# Patient Record
Sex: Male | Born: 1980 | Race: Black or African American | Hispanic: No | Marital: Single | Smoking: Never smoker
Health system: Southern US, Community
[De-identification: ages and names within clinical notes are randomized; demographics above are authoritative.]

## PROBLEM LIST (undated history)

## (undated) DIAGNOSIS — N052 Unspecified nephritic syndrome with diffuse membranous glomerulonephritis: Secondary | ICD-10-CM

## (undated) DIAGNOSIS — I10 Essential (primary) hypertension: Secondary | ICD-10-CM

## (undated) DIAGNOSIS — E119 Type 2 diabetes mellitus without complications: Secondary | ICD-10-CM

## (undated) DIAGNOSIS — N186 End stage renal disease: Secondary | ICD-10-CM

## (undated) DIAGNOSIS — I351 Nonrheumatic aortic (valve) insufficiency: Secondary | ICD-10-CM

## (undated) DIAGNOSIS — F32A Depression, unspecified: Secondary | ICD-10-CM

## (undated) DIAGNOSIS — R112 Nausea with vomiting, unspecified: Secondary | ICD-10-CM

## (undated) DIAGNOSIS — Z9889 Other specified postprocedural states: Secondary | ICD-10-CM

## (undated) DIAGNOSIS — I509 Heart failure, unspecified: Secondary | ICD-10-CM

## (undated) DIAGNOSIS — I428 Other cardiomyopathies: Secondary | ICD-10-CM

## (undated) DIAGNOSIS — N492 Inflammatory disorders of scrotum: Secondary | ICD-10-CM

## (undated) DIAGNOSIS — R079 Chest pain, unspecified: Secondary | ICD-10-CM

## (undated) DIAGNOSIS — D631 Anemia in chronic kidney disease: Secondary | ICD-10-CM

## (undated) DIAGNOSIS — D62 Acute posthemorrhagic anemia: Secondary | ICD-10-CM

## (undated) DIAGNOSIS — K219 Gastro-esophageal reflux disease without esophagitis: Secondary | ICD-10-CM

## (undated) DIAGNOSIS — L02214 Cutaneous abscess of groin: Secondary | ICD-10-CM

## (undated) DIAGNOSIS — E785 Hyperlipidemia, unspecified: Secondary | ICD-10-CM

## (undated) DIAGNOSIS — J45909 Unspecified asthma, uncomplicated: Secondary | ICD-10-CM

## (undated) DIAGNOSIS — Z8489 Family history of other specified conditions: Secondary | ICD-10-CM

## (undated) DIAGNOSIS — N189 Chronic kidney disease, unspecified: Secondary | ICD-10-CM

## (undated) HISTORY — DX: Chest pain, unspecified: R07.9

## (undated) HISTORY — DX: Acute posthemorrhagic anemia: D62

## (undated) HISTORY — DX: Essential (primary) hypertension: I10

## (undated) HISTORY — DX: Morbid (severe) obesity due to excess calories: E66.01

## (undated) HISTORY — DX: Anemia in chronic kidney disease: D63.1

## (undated) HISTORY — DX: Other cardiomyopathies: I42.8

## (undated) HISTORY — DX: Hyperlipidemia, unspecified: E78.5

## (undated) HISTORY — DX: Chronic kidney disease, unspecified: N18.9

## (undated) HISTORY — DX: Cutaneous abscess of groin: L02.214

## (undated) HISTORY — DX: Unspecified nephritic syndrome with diffuse membranous glomerulonephritis: N05.2

## (undated) HISTORY — DX: Type 2 diabetes mellitus without complications: E11.9

## (undated) HISTORY — DX: Inflammatory disorders of scrotum: N49.2

---

## 2004-02-02 ENCOUNTER — Emergency Department (HOSPITAL_COMMUNITY): Admission: EM | Admit: 2004-02-02 | Discharge: 2004-02-02 | Payer: Self-pay | Admitting: Family Medicine

## 2004-02-15 ENCOUNTER — Inpatient Hospital Stay (HOSPITAL_COMMUNITY): Admission: EM | Admit: 2004-02-15 | Discharge: 2004-02-18 | Payer: Self-pay | Admitting: Emergency Medicine

## 2004-02-26 ENCOUNTER — Encounter: Admission: RE | Admit: 2004-02-26 | Discharge: 2004-02-26 | Payer: Self-pay | Admitting: Internal Medicine

## 2004-03-16 ENCOUNTER — Emergency Department (HOSPITAL_COMMUNITY): Admission: EM | Admit: 2004-03-16 | Discharge: 2004-03-16 | Payer: Self-pay | Admitting: Internal Medicine

## 2004-03-29 ENCOUNTER — Encounter: Admission: RE | Admit: 2004-03-29 | Discharge: 2004-03-29 | Payer: Self-pay | Admitting: Internal Medicine

## 2004-06-20 ENCOUNTER — Ambulatory Visit: Payer: Self-pay | Admitting: Internal Medicine

## 2004-07-12 ENCOUNTER — Ambulatory Visit: Payer: Self-pay | Admitting: Internal Medicine

## 2005-06-30 ENCOUNTER — Inpatient Hospital Stay (HOSPITAL_COMMUNITY): Admission: EM | Admit: 2005-06-30 | Discharge: 2005-07-03 | Payer: Self-pay | Admitting: Emergency Medicine

## 2005-06-30 ENCOUNTER — Ambulatory Visit: Payer: Self-pay | Admitting: Infectious Diseases

## 2005-07-01 ENCOUNTER — Encounter (INDEPENDENT_AMBULATORY_CARE_PROVIDER_SITE_OTHER): Payer: Self-pay | Admitting: Cardiology

## 2006-06-07 ENCOUNTER — Emergency Department (HOSPITAL_COMMUNITY): Admission: EM | Admit: 2006-06-07 | Discharge: 2006-06-07 | Payer: Self-pay | Admitting: Emergency Medicine

## 2006-12-20 ENCOUNTER — Emergency Department (HOSPITAL_COMMUNITY): Admission: EM | Admit: 2006-12-20 | Discharge: 2006-12-20 | Payer: Self-pay | Admitting: Emergency Medicine

## 2008-08-19 ENCOUNTER — Emergency Department (HOSPITAL_COMMUNITY): Admission: EM | Admit: 2008-08-19 | Discharge: 2008-08-19 | Payer: Self-pay | Admitting: Emergency Medicine

## 2009-03-23 ENCOUNTER — Emergency Department (HOSPITAL_COMMUNITY): Admission: EM | Admit: 2009-03-23 | Discharge: 2009-03-24 | Payer: Self-pay | Admitting: Emergency Medicine

## 2010-12-15 LAB — DIFFERENTIAL
Basophils Absolute: 0 10*3/uL (ref 0.0–0.1)
Basophils Relative: 0 % (ref 0–1)
Eosinophils Absolute: 0.1 10*3/uL (ref 0.0–0.7)
Eosinophils Relative: 1 % (ref 0–5)
Lymphocytes Relative: 19 % (ref 12–46)
Lymphs Abs: 2.3 10*3/uL (ref 0.7–4.0)
Monocytes Absolute: 0.9 10*3/uL (ref 0.1–1.0)
Monocytes Relative: 7 % (ref 3–12)
Neutro Abs: 8.9 10*3/uL — ABNORMAL HIGH (ref 1.7–7.7)
Neutrophils Relative %: 73 % (ref 43–77)

## 2010-12-15 LAB — BASIC METABOLIC PANEL
BUN: 9 mg/dL (ref 6–23)
CO2: 25 mEq/L (ref 19–32)
Calcium: 9.1 mg/dL (ref 8.4–10.5)
Chloride: 101 mEq/L (ref 96–112)
Creatinine, Ser: 0.9 mg/dL (ref 0.4–1.5)
GFR calc Af Amer: >60 mL/min (ref >60)
GFR calc non Af Amer: >60 mL/min (ref >60)
Glucose, Bld: 386 mg/dL — ABNORMAL HIGH (ref 70–99)
Potassium: 3.8 mEq/L (ref 3.5–5.1)
Sodium: 135 mEq/L (ref 135–145)

## 2010-12-15 LAB — CBC
HCT: 44.7 % (ref 39.0–52.0)
Hemoglobin: 14.6 g/dL (ref 13.0–17.0)
MCHC: 32.8 g/dL (ref 30.0–36.0)
MCV: 81 fL (ref 78.0–100.0)
Platelets: 280 10*3/uL (ref 150–400)
RBC: 5.51 MIL/uL (ref 4.22–5.81)
RDW: 13.4 % (ref 11.5–15.5)
WBC: 12.2 10*3/uL — ABNORMAL HIGH (ref 4.0–10.5)

## 2010-12-21 ENCOUNTER — Inpatient Hospital Stay (INDEPENDENT_AMBULATORY_CARE_PROVIDER_SITE_OTHER)
Admission: RE | Admit: 2010-12-21 | Discharge: 2010-12-21 | Disposition: A | Discharge status: Home / Self Care | Payer: Self-pay | Source: Ambulatory Visit | Attending: Family Medicine | Admitting: Family Medicine

## 2010-12-21 DIAGNOSIS — L0231 Cutaneous abscess of buttock: Secondary | ICD-10-CM

## 2010-12-21 LAB — POCT URINALYSIS DIP (DEVICE)
Bilirubin Urine: NEGATIVE
Glucose, UA: 500 mg/dL — AB
Hgb urine dipstick: NEGATIVE
Protein, ur: NEGATIVE mg/dL
Specific Gravity, Urine: 1.02 (ref 1.005–1.030)
Urobilinogen, UA: 0.2 mg/dL (ref 0.0–1.0)

## 2010-12-21 LAB — POCT I-STAT, CHEM 8
BUN: 13 mg/dL (ref 6–23)
Calcium, Ion: 1.08 mmol/L — ABNORMAL LOW (ref 1.12–1.32)
Chloride: 98 mEq/L (ref 96–112)
Creatinine, Ser: 1.2 mg/dL (ref 0.4–1.5)
Glucose, Bld: 328 mg/dL — ABNORMAL HIGH (ref 70–99)
Hemoglobin: 16 g/dL (ref 13.0–17.0)
Potassium: 4.1 mEq/L (ref 3.5–5.1)
Sodium: 137 mEq/L (ref 135–145)
TCO2: 28 mmol/L (ref 0–100)

## 2010-12-21 LAB — GLUCOSE, CAPILLARY: Glucose-Capillary: 316 mg/dL — ABNORMAL HIGH (ref 70–99)

## 2010-12-24 ENCOUNTER — Inpatient Hospital Stay (HOSPITAL_COMMUNITY)
Admission: RE | Admit: 2010-12-24 | Discharge: 2010-12-24 | Disposition: A | Discharge status: Home / Self Care | Payer: Self-pay | Source: Ambulatory Visit | Attending: Family Medicine | Admitting: Family Medicine

## 2010-12-24 LAB — CULTURE, ROUTINE-ABSCESS

## 2011-01-18 ENCOUNTER — Inpatient Hospital Stay (INDEPENDENT_AMBULATORY_CARE_PROVIDER_SITE_OTHER)
Admission: RE | Admit: 2011-01-18 | Discharge: 2011-01-18 | Disposition: A | Discharge status: Home / Self Care | Payer: Self-pay | Source: Ambulatory Visit | Attending: Emergency Medicine | Admitting: Emergency Medicine

## 2011-01-18 ENCOUNTER — Emergency Department (HOSPITAL_COMMUNITY)
Admission: EM | Admit: 2011-01-18 | Discharge: 2011-01-19 | Disposition: A | Discharge status: Home / Self Care | Payer: Self-pay | Attending: Emergency Medicine | Admitting: Emergency Medicine

## 2011-01-18 DIAGNOSIS — E119 Type 2 diabetes mellitus without complications: Secondary | ICD-10-CM | POA: Insufficient documentation

## 2011-01-18 DIAGNOSIS — R1909 Other intra-abdominal and pelvic swelling, mass and lump: Secondary | ICD-10-CM | POA: Insufficient documentation

## 2011-01-18 DIAGNOSIS — I10 Essential (primary) hypertension: Secondary | ICD-10-CM | POA: Insufficient documentation

## 2011-01-18 DIAGNOSIS — R109 Unspecified abdominal pain: Secondary | ICD-10-CM | POA: Insufficient documentation

## 2011-01-18 DIAGNOSIS — L02219 Cutaneous abscess of trunk, unspecified: Secondary | ICD-10-CM | POA: Insufficient documentation

## 2011-01-19 LAB — DIFFERENTIAL
Basophils Absolute: 0 10*3/uL (ref 0.0–0.1)
Basophils Relative: 0 % (ref 0–1)
Eosinophils Absolute: 0 10*3/uL (ref 0.0–0.7)
Eosinophils Relative: 0 % (ref 0–5)
Lymphocytes Relative: 10 % — ABNORMAL LOW (ref 12–46)
Lymphs Abs: 1.8 10*3/uL (ref 0.7–4.0)
Neutro Abs: 15.1 10*3/uL — ABNORMAL HIGH (ref 1.7–7.7)
Neutrophils Relative %: 81 % — ABNORMAL HIGH (ref 43–77)

## 2011-01-19 LAB — CBC
Hemoglobin: 13 g/dL (ref 13.0–17.0)
MCH: 26 pg (ref 26.0–34.0)
MCV: 78.4 fL (ref 78.0–100.0)
Platelets: 222 10*3/uL (ref 150–400)
RBC: 5 MIL/uL (ref 4.22–5.81)
RDW: 14.1 % (ref 11.5–15.5)
WBC: 18.6 10*3/uL — ABNORMAL HIGH (ref 4.0–10.5)

## 2011-01-19 LAB — BASIC METABOLIC PANEL
BUN: 6 mg/dL (ref 6–23)
CO2: 24 mEq/L (ref 19–32)
Calcium: 9.2 mg/dL (ref 8.4–10.5)
Chloride: 98 mEq/L (ref 96–112)
GFR calc Af Amer: >60 mL/min (ref >60)
GFR calc non Af Amer: >60 mL/min (ref >60)
Glucose, Bld: 255 mg/dL — ABNORMAL HIGH (ref 70–99)
Potassium: 3.4 mEq/L — ABNORMAL LOW (ref 3.5–5.1)
Sodium: 133 mEq/L — ABNORMAL LOW (ref 135–145)

## 2011-01-21 NOTE — Op Note (Signed)
  NAMEPHYLLIS, Jeremy [name]             ACCOUNT NO.:  [id number]  MEDICAL RECORD NO.:  [id number]           PATIENT TYPE:  LOCATION:                                 FACILITY:  PHYSICIAN:  Joyice Faster. Samnang Shugars, M.D.DATE OF BIRTH:  Jan 08, 1981  DATE OF PROCEDURE:  01/19/2011 DATE OF DISCHARGE:                              OPERATIVE REPORT   PREOPERATIVE DIAGNOSIS:  Abscess, right groin.  POSTOPERATIVE DIAGNOSIS:  Abscess, right groin.  PROCEDURE:  Incision and drainage of abscess to right groin.  SURGEON:  Marcello Moores A. Kalaysia Demonbreun, MD  ANESTHESIA:  A 4 mg of Versed IV, 2 mg of morphine with conscious sedation with 10 mL of 2% lidocaine local.  ESTIMATED BLOOD LOSS:  Minimal.  SPECIMEN:  None.  INDICATIONS FOR PROCEDURE:  The patient is a 30 year old male who has had some swelling at the inner aspect of his right thigh at scrotal junction.  Upon examination, it was fluctuant and tender and this was red.  He had no evidence of inguinal hernias.  I recommended incision and drainage.  Ultrasound was done with EDP which showed multiloculated abscess in this area.  Under sterile conditions after getting informed consent from the patient, the skin was prepped with Betadine.  He received conscious sedation as outlined above.  A 2% lidocaine was used to inject into the skin.  A crease incision was made and copious amounts of pus and foul-smelling fluid were removed.  I then packed the cavity with quarter-inch Iodoform packing without difficulty.  Hemostasis was achieved.  He tolerated the procedure well.  There were no immediate complications.  He will follow up next week to have his pack removed in the office.     [name] A. Crixus Mcaulay, M.D.     [name]  D:  01/19/2011  T:  01/19/2011  Job:  [id number]  cc:   [facility]  Electronically Signed by Erroll Luna M.D. on 01/21/2011 01:46:25 PM

## 2011-01-21 NOTE — Consult Note (Signed)
  Jeremy [name], Jeremy [name]             ACCOUNT NO.:  [id number]  MEDICAL RECORD NO.:  [id number]           PATIENT TYPE:  E  LOCATION:  [facility]                         FACILITY:  [facility]  PHYSICIAN:  Tacoma Merida A. Sherol Sabas, M.D.DATE OF BIRTH:  1980/12/15  DATE OF CONSULTATION:  01/19/2011 DATE OF DISCHARGE:  01/19/2011                                CONSULTATION   REQUESTING PHYSICIAN:  Tiffany Kocher L. Eulis Foster, MD  CHIEF COMPLAINT:  Right groin swelling.  HISTORY OF PRESENT ILLNESS:  The patient is a 30 year old male with diabetes and hypertension.  He has had a 3-day history of progressive pain and swelling in his right groin where the scrotal skin attaches to the inner thigh.  It has been red, tender, swollen, uncomfortable and getting more so over last 24 hours.  He has had low-grade fever at home of 100.6.  No nausea or vomiting.  No arthralgias.  PAST MEDICAL HISTORY:  Diabetes and hypertension.  PAST SURGICAL HISTORY:  None.  FAMILY HISTORY:  Noncontributory.  SOCIAL HISTORY:  Positive for alcohol use.  Denies tobacco use.  REVIEW OF SYSTEMS:  Negative x15 points.  MEDICATIONS:  Lisinopril and glipizide.  ALLERGIES:  None.  PHYSICAL EXAMINATION:  VITAL SIGNS:  Temperature 100.6, blood pressure 152/96, heart rate 110. GENERAL APPEARANCE:  A pleasant male in no apparent distress. HEENT:  No jaundice.  Oropharynx moist. NECK:  Supple and nontender.  Full range of motion. GENITOURINARY:  Skin at the base of his right scrotum on the inner aspect is a 3-cm red, fluctuant, tender area.  I talk about this with the patient, recommend incision and drainage at bedside, and the patient agreed.  Risk of bleeding, infection, and need for further surgery and damage to neighboring structures.  IMPRESSION:  Right groin abscess.  PLAN:  We will plan I and D in the emergency room with conscious sedation.  Risks, benefits, and alternative therapies were discussed with the patient.  He understood  the above and wished to proceed.     Laasia Arcos A. Sadia Belfiore, M.D.     [name]  D:  01/19/2011  T:  01/19/2011  Job:  [id number]  cc:   [facility]  Electronically Signed by Erroll Luna M.D. on 01/21/2011 01:46:22 PM

## 2011-01-24 NOTE — Consult Note (Signed)
Jeremy [name], Jeremy [name]                         ACCOUNT NO.:  [id number]   MEDICAL RECORD NO.:  [id number]                   PATIENT TYPE:  EMS   LOCATION:  [facility]                                 FACILITY:  [facility]   PHYSICIAN:  Karol T. Erik Obey, M.D.              DATE OF BIRTH:  [birth year]   DATE OF CONSULTATION:  03/16/2004  DATE OF DISCHARGE:  03/16/2004                                   CONSULTATION   CHIEF COMPLAINT:  Severe sore throat.   HISTORY OF PRESENT ILLNESS:  A 30 year old diabetic black male developed a  sore throat roughly one week ago. For the past two to three days, it has  localized to the left side and increased in intensity significantly.  He has  pain with opening his mouth, pain with swallowing, and in fact prefers to  spit out his saliva.  He has some pain in his left ear but no change in  hearing, low-grade fever.  He claims he has not had anything to eat or drink  in the past two days.  He has no past history of strep throat, tonsillitis,  or peritonsillar abscess.  His diabetes was diagnosed one month ago but  reportedly is under fair to good control.  No other known immune compromise.  No bleeding problems.   PAST MEDICAL HISTORY:   ALLERGIES:  No known medical allergies.   MEDICATIONS:  He is taking insulin for diabetes.   SOCIAL HISTORY:  He works as a Development worker, international aid.   FAMILY HISTORY:  Noncontributory.   PHYSICAL EXAMINATION:  This is a tall, stocky, and somewhat overweight,  young adult black male.  He appears distressed.  He has hot potato voice,  significant fetor oris, and overall appears fatigued and uncomfortable.  Mental status is basically appropriate.  He hears well in conversational  speech.  He is breathing comfortably through his mouth.  Head is atraumatic  and neck supple. Cranial nerves intact.  Ear canals are clear with normal  aerated drums.  Anterior nose clear.  Oral cavity reveals some ropey saliva  with teeth in good repair.  He has a  bulky tongue.  He has slight trismus.  He has erythema without very much bulging of the left soft palate, but the  tonsil is displaced medially, and the uvula is also displaced with  significant translucent edema.  The right tonsil is 2+ and appears normal.  Did not examine nasopharynx or hypopharynx. Neck without adenopathy.  Appears slightly tender in the left jugular digastric region.   IMPRESSION:  Left peritonsillar abscess, probably deep given not much  superficial bulging but significant uvular displacement and edema.   PLAN:  With informed consent, gave the patient 2.4 million units of IV  __________ following which he received 2 mg of IV morphine and 2 mg of IV  Versed.  Cetacaine spray was used to achieve topical anesthesia of the  pharynx.  In  stages, 20 ml of 1% Xylocaine with 1:100,000 epinephrine was  infiltrated into the left peritonsillar area.  Several minutes were allowed  to elapse in order for this to take adequate effect.  The patient received  an additional 2 mg of IV morphine and 2 mg of IV Versed.  Following this, he  was still conscious and breathing spontaneously.  A 2 cm crescent incision  was made over the superior pole of the tonsil and carried down adjacent to  the tonsillar capsule.  No abscess cavity was encountered.  Through the  wound, a blunt tonsil hemostat was inserted, and an abscess pocket was  identified behind the mid pole of the tonsil and evacuated approximately 5  ml of foul-smelling pus.  The patient tolerated this remarkably well.  Hemostasis was spontaneous.   The patient was given prescription for oral amoxicillin liquid for 3 days  and then pills for 7 more days.  Also, a prescription for Tylenol with  codeine liquid and Roxicet liquid if needed.  He is okay to go home when his  IV conscious sedation protocol is completed.  He can advance diet as  comfortable, return to work as stamina allows.  I will see him back in my  office in two  days, sooner as needed.  He understands and agrees, as does  his girlfriend who accompanies him.                                               Marikay Alar. Erik Obey, M.D.    KTW/MEDQ  D:  03/16/2004  T:  03/17/2004  Job:  [id number]

## 2011-01-24 NOTE — Discharge Summary (Signed)
NAMETIGE, COSTILOW NO.:  [id number]   MEDICAL RECORD NO.:  [id number]          PATIENT TYPE:  INP   LOCATION:  [id number]                         FACILITY:  [facility]   PHYSICIAN:  Alison Murray, M.D.  DATE OF BIRTH:  Aug 21, 1981   DATE OF ADMISSION:  06/30/2005  DATE OF DISCHARGE:  07/03/2005                                 DISCHARGE SUMMARY   DISCHARGE DIAGNOSES:  1.  Diabetes mellitus, most likely type 2.  2.  Obesity.  3.  Status post ventricular tachycardia.  4.  Gastroesophageal reflux disease.   DISCHARGE MEDICATIONS:  1.  Prevacid 30 mg p.o. daily.  2.  Sotalol 80 mg p.o. b.i.d. to take until July 16, 2005, inclusively.  3.  K-Dur 40 mEq p.o. daily.  4.  Insulin 70/30, 25 units subcu b.i.d.  5.  Maalox 30 mL q.4h. p.r.n.   DISPOSITION AT DISCHARGE:  The patient was discharged on October 26th and  redirected to [facility] in [place] where he was therefore  hospitalized. I [facility] the [facility] before his discharge and  informed her of his situation. I advised the nurse to check the patient's  CBGs at least b.i.d. and to adjust the insulin dosage accordingly. I also  asked her to check a potassium level on July 05, 2005, as that is the day  of the week that a doctor goes to the jail and to adjust the K-Dur dosage as  needed, and then recheck a potassium level a  week later. He was also  discharged with sotalol to take for two weeks for runs of ventricular  tachycardia that he had in the hospital and this was to be stopped on  July 16, 2005.  He will be followed by the local doctor at [facility].   ADMITTING HISTORY AND PHYSICAL:  Mr. Chess is a 30 year old African  American man with past history of possible diabetes mellitus, type 1, who  was diagnosed two years prior secondary to an episode of DKA. He states he  had took insulin for eight months and then had stopped, which was about 15  months prior to  hospitalization because he had it under control. He is now  incarcerated in [facility]  since June 28, 2005. He was  complaining of nausea and vomiting, some shortness of breath as well as  right-sided pleuritic chest pain without cough or hemoptysis, not associated  with exercise, and that was almost constant. The patient was not radiating  and not associated with diaphoresis. The patient was also complaining of  right lower quadrant pain since October 21st, about 4/10, without diarrhea,  red blood per rectum, or melena, but states he has had no bowel movements  for four days. No fever or chills. No dysuria, hematuria, or urgency.  He  does state however that he has polyuria and polydipsia since October 21st  and feels sluggish. On physical exam, he had a temperature of 98.3, blood  pressure 147/86, pulse 96 to 124, respiratory rate 123, saturation of oxygen  99% on room air. General,  he was alert and oriented times three, but  slightly somnolent, obese man seemed to be in discomfort. Pupils were equal  and reactive to light. He had a dry oral mucosa with encrusted lips and poor  oral hygiene. His neck was supple without goiter or adenopathies. Lungs were  clear to auscultation bilaterally. He had a regular heart rate and rhythm,  but was tachycardic. His abdomen was soft. He had mild epigastric tenderness  and positive bowel sounds. He had no lower extremity edema. Pulses were  normal bilaterally. She did have not CVA tenderness and his skin exam was  normal.   LABORATORY DATA:  He had a white count of 17.9, ANC 16.7, hemoglobin 15.7,  platelet count 315,000. Sodium 132, potassium 4.8, chloride 108, CO2 5, BUN  28, creatinine 1.6, glucose over 700. This was a calculated anion gap of 19.  With a repeat BMET the anion gap was actually 25.  His liver enzymes were  all normal. On ABG he had a pH of 7.07, PCO2 11, PO2 142, and bicarbonate of  3.2. UA was positive for ketones and  glucose over 1000.  He had a serum  osmolality of 361, phosphorus 5.5, magnesium 2.5. CK total 15.5, troponin  0.02, and CK-MB of 1.4.  He had a small amount of blood acetone. The patient  was felt to be in diabetic ketoacidosis, so we rehydrated him aggressively  and started him on an insulin drip as well as repleted his potassium. By the  next evening he had closed his anion gap and his glucose was down to the  200s. So the insulin drip was stopped, and he was started on insulin 70/30  as we wanted to find the right dosage and this is all that was available at  the jail. His acute renal failure quickly improved as did the leukocytosis.  On October 24th he was complaining of that funny feeling in his chest and  his heart beating irregularly. On the monitor it was found that he had a few  runs of unsustained ventricular tachycardia. His potassium was repleted,  magnesium level was normal. A 2-D echo was checked which showed normal left  ventricular function with an ejection fraction of 55% to 65%. The study was  inadequate for the evaluation of left ventricular regional wall motion. He  was started on sotalol 80 mg p.o. b.i.d. and we kept him in the hospital a  few more days to see if he would respond to the medication well, which he  did. He had no other runs of ventricular tachycardia until discharge. He was  discharged on sotalol 80 mg b.i.d. and was to continue this for two weeks  and to be followed by the doctor at the jail.      Mohammed Kindle, M.D.    ______________________________  Alison Murray, M.D.    VD/MEDQ  D:  08/21/2005  T:  08/22/2005  Job:  [id number]

## 2011-01-24 NOTE — Discharge Summary (Signed)
Jeremy [name], Jeremy [name]                         ACCOUNT NO.:  [id number]   MEDICAL RECORD NO.:  [id number]                   PATIENT TYPE:  INP   LOCATION:  2902                                 FACILITY:  [facility]   PHYSICIAN:  C. Milta Deiters, M.D.             DATE OF BIRTH:  1981/08/06   DATE OF ADMISSION:  02/15/2004  DATE OF DISCHARGE:  02/18/2004                                 DISCHARGE SUMMARY   DISCHARGE DIAGNOSES:  1. Diabetic ketoacidosis in a new-onset type 2 diabetic.  2. Abnormal EKG.  3. Constipation.   DISCHARGE MEDICATIONS:  1. Glipizide 10 mg q.a.m.  2. 70/30 insulin 40 units before supper.  3. Peri-Colace 2 tablets twice daily until a bowel movement.  4. Lactulose 30 mL twice daily until bowel movements.  5. Aspirin 81 mg p.o. daily.   FOLLOWUP APPOINTMENT:  The patient will follow up in the outpatient clinic  in 1 weeks.  At that time, a BMET should be checked, looking primarily at  any evidence of anion gap and the glucose.  The patient should also be  assessed for any evidence of chest pain, given the fact that he had an  abnormal EKG during the hospital stay; this may need to be followed up  further in the outpatient setting.   PROCEDURES:  None.   CONSULTS:  None.   HISTORY OF PRESENT ILLNESS:  [age]-year-old African American male  with no significant past medical history, who presents to the emergency  department with chief complaint of feeling dehydrated with increased thirst,  polyuria for about a week.  The patient denied any abdominal pain.  He does  report some intermittent blurry vision but no double vision and some  generalized weakness.  He has not noticed any weight loss but has had a  decreased appetite for about 3 days.   ALLERGIES:  No known drug allergies.   SOCIAL HISTORY:  The patient has never smoked.  He does not drink alcohol or  use drugs or IV drugs.  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  He does live  with his girlfriend.  He has other forms of insurance.   FAMILY HISTORY:  Family history significant for mom and siblings with  diabetes mellitus, type 2.  His father has hypertension and coronary artery  disease.  He has one 52-year-old child.   REVIEW OF SYSTEMS:  Review of systems is significant for weight loss,  fatigue, chest pain, urinary frequency, abdominal pain in the midepigastric  area, polyuria, polydipsia, dizziness, diplopia.  No blurred vision or  vision changes.   PHYSICAL EXAM:  VITAL SIGNS:  Pulse 133 upon admission, blood pressure  146/95, temperature 98.1, respirations 20, O2 saturation 98% on room air.  Orthostatic showed a blood pressure of 132/68 with a heart rate of 124 while  lying and a blood pressure of  114/77 with a heart rate of 151 while  standing.  GENERAL:  Obese young black male with acetone breath.  EYES:  Sclerae were not icteric.  ENT:  Dry mucous membranes.  NECK:  Neck supple with no lymphadenopathy.  LUNGS:  Respirations clear to auscultation bilaterally.  CARDIOVASCULAR:  Tachycardic, regular rhythm.  GI:  Belly was soft, nontender and nondistended.  Positive bowel sounds.  EXTREMITIES:  No clubbing, cyanosis or edema.  LYMPH:  No apparent lymphadenopathy.  MUSCULOSKELETAL:  Muscle strength:  Moves all extremities x4.  NEUROLOGIC:  Nonfocal.   ADMISSION LABORATORIES:  Sodium was 123, potassium was 5.8, chloride was 83,  bicarb was 14, BUN was 26, creatinine was 1.9, glucose was 973 with a  calcium of 10.2.  Anion gap was calculated at 27.  White count was 14.3,  hemoglobin was 15.4, platelets was 413,000.  AST was 12.4.  The pH was 7.34,  PCO2 was 26.  Urine glucose was greater than 1000, trace hemoglobin, 40  ketones.   EKG showed T wave inversions in V3 to V6 and leads II, III and aVF.   HOSPITAL COURSE:  PROBLEM #1 - DIABETIC KETOACIDOSIS:  The patient came in  with an  anion gap of 27 with a bicarb of 14; this was somewhat of a mild  acidosis, considering that he had glucose greater than 900.  It was also  noted that the patient was very obese; he weighs 340 pounds and is 6 feet 5  inches.  There were no signs of a trigger; however, this was believed to be  secondary to DKA.  The patient was treated with IV fluids, Glucomander  insulin and had a normal BMET by the next morning.  He has a strong family  history of type 2 diabetes and it was believed that he may still be a type 2  diabetic and despite being in DKA upon admission.  He definitely had  characteristics to go more along with type 2 diabetes as opposed to type 1.  His insulin requirements in the hospital were quite high, indicating that he  was resistant to insulin.  We ultimately placed him on 70/30 insulin at  night and in the morning, however, it was decided that because he was likely  type 2 and that ultimately we would like him to be on oral medicines,  especially with some concern with compliance that we would give him night-  time insulin of 70/30 at suppertime and a.m. sulfonylurea dose which will  help with prandial sugar spikes.  Ultimately, if at all possible, it would  be nice to transition the patient to all oral medicines so that he will not  need to take insulin.  Upon leaving the hospital, he was on 10 mg  of  glipizide q.a.m., 40 units of 70/30 at suppertime.  He will be instructed to  check his sugars, at least fasting in the morning and hopefully around 5  p.m. in the evening, so that we can get an estimate of how well his sugar is  being controlled.  He will need to follow up in the outpatient clinic very  carefully and may need several visits to help get his sugars under control.  If the trial of oral hypoglycemic agents does not work, then he may need to  go on just insulin.  We will encourage the patient to diet and exercise and to eat the proper food concerning his  diabetes.  The patient was given a  starter kit in the hospital and diabetic teaching.   PROBLEM #2 - EKG CHANGES:  The patient was noted to have T wave inversions  in leads V3 to V6 and leads II, III and aVF.  We did __________  cardiology  about the EKG and they recommended cycling CK and CK-MBs without troponins,  which we did.  The patient's CK and CK-MBs were negative x3 and it was  decided that we would send him home.  The patient did not have any episodes  of chest pain, although he did have some episodes of tachycardia with  stress.  The patient may need further workup of his heart in the outpatient  setting.  He may even benefit from an exercise stress test at some point in  the future.   PROBLEM #3 - CONSTIPATION:  This became an issue for the patient midway into  the hospital stay.  He was sent home on Peri-Colace and lactulose to use as  needed.   DISCHARGE LABORATORIES:  Sodium 136, potassium 3.8, chloride 105, bicarb 24,  BUN 7, creatinine 1, glucose 325.  CK 152, 157 and 164.  CK-MB 1.1, 1.4 and  1.5.  Hemoglobin A1c 12.4.  TSH 1.022.  Magnesium 2.5, phosphorus 4.0.  UDS  negative.  Alcohol level less than 5.   DISCHARGE VITALS:  Temperature 99, blood pressure 100-130/45-80, heart rate  90-105, O2 saturations 99% on room air and breathing 20 times per minute.      Luane School, M.D.                      Renato Shin, M.D.    KC/MEDQ  D:  02/18/2004  T:  02/19/2004  Job:  [id number]   cc:   [facility]

## 2011-01-31 ENCOUNTER — Inpatient Hospital Stay (HOSPITAL_COMMUNITY)
Admission: AD | Admit: 2011-01-31 | Discharge: 2011-02-03 | DRG: 603 | Disposition: A | Discharge status: Home / Self Care | Payer: Self-pay | Source: Ambulatory Visit | Attending: General Surgery | Admitting: General Surgery

## 2011-01-31 DIAGNOSIS — I10 Essential (primary) hypertension: Secondary | ICD-10-CM | POA: Diagnosis present

## 2011-01-31 DIAGNOSIS — L02219 Cutaneous abscess of trunk, unspecified: Principal | ICD-10-CM | POA: Diagnosis present

## 2011-01-31 DIAGNOSIS — L03319 Cellulitis of trunk, unspecified: Principal | ICD-10-CM | POA: Diagnosis present

## 2011-01-31 DIAGNOSIS — E119 Type 2 diabetes mellitus without complications: Secondary | ICD-10-CM | POA: Diagnosis present

## 2011-01-31 LAB — CBC
Hemoglobin: 14.1 g/dL (ref 13.0–17.0)
MCH: 26.2 pg (ref 26.0–34.0)
MCHC: 33.7 g/dL (ref 30.0–36.0)
MCV: 77.7 fL — ABNORMAL LOW (ref 78.0–100.0)
Platelets: 374 10*3/uL (ref 150–400)
RBC: 5.38 MIL/uL (ref 4.22–5.81)
RDW: 14.2 % (ref 11.5–15.5)
WBC: 17.6 10*3/uL — ABNORMAL HIGH (ref 4.0–10.5)

## 2011-01-31 LAB — GLUCOSE, CAPILLARY
Glucose-Capillary: 301 mg/dL — ABNORMAL HIGH (ref 70–99)
Glucose-Capillary: 280 mg/dL — ABNORMAL HIGH (ref 70–99)
Glucose-Capillary: 292 mg/dL — ABNORMAL HIGH (ref 70–99)
Glucose-Capillary: 301 mg/dL — ABNORMAL HIGH (ref 70–99)
Glucose-Capillary: 262 mg/dL — ABNORMAL HIGH (ref 70–99)

## 2011-01-31 LAB — BASIC METABOLIC PANEL
BUN: 10 mg/dL (ref 6–23)
CO2: 28 mEq/L (ref 19–32)
Calcium: 9.3 mg/dL (ref 8.4–10.5)
Chloride: 97 mEq/L (ref 96–112)
GFR calc Af Amer: >60 mL/min (ref >60)
GFR calc non Af Amer: >60 mL/min (ref >60)
Glucose, Bld: 300 mg/dL — ABNORMAL HIGH (ref 70–99)
Potassium: 4 mEq/L (ref 3.5–5.1)
Sodium: 135 mEq/L (ref 135–145)

## 2011-01-31 LAB — MRSA PCR SCREENING: MRSA by PCR: NEGATIVE

## 2011-02-01 ENCOUNTER — Encounter: Payer: Self-pay | Admitting: Internal Medicine

## 2011-02-01 DIAGNOSIS — E119 Type 2 diabetes mellitus without complications: Secondary | ICD-10-CM

## 2011-02-01 LAB — GLUCOSE, CAPILLARY
Glucose-Capillary: 251 mg/dL — ABNORMAL HIGH (ref 70–99)
Glucose-Capillary: 237 mg/dL — ABNORMAL HIGH (ref 70–99)

## 2011-02-01 LAB — BASIC METABOLIC PANEL
Calcium: 9 mg/dL (ref 8.4–10.5)
Chloride: 101 mEq/L (ref 96–112)
Creatinine, Ser: 0.75 mg/dL (ref 0.4–1.5)
GFR calc Af Amer: >60 mL/min (ref >60)

## 2011-02-01 LAB — CBC
MCH: 25.5 pg — ABNORMAL LOW (ref 26.0–34.0)
Platelets: 345 10*3/uL (ref 150–400)
RBC: 5.09 MIL/uL (ref 4.22–5.81)
WBC: 13.5 10*3/uL — ABNORMAL HIGH (ref 4.0–10.5)

## 2011-02-01 NOTE — Progress Notes (Signed)
IM CONSULT NOTE   HPI: Patient is a 30 y/o man with h/o HTN, DM2, obesity and GERD who was admitted to the General Surgery service on 01/31/11 for evaluation of a left groin abscess now s/p I+d with wound cultures growing out both gram positive cocci in pairs and gram negative rods. IM service was consulted for management of poorly controlled diabetes and for outpatient PCP arrangement. Of note, he had presented about 2.5 weeks ago for a right groin abscess that had been I+d'ed s/p Doxycycline treatment, now stable but subsequently developed the left groin abscess. He is currently stable and afebrile and is on day 2 of IV Vanc and Zosyn. Patient does not currently have a PCP.  Outpatient Medications: Metformin 500mg  BID  Allergies: NKDA  PMH: HTN, DM2, GERD, obesity  Family History: Non contributory. Mother with diabetes.  Social History: Works as a Retail buyer. Positive for alcohol use, denies tobacco or illicit drug use.  ROS: As per HPI above  Physical Exam:  Vitals: t 98, p 84, rr 20, bp 112/77, o2 sat 100% RA General: Vital signs reviewed and noted. Well-developed, well-nourished, in no acute distress; alert, appropriate and cooperative throughout examination.  Head: Normocephalic, atraumatic.  Ears: TM nonerythematous, not bulging, good light reflex bilaterally.  Nose: Mucous membranes moist, not inflammed, nonerythematous.  Throat: Oropharynx nonerythematous, no exudate appreciated.   Neck: No deformities, masses, or tenderness noted.  Lungs:  Normal respiratory effort. Clear to auscultation BL without crackles or wheezes.  Heart: RRR. S1 and S2 normal without gallop, murmur, or rubs.  Abdomen:  BS normoactive. Soft, Nondistended, non-tender.  No masses or organomegaly.  Extremities: No pretibial edema.  Left groin wound clean, open, draining   Labs: WBC                                      13.5       h      4.0-10.5         K/uL  RBC                                      5.09               4.22-5.81        MIL/uL  Hemoglobin (HGB)                         13.0              13.0-17.0        g/dL  Hematocrit (HCT)                         39.7              39.0-52.0        %  MCV                                      78.0              78.0-100.0       fL  MCH -  25.5       l      26.0-34.0        pg  MCHC                                     32.7              30.0-36.0        g/dL  RDW                                      14.3              11.5-15.5        %  Platelet Count (PLT)                     345               150-400          K/uL  Sodium (NA)                              136               135-145          mEq/L  Potassium (K)                            4.1               3.5-5.1          mEq/L  Chloride                                 101               96-112           mEq/L  CO2                                      27                19-32            mEq/L  Glucose                                  253        h      70-99            mg/dL  BUN                                      12                6-23             mg/dL  Creatinine  0.75              0.4-1.5          mg/dL  GFR, Est Non African American            >60               >60              mL/min  GFR, Est African American                >60               >60              mL/min    Oversized comment, see footnote  1  Calcium                                  9.0               8.4-10.5         Mg/dL  Hemoglobin A1C                           13.2       h      4.6-6.1          %    Oversized comment, see footnote  1  Mean Plasma Glucose                      332        h                       Mg/dL  Assessment/Plan:  1. Type 2 Diabetes, poorly controlled - HbA1C of 13.2 - was on Metformin 500BID on outpatient basis.  - Patient refusing insulin right now and states his blood sugars were well controlled at home on just the oral antiglycemic.  Explained to the patient that oral meds alone will not be sufficient to lower his Hb A1C but patient is refusing any home insulin at this time. - For now, we will increase his Metformin to 1000mg  BID and add on Glipizide 10mg  qd in addition to sliding scale. Continue to follow CBGs. - arrange outpatient followup at the [facility] on Monday morning. He will also need a referral to the diabetes educator on follow-up at the clinic.  2. Left groin abscess s/p I+d - Stable, on Vanc + Zosyn - Percocet and Morphine for pain control per primary team - dispo per gen surgery  3. HTN - blood pressures currently stable on low dose ACEi.  4. DVT ppx - Lovenox

## 2011-02-02 LAB — VANCOMYCIN, TROUGH: Vancomycin Tr: 14.4 ug/mL (ref 10.0–20.0)

## 2011-02-02 LAB — DIFFERENTIAL
Basophils Absolute: 0 10*3/uL (ref 0.0–0.1)
Basophils Relative: 1 % (ref 0–1)
Neutro Abs: 4.1 10*3/uL (ref 1.7–7.7)
Neutrophils Relative %: 56 % (ref 43–77)

## 2011-02-02 LAB — BASIC METABOLIC PANEL
BUN: 11 mg/dL (ref 6–23)
GFR calc non Af Amer: >60 mL/min (ref >60)
Potassium: 3.8 mEq/L (ref 3.5–5.1)
Sodium: 136 mEq/L (ref 135–145)

## 2011-02-02 LAB — GLUCOSE, CAPILLARY
Glucose-Capillary: 267 mg/dL — ABNORMAL HIGH (ref 70–99)
Glucose-Capillary: 167 mg/dL — ABNORMAL HIGH (ref 70–99)

## 2011-02-02 LAB — CBC
Hemoglobin: 12.4 g/dL — ABNORMAL LOW (ref 13.0–17.0)
RBC: 4.79 MIL/uL (ref 4.22–5.81)

## 2011-02-03 LAB — GLUCOSE, CAPILLARY

## 2011-02-04 LAB — CULTURE, ROUTINE-ABSCESS

## 2011-02-04 NOTE — Op Note (Signed)
  NAMEJAVONTE, Jeremy [name]             ACCOUNT NO.:  [id number]  MEDICAL RECORD NO.:  [id number]           PATIENT TYPE:  I  LOCATION:  5118                         FACILITY:  [facility]  PHYSICIAN:  Merri Ray. Grandville Silos, M.D.DATE OF BIRTH:  1980/12/15  DATE OF PROCEDURE:  01/31/2011 DATE OF DISCHARGE:                              OPERATIVE REPORT   PREOPERATIVE DIAGNOSIS:  Left groin abscess.  POSTOPERATIVE DIAGNOSIS:  Left groin abscess.  PROCEDURE:  Incision and drainage, left groin abscess.  SURGEON:  Merri Ray. Grandville Silos, MD  ANESTHESIA:  General endotracheal.  HISTORY OF PRESENT ILLNESS:  Jeremy [name] is a 30 year old African American gentleman with a history of diabetes mellitus who underwent incision and drainage of a right-sided groin abscess by Dr. Erroll Luna from our practice in the emergency department a couple of weeks ago.  That area has been doing fine.  He was seen in the office today, however, because he developed a larger abscess on the other side over in his left groin.  Dr. Brantley Stage evaluated him in the office and determined this needed to be incised and drained in the operating room, so we admitted him to the hospital.  He has received intravenous antibiotics and we are proceeding with emergent drainage.  PROCEDURE IN DETAIL:  Informed consent was obtained.  The patient's site was marked after identifying him in the preop holding area.  We again used IV antibiotics.  He was brought to the operating room.  General endotracheal anesthesia was administered by the Anesthesia staff.  He was placed in the left-sided frog-leg position.  The area in his left groin and genitalia were all prepped and draped in a sterile fashion. There was spontaneous drainage from a hole in the lateral most part of the induration just lateral to his scrotum.  This was pink pus that was squirting out under pressure.  This was sent for aerobic and anaerobic cultures.  Next, the area of  induration was palpated and area of overlying skin was excised.  This exposed a large cavity containing this pink pus, some further unhealthy-appearing skin was also excised extending this opening.  It was in the shape of a very wide L.  All of the pockets were exposed.  The overlying skin was discarded.  The area was copiously irrigated with liberal amounts of warm saline.  Hemostasis was obtained with Bovie cautery and we packed it with saline-soaked Kling roll gauze followed by bulky sterile gauze, ABD pad, and mesh panties.  There was good hemostasis at the end of the case.  Sponge, needle, and instrument counts were all correct.  The patient tolerated the procedure well without apparent complication.  He was taken to the recovery room in stable condition.  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     Merri Ray Grandville Silos, M.D.     BET/MEDQ  D:  01/31/2011  T:  02/01/2011  Job:  [id number]  Electronically Signed by Georganna Skeans M.D. on 02/04/2011 01:16:51 PM

## 2011-02-04 NOTE — Discharge Summary (Signed)
[name], EMENS             ACCOUNT NO.:  [id number]  MEDICAL RECORD NO.:  [id number]           PATIENT TYPE:  I  LOCATION:  5118                         FACILITY:  [facility]  PHYSICIAN:  Fenton Malling. Lucia Gaskins, M.D.  DATE OF BIRTH:  [birth year]  DATE OF ADMISSION:  01/31/2011 DATE OF DISCHARGE:  02/03/2011                              DISCHARGE SUMMARY  DISCHARGE DIAGNOSES: 1. Left groin abscess. 2. Resolving right groin abscess. 3. Poorly controlled diabetes mellitus. 4. Hypertension.  OPERATIONS PERFORMED:  The patient had an incision and drainage of left groin abscess by Dr. Georganna Skeans on Jan 31, 2011.  HISTORY OF ILLNESS:  [name] is a 30 year old black gentleman who about 2-1/2 weeks prior to admission underwent an incision and drainage of a right groin abscess.  He then presented to our urgent clinic and Dr. Christie Beckers saw him on Jan 31, 2011, with a new left groin abscess. Dr. Brantley Stage thought that the abscess would best be managed in the operating room.  He was sent to [facility] where Dr. Georganna Skeans did an incision and drainage of this left groin abscess.  Of note, the patient was in prison until 3 months ago.  He takes poor care of his diabetes.  He was a former patient of the Medicine Teaching Service, but he can not remember the name of the physician who took care of him.  He was placed on vancomycin and Zosyn as antibiotics in the hospital. We have done twice a day dressing changes since the first postop day, and the incision has rapidly improved with good granulation tissue at the base.  While in the hospital, we checked a hemoglobin A1c on him which was 13.2.  At the same time, he had a glucose of 332, so he has poorly controlled diabetes mellitus.  I spoke to the medicine teaching service resident, Dr. Sheral Apley, who saw him in consultation to help manage both his in-hospital diabetes and arrange his diabetes care on discharge.  Interestingly,  though he has a history of hypertension, his blood pressure in the hospital off any medicines has good.  He is now ready for discharge.  His discharge instructions include being on a carb-modified diet for his diabetes.  He is to change his dressing twice a day and he has a girlfriend in the room who understands well both the necessity and the ability to manage his wound.  He is to see Dr. Georganna Skeans back in 7-10 days for wound check.   He is to be followed up in the [facility], the number is [phone], for followup of his diabetes.  By the Medicine Service, he is given glipizide 10 mg daily and metformin 1000 mg twice a day.  [Note, he is at this time refusing to be placed on Insulin for his DM.] I will give him Vicodin for pain, and I will give him 4 more days of Septra DS for a total of 7 days of antibiotics.  DISCHARGE CONDITION:  Good.   Fenton Malling. Lucia Gaskins, M.D., FACS   DHN/MEDQ  D:  02/03/2011  T:  02/03/2011  Job:  [id number]  cc:   Sheral Apley, MD  Electronically Signed by Alphonsa Overall M.D. on 02/04/2011 08:49:45 AM

## 2011-02-05 LAB — ANAEROBIC CULTURE

## 2011-02-05 NOTE — H&P (Signed)
  Jeremy [name], Jeremy [name]             ACCOUNT NO.:  [id number]  MEDICAL RECORD NO.:  [id number]           PATIENT TYPE:  I  LOCATION:  5118                         FACILITY:  [facility]  PHYSICIAN:  Joyice Faster. Kaimen Peine, M.D.DATE OF BIRTH:  [birth year]  DATE OF ADMISSION:  01/31/2011 DATE OF DISCHARGE:                             HISTORY & PHYSICAL   CHIEF COMPLAINT:  Swelling, left groin.  HISTORY OF PRESENT ILLNESS:  The patient is a 30 year old male who 2-1/2 weeks ago was in the emergency room for a right groin abscess located at the base of his scrotum and inner thigh.  This was I and D'd and he has done well from that.  He has been maintaining on doxycycline.  He returns to the office today for followup.  Over the last week, he developed more swelling and fullness in the left groin.  The right groin actually has improved and he has no further pain there.  There has been some drainage from the right groin, this is minimal.  Denies any fever or chills.  There is redness, discomfort which is 5/10 in the left groin and swelling.  PAST MEDICAL HISTORY: 1. Diabetes. 2. Hypertension.  PAST SURGICAL HISTORY:  See above.  MEDICATIONS:  Doxycycline.  ALLERGIES:  None.  PHYSICAL EXAMINATION:  GENERAL APPEARANCE:  Male, in no apparent distress. HEENT:  No jaundice. NECK:  Supple, nontender.  Trachea midline. PULMONARY:  Lung sounds are clear.  Chest wall motion normal. CARDIOVASCULAR:  Regular rate and rhythm without rub, murmur, or gallop. EXTREMITIES:  No clubbing, cyanosis nor edema. SKIN:  In the left groin, he has redness, fluctuance and discomfort measuring 5-6 cm involving the inner aspect of his left thigh and base of the left hemiscrotum.  It is fluctuant and very tender.  IMPRESSION: 1. Resolved right groin abscess. 2. New large left groin abscess. 3. Diabetes mellitus type 2. 4. Hypertension.  PLAN:  He will be sent to [facility] for the treatment which will include  an incision and drainage.  I have discussed this with the patient.     Sy Saintjean A. Leontine Radman, M.D.     [name]  D:  01/31/2011  T:  02/01/2011  Job:  [id number]  Electronically Signed by Erroll Luna M.D. on 02/05/2011 07:35:11 AM

## 2011-02-07 ENCOUNTER — Telehealth: Payer: Self-pay | Admitting: Internal Medicine

## 2011-02-07 ENCOUNTER — Encounter: Payer: Self-pay | Admitting: Internal Medicine

## 2011-02-07 NOTE — Telephone Encounter (Signed)
HFU for diabetes management. Needs referral to [name] for education. Needs to be started on insulin.

## 2011-02-11 ENCOUNTER — Encounter: Payer: Self-pay | Admitting: Internal Medicine

## 2011-02-11 NOTE — Progress Notes (Signed)
Discharge Update  Pt is a 30 y/o man that was admitted by the [facility] service for management of a left groin abscess for which he is s/p I+D on Jan 31, 2011. The IMTS was consulted for diabetes management and with help to establish a PCP. The patient was found to have poorly controlled DM2 with a HbAIC of 13.2. He was on Metformin 500mg  BID on an outpatient basis but we increased this to 1000mg  BID and added on Glipizide 10mg  qd. However, patient stated that he did not want to use insulin on an outpatient basis and we clearly explained to the patient that oral antiglycemics alone will not sufficiently lower his A1C to goal. This will need to be readdressed with the patient at his follow-up visit at the clinic as he clearly requires insulin for better DM control. He will also need a referral to [name] for diabetes education.

## 2011-02-20 ENCOUNTER — Ambulatory Visit (INDEPENDENT_AMBULATORY_CARE_PROVIDER_SITE_OTHER): Payer: Self-pay | Admitting: Internal Medicine

## 2011-02-20 ENCOUNTER — Encounter: Payer: Self-pay | Admitting: Internal Medicine

## 2011-02-20 ENCOUNTER — Telehealth: Payer: Self-pay | Admitting: *Deleted

## 2011-02-20 VITALS — BP 124/85 | HR 87 | Temp 97.1°F | Ht 76.0 in | Wt 291.9 lb

## 2011-02-20 DIAGNOSIS — L02214 Cutaneous abscess of groin: Secondary | ICD-10-CM

## 2011-02-20 DIAGNOSIS — L02219 Cutaneous abscess of trunk, unspecified: Secondary | ICD-10-CM

## 2011-02-20 DIAGNOSIS — E119 Type 2 diabetes mellitus without complications: Secondary | ICD-10-CM

## 2011-02-20 DIAGNOSIS — E1121 Type 2 diabetes mellitus with diabetic nephropathy: Secondary | ICD-10-CM | POA: Insufficient documentation

## 2011-02-20 MED ORDER — LISINOPRIL 5 MG PO TABS: 5.0000 mg | ORAL_TABLET | Freq: Every day | ORAL | Status: DC

## 2011-02-20 NOTE — Assessment & Plan Note (Signed)
Resolving. No additional antibiotics given. Asked to continued wet to dry dressing

## 2011-02-20 NOTE — Telephone Encounter (Signed)
On June 1, phone call was rec'd by Garlan Fillers.  A woman on the other end was upset because her boyfriend (the patient) had not rec'd a phone call from the clinic in regards to a new patient appointment.  Ivin Booty tried to explain to her that we did not receive a message about giving him an appointment as of yet.  She became very upset and began screaming at Ivin Booty that we have incompetent staff working in this clinic, and that if she did not want to be here she needs to find a new job. Ivin Booty then gave the phone to me and I asked the lady to put the patient on the phone so that we could help him.  She gave the patient the phone and continued to scream in the background that he will be seen today.  After speaking with the patient I found that he was a HFU patient and told him that we would call him back after contacting the discharging resident and set up an appointment.  I also conveyed that we had not gotten a message to schedule him and that we were sorry for any miscommunication and that  I would call him back with an appointment.  After receiving the okay from Dr. Donnel Saxon called the patient back on June 4, and left a message about the appointment on 02/20/2011 at 2:15pm with Dr. Manuella Ghazi.  Also made Susette Racer aware with Illene Regulus of this matter.

## 2011-02-20 NOTE — Progress Notes (Signed)
  Subjective:    Patient ID: Jeremy [name], male    DOB: 11-26-1980, 30 y.o.   MRN: [id number]  HPI Patient is a 30 year old male who is new to the clinic and recently treated in the hospital for a left leg abscess. He is known to be diabetic with A1c greater than 13 but only using oral medications. He refused to take insulin when he was advised in the hospital. Patient claimed that his diabetes is well controlled at home.  He does not have meter so he has not checked his sugars at home. His metformin dose was doubled at the discharge, and he was started on glipizide. His abscess site has healed well. His partner is applying wet to dry dressing, NO fevers, nausea or vomiting reported. He finished 7 days of oral doxy in addition to 3 days of iv antibiotics he received in the hospital.   Review of Systems  All other systems reviewed and are negative.       Objective:   Physical Exam  Constitutional: He is oriented to person, place, and time. He appears well-developed and well-nourished.  HENT:  Head: Normocephalic and atraumatic.  Right Ear: External ear normal.  Left Ear: External ear normal.  Eyes: Conjunctivae and EOM are normal. Pupils are equal, round, and reactive to light. Right eye exhibits no discharge. Left eye exhibits no discharge.  Neck: Normal range of motion. Neck supple. No thyromegaly present.  Cardiovascular: Normal rate and regular rhythm.   No murmur heard. Pulmonary/Chest: Effort normal and breath sounds normal. No respiratory distress. He has no wheezes. He has no rales.  Abdominal: Soft. Bowel sounds are normal. He exhibits no distension and no mass. There is no tenderness. There is no rebound and no guarding.  Musculoskeletal: Normal range of motion.  Neurological: He is alert and oriented to person, place, and time. He has normal reflexes. No cranial nerve deficit. Coordination normal.  Skin: No rash noted. He is not diaphoretic. No erythema.       3 cm ulcer at  the abscess site in the left groin area, no discharge, healthy looking base, minimal tenderness, no lymphadenopathy  Psychiatric: He has a normal mood and affect. His behavior is normal. Judgment and thought content normal.          Assessment & Plan:

## 2011-02-20 NOTE — Patient Instructions (Signed)
Follow up in 2 weeks. Follow up with [name] for diabetic education and glucometer Follow up with [facility] for orange card.

## 2011-02-20 NOTE — Assessment & Plan Note (Signed)
Patient appears to be doing well with oral hypoglycemic but it seems unlikely that he will achieve his A1C goal solely with diet, exercise and oral agents. At the same time, without supplies, he has not been able to check his sugars and his CBG in the clinic is only 126. His regular insulin need in the hospital when started with oral agents was about 10-15 units per day. At this time, I will simply advise him to acquire glucometer and test fasting and post prandial sugars every day for two weeks and return to see me with those numbers. We will consider starting 70/30 insulin at that point. Patient is amenable to it. I spend >30 minutes educating the patient about diabetes, its potential effects on end organs etc. He was on enalapril 5 mg when he worked at [employer] facility, likely to protect his kidenys. I will restart him on the same.

## 2011-03-10 ENCOUNTER — Encounter: Payer: Self-pay | Admitting: Internal Medicine

## 2011-03-21 ENCOUNTER — Encounter: Payer: Self-pay | Admitting: Internal Medicine

## 2011-03-21 ENCOUNTER — Ambulatory Visit (INDEPENDENT_AMBULATORY_CARE_PROVIDER_SITE_OTHER): Payer: Self-pay | Admitting: Internal Medicine

## 2011-03-21 VITALS — BP 125/86 | HR 91 | Temp 97.7°F | Ht 78.5 in | Wt 294.6 lb

## 2011-03-21 DIAGNOSIS — L02214 Cutaneous abscess of groin: Secondary | ICD-10-CM

## 2011-03-21 DIAGNOSIS — L03319 Cellulitis of trunk, unspecified: Secondary | ICD-10-CM

## 2011-03-21 DIAGNOSIS — K219 Gastro-esophageal reflux disease without esophagitis: Secondary | ICD-10-CM

## 2011-03-21 DIAGNOSIS — I10 Essential (primary) hypertension: Secondary | ICD-10-CM

## 2011-03-21 DIAGNOSIS — E119 Type 2 diabetes mellitus without complications: Secondary | ICD-10-CM

## 2011-03-21 DIAGNOSIS — E669 Obesity, unspecified: Secondary | ICD-10-CM

## 2011-03-21 DIAGNOSIS — L02219 Cutaneous abscess of trunk, unspecified: Secondary | ICD-10-CM

## 2011-03-21 DIAGNOSIS — E66811 Obesity, class 1: Secondary | ICD-10-CM | POA: Insufficient documentation

## 2011-03-21 LAB — POCT GLYCOSYLATED HEMOGLOBIN (HGB A1C): Hemoglobin A1C: 8.7

## 2011-03-21 LAB — LIPID PANEL: HDL: 36 mg/dL — ABNORMAL LOW (ref >39)

## 2011-03-21 LAB — GLUCOSE, CAPILLARY: Glucose-Capillary: 164 mg/dL — ABNORMAL HIGH (ref 70–99)

## 2011-03-21 NOTE — Assessment & Plan Note (Addendum)
No c/o. His HbA1c is 8.7 today and BGM 164 (fasting) - instructed to continue the current regimen. -will arrange the appointment with [name] in next 1-2 weeks Butch Penny has been following up with this pt). - Instructed pt on lifestyle changes -check Urine Microalbumin/Cr ratio today. -check lipid panel today since he has not eaten anything.

## 2011-03-21 NOTE — Assessment & Plan Note (Signed)
Resolved. No c/o.

## 2011-03-21 NOTE — Assessment & Plan Note (Signed)
No c/o of any heart burn or pain.

## 2011-03-21 NOTE — Assessment & Plan Note (Signed)
Pt BMI 33.61 today. I spent at least 30 minutes talking to pt about this problem. - education on the importance of weight loss. -education about the effects on DM and HTN management. -set up a long term goal to encourage pt to loss weight.      We have discussed about the diet control and exercise plan, and set up a goal of losing 5lbs/month.  Pt is very excited to participate the weight loss to further management of  his DM and blood pressure.

## 2011-03-21 NOTE — Patient Instructions (Signed)
1. Please schedule an appointment with [name] in next 1-2 weeks. 2. Please consider lifestyle changes on your diet and exercise. 3. Please set up a goal for weight loss, ideally loss 5 lbs a month. 4. Your Hb A1C is 8.7 today. Please check your blood sugar twice daily and bring the meter to the office during next visit. 5. Please see me in 3-4 weeks.

## 2011-03-21 NOTE — Assessment & Plan Note (Signed)
Resolved, No c/o.

## 2011-03-21 NOTE — Assessment & Plan Note (Signed)
Pt is on low dose lisinopril with fairly good control on his BP.  -education on low salt diet. -Last BMP WNL in May of this year. -will follow up with his renal function next visit.

## 2011-03-21 NOTE — Progress Notes (Signed)
  Subjective:    Patient ID: Jeremy [name], male    DOB: 06-21-1981, 30 y.o.   MRN: [id number]  HPI  This is a 30 yo pleasant gentleman with past medical history of DM type II, HTN, obesity and bilateral groin abscesses (resolved) presents to the clinic for the followup visit.   Pt had a h/o poorly controlled DM, Type II with Bilateral Groin abscesses in May, subsequently he was managed by surgical service at the time.  His HbA1c was 13.2 on 02/01/11.     His HbA1C rechecked to be 8.7 today since the medication adjustments. Pt no c/o today and stated that he has been complaint with his medications.[name] BGM 164. Jeremy [name] has seen pt for the DM management.   I spent over 30 minutes on education of DM and HTN management, and strongly encourage pt to loss weight.  Bilateral Groin abcesses resolved.  Past Medical History  Diagnosis Date  . Abscess of left groin     s/p incision and drainage  . Diabetes mellitus     Uncontrolled A1C 5/12 13   Current Outpatient Prescriptions on File Prior to Visit  Medication Sig Dispense Refill  . glipiZIDE (GLUCOTROL) 10 MG tablet Take 10 mg by mouth 2 (two) times daily before a meal.        . lisinopril (PRINIVIL,ZESTRIL) 5 MG tablet Take 1 tablet (5 mg total) by mouth daily.  30 tablet  11  . metFORMIN (GLUCOPHAGE) 1000 MG tablet Take 1,000 mg by mouth 2 (two) times daily with a meal.         No Known Allergies   Review of Systems  No headache, fever, or sore throat. No shortness of breath or dyspnea on exertion. No chest pain, chest pressure or palpitation.  No nausea, vomiting, or abdominal pain. No melena, diarrhea or incontinence. No muscle weakness.                   Denies depression. No appetite or weight changes.      Objective:   Physical Exam General: alert, well-developed, and cooperative to examination.  Eye: vision grossly intact. Mouth: pharynx pink and moist. Lungs: normal respiratory effort, no accessory muscle use, normal breath  sounds, no crackles, and no wheezes. Heart: normal rate, regular rhythm, no murmur, no gallop, and no rub.  Abdomen: soft, non-tender, normal bowel sounds, no distention, no guarding, no rebound tenderness, no hepatomegaly, and no splenomegaly.  Msk: no joint swelling, no joint warmth, and no redness over joints.  Pulses: 2+ DP/PT pulses bilaterally Extremities: No cyanosis, clubbing, edema Neurologic: alert & oriented X3, cranial nerves II-XII intact, strength normal in all extremities, sensation intact to light touch, and gait normal.  Skin: turgor normal and no rashes.  Psych: Oriented X3, memory intact for recent and remote, normally interactive, good eye contact, not anxious appearing, and not depressed appearing.         Assessment & Plan:

## 2011-03-22 LAB — MICROALBUMIN / CREATININE URINE RATIO
Creatinine, Urine: 486.1 mg/dL
Microalb Creat Ratio: 25.4 mg/g (ref 0.0–30.0)
Microalb, Ur: 12.35 mg/dL — ABNORMAL HIGH (ref 0.00–1.89)

## 2011-04-14 ENCOUNTER — Ambulatory Visit: Payer: Self-pay | Admitting: Dietician

## 2011-04-14 ENCOUNTER — Encounter: Payer: Self-pay | Admitting: Internal Medicine

## 2011-04-21 ENCOUNTER — Encounter: Payer: Self-pay | Admitting: Internal Medicine

## 2011-04-21 ENCOUNTER — Ambulatory Visit: Payer: Self-pay | Admitting: Dietician

## 2011-10-02 ENCOUNTER — Encounter: Payer: Self-pay | Admitting: Internal Medicine

## 2012-01-12 ENCOUNTER — Encounter: Payer: Self-pay | Admitting: Internal Medicine

## 2012-01-14 ENCOUNTER — Encounter: Payer: Self-pay | Admitting: Internal Medicine

## 2012-01-14 ENCOUNTER — Ambulatory Visit (INDEPENDENT_AMBULATORY_CARE_PROVIDER_SITE_OTHER): Payer: Self-pay | Admitting: Internal Medicine

## 2012-01-14 VITALS — BP 139/90 | HR 83 | Temp 97.1°F | Ht 77.0 in | Wt 322.6 lb

## 2012-01-14 DIAGNOSIS — I10 Essential (primary) hypertension: Secondary | ICD-10-CM

## 2012-01-14 DIAGNOSIS — E119 Type 2 diabetes mellitus without complications: Secondary | ICD-10-CM

## 2012-01-14 DIAGNOSIS — Z79899 Other long term (current) drug therapy: Secondary | ICD-10-CM

## 2012-01-14 LAB — LIPID PANEL
LDL Cholesterol: 141 mg/dL — ABNORMAL HIGH (ref 0–99)
Triglycerides: 148 mg/dL (ref <150)
VLDL: 30 mg/dL (ref 0–40)

## 2012-01-14 LAB — POCT GLYCOSYLATED HEMOGLOBIN (HGB A1C): Hemoglobin A1C: 8

## 2012-01-14 MED ORDER — GLIPIZIDE 10 MG PO TABS: 10.0000 mg | ORAL_TABLET | Freq: Two times a day (BID) | ORAL | Status: DC

## 2012-01-14 MED ORDER — METFORMIN HCL 1000 MG PO TABS: 1000.0000 mg | ORAL_TABLET | Freq: Two times a day (BID) | ORAL | Status: DC

## 2012-01-14 MED ORDER — LISINOPRIL 5 MG PO TABS: 5.0000 mg | ORAL_TABLET | Freq: Every day | ORAL | Status: DC

## 2012-01-14 NOTE — Assessment & Plan Note (Addendum)
Patient's A1c above goal at 8.7 in July of 2012. Patient does not have meter with him today for review. Will repeat hemoglobin A1c today.  Suspect patient may not be fully compliant with his current regimen. We'll check a lipid profile today.  He is advised to return in 3 months for followup A1c. He may need to begin insulin therapy if his A1c is medical that time.

## 2012-01-14 NOTE — Progress Notes (Signed)
Patient ID: Jeremy [name], male   DOB: 1981-08-22, 31 y.o.   MRN: [id number] Subjective:     HPI: Patient is a 31 y/o M presents today for follow up and medication refill.  He has been out of meds for ~2 weeks.  He reports close to 100% compliance with all meds. He denies any adverse effects from any medication.   He has no other complaints or concerns today.    Review of Systems Constitutional: Negative for fever, chills, diaphoresis, activity change, appetite change, fatigue and unexpected weight change.  HENT: Negative for hearing loss, congestion and neck stiffness.   Eyes: Negative for photophobia, pain and visual disturbance.  Respiratory: Negative for cough, chest tightness, shortness of breath and wheezing.   Cardiovascular: Negative for chest pain and palpitations.  Gastrointestinal: Negative for abdominal pain, blood in stool and anal bleeding.  Genitourinary: Negative for dysuria, hematuria and difficulty urinating.  Musculoskeletal: Negative for joint swelling.  Neurological: Negative for dizziness, syncope, speech difficulty, weakness, numbness and headaches. ]     Objective:   Physical Exam VItal signs reviewed and stable. GEN: No apparent distress.  Alert and oriented x 3.  Pleasant, conversant, and cooperative to exam. HEENT: head is autraumatic and normocephalic.  Neck is supple without palpable masses or lymphadenopathy.  No JVD or carotid bruits.  Vision intact.  EOMI.  PERRLA.  Sclerae anicteric.  Conjunctivae without pallor or injection. Mucous membranes are moist.  Oropharynx is without erythema, exudates, or other abnormal lesions.   RESP:  Lungs are clear to ascultation bilaterally with good air movement.  No wheezes, ronchi, or rubs. CARDIOVASCULAR: regular rate, normal rhythm.  Clear S1, S2, no murmurs, gallops, or rubs. ABDOMEN: soft, non-tender, non-distended.  Bowels sounds present in all quadrants and normoactive.  No palpable masses. EXT: warm and dry.   Peripheral pulses equal, intact, and +2 globally.  No clubbing or cyanosis.  No edema in bilateral lower extremities. SKIN: warm and dry with normal turgor.  No rashes or abnormal lesions observed. NEURO: CN II-XII grossly intact.  Muscle strength +5/5 in bilateral upper and lower extremities.  Sensation is grossly intact.  No focal deficit.     Assessment/Plan:

## 2012-01-14 NOTE — Assessment & Plan Note (Addendum)
Blood pressure elevated above goal today, likely reflecting lack of medication for the past 2 weeks.  Will defer increasing to 10 mg lisinopril daily. Patient advised to record home blood pressure measurements and bring log with him to his next office visit. His fiance as a CNA and states she will be able to perform regular blood pressure checks.  We will review blood pressure readings and check basic metabolic call at next office visit.

## 2012-01-14 NOTE — Patient Instructions (Addendum)
Schedule a followup appointment with her primary care provider in 2-3 months. Continue to take all of your medicines every day as directed.  This is important! Take your blood pressure at home and record the numbers. Bring the log of her blood pressure readings with you to your next appointment. Also bring your blood sugar meter with you to your next appointment. Call the clinic at [phone] with any concerns or questions. I will contact you if your lab work is abnormal.

## 2012-02-27 ENCOUNTER — Encounter (HOSPITAL_COMMUNITY): Payer: Self-pay | Admitting: Emergency Medicine

## 2012-02-27 ENCOUNTER — Emergency Department (HOSPITAL_COMMUNITY)
Admission: EM | Admit: 2012-02-27 | Discharge: 2012-02-27 | Disposition: A | Discharge status: Home / Self Care | Payer: Self-pay | Attending: Emergency Medicine | Admitting: Emergency Medicine

## 2012-02-27 DIAGNOSIS — E119 Type 2 diabetes mellitus without complications: Secondary | ICD-10-CM | POA: Insufficient documentation

## 2012-02-27 DIAGNOSIS — F17200 Nicotine dependence, unspecified, uncomplicated: Secondary | ICD-10-CM | POA: Insufficient documentation

## 2012-02-27 DIAGNOSIS — L02219 Cutaneous abscess of trunk, unspecified: Secondary | ICD-10-CM | POA: Insufficient documentation

## 2012-02-27 DIAGNOSIS — L0291 Cutaneous abscess, unspecified: Secondary | ICD-10-CM

## 2012-02-27 MED ORDER — OXYCODONE-ACETAMINOPHEN 5-325 MG PO TABS
1.0000 | ORAL_TABLET | Freq: Once | ORAL | Status: AC
  Administered 2012-02-27: 1 via ORAL
  Filled 2012-02-27: qty 1

## 2012-02-27 MED ORDER — CIPROFLOXACIN HCL 500 MG PO TABS: 500.0000 mg | ORAL_TABLET | Freq: Two times a day (BID) | ORAL | Status: AC

## 2012-02-27 MED ORDER — OXYCODONE-ACETAMINOPHEN 5-325 MG PO TABS: 2.0000 | ORAL_TABLET | ORAL | Status: AC | PRN

## 2012-02-27 NOTE — Discharge Instructions (Signed)
Keep your wound clean and dry.  He may wash it with soap and water.  Change the packing daily until the wound closes.  Use Cipro to prevent infection and Percocet for pain.  Followup with your Dr. as needed.  Return for fevers, or spreading pain, and redness.

## 2012-02-27 NOTE — ED Provider Notes (Signed)
History   This chart was scribed for Barbara Cower, MD by Kathreen Cornfield. The patient was seen in room TR11C/TR11C and the patient's care was started at 11:27 AM     CSN: [id number]  Arrival date & time 02/27/12  1013   First MD Initiated Contact with Patient 02/27/12 1124      Chief Complaint  Patient presents with  . Abscess    (Consider location/radiation/quality/duration/timing/severity/associated sxs/prior treatment) Patient is a 31 y.o. male presenting with abscess. The history is provided by the patient. No language interpreter was used.  Abscess  This is a new problem. The current episode started less than one week ago. The problem occurs rarely. The problem has been unchanged. The abscess is present on the groin. The problem is moderate. Pertinent negatives include no fever, no diarrhea, no vomiting and no cough.    Pt denies any allergies to medications.   PCP is the outpatient clinic.     Past Medical History  Diagnosis Date  . Abscess of left groin     s/p incision and drainage  . Diabetes mellitus     Uncontrolled A1C 5/12 13     History  Substance Use Topics  . Smoking status: Former Smoker    Types: Cigarettes    Quit date: 02/19/2005  . Smokeless tobacco: Not on file  . Alcohol Use: Not on file      Review of Systems  Constitutional: Negative for fever.  Respiratory: Negative for cough.   Gastrointestinal: Negative for vomiting and diarrhea.    Allergies  Review of patient's allergies indicates no known allergies.  Home Medications   Current Outpatient Rx  Name Route Sig Dispense Refill  . IBUPROFEN 200 MG PO TABS Oral Take 600 mg by mouth every 6 (six) hours as needed. As needed for pain.    [name] LISINOPRIL 5 MG PO TABS Oral Take 5 mg by mouth daily.    [name] METFORMIN HCL 1000 MG PO TABS Oral Take 1 tablet (1,000 mg total) by mouth 2 (two) times daily with a meal. 60 tablet 3    BP 135/93  Pulse 99  Temp 98.4 F (36.9 C) (Oral)  Resp 14   SpO2 99%  Physical Exam  Nursing note and vitals reviewed. Constitutional: He appears well-developed and well-nourished.  Genitourinary: Penis normal. No penile tenderness.       3 cm indurated abscess on left perineal area. Testicles and penis are normal.   Skin: Skin is warm and dry. No rash noted. No erythema.  Psychiatric: He has a normal mood and affect. His behavior is normal.    ED Course  Procedures (including critical care time)  DIAGNOSTIC STUDIES: Oxygen Saturation is 99% on room air, normal by my interpretation.    COORDINATION OF CARE:  PROCEDURE I&D abscess Verbal consent obtained Skin cleaned with betadine Anesthesia : lido with epi. 3 ml 1% Incision over fluctuant skin Large amount of pus drained Loculations broken up Flushed cavity Iodoform guaze packing No complications.   Labs Reviewed - No data to display No results found.   No diagnosis found.  Perineal abscess in diabetic. No toxicity. No signs forneir's gangrene  MDM  Perineal abscess      I personally performed the services described in this documentation, which was scribed in my presence. The recorded information has been reviewed and considered.    Barbara Cower, MD 02/27/12 1204

## 2012-02-27 NOTE — ED Notes (Signed)
Pt c/o left groin abscess; pt sts swollen and painful; pt denies drainage

## 2012-03-31 ENCOUNTER — Ambulatory Visit (INDEPENDENT_AMBULATORY_CARE_PROVIDER_SITE_OTHER): Payer: Self-pay | Admitting: Internal Medicine

## 2012-03-31 ENCOUNTER — Encounter: Payer: Self-pay | Admitting: Internal Medicine

## 2012-03-31 VITALS — BP 129/78 | HR 80 | Temp 97.3°F | Ht 77.0 in | Wt 321.9 lb

## 2012-03-31 DIAGNOSIS — I10 Essential (primary) hypertension: Secondary | ICD-10-CM

## 2012-03-31 DIAGNOSIS — E119 Type 2 diabetes mellitus without complications: Secondary | ICD-10-CM

## 2012-03-31 DIAGNOSIS — E785 Hyperlipidemia, unspecified: Secondary | ICD-10-CM | POA: Insufficient documentation

## 2012-03-31 MED ORDER — METFORMIN HCL 1000 MG PO TABS: 1000.0000 mg | ORAL_TABLET | Freq: Two times a day (BID) | ORAL | Status: DC

## 2012-03-31 MED ORDER — GLIPIZIDE 5 MG PO TABS: 5.0000 mg | ORAL_TABLET | ORAL | Status: DC

## 2012-03-31 MED ORDER — PRAVASTATIN SODIUM 20 MG PO TABS: 20.0000 mg | ORAL_TABLET | Freq: Every evening | ORAL | Status: DC

## 2012-03-31 MED ORDER — LISINOPRIL 5 MG PO TABS: 5.0000 mg | ORAL_TABLET | Freq: Every day | ORAL | Status: DC

## 2012-03-31 NOTE — Patient Instructions (Addendum)
-   I have added new medications for you - Prastatin and Glipizide. - Exercise for 1 hours at least 3 times a weeks  - Call the Health Department about Twinrix Vaccine for Hepatitis B vaccination. It might be freely available for you - Weight loss and diet compliance - Follow in 3 months

## 2012-03-31 NOTE — Assessment & Plan Note (Signed)
He reports compliance with the metformin. However he is HbA1c from May 2013 it is still above goal at 8.0%. He is on a maximum dose of metformin at 1000 mg twice a day. Therefore, we will add to glipizide at 5 mg once daily. Overall he seems to be responding to oral hypoglycemics. The patient has no insurance and it will therefore be difficult for him to have an ophthalmology evaluation.  But given his HbA1c which is not severely elevated and in a normal urine albumin/creatinine ratio in the past, this eye exam is not a very critical issue at this time. He has no visual problems and he fully understands why he needs this exam. The patient has been advised to call the health department to see if he can get hepatitis B vaccination (Twinrix). We will check his HbA1c on his next office visit as well as review the results of urine albumin-creatinine ratio.

## 2012-03-31 NOTE — Assessment & Plan Note (Addendum)
I have reviewed the results of his lipid profile done in May 2013 which shows a high cholesterol and LDL. Given his high BMI and DM, I believe he will benefit from a statin treatment  He will start pravastatin 20 mg daily. His medication is on the [facility] 4 dollar list. We will follow up his lipid profile in 1 year.

## 2012-03-31 NOTE — Assessment & Plan Note (Signed)
His blood pressure is well controlled with lisinopril. We'll continue to monitor his blood pressure from home and at home as he continues to take his lisinopril. There is no need to change his medication which is working nicely for his blood pressure.

## 2012-03-31 NOTE — Progress Notes (Deleted)
Patient ID: Jeremy [name], male   DOB: Aug 09, 1981, 31 y.o.   MRN: [id number]

## 2012-03-31 NOTE — Progress Notes (Deleted)
Patient ID: Jeremy [name], male   DOB: 30-Dec-1980, 31 y.o.   MRN: [id number]

## 2012-03-31 NOTE — Progress Notes (Signed)
Subjective:   Patient ID: Jeremy [name] male   DOB: [birth year] 31 y.o.   MRN: [id number]  HPI: [name] is a 31 y.o.with history of DM type 2, obesity, hyperlipidemia and hypertension. He comes to the clinic for a routine follow up. He has no complaints today. He was last seen in the clinic 3 months ago and he did not have any complaints then. He ran out of his meds since last evening. He comes to the clinic for refill of his DM and hypertension medicines metformin and lisinopril.  He comes in with his son and wife.  He reports to be compliant with his treatment. He is following a low-fat diet. However, he admits to a low level of physical activity.  He does not bring his blood blood sugar meter reports that his blood sugars are his ranging between 122 to 150.  His blood pressure has been nicely controlled with lisinopril.  Past Medical History  Diagnosis Date  . Abscess of left groin     s/p incision and drainage  . Diabetes mellitus     Uncontrolled A1C 5/12 13   Current Outpatient Prescriptions  Medication Sig Dispense Refill  . lisinopril (PRINIVIL,ZESTRIL) 5 MG tablet Take 1 tablet (5 mg total) by mouth daily.  30 tablet  2  . metFORMIN (GLUCOPHAGE) 1000 MG tablet Take 1 tablet (1,000 mg total) by mouth 2 (two) times daily with a meal.  60 tablet  2  . DISCONTD: lisinopril (PRINIVIL,ZESTRIL) 5 MG tablet Take 5 mg by mouth daily.      [name] DISCONTD: metFORMIN (GLUCOPHAGE) 1000 MG tablet Take 1 tablet (1,000 mg total) by mouth 2 (two) times daily with a meal.  60 tablet  3  . glipiZIDE (GLUCOTROL) 5 MG tablet Take 1 tablet (5 mg total) by mouth 1 day or 1 dose.  60 tablet  2  . ibuprofen (ADVIL,MOTRIN) 200 MG tablet Take 600 mg by mouth every 6 (six) hours as needed. As needed for pain.      . pravastatin (PRAVACHOL) 20 MG tablet Take 1 tablet (20 mg total) by mouth every evening.  30 tablet  2   No family history on file. History   Social History  . Marital Status:  Single    Spouse Name: N/A    Number of Children: N/A  . Years of Education: N/A   Social History Main Topics  . Smoking status: Former Smoker    Types: Cigarettes    Quit date: 02/19/2005  . Smokeless tobacco: None  . Alcohol Use: No  . Drug Use: No  . Sexually Active: Yes -- Male partner(s)   Other Topics Concern  . None   Social History Narrative  . None   Review of Systems: Negative except on and off eye irritation and excessive tearing in his eyes. No redness of eyes reported. His vision is intact. He has never had eyes examination by an ophthalmologist.   Objective:  Physical Exam: Filed Vitals:   03/31/12 1500  BP: 129/78  Pulse: 80  Temp: 97.3 F (36.3 C)  TempSrc: Oral  Height: 6\' 5"  (1.956 m)  Weight: 321 lb 14.4 oz (146.013 kg)  SpO2: 98%   General examination: Obese, not in distress. Fully alert X3 and oriented  HEENT: `normal exam. Normal EOM, and normal conjunctiva.  Chest: Clear to auscultation bilaterally.  Heart: Heart sounds cannot be fully appreciated due to body habitus. Abdomen: Normal fullness, normal bowel sounds. Extremities: Normal exam  Diabetic foot  Exam: Normal sensation, normal appearance and  Pulses are present. No signs of infection.   Assessment & Plan:

## 2012-04-01 LAB — MICROALBUMIN / CREATININE URINE RATIO
Creatinine, Urine: 225.3 mg/dL
Microalb Creat Ratio: 5.3 mg/g (ref 0.0–30.0)

## 2012-04-01 NOTE — Progress Notes (Signed)
agree.  good note.

## 2012-05-10 ENCOUNTER — Encounter (HOSPITAL_COMMUNITY): Payer: Self-pay | Admitting: *Deleted

## 2012-05-10 ENCOUNTER — Emergency Department (HOSPITAL_COMMUNITY)
Admission: EM | Admit: 2012-05-10 | Discharge: 2012-05-10 | Disposition: A | Discharge status: Home / Self Care | Payer: Self-pay | Attending: Emergency Medicine | Admitting: Emergency Medicine

## 2012-05-10 ENCOUNTER — Emergency Department (HOSPITAL_COMMUNITY): Payer: Self-pay

## 2012-05-10 DIAGNOSIS — E119 Type 2 diabetes mellitus without complications: Secondary | ICD-10-CM | POA: Insufficient documentation

## 2012-05-10 DIAGNOSIS — I10 Essential (primary) hypertension: Secondary | ICD-10-CM | POA: Insufficient documentation

## 2012-05-10 DIAGNOSIS — IMO0002 Reserved for concepts with insufficient information to code with codable children: Secondary | ICD-10-CM | POA: Insufficient documentation

## 2012-05-10 DIAGNOSIS — Z87891 Personal history of nicotine dependence: Secondary | ICD-10-CM | POA: Insufficient documentation

## 2012-05-10 DIAGNOSIS — W19XXXA Unspecified fall, initial encounter: Secondary | ICD-10-CM

## 2012-05-10 DIAGNOSIS — S01501A Unspecified open wound of lip, initial encounter: Secondary | ICD-10-CM | POA: Insufficient documentation

## 2012-05-10 MED ORDER — HYDROCODONE-ACETAMINOPHEN 5-325 MG PO TABS
2.0000 | ORAL_TABLET | Freq: Once | ORAL | Status: AC
  Administered 2012-05-10: 2 via ORAL
  Filled 2012-05-10: qty 2

## 2012-05-10 MED ORDER — PENICILLIN V POTASSIUM 500 MG PO TABS: 500.0000 mg | ORAL_TABLET | Freq: Three times a day (TID) | ORAL | Status: AC

## 2012-05-10 MED ORDER — PENICILLIN V POTASSIUM 250 MG PO TABS
500.0000 mg | ORAL_TABLET | Freq: Once | ORAL | Status: AC
  Administered 2012-05-10: 500 mg via ORAL
  Filled 2012-05-10: qty 2

## 2012-05-10 MED ORDER — HYDROCODONE-ACETAMINOPHEN 5-325 MG PO TABS: 2.0000 | ORAL_TABLET | ORAL | Status: AC | PRN

## 2012-05-10 MED ORDER — TETANUS-DIPHTH-ACELL PERTUSSIS 5-2.5-18.5 LF-MCG/0.5 IM SUSP
0.5000 mL | Freq: Once | INTRAMUSCULAR | Status: AC
  Administered 2012-05-10: 0.5 mL via INTRAMUSCULAR
  Filled 2012-05-10 (×3): qty 0.5
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
## 2012-06-09 ENCOUNTER — Encounter: Payer: Self-pay | Admitting: Internal Medicine

## 2012-07-09 ENCOUNTER — Ambulatory Visit: Payer: Self-pay | Admitting: Internal Medicine

## 2012-08-30 ENCOUNTER — Other Ambulatory Visit: Payer: Self-pay | Admitting: Internal Medicine

## 2012-08-30 DIAGNOSIS — E119 Type 2 diabetes mellitus without complications: Secondary | ICD-10-CM

## 2012-11-03 ENCOUNTER — Encounter: Payer: Self-pay | Admitting: Internal Medicine

## 2012-11-16 ENCOUNTER — Other Ambulatory Visit: Payer: Self-pay | Admitting: Internal Medicine

## 2012-12-13 ENCOUNTER — Ambulatory Visit: Payer: Self-pay | Admitting: Internal Medicine

## 2012-12-20 ENCOUNTER — Ambulatory Visit: Payer: Self-pay | Admitting: Internal Medicine

## 2012-12-20 ENCOUNTER — Encounter: Payer: Self-pay | Admitting: Internal Medicine

## 2013-01-13 ENCOUNTER — Encounter: Payer: Self-pay | Admitting: Internal Medicine

## 2013-01-13 ENCOUNTER — Ambulatory Visit: Payer: Self-pay | Admitting: Internal Medicine

## 2013-02-15 ENCOUNTER — Encounter: Payer: Self-pay | Admitting: Dietician

## 2013-03-17 ENCOUNTER — Other Ambulatory Visit: Payer: Self-pay

## 2013-05-18 ENCOUNTER — Telehealth: Payer: Self-pay | Admitting: *Deleted

## 2013-05-18 NOTE — Telephone Encounter (Signed)
Lets try to see if we can encourage him to come.  Thanks Clair Gulling.

## 2013-05-18 NOTE — Telephone Encounter (Signed)
In the 26 months documented this patient has cancelled >50% of his appointments and no showed another 40%. He needed to be seen in the ED twice. He has no financial information documented including  GCCN review for write off. DH, please contact this patient and see that he makes an appointment with you. Although he does not meet criteria for dismissal based on 3 consecutive no shows, I intend to reach out to him and discuss keeping appointments if OK with Dr. Alice Rieger? I would assume he would be due for f/u in the next couple months so DH, please let me know how your interaction goes.

## 2013-05-20 NOTE — Telephone Encounter (Signed)
Talked with Jeremy [name] and she will f/u on this appointment

## 2013-05-25 ENCOUNTER — Encounter: Payer: Self-pay | Admitting: Internal Medicine

## 2013-11-08 ENCOUNTER — Encounter (HOSPITAL_COMMUNITY): Admission: EM | Disposition: A | Discharge status: Home / Self Care | Payer: Self-pay | Source: Home / Self Care

## 2013-11-08 ENCOUNTER — Emergency Department (HOSPITAL_COMMUNITY): Payer: Self-pay

## 2013-11-08 ENCOUNTER — Encounter (HOSPITAL_COMMUNITY): Payer: MEDICAID | Admitting: Certified Registered Nurse Anesthetist

## 2013-11-08 ENCOUNTER — Encounter (HOSPITAL_COMMUNITY): Payer: Self-pay | Admitting: Emergency Medicine

## 2013-11-08 ENCOUNTER — Inpatient Hospital Stay (HOSPITAL_COMMUNITY)
Admission: EM | Admit: 2013-11-08 | Discharge: 2013-11-12 | DRG: 166 | Disposition: A | Discharge status: Home / Self Care | Payer: Self-pay | Attending: General Surgery | Admitting: General Surgery

## 2013-11-08 ENCOUNTER — Emergency Department (HOSPITAL_COMMUNITY): Payer: Self-pay | Admitting: Certified Registered Nurse Anesthetist

## 2013-11-08 ENCOUNTER — Other Ambulatory Visit: Payer: Self-pay

## 2013-11-08 ENCOUNTER — Inpatient Hospital Stay (HOSPITAL_COMMUNITY): Payer: MEDICAID

## 2013-11-08 ENCOUNTER — Inpatient Hospital Stay (HOSPITAL_COMMUNITY): Payer: Self-pay

## 2013-11-08 DIAGNOSIS — J942 Hemothorax: Secondary | ICD-10-CM

## 2013-11-08 DIAGNOSIS — E119 Type 2 diabetes mellitus without complications: Secondary | ICD-10-CM | POA: Diagnosis present

## 2013-11-08 DIAGNOSIS — R578 Other shock: Secondary | ICD-10-CM

## 2013-11-08 DIAGNOSIS — S25809A Unspecified injury of other blood vessels of thorax, unspecified side, initial encounter: Secondary | ICD-10-CM

## 2013-11-08 DIAGNOSIS — J45909 Unspecified asthma, uncomplicated: Secondary | ICD-10-CM | POA: Diagnosis present

## 2013-11-08 DIAGNOSIS — D62 Acute posthemorrhagic anemia: Secondary | ICD-10-CM | POA: Diagnosis not present

## 2013-11-08 DIAGNOSIS — R34 Anuria and oliguria: Secondary | ICD-10-CM | POA: Diagnosis present

## 2013-11-08 DIAGNOSIS — T794XXA Traumatic shock, initial encounter: Secondary | ICD-10-CM | POA: Diagnosis present

## 2013-11-08 DIAGNOSIS — I959 Hypotension, unspecified: Secondary | ICD-10-CM | POA: Diagnosis present

## 2013-11-08 DIAGNOSIS — I498 Other specified cardiac arrhythmias: Secondary | ICD-10-CM | POA: Diagnosis present

## 2013-11-08 DIAGNOSIS — S272XXA Traumatic hemopneumothorax, initial encounter: Secondary | ICD-10-CM | POA: Diagnosis present

## 2013-11-08 DIAGNOSIS — E876 Hypokalemia: Secondary | ICD-10-CM | POA: Diagnosis not present

## 2013-11-08 DIAGNOSIS — R571 Hypovolemic shock: Secondary | ICD-10-CM | POA: Diagnosis present

## 2013-11-08 DIAGNOSIS — F17200 Nicotine dependence, unspecified, uncomplicated: Secondary | ICD-10-CM | POA: Diagnosis present

## 2013-11-08 DIAGNOSIS — S2190XA Unspecified open wound of unspecified part of thorax, initial encounter: Secondary | ICD-10-CM | POA: Diagnosis present

## 2013-11-08 DIAGNOSIS — S271XXA Traumatic hemothorax, initial encounter: Principal | ICD-10-CM | POA: Diagnosis present

## 2013-11-08 DIAGNOSIS — S21109A Unspecified open wound of unspecified front wall of thorax without penetration into thoracic cavity, initial encounter: Secondary | ICD-10-CM

## 2013-11-08 DIAGNOSIS — S27339A Laceration of lung, unspecified, initial encounter: Secondary | ICD-10-CM

## 2013-11-08 DIAGNOSIS — I10 Essential (primary) hypertension: Secondary | ICD-10-CM | POA: Diagnosis present

## 2013-11-08 DIAGNOSIS — S1190XA Unspecified open wound of unspecified part of neck, initial encounter: Secondary | ICD-10-CM | POA: Diagnosis present

## 2013-11-08 DIAGNOSIS — S21119A Laceration without foreign body of unspecified front wall of thorax without penetration into thoracic cavity, initial encounter: Secondary | ICD-10-CM | POA: Diagnosis present

## 2013-11-08 HISTORY — PX: THORACOTOMY: SHX5074

## 2013-11-08 HISTORY — DX: Unspecified asthma, uncomplicated: J45.909

## 2013-11-08 LAB — COMPREHENSIVE METABOLIC PANEL
ALT: 19 U/L (ref 0–53)
AST: 18 U/L (ref 0–37)
Albumin: 2.9 g/dL — ABNORMAL LOW (ref 3.5–5.2)
Alkaline Phosphatase: 52 U/L (ref 39–117)
BUN: 12 mg/dL (ref 6–23)
CO2: 17 meq/L — AB (ref 19–32)
CREATININE: 1.23 mg/dL (ref 0.50–1.35)
Calcium: 8.1 mg/dL — ABNORMAL LOW (ref 8.4–10.5)
Chloride: 97 mEq/L (ref 96–112)
GFR calc Af Amer: 88 mL/min — ABNORMAL LOW (ref >90)
GFR, EST NON AFRICAN AMERICAN: 76 mL/min — AB (ref >90)
Glucose, Bld: 517 mg/dL — ABNORMAL HIGH (ref 70–99)
Potassium: 3.2 mEq/L — ABNORMAL LOW (ref 3.7–5.3)
Sodium: 140 mEq/L (ref 137–147)
Total Bilirubin: <0.2 mg/dL — ABNORMAL LOW (ref 0.3–1.2)
Total Protein: 6.4 g/dL (ref 6.0–8.3)

## 2013-11-08 LAB — CBC
HCT: 32.1 % — ABNORMAL LOW (ref 39.0–52.0)
HCT: 32.6 % — ABNORMAL LOW (ref 39.0–52.0)
HCT: 30.7 % — ABNORMAL LOW (ref 39.0–52.0)
HEMOGLOBIN: 10.6 g/dL — AB (ref 13.0–17.0)
Hemoglobin: 11.4 g/dL — ABNORMAL LOW (ref 13.0–17.0)
Hemoglobin: 10.6 g/dL — ABNORMAL LOW (ref 13.0–17.0)
MCH: 27.2 pg (ref 26.0–34.0)
MCH: 28.3 pg (ref 26.0–34.0)
MCH: 27.5 pg (ref 26.0–34.0)
MCHC: 33 g/dL (ref 30.0–36.0)
MCHC: 35 g/dL (ref 30.0–36.0)
MCHC: 34.5 g/dL (ref 30.0–36.0)
MCV: 82.3 fL (ref 78.0–100.0)
MCV: 80.9 fL (ref 78.0–100.0)
MCV: 79.7 fL (ref 78.0–100.0)
Platelets: 271 10*3/uL (ref 150–400)
Platelets: 167 10*3/uL (ref 150–400)
Platelets: 166 10*3/uL (ref 150–400)
RBC: 3.9 MIL/uL — AB (ref 4.22–5.81)
RBC: 4.03 MIL/uL — ABNORMAL LOW (ref 4.22–5.81)
RBC: 3.85 MIL/uL — ABNORMAL LOW (ref 4.22–5.81)
RDW: 13.5 % (ref 11.5–15.5)
RDW: 13.5 % (ref 11.5–15.5)
RDW: 13.6 % (ref 11.5–15.5)
WBC: 6.8 10*3/uL (ref 4.0–10.5)
WBC: 16.5 10*3/uL — ABNORMAL HIGH (ref 4.0–10.5)
WBC: 13.9 10*3/uL — ABNORMAL HIGH (ref 4.0–10.5)

## 2013-11-08 LAB — POCT I-STAT 3, ART BLOOD GAS (G3+)
Acid-Base Excess: 1 mmol/L (ref 0.0–2.0)
Acid-base deficit: 3 mmol/L — ABNORMAL HIGH (ref 0.0–2.0)
Bicarbonate: 23.1 mEq/L (ref 20.0–24.0)
Bicarbonate: 26.4 mEq/L — ABNORMAL HIGH (ref 20.0–24.0)
O2 Saturation: 96 %
O2 Saturation: 99 %
Patient temperature: 97.4
Patient temperature: 97.9
TCO2: 24 mmol/L (ref 0–100)
TCO2: 28 mmol/L (ref 0–100)
pCO2 arterial: 40.8 mmHg (ref 35.0–45.0)
pCO2 arterial: 42.2 mmHg (ref 35.0–45.0)
pH, Arterial: 7.357 (ref 7.350–7.450)
pH, Arterial: 7.402 (ref 7.350–7.450)
pO2, Arterial: 80 mmHg (ref 80.0–100.0)
pO2, Arterial: 158 mmHg — ABNORMAL HIGH (ref 80.0–100.0)

## 2013-11-08 LAB — BASIC METABOLIC PANEL
BUN: 12 mg/dL (ref 6–23)
BUN: 9 mg/dL (ref 6–23)
CO2: 23 mEq/L (ref 19–32)
CO2: 25 mEq/L (ref 19–32)
Calcium: 7.3 mg/dL — ABNORMAL LOW (ref 8.4–10.5)
Calcium: 7.7 mg/dL — ABNORMAL LOW (ref 8.4–10.5)
Chloride: 105 mEq/L (ref 96–112)
Chloride: 107 mEq/L (ref 96–112)
Creatinine, Ser: 0.85 mg/dL (ref 0.50–1.35)
Creatinine, Ser: 0.73 mg/dL (ref 0.50–1.35)
GFR calc Af Amer: >90 mL/min (ref >90)
GFR calc Af Amer: >90 mL/min (ref >90)
GFR calc non Af Amer: >90 mL/min (ref >90)
GFR calc non Af Amer: >90 mL/min (ref >90)
Glucose, Bld: 406 mg/dL — ABNORMAL HIGH (ref 70–99)
Glucose, Bld: 273 mg/dL — ABNORMAL HIGH (ref 70–99)
Potassium: 5 mEq/L (ref 3.7–5.3)
Potassium: 3.7 mEq/L (ref 3.7–5.3)
Sodium: 142 mEq/L (ref 137–147)
Sodium: 144 mEq/L (ref 137–147)

## 2013-11-08 LAB — GLUCOSE, CAPILLARY
Glucose-Capillary: 288 mg/dL — ABNORMAL HIGH (ref 70–99)
Glucose-Capillary: 220 mg/dL — ABNORMAL HIGH (ref 70–99)
Glucose-Capillary: 191 mg/dL — ABNORMAL HIGH (ref 70–99)
Glucose-Capillary: 168 mg/dL — ABNORMAL HIGH (ref 70–99)
Glucose-Capillary: 185 mg/dL — ABNORMAL HIGH (ref 70–99)

## 2013-11-08 LAB — PREPARE FRESH FROZEN PLASMA
UNIT DIVISION: 0
Unit division: 0

## 2013-11-08 LAB — PROTIME-INR
INR: 1.05 (ref 0.00–1.49)
INR: 1.01 (ref 0.00–1.49)
PROTHROMBIN TIME: 13.5 s (ref 11.6–15.2)
PROTHROMBIN TIME: 13.1 s (ref 11.6–15.2)

## 2013-11-08 LAB — CDS SEROLOGY

## 2013-11-08 LAB — BLOOD PRODUCT ORDER (VERBAL) VERIFICATION

## 2013-11-08 LAB — PREPARE RBC (CROSSMATCH)

## 2013-11-08 LAB — I-STAT CHEM 8, ED
BUN: 11 mg/dL (ref 6–23)
CALCIUM ION: 1.07 mmol/L — AB (ref 1.12–1.23)
Chloride: 99 mEq/L (ref 96–112)
Creatinine, Ser: 1.4 mg/dL — ABNORMAL HIGH (ref 0.50–1.35)
Glucose, Bld: 491 mg/dL — ABNORMAL HIGH (ref 70–99)
HEMATOCRIT: 37 % — AB (ref 39.0–52.0)
HEMOGLOBIN: 12.6 g/dL — AB (ref 13.0–17.0)
Potassium: 2.8 mEq/L — CL (ref 3.7–5.3)
Sodium: 139 mEq/L (ref 137–147)
TCO2: 18 mmol/L (ref 0–100)

## 2013-11-08 LAB — TRIGLYCERIDES: Triglycerides: 159 mg/dL — ABNORMAL HIGH (ref <150)

## 2013-11-08 LAB — ABO/RH: ABO/RH(D): A POS

## 2013-11-08 LAB — MRSA PCR SCREENING: MRSA by PCR: NEGATIVE

## 2013-11-08 SURGERY — THORACOTOMY, MAJOR
Anesthesia: General | Site: Chest | Laterality: Left

## 2013-11-08 MED ORDER — POTASSIUM CHLORIDE IN NACL 40-0.9 MEQ/L-% IV SOLN
INTRAVENOUS | Status: DC
  Filled 2013-11-08 (×2): qty 1000

## 2013-11-08 MED ORDER — PHENYLEPHRINE HCL 10 MG/ML IJ SOLN
10.0000 mg | INTRAVENOUS | Status: DC | PRN
  Administered 2013-11-08: 10 ug/min via INTRAVENOUS

## 2013-11-08 MED ORDER — ONDANSETRON HCL 4 MG/2ML IJ SOLN
4.0000 mg | Freq: Four times a day (QID) | INTRAMUSCULAR | Status: DC | PRN
Start: 2013-11-08 — End: 2013-11-12
  Administered 2013-11-10 – 2013-11-11 (×2): 4 mg via INTRAVENOUS
  Filled 2013-11-08: qty 2

## 2013-11-08 MED ORDER — PROPOFOL INFUSION 10 MG/ML OPTIME
INTRAVENOUS | Status: DC | PRN
  Administered 2013-11-08: 50 ug/kg/min via INTRAVENOUS

## 2013-11-08 MED ORDER — PROPOFOL 10 MG/ML IV BOLUS
INTRAVENOUS | Status: AC
  Filled 2013-11-08: qty 20

## 2013-11-08 MED ORDER — LACTATED RINGERS IV SOLN
INTRAVENOUS | Status: DC | PRN
  Administered 2013-11-08 (×3): via INTRAVENOUS

## 2013-11-08 MED ORDER — ARTIFICIAL TEARS OP OINT
TOPICAL_OINTMENT | OPHTHALMIC | Status: DC | PRN
  Administered 2013-11-08: 1 via OPHTHALMIC

## 2013-11-08 MED ORDER — SENNOSIDES-DOCUSATE SODIUM 8.6-50 MG PO TABS
1.0000 | ORAL_TABLET | Freq: Every evening | ORAL | Status: DC | PRN
  Filled 2013-11-08: qty 1

## 2013-11-08 MED ORDER — POTASSIUM CHLORIDE 10 MEQ/50ML IV SOLN
10.0000 meq | Freq: Every day | INTRAVENOUS | Status: DC | PRN
  Administered 2013-11-11 (×3): 10 meq via INTRAVENOUS
  Filled 2013-11-08 (×2): qty 50

## 2013-11-08 MED ORDER — SODIUM CHLORIDE 0.9 % IV SOLN
1000.0000 mL | Freq: Once | INTRAVENOUS | Status: AC
  Administered 2013-11-08: 1000 mL via INTRAVENOUS

## 2013-11-08 MED ORDER — ACETAMINOPHEN 160 MG/5ML PO SOLN
1000.0000 mg | Freq: Four times a day (QID) | ORAL | Status: AC
  Administered 2013-11-08: 1000 mg via ORAL
  Filled 2013-11-08: qty 40.6

## 2013-11-08 MED ORDER — LEVALBUTEROL HCL 0.63 MG/3ML IN NEBU
0.6300 mg | INHALATION_SOLUTION | Freq: Four times a day (QID) | RESPIRATORY_TRACT | Status: DC
Start: 2013-11-08 — End: 2013-11-09
  Administered 2013-11-08 – 2013-11-09 (×4): 0.63 mg via RESPIRATORY_TRACT
  Filled 2013-11-08 (×8): qty 3

## 2013-11-08 MED ORDER — DEXTROSE 50 % IV SOLN: 25.0000 mL | INTRAVENOUS | Status: DC | PRN

## 2013-11-08 MED ORDER — ALBUMIN HUMAN 5 % IV SOLN
12.5000 g | Freq: Once | INTRAVENOUS | Status: AC
  Administered 2013-11-08: 12.5 g via INTRAVENOUS
  Filled 2013-11-08: qty 250

## 2013-11-08 MED ORDER — HEMOSTATIC AGENTS (NO CHARGE) OPTIME
TOPICAL | Status: DC | PRN
  Administered 2013-11-08: 1 via TOPICAL

## 2013-11-08 MED ORDER — CEFUROXIME SODIUM 1.5 G IJ SOLR
1.5000 g | Freq: Two times a day (BID) | INTRAMUSCULAR | Status: DC
  Administered 2013-11-08 – 2013-11-09 (×3): 1.5 g via INTRAVENOUS
  Filled 2013-11-08 (×5): qty 1.5

## 2013-11-08 MED ORDER — DIPHENHYDRAMINE HCL 12.5 MG/5ML PO ELIX
12.5000 mg | ORAL_SOLUTION | Freq: Four times a day (QID) | ORAL | Status: DC | PRN
  Filled 2013-11-08: qty 5

## 2013-11-08 MED ORDER — OXYCODONE-ACETAMINOPHEN 5-325 MG PO TABS: 1.0000 | ORAL_TABLET | ORAL | Status: DC | PRN

## 2013-11-08 MED ORDER — NEOSTIGMINE METHYLSULFATE 1 MG/ML IJ SOLN
INTRAMUSCULAR | Status: AC
  Filled 2013-11-08: qty 10

## 2013-11-08 MED ORDER — INSULIN REGULAR BOLUS VIA INFUSION
0.0000 [IU] | Freq: Three times a day (TID) | INTRAVENOUS | Status: DC
  Filled 2013-11-08: qty 10

## 2013-11-08 MED ORDER — LIDOCAINE HCL (CARDIAC) 20 MG/ML IV SOLN
INTRAVENOUS | Status: AC
  Filled 2013-11-08: qty 5

## 2013-11-08 MED ORDER — ARTIFICIAL TEARS OP OINT
TOPICAL_OINTMENT | OPHTHALMIC | Status: AC
  Filled 2013-11-08: qty 3.5

## 2013-11-08 MED ORDER — MIDAZOLAM HCL 2 MG/2ML IJ SOLN
INTRAMUSCULAR | Status: AC
  Filled 2013-11-08: qty 2

## 2013-11-08 MED ORDER — GLYCOPYRROLATE 0.2 MG/ML IJ SOLN
INTRAMUSCULAR | Status: AC
  Filled 2013-11-08: qty 3

## 2013-11-08 MED ORDER — SODIUM CHLORIDE 0.9 % IV SOLN
INTRAVENOUS | Status: DC | PRN
Start: 2013-11-08 — End: 2013-11-08
  Administered 2013-11-08 (×2): via INTRAVENOUS

## 2013-11-08 MED ORDER — INSULIN ASPART 100 UNIT/ML ~~LOC~~ SOLN: 0.0000 [IU] | Freq: Three times a day (TID) | SUBCUTANEOUS | Status: DC

## 2013-11-08 MED ORDER — FENTANYL CITRATE 0.05 MG/ML IJ SOLN
INTRAMUSCULAR | Status: DC | PRN
  Administered 2013-11-08 (×4): 50 ug via INTRAVENOUS
  Administered 2013-11-08: 100 ug via INTRAVENOUS
  Administered 2013-11-08: 50 ug via INTRAVENOUS
  Administered 2013-11-08 (×2): 150 ug via INTRAVENOUS
  Administered 2013-11-08 (×2): 50 ug via INTRAVENOUS

## 2013-11-08 MED ORDER — MIDAZOLAM HCL 5 MG/5ML IJ SOLN
INTRAMUSCULAR | Status: DC | PRN
  Administered 2013-11-08: 2 mg via INTRAVENOUS

## 2013-11-08 MED ORDER — PANTOPRAZOLE SODIUM 40 MG PO TBEC: 40.0000 mg | DELAYED_RELEASE_TABLET | Freq: Every day | ORAL | Status: DC

## 2013-11-08 MED ORDER — VECURONIUM BROMIDE 10 MG IV SOLR
INTRAVENOUS | Status: DC | PRN
  Administered 2013-11-08: 4 mg via INTRAVENOUS
  Administered 2013-11-08 (×2): 3 mg via INTRAVENOUS
  Administered 2013-11-08: 5 mg via INTRAVENOUS

## 2013-11-08 MED ORDER — SODIUM CHLORIDE 0.9 % IV SOLN: 1000.0000 mL | INTRAVENOUS | Status: DC

## 2013-11-08 MED ORDER — IOHEXOL 350 MG/ML SOLN
100.0000 mL | Freq: Once | INTRAVENOUS | Status: AC | PRN
  Administered 2013-11-08: 100 mL via INTRAVENOUS

## 2013-11-08 MED ORDER — ONDANSETRON HCL 4 MG PO TABS: 4.0000 mg | ORAL_TABLET | Freq: Four times a day (QID) | ORAL | Status: DC | PRN

## 2013-11-08 MED ORDER — SODIUM CHLORIDE 0.9 % IV SOLN: 1000.0000 mL | Freq: Once | INTRAVENOUS | Status: DC

## 2013-11-08 MED ORDER — FENTANYL CITRATE 0.05 MG/ML IJ SOLN
INTRAMUSCULAR | Status: AC
  Filled 2013-11-08: qty 5

## 2013-11-08 MED ORDER — DIPHENHYDRAMINE HCL 50 MG/ML IJ SOLN
12.5000 mg | Freq: Four times a day (QID) | INTRAMUSCULAR | Status: DC | PRN
Start: 2013-11-08 — End: 2013-11-08

## 2013-11-08 MED ORDER — PANTOPRAZOLE SODIUM 40 MG IV SOLR
40.0000 mg | Freq: Every day | INTRAVENOUS | Status: DC
  Administered 2013-11-08 – 2013-11-10 (×3): 40 mg via INTRAVENOUS
  Filled 2013-11-08 (×3): qty 40

## 2013-11-08 MED ORDER — INSULIN ASPART 100 UNIT/ML ~~LOC~~ SOLN
10.0000 [IU] | SUBCUTANEOUS | Status: DC
  Filled 2013-11-08: qty 0.1

## 2013-11-08 MED ORDER — 0.9 % SODIUM CHLORIDE (POUR BTL) OPTIME
TOPICAL | Status: DC | PRN
  Administered 2013-11-08: 1000 mL

## 2013-11-08 MED ORDER — PROPOFOL 10 MG/ML IV BOLUS
INTRAVENOUS | Status: DC | PRN
  Administered 2013-11-08: 200 mg via INTRAVENOUS

## 2013-11-08 MED ORDER — ACETAMINOPHEN 500 MG PO TABS: 1000.0000 mg | ORAL_TABLET | Freq: Four times a day (QID) | ORAL | Status: AC

## 2013-11-08 MED ORDER — SODIUM CHLORIDE 0.9 % IJ SOLN: 9.0000 mL | INTRAMUSCULAR | Status: DC | PRN

## 2013-11-08 MED ORDER — FENTANYL CITRATE 0.05 MG/ML IJ SOLN
INTRAMUSCULAR | Status: DC | PRN
  Administered 2013-11-08: 50 ug via INTRAVENOUS

## 2013-11-08 MED ORDER — SODIUM CHLORIDE 0.9 % IV SOLN
INTRAVENOUS | Status: DC
  Administered 2013-11-08: 16:00:00 via INTRAVENOUS

## 2013-11-08 MED ORDER — HYDRALAZINE HCL 20 MG/ML IJ SOLN
10.0000 mg | INTRAMUSCULAR | Status: DC | PRN
  Administered 2013-11-11: 10 mg via INTRAVENOUS
  Filled 2013-11-08: qty 1

## 2013-11-08 MED ORDER — OXYCODONE HCL 5 MG PO TABS: 5.0000 mg | ORAL_TABLET | ORAL | Status: DC | PRN

## 2013-11-08 MED ORDER — BISACODYL 5 MG PO TBEC
10.0000 mg | DELAYED_RELEASE_TABLET | Freq: Every day | ORAL | Status: DC
  Administered 2013-11-09 – 2013-11-12 (×4): 10 mg via ORAL
  Filled 2013-11-08 (×4): qty 2

## 2013-11-08 MED ORDER — DEXTROSE-NACL 5-0.45 % IV SOLN
INTRAVENOUS | Status: DC
  Administered 2013-11-09: 50 mL/h via INTRAVENOUS

## 2013-11-08 MED ORDER — MIDAZOLAM HCL 2 MG/2ML IJ SOLN
2.0000 mg | INTRAMUSCULAR | Status: DC | PRN
  Administered 2013-11-08 – 2013-11-09 (×6): 2 mg via INTRAVENOUS
  Filled 2013-11-08 (×6): qty 2

## 2013-11-08 MED ORDER — ONDANSETRON HCL 4 MG/2ML IJ SOLN
4.0000 mg | Freq: Four times a day (QID) | INTRAMUSCULAR | Status: DC | PRN
  Administered 2013-11-09 (×2): 4 mg via INTRAVENOUS
  Filled 2013-11-08 (×2): qty 2

## 2013-11-08 MED ORDER — INSULIN ASPART 100 UNIT/ML ~~LOC~~ SOLN
0.0000 [IU] | SUBCUTANEOUS | Status: DC
  Administered 2013-11-08: 11 [IU] via SUBCUTANEOUS

## 2013-11-08 MED ORDER — CEFAZOLIN SODIUM-DEXTROSE 2-3 GM-% IV SOLR
2.0000 g | Freq: Once | INTRAVENOUS | Status: AC
  Administered 2013-11-08: 3 g via INTRAVENOUS

## 2013-11-08 MED ORDER — SUCCINYLCHOLINE CHLORIDE 20 MG/ML IJ SOLN
INTRAMUSCULAR | Status: DC | PRN
  Administered 2013-11-08: 140 mg via INTRAVENOUS

## 2013-11-08 MED ORDER — INSULIN REGULAR HUMAN 100 UNIT/ML IJ SOLN
INTRAMUSCULAR | Status: DC
  Administered 2013-11-08: 1.6 [IU]/h via INTRAVENOUS
  Filled 2013-11-08 (×2): qty 1

## 2013-11-08 MED ORDER — DOCUSATE SODIUM 100 MG PO CAPS
100.0000 mg | ORAL_CAPSULE | Freq: Two times a day (BID) | ORAL | Status: DC
Start: 2013-11-08 — End: 2013-11-08

## 2013-11-08 MED ORDER — SODIUM BICARBONATE 8.4 % IV SOLN
INTRAVENOUS | Status: DC | PRN
  Administered 2013-11-08: 50 meq via INTRAVENOUS

## 2013-11-08 MED ORDER — FENTANYL CITRATE 0.05 MG/ML IJ SOLN
100.0000 ug | INTRAMUSCULAR | Status: DC | PRN
  Administered 2013-11-08 (×2): 100 ug via INTRAVENOUS
  Filled 2013-11-08 (×2): qty 2

## 2013-11-08 MED ORDER — NALOXONE HCL 0.4 MG/ML IJ SOLN: 0.4000 mg | INTRAMUSCULAR | Status: DC | PRN

## 2013-11-08 MED ORDER — SODIUM CHLORIDE 0.9 % IV SOLN
INTRAVENOUS | Status: DC
  Administered 2013-11-09 (×2): via INTRAVENOUS
  Administered 2013-11-10: 50 mL/h via INTRAVENOUS

## 2013-11-08 MED ORDER — ROCURONIUM BROMIDE 100 MG/10ML IV SOLN
INTRAVENOUS | Status: DC | PRN
  Administered 2013-11-08: 50 mg via INTRAVENOUS

## 2013-11-08 MED ORDER — INSULIN ASPART 100 UNIT/ML ~~LOC~~ SOLN
SUBCUTANEOUS | Status: DC | PRN
  Administered 2013-11-08: 10 [IU] via SUBCUTANEOUS

## 2013-11-08 MED ORDER — LIDOCAINE HCL (CARDIAC) 20 MG/ML IV SOLN
INTRAVENOUS | Status: DC | PRN
  Administered 2013-11-08: 100 mg via INTRAVENOUS

## 2013-11-08 MED ORDER — FENTANYL CITRATE 0.05 MG/ML IJ SOLN
50.0000 ug | Freq: Once | INTRAMUSCULAR | Status: AC
  Administered 2013-11-08: 50 ug via INTRAVENOUS

## 2013-11-08 MED ORDER — FENTANYL CITRATE 0.05 MG/ML IJ SOLN
100.0000 ug | INTRAMUSCULAR | Status: DC | PRN
  Administered 2013-11-08 – 2013-11-10 (×10): 100 ug via INTRAVENOUS
  Filled 2013-11-08 (×10): qty 2

## 2013-11-08 MED ORDER — ALBUMIN HUMAN 5 % IV SOLN
INTRAVENOUS | Status: DC | PRN
  Administered 2013-11-08: 11:00:00 via INTRAVENOUS

## 2013-11-08 MED ORDER — VECURONIUM BROMIDE 10 MG IV SOLR
INTRAVENOUS | Status: AC
  Filled 2013-11-08: qty 20

## 2013-11-08 MED ORDER — ONDANSETRON HCL 4 MG/2ML IJ SOLN
INTRAMUSCULAR | Status: AC
  Filled 2013-11-08: qty 2

## 2013-11-08 MED ORDER — PROPOFOL 10 MG/ML IV EMUL
0.0000 ug/kg/min | INTRAVENOUS | Status: DC
  Administered 2013-11-08 – 2013-11-09 (×7): 50 ug/kg/min via INTRAVENOUS
  Filled 2013-11-08 (×8): qty 100

## 2013-11-08 MED ORDER — MIDAZOLAM HCL 2 MG/2ML IJ SOLN
2.0000 mg | INTRAMUSCULAR | Status: DC | PRN
  Administered 2013-11-08 (×2): 2 mg via INTRAVENOUS
  Filled 2013-11-08 (×2): qty 2

## 2013-11-08 MED ORDER — TETANUS-DIPHTH-ACELL PERTUSSIS 5-2.5-18.5 LF-MCG/0.5 IM SUSP: 0.5000 mL | Freq: Once | INTRAMUSCULAR | Status: DC

## 2013-11-08 MED ORDER — MORPHINE SULFATE (PF) 1 MG/ML IV SOLN: INTRAVENOUS | Status: DC

## 2013-11-08 MED ORDER — ROCURONIUM BROMIDE 50 MG/5ML IV SOLN
INTRAVENOUS | Status: AC
  Filled 2013-11-08: qty 1

## 2013-11-08 MED ORDER — CEFAZOLIN SODIUM-DEXTROSE 2-3 GM-% IV SOLR
INTRAVENOUS | Status: AC
  Filled 2013-11-08: qty 100

## 2013-11-08 SURGICAL SUPPLY — 77 items
ADH SKN CLS APL DERMABOND .7 (GAUZE/BANDAGES/DRESSINGS)
BAG DECANTER FOR FLEXI CONT (MISCELLANEOUS) IMPLANT
BLADE SURG 11 STRL SS (BLADE) ×3 IMPLANT
CANISTER SUCTION 2500CC (MISCELLANEOUS) ×6 IMPLANT
CATH KIT ON Q 5IN SLV (PAIN MANAGEMENT) IMPLANT
CATH ROBINSON RED A/P 22FR (CATHETERS) IMPLANT
CATH THORACIC 28FR (CATHETERS) IMPLANT
CATH THORACIC 36FR (CATHETERS) IMPLANT
CATH THORACIC 36FR RT ANG (CATHETERS) IMPLANT
CHERRY SPONGEY 1/2 (GAUZE/BANDAGES/DRESSINGS) ×1 IMPLANT
CLIP TI WIDE RED SMALL 24 (CLIP) ×2 IMPLANT
CLIP TI WIDE RED SMALL 6 (CLIP) ×4 IMPLANT
CONN Y 3/8X3/8X3/8  BEN (MISCELLANEOUS) ×2
CONN Y 3/8X3/8X3/8 BEN (MISCELLANEOUS) ×1 IMPLANT
CONT SPEC 4OZ CLIKSEAL STRL BL (MISCELLANEOUS) ×4 IMPLANT
COVER SURGICAL LIGHT HANDLE (MISCELLANEOUS) ×6 IMPLANT
DERMABOND ADVANCED (GAUZE/BANDAGES/DRESSINGS)
DERMABOND ADVANCED .7 DNX12 (GAUZE/BANDAGES/DRESSINGS) IMPLANT
DRAPE CHEST BREAST 15X10 FENES (DRAPES) ×2 IMPLANT
DRAPE LAPAROSCOPIC ABDOMINAL (DRAPES) ×3 IMPLANT
DRAPE WARM FLUID 44X44 (DRAPE) ×4 IMPLANT
DRSG AQUACEL AG ADV 3.5X14 (GAUZE/BANDAGES/DRESSINGS) ×1 IMPLANT
DRSG COVADERM 4X6 (GAUZE/BANDAGES/DRESSINGS) ×4 IMPLANT
ELECT REM PT RETURN 9FT ADLT (ELECTROSURGICAL) ×3
ELECTRODE REM PT RTRN 9FT ADLT (ELECTROSURGICAL) ×1 IMPLANT
GEL ULTRASOUND 20GR AQUASONIC (MISCELLANEOUS) IMPLANT
GLOVE ORTHO TXT STRL SZ7.5 (GLOVE) ×6 IMPLANT
GOWN STRL REUS W/ TWL LRG LVL3 (GOWN DISPOSABLE) ×2 IMPLANT
GOWN STRL REUS W/TWL LRG LVL3 (GOWN DISPOSABLE)
KIT BASIN OR (CUSTOM PROCEDURE TRAY) ×3 IMPLANT
KIT ROOM TURNOVER OR (KITS) ×3 IMPLANT
KIT SUCTION CATH 14FR (SUCTIONS) ×3 IMPLANT
NS IRRIG 1000ML POUR BTL (IV SOLUTION) ×6 IMPLANT
PACK CHEST (CUSTOM PROCEDURE TRAY) ×3 IMPLANT
PAD ARMBOARD 7.5X6 YLW CONV (MISCELLANEOUS) ×6 IMPLANT
SEALANT SURG COSEAL 4ML (VASCULAR PRODUCTS) ×2 IMPLANT
SOLUTION ANTI FOG 6CC (MISCELLANEOUS) ×1 IMPLANT
SPECIMEN JAR MEDIUM (MISCELLANEOUS) ×1 IMPLANT
SPONGE GAUZE 4X4 12PLY (GAUZE/BANDAGES/DRESSINGS) ×3 IMPLANT
SPONGE TONSIL 1.25 RF SGL STRG (GAUZE/BANDAGES/DRESSINGS) ×3 IMPLANT
STAPLER VISISTAT 35W (STAPLE) IMPLANT
SUCTION POOLE TIP (SUCTIONS) IMPLANT
SUT CHROMIC 3 0 SH 27 (SUTURE) ×10 IMPLANT
SUT ETHIBOND NAB MH 2-0 36IN (SUTURE) ×4 IMPLANT
SUT ETHILON 3 0 PS 1 (SUTURE) IMPLANT
SUT PROLENE 2 0 MH 48 (SUTURE) ×2 IMPLANT
SUT PROLENE 3 0 SH DA (SUTURE) IMPLANT
SUT PROLENE 4 0 RB 1 (SUTURE)
SUT PROLENE 4 0 SH DA (SUTURE) ×8 IMPLANT
SUT PROLENE 4-0 RB1 .5 CRCL 36 (SUTURE) IMPLANT
SUT SILK  1 MH (SUTURE) ×6
SUT SILK 1 MH (SUTURE) ×2 IMPLANT
SUT SILK 2 0 SH CR/8 (SUTURE) ×4 IMPLANT
SUT SILK 2 0SH CR/8 30 (SUTURE) IMPLANT
SUT SILK 3 0SH CR/8 30 (SUTURE) IMPLANT
SUT VIC AB 1 CTX 18 (SUTURE) ×4 IMPLANT
SUT VIC AB 2 TP1 27 (SUTURE) IMPLANT
SUT VIC AB 2-0 CT1 27 (SUTURE)
SUT VIC AB 2-0 CT1 TAPERPNT 27 (SUTURE) IMPLANT
SUT VIC AB 2-0 CTX 36 (SUTURE) IMPLANT
SUT VIC AB 3-0 MH 27 (SUTURE) IMPLANT
SUT VIC AB 3-0 SH 18 (SUTURE) ×2 IMPLANT
SUT VIC AB 3-0 SH 27 (SUTURE)
SUT VIC AB 3-0 SH 27X BRD (SUTURE) IMPLANT
SUT VIC AB 3-0 X1 27 (SUTURE) IMPLANT
SUT VICRYL 0 UR6 27IN ABS (SUTURE) ×1 IMPLANT
SUT VICRYL 2 TP 1 (SUTURE) IMPLANT
SUT VICRYL 4-0 PS2 18IN ABS (SUTURE) IMPLANT
SWAB COLLECTION DEVICE MRSA (MISCELLANEOUS) IMPLANT
SYSTEM SAHARA CHEST DRAIN ATS (WOUND CARE) ×3 IMPLANT
TAPE CLOTH SURG 4X10 WHT LF (GAUZE/BANDAGES/DRESSINGS) ×2 IMPLANT
TOWEL OR 17X24 6PK STRL BLUE (TOWEL DISPOSABLE) ×3 IMPLANT
TOWEL OR 17X26 10 PK STRL BLUE (TOWEL DISPOSABLE) ×5 IMPLANT
TRAP SPECIMEN MUCOUS 40CC (MISCELLANEOUS) IMPLANT
TRAY FOLEY CATH 16FRSI W/METER (SET/KITS/TRAYS/PACK) ×3 IMPLANT
TUBE ANAEROBIC SPECIMEN COL (MISCELLANEOUS) IMPLANT
WATER STERILE IRR 1000ML POUR (IV SOLUTION) ×6 IMPLANT

## 2013-11-08 NOTE — Progress Notes (Signed)
UR completed.  Tonio Seider, RN BSN [facility] CCM Trauma/Neuro ICU Case Manager [phone]  

## 2013-11-08 NOTE — Progress Notes (Addendum)
TCTS PM rounds   Stable on vent but agitated when he wakesup Min chest tube output Pm labs ok Plan vent wean tomorrow- may need PCA after extubation Start Iv insulin glucose stabilizer for CBG > 400

## 2013-11-08 NOTE — Anesthesia Postprocedure Evaluation (Signed)
  Anesthesia Post-op Note  Patient: Jeremy [name]  Procedure(s) Performed: Procedure(s): LEFT THORACOTOMY (Left)  Patient Location: SICU  Anesthesia Type:General  Level of Consciousness: awake  Airway and Oxygen Therapy: Patient Spontanous Breathing  Post-op Pain: mild  Post-op Assessment: Post-op Vital signs reviewed  Post-op Vital Signs: Reviewed  Complications: No apparent anesthesia complications
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
## 2013-11-08 NOTE — Progress Notes (Signed)
Dr. Grandville Silos updated on pt status and lab results. Made aware pt's K is 5.0-will remove KCL from IV fluids. Updated on PT/INR and hgb/hct. Made aware glucose on BMET was 406-will increase CBG to q4h on resistant scale. Also made aware arterial line has large whip-pressures reading 170s-180s/80s, cuff pressures 120-130s/70s. Okay to go by cuff pressure per MD. Also clarified with MD that pt had Tdap vaccine last year and does not need to be administered. Pt otherwise stable, sedated on ventilator. Will continue to monitor closely. Fulton Reek, RN

## 2013-11-08 NOTE — Preoperative (Signed)
Beta Blockers   Reason not to administer Beta Blockers:Not Applicable 

## 2013-11-08 NOTE — ED Notes (Signed)
Pt taken to CT with this RN.

## 2013-11-08 NOTE — Progress Notes (Signed)
ED called unit back to report that the [facility] has pt's belongings and in order to inquire about them, the family needs to call the non-emergent line. Pt's girlfriend made aware.  Fulton Reek, RN

## 2013-11-08 NOTE — Progress Notes (Signed)
Patient ID: Sreekar Manner, male   DOB: 17-Oct-1980, 33 y.o.   MRN: [id number] Looks good post op. Base deficit 3 so will give albumin bolus. Check PT INR as may need FFP. I also D/W Dr. Lawson Fiscal at the bedside. Plan wean in AM. Georganna Skeans, MD, MPH, FACS Trauma: [phone] General Surgery: [phone]
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
## 2013-11-08 NOTE — H&P (Signed)
Critical care 40 minutes as described above. Resuscitation of hemorrhagic shock. Placement L CT. Patient taken from CT to OR for emergent thoracotomy with Dr. Lawson Fiscal. CTA reviewed with radiology. Patient examined and I agree with the assessment and plan  Georganna Skeans, MD, MPH, FACS Trauma: [phone] General Surgery: [phone]  11/08/2013 10:03 AM

## 2013-11-08 NOTE — Progress Notes (Signed)
Pt's girlfriend requesting pt's belongings. No belongings were sent to [facility] with pt. ED called to ask about pt's belongings-supposed to call unit back.  Fulton Reek, RN
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
## 2013-11-08 NOTE — ED Notes (Signed)
3rd unit PRBC started on pt and pt taken to OR from CT.

## 2013-11-08 NOTE — Transfer of Care (Signed)
Immediate Anesthesia Transfer of Care Note  Patient: Jeremy [name]  Procedure(s) Performed: Procedure(s): LEFT THORACOTOMY (Left)  Patient Location: ICU  Anesthesia Type:General  Level of Consciousness: sedated and Patient remains intubated per anesthesia plan  Airway & Oxygen Therapy: Patient remains intubated per anesthesia plan and Patient placed on Ventilator (see vital sign flow sheet for setting)  Post-op Assessment: Report given to PACU RN and Post -op Vital signs reviewed and stable  Post vital signs: Reviewed and stable  Complications: No apparent anesthesia complications
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
## 2013-11-08 NOTE — Anesthesia Preprocedure Evaluation (Signed)
Anesthesia Evaluation  Patient identified by MRN, date of birth, ID band Patient awake  General Assessment Comment:Pt emergently brought to OR.  Reviewed: Allergy & Precautions, NPO status , Patient's Chart, lab work & pertinent test results  History of Anesthesia Complications Negative for: history of anesthetic complications  Airway Mallampati: III TM Distance: >3 FB Neck ROM: Full    Dental  (+) Teeth Intact, Dental Advisory Given, Poor Dentition   Pulmonary asthma , Current Smoker,  breath sounds clear to auscultation- rhonchi  Pulmonary exam normal + decreased breath sounds      Cardiovascular hypertension, Rhythm:Regular Rate:Tachycardia     Neuro/Psych    GI/Hepatic   Endo/Other  diabetes  Renal/GU      Musculoskeletal   Abdominal   Peds  Hematology   Anesthesia Other Findings   Reproductive/Obstetrics                           Anesthesia Physical Anesthesia Plan  ASA: III and emergent  Anesthesia Plan: General   Post-op Pain Management:    Induction: Intravenous, Rapid sequence and Cricoid pressure planned  Airway Management Planned: Oral ETT and Double Lumen EBT  Additional Equipment: Arterial line, Ultrasound Guidance Line Placement and CVP  Intra-op Plan:   Post-operative Plan: Post-operative intubation/ventilation  Informed Consent:   Only emergency history available and Dental advisory given  Plan Discussed with:   Anesthesia Plan Comments:         Anesthesia Quick Evaluation

## 2013-11-08 NOTE — Anesthesia Procedure Notes (Addendum)
Procedure Name: Intubation Date/Time: 11/08/2013 10:10 AM Performed by: Ned Grace Pre-anesthesia Checklist: Patient identified, Patient being monitored, Emergency Drugs available, Timeout performed and Suction available Patient Re-evaluated:Patient Re-evaluated prior to inductionOxygen Delivery Method: Circle system utilized Preoxygenation: Pre-oxygenation with 100% oxygen Intubation Type: IV induction, Rapid sequence and Cricoid Pressure applied Laryngoscope Size: Mac and 4 Grade View: Grade I Endobronchial tube: Left, EBT position confirmed by auscultation, Double lumen EBT and EBT position confirmed by fiberoptic bronchoscope and 39 Fr Number of attempts: 1 Airway Equipment and Method: Stylet and Fiberoptic brochoscope Placement Confirmation: ETT inserted through vocal cords under direct vision,  breath sounds checked- equal and bilateral and positive ETCO2 Tube secured with: Tape Dental Injury: Teeth and Oropharynx as per pre-operative assessment  Comments: SIVI by Dr Oletta Lamas. DLx1 with Mac 4 Gr I view, DLT easily passed through [facility] by Bethann Humble CRNA. DLT advanced endobronchially and placement confirmed by Dr Oletta Lamas.     Procedure Name: Intubation Date/Time: 11/08/2013 1:01 PM Performed by: Ned Grace Pre-anesthesia Checklist: Patient identified, Patient being monitored, Emergency Drugs available, Timeout performed and Suction available Patient Re-evaluated:Patient Re-evaluated prior to inductionOxygen Delivery Method: Circle system utilized Preoxygenation: Pre-oxygenation with 100% oxygen Intubation Type: IV induction Tube type: Subglottic suction tube Tube size: 8.5 mm Number of attempts: 1 Airway Equipment and Method: Stylet,  Video-laryngoscopy and Bite block Placement Confirmation: ETT inserted through vocal cords under direct vision,  breath sounds checked- equal and bilateral and positive ETCO2 Secured at: 23 cm Tube secured with: Tape Dental  Injury: Teeth and Oropharynx as per pre-operative assessment  Comments: Using GS #4 for VL, Cook catheter advanced through DLT. DLT withdrawn, and 8.5 ETT advanced over [facility] catheter. BS=B, +etCO2, Cook catheter removed. OPA inserted for transport to ICU per Dr Oletta Lamas.   RIJ CVP Dual Limen: [id number]: The patient was identified and consent obtained.  TO was performed, and full barrier precautions were used.  The skin was anesthetized with lidocaine.  Once the vein was located with the 22 ga. needle using ultrasound guidance , the wire was inserted into the vein.  The wire location was confirmed with ultrasound.  The tissue was dilated and the catheter was carefully inserted, then sutured in place. A dressing was applied. The patient tolerated the procedure well.  CE
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
## 2013-11-09 ENCOUNTER — Inpatient Hospital Stay (HOSPITAL_COMMUNITY): Payer: Self-pay

## 2013-11-09 ENCOUNTER — Encounter (HOSPITAL_COMMUNITY): Payer: Self-pay | Admitting: Cardiothoracic Surgery

## 2013-11-09 DIAGNOSIS — R7309 Other abnormal glucose: Secondary | ICD-10-CM

## 2013-11-09 DIAGNOSIS — J95821 Acute postprocedural respiratory failure: Secondary | ICD-10-CM

## 2013-11-09 LAB — POCT I-STAT 4, (NA,K, GLUC, HGB,HCT)
Glucose, Bld: 470 mg/dL — ABNORMAL HIGH (ref 70–99)
HCT: 34 % — ABNORMAL LOW (ref 39.0–52.0)
Hemoglobin: 11.6 g/dL — ABNORMAL LOW (ref 13.0–17.0)
Potassium: 6.2 mEq/L — ABNORMAL HIGH (ref 3.7–5.3)
Sodium: 137 mEq/L (ref 137–147)

## 2013-11-09 LAB — URINALYSIS, ROUTINE W REFLEX MICROSCOPIC
Bilirubin Urine: NEGATIVE
GLUCOSE, UA: 100 mg/dL — AB
KETONES UR: NEGATIVE mg/dL
Leukocytes, UA: NEGATIVE
NITRITE: NEGATIVE
PH: 5.5 (ref 5.0–8.0)
Protein, ur: NEGATIVE mg/dL
Specific Gravity, Urine: 1.027 (ref 1.005–1.030)
Urobilinogen, UA: 0.2 mg/dL (ref 0.0–1.0)

## 2013-11-09 LAB — GLUCOSE, CAPILLARY
Glucose-Capillary: 110 mg/dL — ABNORMAL HIGH (ref 70–99)
Glucose-Capillary: 116 mg/dL — ABNORMAL HIGH (ref 70–99)
Glucose-Capillary: 118 mg/dL — ABNORMAL HIGH (ref 70–99)
Glucose-Capillary: 124 mg/dL — ABNORMAL HIGH (ref 70–99)
Glucose-Capillary: 151 mg/dL — ABNORMAL HIGH (ref 70–99)
Glucose-Capillary: 154 mg/dL — ABNORMAL HIGH (ref 70–99)
Glucose-Capillary: 156 mg/dL — ABNORMAL HIGH (ref 70–99)
Glucose-Capillary: 203 mg/dL — ABNORMAL HIGH (ref 70–99)
Glucose-Capillary: 227 mg/dL — ABNORMAL HIGH (ref 70–99)
Glucose-Capillary: 241 mg/dL — ABNORMAL HIGH (ref 70–99)
Glucose-Capillary: 255 mg/dL — ABNORMAL HIGH (ref 70–99)

## 2013-11-09 LAB — POCT I-STAT 7, (LYTES, BLD GAS, ICA,H+H)
Acid-Base Excess: 1 mmol/L (ref 0.0–2.0)
Acid-base deficit: 5 mmol/L — ABNORMAL HIGH (ref 0.0–2.0)
Bicarbonate: 22.8 mEq/L (ref 20.0–24.0)
Bicarbonate: 27.2 mEq/L — ABNORMAL HIGH (ref 20.0–24.0)
Calcium, Ion: 1.01 mmol/L — ABNORMAL LOW (ref 1.12–1.23)
Calcium, Ion: 1.07 mmol/L — ABNORMAL LOW (ref 1.12–1.23)
HCT: 27 % — ABNORMAL LOW (ref 39.0–52.0)
HCT: 35 % — ABNORMAL LOW (ref 39.0–52.0)
Hemoglobin: 11.9 g/dL — ABNORMAL LOW (ref 13.0–17.0)
Hemoglobin: 9.2 g/dL — ABNORMAL LOW (ref 13.0–17.0)
O2 Saturation: 100 %
O2 Saturation: 99 %
Patient temperature: 35.1
Patient temperature: 35.1
Potassium: 5.6 mEq/L — ABNORMAL HIGH (ref 3.7–5.3)
Potassium: 6 mEq/L — ABNORMAL HIGH (ref 3.7–5.3)
Sodium: 139 mEq/L (ref 137–147)
Sodium: 140 mEq/L (ref 137–147)
TCO2: 24 mmol/L (ref 0–100)
TCO2: 29 mmol/L (ref 0–100)
pCO2 arterial: 44.7 mmHg (ref 35.0–45.0)
pCO2 arterial: 48.8 mmHg — ABNORMAL HIGH (ref 35.0–45.0)
pH, Arterial: 7.268 — ABNORMAL LOW (ref 7.350–7.450)
pH, Arterial: 7.384 (ref 7.350–7.450)
pO2, Arterial: 160 mmHg — ABNORMAL HIGH (ref 80.0–100.0)
pO2, Arterial: 388 mmHg — ABNORMAL HIGH (ref 80.0–100.0)

## 2013-11-09 LAB — PREPARE FRESH FROZEN PLASMA
UNIT DIVISION: 0
Unit division: 0

## 2013-11-09 LAB — BLOOD GAS, ARTERIAL
Acid-Base Excess: 3.4 mmol/L — ABNORMAL HIGH (ref 0.0–2.0)
BICARBONATE: 27.7 meq/L — AB (ref 20.0–24.0)
DRAWN BY: 36496
FIO2: 0.5 %
MECHVT: 700 mL
O2 SAT: 99.7 %
PATIENT TEMPERATURE: 98.1
PEEP: 5 cmH2O
PO2 ART: 180 mmHg — AB (ref 80.0–100.0)
RATE: 12 resp/min
TCO2: 29 mmol/L (ref 0–100)
pCO2 arterial: 43.2 mmHg (ref 35.0–45.0)
pH, Arterial: 7.421 (ref 7.350–7.450)

## 2013-11-09 LAB — URINE MICROSCOPIC-ADD ON

## 2013-11-09 LAB — CBC
HCT: 28.3 % — ABNORMAL LOW (ref 39.0–52.0)
HEMOGLOBIN: 9.8 g/dL — AB (ref 13.0–17.0)
MCH: 27.6 pg (ref 26.0–34.0)
MCHC: 34.6 g/dL (ref 30.0–36.0)
MCV: 79.7 fL (ref 78.0–100.0)
PLATELETS: 167 10*3/uL (ref 150–400)
RBC: 3.55 MIL/uL — ABNORMAL LOW (ref 4.22–5.81)
RDW: 13.6 % (ref 11.5–15.5)
WBC: 12 10*3/uL — AB (ref 4.0–10.5)

## 2013-11-09 LAB — HEMOGLOBIN AND HEMATOCRIT, BLOOD
HCT: 28.4 % — ABNORMAL LOW (ref 39.0–52.0)
Hemoglobin: 9.6 g/dL — ABNORMAL LOW (ref 13.0–17.0)

## 2013-11-09 LAB — BASIC METABOLIC PANEL
BUN: 7 mg/dL (ref 6–23)
CALCIUM: 7.6 mg/dL — AB (ref 8.4–10.5)
CO2: 27 mEq/L (ref 19–32)
Chloride: 107 mEq/L (ref 96–112)
Creatinine, Ser: 0.72 mg/dL (ref 0.50–1.35)
GFR calc Af Amer: >90 mL/min (ref >90)
GFR calc non Af Amer: >90 mL/min (ref >90)
Glucose, Bld: 164 mg/dL — ABNORMAL HIGH (ref 70–99)
Potassium: 3.1 mEq/L — ABNORMAL LOW (ref 3.7–5.3)
SODIUM: 145 meq/L (ref 137–147)

## 2013-11-09 LAB — POCT I-STAT GLUCOSE
Glucose, Bld: 437 mg/dL — ABNORMAL HIGH (ref 70–99)
Operator id: 318441

## 2013-11-09 MED ORDER — INSULIN ASPART 100 UNIT/ML ~~LOC~~ SOLN
6.0000 [IU] | Freq: Three times a day (TID) | SUBCUTANEOUS | Status: DC
  Administered 2013-11-09 – 2013-11-12 (×9): 6 [IU] via SUBCUTANEOUS

## 2013-11-09 MED ORDER — LEVALBUTEROL HCL 0.63 MG/3ML IN NEBU
0.6300 mg | INHALATION_SOLUTION | Freq: Three times a day (TID) | RESPIRATORY_TRACT | Status: DC
  Administered 2013-11-09 – 2013-11-11 (×5): 0.63 mg via RESPIRATORY_TRACT
  Filled 2013-11-09 (×10): qty 3

## 2013-11-09 MED ORDER — INSULIN ASPART 100 UNIT/ML ~~LOC~~ SOLN
0.0000 [IU] | Freq: Three times a day (TID) | SUBCUTANEOUS | Status: DC
  Administered 2013-11-09 (×3): 7 [IU] via SUBCUTANEOUS
  Administered 2013-11-10: 4 [IU] via SUBCUTANEOUS
  Administered 2013-11-10 – 2013-11-11 (×3): 7 [IU] via SUBCUTANEOUS
  Administered 2013-11-11: 11 [IU] via SUBCUTANEOUS
  Administered 2013-11-12 (×2): 7 [IU] via SUBCUTANEOUS

## 2013-11-09 MED ORDER — POTASSIUM CHLORIDE 10 MEQ/50ML IV SOLN
10.0000 meq | INTRAVENOUS | Status: AC | PRN
  Administered 2013-11-09 (×3): 10 meq via INTRAVENOUS
  Filled 2013-11-09 (×3): qty 50

## 2013-11-09 MED ORDER — SODIUM CHLORIDE 0.9 % IJ SOLN: 9.0000 mL | INTRAMUSCULAR | Status: DC | PRN

## 2013-11-09 MED ORDER — NALOXONE HCL 0.4 MG/ML IJ SOLN: 0.4000 mg | INTRAMUSCULAR | Status: DC | PRN

## 2013-11-09 MED ORDER — CHLORHEXIDINE GLUCONATE 0.12 % MT SOLN
OROMUCOSAL | Status: AC
  Administered 2013-11-09: 15 mL
  Filled 2013-11-09: qty 15

## 2013-11-09 MED ORDER — PNEUMOCOCCAL VAC POLYVALENT 25 MCG/0.5ML IJ INJ
0.5000 mL | INJECTION | INTRAMUSCULAR | Status: DC | PRN
Start: 2013-11-09 — End: 2013-11-12

## 2013-11-09 MED ORDER — LEVALBUTEROL HCL 0.63 MG/3ML IN NEBU: 0.6300 mg | INHALATION_SOLUTION | Freq: Four times a day (QID) | RESPIRATORY_TRACT | Status: DC | PRN

## 2013-11-09 MED ORDER — METOPROLOL TARTRATE 25 MG PO TABS
25.0000 mg | ORAL_TABLET | Freq: Two times a day (BID) | ORAL | Status: DC
  Administered 2013-11-10 – 2013-11-12 (×6): 25 mg via ORAL
  Filled 2013-11-09 (×7): qty 1

## 2013-11-09 MED ORDER — POTASSIUM CHLORIDE 10 MEQ/50ML IV SOLN
10.0000 meq | INTRAVENOUS | Status: AC | PRN
  Administered 2013-11-09 (×3): 10 meq via INTRAVENOUS

## 2013-11-09 MED ORDER — DIPHENHYDRAMINE HCL 12.5 MG/5ML PO ELIX
12.5000 mg | ORAL_SOLUTION | Freq: Four times a day (QID) | ORAL | Status: DC | PRN
  Filled 2013-11-09: qty 5

## 2013-11-09 MED ORDER — INSULIN DETEMIR 100 UNIT/ML ~~LOC~~ SOLN
20.0000 [IU] | Freq: Once | SUBCUTANEOUS | Status: AC
  Administered 2013-11-09: 20 [IU] via SUBCUTANEOUS
  Filled 2013-11-09: qty 0.2

## 2013-11-09 MED ORDER — INFLUENZA VAC SPLIT QUAD 0.5 ML IM SUSP: 0.5000 mL | INTRAMUSCULAR | Status: DC | PRN

## 2013-11-09 MED ORDER — HYDROMORPHONE 0.3 MG/ML IV SOLN
INTRAVENOUS | Status: DC
  Administered 2013-11-09: 10:00:00 via INTRAVENOUS
  Administered 2013-11-09: 3.1 mg via INTRAVENOUS
  Administered 2013-11-09: 3.25 mg via INTRAVENOUS
  Administered 2013-11-09: 16:00:00 via INTRAVENOUS
  Administered 2013-11-09: 3.25 mg via INTRAVENOUS
  Administered 2013-11-10: 4.2 mg via INTRAVENOUS
  Administered 2013-11-10: 1.5 mg via INTRAVENOUS
  Administered 2013-11-10: 0.6 mg via INTRAVENOUS
  Administered 2013-11-10: 3.6 mg via INTRAVENOUS
  Administered 2013-11-10: 0.9 mg via INTRAVENOUS
  Administered 2013-11-11: 0.6 mg via INTRAVENOUS
  Administered 2013-11-11: 07:00:00 via INTRAVENOUS
  Administered 2013-11-11: 0.6 mg via INTRAVENOUS
  Administered 2013-11-11: 0.3 mg via INTRAVENOUS
  Administered 2013-11-11: 1.02 mg via INTRAVENOUS
  Administered 2013-11-11: 1.8 mg via INTRAVENOUS
  Administered 2013-11-11: 0.9 mg via INTRAVENOUS
  Administered 2013-11-12: 0.3 mg via INTRAVENOUS
  Filled 2013-11-09 (×4): qty 25

## 2013-11-09 MED ORDER — DIPHENHYDRAMINE HCL 50 MG/ML IJ SOLN
12.5000 mg | Freq: Four times a day (QID) | INTRAMUSCULAR | Status: DC | PRN
  Administered 2013-11-11: 12.5 mg via INTRAVENOUS
  Filled 2013-11-09 (×2): qty 1

## 2013-11-09 MED FILL — Sodium Chloride IV Soln 0.9%: INTRAVENOUS | Qty: 2000 | Status: AC

## 2013-11-09 NOTE — Progress Notes (Signed)
Pt's HR currently sustaining ST 120s.  Pt's pain level currently 3/10.  SBP 150s-160s.  Dr. Hulen Skains notified.  Orders received, will continue to monitor.  Vista Lawman, RN

## 2013-11-09 NOTE — Progress Notes (Signed)
Inpatient Diabetes Program Recommendations  AACE/ADA: New Consensus Statement on Inpatient Glycemic Control (2013)  Target Ranges:  Prepandial:   less than 140 mg/dL      Peak postprandial:   less than 180 mg/dL (1-2 hours)      Critically ill patients:  140 - 180 mg/dL   Results for Jeremy [name], Jeremy [name] (MRN [id number]) as of 11/09/2013 11:58  Ref. Range 11/09/2013 00:52 11/09/2013 01:59 11/09/2013 03:05 11/09/2013 04:03 11/09/2013 06:03  Glucose-Capillary Latest Range: 70-99 mg/dL 156 (H) 110 (H) 151 (H) 154 (H) 116 (H)   Diabetes history: DM2 Outpatient Diabetes medications: Metformin 1000 mg BID (per patient) Current orders for Inpatient glycemic control: Novolog 0-20 units AC, Novolog 6 units TID with meals  Inpatient Diabetes Program Recommendations Insulin - Basal: Please consider ordering Levemir 20 units Q24H (starting now). HgbA1C: Please order an A1C to evaluate glycemic control over the past 2-3 months.  Note: Patient has transitioned off the insulin drip to SQ insulin. Talked with Sharrie Rothman, RN and she reports that last glucose was 207 mg/dl about 2 hours after insulin drip was stopped and patient was given Novolog 7 units for correction. Over the last 12 hours on the insulin drip, patient required 52.5 units. Therefore, anticipate patient will need basal insulin. Please consider ordering Levemir 20 units Q24H and an A1C. Will continue to follow.  Thanks, Barnie Alderman, RN, MSN, CCRN Diabetes Coordinator Inpatient Diabetes Program [phone] (Team Pager) [phone] ([facility] office) [phone] [facility] office)

## 2013-11-09 NOTE — Progress Notes (Signed)
TCTS BRIEF SICU PROGRESS NOTE  1 Day Post-Op  S/P Procedure(s) (LRB): LEFT THORACOTOMY (Left)   Stable day NSR w/ stable BP O2 sats 91-95% on 2 L/min UOP adequate  Plan: Continue current plan  Honestii Marton H 11/09/2013 5:51 PM
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
## 2013-11-09 NOTE — Procedures (Signed)
Extubation Procedure Note  Patient Details:   Name: Jeremy [name] DOB: August 03, 1981 MRN: [id number]   Airway Documentation:     Evaluation  O2 sats: stable throughout Complications: No apparent complications Patient did tolerate procedure well. Bilateral Breath Sounds: Rhonchi Suctioning: Oral Yes pt able to vocalize.  Pt extubated at this time per MD order. No complications. Pt able to breathe around deflated cuff. VS stable. No stridor noted. Pt has strong, adequate cough. RT will continue to monitor.   Irineo Axon [facility] 11/09/2013, 8:26 AM

## 2013-11-09 NOTE — Progress Notes (Signed)
Inpatient Diabetes Program Recommendations  AACE/ADA: New Consensus Statement on Inpatient Glycemic Control (2013)  Target Ranges:  Prepandial:   less than 140 mg/dL      Peak postprandial:   less than 180 mg/dL (1-2 hours)      Critically ill patients:  140 - 180 mg/dL  Results for ABDURREHMAN, Jeremy [name] (MRN [id number]) as of 11/09/2013 07:52  Ref. Range 11/08/2013 08:47  Glucose Latest Range: 70-99 mg/dL 517 (H)   Results for Jeremy [name], Jeremy [name] (MRN [id number]) as of 11/09/2013 07:52  Ref. Range 11/08/2013 23:01 11/09/2013 00:06 11/09/2013 00:52 11/09/2013 01:59 11/09/2013 03:05 11/09/2013 04:03 11/09/2013 06:03  Glucose-Capillary Latest Range: 70-99 mg/dL 185 (H) 124 (H) 156 (H) 110 (H) 151 (H) 154 (H) 116 (H)   Diabetes history: DM2 Outpatient Diabetes medications: None listed on home medication list  Current orders for Inpatient glycemic control: Novolin R insulin drip via Glucostabilizer  Inpatient Diabetes Program Recommendations HgbA1C: Please order an A1C to evaluate glycemic control over the past 2-3 months.  Thanks, Barnie Alderman, RN, MSN, CCRN Diabetes Coordinator Inpatient Diabetes Program [phone] (Team Pager) [phone] (AP office) [phone] [facility] office)

## 2013-11-09 NOTE — Progress Notes (Signed)
Bladder scan shows 783 mL urine in patient's bladder.  MD Roxy Manns made aware. Order to insert foley catheter.  Went into room to insert foley; patient voided 400 mL.  Will not insert foley at present time. Will continue to monitor. MD Roxy Manns also made aware of high blood sugars; one time dose of levemir ordered.  Will continue to monitor.

## 2013-11-10 ENCOUNTER — Inpatient Hospital Stay (HOSPITAL_COMMUNITY): Payer: Self-pay

## 2013-11-10 DIAGNOSIS — I10 Essential (primary) hypertension: Secondary | ICD-10-CM

## 2013-11-10 DIAGNOSIS — E119 Type 2 diabetes mellitus without complications: Secondary | ICD-10-CM

## 2013-11-10 DIAGNOSIS — E876 Hypokalemia: Secondary | ICD-10-CM

## 2013-11-10 LAB — COMPREHENSIVE METABOLIC PANEL
ALT: 17 U/L (ref 0–53)
AST: 35 U/L (ref 0–37)
Albumin: 2.8 g/dL — ABNORMAL LOW (ref 3.5–5.2)
Alkaline Phosphatase: 38 U/L — ABNORMAL LOW (ref 39–117)
BUN: 5 mg/dL — ABNORMAL LOW (ref 6–23)
CHLORIDE: 103 meq/L (ref 96–112)
CO2: 27 meq/L (ref 19–32)
CREATININE: 0.67 mg/dL (ref 0.50–1.35)
Calcium: 7.8 mg/dL — ABNORMAL LOW (ref 8.4–10.5)
GLUCOSE: 141 mg/dL — AB (ref 70–99)
Potassium: 3.4 mEq/L — ABNORMAL LOW (ref 3.7–5.3)
Sodium: 140 mEq/L (ref 137–147)
Total Bilirubin: 0.4 mg/dL (ref 0.3–1.2)
Total Protein: 5.9 g/dL — ABNORMAL LOW (ref 6.0–8.3)

## 2013-11-10 LAB — CBC
HCT: 26.4 % — ABNORMAL LOW (ref 39.0–52.0)
HEMOGLOBIN: 9.1 g/dL — AB (ref 13.0–17.0)
MCH: 28.2 pg (ref 26.0–34.0)
MCHC: 34.5 g/dL (ref 30.0–36.0)
MCV: 81.7 fL (ref 78.0–100.0)
Platelets: 175 10*3/uL (ref 150–400)
RBC: 3.23 MIL/uL — AB (ref 4.22–5.81)
RDW: 13.9 % (ref 11.5–15.5)
WBC: 15.1 10*3/uL — AB (ref 4.0–10.5)

## 2013-11-10 LAB — GLUCOSE, CAPILLARY
Glucose-Capillary: 207 mg/dL — ABNORMAL HIGH (ref 70–99)
Glucose-Capillary: 168 mg/dL — ABNORMAL HIGH (ref 70–99)
Glucose-Capillary: 228 mg/dL — ABNORMAL HIGH (ref 70–99)
Glucose-Capillary: 229 mg/dL — ABNORMAL HIGH (ref 70–99)

## 2013-11-10 MED ORDER — PANTOPRAZOLE SODIUM 40 MG PO TBEC
40.0000 mg | DELAYED_RELEASE_TABLET | Freq: Every day | ORAL | Status: DC
  Administered 2013-11-11 – 2013-11-12 (×2): 40 mg via ORAL
  Filled 2013-11-10 (×2): qty 1

## 2013-11-10 MED ORDER — DEXTROSE 5 % IV SOLN
1.5000 g | Freq: Two times a day (BID) | INTRAVENOUS | Status: AC
  Administered 2013-11-10 – 2013-11-11 (×4): 1.5 g via INTRAVENOUS
  Filled 2013-11-10 (×5): qty 1.5

## 2013-11-10 MED ORDER — METFORMIN HCL 500 MG PO TABS
500.0000 mg | ORAL_TABLET | Freq: Two times a day (BID) | ORAL | Status: DC
  Administered 2013-11-11 – 2013-11-12 (×3): 500 mg via ORAL
  Filled 2013-11-10 (×6): qty 1

## 2013-11-10 MED ORDER — OXYCODONE HCL 5 MG PO TABS
5.0000 mg | ORAL_TABLET | ORAL | Status: DC | PRN
  Administered 2013-11-10 – 2013-11-12 (×5): 10 mg via ORAL
  Filled 2013-11-10 (×5): qty 2

## 2013-11-10 MED ORDER — ACETAMINOPHEN 500 MG PO TABS
1000.0000 mg | ORAL_TABLET | Freq: Four times a day (QID) | ORAL | Status: DC | PRN
  Administered 2013-11-10: 975 mg via ORAL

## 2013-11-10 MED ORDER — POTASSIUM CHLORIDE CRYS ER 20 MEQ PO TBCR
20.0000 meq | EXTENDED_RELEASE_TABLET | Freq: Two times a day (BID) | ORAL | Status: AC
  Administered 2013-11-10 (×2): 20 meq via ORAL
  Filled 2013-11-10 (×2): qty 1

## 2013-11-10 MED ORDER — LISINOPRIL 5 MG PO TABS
5.0000 mg | ORAL_TABLET | Freq: Every day | ORAL | Status: DC
  Administered 2013-11-10 – 2013-11-12 (×3): 5 mg via ORAL
  Filled 2013-11-10 (×3): qty 1
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
## 2013-11-10 NOTE — Progress Notes (Signed)
PT Cancellation Note  Patient Details Name: Landynn Engert MRN: [id number] DOB: 14-Dec-1980   Cancelled Treatment:    Reason Eval/Treat Not Completed: Patient declined due to headache, not feeling well. 11/10/2013  Donnella Sham, [place] [phone]  (pager)   Makinzi Prieur, Tessie Fass 11/10/2013, 3:26 PM

## 2013-11-10 NOTE — Progress Notes (Signed)
Inpatient Diabetes Program Recommendations  AACE/ADA: New Consensus Statement on Inpatient Glycemic Control (2013)  Target Ranges:  Prepandial:   less than 140 mg/dL      Peak postprandial:   less than 180 mg/dL (1-2 hours)      Critically ill patients:  140 - 180 mg/dL   Reason for Visit:  Results for DYMERE, GLUNT (MRN [id number]) as of 11/10/2013 11:13  Ref. Range 11/10/2013 08:02  Glucose-Capillary Latest Range: 70-99 mg/dL 168 (H)   CBG improved this morning.  May consider continuation of Levemir 20 units daily.    Thanks, Adah Perl, RN, BC-ADM Inpatient Diabetes Coordinator Pager [phone]

## 2013-11-10 NOTE — Progress Notes (Signed)
Pt arrived from 2S, VSS, c/o nausea, admin zofran, will continue to monitor.
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
## 2013-11-10 NOTE — Plan of Care (Signed)
Problem: Phase I Progression Outcomes Goal: Initial discharge plan identified Outcome: Completed/Met Date Met:  11/10/13 Home with wife/girlfriend  Problem: Phase II Progression Outcomes Goal: Discharge plan established Outcome: Completed/Met Date Met:  11/10/13 Home with significant other.
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
## 2013-11-11 ENCOUNTER — Inpatient Hospital Stay (HOSPITAL_COMMUNITY): Payer: Self-pay

## 2013-11-11 DIAGNOSIS — S2190XA Unspecified open wound of unspecified part of thorax, initial encounter: Secondary | ICD-10-CM | POA: Diagnosis present

## 2013-11-11 DIAGNOSIS — R571 Hypovolemic shock: Secondary | ICD-10-CM | POA: Diagnosis present

## 2013-11-11 DIAGNOSIS — S27339A Laceration of lung, unspecified, initial encounter: Secondary | ICD-10-CM

## 2013-11-11 DIAGNOSIS — J45909 Unspecified asthma, uncomplicated: Secondary | ICD-10-CM | POA: Insufficient documentation

## 2013-11-11 DIAGNOSIS — I10 Essential (primary) hypertension: Secondary | ICD-10-CM | POA: Insufficient documentation

## 2013-11-11 DIAGNOSIS — S272XXA Traumatic hemopneumothorax, initial encounter: Secondary | ICD-10-CM | POA: Diagnosis present

## 2013-11-11 DIAGNOSIS — D62 Acute posthemorrhagic anemia: Secondary | ICD-10-CM

## 2013-11-11 DIAGNOSIS — E119 Type 2 diabetes mellitus without complications: Secondary | ICD-10-CM | POA: Insufficient documentation

## 2013-11-11 HISTORY — DX: Acute posthemorrhagic anemia: D62

## 2013-11-11 LAB — CBC
HEMATOCRIT: 24.9 % — AB (ref 39.0–52.0)
Hemoglobin: 8.6 g/dL — ABNORMAL LOW (ref 13.0–17.0)
MCH: 28.3 pg (ref 26.0–34.0)
MCHC: 34.5 g/dL (ref 30.0–36.0)
MCV: 81.9 fL (ref 78.0–100.0)
Platelets: 176 10*3/uL (ref 150–400)
RBC: 3.04 MIL/uL — AB (ref 4.22–5.81)
RDW: 13.9 % (ref 11.5–15.5)
WBC: 10.8 10*3/uL — ABNORMAL HIGH (ref 4.0–10.5)

## 2013-11-11 LAB — BASIC METABOLIC PANEL
BUN: 6 mg/dL (ref 6–23)
CHLORIDE: 101 meq/L (ref 96–112)
CO2: 25 meq/L (ref 19–32)
Calcium: 7.9 mg/dL — ABNORMAL LOW (ref 8.4–10.5)
Creatinine, Ser: 0.71 mg/dL (ref 0.50–1.35)
GFR calc Af Amer: >90 mL/min (ref >90)
GFR calc non Af Amer: >90 mL/min (ref >90)
GLUCOSE: 196 mg/dL — AB (ref 70–99)
POTASSIUM: 3.4 meq/L — AB (ref 3.7–5.3)
SODIUM: 138 meq/L (ref 137–147)

## 2013-11-11 LAB — GLUCOSE, CAPILLARY
Glucose-Capillary: 252 mg/dL — ABNORMAL HIGH (ref 70–99)
Glucose-Capillary: 236 mg/dL — ABNORMAL HIGH (ref 70–99)
Glucose-Capillary: 203 mg/dL — ABNORMAL HIGH (ref 70–99)
Glucose-Capillary: 247 mg/dL — ABNORMAL HIGH (ref 70–99)

## 2013-11-11 MED ORDER — SORBITOL 70 % SOLN
60.0000 mL | Freq: Every day | Status: DC | PRN
  Filled 2013-11-11: qty 60

## 2013-11-11 MED ORDER — FE FUMARATE-B12-VIT C-FA-IFC PO CAPS
1.0000 | ORAL_CAPSULE | Freq: Three times a day (TID) | ORAL | Status: DC
  Administered 2013-11-11 – 2013-11-12 (×4): 1 via ORAL
  Filled 2013-11-11 (×7): qty 1

## 2013-11-11 MED ORDER — INSULIN DETEMIR 100 UNIT/ML ~~LOC~~ SOLN
20.0000 [IU] | Freq: Every day | SUBCUTANEOUS | Status: DC
  Administered 2013-11-11: 20 [IU] via SUBCUTANEOUS
  Filled 2013-11-11 (×2): qty 0.2

## 2013-11-11 MED ORDER — KETOROLAC TROMETHAMINE 30 MG/ML IJ SOLN
30.0000 mg | Freq: Once | INTRAMUSCULAR | Status: AC
  Administered 2013-11-11: 30 mg via INTRAVENOUS
  Filled 2013-11-11: qty 1

## 2013-11-11 MED ORDER — KETOROLAC TROMETHAMINE 15 MG/ML IJ SOLN
15.0000 mg | Freq: Four times a day (QID) | INTRAMUSCULAR | Status: DC
  Administered 2013-11-11 – 2013-11-12 (×5): 15 mg via INTRAVENOUS
  Filled 2013-11-11 (×9): qty 1

## 2013-11-11 NOTE — Progress Notes (Signed)
Inpatient Diabetes Program Recommendations  AACE/ADA: New Consensus Statement on Inpatient Glycemic Control (2013)  Target Ranges:  Prepandial:   less than 140 mg/dL      Peak postprandial:   less than 180 mg/dL (1-2 hours)      Critically ill patients:  140 - 180 mg/dL   Reason for Visit: Results for DEVAM, CERAVOLO (MRN [id number]) as of 11/11/2013 14:27  Ref. Range 11/10/2013 08:02 11/10/2013 11:37 11/10/2013 21:35 11/11/2013 07:42 11/11/2013 13:06  Glucose-Capillary Latest Range: 70-99 mg/dL 168 (H) 228 (H) 229 (H) 252 (H) 236 (H)   Called and discussed with Dr. Hulen Skains.  Orders received for Levemir 20 units q HS.  Also received order for A1C.  WIll follow.  Adah Perl, RN, BC-ADM Inpatient Diabetes Coordinator Pager [phone]
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
## 2013-11-11 NOTE — Progress Notes (Signed)
UR completed. No HH Recommendations from acute PT evals.  Sandi Mariscal, RN BSN [place] CCM Trauma/Neuro ICU Case Manager [phone]
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
## 2013-11-11 NOTE — Progress Notes (Signed)
PT Cancellation Note  Patient Details Name: Jeremy [name] MRN: [id number] DOB: 1981/03/13   Cancelled Treatment:    Reason Eval/Treat Not Completed: Patient declined, no reason specified. Pt refusing to ambulate this morning. Max encouragement given with benefits of mobility verbalized to pt. Pt stated "i will walk later today just  Not right now." will re-attempt to see pt later today to progress mobility.    Elie Confer [place], [place] 11/11/2013, 8:06 AM
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
## 2013-11-12 ENCOUNTER — Inpatient Hospital Stay (HOSPITAL_COMMUNITY): Payer: Self-pay

## 2013-11-12 LAB — TYPE AND SCREEN
ABO/RH(D): A POS
ANTIBODY SCREEN: NEGATIVE
UNIT DIVISION: 0
UNIT DIVISION: 0
Unit division: 0
Unit division: 0
Unit division: 0
Unit division: 0

## 2013-11-12 LAB — GLUCOSE, CAPILLARY
Glucose-Capillary: 233 mg/dL — ABNORMAL HIGH (ref 70–99)
Glucose-Capillary: 225 mg/dL — ABNORMAL HIGH (ref 70–99)
Glucose-Capillary: 273 mg/dL — ABNORMAL HIGH (ref 70–99)

## 2013-11-12 LAB — BASIC METABOLIC PANEL
BUN: 14 mg/dL (ref 6–23)
CHLORIDE: 106 meq/L (ref 96–112)
CO2: 26 meq/L (ref 19–32)
CREATININE: 0.82 mg/dL (ref 0.50–1.35)
Calcium: 7.8 mg/dL — ABNORMAL LOW (ref 8.4–10.5)
GFR calc Af Amer: >90 mL/min (ref >90)
GFR calc non Af Amer: >90 mL/min (ref >90)
Glucose, Bld: 243 mg/dL — ABNORMAL HIGH (ref 70–99)
POTASSIUM: 3.7 meq/L (ref 3.7–5.3)
Sodium: 141 mEq/L (ref 137–147)

## 2013-11-12 LAB — HEMOGLOBIN A1C
Hgb A1c MFr Bld: 9.7 % — ABNORMAL HIGH (ref <5.7)
Mean Plasma Glucose: 232 mg/dL — ABNORMAL HIGH (ref <117)

## 2013-11-12 MED ORDER — ACETAMINOPHEN 500 MG PO TABS: 1000.0000 mg | ORAL_TABLET | Freq: Three times a day (TID) | ORAL | Status: DC | PRN

## 2013-11-12 MED ORDER — OXYCODONE HCL 5 MG PO TABS: 5.0000 mg | ORAL_TABLET | ORAL | Status: DC | PRN

## 2013-11-12 MED ORDER — FE FUMARATE-B12-VIT C-FA-IFC PO CAPS: 1.0000 | ORAL_CAPSULE | Freq: Three times a day (TID) | ORAL | Status: DC

## 2013-11-12 NOTE — Progress Notes (Signed)
Trauma Service Note  Subjective: Patient oriented x 3.  No acute distress  Objective: Vital signs in last 24 hours: Temp:  [97.8 F (36.6 C)-98.2 F (36.8 C)] 98.2 F (36.8 C) (03/07 0744) Pulse Rate:  [94-116] 94 (03/07 0336) Resp:  [12-28] 19 (03/07 0747) BP: (132-171)/(70-111) 139/89 mmHg (03/07 0747) SpO2:  [96 %-100 %] 100 % (03/07 0747) FiO2 (%):  [0 %] 0 % (03/07 0428)    Intake/Output from previous day: 03/06 0701 - 03/07 0700 In: 746.4 [P.O.:480; I.V.:16.4; IV Piggyback:250] Out: 1750 [Urine:1750] Intake/Output this shift: Total I/O In: 360 [P.O.:360] Out: -   General: No distress  Lungs: Clear.  CXR shows a small left effusion  Abd: Benign  Extremities: No changes.  Neuro: Intact  Lab Results: CBC   Recent Labs  11/10/13 0410 11/11/13 0446  WBC 15.1* 10.8*  HGB 9.1* 8.6*  HCT 26.4* 24.9*  PLT 175 176   BMET  Recent Labs  11/11/13 0446 11/12/13 0430  NA 138 141  K 3.4* 3.7  CL 101 106  CO2 25 26  GLUCOSE 196* 243*  BUN 6 14  CREATININE 0.71 0.82  CALCIUM 7.9* 7.8*   PT/INR No results found for this basename: LABPROT, INR,  in the last 72 hours ABG No results found for this basename: PHART, PCO2, PO2, HCO3,  in the last 72 hours  Studies/Results: Dg Chest 2 View  11/12/2013   CLINICAL DATA:  Left VATS  EXAM: CHEST  2 VIEW  COMPARISON:  11/11/2013  FINDINGS: There is a right IJ catheter with tip in the projection of the SVC. Postoperative changes in volume loss are noted within the left lung base. There has been removal of the left chest tube. A tiny left apical pneumothorax appears decreased from previous exam. There is a small amount of a pneumomediastinum which also appears unchanged from previous exam.  IMPRESSION: 1. Stable postoperative changes involving the left hemi thorax. 2. Removal of left chest tube without significant increase in size of left-sided pneumothorax.   Electronically Signed   By: Kerby Moors M.D.   On: 11/12/2013  08:21   Dg Chest 2 View  11/11/2013   CLINICAL DATA:  Status post VATS  EXAM: CHEST  2 VIEW  COMPARISON:  11/10/2013  FINDINGS: One of the 2 left chest tubes seen on the previous study has been removed in the interval. There is a tiny left pneumothorax visible on the current study. Right IJ central line tip overlies the proximal SVC level. Lung volumes are low with diffuse interstitial coarsening and basilar atelectasis bilaterally. Telemetry leads overlie the chest.  IMPRESSION: Interval removal of 1 of the 2 left-sided chest tubes with tiny left-sided pneumothorax.   Electronically Signed   By: Misty Stanley M.D.   On: 11/11/2013 08:20    Anti-infectives: Anti-infectives   Start     Dose/Rate Route Frequency Ordered Stop   11/10/13 1000  cefUROXime (ZINACEF) 1.5 g in dextrose 5 % 50 mL IVPB     1.5 g 100 mL/hr over 30 Minutes Intravenous Every 12 hours 11/10/13 0742 11/11/13 2237   11/08/13 2200  cefUROXime (ZINACEF) 1.5 g in dextrose 5 % 50 mL IVPB  Status:  Discontinued     1.5 g 100 mL/hr over 30 Minutes Intravenous Every 12 hours 11/08/13 1326 11/10/13 0724   11/08/13 0915  ceFAZolin (ANCEF) IVPB 2 g/50 mL premix     2 g 100 mL/hr over 30 Minutes Intravenous  Once 11/08/13 0904 11/08/13  1030      Assessment/Plan: s/p Procedure(s): LEFT THORACOTOMY Discharge  LOS: 4 days   Kathryne Eriksson. Dahlia Bailiff, MD, FACS [phone] Trauma Surgeon 11/12/2013

## 2013-11-12 NOTE — Progress Notes (Signed)
Pt became agitated and pulled off heart monitor and disconnected IV, and stated that he was not reconnected the IV. The pt was educated that the IV was giving him his pain medication. He was ok with only taking oral pain medication. MD paged. Pt was informed that he had to wear the heart monitor and agreed. Pt placed his monitor back on. Will continue to monitor.

## 2013-11-12 NOTE — Progress Notes (Signed)
Pt d/c home per MD order, pt VSS, pt and family verbalized understanding of d/c instructions, all questions answered

## 2013-11-12 NOTE — Progress Notes (Signed)
       [address]       [place] [address]             [phone]          4 Days Post-Op Procedure(s) (LRB): LEFT THORACOTOMY (Left)  Subjective: No complaints.   Objective: Vital signs in last 24 hours: Patient Vitals for the past 24 hrs:  BP Temp Temp src Pulse Resp SpO2  11/12/13 0747 139/89 mmHg - - - 19 100 %  11/12/13 0744 - 98.2 F (36.8 C) Oral - - -  11/12/13 0428 - - - - 12 100 %  11/12/13 0336 132/70 mmHg 98.1 F (36.7 C) Oral 94 14 98 %  11/12/13 0000 - - - - 15 98 %  11/11/13 2321 145/92 mmHg 98.2 F (36.8 C) Oral 96 13 100 %  11/11/13 2207 140/77 mmHg - - 100 - -  11/11/13 2000 - - - - 23 100 %  11/11/13 1942 145/85 mmHg 97.8 F (36.6 C) Oral 110 21 96 %  11/11/13 1250 150/102 mmHg - - - 28 -  11/11/13 1053 171/111 mmHg - - 116 - -  11/11/13 1035 - - - - - 97 %   Current Weight  11/09/13 284 lb 9.8 oz (129.1 kg)     Intake/Output from previous day: 03/06 0701 - 03/07 0700 In: 746.4 [P.O.:480; I.V.:16.4; IV Piggyback:250] Out: 1750 [Urine:1750]    PHYSICAL EXAM:  Heart: RRR Lungs: Clear Wound: Clean and dry    Lab Results: CBC: Recent Labs  11/10/13 0410 11/11/13 0446  WBC 15.1* 10.8*  HGB 9.1* 8.6*  HCT 26.4* 24.9*  PLT 175 176   BMET:  Recent Labs  11/11/13 0446 11/12/13 0430  NA 138 141  K 3.4* 3.7  CL 101 106  CO2 25 26  GLUCOSE 196* 243*  BUN 6 14  CREATININE 0.71 0.82  CALCIUM 7.9* 7.8*    PT/INR: No results found for this basename: LABPROT, INR,  in the last 72 hours  CXR: FINDINGS:  There is a right IJ catheter with tip in the projection of the SVC.  Postoperative changes in volume loss are noted within the left lung  base. There has been removal of the left chest tube. A tiny left  apical pneumothorax appears decreased from previous exam. There is a  small amount of a pneumomediastinum which also appears unchanged  from previous exam.  IMPRESSION:  1. Stable postoperative changes  involving the left hemi thorax.  2. Removal of left chest tube without significant increase in size  of left-sided pneumothorax.    Assessment/Plan: S/P Procedure(s) (LRB): LEFT THORACOTOMY (Left) Stable from thoracic standpoint. For discharge today per trauma.  Will arrange outpatient followup.   LOS: 4 days    Jeremy [name] 11/12/2013

## 2013-11-12 NOTE — Discharge Instructions (Signed)
Thoracotomy, Care After ° These instructions give you information on caring for yourself after your procedure. Your doctor may also give you more specific instructions. Call your doctor if you have any problems or questions after your procedure. °HOME CARE °· Only take medicine as told by your doctor. Take pain medicine before your pain gets severe. °· Take deep breaths to protect yourself against a lung infection (pneumonia). °· Cough often to clear thick spit (mucus) and to open your lungs. Hold a pillow against your chest if it hurts to cough. °· Keep using your breathing machine (incentive spirometer) as told. °· Change bandages (dressings) as needed, or as told by your doctor. °· Take off the bandage over your chest tube area as told by your doctor. °· Continue your normal diet as told by your doctor. °· Eat high-fiber foods. This includes whole grain cereals, brown rice, beans, and fresh fruits and vegetables. °· Drink enough fluids to keep your pee (urine) clear or pale yellow. Avoid caffeine. °· Talk to your doctor about taking a medicine to soften your poop (stool softener, laxative). °· Continue activities as told by your doctor. Balance your activity with rest. °· Avoid lifting or driving until your doctor approves. °· Take showers until you see your doctor, or as told. Gently wash the area of your surgical cut (incision) with soap and water as told. Do not take baths or sit in a hot tub until your doctor approves. °· Do not smoke or use tobacco. Avoid being around others who smoke. °· Schedule a doctor visit to get your stitches (sutures) or staples removed at told. °· Schedule and go to all doctor visits as told. °· Go to therapy that can help improve your lungs (pulmonary rehabilitation) as told by your doctor. °· Do not travel by airplane for 2 weeks after the chest tube is taken out. °GET HELP IF: °· You are bleeding from your wounds. °· You have redness, puffiness (swelling), or more pain in the  wounds. °· You feel your heart beating fast or skipping beats (irregular heartbeat). °· You have yellowish-white fluid (pus) coming from your wounds. °· You have a bad smell coming from the wound or bandage. °· You have a fever or chills. °· You feel sick to your stomach or you vomit. °· You have muscle aches. °GET HELP RIGHT AWAY IF: °· You have a rash. °· You have trouble breathing. °· Your medicines are causing you problems. °· You keep feeling sick to your stomach (nauseous). °· You feel lightheaded. °· You have shortness of breath or chest pain. °· You have pain that will not go away. °· You are bleeding from your wounds. °· You have redness, puffiness (swelling), or more pain in the wounds. °· You feel your heart beating fast or skipping beats (irregular heartbeat). °· You have yellowish-white fluid (pus) coming from your wounds. °· You have a bad smell coming from the wound or bandage. °· You have a fever or chills. °· You feel sick to your stomach or you vomit. °· You have muscle aches. °MAKE SURE YOU: °· Understand these instructions. °· Will watch your condition. °· Will get help right away if you are not doing well or get worse. °Document Released: 02/24/2012 Document Revised: 06/15/2013 Document Reviewed: 04/13/2013 °[employer]® Patient Information ©2014 [employer]. ° °
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
## 2013-11-15 ENCOUNTER — Encounter (HOSPITAL_COMMUNITY): Payer: Self-pay | Admitting: *Deleted

## 2013-11-15 ENCOUNTER — Telehealth (INDEPENDENT_AMBULATORY_CARE_PROVIDER_SITE_OTHER): Payer: Self-pay

## 2013-11-15 NOTE — Telephone Encounter (Signed)
Returned call. I notified ok to change rx from [facility] to [facility] per [employer]. If any other refills the pt will need to get from his PCP.

## 2013-11-15 NOTE — Telephone Encounter (Signed)
[facility] pharmacy calling b/c a rx that was wrote on 3/7 for Trinsicon is not available anymore but [facility] could replace the rx if we would like to change this rx. I will check with Meagan Dort P.A. And get back in touch with [facility].

## 2013-11-16 ENCOUNTER — Other Ambulatory Visit: Payer: Self-pay | Admitting: *Deleted

## 2013-11-16 DIAGNOSIS — S21112A Laceration without foreign body of left front wall of thorax without penetration into thoracic cavity, initial encounter: Secondary | ICD-10-CM

## 2013-11-18 ENCOUNTER — Ambulatory Visit (INDEPENDENT_AMBULATORY_CARE_PROVIDER_SITE_OTHER): Payer: Self-pay

## 2013-11-18 DIAGNOSIS — G8918 Other acute postprocedural pain: Secondary | ICD-10-CM

## 2013-11-18 DIAGNOSIS — Z4802 Encounter for removal of sutures: Secondary | ICD-10-CM

## 2013-11-18 DIAGNOSIS — S272XXA Traumatic hemopneumothorax, initial encounter: Secondary | ICD-10-CM

## 2013-11-18 MED ORDER — OXYCODONE HCL 5 MG PO TABS: 5.0000 mg | ORAL_TABLET | Freq: Four times a day (QID) | ORAL | Status: DC | PRN

## 2013-11-21 NOTE — Progress Notes (Signed)
Removed 28 staples from Lt Thoracotomy site, the wound edges are non approximated with no redness or drainage. Removed 7 sutures from neck incision, the wound edges are non-approximated with no redness for drainage. Removed 4 sutures from the lateral left axillary incision site the wound is well approximated with no redness or drainage. Removed 3 sutures from chest tube incision sites, 1 incision site had fatty necrosis tissue with a small amount of serosanguinous drainage, no redness or odor. Dr Servando Snare checked the site and recommended cleaning with hydrogen peroxide twice a day and cover with 2 x 2 gauze. The other 2 chest tube incisions appearance showed signs of infection. Patient tolerated well and was given a RX refill on his pain medication.

## 2013-11-23 ENCOUNTER — Ambulatory Visit (INDEPENDENT_AMBULATORY_CARE_PROVIDER_SITE_OTHER): Payer: Self-pay | Admitting: Cardiothoracic Surgery

## 2013-11-23 ENCOUNTER — Encounter: Payer: Self-pay | Admitting: Cardiothoracic Surgery

## 2013-11-23 ENCOUNTER — Ambulatory Visit
Admission: RE | Admit: 2013-11-23 | Discharge: 2013-11-23 | Disposition: A | Discharge status: Home / Self Care | Payer: Self-pay | Source: Ambulatory Visit | Attending: Cardiothoracic Surgery | Admitting: Cardiothoracic Surgery

## 2013-11-23 VITALS — BP 135/93 | HR 98 | Resp 20 | Ht 77.0 in | Wt 284.0 lb

## 2013-11-23 DIAGNOSIS — Z9889 Other specified postprocedural states: Secondary | ICD-10-CM

## 2013-11-23 DIAGNOSIS — S21112A Laceration without foreign body of left front wall of thorax without penetration into thoracic cavity, initial encounter: Secondary | ICD-10-CM

## 2013-11-23 DIAGNOSIS — Z09 Encounter for follow-up examination after completed treatment for conditions other than malignant neoplasm: Secondary | ICD-10-CM

## 2013-11-23 DIAGNOSIS — S21109A Unspecified open wound of unspecified front wall of thorax without penetration into thoracic cavity, initial encounter: Secondary | ICD-10-CM

## 2013-11-23 DIAGNOSIS — S1191XA Laceration without foreign body of unspecified part of neck, initial encounter: Secondary | ICD-10-CM

## 2013-11-23 DIAGNOSIS — S1190XA Unspecified open wound of unspecified part of neck, initial encounter: Secondary | ICD-10-CM
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
## 2013-12-12 ENCOUNTER — Other Ambulatory Visit: Payer: Self-pay | Admitting: *Deleted

## 2013-12-12 DIAGNOSIS — S272XXA Traumatic hemopneumothorax, initial encounter: Secondary | ICD-10-CM

## 2013-12-14 ENCOUNTER — Ambulatory Visit: Payer: Self-pay | Admitting: Cardiothoracic Surgery

## 2014-12-15 ENCOUNTER — Ambulatory Visit: Payer: Self-pay

## 2014-12-31 ENCOUNTER — Emergency Department (HOSPITAL_COMMUNITY)
Admission: EM | Admit: 2014-12-31 | Discharge: 2014-12-31 | Disposition: A | Discharge status: Home / Self Care | Payer: Self-pay | Attending: Emergency Medicine | Admitting: Emergency Medicine

## 2014-12-31 ENCOUNTER — Encounter (HOSPITAL_COMMUNITY): Payer: Self-pay | Admitting: *Deleted

## 2014-12-31 DIAGNOSIS — Z8619 Personal history of other infectious and parasitic diseases: Secondary | ICD-10-CM | POA: Insufficient documentation

## 2014-12-31 DIAGNOSIS — R600 Localized edema: Secondary | ICD-10-CM | POA: Insufficient documentation

## 2014-12-31 DIAGNOSIS — I10 Essential (primary) hypertension: Secondary | ICD-10-CM | POA: Insufficient documentation

## 2014-12-31 DIAGNOSIS — Z720 Tobacco use: Secondary | ICD-10-CM | POA: Insufficient documentation

## 2014-12-31 DIAGNOSIS — R609 Edema, unspecified: Secondary | ICD-10-CM

## 2014-12-31 DIAGNOSIS — Z79899 Other long term (current) drug therapy: Secondary | ICD-10-CM | POA: Insufficient documentation

## 2014-12-31 DIAGNOSIS — J45909 Unspecified asthma, uncomplicated: Secondary | ICD-10-CM | POA: Insufficient documentation

## 2014-12-31 DIAGNOSIS — E119 Type 2 diabetes mellitus without complications: Secondary | ICD-10-CM | POA: Insufficient documentation

## 2014-12-31 LAB — COMPREHENSIVE METABOLIC PANEL
ALBUMIN: 1.4 g/dL — AB (ref 3.5–5.2)
ALK PHOS: 39 U/L (ref 39–117)
ALT: 12 U/L (ref 0–53)
AST: 20 U/L (ref 0–37)
Anion gap: 8 (ref 5–15)
BILIRUBIN TOTAL: 0.2 mg/dL — AB (ref 0.3–1.2)
BUN: 14 mg/dL (ref 6–23)
CHLORIDE: 103 mmol/L (ref 96–112)
CO2: 30 mmol/L (ref 19–32)
CREATININE: 1.08 mg/dL (ref 0.50–1.35)
Calcium: 7.8 mg/dL — ABNORMAL LOW (ref 8.4–10.5)
GFR, EST NON AFRICAN AMERICAN: 88 mL/min — AB (ref >90)
Glucose, Bld: 174 mg/dL — ABNORMAL HIGH (ref 70–99)
Potassium: 3.7 mmol/L (ref 3.5–5.1)
Sodium: 141 mmol/L (ref 135–145)
Total Protein: 4.7 g/dL — ABNORMAL LOW (ref 6.0–8.3)

## 2014-12-31 LAB — CBC WITH DIFFERENTIAL/PLATELET
BASOS ABS: 0 10*3/uL (ref 0.0–0.1)
Basophils Relative: 0 % (ref 0–1)
EOS PCT: 1 % (ref 0–5)
Eosinophils Absolute: 0.1 10*3/uL (ref 0.0–0.7)
HEMATOCRIT: 38 % — AB (ref 39.0–52.0)
Hemoglobin: 12.3 g/dL — ABNORMAL LOW (ref 13.0–17.0)
LYMPHS PCT: 37 % (ref 12–46)
Lymphs Abs: 2.3 10*3/uL (ref 0.7–4.0)
MCH: 26.5 pg (ref 26.0–34.0)
MCHC: 32.4 g/dL (ref 30.0–36.0)
MCV: 81.7 fL (ref 78.0–100.0)
MONO ABS: 0.3 10*3/uL (ref 0.1–1.0)
MONOS PCT: 5 % (ref 3–12)
NEUTROS ABS: 3.5 10*3/uL (ref 1.7–7.7)
NEUTROS PCT: 57 % (ref 43–77)
Platelets: 283 10*3/uL (ref 150–400)
RBC: 4.65 MIL/uL (ref 4.22–5.81)
RDW: 14.5 % (ref 11.5–15.5)
WBC: 6.1 10*3/uL (ref 4.0–10.5)

## 2014-12-31 LAB — URINALYSIS, ROUTINE W REFLEX MICROSCOPIC
Bilirubin Urine: NEGATIVE
Glucose, UA: 250 mg/dL — AB
KETONES UR: NEGATIVE mg/dL
Leukocytes, UA: NEGATIVE
NITRITE: NEGATIVE
Protein, ur: >300 mg/dL — AB
Specific Gravity, Urine: 1.03 (ref 1.005–1.030)
Urobilinogen, UA: 0.2 mg/dL (ref 0.0–1.0)
pH: 5.5 (ref 5.0–8.0)

## 2014-12-31 LAB — URINE MICROSCOPIC-ADD ON

## 2014-12-31 MED ORDER — HYDROCHLOROTHIAZIDE 25 MG PO TABS: 25.0000 mg | ORAL_TABLET | Freq: Every day | ORAL | Status: DC

## 2014-12-31 MED ORDER — HYDROCHLOROTHIAZIDE 25 MG PO TABS
25.0000 mg | ORAL_TABLET | Freq: Every day | ORAL | Status: DC
  Administered 2014-12-31: 25 mg via ORAL
  Filled 2014-12-31: qty 1

## 2014-12-31 MED ORDER — LISINOPRIL 5 MG PO TABS: 5.0000 mg | ORAL_TABLET | Freq: Every day | ORAL | Status: DC

## 2014-12-31 MED ORDER — GLIPIZIDE 5 MG PO TABS: 5.0000 mg | ORAL_TABLET | Freq: Every day | ORAL | Status: DC

## 2014-12-31 MED ORDER — PRAVASTATIN SODIUM 20 MG PO TABS: 20.0000 mg | ORAL_TABLET | Freq: Every day | ORAL | Status: DC

## 2014-12-31 MED ORDER — METFORMIN HCL 1000 MG PO TABS: 1000.0000 mg | ORAL_TABLET | Freq: Two times a day (BID) | ORAL | Status: DC

## 2014-12-31 NOTE — ED Provider Notes (Signed)
CSN: [id number]     Arrival date & time 12/31/14  1345 History   First MD Initiated Contact with Patient 12/31/14 1622     Chief Complaint  Patient presents with  . Leg Swelling   (Consider location/radiation/quality/duration/timing/severity/associated sxs/prior Treatment) HPI  Jeremy [name] is a 34 yo male with PMH of HTN, and diabetes presenting with 3 weeks of bilat feet, ankle and lower leg swelling.  He was a pt of the internal medicine clinic but has not been seen in their clinic.  He normally takes lisinopril but has been out for the last 5 days.  He denies chest pain, shortness of breath, cough, fever, or leg pain, history of VTE or PE, recent trauma or surgery.    Past Medical History  Diagnosis Date  . Abscess of left groin     s/p incision and drainage  . Diabetes mellitus     Uncontrolled A1C 5/12 13  . Diabetes mellitus without complication   . Hypertension   . Asthma    Past Surgical History  Procedure Laterality Date  . Thoracotomy Left 11/08/2013    Procedure: LEFT THORACOTOMY;  Surgeon: Ivin Poot, MD;  Location: [facility];  Service: Thoracic;  Laterality: Left;   History reviewed. No pertinent family history. History  Substance Use Topics  . Smoking status: Current Every Day Smoker    Types: Cigarettes  . Smokeless tobacco: Not on file  . Alcohol Use: Yes     Comment: occasional     Review of Systems  Constitutional: Negative for fever and chills.  HENT: Negative for sore throat.   Eyes: Negative for visual disturbance.  Respiratory: Negative for cough and shortness of breath.   Cardiovascular: Positive for leg swelling. Negative for chest pain.  Gastrointestinal: Negative for nausea, vomiting and diarrhea.  Genitourinary: Negative for dysuria.  Musculoskeletal: Negative for myalgias.  Skin: Negative for rash.  Neurological: Negative for weakness, numbness and headaches.    Allergies  Review of patient's allergies indicates no known  allergies.  Home Medications   Prior to Admission medications   Medication Sig Start Date End Date Taking? Authorizing Provider  acetaminophen (TYLENOL) 500 MG tablet Take 2 tablets (1,000 mg total) by mouth every 8 (eight) hours as needed for headache. 11/12/13   Megan N Dort, PA-C  ferrous Q000111Q C-folic acid (TRINSICON / FOLTRIN) capsule Take 1 capsule by mouth 3 (three) times daily after meals. 11/12/13   Megan N Dort, PA-C  glipiZIDE (GLUCOTROL) 5 MG tablet Take 5 mg by mouth daily.    Historical Provider, MD  lisinopril (PRINIVIL,ZESTRIL) 5 MG tablet Take 5 mg by mouth daily.    Historical Provider, MD  LISINOPRIL PO Take 1 tablet by mouth daily.    Historical Provider, MD  metFORMIN (GLUCOPHAGE) 1000 MG tablet TAKE ONE TABLET BY MOUTH TWICE DAILY WITH MEALS 11/16/12   Jessee Avers, MD  oxyCODONE (OXY IR/ROXICODONE) 5 MG immediate release tablet Take 1-3 tablets (5-15 mg total) by mouth every 6 (six) hours as needed for severe pain (pain). 11/18/13   Grace Isaac, MD  pravastatin (PRAVACHOL) 20 MG tablet Take 20 mg by mouth daily.    Historical Provider, MD   BP 155/96 mmHg  Pulse 76  Temp(Src) 97.8 F (36.6 C) (Oral)  Resp 24  Ht 6\' 5"  (1.956 m)  Wt 325 lb 3.2 oz (147.51 kg)  BMI 38.56 kg/m2  SpO2 100% Physical Exam  Constitutional: He appears well-developed and well-nourished. No distress.  HENT:  Head: Normocephalic and atraumatic.  Mouth/Throat: Oropharynx is clear and moist. No oropharyngeal exudate.  Eyes: Conjunctivae are normal.  Neck: Neck supple. No thyromegaly present.  Cardiovascular: Normal rate, regular rhythm and intact distal pulses.  Exam reveals no gallop and no friction rub.   No murmur heard. 3+ peripheral edema from feet to proximal lower leg, 2+ pedal pulses noted, no redness or wounds   Pulmonary/Chest: Effort normal and breath sounds normal. No respiratory distress. He has no wheezes. He has no rales. He exhibits no tenderness.   Abdominal: Soft. There is no tenderness.  Musculoskeletal: He exhibits no tenderness.  Lymphadenopathy:    He has no cervical adenopathy.  Neurological: He is alert.  Skin: Skin is warm and dry. No rash noted. He is not diaphoretic.  Psychiatric: He has a normal mood and affect.  Nursing note and vitals reviewed.   ED Course  Procedures (including critical care time) Labs Review Labs Reviewed  URINALYSIS, ROUTINE W REFLEX MICROSCOPIC - Abnormal; Notable for the following:    APPearance CLOUDY (*)    Glucose, UA 250 (*)    Hgb urine dipstick LARGE (*)    Protein, ur >300 (*)    All other components within normal limits  CBC WITH DIFFERENTIAL/PLATELET - Abnormal; Notable for the following:    Hemoglobin 12.3 (*)    HCT 38.0 (*)    All other components within normal limits  COMPREHENSIVE METABOLIC PANEL - Abnormal; Notable for the following:    Glucose, Bld 174 (*)    Calcium 7.8 (*)    Total Protein 4.7 (*)    Albumin 1.4 (*)    Total Bilirubin 0.2 (*)    GFR calc non Af Amer 88 (*)    All other components within normal limits  URINE MICROSCOPIC-ADD ON - Abnormal; Notable for the following:    Casts HYALINE CASTS (*)    All other components within normal limits   Imaging Review No results found.   EKG Interpretation  Sinus rhythm Nonspecific T abnormalities, lateral leads Vent. rate 83 BPM PR interval 140 ms QRS duration 86 ms QT/QTc 365/429 ms P-R-T axes 13 0 175  MDM   Final diagnoses:  Peripheral edema   34 yo with progressively worsening bilat leg swelling and non-adherence to his diabetes and bp regimen.  CBC negative for acute abnormality, CMP, shows elevated glucose to 174 but normal BUN and creatinine. UA concerning for initial kidney involvement. Hgb, protein and glucose noted. Discussed case with Dr. Jeneen Rinks. Pt treated with 1st dose of HCTZ in ED.  Refill of prescriptions provided with 10 day course of HCTZ. Pt is well-appearing, in no acute distress and  vital signs reviewed and not concerning. He appears safe to be discharged.  Discharge include resources to establish care with a urologist and  PCP for follow-up .  Return precautions provided. Pt aware of plan and in agreement.    Filed Vitals:   12/31/14 1900 12/31/14 1915 12/31/14 1930 12/31/14 2001  BP: 151/101 139/72 154/93 170/107  Pulse: 79 84 88 78  Temp:    98.9 F (37.2 C)  TempSrc:    Oral  Resp: 8 18  18   Height:      Weight:      SpO2: 99% 100% 98% 100%   Meds given in ED:  Medications - No data to display  Discharge Medication List as of 12/31/2014  7:57 PM    START taking these medications   Details  hydrochlorothiazide (HYDRODIURIL)  25 MG tablet Take 1 tablet (25 mg total) by mouth daily., Starting 12/31/2014, Until Discontinued, Print           Britt Bottom, NP 01/02/15 0022  Tanna Furry, MD 01/13/15 [phone]

## 2014-12-31 NOTE — ED Notes (Signed)
Beth, NP at the bedside.

## 2014-12-31 NOTE — ED Notes (Signed)
Pt reports swelling to bilateral legs for weeks, denies pain or sob. Recently ran out of his bp meds. No acute distress noted at triage.

## 2014-12-31 NOTE — ED Notes (Signed)
NP Beth at the bedside.

## 2014-12-31 NOTE — Discharge Instructions (Signed)
Please follow directions provided. Be sure to use the referral or the resource guide to establish care with a primary care doctor.  Be sure to call and make an appointment with a kidney doctor.  Take your prescribed medicines as directed. Take the HCTZ to help with extra fluid. Don't hesitate to return for any new, worsening, or concerning symptoms.  SEEK IMMEDIATE MEDICAL CARE IF:  You have increased swelling, pain, redness, or heat in your legs.  You develop shortness of breath, especially when lying down.  You develop chest or abdominal pain, weakness, or fainting.  You have a fever.  SEEK IMMEDIATE MEDICAL CARE IF:  You have unusual dizziness or weakness.  You have excessive sleepiness.  You have a seizure or convulsion.  You have severe, painful muscle spasms.  You have shortness of breath or trouble breathing.  You pass out or have a fainting episode.  You have chest pains.    Emergency Department Resource Guide 1) Find a Doctor and Pay Out of Pocket Although you won't have to find out who is covered by your insurance plan, it is a good idea to ask around and get recommendations. You will then need to call the office and see if the doctor you have chosen will accept you as a new patient and what types of options they offer for patients who are self-pay. Some doctors offer discounts or will set up payment plans for their patients who do not have insurance, but you will need to ask so you aren't surprised when you get to your appointment.  2) Contact Your Local Health Department Not all health departments have doctors that can see patients for sick visits, but many do, so it is worth a call to see if yours does. If you don't know where your local health department is, you can check in your phone book. The CDC also has a tool to help you locate your state's health department, and many state websites also have listings of all of their local health departments.  3) Find a [facility] If your illness is not likely to be very severe or complicated, you may want to try a walk in clinic. These are popping up all over the country in pharmacies, drugstores, and shopping centers. They're usually staffed by nurse practitioners or physician assistants that have been trained to treat common illnesses and complaints. They're usually fairly quick and inexpensive. However, if you have serious medical issues or chronic medical problems, these are probably not your best option.  No Primary Care Doctor: - Call Health Connect at  [phone] - they can help you locate a primary care doctor that  accepts your insurance, provides certain services, etc. - Physician Referral Service- [phone]  Chronic Pain Problems: Organization         Address  Phone   Notes  [facility]  [phone] Patients need to be referred by their primary care doctor.   Medication Assistance: Organization         Address  Phone   Notes  [facility] Medication [facility] [address] [place], [place] [address] [phone] --Must be a resident of [facility] -- Must have NO insurance coverage whatsoever (no Medicaid/ Medicare, etc.) -- The pt. MUST have a primary care doctor that directs their care regularly and follows them in the community   MedAssist  [phone]   [employer]  [phone]    Agencies that provide  inexpensive medical care: Organization         Address  Phone   Notes  [place]  [phone]   [facility]    [phone]   [facility] [place], [place] [address] [phone]   [place] [address], [place] [phone]   Planned Parenthood    [phone]   [facility]    [phone]   [place] and [place] [address], [place] Phone:  [phone], Fax:  [phone] Hours  of Operation:  9 am - 6 pm, M-F.  Also accepts Medicaid/Medicare and self-pay.  [facility] for [place], [address], [place] Phone: [phone], Fax: [phone]. Hours of Operation:  8:30 am - 5:30 pm, M-F.  Also accepts Medicaid and self-pay.  [facility] [place] [address], [place] Phone: [phone]   [place], [place], [place] [phone], Ext. [phone] Mondays & Thursdays: 7-9 AM.  First 15 patients are seen on a first come, first serve basis.    [place] Providers:  Organization         Address  Phone   Notes  [facility] [address], [place] [phone] Also accepts self-pay patients.  [facility] [id number] [place], [place]  [phone]   [address], [place] [phone]   [facility] [address] [place] [phone]   Lucianne Lei [address], [place]   [phone] Only accepts [place] Access [place] patients after they have their name applied to their card.   Self-Pay (no insurance) in [facility]:  Organization         Address  Phone   Notes  Sickle Cell Patients, [facility] Internal Medicine [place] [phone]   [facility] [place] [phone]   [facility] [place]  [place], [place], [place] [phone]   [facility]/Dr. Osei-Bonsu  [address], [place] or [address], [place] [phone] Phone number for both [place] and [place] locations is the same.  [facility] and [facility] [address] [place] [phone]   [facility] [address] [place] or [address] [phone]   [facility] [address] [place] [phone], phone; [phone], fax Sees patients 1st and 3rd Saturday of every month.  Must not qualify for public or private insurance (i.e. Medicaid, Medicare, [place] Health Choice, Veterans' Benefits)  Household income should be no more than 200% of the poverty level The clinic cannot treat you if you are pregnant or think you are pregnant  Sexually transmitted diseases are not treated at the clinic.    Dental Care: Organization         Address  Phone  Notes  [facility] Department of [facility] [place] [phone] Accepts children up to age 60 who are enrolled in [place] or [place]; pregnant women with a Medicaid card; and children who have applied for Medicaid or [place] Health Choice,  but were declined, whose parents can pay a reduced fee at time of service.  [facility] Department of [facility]  [address], [place] [phone] Accepts children up to age 77 who are enrolled in [place] or [place]; pregnant women with a Medicaid card; and children who have applied for Medicaid or [place] Health Choice, but were declined, whose parents can pay a reduced fee at time of service.  [place] Adult [facility] Access PROGRAM  [place] [phone] Patients are seen by appointment only. Walk-ins are not accepted. [place] will see patients 62 years of age and older. Monday - Tuesday (8am-5pm) Most Wednesdays (8:30-5pm) $30 per visit, cash only  [facility] Adult Dental Access PROGRAM  [address], [facility] [phone] Patients are seen by appointment only. Walk-ins are not accepted. [place] will see patients 27 years of age and older. One Wednesday Evening (Monthly: Volunteer Based).  $30 per visit, cash only  [place]  [phone] for adults; Children under age 22, call Graduate  Pediatric Dentistry at [phone]. Children aged 4-14, please call [phone] to request a pediatric application.  Dental services are provided in all areas of dental care including fillings, crowns and bridges, complete and partial dentures, implants, gum treatment, root canals, and extractions. Preventive care is also provided. Treatment is provided to both adults and children. Patients are selected via a lottery and there is often a waiting list.   [facility] [address] [place]  [phone] [email or web address]   [facility] [address] [place], [place] [phone], Ext. [phone] Second and Fourth Thursday of each month, opens at 6:30 AM; Clinic ends at 9 AM.  Patients are seen on a first-come first-served basis, and a limited number are seen during each clinic.   [facility]  [address] Hillard Danker [place], [place] [phone]   Eligibility Requirements You must have lived in [place], [place], or [place] counties for at least the last three months.   You cannot be eligible for state or federal sponsored [employer], including [employer], [place], or [employer].   You generally cannot be eligible for healthcare insurance through your employer.    How to apply: Eligibility screenings are held every Tuesday and Wednesday afternoon from 1:00 pm until 4:00 pm. You do not need an appointment for the interview!  [facility] [address], [place], [place]   [place]  [place] Department  [place]  [phone]    Behavioral Health Resources in the Community: Intensive Outpatient Programs Organization         Address  Phone  Notes  [place] [facility] [address] [place], [place] [phone]   [facility] Outpatient [address] [place], [place]   ADS: Alcohol & Drug Svcs [address], [place], [place]   [place] [address],  [place], [place] or [phone]   Substance Abuse Resources Organization         Address  Phone  Notes  Alcohol and Drug Services  [phone]   [facility]  [phone]   [facility] [place]  [phone]   [place]inita Pester  [phone]   Residential & Outpatient Substance Abuse Program  [phone]   Psychological  Special educational needs teacher         Address  Phone  Notes  [facility]  [place]  [phone]   [place] [name] [place] or [phone]    Mobile Crisis Teams Organization         Address  Phone  Notes  [facility], Mobile Crisis Care Unit  [phone]   [facility]  [address] [place], [place]   [place] Levels [address], [place] [phone]    Self-Help/Support Groups Organization         Address  Phone             Notes  [place]. of [place] - variety of support groups  [place] Call for more information  Narcotics Anonymous (NA), [facility] [address], [employer] [place]  2 meetings at this location   Special educational needs teacher         Address  Phone  Notes  [facility]AP Residential Treatment [place],    [place]  [phone]   [facility]  [address], [place] [id number], [place], [place]   [place], [place] [phone] Admissions: 8am-3pm M-F  Incentives Substance [place] [address],    [place], [place] [phone]   [facility] [address] [place], [place], [place]   [facility] [address]  [place], [place]   [facility] Programs - Intensive Outpatient [address] Kristeen [address], [place], [place]   [facility] ([place].,  [place], [place] [phone] or [phone]   [facility] (RTS) [address], [place], [place] Accepts Medicaid  [place] [address],  [place] [phone] Substance Abuse/Addiction Treatment   [facility] Organization         Address  Phone  Notes  [facility]  [phone]   Domenic Schwab, PhD [address] [place]lis [place], [place]   [phone] or [phone]   [place], [place] [phone]   [facility] [address] [place], [place] [phone] Insurance/Medicaid/sponsorship through [facility] and Families [address], [address] [place]                                    [place], [place] [phone] [place] [address], [place] [phone]    Dr. Adele Schilder  [phone]   [facility] Dept. 1) [address], [place] 2) [place] 3)  [place] [address], [place] [phone]  [phone]   [place] [phone] or [phone] (After Hours)

## 2014-12-31 NOTE — ED Notes (Signed)
Phlebotomy at the bedside  

## 2015-01-05 ENCOUNTER — Ambulatory Visit: Payer: Self-pay

## 2015-01-09 ENCOUNTER — Encounter: Payer: Self-pay | Admitting: Pulmonary Disease

## 2015-01-09 ENCOUNTER — Ambulatory Visit (INDEPENDENT_AMBULATORY_CARE_PROVIDER_SITE_OTHER): Payer: Self-pay | Admitting: Pulmonary Disease

## 2015-01-09 VITALS — BP 135/95 | HR 94 | Temp 98.5°F | Ht 77.0 in | Wt 300.4 lb

## 2015-01-09 DIAGNOSIS — R002 Palpitations: Secondary | ICD-10-CM

## 2015-01-09 DIAGNOSIS — R600 Localized edema: Secondary | ICD-10-CM

## 2015-01-09 DIAGNOSIS — D62 Acute posthemorrhagic anemia: Secondary | ICD-10-CM

## 2015-01-09 DIAGNOSIS — E119 Type 2 diabetes mellitus without complications: Secondary | ICD-10-CM

## 2015-01-09 DIAGNOSIS — L84 Corns and callosities: Secondary | ICD-10-CM

## 2015-01-09 DIAGNOSIS — R609 Edema, unspecified: Secondary | ICD-10-CM

## 2015-01-09 DIAGNOSIS — I10 Essential (primary) hypertension: Secondary | ICD-10-CM

## 2015-01-09 DIAGNOSIS — R61 Generalized hyperhidrosis: Secondary | ICD-10-CM

## 2015-01-09 DIAGNOSIS — E785 Hyperlipidemia, unspecified: Secondary | ICD-10-CM

## 2015-01-09 DIAGNOSIS — R079 Chest pain, unspecified: Secondary | ICD-10-CM | POA: Insufficient documentation

## 2015-01-09 LAB — IRON AND TIBC
%SAT: 54 % (ref 20–55)
Iron: 103 ug/dL (ref 42–165)
TIBC: 192 ug/dL — ABNORMAL LOW (ref 215–435)
UIBC: 89 ug/dL — AB (ref 125–400)

## 2015-01-09 LAB — LIPID PANEL
Cholesterol: 422 mg/dL — ABNORMAL HIGH (ref 0–200)
HDL: 45 mg/dL (ref >=40)
LDL Cholesterol: 321 mg/dL — ABNORMAL HIGH (ref 0–99)
Total CHOL/HDL Ratio: 9.4 Ratio
Triglycerides: 281 mg/dL — ABNORMAL HIGH (ref <150)
VLDL: 56 mg/dL — ABNORMAL HIGH (ref 0–40)

## 2015-01-09 LAB — POCT GLYCOSYLATED HEMOGLOBIN (HGB A1C): Hemoglobin A1C: 8.9

## 2015-01-09 LAB — VITAMIN B12: VITAMIN B 12: 420 pg/mL (ref 211–911)

## 2015-01-09 LAB — FERRITIN: Ferritin: 80 ng/mL (ref 22–322)

## 2015-01-09 LAB — TSH: TSH: 2.556 u[IU]/mL (ref 0.350–4.500)

## 2015-01-09 LAB — LACTATE DEHYDROGENASE: LDH: 175 U/L (ref 94–250)

## 2015-01-09 LAB — GLUCOSE, CAPILLARY: GLUCOSE-CAPILLARY: 92 mg/dL (ref 70–99)

## 2015-01-09 MED ORDER — FUROSEMIDE 20 MG PO TABS: 20.0000 mg | ORAL_TABLET | Freq: Every day | ORAL | Status: DC

## 2015-01-09 NOTE — Progress Notes (Signed)
Subjective:   Patient ID: Jeremy [name], male    DOB: 05-Feb-1981, 34 y.o.   MRN: [id number]  HPI [name] is a 34 year old man with history of HTN, asthma, DM2, HLD presenting with peripheral edema x 1 month.  He was seen in the ED 12/31/2014 for the bilateral LE edema. EKG with T wave inversions in lateral leads. Albumin 1.4. UA with >300 protein. He was started on HCTZ 25mg  daily. He also had his other medications refilled - he had not been on his medications for a while and was only taking metformin.  Today he reports minimal improvement in his BLE edema. He has never had this swelling before. 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 He has also noted palpitations intermittently. He has chest pain with the palpitations. The pain is located midsternum without radiation. No exacerbating or relieving factors. Not associated with exertion. Occasional associated diaphoresis. Denies LH/dizziness, SOB, nausea. Also notes fatigue.   Review of Systems Constitutional: no fevers/chills Eyes: no vision changes Ears, nose, mouth, throat, and face: no cough Respiratory: no shortness of breath Cardiovascular: +chest pain Gastrointestinal: no nausea/vomiting, no abdominal pain, no constipation, no diarrhea Genitourinary: no dysuria, no hematuria Integument: no rash Hematologic/lymphatic: no bleeding/bruising, +edema Musculoskeletal: no arthralgias, no myalgias Neurological: no paresthesias, no weakness  Past Medical History  Diagnosis Date  . Abscess of left groin     s/p incision and drainage  . Diabetes mellitus     Uncontrolled A1C 5/12 13  . Diabetes mellitus without complication   . Hypertension   . Asthma    Current Outpatient Prescriptions on File Prior to Visit  Medication Sig Dispense Refill  . acetaminophen (TYLENOL) 500 MG tablet Take 2 tablets (1,000 mg total) by mouth every 8 (eight) hours as needed for headache. 30 tablet 0  . ferrous [id number] C-folic  acid (TRINSICON / FOLTRIN) capsule Take 1 capsule by mouth 3 (three) times daily after meals. (Patient not taking: Reported on 12/31/2014)    . glipiZIDE (GLUCOTROL) 5 MG tablet Take 1 tablet (5 mg total) by mouth daily. 30 tablet 0  . lisinopril (PRINIVIL,ZESTRIL) 5 MG tablet Take 1 tablet (5 mg total) by mouth daily. 30 tablet 0  . metFORMIN (GLUCOPHAGE) 1000 MG tablet Take 1 tablet (1,000 mg total) by mouth 2 (two) times daily with a meal. 30 tablet 0  . oxyCODONE (OXY IR/ROXICODONE) 5 MG immediate release tablet Take 1-3 tablets (5-15 mg total) by mouth every 6 (six) hours as needed for severe pain (pain). (Patient not taking: Reported on 12/31/2014) 40 tablet 0  . pravastatin (PRAVACHOL) 20 MG tablet Take 1 tablet (20 mg total) by mouth daily. 30 tablet 0   No current facility-administered medications on file prior to visit.    Today's Vitals   01/09/15 1527  BP: 135/95  Pulse: 94  Temp: 98.5 F (36.9 C)  TempSrc: Oral  Height: 6\' 5"  (1.956 m)  Weight: 300 lb 6.4 oz (136.261 kg)  SpO2: 100%   Objective:  Physical Exam  Constitutional: He is oriented to person, place, and time. He appears well-developed and well-nourished. No distress.  HENT:  Head: Normocephalic and atraumatic.  Mouth/Throat: Oropharynx is clear and moist.  Eyes: Conjunctivae and EOM are normal.  Neck: Neck supple.  Cardiovascular: Normal rate and regular rhythm.  Exam reveals no gallop and no friction rub.   No murmur heard. Pulmonary/Chest: Effort normal and breath sounds normal. He has no wheezes. He has no rales.  Abdominal: Soft. He exhibits no distension.  Musculoskeletal: He exhibits edema (2+ BLE pitting). He exhibits no tenderness.  Neurological: He is alert and oriented to person, place, and time.  Skin: Skin is warm and dry. He is not diaphoretic.  Psychiatric: He has a normal mood and affect.   Assessment & Plan:  Please refer to problem based charting.

## 2015-01-09 NOTE — Patient Instructions (Addendum)
Please take Lasix 20mg  daily. Please stop taking hydrochlorothiazide.  General Instructions:   Please bring your medicines with you each time you come to clinic.  Medicines may include prescription medications, over-the-counter medications, herbal remedies, eye drops, vitamins, or other pills.   Progress Toward Treatment Goals:  Treatment Goal 01/09/2015  Blood pressure improved

## 2015-01-10 ENCOUNTER — Other Ambulatory Visit (HOSPITAL_COMMUNITY): Payer: Self-pay | Admitting: Urology

## 2015-01-10 DIAGNOSIS — R3129 Other microscopic hematuria: Secondary | ICD-10-CM

## 2015-01-10 LAB — RETICULOCYTES
ABS Retic: 36.7 10*3/uL (ref 19.0–186.0)
RBC.: 5.24 MIL/uL (ref 4.22–5.81)
RETIC CT PCT: 0.7 % (ref 0.4–2.3)

## 2015-01-10 LAB — FOLATE RBC
Folate, Hemolysate: 377.8 ng/mL
Folate, RBC: 904 ng/mL (ref >498)
Hematocrit: 41.8 % (ref 37.5–51.0)

## 2015-01-10 LAB — PATHOLOGIST SMEAR REVIEW

## 2015-01-10 MED ORDER — GLIPIZIDE 5 MG PO TABS: 5.0000 mg | ORAL_TABLET | Freq: Every day | ORAL | Status: DC

## 2015-01-10 MED ORDER — ATORVASTATIN CALCIUM 80 MG PO TABS: 80.0000 mg | ORAL_TABLET | Freq: Every day | ORAL | Status: DC

## 2015-01-10 MED ORDER — METFORMIN HCL 1000 MG PO TABS: 1000.0000 mg | ORAL_TABLET | Freq: Two times a day (BID) | ORAL | Status: DC

## 2015-01-10 MED ORDER — LISINOPRIL 5 MG PO TABS: 5.0000 mg | ORAL_TABLET | Freq: Every day | ORAL | Status: DC

## 2015-01-10 NOTE — Assessment & Plan Note (Addendum)
Assessment: Recently restarted on pravastatin 20mg  daily. Total cholesterol 422, triglycerides 281, LDL 321, HDL 45. 10 year cardiovascular risk 15.42%. With diabetes needs to be on at least moderate intensity statin therapy. With LDL greater than 190 and 10 year risk greater than 7.5%, ideally needs to be on high intensity therapy.  Plan:  -Change pravastatin 20mg  daily to atorvastatin 80mg  daily. -Recheck lipid panel in 4-12 weeks  Addendum: No hypothyroidism. Concern for genetic component. Will need to check for xanthomas (LDL receptor deficiency will have xanthomas on extensor tendons like patellar or achilles or type III will have palmar) at follow up and ask about family history. Referral placed for endocrine.

## 2015-01-10 NOTE — Assessment & Plan Note (Addendum)
Assessment: CBC with hgb 12.3 and normal MCV 12/31/2014. Previous Hgb 8.6 11/11/2013 2/2 acute blood loss. Baseline appears to be around 13. Iron panel reveals iron and ferritin normal at 103 and 80 respectively. TIBC low at 192. Vitamin B12 and folate normal. LDH and bilirubin normal.   Plan: -Continue to monitor

## 2015-01-10 NOTE — Assessment & Plan Note (Signed)
BP Readings from Last 3 Encounters:  01/09/15 135/95  12/31/14 170/107  11/23/13 135/93    Lab Results  Component Value Date   NA 141 12/31/2014   K 3.7 12/31/2014   CREATININE 1.08 12/31/2014    Assessment: Blood pressure control: mildly elevated with diastolic 95 above goal of 90. Systolic within goal. Progress toward BP goal:  improved  Plan: Medications:  continue current medications including lisinopril 5mg  daily. Discontinue HCTZ 25mg  daily. Start Lasix 20mg  daily. Other plans:  -Follow up in 2-3 weeks for recheck.

## 2015-01-10 NOTE — Assessment & Plan Note (Signed)
Assessment: Differential includes ischemic heart disease, CHF, renal disease, nephrotic syndrome, liver disease, hypothyroidism. Evaluation for heart disease as noted for chest pain. LFTs wnl. Albumin low. High protein on urinalysis. TSH normal.  Plan: -Lasix 20mg  daily -Referral to cardiology -Patient was referred to urology by ED. Will follow up workup.

## 2015-01-10 NOTE — Assessment & Plan Note (Addendum)
Assessment: Associated with palpitations and occasional diaphoresis. Differential includes ischemic heart disease, arrhythmia, MSK. Pain is not reproducible on palpation making MSK etiology less likely. Wells score 0 and Geneva score 3 (heart rate 75-94) making him low risk for PE. EKG without acute ischemic changes - nonspecific T wave changes present. 10 year cardiovascular risk 15.42%. TSH within normal limits at 2.556.  Plan: -Referral to cardiology placed for further evaluation.

## 2015-01-10 NOTE — Assessment & Plan Note (Addendum)
Lab Results  Component Value Date   HGBA1C 8.9 01/09/2015   HGBA1C 9.7* 11/12/2013   HGBA1C 8.0 01/14/2012     Assessment: Diabetes control: fair control Progress toward A1C goal:  improved. Has been only on metformin for a while and recently restarted on glipizide.  Plan: Medications:  continue current medications including metformin 1000 BID and glipizide 5mg  daily -Reevaluate in 3 months -Will need eye exam at next appointment -Foot exam performed today. L foot callus and R toe redness noted. Referral to podiatry placed.

## 2015-01-11 ENCOUNTER — Ambulatory Visit (HOSPITAL_COMMUNITY)
Admission: RE | Admit: 2015-01-11 | Discharge: 2015-01-11 | Disposition: A | Discharge status: Home / Self Care | Payer: No Typology Code available for payment source | Source: Ambulatory Visit | Attending: Urology | Admitting: Urology

## 2015-01-11 DIAGNOSIS — I10 Essential (primary) hypertension: Secondary | ICD-10-CM | POA: Insufficient documentation

## 2015-01-11 DIAGNOSIS — R3129 Other microscopic hematuria: Secondary | ICD-10-CM

## 2015-01-11 DIAGNOSIS — E119 Type 2 diabetes mellitus without complications: Secondary | ICD-10-CM | POA: Insufficient documentation

## 2015-01-11 DIAGNOSIS — N281 Cyst of kidney, acquired: Secondary | ICD-10-CM | POA: Insufficient documentation

## 2015-01-11 DIAGNOSIS — R312 Other microscopic hematuria: Secondary | ICD-10-CM | POA: Insufficient documentation

## 2015-01-12 NOTE — Progress Notes (Signed)
INTERNAL MEDICINE TEACHING ATTENDING ADDENDUM - Jona Erkkila, MD: I reviewed and discussed at the time of visit with the resident Dr. Krall, the patient's medical history, physical examination, diagnosis and results of pertinent tests and treatment and I agree with the patient's care as documented.  

## 2015-01-14 NOTE — Progress Notes (Signed)
Cardiology Office Note   Date:  01/14/2015   ID:  Jeremy [name], DOB Mar 09, 1981, MRN [id number]  PCP:  No primary care provider on file.  Cardiologist:   Jenkins Rouge, MD   No chief complaint on file.     History of Present Illness: Jeremy [name] is a 34 y.o. male who presents for evaluation of HTN, Edema, chest pain and palpitations .  He was seen in the ED 12/31/2014 for the bilateral LE edema. EKG with T wave inversions in lateral leads. Albumin 1.4. UA with >300 protein. He was started on HCTZ 25mg  daily. He also had his other medications refilled - he had not been on his medications for a while and was only taking metformin.  May 2016 changed to lasix by IM . 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 He has also noted palpitations intermittently. He has chest pain with the palpitations. The pain is located midsternum without radiation. No exacerbating or relieving factors. Not associated with exertion. Occasional associated diaphoresis. Denies LH/dizziness, SOB, nausea. Also notes fatigue. Sister has DM and CHF with CAD  She is only 12.    Now compliant with meds  Denies excess ETOH/Drugs      Past Medical History  Diagnosis Date  . Abscess of left groin     s/p incision and drainage  . Diabetes mellitus     Uncontrolled A1C 5/12 13  . Diabetes mellitus without complication   . Hypertension   . Asthma     Past Surgical History  Procedure Laterality Date  . Thoracotomy Left 11/08/2013    Procedure: LEFT THORACOTOMY;  Surgeon: Ivin Poot, MD;  Location: [facility] OR;  Service: Thoracic;  Laterality: Left;     Current Outpatient Prescriptions  Medication Sig Dispense Refill  . acetaminophen (TYLENOL) 500 MG tablet Take 2 tablets (1,000 mg total) by mouth every 8 (eight) hours as needed for headache. 30 tablet 0  . atorvastatin (LIPITOR) 80 MG tablet Take 1 tablet (80 mg total) by mouth daily. 90 tablet 2  . furosemide (LASIX) 20 MG tablet Take 1 tablet (20 mg total)  by mouth daily. 30 tablet 1  . glipiZIDE (GLUCOTROL) 5 MG tablet Take 1 tablet (5 mg total) by mouth daily. 30 tablet 3  . lisinopril (PRINIVIL,ZESTRIL) 5 MG tablet Take 1 tablet (5 mg total) by mouth daily. 30 tablet 3  . metFORMIN (GLUCOPHAGE) 1000 MG tablet Take 1 tablet (1,000 mg total) by mouth 2 (two) times daily with a meal. 30 tablet 2   No current facility-administered medications for this visit.    Allergies:   Review of patient's allergies indicates no known allergies.    Social History:  The patient  reports that he has been smoking Cigarettes.  He does not have any smokeless tobacco history on file. He reports that he drinks alcohol. He reports that he uses illicit drugs (Marijuana).   Family History:  The patient's family history is not on file.    ROS:  Please see the history of present illness.   Otherwise, review of systems are positive for none.   All other systems are reviewed and negative.    PHYSICAL EXAM: VS:  There were no vitals taken for this visit. , BMI There is no weight on file to calculate BMI. Affect appropriate Overweight black male  HEENT: normal Neck supple with no adenopathy JVP normal no bruits no thyromegaly Lungs clear with no wheezing and good diaphragmatic motion Heart:  S1/S2 no murmur, no rub, gallop or click PMI normal Abdomen: benighn, BS positve, no tenderness, no AAA no bruit.  No HSM or HJR Distal pulses intact with no bruits Plus 2 LE  edema Neuro non-focal Skin warm and dry multiple tatoos No muscular weakness    EKG:   12/31/14  SR nonspecific ST/T wave changes  01/15/15  SR rate 82  Anterolateral T wave changes    Recent Labs: 12/31/2014: ALT 12; BUN 14; Creatinine 1.08; Hemoglobin 12.3*; Platelets 283; Potassium 3.7; Sodium 141 01/09/2015: TSH 2.556    Lipid Panel    Component Value Date/Time   CHOL 422* 01/09/2015 1634   TRIG 281* 01/09/2015 1634   HDL 45 01/09/2015 1634   CHOLHDL 9.4 01/09/2015 1634   VLDL 56*  01/09/2015 1634   LDLCALC 321* 01/09/2015 1634      Wt Readings from Last 3 Encounters:  01/09/15 300 lb 6.4 oz (136.261 kg)  12/31/14 325 lb 3.2 oz (147.51 kg)  11/23/13 284 lb (128.822 kg)      Other studies Reviewed: Additional studies/ records that were reviewed today include:  Epic records.    ASSESSMENT AND PLAN:  1.  Chest Pain: with abnormal ECG  F/U stress myovue 2. Dyspnea:  Etiology not clear  F/u echo 3. Palpitaitons;  Persistent r/o structural heart disease see above  Event monitor since he gets them daily 4. Edema:  ? Nephrotic syndrome  Has MRI kidney scheduled f/u primary consider increasing lasix 5. HTN:  Increase lisinopril to 10 mg     Current medicines are reviewed at length with the patient today.  The patient does not have concerns regarding medicines.  The following changes have been made:  Lisinopril increase 5-> 10 mg  Labs/ tests ordered today include:  Stress myovue, Echo event monitor   No orders of the defined types were placed in this encounter.     Disposition:   FU with me post test next available      Signed, Jenkins Rouge, MD  01/14/2015 9:08 PM    [facility] [place], [place], [place]  [address] Phone: [phone]; Fax: [phone]

## 2015-01-15 ENCOUNTER — Ambulatory Visit (INDEPENDENT_AMBULATORY_CARE_PROVIDER_SITE_OTHER): Payer: No Typology Code available for payment source | Admitting: Cardiovascular Disease

## 2015-01-15 ENCOUNTER — Encounter: Payer: Self-pay | Admitting: Cardiovascular Disease

## 2015-01-15 VITALS — BP 142/110 | HR 82 | Ht 77.0 in | Wt 304.8 lb

## 2015-01-15 DIAGNOSIS — R0600 Dyspnea, unspecified: Secondary | ICD-10-CM

## 2015-01-15 DIAGNOSIS — R079 Chest pain, unspecified: Secondary | ICD-10-CM

## 2015-01-15 DIAGNOSIS — I10 Essential (primary) hypertension: Secondary | ICD-10-CM

## 2015-01-15 DIAGNOSIS — R9431 Abnormal electrocardiogram [ECG] [EKG]: Secondary | ICD-10-CM

## 2015-01-15 MED ORDER — LISINOPRIL 10 MG PO TABS: 10.0000 mg | ORAL_TABLET | Freq: Every day | ORAL | Status: DC

## 2015-01-15 NOTE — Patient Instructions (Signed)
Medication Instructions:  INCREASE  PRINIVIL TO 10 MG  EVERY DAY   Labwork: NONE  Testing/Procedures: Your physician has recommended that you wear an event monitor. Event monitors are medical devices that record the heart's electrical activity. Doctors most often [place] these monitors to diagnose arrhythmias. Arrhythmias are problems with the speed or rhythm of the heartbeat. The monitor is a small, portable device. You can wear one while you do your normal daily activities. This is usually used to diagnose what is causing palpitations/syncope (passing out).    Your physician has requested that you have en exercise stress myoview. For further information please visit [email or web address]. Please follow instruction sheet, as given.   Your physician has requested that you have an echocardiogram. Echocardiography is a painless test that uses sound waves to create images of your heart. It provides your doctor with information about the size and shape of your heart and how well your heart's chambers and valves are working. This procedure takes approximately one hour. There are no restrictions for this procedure.   Follow-Up: AFTER  TESTS  DONE WITH DR Johnsie Cancel  Any Other Special Instructions Will Be Listed Below (If Applicable).

## 2015-01-17 NOTE — Addendum Note (Signed)
Addended by: Jacques Earthly T on: 01/17/2015 09:55 AM   Modules accepted: Orders

## 2015-01-24 ENCOUNTER — Other Ambulatory Visit: Payer: Self-pay | Admitting: Cardiovascular Disease

## 2015-01-24 ENCOUNTER — Telehealth (HOSPITAL_COMMUNITY): Payer: Self-pay | Admitting: *Deleted

## 2015-01-24 DIAGNOSIS — R002 Palpitations: Secondary | ICD-10-CM

## 2015-01-24 NOTE — Telephone Encounter (Signed)
Patient given detailed instructions per Myocardial Perfusion Study Information Sheet for up coming appointment. Patient verbalized understanding. Hubbard Robinson, RN

## 2015-01-25 ENCOUNTER — Other Ambulatory Visit: Payer: Self-pay | Admitting: Cardiovascular Disease

## 2015-01-25 ENCOUNTER — Ambulatory Visit (HOSPITAL_BASED_OUTPATIENT_CLINIC_OR_DEPARTMENT_OTHER): Payer: No Typology Code available for payment source

## 2015-01-25 ENCOUNTER — Ambulatory Visit (HOSPITAL_COMMUNITY): Payer: No Typology Code available for payment source | Attending: Cardiology

## 2015-01-25 ENCOUNTER — Encounter: Payer: Self-pay | Admitting: *Deleted

## 2015-01-25 ENCOUNTER — Other Ambulatory Visit: Payer: Self-pay

## 2015-01-25 DIAGNOSIS — E119 Type 2 diabetes mellitus without complications: Secondary | ICD-10-CM | POA: Insufficient documentation

## 2015-01-25 DIAGNOSIS — R9431 Abnormal electrocardiogram [ECG] [EKG]: Secondary | ICD-10-CM | POA: Insufficient documentation

## 2015-01-25 DIAGNOSIS — R0609 Other forms of dyspnea: Secondary | ICD-10-CM | POA: Insufficient documentation

## 2015-01-25 DIAGNOSIS — R079 Chest pain, unspecified: Secondary | ICD-10-CM

## 2015-01-25 DIAGNOSIS — I10 Essential (primary) hypertension: Secondary | ICD-10-CM | POA: Insufficient documentation

## 2015-01-25 DIAGNOSIS — R0600 Dyspnea, unspecified: Secondary | ICD-10-CM

## 2015-01-25 DIAGNOSIS — R9439 Abnormal result of other cardiovascular function study: Secondary | ICD-10-CM | POA: Insufficient documentation

## 2015-01-25 LAB — MYOCARDIAL PERFUSION IMAGING
CHL CUP NUCLEAR SDS: 6
CHL CUP STRESS STAGE 1 SPEED: 0 mph
CHL CUP STRESS STAGE 2 GRADE: 0 %
CHL CUP STRESS STAGE 2 HR: 76 {beats}/min
CHL CUP STRESS STAGE 2 SPEED: 0 mph
CHL CUP STRESS STAGE 4 DBP: 95 mmHg
CHL CUP STRESS STAGE 4 GRADE: 0 %
CHL CUP STRESS STAGE 5 DBP: 107 mmHg
CHL CUP STRESS STAGE 5 GRADE: 0 %
CHL CUP STRESS STAGE 6 SPEED: 0 mph
CSEPPBP: 173 mmHg
CSEPPHR: 99 {beats}/min
Estimated workload: 1 METS
LV dias vol: 274 mL
LV sys vol: 197 mL
Nuc Stress EF: 28 %
Percent of predicted max HR: 53 %
RATE: 0.24
Rest HR: 76 {beats}/min
SRS: 3
SSS: 9
Stage 1 DBP: 111 mmHg
Stage 1 Grade: 0 %
Stage 1 HR: 76 {beats}/min
Stage 1 SBP: 175 mmHg
Stage 3 Grade: 0 %
Stage 3 HR: 96 {beats}/min
Stage 3 Speed: 0 mph
Stage 4 HR: 99 {beats}/min
Stage 4 SBP: 173 mmHg
Stage 4 Speed: 0 mph
Stage 5 HR: 93 {beats}/min
Stage 5 SBP: 178 mmHg
Stage 5 Speed: 0 mph
Stage 6 Grade: 0 %
Stage 6 HR: 88 {beats}/min
TID: 1.08

## 2015-01-25 MED ORDER — TECHNETIUM TC 99M SESTAMIBI GENERIC - CARDIOLITE
33.0000 | Freq: Once | INTRAVENOUS | Status: AC | PRN
  Administered 2015-01-25: 33 via INTRAVENOUS

## 2015-01-25 MED ORDER — TECHNETIUM TC 99M SESTAMIBI GENERIC - CARDIOLITE
11.0000 | Freq: Once | INTRAVENOUS | Status: AC | PRN
  Administered 2015-01-25: 11 via INTRAVENOUS

## 2015-01-25 MED ORDER — REGADENOSON 0.4 MG/5ML IV SOLN
0.4000 mg | Freq: Once | INTRAVENOUS | Status: AC
  Administered 2015-01-25: 0.4 mg via INTRAVENOUS

## 2015-01-26 ENCOUNTER — Encounter: Payer: Self-pay | Admitting: Nurse Practitioner

## 2015-01-26 ENCOUNTER — Ambulatory Visit (INDEPENDENT_AMBULATORY_CARE_PROVIDER_SITE_OTHER): Payer: No Typology Code available for payment source | Admitting: Nurse Practitioner

## 2015-01-26 VITALS — BP 160/100 | HR 67 | Ht 76.0 in | Wt 322.8 lb

## 2015-01-26 DIAGNOSIS — I429 Cardiomyopathy, unspecified: Secondary | ICD-10-CM

## 2015-01-26 DIAGNOSIS — E119 Type 2 diabetes mellitus without complications: Secondary | ICD-10-CM

## 2015-01-26 DIAGNOSIS — I10 Essential (primary) hypertension: Secondary | ICD-10-CM

## 2015-01-26 DIAGNOSIS — R072 Precordial pain: Secondary | ICD-10-CM

## 2015-01-26 LAB — BASIC METABOLIC PANEL
BUN: 11 mg/dL (ref 6–23)
CO2: 30 meq/L (ref 19–32)
Calcium: 8.2 mg/dL — ABNORMAL LOW (ref 8.4–10.5)
Chloride: 107 mEq/L (ref 96–112)
Creatinine, Ser: 1.03 mg/dL (ref 0.40–1.50)
GFR: 105.8 mL/min (ref >60.00)
Glucose, Bld: 146 mg/dL — ABNORMAL HIGH (ref 70–99)
Potassium: 3.8 mEq/L (ref 3.5–5.1)
SODIUM: 142 meq/L (ref 135–145)

## 2015-01-26 LAB — CBC
HEMATOCRIT: 39.3 % (ref 39.0–52.0)
HEMOGLOBIN: 12.9 g/dL — AB (ref 13.0–17.0)
MCHC: 33 g/dL (ref 30.0–36.0)
MCV: 79.6 fl (ref 78.0–100.0)
PLATELETS: 261 10*3/uL (ref 150.0–400.0)
RBC: 4.94 Mil/uL (ref 4.22–5.81)
RDW: 14.6 % (ref 11.5–15.5)
WBC: 8.6 10*3/uL (ref 4.0–10.5)

## 2015-01-26 LAB — APTT: aPTT: 28.7 s (ref 23.4–32.7)

## 2015-01-26 MED ORDER — ASPIRIN EC 81 MG PO TBEC: 81.0000 mg | DELAYED_RELEASE_TABLET | Freq: Every day | ORAL | Status: DC

## 2015-01-26 MED ORDER — CARVEDILOL 3.125 MG PO TABS: 3.1250 mg | ORAL_TABLET | Freq: Two times a day (BID) | ORAL | Status: DC

## 2015-01-26 MED ORDER — NITROGLYCERIN 0.4 MG SL SUBL: 0.4000 mg | SUBLINGUAL_TABLET | SUBLINGUAL | Status: DC | PRN

## 2015-01-26 MED ORDER — FUROSEMIDE 40 MG PO TABS: 40.0000 mg | ORAL_TABLET | Freq: Every day | ORAL | Status: DC

## 2015-01-26 NOTE — Progress Notes (Signed)
Patient Name: Jeremy [name] Date of Encounter: 01/26/2015  Primary Care Provider:  Gareth Morgan, MD Primary Cardiologist:  P. Johnsie Cancel, MD   Chief Complaint  34 year old male with recent history of lower extremity edema who presents today for follow-up related to abnormal stress test and echocardiogram.  Past Medical History   Past Medical History  Diagnosis Date  . Abscess of left groin     a. s/p incision and drainage  . Type II diabetes mellitus     a. 01/2015 HbA1c = 8.9.  . Essential hypertension   . Asthma   . Morbid obesity   . Chest pain     a. 01/2015 Lexiscan MV: EF 28%, inferior, inferolateral, apical ischemia.  . Cardiomyopathy     a. 01/2015 Echo: EF 20-25%, diff HK, Gr 2 DD, Triv AI, mildly dil LA and Ao root.   Past Surgical History  Procedure Laterality Date  . Thoracotomy Left 11/08/2013    Procedure: LEFT THORACOTOMY;  Surgeon: Ivin Poot, MD;  Location: [facility];  Service: Thoracic;  Laterality: Left;    Allergies  No Known Allergies  HPI  34 year old male with the above problem list. He has no prior history of coronary artery disease but reports a long history of uncontrolled and untreated hypertension as well as a 10 year history of untreated diabetes mellitus. He is also morbidly obese. Approximately one month ago, he began to experience progressive lower extremity edema. He also noted intermittent dyspnea on exertion and intermittent rest and exertional chest tightness. He was seen in the emergency department in April and placed on diuretic therapy with recommendation for primary care follow-up. He was referred to Dr. Johnsie [name] for cardiac evaluation and yesterday underwent 2-D echocardiogram which showed severe LV dysfunction with an EF of 20-25% and diffuse hypokinesis. Stress testing was also carried out and revealed inferior, inferolateral, and apical ischemia with an EF of 28%. As a result of these abnormal findings, he was added to my schedule today and  arrangements have been made for diagnostic catheterization. Jeremy [name] reports compliance with his medications. He continues to note significant bilateral lower extremity edema to his knees. He also continues to have intermittent rest and exertional chest tightness. He denies PND, orthopnea, dizziness, syncope, or early satiety.  Home Medications  Prior to Admission medications   Medication Sig Start Date End Date Taking? Authorizing Provider  atorvastatin (LIPITOR) 80 MG tablet Take 1 tablet (80 mg total) by mouth daily. 01/10/15  Yes Milagros Loll, MD  furosemide (LASIX) 20 MG tablet Take 1 tablet (20 mg total) by mouth daily. 01/09/15 01/09/16 Yes Milagros Loll, MD  glipiZIDE (GLUCOTROL) 5 MG tablet Take 1 tablet (5 mg total) by mouth daily. 01/10/15  Yes Milagros Loll, MD  lisinopril (PRINIVIL,ZESTRIL) 10 MG tablet Take 1 tablet (10 mg total) by mouth daily. 01/15/15  Yes Josue Hector, MD  metFORMIN (GLUCOPHAGE) 1000 MG tablet Take 1 tablet (1,000 mg total) by mouth 2 (two) times daily with a meal. 01/10/15  Yes Milagros Loll, MD    Family History  Family History  Problem Relation Age of Onset  . CAD Father     died @ 64.  [name] Heart attack Father   . Congestive Heart Failure Father   . Diabetes Mellitus II Mother     died @ 39.  . Gastric cancer Mother   . Diabetes Mellitus II Sister   . CAD Sister     s/p PCI -  age 63.    Social History  History   Social History  . Marital Status: Single    Spouse Name: N/A  . Number of Children: N/A  . Years of Education: N/A   Occupational History  . Not on file.   Social History Main Topics  . Smoking status: Never Smoker   . Smokeless tobacco: Not on file  . Alcohol Use: No  . Drug Use: Yes    Special: Marijuana     Comment: smoked marijuana 2-3 x/wk.  [name] Sexual Activity:    Partners: Female   Other Topics Concern  . Not on file   Social History Narrative   Lives in [place] by himself.  Currently in [facility].           Review of Systems  General:  No chills, fever, night sweats or weight changes.  Cardiovascular:  Positive intermittent chest pain and dyspnea on exertion. Positive bilateral lower extremity edema, no orthopnea, palpitations, paroxysmal nocturnal dyspnea. Dermatological: No rash, lesions/masses Respiratory: No cough, positive dyspnea Urologic: No hematuria, dysuria Abdominal:   No nausea, vomiting, diarrhea, bright red blood per rectum, melena, or hematemesis Neurologic:  No visual changes, wkns, changes in mental status. All other systems reviewed and are otherwise negative except as noted above.  Physical Exam  VS:  BP 160/100 mmHg  Pulse 67  Ht 6\' 4"  (1.93 m)  Wt 322 lb 12.8 oz (146.421 kg)  BMI 39.31 kg/m2 , BMI Body mass index is 39.31 kg/(m^2). GEN: Well nourished, well developed, in no acute distress. HEENT: normal. Neck: Supple, no JVD, carotid bruits, or masses. Cardiac: RRR, no murmurs, rubs, or gallops. No clubbing, cyanosis. 2+ bilateral lower extremity edema to his knees.  Radials/DP/PT 2+ and equal bilaterally.  Respiratory:  Respirations regular and unlabored, clear to auscultation bilaterally. GI: Soft, nontender, nondistended, BS + x 4. MS: no deformity or atrophy. Skin: warm and dry, no rash. Neuro:  Strength and sensation are intact. Psych: Normal affect.  Accessory Clinical Findings  CBC, bmet, PT, PTT pending  Assessment & Plan  1.  Cardiomyopathy: Patient has a 4-6 week history of lower extremity edema with intermittent chest pain and dyspnea. He has now been found to have severe LV dysfunction with an EF 20-25% by echocardiogram and also evidence of ischemia on stress testing. He remains volume overloaded on exam with significant bilateral lower extremity edema. His weight is up 1 pound since yesterday and about 17 pounds since May 9. He has been scheduled for right and left heart cardiac catheterization for May 26 with Dr. Johnsie [name].  I will increase his  Lasix to 40 mg daily. He remains on lisinopril 10 mg daily. In the setting of LV dysfunction and ongoing hypertension, I will add carvedilol 3.125 mg twice a day.  We discussed the importance of daily weights, sodium restriction, medication compliance, and symptom reporting and he verbalizes understanding.   2.  Chest pain: As above, patient has been having intermittent rest and exertional chest tightness. He had stress testing yesterday which was abnormal with evidence of inferior, inferolateral, and apical ischemia. He is scheduled for catheterization next week. He remains on high potency statin therapy. I'm adding aspirin 81 mg daily, carvedilol 3.125 mg twice a day, and when necessary sublingual nitroglycerin. I will check a CBC, basic metabolic profile, PT, and PTT today.  The patient understands that risks include but are not limited to stroke (1 in 1000), death (1 in 76), kidney failure [usually temporary] (  1 in 500), bleeding (1 in 200), allergic reaction [possibly serious] (1 in 200), and agrees to proceed.    3. Essential hypertension: This remains poorly controlled. He does have some volume excess and I will increase his Lasix and also add carvedilol as above.  4. Hyperlipidemia:  He has an LDL of 321 as of May 3. He remains on Lipitor 80. LFTs were normal on April 24th.  5. Morbid obesity: Patient will benefit from eventual cardiac rehabilitation.  6. Type 2 diabetes mellitus: Poorly controlled. A1c was recently 8.9. This is followed by primary care.  7. Drug abuse: Patient smoked marijuana 2-3 times a week. Cessation advised.  8. Disposition: Labs today, catheterization next week, and we will arrange for follow-up in approximately 2 weeks.  Murray Hodgkins, NP 01/26/2015, 10:15 AM

## 2015-01-26 NOTE — Patient Instructions (Signed)
Medication Instructions:   START  TAKING LASIX 40 MG ONCE A DAY   START TAKING CARVEDILOL 3.125 MG TWICE A DAY   START TAKING ASPIRIN 81 MG ONCE A DAY   START TAKING NITROGLYCERIN FOR CHEST PAIN   Labwork:  CBC BMET PT/PTT  Testing/Procedures:Your physician has requested that you have a cardiac catheterization.  ON 02/01/15 Cardiac catheterization is used to diagnose and/or treat various heart conditions. Doctors may recommend this procedure for a number of different reasons. The most common reason is to evaluate chest pain. Chest pain can be a symptom of coronary artery disease (CAD), and cardiac catheterization can show whether plaque is narrowing or blocking your heart's arteries. This procedure is also used to evaluate the valves, as well as measure the blood flow and oxygen levels in different parts of your heart. For further information please visit HugeFiesta.[email or web address]. Please follow instruction sheet, as given.    Follow-Up:2 TO 3 WEEK POST CATH FOLLOW UP AFTER  02/01/15 WITH  AN AVAILABLE  APP ON THE SAME DAY NISHAN IS IN THE OFFICE   Any Other Special Instructions Will Be Listed Below (If Applicable).Nitroglycerin sublingual tablets What is this medicine? NITROGLYCERIN (nye troe GLI ser in) is a type of vasodilator. It relaxes blood vessels, increasing the blood and oxygen supply to your heart. This medicine is used to relieve chest pain caused by angina. It is also used to prevent chest pain before activities like climbing stairs, going outdoors in cold weather, or sexual activity. This medicine may be used for other purposes; ask your health care provider or pharmacist if you have questions. COMMON BRAND NAME(S): Nitroquick, Nitrostat, Nitrotab What should I tell my health care provider before I take this medicine? They need to know if you have any of these conditions: -anemia -head injury, recent stroke, or bleeding in the brain -liver disease -previous heart attack -an  unusual or allergic reaction to nitroglycerin, other medicines, foods, dyes, or preservatives -pregnant or trying to get pregnant -breast-feeding How should I use this medicine? Take this medicine by mouth as needed. At the first sign of an angina attack (chest pain or tightness) place one tablet under your tongue. You can also take this medicine 5 to 10 minutes before an event likely to produce chest pain. Follow the directions on the prescription label. Let the tablet dissolve under the tongue. Do not swallow whole. Replace the dose if you accidentally swallow it. It will help if your mouth is not dry. Saliva around the tablet will help it to dissolve more quickly. Do not eat or drink, smoke or chew tobacco while a tablet is dissolving. If you are not better within 5 minutes after taking ONE dose of nitroglycerin, call 9-1-1 immediately to seek emergency medical care. Do not take more than 3 nitroglycerin tablets over 15 minutes. If you take this medicine often to relieve symptoms of angina, your doctor or health care professional may provide you with different instructions to manage your symptoms. If symptoms do not go away after following these instructions, it is important to call 9-1-1 immediately. Do not take more than 3 nitroglycerin tablets over 15 minutes. Talk to your pediatrician regarding the use of this medicine in children. Special care may be needed. Overdosage: If you think you have taken too much of this medicine contact a poison control center or emergency room at once. NOTE: This medicine is only for you. Do not share this medicine with others. What if I miss a dose? This  does not apply. This medicine is only used as needed. What may interact with this medicine? Do not take this medicine with any of the following medications: -certain migraine medicines like ergotamine and dihydroergotamine (DHE) -medicines used to treat erectile dysfunction like sildenafil, tadalafil, and  vardenafil -riociguat This medicine may also interact with the following medications: -alteplase -aspirin -heparin -medicines for high blood pressure -medicines for mental depression -other medicines used to treat angina -phenothiazines like chlorpromazine, mesoridazine, prochlorperazine, thioridazine This list may not describe all possible interactions. Give your health care provider a list of all the medicines, herbs, non-prescription drugs, or dietary supplements you use. Also tell them if you smoke, drink alcohol, or use illegal drugs. Some items may interact with your medicine. What should I watch for while using this medicine? Tell your doctor or health care professional if you feel your medicine is no longer working. Keep this medicine with you at all times. Sit or lie down when you take your medicine to prevent falling if you feel dizzy or faint after using it. Try to remain calm. This will help you to feel better faster. If you feel dizzy, take several deep breaths and lie down with your feet propped up, or bend forward with your head resting between your knees. You may get drowsy or dizzy. Do not drive, use machinery, or do anything that needs mental alertness until you know how this drug affects you. Do not stand or sit up quickly, especially if you are an older patient. This reduces the risk of dizzy or fainting spells. Alcohol can make you more drowsy and dizzy. Avoid alcoholic drinks. Do not treat yourself for coughs, colds, or pain while you are taking this medicine without asking your doctor or health care professional for advice. Some ingredients may increase your blood pressure. What side effects may I notice from receiving this medicine? Side effects that you should report to your doctor or health care professional as soon as possible: -blurred vision -dry mouth -skin rash -sweating -the feeling of extreme pressure in the head -unusually weak or tired Side effects that  usually do not require medical attention (report to your doctor or health care professional if they continue or are bothersome): -flushing of the face or neck -headache -irregular heartbeat, palpitations -nausea, vomiting This list may not describe all possible side effects. Call your doctor for medical advice about side effects. You may report side effects to FDA at 1-800-FDA-1088. Where should I keep my medicine? Keep out of the reach of children. Store at room temperature between 20 and 25 degrees C (68 and 77 degrees F). Store in [employer]. Protect from light and moisture. Keep tightly closed. Throw away any unused medicine after the expiration date. NOTE: This sheet is a summary. It may not cover all possible information. If you have questions about this medicine, talk to your doctor, pharmacist, or health care provider.  2015, [employer]vier/Gold Standard. (2013-06-23 17:57:36)

## 2015-01-31 ENCOUNTER — Emergency Department (INDEPENDENT_AMBULATORY_CARE_PROVIDER_SITE_OTHER)
Admission: EM | Admit: 2015-01-31 | Discharge: 2015-01-31 | Disposition: A | Discharge status: Nursing Home (Includes ICF) | Payer: No Typology Code available for payment source | Source: Home / Self Care | Attending: Family Medicine | Admitting: Family Medicine

## 2015-01-31 ENCOUNTER — Emergency Department (HOSPITAL_COMMUNITY): Payer: No Typology Code available for payment source

## 2015-01-31 ENCOUNTER — Emergency Department (HOSPITAL_COMMUNITY)
Admission: EM | Admit: 2015-01-31 | Discharge: 2015-02-01 | Disposition: A | Discharge status: Home / Self Care | Payer: No Typology Code available for payment source | Attending: Emergency Medicine | Admitting: Emergency Medicine

## 2015-01-31 ENCOUNTER — Encounter (HOSPITAL_COMMUNITY): Payer: Self-pay | Admitting: Emergency Medicine

## 2015-01-31 DIAGNOSIS — I10 Essential (primary) hypertension: Secondary | ICD-10-CM | POA: Insufficient documentation

## 2015-01-31 DIAGNOSIS — Z7982 Long term (current) use of aspirin: Secondary | ICD-10-CM | POA: Insufficient documentation

## 2015-01-31 DIAGNOSIS — K611 Rectal abscess: Secondary | ICD-10-CM

## 2015-01-31 DIAGNOSIS — Z79899 Other long term (current) drug therapy: Secondary | ICD-10-CM | POA: Insufficient documentation

## 2015-01-31 DIAGNOSIS — E119 Type 2 diabetes mellitus without complications: Secondary | ICD-10-CM | POA: Insufficient documentation

## 2015-01-31 DIAGNOSIS — J45909 Unspecified asthma, uncomplicated: Secondary | ICD-10-CM | POA: Insufficient documentation

## 2015-01-31 DIAGNOSIS — K610 Anal abscess: Secondary | ICD-10-CM | POA: Insufficient documentation

## 2015-01-31 LAB — BASIC METABOLIC PANEL
Anion gap: 7 (ref 5–15)
BUN: 14 mg/dL (ref 6–20)
CHLORIDE: 104 mmol/L (ref 101–111)
CO2: 29 mmol/L (ref 22–32)
Calcium: 8.1 mg/dL — ABNORMAL LOW (ref 8.9–10.3)
Creatinine, Ser: 1.23 mg/dL (ref 0.61–1.24)
GFR calc non Af Amer: >60 mL/min (ref >60)
Glucose, Bld: 156 mg/dL — ABNORMAL HIGH (ref 65–99)
POTASSIUM: 3.7 mmol/L (ref 3.5–5.1)
Sodium: 140 mmol/L (ref 135–145)

## 2015-01-31 LAB — CBC
HCT: 40.9 % (ref 39.0–52.0)
HEMOGLOBIN: 13.2 g/dL (ref 13.0–17.0)
MCH: 26 pg (ref 26.0–34.0)
MCHC: 32.3 g/dL (ref 30.0–36.0)
MCV: 80.5 fL (ref 78.0–100.0)
Platelets: 299 10*3/uL (ref 150–400)
RBC: 5.08 MIL/uL (ref 4.22–5.81)
RDW: 14.3 % (ref 11.5–15.5)
WBC: 11.8 10*3/uL — ABNORMAL HIGH (ref 4.0–10.5)

## 2015-01-31 MED ORDER — SODIUM BICARBONATE 4 % IV SOLN
5.0000 mL | Freq: Once | INTRAVENOUS | Status: AC
  Administered 2015-01-31: 5 mL via SUBCUTANEOUS
  Filled 2015-01-31: qty 5

## 2015-01-31 MED ORDER — CEPHALEXIN 500 MG PO CAPS: 500.0000 mg | ORAL_CAPSULE | Freq: Four times a day (QID) | ORAL | Status: DC

## 2015-01-31 MED ORDER — OXYCODONE-ACETAMINOPHEN 5-325 MG PO TABS
2.0000 | ORAL_TABLET | Freq: Once | ORAL | Status: AC
  Administered 2015-01-31: 2 via ORAL

## 2015-01-31 MED ORDER — IOHEXOL 300 MG/ML  SOLN
100.0000 mL | Freq: Once | INTRAMUSCULAR | Status: AC | PRN
  Administered 2015-01-31: 100 mL via INTRAVENOUS

## 2015-01-31 MED ORDER — LIDOCAINE HCL (PF) 1 % IJ SOLN
10.0000 mL | Freq: Once | INTRAMUSCULAR | Status: AC
  Administered 2015-01-31: 10 mL
  Filled 2015-01-31: qty 10

## 2015-01-31 MED ORDER — OXYCODONE-ACETAMINOPHEN 5-325 MG PO TABS
ORAL_TABLET | ORAL | Status: AC
  Filled 2015-01-31: qty 2

## 2015-01-31 MED ORDER — OXYCODONE-ACETAMINOPHEN 5-325 MG PO TABS
1.0000 | ORAL_TABLET | ORAL | Status: DC | PRN
Start: 2015-01-31 — End: 2015-02-15

## 2015-01-31 NOTE — Discharge Instructions (Signed)
Perianal Abscess °An abscess is an infected area that contains a collection of pus and debris. A perianal abscess is one that occurs in the perineal area, which is the area between the anus and the scrotum in males and between the anus and the vagina in females. Perianal abscesses can vary in size. Without treatment, a perianal abscess can become larger and cause other problems. °CAUSES  °Glands in the perineal area can become plugged up with debris. When this happens, an abscess may form.  °SIGNS AND SYMPTOMS  °The most common symptoms of a perianal abscess are: °· Swelling and redness in the area of the abscess. The redness may go beyond the abscess and appear as a red streak on the skin. °· Pain in the area of the abscess. °Other possible symptoms include:  °· A visible lump or a lump that can be felt when touching the area and is usually painful. °· Bleeding or pus-like discharge from the area. °· Fever. °· General weakness. °DIAGNOSIS  °Your health care provider will take a medical history and examine the area. This may involve examining the rectal area with a gloved hand (digital rectal exam). For women, it may require a careful vaginal exam. Sometimes, the health care provider needs to look into the rectum using a probe or scope. °TREATMENT  °Treatment often requires making a cut (incision) in the abscess to drain the pus. This can sometimes be done in your health care provider's office or an emergency department after giving you medicine to numb the area (local anesthetic). For larger or deeper abscesses, surgery may be required to drain the abscess. Antibiotic medicines are sometimes given if there is infection of the surrounding tissue (cellulitis). In some cases, gauze is packed into the abscess to continue draining the area. Frequent sitz baths may be recommended to help the wound heal and to reduce the chance of the abscess coming back. °HOME CARE INSTRUCTIONS  °· Only take over-the-counter or  prescription medicines for pain, fever, or discomfort as directed by your health care provider. °· Take antibiotic medicine as directed. Make sure you finish it even if you start to feel better. °· If gauze is used in the abscess, follow your health care provider's instructions for removing or changing the gauze. It can usually be removed in 2-3 days. °· If one or more drains have been placed in the abscess cavity, be careful not to pull at them. Your health care provider will tell you how long they need to remain in place. °· Take warm sitz baths 3-4 times a day and after bowel movements. This will help reduce pain and swelling. °· Keep the skin around the abscess clean and dry. Avoid cleaning the area too much. °· Avoid scratching the abscess area. °· Avoid using colored or perfumed toilet papers. °SEEK MEDICAL CARE IF:  °· You have trouble having a bowel movement or passing urine. °· Your pain or swelling in the affected area does not seem to be improving. °· The gauze packing or the drains come out before the planned time. °SEEK IMMEDIATE MEDICAL CARE IF:  °· You have problems moving or using your legs. °· You have severe or increasing pain. °· Your swelling in the affected area suddenly gets worse. °· You have a large increase in bleeding or passing of pus. °· You have chills or a fever. °MAKE SURE YOU:  °· Understand these instructions. °· Will watch your condition. °· Will get help right away if you are   not doing well or get worse. Document Released: 10/01/2006 Document Revised: 06/15/2013 Document Reviewed: 04/06/2013 [facility] Patient Information 2015 [place], [place]. This information is not intended to replace advice given to you by your health care provider. Make sure you discuss any questions you have with your health care provider.  Incision and Drainage Incision and drainage is a procedure in which a sac-like structure (cystic structure) is opened and drained. The area to be drained usually  contains material such as pus, fluid, or blood.  LET YOUR CAREGIVER KNOW ABOUT:   Allergies to medicine.  Medicines taken, including vitamins, herbs, eyedrops, over-the-counter medicines, and creams.  Use of steroids (by mouth or creams).  Previous problems with anesthetics or numbing medicines.  History of bleeding problems or blood clots.  Previous surgery.  Other health problems, including diabetes and kidney problems.  Possibility of pregnancy, if this applies. RISKS AND COMPLICATIONS  Pain.  Bleeding.  Scarring.  Infection. BEFORE THE PROCEDURE  You may need to have an ultrasound or other imaging tests to see how large or deep your cystic structure is. Blood tests may also be used to determine if you have an infection or how severe the infection is. You may need to have a tetanus shot. PROCEDURE  The affected area is cleaned with a cleaning fluid. The cyst area will then be numbed with a medicine (local anesthetic). A small incision will be made in the cystic structure. A syringe or catheter may be used to drain the contents of the cystic structure, or the contents may be squeezed out. The area will then be flushed with a cleansing solution. After cleansing the area, it is often gently packed with a gauze or another wound dressing. Once it is packed, it will be covered with gauze and tape or some other type of wound dressing. AFTER THE PROCEDURE   Often, you will be allowed to go home right after the procedure.  You may be given antibiotic medicine to prevent or heal an infection.  If the area was packed with gauze or some other wound dressing, you will likely need to come back in 1 to 2 days to get it removed.  The area should heal in about 14 days. Document Released: 02/18/2001 Document Revised: 02/24/2012 Document Reviewed: 10/20/2011 [facility] Patient Information 2015 [place], [place]. This information is not intended to replace advice given to you by your health care  provider. Make sure you discuss any questions you have with your health care provider.

## 2015-01-31 NOTE — ED Notes (Signed)
Pt has a abscess near his rectum that has grown bigger over the last 4 days. Pt states that it is very painful

## 2015-01-31 NOTE — ED Provider Notes (Signed)
CSN: [id number]     Arrival date & time 01/31/15  1820 History  This chart was scribed for non-physician practitioner, Margarita Mail, PA-C working with Ernestina Patches, MD,  by Hansel Feinstein, ED scribe. This patient was seen in room TR01C/TR01C and the patient's care was started at 10:56 PM    Chief Complaint  Patient presents with  . Abscess   The history is provided by the patient. No language interpreter was used.    HPI Comments: Jeremy [name] is a 34 y.o. male who was sent to the ED by Dr. Juventino Slovak from Urgent Care for surgical care of his perirectal abscess.  The patient reports a history of DM, and rectal abscesses.  He presents to the Emergency Department complaining of constant, moderate pain and swelling around his rectum onset 4 days PTA. He believes that this pain is to be caused by an abscess because of the similarity of current symptoms with prior episodes of abscess. He states that his last known CBG was 165. He denies hx of hemorrhoids. He also denies fevers and chills.    Past Medical History  Diagnosis Date  . Abscess of left groin     a. s/p incision and drainage  . Type II diabetes mellitus     a. 01/2015 HbA1c = 8.9.  . Essential hypertension   . Asthma   . Morbid obesity   . Chest pain     a. 01/2015 Lexiscan MV: EF 28%, inferior, inferolateral, apical ischemia.  . Cardiomyopathy     a. 01/2015 Echo: EF 20-25%, diff HK, Gr 2 DD, Triv AI, mildly dil LA and Ao root.   Past Surgical History  Procedure Laterality Date  . Thoracotomy Left 11/08/2013    Procedure: LEFT THORACOTOMY;  Surgeon: Ivin Poot, MD;  Location: [facility] OR;  Service: Thoracic;  Laterality: Left;   Family History  Problem Relation Age of Onset  . CAD Father     died @ 73.  [name] Heart attack Father   . Congestive Heart Failure Father   . Diabetes Mellitus II Mother     died @ 19.  . Gastric cancer Mother   . Diabetes Mellitus II Sister   . CAD Sister     s/p PCI - age 58.   History  Substance  Use Topics  . Smoking status: Never Smoker   . Smokeless tobacco: Not on file  . Alcohol Use: No    Review of Systems A complete 10 system review of systems was obtained and all systems are negative except as noted in the HPI and PMH.    Allergies  Review of patient's allergies indicates no known allergies.  Home Medications   Prior to Admission medications   Medication Sig Start Date End Date Taking? Authorizing Provider  aspirin EC 81 MG tablet Take 1 tablet (81 mg total) by mouth daily. 01/26/15   Rogelia Mire, NP  atorvastatin (LIPITOR) 80 MG tablet Take 1 tablet (80 mg total) by mouth daily. 01/10/15   Milagros Loll, MD  carvedilol (COREG) 3.125 MG tablet Take 1 tablet (3.125 mg total) by mouth 2 (two) times daily. 01/26/15   Rogelia Mire, NP  furosemide (LASIX) 40 MG tablet Take 1 tablet (40 mg total) by mouth daily. 01/26/15 01/26/16  Rogelia Mire, NP  glipiZIDE (GLUCOTROL) 5 MG tablet Take 1 tablet (5 mg total) by mouth daily. 01/10/15   Milagros Loll, MD  lisinopril (PRINIVIL,ZESTRIL) 10 MG tablet Take 1 tablet (  10 mg total) by mouth daily. 01/15/15   Josue Hector, MD  metFORMIN (GLUCOPHAGE) 1000 MG tablet Take 1 tablet (1,000 mg total) by mouth 2 (two) times daily with a meal. 01/10/15   Milagros Loll, MD  nitroGLYCERIN (NITROSTAT) 0.4 MG SL tablet Place 1 tablet (0.4 mg total) under the tongue every 5 (five) minutes as needed for chest pain. 01/26/15   Rogelia Mire, NP   BP 160/99 mmHg  Pulse 106  Temp(Src) 98.3 F (36.8 C) (Oral)  Resp 16  SpO2 100% Physical Exam  Constitutional: He is oriented to person, place, and time. He appears well-developed and well-nourished. No distress.  HENT:  Head: Normocephalic and atraumatic.  Eyes: Conjunctivae and EOM are normal.  Neck: Neck supple.  Cardiovascular: Normal rate.   Pulmonary/Chest: Breath sounds normal. No respiratory distress.  Neurological: He is alert and oriented to person, place, and  time.  Skin: Skin is warm and dry.  4 cm spot with induration on the right perianal region. Centrally soft. No areas of drainage.   Psychiatric: He has a normal mood and affect. His behavior is normal.  Nursing note and vitals reviewed.   ED Course  INCISION AND DRAINAGE Date/Time: 02/06/2015 8:01 PM Performed by: Margarita Mail Authorized by: Margarita Mail Consent: Verbal consent obtained. Risks and benefits: risks, benefits and alternatives were discussed Consent given by: patient Patient understanding: patient states understanding of the procedure being performed Patient identity confirmed: provided demographic data and verbally with patient Type: abscess Body area: anogenital Location details: perianal Anesthesia: local infiltration Local anesthetic: lidocaine 1% without epinephrine and NaHCO3 (sodium bicarbonate) Anesthetic total: 4 ml Patient sedated: no Scalpel size: 11 Incision type: single straight Complexity: simple Drainage: purulent Drainage amount: copious Wound treatment: wound left open Packing material: none Patient tolerance: Patient tolerated the procedure well with no immediate complications   (including critical care time) DIAGNOSTIC STUDIES: Oxygen Saturation is 100% on RA, normal by my interpretation.    COORDINATION OF CARE: 11:05 PM Discussed treatment plan with pt at bedside which includes drainage of the abscess and pt agreed to plan.   Labs Review Labs Reviewed  CBC - Abnormal; Notable for the following:    WBC 11.8 (*)    All other components within normal limits  BASIC METABOLIC PANEL - Abnormal; Notable for the following:    Glucose, Bld 156 (*)    Calcium 8.1 (*)    All other components within normal limits    Imaging Review No results found.   EKG Interpretation None     MDM   Final diagnoses:  Perianal abscess   Patient with skin abscess amenable to incision and drainage.  Abscess was not large enough to warrant packing  or drain placement,  wound recheck in 2 days. Encouraged home warm soaks and flushing.  Mild signs of cellulitis is surrounding skin.  Will d/c to home.  No antibiotic therapy is indicated.   I personally performed the services described in this documentation, which was scribed in my presence. The recorded information has been reviewed and is accurate.    Margarita Mail, PA-C 02/06/15 2002  Ernestina Patches, MD 02/08/15 1054

## 2015-01-31 NOTE — ED Notes (Signed)
Pt. reports abscess at right buttocks onset 4 days ago with no drainage , denies fever or chills.

## 2015-01-31 NOTE — ED Provider Notes (Signed)
CSN: [id number]     Arrival date & time 01/31/15  1557 History   First MD Initiated Contact with Patient 01/31/15 1747     No chief complaint on file.  (Consider location/radiation/quality/duration/timing/severity/associated sxs/prior Treatment) Patient is a 34 y.o. male presenting with abscess. The history is provided by the patient and the spouse.  Abscess Location:  Ano-genital Ano-genital abscess location:  Anus Abscess quality: fluctuance and induration   Red streaking: no   Duration:  4 days Progression:  Worsening Chronicity:  Recurrent Relieved by:  None tried Worsened by:  Nothing tried Ineffective treatments:  None tried Associated symptoms: no fever   Risk factors: prior abscess     Past Medical History  Diagnosis Date  . Abscess of left groin     a. s/p incision and drainage  . Type II diabetes mellitus     a. 01/2015 HbA1c = 8.9.  . Essential hypertension   . Asthma   . Morbid obesity   . Chest pain     a. 01/2015 Lexiscan MV: EF 28%, inferior, inferolateral, apical ischemia.  . Cardiomyopathy     a. 01/2015 Echo: EF 20-25%, diff HK, Gr 2 DD, Triv AI, mildly dil LA and Ao root.   Past Surgical History  Procedure Laterality Date  . Thoracotomy Left 11/08/2013    Procedure: LEFT THORACOTOMY;  Surgeon: Ivin Poot, MD;  Location: [facility] OR;  Service: Thoracic;  Laterality: Left;   Family History  Problem Relation Age of Onset  . CAD Father     died @ 15.  [name] Heart attack Father   . Congestive Heart Failure Father   . Diabetes Mellitus II Mother     died @ 31.  . Gastric cancer Mother   . Diabetes Mellitus II Sister   . CAD Sister     s/p PCI - age 12.   History  Substance Use Topics  . Smoking status: Never Smoker   . Smokeless tobacco: Not on file  . Alcohol Use: No    Review of Systems  Constitutional: Negative.  Negative for fever.  Gastrointestinal: Positive for rectal pain.  Skin: Positive for rash.    Allergies  Review of patient's  allergies indicates no known allergies.  Home Medications   Prior to Admission medications   Medication Sig Start Date End Date Taking? Authorizing Provider  aspirin EC 81 MG tablet Take 1 tablet (81 mg total) by mouth daily. 01/26/15   Rogelia Mire, NP  atorvastatin (LIPITOR) 80 MG tablet Take 1 tablet (80 mg total) by mouth daily. 01/10/15   Milagros Loll, MD  carvedilol (COREG) 3.125 MG tablet Take 1 tablet (3.125 mg total) by mouth 2 (two) times daily. 01/26/15   Rogelia Mire, NP  furosemide (LASIX) 40 MG tablet Take 1 tablet (40 mg total) by mouth daily. 01/26/15 01/26/16  Rogelia Mire, NP  glipiZIDE (GLUCOTROL) 5 MG tablet Take 1 tablet (5 mg total) by mouth daily. 01/10/15   Milagros Loll, MD  lisinopril (PRINIVIL,ZESTRIL) 10 MG tablet Take 1 tablet (10 mg total) by mouth daily. 01/15/15   Josue Hector, MD  metFORMIN (GLUCOPHAGE) 1000 MG tablet Take 1 tablet (1,000 mg total) by mouth 2 (two) times daily with a meal. 01/10/15   Milagros Loll, MD  nitroGLYCERIN (NITROSTAT) 0.4 MG SL tablet Place 1 tablet (0.4 mg total) under the tongue every 5 (five) minutes as needed for chest pain. 01/26/15   Rogelia Mire, NP  BP 196/130 mmHg  Pulse 107  Temp(Src) 98 F (36.7 C) (Oral)  Resp 18  SpO2 100% Physical Exam  Constitutional: He is oriented to person, place, and time. He appears well-developed and well-nourished.  Neurological: He is alert and oriented to person, place, and time.  Skin: Skin is warm and dry. There is erythema.  3x3 cm tender fluctuant perirectal asbscess, no drainage.  Nursing note and vitals reviewed.   ED Course  Procedures (including critical care time) Labs Review Labs Reviewed - No data to display  Imaging Review No results found.   MDM   1. Perirectal abscess    Sent for surgical care of perirectal abscess.   Billy Fischer, MD 01/31/15 [phone]

## 2015-01-31 NOTE — ED Notes (Signed)
Pt will be transferred to ED via shuttle for further evaluation. Report Given to First nurse [facility] RN

## 2015-02-01 ENCOUNTER — Ambulatory Visit (HOSPITAL_COMMUNITY)
Admission: RE | Admit: 2015-02-01 | Discharge: 2015-02-01 | Disposition: A | Discharge status: Home / Self Care | Payer: No Typology Code available for payment source | Source: Ambulatory Visit | Attending: Cardiovascular Disease | Admitting: Cardiovascular Disease

## 2015-02-01 ENCOUNTER — Encounter (HOSPITAL_COMMUNITY): Payer: Self-pay | Admitting: Cardiovascular Disease

## 2015-02-01 ENCOUNTER — Encounter (HOSPITAL_COMMUNITY)
Admission: RE | Disposition: A | Discharge status: Home / Self Care | Payer: No Typology Code available for payment source | Source: Ambulatory Visit | Attending: Cardiovascular Disease

## 2015-02-01 DIAGNOSIS — I420 Dilated cardiomyopathy: Secondary | ICD-10-CM | POA: Insufficient documentation

## 2015-02-01 DIAGNOSIS — I10 Essential (primary) hypertension: Secondary | ICD-10-CM | POA: Insufficient documentation

## 2015-02-01 DIAGNOSIS — F17210 Nicotine dependence, cigarettes, uncomplicated: Secondary | ICD-10-CM | POA: Insufficient documentation

## 2015-02-01 DIAGNOSIS — E119 Type 2 diabetes mellitus without complications: Secondary | ICD-10-CM | POA: Insufficient documentation

## 2015-02-01 DIAGNOSIS — J45909 Unspecified asthma, uncomplicated: Secondary | ICD-10-CM | POA: Insufficient documentation

## 2015-02-01 DIAGNOSIS — Z79899 Other long term (current) drug therapy: Secondary | ICD-10-CM | POA: Insufficient documentation

## 2015-02-01 DIAGNOSIS — I5020 Unspecified systolic (congestive) heart failure: Secondary | ICD-10-CM

## 2015-02-01 HISTORY — PX: CARDIAC CATHETERIZATION: SHX172

## 2015-02-01 LAB — GLUCOSE, CAPILLARY
Glucose-Capillary: 164 mg/dL — ABNORMAL HIGH (ref 65–99)
Glucose-Capillary: 152 mg/dL — ABNORMAL HIGH (ref 65–99)

## 2015-02-01 LAB — POCT I-STAT 3, ART BLOOD GAS (G3+)
ACID-BASE EXCESS: 1 mmol/L (ref 0.0–2.0)
BICARBONATE: 23.9 meq/L (ref 20.0–24.0)
O2 Saturation: 96 %
PO2 ART: 74 mmHg — AB (ref 80.0–100.0)
TCO2: 25 mmol/L (ref 0–100)
pCO2 arterial: 32.9 mmHg — ABNORMAL LOW (ref 35.0–45.0)
pH, Arterial: 7.47 — ABNORMAL HIGH (ref 7.350–7.450)

## 2015-02-01 LAB — POCT I-STAT 3, VENOUS BLOOD GAS (G3P V)
Acid-Base Excess: 1 mmol/L (ref 0.0–2.0)
BICARBONATE: 25.4 meq/L — AB (ref 20.0–24.0)
O2 Saturation: 75 %
PCO2 VEN: 36.8 mmHg — AB (ref 45.0–50.0)
PH VEN: 7.447 — AB (ref 7.250–7.300)
PO2 VEN: 38 mmHg (ref 30.0–45.0)
TCO2: 27 mmol/L (ref 0–100)

## 2015-02-01 SURGERY — RIGHT/LEFT HEART CATH AND CORONARY ANGIOGRAPHY

## 2015-02-01 MED ORDER — SODIUM CHLORIDE 0.9 % IJ SOLN
3.0000 mL | Freq: Two times a day (BID) | INTRAMUSCULAR | Status: DC
Start: 2015-02-01 — End: 2015-02-01

## 2015-02-01 MED ORDER — LABETALOL HCL 5 MG/ML IV SOLN
INTRAVENOUS | Status: AC
  Filled 2015-02-01: qty 4

## 2015-02-01 MED ORDER — FENTANYL CITRATE (PF) 100 MCG/2ML IJ SOLN
INTRAMUSCULAR | Status: AC
  Filled 2015-02-01: qty 2

## 2015-02-01 MED ORDER — FENTANYL CITRATE (PF) 100 MCG/2ML IJ SOLN
INTRAMUSCULAR | Status: DC | PRN
  Administered 2015-02-01: 25 ug via INTRAVENOUS

## 2015-02-01 MED ORDER — HEPARIN SODIUM (PORCINE) 1000 UNIT/ML IJ SOLN
INTRAMUSCULAR | Status: DC | PRN
  Administered 2015-02-01: 5000 [IU] via INTRAVENOUS

## 2015-02-01 MED ORDER — HEPARIN (PORCINE) IN NACL 2-0.9 UNIT/ML-% IJ SOLN
INTRAMUSCULAR | Status: AC
  Filled 2015-02-01: qty 500

## 2015-02-01 MED ORDER — NITROGLYCERIN 0.2 MG/ML ON CALL CATH LAB
INTRAVENOUS | Status: DC | PRN
  Administered 2015-02-01: 200 ug via INTRACORONARY

## 2015-02-01 MED ORDER — SODIUM CHLORIDE 0.9 % IJ SOLN: 3.0000 mL | INTRAMUSCULAR | Status: DC | PRN

## 2015-02-01 MED ORDER — NITROGLYCERIN 1 MG/10 ML FOR IR/CATH LAB
INTRA_ARTERIAL | Status: AC
  Filled 2015-02-01: qty 10

## 2015-02-01 MED ORDER — IOHEXOL 300 MG/ML  SOLN
INTRAMUSCULAR | Status: DC | PRN
  Administered 2015-02-01: 65 mL via INTRA_ARTICULAR

## 2015-02-01 MED ORDER — SODIUM CHLORIDE 0.9 % WEIGHT BASED INFUSION: 1.0000 mL/kg/h | INTRAVENOUS | Status: AC

## 2015-02-01 MED ORDER — SODIUM CHLORIDE 0.9 % IV SOLN: 250.0000 mL | INTRAVENOUS | Status: DC | PRN

## 2015-02-01 MED ORDER — MIDAZOLAM HCL 2 MG/2ML IJ SOLN
INTRAMUSCULAR | Status: AC
  Filled 2015-02-01: qty 2

## 2015-02-01 MED ORDER — HYDRALAZINE HCL 20 MG/ML IJ SOLN
INTRAMUSCULAR | Status: AC
  Administered 2015-02-01: 10 mg via INTRAVENOUS
  Filled 2015-02-01: qty 1

## 2015-02-01 MED ORDER — ASPIRIN 81 MG PO CHEW
81.0000 mg | CHEWABLE_TABLET | ORAL | Status: DC
Start: 2015-02-02 — End: 2015-02-01

## 2015-02-01 MED ORDER — SODIUM CHLORIDE 0.9 % IV SOLN: INTRAVENOUS | Status: DC

## 2015-02-01 MED ORDER — VERAPAMIL HCL 2.5 MG/ML IV SOLN
INTRAVENOUS | Status: DC | PRN
  Administered 2015-02-01: 11:00:00 via INTRA_ARTERIAL

## 2015-02-01 MED ORDER — VERAPAMIL HCL 2.5 MG/ML IV SOLN
INTRAVENOUS | Status: AC
  Filled 2015-02-01: qty 2

## 2015-02-01 MED ORDER — HYDRALAZINE HCL 20 MG/ML IJ SOLN
10.0000 mg | Freq: Once | INTRAMUSCULAR | Status: AC
Start: 2015-02-01 — End: 2015-02-01
  Administered 2015-02-01: 10 mg via INTRAVENOUS

## 2015-02-01 MED ORDER — HEPARIN SODIUM (PORCINE) 1000 UNIT/ML IJ SOLN
INTRAMUSCULAR | Status: AC
  Filled 2015-02-01: qty 1

## 2015-02-01 MED ORDER — OXYCODONE-ACETAMINOPHEN 5-325 MG PO TABS
1.0000 | ORAL_TABLET | ORAL | Status: DC | PRN
  Administered 2015-02-01: 2 via ORAL

## 2015-02-01 MED ORDER — SODIUM CHLORIDE 0.9 % IJ SOLN: 3.0000 mL | Freq: Two times a day (BID) | INTRAMUSCULAR | Status: DC

## 2015-02-01 MED ORDER — OXYCODONE-ACETAMINOPHEN 5-325 MG PO TABS
ORAL_TABLET | ORAL | Status: AC
  Filled 2015-02-01: qty 2

## 2015-02-01 MED ORDER — MIDAZOLAM HCL 2 MG/2ML IJ SOLN
INTRAMUSCULAR | Status: DC | PRN
  Administered 2015-02-01 (×2): 2 mg via INTRAVENOUS

## 2015-02-01 MED ORDER — VERAPAMIL HCL 2.5 MG/ML IV SOLN
INTRAVENOUS | Status: DC | PRN
Start: 2015-02-01 — End: 2015-02-01
  Administered 2015-02-01: 300 ug via INTRACORONARY

## 2015-02-01 MED ORDER — DIAZEPAM 2 MG PO TABS
2.0000 mg | ORAL_TABLET | Freq: Four times a day (QID) | ORAL | Status: DC | PRN
  Filled 2015-02-01: qty 1

## 2015-02-01 MED ORDER — LABETALOL HCL 5 MG/ML IV SOLN
INTRAVENOUS | Status: DC | PRN
  Administered 2015-02-01: 20 mg via INTRAVENOUS

## 2015-02-01 SURGICAL SUPPLY — 23 items
CATH BALLN WEDGE 5F 110CM (CATHETERS) ×1 IMPLANT
CATH INFINITI 5 FR JL3.5 (CATHETERS) ×2 IMPLANT
CATH INFINITI 5FR ANG PIGTAIL (CATHETERS) ×2 IMPLANT
CATH INFINITI 5FR MULTPACK ANG (CATHETERS) IMPLANT
CATH INFINITI JR4 5F (CATHETERS) ×2 IMPLANT
CATH LAUNCHER 5F RADL (CATHETERS) IMPLANT
CATH LAUNCHER 5F RADR (CATHETERS) IMPLANT
CATH SWAN GANZ 7F STRAIGHT (CATHETERS) IMPLANT
CATHETER LAUNCHER 5F RADL (CATHETERS) ×2
CATHETER LAUNCHER 5F RADR (CATHETERS) ×2
DEVICE RAD COMP TR BAND LRG (VASCULAR PRODUCTS) ×2 IMPLANT
GLIDESHEATH SLEND SS 6F .021 (SHEATH) ×2 IMPLANT
KIT HEART LEFT (KITS) ×2 IMPLANT
KIT HEART RIGHT NAMIC (KITS) ×1 IMPLANT
PACK CARDIAC CATHETERIZATION (CUSTOM PROCEDURE TRAY) ×2 IMPLANT
SHEATH FAST CATH BRACH 5F 5CM (SHEATH) ×1 IMPLANT
SHEATH PINNACLE 5F 10CM (SHEATH) IMPLANT
SHEATH PINNACLE 7F 10CM (SHEATH) IMPLANT
SYR MEDRAD MARK V 150ML (SYRINGE) ×2 IMPLANT
TRANSDUCER W/STOPCOCK (MISCELLANEOUS) ×3 IMPLANT
TUBING CIL FLEX 10 FLL-RA (TUBING) ×2 IMPLANT
WIRE EMERALD 3MM-J .035X150CM (WIRE) IMPLANT
WIRE SAFE-T 1.5MM-J .035X260CM (WIRE) ×2 IMPLANT

## 2015-02-01 NOTE — Interval H&P Note (Signed)
History and Physical Interval Note:  02/01/2015 9:40 AM  Jeremy [name]  has presented today for surgery, with the diagnosis of abnormal mioview/low ef  The various methods of treatment have been discussed with the patient and family. After consideration of risks, benefits and other options for treatment, the patient has consented to  Procedure(s): Right/Left Heart Cath and Coronary Angiography (N/A) as a surgical intervention .  The patient's history has been reviewed, patient examined, no change in status, stable for surgery.  I have reviewed the patient's chart and labs.  Questions were answered to the patient's satisfaction.     Jenkins Rouge

## 2015-02-01 NOTE — Progress Notes (Signed)
Site area: rt ac venous sheath Site Prior to Removal:  Level 0 Pressure Applied For:  15 minutes Manual:   yes Patient Status During Pull:  stable Post Pull Site:  Level  0   Post Pull Instructions Given:  yes Post Pull Pulses Present: yes Dressing Applied:  Small tegaderm Bedrest begins @  [id number] Comments:  none

## 2015-02-01 NOTE — H&P (View-Only) (Signed)
Cardiology Office Note   Date:  01/14/2015   ID:  Jeremy [name], DOB [birth year], MRN [id number]  PCP:  No primary care provider on file.  Cardiologist:   Jenkins Rouge, MD   No chief complaint on file.     History of Present Illness: Jeremy [name] is a 34 y.o. male who presents for evaluation of HTN, Edema, chest pain and palpitations .  He was seen in the ED 12/31/2014 for the bilateral LE edema. EKG with T wave inversions in lateral leads. Albumin 1.4. UA with >300 protein. He was started on HCTZ 25mg  daily. He also had his other medications refilled - he had not been on his medications for a while and was only taking metformin.  May 2016 changed to lasix by IM . 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 He has also noted palpitations intermittently. He has chest pain with the palpitations. The pain is located midsternum without radiation. No exacerbating or relieving factors. Not associated with exertion. Occasional associated diaphoresis. Denies LH/dizziness, SOB, nausea. Also notes fatigue. Sister has DM and CHF with CAD  She is only 66.    Now compliant with meds  Denies excess ETOH/Drugs      Past Medical History  Diagnosis Date  . Abscess of left groin     s/p incision and drainage  . Diabetes mellitus     Uncontrolled A1C 5/12 13  . Diabetes mellitus without complication   . Hypertension   . Asthma     Past Surgical History  Procedure Laterality Date  . Thoracotomy Left 11/08/2013    Procedure: LEFT THORACOTOMY;  Surgeon: Ivin Poot, MD;  Location: [facility] OR;  Service: Thoracic;  Laterality: Left;     Current Outpatient Prescriptions  Medication Sig Dispense Refill  . acetaminophen (TYLENOL) 500 MG tablet Take 2 tablets (1,000 mg total) by mouth every 8 (eight) hours as needed for headache. 30 tablet 0  . atorvastatin (LIPITOR) 80 MG tablet Take 1 tablet (80 mg total) by mouth daily. 90 tablet 2  . furosemide (LASIX) 20 MG tablet Take 1 tablet (20 mg total)  by mouth daily. 30 tablet 1  . glipiZIDE (GLUCOTROL) 5 MG tablet Take 1 tablet (5 mg total) by mouth daily. 30 tablet 3  . lisinopril (PRINIVIL,ZESTRIL) 5 MG tablet Take 1 tablet (5 mg total) by mouth daily. 30 tablet 3  . metFORMIN (GLUCOPHAGE) 1000 MG tablet Take 1 tablet (1,000 mg total) by mouth 2 (two) times daily with a meal. 30 tablet 2   No current facility-administered medications for this visit.    Allergies:   Review of patient's allergies indicates no known allergies.    Social History:  The patient  reports that he has been smoking Cigarettes.  He does not have any smokeless tobacco history on file. He reports that he drinks alcohol. He reports that he uses illicit drugs (Marijuana).   Family History:  The patient's family history is not on file.    ROS:  Please see the history of present illness.   Otherwise, review of systems are positive for none.   All other systems are reviewed and negative.    PHYSICAL EXAM: VS:  There were no vitals taken for this visit. , BMI There is no weight on file to calculate BMI. Affect appropriate Overweight black male  HEENT: normal Neck supple with no adenopathy JVP normal no bruits no thyromegaly Lungs clear with no wheezing and good diaphragmatic motion Heart:  S1/S2 no murmur, no rub, gallop or click PMI normal Abdomen: benighn, BS positve, no tenderness, no AAA no bruit.  No HSM or HJR Distal pulses intact with no bruits Plus 2 LE  edema Neuro non-focal Skin warm and dry multiple tatoos No muscular weakness    EKG:   12/31/14  SR nonspecific ST/T wave changes  01/15/15  SR rate 82  Anterolateral T wave changes    Recent Labs: 12/31/2014: ALT 12; BUN 14; Creatinine 1.08; Hemoglobin 12.3*; Platelets 283; Potassium 3.7; Sodium 141 01/09/2015: TSH 2.556    Lipid Panel    Component Value Date/Time   CHOL 422* 01/09/2015 1634   TRIG 281* 01/09/2015 1634   HDL 45 01/09/2015 1634   CHOLHDL 9.4 01/09/2015 1634   VLDL 56*  01/09/2015 1634   LDLCALC 321* 01/09/2015 1634      Wt Readings from Last 3 Encounters:  01/09/15 300 lb 6.4 oz (136.261 kg)  12/31/14 325 lb 3.2 oz (147.51 kg)  11/23/13 284 lb (128.822 kg)      Other studies Reviewed: Additional studies/ records that were reviewed today include:  Epic records.    ASSESSMENT AND PLAN:  1.  Chest Pain: with abnormal ECG  F/U stress myovue 2. Dyspnea:  Etiology not clear  F/u echo 3. Palpitaitons;  Persistent r/o structural heart disease see above  Event monitor since he gets them daily 4. Edema:  ? Nephrotic syndrome  Has MRI kidney scheduled f/u primary consider increasing lasix 5. HTN:  Increase lisinopril to 10 mg     Current medicines are reviewed at length with the patient today.  The patient does not have concerns regarding medicines.  The following changes have been made:  Lisinopril increase 5-> 10 mg  Labs/ tests ordered today include:  Stress myovue, Echo event monitor   No orders of the defined types were placed in this encounter.     Disposition:   FU with me post test next available      Signed, Jenkins Rouge, MD  01/14/2015 9:08 PM    [facility] [place], [place], [place]  [address] Phone: [phone]; Fax: [phone]

## 2015-02-01 NOTE — Discharge Instructions (Signed)
Radial Site Care Refer to this sheet in the next few weeks. These instructions provide you with information on caring for yourself after your procedure. Your caregiver may also give you more specific instructions. Your treatment has been planned according to current medical practices, but problems sometimes occur. Call your caregiver if you have any problems or questions after your procedure. HOME CARE INSTRUCTIONS  You may shower the day after the procedure.Remove the bandage (dressing) and gently wash the site with plain soap and water.Gently pat the site dry.  Do not apply powder or lotion to the site.  Do not submerge the affected site in water for 3 to 5 days.  Inspect the site at least twice daily.  Do not flex or bend the affected arm for 24 hours.  No lifting over 5 pounds (2.3 kg) for 5 days after your procedure.  Do not drive home if you are discharged the same day of the procedure. Have someone else drive you.  You may drive 24 hours after the procedure unless otherwise instructed by your caregiver.  Do not operate machinery or power tools for 24 hours.  A responsible adult should be with you for the first 24 hours after you arrive home. What to expect:  Any bruising will usually fade within 1 to 2 weeks.  Blood that collects in the tissue (hematoma) may be painful to the touch. It should usually decrease in size and tenderness within 1 to 2 weeks. SEEK IMMEDIATE MEDICAL CARE IF:  You have unusual pain at the radial site.  You have redness, warmth, swelling, or pain at the radial site.  You have drainage (other than a small amount of blood on the dressing).  You have chills.  You have a fever or persistent symptoms for more than 72 hours.  You have a fever and your symptoms suddenly get worse.  Your arm becomes pale, cool, tingly, or numb.  You have heavy bleeding from the site. Hold pressure on the site. Document Released: 09/27/2010 Document Revised:  11/17/2011 Document Reviewed: 09/27/2010 [facility] Patient Information 2015 [place], [place]. This information is not intended to replace advice given to you by your health care provider. Make sure you discuss any questions you have with your health care provider.    NO METFORMIN OR GLUCOPHAGE FOR 2 DAYS

## 2015-02-02 MED FILL — Heparin Sodium (Porcine) 2 Unit/ML in Sodium Chloride 0.9%: INTRAMUSCULAR | Qty: 1500 | Status: AC

## 2015-02-15 ENCOUNTER — Ambulatory Visit (INDEPENDENT_AMBULATORY_CARE_PROVIDER_SITE_OTHER): Payer: No Typology Code available for payment source | Admitting: Pulmonary Disease

## 2015-02-15 ENCOUNTER — Encounter: Payer: Self-pay | Admitting: Pulmonary Disease

## 2015-02-15 VITALS — BP 128/83 | HR 96 | Temp 98.2°F | Ht 77.0 in | Wt 331.1 lb

## 2015-02-15 DIAGNOSIS — I10 Essential (primary) hypertension: Secondary | ICD-10-CM

## 2015-02-15 DIAGNOSIS — R609 Edema, unspecified: Secondary | ICD-10-CM

## 2015-02-15 DIAGNOSIS — R600 Localized edema: Secondary | ICD-10-CM

## 2015-02-15 DIAGNOSIS — E785 Hyperlipidemia, unspecified: Secondary | ICD-10-CM

## 2015-02-15 DIAGNOSIS — E119 Type 2 diabetes mellitus without complications: Secondary | ICD-10-CM

## 2015-02-15 LAB — GLUCOSE, CAPILLARY: GLUCOSE-CAPILLARY: 166 mg/dL — AB (ref 65–99)

## 2015-02-15 MED ORDER — CARVEDILOL 3.125 MG PO TABS: 3.1250 mg | ORAL_TABLET | Freq: Two times a day (BID) | ORAL | Status: DC

## 2015-02-15 MED ORDER — GLIPIZIDE 5 MG PO TABS: 5.0000 mg | ORAL_TABLET | Freq: Every day | ORAL | Status: DC

## 2015-02-15 MED ORDER — LISINOPRIL 10 MG PO TABS: 10.0000 mg | ORAL_TABLET | Freq: Every day | ORAL | Status: DC

## 2015-02-15 MED ORDER — ATORVASTATIN CALCIUM 80 MG PO TABS: 80.0000 mg | ORAL_TABLET | Freq: Every day | ORAL | Status: DC

## 2015-02-15 MED ORDER — METFORMIN HCL 1000 MG PO TABS: 1000.0000 mg | ORAL_TABLET | Freq: Two times a day (BID) | ORAL | Status: DC

## 2015-02-15 MED ORDER — FUROSEMIDE 40 MG PO TABS: 60.0000 mg | ORAL_TABLET | Freq: Every day | ORAL | Status: DC

## 2015-02-15 NOTE — Addendum Note (Signed)
Addended by: Yvonna Alanis E on: 02/15/2015 11:43 AM   Modules accepted: Orders

## 2015-02-15 NOTE — Progress Notes (Signed)
Subjective:   Patient ID: Jeremy [name], male    DOB: [birth year], 34 y.o.   MRN: [id number]  HPI Mr. Adonai Colbeck is a 34 year old man with history of T2DM, HTN, Asthma, systolic CHF, cardiomyopathy, HLD presenting for follow up.  He reports his BLE swelling has not improved.  Denies episodes of symptomatic hypoglycemia.  Review of Systems Constitutional: no fevers/chills Eyes: no vision changes Ears, nose, mouth, throat, and face: no cough Respiratory: no shortness of breath Cardiovascular: no chest pain Gastrointestinal: no nausea/vomiting, no abdominal pain, no constipation, no diarrhea Genitourinary: no dysuria, no hematuria Integument: no rash Hematologic/lymphatic: no bleeding/bruising, + edema Musculoskeletal: no arthralgias, no myalgias Neurological: no paresthesias, no weakness  Past Medical History  Diagnosis Date  . Abscess of left groin     a. s/p incision and drainage  . Type II diabetes mellitus     a. 01/2015 HbA1c = 8.9.  . Essential hypertension   . Asthma   . Morbid obesity   . Chest pain     a. 01/2015 Lexiscan MV: EF 28%, inferior, inferolateral, apical ischemia.  . Cardiomyopathy     a. 01/2015 Echo: EF 20-25%, diff HK, Gr 2 DD, Triv AI, mildly dil LA and Ao root.    Current Outpatient Prescriptions on File Prior to Visit  Medication Sig Dispense Refill  . aspirin EC 81 MG tablet Take 1 tablet (81 mg total) by mouth daily. 30 tablet 0  . atorvastatin (LIPITOR) 80 MG tablet Take 1 tablet (80 mg total) by mouth daily. 90 tablet 2  . carvedilol (COREG) 3.125 MG tablet Take 1 tablet (3.125 mg total) by mouth 2 (two) times daily. 60 tablet 6  . cephALEXin (KEFLEX) 500 MG capsule Take 1 capsule (500 mg total) by mouth 4 (four) times daily. 20 capsule 0  . furosemide (LASIX) 40 MG tablet Take 1 tablet (40 mg total) by mouth daily. 30 tablet 6  . glipiZIDE (GLUCOTROL) 5 MG tablet Take 1 tablet (5 mg total) by mouth daily. 30 tablet 3  . lisinopril  (PRINIVIL,ZESTRIL) 10 MG tablet Take 1 tablet (10 mg total) by mouth daily. 30 tablet 11  . metFORMIN (GLUCOPHAGE) 1000 MG tablet Take 1 tablet (1,000 mg total) by mouth 2 (two) times daily with a meal. 30 tablet 2  . nitroGLYCERIN (NITROSTAT) 0.4 MG SL tablet Place 1 tablet (0.4 mg total) under the tongue every 5 (five) minutes as needed for chest pain. 25 tablet 3  . oxyCODONE-acetaminophen (PERCOCET) 5-325 MG per tablet Take 1-2 tablets by mouth every 4 (four) hours as needed. 20 tablet 0   No current facility-administered medications on file prior to visit.    Today's Vitals   02/15/15 1546  BP: 128/83  Pulse: 96  Temp: 98.2 F (36.8 C)  TempSrc: Oral  Height: 6\' 5"  (1.956 m)  Weight: 331 lb 1.6 oz (150.186 kg)  SpO2: 100%  PainSc: 0-No pain   Objective:  Physical Exam  Constitutional: He is oriented to person, place, and time. He appears well-developed and well-nourished.  HENT:  Head: Normocephalic and atraumatic.  Mouth/Throat: Oropharynx is clear and moist.  Eyes: Conjunctivae and EOM are normal.  Neck: Neck supple.  Cardiovascular: Normal rate and regular rhythm.   No murmur heard. Pulmonary/Chest: Effort normal and breath sounds normal. He has no wheezes. He has no rales.  Abdominal: Soft. He exhibits no distension.  Musculoskeletal: He exhibits edema (2+ BLE pitting). He exhibits no tenderness.  Neurological: He is alert and oriented  to person, place, and time.  Skin: Skin is warm and dry.  Psychiatric: He has a normal mood and affect.   Assessment & Plan:  Please refer to problem based charting.

## 2015-02-15 NOTE — Patient Instructions (Signed)
General Instructions:   Please bring your medicines with you each time you come to clinic.  Medicines may include prescription medications, over-the-counter medications, herbal remedies, eye drops, vitamins, or other pills.   Progress Toward Treatment Goals:  Treatment Goal 02/15/2015  Hemoglobin A1C improved  Blood pressure at goal

## 2015-02-16 ENCOUNTER — Encounter: Payer: Self-pay | Admitting: Pulmonary Disease

## 2015-02-16 LAB — LIPID PANEL
Cholesterol: 404 mg/dL — ABNORMAL HIGH (ref 0–200)
HDL: 46 mg/dL (ref >=40)
LDL CALC: 307 mg/dL — AB (ref 0–99)
Total CHOL/HDL Ratio: 8.8 Ratio
Triglycerides: 254 mg/dL — ABNORMAL HIGH (ref <150)
VLDL: 51 mg/dL — ABNORMAL HIGH (ref 0–40)

## 2015-02-16 NOTE — Assessment & Plan Note (Addendum)
Reports compliance of atorvastatin 80mg  daily. Cholesterol and LDL remain elevated as shown below. Discussed with cardiology who will have him referred to lipid clinic.  Lipid Panel     Component Value Date/Time   CHOL 404* 02/15/2015 1635   TRIG 254* 02/15/2015 1635   HDL 46 02/15/2015 1635   CHOLHDL 8.8 02/15/2015 1635   VLDL 51* 02/15/2015 1635   LDLCALC 307* 02/15/2015 1635   Addendum: Patient will be followed in cardiology lipid clinic. Started on Zetia 10 mg daily.

## 2015-02-16 NOTE — Assessment & Plan Note (Addendum)
Lab Results  Component Value Date   HGBA1C 8.9 01/09/2015   HGBA1C 9.7* 11/12/2013   HGBA1C 8.0 01/14/2012     Assessment: Diabetes control: fair control Progress toward A1C goal:  improved Comments: He reports his blood glucose is usually in the 160s. No symptomatic hypoglycemia.  Plan: Medications:  Continue metformin 1000mg  BID, and glipizide 5mg  daily Other plans:  -Recheck Hgb A1c in 2 months

## 2015-02-16 NOTE — Assessment & Plan Note (Signed)
BP Readings from Last 3 Encounters:  02/15/15 128/83  02/01/15 152/84  02/01/15 165/86    Lab Results  Component Value Date   NA 140 01/31/2015   K 3.7 01/31/2015   CREATININE 1.23 01/31/2015    Assessment: Blood pressure control: controlled Progress toward BP goal:  at goal  Plan: Medications:  Continue current medications including carvedilol 3.125 mg BID, lisinopril 10mg  daily, increase lasix to 60mg  daily

## 2015-02-16 NOTE — Assessment & Plan Note (Addendum)
Continues to have peripheral edema. Weight is up to 331 today from 322 on 01/26/2015.  Plan: -Increase Lasix to 60mg  daily -Recommended compression stockings and leg elevation

## 2015-02-17 NOTE — Progress Notes (Signed)
Medicine attending: Medical history, presenting problems, physical findings, and medications, reviewed with Dr Jennifer Krall and I concur with her evaluation and management plan. 

## 2015-02-20 ENCOUNTER — Encounter: Payer: Self-pay | Admitting: *Deleted

## 2015-02-21 ENCOUNTER — Telehealth: Payer: Self-pay | Admitting: *Deleted

## 2015-02-21 ENCOUNTER — Telehealth: Payer: Self-pay | Admitting: Cardiovascular Disease

## 2015-02-21 MED ORDER — EZETIMIBE 10 MG PO TABS: 10.0000 mg | ORAL_TABLET | Freq: Every day | ORAL | Status: DC

## 2015-02-21 NOTE — Telephone Encounter (Signed)
-----   Message from Josue Hector, MD sent at 02/19/2015  1:39 PM EDT ----- Regarding: FW: Hyperlipidemia Therapy Start Zetia 10 mg daily and get in with Gay Filler to lipid clinic ASAP  ----- Message -----    From: Aris Georgia, [facility]    Sent: 02/19/2015  12:39 PM      To: Josue Hector, MD, Milagros Loll, MD Subject: RE: Hyperlipidemia Therapy                     This is a great patient for the Lipid Clinic.  Right now he is borderline for qualifying for PCSK-9 inhibitor from what I have found in the chart, but if he has any xanthomas or corneal arcus then it will be an easy medication to get covered.  In the interim, I would suggest to go ahead and start him on Zetia 10mg  daily along with his atorvastatin 80mg  for additional LDL lowering until he is able to come see me.  Thanks for the referral.  If you have any other young patients with a family history like his I would be glad to review them for FH as well.  Gay Filler   ----- Message -----    From: Josue Hector, MD    Sent: 02/16/2015   3:37 PM      To: Aris Georgia, RPH, Milagros Loll, MD Subject: RE: Hyperlipidemia Therapy                     Can refer him to our lipid clinic  I forwarded to Elberta Leatherwood  ----- Message -----    From: Milagros Loll, MD    Sent: 02/16/2015   9:59 AM      To: Josue Hector, MD Subject: Hyperlipidemia Therapy                         Dr. Baxter Kail the PCP for Mr. Zayvion Koltun. I have him on atorvastatin 80mg  daily for his primary prevention of CVD and hyperlipidemia. I rechecked his lipid panel to check the response he has had on the statin and his total cholesterol is down from 422 to 404 and LDL from 321 to 307. I know that some guidelines don't recommend adding a nonstatin lipid-lowering medication for primary prevention, but do you think in this case he would benefit from the addition of something like a PCSK9 inhibitor? If so, which clinic would I refer him to for consideration of  this?  Thank you, Jacques Earthly

## 2015-02-21 NOTE — Telephone Encounter (Signed)
PT AWARE  MED  SENT  VIA EPIC  AND  MESSAGE  SENT  TO  SCHEDULERS   TO MAKE LIPID  CLINIC  APPT .Adonis Housekeeper

## 2015-02-21 NOTE — Telephone Encounter (Signed)
Pt called back she is aware of appt .

## 2015-02-22 ENCOUNTER — Ambulatory Visit (INDEPENDENT_AMBULATORY_CARE_PROVIDER_SITE_OTHER): Payer: No Typology Code available for payment source | Admitting: Pharmacist

## 2015-02-22 ENCOUNTER — Ambulatory Visit (INDEPENDENT_AMBULATORY_CARE_PROVIDER_SITE_OTHER): Payer: No Typology Code available for payment source | Admitting: Nurse Practitioner

## 2015-02-22 ENCOUNTER — Encounter: Payer: Self-pay | Admitting: Nurse Practitioner

## 2015-02-22 VITALS — BP 140/98 | HR 82 | Ht 77.0 in | Wt 332.4 lb

## 2015-02-22 DIAGNOSIS — E785 Hyperlipidemia, unspecified: Secondary | ICD-10-CM

## 2015-02-22 DIAGNOSIS — I10 Essential (primary) hypertension: Secondary | ICD-10-CM

## 2015-02-22 DIAGNOSIS — E669 Obesity, unspecified: Secondary | ICD-10-CM

## 2015-02-22 DIAGNOSIS — E119 Type 2 diabetes mellitus without complications: Secondary | ICD-10-CM

## 2015-02-22 DIAGNOSIS — I428 Other cardiomyopathies: Secondary | ICD-10-CM | POA: Insufficient documentation

## 2015-02-22 DIAGNOSIS — I429 Cardiomyopathy, unspecified: Secondary | ICD-10-CM

## 2015-02-22 DIAGNOSIS — I5022 Chronic systolic (congestive) heart failure: Secondary | ICD-10-CM

## 2015-02-22 LAB — BASIC METABOLIC PANEL
BUN: 14 mg/dL (ref 6–23)
CHLORIDE: 108 meq/L (ref 96–112)
CO2: 29 mEq/L (ref 19–32)
CREATININE: 1.04 mg/dL (ref 0.40–1.50)
Calcium: 8.1 mg/dL — ABNORMAL LOW (ref 8.4–10.5)
GFR: 104.58 mL/min (ref >60.00)
Glucose, Bld: 145 mg/dL — ABNORMAL HIGH (ref 70–99)
POTASSIUM: 3.8 meq/L (ref 3.5–5.1)
Sodium: 140 mEq/L (ref 135–145)

## 2015-02-22 MED ORDER — SPIRONOLACTONE 25 MG PO TABS: 12.5000 mg | ORAL_TABLET | Freq: Every day | ORAL | Status: DC

## 2015-02-22 MED ORDER — FUROSEMIDE 40 MG PO TABS: 60.0000 mg | ORAL_TABLET | Freq: Two times a day (BID) | ORAL | Status: DC

## 2015-02-22 NOTE — Progress Notes (Addendum)
Patient Name: Clearance Christe Date of Encounter: 02/22/2015  Primary Care Provider:  Charlott Rakes, MD Primary Cardiologist:  P. Johnsie Cancel, MD   Chief Complaint   34 year old [place] American male with a history of nonischemic myopathy who presents for follow-up.  Past Medical History   Past Medical History  Diagnosis Date  . Type II diabetes mellitus     a. 01/2015 HbA1c = 8.9.  . Essential hypertension   . Asthma   . Morbid obesity   . Chest pain     a. 01/2015 Lexiscan MV: EF 28%, inferior, inferolateral, apical ischemia;  b. 01/2015 Cath: nl cors, PCWP 18 mmHg, CO 9.38 L/min, CI 3.53 L/min/m^2.  . Nonischemic cardiomyopathy     a. 01/2015 Echo: EF 20-25%, diff HK, Gr 2 DD, Triv AI, mildly dil LA and Ao root.  [name] Abscess of left groin   . Hyperlipidemia    Past Surgical History  Procedure Laterality Date  . Thoracotomy Left 11/08/2013    Procedure: LEFT THORACOTOMY;  Surgeon: Ivin Poot, MD;  Location: [place];  Service: Thoracic;  Laterality: Left;  . Cardiac catheterization N/A 02/01/2015    Procedure: Right/Left Heart Cath and Coronary Angiography;  Surgeon: Josue Hector, MD;  Location: [facility];  Service: Cardiovascular;  Laterality: N/A;    Allergies  No Known Allergies  HPI   34 year old  African-American male with recent diagnosis of nonischemic cardiomyopathy. He recently underwent diagnostic catheterization revealing normal coronary arteries. Cardiac output and index were normal. He has been compliant with his medications including beta blocker, ACE inhibitor, Lasix, aspirin, and statin therapy. Unfortunately, he does eat out at restaurants and fast foods multiple times per week. In that setting, he has continued to have significant lower extremity edema and his weight is up 9 pounds since the end of May. He does have dyspnea on exertion but does not chest pain, palpitations, PND, orthopnea, dizziness, syncope, or early satiety. His Lasix dose was recently  increased to 60 mg daily and he has not noticed much change in his volume since then.  Home Medications  Prior to Admission medications   Medication Sig Start Date End Date Taking? Authorizing Provider  aspirin EC 81 MG tablet Take 1 tablet (81 mg total) by mouth daily. 01/26/15  Yes Rogelia Mire, NP  atorvastatin (LIPITOR) 80 MG tablet Take 1 tablet (80 mg total) by mouth daily. 02/15/15  Yes Milagros Loll, MD  carvedilol (COREG) 3.125 MG tablet Take 1 tablet (3.125 mg total) by mouth 2 (two) times daily. 02/15/15  Yes Milagros Loll, MD  ezetimibe (ZETIA) 10 MG tablet Take 1 tablet (10 mg total) by mouth daily. 02/21/15  Yes Josue Hector, MD  furosemide (LASIX) 40 MG tablet Take 1.5 tablets (60 mg total) by mouth daily. 02/15/15 02/15/16 Yes Milagros Loll, MD  glipiZIDE (GLUCOTROL) 5 MG tablet Take 1 tablet (5 mg total) by mouth daily. 02/15/15  Yes Milagros Loll, MD  lisinopril (PRINIVIL,ZESTRIL) 10 MG tablet Take 1 tablet (10 mg total) by mouth daily. 02/15/15  Yes Milagros Loll, MD  metFORMIN (GLUCOPHAGE) 1000 MG tablet Take 1 tablet (1,000 mg total) by mouth 2 (two) times daily with a meal. 02/15/15  Yes Milagros Loll, MD  nitroGLYCERIN (NITROSTAT) 0.4 MG SL tablet Place 1 tablet (0.4 mg total) under the tongue every 5 (five) minutes as needed for chest pain. 01/26/15  Yes Rogelia Mire, NP    Review of Systems  he does express dyspnea exertion as well as significant lower extremity edema. He denies PND, orthopnea, dizziness, syncope, chest pain, or early satiety.  All other systems reviewed and are otherwise negative except as noted above.  Physical Exam  VS:  BP 140/98 mmHg  Pulse 82  Ht 6\' 5"  (1.956 m)  Wt 332 lb 6.4 oz (150.776 kg)  BMI 39.41 kg/m2 , BMI Body mass index is 39.41 kg/(m^2). GEN: Well nourished, well developed, in no acute distress. HEENT: normal. Neck: Supple, no carotid bruits, or masses.  JVP difficult to gauge 2/2 girth. Cardiac: RRR, no  murmurs, rubs, or gallops. No clubbing, cyanosis. He has 3+ bilateral lower extremity edema to the knees.  Radials/DP/PT 2+ and equal bilaterally.  Respiratory:  Respirations regular and unlabored, clear to auscultation bilaterally. GI: Soft, nontender, nondistended, BS + x 4. MS: no deformity or atrophy. Skin: warm and dry, no rash. Neuro:  Strength and sensation are intact. Psych: Normal affect.  Accessory Clinical Findings  ECG  Regular sinus rhythm , 82 , inferolateral T-wave inversion -no acute ST-T changes.  Assessment & Plan  1. Nonischemic cardiomyopathy:    EF of 20-25%  With diffuse hypokinesis by echocardiogram in May.  Status post recent catheterization revealing normal coronary arteries and normal cardiac output and index. Unfortunately, his weight is up 9 pounds since I last saw him in clinic. His Lasix dose was increased by his primary care provider last week though he remains only on 60 mg daily. He has significant lower extremity edema on exam and continues to have dyspnea on exertion. I will increase his Lasix to 60 mg twice a day and also add spironolactone 12.5 mg daily. He remains on carvedilol and lisinopril as well.   I will not titrate beta blocker today in light of ongoing volume excess and need for further diuresis.   Although he says he doesn't add salt anything, he goes out to restaurants or eats fast food at least every other day. He discussed the importance of avoiding sodium  And its relationship to fluid retention , volume excess , and potentially pulmonary edema.  I will check a basic metabolic panel today and will try and get him into heart failure clinic for next week. If they do not have availability, he will need to be seen back here on a flex clinic schedule. Ultimately, he will require repeat echocardiography in 90 days to reevaluate LV function and determine candidacy for ICD placement.  2. Chest pain:  No recurrence. Catheterization showed normal coronary  arteries.  3. Essential hypertension:  Blood pressure is elevated though better compared to last time that I saw him. I suspect with further diuresis this may improve. Continue beta blocker, ACE inhibitor, and add spironolactone as well as titrate Lasix.  4. Hyperlipidemia:  Total cholesterol 44 with triglyceride 254, HDL 46, LDL 307 on June 9.He remains on Lipitor 80  And also Zetia. LFTs were normal on April 24th. He is to be evaluated in our lipid clinic for consideration of  PCSK9 inhibitor therapy.  5. Morbid obesity:  Patient is fairly sedentary. I did encourage him to try and get out and walk daily. We also discussed his diet as it is heavily laden with fast food.  6. Type 2 diabetes mellitus: Poorly controlled. A1c was recently 8.9. This is followed by primary care.  7. Drug abuse: Patient smoked marijuana 2-3 times a week. Cessation advised.  8. Disposition: f/u bmet today.  F/u in clinic in  1 wk with repeat bmet @ that time.  Murray Hodgkins, NP 02/22/2015, 3:49 PM

## 2015-02-22 NOTE — Patient Instructions (Addendum)
Medication Instructions:   START TAKING LASIX ONE TABLET AND HALF TWICE A DAY   START TAKING SPRINOLACTONE 12.5 MG (HALF OF 25 MG ) ONCE A DAY   Labwork:  BMET   Testing/Procedures:   Follow-Up:  WITH HEART FAILURE CLINIC IN ONE WEEK IF NOT AVAILABLE  ADD TO FLEX  SCHEDULE IN ONE WEEK   Any Other Special Instructions Will Be Listed Below (If Applicable).

## 2015-03-01 DIAGNOSIS — I5042 Chronic combined systolic (congestive) and diastolic (congestive) heart failure: Secondary | ICD-10-CM | POA: Insufficient documentation

## 2015-03-01 NOTE — Progress Notes (Addendum)
Cardiology Office Note Date:  03/02/2015  Patient ID:  Jeremy [name], DOB [birth year], MRN [id number] PCP:  Charlott Rakes, MD  Cardiologist: P. Nishan   Chief Complaint: follow-up of CHF  History of Present Illness: Jeremy [name] is a 34 y.o. male with history of NICM, essential HTN, hyperlipidemia, morbid obesity, DM, marijuana abuse, likely high salt diet who presents for follow-up. He recently underwent diagnostic catheterization 02/01/15 revealing normal coronary arteries. Cardiac output and index were normal. 2D Echo 01/25/15 showed severe LVH, EF 20-25%, grade 2 DD, mildly dilated aortic root, mild LAE, trace AI, MR, TR. He was seen last week by Ignacia Bayley NP at which time he did report eating out at fast food and restaurants multiple times per week. He reported compliance with his medications including beta blocker, ACE inhibitor, Lasix, aspirin, and statin therapy. His weight was up 9lbs and he was reporting DOE. Lasix was increased to 60mg  BID and spironolactone 12.5mg  daily was added. BMET showed BUN/Cr 14/1.04. LDL recently 307 (!!) by PCP on 02/15/15 who has him on Lipitor 80mg  daily. He was started on Zetia by Elberta Leatherwood in the lipid clinic but he has not yet started this because it is too costly and he is waiting for paperwork to go through on income. Of note UA in 12/2014 did show >300 protein.  He comes in today for follow-up still noting that blood pressures are running 140s-160s/110. Says "I feel alright." He denies any CP, SOB but does endorse leg soreness and general fatigue. He had hot flashes earlier this week but this resolved. Breathing is unlabored. Weight hasn't changed much since last week. His child's mother says that she has been trying to cook for him more and they are decreasing salt intake. He drinks 4 bottles of water a day, but stays within the 2L restriction (64oz/day). Steady UOP reported but not excessive. He still endorses compliance with meds - took them all about  an hour ago.   Past Medical History  Diagnosis Date  . Type II diabetes mellitus     a. 01/2015 HbA1c = 8.9.  . Essential hypertension   . Asthma   . Morbid obesity   . Chest pain     a. 01/2015 Lexiscan MV: EF 28%, inferior, inferolateral, apical ischemia;  b. 01/2015 Cath: nl cors, PCWP 18 mmHg, CO 9.38 L/min, CI 3.53 L/min/m^2.  . Nonischemic cardiomyopathy     a. 01/2015 Echo: EF 20-25%, diff HK, Gr 2 DD, Triv AI, mildly dil LA and Ao root.  [name] Abscess of left groin   . Hyperlipidemia     Past Surgical History  Procedure Laterality Date  . Thoracotomy Left 11/08/2013    Procedure: LEFT THORACOTOMY;  Surgeon: Ivin Poot, MD;  Location: [facility];  Service: Thoracic;  Laterality: Left;  . Cardiac catheterization N/A 02/01/2015    Procedure: Right/Left Heart Cath and Coronary Angiography;  Surgeon: Josue Hector, MD;  Location: [facility];  Service: Cardiovascular;  Laterality: N/A;    Current Outpatient Prescriptions  Medication Sig Dispense Refill  . aspirin EC 81 MG tablet Take 1 tablet (81 mg total) by mouth daily. 30 tablet 0  . atorvastatin (LIPITOR) 80 MG tablet Take 1 tablet (80 mg total) by mouth daily. 90 tablet 2  . carvedilol (COREG) 3.125 MG tablet Take 1 tablet (3.125 mg total) by mouth 2 (two) times daily. 60 tablet 6  . ezetimibe (ZETIA) 10 MG tablet Take 1 tablet (10 mg total) by  mouth daily. 30 tablet 11  . furosemide (LASIX) 40 MG tablet Take 1.5 tablets (60 mg total) by mouth 2 (two) times daily. 90 tablet 3  . glipiZIDE (GLUCOTROL) 5 MG tablet Take 1 tablet (5 mg total) by mouth daily. 30 tablet 3  . lisinopril (PRINIVIL,ZESTRIL) 10 MG tablet Take 1 tablet (10 mg total) by mouth daily. 30 tablet 11  . metFORMIN (GLUCOPHAGE) 1000 MG tablet Take 1 tablet (1,000 mg total) by mouth 2 (two) times daily with a meal. 30 tablet 2  . nitroGLYCERIN (NITROSTAT) 0.4 MG SL tablet Place 1 tablet (0.4 mg total) under the tongue every 5 (five) minutes as needed for chest  pain. 25 tablet 3  . spironolactone (ALDACTONE) 25 MG tablet Take 0.5 tablets (12.5 mg total) by mouth daily. 30 tablet 3   No current facility-administered medications for this visit.    Allergies:   Review of patient's allergies indicates no known allergies.   Social History:  The patient  reports that he has never smoked. He does not have any smokeless tobacco history on file. He reports that he uses illicit drugs (Marijuana). He reports that he does not drink alcohol.   Family History:  The patient's family history includes CAD in his father and sister; Congestive Heart Failure in his father; Diabetes Mellitus II in his mother and sister; Gastric cancer in his mother; Heart attack in his father.  ROS:  Please see the history of present illness. All other systems are reviewed and otherwise negative.   PHYSICAL EXAM:  VS:  BP 148/102 mmHg  Pulse 83  Ht 6\' 5"  (1.956 m)  Wt 331 lb 12.8 oz (150.503 kg)  BMI 39.34 kg/m2 BMI: Body mass index is 39.34 kg/(m^2). Personally rechecked BP at the above value. Originally 161/102. Well nourished, well developed obese AAM in no acute distress HEENT: normocephalic, atraumatic Neck: JVD difficult to assess given baseline neck habitus, carotid bruits or masses Cardiac:  normal S1, S2; RRR; no murmurs, rubs, or gallops Lungs:  clear to auscultation bilaterally, no wheezing, rhonchi or rales Abd: soft, nontender, no hepatomegaly, + BS MS: no deformity or atrophy Ext: 1+ BLE edema superimposed on large baseline leg habitus Skin: warm and dry, no rash Neuro:  moves all extremities spontaneously, no focal abnormalities noted, follows commands Psych: euthymic mood, full affect   Recent Labs: 12/31/2014: ALT 12 01/09/2015: TSH 2.556 01/31/2015: Hemoglobin 13.2; Platelets 299 02/22/2015: BUN 14; Creatinine, Ser 1.04; Potassium 3.8; Sodium 140  02/15/2015: Cholesterol 404*; HDL 46; LDL Cholesterol 307*; Total CHOL/HDL Ratio 8.8; Triglycerides 254*; VLDL 51*     Estimated Creatinine Clearance: 161 mL/min (by C-G formula based on Cr of 1.04).   Wt Readings from Last 3 Encounters:  03/02/15 331 lb 12.8 oz (150.503 kg)  02/22/15 332 lb 6.4 oz (150.776 kg)  02/15/15 331 lb 1.6 oz (150.186 kg)     Other studies reviewed: Additional studies/records reviewed today include: summarized above  ASSESSMENT AND PLAN:  1. 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 He has been gradually changing diet. He doesn't weigh daily because he doesn't have a scale. We discussed importance of this. Will titrate lisinopril for afterload reduction to 20mg  daily, also given proteinuria. Will also increase Lasix to 80mg  BID. If fluid does not start mobilizing I have asked [place] to contact our office before next appointment so we can make changes if needed. Recheck BMET today. Will eventually need to determine timing of f/u  echo to evaluate for ICD candidacy once meds optimized. 2. Essential HTN - remains elevated. Will order 24-hour urine to evaluate for nephrotic syndrome.  I do wonder about possibility of this given HTN, CHF, edema, proteinuria and marked hyperlipidemia. Although titrating his ACEI may affect this result I do think the benefit is worthwhile for better afterload reduction and BP control. It may be worthwhile at some point to pursue sleep study or renal artery duplex if this is unrevealing. 3. Changes as above. Have asked him to call our office in the meantime before next appointment if BP still running >130/80 at which time I would probably titrate his BB. 4. Hyperlipidemia - have asked nursing to touch base with Gay Filler to find out status of paperwork on Zetia. Will give samples if we have them. 5. Obesity Body mass index is 39.34 kg/(m^2). - we discussed longterm importance of weight loss.  Disposition: F/u in flex with either me or Ignacia Bayley, or CHF clinic in 7-10 days.  Current medicines are reviewed at  length with the patient today.  The patient did not have any concerns regarding medicines.  Raechel Ache PA-C 03/02/2015 11:16 AM     [facility] [place] [address] [phone] (office)  [phone] (fax)

## 2015-03-02 ENCOUNTER — Encounter: Payer: Self-pay | Admitting: Physician Assistant

## 2015-03-02 ENCOUNTER — Ambulatory Visit (INDEPENDENT_AMBULATORY_CARE_PROVIDER_SITE_OTHER): Payer: No Typology Code available for payment source | Admitting: Physician Assistant

## 2015-03-02 VITALS — BP 148/102 | HR 83 | Ht 77.0 in | Wt 331.8 lb

## 2015-03-02 DIAGNOSIS — E785 Hyperlipidemia, unspecified: Secondary | ICD-10-CM

## 2015-03-02 DIAGNOSIS — R809 Proteinuria, unspecified: Secondary | ICD-10-CM

## 2015-03-02 DIAGNOSIS — I5043 Acute on chronic combined systolic (congestive) and diastolic (congestive) heart failure: Secondary | ICD-10-CM

## 2015-03-02 DIAGNOSIS — I429 Cardiomyopathy, unspecified: Secondary | ICD-10-CM

## 2015-03-02 DIAGNOSIS — I10 Essential (primary) hypertension: Secondary | ICD-10-CM

## 2015-03-02 DIAGNOSIS — I428 Other cardiomyopathies: Secondary | ICD-10-CM

## 2015-03-02 DIAGNOSIS — E669 Obesity, unspecified: Secondary | ICD-10-CM

## 2015-03-02 LAB — BASIC METABOLIC PANEL
BUN: 11 mg/dL (ref 6–23)
CO2: 31 meq/L (ref 19–32)
Calcium: 8.3 mg/dL — ABNORMAL LOW (ref 8.4–10.5)
Chloride: 107 mEq/L (ref 96–112)
Creatinine, Ser: 1.05 mg/dL (ref 0.40–1.50)
GFR: 103.42 mL/min (ref >60.00)
Glucose, Bld: 112 mg/dL — ABNORMAL HIGH (ref 70–99)
Potassium: 3.6 mEq/L (ref 3.5–5.1)
Sodium: 141 mEq/L (ref 135–145)

## 2015-03-02 MED ORDER — FUROSEMIDE 40 MG PO TABS: 80.0000 mg | ORAL_TABLET | Freq: Two times a day (BID) | ORAL | Status: DC

## 2015-03-02 MED ORDER — LISINOPRIL 20 MG PO TABS: 20.0000 mg | ORAL_TABLET | Freq: Every day | ORAL | Status: DC

## 2015-03-02 NOTE — Patient Instructions (Addendum)
Medication Instructions:   START TAKING LISINOPRIL 20 MG ONCE A DAY   START TAKING LASIX 80 MG TWICE A DAY    Labwork:  BMET    Testing/Procedures:   Follow-Up:     Any Other Special Instructions Will Be Listed Below (If Applicable).  PLEASE CONTACT OFFICE IF BLOOD PRESSURE IS STILL RUNNING 130/ 80'S  WITH MEDICATION CHANGES

## 2015-03-09 ENCOUNTER — Ambulatory Visit (INDEPENDENT_AMBULATORY_CARE_PROVIDER_SITE_OTHER): Payer: No Typology Code available for payment source | Admitting: Nurse Practitioner

## 2015-03-09 ENCOUNTER — Encounter: Payer: Self-pay | Admitting: Nurse Practitioner

## 2015-03-09 VITALS — BP 136/88 | HR 78 | Ht 77.0 in | Wt 318.0 lb

## 2015-03-09 DIAGNOSIS — I429 Cardiomyopathy, unspecified: Secondary | ICD-10-CM

## 2015-03-09 DIAGNOSIS — I10 Essential (primary) hypertension: Secondary | ICD-10-CM

## 2015-03-09 DIAGNOSIS — E785 Hyperlipidemia, unspecified: Secondary | ICD-10-CM

## 2015-03-09 DIAGNOSIS — I428 Other cardiomyopathies: Secondary | ICD-10-CM

## 2015-03-09 LAB — BASIC METABOLIC PANEL
BUN: 15 mg/dL (ref 6–23)
CALCIUM: 8.5 mg/dL (ref 8.4–10.5)
CO2: 31 meq/L (ref 19–32)
CREATININE: 1 mg/dL (ref 0.40–1.50)
Chloride: 103 mEq/L (ref 96–112)
GFR: 109.39 mL/min (ref >60.00)
Glucose, Bld: 122 mg/dL — ABNORMAL HIGH (ref 70–99)
POTASSIUM: 3.5 meq/L (ref 3.5–5.1)
SODIUM: 139 meq/L (ref 135–145)

## 2015-03-09 MED ORDER — SPIRONOLACTONE 25 MG PO TABS: 25.0000 mg | ORAL_TABLET | Freq: Every day | ORAL | Status: DC

## 2015-03-09 MED ORDER — CARVEDILOL 6.25 MG PO TABS: 3.1250 mg | ORAL_TABLET | Freq: Two times a day (BID) | ORAL | Status: DC

## 2015-03-09 NOTE — Progress Notes (Signed)
Chief Complaint  Patient presents with  . Hyperlipidemia      History of Present Illness: Jeremy [name] is a 34 y.o. male patient of Dr. 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  His PMH is significant for HTN, DM, and nonischemic cardiomyopathy.  He had a cath in May that showed normal coronaries.  He was not taking any cholesterol medications prior to his hospital stay in March but his OV notes between March and May list he was taking pravstatin 20mg  then switched to atorvastatin 80mg .  No details on who or when the changes occurred.  At today's visit he is only taking atorvastatin 80mg  daily for his cholesterol.  He states it was changed from Crestor due to cost.  He is currently uninsured and uses the [facility] for his medications.   Pt has a very strong family history of hyperlipidemia.  His father had an MI at 54 and his sister has a history of CAD at the age of 55.    Pt does not have any evidence of tendon xanthomas or corneal arcus on physical exam.    Pt states he tries to watch his diet some due to his diabetes.  He likes to eat fruit and drink water but admits to fast food or junk food at least every other day.  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  His only exercise is walking the dog occasionally.  He is married and has a 42 year old son.   RF: DM, Family history (Dutch Score- 6)  LDL goal <70, non-HDL <100 Current Therapy: Lipitor 80mg  daily Intolerances: None  Labs:  02/2015: TC 404, TG 254, HDL 46, LDL 307 (lipitor 80mg  daily)     Past Medical History  Diagnosis Date  . Type II diabetes mellitus     a. 01/2015 HbA1c = 8.9.  . Essential hypertension   . Asthma   . Morbid obesity   . Chest pain     a. 01/2015 Lexiscan MV: EF 28%, inferior, inferolateral, apical ischemia;  b. 01/2015 Cath: nl cors, PCWP 18 mmHg, CO 9.38 L/min, CI 3.53 L/min/m^2.  . Nonischemic cardiomyopathy     a. 01/2015 Echo: EF 20-25%, diff HK, Gr  2 DD, Triv AI, mildly dil LA and Ao root.  [name] Abscess of left groin   . Hyperlipidemia      Current Outpatient Prescriptions  Medication Sig Dispense Refill  . aspirin EC 81 MG tablet Take 1 tablet (81 mg total) by mouth daily. 30 tablet 0  . atorvastatin (LIPITOR) 80 MG tablet Take 1 tablet (80 mg total) by mouth daily. 90 tablet 2  . carvedilol (COREG) 3.125 MG tablet Take 1 tablet (3.125 mg total) by mouth 2 (two) times daily. 60 tablet 6  . ezetimibe (ZETIA) 10 MG tablet Take 1 tablet (10 mg total) by mouth daily. 30 tablet 11  . furosemide (LASIX) 40 MG tablet Take 2 tablets (80 mg total) by mouth 2 (two) times daily. 90 tablet 3  . glipiZIDE (GLUCOTROL) 5 MG tablet Take 1 tablet (5 mg total) by mouth daily. 30 tablet 3  . lisinopril (PRINIVIL,ZESTRIL) 20 MG tablet Take 1 tablet (20 mg total) by mouth daily. 30 tablet 11  . metFORMIN (GLUCOPHAGE) 1000 MG tablet Take 1 tablet (1,000 mg total) by mouth 2 (two) times daily with a meal. 30 tablet 2  . nitroGLYCERIN (NITROSTAT) 0.4 MG SL tablet Place 1 tablet (0.4 mg total) under the  tongue every 5 (five) minutes as needed for chest pain. 25 tablet 3  . spironolactone (ALDACTONE) 25 MG tablet Take 0.5 tablets (12.5 mg total) by mouth daily. 30 tablet 3   No current facility-administered medications for this visit.    Allergies:   Review of patient's allergies indicates no known allergies.    Social History:  The patient  reports that he has never smoked. He does not have any smokeless tobacco history on file. He reports that he uses illicit drugs (Marijuana). He reports that he does not drink alcohol.   Family History:  The patient's family history includes CAD in his father and sister; Congestive Heart Failure in his father; Diabetes Mellitus II in his mother and sister; Gastric cancer in his mother; Heart attack in his father.     ASSESSMENT AND PLAN:  1.  Hyperlipidemia- Pt's LDL still extremely elevated after taking Lipitor 80mg   for several weeks.  Unfortunately he cannot get Crestor from his pharmacy at this time.  Will add zetia 10mg  daily.  Will have him bring in his financial information so we can get him enrolled in the patient assistance program with [employer].  Once he has been on the combination for ~3 months, will recheck labs.  May consider PCSK-9 in the future if we do not achieve a 50% reduction with atorvastatin/Zetia combo.   Signed, Aris Georgia, [address], [place], [place]  [address] Phone: [phone]; Fax: [phone]

## 2015-03-09 NOTE — Progress Notes (Signed)
Patient Name: Jeremy [name] Date of Encounter: 03/09/2015  Primary Care Provider:  Charlott Rakes, MD Primary Cardiologist:  P. Johnsie Cancel, MD - pending f/u in CHF clinic  Chief Complaint  34 year old [place] American male with a history of nonischemic cardiomyopathy who presents for follow-up.  Past Medical History   Past Medical History  Diagnosis Date  . Type II diabetes mellitus     a. 01/2015 HbA1c = 8.9.  . Essential hypertension   . Asthma   . Morbid obesity   . Chest pain     a. 01/2015 Lexiscan MV: EF 28%, inferior, inferolateral, apical ischemia;  b. 01/2015 Cath: nl cors, PCWP 18 mmHg, CO 9.38 L/min, CI 3.53 L/min/m^2.  . Nonischemic cardiomyopathy     a. 01/2015 Echo: EF 20-25%, diff HK, Gr 2 DD, Triv AI, mildly dil LA and Ao root.  [name] Abscess of left groin   . Hyperlipidemia    Past Surgical History  Procedure Laterality Date  . Thoracotomy Left 11/08/2013    Procedure: LEFT THORACOTOMY;  Surgeon: Ivin Poot, MD;  Location: [facility];  Service: Thoracic;  Laterality: Left;  . Cardiac catheterization N/A 02/01/2015    Procedure: Right/Left Heart Cath and Coronary Angiography;  Surgeon: Josue Hector, MD;  Location: [facility];  Service: Cardiovascular;  Laterality: N/A;    Allergies  No Known Allergies  HPI  34 year old male with a recent diagnosis of nonischemic cardiomyopathy status post catheterization in May revealing normal coronary arteries. His EF is 20-25%. He has been followed closely in clinic with ongoing titration of his beta blocker, ACE inhibitor, and spironolactone. Up to this point, he has had significant issues with lower extremity swelling and weight gain in the setting of dietary noncompliance and frequent intake of fast food. He was last seen about a week ago at which point, Lasix therapy was increased to 80 mg twice a day. Since then, he has lost 14 pounds. His girlfriend has been cooking their meals and he has been trying to avoid fast food  though it doesn't appear he is given it up completely. In the setting of weight loss, he has been feeling better and has less lower extremity edema. He denies PND, orthopnea, dizziness, syncope, chest pain, or early satiety. He reports compliance with all of his medications.  Home Medications  Prior to Admission medications   Medication Sig Start Date End Date Taking? Authorizing Provider  aspirin EC 81 MG tablet Take 1 tablet (81 mg total) by mouth daily. 01/26/15  Yes Rogelia Mire, NP  atorvastatin (LIPITOR) 80 MG tablet Take 1 tablet (80 mg total) by mouth daily. 02/15/15  Yes Milagros Loll, MD  carvedilol (COREG) 6.25 MG tablet Take 0.5 tablets (3.125 mg total) by mouth 2 (two) times daily with a meal. 03/09/15  Yes Rogelia Mire, NP  ezetimibe (ZETIA) 10 MG tablet Take 1 tablet (10 mg total) by mouth daily. 02/21/15  Yes Josue Hector, MD  furosemide (LASIX) 40 MG tablet Take 2 tablets (80 mg total) by mouth 2 (two) times daily. 03/02/15 03/01/16 Yes Dayna N Dunn, PA-C  glipiZIDE (GLUCOTROL) 5 MG tablet Take 1 tablet (5 mg total) by mouth daily. 02/15/15  Yes Milagros Loll, MD  lisinopril (PRINIVIL,ZESTRIL) 20 MG tablet Take 1 tablet (20 mg total) by mouth daily. 03/02/15  Yes Dayna N Dunn, PA-C  metFORMIN (GLUCOPHAGE) 1000 MG tablet Take 1 tablet (1,000 mg total) by mouth 2 (two) times daily with a  meal. 02/15/15  Yes Milagros Loll, MD  nitroGLYCERIN (NITROSTAT) 0.4 MG SL tablet Place 1 tablet (0.4 mg total) under the tongue every 5 (five) minutes as needed for chest pain. 01/26/15  Yes Rogelia Mire, NP  spironolactone (ALDACTONE) 25 MG tablet Take 1 tablet (25 mg total) by mouth daily. 03/09/15  Yes Rogelia Mire, NP    Review of Systems  As above, doing much better. He denies chest pain, palpitations, PND, orthopnea, dizziness, syncope, or early satiety. He does continue to have mild lower extremity edema and also dyspnea with higher levels of exertion. All other  systems reviewed and are otherwise negative except as noted above.  Physical Exam  VS:  BP 136/88 mmHg  Pulse 78  Ht 6\' 5"  (1.956 m)  Wt 318 lb (144.244 kg)  BMI 37.70 kg/m2 , BMI Body mass index is 37.7 kg/(m^2). GEN: Well nourished, well developed, in no acute distress. HEENT: normal. Neck: Supple, no JVD, carotid bruits, or masses. Cardiac: RRR, no murmurs, rubs, or gallops. No clubbing, cyanosis, 1+ bilateral lower extremity edema.  Radials/DP/PT 2+ and equal bilaterally.  Respiratory:  Respirations regular and unlabored, clear to auscultation bilaterally. GI: Soft, nontender, nondistended, BS + x 4. MS: no deformity or atrophy. Skin: warm and dry, no rash. Neuro:  Strength and sensation are intact. Psych: Normal affect.  Accessory Clinical Findings  ECG - regular sinus rhythm, 78, left axis, lateral T-wave inversion. No acute ST or T changes.  Assessment & Plan  1.  Nonischemic cardiopathy/chronic systolic congestive heart failure: Patient's dietary compliance has improved and that along with a higher dose of Lasix has resulted in improved edema, reduced dyspnea, and a 14 pound weight loss over the past week. I will follow-up a basic metabolic panel today. I'll continue Lasix at the current dose and have asked him to start taking 25 mg of spironolactone. I will also titrate carvedilol to 6.25 mg twice a day. He is also supposed to have a 24-hour urine for proteinuria though it does not appear that this has been carried out. I will order. He is scheduled to be seen in heart failure clinic on July 13, although we could see him here sooner if that becomes necessary.  2. Essential hypertension: Blood pressure is much better overall. I will titrate his beta blocker further and continue ACE inhibitor and spironolactone.  3. Hyperlipidemia: He has an LDL of 307 and remains on high potency Lipitor and also Zetia. He is being followed in our lipid clinic.  4. Morbid obesity: Patient is  trying to get out and walk more and realizes that he needs to lose weight. He and his girlfriend have cut fast food in their diet.  5. Type 2 diabetes mellitus: A1c was 8.9 earlier this year. This is followed by primary care and he remains on metformin therapy.  6.: Patient has a history of smoking marijuana 2-3 times per week. Complete cessation advised.  7. Disposition: Follow-up basic metabolic panel and [id number] urine for proteinuria. He has follow-up heart failure clinic on July 13.  Murray Hodgkins, NP 03/09/2015, 4:02 PM

## 2015-03-09 NOTE — Patient Instructions (Addendum)
Medication Instructions:  Your physician has recommended you make the following change in your medication:  1- Increase Carvedilol 6.25 mg by mouth twice daily. 2-Increase Spirolactone 25 mg by mouth daily.  Labwork: Your physician recommends that you have lab work today. BMET  Testing/Procedures: NONE  Follow-Up: Your physician recommends that you follow up with your already scheduled appointment with CHF clinic.   Any Other Special Instructions Will Be Listed Below (If Applicable).

## 2015-03-15 ENCOUNTER — Telehealth: Payer: Self-pay | Admitting: Cardiovascular Disease

## 2015-03-15 NOTE — Telephone Encounter (Signed)
Follow Up ° ° ° ° ° ° °Pt returning Paula's phone call.  °

## 2015-03-16 MED ORDER — POTASSIUM CHLORIDE CRYS ER 20 MEQ PO TBCR: 20.0000 meq | EXTENDED_RELEASE_TABLET | Freq: Every day | ORAL | Status: DC

## 2015-03-16 NOTE — Telephone Encounter (Signed)
Patient notified of lab results and notation from Ignacia Bayley, NP - "K is low @ 3.5 despite being on spironolactone. Please add kdur 20 meq daily. He'll see CHF clinic on 7/13 and they will likely repeat labs @ that point. OTW renal fxn looks good.". Patient verbalized understanding and agreement to continue with current treatment plan and addition of potassium supplement. Prefers Rx to be sent to [facility] on [facility]. Completed. Discussed potassium supplement potential side effects and directions on how to take it. Patient verbalized understanding. Has no further questions or concerns.

## 2015-03-21 ENCOUNTER — Ambulatory Visit (HOSPITAL_COMMUNITY)
Admission: RE | Admit: 2015-03-21 | Discharge: 2015-03-21 | Disposition: A | Discharge status: Home / Self Care | Payer: No Typology Code available for payment source | Source: Ambulatory Visit | Attending: Cardiology | Admitting: Cardiology

## 2015-03-21 ENCOUNTER — Encounter (HOSPITAL_COMMUNITY): Payer: Self-pay

## 2015-03-21 VITALS — BP 144/84 | HR 82 | Ht 77.0 in | Wt 323.8 lb

## 2015-03-21 DIAGNOSIS — Z79899 Other long term (current) drug therapy: Secondary | ICD-10-CM | POA: Insufficient documentation

## 2015-03-21 DIAGNOSIS — R0683 Snoring: Secondary | ICD-10-CM

## 2015-03-21 DIAGNOSIS — E119 Type 2 diabetes mellitus without complications: Secondary | ICD-10-CM | POA: Insufficient documentation

## 2015-03-21 DIAGNOSIS — Z7982 Long term (current) use of aspirin: Secondary | ICD-10-CM | POA: Insufficient documentation

## 2015-03-21 DIAGNOSIS — I10 Essential (primary) hypertension: Secondary | ICD-10-CM

## 2015-03-21 DIAGNOSIS — I5022 Chronic systolic (congestive) heart failure: Secondary | ICD-10-CM | POA: Insufficient documentation

## 2015-03-21 DIAGNOSIS — J45909 Unspecified asthma, uncomplicated: Secondary | ICD-10-CM | POA: Insufficient documentation

## 2015-03-21 DIAGNOSIS — I739 Peripheral vascular disease, unspecified: Secondary | ICD-10-CM | POA: Insufficient documentation

## 2015-03-21 DIAGNOSIS — I5042 Chronic combined systolic (congestive) and diastolic (congestive) heart failure: Secondary | ICD-10-CM

## 2015-03-21 DIAGNOSIS — E785 Hyperlipidemia, unspecified: Secondary | ICD-10-CM | POA: Insufficient documentation

## 2015-03-21 DIAGNOSIS — I429 Cardiomyopathy, unspecified: Secondary | ICD-10-CM | POA: Insufficient documentation

## 2015-03-21 MED ORDER — TORSEMIDE 20 MG PO TABS: 80.0000 mg | ORAL_TABLET | Freq: Every day | ORAL | Status: DC

## 2015-03-21 MED ORDER — ISOSORB DINITRATE-HYDRALAZINE 20-37.5 MG PO TABS: 1.0000 | ORAL_TABLET | Freq: Three times a day (TID) | ORAL | Status: DC

## 2015-03-21 NOTE — Patient Instructions (Addendum)
Stop Furosemide  Start Torsemide to 80 mg (4 tabs) daily  Start Bidil 1 tab Three times a day   Your physician has recommended that you have a sleep study. This test records several body functions during sleep, including: brain activity, eye movement, oxygen and carbon dioxide blood levels, heart rate and rhythm, breathing rate and rhythm, the flow of air through your mouth and nose, snoring, body muscle movements, and chest and belly movement.  Your physician has requested that you have an ankle brachial index (ABI). During this test an ultrasound and blood pressure cuff are used to evaluate the arteries that supply the arms and legs with blood. Allow thirty minutes for this exam. There are no restrictions or special instructions.  Your physician recommends that you schedule a follow-up appointment in: 2 weeks

## 2015-03-22 ENCOUNTER — Encounter (HOSPITAL_COMMUNITY): Payer: No Typology Code available for payment source

## 2015-03-22 NOTE — Progress Notes (Signed)
Patient ID: Jeremy [name], male   DOB: Jul 22, 1981, 34 y.o.   MRN: [id number] PCP: Dr. Randell Patient Primary cardiologist: Dr. Johnsie Cancel  34 yo with history of nonischemic cardiomyopathy, HTN, and DM presents for cardiology followup.  In 4/16, patient was seen in the ER with edema and chest pain.  The only medication he was taking was metformin.  BP was elevated.  Echo showed EF 20-25%, and Cardiolite suggested ischemia.  He had RHC/LHC in 5/16.  This showed no coronary disease and mildly elevated filling pressures.    Since then, he has been stable.  He is short of breath after walking 100 yards or walking up steps.  No orthopnea or PND . Rare atypical chest pain.  He does report pain in his calves with walking.  The distance he can go without this pain varies.  Today, BP is mildly elevated.  He has taken all his meds. He is sleepy/fatigued during the day.   ECG: NSR, lateral and anterolateral T wave inversions.  Labs (6/16): LDL 308, HDL 46, TGs 284 Labs (7/16): K 3.5, creatinine 1.0  PMH: 1. Type II diabetes since 2006.  2. HTN since around 2010.   3. Asthma 4. Hyperlipidemia 5. Morbid obesity 6. Cardiomyopathy: Nonischemic.  Echo (5/16) with EF 20-25%, diffuse hypokinesis, grade II diastolic dysfunction.  Lexiscan Cardiolite (5/16) with EF 28%, inferior/inferolateral/apical ischemia.  RHC/LHC (5/16) with normal coronaries, mean RA 11, PA 33/21 mean 28, mean PCWP 18, CI 2.53.   SH: Lives with girlfriend, nonsmoker, no cocaine, occasional marijuana, no ETOH.  Works as Art gallery manager.  FH: Father with CHF, died at 68.  Sister with CHF, PAD.   ROS: All systems reviewed and negative except as per HPI.    Current Outpatient Prescriptions  Medication Sig Dispense Refill  . aspirin EC 81 MG tablet Take 1 tablet (81 mg total) by mouth daily. 30 tablet 0  . atorvastatin (LIPITOR) 80 MG tablet Take 1 tablet (80 mg total) by mouth daily. 90 tablet 2  . carvedilol (COREG) 6.25 MG tablet Take 6.25 mg by mouth 2  (two) times daily with a meal.    . ezetimibe (ZETIA) 10 MG tablet Take 1 tablet (10 mg total) by mouth daily. 30 tablet 11  . glipiZIDE (GLUCOTROL) 5 MG tablet Take 1 tablet (5 mg total) by mouth daily. 30 tablet 3  . lisinopril (PRINIVIL,ZESTRIL) 20 MG tablet Take 1 tablet (20 mg total) by mouth daily. 30 tablet 11  . metFORMIN (GLUCOPHAGE) 1000 MG tablet Take 1 tablet (1,000 mg total) by mouth 2 (two) times daily with a meal. 30 tablet 2  . nitroGLYCERIN (NITROSTAT) 0.4 MG SL tablet Place 1 tablet (0.4 mg total) under the tongue every 5 (five) minutes as needed for chest pain. 25 tablet 3  . potassium chloride SA (K-DUR,KLOR-CON) 20 MEQ tablet Take 1 tablet (20 mEq total) by mouth daily. 90 tablet 3  . spironolactone (ALDACTONE) 25 MG tablet Take 1 tablet (25 mg total) by mouth daily. 90 tablet 3  . isosorbide-hydrALAZINE (BIDIL) 20-37.5 MG per tablet Take 1 tablet by mouth 3 (three) times daily. 90 tablet 3  . torsemide (DEMADEX) 20 MG tablet Take 4 tablets (80 mg total) by mouth daily. 120 tablet 3   No current facility-administered medications for this encounter.   BP 144/84 mmHg  Pulse 82  Ht 6\' 5"  (1.956 m)  Wt 323 lb 12.8 oz (146.875 kg)  BMI 38.39 kg/m2  SpO2 98% General: NAD Neck: Thick, JVP 8-9 cm,  no thyromegaly or thyroid nodule.  Lungs: Clear to auscultation bilaterally with normal respiratory effort. CV: Nondisplaced PMI.  Heart regular S1/S2, no S3/S4, no murmur.  1+ edema to knees bilaterally.  No carotid bruit.  Unable to palpate PT pulses.  Abdomen: Soft, nontender, no hepatosplenomegaly, no distention.  Skin: Intact without lesions or rashes.  Neurologic: Alert and oriented x 3.  Psych: Normal affect. Extremities: No clubbing or cyanosis.  HEENT: Normal.   Assessment/Plan:  1. Chronic systolic CHF: Nonischemic cardiomyopathy. ?from long-standing HTN.  On exam today, he is volume overloaded with NYHA class II-III symptoms.   - I will stop Lasix 80 mg bid and start  him on torsemide 80 mg daily.   - Continue current Coreg, lisinopril, and spironolactone.  - Add Bidil 1 tab tid.  - Followup in 2 wks with BMET.  - He will need repeat echo for ICD in 11/16.  2. Suspected OSA: Sleepy/fatigued during day.  I will arrange for sleep study . 3. Claudication: I am unable to feel PT pulses, possible calf claudication.  I will arrange for ABIs.  4. Hyperlipidemia: Possibly familial, LDL was very high in 6/16 (he says he had only been on atorvastatin for a short period when labs were drawn).  He is now on atorvastatin 80 daily but was unable to afford Zetia so has not tried it.  Repeat lipids in 1 month and will refer to lipid clinic (?can we get financial aid for him to get Zetia).  He may end up needing Praluent.  5. HTN: BP high.  As above, adding Bidil.  Loralie Champagne 03/22/2015

## 2015-03-23 ENCOUNTER — Telehealth (HOSPITAL_COMMUNITY): Payer: Self-pay | Admitting: Cardiology

## 2015-03-23 NOTE — Telephone Encounter (Signed)
Per Dr. Aundra Dubin pt needs to have ABI's cpt code [phone], sleep study cpt code [phone] With pts current insurance- GCCn [id number] No pre cert required

## 2015-03-26 DIAGNOSIS — Z0271 Encounter for disability determination: Secondary | ICD-10-CM

## 2015-03-27 ENCOUNTER — Encounter (HOSPITAL_COMMUNITY): Payer: No Typology Code available for payment source

## 2015-04-04 ENCOUNTER — Inpatient Hospital Stay (HOSPITAL_COMMUNITY): Admission: RE | Admit: 2015-04-04 | Payer: No Typology Code available for payment source | Source: Ambulatory Visit

## 2015-04-04 ENCOUNTER — Ambulatory Visit (HOSPITAL_COMMUNITY): Payer: No Typology Code available for payment source | Attending: Cardiology

## 2015-04-13 ENCOUNTER — Other Ambulatory Visit: Payer: Self-pay | Admitting: Cardiology

## 2015-04-13 DIAGNOSIS — I739 Peripheral vascular disease, unspecified: Secondary | ICD-10-CM

## 2015-04-18 ENCOUNTER — Ambulatory Visit (HOSPITAL_COMMUNITY)
Admission: RE | Admit: 2015-04-18 | Discharge: 2015-04-18 | Disposition: A | Discharge status: Home / Self Care | Payer: No Typology Code available for payment source | Source: Ambulatory Visit | Attending: Cardiology | Admitting: Cardiology

## 2015-04-18 DIAGNOSIS — I739 Peripheral vascular disease, unspecified: Secondary | ICD-10-CM | POA: Insufficient documentation

## 2015-04-20 ENCOUNTER — Other Ambulatory Visit (HOSPITAL_COMMUNITY): Payer: Self-pay | Admitting: Cardiology

## 2015-04-20 DIAGNOSIS — I5020 Unspecified systolic (congestive) heart failure: Secondary | ICD-10-CM

## 2015-04-20 MED ORDER — CARVEDILOL 6.25 MG PO TABS: 6.2500 mg | ORAL_TABLET | Freq: Two times a day (BID) | ORAL | Status: DC

## 2015-04-20 NOTE — Telephone Encounter (Signed)
Pt called to request refills on coreg as he was just out of town and he left his bottle at the hotel  New Rx sent into pharmacy

## 2015-04-24 ENCOUNTER — Encounter (HOSPITAL_COMMUNITY): Payer: Self-pay

## 2015-04-24 NOTE — Progress Notes (Signed)
[facility] sent medical record request for 09/08/2013 - present. Patient's initial and only encounter by our office/providers 03/22/2015 (notation made of this for their records on return fax). All available records faxed to provided # [phone]. Copy of record request scanned into electronic medical records.  Renee Pain

## 2015-05-01 ENCOUNTER — Ambulatory Visit (HOSPITAL_COMMUNITY)
Admission: RE | Admit: 2015-05-01 | Discharge: 2015-05-01 | Disposition: A | Discharge status: Home / Self Care | Payer: No Typology Code available for payment source | Source: Ambulatory Visit | Attending: Cardiology | Admitting: Cardiology

## 2015-05-01 VITALS — BP 140/82 | HR 81 | Wt 355.5 lb

## 2015-05-01 DIAGNOSIS — Z7982 Long term (current) use of aspirin: Secondary | ICD-10-CM | POA: Insufficient documentation

## 2015-05-01 DIAGNOSIS — E119 Type 2 diabetes mellitus without complications: Secondary | ICD-10-CM | POA: Insufficient documentation

## 2015-05-01 DIAGNOSIS — I5042 Chronic combined systolic (congestive) and diastolic (congestive) heart failure: Secondary | ICD-10-CM

## 2015-05-01 DIAGNOSIS — I5022 Chronic systolic (congestive) heart failure: Secondary | ICD-10-CM | POA: Insufficient documentation

## 2015-05-01 DIAGNOSIS — E785 Hyperlipidemia, unspecified: Secondary | ICD-10-CM | POA: Insufficient documentation

## 2015-05-01 DIAGNOSIS — Z79899 Other long term (current) drug therapy: Secondary | ICD-10-CM | POA: Insufficient documentation

## 2015-05-01 DIAGNOSIS — Z8249 Family history of ischemic heart disease and other diseases of the circulatory system: Secondary | ICD-10-CM | POA: Insufficient documentation

## 2015-05-01 DIAGNOSIS — I10 Essential (primary) hypertension: Secondary | ICD-10-CM | POA: Insufficient documentation

## 2015-05-01 DIAGNOSIS — I428 Other cardiomyopathies: Secondary | ICD-10-CM | POA: Insufficient documentation

## 2015-05-01 LAB — BASIC METABOLIC PANEL
Anion gap: 5 (ref 5–15)
BUN: 11 mg/dL (ref 6–20)
CALCIUM: 7.8 mg/dL — AB (ref 8.9–10.3)
CHLORIDE: 107 mmol/L (ref 101–111)
CO2: 27 mmol/L (ref 22–32)
CREATININE: 1.24 mg/dL (ref 0.61–1.24)
GFR calc non Af Amer: >60 mL/min (ref >60)
Glucose, Bld: 179 mg/dL — ABNORMAL HIGH (ref 65–99)
Potassium: 3.9 mmol/L (ref 3.5–5.1)
Sodium: 139 mmol/L (ref 135–145)

## 2015-05-01 LAB — LIPID PANEL
Cholesterol: 458 mg/dL — ABNORMAL HIGH (ref 0–200)
HDL: 41 mg/dL (ref >40)
LDL Cholesterol: 366 mg/dL — ABNORMAL HIGH (ref 0–99)
TRIGLYCERIDES: 254 mg/dL — AB (ref <150)
Total CHOL/HDL Ratio: 11.2 RATIO
VLDL: 51 mg/dL — AB (ref 0–40)

## 2015-05-01 MED ORDER — TORSEMIDE 20 MG PO TABS: 80.0000 mg | ORAL_TABLET | Freq: Every day | ORAL | Status: DC

## 2015-05-01 NOTE — Progress Notes (Signed)
Patient ID: Jeremy [name], male   DOB: Jan 09, 1981, 34 y.o.   MRN: [id number]  PCP: Dr. Randell Patient Primary cardiologist: Dr. Johnsie Cancel  34 yo with history of nonischemic cardiomyopathy, HTN, and DM presents for cardiology followup.  In 4/16, patient was seen in the ER with edema and chest pain.  The only medication he was taking was metformin.  BP was elevated.  Echo showed EF 20-25%, and Cardiolite suggested ischemia.  He had RHC/LHC in 5/16.  This showed no coronary disease and mildly elevated filling pressures.    He presents today for HF follow up.  At last visit we switched him from lasix to torsemide. We also added Bidil. He thought the Bidil was replacing his lasix, so has not been taking any diuretic. His weight shows up 32 lbs from his last visit. Feels full of fluid.  States his breathing isn't very.  Can't quite walk 100 yards without SOB.  Mild orthopnea. No PND. He also has mild swelling of his scrotum. Continues to have rare atypical chest pain.  He continues to have pain in his calves with walking, he states he had ABIs approx 2 weeks and said everything was normal.  Today, BP is mildly elevated.  He has taken all his meds, apart from diuretics. He continues to be sleepy/fatigued during the day.   Labs (6/16): LDL 308, HDL 46, TGs 284 Labs (7/16): K 3.5, creatinine 1.0  PMH: 1. Type II diabetes since 2006.  2. HTN since around 2010.   3. Asthma 4. Hyperlipidemia 5. Morbid obesity 6. Cardiomyopathy: Nonischemic.  Echo (5/16) with EF 20-25%, diffuse hypokinesis, grade II diastolic dysfunction.  Lexiscan Cardiolite (5/16) with EF 28%, inferior/inferolateral/apical ischemia.  RHC/LHC (5/16) with normal coronaries, mean RA 11, PA 33/21 mean 28, mean PCWP 18, CI 2.53.  7. ABIs normal 8/16.   SH: Lives with girlfriend, nonsmoker, no cocaine, occasional marijuana, no ETOH.  Works as Art gallery manager.  FH: Father with CHF, died at 35.  Sister with CHF, PAD.   ROS: All systems reviewed and negative except  as per HPI.    Current Outpatient Prescriptions  Medication Sig Dispense Refill  . aspirin EC 81 MG tablet Take 1 tablet (81 mg total) by mouth daily. 30 tablet 0  . atorvastatin (LIPITOR) 80 MG tablet Take 1 tablet (80 mg total) by mouth daily. 90 tablet 2  . carvedilol (COREG) 6.25 MG tablet Take 1 tablet (6.25 mg total) by mouth 2 (two) times daily with a meal. 60 tablet 3  . glipiZIDE (GLUCOTROL) 5 MG tablet Take 1 tablet (5 mg total) by mouth daily. 30 tablet 3  . isosorbide-hydrALAZINE (BIDIL) 20-37.5 MG per tablet Take 1 tablet by mouth 3 (three) times daily. 90 tablet 3  . lisinopril (PRINIVIL,ZESTRIL) 20 MG tablet Take 1 tablet (20 mg total) by mouth daily. 30 tablet 11  . metFORMIN (GLUCOPHAGE) 1000 MG tablet Take 1 tablet (1,000 mg total) by mouth 2 (two) times daily with a meal. 30 tablet 2  . nitroGLYCERIN (NITROSTAT) 0.4 MG SL tablet Place 1 tablet (0.4 mg total) under the tongue every 5 (five) minutes as needed for chest pain. 25 tablet 3  . potassium chloride SA (K-DUR,KLOR-CON) 20 MEQ tablet Take 1 tablet (20 mEq total) by mouth daily. 90 tablet 3  . spironolactone (ALDACTONE) 25 MG tablet Take 1 tablet (25 mg total) by mouth daily. 90 tablet 3   No current facility-administered medications for this encounter.   BP 140/82 mmHg  Pulse 81  Wt 355 lb 8 oz (161.254 kg)  SpO2 100%  General: NAD Neck: Thick, JVP to jaw cm, no thyromegaly or thyroid nodule noted Lungs: Clear to auscultation bilaterally with normal respiratory effort. CV: Nondisplaced PMI.  Heart regular S1/S2, no S3/S4, no murmur. No carotid bruit.   Abdomen: Soft, nontender, no hepatosplenomegaly, mild distention.  Skin: Intact without lesions or rashes.  Neurologic: Alert and oriented x 3.  Psych: Normal affect. Extremities: No clubbing or cyanosis.  2+ woody edema to knees. Unable to palpate PT pulses.  HEENT: Normal.   Assessment/Plan:  1. Chronic systolic CHF: Nonischemic cardiomyopathy. ?from  long-standing HTN.  On exam today, he is significantly volume overloaded with NYHA class II-III symptoms.   - Start him on torsemide 80 mg daily.  If he is unable to get this with his [employer], will have him take Lasix 120 mg bid.  - Continue current Coreg, lisinopril, and spironolactone.  - Continue Bidil 1 tab tid.  - He will need repeat echo for ICD in 11/16.  2. Suspected OSA: Sleepy/fatigued during day.  Sleep study scheduled end of September. 3. Hyperlipidemia: Possibly familial, LDL was very high in 6/16 (he says he had only been on atorvastatin for a short period when labs were drawn).  He is now on atorvastatin 80 daily but was unable to afford Zetia so has not tried it.  Repeat lipids today   He may end up needing Praluent.  4. HTN: BP better today.  BMET and lipids today. Follow up 2 weeks with BMET.  Shirley Friar PA-C 05/01/2015   Patient seen with PA, agree with the above note.  Unfortunately there was some confusion about meds.  He is taking Bidil but stopped Lasix and never started torsemide.  We went over his meds extensively with him today.  He is volume overloaded on exam but symptomatically does not feel much different.  He will start on torsemide 80 mg daily.  If he has trouble getting this with his [employer], he will take Lasix 120 mg bid.   Loralie Champagne 05/02/2015

## 2015-05-01 NOTE — Patient Instructions (Signed)
Start Torsemide 80 mg (4 tabs) daily,  Labs today  Your physician recommends that you schedule a follow-up appointment in: 2 weeks

## 2015-05-09 ENCOUNTER — Other Ambulatory Visit: Payer: Self-pay | Admitting: Internal Medicine

## 2015-05-09 DIAGNOSIS — E119 Type 2 diabetes mellitus without complications: Secondary | ICD-10-CM

## 2015-05-09 MED ORDER — METFORMIN HCL 1000 MG PO TABS: 1000.0000 mg | ORAL_TABLET | Freq: Two times a day (BID) | ORAL | Status: DC

## 2015-05-09 NOTE — Telephone Encounter (Signed)
Elizabeth from Outpatient pharmacy called to request the nurse to call back regarding pt Rx.

## 2015-05-09 NOTE — Telephone Encounter (Signed)
Please change to # [phone] as pt is taking bid

## 2015-05-09 NOTE — Telephone Encounter (Signed)
As this patient was seen by Dr. Randell Patient on 02/15/15 and deemed necessary for their treatment as documented in the EHR, I will refill this prescription for metformin. Given his last A1c, he should follow-up with [place] in the next month.

## 2015-05-10 ENCOUNTER — Telehealth (HOSPITAL_COMMUNITY): Payer: Self-pay | Admitting: *Deleted

## 2015-05-10 NOTE — Telephone Encounter (Signed)
DM wants pt to be seen in the Lipid Clinic. Message Gay Filler in the [facility] and she needs financial information from patient before she can process things.  Spoke with pt today 9/1 about this. He is going to get this information to her next week.  The patient will stay on Atorvastatin until he sees Gay Filler, bc Crestor is not covered in the OP clinic.   Pt understands.

## 2015-05-15 ENCOUNTER — Inpatient Hospital Stay (HOSPITAL_COMMUNITY): Admission: RE | Admit: 2015-05-15 | Payer: No Typology Code available for payment source | Source: Ambulatory Visit

## 2015-05-24 ENCOUNTER — Encounter (HOSPITAL_COMMUNITY): Payer: Self-pay

## 2015-05-24 ENCOUNTER — Ambulatory Visit (HOSPITAL_COMMUNITY)
Admission: RE | Admit: 2015-05-24 | Discharge: 2015-05-24 | Disposition: A | Discharge status: Home / Self Care | Payer: No Typology Code available for payment source | Source: Ambulatory Visit | Attending: Internal Medicine | Admitting: Internal Medicine

## 2015-05-24 ENCOUNTER — Encounter: Payer: Self-pay | Admitting: Licensed Clinical Social Worker

## 2015-05-24 VITALS — BP 160/110 | HR 83 | Wt 326.0 lb

## 2015-05-24 DIAGNOSIS — Z79899 Other long term (current) drug therapy: Secondary | ICD-10-CM | POA: Insufficient documentation

## 2015-05-24 DIAGNOSIS — I10 Essential (primary) hypertension: Secondary | ICD-10-CM | POA: Insufficient documentation

## 2015-05-24 DIAGNOSIS — J45909 Unspecified asthma, uncomplicated: Secondary | ICD-10-CM | POA: Insufficient documentation

## 2015-05-24 DIAGNOSIS — I428 Other cardiomyopathies: Secondary | ICD-10-CM | POA: Insufficient documentation

## 2015-05-24 DIAGNOSIS — I5042 Chronic combined systolic (congestive) and diastolic (congestive) heart failure: Secondary | ICD-10-CM

## 2015-05-24 DIAGNOSIS — Z7982 Long term (current) use of aspirin: Secondary | ICD-10-CM | POA: Insufficient documentation

## 2015-05-24 DIAGNOSIS — G4730 Sleep apnea, unspecified: Secondary | ICD-10-CM

## 2015-05-24 DIAGNOSIS — R29818 Other symptoms and signs involving the nervous system: Secondary | ICD-10-CM | POA: Insufficient documentation

## 2015-05-24 DIAGNOSIS — E785 Hyperlipidemia, unspecified: Secondary | ICD-10-CM | POA: Insufficient documentation

## 2015-05-24 DIAGNOSIS — E119 Type 2 diabetes mellitus without complications: Secondary | ICD-10-CM | POA: Insufficient documentation

## 2015-05-24 DIAGNOSIS — I5022 Chronic systolic (congestive) heart failure: Secondary | ICD-10-CM | POA: Insufficient documentation

## 2015-05-24 LAB — BASIC METABOLIC PANEL
Anion gap: 6 (ref 5–15)
BUN: 13 mg/dL (ref 6–20)
CHLORIDE: 107 mmol/L (ref 101–111)
CO2: 29 mmol/L (ref 22–32)
Calcium: 8.4 mg/dL — ABNORMAL LOW (ref 8.9–10.3)
Creatinine, Ser: 1.43 mg/dL — ABNORMAL HIGH (ref 0.61–1.24)
GFR calc non Af Amer: >60 mL/min (ref >60)
Glucose, Bld: 149 mg/dL — ABNORMAL HIGH (ref 65–99)
POTASSIUM: 3.9 mmol/L (ref 3.5–5.1)
SODIUM: 142 mmol/L (ref 135–145)

## 2015-05-24 MED ORDER — ISOSORBIDE MONONITRATE ER 30 MG PO TB24
30.0000 mg | ORAL_TABLET | Freq: Every day | ORAL | Status: DC
Start: 2015-05-24 — End: 2015-12-11

## 2015-05-24 MED ORDER — GLIPIZIDE 5 MG PO TABS: 5.0000 mg | ORAL_TABLET | Freq: Every day | ORAL | Status: DC

## 2015-05-24 MED ORDER — HYDRALAZINE HCL 50 MG PO TABS: 75.0000 mg | ORAL_TABLET | Freq: Three times a day (TID) | ORAL | Status: DC

## 2015-05-24 MED ORDER — TORSEMIDE 20 MG PO TABS: 80.0000 mg | ORAL_TABLET | Freq: Every day | ORAL | Status: DC

## 2015-05-24 MED ORDER — LISINOPRIL 20 MG PO TABS: 20.0000 mg | ORAL_TABLET | Freq: Every day | ORAL | Status: DC

## 2015-05-24 NOTE — Patient Instructions (Addendum)
STOP Bidil   START Hydralazine 75 mg (1.5 tablets) three times a day.  Start Imdur 30 mg daily (1 tablet)  Please take financial information to the [facility] on [facility] to [place]  All refills have been called in and will be ready at the Outpatient Clinic  Draw labs today  Will see you back in 2 weeks with Amy.   Check Blood Pressure daily and bring readings with you to next appt.

## 2015-05-24 NOTE — Progress Notes (Signed)
CSW referred to assist with PCP. Patient reports that he has a provider at [facility] although has not been recently. CSW encouraged patient to make an appointment for general care. Patient reports he was recently denied for disability and has not yet appealed. CSW discussed appeal process and encouraged patient to follow up with an appeal. Patient verbalizes understanding and will contact CSW if further needs arise. Raquel Sarna, [place]

## 2015-05-24 NOTE — Progress Notes (Signed)
Patient ID: Jeremy [name], male   DOB: 02/26/1981, 34 y.o.   MRN: [id number]  PCP: Dr. Randell Patient Primary cardiologist: Dr. Johnsie Cancel  34 yo with history of nonischemic cardiomyopathy, HTN, and DM presents for cardiology followup.  In 4/16, patient was seen in the ER with edema and chest pain.  The only medication he was taking was metformin.  BP was elevated.  Echo showed EF 20-25%, and Cardiolite suggested ischemia.  He had RHC/LHC in 5/16.  This showed no coronary disease and mildly elevated filling pressures.    He presents today for HF follow up.  Last visit he switched from lasix to torsemide. Overall feeling ok. Mild dyspnea with steps. Denies Orthopnea/PND. Not weighing  at home because he doesn't have scale . Getting most medications at [facility] outpatient pharmacy. Taking all medications. Just took medications right before the visit. Having trouble paying for Bidil. SBP at home 130-150s.  Medicaid pending.   Labs (6/16): LDL 308, HDL 46, TGs 284 Labs (7/16): K 3.5, creatinine 1.0  PMH: 1. Type II diabetes since 2006.  2. HTN since around 2010.   3. Asthma 4. Hyperlipidemia 5. Morbid obesity 6. Cardiomyopathy: Nonischemic.  Echo (5/16) with EF 20-25%, diffuse hypokinesis, grade II diastolic dysfunction.  Lexiscan Cardiolite (5/16) with EF 28%, inferior/inferolateral/apical ischemia.  RHC/LHC (5/16) with normal coronaries, mean RA 11, PA 33/21 mean 28, mean PCWP 18, CI 2.53.  7. ABIs normal 8/16.   SH: Lives with girlfriend, nonsmoker, no cocaine, occasional marijuana, no ETOH.  Works as Art gallery manager.  FH: Father with CHF, died at 24.  Sister with CHF, PAD.   ROS: All systems reviewed and negative except as per HPI.    Current Outpatient Prescriptions  Medication Sig Dispense Refill  . aspirin EC 81 MG tablet Take 1 tablet (81 mg total) by mouth daily. 30 tablet 0  . atorvastatin (LIPITOR) 80 MG tablet Take 1 tablet (80 mg total) by mouth daily. 90 tablet 2  . carvedilol (COREG) 6.25 MG tablet  Take 1 tablet (6.25 mg total) by mouth 2 (two) times daily with a meal. 60 tablet 3  . glipiZIDE (GLUCOTROL) 5 MG tablet Take 1 tablet (5 mg total) by mouth daily. 30 tablet 3  . isosorbide-hydrALAZINE (BIDIL) 20-37.5 MG per tablet Take 1 tablet by mouth 3 (three) times daily. 90 tablet 3  . lisinopril (PRINIVIL,ZESTRIL) 20 MG tablet Take 1 tablet (20 mg total) by mouth daily. 30 tablet 11  . metFORMIN (GLUCOPHAGE) 1000 MG tablet Take 1 tablet (1,000 mg total) by mouth 2 (two) times daily with a meal. 60 tablet 2  . nitroGLYCERIN (NITROSTAT) 0.4 MG SL tablet Place 1 tablet (0.4 mg total) under the tongue every 5 (five) minutes as needed for chest pain. 25 tablet 3  . potassium chloride SA (K-DUR,KLOR-CON) 20 MEQ tablet Take 1 tablet (20 mEq total) by mouth daily. 90 tablet 3  . spironolactone (ALDACTONE) 25 MG tablet Take 1 tablet (25 mg total) by mouth daily. 90 tablet 3  . torsemide (DEMADEX) 20 MG tablet Take 4 tablets (80 mg total) by mouth daily. 180 tablet 3   No current facility-administered medications for this encounter.   BP 170/112 mmHg  Pulse 83  Wt 326 lb (147.873 kg)  SpO2 99%  General: NAD Neck: Thick, JVP 8-9 cm, no thyromegaly or thyroid nodule noted. Wife and son present Lungs: Clear to auscultation bilaterally with normal respiratory effort. CV: Nondisplaced PMI.  Heart regular S1/S2, no S3/S4, no murmur. No carotid bruit.  Abdomen: Soft, nontender, no hepatosplenomegaly, mild distention.  Skin: Intact without lesions or rashes.  Neurologic: Alert and oriented x 3.  Psych: Normal affect. Extremities: No clubbing or cyanosis. R and LLE 1+ edema.  Unable to palpate PT pulses.  HEENT: Normal.   Assessment/Plan:  1. Chronic systolic CHF: Nonischemic cardiomyopathy. ?from long-standing HTN. ECHO 01/2015 EF 20-25%  Volume status ok. Continue torsemide 80 mg daily.  - Continue current Coreg, lisinopril, and spironolactone.  - Due to cost will split Bidil up. Change  hydralazine to 75 mg tid and Imdur 30 mg daily.   - He will need repeat echo for ICD in 07/2015 Today he was provided a pill box, scale, and weight chart.   2. Suspected OSA: Sleepy/fatigued during day.  Sleep study scheduled end of September. 3. Hyperlipidemia: Possibly familial, LDL still running high. Plans to go to back lipid clinic. He needs to provide lipid clinic with financial information to qualify for zetia. Taking atorvastatin 80 mg daily.  4. HTN: Uncontrolled today but just had medications. Increasing hydralazine as above.    Check BMET today. Follow up 2 weeks .   BMET today stable. Repeat next visit.    CLEGG,AMY NP-C  05/24/2015

## 2015-05-24 NOTE — Progress Notes (Signed)
Advanced Heart Failure Medication Review by a Pharmacist  Does the patient  feel that his/her medications are working for him/her?  yes  Has the patient been experiencing any side effects to the medications prescribed?  no  Does the patient measure his/her own blood pressure or blood glucose at home?  no   Does the patient have any problems obtaining medications due to transportation or finances?   no  Understanding of regimen: good Understanding of indications: good Potential of compliance: good    Pharmacist comments:  Mr. Tripp is a pleasant 34 yo M presenting with his wife and son but without a medication list. He seems to have a good understanding of his regimen and reports excellent compliance with all of his medications. He is receiving his HF medications through the HF fund but was asking about coverage for Bidil. We may need to consider utilizing hydralazine and isosorbide separately since these would be covered through our fund. He is in need of multiple refills and these will be called in for him today. He did not have any other medication-related questions or concerns at this time.  Ruta Hinds. Velva Harman, PharmD, BCPS, CPP Clinical Pharmacist Pager: [phone] Phone: [phone] 05/24/2015 10:41 AM

## 2015-05-25 DIAGNOSIS — Z0271 Encounter for disability determination: Secondary | ICD-10-CM

## 2015-06-06 ENCOUNTER — Ambulatory Visit (HOSPITAL_BASED_OUTPATIENT_CLINIC_OR_DEPARTMENT_OTHER): Payer: No Typology Code available for payment source

## 2015-06-07 ENCOUNTER — Inpatient Hospital Stay (HOSPITAL_COMMUNITY): Admission: RE | Admit: 2015-06-07 | Payer: No Typology Code available for payment source | Source: Ambulatory Visit

## 2015-06-12 ENCOUNTER — Ambulatory Visit (HOSPITAL_BASED_OUTPATIENT_CLINIC_OR_DEPARTMENT_OTHER): Payer: No Typology Code available for payment source | Attending: Cardiology

## 2015-07-10 ENCOUNTER — Inpatient Hospital Stay (HOSPITAL_COMMUNITY): Admission: RE | Admit: 2015-07-10 | Payer: No Typology Code available for payment source | Source: Ambulatory Visit

## 2015-07-20 ENCOUNTER — Other Ambulatory Visit: Payer: Self-pay | Admitting: Internal Medicine

## 2015-07-20 ENCOUNTER — Encounter: Payer: No Typology Code available for payment source | Admitting: Internal Medicine

## 2015-07-20 DIAGNOSIS — E119 Type 2 diabetes mellitus without complications: Secondary | ICD-10-CM

## 2015-08-14 ENCOUNTER — Ambulatory Visit (HOSPITAL_COMMUNITY)
Admission: RE | Admit: 2015-08-14 | Discharge: 2015-08-14 | Disposition: A | Discharge status: Home / Self Care | Payer: No Typology Code available for payment source | Source: Ambulatory Visit | Attending: Cardiology | Admitting: Cardiology

## 2015-08-14 ENCOUNTER — Other Ambulatory Visit: Payer: Self-pay | Admitting: Internal Medicine

## 2015-08-14 VITALS — BP 112/62 | HR 91 | Wt 314.4 lb

## 2015-08-14 DIAGNOSIS — I5042 Chronic combined systolic (congestive) and diastolic (congestive) heart failure: Secondary | ICD-10-CM

## 2015-08-14 DIAGNOSIS — R29818 Other symptoms and signs involving the nervous system: Secondary | ICD-10-CM

## 2015-08-14 DIAGNOSIS — I110 Hypertensive heart disease with heart failure: Secondary | ICD-10-CM | POA: Insufficient documentation

## 2015-08-14 DIAGNOSIS — I10 Essential (primary) hypertension: Secondary | ICD-10-CM

## 2015-08-14 DIAGNOSIS — I428 Other cardiomyopathies: Secondary | ICD-10-CM

## 2015-08-14 DIAGNOSIS — E785 Hyperlipidemia, unspecified: Secondary | ICD-10-CM | POA: Insufficient documentation

## 2015-08-14 DIAGNOSIS — G4730 Sleep apnea, unspecified: Secondary | ICD-10-CM

## 2015-08-14 DIAGNOSIS — I429 Cardiomyopathy, unspecified: Secondary | ICD-10-CM

## 2015-08-14 DIAGNOSIS — I5022 Chronic systolic (congestive) heart failure: Secondary | ICD-10-CM | POA: Insufficient documentation

## 2015-08-14 MED ORDER — CARVEDILOL 6.25 MG PO TABS: 9.3750 mg | ORAL_TABLET | Freq: Two times a day (BID) | ORAL | Status: DC

## 2015-08-14 MED ORDER — TORSEMIDE 20 MG PO TABS: 80.0000 mg | ORAL_TABLET | Freq: Every day | ORAL | Status: DC

## 2015-08-14 MED ORDER — NITROGLYCERIN 0.4 MG SL SUBL: 0.4000 mg | SUBLINGUAL_TABLET | SUBLINGUAL | Status: DC | PRN

## 2015-08-14 MED ORDER — ASPIRIN EC 81 MG PO TBEC: 81.0000 mg | DELAYED_RELEASE_TABLET | Freq: Every day | ORAL | Status: DC

## 2015-08-14 MED ORDER — HYDRALAZINE HCL 50 MG PO TABS: 75.0000 mg | ORAL_TABLET | Freq: Three times a day (TID) | ORAL | Status: DC

## 2015-08-14 MED ORDER — ISOSORBIDE MONONITRATE ER 30 MG PO TB24: 30.0000 mg | ORAL_TABLET | Freq: Every day | ORAL | Status: DC

## 2015-08-14 MED ORDER — ATORVASTATIN CALCIUM 80 MG PO TABS: 80.0000 mg | ORAL_TABLET | Freq: Every day | ORAL | Status: DC

## 2015-08-14 MED ORDER — SPIRONOLACTONE 25 MG PO TABS: 25.0000 mg | ORAL_TABLET | Freq: Every day | ORAL | Status: DC

## 2015-08-14 MED ORDER — POTASSIUM CHLORIDE CRYS ER 20 MEQ PO TBCR: 20.0000 meq | EXTENDED_RELEASE_TABLET | Freq: Every day | ORAL | Status: DC

## 2015-08-14 MED ORDER — LISINOPRIL 20 MG PO TABS: 20.0000 mg | ORAL_TABLET | Freq: Every day | ORAL | Status: DC

## 2015-08-14 NOTE — Patient Instructions (Signed)
INCREASE Coreg to 9.375mg , one and one half tab twice a day  Your physician has requested that you have an echocardiogram. Echocardiography is a painless test that uses sound waves to create images of your heart. It provides your doctor with information about the size and shape of your heart and how well your heart's chambers and valves are working. This procedure takes approximately one hour. There are no restrictions for this procedure.  Your physician recommends that you schedule a follow-up appointment in: 6 weeks in the NP clinic  Do the following things EVERYDAY: 1) Weigh yourself in the morning before breakfast. Write it down and keep it in a log. 2) Take your medicines as prescribed 3) Eat low salt foods-Limit salt (sodium) to 2000 mg per day.  4) Stay as active as you can everyday 5) Limit all fluids for the day to less than 2 liters 6)

## 2015-08-14 NOTE — Progress Notes (Signed)
Patient ID: Jeremy [name], male   DOB: 15-Aug-1981, 34 y.o.   MRN: [id number]  PCP: Dr. Randell Patient Primary cardiologist: Dr. Johnsie Cancel  34 yo with history of nonischemic cardiomyopathy, HTN, and DM presents for cardiology followup.  In 4/16, patient was seen in the ER with edema and chest pain.  The only medication he was taking was metformin.  BP was elevated.  Echo showed EF 20-25%, and Cardiolite suggested ischemia.  He had RHC/LHC in 5/16.  This showed no coronary disease and mildly elevated filling pressures.    He presents today for HF follow up.  Last visit bidil stopped and he was switched to hydralzine/imdur. Overall much improved and feels better. Denies SOB/PND/Orthopnea. Weight at home 313-315 pounds. This is down about 10 pounds from previous visits. SBP at home down below 140. He has been able to stand on his feet 10 hours a day 4 days week at [employer]. Taking all medications. Occasionally drinks alcohol.   Labs (6/16): LDL 308, HDL 46, TGs 284 Labs (7/16): K 3.5, creatinine 1.0 Labs 05/24/2015: K 3.9 Creatinine 1.43   PMH: 1. Type II diabetes since 2006.  2. HTN since around 2010.   3. Asthma 4. Hyperlipidemia 5. Morbid obesity 6. Cardiomyopathy: Nonischemic.  Echo (5/16) with EF 20-25%, diffuse hypokinesis, grade II diastolic dysfunction.  Lexiscan Cardiolite (5/16) with EF 28%, inferior/inferolateral/apical ischemia.  RHC/LHC (5/16) with normal coronaries, mean RA 11, PA 33/21 mean 28, mean PCWP 18, CI 2.53.  7. ABIs normal 8/16.   SH: Lives with girlfriend, nonsmoker, no cocaine, occasional marijuana, no ETOH.  Works as Art gallery manager.  FH: Father with CHF, died at 70.  Sister with CHF, PAD.   ROS: All systems reviewed and negative except as per HPI.    Current Outpatient Prescriptions  Medication Sig Dispense Refill  . aspirin EC 81 MG tablet Take 1 tablet (81 mg total) by mouth daily. 30 tablet 0  . atorvastatin (LIPITOR) 80 MG tablet Take 1 tablet (80 mg total) by mouth daily.  90 tablet 2  . carvedilol (COREG) 6.25 MG tablet TAKE 1 TABLET BY MOUTH TWICE DAILY WITH MEALS 60 tablet 2  . glipiZIDE (GLUCOTROL) 5 MG tablet Take 1 tablet (5 mg total) by mouth daily. 30 tablet 3  . hydrALAZINE (APRESOLINE) 50 MG tablet Take 1.5 tablets (75 mg total) by mouth 3 (three) times daily. 90 tablet 3  . isosorbide mononitrate (IMDUR) 30 MG 24 hr tablet Take 1 tablet (30 mg total) by mouth daily. 30 tablet 3  . lisinopril (PRINIVIL,ZESTRIL) 20 MG tablet Take 1 tablet (20 mg total) by mouth daily. 30 tablet 3  . metFORMIN (GLUCOPHAGE) 1000 MG tablet Take 1 tablet (1,000 mg total) by mouth 2 (two) times daily with a meal. 60 tablet 2  . potassium chloride SA (K-DUR,KLOR-CON) 20 MEQ tablet Take 1 tablet (20 mEq total) by mouth daily. 90 tablet 3  . spironolactone (ALDACTONE) 25 MG tablet Take 1 tablet (25 mg total) by mouth daily. 90 tablet 3  . torsemide (DEMADEX) 20 MG tablet Take 4 tablets (80 mg total) by mouth daily. 180 tablet 3  . nitroGLYCERIN (NITROSTAT) 0.4 MG SL tablet Place 1 tablet (0.4 mg total) under the tongue every 5 (five) minutes as needed for chest pain. (Patient not taking: Reported on 08/14/2015) 25 tablet 3   No current facility-administered medications for this encounter.   BP 112/62 mmHg  Pulse 91  Wt 314 lb 6.4 oz (142.611 kg)  SpO2 97%  General:  NAD Neck: Thick, JVP 5-6 m, no thyromegaly or thyroid nodule noted. Wife and son present Lungs: Clear to auscultation bilaterally with normal respiratory effort. CV: Nondisplaced PMI.  Heart regular S1/S2, no S3/S4, no murmur. No carotid bruit.   Abdomen: Soft, nontender, no hepatosplenomegaly, mild distention.  Skin: Intact without lesions or rashes.  Neurologic: Alert and oriented x 3.  Psych: Normal affect. Extremities: No clubbing or cyanosis. No lower extremity edema.   HEENT: Normal.   Assessment/Plan:  1. Chronic systolic CHF: Nonischemic cardiomyopathy. ?from long-standing HTN. ECHO 01/2015 EF 20-25%  .Repeat ECHO next week. If EF remains down will need to consider ICD. He is working on Insurance underwriter.  Volume status ok. Continue torsemide 80 mg daily.  - Increase carvedilol to 9.375 mg twice day .  Continue lisinopril 20 mg daily. If  EF emains low can switch to entresto at next visit.  Continue hydralazine to 75 mg tid and Imdur 30 mg daily.   2. Suspected OSA: Sleepy/fatigued during day.  Need reschedule sleep study.  3. . Hyperlipidemia: Possibly familial, LDL still running high. Plans to go to back lipid clinic. He needs to provide lipid clinic with financial information to qualify for zetia. Taking atorvastatin 80 mg daily. I have asked him to follow up in [facility].  4. HTN: Much improved. Continue current regimen.    ECHO next week. Follow up in 6 weeks. If EF remains down  <35% will consider EP consult. Also consider entresto.    Amy Clegg NP-C  08/14/2015

## 2015-08-14 NOTE — Progress Notes (Signed)
Advanced Heart Failure Medication Review by a Pharmacist  Does the patient  feel that his/her medications are working for him/her?  yes  Has the patient been experiencing any side effects to the medications prescribed?  no  Does the patient measure his/her own blood pressure or blood glucose at home?  no   Does the patient have any problems obtaining medications due to transportation or finances?   no  Understanding of regimen: good Understanding of indications: good Potential of compliance: good Patient understands to avoid NSAIDs. Patient understands to avoid decongestants.  Issues to address at subsequent visits: None   Pharmacist comments:  Mr. Hoying is a pleasant 34 yo M presenting with his wife and son and without a medication list. He reports excellent compliance with his medications and did not have any specific medication-related questions or concerns for me at this time.   Ruta Hinds. Velva Harman, PharmD, BCPS, CPP Clinical Pharmacist Pager: [phone] Phone: [phone] 08/14/2015 3:14 PM      Time with patient: 8 minutes Preparation and documentation time: 2 minutes Total time: 10 minutes

## 2015-08-21 ENCOUNTER — Ambulatory Visit (HOSPITAL_COMMUNITY): Payer: No Typology Code available for payment source

## 2015-08-28 ENCOUNTER — Ambulatory Visit (HOSPITAL_COMMUNITY): Admission: RE | Admit: 2015-08-28 | Payer: Self-pay | Source: Ambulatory Visit

## 2015-09-14 ENCOUNTER — Other Ambulatory Visit (HOSPITAL_COMMUNITY): Payer: Self-pay | Admitting: Adult Health

## 2015-09-14 MED FILL — CARVEDILOL 6.25 MG TABLET: 6.25 | 30 days supply | Qty: 90 | Fill #1

## 2015-09-14 MED FILL — ATORVASTATIN 80 MG TABLET: 80 | 30 days supply | Qty: 30 | Fill #1

## 2015-09-14 MED FILL — ISOSORBIDE MN ER 30 MG TAB: 30 | 30 days supply | Qty: 30 | Fill #1

## 2015-09-14 MED FILL — NITROGLYCERIN 0.4 MG TAB SL: 0.4 | 15 days supply | Qty: 25 | Fill #1

## 2015-09-14 MED FILL — POTASSIUM CL ER 20 MEQ TAB: 20 | 30 days supply | Qty: 30 | Fill #1

## 2015-09-14 MED FILL — ASPIR-LOW EC 81 MG TABLET: 81 | 90 days supply | Qty: 90 | Fill #0

## 2015-09-14 MED FILL — TORSEMIDE 20 MG TABLET: 20 | 30 days supply | Qty: 120 | Fill #1

## 2015-09-14 MED FILL — SPIRONOLACTONE 25 MG TABLET: 25 | 30 days supply | Qty: 30 | Fill #1

## 2015-09-14 MED FILL — metFORMIN HCL 1000 MG TABS: 1000 | 30 days supply | Qty: 60 | Fill #2

## 2015-09-14 MED FILL — LISINOPRIL 20 MG TABLET: 20 | 30 days supply | Qty: 30 | Fill #1

## 2015-09-14 MED FILL — hydrALAZINE HCL 50 MG TABS: 50 | 30 days supply | Qty: 135 | Fill #1

## 2015-09-25 ENCOUNTER — Encounter (HOSPITAL_COMMUNITY): Payer: No Typology Code available for payment source

## 2015-10-10 ENCOUNTER — Telehealth (HOSPITAL_COMMUNITY): Payer: Self-pay

## 2015-10-10 NOTE — Telephone Encounter (Signed)
Updated records from 03/23/15 to present faxed to [facility] [phone]) Case # [id number]

## 2015-10-11 ENCOUNTER — Other Ambulatory Visit (HOSPITAL_COMMUNITY): Payer: Self-pay | Admitting: Adult Health

## 2015-10-11 MED FILL — LISINOPRIL 20 MG TABLET: 20 | 30 days supply | Qty: 30 | Fill #2

## 2015-10-11 MED FILL — ATORVASTATIN 80 MG TABLET: 80 | 30 days supply | Qty: 30 | Fill #2

## 2015-10-11 MED FILL — glipiZIDE 5 MG TABS: 5 | 30 days supply | Qty: 30 | Fill #0

## 2015-10-11 MED FILL — POTASSIUM CL ER 20 MEQ TAB: 20 | 30 days supply | Qty: 30 | Fill #2

## 2015-10-11 MED FILL — TORSEMIDE 20 MG TABLET: 20 | 30 days supply | Qty: 120 | Fill #2

## 2015-10-11 MED FILL — hydrALAZINE HCL 50 MG TABS: 50 | 30 days supply | Qty: 135 | Fill #2

## 2015-10-11 MED FILL — SPIRONOLACTONE 25 MG TABLET: 25 | 30 days supply | Qty: 30 | Fill #2

## 2015-10-11 MED FILL — ISOSORBIDE MN ER 30 MG TAB: 30 | 30 days supply | Qty: 30 | Fill #2

## 2015-10-11 MED FILL — CARVEDILOL 6.25 MG TABLET: 6.25 | 30 days supply | Qty: 90 | Fill #2

## 2015-10-15 ENCOUNTER — Other Ambulatory Visit (HOSPITAL_COMMUNITY): Payer: Self-pay | Admitting: Adult Health

## 2015-10-15 ENCOUNTER — Telehealth (HOSPITAL_COMMUNITY): Payer: Self-pay | Admitting: Vascular Surgery

## 2015-10-15 ENCOUNTER — Telehealth (HOSPITAL_COMMUNITY): Payer: Self-pay | Admitting: *Deleted

## 2015-10-15 DIAGNOSIS — I5042 Chronic combined systolic (congestive) and diastolic (congestive) heart failure: Secondary | ICD-10-CM

## 2015-10-15 MED ORDER — HYDRALAZINE HCL 50 MG PO TABS: 75.0000 mg | ORAL_TABLET | Freq: Three times a day (TID) | ORAL | Status: DC

## 2015-10-15 MED FILL — POTASSIUM CL ER 20 MEQ TAB: 20 | 30 days supply | Qty: 30 | Fill #3

## 2015-10-15 MED FILL — glipiZIDE 5 MG TABS: 5 | 30 days supply | Qty: 30 | Fill #1

## 2015-10-15 MED FILL — LISINOPRIL 20 MG TABLET: 20 | 30 days supply | Qty: 30 | Fill #3

## 2015-10-15 MED FILL — TORSEMIDE 20 MG TABLET: 20 | 30 days supply | Qty: 120 | Fill #3

## 2015-10-15 MED FILL — CARVEDILOL 6.25 MG TABLET: 6.25 | 30 days supply | Qty: 90 | Fill #0

## 2015-10-15 MED FILL — ISOSORBIDE MN ER 30 MG TAB: 30 | 30 days supply | Qty: 30 | Fill #3

## 2015-10-15 MED FILL — ATORVASTATIN 80 MG TABLET: 80 | 30 days supply | Qty: 30 | Fill #3

## 2015-10-15 MED FILL — hydrALAZINE HCL 50 MG TABS: 50 | 30 days supply | Qty: 135 | Fill #0

## 2015-10-15 MED FILL — SPIRONOLACTONE 25 MG TABLET: 25 | 30 days supply | Qty: 30 | Fill #3

## 2015-10-15 NOTE — Telephone Encounter (Signed)
Pt's girlfriend called, she states pt got all his meds filled on Fri, afterwards he went to a car place and had car cleaned, meds were accidentally thrown away by car wash staff.  When she realized this on Fri nigh and went back they could not find the meds.  Spoke w/pharmacy, they will fill all meds again today, advised girlfriend and she will p/u later today

## 2015-10-15 NOTE — Telephone Encounter (Signed)
Returned pt call to make appt 

## 2015-10-24 ENCOUNTER — Other Ambulatory Visit: Payer: Self-pay

## 2015-10-24 DIAGNOSIS — E119 Type 2 diabetes mellitus without complications: Secondary | ICD-10-CM

## 2015-10-24 MED ORDER — METFORMIN HCL 1000 MG PO TABS: 1000.0000 mg | ORAL_TABLET | Freq: Two times a day (BID) | ORAL | Status: DC

## 2015-10-24 NOTE — Telephone Encounter (Signed)
As this patient was seen by Dr. Randell Patient on 02/15/15 and deemed necessary for their treatment as documented in the EHR, I will refill this prescription for metformin. I will write for a 30-day supply as he has not followed up with [place] since June 2016.

## 2015-10-25 ENCOUNTER — Telehealth: Payer: Self-pay | Admitting: Dietician

## 2015-10-25 MED FILL — metFORMIN HCL 1000 MG TABS: 1000 | 30 days supply | Qty: 60 | Fill #0

## 2015-10-25 NOTE — Telephone Encounter (Signed)
Calling patient as part of project to assist those with A1C between 8-9% to lower their A1Cs and per Dr. Posey Pronto because he is overdue for an appointment. I do not see where any have been scheduled in past 6 months. Left message asking him to call office and schedule an appointment as soon as possible.

## 2015-10-26 DIAGNOSIS — Z736 Limitation of activities due to disability: Secondary | ICD-10-CM

## 2015-10-29 NOTE — Telephone Encounter (Signed)
Patient agreed to schedule an appointment and call was transferred to front office.

## 2015-11-01 DIAGNOSIS — Z736 Limitation of activities due to disability: Secondary | ICD-10-CM

## 2015-11-07 ENCOUNTER — Ambulatory Visit: Payer: No Typology Code available for payment source | Admitting: Internal Medicine

## 2015-11-07 ENCOUNTER — Telehealth: Payer: Self-pay | Admitting: Dietician

## 2015-11-07 NOTE — Telephone Encounter (Signed)
Called patient's pharmacies  as part of project trying to help patient's with a1C between 8-9% lower their blood sugars to target for diabetes medication refill history.  [facility]:both Glipizide and metformin filled in February 2017  [facility]:glipizide last fill was 04/2015, metformin filled June, July and August 2016  [facility] will remove this pharmacy from his list.

## 2015-11-09 MED FILL — hydrALAZINE HCL 50 MG TABS: 50 | 30 days supply | Qty: 135 | Fill #1

## 2015-11-09 MED FILL — SPIRONOLACTONE 25 MG TABLET: 25 | 30 days supply | Qty: 30 | Fill #4

## 2015-11-09 MED FILL — POTASSIUM CL ER 20 MEQ TAB: 20 | 30 days supply | Qty: 30 | Fill #4

## 2015-11-09 MED FILL — CARVEDILOL 6.25 MG TABLET: 6.25 | 30 days supply | Qty: 90 | Fill #1

## 2015-11-09 MED FILL — ATORVASTATIN 80 MG TABLET: 80 | 30 days supply | Qty: 30 | Fill #4

## 2015-11-09 MED FILL — ISOSORBIDE MN ER 30 MG TAB: 30 | 30 days supply | Qty: 30 | Fill #3

## 2015-11-09 MED FILL — glipiZIDE 5 MG TABS: 5 | 30 days supply | Qty: 30 | Fill #2

## 2015-11-09 MED FILL — TORSEMIDE 20 MG TABLET: 20 | 30 days supply | Qty: 120 | Fill #4

## 2015-11-09 MED FILL — LISINOPRIL 20 MG TABLET: 20 | 30 days supply | Qty: 30 | Fill #3

## 2015-11-13 ENCOUNTER — Ambulatory Visit: Payer: No Typology Code available for payment source | Admitting: Internal Medicine

## 2015-11-15 ENCOUNTER — Ambulatory Visit (HOSPITAL_COMMUNITY)
Admission: RE | Admit: 2015-11-15 | Discharge: 2015-11-15 | Disposition: A | Discharge status: Home / Self Care | Payer: No Typology Code available for payment source | Source: Ambulatory Visit | Attending: Internal Medicine | Admitting: Internal Medicine

## 2015-11-15 VITALS — BP 130/80 | HR 74 | Wt 327.4 lb

## 2015-11-15 DIAGNOSIS — E119 Type 2 diabetes mellitus without complications: Secondary | ICD-10-CM | POA: Insufficient documentation

## 2015-11-15 DIAGNOSIS — I5042 Chronic combined systolic (congestive) and diastolic (congestive) heart failure: Secondary | ICD-10-CM

## 2015-11-15 DIAGNOSIS — I428 Other cardiomyopathies: Secondary | ICD-10-CM | POA: Insufficient documentation

## 2015-11-15 DIAGNOSIS — G4730 Sleep apnea, unspecified: Secondary | ICD-10-CM

## 2015-11-15 DIAGNOSIS — J45909 Unspecified asthma, uncomplicated: Secondary | ICD-10-CM | POA: Insufficient documentation

## 2015-11-15 DIAGNOSIS — I5022 Chronic systolic (congestive) heart failure: Secondary | ICD-10-CM | POA: Insufficient documentation

## 2015-11-15 DIAGNOSIS — I10 Essential (primary) hypertension: Secondary | ICD-10-CM

## 2015-11-15 DIAGNOSIS — E669 Obesity, unspecified: Secondary | ICD-10-CM

## 2015-11-15 DIAGNOSIS — Z79899 Other long term (current) drug therapy: Secondary | ICD-10-CM | POA: Insufficient documentation

## 2015-11-15 DIAGNOSIS — Z8249 Family history of ischemic heart disease and other diseases of the circulatory system: Secondary | ICD-10-CM | POA: Insufficient documentation

## 2015-11-15 DIAGNOSIS — Z7984 Long term (current) use of oral hypoglycemic drugs: Secondary | ICD-10-CM | POA: Insufficient documentation

## 2015-11-15 DIAGNOSIS — R29818 Other symptoms and signs involving the nervous system: Secondary | ICD-10-CM

## 2015-11-15 DIAGNOSIS — Z7982 Long term (current) use of aspirin: Secondary | ICD-10-CM | POA: Insufficient documentation

## 2015-11-15 DIAGNOSIS — E785 Hyperlipidemia, unspecified: Secondary | ICD-10-CM

## 2015-11-15 DIAGNOSIS — R609 Edema, unspecified: Secondary | ICD-10-CM

## 2015-11-15 LAB — BASIC METABOLIC PANEL
ANION GAP: 8 (ref 5–15)
BUN: 35 mg/dL — ABNORMAL HIGH (ref 6–20)
CALCIUM: 8.8 mg/dL — AB (ref 8.9–10.3)
CO2: 26 mmol/L (ref 22–32)
Chloride: 108 mmol/L (ref 101–111)
Creatinine, Ser: 1.71 mg/dL — ABNORMAL HIGH (ref 0.61–1.24)
GFR calc Af Amer: 58 mL/min — ABNORMAL LOW (ref >60)
GFR, EST NON AFRICAN AMERICAN: 50 mL/min — AB (ref >60)
Glucose, Bld: 88 mg/dL (ref 65–99)
Potassium: 4.6 mmol/L (ref 3.5–5.1)
Sodium: 142 mmol/L (ref 135–145)

## 2015-11-15 LAB — BRAIN NATRIURETIC PEPTIDE: B NATRIURETIC PEPTIDE 5: 24.7 pg/mL (ref 0.0–100.0)

## 2015-11-15 MED ORDER — HYDRALAZINE HCL 100 MG PO TABS: 100.0000 mg | ORAL_TABLET | Freq: Three times a day (TID) | ORAL | Status: DC

## 2015-11-15 MED ORDER — TORSEMIDE 20 MG PO TABS: 80.0000 mg | ORAL_TABLET | Freq: Every day | ORAL | Status: DC

## 2015-11-15 MED FILL — TORSEMIDE 20 MG TABLET: 20 | 33 days supply | Qty: 200 | Fill #0

## 2015-11-15 NOTE — Patient Instructions (Addendum)
INCREASE hydralazine to 100 mg, one tab three times per day You may take additional 20-40 mg as needed for 3 lb weight gain overnight or 5 lbs weight gain in a week  Your physician has requested that you have an echocardiogram. Echocardiography is a painless test that uses sound waves to create images of your heart. It provides your doctor with information about the size and shape of your heart and how well your heart's chambers and valves are working. This procedure takes approximately one hour. There are no restrictions for this procedure.  You have been referred to [facility]  Your physician recommends that you schedule a follow-up appointment in: 6 weeks with Dr.McLean  Do the following things EVERYDAY: 1) Weigh yourself in the morning before breakfast. Write it down and keep it in a log. 2) Take your medicines as prescribed 3) Eat low salt foods-Limit salt (sodium) to 2000 mg per day.  4) Stay as active as you can everyday 5) Limit all fluids for the day to less than 2 liters 6)

## 2015-11-15 NOTE — Progress Notes (Signed)
Advanced Heart Failure Medication Review by a Pharmacist  Does the patient  feel that his/her medications are working for him/her?  yes  Has the patient been experiencing any side effects to the medications prescribed?  no  Does the patient measure his/her own blood pressure or blood glucose at home?  yes .  Pt reports last BP 145/84 at home  Does the patient have any problems obtaining medications due to transportation or finances?   no  Understanding of regimen: good Understanding of indications: good Potential of compliance: good Patient understands to avoid NSAIDs. Patient understands to avoid decongestants.  Issues to address at subsequent visits: None   Pharmacist comments:  35 YO pleasant but quiet male presents with his family member that takes care of his medicine.  Pt and family report no SE or adherence issues with current regimen. Pt requests pill box and pill cutter from clinic.  Both were supplied to patient.      Time with patient: 10 min  Preparation and documentation time: 5 min  Total time: 15 min

## 2015-11-15 NOTE — Progress Notes (Addendum)
Patient ID: Jeremy [name], male   DOB: [birth year], 35 y.o.   MRN: [id number]    Advanced Heart Failure Clinic Note   PCP: Dr. Randell Patient Primary cardiologist: Dr. Johnsie Cancel  35 yo with history of nonischemic cardiomyopathy, HTN, and DM presents for cardiology followup.  In 4/16, patient was seen in the ER with edema and chest pain.  The only medication he was taking was metformin.  BP was elevated.  Echo showed EF 20-25%, and Cardiolite suggested ischemia.  He had RHC/LHC in 5/16.  This showed no coronary disease and mildly elevated filling pressures.    He presents today for HF follow up. Last visit we increased his coreg. Overall feels OK.  Has good and bad breathing days.  Gets SOB walking up stairs or hills.  Can walk about a block before getting SOB on flat ground. Occasionally has to stop in a grocery store. BPs at home SBPs 140s Weight at home up again to 320-325. Up 13 lbs since last visit. Still in barber school and enjoying it, though he is on his feet for ~ 10 hrs a day x 4 days a week. Taking all medications. Pees pretty well on torsemide. Has been eating more and per wife doing less activity, to explain weight gain.  He has NOT felt more SOB, DOE stable from previous.   Labs (6/16): LDL 308, HDL 46, TGs 284 Labs (7/16): K 3.5, creatinine 1.0 Labs 05/24/2015: K 3.9 Creatinine 1.43   PMH: 1. Type II diabetes since 2006.  2. HTN since around 2010.   3. Asthma 4. Hyperlipidemia 5. Morbid obesity 6. Cardiomyopathy: Nonischemic.  Echo (5/16) with EF 20-25%, diffuse hypokinesis, grade II diastolic dysfunction.  Lexiscan Cardiolite (5/16) with EF 28%, inferior/inferolateral/apical ischemia.  RHC/LHC (5/16) with normal coronaries, mean RA 11, PA 33/21 mean 28, mean PCWP 18, CI 2.53.  7. ABIs normal 8/16.   SH: Lives with girlfriend, nonsmoker, no cocaine, occasional marijuana, no ETOH.  Works as Art gallery manager.  FH: Father with CHF, died at 3.  Sister with CHF, PAD.   ROS: All systems reviewed and  negative except as per HPI.    Current Outpatient Prescriptions  Medication Sig Dispense Refill  . ASPIR-LOW 81 MG EC tablet TAKE 1 TABLET BY MOUTH DAILY. 30 tablet 11  . atorvastatin (LIPITOR) 80 MG tablet Take 1 tablet (80 mg total) by mouth daily. 90 tablet 2  . carvedilol (COREG) 6.25 MG tablet TAKE 1 & 1/2 TABLETS BY MOUTH 2 TIMES DAILY WITH A MEAL. 90 tablet 2  . glipiZIDE (GLUCOTROL) 5 MG tablet TAKE 1 TABLET BY MOUTH DAILY. 30 tablet PRN  . hydrALAZINE (APRESOLINE) 50 MG tablet Take 1.5 tablets (75 mg total) by mouth 3 (three) times daily. 135 tablet 2  . isosorbide mononitrate (IMDUR) 30 MG 24 hr tablet Take 1 tablet (30 mg total) by mouth daily. 30 tablet 3  . lisinopril (PRINIVIL,ZESTRIL) 20 MG tablet Take 1 tablet (20 mg total) by mouth daily. 30 tablet 3  . metFORMIN (GLUCOPHAGE) 1000 MG tablet Take 1 tablet (1,000 mg total) by mouth 2 (two) times daily with a meal. 60 tablet 0  . nitroGLYCERIN (NITROSTAT) 0.4 MG SL tablet Place 1 tablet (0.4 mg total) under the tongue every 5 (five) minutes as needed for chest pain. 25 tablet 3  . potassium chloride SA (K-DUR,KLOR-CON) 20 MEQ tablet Take 1 tablet (20 mEq total) by mouth daily. 90 tablet 3  . spironolactone (ALDACTONE) 25 MG tablet Take 1 tablet (25 mg  total) by mouth daily. 90 tablet 3  . torsemide (DEMADEX) 20 MG tablet Take 4 tablets (80 mg total) by mouth daily. 180 tablet 3   No current facility-administered medications for this encounter.   BP 130/80 mmHg  Pulse 74  Wt 327 lb 6.4 oz (148.508 kg)  SpO2 98%   Wt Readings from Last 3 Encounters:  11/15/15 327 lb 6.4 oz (148.508 kg)  08/14/15 314 lb 6.4 oz (142.611 kg)  05/24/15 326 lb (147.873 kg)     General: NAD Neck: Thick, JVP 6-7 m, no thyromegaly or thyroid nodule noted. Wife and son present Lungs: CTAB, normal effort. CV: Nondisplaced PMI.  Heart regular S1/S2, no S3/S4, no murmur. No carotid bruit.   Abdomen: Soft, NT, ND, no HSM. No bruits or masses. +BS    Skin: Intact without lesions or rashes.  Neurologic: Alert and oriented x 3.  Psych: Normal affect. Extremities: No clubbing or cyanosis. Trace ankle edema.   HEENT: Normal.   Assessment/Plan:  1. Chronic systolic CHF: Nonischemic cardiomyopathy. ?from long-standing HTN. ECHO 01/2015 EF 20-25% .Repeat ECHO next week. If EF remains down will need to consider ICD. He is working on Insurance underwriter.  Volume status despite weight gain. Has been eating a lot more.  - Continue torsemide 80 mg daily. Can take extra in 20-40 mg in evenings as needed for weight gains of 3 lbs overnight or 5 lbs within week.  - Continue carvedilol 9.375 mg BID - Continue lisinopril 20 mg daily. If EF emains low can switch to entresto at next visit.  - Increase hydralazine to 100 mg tid and Imdur 30 mg daily.   2. Suspected OSA: Sleepy/fatigued during day.   - Needs to reschedule sleep study. He has no showed last several.  3. . Hyperlipidemia: Possibly familial, LDL still running high.  - Instructed to call Lipid Clinic and return to them. Needs to provide lipid clinic with financial information to qualify for zetia.  - Continue atorvastatin 80 mg daily. 4. HTN:  - Slightly elevated. Med changes as above.    Again, will plan for ECHO in next 1-2 weeks.  Follow up in 6 weeks. If EF remains down  <35% will send EP consult. BMET/BNP today.    Satira Mccallum Belton Peplinski PA-C 11/15/2015

## 2015-11-19 ENCOUNTER — Ambulatory Visit: Payer: No Typology Code available for payment source | Admitting: Internal Medicine

## 2015-11-20 ENCOUNTER — Ambulatory Visit: Payer: No Typology Code available for payment source | Admitting: Pharmacist

## 2015-11-21 ENCOUNTER — Encounter: Payer: Self-pay | Admitting: Internal Medicine

## 2015-11-21 ENCOUNTER — Ambulatory Visit (INDEPENDENT_AMBULATORY_CARE_PROVIDER_SITE_OTHER): Payer: Self-pay | Admitting: Internal Medicine

## 2015-11-21 VITALS — BP 126/72 | HR 89 | Temp 98.2°F | Resp 18 | Ht 77.0 in | Wt 322.0 lb

## 2015-11-21 DIAGNOSIS — E119 Type 2 diabetes mellitus without complications: Secondary | ICD-10-CM

## 2015-11-21 DIAGNOSIS — B9689 Other specified bacterial agents as the cause of diseases classified elsewhere: Secondary | ICD-10-CM

## 2015-11-21 DIAGNOSIS — I10 Essential (primary) hypertension: Secondary | ICD-10-CM

## 2015-11-21 DIAGNOSIS — E1165 Type 2 diabetes mellitus with hyperglycemia: Secondary | ICD-10-CM

## 2015-11-21 DIAGNOSIS — N492 Inflammatory disorders of scrotum: Secondary | ICD-10-CM | POA: Insufficient documentation

## 2015-11-21 DIAGNOSIS — Z79899 Other long term (current) drug therapy: Secondary | ICD-10-CM

## 2015-11-21 DIAGNOSIS — Z7984 Long term (current) use of oral hypoglycemic drugs: Secondary | ICD-10-CM

## 2015-11-21 HISTORY — DX: Inflammatory disorders of scrotum: N49.2

## 2015-11-21 LAB — POCT GLYCOSYLATED HEMOGLOBIN (HGB A1C): Hemoglobin A1C: 7.8

## 2015-11-21 LAB — GLUCOSE, CAPILLARY: GLUCOSE-CAPILLARY: 84 mg/dL (ref 65–99)

## 2015-11-21 MED ORDER — DOXYCYCLINE HYCLATE 100 MG PO TBEC: 100.0000 mg | DELAYED_RELEASE_TABLET | Freq: Two times a day (BID) | ORAL | Status: DC

## 2015-11-21 MED FILL — DOXYCYCLINE HYCLATE 100 MG: 100 | 5 days supply | Qty: 10 | Fill #0

## 2015-11-21 NOTE — Progress Notes (Signed)
   Subjective:    Patient ID: Jeremy [name], male    DOB: 1981/03/23, 35 y.o.   MRN: [id number]  HPI  35 yo male with NICM EF 20-25% (no CAD based on RHC and LHC on 5/16),  HTN, DM II, here for DM II f/up.  Was seen by CHF clinic recently. They increased his hydral to 100mg  tid and imdur to 30mg  daily on 11/15/2015. Planning to repeat ECHO and may consider ICD if EF remains low.   DM II - on metformin 1000mg  bid + glipizide 5mg  daily. Last hgba1c was 8.9 on 01/2015. Today hgba1c is 7.8.   Has a boil on his left scrotal region. No drainage. Not sexually active. No hx of STD. No fever or chills. Wants abx for it.   Review of Systems  Constitutional: Negative for fever, chills and fatigue.  HENT: Negative for congestion and sore throat.   Respiratory: Negative for chest tightness and shortness of breath.   Cardiovascular: Negative for chest pain, palpitations and leg swelling.  Gastrointestinal: Negative for nausea, vomiting, abdominal pain, diarrhea and abdominal distention.  Endocrine: Negative.   Genitourinary: Negative for dysuria and hematuria.  Musculoskeletal: Negative for back pain and arthralgias.  Skin:       Has boil on left scrotum  Allergic/Immunologic: Negative.   Neurological: Negative for dizziness, numbness and headaches.  Hematological: Negative for adenopathy.  Psychiatric/Behavioral: Negative.        Objective:   Physical Exam  Constitutional: He is oriented to person, place, and time. He appears well-developed and well-nourished. No distress.  HENT:  Head: Normocephalic and atraumatic.  Mouth/Throat: No oropharyngeal exudate.  Eyes: EOM are normal. Pupils are equal, round, and reactive to light.  Neck: Normal range of motion. No JVD present.  Cardiovascular: Normal rate and regular rhythm.  Exam reveals no gallop and no friction rub.   No murmur heard. Pulmonary/Chest: Effort normal and breath sounds normal. No respiratory distress. He has no wheezes. He has  no rales.  Abdominal: Soft. Bowel sounds are normal. He exhibits no distension. There is no tenderness.  Genitourinary:  Has a 1.5 cm sized area of induration without erythema or discharge on left scrotum. No penile discharge. No other skin lesions.   Musculoskeletal: Normal range of motion. He exhibits no edema or tenderness.  Neurological: He is alert and oriented to person, place, and time. No cranial nerve deficit.  Skin: He is not diaphoretic.     Filed Vitals:   11/21/15 1606  BP: 126/72  Pulse: 89  Temp: 98.2 F (36.8 C)  Resp: 18        Assessment & Plan:  See problem based a&p.

## 2015-11-21 NOTE — Assessment & Plan Note (Signed)
Has a boil on left scrotum. No significant fluctuance, no drainage. This could be 2/2 to folliculitis. Not a safe area to do I&D on.   Will do doxy for 5 days. Continue warm compresses and keep area clean.

## 2015-11-21 NOTE — Patient Instructions (Signed)
Please keep doing warm compresses to the scrotal area. Keep the area clean. Take doxycycline twice a day for 5 days.  Will cehck your lab and call you based on there results. Keep taking your metformin and glipizide for now. F/up in 3 months.

## 2015-11-21 NOTE — Assessment & Plan Note (Addendum)
Last hgba1c was 8.9 on 01/2015. Has not seen [place] for close f/up since then. On metformin 1000mg  bid and glipizide 5mg  daily. I want to increase his glipizide to 10 mg daily, however recently his crt was going up (likely from torsemide).  I will check a BMET today to f/up his kidney function. If it's normalizing then I will increase his glipizide. If worsening, may even have to d/c his metformin.

## 2015-11-21 NOTE — Assessment & Plan Note (Signed)
Filed Vitals:   11/21/15 1606  BP: 126/72  Pulse: 89  Temp: 98.2 F (36.8 C)  Resp: 18   BP well controlled.   lisinopril 20mg  daily, imdur 30 mg daily, hydral 100 mg tid, coreg 9.375mg  bid, spironolactone 25mg  daily, torsemide 80mg  daily.  meds are being managed by CHF clinic. has appt for lab f/up in 11/26/15. will defer further changes to them.

## 2015-11-22 LAB — BASIC METABOLIC PANEL
BUN/Creatinine Ratio: 21 — ABNORMAL HIGH (ref 8–19)
BUN: 32 mg/dL — AB (ref 6–20)
CALCIUM: 8.3 mg/dL — AB (ref 8.7–10.2)
CHLORIDE: 105 mmol/L (ref 96–106)
CO2: 24 mmol/L (ref 18–29)
Creatinine, Ser: 1.55 mg/dL — ABNORMAL HIGH (ref 0.76–1.27)
GFR, EST AFRICAN AMERICAN: 66 mL/min/{1.73_m2} (ref >59)
GFR, EST NON AFRICAN AMERICAN: 57 mL/min/{1.73_m2} — AB (ref >59)
Glucose: 86 mg/dL (ref 65–99)
Potassium: 4.6 mmol/L (ref 3.5–5.2)
Sodium: 144 mmol/L (ref 134–144)

## 2015-11-23 NOTE — Progress Notes (Signed)
Internal Medicine Clinic Attending  Case discussed with Dr. Ahmed at the time of the visit.  We reviewed the resident's history and exam and pertinent patient test results.  I agree with the assessment, diagnosis, and plan of care documented in the resident's note. 

## 2015-11-26 ENCOUNTER — Other Ambulatory Visit (HOSPITAL_COMMUNITY): Payer: No Typology Code available for payment source

## 2015-11-29 ENCOUNTER — Ambulatory Visit (INDEPENDENT_AMBULATORY_CARE_PROVIDER_SITE_OTHER): Payer: Self-pay | Admitting: Pharmacist

## 2015-11-29 DIAGNOSIS — E785 Hyperlipidemia, unspecified: Secondary | ICD-10-CM

## 2015-11-29 LAB — LIPID PANEL
Cholesterol: 180 mg/dL (ref 125–200)
HDL: 31 mg/dL — AB (ref >=40)
LDL CALC: 108 mg/dL (ref <130)
Total CHOL/HDL Ratio: 5.8 Ratio — ABNORMAL HIGH (ref <=5.0)
Triglycerides: 205 mg/dL — ABNORMAL HIGH (ref <150)
VLDL: 41 mg/dL — AB (ref <30)

## 2015-11-29 NOTE — Patient Instructions (Signed)
We will send the paperwork in for you to start Praluent injections.  We will call you once they are approved.   We will recheck your lipid panel today.

## 2015-11-29 NOTE — Progress Notes (Signed)
Chief Complaint  Patient presents with  . Hyperlipidemia      History of Present Illness: Jeremy [name] is a 34 y.o. male patient of Dr. 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  His PMH is significant for HTN, DM, and nonischemic cardiomyopathy.  He had a cath in May 2016 that showed normal coronaries.  He was last seen in [facility] last June.  We attempted to add Zetia but pt is uninsured and uses the [employer] for assistance.  They will cover Lipitor 80mg  but not Crestor 40mg .  He did not bring in his income information to help him apply for patient assistance with [employer] or [employer].    He states he has been compliant with his Lipitor and had no problems.   Since that visit, pt had another LDL check that was higher than before at 366 mg/dL.  This will change his [place] Criteria Score to a total of 9 (8 pts for LDL and 1 pt for family history of premature CAD).    Pt has a very strong family history of hyperlipidemia.  His father had an MI at 21 and his sister has a history of CAD at the age of 7.    Pt does not have any evidence of tendon xanthomas or corneal arcus on physical exam.    Pt states he tries to watch his diet some due to his diabetes.  He likes to eat fruit and drink water but admits to fast food or junk food at least every other day.  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  He is married and has a 59 year old son.   RF: DM, Family history (Dutch Score- 9)  LDL goal <70, non-HDL <100 Current Therapy: Lipitor 80mg  daily Intolerances: None  Labs:  04/2015 TC 458, TG 254, HDL 41, LDL 366 (Lipitor 80mg  daily) 02/2015: TC 404, TG 254, HDL 46, LDL 307 (lipitor 80mg  daily)     Past Medical History  Diagnosis Date  . Type II diabetes mellitus ([place])     a. 01/2015 HbA1c = 8.9.  . Essential hypertension   . Asthma   . Morbid obesity ([place])   . Chest pain     a. 01/2015 Lexiscan MV: EF 28%, inferior, inferolateral, apical ischemia;  b. 01/2015 Cath: nl  cors, PCWP 18 mmHg, CO 9.38 L/min, CI 3.53 L/min/m^2.  . Nonischemic cardiomyopathy ([place])     a. 01/2015 Echo: EF 20-25%, diff HK, Gr 2 DD, Triv AI, mildly dil LA and Ao root.  [name] Abscess of left groin   . Hyperlipidemia      Current Outpatient Prescriptions  Medication Sig Dispense Refill  . ASPIR-LOW 81 MG EC tablet TAKE 1 TABLET BY MOUTH DAILY. 30 tablet 11  . atorvastatin (LIPITOR) 80 MG tablet Take 1 tablet (80 mg total) by mouth daily. 90 tablet 2  . carvedilol (COREG) 6.25 MG tablet TAKE 1 & 1/2 TABLETS BY MOUTH 2 TIMES DAILY WITH A MEAL. 90 tablet 2  . doxycycline (DORYX) 100 MG EC tablet Take 1 tablet (100 mg total) by mouth 2 (two) times daily. 10 tablet 0  . glipiZIDE (GLUCOTROL) 5 MG tablet TAKE 1 TABLET BY MOUTH DAILY. 30 tablet PRN  . hydrALAZINE (APRESOLINE) 100 MG tablet Take 1 tablet (100 mg total) by mouth 3 (three) times daily. 90 tablet 3  . isosorbide mononitrate (IMDUR) 30 MG 24 hr tablet Take 1 tablet (30 mg total) by mouth daily. 30 tablet  3  . lisinopril (PRINIVIL,ZESTRIL) 20 MG tablet Take 1 tablet (20 mg total) by mouth daily. 30 tablet 3  . metFORMIN (GLUCOPHAGE) 1000 MG tablet Take 1 tablet (1,000 mg total) by mouth 2 (two) times daily with a meal. 60 tablet 0  . nitroGLYCERIN (NITROSTAT) 0.4 MG SL tablet Place 1 tablet (0.4 mg total) under the tongue every 5 (five) minutes as needed for chest pain. 25 tablet 3  . potassium chloride SA (K-DUR,KLOR-CON) 20 MEQ tablet Take 1 tablet (20 mEq total) by mouth daily. 90 tablet 3  . spironolactone (ALDACTONE) 25 MG tablet Take 1 tablet (25 mg total) by mouth daily. 90 tablet 3  . torsemide (DEMADEX) 20 MG tablet Take 4 tablets (80 mg total) by mouth daily. May take additional 20 -40 mg of torsemide prn weight gain 200 tablet 3   No current facility-administered medications for this visit.    Allergies:   Review of patient's allergies indicates no known allergies.    Social History:  The patient  reports that he has  never smoked. He does not have any smokeless tobacco history on file. He reports that he uses illicit drugs (Marijuana). He reports that he does not drink alcohol.   Family History:  The patient's family history includes CAD in his father and sister; Congestive Heart Failure in his father; Diabetes Mellitus II in his mother and sister; Gastric cancer in his mother; Heart attack in his father.     ASSESSMENT AND PLAN:  1.  Hyperlipidemia- Pt's LDL still extremely elevated after taking Lipitor 80mg  for several months.  Unfortunately he cannot get Crestor from his pharmacy at this time.  His [place] Criteria score is 9, which means he is a definite FH patient.  His only option for achieving at least 50% reduction in LDL is to consider PCSK-9 inhibitor.  Discussed with patient and wife.  They are willing to try this therapy.  Pt does not have insurance so will complete paperwork for assistance.    Signed, Aris Georgia, [address] [place], [place],   [address] Phone: [phone]; Fax: [phone]

## 2015-12-10 NOTE — Progress Notes (Signed)
Addendum:  Reviewed pt's labs from 3/23.  LDL improved to 108.  Given he is still primary prevention, will continue pt just on Lipitor 80mg  for now.  If LDL increases, will consider PCSK-9 inhibitor.  Pt aware of lab results.

## 2015-12-11 ENCOUNTER — Other Ambulatory Visit: Payer: Self-pay | Admitting: Internal Medicine

## 2015-12-11 ENCOUNTER — Other Ambulatory Visit (HOSPITAL_COMMUNITY): Payer: Self-pay | Admitting: Adult Health

## 2015-12-11 NOTE — Telephone Encounter (Signed)
As this patient was seen by Dr. Genene Churn on 11/21/15 and deemed necessary for their treatment as documented in the EHR, I will refill this prescription for metformin 1000mg  twice daily.  Most recent GFR was >60 but will make sure he has a follow-up appointment with me in late May or early June to reassess his A1c.

## 2015-12-24 MED FILL — metFORMIN HCL 1000 MG TABS: 1000 | 30 days supply | Qty: 60 | Fill #0

## 2015-12-24 MED FILL — KLOR-CON M20 TABLET: 20 | 30 days supply | Qty: 30 | Fill #5

## 2015-12-24 MED FILL — LISINOPRIL 20 MG TABLET: 20 | 30 days supply | Qty: 30 | Fill #0

## 2015-12-24 MED FILL — CARVEDILOL 6.25 MG TABLET: 6.25 | 30 days supply | Qty: 90 | Fill #2

## 2015-12-24 MED FILL — hydrALAZINE HCL 100 MG TABS: 100 | 30 days supply | Qty: 90 | Fill #0

## 2015-12-24 MED FILL — ISOSORBIDE MN ER 30 MG TAB: 30 | 30 days supply | Qty: 30 | Fill #0

## 2015-12-24 MED FILL — ATORVASTATIN 80 MG TABLET: 80 | 30 days supply | Qty: 30 | Fill #5

## 2015-12-24 MED FILL — SPIRONOLACTONE 25 MG TABLET: 25 | 30 days supply | Qty: 30 | Fill #5

## 2015-12-24 MED FILL — TORSEMIDE 20 MG TABLET: 20 | 33 days supply | Qty: 200 | Fill #1

## 2016-01-03 ENCOUNTER — Ambulatory Visit (HOSPITAL_BASED_OUTPATIENT_CLINIC_OR_DEPARTMENT_OTHER)
Admission: RE | Admit: 2016-01-03 | Discharge: 2016-01-03 | Disposition: A | Discharge status: Home / Self Care | Payer: Self-pay | Source: Ambulatory Visit | Attending: Cardiology | Admitting: Cardiology

## 2016-01-03 ENCOUNTER — Encounter (HOSPITAL_COMMUNITY): Payer: Self-pay

## 2016-01-03 ENCOUNTER — Ambulatory Visit (HOSPITAL_COMMUNITY)
Admission: RE | Admit: 2016-01-03 | Discharge: 2016-01-03 | Disposition: A | Discharge status: Home / Self Care | Payer: Self-pay | Source: Ambulatory Visit | Attending: Cardiology | Admitting: Cardiology

## 2016-01-03 VITALS — BP 115/73 | HR 86 | Ht 77.0 in | Wt 328.8 lb

## 2016-01-03 DIAGNOSIS — N189 Chronic kidney disease, unspecified: Secondary | ICD-10-CM | POA: Insufficient documentation

## 2016-01-03 DIAGNOSIS — I5042 Chronic combined systolic (congestive) and diastolic (congestive) heart failure: Secondary | ICD-10-CM

## 2016-01-03 DIAGNOSIS — I130 Hypertensive heart and chronic kidney disease with heart failure and stage 1 through stage 4 chronic kidney disease, or unspecified chronic kidney disease: Secondary | ICD-10-CM | POA: Insufficient documentation

## 2016-01-03 DIAGNOSIS — E785 Hyperlipidemia, unspecified: Secondary | ICD-10-CM | POA: Insufficient documentation

## 2016-01-03 DIAGNOSIS — E1122 Type 2 diabetes mellitus with diabetic chronic kidney disease: Secondary | ICD-10-CM | POA: Insufficient documentation

## 2016-01-03 DIAGNOSIS — Z8249 Family history of ischemic heart disease and other diseases of the circulatory system: Secondary | ICD-10-CM | POA: Insufficient documentation

## 2016-01-03 DIAGNOSIS — Z7982 Long term (current) use of aspirin: Secondary | ICD-10-CM | POA: Insufficient documentation

## 2016-01-03 DIAGNOSIS — I5022 Chronic systolic (congestive) heart failure: Secondary | ICD-10-CM | POA: Insufficient documentation

## 2016-01-03 DIAGNOSIS — I429 Cardiomyopathy, unspecified: Secondary | ICD-10-CM

## 2016-01-03 DIAGNOSIS — Z7984 Long term (current) use of oral hypoglycemic drugs: Secondary | ICD-10-CM | POA: Insufficient documentation

## 2016-01-03 DIAGNOSIS — Z79899 Other long term (current) drug therapy: Secondary | ICD-10-CM | POA: Insufficient documentation

## 2016-01-03 DIAGNOSIS — I428 Other cardiomyopathies: Secondary | ICD-10-CM | POA: Insufficient documentation

## 2016-01-03 DIAGNOSIS — R0683 Snoring: Secondary | ICD-10-CM

## 2016-01-03 LAB — BASIC METABOLIC PANEL
ANION GAP: 10 (ref 5–15)
BUN: 38 mg/dL — AB (ref 6–20)
CHLORIDE: 108 mmol/L (ref 101–111)
CO2: 26 mmol/L (ref 22–32)
Calcium: 9 mg/dL (ref 8.9–10.3)
Creatinine, Ser: 1.79 mg/dL — ABNORMAL HIGH (ref 0.61–1.24)
GFR calc Af Amer: 55 mL/min — ABNORMAL LOW (ref >60)
GFR, EST NON AFRICAN AMERICAN: 47 mL/min — AB (ref >60)
GLUCOSE: 108 mg/dL — AB (ref 65–99)
POTASSIUM: 4.4 mmol/L (ref 3.5–5.1)
Sodium: 144 mmol/L (ref 135–145)

## 2016-01-03 LAB — BRAIN NATRIURETIC PEPTIDE: B NATRIURETIC PEPTIDE 5: 20.2 pg/mL (ref 0.0–100.0)

## 2016-01-03 MED ORDER — TORSEMIDE 20 MG PO TABS: 60.0000 mg | ORAL_TABLET | Freq: Every day | ORAL | Status: DC

## 2016-01-03 NOTE — Progress Notes (Signed)
  Echocardiogram 2D Echocardiogram has been performed.  Jennette Dubin 01/03/2016, 2:54 PM

## 2016-01-03 NOTE — Progress Notes (Signed)
Patient ID: Jeremy [name], male   DOB: 05-02-1981, 35 y.o.   MRN: [id number]    Advanced Heart Failure Clinic Note   PCP: Dr. Randell Patient Primary cardiologist: Dr. Aundra Dubin  35 yo with history of nonischemic cardiomyopathy, HTN, and DM presents for cardiology followup.  In 4/16, patient was seen in the ER with edema and chest pain.  The only medication he was taking was metformin.  BP was elevated.  Echo showed EF 20-25%, and Cardiolite suggested ischemia.  He had RHC/LHC in 5/16.  This showed no coronary disease and mildly elevated filling pressures.    He presents today for HF follow up. Up 1 lb.  At last visit increased we increased hydralazine.  Has felt OK.  Thinks he may feel a little bit more fatigued. Tries yard work and chasing his 51 year old son around. Gets SOB up steps.  Can walk 50-75 yards prior to needing to stop.  Took a LOA from [employer] due to pedal edema (stands for ~10 hrs a day). Taking all medications as directed. Pees well on torsemide, doesn't take any extra. Missed sleep study last year and never rescheduled.  Echo done today was reviewed, EF up to 50% with moderate LVH.   Labs (6/16): LDL 308, HDL 46, TGs 284 Labs (7/16): K 3.5, creatinine 1.0 Labs 05/24/2015: K 3.9 Creatinine 1.43  Labs (3/17): creatinine 1.55, LDL 108, HDL 31  PMH: 1. Type II diabetes since 2006.  2. HTN since around 2010.   3. Asthma 4. Hyperlipidemia 5. Morbid obesity 6. Cardiomyopathy: Nonischemic.  Echo (5/16) with EF 20-25%, diffuse hypokinesis, grade II diastolic dysfunction.  Lexiscan Cardiolite (5/16) with EF 28%, inferior/inferolateral/apical ischemia.  RHC/LHC (5/16) with normal coronaries, mean RA 11, PA 33/21 mean 28, mean PCWP 18, CI 2.53.  - Echo (4/17): EF 50%, moderate LVH, normal RV size and systolic function.  7. ABIs normal 8/16.  8. CKD  SH: Lives with girlfriend, nonsmoker, no cocaine, occasional marijuana, no ETOH.  Works as Art gallery manager.  FH: Father with CHF, died at 36.   Sister with CHF, PAD.   ROS: All systems reviewed and negative except as per HPI.    Current Outpatient Prescriptions  Medication Sig Dispense Refill  . ASPIR-LOW 81 MG EC tablet TAKE 1 TABLET BY MOUTH DAILY. 30 tablet 11  . atorvastatin (LIPITOR) 80 MG tablet Take 1 tablet (80 mg total) by mouth daily. 90 tablet 2  . carvedilol (COREG) 6.25 MG tablet TAKE 1 & 1/2 TABLETS BY MOUTH 2 TIMES DAILY WITH A MEAL. 90 tablet 2  . glipiZIDE (GLUCOTROL) 5 MG tablet TAKE 1 TABLET BY MOUTH DAILY. 30 tablet PRN  . hydrALAZINE (APRESOLINE) 100 MG tablet Take 1 tablet (100 mg total) by mouth 3 (three) times daily. 90 tablet 3  . isosorbide mononitrate (IMDUR) 30 MG 24 hr tablet TAKE 1 TABLET BY MOUTH DAILY. 30 tablet 3  . lisinopril (PRINIVIL,ZESTRIL) 20 MG tablet TAKE 1 TABLET (20 MG TOTAL) BY MOUTH DAILY. 30 tablet 3  . metFORMIN (GLUCOPHAGE) 1000 MG tablet TAKE 1 TABLET (1,000 MG TOTAL) BY MOUTH 2 (TWO) TIMES DAILY WITH A MEAL. 60 tablet 5  . nitroGLYCERIN (NITROSTAT) 0.4 MG SL tablet Place 1 tablet (0.4 mg total) under the tongue every 5 (five) minutes as needed for chest pain. 25 tablet 3  . potassium chloride SA (K-DUR,KLOR-CON) 20 MEQ tablet Take 1 tablet (20 mEq total) by mouth daily. 90 tablet 3  . spironolactone (ALDACTONE) 25 MG tablet Take 1  tablet (25 mg total) by mouth daily. 90 tablet 3  . torsemide (DEMADEX) 20 MG tablet Take 4 tablets (80 mg total) by mouth daily. May take additional 20 -40 mg of torsemide prn weight gain 200 tablet 3   No current facility-administered medications for this encounter.   BP 115/73 mmHg  Pulse 86  Ht 6\' 5"  (1.956 m)  Wt 328 lb 12.8 oz (149.143 kg)  BMI 38.98 kg/m2  SpO2 100%   Wt Readings from Last 3 Encounters:  01/03/16 328 lb 12.8 oz (149.143 kg)  11/21/15 322 lb (146.058 kg)  11/15/15 327 lb 6.4 oz (148.508 kg)     General: NAD Neck: Thick, JVP 6-7 m, no thyromegaly or thyroid nodule noted. Wife present Lungs: Clear CV: Nondisplaced PMI.   Heart regular S1/S2, no S3/S4, no murmur. No carotid bruit.   Abdomen: Soft, NT, ND, no HSM. No bruits or masses. +BS  Skin: Intact without lesions or rashes.  Neurologic: Alert and oriented x 3.  Psych: Normal affect. Extremities: No clubbing or cyanosis. Trace ankle edema.   HEENT: Normal.   Assessment/Plan:  1. Chronic systolic CHF: Nonischemic cardiomyopathy. ?from long-standing HTN. ECHO 01/2015 EF 20-25%.  - Repeat Echo today with LVEF ~ 50% and LVH. Medicaid pending, has been a few months, but has not gotten a denial letter.  - Volume status stable.  Think most of his weight is adipose, and symptoms are from deconditioning 2/2 inactivity.  - Decrease torsemide to 60 mg daily. Can take extra in 20 mg in evenings as needed for weight gains of 3 lbs overnight or 5 lbs within week.  - Continue carvedilol 9.375 mg BID - Continue lisinopril 20 mg daily. - Continue hydralazine 100 mg tid and Imdur 30 mg daily.   2. Suspected OSA: Sleepy/fatigued during day.   - Reschedule sleep study.  Reinforced the importance.  3. . 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  LDL much better in 3/17, suspect related to compliance with atorvastatin.  - Continue atorvastatin 80 mg daily. 4. HTN: Much improved with increased hydralazine.  5. CKD: Cutting back on torsemide.   Check BMET/BNP today. Follow up 4 months.   Shirley Friar PA-C 01/03/2016   Patient seen with PA, agree with the above note.  I reviewed today's echo, EF 50% with moderate LVH.  This is improved.  I will have him continue current meds except decrease torsemide to 60 mg daily.  BMET/BNP today.    Followup in 4 months.   Loralie Champagne 01/04/2016

## 2016-01-03 NOTE — Patient Instructions (Signed)
Decrease Torsemide to 60 mg daily   Labs today  Your physician has recommended that you have a sleep study. This test records several body functions during sleep, including: brain activity, eye movement, oxygen and carbon dioxide blood levels, heart rate and rhythm, breathing rate and rhythm, the flow of air through your mouth and nose, snoring, body muscle movements, and chest and belly movement.  We will contact you in 4 months to schedule your next appointment.

## 2016-01-10 MED FILL — glipiZIDE 5 MG TABS: 5 | 30 days supply | Qty: 30 | Fill #3

## 2016-01-23 ENCOUNTER — Other Ambulatory Visit (HOSPITAL_COMMUNITY): Payer: Self-pay | Admitting: Internal Medicine

## 2016-01-23 MED FILL — ISOSORBIDE MN ER 30 MG TAB: 30 | 30 days supply | Qty: 30 | Fill #1

## 2016-01-23 MED FILL — TORSEMIDE 20 MG TABLET: 20 | 33 days supply | Qty: 200 | Fill #2

## 2016-01-23 MED FILL — SPIRONOLACTONE 25 MG TABLET: 25 | 30 days supply | Qty: 30 | Fill #6

## 2016-01-23 MED FILL — metFORMIN HCL 1000 MG TABS: 1000 | 30 days supply | Qty: 60 | Fill #1

## 2016-01-23 MED FILL — LISINOPRIL 20 MG TABLET: 20 | 30 days supply | Qty: 30 | Fill #1

## 2016-01-23 MED FILL — hydrALAZINE HCL 100 MG TABS: 100 | 30 days supply | Qty: 90 | Fill #1

## 2016-01-23 MED FILL — ATORVASTATIN 80 MG TABLET: 80 | 30 days supply | Qty: 30 | Fill #6

## 2016-01-23 MED FILL — KLOR-CON M20 TABLET: 20 | 30 days supply | Qty: 30 | Fill #6

## 2016-01-25 ENCOUNTER — Encounter: Payer: Self-pay | Admitting: Internal Medicine

## 2016-01-30 ENCOUNTER — Telehealth: Payer: Self-pay | Admitting: Internal Medicine

## 2016-01-30 NOTE — Telephone Encounter (Signed)
APT. REMINDER CALL, NO ANSWER, VOICEMAIL FULL °

## 2016-01-31 ENCOUNTER — Encounter: Payer: Self-pay | Admitting: Internal Medicine

## 2016-02-01 ENCOUNTER — Encounter: Payer: Self-pay | Admitting: Internal Medicine

## 2016-02-07 MED FILL — CARVEDILOL 6.25 MG TABLET: 6.25 | 30 days supply | Qty: 90 | Fill #0

## 2016-02-07 MED FILL — glipiZIDE 5 MG TABS: 5 | 30 days supply | Qty: 30 | Fill #4

## 2016-02-20 ENCOUNTER — Telehealth (HOSPITAL_COMMUNITY): Payer: Self-pay | Admitting: *Deleted

## 2016-02-20 MED ORDER — METOLAZONE 2.5 MG PO TABS: 2.5000 mg | ORAL_TABLET | ORAL | Status: DC

## 2016-02-20 MED FILL — TORSEMIDE 20 MG TABLET: 20 | 33 days supply | Qty: 200 | Fill #3

## 2016-02-20 MED FILL — ISOSORBIDE MN ER 30 MG TAB: 30 | 30 days supply | Qty: 30 | Fill #2

## 2016-02-20 MED FILL — ATORVASTATIN 80 MG TABLET: 80 | 30 days supply | Qty: 30 | Fill #7

## 2016-02-20 MED FILL — KLOR-CON M20 TABLET: 20 | 30 days supply | Qty: 30 | Fill #7

## 2016-02-20 MED FILL — metFORMIN HCL 1000 MG TABS: 1000 | 30 days supply | Qty: 60 | Fill #2

## 2016-02-20 MED FILL — metOLazone 2.5 MG TABS: 2.5 | 5 days supply | Qty: 5 | Fill #0

## 2016-02-20 MED FILL — LISINOPRIL 20 MG TABLET: 20 | 30 days supply | Qty: 30 | Fill #2

## 2016-02-20 MED FILL — SPIRONOLACTONE 25 MG TABLET: 25 | 30 days supply | Qty: 30 | Fill #7

## 2016-02-20 MED FILL — hydrALAZINE HCL 100 MG TABS: 100 | 30 days supply | Qty: 90 | Fill #2

## 2016-02-20 NOTE — Telephone Encounter (Signed)
Pt's friend to call report he has been having swelling to legs since Saturday, she reports he increased his Torsemide to 80 mg daily on Sunday with not much help.  She states swelling is half way up calf, denies SOB or abd distention.  They do not have a scale so pt has not been able to weigh.  Discussed w/Amy Ninfa Meeker, NP she would like pt to take metolazone 2.5 mg for 1 dose and reduce Tor back to 60 mg daily.   Attempted to call back w/instructionas and Left message to call back

## 2016-02-20 NOTE — Telephone Encounter (Signed)
Pt's friend is aware and agreeable, rx sent in

## 2016-02-25 ENCOUNTER — Ambulatory Visit (HOSPITAL_BASED_OUTPATIENT_CLINIC_OR_DEPARTMENT_OTHER): Payer: Self-pay | Attending: Cardiology

## 2016-02-26 ENCOUNTER — Telehealth (HOSPITAL_COMMUNITY): Payer: Self-pay | Admitting: *Deleted

## 2016-02-26 NOTE — Telephone Encounter (Signed)
Pt took the metolazone as directed last week but it did not help the swelling at all, sch to see NP tomorrow for eval and labs

## 2016-02-27 ENCOUNTER — Ambulatory Visit (HOSPITAL_COMMUNITY)
Admission: RE | Admit: 2016-02-27 | Discharge: 2016-02-27 | Disposition: A | Discharge status: Home / Self Care | Payer: Self-pay | Source: Ambulatory Visit | Attending: Cardiology | Admitting: Cardiology

## 2016-02-27 VITALS — BP 152/98 | HR 78 | Wt 335.0 lb

## 2016-02-27 DIAGNOSIS — E785 Hyperlipidemia, unspecified: Secondary | ICD-10-CM | POA: Insufficient documentation

## 2016-02-27 DIAGNOSIS — Z79899 Other long term (current) drug therapy: Secondary | ICD-10-CM | POA: Insufficient documentation

## 2016-02-27 DIAGNOSIS — Z8249 Family history of ischemic heart disease and other diseases of the circulatory system: Secondary | ICD-10-CM | POA: Insufficient documentation

## 2016-02-27 DIAGNOSIS — J45909 Unspecified asthma, uncomplicated: Secondary | ICD-10-CM | POA: Insufficient documentation

## 2016-02-27 DIAGNOSIS — E669 Obesity, unspecified: Secondary | ICD-10-CM

## 2016-02-27 DIAGNOSIS — N189 Chronic kidney disease, unspecified: Secondary | ICD-10-CM | POA: Insufficient documentation

## 2016-02-27 DIAGNOSIS — Z7982 Long term (current) use of aspirin: Secondary | ICD-10-CM | POA: Insufficient documentation

## 2016-02-27 DIAGNOSIS — Z7984 Long term (current) use of oral hypoglycemic drugs: Secondary | ICD-10-CM | POA: Insufficient documentation

## 2016-02-27 DIAGNOSIS — E1122 Type 2 diabetes mellitus with diabetic chronic kidney disease: Secondary | ICD-10-CM | POA: Insufficient documentation

## 2016-02-27 DIAGNOSIS — I5022 Chronic systolic (congestive) heart failure: Secondary | ICD-10-CM | POA: Insufficient documentation

## 2016-02-27 DIAGNOSIS — I429 Cardiomyopathy, unspecified: Secondary | ICD-10-CM

## 2016-02-27 DIAGNOSIS — I10 Essential (primary) hypertension: Secondary | ICD-10-CM

## 2016-02-27 DIAGNOSIS — I428 Other cardiomyopathies: Secondary | ICD-10-CM | POA: Insufficient documentation

## 2016-02-27 DIAGNOSIS — I130 Hypertensive heart and chronic kidney disease with heart failure and stage 1 through stage 4 chronic kidney disease, or unspecified chronic kidney disease: Secondary | ICD-10-CM | POA: Insufficient documentation

## 2016-02-27 DIAGNOSIS — I5042 Chronic combined systolic (congestive) and diastolic (congestive) heart failure: Secondary | ICD-10-CM

## 2016-02-27 DIAGNOSIS — Z6837 Body mass index (BMI) 37.0-37.9, adult: Secondary | ICD-10-CM | POA: Insufficient documentation

## 2016-02-27 LAB — BASIC METABOLIC PANEL
ANION GAP: 6 (ref 5–15)
BUN: 34 mg/dL — ABNORMAL HIGH (ref 6–20)
CO2: 27 mmol/L (ref 22–32)
Calcium: 8.8 mg/dL — ABNORMAL LOW (ref 8.9–10.3)
Chloride: 109 mmol/L (ref 101–111)
Creatinine, Ser: 1.67 mg/dL — ABNORMAL HIGH (ref 0.61–1.24)
GFR calc Af Amer: 60 mL/min — ABNORMAL LOW (ref >60)
GFR, EST NON AFRICAN AMERICAN: 52 mL/min — AB (ref >60)
Glucose, Bld: 156 mg/dL — ABNORMAL HIGH (ref 65–99)
POTASSIUM: 3.8 mmol/L (ref 3.5–5.1)
SODIUM: 142 mmol/L (ref 135–145)

## 2016-02-27 LAB — BRAIN NATRIURETIC PEPTIDE: B NATRIURETIC PEPTIDE 5: 17.3 pg/mL (ref 0.0–100.0)

## 2016-02-27 MED ORDER — TORSEMIDE 20 MG PO TABS: 80.0000 mg | ORAL_TABLET | Freq: Every day | ORAL | Status: DC

## 2016-02-27 NOTE — Patient Instructions (Signed)
INCREASE Torsemide to 80 mg, daily, you may take an additional 20 mg in the PM as needed for weight gain ( 3 lb overnight or 5 lbs in a week) Continue Metolazone as needed for weight gain/SOB  Labs today  Your physician recommends that you schedule a follow-up appointment in: 3 months In the [place] the following things EVERYDAY: 1) Weigh yourself in the morning before breakfast. Write it down and keep it in a log. 2) Take your medicines as prescribed 3) Eat low salt foods-Limit salt (sodium) to 2000 mg per day.  4) Stay as active as you can everyday 5) Limit all fluids for the day to less than 2 liters 6)

## 2016-02-27 NOTE — Progress Notes (Signed)
Patient ID: Jeremy [name], male   DOB: [birth year], 35 y.o.   MRN: [id number]    Advanced Heart Failure Clinic Note   PCP: Dr. Randell Patient Primary cardiologist: Dr. Aundra Dubin  35 yo with history of nonischemic cardiomyopathy, HTN, and DM presents for cardiology followup.  In 4/16, patient was seen in the ER with edema and chest pain.  The only medication he was taking was metformin.  BP was elevated.  Echo showed EF 20-25%, and Cardiolite suggested ischemia.  He had RHC/LHC in 5/16.  This showed no coronary disease and mildly elevated filling pressures.    He presents today for HF follow up. Over the last few days he has had increased leg edema. Yesterday SOB with steps. Able to walk around the grocery store.  Weight at home 328-335 pounds but says he doesn't think scales are working right. Drinking > 2 liters.  Taking all medications.  Wears ted hose.  Plans to restart [facility] in the fall.   Labs (6/16): LDL 308, HDL 46, TGs 284 Labs (7/16): K 3.5, creatinine 1.0 Labs 05/24/2015: K 3.9 Creatinine 1.43  Labs (3/17): creatinine 1.55, LDL 108, HDL 31  PMH: 1. Type II diabetes since 2006.  2. HTN since around 2010.   3. Asthma 4. Hyperlipidemia 5. Morbid obesity 6. Cardiomyopathy: Nonischemic.  Echo (5/16) with EF 20-25%, diffuse hypokinesis, grade II diastolic dysfunction.  Lexiscan Cardiolite (5/16) with EF 28%, inferior/inferolateral/apical ischemia.  RHC/LHC (5/16) with normal coronaries, mean RA 11, PA 33/21 mean 28, mean PCWP 18, CI 2.53.  - Echo (4/17): EF 50%, moderate LVH, normal RV size and systolic function. Grade IDD  7. ABIs normal 8/16.  8. CKD  SH: Lives with girlfriend, nonsmoker, no cocaine, occasional marijuana, no ETOH.  Works as Art gallery manager.  FH: Father with CHF, died at 14.  Sister with CHF, PAD.   ROS: All systems reviewed and negative except as per HPI.    Current Outpatient Prescriptions  Medication Sig Dispense Refill  . ASPIR-LOW 81 MG EC tablet TAKE 1 TABLET BY MOUTH  DAILY. 30 tablet 11  . atorvastatin (LIPITOR) 80 MG tablet Take 1 tablet (80 mg total) by mouth daily. 90 tablet 2  . carvedilol (COREG) 6.25 MG tablet TAKE 1 & 1/2 TABLETS BY MOUTH TWICE DAILY WITH MEALS 90 tablet 2  . glipiZIDE (GLUCOTROL) 5 MG tablet TAKE 1 TABLET BY MOUTH DAILY. 30 tablet PRN  . isosorbide mononitrate (IMDUR) 30 MG 24 hr tablet TAKE 1 TABLET BY MOUTH DAILY. 30 tablet 3  . lisinopril (PRINIVIL,ZESTRIL) 20 MG tablet TAKE 1 TABLET (20 MG TOTAL) BY MOUTH DAILY. 30 tablet 3  . metFORMIN (GLUCOPHAGE) 1000 MG tablet TAKE 1 TABLET (1,000 MG TOTAL) BY MOUTH 2 (TWO) TIMES DAILY WITH A MEAL. 60 tablet 5  . metolazone (ZAROXOLYN) 2.5 MG tablet Take 1 tablet (2.5 mg total) by mouth as directed. 5 tablet 0  . nitroGLYCERIN (NITROSTAT) 0.4 MG SL tablet Place 1 tablet (0.4 mg total) under the tongue every 5 (five) minutes as needed for chest pain. 25 tablet 3  . potassium chloride SA (K-DUR,KLOR-CON) 20 MEQ tablet Take 1 tablet (20 mEq total) by mouth daily. 90 tablet 3  . spironolactone (ALDACTONE) 25 MG tablet Take 1 tablet (25 mg total) by mouth daily. 90 tablet 3  . torsemide (DEMADEX) 20 MG tablet Take 3 tablets (60 mg total) by mouth daily. May take additional 20 -40 mg of torsemide prn weight gain 200 tablet 3  . hydrALAZINE (APRESOLINE) 100  MG tablet Take 1 tablet (100 mg total) by mouth 3 (three) times daily. 90 tablet 3   No current facility-administered medications for this encounter.   BP 152/98 mmHg  Pulse 78  Wt 335 lb (151.955 kg)  SpO2 99%   Wt Readings from Last 3 Encounters:  02/27/16 335 lb (151.955 kg)  01/03/16 328 lb 12.8 oz (149.143 kg)  11/21/15 322 lb (146.058 kg)     General: NAD Neck: Thick, JVP 9-10 m, no thyromegaly or thyroid nodule noted. Wife present Lungs: Clear CV: Nondisplaced PMI.  Heart regular S1/S2, no S3/S4, no murmur. No carotid bruit.   Abdomen: Soft, NT, ND, no HSM. No bruits or masses. +BS  Skin: Intact without lesions or rashes.    Neurologic: Alert and oriented x 3.  Psych: Normal affect. Extremities: No clubbing or cyanosis. Trace-1+ edema.   HEENT: Normal.   Assessment/Plan:  1. Chronic systolic CHF: Nonischemic cardiomyopathy. ?from long-standing HTN. ECHO 01/2015 EF 20-25%.  - Repeat Echo with LVEF ~ 50% and LVH.  NYHA II-III - Volume status mildly elevated likely due to high salt diet and excess fluid intake . Increase torsemide to 80 mg daily. Continue 20 meq potassium daily. Can take an extra 20 mg of torsemide if needed.   - Continue carvedilol 9.375 mg BID - Continue lisinopril 20 mg daily. - Continue hydralazine 100 mg tid and Imdur 30 mg daily.   Today I reinforced low salt diet and limiting fluid intake to < 2 liters per day. Also discussed daily weights.  2. Suspected OSA: - Reschedule sleep study.   3. . 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  LDL much better in 3/17, suspect related to compliance with atorvastatin.  - Continue atorvastatin 80 mg daily. 4. HTN: Elevated as above increasing diuretics.    5. CKD: BMET today.   Check BMET/BNP today. Follow up 3 months.   Amy Clegg NP-C  02/27/2016

## 2016-03-13 MED FILL — glipiZIDE 5 MG TABS: 5 | 30 days supply | Qty: 30 | Fill #5

## 2016-03-13 MED FILL — CARVEDILOL 6.25 MG TABLET: 6.25 | 30 days supply | Qty: 90 | Fill #1

## 2016-03-13 MED FILL — ATORVASTATIN 80 MG TABLET: 80 | 30 days supply | Qty: 30 | Fill #8

## 2016-03-13 MED FILL — TORSEMIDE 20 MG TABLET: 20 | 40 days supply | Qty: 200 | Fill #0

## 2016-03-13 MED FILL — ISOSORBIDE MN ER 30 MG TAB: 30 | 30 days supply | Qty: 30 | Fill #3

## 2016-03-13 MED FILL — metFORMIN HCL 1000 MG TABS: 1000 | 30 days supply | Qty: 60 | Fill #3

## 2016-03-13 MED FILL — LISINOPRIL 20 MG TABLET: 20 | 30 days supply | Qty: 30 | Fill #3

## 2016-03-13 MED FILL — SPIRONOLACTONE 25 MG TABLET: 25 | 30 days supply | Qty: 30 | Fill #8

## 2016-03-13 MED FILL — KLOR-CON M20 TABLET: 20 | 30 days supply | Qty: 30 | Fill #8

## 2016-03-13 MED FILL — hydrALAZINE HCL 100 MG TABS: 100 | 30 days supply | Qty: 90 | Fill #3

## 2016-03-21 ENCOUNTER — Encounter: Payer: Self-pay | Admitting: Internal Medicine

## 2016-04-06 ENCOUNTER — Telehealth: Payer: Self-pay | Admitting: Pulmonary Disease

## 2016-04-06 DIAGNOSIS — E785 Hyperlipidemia, unspecified: Secondary | ICD-10-CM

## 2016-04-06 DIAGNOSIS — I5042 Chronic combined systolic (congestive) and diastolic (congestive) heart failure: Secondary | ICD-10-CM
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
## 2016-04-07 NOTE — Telephone Encounter (Addendum)
Thank you for the heads up!

## 2016-04-08 NOTE — Addendum Note (Signed)
Addended by: [name] on: 04/08/2016 09:21 PM   Modules accepted: Orders

## 2016-04-11 ENCOUNTER — Ambulatory Visit (INDEPENDENT_AMBULATORY_CARE_PROVIDER_SITE_OTHER): Payer: Self-pay | Admitting: Internal Medicine

## 2016-04-11 ENCOUNTER — Encounter: Payer: Self-pay | Admitting: Internal Medicine

## 2016-04-11 VITALS — BP 129/76 | HR 90 | Temp 97.8°F | Ht 77.0 in | Wt 341.5 lb

## 2016-04-11 DIAGNOSIS — Z9189 Other specified personal risk factors, not elsewhere classified: Secondary | ICD-10-CM

## 2016-04-11 DIAGNOSIS — E119 Type 2 diabetes mellitus without complications: Secondary | ICD-10-CM

## 2016-04-11 DIAGNOSIS — Z7984 Long term (current) use of oral hypoglycemic drugs: Secondary | ICD-10-CM

## 2016-04-11 LAB — POCT GLYCOSYLATED HEMOGLOBIN (HGB A1C): Hemoglobin A1C: 7.2

## 2016-04-11 LAB — GLUCOSE, CAPILLARY: GLUCOSE-CAPILLARY: 110 mg/dL — AB (ref 65–99)

## 2016-04-11 NOTE — Patient Instructions (Signed)
Keep up the good work with your diabetes. Let's see each other back in November.  Once you get your orange card, please come back in to get your pneumonia shot.

## 2016-04-11 NOTE — Progress Notes (Signed)
   CC: Diabetes  HPI:  [name] is a 35 y.o. male who presents today for diabetes. Please see assessment & plan for status of chronic medical problems.    Past Medical History:  Diagnosis Date  . Abscess of left groin   . Asthma   . Chest pain    a. 01/2015 Lexiscan MV: EF 28%, inferior, inferolateral, apical ischemia;  b. 01/2015 Cath: nl cors, PCWP 18 mmHg, CO 9.38 L/min, CI 3.53 L/min/m^2.  . Essential hypertension   . Hyperlipidemia   . Morbid obesity ([place])   . Nonischemic cardiomyopathy ([place])    a. 01/2015 Echo: EF 20-25%, diff HK, Gr 2 DD, Triv AI, mildly dil LA and Ao root.  . Type II diabetes mellitus ([place])    a. 01/2015 HbA1c = 8.9.    Review of Systems:  Please see each problem below for a pertinent review of systems.   Physical Exam:  Vitals:   04/11/16 1515  BP: 129/76  Pulse: 90  Temp: 97.8 F (36.6 C)  TempSrc: Oral  SpO2: 100%  Weight: (!) 341 lb 8 oz (154.9 kg)  Height: 6\' 5"  (1.956 m)   Constitutional: Obese, African-American male. No distress.  HEENT: Normocephalic and atraumatic. Conjunctivae are normal.  Cardiovascular: Normal rate, regular rhythm and normal heart sounds.  No gallop, friction rub, murmur heard. Pulmonary/Chest: Effort normal. No respiratory distress. No wheezes, rales.  Abdominal: Soft. Bowel sounds are normal. No distension. No tenderness.  Neurological: Alert and oriented to person, place, and time. Coordination normal.  Skin: Warm and dry. Not diaphoretic.             Assessment & Plan:   See Encounters Tab for problem based charting.  Patient discussed with Dr. Eppie Gibson

## 2016-04-12 ENCOUNTER — Encounter: Payer: Self-pay | Admitting: Internal Medicine

## 2016-04-12 DIAGNOSIS — Z9189 Other specified personal risk factors, not elsewhere classified: Secondary | ICD-10-CM | POA: Insufficient documentation

## 2016-04-12 NOTE — Assessment & Plan Note (Addendum)
Assessment His A1c at last visit was 7.8. He currently takes metformin 1000 mg twice daily and glipizide 5mg . He was told he would need to have his diabetic medications changed if his renal function were to deteriorate, but per the last BMET in June, it appears his GFR is at least 30. It is difficult for him to ascertain if he has symptoms of hyperglycemia, like polydipsia, polyuria, as he takes diuretics for his congestive heart failure. He does check his sugars and acknowledges that they have remained mostly below 200.  He has undergone retinopathy screening the last 6-12 months from an eye doctor on [address] whose name he cannot recall at the moment.  Plan -Check A1c today -Perform on exam today -Request that he ask his eye doctor to fax over the records to our office -Continue glipizide 5mg  and metformin 1000mg  twice daily -Counseled him on the significance is the A1c as a surrogate marker for glycemic control over the last 3 months and the importance of maintaining an A1c close to 7 given his age, good functional status to avoid end-organ damage: neuropathy, retinopathy, nephropathy  ADDENDUM 04/12/2016  4:04 PM:  A1c 7.2, improved from 7.8 back in March 2017.

## 2016-04-12 NOTE — Assessment & Plan Note (Signed)
Assessment Given his comorbid diabetes, a 23 valent pneumococcal vaccine is indicated. He is currently without coverage and is due to renew his [facility] card.  Plan -Order 23 valent pneumococcal vaccine with plan to return for vaccination only visit

## 2016-04-14 MED FILL — glipiZIDE 5 MG TABS: 5 | 30 days supply | Qty: 30 | Fill #6

## 2016-04-14 MED FILL — SPIRONOLACTONE 25 MG TABLET: 25 | 30 days supply | Qty: 30 | Fill #9

## 2016-04-14 MED FILL — CARVEDILOL 6.25 MG TABLET: 6.25 | 30 days supply | Qty: 90 | Fill #2

## 2016-04-17 ENCOUNTER — Other Ambulatory Visit (HOSPITAL_COMMUNITY): Payer: Self-pay | Admitting: *Deleted

## 2016-04-17 ENCOUNTER — Ambulatory Visit: Payer: Self-pay

## 2016-04-17 DIAGNOSIS — I5042 Chronic combined systolic (congestive) and diastolic (congestive) heart failure: Secondary | ICD-10-CM

## 2016-04-17 DIAGNOSIS — E785 Hyperlipidemia, unspecified: Secondary | ICD-10-CM

## 2016-04-17 MED ORDER — ATORVASTATIN CALCIUM 80 MG PO TABS: 80.0000 mg | ORAL_TABLET | Freq: Every day | ORAL | 2 refills | Status: DC

## 2016-04-17 MED ORDER — LISINOPRIL 20 MG PO TABS: ORAL_TABLET | ORAL | 3 refills | Status: DC

## 2016-04-17 MED ORDER — HYDRALAZINE HCL 100 MG PO TABS: 100.0000 mg | ORAL_TABLET | Freq: Three times a day (TID) | ORAL | 3 refills | Status: DC

## 2016-04-17 MED ORDER — ISOSORBIDE MONONITRATE ER 30 MG PO TB24: 30.0000 mg | ORAL_TABLET | Freq: Every day | ORAL | 3 refills | Status: DC

## 2016-04-21 ENCOUNTER — Telehealth: Payer: Self-pay | Admitting: Internal Medicine

## 2016-04-21 NOTE — Progress Notes (Signed)
Patient ID: Jeremy [name], male   DOB: [birth year], 35 y.o.   MRN: [id number]  Case discussed with Dr. Posey Pronto at the time of the visit.  We reviewed the resident's history and exam and pertinent patient test results.  I agree with the assessment, diagnosis and plan of care documented in the resident's note.

## 2016-04-22 ENCOUNTER — Ambulatory Visit: Payer: Self-pay

## 2016-04-30 MED FILL — ISOSORBIDE MN ER 30 MG TAB: 30 | 30 days supply | Qty: 30 | Fill #0

## 2016-04-30 MED FILL — metFORMIN HCL 1000 MG TABS: 1000 | 30 days supply | Qty: 60 | Fill #4

## 2016-04-30 MED FILL — LISINOPRIL 20 MG TABLET: 20 | 30 days supply | Qty: 30 | Fill #0

## 2016-04-30 MED FILL — ATORVASTATIN 80 MG TABLET: 80 | 30 days supply | Qty: 30 | Fill #0

## 2016-04-30 MED FILL — hydrALAZINE HCL 100 MG TABS: 100 | 30 days supply | Qty: 90 | Fill #0

## 2016-05-06 MED FILL — KLOR-CON M20 TABLET: 20 | 30 days supply | Qty: 30 | Fill #9

## 2016-05-13 ENCOUNTER — Other Ambulatory Visit (HOSPITAL_COMMUNITY): Payer: Self-pay | Admitting: *Deleted

## 2016-05-13 MED ORDER — CARVEDILOL 6.25 MG PO TABS: ORAL_TABLET | ORAL | 2 refills | Status: DC

## 2016-05-13 MED FILL — CARVEDILOL 6.25 MG TABLET: 6.25 | 30 days supply | Qty: 90 | Fill #0

## 2016-05-13 MED FILL — SPIRONOLACTONE 25 MG TABLET: 25 | 30 days supply | Qty: 30 | Fill #10

## 2016-05-15 ENCOUNTER — Telehealth (HOSPITAL_COMMUNITY): Payer: Self-pay | Admitting: *Deleted

## 2016-05-15 MED ORDER — METOLAZONE 2.5 MG PO TABS: 2.5000 mg | ORAL_TABLET | ORAL | 2 refills | Status: DC

## 2016-05-15 NOTE — Telephone Encounter (Signed)
Pt's girlfriend called to request a refill on Metolazone.  She states pt has some swelling in his LE and maybe a little in his abd.  She states metolazone has helped in the past but he is out of it now and needs a new rx.  Refill sent to the pharmacy, advised if it is not helping to call us back, she is agreeable

## 2016-05-16 MED FILL — metOLazone 2.5 MG TABS: 2.5 | 5 days supply | Qty: 5 | Fill #0

## 2016-05-19 MED FILL — glipiZIDE 5 MG TABS: 5 | 30 days supply | Qty: 30 | Fill #7

## 2016-05-23 MED FILL — LISINOPRIL 20 MG TABLET: 20 | 30 days supply | Qty: 30 | Fill #1

## 2016-05-29 ENCOUNTER — Telehealth (HOSPITAL_COMMUNITY): Payer: Self-pay

## 2016-05-29 ENCOUNTER — Inpatient Hospital Stay (HOSPITAL_COMMUNITY): Payer: Self-pay

## 2016-05-29 ENCOUNTER — Ambulatory Visit (HOSPITAL_COMMUNITY)
Admission: RE | Admit: 2016-05-29 | Discharge: 2016-05-29 | Disposition: A | Discharge status: Home / Self Care | Payer: Self-pay | Source: Ambulatory Visit | Attending: Cardiology | Admitting: Cardiology

## 2016-05-29 ENCOUNTER — Encounter (HOSPITAL_COMMUNITY): Payer: Self-pay | Admitting: *Deleted

## 2016-05-29 ENCOUNTER — Encounter (HOSPITAL_COMMUNITY): Payer: Self-pay

## 2016-05-29 ENCOUNTER — Inpatient Hospital Stay (HOSPITAL_COMMUNITY)
Admission: AD | Admit: 2016-05-29 | Discharge: 2016-06-03 | DRG: 291 | Disposition: A | Discharge status: Home / Self Care | Payer: Self-pay | Source: Ambulatory Visit | Attending: Cardiology | Admitting: Cardiology

## 2016-05-29 VITALS — BP 190/104 | HR 100 | Resp 20 | Wt 358.0 lb

## 2016-05-29 DIAGNOSIS — E876 Hypokalemia: Secondary | ICD-10-CM | POA: Diagnosis not present

## 2016-05-29 DIAGNOSIS — I428 Other cardiomyopathies: Secondary | ICD-10-CM | POA: Diagnosis present

## 2016-05-29 DIAGNOSIS — Z7982 Long term (current) use of aspirin: Secondary | ICD-10-CM

## 2016-05-29 DIAGNOSIS — I160 Hypertensive urgency: Secondary | ICD-10-CM | POA: Diagnosis present

## 2016-05-29 DIAGNOSIS — N179 Acute kidney failure, unspecified: Secondary | ICD-10-CM

## 2016-05-29 DIAGNOSIS — N170 Acute kidney failure with tubular necrosis: Secondary | ICD-10-CM | POA: Diagnosis present

## 2016-05-29 DIAGNOSIS — I5042 Chronic combined systolic (congestive) and diastolic (congestive) heart failure: Secondary | ICD-10-CM

## 2016-05-29 DIAGNOSIS — E8809 Other disorders of plasma-protein metabolism, not elsewhere classified: Secondary | ICD-10-CM | POA: Diagnosis present

## 2016-05-29 DIAGNOSIS — I130 Hypertensive heart and chronic kidney disease with heart failure and stage 1 through stage 4 chronic kidney disease, or unspecified chronic kidney disease: Secondary | ICD-10-CM | POA: Insufficient documentation

## 2016-05-29 DIAGNOSIS — I5023 Acute on chronic systolic (congestive) heart failure: Secondary | ICD-10-CM

## 2016-05-29 DIAGNOSIS — R3129 Other microscopic hematuria: Secondary | ICD-10-CM | POA: Diagnosis present

## 2016-05-29 DIAGNOSIS — Z7984 Long term (current) use of oral hypoglycemic drugs: Secondary | ICD-10-CM

## 2016-05-29 DIAGNOSIS — N19 Unspecified kidney failure: Secondary | ICD-10-CM

## 2016-05-29 DIAGNOSIS — N049 Nephrotic syndrome with unspecified morphologic changes: Secondary | ICD-10-CM | POA: Diagnosis present

## 2016-05-29 DIAGNOSIS — D631 Anemia in chronic kidney disease: Secondary | ICD-10-CM | POA: Diagnosis present

## 2016-05-29 DIAGNOSIS — I5043 Acute on chronic combined systolic (congestive) and diastolic (congestive) heart failure: Secondary | ICD-10-CM | POA: Diagnosis present

## 2016-05-29 DIAGNOSIS — I5022 Chronic systolic (congestive) heart failure: Secondary | ICD-10-CM | POA: Insufficient documentation

## 2016-05-29 DIAGNOSIS — I169 Hypertensive crisis, unspecified: Secondary | ICD-10-CM | POA: Diagnosis present

## 2016-05-29 DIAGNOSIS — Z6841 Body Mass Index (BMI) 40.0 and over, adult: Secondary | ICD-10-CM

## 2016-05-29 DIAGNOSIS — R0600 Dyspnea, unspecified: Secondary | ICD-10-CM

## 2016-05-29 DIAGNOSIS — N183 Chronic kidney disease, stage 3 (moderate): Secondary | ICD-10-CM | POA: Diagnosis present

## 2016-05-29 DIAGNOSIS — I5033 Acute on chronic diastolic (congestive) heart failure: Secondary | ICD-10-CM

## 2016-05-29 DIAGNOSIS — E785 Hyperlipidemia, unspecified: Secondary | ICD-10-CM | POA: Insufficient documentation

## 2016-05-29 DIAGNOSIS — E1122 Type 2 diabetes mellitus with diabetic chronic kidney disease: Secondary | ICD-10-CM | POA: Diagnosis present

## 2016-05-29 DIAGNOSIS — Z79899 Other long term (current) drug therapy: Secondary | ICD-10-CM

## 2016-05-29 DIAGNOSIS — N184 Chronic kidney disease, stage 4 (severe): Secondary | ICD-10-CM

## 2016-05-29 DIAGNOSIS — D509 Iron deficiency anemia, unspecified: Secondary | ICD-10-CM | POA: Diagnosis present

## 2016-05-29 DIAGNOSIS — N185 Chronic kidney disease, stage 5: Secondary | ICD-10-CM

## 2016-05-29 DIAGNOSIS — D649 Anemia, unspecified: Secondary | ICD-10-CM | POA: Insufficient documentation

## 2016-05-29 DIAGNOSIS — G4733 Obstructive sleep apnea (adult) (pediatric): Secondary | ICD-10-CM | POA: Diagnosis present

## 2016-05-29 LAB — IRON AND TIBC
Iron: 30 ug/dL — ABNORMAL LOW (ref 45–182)
SATURATION RATIOS: 26 % (ref 17.9–39.5)
TIBC: 116 ug/dL — AB (ref 250–450)
UIBC: 86 ug/dL

## 2016-05-29 LAB — URINALYSIS, ROUTINE W REFLEX MICROSCOPIC
Bilirubin Urine: NEGATIVE
Glucose, UA: 100 mg/dL — AB
KETONES UR: NEGATIVE mg/dL
LEUKOCYTES UA: NEGATIVE
Nitrite: NEGATIVE
Specific Gravity, Urine: 1.018 (ref 1.005–1.030)
pH: 7 (ref 5.0–8.0)

## 2016-05-29 LAB — URINE MICROSCOPIC-ADD ON

## 2016-05-29 LAB — BASIC METABOLIC PANEL
Anion gap: 9 (ref 5–15)
BUN: 40 mg/dL — AB (ref 6–20)
CHLORIDE: 112 mmol/L — AB (ref 101–111)
CO2: 22 mmol/L (ref 22–32)
Calcium: 8 mg/dL — ABNORMAL LOW (ref 8.9–10.3)
Creatinine, Ser: 3.69 mg/dL — ABNORMAL HIGH (ref 0.61–1.24)
GFR calc Af Amer: 23 mL/min — ABNORMAL LOW (ref >60)
GFR calc non Af Amer: 20 mL/min — ABNORMAL LOW (ref >60)
GLUCOSE: 106 mg/dL — AB (ref 65–99)
POTASSIUM: 4 mmol/L (ref 3.5–5.1)
Sodium: 143 mmol/L (ref 135–145)

## 2016-05-29 LAB — CARBOXYHEMOGLOBIN
CARBOXYHEMOGLOBIN: 1.3 % (ref 0.5–1.5)
METHEMOGLOBIN: 1.3 % (ref 0.0–1.5)
O2 SAT: 74.2 %
Total hemoglobin: 7.8 g/dL — ABNORMAL LOW (ref 12.0–16.0)

## 2016-05-29 LAB — CBC
HEMATOCRIT: 26.9 % — AB (ref 39.0–52.0)
HEMOGLOBIN: 8.3 g/dL — AB (ref 13.0–17.0)
MCH: 25.1 pg — AB (ref 26.0–34.0)
MCHC: 30.9 g/dL (ref 30.0–36.0)
MCV: 81.3 fL (ref 78.0–100.0)
Platelets: 374 10*3/uL (ref 150–400)
RBC: 3.31 MIL/uL — ABNORMAL LOW (ref 4.22–5.81)
RDW: 15.2 % (ref 11.5–15.5)
WBC: 9.4 10*3/uL (ref 4.0–10.5)

## 2016-05-29 LAB — VITAMIN B12: Vitamin B-12: 331 pg/mL (ref 180–914)

## 2016-05-29 LAB — GLUCOSE, CAPILLARY: GLUCOSE-CAPILLARY: 117 mg/dL — AB (ref 65–99)

## 2016-05-29 LAB — SEDIMENTATION RATE

## 2016-05-29 LAB — MRSA PCR SCREENING: MRSA by PCR: NEGATIVE

## 2016-05-29 LAB — BRAIN NATRIURETIC PEPTIDE: B NATRIURETIC PEPTIDE 5: 56.4 pg/mL (ref 0.0–100.0)

## 2016-05-29 MED ORDER — HYDRALAZINE HCL 50 MG PO TABS
100.0000 mg | ORAL_TABLET | Freq: Three times a day (TID) | ORAL | Status: DC
  Administered 2016-05-29 – 2016-06-02 (×14): 100 mg via ORAL
  Filled 2016-05-29 (×14): qty 2

## 2016-05-29 MED ORDER — ONDANSETRON HCL 4 MG/2ML IJ SOLN
4.0000 mg | Freq: Four times a day (QID) | INTRAMUSCULAR | Status: DC | PRN
  Administered 2016-05-31: 4 mg via INTRAVENOUS
  Filled 2016-05-29: qty 2

## 2016-05-29 MED ORDER — FUROSEMIDE 10 MG/ML IJ SOLN
80.0000 mg | Freq: Two times a day (BID) | INTRAMUSCULAR | Status: DC
  Administered 2016-05-29 – 2016-05-30 (×2): 80 mg via INTRAVENOUS
  Filled 2016-05-29: qty 8

## 2016-05-29 MED ORDER — NITROGLYCERIN IN D5W 200-5 MCG/ML-% IV SOLN
0.0000 ug/min | INTRAVENOUS | Status: DC
  Administered 2016-05-29 – 2016-05-31 (×2): 15 ug/min via INTRAVENOUS
  Filled 2016-05-29 (×3): qty 250

## 2016-05-29 MED ORDER — ISOSORBIDE MONONITRATE ER 60 MG PO TB24: 60.0000 mg | ORAL_TABLET | Freq: Every day | ORAL | Status: DC

## 2016-05-29 MED ORDER — ATORVASTATIN CALCIUM 80 MG PO TABS
80.0000 mg | ORAL_TABLET | Freq: Every day | ORAL | Status: DC
  Administered 2016-05-29 – 2016-06-03 (×6): 80 mg via ORAL
  Filled 2016-05-29 (×6): qty 1

## 2016-05-29 MED ORDER — HYDRALAZINE HCL 20 MG/ML IJ SOLN
INTRAMUSCULAR | Status: AC
  Filled 2016-05-29: qty 1

## 2016-05-29 MED ORDER — POTASSIUM CHLORIDE CRYS ER 20 MEQ PO TBCR
40.0000 meq | EXTENDED_RELEASE_TABLET | Freq: Once | ORAL | Status: DC
  Filled 2016-05-29: qty 2

## 2016-05-29 MED ORDER — HYDRALAZINE HCL 20 MG/ML IJ SOLN
10.0000 mg | INTRAMUSCULAR | Status: DC | PRN
  Administered 2016-05-30 – 2016-06-01 (×6): 10 mg via INTRAVENOUS
  Filled 2016-05-29 (×6): qty 1

## 2016-05-29 MED ORDER — ISOSORBIDE MONONITRATE ER 60 MG PO TB24: 60.0000 mg | ORAL_TABLET | Freq: Every day | ORAL | 6 refills | Status: DC

## 2016-05-29 MED ORDER — SODIUM CHLORIDE 0.9 % IV SOLN
250.0000 mL | INTRAVENOUS | Status: DC | PRN
  Administered 2016-05-30: 250 mL via INTRAVENOUS

## 2016-05-29 MED ORDER — CARVEDILOL 3.125 MG PO TABS
9.3750 mg | ORAL_TABLET | Freq: Two times a day (BID) | ORAL | Status: DC
  Administered 2016-05-29: 9.375 mg via ORAL
  Filled 2016-05-29: qty 1

## 2016-05-29 MED ORDER — NITROGLYCERIN 0.4 MG SL SUBL: 0.4000 mg | SUBLINGUAL_TABLET | SUBLINGUAL | Status: DC | PRN

## 2016-05-29 MED ORDER — FUROSEMIDE 10 MG/ML IJ SOLN
80.0000 mg | Freq: Once | INTRAMUSCULAR | Status: AC
  Administered 2016-05-29: 80 mg via INTRAVENOUS
  Filled 2016-05-29: qty 8

## 2016-05-29 MED ORDER — DARBEPOETIN ALFA 100 MCG/0.5ML IJ SOSY
100.0000 ug | PREFILLED_SYRINGE | INTRAMUSCULAR | Status: DC
  Administered 2016-05-30: 100 ug via SUBCUTANEOUS
  Filled 2016-05-29: qty 0.5

## 2016-05-29 MED ORDER — INSULIN ASPART 100 UNIT/ML ~~LOC~~ SOLN
0.0000 [IU] | Freq: Three times a day (TID) | SUBCUTANEOUS | Status: DC
  Administered 2016-05-30: 2 [IU] via SUBCUTANEOUS
  Administered 2016-05-30: 1 [IU] via SUBCUTANEOUS
  Administered 2016-05-31 (×2): 2 [IU] via SUBCUTANEOUS
  Administered 2016-05-31 – 2016-06-01 (×2): 1 [IU] via SUBCUTANEOUS
  Administered 2016-06-01: 3 [IU] via SUBCUTANEOUS
  Administered 2016-06-02: 5 [IU] via SUBCUTANEOUS
  Administered 2016-06-03: 2 [IU] via SUBCUTANEOUS
  Administered 2016-06-03: 3 [IU] via SUBCUTANEOUS

## 2016-05-29 MED ORDER — INSULIN ASPART 100 UNIT/ML ~~LOC~~ SOLN: 0.0000 [IU] | Freq: Every day | SUBCUTANEOUS | Status: DC

## 2016-05-29 MED ORDER — SODIUM CHLORIDE 0.9% FLUSH: 3.0000 mL | INTRAVENOUS | Status: DC | PRN

## 2016-05-29 MED ORDER — ACETAMINOPHEN 325 MG PO TABS
650.0000 mg | ORAL_TABLET | ORAL | Status: DC | PRN
  Administered 2016-05-29 – 2016-06-02 (×4): 650 mg via ORAL
  Filled 2016-05-29 (×4): qty 2

## 2016-05-29 MED ORDER — SODIUM CHLORIDE 0.9% FLUSH
3.0000 mL | Freq: Two times a day (BID) | INTRAVENOUS | Status: DC
  Administered 2016-05-29 – 2016-05-30 (×2): 10 mL via INTRAVENOUS
  Administered 2016-05-31 – 2016-06-02 (×3): 3 mL via INTRAVENOUS

## 2016-05-29 MED ORDER — ASPIRIN EC 81 MG PO TBEC
81.0000 mg | DELAYED_RELEASE_TABLET | Freq: Every day | ORAL | Status: DC
  Administered 2016-05-29 – 2016-05-31 (×3): 81 mg via ORAL
  Filled 2016-05-29 (×3): qty 1

## 2016-05-29 MED ORDER — HYDRALAZINE HCL 50 MG PO TABS: 100.0000 mg | ORAL_TABLET | Freq: Three times a day (TID) | ORAL | Status: DC

## 2016-05-29 MED ORDER — HYDRALAZINE HCL 20 MG/ML IJ SOLN
10.0000 mg | INTRAMUSCULAR | Status: DC | PRN
  Administered 2016-05-29: 10 mg via INTRAVENOUS
  Filled 2016-05-29 (×2): qty 1

## 2016-05-29 MED ORDER — ENOXAPARIN SODIUM 30 MG/0.3ML ~~LOC~~ SOLN
30.0000 mg | SUBCUTANEOUS | Status: DC
  Administered 2016-05-29: 30 mg via SUBCUTANEOUS
  Filled 2016-05-29: qty 0.3

## 2016-05-29 NOTE — Progress Notes (Signed)
piv x 1 attempt started in patient's RAC per Oda Kilts PA-C request for IV lasix admin. 80 mg IV lasix x 1 dose admin through PIV over 2 minutes, flushed, salilne locked. Patient tolerated well, in CHF clinic pod #3 with wife, call bell, and urinal.  Will continue to monitor clsoely.  Total UOP:  PIV removed before patient DC'd from [place] today.  Renee Pain, RN

## 2016-05-29 NOTE — Progress Notes (Signed)
Came to check on on his arrival.  BP 201/131. Denies HA or vision changes.  Will give hydralazine IV 10 mg now, and give his evening dose of hydralazine now.   Repeat BP q 10 minutes for now.   Legrand Como [address] [place], PA-C 05/29/2016 4:30 PM

## 2016-05-29 NOTE — Progress Notes (Signed)
Patient ID: Jeremy [name], male   DOB: 04-07-1981, 35 y.o.   MRN: [id number]    Advanced Heart Failure Clinic Note   PCP: Dr. Randell Patient Primary cardiologist: Dr. Aundra Dubin  35 yo with history of nonischemic cardiomyopathy, HTN, and DM presents for cardiology followup.  In 4/16, patient was seen in the ER with edema and chest pain.  The only medication he was taking was metformin.  BP was elevated.  Echo showed EF 20-25%, and Cardiolite suggested ischemia.  He had RHC/LHC in 5/16.  This showed no coronary disease and mildly elevated filling pressures.    Last echo in 4/17 showed EF improved to 50%.  He presents today for add on for edema.  Recently ran out of metolazone. Took two doses last week back to back with minimal relief. Is up 23 lbs since we last saw in June (up 30 lbs since April).  Wakes up with eyes and testicles swollen.  Gets swollen in his hands and arms as well. Gets SOB showering or changing clothes occasionally. Hasn't restarted school yet. Takes all medications as directed.  He just took his medications on the way here. Does have chest heaviness when he gets SOB. Denies lightheadedness or dizziness. +Orthopnea and bendopnea.  Eats out, tries to watch salt. Drinking > 2 L.   Labs (6/16): LDL 308, HDL 46, TGs 284 Labs (7/16): K 3.5, creatinine 1.0 Labs 05/24/2015: K 3.9 Creatinine 1.43  Labs (3/17): creatinine 1.55, LDL 108, HDL 31 Labs (9/17): hemoglobin 8.3, K 4, BUN 40, creatinine 3.69, BNP 56  PMH: 1. Type II diabetes since 2006.  2. HTN since around 2010.   3. Asthma 4. Hyperlipidemia 5. Morbid obesity 6. Cardiomyopathy: Nonischemic.  Echo (5/16) with EF 20-25%, diffuse hypokinesis, grade II diastolic dysfunction.  Lexiscan Cardiolite (5/16) with EF 28%, inferior/inferolateral/apical ischemia.  RHC/LHC (5/16) with normal coronaries, mean RA 11, PA 33/21 mean 28, mean PCWP 18, CI 2.53.  - Echo (4/17): EF 50%, moderate LVH, normal RV size and systolic function. Grade IDD  7.  ABIs normal 8/16.  8. CKD  SH: Lives with girlfriend, nonsmoker, no cocaine, occasional marijuana, no ETOH.  Works as Art gallery manager.  FH: Father with CHF, died at 81.  Sister with CHF, PAD.   ROS: All systems reviewed and negative except as per HPI.    Current Outpatient Prescriptions  Medication Sig Dispense Refill  . ASPIR-LOW 81 MG EC tablet TAKE 1 TABLET BY MOUTH DAILY. 30 tablet 11  . atorvastatin (LIPITOR) 80 MG tablet Take 1 tablet (80 mg total) by mouth daily. 90 tablet 2  . carvedilol (COREG) 6.25 MG tablet TAKE 1 & 1/2 TABLETS BY MOUTH TWICE DAILY WITH MEALS 90 tablet 2  . glipiZIDE (GLUCOTROL) 5 MG tablet TAKE 1 TABLET BY MOUTH DAILY. 30 tablet PRN  . hydrALAZINE (APRESOLINE) 100 MG tablet Take 1 tablet (100 mg total) by mouth 3 (three) times daily. 90 tablet 3  . isosorbide mononitrate (IMDUR) 30 MG 24 hr tablet Take 1 tablet (30 mg total) by mouth daily. 30 tablet 3  . lisinopril (PRINIVIL,ZESTRIL) 20 MG tablet TAKE 1 TABLET (20 MG TOTAL) BY MOUTH DAILY. 30 tablet 3  . metFORMIN (GLUCOPHAGE) 1000 MG tablet TAKE 1 TABLET (1,000 MG TOTAL) BY MOUTH 2 (TWO) TIMES DAILY WITH A MEAL. 60 tablet 5  . metolazone (ZAROXOLYN) 2.5 MG tablet Take 1 tablet (2.5 mg total) by mouth as directed. 5 tablet 2  . nitroGLYCERIN (NITROSTAT) 0.4 MG SL tablet Place 1 tablet (0.4  mg total) under the tongue every 5 (five) minutes as needed for chest pain. 25 tablet 3  . potassium chloride SA (K-DUR,KLOR-CON) 20 MEQ tablet Take 1 tablet (20 mEq total) by mouth daily. 90 tablet 3  . spironolactone (ALDACTONE) 25 MG tablet Take 1 tablet (25 mg total) by mouth daily. 90 tablet 3  . torsemide (DEMADEX) 20 MG tablet Take 4 tablets (80 mg total) by mouth daily. May take additional 20 mg of torsemide prn weight gain 200 tablet 3   No current facility-administered medications for this encounter.    BP (!) 190/104 (BP Location: Right Arm, Patient Position: Sitting, Cuff Size: Large)   Pulse 100   Resp 20   Wt (!) 358  lb (162.4 kg)   SpO2 100%   BMI 42.45 kg/m    Wt Readings from Last 3 Encounters:  05/29/16 (!) 358 lb (162.4 kg)  04/11/16 (!) 341 lb 8 oz (154.9 kg)  02/27/16 (!) 335 lb (152 kg)     General: NAD Neck: Thick, JVP to jaw, no thyromegaly or thyroid nodule noted. Wife present Lungs: Diminished basilar sounds CV: Nondisplaced PMI.  Heart regular S1/S2, no S3/S4, no murmur. No carotid bruit.   Abdomen: Obese, Soft, NT, mildly distended, no HSM. No bruits or masses. +BS  Skin: Intact without lesions or rashes.  Neurologic: Alert and oriented x 3.  Psych: Normal affect. Extremities: No clubbing or cyanosis. 2+ edema up to knees. 1+ thigh edema HEENT: Normal.   Assessment/Plan:  1. Chronic systolic CHF: Nonischemic cardiomyopathy. ?from long-standing HTN. EF was 20-25% in 206 but ECHO 12/2015 had showed improvement to EF 50%, Grade 1 DD.  Significant weight gain since last appointment with volume overload on exam.  Says he has been taking all his meds.  This is complicated by elevated creatinine and new anemia.  BP elevated but just took his meds.  - Would like to place central access to monitor CVP/co-ox, especially with significant creatinine rise.  - Continue carvedilol 9.375 mg BID. Will not increase today with decompensation.  - Hold lisinopril and spironolactone for now. - Continue hydralazine 100 mg tid and Imdur 60 mg daily.   - May need to add amlodipine to control BP. - Needs repeat echo.  - Lasix 80 mg IV bid for now but need to follow creatinine closely.  - Today reinforced low salt diet and limiting fluid intake to < 2 liters per day. Also discussed daily weights.  2. Suspected OSA: Reschedule sleep study.   3.  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  LDL much better in 3/17, suspect related to compliance with atorvastatin.  - Continue atorvastatin 80 mg daily. 4. HTN: Elevated with volume overload and having just taken medications prior to arrival.   5. AKI on CKD III: ?Cause.   Not hypertensive.  Has CHF exacerbation with volume overload.   - Will consult renal - Will order Renal US and UA with microscopy, also will get ESR.  6. Anemia: New.  Needs hemoccult, Fe studies, folate, B12.    Casimiro Needle [address]" [place], PA-C 05/29/2016 11:21 AM   Patient seen with PA, agree with the above note.  I have made adjustments to the assessment/plan.   See the above plan. He needs admission and evaluation for volume overload, new anemia, and AKI.  I am going to arrange for central access.  Will hold spironolactone/lisinopril.  Send anemia studies and hemoccult.  Renal ultrasound, UA.  Lasix 80 mg IV bid for now.  Needs repeat echo.  Loralie Champagne 05/29/2016 12:39 PM

## 2016-05-29 NOTE — Progress Notes (Signed)
eLink Physician-Brief Progress Note Patient Name: [name] DOB: [birth year] MRN: [id number]   Date of Service  05/29/2016  HPI/Events of Note  Called to review CXR for L IJ CVL placement - L IJ CVL tip in distal SVC. No pneumothorax.   eICU Interventions  OK to use L IJ CVL.     Intervention Category Intermediate Interventions: Diagnostic test evaluation  Sommer,Steven Eugene 05/29/2016, 7:04 PM

## 2016-05-29 NOTE — Progress Notes (Signed)
Patient's Nitro drip at 49mcg for BP control. BP at 168/105 with an ordered goal of <150 SBP. Pt having 10/10 headache with no relief from Tylenol. Hydralazine prn given 1hr prior. MD paged: can give hydralazine q4 prn, goal now <160 SBP, titrate down Nitro drip. Will continue to monitor.

## 2016-05-29 NOTE — Procedures (Signed)
Central Venous Catheter Insertion Procedure Note Jeremy [name] [id number] [birth year]  Procedure: Insertion of Central Venous Catheter Indications: Assessment of intravascular volume, Drug and/or fluid administration and Frequent blood sampling  Procedure Details Consent: Risks of procedure as well as the alternatives and risks of each were explained to the (patient/caregiver).  Consent for procedure obtained. Time Out: Verified patient identification, verified procedure, site/side was marked, verified correct patient position, special equipment/implants available, medications/allergies/relevent history reviewed, required imaging and test results available.  Performed  Maximum sterile technique was used including antiseptics, cap, gloves, gown, hand hygiene, mask and sheet. Skin prep: Chlorhexidine; local anesthetic administered A antimicrobial bonded/coated triple lumen catheter was placed in the left internal jugular vein using the Seldinger technique. Ultrasound guidance used.Yes.   Catheter placed to 22  cm. Blood aspirated via all 3 ports and then flushed x 3. Line sutured x 2 and dressing applied.  Evaluation Blood flow good Complications: No apparent complications Patient did tolerate procedure well. Chest X-ray ordered to verify placement.  CXR: normal.  Georgann Housekeeper, AGACNP-BC [facility] Pager [phone] or [phone]  05/29/2016 8:31 PM  Rush Farmer, M.D. [facility] Pulmonary/Critical Care Medicine. Pager: [phone]. After hours pager: [phone].

## 2016-05-29 NOTE — Consult Note (Signed)
[facility] Renal Consultation Note  Requesting MD: Aundra Dubin Indication for Consultation: A on CRF in the setting of cardiomyopathy and volume overload  HPI:  Jeremy [name] is a 35 y.o. male with PMHx significant for DM, HTN, morbid obesity, nonischemic cardiomyopathy EF 20-25 in April 2016- subsequently had cardiac cath negative for CAD in May of 2016- repeat echo in April 2017 had improved to EF of 50%.  Since has been followed at [facility].  It appears had had issues with volume overload- seen in June- overloaded- torsemide increased- called in July with same complaint- metolazone added (only took twice)  but presents today up 23 pounds from June (total of up 30 pounds since April) with SOB, DOE, swollen testicles- BP very high. He claims compliance with meds. He denies any difficulty passing his urine- denies NSAIDS or any drugs/tobacco. Regarding kidney function- creatinine 1.0 in July of 2016 but had increased since.  Earlier this year in the 1.6-1.8 range but today returns at 3.69 and that is the reason for consult.  Also of note, hgb is 8.3.  He is being admitted to be treated with IV diuretics.  Renal ultrasound and U/A have been ordered.  He was on ACE, aldactone, metformin and potassium as OP- thankfully his potassium is OK  Creatinine, Ser  Date/Time Value Ref Range Status  05/29/2016 09:48 AM 3.69 (H) 0.61 - 1.24 mg/dL Final  02/27/2016 11:15 AM 1.67 (H) 0.61 - 1.24 mg/dL Final  01/03/2016 03:49 PM 1.79 (H) 0.61 - 1.24 mg/dL Final  11/21/2015 04:46 PM 1.55 (H) 0.76 - 1.27 mg/dL Final  11/15/2015 03:50 PM 1.71 (H) 0.61 - 1.24 mg/dL Final  05/24/2015 11:03 AM 1.43 (H) 0.61 - 1.24 mg/dL Final  05/01/2015 03:47 PM 1.24 0.61 - 1.24 mg/dL Final  03/09/2015 11:45 AM 1.00 0.40 - 1.50 mg/dL Final  03/02/2015 11:33 AM 1.05 0.40 - 1.50 mg/dL Final  02/22/2015 04:08 PM 1.04 0.40 - 1.50 mg/dL Final  01/31/2015 07:53 PM 1.23 0.61 - 1.24 mg/dL Final   01/26/2015 10:56 AM 1.03 0.40 - 1.50 mg/dL Final  12/31/2014 05:12 PM 1.08 0.50 - 1.35 mg/dL Final  11/12/2013 04:30 AM 0.82 0.50 - 1.35 mg/dL Final  11/11/2013 04:46 AM 0.71 0.50 - 1.35 mg/dL Final  11/10/2013 04:10 AM 0.67 0.50 - 1.35 mg/dL Final  11/09/2013 04:00 AM 0.72 0.50 - 1.35 mg/dL Final  11/08/2013 06:25 PM 0.73 0.50 - 1.35 mg/dL Final  11/08/2013 01:42 PM 0.85 0.50 - 1.35 mg/dL Final    Comment:    DELTA CHECK NOTED  11/08/2013 08:52 AM 1.40 (H) 0.50 - 1.35 mg/dL Final  11/08/2013 08:47 AM 1.23 0.50 - 1.35 mg/dL Final  02/02/2011 07:08 AM 0.75 0.4 - 1.5 mg/dL Final  02/01/2011 05:00 AM 0.75 0.4 - 1.5 mg/dL Final  01/31/2011 12:55 PM 0.78 0.4 - 1.5 mg/dL Final  01/18/2011 11:25 PM 0.68 0.4 - 1.5 mg/dL Final  12/21/2010 07:31 PM 1.2 0.4 - 1.5 mg/dL Final  03/23/2009 11:49 PM 0.90 0.4 - 1.5 mg/dL Final     PMHx:   Past Medical History:  Diagnosis Date  . Abscess of left groin   . Asthma   . Chest pain    a. 01/2015 Lexiscan MV: EF 28%, inferior, inferolateral, apical ischemia;  b. 01/2015 Cath: nl cors, PCWP 18 mmHg, CO 9.38 L/min, CI 3.53 L/min/m^2.  . Essential hypertension   . Hyperlipidemia   . Morbid obesity ([place])   . Nonischemic cardiomyopathy ([place])  a. 01/2015 Echo: EF 20-25%, diff HK, Gr 2 DD, Triv AI, mildly dil LA and Ao root.  . Type II diabetes mellitus ([place])    a. 01/2015 HbA1c = 8.9.    Past Surgical History:  Procedure Laterality Date  . CARDIAC CATHETERIZATION N/A 02/01/2015   Procedure: Right/Left Heart Cath and Coronary Angiography;  Surgeon: Josue Hector, MD;  Location: [facility];  Service: Cardiovascular;  Laterality: N/A;  . THORACOTOMY Left 11/08/2013   Procedure: LEFT THORACOTOMY;  Surgeon: Ivin Poot, MD;  Location: [facility] OR;  Service: Thoracic;  Laterality: Left;    Family Hx:  Family History  Problem Relation Age of Onset  . CAD Father     died @ 1.  [name] Heart attack Father   . Congestive Heart Failure Father   .  Diabetes Mellitus II Mother     died @ 38.  . Gastric cancer Mother   . Diabetes Mellitus II Sister   . CAD Sister     s/p PCI - age 42.    Social History:  reports that he has never smoked. He does not have any smokeless tobacco history on file. He reports that he uses drugs, including Marijuana. He reports that he does not drink alcohol.  Allergies: No Known Allergies  Medications: Prior to Admission medications   Medication Sig Start Date End Date Taking? Authorizing Provider  ASPIR-LOW 81 MG EC tablet TAKE 1 TABLET BY MOUTH DAILY. 09/14/15   Amy D Clegg, NP  atorvastatin (LIPITOR) 80 MG tablet Take 1 tablet (80 mg total) by mouth daily. 04/17/16   Shirley Friar, PA-C  carvedilol (COREG) 6.25 MG tablet TAKE 1 & 1/2 TABLETS BY MOUTH TWICE DAILY WITH MEALS 05/13/16   Jolaine Artist, MD  glipiZIDE (GLUCOTROL) 5 MG tablet TAKE 1 TABLET BY MOUTH DAILY. 10/11/15   Amy D Clegg, NP  hydrALAZINE (APRESOLINE) 100 MG tablet Take 1 tablet (100 mg total) by mouth 3 (three) times daily. 04/17/16   Shirley Friar, PA-C  isosorbide mononitrate (IMDUR) 60 MG 24 hr tablet Take 1 tablet (60 mg total) by mouth daily. 05/29/16   Shirley Friar, PA-C  lisinopril (PRINIVIL,ZESTRIL) 20 MG tablet TAKE 1 TABLET (20 MG TOTAL) BY MOUTH DAILY. 04/17/16   Shirley Friar, PA-C  metFORMIN (GLUCOPHAGE) 1000 MG tablet TAKE 1 TABLET (1,000 MG TOTAL) BY MOUTH 2 (TWO) TIMES DAILY WITH A MEAL. 12/11/15   [name], MD  metolazone (ZAROXOLYN) 2.5 MG tablet Take 1 tablet (2.5 mg total) by mouth as directed. 05/15/16   [name], MD  nitroGLYCERIN (NITROSTAT) 0.4 MG SL tablet Place 1 tablet (0.4 mg total) under the tongue every 5 (five) minutes as needed for chest pain. 08/14/15   Amy D Ninfa Meeker, NP  potassium chloride SA (K-DUR,KLOR-CON) 20 MEQ tablet Take 1 tablet (20 mEq total) by mouth daily. 08/14/15   Amy D Ninfa Meeker, NP  spironolactone (ALDACTONE) 25 MG tablet Take 1 tablet (25 mg total) by mouth  daily. 08/14/15   Amy D Ninfa Meeker, NP  torsemide (DEMADEX) 20 MG tablet Take 4 tablets (80 mg total) by mouth daily. May take additional 20 mg of torsemide prn weight gain 02/27/16   Amy D Ninfa Meeker, NP    I have reviewed the patient's current medications.  Labs:  Results for orders placed or performed during the hospital encounter of 05/29/16 (from the past 48 hour(s))  Basic metabolic panel     Status: Abnormal  Collection Time: 05/29/16  9:48 AM  Result Value Ref Range   Sodium 143 135 - 145 mmol/L   Potassium 4.0 3.5 - 5.1 mmol/L   Chloride 112 (H) 101 - 111 mmol/L   CO2 22 22 - 32 mmol/L   Glucose, Bld 106 (H) 65 - 99 mg/dL   BUN 40 (H) 6 - 20 mg/dL   Creatinine, Ser 3.69 (H) 0.61 - 1.24 mg/dL   Calcium 8.0 (L) 8.9 - 10.3 mg/dL   GFR calc non Af Amer 20 (L) >60 mL/min   GFR calc Af Amer 23 (L) >60 mL/min    Comment: (NOTE) The eGFR has been calculated using the CKD EPI equation. This calculation has not been validated in all clinical situations. eGFR's persistently <60 mL/min signify possible Chronic Kidney Disease.    Anion gap 9 5 - 15  B Nat Peptide     Status: None   Collection Time: 05/29/16  9:48 AM  Result Value Ref Range   B Natriuretic Peptide 56.4 0.0 - 100.0 pg/mL  CBC     Status: Abnormal   Collection Time: 05/29/16  9:48 AM  Result Value Ref Range   WBC 9.4 4.0 - 10.5 K/uL   RBC 3.31 (L) 4.22 - 5.81 MIL/uL   Hemoglobin 8.3 (L) 13.0 - 17.0 g/dL   HCT 26.9 (L) 39.0 - 52.0 %   MCV 81.3 78.0 - 100.0 fL   MCH 25.1 (L) 26.0 - 34.0 pg   MCHC 30.9 30.0 - 36.0 g/dL   RDW 15.2 11.5 - 15.5 %   Platelets 374 150 - 400 K/uL     ROS:   Pertinent items noted in HPI and remainder of comprehensive ROS otherwise negative.  Physical Exam: Vitals:   05/29/16 1636  BP: (!) 201/131     General: pleasant , obese BM. NAD HEENT: PERRLA, EOMI- mm moist Neck: positive for JVD Heart: tachy Lungs: dec BS at the bases Abdomen: obese, soft non tender Extremities: pitting edema  to abdominal wall  Skin: warm and dry  Neuro: alert, non focal   Assessment/Plan: 35 year old black male with DM, HTN and cardiomyopathy who presents with A on CRF in the setting of volume overload and malignant HTN  1.Renal- A on CRF (crt 1.67 in June 2017- now 3.69) with volume overload refractory to diuretics as OP and malignant HTN- kidney function change likely hemodynamically mediated due to malignant HTN but will check U/A and renal ultrasound to rule out other issues.  Holding ACE for now.  Pt refuses foley- if ultrasound neg for obstruction is OK but if hydro- needs foley 2. Hypertension/volume  - massively volume overloaded-  giving IV diuretics- ordered 80 IV BID- will look later to see how responds but suspect he will need a larger dose.  Also on coreg and hydralazine per the heart failure team and using a nitro drip.  Need to be careful about dropping BP too low and giving ATN  3. Anemia  - - last hgb a year ago normal.  Will need to rule out occult blood loss- iron stores pending and start ESA.  Transfuse PRN per cards  4. [place]- potassium is OK- in light of AKI holding aldactone and potassium supplement- with diuresis K is bound to drop- labs in AM 5. DM- hold metformin given AKI- per primary team   Thank you for this consult, we will continue to follow    Virda Betters A 05/29/2016, 4:32 PM

## 2016-05-29 NOTE — H&P (Signed)
Advanced Heart Failure Team History and Physical Note   Primary Physician:  Charlott Rakes, MD Primary Cardiologist:  Dr. Aundra Dubin   Reason for Admission: Volume overload and ARF on CKD Stage III   HPI:    35 yo with history of nonischemic cardiomyopathy, HTN, and DM presents for cardiology followup.  In 4/16, patient was seen in the ER with edema and chest pain.  The only medication he was taking was metformin.  BP was elevated.  Echo showed EF 20-25%, and Cardiolite suggested ischemia.  He had RHC/LHC in 5/16.  This showed no coronary disease and mildly elevated filling pressures.    Last echo in 4/17 showed EF improved to 50%.  He presented today for add on for edema.  Recently ran out of metolazone. Took two doses last week back to back with minimal relief. Is up 23 lbs since we last saw in June (up 30 lbs since April).  Wakes up with eyes and testicles swollen.  Gets swollen in his hands and arms as well. Gets SOB showering or changing clothes occasionally. Hasn't restarted school yet. Takes all medications as directed., though he just took his medications on the way here. Does have chest heaviness when he gets SOB. States he feels like urine output has gradually decreased over the past few weeks. Denies lightheadedness or dizziness. +Orthopnea and bendopnea.  Eats out, tries to watch salt. Drinking > 2 L.    Review of Systems: [y] = yes, '[ ]'$  = no   General: Weight gain [y]; Weight loss '[ ]'$ ; Anorexia '[ ]'$ ; Fatigue [y]; Fever '[ ]'$ ; Chills '[ ]'$ ; Weakness '[ ]'$   Cardiac: Chest pain/pressure [y]; Resting SOB '[ ]'$ ; Exertional SOB [y]; Orthopnea [y]; Pedal Edema [y]; Palpitations '[ ]'$ ; Syncope '[ ]'$ ; Presyncope '[ ]'$ ; Paroxysmal nocturnal dyspnea'[ ]'$   Pulmonary: Cough '[ ]'$ ; Wheezing'[ ]'$ ; Hemoptysis'[ ]'$ ; Sputum '[ ]'$ ; Snoring '[ ]'$   GI: Vomiting'[ ]'$ ; Dysphagia'[ ]'$ ; Melena'[ ]'$ ; Hematochezia '[ ]'$ ; Heartburn'[ ]'$ ; Abdominal pain '[ ]'$ ; Constipation '[ ]'$ ; Diarrhea '[ ]'$ ; BRBPR '[ ]'$   GU: Hematuria'[ ]'$ ; Dysuria '[ ]'$ ; Nocturia'[ ]'$     Vascular: Pain in legs with walking '[ ]'$ ; Pain in feet with lying flat '[ ]'$ ; Non-healing sores '[ ]'$ ; Stroke '[ ]'$ ; TIA '[ ]'$ ; Slurred speech '[ ]'$ ;  Neuro: Headaches'[ ]'$ ; Vertigo'[ ]'$ ; Seizures'[ ]'$ ; Paresthesias'[ ]'$ ;Blurred vision '[ ]'$ ; Diplopia '[ ]'$ ; Vision changes '[ ]'$   Ortho/Skin: Arthritis '[ ]'$ ; Joint pain [y]; Muscle pain '[ ]'$ ; Joint swelling '[ ]'$ ; Back Pain '[ ]'$ ; Rash '[ ]'$   Psych: Depression'[ ]'$ ; Anxiety'[ ]'$   Heme: Bleeding problems '[ ]'$ ; Clotting disorders '[ ]'$ ; Anemia '[ ]'$   Endocrine: Diabetes [y]; Thyroid dysfunction'[ ]'$    Home Medications Prior to Admission medications   Medication Sig Start Date End Date Taking? Authorizing Provider  ASPIR-LOW 81 MG EC tablet TAKE 1 TABLET BY MOUTH DAILY. 09/14/15   Amy D Clegg, NP  atorvastatin (LIPITOR) 80 MG tablet Take 1 tablet (80 mg total) by mouth daily. 04/17/16   Shirley Friar, PA-C  carvedilol (COREG) 6.25 MG tablet TAKE 1 & 1/2 TABLETS BY MOUTH TWICE DAILY WITH MEALS 05/13/16   Jolaine Artist, MD  glipiZIDE (GLUCOTROL) 5 MG tablet TAKE 1 TABLET BY MOUTH DAILY. 10/11/15   Amy D Clegg, NP  hydrALAZINE (APRESOLINE) 100 MG tablet Take 1 tablet (100 mg total) by mouth 3 (three) times daily. 04/17/16   Shirley Friar, PA-C  isosorbide mononitrate (IMDUR) 60 MG 24 hr  tablet Take 1 tablet (60 mg total) by mouth daily. 05/29/16   Shirley Friar, PA-C  lisinopril (PRINIVIL,ZESTRIL) 20 MG tablet TAKE 1 TABLET (20 MG TOTAL) BY MOUTH DAILY. 04/17/16   Shirley Friar, PA-C  metFORMIN (GLUCOPHAGE) 1000 MG tablet TAKE 1 TABLET (1,000 MG TOTAL) BY MOUTH 2 (TWO) TIMES DAILY WITH A MEAL. 12/11/15   [name], MD  metolazone (ZAROXOLYN) 2.5 MG tablet Take 1 tablet (2.5 mg total) by mouth as directed. 05/15/16   [name], MD  nitroGLYCERIN (NITROSTAT) 0.4 MG SL tablet Place 1 tablet (0.4 mg total) under the tongue every 5 (five) minutes as needed for chest pain. 08/14/15   Amy D Ninfa Meeker, NP  potassium chloride SA (K-DUR,KLOR-CON) 20 MEQ tablet Take 1  tablet (20 mEq total) by mouth daily. 08/14/15   Amy D Ninfa Meeker, NP  spironolactone (ALDACTONE) 25 MG tablet Take 1 tablet (25 mg total) by mouth daily. 08/14/15   Amy D Ninfa Meeker, NP  torsemide (DEMADEX) 20 MG tablet Take 4 tablets (80 mg total) by mouth daily. May take additional 20 mg of torsemide prn weight gain 02/27/16   Conrad , NP    Past Medical History: Past Medical History:  Diagnosis Date  . Abscess of left groin   . Asthma   . Chest pain    a. 01/2015 Lexiscan MV: EF 28%, inferior, inferolateral, apical ischemia;  b. 01/2015 Cath: nl cors, PCWP 18 mmHg, CO 9.38 L/min, CI 3.53 L/min/m^2.  . Essential hypertension   . Hyperlipidemia   . Morbid obesity ([place])   . Nonischemic cardiomyopathy ([place])    a. 01/2015 Echo: EF 20-25%, diff HK, Gr 2 DD, Triv AI, mildly dil LA and Ao root.  . Type II diabetes mellitus ([place])    a. 01/2015 HbA1c = 8.9.    Past Surgical History: Past Surgical History:  Procedure Laterality Date  . CARDIAC CATHETERIZATION N/A 02/01/2015   Procedure: Right/Left Heart Cath and Coronary Angiography;  Surgeon: Josue Hector, MD;  Location: [facility];  Service: Cardiovascular;  Laterality: N/A;  . THORACOTOMY Left 11/08/2013   Procedure: LEFT THORACOTOMY;  Surgeon: Ivin Poot, MD;  [facility];  Service: Thoracic;  Laterality: Left;    Family History: Family History  Problem Relation Age of Onset  . CAD Father     died @ 75.  [name] Heart attack Father   . Congestive Heart Failure Father   . Diabetes Mellitus II Mother     died @ 50.  . Gastric cancer Mother   . Diabetes Mellitus II Sister   . CAD Sister     s/p PCI - age 10.    Social History: Social History   Social History  . Marital status: Single    Spouse name: N/A  . Number of children: N/A  . Years of education: N/A   Social History Main Topics  . Smoking status: Never Smoker  . Smokeless tobacco: Not on file  . Alcohol use No  . Drug use:     Types: Marijuana     Comment:  smoked marijuana 2-3 x/wk.  [name] Sexual activity: Yes    Partners: Female   Other Topics Concern  . Not on file   Social History Narrative   Lives in [place] by himself.  Currently in [facility].        Allergies:  No Known Allergies  Objective:    Vital Signs:   BP (!) 190/104 ->  170/90 Pulse 100 Resp 20  SpO2 100%   BMI 42.45 kg/m       Wt Readings from Last 3 Encounters:  05/29/16 (!) 358 lb (162.4 kg)  04/11/16 (!) 341 lb 8 oz (154.9 kg)  02/27/16 (!) 335 lb (152 kg)    Physical Exam: Neck: Thick, JVP to jaw, no thyromegaly or thyroid nodule noted. Wife present Lungs: Diminished basilar sounds CV: Nondisplaced PMI.  Heart regular S1/S2, no S3/S4, no murmur. No carotid bruit.   Abdomen: Obese, Soft, NT, mildly distended, no HSM. No bruits or masses. +BS  Skin: Intact without lesions or rashes.  Neurologic: Alert and oriented x 3.  Psych: Normal affect. Extremities: No clubbing or cyanosis. 2+ edema up to knees. 1+ thigh edema HEENT: Normal.   Labs: Basic Metabolic Panel:  Recent Labs Lab 05/29/16 0948  NA 143  K 4.0  CL 112*  CO2 22  GLUCOSE 106*  BUN 40*  CREATININE 3.69*  CALCIUM 8.0*    Liver Function Tests: No results for input(s): AST, ALT, ALKPHOS, BILITOT, PROT, ALBUMIN in the last 168 hours. No results for input(s): LIPASE, AMYLASE in the last 168 hours. No results for input(s): AMMONIA in the last 168 hours.  CBC:  Recent Labs Lab 05/29/16 0948  WBC 9.4  HGB 8.3*  HCT 26.9*  MCV 81.3  PLT 374    Cardiac Enzymes: No results for input(s): CKTOTAL, CKMB, CKMBINDEX, TROPONINI in the last 168 hours.  BNP: BNP (last 3 results)  Recent Labs  01/03/16 1550 02/27/16 1115 05/29/16 0948  BNP 20.2 17.3 56.4    ProBNP (last 3 results) No results for input(s): PROBNP in the last 8760 hours.   CBG: No results for input(s): GLUCAP in the last 168 hours.  Coagulation Studies: No results for input(s): LABPROT, INR in the last 72  hours.  Other results:   Imaging:  No results found.   Assessment/Plan   35 yo with history of nonischemic cardiomyopathy EF 50% with Grade 1 DD, HTN, suspected OSA, HLD, HTN, CKD stage III who presented to clinic with volume overload. Found to be in ARF on CKD stage III with anemia as well. Being admitted for further evaluation.   1. Chronic systolic CHF: Nonischemic cardiomyopathy. ?from long-standing HTN. EF was 20-25% in 2016 but ECHO 12/2015 had showed improvement to EF 50%, Grade 1 DD.  Significant weight gain since last appointment with volume overload on exam.  Says he has been taking all his meds.  This is complicated by elevated creatinine and new anemia.  BP elevated but just took his meds.  - Would like to place central access to monitor CVP/co-ox, especially with significant creatinine rise.  - Continue carvedilol 9.375 mg BID. Will not increase today with decompensation.  - Hold lisinopril and spironolactone for now. - Continue hydralazine 100 mg tid and Imdur 60 mg daily.   - May need to add amlodipine to control BP. - Needs repeat echo.  - Lasix 80 mg IV bid for now but need to follow creatinine closely.  - Today reinforced low salt diet and limiting fluid intake to < 2 liters per day. Also discussed daily weights.  2. Suspected OSA: Reschedule sleep study.   3.  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  LDL much better in 3/17, suspect related to compliance with atorvastatin.  - Continue atorvastatin 80 mg daily. 4. HTN: Elevated with volume overload and having just taken medications prior to arrival.   5. AKI on CKD III: ?Cause.  Not hypertensive.  Has CHF exacerbation with volume overload.   - Will consult renal - Will order Renal US and UA with microscopy, also will get ESR.  6. Anemia: New.  Needs hemoccult, Fe studies, folate, B12.    Legrand Como [address]" Crane, PA-C 05/29/2016 11:21 AM   Advanced Heart Failure Team Pager [phone] (M-F; 7a - 4p)  Please contact [facility] for night-coverage after hours (4p -7a ) and weekends on [email or web address]   Patient seen with PA, agree with the above note.  I have made adjustments to the assessment/plan.   See the above plan. He needs admission and evaluation for volume overload, new anemia, and AKI.  I am going to arrange for central access.  Will hold spironolactone/lisinopril.  Send anemia studies and hemoccult.  Renal ultrasound, UA.  Lasix 80 mg IV bid for now.  Needs repeat echo.   Loralie Champagne, MD 05/29/2016 12:39 PM

## 2016-05-29 NOTE — Telephone Encounter (Signed)
Patient called to make aware of bed placement in 2H16 per Shawn at patient placement. Patient made aware and reports will check in at main entrance of [facility] Shortly.  Renee Pain, RN

## 2016-05-29 NOTE — Patient Instructions (Signed)
INCREASE Imdur to 60mg  once daily. Can "double up" on current tabs, nex Rx will be 60 mg tabs.  Admissions will contact you tonight/tomorrow morning for your room # and insutrctions to be directly admitted from home.  Do the following things EVERYDAY: 1) Weigh yourself in the morning before breakfast. Write it down and keep it in a log. 2) Take your medicines as prescribed 3) Eat low salt foods-Limit salt (sodium) to 2000 mg per day.  4) Stay as active as you can everyday 5) Limit all fluids for the day to less than 2 liters

## 2016-05-30 ENCOUNTER — Inpatient Hospital Stay (HOSPITAL_COMMUNITY): Payer: Self-pay

## 2016-05-30 DIAGNOSIS — D509 Iron deficiency anemia, unspecified: Secondary | ICD-10-CM

## 2016-05-30 DIAGNOSIS — I509 Heart failure, unspecified: Secondary | ICD-10-CM

## 2016-05-30 DIAGNOSIS — N170 Acute kidney failure with tubular necrosis: Secondary | ICD-10-CM

## 2016-05-30 DIAGNOSIS — N049 Nephrotic syndrome with unspecified morphologic changes: Secondary | ICD-10-CM

## 2016-05-30 LAB — RENAL FUNCTION PANEL
ALBUMIN: 1.1 g/dL — AB (ref 3.5–5.0)
Anion gap: 7 (ref 5–15)
BUN: 36 mg/dL — AB (ref 6–20)
CALCIUM: 7.6 mg/dL — AB (ref 8.9–10.3)
CO2: 26 mmol/L (ref 22–32)
CREATININE: 3.68 mg/dL — AB (ref 0.61–1.24)
Chloride: 109 mmol/L (ref 101–111)
GFR calc Af Amer: 23 mL/min — ABNORMAL LOW (ref >60)
GFR, EST NON AFRICAN AMERICAN: 20 mL/min — AB (ref >60)
GLUCOSE: 117 mg/dL — AB (ref 65–99)
PHOSPHORUS: 4.9 mg/dL — AB (ref 2.5–4.6)
Potassium: 3.6 mmol/L (ref 3.5–5.1)
SODIUM: 142 mmol/L (ref 135–145)

## 2016-05-30 LAB — CBC WITH DIFFERENTIAL/PLATELET
BASOS ABS: 0 10*3/uL (ref 0.0–0.1)
Basophils Relative: 0 %
EOS ABS: 0.1 10*3/uL (ref 0.0–0.7)
EOS PCT: 2 %
HCT: 24.6 % — ABNORMAL LOW (ref 39.0–52.0)
Hemoglobin: 7.5 g/dL — ABNORMAL LOW (ref 13.0–17.0)
Lymphocytes Relative: 23 %
Lymphs Abs: 1.8 10*3/uL (ref 0.7–4.0)
MCH: 24.7 pg — ABNORMAL LOW (ref 26.0–34.0)
MCHC: 30.5 g/dL (ref 30.0–36.0)
MCV: 80.9 fL (ref 78.0–100.0)
MONO ABS: 0.8 10*3/uL (ref 0.1–1.0)
Monocytes Relative: 10 %
Neutro Abs: 5.2 10*3/uL (ref 1.7–7.7)
Neutrophils Relative %: 65 %
PLATELETS: 327 10*3/uL (ref 150–400)
RBC: 3.04 MIL/uL — AB (ref 4.22–5.81)
RDW: 15.3 % (ref 11.5–15.5)
WBC: 7.9 10*3/uL (ref 4.0–10.5)

## 2016-05-30 LAB — PROTEIN / CREATININE RATIO, URINE
CREATININE, URINE: 48.84 mg/dL
Protein Creatinine Ratio: 12.96 mg/mg{Cre} — ABNORMAL HIGH (ref 0.00–0.15)
TOTAL PROTEIN, URINE: 633 mg/dL

## 2016-05-30 LAB — ECHOCARDIOGRAM COMPLETE
HEIGHTINCHES: 77 in
Weight: 5553.6 oz

## 2016-05-30 LAB — CREATININE, URINE, 24 HOUR: Creatinine, Urine: 47.91 mg/dL

## 2016-05-30 LAB — GLUCOSE, CAPILLARY
GLUCOSE-CAPILLARY: 148 mg/dL — AB (ref 65–99)
GLUCOSE-CAPILLARY: 170 mg/dL — AB (ref 65–99)
Glucose-Capillary: 132 mg/dL — ABNORMAL HIGH (ref 65–99)
Glucose-Capillary: 166 mg/dL — ABNORMAL HIGH (ref 65–99)

## 2016-05-30 LAB — CARBOXYHEMOGLOBIN
CARBOXYHEMOGLOBIN: 1.3 % (ref 0.5–1.5)
METHEMOGLOBIN: 1.2 % (ref 0.0–1.5)
O2 Saturation: 70 %
TOTAL HEMOGLOBIN: 7.5 g/dL — AB (ref 12.0–16.0)

## 2016-05-30 MED ORDER — CARVEDILOL 12.5 MG PO TABS
12.5000 mg | ORAL_TABLET | Freq: Two times a day (BID) | ORAL | Status: DC
  Administered 2016-05-30 – 2016-05-31 (×3): 12.5 mg via ORAL
  Filled 2016-05-30 (×3): qty 1

## 2016-05-30 MED ORDER — AMLODIPINE BESYLATE 5 MG PO TABS
5.0000 mg | ORAL_TABLET | Freq: Every day | ORAL | Status: DC
  Administered 2016-05-30 – 2016-05-31 (×2): 5 mg via ORAL
  Filled 2016-05-30 (×2): qty 1

## 2016-05-30 MED ORDER — FERUMOXYTOL INJECTION 510 MG/17 ML
510.0000 mg | INTRAVENOUS | Status: DC
Start: 2016-05-30 — End: 2016-06-03
  Administered 2016-05-30: 510 mg via INTRAVENOUS
  Filled 2016-05-30 (×2): qty 17

## 2016-05-30 MED ORDER — ENOXAPARIN SODIUM 40 MG/0.4ML ~~LOC~~ SOLN
40.0000 mg | SUBCUTANEOUS | Status: AC
  Administered 2016-05-30 – 2016-05-31 (×2): 40 mg via SUBCUTANEOUS
  Filled 2016-05-30 (×2): qty 0.4

## 2016-05-30 MED ORDER — FUROSEMIDE 10 MG/ML IJ SOLN
INTRAMUSCULAR | Status: AC
  Filled 2016-05-30: qty 8

## 2016-05-30 MED ORDER — POTASSIUM CHLORIDE CRYS ER 20 MEQ PO TBCR
40.0000 meq | EXTENDED_RELEASE_TABLET | Freq: Once | ORAL | Status: AC
  Administered 2016-05-30: 40 meq via ORAL
  Filled 2016-05-30: qty 2

## 2016-05-30 MED ORDER — OXYCODONE-ACETAMINOPHEN 5-325 MG PO TABS
1.0000 | ORAL_TABLET | Freq: Four times a day (QID) | ORAL | Status: DC | PRN
  Administered 2016-05-30 – 2016-06-02 (×10): 1 via ORAL
  Filled 2016-05-30 (×10): qty 1

## 2016-05-30 MED ORDER — POTASSIUM CHLORIDE CRYS ER 20 MEQ PO TBCR: 20.0000 meq | EXTENDED_RELEASE_TABLET | Freq: Two times a day (BID) | ORAL | Status: DC

## 2016-05-30 NOTE — Progress Notes (Signed)
*  PRELIMINARY RESULTS* Echocardiogram 2D Echocardiogram has been performed.  Leavy Cella 05/30/2016, 10:41 AM

## 2016-05-30 NOTE — Progress Notes (Signed)
Advanced Heart Failure Rounding Note   Subjective:    Admitted with marked volume overload, AKI, HTN crisis, and anemia. Started on IV lasix and nitro drip. Negative 1.7 liters.   Denies SOB.   Objective:   Weight Range:  Vital Signs:   Temp:  [98 F (36.7 C)-99.1 F (37.3 C)] 98.3 F (36.8 C) (09/22 0400) Pulse Rate:  [87-106] 100 (09/22 0703) Resp:  [0-29] 26 (09/22 0703) BP: (135-203)/(68-133) 174/116 (09/22 0703) SpO2:  [94 %-100 %] 100 % (09/22 0703) FiO2 (%):  [21 %] 21 % (09/21 1605) Weight:  [347 lb 1.6 oz (157.4 kg)-350 lb 11.2 oz (159.1 kg)] 347 lb 1.6 oz (157.4 kg) (09/22 0500)    Weight change: Filed Weights   05/29/16 1604 05/30/16 0500  Weight: (!) 350 lb 11.2 oz (159.1 kg) (!) 347 lb 1.6 oz (157.4 kg)    Intake/Output:   Intake/Output Summary (Last 24 hours) at 05/30/16 0729 Last data filed at 05/30/16 0600  Gross per 24 hour  Intake          1061.51 ml  Output             2800 ml  Net         -1738.49 ml     Physical Exam: General:  Well appearing. No resp difficulty. In bed HEENT: normal Neck: supple. JVP does not appear elevated.  Carotids 2+ bilat; no bruits. No lymphadenopathy or thryomegaly appreciated. LIJ Cor: PMI nondisplaced. Regular rate & rhythm. No rubs, gallops or murmurs. Lungs: clear Abdomen: soft, nontender, nondistended. No hepatosplenomegaly. No bruits or masses. Good bowel sounds. Extremities: no cyanosis, clubbing, rash, R and LLE trace edema Neuro: alert & orientedx3, cranial nerves grossly intact. moves all 4 extremities w/o difficulty. Affect pleasant  Telemetry: NSR 90s  Labs: Basic Metabolic Panel:  Recent Labs Lab 05/29/16 0948 05/30/16 0546  NA 143 142  K 4.0 3.6  CL 112* 109  CO2 22 26  GLUCOSE 106* 117*  BUN 40* 36*  CREATININE 3.69* 3.68*  CALCIUM 8.0* 7.6*  PHOS  --  4.9*    Liver Function Tests:  Recent Labs Lab 05/30/16 0546  ALBUMIN 1.1*   No results for input(s): LIPASE, AMYLASE in  the last 168 hours. No results for input(s): AMMONIA in the last 168 hours.  CBC:  Recent Labs Lab 05/29/16 0948 05/30/16 0545  WBC 9.4 7.9  NEUTROABS  --  5.2  HGB 8.3* 7.5*  HCT 26.9* 24.6*  MCV 81.3 80.9  PLT 374 327    Cardiac Enzymes: No results for input(s): CKTOTAL, CKMB, CKMBINDEX, TROPONINI in the last 168 hours.  BNP: BNP (last 3 results)  Recent Labs  01/03/16 1550 02/27/16 1115 05/29/16 0948  BNP 20.2 17.3 56.4    ProBNP (last 3 results) No results for input(s): PROBNP in the last 8760 hours.    Other results:  Imaging: US Renal  Result Date: 05/29/2016 CLINICAL DATA:  Acute renal insufficiency. EXAM: RENAL / URINARY TRACT ULTRASOUND COMPLETE COMPARISON:  01/11/2015 FINDINGS: Right Kidney: Length: 15.9 cm. Mildly increased and heterogeneous parenchymal echotexture. No mass or hydronephrosis visualized. Left Kidney: Length: 16.8 cm. Mildly increased and heterogeneous parenchymal echotexture. 2.9 cm simple appearing cyst seen in midpole. No mass or hydronephrosis visualized. Bladder: Appears normal for degree of bladder distention. IMPRESSION: Bilateral renal enlargement with mildly increased and heterogeneous parenchymal echotexture, consistent with acute medical renal disease. No evidence of hydronephrosis. Electronically Signed   By: Sharrie Rothman.D.  On: 05/29/2016 19:35   Dg Chest Portable 1 View  Result Date: 05/29/2016 CLINICAL DATA:  Central line placement. EXAM: PORTABLE CHEST 1 VIEW COMPARISON:  11/23/2013 FINDINGS: Left IJ center venous catheter tip is in the mid distal SVC just below the carina. Low lung volumes with vascular crowding and atelectasis. Accentuation of heart size due to low lung volumes and the AP projection and portable technique. No edema or effusions. IMPRESSION: Left IJ center venous catheter in good position without complicating features. Low lung volumes with vascular crowding and streaky atelectasis. Electronically Signed   By:  Marijo Sanes M.D.   On: 05/29/2016 17:45      Medications:     Scheduled Medications: . aspirin EC  81 mg Oral Daily  . atorvastatin  80 mg Oral Daily  . carvedilol  9.375 mg Oral BID WC  . darbepoetin (ARANESP) injection - NON-DIALYSIS  100 mcg Subcutaneous Q Fri-1800  . enoxaparin (LOVENOX) injection  30 mg Subcutaneous Q24H  . furosemide  80 mg Intravenous BID  . hydrALAZINE  100 mg Oral TID  . insulin aspart  0-5 Units Subcutaneous QHS  . insulin aspart  0-9 Units Subcutaneous TID WC  . sodium chloride flush  3 mL Intravenous Q12H     Infusions: . nitroGLYCERIN 30 mcg/min (05/30/16 0648)     PRN Medications:  sodium chloride, acetaminophen, hydrALAZINE, nitroGLYCERIN, ondansetron (ZOFRAN) IV, sodium chloride flush   Assessment/Plan/Discussion   35 yo with history of nonischemic cardiomyopathy EF 50% with Grade 1 DD, HTN, suspected OSA, HLD, HTN, CKD stage III who presented to clinic with volume overload. Found to be in ARF on CKD stage III with anemia as well. Being admitted for further evaluation.   1. Acute/Chronic systolic CHF: Nonischemic cardiomyopathy. ?from long-standing HTN. EF was 20-25% in 2016 but ECHO 12/2015 had showed improvement toEF 50%, Grade 1 DD. Significant weight gain since last appointment with volume overload on exam. This is complicated by elevated creatinine and new anemia.  Todays CO-OX is 70% . Increase carvedilol 12.5 twice a day.    - Off ace/spiro. Continue hydralazine 100 mg tid - Add 5 mg amlodipine daily. - CVP 4. Give another dose IV lasix then stop.   - Add ted hose.  - Today reinforced low salt diet and limiting fluid intake to <2 liters per day. Also discussed daily weights.  2. Suspected OSA: Reschedule sleep study.  3. 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   Continue atorvastatin 80 mg daily. 4. HTN Crisis:  On Nitro drip 30 mcg but remains hypertensive. Increase nitro drip. Increase carvedilol and add amlodipine as above.  . .  5. AKI on CKD III: ?Cause.  - Renal recommendations appreciated. Renal US- negative for hydronephrosis. Creatinine 3.6>3.6     6. Anemia: New. Needs hemoccult, Fe studies, folate, B12. Iron stores low. Give Fereheme.  Todays hgb continues to trend down. 8.3>7.5. Will need GI work up.   Length of Stay: 1   Amy Clegg NP-C  05/30/2016, 7:29 AM  Advanced Heart Failure Team Pager [phone] (M-F; 7a - 4p)  Please contact [facility] for night-coverage after hours (4p -7a ) and weekends on [email or web address]  Patient seen with NP, agree with the above note.  He was admitted with extensive peripheral edema, AKI, and anemia as well as hypertensive urgency.    Today, co-ox good at 70%.  CVP is only 4.  He is noted to have proteinuria, low albumin, and ESR > 140.   1. AKI on CKD  stage III: With very low albumin, peripheral edema with low to normal CVP, and proteinuria, I suspect that he has a form of nephrotic syndrome.  Development of nephrotic syndrome with ongoing use of diuretics and ACEI may have triggered AKI. He has a very high ESR.  - Collect 24 hour urine protein and creatinine.  - Will send ANA, SPEP, complement levels, and hepatitis serologies.  - He is being followed by nephrology.  - Hold Lasix with low CVP.  - ACEI and spironolactone stopped.  2. Hypertensive urgency: Headache with NTG gtt, BP still high.  - Increase Coreg to 12.5 mg bid. - Continue hydralazine 100 mg tid - Add amlodipine 5 mg daily.  - Titrate down NTG gtt as able.  3. Chronic systolic CHF: EF as low as 25% in past, most recently was up to 50% (nonischemic CMP, thought to be related to HTN in the past).  Good co-ox and CVP 4. - Hold Lasix today.  - Continue Coreg, hydralazine/nitrates. - Needs repeat echo today.  4. Anemia: New.  Fe-deficiency anemia.  Needs IV Fe.  Hemoccult stool.  Will need GI workup.   Loralie Champagne 05/30/2016 8:20 AM

## 2016-05-30 NOTE — Consult Note (Signed)
Referring Provider:  Dr. Aundra Dubin Primary Care Physician:  Charlott Rakes, MD Primary Gastroenterologist:  None   Reason for Consultation:  Anemia   HPI: Jeremy [name] is a 35 y.o. male currently undergoing treatment for his nonischemic cardiomyopathy and edema. GI is constipated for evaluation of anemia. Patient with history of cardiomyopathy , hypertension and diabetes.  Patient seen and examined. Denied any overt bleeding. Denied black stool or blood in the stool. Denied abdominal pain. Complaining of occasional nausea and occasional acid reflux. Denied dysphagia or odynophagia. Denied diarrhea or constipation. Denied any active chest pain.  No prior endoscopy No family history of colon cancer or colon polyps  Past Medical History:  Diagnosis Date  . Abscess of left groin   . Asthma   . Chest pain    a. 01/2015 Lexiscan MV: EF 28%, inferior, inferolateral, apical ischemia;  b. 01/2015 Cath: nl cors, PCWP 18 mmHg, CO 9.38 L/min, CI 3.53 L/min/m^2.  . Essential hypertension   . Hyperlipidemia   . Morbid obesity ([name])   . Nonischemic cardiomyopathy ([name])    a. 01/2015 Echo: EF 20-25%, diff HK, Gr 2 DD, Triv AI, mildly dil LA and Ao root.  . Type II diabetes mellitus ([name])    a. 01/2015 HbA1c = 8.9.    Past Surgical History:  Procedure Laterality Date  . CARDIAC CATHETERIZATION N/A 02/01/2015   Procedure: Right/Left Heart Cath and Coronary Angiography;  Surgeon: Josue Hector, MD;  Location: [facility];  Service: Cardiovascular;  Laterality: N/A;  . THORACOTOMY Left 11/08/2013   Procedure: LEFT THORACOTOMY;  Surgeon: Ivin Poot, MD;  Location: [facility];  Service: Thoracic;  Laterality: Left;    Prior to Admission medications   Medication Sig Start Date End Date Taking? Authorizing Provider  ASPIR-LOW 81 MG EC tablet TAKE 1 TABLET BY MOUTH DAILY. 09/14/15   Amy D Clegg, NP  atorvastatin (LIPITOR) 80 MG tablet Take 1 tablet (80 mg total) by mouth daily. 04/17/16   Shirley Friar, PA-C  carvedilol (COREG) 6.25 MG tablet TAKE 1 & 1/2 TABLETS BY MOUTH TWICE DAILY WITH MEALS 05/13/16   Jolaine Artist, MD  glipiZIDE (GLUCOTROL) 5 MG tablet TAKE 1 TABLET BY MOUTH DAILY. 10/11/15   Amy D Clegg, NP  hydrALAZINE (APRESOLINE) 100 MG tablet Take 1 tablet (100 mg total) by mouth 3 (three) times daily. 04/17/16   Shirley Friar, PA-C  isosorbide mononitrate (IMDUR) 60 MG 24 hr tablet Take 1 tablet (60 mg total) by mouth daily. 05/29/16   Shirley Friar, PA-C  lisinopril (PRINIVIL,ZESTRIL) 20 MG tablet TAKE 1 TABLET (20 MG TOTAL) BY MOUTH DAILY. 04/17/16   Shirley Friar, PA-C  metFORMIN (GLUCOPHAGE) 1000 MG tablet TAKE 1 TABLET (1,000 MG TOTAL) BY MOUTH 2 (TWO) TIMES DAILY WITH A MEAL. 12/11/15   [name], MD  metolazone (ZAROXOLYN) 2.5 MG tablet Take 1 tablet (2.5 mg total) by mouth as directed. 05/15/16   [name], MD  nitroGLYCERIN (NITROSTAT) 0.4 MG SL tablet Place 1 tablet (0.4 mg total) under the tongue every 5 (five) minutes as needed for chest pain. 08/14/15   Amy D Ninfa Meeker, NP  potassium chloride SA (K-DUR,KLOR-CON) 20 MEQ tablet Take 1 tablet (20 mEq total) by mouth daily. 08/14/15   Amy D Ninfa Meeker, NP  spironolactone (ALDACTONE) 25 MG tablet Take 1 tablet (25 mg total) by mouth daily. 08/14/15   Amy D Ninfa Meeker, NP  torsemide (DEMADEX) 20 MG tablet Take 4 tablets (  80 mg total) by mouth daily. May take additional 20 mg of torsemide prn weight gain 02/27/16   Amy D Ninfa Meeker, NP    Scheduled Meds: . furosemide      . amLODipine  5 mg Oral Daily  . aspirin EC  81 mg Oral Daily  . atorvastatin  80 mg Oral Daily  . carvedilol  12.5 mg Oral BID WC  . darbepoetin (ARANESP) injection - NON-DIALYSIS  100 mcg Subcutaneous Q Fri-1800  . enoxaparin (LOVENOX) injection  40 mg Subcutaneous Q24H  . ferumoxytol  510 mg Intravenous Weekly  . hydrALAZINE  100 mg Oral TID  . insulin aspart  0-5 Units Subcutaneous QHS  . insulin aspart  0-9 Units Subcutaneous TID WC   . sodium chloride flush  3 mL Intravenous Q12H   Continuous Infusions: . nitroGLYCERIN 9 mcg/min (05/30/16 0800)   PRN Meds:.sodium chloride, acetaminophen, hydrALAZINE, nitroGLYCERIN, ondansetron (ZOFRAN) IV, oxyCODONE-acetaminophen, sodium chloride flush  Allergies as of 05/29/2016  . (No Known Allergies)    Family History  Problem Relation Age of Onset  . Diabetes Mellitus II Mother     died @ 65.  . Gastric cancer Mother   . CAD Father     died @ 36.  [name] Heart attack Father   . Congestive Heart Failure Father   . Diabetes Mellitus II Sister   . CAD Sister     s/p PCI - age 58.    Social History   Social History  . Marital status: Single    Spouse name: N/A  . Number of children: N/A  . Years of education: N/A   Occupational History  . Not on file.   Social History Main Topics  . Smoking status: Never Smoker  . Smokeless tobacco: Not on file  . Alcohol use No  . Drug use:     Types: Marijuana     Comment: smoked marijuana 2-3 x/wk.  [name] Sexual activity: Yes    Partners: Female   Other Topics Concern  . Not on file   Social History Narrative   Lives in [place] by himself.  Currently in [employer]        Review of Systems: All negative except as stated above in HPI.  Physical Exam: Vital signs: Vitals:   05/30/16 0800 05/30/16 0847  BP: (!) 173/102 (!) 175/97  Pulse: 98   Resp: 20   Temp:       General:   Alert,  Obese pleasant and cooperative in NAD HEENT : Normocephalic atraumatic,  Extraocular movement intact Lungs:  Clear throughout to auscultation.   No wheezes, crackles, or rhonchi. No acute distress. Heart:  Regular rate and rhythm; no murmurs, clicks, rubs,  or gallops. Abdomen: Soft, non-tender, nondistended, bowel sounds present LE : 1 + edema  Rectal:  Deferred  GI:  Lab Results:  Recent Labs  05/29/16 0948 05/30/16 0545  WBC 9.4 7.9  HGB 8.3* 7.5*  HCT 26.9* 24.6*  PLT 374 327   BMET  Recent Labs  05/29/16 0948  05/30/16 0546  NA 143 142  K 4.0 3.6  CL 112* 109  CO2 22 26  GLUCOSE 106* 117*  BUN 40* 36*  CREATININE 3.69* 3.68*  CALCIUM 8.0* 7.6*   LFT  Recent Labs  05/30/16 0546  ALBUMIN 1.1*   PT/INR No results for input(s): LABPROT, INR in the last 72 hours.   Studies/Results: Dg Chest 2 View  Result Date: 05/30/2016 CLINICAL DATA:  Dyspnea EXAM: CHEST  2 VIEW COMPARISON:  05/29/2016 FINDINGS: Cardiomediastinal contours remain unchanged in appearance. The cardiac silhouette is slightly possibly from low lung volumes. There is slight crowding of interstitial lung markings as a result. There are minimal airspace opacities in the lingula. There is an acute displaced left posterior fifth rib fracture without significant change from prior. There is no pneumothorax. No pleural effusion. Minimal pleural thickening versus extrapleural fat near the apices bilaterally. Left IJ central venous catheter tip is in the mid SVC. IMPRESSION: Faint pulmonary opacities in the lingula. Correlate for evolving pneumonia. Left posterior fifth rib fracture without significant callus formation. Electronically Signed   By: Ashley Royalty M.D.   On: 05/30/2016 08:22   US Renal  Result Date: 05/29/2016 CLINICAL DATA:  Acute renal insufficiency. EXAM: RENAL / URINARY TRACT ULTRASOUND COMPLETE COMPARISON:  01/11/2015 FINDINGS: Right Kidney: Length: 15.9 cm. Mildly increased and heterogeneous parenchymal echotexture. No mass or hydronephrosis visualized. Left Kidney: Length: 16.8 cm. Mildly increased and heterogeneous parenchymal echotexture. 2.9 cm simple appearing cyst seen in midpole. No mass or hydronephrosis visualized. Bladder: Appears normal for degree of bladder distention. IMPRESSION: Bilateral renal enlargement with mildly increased and heterogeneous parenchymal echotexture, consistent with acute medical renal disease. No evidence of hydronephrosis. Electronically Signed   By: Earle Gell M.D.   On: 05/29/2016 19:35    Dg Chest Portable 1 View  Result Date: 05/29/2016 CLINICAL DATA:  Central line placement. EXAM: PORTABLE CHEST 1 VIEW COMPARISON:  11/23/2013 FINDINGS: Left IJ center venous catheter tip is in the mid distal SVC just below the carina. Low lung volumes with vascular crowding and atelectasis. Accentuation of heart size due to low lung volumes and the AP projection and portable technique. No edema or effusions. IMPRESSION: Left IJ center venous catheter in good position without complicating features. Low lung volumes with vascular crowding and streaky atelectasis. Electronically Signed   By: Marijo Sanes M.D.   On: 05/29/2016 17:45    Impression/Plan: Normocytic anemia. Patient was also anemic back in March 2015 with hemoglobin of around 8.6 Nonischemic cardia myopathy Obesity Diabetes  Recommendations -------------------------- - Iron studies reviewed. Shows low iron and low TIBC consistent with anemia of chronic disease. Iron studies in May 2016 showed low TIBC and ferritin of 80.  - Follow stool for occult blood - We will get ferritin. He has normal vitamin B-12 level - GI will follow.    LOS: 1 day   Otis Brace  MD, FACP 05/30/2016, 10:42 AM  Pager [phone] If no answer or after 5 PM call [phone]

## 2016-05-30 NOTE — Progress Notes (Signed)
[facility] Rounding Note  Subjective/Interval History:  No new complaints Diuresing with 80 IV lasix BID - Dr. Aundra Dubin is holding additional doses d/t CVP of 4 (all I can find is CVP of 9) Creatinine unchanged from admission  Objective Vital signs in last 24 hours: Vitals:   05/30/16 0700 05/30/16 0703 05/30/16 0750 05/30/16 0847  BP: (!) 191/118 (!) 174/116  (!) 175/97  Pulse:  100    Resp: (!) 27 (!) 26    Temp:   97.5 F (36.4 C)   TempSrc:   Oral   SpO2:  100%    Weight:      Height:       Weight change:   Intake/Output Summary (Last 24 hours) at 05/30/16 0954 Last data filed at 05/30/16 0800  Gross per 24 hour  Intake          1061.51 ml  Output             3150 ml  Net         -2088.49 ml   Physical Exam:  Blood pressure (!) 175/97, pulse 100, temperature 97.5 F (36.4 C), temperature source Oral, resp. rate (!) 26, height 6\' 5"  (1.956 m), weight (!) 157.4 kg (347 lb 1.6 oz), SpO2 100 %.  Very mice MO AAM NAD Cannot see his neck veins at all Lungs clear S1S2 No S3 Obese abdomen with some pitting edema of the abd wall 2+ LE pitting edema  Labs:   Recent Labs Lab 05/29/16 0948 05/30/16 0546  NA 143 142  K 4.0 3.6  CL 112* 109  CO2 22 26  GLUCOSE 106* 117*  BUN 40* 36*  CREATININE 3.69* 3.68*  CALCIUM 8.0* 7.6*  PHOS  --  4.9*     Recent Labs Lab 05/30/16 0546  ALBUMIN 1.1*     Recent Labs Lab 05/29/16 0948 05/30/16 0545  WBC 9.4 7.9  NEUTROABS  --  5.2  HGB 8.3* 7.5*  HCT 26.9* 24.6*  MCV 81.3 80.9  PLT 374 327     Recent Labs Lab 05/29/16 2249 05/30/16 0748  GLUCAP 117* 132*    Iron/TIBC/Ferritin/ %Sat    Component Value Date/Time   IRON 30 (L) 05/29/2016 1630   TIBC 116 (L) 05/29/2016 1630   FERRITIN 80 01/09/2015 1634   IRONPCTSAT 26 05/29/2016 1630    Results for ELISON, WORREL (MRN [id number]) as of 05/30/2016 09:26  11/09/2013 09:01 12/31/2014 17:22 05/29/2016 19:06  Appearance CLOUDY (A) CLOUDY (A) CLEAR  Bacteria, UA  RARE RARE RARE (A)  Bilirubin Urine NEGATIVE NEGATIVE NEGATIVE  Casts  HYALINE CASTS (A) HYALINE CASTS (A)  Color, Urine YELLOW YELLOW YELLOW  Glucose 100 (A) 250 (A) 100 (A)  Hgb urine dipstick SMALL (A) LARGE (A) SMALL (A)  Ketones, ur NEGATIVE NEGATIVE NEGATIVE  Leukocytes, UA NEGATIVE NEGATIVE NEGATIVE  Nitrite NEGATIVE NEGATIVE NEGATIVE  pH 5.5 5.5 7.0  Protein NEGATIVE >300 (A) >300 (A)  RBC / HPF 3-6 7-10 6-30  Specific Gravity, Urine 1.027 1.030 1.018  Squamous Epithelial / LPF RARE RARE 0-5 (A)  Urine-Other   MUCOUS PRESENT  Urobilinogen, UA 0.2 0.2   WBC, UA  0-2 0-5   Studies/Results: Dg Chest 2 View  Result Date: 05/30/2016 CLINICAL DATA:  Dyspnea EXAM: CHEST  2 VIEW COMPARISON:  05/29/2016 FINDINGS: Cardiomediastinal contours remain unchanged in appearance. The cardiac silhouette is slightly possibly from low lung volumes. There is slight crowding of interstitial lung markings as a result. There are minimal airspace  opacities in the lingula. There is an acute displaced left posterior fifth rib fracture without significant change from prior. There is no pneumothorax. No pleural effusion. Minimal pleural thickening versus extrapleural fat near the apices bilaterally. Left IJ central venous catheter tip is in the mid SVC. IMPRESSION: Faint pulmonary opacities in the lingula. Correlate for evolving pneumonia. Left posterior fifth rib fracture without significant callus formation. Electronically Signed   By: Ashley Royalty M.D.   On: 05/30/2016 08:22   US Renal  Result Date: 05/29/2016 CLINICAL DATA:  Acute renal insufficiency. EXAM: RENAL / URINARY TRACT ULTRASOUND COMPLETE COMPARISON:  01/11/2015 FINDINGS: Right Kidney: Length: 15.9 cm. Mildly increased and heterogeneous parenchymal echotexture. No mass or hydronephrosis visualized. Left Kidney: Length: 16.8 cm. Mildly increased and heterogeneous parenchymal echotexture. 2.9 cm simple appearing cyst seen in midpole. No mass or  hydronephrosis visualized. Bladder: Appears normal for degree of bladder distention. IMPRESSION: Bilateral renal enlargement with mildly increased and heterogeneous parenchymal echotexture, consistent with acute medical renal disease. No evidence of hydronephrosis. Electronically Signed   By: Earle Gell M.D.   On: 05/29/2016 19:35   Dg Chest Portable 1 View  Result Date: 05/29/2016 CLINICAL DATA:  Central line placement. EXAM: PORTABLE CHEST 1 VIEW COMPARISON:  11/23/2013 FINDINGS: Left IJ center venous catheter tip is in the mid distal SVC just below the carina. Low lung volumes with vascular crowding and atelectasis. Accentuation of heart size due to low lung volumes and the AP projection and portable technique. No edema or effusions. IMPRESSION: Left IJ center venous catheter in good position without complicating features. Low lung volumes with vascular crowding and streaky atelectasis. Electronically Signed   By: Marijo Sanes M.D.   On: 05/29/2016 17:45   Medications: . nitroGLYCERIN 30 mcg/min (05/30/16 0648)   . furosemide      . amLODipine  5 mg Oral Daily  . aspirin EC  81 mg Oral Daily  . atorvastatin  80 mg Oral Daily  . carvedilol  12.5 mg Oral BID WC  . darbepoetin (ARANESP) injection - NON-DIALYSIS  100 mcg Subcutaneous Q Fri-1800  . enoxaparin (LOVENOX) injection  40 mg Subcutaneous Q24H  . ferumoxytol  510 mg Intravenous Weekly  . hydrALAZINE  100 mg Oral TID  . insulin aspart  0-5 Units Subcutaneous QHS  . insulin aspart  0-9 Units Subcutaneous TID WC  . sodium chloride flush  3 mL Intravenous Q12H    Background:   35 year old black male with DM, HTN and cardiomyopathy who presents with AKI on CKD in the setting of volume overload and malignant HTN. Baseline creatinine 1.5-1.6 during 2017, up to 3.89 on admission with marked hypoalbuminemia, microhematuria since 2016.  We have been asked to see because of his AKI.  Assessment/Recommendations:  1. AKI on CKD3 -  progression of disease past 2 years, more rapid past 3 months. May be hemodynamically mediated by [facility] HTN/heart failure, but he has very low alb (1.1, and was 1.4 in 2016), large kidneys on [place], has had proteinuria on UA since 12/2014 (no proteinuria 2015) and his microhematuria seems more significant on most recent UA's.  Have to wonder about underlying GN as cause of nephrotic syndrome and renal failure.  Possiblities that come to mind - FSGS (idiopathic or obesity related), membranous, amyloid, DM seems less likely, vs other.  Agree that with low CVP's have to be careful with diuresis as his (presumed) nephrotic syndrome will result in a relative smaller intravascular volume with anasarca. 1. I  am OK with holding diuretic dosing after AM dose today as Dr. Aundra Dubin has done to allow re-equilibration with ECFV/intravascular volume - but we wouldn't necessarily have to stop them in the future unless creatinine rises further... 2. Serologies ordered including SPEP, UPEP, ANA, ANCA, serum FLC's plus UPC to quantitate proteinuria.  Hep B and C already ordered. 3. He may require diagnostic renal biopsy first of the week depending on all these results 2.  Volume overload/anasarca - CVP's lowish. No SOB. See #1  3.   Anemia - normal Hb last year. No blood loss reported. Total iron low (tsat normal because TIBC so low) . Feraheme X 2 doses and Aranesp 100 mcg/week for now  4.   DM - metformin on hold. SSI for now 5.  HTN - carvedilol/hydralazine/amlodipine. Primary team JUST increased carvedilol this AM. Follow BP's/.   Jamal Maes, MD [facility] [phone] pager 05/30/2016, 9:54 AM

## 2016-05-31 DIAGNOSIS — I169 Hypertensive crisis, unspecified: Secondary | ICD-10-CM

## 2016-05-31 DIAGNOSIS — N179 Acute kidney failure, unspecified: Secondary | ICD-10-CM

## 2016-05-31 LAB — RENAL FUNCTION PANEL
ALBUMIN: 1.2 g/dL — AB (ref 3.5–5.0)
Albumin: 1.2 g/dL — ABNORMAL LOW (ref 3.5–5.0)
Anion gap: 10 (ref 5–15)
Anion gap: 9 (ref 5–15)
BUN: 32 mg/dL — AB (ref 6–20)
BUN: 32 mg/dL — AB (ref 6–20)
CALCIUM: 8.2 mg/dL — AB (ref 8.9–10.3)
CHLORIDE: 109 mmol/L (ref 101–111)
CO2: 25 mmol/L (ref 22–32)
CO2: 24 mmol/L (ref 22–32)
CREATININE: 3.56 mg/dL — AB (ref 0.61–1.24)
Calcium: 8.1 mg/dL — ABNORMAL LOW (ref 8.9–10.3)
Chloride: 110 mmol/L (ref 101–111)
Creatinine, Ser: 3.59 mg/dL — ABNORMAL HIGH (ref 0.61–1.24)
GFR calc Af Amer: 24 mL/min — ABNORMAL LOW (ref >60)
GFR calc Af Amer: 24 mL/min — ABNORMAL LOW (ref >60)
GFR, EST NON AFRICAN AMERICAN: 20 mL/min — AB (ref >60)
GFR, EST NON AFRICAN AMERICAN: 21 mL/min — AB (ref >60)
GLUCOSE: 130 mg/dL — AB (ref 65–99)
Glucose, Bld: 131 mg/dL — ABNORMAL HIGH (ref 65–99)
PHOSPHORUS: 4.5 mg/dL (ref 2.5–4.6)
POTASSIUM: 3.6 mmol/L (ref 3.5–5.1)
Phosphorus: 4.4 mg/dL (ref 2.5–4.6)
Potassium: 3.6 mmol/L (ref 3.5–5.1)
SODIUM: 144 mmol/L (ref 135–145)
Sodium: 143 mmol/L (ref 135–145)

## 2016-05-31 LAB — CBC WITH DIFFERENTIAL/PLATELET
BASOS ABS: 0 10*3/uL (ref 0.0–0.1)
BASOS PCT: 0 %
EOS ABS: 0.1 10*3/uL (ref 0.0–0.7)
EOS PCT: 1 %
HCT: 26 % — ABNORMAL LOW (ref 39.0–52.0)
Hemoglobin: 7.9 g/dL — ABNORMAL LOW (ref 13.0–17.0)
LYMPHS PCT: 17 %
Lymphs Abs: 1.7 10*3/uL (ref 0.7–4.0)
MCH: 24.5 pg — ABNORMAL LOW (ref 26.0–34.0)
MCHC: 30.4 g/dL (ref 30.0–36.0)
MCV: 80.7 fL (ref 78.0–100.0)
MONO ABS: 0.8 10*3/uL (ref 0.1–1.0)
Monocytes Relative: 8 %
Neutro Abs: 7 10*3/uL (ref 1.7–7.7)
Neutrophils Relative %: 74 %
PLATELETS: 392 10*3/uL (ref 150–400)
RBC: 3.22 MIL/uL — AB (ref 4.22–5.81)
RDW: 14.9 % (ref 11.5–15.5)
WBC: 9.6 10*3/uL (ref 4.0–10.5)

## 2016-05-31 LAB — FERRITIN: FERRITIN: 213 ng/mL (ref 24–336)

## 2016-05-31 LAB — GLUCOSE, CAPILLARY
GLUCOSE-CAPILLARY: 151 mg/dL — AB (ref 65–99)
GLUCOSE-CAPILLARY: 149 mg/dL — AB (ref 65–99)
Glucose-Capillary: 151 mg/dL — ABNORMAL HIGH (ref 65–99)
Glucose-Capillary: 131 mg/dL — ABNORMAL HIGH (ref 65–99)

## 2016-05-31 LAB — ANTI-DNA ANTIBODY, DOUBLE-STRANDED: ds DNA Ab: <1 IU/mL (ref 0–9)

## 2016-05-31 LAB — C4 COMPLEMENT: Complement C4, Body Fluid: 31 mg/dL (ref 14–44)

## 2016-05-31 LAB — HEPATITIS C ANTIBODY

## 2016-05-31 LAB — CARBOXYHEMOGLOBIN
CARBOXYHEMOGLOBIN: 1.3 % (ref 0.5–1.5)
Methemoglobin: 1.5 % (ref 0.0–1.5)
O2 Saturation: 66.6 %
TOTAL HEMOGLOBIN: 8.1 g/dL — AB (ref 12.0–16.0)

## 2016-05-31 LAB — CREATININE, URINE, 24 HOUR
COLLECTION INTERVAL-UCRE24: 24 h
Creatinine, 24H Ur: 2730 mg/d — ABNORMAL HIGH (ref 800–2000)
Creatinine, Urine: 62.04 mg/dL
Urine Total Volume-UCRE24: 4400 mL

## 2016-05-31 LAB — C3 COMPLEMENT: C3 Complement: 139 mg/dL (ref 82–167)

## 2016-05-31 LAB — HEPATITIS B SURFACE ANTIGEN: HEP B S AG: NEGATIVE

## 2016-05-31 LAB — COMPLEMENT, TOTAL: COMPL TOTAL (CH50): 58 U/mL (ref 42–60)

## 2016-05-31 LAB — MPO/PR-3 (ANCA) ANTIBODIES: Myeloperoxidase Abs: <9 U/mL (ref 0.0–9.0)

## 2016-05-31 MED ORDER — SORBITOL 70 % SOLN
30.0000 mL | Status: DC | PRN
  Administered 2016-05-31: 30 mL via ORAL
  Filled 2016-05-31: qty 30

## 2016-05-31 MED ORDER — SODIUM CHLORIDE 0.9% FLUSH
10.0000 mL | Freq: Two times a day (BID) | INTRAVENOUS | Status: DC
  Administered 2016-05-31 – 2016-06-01 (×4): 10 mL
  Administered 2016-06-02 (×2): 20 mL
  Administered 2016-06-03: 10 mL

## 2016-05-31 MED ORDER — FUROSEMIDE 10 MG/ML IJ SOLN
80.0000 mg | Freq: Two times a day (BID) | INTRAMUSCULAR | Status: DC
  Administered 2016-05-31 – 2016-06-01 (×4): 80 mg via INTRAVENOUS
  Filled 2016-05-31 (×4): qty 8

## 2016-05-31 MED ORDER — CLONIDINE HCL 0.1 MG PO TABS
0.1000 mg | ORAL_TABLET | Freq: Two times a day (BID) | ORAL | Status: DC
  Administered 2016-05-31 – 2016-06-01 (×3): 0.1 mg via ORAL
  Filled 2016-05-31 (×3): qty 1

## 2016-05-31 MED ORDER — CARVEDILOL 25 MG PO TABS
25.0000 mg | ORAL_TABLET | Freq: Two times a day (BID) | ORAL | Status: DC
  Administered 2016-05-31 – 2016-06-03 (×6): 25 mg via ORAL
  Filled 2016-05-31 (×6): qty 1

## 2016-05-31 MED ORDER — AMLODIPINE BESYLATE 10 MG PO TABS
10.0000 mg | ORAL_TABLET | Freq: Every day | ORAL | Status: DC
  Administered 2016-06-01: 10 mg via ORAL
  Filled 2016-05-31: qty 1

## 2016-05-31 MED ORDER — SODIUM CHLORIDE 0.9% FLUSH: 10.0000 mL | INTRAVENOUS | Status: DC | PRN

## 2016-05-31 NOTE — Progress Notes (Signed)
[facility] Rounding Note  Subjective/Interval History:   No new complaints 1.8 liter UOP/24 hours Last dose of 80 mg lasix was yesterday AM Creatinine unchanged from admission  Objective Vital signs in last 24 hours: Vitals:   05/31/16 0700 05/31/16 0800 05/31/16 0900 05/31/16 0941  BP: (!) 163/96 (!) 191/117 (!) 168/114 (!) 175/100  Pulse: 89 84 87   Resp: 14 17 (!) 21   Temp:  98.1 F (36.7 C)    TempSrc:  Oral    SpO2: 99% 98% 97%   Weight:      Height:       Weight change: -3.084 kg (-6 lb 12.8 oz)  Intake/Output Summary (Last 24 hours) at 05/31/16 0956 Last data filed at 05/31/16 0900  Gross per 24 hour  Intake           903.93 ml  Output             1725 ml  Net          -821.07 ml   Physical Exam:  Blood pressure (!) 175/100, pulse 87, temperature 98.1 F (36.7 C), temperature source Oral, resp. rate (!) 21, height 6\' 5"  (1.956 m), weight (!) 156 kg (343 lb 14.4 oz), SpO2 97 %.  CVP 8 Very mice MO AAM NAD Cannot see his neck veins at all Lungs clear S1S2 No S3 Obese abdomen with some pitting edema of the abd wall 2+ LE pitting edema  Labs:   Recent Labs Lab 05/29/16 0948 05/30/16 0546 05/31/16 0500  NA 143 142 144  143  K 4.0 3.6 3.6  3.6  CL 112* 109 110  109  CO2 22 26 25  24   GLUCOSE 106* 117* 130*  131*  BUN 40* 36* 32*  32*  CREATININE 3.69* 3.68* 3.56*  3.59*  CALCIUM 8.0* 7.6* 8.2*  8.1*  PHOS  --  4.9* 4.5  4.4     Recent Labs Lab 05/30/16 0546 05/31/16 0500  ALBUMIN 1.1* 1.2*  1.2*     Recent Labs Lab 05/29/16 0948 05/30/16 0545 05/31/16 0500  WBC 9.4 7.9 9.6  NEUTROABS  --  5.2 7.0  HGB 8.3* 7.5* 7.9*  HCT 26.9* 24.6* 26.0*  MCV 81.3 80.9 80.7  PLT 374 327 392     Recent Labs Lab 05/30/16 0748 05/30/16 1140 05/30/16 1624 05/30/16 2116 05/31/16 0729  GLUCAP 132* 166* 148* 170* 151*    Iron/TIBC/Ferritin/ %Sat    Component Value Date/Time   IRON 30 (L) 05/29/2016 1630   TIBC 116 (L) 05/29/2016 1630   FERRITIN 80 01/09/2015 1634   IRONPCTSAT 26 05/29/2016 1630    Results for NAZIAH, WECKERLY (MRN [id number]) as of 05/30/2016 09:26  11/09/2013 09:01 12/31/2014 17:22 05/29/2016 19:06  Appearance CLOUDY (A) CLOUDY (A) CLEAR  Bacteria, UA RARE RARE RARE (A)  Bilirubin Urine NEGATIVE NEGATIVE NEGATIVE  Casts  HYALINE CASTS (A) HYALINE CASTS (A)  Color, Urine YELLOW YELLOW YELLOW  Glucose 100 (A) 250 (A) 100 (A)  Hgb urine dipstick SMALL (A) LARGE (A) SMALL (A)  Ketones, ur NEGATIVE NEGATIVE NEGATIVE  Leukocytes, UA NEGATIVE NEGATIVE NEGATIVE  Nitrite NEGATIVE NEGATIVE NEGATIVE  pH 5.5 5.5 7.0  Protein NEGATIVE >300 (A) >300 (A)  RBC / HPF 3-6 7-10 6-30  Specific Gravity, Urine 1.027 1.030 1.018  Squamous Epithelial / LPF RARE RARE 0-5 (A)  Urine-Other   MUCOUS PRESENT  Urobilinogen, UA 0.2 0.2   WBC, UA  0-2 0-5    Results for Lounsbury, Xaine (  MRN [id number]) as of 05/31/2016 09:54  Ref. Range 05/30/2016 11:57  C3 Complement Latest Ref Range: 82 - 167 mg/dL 139  Compl, Total (CH50) Latest Ref Range: 42 - 60 U/mL 58  Complement C4, Body Fluid Latest Ref Range: 14 - 44 mg/dL 31   Results for CLAUDIS, GIOVANELLI (MRN [id number]) as of 05/31/2016 09:54  Ref. Range 05/30/2016 11:57 05/30/2016 12:46  Hepatitis B Surface Ag Latest Ref Range: Negative   Negative  HCV Ab Latest Ref Range: 0.0 - 0.9 s/co ratio <0.1    Results for JAKOBI, THETFORD (MRN [id number]) as of 05/31/2016 09:54  05/30/2016 12:29  Protein Creatinine Ratio 12.96 (H)   ANA, antiDNA, ANCA, anti GBM, serum free light chains, urine immunofixation, SPEP all pending  Studies/Results: Dg Chest 2 View  Result Date: 05/30/2016 CLINICAL DATA:  Dyspnea EXAM: CHEST  2 VIEW COMPARISON:  05/29/2016 FINDINGS: Cardiomediastinal contours remain unchanged in appearance. The cardiac silhouette is slightly possibly from low lung volumes. There is slight crowding of interstitial lung markings as a result. There are minimal airspace opacities in the  lingula. There is an acute displaced left posterior fifth rib fracture without significant change from prior. There is no pneumothorax. No pleural effusion. Minimal pleural thickening versus extrapleural fat near the apices bilaterally. Left IJ central venous catheter tip is in the mid SVC. IMPRESSION: Faint pulmonary opacities in the lingula. Correlate for evolving pneumonia. Left posterior fifth rib fracture without significant callus formation. Electronically Signed   By: Ashley Royalty M.D.   On: 05/30/2016 08:22   US Renal  Result Date: 05/29/2016 CLINICAL DATA:  Acute renal insufficiency. EXAM: RENAL / URINARY TRACT ULTRASOUND COMPLETE COMPARISON:  01/11/2015 FINDINGS: Right Kidney: Length: 15.9 cm. Mildly increased and heterogeneous parenchymal echotexture. No mass or hydronephrosis visualized. Left Kidney: Length: 16.8 cm. Mildly increased and heterogeneous parenchymal echotexture. 2.9 cm simple appearing cyst seen in midpole. No mass or hydronephrosis visualized. Bladder: Appears normal for degree of bladder distention. IMPRESSION: Bilateral renal enlargement with mildly increased and heterogeneous parenchymal echotexture, consistent with acute medical renal disease. No evidence of hydronephrosis. Electronically Signed   By: Earle Gell M.D.   On: 05/29/2016 19:35   Dg Chest Portable 1 View  Result Date: 05/29/2016 CLINICAL DATA:  Central line placement. EXAM: PORTABLE CHEST 1 VIEW COMPARISON:  11/23/2013 FINDINGS: Left IJ center venous catheter tip is in the mid distal SVC just below the carina. Low lung volumes with vascular crowding and atelectasis. Accentuation of heart size due to low lung volumes and the AP projection and portable technique. No edema or effusions. IMPRESSION: Left IJ center venous catheter in good position without complicating features. Low lung volumes with vascular crowding and streaky atelectasis. Electronically Signed   By: Marijo Sanes M.D.   On: 05/29/2016 17:45    ECHO  - done and results pending  Medications: . nitroGLYCERIN 45 mcg/min (05/31/16 0630)   . amLODipine  5 mg Oral Daily  . aspirin EC  81 mg Oral Daily  . atorvastatin  80 mg Oral Daily  . carvedilol  12.5 mg Oral BID WC  . darbepoetin (ARANESP) injection - NON-DIALYSIS  100 mcg Subcutaneous Q Fri-1800  . enoxaparin (LOVENOX) injection  40 mg Subcutaneous Q24H  . ferumoxytol  510 mg Intravenous Weekly  . hydrALAZINE  100 mg Oral TID  . insulin aspart  0-5 Units Subcutaneous QHS  . insulin aspart  0-9 Units Subcutaneous TID WC  . sodium chloride flush  10-40 mL Intracatheter Q12H  .  sodium chloride flush  3 mL Intravenous Q12H    Background:   35 year old black male with DM, HTN and cardiomyopathy who presents with AKI on CKD in the setting of volume overload and malignant HTN. Baseline creatinine 1.5-1.6 during 2017, up to 3.89 on admission with marked hypoalbuminemia, microhematuria since 2016.  We have been asked to see because of his AKI.    Assessment/Recommendations:  1. AKI on CKD3 - progression of disease past 2 years, more rapid past 3 months. May be hemodynamically mediated by [facility] HTN/heart failure, but he has very low alb (1.1, and was 1.4 in 2016), large kidneys on [place], has had proteinuria on UA only since 12/2014 (no proteinuria 2015) and his microhematuria seems more significant on most recent UA's.  UPC confirms nephrotic range proteinuria at just over 12 grams. 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  Possiblities include FSGS (idiopathic or obesity related), membranous, amyloid, DM seems less likely (given no proteinuria until 2016), vs other.  Agree that with low CVP's have to be careful with diuresis as his nephrotic syndrome will result in a relative smaller intravascular volume with anasarca. 1. Pending tests include SPEP, UPEP, ANA, ANCA, serum FLC's.  Hep B and C and complements are all normal.    2. He will need diagnostic renal  biopsy - will try to get this set up for Monday (if can get BP down). 3. Hold ASA 4. Hold Lovenox after today's dose  2.  Volume overload/anasarca  1. Resume furosemide 80 Q12H and will keep an eye on CVP .  3.   Anemia - normal Hb last year. No blood loss reported. Total iron low (tsat normal because TIBC so low) .  1. Feraheme X 2 doses 2. Aranesp 100 mcg/week for now  3. Trend Hb  4.   DM  1. metformin on hold.  1. SSI for now  5.  HTN - carvedilol/hydralazine/amlodipine.  1. BP not controlled 2. Increase amlodipine to 10, carvedilol to [place], MD [facility] [phone] pager 05/31/2016, 9:56 AM

## 2016-05-31 NOTE — Consult Note (Signed)
Chief Complaint: Patient was seen in consultation today for image guided random renal biopsy  Referring Physician(s): Dunham,C  Supervising Physician: Jacqulynn Cadet  Patient Status: Inpatient  History of Present Illness: Jeremy [name] is a 35 y.o. male with history of diabetes, obesity, anemia, hypertension and cardiomyopathy who recently presented to the hospital with acute on chronic renal failure in the setting of volume overload and malignant hypertension. Now with nephrotic range proteinuria. Request received from nephrology for image guided random renal biopsy for further evaluation.  Past Medical History:  Diagnosis Date  . Abscess of left groin   . Asthma   . Chest pain    a. 01/2015 Lexiscan MV: EF 28%, inferior, inferolateral, apical ischemia;  b. 01/2015 Cath: nl cors, PCWP 18 mmHg, CO 9.38 L/min, CI 3.53 L/min/m^2.  . Essential hypertension   . Hyperlipidemia   . Morbid obesity ([place])   . Nonischemic cardiomyopathy ([place])    a. 01/2015 Echo: EF 20-25%, diff HK, Gr 2 DD, Triv AI, mildly dil LA and Ao root.  . Type II diabetes mellitus ([place])    a. 01/2015 HbA1c = 8.9.    Past Surgical History:  Procedure Laterality Date  . CARDIAC CATHETERIZATION N/A 02/01/2015   Procedure: Right/Left Heart Cath and Coronary Angiography;  Surgeon: Josue Hector, MD;  Location: [place] [facility];  Service: Cardiovascular;  Laterality: N/A;  . THORACOTOMY Left 11/08/2013   Procedure: LEFT THORACOTOMY;  Surgeon: Ivin Poot, MD;  Location: [place];  Service: Thoracic;  Laterality: Left;    Allergies: Review of patient's allergies indicates no known allergies.  Medications: Prior to Admission medications   Medication Sig Start Date End Date Taking? Authorizing Provider  ASPIR-LOW 81 MG EC tablet TAKE 1 TABLET BY MOUTH DAILY. Patient taking differently: Take 81 mg by mouth daily 09/14/15  Yes Amy D Clegg, NP  atorvastatin (LIPITOR) 80 MG tablet Take 1 tablet (80 mg total) by  mouth daily. 04/17/16  Yes Shirley Friar, PA-C  carvedilol (COREG) 6.25 MG tablet TAKE 1 & 1/2 TABLETS BY MOUTH TWICE DAILY WITH MEALS Patient taking differently: Take 9.375 mg by mouth 2 (two) times daily with a meal.  05/13/16  Yes Shaune Pascal Bensimhon, MD  glipiZIDE (GLUCOTROL) 5 MG tablet TAKE 1 TABLET BY MOUTH DAILY. Patient taking differently: Take 5 mg by mouth daily 10/11/15  Yes Amy D Clegg, NP  hydrALAZINE (APRESOLINE) 100 MG tablet Take 1 tablet (100 mg total) by mouth 3 (three) times daily. 04/17/16  Yes Shirley Friar, PA-C  isosorbide mononitrate (IMDUR) 60 MG 24 hr tablet Take 1 tablet (60 mg total) by mouth daily. 05/29/16  Yes Shirley Friar, PA-C  lisinopril (PRINIVIL,ZESTRIL) 20 MG tablet TAKE 1 TABLET (20 MG TOTAL) BY MOUTH DAILY. 04/17/16  Yes Shirley Friar, PA-C  metFORMIN (GLUCOPHAGE) 1000 MG tablet TAKE 1 TABLET (1,000 MG TOTAL) BY MOUTH 2 (TWO) TIMES DAILY WITH A MEAL. 12/11/15  Yes [name], MD  metolazone (ZAROXOLYN) 2.5 MG tablet Take 1 tablet (2.5 mg total) by mouth as directed. 05/15/16  Yes [name], MD  nitroGLYCERIN (NITROSTAT) 0.4 MG SL tablet Place 1 tablet (0.4 mg total) under the tongue every 5 (five) minutes as needed for chest pain. 08/14/15  Yes Amy D Clegg, NP  potassium chloride SA (K-DUR,KLOR-CON) 20 MEQ tablet Take 1 tablet (20 mEq total) by mouth daily. 08/14/15  Yes Amy D Clegg, NP  spironolactone (ALDACTONE) 25 MG tablet Take 1 tablet (  25 mg total) by mouth daily. 08/14/15  Yes Amy D Clegg, NP  torsemide (DEMADEX) 20 MG tablet Take 4 tablets (80 mg total) by mouth daily. May take additional 20 mg of torsemide prn weight gain 02/27/16  Yes Amy D Clegg, NP     Family History  Problem Relation Age of Onset  . Diabetes Mellitus II Mother     died @ 45.  . Gastric cancer Mother   . CAD Father     died @ 15.  [name] Heart attack Father   . Congestive Heart Failure Father   . Diabetes Mellitus II Sister   . CAD Sister     s/p  PCI - age 30.    Social History   Social History  . Marital status: Single    Spouse name: N/A  . Number of children: N/A  . Years of education: N/A   Social History Main Topics  . Smoking status: Never Smoker  . Smokeless tobacco: None  . Alcohol use No  . Drug use:     Types: Marijuana     Comment: smoked marijuana 2-3 x/wk.  [name] Sexual activity: Yes    Partners: Female   Other Topics Concern  . None   Social History Narrative   Lives in [place] by himself.  Currently in [facility].          Review of Systems currently denies fever, chest pain, worsening dyspnea, cough, abdominal/back pain, vomiting or abnormal bleeding. Has have occasional headaches as well as intermittent nausea.  Vital Signs:  BP (!) 174/118   Pulse 91   Temp 98.2 F (36.8 C) (Oral)   Resp (!) 23   Ht 6\' 5"  (1.956 m)   Wt (!) 343 lb 14.4 oz (156 kg)   SpO2 98%   BMI 40.78 kg/m   Physical Exam awake, alert. Chest clear to auscultation bilaterally; heart with regular rate and rhythm; abdomen obese, soft, positive bowel sounds, nontender. Trace lower extremity edema bilaterally  Mallampati Score:     Imaging: Dg Chest 2 View  Result Date: 05/30/2016 CLINICAL DATA:  Dyspnea EXAM: CHEST  2 VIEW COMPARISON:  05/29/2016 FINDINGS: Cardiomediastinal contours remain unchanged in appearance. The cardiac silhouette is slightly possibly from low lung volumes. There is slight crowding of interstitial lung markings as a result. There are minimal airspace opacities in the lingula. There is an acute displaced left posterior fifth rib fracture without significant change from prior. There is no pneumothorax. No pleural effusion. Minimal pleural thickening versus extrapleural fat near the apices bilaterally. Left IJ central venous catheter tip is in the mid SVC. IMPRESSION: Faint pulmonary opacities in the lingula. Correlate for evolving pneumonia. Left posterior fifth rib fracture without significant callus  formation. Electronically Signed   By: Ashley Royalty M.D.   On: 05/30/2016 08:22   US Renal  Result Date: 05/29/2016 CLINICAL DATA:  Acute renal insufficiency. EXAM: RENAL / URINARY TRACT ULTRASOUND COMPLETE COMPARISON:  01/11/2015 FINDINGS: Right Kidney: Length: 15.9 cm. Mildly increased and heterogeneous parenchymal echotexture. No mass or hydronephrosis visualized. Left Kidney: Length: 16.8 cm. Mildly increased and heterogeneous parenchymal echotexture. 2.9 cm simple appearing cyst seen in midpole. No mass or hydronephrosis visualized. Bladder: Appears normal for degree of bladder distention. IMPRESSION: Bilateral renal enlargement with mildly increased and heterogeneous parenchymal echotexture, consistent with acute medical renal disease. No evidence of hydronephrosis. Electronically Signed   By: Earle Gell M.D.   On: 05/29/2016 19:35   Dg Chest Portable 1 View  Result Date: 05/29/2016 CLINICAL DATA:  Central line placement. EXAM: PORTABLE CHEST 1 VIEW COMPARISON:  11/23/2013 FINDINGS: Left IJ center venous catheter tip is in the mid distal SVC just below the carina. Low lung volumes with vascular crowding and atelectasis. Accentuation of heart size due to low lung volumes and the AP projection and portable technique. No edema or effusions. IMPRESSION: Left IJ center venous catheter in good position without complicating features. Low lung volumes with vascular crowding and streaky atelectasis. Electronically Signed   By: Marijo Sanes M.D.   On: 05/29/2016 17:45    Labs:  CBC:  Recent Labs  05/29/16 0948 05/30/16 0545 05/31/16 0500  WBC 9.4 7.9 9.6  HGB 8.3* 7.5* 7.9*  HCT 26.9* 24.6* 26.0*  PLT 374 327 392    COAGS: No results for input(s): INR, APTT in the last 8760 hours.  BMP:  Recent Labs  02/27/16 1115 05/29/16 0948 05/30/16 0546 05/31/16 0500  NA 142 143 142 144  143  K 3.8 4.0 3.6 3.6  3.6  CL 109 112* 109 110  109  CO2 27 22 26 25  24   GLUCOSE 156* 106* 117*  130*  131*  BUN 34* 40* 36* 32*  32*  CALCIUM 8.8* 8.0* 7.6* 8.2*  8.1*  CREATININE 1.67* 3.69* 3.68* 3.56*  3.59*  GFRNONAA 52* 20* 20* 21*  20*  GFRAA 60* 23* 23* 24*  24*    LIVER FUNCTION TESTS:  Recent Labs  05/30/16 0546 05/31/16 0500  ALBUMIN 1.1* 1.2*  1.2*    TUMOR MARKERS: No results for input(s): AFPTM, CEA, CA199, CHROMGRNA in the last 8760 hours.  Assessment and Plan: 34 y.o. male with history of diabetes, obesity, anemia, hypertension and cardiomyopathy who recently presented to the hospital with acute on chronic renal failure in the setting of volume overload and malignant hypertension. Now with nephrotic range proteinuria. Renal ultrasound 05/29/16 revealed bilateral renal enlargement with mildly increased and heterogeneous parenchymal echotexture consistent with acute medical renal disease. There was no evidence of hydronephrosis. Request received from nephrology for image guided random renal biopsy for further evaluation.Risks and benefits discussed with the patient/family including, but not limited to bleeding, infection, damage to adjacent structures or low yield requiring additional tests.All of the patient's questions were answered, patient is agreeable to proceed.Consent signed and in chart. Procedure tentatively planned for 9/25 if BP suitable.     Thank you for this interesting consult.  I greatly enjoyed meeting Alistar Mcenery and look forward to participating in their care.  A copy of this report was sent to the requesting provider on this date.  Electronically Signed: D. Rowe Robert 05/31/2016, 1:31 PM   I spent a total of  30 minutes   in face to face in clinical consultation, greater than 50% of which was counseling/coordinating care for image guided random renal biopsy

## 2016-05-31 NOTE — Progress Notes (Signed)
[facility] Gastroenterology Progress Note  Jeremy [name] 35 y.o. 1981-02-26   Subjective: Had an episode of N/V this morning. Family in room.  Objective: Vital signs in last 24 hours: Vitals:   05/31/16 1800 05/31/16 1815  BP: (!) 181/118 (!) 186/111  Pulse: 93 92  Resp: 11 13  Temp:    T 97.7  Physical Exam: Gen: alert, no acute distress, morbidly obese HEENT: anicteric sclera CV: RRR Chest: Coarse breath sounds Abd: soft, nontender, nondistended, +BS  Lab Results:  Recent Labs  05/30/16 0546 05/31/16 0500  NA 142 144  143  K 3.6 3.6  3.6  CL 109 110  109  CO2 26 25  24   GLUCOSE 117* 130*  131*  BUN 36* 32*  32*  CREATININE 3.68* 3.56*  3.59*  CALCIUM 7.6* 8.2*  8.1*  PHOS 4.9* 4.5  4.4    Recent Labs  05/30/16 0546 05/31/16 0500  ALBUMIN 1.1* 1.2*  1.2*    Recent Labs  05/30/16 0545 05/31/16 0500  WBC 7.9 9.6  NEUTROABS 5.2 7.0  HGB 7.5* 7.9*  HCT 24.6* 26.0*  MCV 80.9 80.7  PLT 327 392   No results for input(s): LABPROT, INR in the last 72 hours.    Assessment/Plan: Anemia likely multifactorial. Ferritin within normal range and MCV normal. No signs of GI bleeding. No urgent need for GI endoscopic evaluation at this time. When patient is stable clinically can consider GI evaluation if anemia persists and no hematologic source found. Will sign off. Call us if questions.   [place] C. 05/31/2016, 6:33 PM  Pager [phone]  If no answer or after 5 PM call [phone] ID: Jeremy [name], male   DOB: February 12, 1981, 35 y.o.   MRN: [id number]

## 2016-05-31 NOTE — Progress Notes (Signed)
Advanced Heart Failure Rounding Note   Subjective:    Admitted with marked volume overload, AKI, HTN crisis, and anemia. Started on IV lasix and nitro drip. Negative 1.7 liters.   Denies SOB. Weight down. Severe HA on IV NTG at 40.  Creatinine stable at 3.6. 24 urine with nephrotic range protein (12g). Renal planning biopsy Monday.  Echo 9/22 reviewed personally. EF 55%   Objective:   Weight Range:  Vital Signs:   Temp:  [98.1 F (36.7 C)-99 F (37.2 C)] 98.1 F (36.7 C) (09/23 0800) Pulse Rate:  [84-111] 91 (09/23 1100) Resp:  [0-27] 23 (09/23 1100) BP: (138-207)/(79-138) 174/118 (09/23 1100) SpO2:  [93 %-100 %] 98 % (09/23 1100) Weight:  [156 kg (343 lb 14.4 oz)] 156 kg (343 lb 14.4 oz) (09/23 0431)    Weight change: Filed Weights   05/29/16 1604 05/30/16 0500 05/31/16 0431  Weight: (!) 159.1 kg (350 lb 11.2 oz) (!) 157.4 kg (347 lb 1.6 oz) (!) 156 kg (343 lb 14.4 oz)    Intake/Output:   Intake/Output Summary (Last 24 hours) at 05/31/16 1219 Last data filed at 05/31/16 0900  Gross per 24 hour  Intake           802.75 ml  Output             1725 ml  Net          -922.25 ml     Physical Exam: General:  Obese male  No resp difficulty. In bed HEENT: normal Neck: supple. JVP does not appear elevated.  Carotids 2+ bilat; no bruits. No lymphadenopathy or thryomegaly appreciated. LIJ Cor: PMI nondisplaced. Regular rate & rhythm. No rubs, gallops or murmurs. Lungs: clear Abdomen: obese soft, nontender, nondistended. Unable to appreciate hepatosplenomegaly. No bruits or masses. Good bowel sounds. Extremities: no cyanosis, clubbing, rash, R and LLE trace edema Neuro: alert & orientedx3, cranial nerves grossly intact. moves all 4 extremities w/o difficulty. Affect pleasant  Telemetry: NSR 90s  Labs: Basic Metabolic Panel:  Recent Labs Lab 05/29/16 0948 05/30/16 0546 05/31/16 0500  NA 143 142 144  143  K 4.0 3.6 3.6  3.6  CL 112* 109 110  109  CO2 '22 26  25  24  '$ GLUCOSE 106* 117* 130*  131*  BUN 40* 36* 32*  32*  CREATININE 3.69* 3.68* 3.56*  3.59*  CALCIUM 8.0* 7.6* 8.2*  8.1*  PHOS  --  4.9* 4.5  4.4    Liver Function Tests:  Recent Labs Lab 05/30/16 0546 05/31/16 0500  ALBUMIN 1.1* 1.2*  1.2*   No results for input(s): LIPASE, AMYLASE in the last 168 hours. No results for input(s): AMMONIA in the last 168 hours.  CBC:  Recent Labs Lab 05/29/16 0948 05/30/16 0545 05/31/16 0500  WBC 9.4 7.9 9.6  NEUTROABS  --  5.2 7.0  HGB 8.3* 7.5* 7.9*  HCT 26.9* 24.6* 26.0*  MCV 81.3 80.9 80.7  PLT 374 327 392    Cardiac Enzymes: No results for input(s): CKTOTAL, CKMB, CKMBINDEX, TROPONINI in the last 168 hours.  BNP: BNP (last 3 results)  Recent Labs  01/03/16 1550 02/27/16 1115 05/29/16 0948  BNP 20.2 17.3 56.4    ProBNP (last 3 results) No results for input(s): PROBNP in the last 8760 hours.    Other results:  Imaging: Dg Chest 2 View  Result Date: 05/30/2016 CLINICAL DATA:  Dyspnea EXAM: CHEST  2 VIEW COMPARISON:  05/29/2016 FINDINGS: Cardiomediastinal contours remain unchanged in appearance.  The cardiac silhouette is slightly possibly from low lung volumes. There is slight crowding of interstitial lung markings as a result. There are minimal airspace opacities in the lingula. There is an acute displaced left posterior fifth rib fracture without significant change from prior. There is no pneumothorax. No pleural effusion. Minimal pleural thickening versus extrapleural fat near the apices bilaterally. Left IJ central venous catheter tip is in the mid SVC. IMPRESSION: Faint pulmonary opacities in the lingula. Correlate for evolving pneumonia. Left posterior fifth rib fracture without significant callus formation. Electronically Signed   By: Ashley Royalty M.D.   On: 05/30/2016 08:22   US Renal  Result Date: 05/29/2016 CLINICAL DATA:  Acute renal insufficiency. EXAM: RENAL / URINARY TRACT ULTRASOUND COMPLETE  COMPARISON:  01/11/2015 FINDINGS: Right Kidney: Length: 15.9 cm. Mildly increased and heterogeneous parenchymal echotexture. No mass or hydronephrosis visualized. Left Kidney: Length: 16.8 cm. Mildly increased and heterogeneous parenchymal echotexture. 2.9 cm simple appearing cyst seen in midpole. No mass or hydronephrosis visualized. Bladder: Appears normal for degree of bladder distention. IMPRESSION: Bilateral renal enlargement with mildly increased and heterogeneous parenchymal echotexture, consistent with acute medical renal disease. No evidence of hydronephrosis. Electronically Signed   By: Earle Gell M.D.   On: 05/29/2016 19:35   Dg Chest Portable 1 View  Result Date: 05/29/2016 CLINICAL DATA:  Central line placement. EXAM: PORTABLE CHEST 1 VIEW COMPARISON:  11/23/2013 FINDINGS: Left IJ center venous catheter tip is in the mid distal SVC just below the carina. Low lung volumes with vascular crowding and atelectasis. Accentuation of heart size due to low lung volumes and the AP projection and portable technique. No edema or effusions. IMPRESSION: Left IJ center venous catheter in good position without complicating features. Low lung volumes with vascular crowding and streaky atelectasis. Electronically Signed   By: Marijo Sanes M.D.   On: 05/29/2016 17:45     Medications:     Scheduled Medications: . [START ON 06/01/2016] amLODipine  10 mg Oral Daily  . atorvastatin  80 mg Oral Daily  . carvedilol  25 mg Oral BID WC  . darbepoetin (ARANESP) injection - NON-DIALYSIS  100 mcg Subcutaneous Q Fri-1800  . enoxaparin (LOVENOX) injection  40 mg Subcutaneous Q24H  . ferumoxytol  510 mg Intravenous Weekly  . furosemide  80 mg Intravenous Q12H  . hydrALAZINE  100 mg Oral TID  . insulin aspart  0-5 Units Subcutaneous QHS  . insulin aspart  0-9 Units Subcutaneous TID WC  . sodium chloride flush  10-40 mL Intracatheter Q12H  . sodium chloride flush  3 mL Intravenous Q12H    Infusions: .  nitroGLYCERIN 45 mcg/min (05/31/16 0630)    PRN Medications: sodium chloride, acetaminophen, hydrALAZINE, nitroGLYCERIN, ondansetron (ZOFRAN) IV, oxyCODONE-acetaminophen, sodium chloride flush, sodium chloride flush   Assessment/Plan/Discussion   35 yo with history of nonischemic cardiomyopathy EF 50% with Grade 1 DD, HTN, suspected OSA, HLD, HTN, CKD stage III who presented to clinic with volume overload. Found to be in ARF on CKD stage III with anemia as well. Being admitted for further evaluation.   1. AKI on CKD stage III: With very low albumin, peripheral edema with low to normal CVP, and proteinuria. ESR > 140 - 24 hour urine confirms nephrotic syndrome. Discussed with Dr. Lorrene Reid (Renal) planning biopsy on Monday  - Will send ANA, SPEP, complement levels, and hepatitis serologies - still pending - Lasix, ACEI and spironolactone on hold 2. Hypertensive urgency: Headache with NTG gtt, BP still high.  - Renal  helping with HTN management - Coreg increased to 25 mg bid. - Continue hydralazine 100 mg tid - Amlodipine up to 10 mg daily.  - With normal EF will add clonidine 0.1 bid - Titrate down NTG gtt as able.  3. Chronic systolic CHF: EF as low as 25% in past, most recently was up to 50% (nonischemic CMP, thought to be related to HTN in the past).  Good co-ox67% and  CVP 4-5. - Continue to hold lasix .  - Continue Coreg, hydralazine/nitrates. - EF 55% on echo 9/22  4. Anemia: New.  Fe-deficiency anemia.  Getting IV iron   Will need GI workup.   Glori Bickers MD  Length of Stay: 2 Advanced Heart Failure Team Pager [phone] (M-F; 7a - 4p)  Please contact [facility] for night-coverage after hours (4p -7a ) and weekends on [email or web address]

## 2016-06-01 LAB — CBC WITH DIFFERENTIAL/PLATELET
BASOS PCT: 0 %
Basophils Absolute: 0 10*3/uL (ref 0.0–0.1)
EOS ABS: 0.2 10*3/uL (ref 0.0–0.7)
Eosinophils Relative: 2 %
HCT: 23.7 % — ABNORMAL LOW (ref 39.0–52.0)
HEMOGLOBIN: 7.1 g/dL — AB (ref 13.0–17.0)
LYMPHS ABS: 2.3 10*3/uL (ref 0.7–4.0)
Lymphocytes Relative: 25 %
MCH: 24.3 pg — AB (ref 26.0–34.0)
MCHC: 30 g/dL (ref 30.0–36.0)
MCV: 81.2 fL (ref 78.0–100.0)
MONO ABS: 0.8 10*3/uL (ref 0.1–1.0)
MONOS PCT: 9 %
NEUTROS PCT: 64 %
Neutro Abs: 5.8 10*3/uL (ref 1.7–7.7)
Platelets: 353 10*3/uL (ref 150–400)
RBC: 2.92 MIL/uL — ABNORMAL LOW (ref 4.22–5.81)
RDW: 15.1 % (ref 11.5–15.5)
WBC: 9.1 10*3/uL (ref 4.0–10.5)

## 2016-06-01 LAB — RENAL FUNCTION PANEL
ANION GAP: 6 (ref 5–15)
BUN: 27 mg/dL — ABNORMAL HIGH (ref 6–20)
CALCIUM: 7.7 mg/dL — AB (ref 8.9–10.3)
CO2: 26 mmol/L (ref 22–32)
Chloride: 109 mmol/L (ref 101–111)
Creatinine, Ser: 3.68 mg/dL — ABNORMAL HIGH (ref 0.61–1.24)
GFR calc non Af Amer: 20 mL/min — ABNORMAL LOW (ref >60)
GFR, EST AFRICAN AMERICAN: 23 mL/min — AB (ref >60)
Glucose, Bld: 120 mg/dL — ABNORMAL HIGH (ref 65–99)
PHOSPHORUS: 4.8 mg/dL — AB (ref 2.5–4.6)
POTASSIUM: 3.4 mmol/L — AB (ref 3.5–5.1)
SODIUM: 141 mmol/L (ref 135–145)

## 2016-06-01 LAB — CARBOXYHEMOGLOBIN
Carboxyhemoglobin: 1.9 % — ABNORMAL HIGH (ref 0.5–1.5)
Methemoglobin: 1.2 % (ref 0.0–1.5)
O2 SAT: 73.5 %
TOTAL HEMOGLOBIN: 7.1 g/dL — AB (ref 12.0–16.0)

## 2016-06-01 LAB — ABO/RH: ABO/RH(D): A POS

## 2016-06-01 LAB — GLUCOSE, CAPILLARY
GLUCOSE-CAPILLARY: 120 mg/dL — AB (ref 65–99)
GLUCOSE-CAPILLARY: 135 mg/dL — AB (ref 65–99)
Glucose-Capillary: 209 mg/dL — ABNORMAL HIGH (ref 65–99)
Glucose-Capillary: 169 mg/dL — ABNORMAL HIGH (ref 65–99)

## 2016-06-01 LAB — HEMOGLOBIN AND HEMATOCRIT, BLOOD
HCT: 24 % — ABNORMAL LOW (ref 39.0–52.0)
Hemoglobin: 7.6 g/dL — ABNORMAL LOW (ref 13.0–17.0)

## 2016-06-01 LAB — PREPARE RBC (CROSSMATCH)

## 2016-06-01 MED ORDER — SODIUM CHLORIDE 0.9 % IV SOLN
Freq: Once | INTRAVENOUS | Status: AC
  Administered 2016-06-01: 18:00:00 via INTRAVENOUS

## 2016-06-01 MED ORDER — POTASSIUM CHLORIDE CRYS ER 20 MEQ PO TBCR
40.0000 meq | EXTENDED_RELEASE_TABLET | Freq: Once | ORAL | Status: AC
  Administered 2016-06-01: 40 meq via ORAL
  Filled 2016-06-01: qty 2

## 2016-06-01 MED ORDER — CLONIDINE HCL 0.1 MG PO TABS
0.2000 mg | ORAL_TABLET | Freq: Two times a day (BID) | ORAL | Status: DC
  Administered 2016-06-01: 0.2 mg via ORAL
  Filled 2016-06-01: qty 2

## 2016-06-01 NOTE — Progress Notes (Addendum)
Advanced Heart Failure Rounding Note   Subjective:    Admitted with marked volume overload, AKI, HTN crisis, and anemia. Started on IV lasix and nitro drip. Negative 1.7 liters.   Denies SOB. Back on IV lasix. CVP 9 Creatinine stable at 3.6-3.8. 24 urine with nephrotic range protein (12g). Renal planning biopsy tomorrow.   Off IV NTG Systolics 161-096 range  Echo 9/22 reviewed personally. EF 55%   Objective:   Weight Range:  Vital Signs:   Temp:  [97.7 F (36.5 C)-98.9 F (37.2 C)] 98.4 F (36.9 C) (09/24 0818) Pulse Rate:  [74-102] 86 (09/23 2300) Resp:  [0-28] 20 (09/24 0800) BP: (122-191)/(72-130) 174/104 (09/24 0818) SpO2:  [93 %-100 %] 98 % (09/24 0300) Weight:  [151.8 kg (334 lb 9.6 oz)] 151.8 kg (334 lb 9.6 oz) (09/24 0300)    Weight change: Filed Weights   05/30/16 0500 05/31/16 0431 06/01/16 0300  Weight: (!) 157.4 kg (347 lb 1.6 oz) (!) 156 kg (343 lb 14.4 oz) (!) 151.8 kg (334 lb 9.6 oz)    Intake/Output:   Intake/Output Summary (Last 24 hours) at 06/01/16 1108 Last data filed at 06/01/16 0900  Gross per 24 hour  Intake          1708.71 ml  Output             3900 ml  Net         -2191.29 ml     Physical Exam: General:  Obese male  No resp difficulty. In bed HEENT: normal Neck: supple. JVP does not appear elevated.  Carotids 2+ bilat; no bruits. No lymphadenopathy or thryomegaly appreciated. LIJ Cor: PMI nondisplaced. Regular rate & rhythm. No rubs, gallops or murmurs. Lungs: clear Abdomen: obese soft, nontender, nondistended. Unable to appreciate hepatosplenomegaly. No bruits or masses. Good bowel sounds. Extremities: no cyanosis, clubbing, rash, R and LLE trace edema Neuro: alert & orientedx3, cranial nerves grossly intact. moves all 4 extremities w/o difficulty. Affect pleasant  Telemetry: NSR 90s  Labs: Basic Metabolic Panel:  Recent Labs Lab 05/29/16 0948 05/30/16 0546 05/31/16 0500 06/01/16 0425  NA 143 142 144  143 141  K 4.0  3.6 3.6  3.6 3.4*  CL 112* 109 110  109 109  CO2 '22 26 25  24 26  '$ GLUCOSE 106* 117* 130*  131* 120*  BUN 40* 36* 32*  32* 27*  CREATININE 3.69* 3.68* 3.56*  3.59* 3.68*  CALCIUM 8.0* 7.6* 8.2*  8.1* 7.7*  PHOS  --  4.9* 4.5  4.4 4.8*    Liver Function Tests:  Recent Labs Lab 05/30/16 0546 05/31/16 0500 06/01/16 0425  ALBUMIN 1.1* 1.2*  1.2* <1.0*   No results for input(s): LIPASE, AMYLASE in the last 168 hours. No results for input(s): AMMONIA in the last 168 hours.  CBC:  Recent Labs Lab 05/29/16 0948 05/30/16 0545 05/31/16 0500 06/01/16 0425  WBC 9.4 7.9 9.6 9.1  NEUTROABS  --  5.2 7.0 5.8  HGB 8.3* 7.5* 7.9* 7.1*  HCT 26.9* 24.6* 26.0* 23.7*  MCV 81.3 80.9 80.7 81.2  PLT 374 327 392 353    Cardiac Enzymes: No results for input(s): CKTOTAL, CKMB, CKMBINDEX, TROPONINI in the last 168 hours.  BNP: BNP (last 3 results)  Recent Labs  01/03/16 1550 02/27/16 1115 05/29/16 0948  BNP 20.2 17.3 56.4    ProBNP (last 3 results) No results for input(s): PROBNP in the last 8760 hours.    Other results:  Imaging: No results found.  Medications:     Scheduled Medications: . amLODipine  10 mg Oral Daily  . atorvastatin  80 mg Oral Daily  . carvedilol  25 mg Oral BID WC  . cloNIDine  0.1 mg Oral BID  . darbepoetin (ARANESP) injection - NON-DIALYSIS  100 mcg Subcutaneous Q Fri-1800  . ferumoxytol  510 mg Intravenous Weekly  . furosemide  80 mg Intravenous Q12H  . hydrALAZINE  100 mg Oral TID  . insulin aspart  0-5 Units Subcutaneous QHS  . insulin aspart  0-9 Units Subcutaneous TID WC  . potassium chloride  40 mEq Oral Once  . sodium chloride flush  10-40 mL Intracatheter Q12H  . sodium chloride flush  3 mL Intravenous Q12H    Infusions: . nitroGLYCERIN Stopped (06/01/16 0415)    PRN Medications: sodium chloride, acetaminophen, hydrALAZINE, nitroGLYCERIN, ondansetron (ZOFRAN) IV, oxyCODONE-acetaminophen, sodium chloride flush, sodium  chloride flush, sorbitol   Assessment/Plan/Discussion   35 yo with history of nonischemic cardiomyopathy EF 50% with Grade 1 DD, HTN, suspected OSA, HLD, HTN, CKD stage III who presented to clinic with volume overload. Found to be in ARF on CKD stage III with anemia as well. Being admitted for further evaluation.   1. AKI on CKD stage III: With very low albumin, peripheral edema with low to normal CVP, and proteinuria. ESR > 140 - 24 hour urine confirms nephrotic syndrome. Discussed with Dr. Lorrene Reid (Renal) planning biopsy on Monday  - Will send ANA, SPEP, complement levels, and hepatitis serologies - still pending - Renal restarted lasix 2. Hypertensive urgency:  --BP still high on carvedilol 25, hydralazine 100 mg tid, amlodipine 10 mg daily.  - With normal EF clonidine 0.1 bid added yesterday. Will increase to 0.2 tid - Titrate down NTG gtt as able.  3. Chronic systolic CHF: EF as low as 25% in past, most recently was up to 50% (nonischemic CMP, thought to be related to HTN in the past).  Good co-ox67% and  CVP 9 - Renal restarted lasix - Continue Coreg, hydralazine/nitrates. - EF 55% on echo 9/22  4. Anemia: New.  Fe-deficiency anemia.  Has received IV iron   GI following. No urgent need for endoscopy. Will follow as outpatient.  5. Hypokalemia -supp K+  Tiphanie Vo MD  Length of Stay: 3 Advanced Heart Failure Team Pager [phone] (M-F; 7a - 4p)  Please contact [facility] for night-coverage after hours (4p -7a ) and weekends on [email or web address]

## 2016-06-01 NOTE — Progress Notes (Signed)
Titrated Ntg gtt off pts sbp below 160.  Will continue to monitor Jeremy [name] T

## 2016-06-01 NOTE — Progress Notes (Addendum)
[facility] Rounding Note  Subjective/Interval History:   No new complaints Excellent diuretic response to IV lasix Renal function stable  Objective Vital signs in last 24 hours: Vitals:   06/01/16 0400 06/01/16 0800 06/01/16 0818 06/01/16 1100  BP: (!) 153/108 (!) 167/109 (!) 174/104 (!) 152/94  Pulse:      Resp: (!) 27 20  (!) 22  Temp:   98.4 F (36.9 C)   TempSrc:   Oral   SpO2:      Weight:      Height:       Weight trending 06/01/16 0300  151.8 kg (334 lb 9.6 oz)   05/31/16 0431  156 kg (343 lb 14.4 oz)   05/30/16 0500  157.4 kg (347 lb 1.6 oz)   05/29/16 1604  159.1 kg (350 lb 11.2 oz)     Intake/Output Summary (Last 24 hours) at 06/01/16 1158 Last data filed at 06/01/16 0900  Gross per 24 hour  Intake          1708.71 ml  Output             3900 ml  Net         -2191.29 ml   Physical Exam:  Blood pressure (!) 152/94, pulse 86, temperature 98.4 F (36.9 C), temperature source Oral, resp. rate (!) 22, height 6\' 5"  (1.956 m), weight (!) 151.8 kg (334 lb 9.6 oz), SpO2 98 %.  CVP 9 Very mice MO AAM NAD Cannot see his neck veins at all Lungs clear S1S2 No S3 Obese abdomen with some pitting edema of the abd wall 1-2+ LE pitting edema   Recent Labs Lab 05/29/16 0948 05/30/16 0546 05/31/16 0500 06/01/16 0425  NA 143 142 144  143 141  K 4.0 3.6 3.6  3.6 3.4*  CL 112* 109 110  109 109  CO2 22 26 25  24 26   GLUCOSE 106* 117* 130*  131* 120*  BUN 40* 36* 32*  32* 27*  CREATININE 3.69* 3.68* 3.56*  3.59* 3.68*  CALCIUM 8.0* 7.6* 8.2*  8.1* 7.7*  PHOS  --  4.9* 4.5  4.4 4.8*     Recent Labs Lab 05/30/16 0546 05/31/16 0500 06/01/16 0425  ALBUMIN 1.1* 1.2*  1.2* <1.0*     Recent Labs Lab 05/29/16 0948 05/30/16 0545 05/31/16 0500 06/01/16 0425  WBC 9.4 7.9 9.6 9.1  NEUTROABS  --  5.2 7.0 5.8  HGB 8.3* 7.5* 7.9* 7.1*  HCT 26.9* 24.6* 26.0* 23.7*  MCV 81.3 80.9 80.7 81.2  PLT 374 327 392 353     Recent Labs Lab 05/31/16 0729  05/31/16 1212 05/31/16 1646 05/31/16 2120 06/01/16 0755  GLUCAP 151* 151* 149* 131* 120*    Iron/TIBC/Ferritin/ %Sat    Component Value Date/Time   IRON 30 (L) 05/29/2016 1630   TIBC 116 (L) 05/29/2016 1630   FERRITIN 80 01/09/2015 1634   IRONPCTSAT 26 05/29/2016 1630    Results for LADARRYL, WRAGE (MRN [id number]) as of 05/30/2016 09:26  11/09/2013 09:01 12/31/2014 17:22 05/29/2016 19:06  Appearance CLOUDY (A) CLOUDY (A) CLEAR  Bacteria, UA RARE RARE RARE (A)  Bilirubin Urine NEGATIVE NEGATIVE NEGATIVE  Casts  HYALINE CASTS (A) HYALINE CASTS (A)  Color, Urine YELLOW YELLOW YELLOW  Glucose 100 (A) 250 (A) 100 (A)  Hgb urine dipstick SMALL (A) LARGE (A) SMALL (A)  Ketones, ur NEGATIVE NEGATIVE NEGATIVE  Leukocytes, UA NEGATIVE NEGATIVE NEGATIVE  Nitrite NEGATIVE NEGATIVE NEGATIVE  pH 5.5 5.5 7.0  Protein NEGATIVE >300 (  A) >300 (A)  RBC / HPF 3-6 7-10 6-30  Specific Gravity, Urine 1.027 1.030 1.018  Squamous Epithelial / LPF RARE RARE 0-5 (A)  Urine-Other   MUCOUS PRESENT  Urobilinogen, UA 0.2 0.2   WBC, UA  0-2 0-5    Results for EZIO, WIECK (MRN [id number]) as of 05/31/2016 09:54  Ref. Range 05/30/2016 11:57  C3 Complement Latest Ref Range: 82 - 167 mg/dL 139  Compl, Total (CH50) Latest Ref Range: 42 - 60 U/mL 58  Complement C4, Body Fluid Latest Ref Range: 14 - 44 mg/dL 31   Results for AENGUS, SAUCEDA (MRN [id number]) as of 05/31/2016 09:54  Ref. Range 05/30/2016 11:57 05/30/2016 12:46  Hepatitis B Surface Ag Latest Ref Range: Negative   Negative  HCV Ab Latest Ref Range: 0.0 - 0.9 s/co ratio <0.1    Results for DONTAYE, HUR (MRN [id number]) as of 05/31/2016 09:54  05/30/2016 12:29  Protein Creatinine Ratio 12.96 (H)   Results for HUSTON, STONEHOCKER (MRN [id number]) as of 06/01/2016 11:49  Ref. Range 05/30/2016 11:00  ANCA Proteinase 3 Latest Ref Range: 0.0 - 3.5 U/mL <3.5  ds DNA Ab Latest Ref Range: 0 - 9 IU/mL <1  Myeloperoxidase Abs Latest Ref Range: 0.0 - 9.0  U/mL <9.0    anti GBM, serum free light chains, urine immunofixation, SPEP all pending  Studies/Results: Dg Chest 2 View  Result Date: 05/30/2016 CLINICAL DATA:  Dyspnea EXAM: CHEST  2 VIEW COMPARISON:  05/29/2016 FINDINGS: Cardiomediastinal contours remain unchanged in appearance. The cardiac silhouette is slightly possibly from low lung volumes. There is slight crowding of interstitial lung markings as a result. There are minimal airspace opacities in the lingula. There is an acute displaced left posterior fifth rib fracture without significant change from prior. There is no pneumothorax. No pleural effusion. Minimal pleural thickening versus extrapleural fat near the apices bilaterally. Left IJ central venous catheter tip is in the mid SVC. IMPRESSION: Faint pulmonary opacities in the lingula. Correlate for evolving pneumonia. Left posterior fifth rib fracture without significant callus formation. Electronically Signed   By: Ashley Royalty M.D.   On: 05/30/2016 08:22   US Renal  Result Date: 05/29/2016 CLINICAL DATA:  Acute renal insufficiency. EXAM: RENAL / URINARY TRACT ULTRASOUND COMPLETE COMPARISON:  01/11/2015 FINDINGS: Right Kidney: Length: 15.9 cm. Mildly increased and heterogeneous parenchymal echotexture. No mass or hydronephrosis visualized. Left Kidney: Length: 16.8 cm. Mildly increased and heterogeneous parenchymal echotexture. 2.9 cm simple appearing cyst seen in midpole. No mass or hydronephrosis visualized. Bladder: Appears normal for degree of bladder distention. IMPRESSION: Bilateral renal enlargement with mildly increased and heterogeneous parenchymal echotexture, consistent with acute medical renal disease. No evidence of hydronephrosis. Electronically Signed   By: Earle Gell M.D.   On: 05/29/2016 19:35   Dg Chest Portable 1 View  Result Date: 05/29/2016 CLINICAL DATA:  Central line placement. EXAM: PORTABLE CHEST 1 VIEW COMPARISON:  11/23/2013 FINDINGS: Left IJ center venous  catheter tip is in the mid distal SVC just below the carina. Low lung volumes with vascular crowding and atelectasis. Accentuation of heart size due to low lung volumes and the AP projection and portable technique. No edema or effusions. IMPRESSION: Left IJ center venous catheter in good position without complicating features. Low lung volumes with vascular crowding and streaky atelectasis. Electronically Signed   By: Marijo Sanes M.D.   On: 05/29/2016 17:45    ECHO - EF 55%, LAE, LVdil, Gr 1 DD  Medications: . nitroGLYCERIN Stopped (06/01/16 0415)   .  amLODipine  10 mg Oral Daily  . atorvastatin  80 mg Oral Daily  . carvedilol  25 mg Oral BID WC  . cloNIDine  0.1 mg Oral BID  . darbepoetin (ARANESP) injection - NON-DIALYSIS  100 mcg Subcutaneous Q Fri-1800  . ferumoxytol  510 mg Intravenous Weekly  . furosemide  80 mg Intravenous Q12H  . hydrALAZINE  100 mg Oral TID  . insulin aspart  0-5 Units Subcutaneous QHS  . insulin aspart  0-9 Units Subcutaneous TID WC  . potassium chloride  40 mEq Oral Once  . sodium chloride flush  10-40 mL Intracatheter Q12H  . sodium chloride flush  3 mL Intravenous Q12H    Background:   35 year old black male with DM, HTN and cardiomyopathy who presents with AKI on CKD in the setting of volume overload and malignant HTN. Baseline creatinine 1.5-1.6 during 2017, up to 3.89 on admission with marked hypoalbuminemia, microhematuria since 2016.  We have been asked to see because renal insufficiency  Assessment/Recommendations:  1. AKI on CKD3 - clear progression of disease past 2 years, much more rapid past 3 months. He has very low alb (1.1, and was 1.4 in 2016), large kidneys on [place], has had proteinuria on UA only since 12/2014 (no proteinuria 2015) and his microhematuria seems more significant on most recent UA's.  UPC confirms nephrotic range proteinuria at just over 12 grams. DsDNA, ANCA, Hep serologies all neg. 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  Possiblities include FSGS (idiopathic or obesity related), membranous, amyloid, DM seems less likely (given no proteinuria until 2016), vs other.    1. Pending tests include SPEP, UPEP, ANA, serum FLC's.   2. Hep B and C, dsDNA, ANCA and complements are all normal.    3. Plan diagnostic renal biopsy for Monday 4. Holding ASA and lovenox 5. Path form in paper chart  2.  Volume overload/anasarca  1. Excellent diuresis with lasix 80 IV Q12H with stable renal function  3.   Anemia - normal Hb last year. No blood loss reported. Total iron low (tsat normal because TIBC so low) .  1. Feraheme X 2 doses 2. Aranesp 100 mcg/week for now  3. wh Hb of 7.1, need better cushion for renal bx - transfuse 1 unit PRBC's today  4.   DM  1. metformin on hold.  1. SSI for now  5.  HTN - carvedilol/hydralazine/amlodipine/clonidine.  1. BP not controlle   Jamal Maes, MD [facility] [phone] pager 06/01/2016, 11:51 AM

## 2016-06-02 ENCOUNTER — Inpatient Hospital Stay (HOSPITAL_COMMUNITY): Payer: Self-pay

## 2016-06-02 LAB — FOLATE RBC
Folate, Hemolysate: 307.1 ng/mL
Folate, RBC: 1296 ng/mL (ref >498)
Hematocrit: 23.7 % — ABNORMAL LOW (ref 37.5–51.0)

## 2016-06-02 LAB — RENAL FUNCTION PANEL
ALBUMIN: 1.1 g/dL — AB (ref 3.5–5.0)
ANION GAP: 8 (ref 5–15)
BUN: 34 mg/dL — ABNORMAL HIGH (ref 6–20)
CALCIUM: 7.4 mg/dL — AB (ref 8.9–10.3)
CO2: 23 mmol/L (ref 22–32)
Chloride: 108 mmol/L (ref 101–111)
Creatinine, Ser: 3.48 mg/dL — ABNORMAL HIGH (ref 0.61–1.24)
GFR calc non Af Amer: 21 mL/min — ABNORMAL LOW (ref >60)
GFR, EST AFRICAN AMERICAN: 24 mL/min — AB (ref >60)
GLUCOSE: 125 mg/dL — AB (ref 65–99)
PHOSPHORUS: 4.2 mg/dL (ref 2.5–4.6)
Potassium: 4.5 mmol/L (ref 3.5–5.1)
SODIUM: 139 mmol/L (ref 135–145)

## 2016-06-02 LAB — TYPE AND SCREEN
ABO/RH(D): A POS
ANTIBODY SCREEN: NEGATIVE
UNIT DIVISION: 0
Unit division: 0

## 2016-06-02 LAB — CBC WITH DIFFERENTIAL/PLATELET
BASOS ABS: 0 10*3/uL (ref 0.0–0.1)
BASOS PCT: 0 %
EOS ABS: 0.2 10*3/uL (ref 0.0–0.7)
EOS PCT: 2 %
HEMATOCRIT: 25.3 % — AB (ref 39.0–52.0)
HEMOGLOBIN: 7.7 g/dL — AB (ref 13.0–17.0)
Lymphocytes Relative: 25 %
Lymphs Abs: 2.4 10*3/uL (ref 0.7–4.0)
MCH: 25 pg — AB (ref 26.0–34.0)
MCHC: 30.4 g/dL (ref 30.0–36.0)
MCV: 82.1 fL (ref 78.0–100.0)
MONO ABS: 0.9 10*3/uL (ref 0.1–1.0)
Monocytes Relative: 9 %
Neutro Abs: 6.1 10*3/uL (ref 1.7–7.7)
Neutrophils Relative %: 64 %
Platelets: 337 10*3/uL (ref 150–400)
RBC: 3.08 MIL/uL — ABNORMAL LOW (ref 4.22–5.81)
RDW: 15.3 % (ref 11.5–15.5)
WBC: 9.6 10*3/uL (ref 4.0–10.5)

## 2016-06-02 LAB — GLUCOSE, CAPILLARY
GLUCOSE-CAPILLARY: 139 mg/dL — AB (ref 65–99)
GLUCOSE-CAPILLARY: 153 mg/dL — AB (ref 65–99)
Glucose-Capillary: 266 mg/dL — ABNORMAL HIGH (ref 65–99)
Glucose-Capillary: 171 mg/dL — ABNORMAL HIGH (ref 65–99)

## 2016-06-02 LAB — PROTEIN ELECTROPHORESIS, SERUM
A/G RATIO SPE: 0.4 — AB (ref 0.7–1.7)
ALBUMIN ELP: 1.3 g/dL — AB (ref 2.9–4.4)
ALPHA-1-GLOBULIN: 0.2 g/dL (ref 0.0–0.4)
ALPHA-2-GLOBULIN: 1.5 g/dL — AB (ref 0.4–1.0)
BETA GLOBULIN: 0.9 g/dL (ref 0.7–1.3)
GLOBULIN, TOTAL: 3.2 g/dL (ref 2.2–3.9)
Gamma Globulin: 0.7 g/dL (ref 0.4–1.8)
Total Protein ELP: 4.5 g/dL — ABNORMAL LOW (ref 6.0–8.5)

## 2016-06-02 LAB — CARBOXYHEMOGLOBIN
Carboxyhemoglobin: 1.4 % (ref 0.5–1.5)
Methemoglobin: 2.6 % — ABNORMAL HIGH (ref 0.0–1.5)
O2 SAT: 84.4 %
Total hemoglobin: 7.2 g/dL — ABNORMAL LOW (ref 12.0–16.0)

## 2016-06-02 LAB — GLOMERULAR BASEMENT MEMBRANE ANTIBODIES: GBM Ab: 4 units (ref 0–20)

## 2016-06-02 LAB — PROTIME-INR
INR: 1.01
Prothrombin Time: 13.4 seconds (ref 11.4–15.2)

## 2016-06-02 LAB — ANTINUCLEAR ANTIBODIES, IFA: ANA Ab, IFA: NEGATIVE

## 2016-06-02 LAB — KAPPA/LAMBDA LIGHT CHAINS
KAPPA FREE LGHT CHN: 141.2 mg/L — AB (ref 3.3–19.4)
KAPPA, LAMDA LIGHT CHAIN RATIO: 2.37 — AB (ref 0.26–1.65)
LAMDA FREE LIGHT CHAINS: 59.6 mg/L — AB (ref 5.7–26.3)

## 2016-06-02 MED ORDER — MIDAZOLAM HCL 2 MG/2ML IJ SOLN
INTRAMUSCULAR | Status: AC | PRN
  Administered 2016-06-02: 0.5 mg via INTRAVENOUS
  Administered 2016-06-02: 1 mg via INTRAVENOUS

## 2016-06-02 MED ORDER — FENTANYL CITRATE (PF) 100 MCG/2ML IJ SOLN
INTRAMUSCULAR | Status: AC
  Filled 2016-06-02: qty 2

## 2016-06-02 MED ORDER — FUROSEMIDE 80 MG PO TABS
160.0000 mg | ORAL_TABLET | Freq: Two times a day (BID) | ORAL | Status: DC
  Administered 2016-06-02 – 2016-06-03 (×3): 160 mg via ORAL
  Filled 2016-06-02 (×3): qty 2

## 2016-06-02 MED ORDER — FENTANYL CITRATE (PF) 100 MCG/2ML IJ SOLN
INTRAMUSCULAR | Status: AC | PRN
  Administered 2016-06-02 (×2): 25 ug via INTRAVENOUS

## 2016-06-02 MED ORDER — CLONIDINE HCL 0.1 MG PO TABS
0.3000 mg | ORAL_TABLET | Freq: Two times a day (BID) | ORAL | Status: DC
  Administered 2016-06-02 (×2): 0.3 mg via ORAL
  Filled 2016-06-02 (×2): qty 3

## 2016-06-02 MED ORDER — MIDAZOLAM HCL 2 MG/2ML IJ SOLN
INTRAMUSCULAR | Status: AC
  Filled 2016-06-02: qty 2

## 2016-06-02 MED ORDER — LIDOCAINE HCL 1 % IJ SOLN
INTRAMUSCULAR | Status: AC
  Filled 2016-06-02: qty 20

## 2016-06-02 MED ORDER — AMLODIPINE BESYLATE 10 MG PO TABS
10.0000 mg | ORAL_TABLET | Freq: Every day | ORAL | Status: DC
  Administered 2016-06-02: 10 mg via ORAL
  Filled 2016-06-02: qty 1

## 2016-06-02 NOTE — Progress Notes (Signed)
Subjective: Interval History: has no complaint , would like to go home.  Objective: Vital signs in last 24 hours: Temp:  [97.6 F (36.4 C)-98.7 F (37.1 C)] (P) 98.4 F (36.9 C) (09/25 0743) Pulse Rate:  [83-86] 86 (09/25 0330) Resp:  [12-27] 20 (09/25 0330) BP: (124-174)/(79-112) 155/89 (09/25 0330) SpO2:  [97 %-98 %] 98 % (09/25 0330) Weight:  [153.4 kg (338 lb 1.6 oz)] 153.4 kg (338 lb 1.6 oz) (09/25 0330) Weight change: 1.588 kg (3 lb 8 oz)  Intake/Output from previous day: 09/24 0701 - 09/25 0700 In: 1045 [P.O.:710; Blood:335] Out: 3150 [Urine:3150] Intake/Output this shift: No intake/output data recorded.  General appearance: alert, cooperative, no distress and moderately obese Resp: clear to auscultation bilaterally Cardio: S1, S2 normal and systolic murmur: holosystolic 2/6, blowing at apex GI: obese, pos bs, soft, liver down 5 cm, striae Extremities: edema 1-2+  Lab Results:  Recent Labs  06/01/16 0425 06/01/16 2254 06/02/16 0332  WBC 9.1  --  9.6  HGB 7.1* 7.6* 7.7*  HCT 23.7* 24.0* 25.3*  PLT 353  --  337   BMET:  Recent Labs  06/01/16 0425 06/02/16 0500  NA 141 139  K 3.4* 4.5  CL 109 108  CO2 26 23  GLUCOSE 120* 125*  BUN 27* 34*  CREATININE 3.68* 3.48*  CALCIUM 7.7* 7.4*   No results for input(s): PTH in the last 72 hours. Iron Studies:  Recent Labs  05/31/16 0500  FERRITIN 213    Studies/Results: No results found.  I have reviewed the patient's current medications.  Assessment/Plan: 1 Proteinuria, renal insuffic  ? ATN with proteinuria vs effect of bad high bp.  Can use po diuretics now. For Bx.   2 Renal insuffic 3 Anemia  Out or proportion to illness vs longer standing 4 DM  Controlled 5 HTN will come down with lower vol 6 Obesity 7 HPTH no value P Bx, po lasix, cont other meds.      LOS: 4 days   Jeremy [name] L 06/02/2016,7:46 AM

## 2016-06-02 NOTE — Sedation Documentation (Signed)
Patient is resting comfortably. No complaints at this time. MD at bedside

## 2016-06-02 NOTE — Progress Notes (Signed)
Pt hr 140 briefly then back to 90's. Pt asymptomatic . BP 142/74 sats 96 % on bipap.   Will continue to monitor Saunders Revel T

## 2016-06-02 NOTE — Progress Notes (Signed)
Advanced Heart Failure Rounding Note   Subjective:    Admitted with marked volume overload, AKI, HTN crisis, and anemia. Started on IV lasix and nitro drip.  24 urine with nephrotic range protein (12g).  CVP 8-9. Creatinine remains elevated but stable to mild downtrend at 3.48   Feeling "much better."  Breathing has improved.  Wants to know how long he will be in hospital.   Renal planning biopsy today.  SBP remains 150-170 range  Echo 9/22 with LVEF 55%. Negative 6.6 L and down 12 lbs so far.   Objective:   Weight Range:  Vital Signs:   Temp:  [97.6 F (36.4 C)-98.7 F (37.1 C)] 98.3 F (36.8 C) (09/25 0330) Pulse Rate:  [83-86] 86 (09/25 0330) Resp:  [12-27] 20 (09/25 0330) BP: (124-174)/(79-112) 155/89 (09/25 0330) SpO2:  [97 %-98 %] 98 % (09/25 0330) Weight:  [338 lb 1.6 oz (153.4 kg)] 338 lb 1.6 oz (153.4 kg) (09/25 0330)    Weight change: Filed Weights   05/31/16 0431 06/01/16 0300 06/02/16 0330  Weight: (!) 343 lb 14.4 oz (156 kg) (!) 334 lb 9.6 oz (151.8 kg) (!) 338 lb 1.6 oz (153.4 kg)    Intake/Output:   Intake/Output Summary (Last 24 hours) at 06/02/16 0724 Last data filed at 06/02/16 0330  Gross per 24 hour  Intake             1045 ml  Output             3150 ml  Net            -2105 ml     Physical Exam: CVP 8-9 General:  Obese male  No resp difficulty. In bed HEENT: Normal Neck: supple. JVP difficult to assess due to body habitus.  Carotids 2+ bilat; no bruits. No thyromegaly or nodule noted.  Cor: PMI nondisplaced. RRR. No M/G/R appreciated Lungs: CTAB, normal effrt Abdomen: obese soft, NT, ND, no HSM. No bruits or masses. +BS  Extremities: no cyanosis, clubbing, rash. 1+ ankle edema.  Neuro: alert & orientedx3, cranial nerves grossly intact. moves all 4 extremities w/o difficulty. Affect pleasant  Telemetry: NSR 90s  Labs: Basic Metabolic Panel:  Recent Labs Lab 05/29/16 0948 05/30/16 0546 05/31/16 0500 06/01/16 0425  06/02/16 0500  NA 143 142 144  143 141 139  K 4.0 3.6 3.6  3.6 3.4* 4.5  CL 112* 109 110  109 109 108  CO2 _0 GLUCOSE 106* 117* 130*  131* 120* 125*  BUN 40* 36* 32*  32* 27* 34*  CREATININE 3.69* 3.68* 3.56*  3.59* 3.68* 3.48*  CALCIUM 8.0* 7.6* 8.2*  8.1* 7.7* 7.4*  PHOS  --  4.9* 4.5  4.4 4.8* 4.2    Liver Function Tests:  Recent Labs Lab 05/30/16 0546 05/31/16 0500 06/01/16 0425 06/02/16 0500  ALBUMIN 1.1* 1.2*  1.2* <1.0* 1.1*   No results for input(s): LIPASE, AMYLASE in the last 168 hours. No results for input(s): AMMONIA in the last 168 hours.  CBC:  Recent Labs Lab 05/29/16 0948 05/30/16 0545 05/31/16 0500 06/01/16 0425 06/01/16 2254 06/02/16 0332  WBC 9.4 7.9 9.6 9.1  --  9.6  NEUTROABS  --  5.2 7.0 5.8  --  6.1  HGB 8.3* 7.5* 7.9* 7.1* 7.6* 7.7*  HCT 26.9* 24.6* 26.0* 23.7* 24.0* 25.3*  MCV 81.3 80.9 80.7 81.2  --  82.1  PLT 374 327 392 353  --  337  Cardiac Enzymes: No results for input(s): CKTOTAL, CKMB, CKMBINDEX, TROPONINI in the last 168 hours.  BNP: BNP (last 3 results)  Recent Labs  01/03/16 1550 02/27/16 1115 05/29/16 0948  BNP 20.2 17.3 56.4    ProBNP (last 3 results) No results for input(s): PROBNP in the last 8760 hours.    Other results:  Imaging: No results found.   Medications:     Scheduled Medications: . amLODipine  10 mg Oral QHS  . atorvastatin  80 mg Oral Daily  . carvedilol  25 mg Oral BID WC  . cloNIDine  0.2 mg Oral BID  . darbepoetin (ARANESP) injection - NON-DIALYSIS  100 mcg Subcutaneous Q Fri-1800  . ferumoxytol  510 mg Intravenous Weekly  . furosemide  160 mg Oral BID  . hydrALAZINE  100 mg Oral TID  . insulin aspart  0-5 Units Subcutaneous QHS  . insulin aspart  0-9 Units Subcutaneous TID WC  . sodium chloride flush  10-40 mL Intracatheter Q12H  . sodium chloride flush  3 mL Intravenous Q12H    Infusions: . nitroGLYCERIN Stopped (06/01/16 0415)    PRN  Medications: sodium chloride, acetaminophen, hydrALAZINE, nitroGLYCERIN, ondansetron (ZOFRAN) IV, oxyCODONE-acetaminophen, sodium chloride flush, sodium chloride flush, sorbitol   Assessment/Plan/Discussion   35 yo with history of nonischemic cardiomyopathy EF 50% with Grade 1 DD, HTN, suspected OSA, HLD, HTN, CKD stage III who presented to clinic with volume overload. Found to be in ARF on CKD stage III with anemia as well. .   1. AKI on CKD stage III: With very low albumin, peripheral edema with low to normal CVP, and proteinuria. ESR > 140 - 24 hour urine confirms nephrotic syndrome (12 g/day). Discussed with Dr. Lorrene Reid (Renal) planning biopsy on Monday - Hepatitis serologies, complement, dsDNA, ANCA negative.  Pending SPEP, ANA.  - Now on 160 mg po lasix BID per renal.  2. Hypertensive urgency:  - BP still high on carvedilol 25, hydralazine 100 mg tid, amlodipine 10 mg daily.  - Increase clonidine 0.3 bid with normal EF.  - Now off Nitro gtt. Had severe headaches with nitro gtt.  3. Chronic diastolic CHF: EF as low as 25% in past, 55% this admission (nonischemic CMP, thought to be related to HTN in the past).  Good co-ox 67% and  CVP 9 - on po lasix as above.  4. Anemia: New.  Fe-deficiency anemia.  Has received IV iron   GI following. No urgent need for endoscopy. Will follow as outpatient.  5. Hypokalemia -Stable today at K+ of 4.5.  Shirley Friar, PA-C 06/02/16   Length of Stay: 4 Advanced Heart Failure Team Pager [phone] (M-F; 7a - 4p)  Please contact [facility] for night-coverage after hours (4p -7a ) and weekends on [email or web address]  Patient seen with PA, agree with the above note.  Admitted with nephrotic syndrome and anasarca with elevated creatinine.  Creatinine and volume status stabilized.  Pending renal biopsy today.  Continuing to adjust BP-active meds.   Loralie Champagne 06/02/2016 7:46 AM

## 2016-06-02 NOTE — Sedation Documentation (Signed)
Vital signs stable. 

## 2016-06-02 NOTE — Procedures (Signed)
Interventional Radiology Procedure Note  Procedure: US guided core biopsy RIGHT lower pole renal cortex.  19R x2  Complications: None  Estimated Blood Loss: 0  Recommendations: - Bedrest x 4 hrs - Path pending - BP control  Signed,  Criselda Peaches, MD

## 2016-06-02 NOTE — Sedation Documentation (Signed)
Patient is resting comfortably. 

## 2016-06-03 LAB — RENAL FUNCTION PANEL
ANION GAP: 4 — AB (ref 5–15)
Albumin: 1 g/dL — ABNORMAL LOW (ref 3.5–5.0)
BUN: 33 mg/dL — AB (ref 6–20)
CHLORIDE: 110 mmol/L (ref 101–111)
CO2: 25 mmol/L (ref 22–32)
Calcium: 7.6 mg/dL — ABNORMAL LOW (ref 8.9–10.3)
Creatinine, Ser: 3.42 mg/dL — ABNORMAL HIGH (ref 0.61–1.24)
GFR calc Af Amer: 25 mL/min — ABNORMAL LOW (ref >60)
GFR, EST NON AFRICAN AMERICAN: 22 mL/min — AB (ref >60)
GLUCOSE: 131 mg/dL — AB (ref 65–99)
POTASSIUM: 4.6 mmol/L (ref 3.5–5.1)
Phosphorus: 4.7 mg/dL — ABNORMAL HIGH (ref 2.5–4.6)
Sodium: 139 mmol/L (ref 135–145)

## 2016-06-03 LAB — IMMUNOFIXATION, URINE

## 2016-06-03 LAB — CARBOXYHEMOGLOBIN
CARBOXYHEMOGLOBIN: 0.6 % (ref 0.5–1.5)
Methemoglobin: 0.9 % (ref 0.0–1.5)
O2 SAT: 56.2 %
Total hemoglobin: 15 g/dL (ref 12.0–16.0)

## 2016-06-03 LAB — CBC WITH DIFFERENTIAL/PLATELET
BASOS ABS: 0 10*3/uL (ref 0.0–0.1)
BASOS PCT: 0 %
EOS PCT: 2 %
Eosinophils Absolute: 0.1 10*3/uL (ref 0.0–0.7)
HCT: 25.7 % — ABNORMAL LOW (ref 39.0–52.0)
Hemoglobin: 8 g/dL — ABNORMAL LOW (ref 13.0–17.0)
Lymphocytes Relative: 26 %
Lymphs Abs: 2.2 10*3/uL (ref 0.7–4.0)
MCH: 25.5 pg — ABNORMAL LOW (ref 26.0–34.0)
MCHC: 31.1 g/dL (ref 30.0–36.0)
MCV: 81.8 fL (ref 78.0–100.0)
MONO ABS: 0.7 10*3/uL (ref 0.1–1.0)
Monocytes Relative: 9 %
Neutro Abs: 5.2 10*3/uL (ref 1.7–7.7)
Neutrophils Relative %: 63 %
PLATELETS: 253 10*3/uL (ref 150–400)
RBC: 3.14 MIL/uL — ABNORMAL LOW (ref 4.22–5.81)
RDW: 15.6 % — AB (ref 11.5–15.5)
WBC: 8.3 10*3/uL (ref 4.0–10.5)

## 2016-06-03 LAB — GLUCOSE, CAPILLARY
GLUCOSE-CAPILLARY: 207 mg/dL — AB (ref 65–99)
Glucose-Capillary: 153 mg/dL — ABNORMAL HIGH (ref 65–99)

## 2016-06-03 MED ORDER — FUROSEMIDE 80 MG PO TABS: 160.0000 mg | ORAL_TABLET | Freq: Two times a day (BID) | ORAL | 6 refills | Status: DC

## 2016-06-03 MED ORDER — HYDRALAZINE HCL 100 MG PO TABS: 100.0000 mg | ORAL_TABLET | Freq: Two times a day (BID) | ORAL | 6 refills | Status: DC

## 2016-06-03 MED ORDER — CLONIDINE HCL 0.1 MG PO TABS
0.2000 mg | ORAL_TABLET | Freq: Two times a day (BID) | ORAL | Status: DC
  Administered 2016-06-03: 0.2 mg via ORAL
  Filled 2016-06-03: qty 2

## 2016-06-03 MED ORDER — CLONIDINE HCL 0.2 MG PO TABS: 0.2000 mg | ORAL_TABLET | Freq: Two times a day (BID) | ORAL | 6 refills | Status: DC

## 2016-06-03 MED ORDER — CARVEDILOL 25 MG PO TABS: 25.0000 mg | ORAL_TABLET | Freq: Two times a day (BID) | ORAL | 6 refills | Status: DC

## 2016-06-03 MED ORDER — AMLODIPINE BESYLATE 10 MG PO TABS: 10.0000 mg | ORAL_TABLET | Freq: Every day | ORAL | 6 refills | Status: DC

## 2016-06-03 MED ORDER — HYDRALAZINE HCL 50 MG PO TABS
100.0000 mg | ORAL_TABLET | Freq: Two times a day (BID) | ORAL | Status: DC
  Administered 2016-06-03: 100 mg via ORAL
  Filled 2016-06-03: qty 2

## 2016-06-03 MED FILL — AMLODIPINE BESYLATE 10 MG T: 10 | 30 days supply | Qty: 30 | Fill #0

## 2016-06-03 MED FILL — CARVEDILOL 25 MG TABLET: 25 | 30 days supply | Qty: 60 | Fill #0

## 2016-06-03 MED FILL — hydrALAZINE HCL 100 MG TABS: 100 | 30 days supply | Qty: 60 | Fill #0

## 2016-06-03 MED FILL — FUROSEMIDE 80 MG TABLET: 80 | 30 days supply | Qty: 120 | Fill #0

## 2016-06-03 MED FILL — cloNIDine HCL 0.2 MG TABS: 0.2 | 30 days supply | Qty: 60 | Fill #0

## 2016-06-03 NOTE — Progress Notes (Addendum)
DC instructions given to patient and family member at bedside. Educated on f/u appts, heart failure education. Questions answered. VSS. L IJ DC, hemostasis achieved. eICU and CCMD notified of DC. Pt DC home, via private vehicle driven by family.   Paged Dr. Jimmy Footman re: continue of glipizide and metformin? Reply orders: HOLD metformin, continue glipizide.

## 2016-06-03 NOTE — Discharge Summary (Signed)
Advanced Heart Failure Discharge Note   Discharge Summary   Patient ID: Jeremy [name] MRN: [id number], DOB/AGE: 1981/08/16 35 y.o. Admit date: 05/29/2016 D/C date:     06/03/2016   Primary Discharge Diagnoses:  1. AKI on CKD Stage III  2. Nephrotic Syndrome 3. Hypertensive Urgency 4. A/C diastolic CHF 5. New onset Anemia 6. Hypokalemia.     Hospital Course:   Jeremy [name] is a 36 y.o. male with PMH of Systolic CHF, HLD, HTN, CKD III, and snoring.  Admitted from clinic 05/29/16 after found to have significant volume overload. Pt was given IV lasix in clinic, and while waiting,  Labs resulted and were consistent with acute renal failure. Creatinine up to 3.6 from previous baseline of 1.6 - 1.8. Pt was admitted for further work up.   Renal consulted.   Lab work consistent for new anemia with Hgb of 8.3. Pt found to be Iron deficient and received IV Feraheme x 2. Pt also received dose of Aranesp, and to continue as outpatient per renal. Pt had no overt bleeding.   Pt also noted to have hypertensive emergency on admission with HTN with SBP upwards of 200. Started on Nitro drip and titrated off as able. Central Line placed for measurement of CVP and Coox.   Given 80 mg IV lasix initially, but held with CVP of 4 on exam.     Presumed to be nephrotic syndrome.   Pt developed severe HA on nitro gtt and was titrated off as tolerated as po meds titrated up.  SBPs eventually settled into 120 - 150s (leaving room for renal perfusion) with continued med adjustment.   Echo 05/30/16 with EF 55%.   Extensive renal work up consistent with Nephrotic syndrome. UPC with nephrotic rannge proteinuria at just over 12 grams.  Hepatitis panel negative.   Protein electrophoresis also significant for nephrotic syndrome. UPEP pending at time of discharge. Please see Chart for additional labs.   IV lasix restarted and eventually transitioned to po lasix (change from torsemide PTA) per renal.  Developed  hypokalemia with IV diuresis and supplemented as needed.   Pt underwent renal biopsy 06/02/16. Results pending as of discharge.   Overall patient diuresed 9.0 L and was down 20 lbs from admission weight (258 lbs in clinic)  Per Dr. Jimmy Footman, pt will need labwork (Renal function panel and CBC) at [facility] 06/06/16. This has been set up, and they will call pt with a time. They will also arrange Aranesp as outpatient.   He will be discharged to home in stable condition with follow up as below. Discussed with [facility], they are to call him to set up follow up. Pt instructed to call within one week if he has not heard from them.   Discharge Weight Range: 338 lbs Discharge Vitals: Blood pressure 131/79, pulse 75, temperature 98.4 F (36.9 C), temperature source Oral, resp. rate 16, height 6\' 5"  (1.956 m), weight (!) 338 lb (153.3 kg), SpO2 98 %.  Labs: Lab Results  Component Value Date   WBC 8.3 06/03/2016   HGB 8.0 (L) 06/03/2016   HCT 25.7 (L) 06/03/2016   MCV 81.8 06/03/2016   PLT 253 06/03/2016     Recent Labs Lab 06/03/16 0345  NA 139  K 4.6  CL 110  CO2 25  BUN 33*  CREATININE 3.42*  CALCIUM 7.6*  GLUCOSE 131*   Lab Results  Component Value Date   CHOL 180 11/29/2015   HDL 31 (L) 11/29/2015  LDLCALC 108 11/29/2015   TRIG 205 (H) 11/29/2015   BNP (last 3 results)  Recent Labs  01/03/16 1550 02/27/16 1115 05/29/16 0948  BNP 20.2 17.3 56.4    ProBNP (last 3 results) No results for input(s): PROBNP in the last 8760 hours.   Diagnostic Studies/Procedures   US Biopsy  Result Date: 06/02/2016 INDICATION: 35 year old male with acute on chronic renal failure an nephrotic range proteinuria. EXAM: ULTRASOUND BIOPSY CORE RENAL MEDICATIONS: None. ANESTHESIA/SEDATION: Moderate (conscious) sedation was employed during this procedure. A total of Versed 1.5 mg and Fentanyl 75 mcg was administered intravenously. Moderate Sedation Time: 11 minutes. The patient's  level of consciousness and vital signs were monitored continuously by radiology nursing throughout the procedure under my direct supervision. FLUOROSCOPY TIME:  Fluoroscopy Time: 0 minutes 0 seconds (0 mGy). COMPLICATIONS: None immediate. PROCEDURE: Informed written consent was obtained from the patient after a thorough discussion of the procedural risks, benefits and alternatives. All questions were addressed. Maximal Sterile Barrier Technique was utilized including caps, mask, sterile gowns, sterile gloves, sterile drape, hand hygiene and skin antiseptic. A timeout was performed prior to the initiation of the procedure. Both flanks were interrogated with ultrasound. Both kidneys are visible, however the right kidney provides a more accessible target. A suitable skin entry site was selected and marked. The overlying skin was prepped and draped in standard sterile fashion using chlorhexidine skin prep. Local anesthesia was attained by infiltration with 1% lidocaine. A small dermatotomy was made. Under real-time sonographic guidance, 216 gauge core biopsies were obtained of the lower pole renal cortex. Needle position within the renal parenchymal was confirmed sonographically on each needle pass. Specimens were placed in saline and delivered to pathology for further analysis. Post biopsy ultrasound imaging demonstrates no evidence of active hemorrhage or perinephric hematoma. IMPRESSION: Technically successful ultrasound-guided core biopsy of the lower pole of the right kidney. Electronically Signed   By: Jacqulynn Cadet M.D.   On: 06/02/2016 16:34    Discharge Medications     Medication List    STOP taking these medications   isosorbide mononitrate 60 MG 24 hr tablet Commonly known as:  IMDUR   lisinopril 20 MG tablet Commonly known as:  PRINIVIL,ZESTRIL   metFORMIN 1000 MG tablet Commonly known as:  GLUCOPHAGE   metolazone 2.5 MG tablet Commonly known as:  ZAROXOLYN   potassium chloride SA 20  MEQ tablet Commonly known as:  K-DUR,KLOR-CON   spironolactone 25 MG tablet Commonly known as:  ALDACTONE   torsemide 20 MG tablet Commonly known as:  DEMADEX     TAKE these medications   amLODipine 10 MG tablet Commonly known as:  NORVASC Take 1 tablet (10 mg total) by mouth at bedtime.   ASPIR-LOW 81 MG EC tablet Generic drug:  aspirin TAKE 1 TABLET BY MOUTH DAILY. What changed:  See the new instructions.   atorvastatin 80 MG tablet Commonly known as:  LIPITOR Take 1 tablet (80 mg total) by mouth daily.   carvedilol 25 MG tablet Commonly known as:  COREG Take 1 tablet (25 mg total) by mouth 2 (two) times daily with a meal. What changed:  medication strength  how much to take  how to take this  when to take this  additional instructions   cloNIDine 0.2 MG tablet Commonly known as:  CATAPRES Take 1 tablet (0.2 mg total) by mouth 2 (two) times daily.   furosemide 80 MG tablet Commonly known as:  LASIX Take 2 tablets (160 mg total) by mouth  2 (two) times daily.   glipiZIDE 5 MG tablet Commonly known as:  GLUCOTROL TAKE 1 TABLET BY MOUTH DAILY. What changed:  See the new instructions.   hydrALAZINE 100 MG tablet Commonly known as:  APRESOLINE Take 1 tablet (100 mg total) by mouth 2 (two) times daily. What changed:  when to take this   nitroGLYCERIN 0.4 MG SL tablet Commonly known as:  NITROSTAT Place 1 tablet (0.4 mg total) under the tongue every 5 (five) minutes as needed for chest pain.       Disposition   The patient will be discharged in stable condition to home. Discharge Instructions    (HEART FAILURE PATIENTS) Call MD:  Anytime you have any of the following symptoms: 1) 3 pound weight gain in 24 hours or 5 pounds in 1 week 2) shortness of breath, with or without a dry hacking cough 3) swelling in the hands, feet or stomach 4) if you have to sleep on extra pillows at night in order to breathe.    Complete by:  As directed    Diet - low sodium  heart healthy    Complete by:  As directed    Heart Failure patients record your daily weight using the same scale at the same time of day    Complete by:  As directed    Increase activity slowly    Complete by:  As directed      Follow-up Information    DUNHAM,CYNTHIA B, MD. Call in 1 week(s).   Specialty:  Nephrology Why:  Her office will call you for appointment.  Please call on Monday if you have not heard from them yet.  Contact information: [place] [address] [place] Follow up on 06/18/2016.   Specialty:  Cardiology Why:  at 0920 for post hospital follow up. Please bring ALL of your medications with you to your visit. The code for parking is 4000. Contact information: [address] [id number] [place] [phone]       [facility] Follow up on 06/06/2016.   Why:  For labwork.  They will call you with time Contact information: [address] [place] [address] [phone]             Duration of Discharge Encounter: Greater than 35 minutes   Signed, Shirley Friar PA-C 06/03/2016, 3:09 PM

## 2016-06-03 NOTE — Progress Notes (Signed)
Spoke w pt. He is being disch home today. Gave pt clinic list but pt[facility] pcp [facility]. He will follow up w cardiol and nephrology.

## 2016-06-03 NOTE — Progress Notes (Signed)
Advanced Heart Failure Rounding Note   Subjective:    Admitted with marked volume overload, AKI, HTN crisis, and anemia. Started on IV lasix and nitro drip.  24 urine with nephrotic range protein (12g).  S/p Renal biopsy 06/02/16  CVP 6-7. Creatinine remains elevated at 3.42    Feels "much better" today.  Anxious to go home.   SBP now down as low as 110s-120s at times.   Echo 9/22 with LVEF 55%. Negative 6.6 L and down 12 lbs so far.   Objective:   Weight Range:  Vital Signs:   Temp:  [97.2 F (36.2 C)-99.2 F (37.3 C)] 98.1 F (36.7 C) (09/26 0841) Pulse Rate:  [70-90] 73 (09/26 0841) Resp:  [13-25] 16 (09/26 0841) BP: (116-149)/(67-92) 125/76 (09/26 0347) SpO2:  [95 %-100 %] 98 % (09/26 0841) Weight:  [338 lb (153.3 kg)] 338 lb (153.3 kg) (09/26 0347) Last BM Date: 06/02/16  Weight change: Filed Weights   06/01/16 0300 06/02/16 0330 06/03/16 0347  Weight: (!) 334 lb 9.6 oz (151.8 kg) (!) 338 lb 1.6 oz (153.4 kg) (!) 338 lb (153.3 kg)    Intake/Output:   Intake/Output Summary (Last 24 hours) at 06/03/16 0917 Last data filed at 06/03/16 0700  Gross per 24 hour  Intake             1220 ml  Output             2925 ml  Net            -1705 ml     Physical Exam: CVP 6-7 General:  Obese male  No resp difficulty. In bed HEENT: Normal Neck: supple. JVP difficult to assess due to body habitus.  Carotids 2+ bilat; no bruits. No thyromegaly or nodule noted.  Cor: PMI nondisplaced. RRR. No M/G/R appreciated Lungs: Clear, normal effort Abdomen: obese soft, NT, ND, no HSM. No bruits or masses. +BS  Extremities: no cyanosis, clubbing, rash. Trace to 1+ ankle edema.  Neuro: alert & orientedx3, cranial nerves grossly intact. moves all 4 extremities w/o difficulty. Affect pleasant  Telemetry: Reviewed, NSR 70-80s  Labs: Basic Metabolic Panel:  Recent Labs Lab 05/30/16 0546 05/31/16 0500 06/01/16 0425 06/02/16 0500 06/03/16 0345  NA 142 144  143 141 139 139    K 3.6 3.6  3.6 3.4* 4.5 4.6  CL 109 110  109 109 108 110  CO2 '26 25  24 26 23 25  '$ GLUCOSE 117* 130*  131* 120* 125* 131*  BUN 36* 32*  32* 27* 34* 33*  CREATININE 3.68* 3.56*  3.59* 3.68* 3.48* 3.42*  CALCIUM 7.6* 8.2*  8.1* 7.7* 7.4* 7.6*  PHOS 4.9* 4.5  4.4 4.8* 4.2 4.7*    Liver Function Tests:  Recent Labs Lab 05/30/16 0546 05/31/16 0500 06/01/16 0425 06/02/16 0500 06/03/16 0345  ALBUMIN 1.1* 1.2*  1.2* <1.0* 1.1* 1.0*   No results for input(s): LIPASE, AMYLASE in the last 168 hours. No results for input(s): AMMONIA in the last 168 hours.  CBC:  Recent Labs Lab 05/30/16 0545  05/31/16 0500 06/01/16 0425 06/01/16 2254 06/02/16 0332 06/03/16 0345  WBC 7.9  --  9.6 9.1  --  9.6 8.3  NEUTROABS 5.2  --  7.0 5.8  --  6.1 5.2  HGB 7.5*  --  7.9* 7.1* 7.6* 7.7* 8.0*  HCT 24.6*  < > 26.0* 23.7* 24.0* 25.3* 25.7*  MCV 80.9  --  80.7 81.2  --  82.1 81.8  PLT  327  --  392 353  --  337 253  < > = values in this interval not displayed.  Cardiac Enzymes: No results for input(s): CKTOTAL, CKMB, CKMBINDEX, TROPONINI in the last 168 hours.  BNP: BNP (last 3 results)  Recent Labs  01/03/16 1550 02/27/16 1115 05/29/16 0948  BNP 20.2 17.3 56.4    ProBNP (last 3 results) No results for input(s): PROBNP in the last 8760 hours.    Other results:  Imaging: US Biopsy  Result Date: 06/02/2016 INDICATION: 35 year old male with acute on chronic renal failure an nephrotic range proteinuria. EXAM: ULTRASOUND BIOPSY CORE RENAL MEDICATIONS: None. ANESTHESIA/SEDATION: Moderate (conscious) sedation was employed during this procedure. A total of Versed 1.5 mg and Fentanyl 75 mcg was administered intravenously. Moderate Sedation Time: 11 minutes. The patient's level of consciousness and vital signs were monitored continuously by radiology nursing throughout the procedure under my direct supervision. FLUOROSCOPY TIME:  Fluoroscopy Time: 0 minutes 0 seconds (0 mGy).  COMPLICATIONS: None immediate. PROCEDURE: Informed written consent was obtained from the patient after a thorough discussion of the procedural risks, benefits and alternatives. All questions were addressed. Maximal Sterile Barrier Technique was utilized including caps, mask, sterile gowns, sterile gloves, sterile drape, hand hygiene and skin antiseptic. A timeout was performed prior to the initiation of the procedure. Both flanks were interrogated with ultrasound. Both kidneys are visible, however the right kidney provides a more accessible target. A suitable skin entry site was selected and marked. The overlying skin was prepped and draped in standard sterile fashion using chlorhexidine skin prep. Local anesthesia was attained by infiltration with 1% lidocaine. A small dermatotomy was made. Under real-time sonographic guidance, 216 gauge core biopsies were obtained of the lower pole renal cortex. Needle position within the renal parenchymal was confirmed sonographically on each needle pass. Specimens were placed in saline and delivered to pathology for further analysis. Post biopsy ultrasound imaging demonstrates no evidence of active hemorrhage or perinephric hematoma. IMPRESSION: Technically successful ultrasound-guided core biopsy of the lower pole of the right kidney. Electronically Signed   By: Jacqulynn Cadet M.D.   On: 06/02/2016 16:34     Medications:     Scheduled Medications: . amLODipine  10 mg Oral QHS  . atorvastatin  80 mg Oral Daily  . carvedilol  25 mg Oral BID WC  . cloNIDine  0.2 mg Oral BID  . darbepoetin (ARANESP) injection - NON-DIALYSIS  100 mcg Subcutaneous Q Fri-1800  . ferumoxytol  510 mg Intravenous Weekly  . furosemide  160 mg Oral BID  . hydrALAZINE  100 mg Oral BID  . insulin aspart  0-5 Units Subcutaneous QHS  . insulin aspart  0-9 Units Subcutaneous TID WC  . sodium chloride flush  10-40 mL Intracatheter Q12H  . sodium chloride flush  3 mL Intravenous Q12H     Infusions: . nitroGLYCERIN Stopped (06/01/16 0415)    PRN Medications: sodium chloride, acetaminophen, hydrALAZINE, nitroGLYCERIN, ondansetron (ZOFRAN) IV, oxyCODONE-acetaminophen, sodium chloride flush, sodium chloride flush, sorbitol   Assessment/Plan/Discussion   35 yo with history of nonischemic cardiomyopathy EF 50% with Grade 1 DD, HTN, suspected OSA, HLD, HTN, CKD stage III who presented to clinic with volume overload. Found to be in ARF on CKD stage III with anemia as well. .   1. AKI on CKD stage III: With very low albumin, peripheral edema with low to normal CVP, and proteinuria. ESR > 140 - 24 hour urine confirms nephrotic syndrome (12 g/day). Discussed with Dr. Lorrene Reid (Renal)  -  s/p Renal biopsy 06/02/16. Results pending.  - Hepatitis serologies, complement, dsDNA, ANCA, ANA, anti-GBM Ab, SPEP negative.   -  Continue 160 mg po lasix BID per renal.  2. Hypertensive urgency:  - Improved.  - Continue carvedilol 25 mg BID - Continue amlodipine 10 mg daily - Continue clonidine 0.2 bid and hydralazine 100 mg BID with normal EF - Now off Nitro gtt. Had severe headaches with nitro gtt 3. Chronic diastolic CHF: EF as low as 25% in past, 55% this admission (nonischemic CMP, thought to be related to HTN in the past).  - CVP 6-7 - On po lasix as above.  4. Anemia: New.  Fe-deficiency anemia.  Has received IV iron   GI following. No urgent need for endoscopy. Will follow as outpatient.  5. Hypokalemia -Stable today at K+ of 4.6.  Likely home today with close renal and HF follow up.   Shirley Friar, PA-C 06/03/16   Length of Stay: 5 Advanced Heart Failure Team Pager [phone] (M-F; 7a - 4p)  Please contact [facility] for night-coverage after hours (4p -7a ) and weekends on [email or web address]  Patient seen with PA, agree with the above note.  Volume stable today.  No dyspnea.  Creatinine elevated but stable.  Still pending results of renal biopsy.   I think he can go  home today.  Will need close followup with Dr Lorrene Reid regarding nephrotic syndrome.  We will see him in CHF clinic in 2 wks.  Meds for home: Lasix 160 mg bid, clonidine 0.2 bid, amlodipine 10 daily, atorvastatin 80 daily, Coreg 25 bid, hydralazine 100 tid.   Loralie Champagne 06/03/2016 10:47 AM

## 2016-06-03 NOTE — Progress Notes (Signed)
Subjective: Interval History: has complaints wants to go home.  Objective: Vital signs in last 24 hours: Temp:  [97.2 F (36.2 C)-99.2 F (37.3 C)] 97.2 F (36.2 C) (09/26 0347) Pulse Rate:  [70-90] 74 (09/26 0347) Resp:  [13-25] 24 (09/25 1643) BP: (116-149)/(67-92) 125/76 (09/26 0347) SpO2:  [95 %-100 %] 99 % (09/26 0347) Weight:  [153.3 kg (338 lb)] 153.3 kg (338 lb) (09/26 0347) Weight change: -0.045 kg (-1.6 oz)  Intake/Output from previous day: 09/25 0701 - 09/26 0700 In: 1220 [P.O.:1200; I.V.:20] Out: 0347 [Urine:3425] Intake/Output this shift: No intake/output data recorded.  General appearance: alert, cooperative, no distress and moderately obese Resp: clear to auscultation bilaterally Cardio: S1, S2 normal and systolic murmur: holosystolic 2/6, blowing at apex GI: obese, pos bs, soft Extremities: edema 2+  Lab Results:  Recent Labs  06/02/16 0332 06/03/16 0345  WBC 9.6 8.3  HGB 7.7* 8.0*  HCT 25.3* 25.7*  PLT 337 253   BMET:  Recent Labs  06/02/16 0500 06/03/16 0345  NA 139 139  K 4.5 4.6  CL 108 110  CO2 23 25  GLUCOSE 125* 131*  BUN 34* 33*  CREATININE 3.48* 3.42*  CALCIUM 7.4* 7.6*   No results for input(s): PTH in the last 72 hours. Iron Studies: No results for input(s): IRON, TIBC, TRANSFERRIN, FERRITIN in the last 72 hours.  Studies/Results: US Biopsy  Result Date: 06/02/2016 INDICATION: 35 year old male with acute on chronic renal failure an nephrotic range proteinuria. EXAM: ULTRASOUND BIOPSY CORE RENAL MEDICATIONS: None. ANESTHESIA/SEDATION: Moderate (conscious) sedation was employed during this procedure. A total of Versed 1.5 mg and Fentanyl 75 mcg was administered intravenously. Moderate Sedation Time: 11 minutes. The patient's level of consciousness and vital signs were monitored continuously by radiology nursing throughout the procedure under my direct supervision. FLUOROSCOPY TIME:  Fluoroscopy Time: 0 minutes 0 seconds (0 mGy).  COMPLICATIONS: None immediate. PROCEDURE: Informed written consent was obtained from the patient after a thorough discussion of the procedural risks, benefits and alternatives. All questions were addressed. Maximal Sterile Barrier Technique was utilized including caps, mask, sterile gowns, sterile gloves, sterile drape, hand hygiene and skin antiseptic. A timeout was performed prior to the initiation of the procedure. Both flanks were interrogated with ultrasound. Both kidneys are visible, however the right kidney provides a more accessible target. A suitable skin entry site was selected and marked. The overlying skin was prepped and draped in standard sterile fashion using chlorhexidine skin prep. Local anesthesia was attained by infiltration with 1% lidocaine. A small dermatotomy was made. Under real-time sonographic guidance, 216 gauge core biopsies were obtained of the lower pole renal cortex. Needle position within the renal parenchymal was confirmed sonographically on each needle pass. Specimens were placed in saline and delivered to pathology for further analysis. Post biopsy ultrasound imaging demonstrates no evidence of active hemorrhage or perinephric hematoma. IMPRESSION: Technically successful ultrasound-guided core biopsy of the lower pole of the right kidney. Electronically Signed   By: Jacqulynn Cadet M.D.   On: 06/02/2016 16:34    I have reviewed the patient's current medications.  Assessment/Plan: 1  AKI, NS.  AKI may have been accel HTN vs ATN with NS. Not clear.   May have FSG, vs Membranous vs other in backround.  BP under control. BX pending.  Can D/C to out patient f/u 2 HTN better, have lowered meds 3 DM controlled 4 Obesity 5 Anemia stable P lower hydral, lower clonidine cont diuretics, d/c per cards, f/u with Dr.Dunham  LOS: 5 days   Jeremy [name] 06/03/2016,8:09 AM

## 2016-06-05 ENCOUNTER — Telehealth (HOSPITAL_COMMUNITY): Payer: Self-pay | Admitting: Cardiology

## 2016-06-05 NOTE — Telephone Encounter (Signed)
Patients s/o called with concerns regarding increased BLE edema since discharge. Reports he has not missed any doses of meds. Patient denies increased SOB, cough,or CP.  Patient is unable to check weight as he does not have a scale.  HFU 10/11

## 2016-06-05 NOTE — Telephone Encounter (Signed)
Pt on max dose lasix per renal.     He has labs at their office tomorrow.   1. Needs to buy a scale.  2. Ask Renal about increasing meds vs metolazone tomorrow.  3. Please have him asked to have labs faxed to [place] as well.     Legrand Como [address] [place], [place] 06/05/2016 3:43 PM

## 2016-06-06 NOTE — Telephone Encounter (Signed)
Patients s/o aware and voiced understanding

## 2016-06-11 ENCOUNTER — Telehealth: Payer: Self-pay | Admitting: Internal Medicine

## 2016-06-11 NOTE — Telephone Encounter (Signed)
Called pt - no answer; left message to give us a call back. 

## 2016-06-11 NOTE — Telephone Encounter (Signed)
Pt calling in reference to his Diabetes medications.  Please call back to [phone].

## 2016-06-12 ENCOUNTER — Ambulatory Visit (INDEPENDENT_AMBULATORY_CARE_PROVIDER_SITE_OTHER): Payer: Self-pay | Admitting: Internal Medicine

## 2016-06-12 VITALS — BP 135/82 | HR 84 | Temp 97.9°F | Ht 77.0 in | Wt 327.6 lb

## 2016-06-12 DIAGNOSIS — I130 Hypertensive heart and chronic kidney disease with heart failure and stage 1 through stage 4 chronic kidney disease, or unspecified chronic kidney disease: Secondary | ICD-10-CM

## 2016-06-12 DIAGNOSIS — E1121 Type 2 diabetes mellitus with diabetic nephropathy: Secondary | ICD-10-CM

## 2016-06-12 DIAGNOSIS — Z7984 Long term (current) use of oral hypoglycemic drugs: Secondary | ICD-10-CM

## 2016-06-12 DIAGNOSIS — I5042 Chronic combined systolic (congestive) and diastolic (congestive) heart failure: Secondary | ICD-10-CM

## 2016-06-12 DIAGNOSIS — B9689 Other specified bacterial agents as the cause of diseases classified elsewhere: Secondary | ICD-10-CM

## 2016-06-12 DIAGNOSIS — I10 Essential (primary) hypertension: Secondary | ICD-10-CM

## 2016-06-12 DIAGNOSIS — L02215 Cutaneous abscess of perineum: Secondary | ICD-10-CM

## 2016-06-12 DIAGNOSIS — Z79899 Other long term (current) drug therapy: Secondary | ICD-10-CM

## 2016-06-12 DIAGNOSIS — Z7982 Long term (current) use of aspirin: Secondary | ICD-10-CM

## 2016-06-12 DIAGNOSIS — N183 Chronic kidney disease, stage 3 (moderate): Secondary | ICD-10-CM

## 2016-06-12 DIAGNOSIS — E1122 Type 2 diabetes mellitus with diabetic chronic kidney disease: Secondary | ICD-10-CM

## 2016-06-12 LAB — GLUCOSE, CAPILLARY: GLUCOSE-CAPILLARY: 142 mg/dL — AB (ref 65–99)

## 2016-06-12 MED ORDER — SULFAMETHOXAZOLE-TRIMETHOPRIM 400-80 MG PO TABS: 2.0000 | ORAL_TABLET | Freq: Two times a day (BID) | ORAL | 0 refills | Status: AC

## 2016-06-12 MED FILL — SULFAMETHOXAZOLE/TMP SS TAB: 400-80 | 10 days supply | Qty: 40 | Fill #0

## 2016-06-12 NOTE — Telephone Encounter (Signed)
Called pt again- no answer; message left to call [facility] back.

## 2016-06-12 NOTE — Patient Instructions (Addendum)
Please take the bactrim, 2 pills twice a day for 10 days.  If your wound is not draining, feels like it is swelling up again, or you develop fever, please call the clinic and we will see you.  We will see you in about 2 weeks (please schedule after your kidney doctor appointment, so we can see how you're doing.

## 2016-06-12 NOTE — Progress Notes (Signed)
   CC: boil in groin  HPI:  [name] is a 35 y.o. with a PMH of CHF, HLD, HTN, CKD stage 3 recently hospitalized for CHF exacerbation, hypertensive urgency and AKI with nephrotic syndrome.   Please see problem based Assessment and Plan for status of patients chronic conditions.  Past Medical History:  Diagnosis Date  . Abscess of left groin   . Asthma   . Chest pain    a. 01/2015 Lexiscan MV: EF 28%, inferior, inferolateral, apical ischemia;  b. 01/2015 Cath: nl cors, PCWP 18 mmHg, CO 9.38 L/min, CI 3.53 L/min/m^2.  . Essential hypertension   . Hyperlipidemia   . Morbid obesity ([place])   . Nonischemic cardiomyopathy ([place])    a. 01/2015 Echo: EF 20-25%, diff HK, Gr 2 DD, Triv AI, mildly dil LA and Ao root.  . Type II diabetes mellitus ([place])    a. 01/2015 HbA1c = 8.9.    Review of Systems:   Review of Systems  Constitutional: Negative for chills and fever.  Eyes: Negative for blurred vision and double vision.  Respiratory: Negative for cough.   Cardiovascular: Positive for leg swelling. Negative for chest pain and orthopnea.  Gastrointestinal: Negative for abdominal pain and nausea.  Genitourinary: Positive for frequency (due to furosemide). Negative for dysuria and hematuria.       "boil" on groin, nondraining  Skin: Negative for rash.  Neurological: Negative for headaches.  Endo/Heme/Allergies: Negative for polydipsia.    Physical Exam:  Vitals:   06/12/16 1042  BP: 135/82  Pulse: 84  Temp: 97.9 F (36.6 C)  TempSrc: Oral  SpO2: 94%  Weight: (!) 327 lb 9.6 oz (148.6 kg)  Height: 6\' 5"  (1.956 m)   Physical Exam  Constitutional: NAD, pleasant CV: RRR, no murmurs, rubs or gallops appreciated Resp: CTAB, no wheezing or crackles appreciated, no increased work of breathing Abd: soft, +BS, non distended, nontender Ext: warm, 2+ pulses, 1+ pitting edema to mid tibia GU: R scrotal absess about 2x1cm, mildly tender, mild erythema in central region of abscess; no  drainage noted  Assessment & Plan:   See Encounters Tab for problem based charting.  Procedure: Right sided Perineal Abscess  I&D  Patient was consented with witness present for abscess I&D. Area was prepped with iodine swabs; a small incision in the center of the abscess was made with a scalpel blade and abscess began draining freely; fluid was brown with white streaks and malodorous. A sample was obtained for stain and culture. About 25cc's of fluid was drained. Packing of wound was attempted but unsuccessful. Wound was again swabbed with iodine and covered with 4x4 gauze.   Patient seen with Dr. Verdie Drown, MD Internal Medicine PGY1

## 2016-06-14 ENCOUNTER — Encounter: Payer: Self-pay | Admitting: Internal Medicine

## 2016-06-14 NOTE — Assessment & Plan Note (Signed)
Patient with 3 day history of scrotal abscess without systemic symptoms. Patient has a history of abscesses in the perineal and genital region that have been successfully I&D'd and treated with antibiotics. On exam, abscess is ~2x1cm, non draining, with central blanching and surrounding erythema that does not extend beyond abscess borders. Patient is afebrile and with otherwise stable vital signs.  Plan: --I&D of abscess --obtained culture of aspirate > prelim staining shows Gram - rods; will f/u culture and susceptibility results --started on double strength bactrim x10day --discussed return precautions of nonresolution, worsening, or development of systemic symptoms

## 2016-06-14 NOTE — Assessment & Plan Note (Signed)
BP well controlled; 135/82 in clinic. Patient on amlodipine 10mg  daily, carvedilol 25mg  BID, Clonidine 0.2mg  BID, hydralazine 100mg  BID which is a change from pre-hospitalization; asssured adherence to new regimen. Patient following up with heart failure clinic 10/11.  Plan: --f/u with cardiology 10/11 --stable

## 2016-06-14 NOTE — Assessment & Plan Note (Addendum)
Patient with T2DM previously controlled with metformin 1000mg  BID and glipizide 5mg  daily. Last A1c 7.2 in august. During hospitalization, patient had AKI and found to have nephrotic syndrome; metformin was held. 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 Patient states that since discharge, AM CBG's have been around 130 consistently.  Plan: --obtain records from [place] [facility] for renal function --hold metformin for now, continue glipizide --has appointment with PCP 11/3; will need to find alternative for metformin if renal function has not improved

## 2016-06-14 NOTE — Assessment & Plan Note (Signed)
Patient with recent admission for CHF exacerbation and nephrotic syndrome. Echo on 9/22 showed EF 55% with no regional wall motion abnormalities. Medications include asa 81mg , amlodipine 10mg  daily, atorvastatin 80mg  daily, carvedilol 25mg  BID, clonidine 0.2mg  BID, furosemide 160mg  BID, hydralazine 100mg  BID, and nitro 0.4mg  SL PRN. Patient states his weight has been stable since discharge, he has had no chest pain or shortness of breath and his LE edema has been stable.   Plan: --Stable --continue current regimen; f/u with heart failure clinic 10/11 --f/u blood work obtained from nephrology clinic

## 2016-06-15 LAB — WOUND CULTURE: Organism ID, Bacteria: NONE SEEN

## 2016-06-16 ENCOUNTER — Encounter (HOSPITAL_COMMUNITY): Payer: Self-pay

## 2016-06-16 NOTE — Progress Notes (Signed)
Medicine attending: I personally interviewed and briefly examined this patient on the day of the patient visit and reviewed pertinent clinical ,laboratory, and radiographic data  with resident physician Dr. Alphonzo Grieve and we discussed a management plan. Hypertensive, insulin dependent diabetic with progressive renal failure, recently found to have nephrotic range proteinuria. He is now on high dose diuretics. He has had problems with recurrent cutaneous abscesses. He has a new abscess just below his scrotum, midline. No fever.  I personally supervised and assisted with an incision and drainage procedure. We will prescribe a course of oral antibiotocs.

## 2016-06-18 ENCOUNTER — Encounter (HOSPITAL_COMMUNITY): Payer: Self-pay

## 2016-06-18 ENCOUNTER — Ambulatory Visit (HOSPITAL_COMMUNITY)
Admission: RE | Admit: 2016-06-18 | Discharge: 2016-06-18 | Disposition: A | Discharge status: Home / Self Care | Payer: Medicaid Other | Source: Ambulatory Visit | Attending: Cardiology | Admitting: Cardiology

## 2016-06-18 VITALS — BP 160/94 | HR 85 | Ht 77.0 in | Wt 318.2 lb

## 2016-06-18 DIAGNOSIS — I130 Hypertensive heart and chronic kidney disease with heart failure and stage 1 through stage 4 chronic kidney disease, or unspecified chronic kidney disease: Secondary | ICD-10-CM | POA: Insufficient documentation

## 2016-06-18 DIAGNOSIS — E1122 Type 2 diabetes mellitus with diabetic chronic kidney disease: Secondary | ICD-10-CM | POA: Diagnosis not present

## 2016-06-18 DIAGNOSIS — Z79899 Other long term (current) drug therapy: Secondary | ICD-10-CM | POA: Insufficient documentation

## 2016-06-18 DIAGNOSIS — I5022 Chronic systolic (congestive) heart failure: Secondary | ICD-10-CM | POA: Diagnosis present

## 2016-06-18 DIAGNOSIS — E785 Hyperlipidemia, unspecified: Secondary | ICD-10-CM | POA: Insufficient documentation

## 2016-06-18 DIAGNOSIS — N183 Chronic kidney disease, stage 3 unspecified: Secondary | ICD-10-CM

## 2016-06-18 DIAGNOSIS — I129 Hypertensive chronic kidney disease with stage 1 through stage 4 chronic kidney disease, or unspecified chronic kidney disease: Secondary | ICD-10-CM

## 2016-06-18 DIAGNOSIS — I428 Other cardiomyopathies: Secondary | ICD-10-CM | POA: Diagnosis not present

## 2016-06-18 DIAGNOSIS — Z7982 Long term (current) use of aspirin: Secondary | ICD-10-CM | POA: Insufficient documentation

## 2016-06-18 DIAGNOSIS — G4730 Sleep apnea, unspecified: Secondary | ICD-10-CM

## 2016-06-18 DIAGNOSIS — E784 Other hyperlipidemia: Secondary | ICD-10-CM

## 2016-06-18 DIAGNOSIS — D649 Anemia, unspecified: Secondary | ICD-10-CM | POA: Diagnosis not present

## 2016-06-18 DIAGNOSIS — E7849 Other hyperlipidemia: Secondary | ICD-10-CM

## 2016-06-18 DIAGNOSIS — R29818 Other symptoms and signs involving the nervous system: Secondary | ICD-10-CM

## 2016-06-18 DIAGNOSIS — I5042 Chronic combined systolic (congestive) and diastolic (congestive) heart failure: Secondary | ICD-10-CM

## 2016-06-18 LAB — BASIC METABOLIC PANEL
ANION GAP: 5 (ref 5–15)
BUN: 21 mg/dL — AB (ref 6–20)
CALCIUM: 8.2 mg/dL — AB (ref 8.9–10.3)
CO2: 24 mmol/L (ref 22–32)
CREATININE: 3.25 mg/dL — AB (ref 0.61–1.24)
Chloride: 111 mmol/L (ref 101–111)
GFR calc Af Amer: 27 mL/min — ABNORMAL LOW (ref >60)
GFR, EST NON AFRICAN AMERICAN: 23 mL/min — AB (ref >60)
GLUCOSE: 153 mg/dL — AB (ref 65–99)
Potassium: 3.3 mmol/L — ABNORMAL LOW (ref 3.5–5.1)
Sodium: 140 mmol/L (ref 135–145)

## 2016-06-18 MED ORDER — FUROSEMIDE 80 MG PO TABS: 80.0000 mg | ORAL_TABLET | Freq: Two times a day (BID) | ORAL | 6 refills | Status: DC

## 2016-06-18 NOTE — Patient Instructions (Signed)
Routine lab work today. Will notify you of abnormal results, otherwise no news is good news!  HOLD Lasix today and tomorrow. On Friday, DECREASE Lasix to 80 mg twice daily.  Follow up 4 weeks with Amy Clegg NP-C.  Do the following things EVERYDAY: 1) Weigh yourself in the morning before breakfast. Write it down and keep it in a log. 2) Take your medicines as prescribed 3) Eat low salt foods-Limit salt (sodium) to 2000 mg per day.  4) Stay as active as you can everyday 5) Limit all fluids for the day to less than 2 liters

## 2016-06-18 NOTE — Progress Notes (Signed)
Patient ID: Jeremy [name], male   DOB: 1980-10-26, 35 y.o.   MRN: [id number]    Advanced Heart Failure Clinic Note   PCP: Dr. Randell Patient Primary cardiologist: Dr. Aundra Dubin  35 yo with history of nonischemic cardiomyopathy, HTN, and DM presents for cardiology followup.  In 4/16, patient was seen in the ER with edema and chest pain.  The only medication he was taking was metformin.  BP was elevated.  Echo showed EF 20-25%, and Cardiolite suggested ischemia.  He had RHC/LHC in 5/16.  This showed no coronary disease and mildly elevated filling pressures.    Admitted 9/21 through 06/03/16 with marked volume overload and hypertensive crisis. Diuresed with IV lasix and transitioned to lasix 160 mg twice daily. Due to worsening renal function, nephrology consulted. Work up consistent with nephrotic syndrome. He has follow up nephrology. D had renal biopsy. ischarge creatinine 3.42. Discharge weight was 338 pounds.   He presents today for post hospital follow up. Says his weight has been trending down since discharge. Weight at home 315-320 pounds. Says he has had dizziness when standing. Denies SOB/PND/Orthopnea. Taking all medications.   Labs (6/16): LDL 308, HDL 46, TGs 284 Labs (7/16): K 3.5, creatinine 1.0 Labs 05/24/2015: K 3.9 Creatinine 1.43  Labs (3/17): creatinine 1.55, LDL 108, HDL 31 Labs (9/17): hemoglobin 8.3, K 4, BUN 40, creatinine 3.69, BNP 56 Labs (06/03/2016) : K 4.6 Creatinine 3.42   PMH: 1. Type II diabetes since 2006.  2. HTN since around 2010.   3. Asthma 4. Hyperlipidemia 5. Morbid obesity 6. Cardiomyopathy: Nonischemic.  Echo (5/16) with EF 20-25%, diffuse hypokinesis, grade II diastolic dysfunction.  Lexiscan Cardiolite (5/16) with EF 28%, inferior/inferolateral/apical ischemia.  RHC/LHC (5/16) with normal coronaries, mean RA 11, PA 33/21 mean 28, mean PCWP 18, CI 2.53.  - Echo (4/17): EF 50%, moderate LVH, normal RV size and systolic function. Grade IDD  7. ABIs normal 8/16.  8.  CKD 9. ECHO 05/2016 EF 55%. Grade I DD  SH: Lives with girlfriend, nonsmoker, no cocaine, occasional marijuana, no ETOH.  Works as Art gallery manager.  FH: Father with CHF, died at 24.  Sister with CHF, PAD.   ROS: All systems reviewed and negative except as per HPI.    Current Outpatient Prescriptions  Medication Sig Dispense Refill  . amLODipine (NORVASC) 10 MG tablet Take 1 tablet (10 mg total) by mouth at bedtime. 30 tablet 6  . ASPIR-LOW 81 MG EC tablet TAKE 1 TABLET BY MOUTH DAILY. (Patient taking differently: Take 81 mg by mouth daily) 30 tablet 11  . atorvastatin (LIPITOR) 80 MG tablet Take 1 tablet (80 mg total) by mouth daily. 90 tablet 2  . carvedilol (COREG) 25 MG tablet Take 1 tablet (25 mg total) by mouth 2 (two) times daily with a meal. 60 tablet 6  . cloNIDine (CATAPRES) 0.2 MG tablet Take 1 tablet (0.2 mg total) by mouth 2 (two) times daily. 60 tablet 6  . furosemide (LASIX) 80 MG tablet Take 2 tablets (160 mg total) by mouth 2 (two) times daily. 120 tablet 6  . glipiZIDE (GLUCOTROL) 5 MG tablet TAKE 1 TABLET BY MOUTH DAILY. (Patient taking differently: Take 5 mg by mouth daily) 30 tablet PRN  . hydrALAZINE (APRESOLINE) 100 MG tablet Take 1 tablet (100 mg total) by mouth 2 (two) times daily. 60 tablet 6  . nitroGLYCERIN (NITROSTAT) 0.4 MG SL tablet Place 1 tablet (0.4 mg total) under the tongue every 5 (five) minutes as needed for chest pain. 25  tablet 3  . sulfamethoxazole-trimethoprim (BACTRIM) 400-80 MG tablet Take 2 tablets by mouth 2 (two) times daily. 40 tablet 0   No current facility-administered medications for this encounter.    BP (!) 160/94 (BP Location: Left Arm, Patient Position: Sitting, Cuff Size: Large)   Pulse 85   Ht 6\' 5"  (1.956 m)   Wt (!) 318 lb 3.2 oz (144.3 kg)   SpO2 99%   BMI 37.73 kg/m    Wt Readings from Last 3 Encounters:  06/18/16 (!) 318 lb 3.2 oz (144.3 kg)  06/12/16 (!) 327 lb 9.6 oz (148.6 kg)  06/03/16 (!) 338 lb (153.3 kg)     General:  NAD Neck: Thick, JVP flat, no thyromegaly or thyroid nodule noted. Wife present Lungs: Clear CV: Nondisplaced PMI.  Heart regular S1/S2, no S3/S4, no murmur. No carotid bruit.   Abdomen: Obese, Soft, NT, mildly distended, no HSM. No bruits or masses. +BS  Skin: Intact without lesions or rashes.  Neurologic: Alert and oriented x 3.  Psych: Normal affect. Extremities: No clubbing or cyanosis. No edema.  HEENT: Normal.   Assessment/Plan:  1. Chronic systolic CHF: Nonischemic cardiomyopathy. ?from long-standing HTN. EF was 20-25% in 206 but ECHO 12/2015 had showed improvement to EF 50%, Grade 1 DD.   ECHO 05/2016 EF 55%.  Today he does not appear volume overloaded. Has had 20 pound weight loss since discharge. He is not orthostatic today. . Hold lasix for 2 days then start lasix 80 mg twice daily. (During recent admit torsemide stopped by renal . Check BMET today. Continue carvedilol 25 mg twice daily.  - Continue hydralazine 100 mg twice daily. No Imdur with headache.  - no spiro or ace with ckd.  2. Suspected OSA: Reschedule sleep study.   3.  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  LDL much better in 3/17, suspect related to compliance with atorvastatin.  - Continue atorvastatin 80 mg daily. 4. HTN: SBP at home ok. !40s. Continue current regimen. Hydralazine 100 mg twice a day, cloinidine 0.2 mg twice daily, amlodipine 10 mg dialy. 5. CKD III: ?Cause.  Nephrotic Syndrome noted last admit. [name] Has follow up with Nephrology October 17th. onsult renal 6. Anemia: Received fereheme with recent admit.   Follow up in 4 weeks with Dr Aundra Dubin.   BMET today.   Nishka Heide NP-C   06/18/2016 9:52 AM

## 2016-06-19 ENCOUNTER — Telehealth (HOSPITAL_COMMUNITY): Payer: Self-pay

## 2016-06-19 MED ORDER — POTASSIUM CHLORIDE CRYS ER 20 MEQ PO TBCR: 20.0000 meq | EXTENDED_RELEASE_TABLET | Freq: Every day | ORAL | 3 refills | Status: DC

## 2016-06-19 MED FILL — NITROGLYCERIN 0.4 MG TAB SL: 0.4 | 15 days supply | Qty: 25 | Fill #2

## 2016-06-19 NOTE — Telephone Encounter (Signed)
Basic metabolic panel  Order: [id number]  Status:  Final result Visible to patient:  No (Not Released) Dx:  Chronic combined systolic and diastol...  Notes Recorded by Effie Berkshire, RN on 06/19/2016 at 2:55 PM EDT Patient aware of results, Rx sent for 90 day supply to [facility] per patient request. Patient reports doing well, denies any questions/concerns/needs at this time. ------  Notes Recorded by Conrad Deming, NP on 06/18/2016 at 1:16 PM EDT Please call start 20 meq potassium dialy

## 2016-06-24 MED FILL — glipiZIDE 5 MG TABS: 5 | 90 days supply | Qty: 90 | Fill #8

## 2016-06-24 MED FILL — TACROLIMUS 1 MG CAPSULE: 1 | 90 days supply | Qty: 180 | Fill #0

## 2016-06-25 ENCOUNTER — Telehealth (HOSPITAL_COMMUNITY): Payer: Self-pay | Admitting: *Deleted

## 2016-06-25 MED FILL — KLOR-CON M20 TABLET: 20 | 30 days supply | Qty: 30 | Fill #10

## 2016-06-25 NOTE — Telephone Encounter (Signed)
Jeremy [name] left a VM on triage line to clarify pt's new order for KCL, she states the nephrologist told him not to take it so she wanted our opinion.   Attempted to call back and Left message to call back

## 2016-06-26 ENCOUNTER — Ambulatory Visit: Payer: Self-pay

## 2016-06-27 ENCOUNTER — Ambulatory Visit (INDEPENDENT_AMBULATORY_CARE_PROVIDER_SITE_OTHER): Payer: Self-pay | Admitting: Internal Medicine

## 2016-06-27 VITALS — BP 128/73 | HR 76 | Temp 98.0°F | Ht 77.0 in | Wt 321.4 lb

## 2016-06-27 DIAGNOSIS — Z7984 Long term (current) use of oral hypoglycemic drugs: Secondary | ICD-10-CM

## 2016-06-27 DIAGNOSIS — E1165 Type 2 diabetes mellitus with hyperglycemia: Secondary | ICD-10-CM

## 2016-06-27 DIAGNOSIS — E1121 Type 2 diabetes mellitus with diabetic nephropathy: Secondary | ICD-10-CM

## 2016-06-27 MED ORDER — INSULIN GLARGINE 100 UNIT/ML ~~LOC~~ SOLN: SUBCUTANEOUS | 3 refills | Status: DC

## 2016-06-27 MED ORDER — BLOOD GLUCOSE MONITOR KIT: PACK | 0 refills | Status: DC

## 2016-06-27 MED FILL — TRUE METRIX GLUCOSE TEST ST: 25 days supply | Qty: 100 | Fill #0

## 2016-06-27 MED FILL — LANTUS 100 UNITS/ML VIAL: 100 | 28 days supply | Qty: 10 | Fill #0

## 2016-06-27 MED FILL — TRUEplus LANCETS 30G MISC: 25 days supply | Qty: 100 | Fill #0

## 2016-06-27 NOTE — Progress Notes (Signed)
   CC: Patient is here to discuss his diabetes.  HPI:  [name] is a 35 y.o. male with a past medical history of conditions listed below presenting to the clinic to discuss his diabetes. Please see problem based charting for the status of the patient's current and chronic medical conditions.   Past Medical History:  Diagnosis Date  . Abscess of left groin   . Acute blood loss anemia 11/11/2013  . Asthma   . Boil of scrotum 11/21/2015  . Chest pain    a. 01/2015 Lexiscan MV: EF 28%, inferior, inferolateral, apical ischemia;  b. 01/2015 Cath: nl cors, PCWP 18 mmHg, CO 9.38 L/min, CI 3.53 L/min/m^2.  . Essential hypertension   . Hyperlipidemia   . Morbid obesity ([place])   . Nonischemic cardiomyopathy ([place])    a. 01/2015 Echo: EF 20-25%, diff HK, Gr 2 DD, Triv AI, mildly dil LA and Ao root.  . Type II diabetes mellitus ([place])    a. 01/2015 HbA1c = 8.9.    Review of Systems:  Pertinent positives mentioned in HPI. Remainder of all ROS negative.   Physical Exam:  Vitals:   06/27/16 0920  BP: 128/73  Pulse: 76  Temp: 98 F (36.7 C)  TempSrc: Oral  SpO2: 100%  Weight: (!) 321 lb 6.4 oz (145.8 kg)  Height: 6\' 5"  (1.956 m)   Physical Exam  Constitutional: He is oriented to person, place, and time. He appears well-developed and well-nourished. No distress.  HENT:  Head: Normocephalic and atraumatic.  Mouth/Throat: Oropharynx is clear and moist.  Eyes: EOM are normal. Right eye exhibits no discharge. Left eye exhibits no discharge.  Neck: Neck supple. No tracheal deviation present.  Cardiovascular: Normal rate, regular rhythm and intact distal pulses.   Pulmonary/Chest: Breath sounds normal. No respiratory distress.  Abdominal: Soft. Bowel sounds are normal. He exhibits no distension. There is no tenderness.  Musculoskeletal: Normal range of motion. He exhibits no edema.  Neurological: He is alert and oriented to person, place, and time.  Skin: Skin is warm and dry.     Assessment & Plan:   See Encounters Tab for problem based charting.  Patient discussed with Dr. Angelia Mould

## 2016-06-27 NOTE — Patient Instructions (Addendum)
Jeremy [name] it was nice meeting you today.  -Inject Lantus 5 units at bedtime as instructed  -Check your blood sugar every morning before eating. Give insulin 3 days to work before making any changes.  After that, if your blood sugar is >140 you can inject 1-2 extra units of Lantus that night. Then again wait 3 days before making changes. DO NOT take more than 10 units of Lantus.   -Continue taking Glipizide 5 mg daily  -You have an appointmwent with Dr. Posey Pronto on 07/11/2016 at 1:15 pm.

## 2016-06-27 NOTE — Assessment & Plan Note (Signed)
History of present illness Patient was hospitalized in September 2017 for acute kidney injury and found to have nephrotic syndrome; Metformin was held. 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 Patient is currently taking only glipizide 5 mg daily. States his nephrologist Dr. Lorrene Reid has started him on Prograf and is concerned his blood sugars might go up. States he checks his blood glucose once a day and values have been in the 130s to 150s; highest in 180s and lowest in the 130s. He did not bring his meter to this visit.  Assessment Uncontrolled type 2 diabetes. A1c 7.2 in August 2017.  Plan -Continue glipizide 5 mg daily -Start Lantus 5 units at bedtime. Advised patient to titrate the dose up by 1-2 units every 3 days if fasting morning CBG greater than 140. -Patient requested a new meter. Prescription for meter and supplies has been sent to [facility] outpatient pharmacy -Educated patient about healthy eating and exercise. Emphasized the importance of weight loss.  -Return to clinic in 2 weeks with meter

## 2016-06-30 NOTE — Progress Notes (Signed)
Internal Medicine Clinic Attending  Case discussed with Dr. Rathoreat the time of the visit. We reviewed the resident's history and exam and pertinent patient test results. I agree with the assessment, diagnosis, and plan of care documented in the resident's note.  

## 2016-07-01 ENCOUNTER — Ambulatory Visit: Payer: Self-pay

## 2016-07-01 MED FILL — CARVEDILOL 25 MG TABLET: 25 | 30 days supply | Qty: 60 | Fill #1

## 2016-07-01 MED FILL — AMLODIPINE BESYLATE 10 MG T: 10 | 30 days supply | Qty: 30 | Fill #1

## 2016-07-01 MED FILL — cloNIDine HCL 0.2 MG TABS: 0.2 | 30 days supply | Qty: 60 | Fill #1

## 2016-07-01 MED FILL — hydrALAZINE HCL 100 MG TABS: 100 | 30 days supply | Qty: 60 | Fill #1

## 2016-07-01 MED FILL — FUROSEMIDE 80 MG TABLET: 80 | 30 days supply | Qty: 120 | Fill #1

## 2016-07-01 MED FILL — ATORVASTATIN 80 MG TABLET: 80 | 30 days supply | Qty: 30 | Fill #1

## 2016-07-03 ENCOUNTER — Other Ambulatory Visit (HOSPITAL_COMMUNITY): Payer: Self-pay | Admitting: *Deleted

## 2016-07-04 ENCOUNTER — Ambulatory Visit (HOSPITAL_COMMUNITY)
Admission: RE | Admit: 2016-07-04 | Discharge: 2016-07-04 | Disposition: A | Discharge status: Home / Self Care | Payer: Medicaid Other | Source: Ambulatory Visit | Attending: Nephrology | Admitting: Nephrology

## 2016-07-04 DIAGNOSIS — Z79899 Other long term (current) drug therapy: Secondary | ICD-10-CM | POA: Diagnosis not present

## 2016-07-04 DIAGNOSIS — D509 Iron deficiency anemia, unspecified: Secondary | ICD-10-CM | POA: Diagnosis not present

## 2016-07-04 DIAGNOSIS — Z5181 Encounter for therapeutic drug level monitoring: Secondary | ICD-10-CM | POA: Diagnosis not present

## 2016-07-04 DIAGNOSIS — D631 Anemia in chronic kidney disease: Secondary | ICD-10-CM | POA: Diagnosis not present

## 2016-07-04 DIAGNOSIS — N184 Chronic kidney disease, stage 4 (severe): Secondary | ICD-10-CM | POA: Diagnosis present

## 2016-07-04 LAB — POCT HEMOGLOBIN-HEMACUE: HEMOGLOBIN: 8.5 g/dL — AB (ref 13.0–17.0)

## 2016-07-04 MED ORDER — DARBEPOETIN ALFA 100 MCG/0.5ML IJ SOSY
PREFILLED_SYRINGE | INTRAMUSCULAR | Status: AC
  Administered 2016-07-04: 100 ug
  Filled 2016-07-04: qty 0.5

## 2016-07-04 MED ORDER — SODIUM CHLORIDE 0.9 % IV SOLN
510.0000 mg | Freq: Once | INTRAVENOUS | Status: AC
  Administered 2016-07-04: 510 mg via INTRAVENOUS
  Filled 2016-07-04: qty 17

## 2016-07-04 MED ORDER — DARBEPOETIN ALFA 100 MCG/0.5ML IJ SOSY: 100.0000 ug | PREFILLED_SYRINGE | Freq: Once | INTRAMUSCULAR | Status: DC

## 2016-07-04 NOTE — Discharge Instructions (Signed)
Ferumoxytol injection What is this medicine? FERUMOXYTOL is an iron complex. Iron is used to make healthy red blood cells, which carry oxygen and nutrients throughout the body. This medicine is used to treat iron deficiency anemia in people with chronic kidney disease. This medicine may be used for other purposes; ask your health care provider or pharmacist if you have questions. What should I tell my health care provider before I take this medicine? They need to know if you have any of these conditions: -anemia not caused by low iron levels -high levels of iron in the blood -magnetic resonance imaging (MRI) test scheduled -an unusual or allergic reaction to iron, other medicines, foods, dyes, or preservatives -pregnant or trying to get pregnant -breast-feeding How should I use this medicine? This medicine is for injection into a vein. It is given by a health care professional in a hospital or clinic setting. Talk to your pediatrician regarding the use of this medicine in children. Special care may be needed. Overdosage: If you think you have taken too much of this medicine contact a poison control center or emergency room at once. NOTE: This medicine is only for you. Do not share this medicine with others. What if I miss a dose? It is important not to miss your dose. Call your doctor or health care professional if you are unable to keep an appointment. What may interact with this medicine? This medicine may interact with the following medications: -other iron products This list may not describe all possible interactions. Give your health care provider a list of all the medicines, herbs, non-prescription drugs, or dietary supplements you use. Also tell them if you smoke, drink alcohol, or use illegal drugs. Some items may interact with your medicine. What should I watch for while using this medicine? Visit your doctor or healthcare professional regularly. Tell your doctor or healthcare  professional if your symptoms do not start to get better or if they get worse. You may need blood work done while you are taking this medicine. You may need to follow a special diet. Talk to your doctor. Foods that contain iron include: whole grains/cereals, dried fruits, beans, or peas, leafy green vegetables, and organ meats (liver, kidney). What side effects may I notice from receiving this medicine? Side effects that you should report to your doctor or health care professional as soon as possible: -allergic reactions like skin rash, itching or hives, swelling of the face, lips, or tongue -breathing problems -changes in blood pressure -feeling faint or lightheaded, falls -fever or chills -flushing, sweating, or hot feelings -swelling of the ankles or feet Side effects that usually do not require medical attention (Report these to your doctor or health care professional if they continue or are bothersome.): -diarrhea -headache -nausea, vomiting -stomach pain This list may not describe all possible side effects. Call your doctor for medical advice about side effects. You may report side effects to FDA at 1-800-FDA-1088. Where should I keep my medicine? This drug is given in a hospital or clinic and will not be stored at home. NOTE: This sheet is a summary. It may not cover all possible information. If you have questions about this medicine, talk to your doctor, pharmacist, or health care provider.    2016, Elsevier/Gold Standard. (2012-04-09 15:23:36) Darbepoetin Alfa injection What is this medicine? DARBEPOETIN ALFA (dar be POE e tin AL fa) helps your body make more red blood cells. It is used to treat anemia caused by chronic kidney failure and chemotherapy.  This medicine may be used for other purposes; ask your health care provider or pharmacist if you have questions. What should I tell my health care provider before I take this medicine? They need to know if you have any of these  conditions: -blood clotting disorders or history of blood clots -cancer patient not on chemotherapy -cystic fibrosis -heart disease, such as angina, heart failure, or a history of a heart attack -hemoglobin level of 12 g/dL or greater -high blood pressure -low levels of folate, iron, or vitamin B12 -seizures -an unusual or allergic reaction to darbepoetin, erythropoietin, albumin, hamster proteins, latex, other medicines, foods, dyes, or preservatives -pregnant or trying to get pregnant -breast-feeding How should I use this medicine? This medicine is for injection into a vein or under the skin. It is usually given by a health care professional in a hospital or clinic setting. If you get this medicine at home, you will be taught how to prepare and give this medicine. Do not shake the solution before you withdraw a dose. Use exactly as directed. Take your medicine at regular intervals. Do not take your medicine more often than directed. It is important that you put your used needles and syringes in a special sharps container. Do not put them in a trash can. If you do not have a sharps container, call your pharmacist or healthcare provider to get one. Talk to your pediatrician regarding the use of this medicine in children. While this medicine may be used in children as young as 1 year for selected conditions, precautions do apply. Overdosage: If you think you have taken too much of this medicine contact a poison control center or emergency room at once. NOTE: This medicine is only for you. Do not share this medicine with others. What if I miss a dose? If you miss a dose, take it as soon as you can. If it is almost time for your next dose, take only that dose. Do not take double or extra doses. What may interact with this medicine? Do not take this medicine with any of the following medications: -epoetin alfa This list may not describe all possible interactions. Give your health care provider a  list of all the medicines, herbs, non-prescription drugs, or dietary supplements you use. Also tell them if you smoke, drink alcohol, or use illegal drugs. Some items may interact with your medicine. What should I watch for while using this medicine? Visit your prescriber or health care professional for regular checks on your progress and for the needed blood tests and blood pressure measurements. It is especially important for the doctor to make sure your hemoglobin level is in the desired range, to limit the risk of potential side effects and to give you the best benefit. Keep all appointments for any recommended tests. Check your blood pressure as directed. Ask your doctor what your blood pressure should be and when you should contact him or her. As your body makes more red blood cells, you may need to take iron, folic acid, or vitamin B supplements. Ask your doctor or health care provider which products are right for you. If you have kidney disease continue dietary restrictions, even though this medication can make you feel better. Talk with your doctor or health care professional about the foods you eat and the vitamins that you take. What side effects may I notice from receiving this medicine? Side effects that you should report to your doctor or health care professional as soon as possible: -  allergic reactions like skin rash, itching or hives, swelling of the face, lips, or tongue -breathing problems -changes in vision -chest pain -confusion, trouble speaking or understanding -feeling faint or lightheaded, falls -high blood pressure -muscle aches or pains -pain, swelling, warmth in the leg -rapid weight gain -severe headaches -sudden numbness or weakness of the face, arm or leg -trouble walking, dizziness, loss of balance or coordination -seizures (convulsions) -swelling of the ankles, feet, hands -unusually weak or tired Side effects that usually do not require medical attention (report  to your doctor or health care professional if they continue or are bothersome): -diarrhea -fever, chills (flu-like symptoms) -headaches -nausea, vomiting -redness, stinging, or swelling at site where injected This list may not describe all possible side effects. Call your doctor for medical advice about side effects. You may report side effects to FDA at 1-800-FDA-1088. Where should I keep my medicine? Keep out of the reach of children. Store in a refrigerator between 2 and 8 degrees C (36 and 46 degrees F). Do not freeze. Do not shake. Throw away any unused portion if using a single-dose vial. Throw away any unused medicine after the expiration date. NOTE: This sheet is a summary. It may not cover all possible information. If you have questions about this medicine, talk to your doctor, pharmacist, or health care provider.    2016, Elsevier/Gold Standard. (2008-08-08 10:23:57)

## 2016-07-10 ENCOUNTER — Encounter: Payer: Self-pay | Admitting: Internal Medicine

## 2016-07-11 ENCOUNTER — Encounter: Payer: Self-pay | Admitting: Internal Medicine

## 2016-07-11 NOTE — Progress Notes (Deleted)
   CC: ***  HPI:  [name] is a 35 y.o. *** who presents today for ***. Please see assessment & plan for status of chronic medical problems.   Past Medical History:  Diagnosis Date  . Abscess of left groin   . Acute blood loss anemia 11/11/2013  . Asthma   . Boil of scrotum 11/21/2015  . Chest pain    a. 01/2015 Lexiscan MV: EF 28%, inferior, inferolateral, apical ischemia;  b. 01/2015 Cath: nl cors, PCWP 18 mmHg, CO 9.38 L/min, CI 3.53 L/min/m^2.  . Essential hypertension   . Hyperlipidemia   . Morbid obesity ([place])   . Nonischemic cardiomyopathy ([place])    a. 01/2015 Echo: EF 20-25%, diff HK, Gr 2 DD, Triv AI, mildly dil LA and Ao root.  . Type II diabetes mellitus ([place])    a. 01/2015 HbA1c = 8.9.    Review of Systems:  Please see each problem below for a pertinent review of systems.   Physical Exam:  There were no vitals filed for this visit. ***  Assessment & Plan:   See Encounters Tab for problem based charting.  Patient {GC/GE:[id number]::"discussed with","seen with"} Dr. {NAMES:[id number]::"Butcher","Granfortuna","E. Hoffman","Klima","Mullen","Narendra","Vincent"}

## 2016-07-15 ENCOUNTER — Ambulatory Visit (HOSPITAL_COMMUNITY)
Admission: RE | Admit: 2016-07-15 | Discharge: 2016-07-15 | Disposition: A | Discharge status: Home / Self Care | Payer: Medicaid Other | Source: Ambulatory Visit | Attending: Cardiology | Admitting: Cardiology

## 2016-07-15 ENCOUNTER — Encounter (HOSPITAL_COMMUNITY): Payer: Self-pay

## 2016-07-15 VITALS — BP 120/74 | HR 66 | Wt 315.0 lb

## 2016-07-15 DIAGNOSIS — R29818 Other symptoms and signs involving the nervous system: Secondary | ICD-10-CM

## 2016-07-15 DIAGNOSIS — G4730 Sleep apnea, unspecified: Secondary | ICD-10-CM

## 2016-07-15 DIAGNOSIS — Z794 Long term (current) use of insulin: Secondary | ICD-10-CM | POA: Diagnosis not present

## 2016-07-15 DIAGNOSIS — I5032 Chronic diastolic (congestive) heart failure: Secondary | ICD-10-CM | POA: Diagnosis present

## 2016-07-15 DIAGNOSIS — Z79899 Other long term (current) drug therapy: Secondary | ICD-10-CM | POA: Diagnosis not present

## 2016-07-15 DIAGNOSIS — I130 Hypertensive heart and chronic kidney disease with heart failure and stage 1 through stage 4 chronic kidney disease, or unspecified chronic kidney disease: Secondary | ICD-10-CM | POA: Diagnosis not present

## 2016-07-15 DIAGNOSIS — D649 Anemia, unspecified: Secondary | ICD-10-CM | POA: Diagnosis not present

## 2016-07-15 DIAGNOSIS — I5042 Chronic combined systolic (congestive) and diastolic (congestive) heart failure: Secondary | ICD-10-CM

## 2016-07-15 DIAGNOSIS — E785 Hyperlipidemia, unspecified: Secondary | ICD-10-CM | POA: Insufficient documentation

## 2016-07-15 DIAGNOSIS — J45909 Unspecified asthma, uncomplicated: Secondary | ICD-10-CM | POA: Insufficient documentation

## 2016-07-15 DIAGNOSIS — I428 Other cardiomyopathies: Secondary | ICD-10-CM | POA: Diagnosis not present

## 2016-07-15 DIAGNOSIS — Z8249 Family history of ischemic heart disease and other diseases of the circulatory system: Secondary | ICD-10-CM | POA: Diagnosis not present

## 2016-07-15 DIAGNOSIS — E1121 Type 2 diabetes mellitus with diabetic nephropathy: Secondary | ICD-10-CM | POA: Diagnosis not present

## 2016-07-15 DIAGNOSIS — Z7982 Long term (current) use of aspirin: Secondary | ICD-10-CM | POA: Diagnosis not present

## 2016-07-15 DIAGNOSIS — N049 Nephrotic syndrome with unspecified morphologic changes: Secondary | ICD-10-CM

## 2016-07-15 MED ORDER — HYDRALAZINE HCL 100 MG PO TABS: 100.0000 mg | ORAL_TABLET | Freq: Three times a day (TID) | ORAL | 6 refills | Status: DC

## 2016-07-15 MED FILL — hydrALAZINE HCL 100 MG TABS: 100 | 30 days supply | Qty: 90 | Fill #0

## 2016-07-15 NOTE — Patient Instructions (Signed)
Increase Hydralazine to 100 mg Three times a day   Your physician has recommended that you have a sleep study. This test records several body functions during sleep, including: brain activity, eye movement, oxygen and carbon dioxide blood levels, heart rate and rhythm, breathing rate and rhythm, the flow of air through your mouth and nose, snoring, body muscle movements, and chest and belly movement.  We will contact you in 3 months to schedule your next appointment.

## 2016-07-16 ENCOUNTER — Telehealth: Payer: Self-pay | Admitting: Internal Medicine

## 2016-07-16 ENCOUNTER — Encounter (HOSPITAL_COMMUNITY): Payer: Self-pay

## 2016-07-16 NOTE — Telephone Encounter (Signed)
APT. REMINDER CALL, NO ANSWER, MAILBOX FULL °

## 2016-07-16 NOTE — Progress Notes (Signed)
Patient ID: Jeremy [name], male   DOB: [birth year], 35 y.o.   MRN: [id number]    Advanced Heart Failure Clinic Note   PCP: Dr. Randell Patient Primary cardiologist: Dr. Aundra Dubin  35 yo with history of nonischemic cardiomyopathy, HTN, and DM presents for cardiology followup.  In 4/16, patient was seen in the ER with edema and chest pain.  The only medication he was taking was metformin.  BP was elevated.  Echo showed EF 20-25%, and Cardiolite suggested ischemia.  He had RHC/LHC in 5/16.  This showed no coronary disease and mildly elevated filling pressures.    Admitted 9/21 through 06/03/16 with marked volume overload and hypertensive crisis. Diuresed with IV lasix and transitioned to lasix 160 mg twice daily. Due to worsening renal function, nephrology consulted. Work up consistent with nephrotic syndrome. 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  Discharge creatinine 3.42.  He has been following with renal. He has been getting iron infusions and feels like he has more energy.    No exertional dyspnea, lightheadedness, or orthopnea/PND. SBP still running > 140 frequently at home.    Labs (6/16): LDL 308, HDL 46, TGs 284 Labs (7/16): K 3.5, creatinine 1.0 Labs 05/24/2015: K 3.9 Creatinine 1.43  Labs (3/17): creatinine 1.55, LDL 108, HDL 31 Labs (9/17): hemoglobin 8.3, K 4, BUN 40, creatinine 3.69, BNP 56 Labs (06/03/2016) : K 4.6 Creatinine 3.42  Labs (10/17): K 4.4, creatinine 3.44, hgb 9.4  PMH: 1. Type II diabetes since 2006.  2. HTN since around 2010.   3. Asthma 4. Hyperlipidemia 5. Morbid obesity 6. Cardiomyopathy: Nonischemic.  Echo (5/16) with EF 20-25%, diffuse hypokinesis, grade II diastolic dysfunction.  Lexiscan Cardiolite (5/16) with EF 28%, inferior/inferolateral/apical ischemia.  RHC/LHC (5/16) with normal coronaries, mean RA 11, PA 33/21 mean 28, mean PCWP 18, CI 2.53.  - Echo (4/17): EF 50%, moderate LVH, normal RV size and systolic  function. Grade IDD  - Echo (9/17): EF 55%.  7. ABIs normal 8/16.  8. CKD: AKI in 9/17 with diagnosis of nephrotic syndrome. 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   SH: Lives with girlfriend, nonsmoker, no cocaine, occasional marijuana, no ETOH.  Works as Art gallery manager.  FH: Father with CHF, died at 36.  Sister with CHF, PAD.   ROS: All systems reviewed and negative except as per HPI.    Current Outpatient Prescriptions  Medication Sig Dispense Refill  . amLODipine (NORVASC) 10 MG tablet Take 1 tablet (10 mg total) by mouth at bedtime. 30 tablet 6  . ASPIR-LOW 81 MG EC tablet TAKE 1 TABLET BY MOUTH DAILY. 30 tablet 11  . atorvastatin (LIPITOR) 80 MG tablet Take 1 tablet (80 mg total) by mouth daily. 90 tablet 2  . blood glucose meter kit and supplies KIT Dispense based on patient and insurance preference. Use up to four times daily as directed. (FOR ICD-9 250.00, 250.01). [facility] IM Program. 1 each 0  . carvedilol (COREG) 25 MG tablet Take 1 tablet (25 mg total) by mouth 2 (two) times daily with a meal. 60 tablet 6  . cloNIDine (CATAPRES) 0.2 MG tablet Take 1 tablet (0.2 mg total) by mouth 2 (two) times daily. 60 tablet 6  . furosemide (LASIX) 80 MG tablet Take 1 tablet (80 mg total) by mouth 2 (two) times daily. 60 tablet 6  . glipiZIDE (GLUCOTROL) 5 MG tablet TAKE 1 TABLET BY MOUTH DAILY. 30 tablet PRN  . hydrALAZINE (APRESOLINE) 100 MG tablet Take 1 tablet (  100 mg total) by mouth 3 (three) times daily. 90 tablet 6  . insulin glargine (LANTUS) 100 UNIT/ML injection Inject 5 units into the skin at bedtime. (IM Program) 10 mL 3  . potassium chloride SA (KLOR-CON M20) 20 MEQ tablet Take 1 tablet (20 mEq total) by mouth daily. 90 tablet 3  . nitroGLYCERIN (NITROSTAT) 0.4 MG SL tablet Place 1 tablet (0.4 mg total) under the tongue every 5 (five) minutes as needed for chest pain. (Patient not taking: Reported on 07/15/2016) 25 tablet 3   No current  facility-administered medications for this encounter.    BP 120/74   Pulse 66   Wt (!) 315 lb (142.9 kg)   SpO2 100%   BMI 37.35 kg/m    Wt Readings from Last 3 Encounters:  07/15/16 (!) 315 lb (142.9 kg)  07/04/16 (!) 310 lb (140.6 kg)  06/27/16 (!) 321 lb 6.4 oz (145.8 kg)     General: NAD Neck: Thick, JVP flat, no thyromegaly or thyroid nodule noted. Wife present Lungs: Clear CV: Nondisplaced PMI.  Heart regular S1/S2, no S3/S4, no murmur. No carotid bruit.   Abdomen: Obese, Soft, NT, mildly distended, no HSM. No bruits or masses. +BS  Skin: Intact without lesions or rashes.  Neurologic: Alert and oriented x 3.  Psych: Normal affect. Extremities: No clubbing or cyanosis. No edema.  HEENT: Normal.   Assessment/Plan:  1. Chronic diastolic CHF: Nonischemic cardiomyopathy. ?from long-standing HTN. EF was 20-25% in 206 but ECHO 12/2015 had showed improvement to EF 50%, Grade 1 DD.   ECHO 05/2016 EF 55%. Today he does not appear volume overloaded.  - Continue Lasix 80 mg bid.  2. Suspected OSA: Reschedule sleep study.   3.  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  LDL much better in 3/17, suspect related to compliance with atorvastatin.  - Continue atorvastatin 80 mg daily. 4. HTN: BP still > 140 when he checks at home. - Continue current amlodipine, Coreg, clonidine.  - Increase hydralazine to 100 mg tid.  - Check BP daily and call us with readings.   5. 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 Follows with renal.  Creatinine stable around 3.4. 6. Anemia: Getting iron infusions per renal.   Followup in 3 months.   Loralie Champagne 07/16/2016

## 2016-07-17 ENCOUNTER — Ambulatory Visit: Payer: Self-pay

## 2016-07-17 ENCOUNTER — Encounter: Payer: Self-pay | Admitting: Internal Medicine

## 2016-07-18 ENCOUNTER — Encounter: Payer: Self-pay | Admitting: Nephrology

## 2016-07-18 DIAGNOSIS — N189 Chronic kidney disease, unspecified: Secondary | ICD-10-CM | POA: Insufficient documentation

## 2016-07-18 DIAGNOSIS — D631 Anemia in chronic kidney disease: Secondary | ICD-10-CM | POA: Insufficient documentation

## 2016-07-18 DIAGNOSIS — D509 Iron deficiency anemia, unspecified: Secondary | ICD-10-CM | POA: Insufficient documentation

## 2016-07-23 MED FILL — KLOR-CON M20 TABLET: 20 | 30 days supply | Qty: 30 | Fill #11

## 2016-07-23 MED FILL — ATORVASTATIN 80 MG TABLET: 80 | 30 days supply | Qty: 30 | Fill #2

## 2016-07-23 MED FILL — cloNIDine HCL 0.2 MG TABS: 0.2 | 30 days supply | Qty: 60 | Fill #2

## 2016-07-23 MED FILL — NITROGLYCERIN 0.4 MG TAB SL: 0.4 | 15 days supply | Qty: 25 | Fill #3

## 2016-07-28 ENCOUNTER — Other Ambulatory Visit (HOSPITAL_COMMUNITY): Payer: Self-pay | Admitting: *Deleted

## 2016-07-29 ENCOUNTER — Ambulatory Visit (HOSPITAL_COMMUNITY)
Admission: RE | Admit: 2016-07-29 | Discharge: 2016-07-29 | Disposition: A | Discharge status: Home / Self Care | Payer: Medicaid Other | Source: Ambulatory Visit | Attending: Nephrology | Admitting: Nephrology

## 2016-07-29 DIAGNOSIS — N184 Chronic kidney disease, stage 4 (severe): Secondary | ICD-10-CM | POA: Diagnosis not present

## 2016-07-29 DIAGNOSIS — Z5181 Encounter for therapeutic drug level monitoring: Secondary | ICD-10-CM | POA: Insufficient documentation

## 2016-07-29 DIAGNOSIS — Z79899 Other long term (current) drug therapy: Secondary | ICD-10-CM | POA: Diagnosis not present

## 2016-07-29 DIAGNOSIS — D631 Anemia in chronic kidney disease: Secondary | ICD-10-CM | POA: Diagnosis not present

## 2016-07-29 LAB — POCT HEMOGLOBIN-HEMACUE: Hemoglobin: 9.4 g/dL — ABNORMAL LOW (ref 13.0–17.0)

## 2016-07-29 MED ORDER — DARBEPOETIN ALFA 100 MCG/0.5ML IJ SOSY
100.0000 ug | PREFILLED_SYRINGE | INTRAMUSCULAR | Status: DC
  Administered 2016-07-29: 100 ug via SUBCUTANEOUS

## 2016-07-29 MED ORDER — DARBEPOETIN ALFA 100 MCG/0.5ML IJ SOSY
PREFILLED_SYRINGE | INTRAMUSCULAR | Status: AC
  Filled 2016-07-29: qty 0.5

## 2016-07-29 MED FILL — TACROLIMUS 1 MG CAPSULE: 1 | 15 days supply | Qty: 150 | Fill #0

## 2016-07-29 MED FILL — CARVEDILOL 25 MG TABLET: 25 | 30 days supply | Qty: 60 | Fill #2

## 2016-08-05 MED FILL — AMLODIPINE BESYLATE 10 MG T: 10 | 30 days supply | Qty: 30 | Fill #2

## 2016-08-07 MED FILL — AMOX-CLAV 500-125 MG TABLET: 500-125 | 7 days supply | Qty: 14 | Fill #0

## 2016-08-12 ENCOUNTER — Telehealth (HOSPITAL_COMMUNITY): Payer: Self-pay | Admitting: *Deleted

## 2016-08-12 MED ORDER — CLONIDINE HCL 0.3 MG PO TABS
0.3000 mg | ORAL_TABLET | Freq: Two times a day (BID) | ORAL | 3 refills | Status: DC
Start: 2016-08-12 — End: 2016-08-13

## 2016-08-12 MED FILL — LISINOPRIL 20 MG TABLET: 20 | 30 days supply | Qty: 30 | Fill #2

## 2016-08-12 MED FILL — hydrALAZINE HCL 100 MG TABS: 100 | 30 days supply | Qty: 90 | Fill #1

## 2016-08-12 NOTE — Telephone Encounter (Signed)
Pt's girlfriend called to report that pt needs refill of his Clonidine, she states he was told by Dr Lorrene Reid to take an extra tab if BP is elevated.  She states he has been doing this about 4 times a week so he is out of med and pharmacy told him it is too early to get refilled.  Discussed w/Amy Ninfa Meeker, NP she states to increase Clonidine to 0.3 mg BID and continue to monitor BP and c/b if still elevated, pt's girlfriend aware, new rx sent in

## 2016-08-13 ENCOUNTER — Other Ambulatory Visit (HOSPITAL_COMMUNITY): Payer: Self-pay | Admitting: Cardiology

## 2016-08-13 ENCOUNTER — Other Ambulatory Visit (HOSPITAL_COMMUNITY): Payer: Self-pay

## 2016-08-13 MED ORDER — CLONIDINE HCL 0.3 MG PO TABS: 0.3000 mg | ORAL_TABLET | Freq: Two times a day (BID) | ORAL | 3 refills | Status: DC

## 2016-08-13 MED FILL — KLOR-CON M20 TABLET: 20 | 30 days supply | Qty: 30 | Fill #0

## 2016-08-13 MED FILL — ATORVASTATIN 80 MG TABLET: 80 | 30 days supply | Qty: 30 | Fill #3

## 2016-08-13 MED FILL — cloNIDine HCL 0.3 MG TABS: 0.3 | 30 days supply | Qty: 60 | Fill #0

## 2016-08-20 MED FILL — LANTUS 100 UNITS/ML VIAL: 100 | 28 days supply | Qty: 10 | Fill #1

## 2016-08-26 ENCOUNTER — Ambulatory Visit (HOSPITAL_COMMUNITY)
Admission: RE | Admit: 2016-08-26 | Discharge: 2016-08-26 | Disposition: A | Discharge status: Home / Self Care | Payer: Medicaid Other | Source: Ambulatory Visit | Attending: Nephrology | Admitting: Nephrology

## 2016-08-26 DIAGNOSIS — D631 Anemia in chronic kidney disease: Secondary | ICD-10-CM | POA: Diagnosis not present

## 2016-08-26 DIAGNOSIS — Z5181 Encounter for therapeutic drug level monitoring: Secondary | ICD-10-CM | POA: Insufficient documentation

## 2016-08-26 DIAGNOSIS — N184 Chronic kidney disease, stage 4 (severe): Secondary | ICD-10-CM | POA: Diagnosis not present

## 2016-08-26 DIAGNOSIS — Z79899 Other long term (current) drug therapy: Secondary | ICD-10-CM | POA: Diagnosis not present

## 2016-08-26 LAB — IRON AND TIBC
IRON: 52 ug/dL (ref 45–182)
SATURATION RATIOS: 32 % (ref 17.9–39.5)
TIBC: 162 ug/dL — AB (ref 250–450)
UIBC: 110 ug/dL

## 2016-08-26 LAB — FERRITIN: FERRITIN: 456 ng/mL — AB (ref 24–336)

## 2016-08-26 LAB — POCT HEMOGLOBIN-HEMACUE: Hemoglobin: 11.3 g/dL — ABNORMAL LOW (ref 13.0–17.0)

## 2016-08-26 MED ORDER — DARBEPOETIN ALFA 100 MCG/0.5ML IJ SOSY
100.0000 ug | PREFILLED_SYRINGE | INTRAMUSCULAR | Status: DC
  Administered 2016-08-26: 100 ug via SUBCUTANEOUS

## 2016-08-26 MED ORDER — DARBEPOETIN ALFA 100 MCG/0.5ML IJ SOSY
PREFILLED_SYRINGE | INTRAMUSCULAR | Status: AC
  Administered 2016-08-26: 100 ug via SUBCUTANEOUS
  Filled 2016-08-26: qty 0.5

## 2016-08-26 MED FILL — TACROLIMUS 1 MG CAPSULE: 1 | 15 days supply | Qty: 150 | Fill #1

## 2016-08-29 MED FILL — CARVEDILOL 25 MG TABLET: 25 | 30 days supply | Qty: 60 | Fill #3

## 2016-09-05 ENCOUNTER — Ambulatory Visit (HOSPITAL_BASED_OUTPATIENT_CLINIC_OR_DEPARTMENT_OTHER): Payer: Medicaid Other | Attending: Cardiology

## 2016-09-05 MED FILL — AMLODIPINE BESYLATE 10 MG T: 10 | 30 days supply | Qty: 30 | Fill #3

## 2016-09-05 MED FILL — glipiZIDE 5 MG TABS: 5 | 90 days supply | Qty: 90 | Fill #9

## 2016-09-09 MED FILL — cloNIDine HCL 0.3 MG TABS: 0.3 | 30 days supply | Qty: 60 | Fill #1

## 2016-09-12 MED FILL — LANTUS 100 UNITS/ML VIAL: 100 | 28 days supply | Qty: 10 | Fill #2

## 2016-09-12 MED FILL — ATORVASTATIN 80 MG TABLET: 80 | 30 days supply | Qty: 30 | Fill #4

## 2016-09-12 MED FILL — KLOR-CON M20 TABLET: 20 | 30 days supply | Qty: 30 | Fill #1

## 2016-09-12 MED FILL — FUROSEMIDE 80 MG TABLET: 80 | 30 days supply | Qty: 120 | Fill #2

## 2016-09-18 ENCOUNTER — Encounter: Payer: Self-pay | Admitting: Internal Medicine

## 2016-09-18 ENCOUNTER — Ambulatory Visit (INDEPENDENT_AMBULATORY_CARE_PROVIDER_SITE_OTHER): Payer: Self-pay | Admitting: Internal Medicine

## 2016-09-18 VITALS — BP 104/66 | HR 68 | Temp 97.7°F | Ht 77.0 in | Wt 307.4 lb

## 2016-09-18 DIAGNOSIS — E1165 Type 2 diabetes mellitus with hyperglycemia: Secondary | ICD-10-CM

## 2016-09-18 DIAGNOSIS — Z79899 Other long term (current) drug therapy: Secondary | ICD-10-CM

## 2016-09-18 DIAGNOSIS — E1121 Type 2 diabetes mellitus with diabetic nephropathy: Secondary | ICD-10-CM

## 2016-09-18 DIAGNOSIS — I10 Essential (primary) hypertension: Secondary | ICD-10-CM

## 2016-09-18 DIAGNOSIS — Z794 Long term (current) use of insulin: Secondary | ICD-10-CM

## 2016-09-18 LAB — BASIC METABOLIC PANEL
ANION GAP: 4 — AB (ref 5–15)
BUN: 27 mg/dL — ABNORMAL HIGH (ref 6–20)
CHLORIDE: 108 mmol/L (ref 101–111)
CO2: 25 mmol/L (ref 22–32)
Calcium: 8.6 mg/dL — ABNORMAL LOW (ref 8.9–10.3)
Creatinine, Ser: 1.85 mg/dL — ABNORMAL HIGH (ref 0.61–1.24)
GFR, EST AFRICAN AMERICAN: 52 mL/min — AB (ref >60)
GFR, EST NON AFRICAN AMERICAN: 45 mL/min — AB (ref >60)
Glucose, Bld: 210 mg/dL — ABNORMAL HIGH (ref 65–99)
POTASSIUM: 4.9 mmol/L (ref 3.5–5.1)
SODIUM: 137 mmol/L (ref 135–145)

## 2016-09-18 LAB — POCT GLYCOSYLATED HEMOGLOBIN (HGB A1C): Hemoglobin A1C: >14

## 2016-09-18 LAB — GLUCOSE, CAPILLARY: Glucose-Capillary: 242 mg/dL — ABNORMAL HIGH (ref 65–99)

## 2016-09-18 MED ORDER — NEEDLES & SYRINGES MISC: 2 refills | Status: DC

## 2016-09-18 MED ORDER — INSULIN GLARGINE 100 UNIT/ML ~~LOC~~ SOLN: SUBCUTANEOUS | 3 refills | Status: DC

## 2016-09-18 MED ORDER — SODIUM CHLORIDE 0.9 % IV BOLUS (SEPSIS)
1000.0000 mL | Freq: Once | INTRAVENOUS | Status: AC
  Administered 2016-09-18: 1000 mL via INTRAVENOUS

## 2016-09-18 MED ORDER — INSULIN ASPART 100 UNIT/ML ~~LOC~~ SOLN: 10.0000 [IU] | Freq: Three times a day (TID) | SUBCUTANEOUS | 2 refills | Status: DC

## 2016-09-18 MED FILL — NovoLOG 100 UNIT/ML SOLN: 100 | 30 days supply | Qty: 10 | Fill #0

## 2016-09-18 NOTE — Progress Notes (Signed)
1635 PM - NS bolus completed; BP 104/66.  IV d/c with catheter intact; site unremarkable.

## 2016-09-18 NOTE — Progress Notes (Signed)
CC: DM follow up  HPI:  [name] is a 36 y.o. male with a past medical history listed below here today for follow up of his DM.  [name] was seen in clinic on 10/20 for his diabetes at a hospital follow up visit. He had been found to have an acute kidney injury with nephrotic syndrome. 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 With his reduced renal function he was taken off metformin. His glipizide 5 mg daily was continued at that time and he was started on Lantus 5 untis qhs. He was instructed to follow up in 2 weeks with his meter for recheck. He has not followed up in clinic until today.   Today, he reports he was seen by his nephrologist on 09/12/16 and was called after the visit that his blood sugars were >1000. Requested by nephrology that he follow up with his PCP. Since that time he reports he has been taking Lantus 2-3 times a day when he feels that his sugar are running high. Says that he has been giving himself 10-20 units at time. Did not bring his meter with him today. Says his sugars have been running in the 400s at home. CBG 242 in clinic today. No episodes of hypoglycemia. Did not feel different when his sugars were >1000 but after getting it down into the 400s he realized he was feeling tired and sluggish. Polyuria and polydipsia present. Still having generalized weakness/malasie but denies any other symptoms. No nausea, vomiting, diarrhea, blurry vision, headache. Reports good PO intake but wife reports he has not been eating or drinking much due to fear of his blood sugars jumping up again.   His blood pressure today is 97/59. He does report feeling lightheaded/dizzy with standing over the past couple of days. Does check his blood pressure at home and reports usually running 120/80s at home.   Last A1c was 7.2 in 04/2016; A1c > 14 today.   Past Medical History:  Diagnosis Date  . Abscess of left groin   . Acute blood loss  anemia 11/11/2013  . Asthma   . Boil of scrotum 11/21/2015  . Chest pain    a. 01/2015 Lexiscan MV: EF 28%, inferior, inferolateral, apical ischemia;  b. 01/2015 Cath: nl cors, PCWP 18 mmHg, CO 9.38 L/min, CI 3.53 L/min/m^2.  . Essential hypertension   . Hyperlipidemia   . Morbid obesity ([place])   . Nonischemic cardiomyopathy ([place])    a. 01/2015 Echo: EF 20-25%, diff HK, Gr 2 DD, Triv AI, mildly dil LA and Ao root.  . Type II diabetes mellitus ([place])    a. 01/2015 HbA1c = 8.9.    Review of Systems:   Negative except as noted in HPI  Physical Exam:  Vitals:   09/18/16 1402 09/18/16 1629  BP: (!) 97/59 104/66  Pulse: 68 68  Temp: 97.7 F (36.5 C)   TempSrc: Oral   SpO2: 100%   Weight: (!) 307 lb 6.4 oz (139.4 kg)   Height: 6\' 5"  (1.956 m)    Physical Exam  Constitutional: He is oriented to person, place, and time and well-developed, well-nourished, and in no distress. No distress.  HENT:  Head: Normocephalic and atraumatic.  Eyes: Conjunctivae are normal. Pupils are equal, round, and reactive to light.  Cardiovascular: Normal rate, regular rhythm and normal heart sounds.   Pulmonary/Chest: Effort normal and breath sounds normal.  Abdominal: Soft. Bowel sounds are normal. He exhibits no distension. There is no tenderness.  Musculoskeletal:  He exhibits no edema.  Neurological: He is alert and oriented to person, place, and time.  Skin: Skin is warm and dry. No rash noted. He is not diaphoretic. No erythema.  Psychiatric: Mood and affect normal.    Assessment & Plan:   See Encounters Tab for problem based charting.  Patient discussed with Dr. Evette Doffing

## 2016-09-18 NOTE — Patient Instructions (Signed)
Mr. Kollman,   I would like you to start taking 40 units of Lantus (long acting insulin) every night before bed. This will give you your baseline insulin dose for the next 24 hours. I would also like you to start taking Novolog 10 units three times a day with meals. This is a short acting insulin and will help [place] deal with the increased insulin when you eat a meal.   Please check your blood sugars 4 times a day, before breakfast, lunch and dinner and again before bed.   Please check your blood pressure at home. I would like you to hold the hydralazine until your blood pressures start going back into the 130-140s and then you can resume it.   I would like to see you back in 2 weeks for follow up.

## 2016-09-18 NOTE — Assessment & Plan Note (Deleted)
Was told by his Renal doctor that his blood sugar was >1000 this past Friday. Started on Lantus in October and stopped Metformin because of his renal function. Says he been taking Lantus 2-3 times a day when he feels that his sugar are running high. Says that he has been giving himself 10-20 units at time. Does not have his meter with him today. Says his sugars have been running in the 400s at home. CBG 242 in clinic today. No episodes of hypoglycemia. Did not feel different when his sugars were >1000 but after getting it down into the 400s he realized he was feeling tired and sluggish. Reports good PO intake. Polyuria and polydipsia present. Generalized weakness/malasie. Still on the glipizide 5 mg daily. A1C > 14 today.   Last couple of days does report orthostatic symptoms. BP has been in the 120/80s at home.

## 2016-09-19 NOTE — Progress Notes (Signed)
Internal Medicine Clinic Attending  Case discussed with Dr. Boswell at the time of the visit.  We reviewed the resident's history and exam and pertinent patient test results.  I agree with the assessment, diagnosis, and plan of care documented in the resident's note.  

## 2016-09-19 NOTE — Assessment & Plan Note (Addendum)
  Jeremy [name] was seen in clinic on 10/20 for his diabetes at a hospital follow up visit. He had been found to have an acute kidney injury with nephrotic syndrome. 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 With his reduced renal function he was taken off metformin. His glipizide 5 mg daily was continued at that time and he was started on Lantus 5 untis qhs. He was instructed to follow up in 2 weeks with his meter for recheck. He has not followed up in clinic until today.   Today, he reports he was seen by his nephrologist on 09/12/16 and was called after the visit that his blood sugars were >1000. Requested by nephrology that he follow up with his PCP. Since that time he reports he has been taking Lantus 2-3 times a day when he feels that his sugar are running high. Says that he has been giving himself 10-20 units at time. Did not bring his meter with him today. Says his sugars have been running in the 400s at home. CBG 242 in clinic today. No episodes of hypoglycemia. Did not feel different when his sugars were >1000 but after getting it down into the 400s he realized he was feeling tired and sluggish. Polyuria and polydipsia present. Still having generalized weakness/malasie but denies any other symptoms. No nausea, vomiting, diarrhea, blurry vision, headache. Reports good PO intake but wife reports he has not been eating or drinking much due to fear of his blood sugars jumping up again.   His blood pressure today is 97/59. He does report feeling lightheaded/dizzy with standing over the past couple of days. Does check his blood pressure at home and reports usually running 120/80s at home.   Last A1c was 7.2 in 04/2016; A1c > 14 today.   A/P Orthostatics negative in clinic, however, his BP was 75 systolic and felt symptomatic. Concern for possible dehydration with poor PO intake and polyuria.  Checked state BMET, no AG or signs of volume depletion. He responded well to  1L NS bolus with BP improvement to 104/66. Upon further questioning it appears he may have inadvertently taken an extra dose of his clonidine last night.   Will increase Lantus to 40 units qhs and start Novolog 10 units tid qac. Discussed the purpose of both insulins and instructed him to not take extra doses of Lantus to avoid insulin stacking. Asked him to check his CBGs qid and bring his meter with him to his next visit. Will have him follow up in 2 weeks for follow up.

## 2016-09-19 NOTE — Assessment & Plan Note (Addendum)
Patient on amlodipine 10mg  daily, carvedilol 25mg  BID, Clonidine 032mg  BID, hydralazine 100mg  BID  His blood pressure today is 97/59. He does report feeling lightheaded/dizzy with standing over the past couple of days. Does check his blood pressure at home and reports usually running 120/80s at home.   Orthostatics negative in clinic, however, his BP was 75 systolic and felt symptomatic. Concern for possible dehydration with poor PO intake and polyuria.   Checked state BMET, no AG or signs of volume depletion. He responded well to 1L NS bolus with BP improvement to 104/66. Upon further questioning it appears he may have inadvertently taken an extra dose of his clonidine last night.   Plan Will have patient hold his home dose hydralazine until BP has improved to the 130-140s and no longer symptomatic. Can resume home dose medications after that. Suspect his low BP today may be secondary to inadvertent extra dose of medications.   RTC in 2 weeks

## 2016-09-23 ENCOUNTER — Inpatient Hospital Stay (HOSPITAL_COMMUNITY): Admission: RE | Admit: 2016-09-23 | Payer: Self-pay | Source: Ambulatory Visit

## 2016-09-23 ENCOUNTER — Telehealth: Payer: Self-pay

## 2016-09-23 NOTE — Telephone Encounter (Signed)
Needs to speak with a nurse regarding abscess on the buttock.

## 2016-09-23 NOTE — Telephone Encounter (Signed)
Pt wanted abx called in, offered an appt for fri, he refused, wants abx called in, offered urg care, he hung ph up

## 2016-09-23 NOTE — Telephone Encounter (Signed)
Agree with Dr. Posey Pronto.  This is not an issue that can be addressed with empiric antibiotics.  It requires an evaluation as surgical intervention such as an I&D may be required depending upon size, location, etc.  If he decides to call back and is interested in an appointment I agree with scheduling the next available.  If he is ill he should report to the nearest ED.  Thanks.

## 2016-09-23 NOTE — Telephone Encounter (Signed)
Yes, he will need to be seen as we cannot evaluate this over the phone. Thank you.

## 2016-09-26 MED FILL — TACROLIMUS 1 MG CAPSULE: 1 | 15 days supply | Qty: 150 | Fill #2

## 2016-09-26 NOTE — Telephone Encounter (Signed)
I have tried to call pt, got vmail, lm for rtc

## 2016-09-30 MED FILL — CARVEDILOL 25 MG TABLET: 25 | 30 days supply | Qty: 60 | Fill #4

## 2016-09-30 MED FILL — hydrALAZINE HCL 100 MG TABS: 100 | 30 days supply | Qty: 90 | Fill #2

## 2016-10-02 NOTE — Telephone Encounter (Signed)
I have called multiple times, lm's for rtc, today pt answered ph and he states that the "place is still there and it busted" ask when "i dont know, last week" ask if it has closed "nah, it's still draining", I ask him if I could make him an appt to have it evaluated and he stated "nah", I cautioned him that with his medical problems it could become serious and it would be in his best interest to come in asap. He finely with resolve agreed to mon at 0815. I ask him to please keep appt, he said he would

## 2016-10-03 ENCOUNTER — Encounter (HOSPITAL_COMMUNITY)
Admission: RE | Admit: 2016-10-03 | Discharge: 2016-10-03 | Disposition: A | Discharge status: Home / Self Care | Payer: Medicaid Other | Source: Ambulatory Visit | Attending: Nephrology | Admitting: Nephrology

## 2016-10-03 DIAGNOSIS — Z79899 Other long term (current) drug therapy: Secondary | ICD-10-CM | POA: Insufficient documentation

## 2016-10-03 DIAGNOSIS — N184 Chronic kidney disease, stage 4 (severe): Secondary | ICD-10-CM | POA: Insufficient documentation

## 2016-10-03 DIAGNOSIS — Z5181 Encounter for therapeutic drug level monitoring: Secondary | ICD-10-CM | POA: Diagnosis not present

## 2016-10-03 DIAGNOSIS — D631 Anemia in chronic kidney disease: Secondary | ICD-10-CM | POA: Diagnosis not present

## 2016-10-03 LAB — IRON AND TIBC
IRON: 72 ug/dL (ref 45–182)
Saturation Ratios: 50 % — ABNORMAL HIGH (ref 17.9–39.5)
TIBC: 144 ug/dL — AB (ref 250–450)
UIBC: 72 ug/dL

## 2016-10-03 LAB — POCT HEMOGLOBIN-HEMACUE: HEMOGLOBIN: 10.8 g/dL — AB (ref 13.0–17.0)

## 2016-10-03 LAB — FERRITIN: Ferritin: 307 ng/mL (ref 24–336)

## 2016-10-03 MED ORDER — DARBEPOETIN ALFA 100 MCG/0.5ML IJ SOSY
100.0000 ug | PREFILLED_SYRINGE | INTRAMUSCULAR | Status: DC
  Administered 2016-10-03: 100 ug via SUBCUTANEOUS

## 2016-10-03 MED ORDER — CLONIDINE HCL 0.1 MG PO TABS: 0.1000 mg | ORAL_TABLET | Freq: Once | ORAL | Status: DC | PRN

## 2016-10-03 MED ORDER — DARBEPOETIN ALFA 100 MCG/0.5ML IJ SOSY
PREFILLED_SYRINGE | INTRAMUSCULAR | Status: AC
  Filled 2016-10-03: qty 0.5

## 2016-10-03 NOTE — Telephone Encounter (Signed)
Thank you for keeping me in the loop.

## 2016-10-06 ENCOUNTER — Ambulatory Visit: Payer: Medicaid Other

## 2016-10-07 MED FILL — cloNIDine HCL 0.3 MG TABS: 0.3 | 30 days supply | Qty: 60 | Fill #2

## 2016-10-09 ENCOUNTER — Ambulatory Visit (INDEPENDENT_AMBULATORY_CARE_PROVIDER_SITE_OTHER): Payer: Self-pay | Admitting: Internal Medicine

## 2016-10-09 ENCOUNTER — Encounter: Payer: Self-pay | Admitting: Internal Medicine

## 2016-10-09 DIAGNOSIS — L02215 Cutaneous abscess of perineum: Secondary | ICD-10-CM

## 2016-10-09 DIAGNOSIS — E1165 Type 2 diabetes mellitus with hyperglycemia: Secondary | ICD-10-CM

## 2016-10-09 DIAGNOSIS — Z79899 Other long term (current) drug therapy: Secondary | ICD-10-CM

## 2016-10-09 DIAGNOSIS — E1121 Type 2 diabetes mellitus with diabetic nephropathy: Secondary | ICD-10-CM

## 2016-10-09 DIAGNOSIS — Z794 Long term (current) use of insulin: Secondary | ICD-10-CM

## 2016-10-09 DIAGNOSIS — I10 Essential (primary) hypertension: Secondary | ICD-10-CM

## 2016-10-09 MED FILL — ATORVASTATIN 80 MG TABLET: 80 | 30 days supply | Qty: 30 | Fill #5

## 2016-10-09 MED FILL — KLOR-CON M20 TABLET: 20 | 30 days supply | Qty: 30 | Fill #2

## 2016-10-09 MED FILL — AMLODIPINE BESYLATE 10 MG T: 10 | 30 days supply | Qty: 30 | Fill #4

## 2016-10-09 NOTE — Patient Instructions (Signed)
Continue current medications as prescribed. Follow up in 2 months with your PCP for diabetes check. Please remember to bring your meter at that time.   If you develop fever, chills nausea or vomiting or feel your lesion is getting worse, please call the clinic.

## 2016-10-09 NOTE — Assessment & Plan Note (Signed)
Patient was recently seen on 1/11 and hypotensive with BP in the 59C systolic. This was attributed to poor po intake and taking an extra dose of clonidine. He was lightheaded at the time. BP improved and symptoms resolved after IVF bolus. Patient has been compliant with amlodipine 10 mg QD, Coreg 25 mg BID, Hydralazine 100 mg BID and Clonidine 0.3 mg BID. BP is normal today, he denies lightheadedness.   Plan: -Continue current regimen

## 2016-10-09 NOTE — Assessment & Plan Note (Signed)
Patient admits to an open draining gluteal abscess for the past 2 weeks. He states it's improving, getting smaller with less drainage. He did not see a physician for the abscess. He denies associated fever, chills, nausea or vomiting. On exam, lesion is 1 x 2 cm on the right gluteus near cleft, no fluctuance, no tenderness, no drainage, no surrounding erythema. Appears to be healing.   Plan: -Continue to monitor off of antibiotics

## 2016-10-09 NOTE — Progress Notes (Signed)
Medicine attending: Medical history, presenting problems, physical findings, and medications, reviewed with resident physician Dr Alexa Burns on the day of the patient visit and I concur with her evaluation and management plan. 

## 2016-10-09 NOTE — Assessment & Plan Note (Signed)
Patient reports compliance with Lantus 40 units QHS, Novolog 10 units before breakfast and dinner. He doesn't always eat lunch and therefore does not take his Novolog at lunchtime. He reports morning CBGs in the 120-140s. Before dinner, CBG is in the 220-230s. He did not bring his meter with him.   Plan: -Continue current regimen -Encouraged him to check more frequently and at different times of the day to have a better understanding of how he is controlled throughout the day -Follow up in 2 months with PCP

## 2016-10-09 NOTE — Progress Notes (Signed)
    CC: Follow up for HTN and T2DM  HPI: [name] is a 36 y.o. male with PMHx of HTN, T2DM, Combined CHF, NICM, Asthma, CKD IV, Obesity, and HLD who presents to the clinic for follow up for HTN and T2DM.  Patient was recently seen on 1/11 and hypotensive with BP in the 47M systolic. This was attributed to poor po intake and taking an extra dose of clonidine. He was lightheaded at the time. BP improved and symptoms resolved after IVF bolus. Patient has been compliant with amlodipine 10 mg QD, Coreg 25 mg BID, Hydralazine 100 mg BID and Clonidine 0.3 mg BID. BP is normal today, he denies lightheadedness.   Patient admits to an open draining gluteal abscess for the past 2 weeks. He states it's improving, getting smaller with less drainage. He did not see a physician for the abscess. He denies associated fever, chills, nausea or vomiting.   Past Medical History:  Diagnosis Date  . Abscess of left groin   . Acute blood loss anemia 11/11/2013  . Asthma   . Boil of scrotum 11/21/2015  . Chest pain    a. 01/2015 Lexiscan MV: EF 28%, inferior, inferolateral, apical ischemia;  b. 01/2015 Cath: nl cors, PCWP 18 mmHg, CO 9.38 L/min, CI 3.53 L/min/m^2.  . Essential hypertension   . Hyperlipidemia   . Morbid obesity ([place])   . Nonischemic cardiomyopathy ([place])    a. 01/2015 Echo: EF 20-25%, diff HK, Gr 2 DD, Triv AI, mildly dil LA and Ao root.  . Type II diabetes mellitus ([place])    a. 01/2015 HbA1c = 8.9.    Review of Systems: Please see pertinent ROS reviewed in HPI and problem based charting.   Physical Exam: Vitals:   10/09/16 1439  BP: 116/70  Pulse: 65  Temp: 98 F (36.7 C)  TempSrc: Oral  SpO2: 100%  Weight: (!) 326 lb 4.8 oz (148 kg)  Height: 6\' 5"  (1.956 m)   General: Vital signs reviewed.  Patient is obese, in no acute distress and cooperative with exam.  Head: Normocephalic and atraumatic. Cardiovascular: RRR, S1 normal, S2 normal, no murmurs, gallops, or  rubs. Pulmonary/Chest: Clear to auscultation bilaterally, no wheezes, rales, or rhonchi. Abdominal: Soft, non-tender, non-distended, BS +  GU: 1 x 2 cm wound on right gluteus near cleft. No drainage, tenderness or surrounding erythema.  Extremities: No lower extremity edema bilaterally Psychiatric: Normal mood and affect. speech and behavior is normal. Cognition and memory are normal.   Assessment & Plan:  See encounters tab for problem based medical decision making. Patient discussed with Dr. Beryle Beams.

## 2016-10-14 MED FILL — LANTUS 100 UNITS/ML VIAL: 100 | 28 days supply | Qty: 10 | Fill #3

## 2016-10-14 MED FILL — NovoLOG 100 UNIT/ML SOLN: 100 | 30 days supply | Qty: 10 | Fill #1

## 2016-10-16 ENCOUNTER — Encounter (HOSPITAL_COMMUNITY): Payer: Self-pay

## 2016-10-16 ENCOUNTER — Ambulatory Visit (HOSPITAL_COMMUNITY)
Admission: RE | Admit: 2016-10-16 | Discharge: 2016-10-16 | Disposition: A | Discharge status: Home / Self Care | Payer: Medicaid Other | Source: Ambulatory Visit | Attending: Cardiology | Admitting: Cardiology

## 2016-10-16 VITALS — BP 130/75 | HR 66 | Wt 324.5 lb

## 2016-10-16 DIAGNOSIS — Z7982 Long term (current) use of aspirin: Secondary | ICD-10-CM | POA: Insufficient documentation

## 2016-10-16 DIAGNOSIS — I429 Cardiomyopathy, unspecified: Secondary | ICD-10-CM | POA: Diagnosis not present

## 2016-10-16 DIAGNOSIS — Z794 Long term (current) use of insulin: Secondary | ICD-10-CM | POA: Insufficient documentation

## 2016-10-16 DIAGNOSIS — I10 Essential (primary) hypertension: Secondary | ICD-10-CM

## 2016-10-16 DIAGNOSIS — I130 Hypertensive heart and chronic kidney disease with heart failure and stage 1 through stage 4 chronic kidney disease, or unspecified chronic kidney disease: Secondary | ICD-10-CM | POA: Insufficient documentation

## 2016-10-16 DIAGNOSIS — E785 Hyperlipidemia, unspecified: Secondary | ICD-10-CM | POA: Diagnosis not present

## 2016-10-16 DIAGNOSIS — Z6838 Body mass index (BMI) 38.0-38.9, adult: Secondary | ICD-10-CM | POA: Insufficient documentation

## 2016-10-16 DIAGNOSIS — Z8249 Family history of ischemic heart disease and other diseases of the circulatory system: Secondary | ICD-10-CM | POA: Diagnosis not present

## 2016-10-16 DIAGNOSIS — J45909 Unspecified asthma, uncomplicated: Secondary | ICD-10-CM | POA: Diagnosis not present

## 2016-10-16 DIAGNOSIS — N189 Chronic kidney disease, unspecified: Secondary | ICD-10-CM | POA: Insufficient documentation

## 2016-10-16 DIAGNOSIS — R29818 Other symptoms and signs involving the nervous system: Secondary | ICD-10-CM

## 2016-10-16 DIAGNOSIS — D649 Anemia, unspecified: Secondary | ICD-10-CM | POA: Insufficient documentation

## 2016-10-16 DIAGNOSIS — E1122 Type 2 diabetes mellitus with diabetic chronic kidney disease: Secondary | ICD-10-CM | POA: Insufficient documentation

## 2016-10-16 DIAGNOSIS — I5042 Chronic combined systolic (congestive) and diastolic (congestive) heart failure: Secondary | ICD-10-CM | POA: Diagnosis present

## 2016-10-16 DIAGNOSIS — Z79899 Other long term (current) drug therapy: Secondary | ICD-10-CM | POA: Insufficient documentation

## 2016-10-16 DIAGNOSIS — G4730 Sleep apnea, unspecified: Secondary | ICD-10-CM

## 2016-10-16 LAB — BASIC METABOLIC PANEL
Anion gap: 9 (ref 5–15)
BUN: 33 mg/dL — AB (ref 6–20)
CHLORIDE: 104 mmol/L (ref 101–111)
CO2: 26 mmol/L (ref 22–32)
CREATININE: 1.99 mg/dL — AB (ref 0.61–1.24)
Calcium: 8.4 mg/dL — ABNORMAL LOW (ref 8.9–10.3)
GFR calc Af Amer: 48 mL/min — ABNORMAL LOW (ref >60)
GFR calc non Af Amer: 41 mL/min — ABNORMAL LOW (ref >60)
Glucose, Bld: 233 mg/dL — ABNORMAL HIGH (ref 65–99)
Potassium: 3.7 mmol/L (ref 3.5–5.1)
Sodium: 139 mmol/L (ref 135–145)

## 2016-10-16 NOTE — Progress Notes (Signed)
Patient ID: Jeremy [name], male   DOB: July 14, 1981, 36 y.o.   MRN: [id number]    Advanced Heart Failure Clinic Note   PCP: Dr. Isabella Bowens Primary cardiologist: Dr. Shirlee Latch  36 yo with history of nonischemic cardiomyopathy, HTN, and DM presents for cardiology followup.  In 4/16, patient was seen in the ER with edema and chest pain.  The only medication he was taking was metformin.  BP was elevated.  Echo showed EF 20-25%, and Cardiolite suggested ischemia.  He had RHC/LHC in 5/16.  This showed no coronary disease and mildly elevated filling pressures.    Admitted 9/21 through 06/03/16 with marked volume overload and hypertensive crisis. Diuresed with IV lasix and transitioned to lasix 160 mg twice daily. Due to worsening renal function, nephrology consulted. Work up consistent with nephrotic syndrome. 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  Discharge creatinine 3.42.  He has been following with renal. He has been getting iron infusions and feels like he has more energy.  EF improved to 55% on 9/17 echo.   BP is now controlled.  Creatinine has come down.  He is on tacrolimus.  Rare atypical chest pain.  No exertional dyspnea, lightheadedness, or orthopnea/PND.   Labs (6/16): LDL 308, HDL 46, TGs 284 Labs (7/16): K 3.5, creatinine 1.0 Labs 05/24/2015: K 3.9 Creatinine 1.43  Labs (3/17): creatinine 1.55, LDL 108, HDL 31 Labs (9/17): hemoglobin 8.3, K 4, BUN 40, creatinine 3.69, BNP 56 Labs (06/03/2016) : K 4.6 Creatinine 3.42  Labs (10/17): K 4.4, creatinine 3.44, hgb 9.4 Labs (1/18): K 4.9, creatinine 1.85  PMH: 1. Type II diabetes since 2006.  2. HTN since around 2010.   3. Asthma 4. Hyperlipidemia 5. Morbid obesity 6. Cardiomyopathy: Nonischemic.  Echo (5/16) with EF 20-25%, diffuse hypokinesis, grade II diastolic dysfunction.  Lexiscan Cardiolite (5/16) with EF 28%, inferior/inferolateral/apical ischemia.  RHC/LHC (5/16) with normal coronaries,  mean RA 11, PA 33/21 mean 28, mean PCWP 18, CI 2.53.  - Echo (4/17): EF 50%, moderate LVH, normal RV size and systolic function. Grade IDD  - Echo (9/17): EF 55%.  7. ABIs normal 8/16.  8. CKD: AKI in 9/17 with diagnosis of nephrotic syndrome. 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   SH: Lives with girlfriend, nonsmoker, no cocaine, occasional marijuana, no ETOH.  Works as Paediatric nurse.  FH: Father with CHF, died at 41.  Sister with CHF, PAD.   ROS: All systems reviewed and negative except as per HPI.    Current Outpatient Prescriptions  Medication Sig Dispense Refill  . amLODipine (NORVASC) 10 MG tablet Take 1 tablet (10 mg total) by mouth at bedtime. 30 tablet 6  . ASPIR-LOW 81 MG EC tablet TAKE 1 TABLET BY MOUTH DAILY. 30 tablet 11  . atorvastatin (LIPITOR) 80 MG tablet Take 1 tablet (80 mg total) by mouth daily. 90 tablet 2  . blood glucose meter kit and supplies KIT Dispense based on patient and insurance preference. Use up to four times daily as directed. (FOR ICD-9 250.00, 250.01). [facility]. 1 each 0  . carvedilol (COREG) 25 MG tablet Take 1 tablet (25 mg total) by mouth 2 (two) times daily with a meal. 60 tablet 6  . cloNIDine (CATAPRES) 0.3 MG tablet Take 1 tablet (0.3 mg total) by mouth 2 (two) times daily. 60 tablet 3  . furosemide (LASIX) 80 MG tablet Take 1 tablet (80 mg total) by mouth 2 (two) times daily. 60 tablet 6  .  glipiZIDE (GLUCOTROL) 5 MG tablet TAKE 1 TABLET BY MOUTH DAILY. 30 tablet PRN  . hydrALAZINE (APRESOLINE) 100 MG tablet Take 1 tablet (100 mg total) by mouth 3 (three) times daily. 90 tablet 6  . insulin aspart (NOVOLOG) 100 UNIT/ML injection Inject 10 Units into the skin 3 (three) times daily with meals. 10 mL 2  . insulin glargine (LANTUS) 100 UNIT/ML injection Inject 40 units into the skin at bedtime. (IM Program) 10 mL 3  . Needles & Syringes MISC Use 4 times daily with insulin injections 200 each 2  . potassium  chloride SA (KLOR-CON M20) 20 MEQ tablet Take 1 tablet (20 mEq total) by mouth daily. 90 tablet 3  . nitroGLYCERIN (NITROSTAT) 0.4 MG SL tablet Place 1 tablet (0.4 mg total) under the tongue every 5 (five) minutes as needed for chest pain. (Patient not taking: Reported on 07/15/2016) 25 tablet 3   No current facility-administered medications for this encounter.    BP 130/75   Pulse 66   Wt (!) 324 lb 8 oz (147.2 kg)   SpO2 100%   BMI 38.48 kg/m    Wt Readings from Last 3 Encounters:  10/16/16 (!) 324 lb 8 oz (147.2 kg)  10/09/16 (!) 326 lb 4.8 oz (148 kg)  09/18/16 (!) 307 lb 6.4 oz (139.4 kg)     General: NAD Neck: Thick, JVP flat, no thyromegaly or thyroid nodule noted. Wife present Lungs: Clear CV: Nondisplaced PMI.  Heart regular S1/S2, no S3/S4, no murmur. No carotid bruit.   Abdomen: Obese, Soft, NT, mildly distended, no HSM. No bruits or masses. +BS  Skin: Intact without lesions or rashes.  Neurologic: Alert and oriented x 3.  Psych: Normal affect. Extremities: No clubbing or cyanosis. No edema.  HEENT: Normal.   Assessment/Plan:  1. Chronic diastolic CHF: Nonischemic cardiomyopathy. ?from long-standing HTN. EF was 20-25% in 206 but ECHO 12/2015 had showed improvement to EF 50%, Grade 1 DD.  ECHO 05/2016 EF 55%. Today he does not appear volume overloaded.  - Continue Lasix 80 mg bid. BMET today.  2. Suspected OSA: Reschedule sleep study.   3.  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  LDL much better in 3/17, suspect related to compliance with atorvastatin.  - Continue atorvastatin 80 mg daily. 4. HTN: BP now controlled. - Continue current amlodipine, Coreg, clonidine, hydralazine.  5. 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 Follows with renal.  Creatinine has improved.  Getting tacrolimus.  6. Anemia: Getting iron infusions per renal.   Followup in 4 months.   Loralie Champagne 10/16/2016

## 2016-10-16 NOTE — Patient Instructions (Signed)
Labs today  Your physician has recommended that you have a sleep study. This test records several body functions during sleep, including: brain activity, eye movement, oxygen and carbon dioxide blood levels, heart rate and rhythm, breathing rate and rhythm, the flow of air through your mouth and nose, snoring, body muscle movements, and chest and belly movement.  Your physician recommends that you schedule a follow-up appointment in: 4 months

## 2016-10-17 ENCOUNTER — Telehealth: Payer: Self-pay

## 2016-10-17 MED ORDER — DOXYCYCLINE HYCLATE 100 MG PO TABS: 100.0000 mg | ORAL_TABLET | Freq: Two times a day (BID) | ORAL | 0 refills | Status: DC

## 2016-10-17 NOTE — Telephone Encounter (Signed)
Sent in doxycycline and I notified patient.

## 2016-10-17 NOTE — Telephone Encounter (Signed)
Pt calls and states area on buttocks is no better, denies fevers, states is painful, doesn't feel it is getting better, states he was told just to call and abx would be called in for him, please advise

## 2016-10-17 NOTE — Telephone Encounter (Signed)
Requesting antibiotic to be filled. Please call back.

## 2016-10-22 MED FILL — TACROLIMUS 1 MG CAPSULE: 1 | 15 days supply | Qty: 150 | Fill #3

## 2016-10-23 MED FILL — NovoLOG 100 UNIT/ML SOLN: 100 | 30 days supply | Qty: 10 | Fill #2

## 2016-10-29 MED FILL — CARVEDILOL 25 MG TABLET: 25 | 30 days supply | Qty: 60 | Fill #5

## 2016-10-29 MED FILL — hydrALAZINE HCL 100 MG TABS: 100 | 30 days supply | Qty: 90 | Fill #3

## 2016-10-29 MED FILL — FUROSEMIDE 80 MG TABLET: 80 | 30 days supply | Qty: 120 | Fill #3

## 2016-10-30 ENCOUNTER — Other Ambulatory Visit (HOSPITAL_COMMUNITY): Payer: Self-pay | Admitting: *Deleted

## 2016-10-31 ENCOUNTER — Encounter (HOSPITAL_COMMUNITY)
Admission: RE | Admit: 2016-10-31 | Discharge: 2016-10-31 | Disposition: A | Discharge status: Home / Self Care | Payer: Medicaid Other | Source: Ambulatory Visit | Attending: Nephrology | Admitting: Nephrology

## 2016-10-31 DIAGNOSIS — D631 Anemia in chronic kidney disease: Secondary | ICD-10-CM | POA: Insufficient documentation

## 2016-10-31 DIAGNOSIS — Z5181 Encounter for therapeutic drug level monitoring: Secondary | ICD-10-CM | POA: Diagnosis not present

## 2016-10-31 DIAGNOSIS — N184 Chronic kidney disease, stage 4 (severe): Secondary | ICD-10-CM | POA: Diagnosis present

## 2016-10-31 DIAGNOSIS — Z79899 Other long term (current) drug therapy: Secondary | ICD-10-CM | POA: Insufficient documentation

## 2016-10-31 LAB — IRON AND TIBC
Iron: 56 ug/dL (ref 45–182)
SATURATION RATIOS: 37 % (ref 17.9–39.5)
TIBC: 153 ug/dL — ABNORMAL LOW (ref 250–450)
UIBC: 97 ug/dL

## 2016-10-31 LAB — POCT HEMOGLOBIN-HEMACUE: Hemoglobin: 11.2 g/dL — ABNORMAL LOW (ref 13.0–17.0)

## 2016-10-31 LAB — FERRITIN: Ferritin: 208 ng/mL (ref 24–336)

## 2016-10-31 MED ORDER — DARBEPOETIN ALFA 100 MCG/0.5ML IJ SOSY
PREFILLED_SYRINGE | INTRAMUSCULAR | Status: DC
Start: 2016-10-31 — End: 2016-11-01
  Filled 2016-10-31: qty 0.5

## 2016-10-31 MED ORDER — DARBEPOETIN ALFA 100 MCG/0.5ML IJ SOSY
100.0000 ug | PREFILLED_SYRINGE | INTRAMUSCULAR | Status: DC
  Administered 2016-10-31: 100 ug via SUBCUTANEOUS

## 2016-11-04 MED FILL — ATORVASTATIN 80 MG TABLET: 80 | 30 days supply | Qty: 30 | Fill #6

## 2016-11-04 MED FILL — cloNIDine HCL 0.3 MG TABS: 0.3 | 30 days supply | Qty: 60 | Fill #3

## 2016-11-13 MED FILL — AMLODIPINE BESYLATE 10 MG T: 10 | 30 days supply | Qty: 30 | Fill #5

## 2016-11-13 MED FILL — TACROLIMUS 1 MG CAPSULE: 1 | 15 days supply | Qty: 150 | Fill #4

## 2016-11-19 ENCOUNTER — Other Ambulatory Visit (HOSPITAL_COMMUNITY): Payer: Self-pay | Admitting: Adult Health

## 2016-11-20 ENCOUNTER — Other Ambulatory Visit: Payer: Self-pay

## 2016-11-20 NOTE — Telephone Encounter (Signed)
Given his kidney disease, this medication has higher risk of hypoglycemia. I will therefore not refill. Per Dr. Shanna Cisco note 1/11, he was to f/u in 2 weeks, and he has not done so for his diabetes. I'd recommend he be seen if he has questions about his diabetes medications.

## 2016-11-20 NOTE — Telephone Encounter (Signed)
glipiZIDE (GLUCOTROL) 5 MG tablet, refill request @ outpatient pharmacy.

## 2016-11-21 ENCOUNTER — Ambulatory Visit (HOSPITAL_BASED_OUTPATIENT_CLINIC_OR_DEPARTMENT_OTHER): Payer: Self-pay | Attending: Cardiology

## 2016-11-21 ENCOUNTER — Telehealth: Payer: Self-pay

## 2016-11-21 ENCOUNTER — Other Ambulatory Visit: Payer: Self-pay | Admitting: *Deleted

## 2016-11-21 MED ORDER — GLIPIZIDE 5 MG PO TABS: 5.0000 mg | ORAL_TABLET | Freq: Every day | ORAL | 3 refills | Status: DC

## 2016-11-21 NOTE — Telephone Encounter (Signed)
Needs to speak with a nurse about meds.

## 2016-11-21 NOTE — Telephone Encounter (Signed)
Pt is calling back to speak with the nurse. Please call back.

## 2016-11-21 NOTE — Telephone Encounter (Signed)
Sent request for med to dr patel and attending since pt is out

## 2016-11-24 ENCOUNTER — Ambulatory Visit (INDEPENDENT_AMBULATORY_CARE_PROVIDER_SITE_OTHER): Payer: Self-pay | Admitting: Internal Medicine

## 2016-11-24 ENCOUNTER — Encounter: Payer: Self-pay | Admitting: Internal Medicine

## 2016-11-24 VITALS — BP 107/69 | HR 73 | Temp 98.2°F | Ht 77.0 in | Wt 311.8 lb

## 2016-11-24 DIAGNOSIS — L02215 Cutaneous abscess of perineum: Secondary | ICD-10-CM

## 2016-11-24 DIAGNOSIS — B9689 Other specified bacterial agents as the cause of diseases classified elsewhere: Secondary | ICD-10-CM

## 2016-11-24 DIAGNOSIS — L0231 Cutaneous abscess of buttock: Secondary | ICD-10-CM

## 2016-11-24 DIAGNOSIS — E118 Type 2 diabetes mellitus with unspecified complications: Secondary | ICD-10-CM

## 2016-11-24 DIAGNOSIS — E1121 Type 2 diabetes mellitus with diabetic nephropathy: Secondary | ICD-10-CM

## 2016-11-24 DIAGNOSIS — Z872 Personal history of diseases of the skin and subcutaneous tissue: Secondary | ICD-10-CM

## 2016-11-24 DIAGNOSIS — Z794 Long term (current) use of insulin: Secondary | ICD-10-CM

## 2016-11-24 MED ORDER — INSULIN GLARGINE 100 UNIT/ML ~~LOC~~ SOLN: SUBCUTANEOUS | 3 refills | Status: DC

## 2016-11-24 MED ORDER — DOXYCYCLINE HYCLATE 100 MG PO TABS: 100.0000 mg | ORAL_TABLET | Freq: Two times a day (BID) | ORAL | 0 refills | Status: DC

## 2016-11-24 MED ORDER — GLIPIZIDE 5 MG PO TABS: 5.0000 mg | ORAL_TABLET | Freq: Every day | ORAL | 3 refills | Status: DC

## 2016-11-24 MED ORDER — INSULIN ASPART 100 UNIT/ML ~~LOC~~ SOLN: 10.0000 [IU] | Freq: Three times a day (TID) | SUBCUTANEOUS | 2 refills | Status: DC

## 2016-11-24 MED FILL — DOXYCYCLINE HYCLATE 100 MG: 100 | 5 days supply | Qty: 10 | Fill #0

## 2016-11-24 MED FILL — glipiZIDE 5 MG TABS: 5 | 90 days supply | Qty: 90 | Fill #0

## 2016-11-24 NOTE — Progress Notes (Signed)
Internal Medicine Clinic Attending  Case discussed with Dr. Hulen Luster at the time of the visit.  We reviewed the resident's history and exam and pertinent patient test results.  I agree with the assessment, diagnosis, and plan of care documented in the resident's note. It is difficult to adjust insulin with CBG readings. Hopefully pt will bring meter next appt.

## 2016-11-24 NOTE — Assessment & Plan Note (Signed)
Assessment: Patient has history of recurrent abscesses. He has a left buttock abscess x 3 days that has increased in size and is tender. Denies fevers or drainage from abscess. He states that he tries warm compresses but always eventually needs to have his boils treated with abx or drained.   P: rx for doxy 100mg  BID x 5 days.

## 2016-11-24 NOTE — Progress Notes (Signed)
   CC: DM and left buttock boil  HPI:  Jeremy [name] is a 36 y.o. with past medical history as outlined below who presents to clinic for diabetes follow-up and a boil on his left buttock cheek. Please see problem list for further details of patient's medical issues.  Past Medical History:  Diagnosis Date  . Abscess of left groin   . Acute blood loss anemia 11/11/2013  . Asthma   . Boil of scrotum 11/21/2015  . Chest pain    a. 01/2015 Lexiscan MV: EF 28%, inferior, inferolateral, apical ischemia;  b. 01/2015 Cath: nl cors, PCWP 18 mmHg, CO 9.38 L/min, CI 3.53 L/min/m^2.  . Essential hypertension   . Hyperlipidemia   . Morbid obesity ([place])   . Nonischemic cardiomyopathy ([place])    a. 01/2015 Echo: EF 20-25%, diff HK, Gr 2 DD, Triv AI, mildly dil LA and Ao root.  . Type II diabetes mellitus ([place])    a. 01/2015 HbA1c = 8.9.    Review of Systems:  Denies any fevers. He's had a tender boil on his left buttock cheek for the past 2-3 days without any drainage from it. The boil on his right buttock cheek has resolved. Denies lightheadedness, diaphoresis, tingling.  Physical Exam:  Vitals:   11/24/16 1336  BP: 107/69  Pulse: 73  Temp: 98.2 F (36.8 C)  TempSrc: Oral  SpO2: 100%  Weight: (!) 311 lb 12.8 oz (141.4 kg)  Height: 6\' 5"  (1.956 m)   Physical Exam  Constitutional: appears well-developed and well-nourished. No distress.  HENT:  Head: Normocephalic and atraumatic.  Nose: Nose normal.  Neurological: alert and oriented to person, place, and time.  Skin: small 1cm in diameter boil with faint white head coming to the top on left buttock on inner gluteal fold that is negative for erythema or fluctuance.    Assessment & Plan:   See Encounters Tab for problem based charting.  Patient discussed with Dr. Lynnae January

## 2016-11-24 NOTE — Telephone Encounter (Signed)
I initially denied this medication request on 3/15: "Given his kidney disease, this medication has higher risk of hypoglycemia. I will therefore not refill. Per Dr. Shanna Cisco note 1/11, he was to f/u in 2 weeks, and he has not done so for his diabetes. I'd recommend he be seen if he has questions about his diabetes medications."  He was switched to insulin therapy and followed up again on 2/1 with Dr. Quay Burow.   I will try to reach him today about not taking this medication, but If you reach him first, please let him know that he is NOT to take this medication.

## 2016-11-24 NOTE — Telephone Encounter (Signed)
He was seen in clinic today, and I was unaware that he had some recovery of renal function from September 2017. Glipizide was refilled today by Dr. Hulen Luster.   I spoke to him by phone to review my reasoning, and he assured me he has not had CBG readings <100. He appreciated the caution and was planning to pick up the medication later today.

## 2016-11-24 NOTE — Assessment & Plan Note (Addendum)
Assessment: hgba1c last month was 11.2. He did not bring his glucometer with him today and denies any CBGs <70. He is out of his glipizide 5 mg with taper today. He is also requesting refills of NovoLog 10 units with meals and Lantus 40 units daily.  Plan: Refilled glipizide, NovoLog, and Lantus. Stressed importance of glucose control to help prevent recurrent boils. Follow-up in 2 months for A1c. Instructed to bring glucometer to this visit.

## 2016-11-24 NOTE — Patient Instructions (Signed)
Start taking doxycycline 100mg  twice a day for 5 days.   Bring your glucometer with you to your next visit in 2 months.

## 2016-11-26 MED FILL — CARVEDILOL 25 MG TABLET: 25 | 30 days supply | Qty: 60 | Fill #6

## 2016-11-26 MED FILL — hydrALAZINE HCL 100 MG TABS: 100 | 30 days supply | Qty: 90 | Fill #4

## 2016-11-26 MED FILL — FUROSEMIDE 80 MG TABLET: 80 | 30 days supply | Qty: 120 | Fill #4

## 2016-11-26 MED FILL — KLOR-CON M20 TABLET: 20 | 30 days supply | Qty: 30 | Fill #3

## 2016-11-28 ENCOUNTER — Encounter (HOSPITAL_COMMUNITY)
Admission: RE | Admit: 2016-11-28 | Discharge: 2016-11-28 | Disposition: A | Discharge status: Home / Self Care | Payer: Medicaid Other | Source: Ambulatory Visit | Attending: Nephrology | Admitting: Nephrology

## 2016-11-28 DIAGNOSIS — N184 Chronic kidney disease, stage 4 (severe): Secondary | ICD-10-CM | POA: Diagnosis not present

## 2016-11-28 DIAGNOSIS — Z5181 Encounter for therapeutic drug level monitoring: Secondary | ICD-10-CM | POA: Insufficient documentation

## 2016-11-28 DIAGNOSIS — D631 Anemia in chronic kidney disease: Secondary | ICD-10-CM | POA: Insufficient documentation

## 2016-11-28 DIAGNOSIS — Z79899 Other long term (current) drug therapy: Secondary | ICD-10-CM | POA: Insufficient documentation

## 2016-11-28 LAB — FERRITIN: Ferritin: 220 ng/mL (ref 24–336)

## 2016-11-28 LAB — POCT HEMOGLOBIN-HEMACUE: HEMOGLOBIN: 12.2 g/dL — AB (ref 13.0–17.0)

## 2016-11-28 LAB — IRON AND TIBC
Iron: 64 ug/dL (ref 45–182)
Saturation Ratios: 42 % — ABNORMAL HIGH (ref 17.9–39.5)
TIBC: 151 ug/dL — ABNORMAL LOW (ref 250–450)
UIBC: 87 ug/dL

## 2016-11-28 MED ORDER — DARBEPOETIN ALFA 100 MCG/0.5ML IJ SOSY
PREFILLED_SYRINGE | INTRAMUSCULAR | Status: AC
  Filled 2016-11-28: qty 0.5

## 2016-11-28 MED ORDER — DARBEPOETIN ALFA 100 MCG/0.5ML IJ SOSY: 100.0000 ug | PREFILLED_SYRINGE | INTRAMUSCULAR | Status: DC

## 2016-11-28 MED FILL — TACROLIMUS 1 MG CAPSULE: 1 | 15 days supply | Qty: 150 | Fill #5

## 2016-12-02 ENCOUNTER — Other Ambulatory Visit (HOSPITAL_COMMUNITY): Payer: Self-pay | Admitting: Internal Medicine

## 2016-12-02 MED FILL — ATORVASTATIN 80 MG TABLET: 80 | 30 days supply | Qty: 30 | Fill #7

## 2016-12-03 MED FILL — cloNIDine HCL 0.3 MG TABS: 0.3 | 30 days supply | Qty: 60 | Fill #0

## 2016-12-12 ENCOUNTER — Inpatient Hospital Stay (HOSPITAL_COMMUNITY): Admission: RE | Admit: 2016-12-12 | Payer: Self-pay | Source: Ambulatory Visit

## 2016-12-12 MED FILL — NovoLOG 100 UNIT/ML SOLN: 100 | 30 days supply | Qty: 10 | Fill #0

## 2016-12-12 MED FILL — LANTUS 100 UNITS/ML VIAL: 100 | 25 days supply | Qty: 10 | Fill #0

## 2016-12-16 MED FILL — TACROLIMUS 1 MG CAPSULE: 1 | 15 days supply | Qty: 150 | Fill #6

## 2016-12-19 ENCOUNTER — Encounter (HOSPITAL_COMMUNITY)
Admission: RE | Admit: 2016-12-19 | Discharge: 2016-12-19 | Disposition: A | Discharge status: Home / Self Care | Payer: Medicaid Other | Source: Ambulatory Visit | Attending: Nephrology | Admitting: Nephrology

## 2016-12-19 DIAGNOSIS — N184 Chronic kidney disease, stage 4 (severe): Secondary | ICD-10-CM | POA: Insufficient documentation

## 2016-12-19 DIAGNOSIS — Z79899 Other long term (current) drug therapy: Secondary | ICD-10-CM | POA: Insufficient documentation

## 2016-12-19 DIAGNOSIS — D631 Anemia in chronic kidney disease: Secondary | ICD-10-CM | POA: Diagnosis not present

## 2016-12-19 DIAGNOSIS — Z5181 Encounter for therapeutic drug level monitoring: Secondary | ICD-10-CM | POA: Insufficient documentation

## 2016-12-19 LAB — POCT HEMOGLOBIN-HEMACUE: Hemoglobin: 11.3 g/dL — ABNORMAL LOW (ref 13.0–17.0)

## 2016-12-19 MED ORDER — DARBEPOETIN ALFA 100 MCG/0.5ML IJ SOSY
PREFILLED_SYRINGE | INTRAMUSCULAR | Status: AC
  Filled 2016-12-19: qty 0.5

## 2016-12-19 MED ORDER — DARBEPOETIN ALFA 100 MCG/0.5ML IJ SOSY
100.0000 ug | PREFILLED_SYRINGE | INTRAMUSCULAR | Status: DC
  Administered 2016-12-19: 100 ug via SUBCUTANEOUS

## 2016-12-19 MED FILL — AMLODIPINE BESYLATE 10 MG T: 10 | 30 days supply | Qty: 30 | Fill #6

## 2016-12-25 MED FILL — hydrALAZINE HCL 100 MG TABS: 100 | 30 days supply | Qty: 90 | Fill #5

## 2016-12-25 MED FILL — POTASSIUM CL ER 20 MEQ TABL: 20 | 30 days supply | Qty: 30 | Fill #4

## 2016-12-25 MED FILL — FUROSEMIDE 80 MG TABLET: 80 | 30 days supply | Qty: 120 | Fill #5

## 2016-12-30 ENCOUNTER — Other Ambulatory Visit (HOSPITAL_COMMUNITY): Payer: Self-pay | Admitting: Student

## 2016-12-30 MED FILL — CARVEDILOL 25 MG TABLET: 25 | 30 days supply | Qty: 60 | Fill #0

## 2016-12-31 MED FILL — ATORVASTATIN 80 MG TABLET: 80 | 30 days supply | Qty: 30 | Fill #8

## 2016-12-31 MED FILL — cloNIDine HCL 0.3 MG TABS: 0.3 | 30 days supply | Qty: 60 | Fill #1

## 2017-01-02 MED FILL — TACROLIMUS 1 MG CAPSULE: 1 | 15 days supply | Qty: 150 | Fill #7

## 2017-01-16 ENCOUNTER — Encounter (HOSPITAL_COMMUNITY)
Admission: RE | Admit: 2017-01-16 | Discharge: 2017-01-16 | Disposition: A | Discharge status: Home / Self Care | Payer: Medicaid Other | Source: Ambulatory Visit | Attending: Nephrology | Admitting: Nephrology

## 2017-01-16 DIAGNOSIS — Z79899 Other long term (current) drug therapy: Secondary | ICD-10-CM | POA: Insufficient documentation

## 2017-01-16 DIAGNOSIS — N184 Chronic kidney disease, stage 4 (severe): Secondary | ICD-10-CM

## 2017-01-16 DIAGNOSIS — Z5181 Encounter for therapeutic drug level monitoring: Secondary | ICD-10-CM | POA: Insufficient documentation

## 2017-01-16 DIAGNOSIS — D631 Anemia in chronic kidney disease: Secondary | ICD-10-CM | POA: Insufficient documentation

## 2017-01-16 LAB — IRON AND TIBC
IRON: 65 ug/dL (ref 45–182)
SATURATION RATIOS: 37 % (ref 17.9–39.5)
TIBC: 175 ug/dL — AB (ref 250–450)
UIBC: 110 ug/dL

## 2017-01-16 LAB — FERRITIN: Ferritin: 210 ng/mL (ref 24–336)

## 2017-01-16 LAB — POCT HEMOGLOBIN-HEMACUE: Hemoglobin: 11.9 g/dL — ABNORMAL LOW (ref 13.0–17.0)

## 2017-01-16 MED ORDER — DARBEPOETIN ALFA 100 MCG/0.5ML IJ SOSY
100.0000 ug | PREFILLED_SYRINGE | INTRAMUSCULAR | Status: DC
  Administered 2017-01-16: 100 ug via SUBCUTANEOUS

## 2017-01-16 MED ORDER — DARBEPOETIN ALFA 100 MCG/0.5ML IJ SOSY
PREFILLED_SYRINGE | INTRAMUSCULAR | Status: AC
  Filled 2017-01-16: qty 0.5

## 2017-01-16 MED FILL — TACROLIMUS 1 MG CAPSULE: 1 | 15 days supply | Qty: 150 | Fill #8

## 2017-01-23 ENCOUNTER — Encounter: Payer: Self-pay | Admitting: Internal Medicine

## 2017-01-23 ENCOUNTER — Other Ambulatory Visit (HOSPITAL_COMMUNITY): Payer: Self-pay | Admitting: *Deleted

## 2017-01-23 ENCOUNTER — Other Ambulatory Visit (HOSPITAL_COMMUNITY): Payer: Self-pay | Admitting: Student

## 2017-01-23 DIAGNOSIS — I5042 Chronic combined systolic (congestive) and diastolic (congestive) heart failure: Secondary | ICD-10-CM

## 2017-01-23 DIAGNOSIS — E785 Hyperlipidemia, unspecified: Secondary | ICD-10-CM

## 2017-01-23 MED ORDER — ATORVASTATIN CALCIUM 80 MG PO TABS: 80.0000 mg | ORAL_TABLET | Freq: Every day | ORAL | 2 refills | Status: DC

## 2017-01-23 MED FILL — AMLODIPINE BESYLATE 10 MG T: 10 | 30 days supply | Qty: 30 | Fill #0

## 2017-01-23 MED FILL — POTASSIUM CL ER 20 MEQ TABL: 20 | 30 days supply | Qty: 30 | Fill #5

## 2017-01-23 MED FILL — ATORVASTATIN 80 MG TABLET: 80 | 90 days supply | Qty: 90 | Fill #0

## 2017-01-26 ENCOUNTER — Ambulatory Visit (INDEPENDENT_AMBULATORY_CARE_PROVIDER_SITE_OTHER): Payer: Self-pay | Admitting: Internal Medicine

## 2017-01-26 ENCOUNTER — Encounter: Payer: Self-pay | Admitting: Internal Medicine

## 2017-01-26 VITALS — BP 150/100 | HR 72 | Temp 97.6°F | Ht 77.0 in | Wt 310.1 lb

## 2017-01-26 DIAGNOSIS — E119 Type 2 diabetes mellitus without complications: Secondary | ICD-10-CM

## 2017-01-26 DIAGNOSIS — E1121 Type 2 diabetes mellitus with diabetic nephropathy: Secondary | ICD-10-CM

## 2017-01-26 DIAGNOSIS — Z794 Long term (current) use of insulin: Secondary | ICD-10-CM

## 2017-01-26 DIAGNOSIS — Z113 Encounter for screening for infections with a predominantly sexual mode of transmission: Secondary | ICD-10-CM

## 2017-01-26 DIAGNOSIS — Z202 Contact with and (suspected) exposure to infections with a predominantly sexual mode of transmission: Secondary | ICD-10-CM

## 2017-01-26 DIAGNOSIS — Z79899 Other long term (current) drug therapy: Secondary | ICD-10-CM

## 2017-01-26 DIAGNOSIS — I10 Essential (primary) hypertension: Secondary | ICD-10-CM

## 2017-01-26 MED ORDER — INSULIN GLARGINE 100 UNIT/ML ~~LOC~~ SOLN: SUBCUTANEOUS | 3 refills | Status: DC

## 2017-01-26 MED ORDER — INSULIN ASPART 100 UNIT/ML ~~LOC~~ SOLN: 10.0000 [IU] | Freq: Three times a day (TID) | SUBCUTANEOUS | 2 refills | Status: DC

## 2017-01-26 NOTE — Assessment & Plan Note (Signed)
Assessment He confirmed his home regimen: glargine 40 units at bedtime, aspart 10 units three times daily with meals, and glipizide 5 mg. His meter intermittently displays error messages between his numerical CBG values though he does not have it with him today for review. He denies any readings below 100.  Plan -Refill glargine and aspart -Ask Dr. Maudie Mercury about obtaining glucometer from outpatient pharmacy -Follow-up in 1 month to reassess diabetes

## 2017-01-26 NOTE — Assessment & Plan Note (Signed)
Assessment BP is elevated today at 150/100 which he acknowledges from just taking his medications prior to the visit. He confirms his home regiment with me: amlodipine 10 mg, carvedilol 25 mg twice daily, clonidine 0.3 mg twice daily, hydralazine 100 mg three times daily, furosemide 80 mg twice daily..  Plan -Continue current regimen

## 2017-01-26 NOTE — Progress Notes (Signed)
   CC: STI check  HPI:  [name] is a 36 y.o. who presents today for STI check. Please see assessment & plan for status of chronic medical problems.   Past Medical History:  Diagnosis Date  . Abscess of left groin   . Acute blood loss anemia 11/11/2013  . Asthma   . Boil of scrotum 11/21/2015  . Chest pain    a. 01/2015 Lexiscan MV: EF 28%, inferior, inferolateral, apical ischemia;  b. 01/2015 Cath: nl cors, PCWP 18 mmHg, CO 9.38 L/min, CI 3.53 L/min/m^2.  . Essential hypertension   . Hyperlipidemia   . Morbid obesity ([place])   . Nonischemic cardiomyopathy ([place])    a. 01/2015 Echo: EF 20-25%, diff HK, Gr 2 DD, Triv AI, mildly dil LA and Ao root.  . Type II diabetes mellitus ([place])    a. 01/2015 HbA1c = 8.9.    Review of Systems:  Please see each problem below for a pertinent review of systems.  Physical Exam:  Vitals:   01/26/17 1526  BP: (!) 150/100  Pulse: 72  Temp: 97.6 F (36.4 C)  TempSrc: Oral  SpO2: 100%  Weight: (!) 310 lb 1.6 oz (140.7 kg)  Height: 6\' 5"  (1.956 m)   Physical Exam  Constitutional: He is oriented to person, place, and time. No distress.  HENT:  Head: Normocephalic and atraumatic.  Eyes: Conjunctivae are normal. No scleral icterus.  Cardiovascular: Normal rate and regular rhythm.   Pulmonary/Chest: Effort normal. No respiratory distress.  Neurological: He is alert and oriented to person, place, and time.  Skin: He is not diaphoretic.   Assessment & Plan:   See Encounters Tab for problem based charting.  Patient discussed with Dr. Daryll Drown

## 2017-01-26 NOTE — Assessment & Plan Note (Addendum)
Assessment His significant other reports being diagnosed with trichomonas and asserts she is monogamous. He denies any new rashes, dysuria, urethral discharge and would like to resolve the discrepancy by getting checked for STI.  Plan -Check urine GC/chlamydia, trichomonas -Offered HIV and RPR though he declined in the interest of time  ADDENDUM 01/28/2017  11:41 AM:  STI check reassuring for no GC/Chlamydia and trichomonas. I attempted to call him twice and left a voicemail asking him to call us back. I will route this message to the triage RN in case I cannot answer in a timely manner.

## 2017-01-26 NOTE — Patient Instructions (Signed)
Please see me back in 1 month with your meter.  I will call you about the results of your testing and the meter.

## 2017-01-27 LAB — URINE CYTOLOGY ANCILLARY ONLY
Chlamydia: NEGATIVE
Neisseria Gonorrhea: NEGATIVE
Trichomonas: NEGATIVE

## 2017-01-27 MED ORDER — BLOOD GLUCOSE MONITOR KIT: PACK | 0 refills | Status: DC

## 2017-01-27 MED ORDER — BLOOD GLUCOSE MONITORING SUPPL DEVI: 1.0000 | Freq: Once | 0 refills | Status: AC

## 2017-01-27 NOTE — Progress Notes (Signed)
Internal Medicine Clinic Attending  Case discussed with Dr. Patel,Rushil at the time of the visit.  We reviewed the resident's history and exam and pertinent patient test results.  I agree with the assessment, diagnosis, and plan of care documented in the resident's note.  

## 2017-01-27 NOTE — Addendum Note (Signed)
Addended by: Gilles Chiquito B on: 01/27/2017 03:11 PM   Modules accepted: Level of Service

## 2017-01-27 NOTE — Addendum Note (Signed)
Addended by: Forde Dandy on: 01/27/2017 03:05 PM   Modules accepted: Orders

## 2017-01-27 NOTE — Addendum Note (Signed)
Addended by: Forde Dandy on: 01/27/2017 03:52 PM   Modules accepted: Orders

## 2017-01-28 ENCOUNTER — Other Ambulatory Visit (HOSPITAL_COMMUNITY): Payer: Self-pay | Admitting: Internal Medicine

## 2017-01-28 ENCOUNTER — Ambulatory Visit: Payer: Self-pay | Admitting: Pharmacist

## 2017-01-28 MED FILL — CARVEDILOL 25 MG TABLET: 25 | 30 days supply | Qty: 60 | Fill #1

## 2017-01-29 ENCOUNTER — Other Ambulatory Visit (HOSPITAL_COMMUNITY): Payer: Self-pay | Admitting: *Deleted

## 2017-01-29 MED ORDER — CLONIDINE HCL 0.3 MG PO TABS: 0.3000 mg | ORAL_TABLET | Freq: Two times a day (BID) | ORAL | 3 refills | Status: DC

## 2017-01-29 MED FILL — NovoLOG 100 UNIT/ML SOLN: 100 | 28 days supply | Qty: 10 | Fill #0

## 2017-01-29 MED FILL — LANTUS 100 UNITS/ML VIAL: 100 | 25 days supply | Qty: 10 | Fill #0

## 2017-01-29 MED FILL — cloNIDine HCL 0.3 MG TABS: 0.3 | 34 days supply | Qty: 68 | Fill #0

## 2017-01-30 ENCOUNTER — Encounter: Payer: Self-pay | Admitting: Nephrology

## 2017-02-03 MED FILL — hydrALAZINE HCL 100 MG TABS: 100 | 30 days supply | Qty: 90 | Fill #6

## 2017-02-03 MED FILL — TACROLIMUS 1 MG CAPSULE: 1 | 15 days supply | Qty: 150 | Fill #9

## 2017-02-09 ENCOUNTER — Encounter: Payer: Self-pay | Admitting: *Deleted

## 2017-02-12 ENCOUNTER — Ambulatory Visit (HOSPITAL_COMMUNITY)
Admission: RE | Admit: 2017-02-12 | Discharge: 2017-02-12 | Disposition: A | Discharge status: Home / Self Care | Payer: Medicaid Other | Source: Ambulatory Visit | Attending: Internal Medicine | Admitting: Internal Medicine

## 2017-02-12 VITALS — BP 124/78 | HR 76 | Wt 309.2 lb

## 2017-02-12 DIAGNOSIS — R29818 Other symptoms and signs involving the nervous system: Secondary | ICD-10-CM

## 2017-02-12 DIAGNOSIS — I429 Cardiomyopathy, unspecified: Secondary | ICD-10-CM | POA: Diagnosis not present

## 2017-02-12 DIAGNOSIS — I10 Essential (primary) hypertension: Secondary | ICD-10-CM

## 2017-02-12 DIAGNOSIS — E119 Type 2 diabetes mellitus without complications: Secondary | ICD-10-CM

## 2017-02-12 DIAGNOSIS — D649 Anemia, unspecified: Secondary | ICD-10-CM | POA: Diagnosis not present

## 2017-02-12 DIAGNOSIS — J45909 Unspecified asthma, uncomplicated: Secondary | ICD-10-CM | POA: Insufficient documentation

## 2017-02-12 DIAGNOSIS — N049 Nephrotic syndrome with unspecified morphologic changes: Secondary | ICD-10-CM | POA: Diagnosis not present

## 2017-02-12 DIAGNOSIS — E785 Hyperlipidemia, unspecified: Secondary | ICD-10-CM | POA: Insufficient documentation

## 2017-02-12 DIAGNOSIS — Z794 Long term (current) use of insulin: Secondary | ICD-10-CM | POA: Diagnosis not present

## 2017-02-12 DIAGNOSIS — E1122 Type 2 diabetes mellitus with diabetic chronic kidney disease: Secondary | ICD-10-CM | POA: Insufficient documentation

## 2017-02-12 DIAGNOSIS — N189 Chronic kidney disease, unspecified: Secondary | ICD-10-CM | POA: Diagnosis not present

## 2017-02-12 DIAGNOSIS — E669 Obesity, unspecified: Secondary | ICD-10-CM

## 2017-02-12 DIAGNOSIS — I5042 Chronic combined systolic (congestive) and diastolic (congestive) heart failure: Secondary | ICD-10-CM

## 2017-02-12 DIAGNOSIS — I130 Hypertensive heart and chronic kidney disease with heart failure and stage 1 through stage 4 chronic kidney disease, or unspecified chronic kidney disease: Secondary | ICD-10-CM | POA: Diagnosis not present

## 2017-02-12 DIAGNOSIS — I5032 Chronic diastolic (congestive) heart failure: Secondary | ICD-10-CM | POA: Insufficient documentation

## 2017-02-12 DIAGNOSIS — I428 Other cardiomyopathies: Secondary | ICD-10-CM

## 2017-02-12 LAB — BASIC METABOLIC PANEL
Anion gap: 6 (ref 5–15)
BUN: 35 mg/dL — ABNORMAL HIGH (ref 6–20)
CHLORIDE: 101 mmol/L (ref 101–111)
CO2: 26 mmol/L (ref 22–32)
Calcium: 8.1 mg/dL — ABNORMAL LOW (ref 8.9–10.3)
Creatinine, Ser: 2.32 mg/dL — ABNORMAL HIGH (ref 0.61–1.24)
GFR calc Af Amer: 40 mL/min — ABNORMAL LOW (ref >60)
GFR calc non Af Amer: 34 mL/min — ABNORMAL LOW (ref >60)
GLUCOSE: 537 mg/dL — AB (ref 65–99)
POTASSIUM: 4.4 mmol/L (ref 3.5–5.1)
Sodium: 133 mmol/L — ABNORMAL LOW (ref 135–145)

## 2017-02-12 MED FILL — FUROSEMIDE 80 MG TABLET: 80 | 34 days supply | Qty: 68 | Fill #0

## 2017-02-12 NOTE — Progress Notes (Signed)
Patient ID: Jeremy [name], male   DOB: Aug 19, 1981, 36 y.o.   MRN: [id number]    Advanced Heart Failure Clinic Note   PCP: Dr. Randell Patient Primary cardiologist: Dr. Aundra Dubin  36 yo with history of nonischemic cardiomyopathy, HTN, and DM presents for cardiology followup.  In 4/16, patient was seen in the ER with edema and chest pain.  The only medication he was taking was metformin.  BP was elevated.  Echo showed EF 20-25%, and Cardiolite suggested ischemia.  He had RHC/LHC in 5/16.  This showed no coronary disease and mildly elevated filling pressures.    Admitted 9/21 through 06/03/16 with marked volume overload and hypertensive crisis. Diuresed with IV lasix and transitioned to lasix 160 mg twice daily. Due to worsening renal function, nephrology consulted. Work up consistent with nephrotic syndrome. 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  Discharge creatinine 3.42.  He has been following with renal. He has been getting iron infusions and feels like he has more energy.  EF improved to 55% on 9/17 echo.   He presents today for HF follow up. He feels well overall, no SOB with walking into clinic. No SOB with stairs. Weight stable at home, 306-309 pounds. He is walking more for exercise, no SOB with that. Eating fast food 2 times a week, drinking more than 2L a day. Denies orthopnea, PND. Denies chest pain and palpitations.    Labs (6/16): LDL 308, HDL 46, TGs 284 Labs (7/16): K 3.5, creatinine 1.0 Labs 05/24/2015: K 3.9 Creatinine 1.43  Labs (3/17): creatinine 1.55, LDL 108, HDL 31 Labs (9/17): hemoglobin 8.3, K 4, BUN 40, creatinine 3.69, BNP 56 Labs (06/03/2016) : K 4.6 Creatinine 3.42  Labs (10/17): K 4.4, creatinine 3.44, hgb 9.4 Labs (1/18): K 4.9, creatinine 1.85  PMH: 1. Type II diabetes since 2006.  2. HTN since around 2010.   3. Asthma 4. Hyperlipidemia 5. Morbid obesity 6. Cardiomyopathy: Nonischemic.  Echo (5/16) with EF 20-25%, diffuse  hypokinesis, grade II diastolic dysfunction.  Lexiscan Cardiolite (5/16) with EF 28%, inferior/inferolateral/apical ischemia.  RHC/LHC (5/16) with normal coronaries, mean RA 11, PA 33/21 mean 28, mean PCWP 18, CI 2.53.  - Echo (4/17): EF 50%, moderate LVH, normal RV size and systolic function. Grade IDD  - Echo (9/17): EF 55%.  7. ABIs normal 8/16.  8. CKD: AKI in 9/17 with diagnosis of nephrotic syndrome. 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   SH: Lives with girlfriend, nonsmoker, no cocaine, occasional marijuana, no ETOH.  Works as Art gallery manager.  FH: Father with CHF, died at 90.  Sister with CHF, PAD.   ROS: All systems reviewed and negative except as per HPI.    Current Outpatient Prescriptions  Medication Sig Dispense Refill  . amLODipine (NORVASC) 10 MG tablet TAKE 1 TABLET BY MOUTH ONCE DAILY AT BEDTIME 30 tablet 6  . ASPIR-LOW 81 MG EC tablet TAKE 1 TABLET BY MOUTH DAILY. 30 tablet 11  . atorvastatin (LIPITOR) 80 MG tablet Take 1 tablet (80 mg total) by mouth daily. 90 tablet 2  . carvedilol (COREG) 25 MG tablet TAKE 1 TABLET BY MOUTH TWICE DAILY WITH MEALS 60 tablet 6  . cloNIDine (CATAPRES) 0.3 MG tablet Take 1 tablet (0.3 mg total) by mouth 2 (two) times daily. 60 tablet 3  . furosemide (LASIX) 80 MG tablet Take 1 tablet (80 mg total) by mouth 2 (two) times daily. 60 tablet 6  . glipiZIDE (GLUCOTROL) 5 MG tablet Take 1 tablet (  5 mg total) by mouth daily. 90 tablet 3  . hydrALAZINE (APRESOLINE) 100 MG tablet Take 1 tablet (100 mg total) by mouth 3 (three) times daily. 90 tablet 6  . insulin aspart (NOVOLOG) 100 UNIT/ML injection Inject 10 Units into the skin 3 (three) times daily with meals. 10 mL 2  . insulin glargine (LANTUS) 100 UNIT/ML injection Inject 40 units into the skin at bedtime. (IM Program) 12 mL 3  . Needles & Syringes MISC Use 4 times daily with insulin injections 200 each 2  . nitroGLYCERIN (NITROSTAT) 0.4 MG SL tablet Place 1  tablet (0.4 mg total) under the tongue every 5 (five) minutes as needed for chest pain. 25 tablet 3  . potassium chloride SA (KLOR-CON M20) 20 MEQ tablet Take 1 tablet (20 mEq total) by mouth daily. 90 tablet 3  . tacrolimus (PROGRAF) 1 MG capsule Take 1 mg by mouth 2 (two) times daily.  5   No current facility-administered medications for this encounter.    BP 124/78 (BP Location: Right Arm, Patient Position: Sitting, Cuff Size: Large)   Pulse 76   Wt (!) 309 lb 3.2 oz (140.3 kg)   SpO2 100%   BMI 36.67 kg/m    Wt Readings from Last 3 Encounters:  02/12/17 (!) 309 lb 3.2 oz (140.3 kg)  01/26/17 (!) 310 lb 1.6 oz (140.7 kg)  11/24/16 (!) 311 lb 12.8 oz (141.4 kg)     General: Male, NAD. Walked into clinic without difficulty.  Neck: Thick. No JVP no thyromegaly or thyroid nodule noted.  Lungs: Clear bilaterally. Normal effort.  CV: Nondisplaced PMI.  Regular rate and rhythm. S1/S2, no S3/S4, no murmur. No carotid bruit.   Abdomen: Obese, Soft, NT, mildly distended, no HSM. No bruits or masses. +bowel sounds.  Skin: Intact without lesions or rashes.  Neurologic: Alert & oriented x 3.   Psych: Normal affect. Extremities: No clubbing or cyanosis. No peripheral edema.  HEENT: Normal.   Assessment/Plan:  1. Chronic diastolic CHF: Nonischemic cardiomyopathy. ?from long-standing HTN. EF was 20-25% in 2006 but ECHO 12/2015 had showed improvement to EF 50%, Grade 1 DD.  ECHO 05/2016 EF 55%.  - NYHA II, volume status stable on exam.  - Continue Lasix 80mg  BID.  2. Suspected OSA:  - Does not have insurance, will get once he gets insurance.  3.  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  LDL much better in 3/17, suspect related to compliance with atorvastatin.  - Continue atoravastatin.  4. HTN:  - Well controlled on current regimen.  - Continue current regimen.  5. 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 Follows with renal.  Creatinine has  improved.  Getting tacrolimus.  - BMET today - Follows with renal  6. Anemia - stable 7. DM - Will check BMET today.   Follow up in 4 months with Dr. Aundra Dubin.   Arbutus Leas 02/12/2017

## 2017-02-12 NOTE — Progress Notes (Signed)
Advanced Heart Failure Medication Review by a Pharmacist  Does the patient  feel that his/her medications are working for him/her?  yes  Has the patient been experiencing any side effects to the medications prescribed?  no  Does the patient measure his/her own blood pressure or blood glucose at home?  yes   Does the patient have any problems obtaining medications due to transportation or finances?   yes  Understanding of regimen: good Understanding of indications: good Potential of compliance: good Patient understands to avoid NSAIDs. Patient understands to avoid decongestants.  Issues to address at subsequent visits: none.     Pharmacist comments: Mr. Sievers is a 36 y/o male who presents without a medication list or medication bottles but with good recollection of his medication regimen. He reports no current insurance coverage and that he relies on his girlfriend's income to pay for medications. He reports good compliance with his medications and reports no medication-related concerns or questions for me at this time.     Time with patient: 6 Preparation and documentation time: 2 Total time: [place] PharmD Candidate

## 2017-02-12 NOTE — Progress Notes (Signed)
CSW referred to assist patient with insurance and disability. Patient reports he has been out of work since last February due to illness and disability. Patient's family have been assisting with support but feeling stressed. CSW discussed Social Security and Medicaid options as well as [place] discount program to assist in the interim. Patient's medications are covered at the moment under the HF fund. Patient and his sister will follow up and return call to CSW as needed for further assistance. CSW provided card and support. Raquel Sarna, [place], [place]

## 2017-02-12 NOTE — Patient Instructions (Signed)
Labs today We will only contact you if something comes back abnormal or we need to make some changes. Otherwise no news is good news!   Your physician recommends that you schedule a follow-up appointment in: 4 months with Dr Aundra Dubin    Do the following things EVERYDAY: 1) Weigh yourself in the morning before breakfast. Write it down and keep it in a log. 2) Take your medicines as prescribed 3) Eat low salt foods-Limit salt (sodium) to 2000 mg per day.  4) Stay as active as you can everyday 5) Limit all fluids for the day to less than 2 liters

## 2017-02-13 ENCOUNTER — Encounter (HOSPITAL_COMMUNITY)
Admission: RE | Admit: 2017-02-13 | Discharge: 2017-02-13 | Disposition: A | Discharge status: Home / Self Care | Payer: Medicaid Other | Source: Ambulatory Visit | Attending: Nephrology | Admitting: Nephrology

## 2017-02-13 DIAGNOSIS — Z5181 Encounter for therapeutic drug level monitoring: Secondary | ICD-10-CM | POA: Diagnosis not present

## 2017-02-13 DIAGNOSIS — N184 Chronic kidney disease, stage 4 (severe): Secondary | ICD-10-CM | POA: Insufficient documentation

## 2017-02-13 DIAGNOSIS — Z79899 Other long term (current) drug therapy: Secondary | ICD-10-CM | POA: Diagnosis not present

## 2017-02-13 DIAGNOSIS — D631 Anemia in chronic kidney disease: Secondary | ICD-10-CM | POA: Diagnosis not present

## 2017-02-13 LAB — IRON AND TIBC
Iron: 61 ug/dL (ref 45–182)
Saturation Ratios: 37 % (ref 17.9–39.5)
TIBC: 164 ug/dL — AB (ref 250–450)
UIBC: 103 ug/dL

## 2017-02-13 LAB — POCT HEMOGLOBIN-HEMACUE: HEMOGLOBIN: 12.6 g/dL — AB (ref 13.0–17.0)

## 2017-02-13 LAB — FERRITIN: Ferritin: 212 ng/mL (ref 24–336)

## 2017-02-13 MED ORDER — DARBEPOETIN ALFA 100 MCG/0.5ML IJ SOSY: 100.0000 ug | PREFILLED_SYRINGE | INTRAMUSCULAR | Status: DC

## 2017-02-19 MED FILL — LANTUS 100 UNITS/ML VIAL: 100 | 25 days supply | Qty: 10 | Fill #1

## 2017-02-19 MED FILL — glipiZIDE 5 MG TABS: 5 | 90 days supply | Qty: 90 | Fill #1

## 2017-02-19 MED FILL — TACROLIMUS 1 MG CAPSULE: 1 | 15 days supply | Qty: 150 | Fill #10

## 2017-02-22 IMAGING — US US RENAL
1 series · 14 of 25 positions shown · non-contrast
Comparison: 01/11/2015

CLINICAL DATA: Acute renal insufficiency.

EXAM:
RENAL / URINARY TRACT ULTRASOUND COMPLETE

[Series 1: us renal · 0.30mm/px · 14 of 50 slices shown]
[im 1/50]
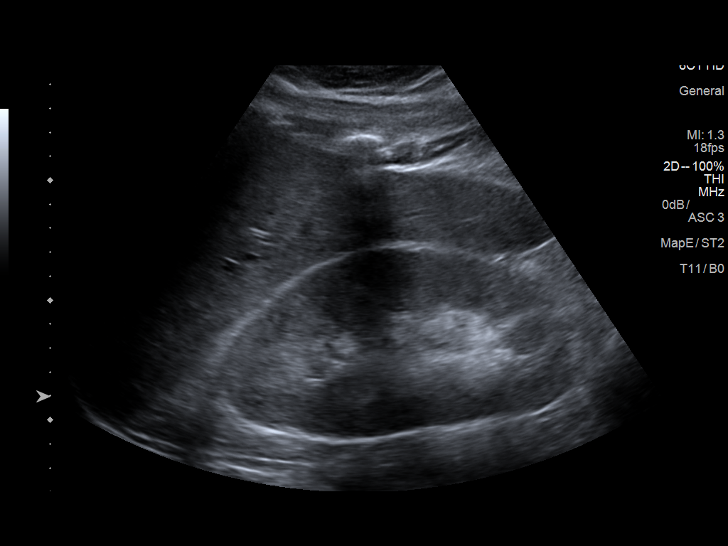
[im 5/50]
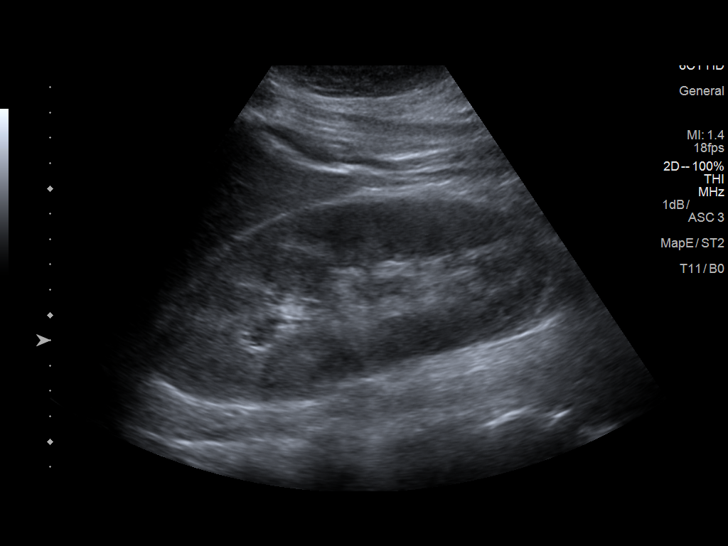
[im 9/50]
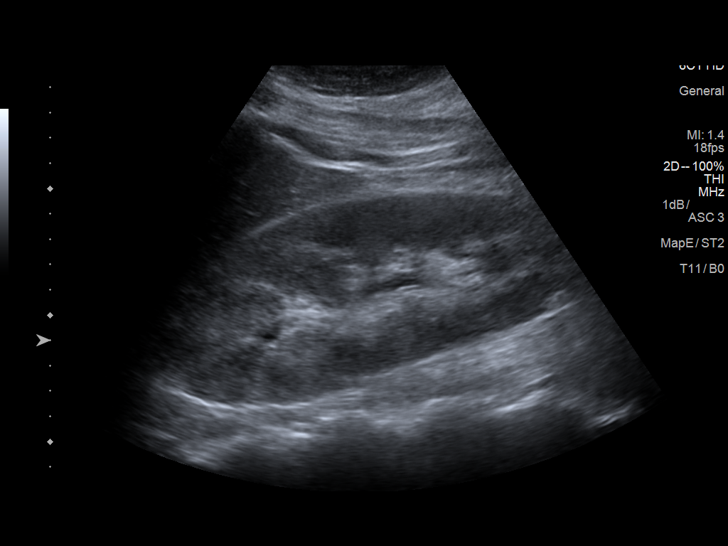
[im 13/50]
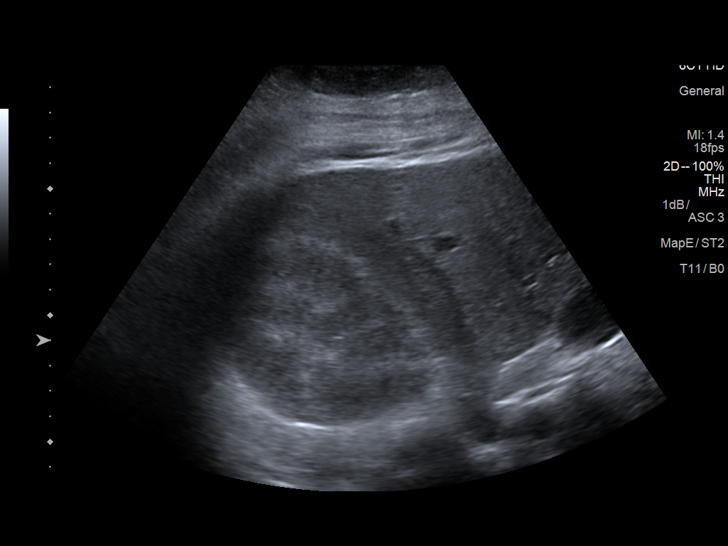
[im 17/50]
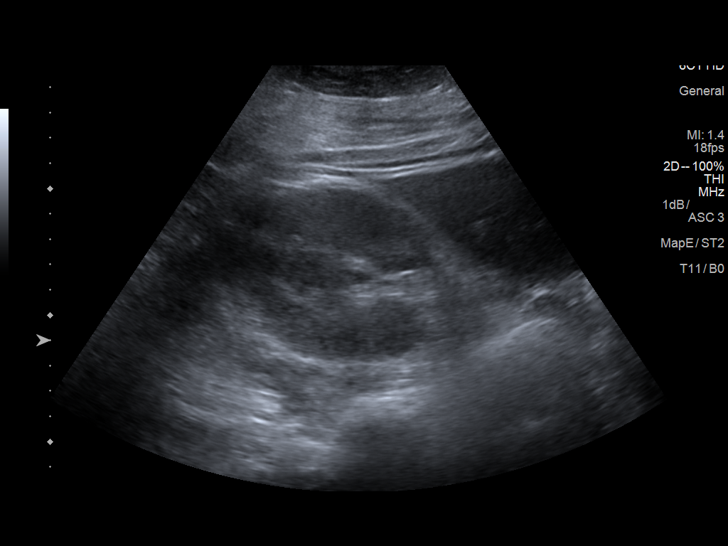
[im 19/50]
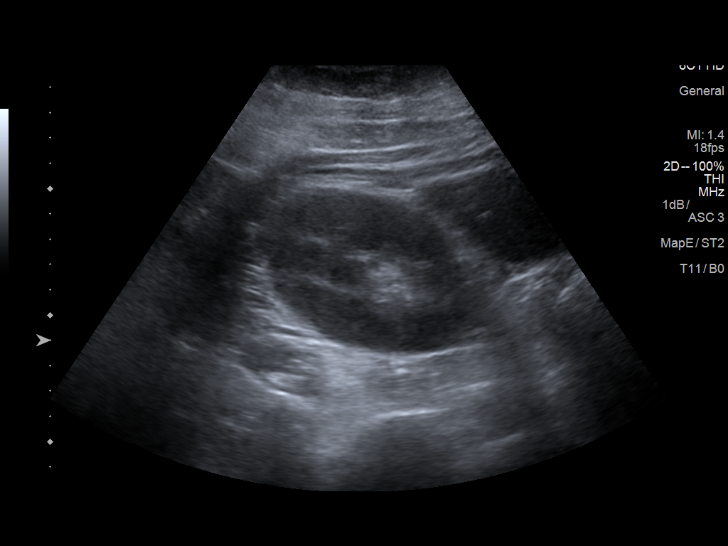
[im 23/50]
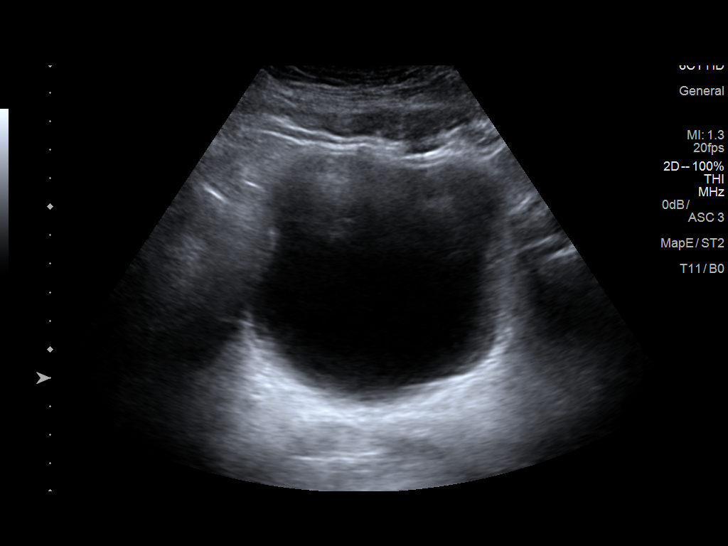
[im 27/50]
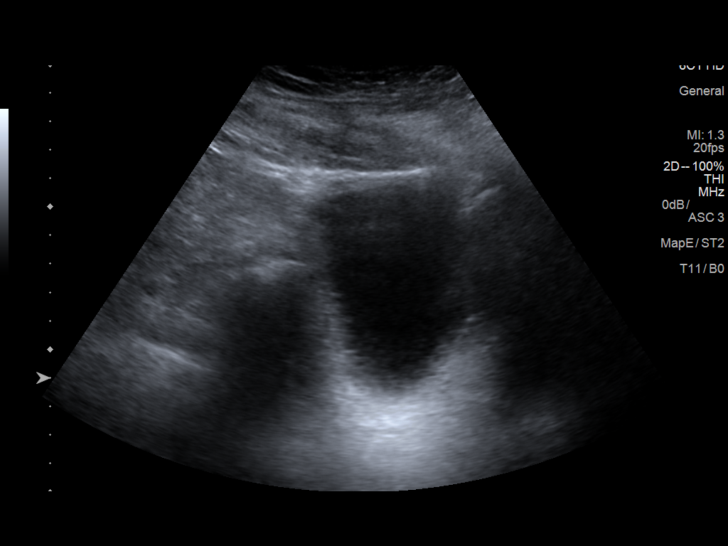
[im 31/50]
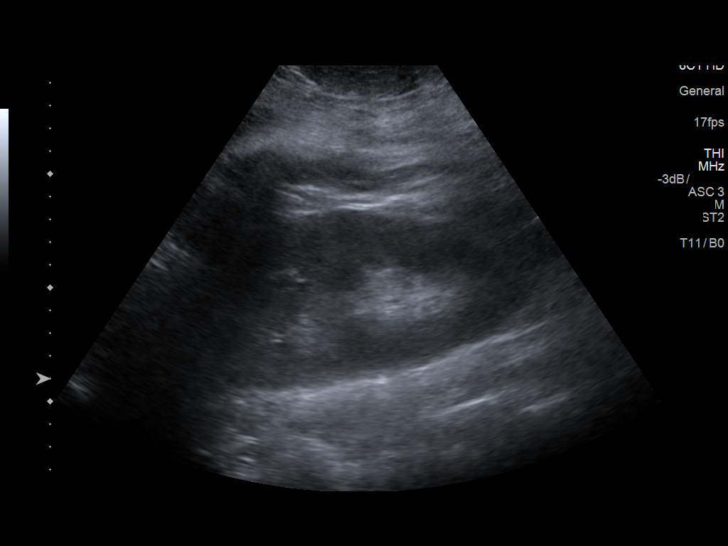
[im 33/50]
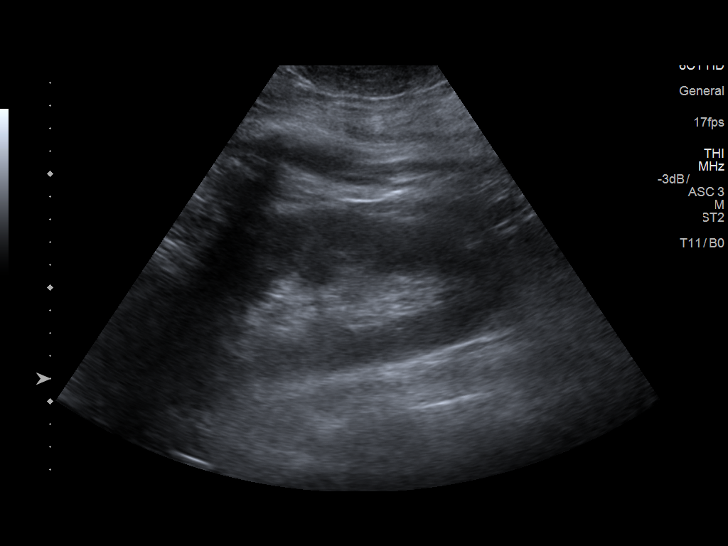
[im 37/50]
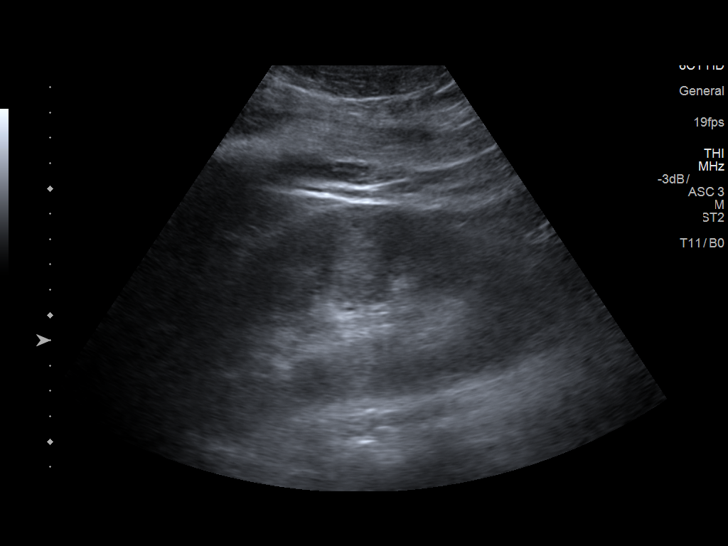
[im 41/50]
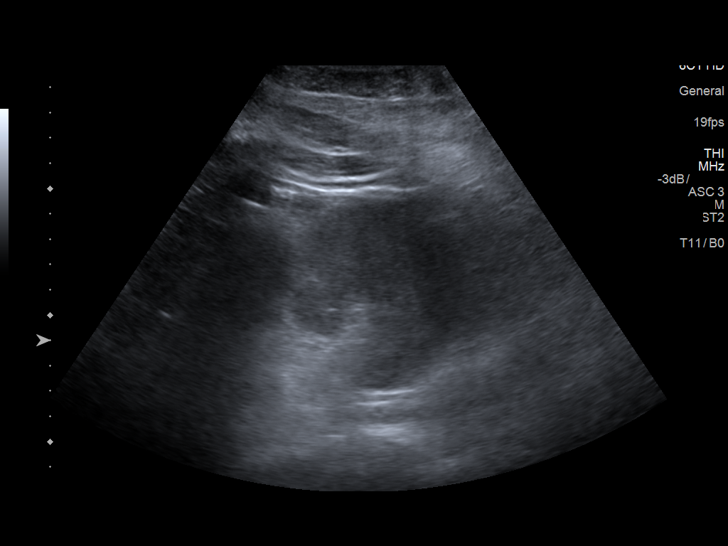
[im 45/50]
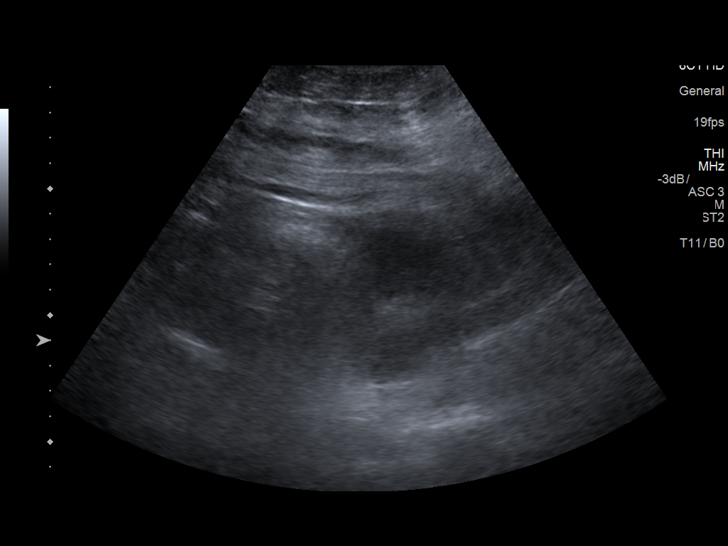
[im 50/50]
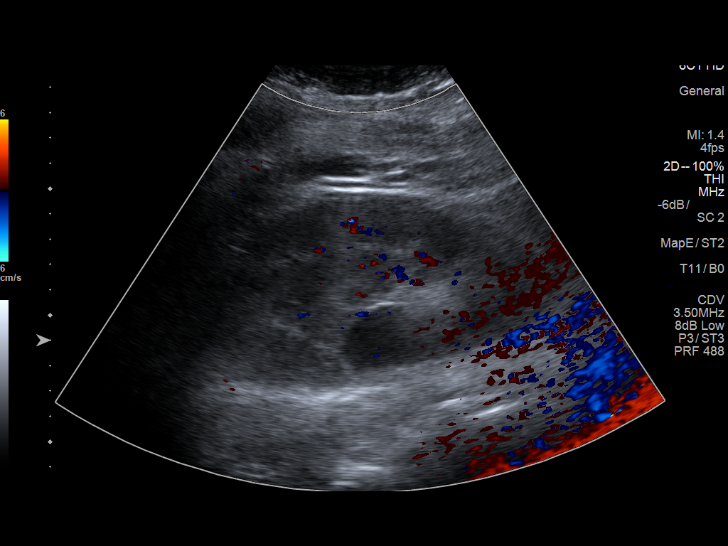

[14 of 25 positions shown; findings below may reference images not displayed]

FINDINGS: Right Kidney:

Length: 15.9 cm. Mildly increased and heterogeneous parenchymal
echotexture. No mass or hydronephrosis visualized.

Left Kidney:

Length: 16.8 cm. Mildly increased and heterogeneous parenchymal
echotexture. 2.9 cm simple appearing cyst seen in midpole. No mass
or hydronephrosis visualized.

Bladder:

Appears normal for degree of bladder distention.
IMPRESSION: Bilateral renal enlargement with mildly increased and heterogeneous
parenchymal echotexture, consistent with acute medical renal
disease. No evidence of hydronephrosis.

## 2017-02-23 MED FILL — CARVEDILOL 25 MG TABLET: 25 | 34 days supply | Qty: 68 | Fill #0

## 2017-02-23 MED FILL — NovoLOG 100 UNIT/ML SOLN: 100 | 28 days supply | Qty: 10 | Fill #1

## 2017-02-23 MED FILL — POTASSIUM CL ER 20 MEQ TABL: 20 | 34 days supply | Qty: 34 | Fill #0

## 2017-02-23 MED FILL — AMLODIPINE BESYLATE 10 MG T: 10 | 30 days supply | Qty: 30 | Fill #1

## 2017-02-27 ENCOUNTER — Encounter (HOSPITAL_COMMUNITY): Payer: Self-pay

## 2017-03-02 MED FILL — cloNIDine HCL 0.3 MG TABS: 0.3 | 34 days supply | Qty: 68 | Fill #1

## 2017-03-06 MED FILL — TACROLIMUS 1 MG CAPSULE: 1 | 15 days supply | Qty: 150 | Fill #11

## 2017-03-09 MED FILL — hydrALAZINE HCL 100 MG TABS: 100 | 34 days supply | Qty: 102 | Fill #0

## 2017-03-12 ENCOUNTER — Other Ambulatory Visit (HOSPITAL_COMMUNITY): Payer: Self-pay | Admitting: *Deleted

## 2017-03-13 ENCOUNTER — Encounter (HOSPITAL_COMMUNITY)
Admission: RE | Admit: 2017-03-13 | Discharge: 2017-03-13 | Disposition: A | Discharge status: Home / Self Care | Payer: Medicaid Other | Source: Ambulatory Visit | Attending: Nephrology | Admitting: Nephrology

## 2017-03-13 DIAGNOSIS — D631 Anemia in chronic kidney disease: Secondary | ICD-10-CM | POA: Diagnosis not present

## 2017-03-13 DIAGNOSIS — N184 Chronic kidney disease, stage 4 (severe): Secondary | ICD-10-CM | POA: Diagnosis present

## 2017-03-13 DIAGNOSIS — Z79899 Other long term (current) drug therapy: Secondary | ICD-10-CM | POA: Insufficient documentation

## 2017-03-13 DIAGNOSIS — Z5181 Encounter for therapeutic drug level monitoring: Secondary | ICD-10-CM | POA: Insufficient documentation

## 2017-03-13 LAB — IRON AND TIBC
Iron: 85 ug/dL (ref 45–182)
Saturation Ratios: 46 % — ABNORMAL HIGH (ref 17.9–39.5)
TIBC: 186 ug/dL — AB (ref 250–450)
UIBC: 101 ug/dL

## 2017-03-13 LAB — FERRITIN: Ferritin: 252 ng/mL (ref 24–336)

## 2017-03-13 LAB — POCT HEMOGLOBIN-HEMACUE: HEMOGLOBIN: 12.3 g/dL — AB (ref 13.0–17.0)

## 2017-03-13 MED ORDER — DARBEPOETIN ALFA 60 MCG/0.3ML IJ SOSY: 60.0000 ug | PREFILLED_SYRINGE | INTRAMUSCULAR | Status: DC

## 2017-03-13 MED FILL — FUROSEMIDE 80 MG TABLET: 80 | 34 days supply | Qty: 68 | Fill #1

## 2017-03-20 MED FILL — TACROLIMUS 1 MG CAPSULE: 1 | 15 days supply | Qty: 150 | Fill #0

## 2017-03-25 MED FILL — TACROLIMUS 1 MG CAPSULE: 1 | 30 days supply | Qty: 420 | Fill #0

## 2017-03-26 MED FILL — AMLODIPINE BESYLATE 10 MG T: 10 | 30 days supply | Qty: 30 | Fill #2

## 2017-03-30 MED FILL — POTASSIUM CL ER 20 MEQ TABL: 20 | 34 days supply | Qty: 34 | Fill #1

## 2017-03-30 MED FILL — CARVEDILOL 25 MG TABLET: 25 | 34 days supply | Qty: 68 | Fill #1

## 2017-04-02 MED FILL — cloNIDine HCL 0.3 MG TABS: 0.3 | 34 days supply | Qty: 68 | Fill #2

## 2017-04-07 MED FILL — NovoLOG 100 UNIT/ML SOLN: 100 | 28 days supply | Qty: 10 | Fill #2

## 2017-04-07 MED FILL — LANTUS 100 UNITS/ML VIAL: 100 | 25 days supply | Qty: 10 | Fill #2

## 2017-04-09 MED FILL — ATORVASTATIN 80 MG TABLET: 80 | 34 days supply | Qty: 34 | Fill #0

## 2017-04-10 ENCOUNTER — Encounter (HOSPITAL_COMMUNITY): Payer: Self-pay

## 2017-04-14 ENCOUNTER — Ambulatory Visit: Payer: Medicaid Other

## 2017-04-14 ENCOUNTER — Encounter: Payer: Self-pay | Admitting: Internal Medicine

## 2017-04-15 MED FILL — TACROLIMUS 1 MG CAPSULE: 1 | 15 days supply | Qty: 150 | Fill #1

## 2017-04-15 MED FILL — hydrALAZINE HCL 100 MG TABS: 100 | 34 days supply | Qty: 102 | Fill #1

## 2017-04-15 MED FILL — FUROSEMIDE 80 MG TABLET: 80 | 34 days supply | Qty: 68 | Fill #2

## 2017-04-20 ENCOUNTER — Encounter (HOSPITAL_COMMUNITY)
Admission: RE | Admit: 2017-04-20 | Discharge: 2017-04-20 | Disposition: A | Discharge status: Home / Self Care | Payer: Medicaid Other | Source: Ambulatory Visit | Attending: Nephrology | Admitting: Nephrology

## 2017-04-20 DIAGNOSIS — D631 Anemia in chronic kidney disease: Secondary | ICD-10-CM | POA: Diagnosis not present

## 2017-04-20 DIAGNOSIS — Z79899 Other long term (current) drug therapy: Secondary | ICD-10-CM | POA: Diagnosis not present

## 2017-04-20 DIAGNOSIS — N184 Chronic kidney disease, stage 4 (severe): Secondary | ICD-10-CM | POA: Diagnosis present

## 2017-04-20 DIAGNOSIS — Z5181 Encounter for therapeutic drug level monitoring: Secondary | ICD-10-CM | POA: Diagnosis not present

## 2017-04-20 LAB — FERRITIN: Ferritin: 241 ng/mL (ref 24–336)

## 2017-04-20 LAB — POCT HEMOGLOBIN-HEMACUE: HEMOGLOBIN: 12.1 g/dL — AB (ref 13.0–17.0)

## 2017-04-20 LAB — IRON AND TIBC
IRON: 82 ug/dL (ref 45–182)
SATURATION RATIOS: 44 % — AB (ref 17.9–39.5)
TIBC: 185 ug/dL — AB (ref 250–450)
UIBC: 103 ug/dL

## 2017-04-20 MED ORDER — DARBEPOETIN ALFA 60 MCG/0.3ML IJ SOSY: 60.0000 ug | PREFILLED_SYRINGE | INTRAMUSCULAR | Status: DC

## 2017-04-23 ENCOUNTER — Ambulatory Visit: Payer: Medicaid Other

## 2017-04-28 MED FILL — TACROLIMUS 1 MG CAPSULE: 1 | 30 days supply | Qty: 420 | Fill #1

## 2017-04-30 ENCOUNTER — Ambulatory Visit (INDEPENDENT_AMBULATORY_CARE_PROVIDER_SITE_OTHER): Payer: Medicaid Other | Admitting: Internal Medicine

## 2017-04-30 ENCOUNTER — Ambulatory Visit: Payer: Medicaid Other | Admitting: Dietician

## 2017-04-30 VITALS — BP 122/75 | HR 72 | Temp 98.2°F | Ht 77.0 in | Wt 299.2 lb

## 2017-04-30 DIAGNOSIS — E1121 Type 2 diabetes mellitus with diabetic nephropathy: Secondary | ICD-10-CM

## 2017-04-30 DIAGNOSIS — E1122 Type 2 diabetes mellitus with diabetic chronic kidney disease: Secondary | ICD-10-CM | POA: Diagnosis present

## 2017-04-30 DIAGNOSIS — Z794 Long term (current) use of insulin: Secondary | ICD-10-CM

## 2017-04-30 DIAGNOSIS — N184 Chronic kidney disease, stage 4 (severe): Secondary | ICD-10-CM | POA: Diagnosis not present

## 2017-04-30 DIAGNOSIS — Z9119 Patient's noncompliance with other medical treatment and regimen: Secondary | ICD-10-CM

## 2017-04-30 LAB — GLUCOSE, CAPILLARY: Glucose-Capillary: 326 mg/dL — ABNORMAL HIGH (ref 65–99)

## 2017-04-30 LAB — POCT GLYCOSYLATED HEMOGLOBIN (HGB A1C): HEMOGLOBIN A1C: 13.7

## 2017-04-30 MED ORDER — ACCU-CHEK FASTCLIX LANCETS MISC: 12 refills | Status: DC

## 2017-04-30 MED ORDER — GLUCOSE BLOOD VI STRP: ORAL_STRIP | 12 refills | Status: DC

## 2017-04-30 MED FILL — ACCU-CHEK GUIDE TEST STRIP: 25 days supply | Qty: 100 | Fill #0

## 2017-04-30 MED FILL — ACCU-CHEK FASTCLIX LANCETS: 26 days supply | Qty: 102 | Fill #0

## 2017-04-30 MED FILL — AMLODIPINE BESYLATE 10 MG T: 10 | 30 days supply | Qty: 30 | Fill #3

## 2017-04-30 MED FILL — cloNIDine HCL 0.3 MG TABS: 0.3 | 30 days supply | Qty: 60 | Fill #2

## 2017-04-30 MED FILL — CARVEDILOL 25 MG TABLET: 25 | 34 days supply | Qty: 68 | Fill #2

## 2017-04-30 MED FILL — POTASSIUM CL ER 20 MEQ TABL: 20 | 34 days supply | Qty: 34 | Fill #2

## 2017-04-30 NOTE — Assessment & Plan Note (Signed)
Patient presenting for diabetes follow-up. States his glucose meter broke 1 week ago and he has not been able to take his BG at home. Reports his highest BG the week prior was 220 and his lowest was 110. Today his CBG is 326 and his A1c is 13.7 from >14 on 09/2016. He is currently on Lantus 40 units QHS, NovoLog 10 units TID, and glipizide 5 mg daily. Reports compliance with medications. On further question, patient reports noncompliance with low carbohydrate diet. Denies polydipsia, polyuria, and hypoglycemic symptoms.   - No changes in insulin or glipizide dose at this time  - Provided patient with new blood glucose meter, lancets, and strips. Patient instructed to come back in 2 weeks and bring his meter with him to review BG. - Patient educated on low carbohydrate diet and provided diet information as well - Referral to nutrition and diabetes services with Butch Penny

## 2017-04-30 NOTE — Progress Notes (Signed)
Diabetes Self-Management Education  Visit Type: First/Initial  Appt. Start Time: 1515 Appt. End Time: 4360  04/30/2017  [name], identified by name and date of birth, is a 36 y.o. male with a diagnosis of Diabetes: Type 2.   ASSESSMENT  There were no vitals taken for this visit. There is no height or weight on file to calculate BMI.      Diabetes Self-Management Education - 04/30/17 1700      Visit Information   Visit Type First/Initial     Initial Visit   Diabetes Type Type 2   Date Diagnosed --  2004     Psychosocial Assessment   Self-management support Doctor's office;Family;CDE visits   Other persons present Spouse/SO   Patient Concerns Glycemic Control     Pre-Education Assessment   Patient understands monitoring blood glucose, interpreting and using results Needs Instruction      Individualized Plan for Diabetes Self-Management Training:   Learning Objective:  Patient will have a greater understanding of diabetes self-management. Patient education plan is to attend individual and/or group sessions per assessed needs and concerns.   Plan:   There are no Patient Instructions on file for this visit.  Expected Outcomes:   Unsure at this time  Education material provided: meter instructions and meter   If problems or questions, patient to contact team via:  Phone  Future DSME appointment:  suggest 2 weeks Jeremy [name], Jeremy [name], [place] 04/30/2017 5:25 PM.

## 2017-04-30 NOTE — Patient Instructions (Addendum)
It was nice to meet you today, Jeremy [name].  We gave you and you should blood sugar meter today. Please take your blood sugar 4 times a day: 3 times before eating and 1 time at night. I have sent lancets and strips to the [facility].  I will give you a call if your blood test results are not normal.  Please return in 2 weeks for follow-up of your diabetes.    Diabetes Mellitus and Food It is important for you to manage your blood sugar (glucose) level. Your blood glucose level can be greatly affected by what you eat. Eating healthier foods in the appropriate amounts throughout the day at about the same time each day will help you control your blood glucose level. It can also help slow or prevent worsening of your diabetes mellitus. Healthy eating may even help you improve the level of your blood pressure and reach or maintain a healthy weight. General recommendations for healthful eating and cooking habits include:  Eating meals and snacks regularly. Avoid going long periods of time without eating to lose weight.  Eating a diet that consists mainly of plant-based foods, such as fruits, vegetables, nuts, legumes, and whole grains.  Using low-heat cooking methods, such as baking, instead of high-heat cooking methods, such as deep frying.  Work with your dietitian to make sure you understand how to use the Nutrition Facts information on food labels. How can food affect me? Carbohydrates Carbohydrates affect your blood glucose level more than any other type of food. Your dietitian will help you determine how many carbohydrates to eat at each meal and teach you how to count carbohydrates. Counting carbohydrates is important to keep your blood glucose at a healthy level, especially if you are using insulin or taking certain medicines for diabetes mellitus. Alcohol Alcohol can cause sudden decreases in blood glucose (hypoglycemia), especially if you use insulin or take certain medicines for  diabetes mellitus. Hypoglycemia can be a life-threatening condition. Symptoms of hypoglycemia (sleepiness, dizziness, and disorientation) are similar to symptoms of having too much alcohol. If your health care provider has given you approval to drink alcohol, do so in moderation and use the following guidelines:  Women should not have more than one drink per day, and men should not have more than two drinks per day. One drink is equal to: ? 12 oz of beer. ? 5 oz of wine. ? 1 oz of hard liquor.  Do not drink on an empty stomach.  Keep yourself hydrated. Have water, diet soda, or unsweetened iced tea.  Regular soda, juice, and other mixers might contain a lot of carbohydrates and should be counted.  What foods are not recommended? As you make food choices, it is important to remember that all foods are not the same. Some foods have fewer nutrients per serving than other foods, even though they might have the same number of calories or carbohydrates. It is difficult to get your body what it needs when you eat foods with fewer nutrients. Examples of foods that you should avoid that are high in calories and carbohydrates but low in nutrients include:  Trans fats (most processed foods list trans fats on the Nutrition Facts label).  Regular soda.  Juice.  Candy.  Sweets, such as cake, pie, doughnuts, and cookies.  Fried foods.  What foods can I eat? Eat nutrient-rich foods, which will nourish your body and keep you healthy. The food you should eat also will depend on several factors,  including:  The calories you need.  The medicines you take.  Your weight.  Your blood glucose level.  Your blood pressure level.  Your cholesterol level.  You should eat a variety of foods, including:  Protein. ? Lean cuts of meat. ? Proteins low in saturated fats, such as fish, egg whites, and beans. Avoid processed meats.  Fruits and vegetables. ? Fruits and vegetables that may help control  blood glucose levels, such as apples, mangoes, and yams.  Dairy products. ? Choose fat-free or low-fat dairy products, such as milk, yogurt, and cheese.  Grains, bread, pasta, and rice. ? Choose whole grain products, such as multigrain bread, whole oats, and brown rice. These foods may help control blood pressure.  Fats. ? Foods containing healthful fats, such as nuts, avocado, olive oil, canola oil, and fish.  Does everyone with diabetes mellitus have the same meal plan? Because every person with diabetes mellitus is different, there is not one meal plan that works for everyone. It is very important that you meet with a dietitian who will help you create a meal plan that is just right for you. This information is not intended to replace advice given to you by your health care provider. Make sure you discuss any questions you have with your health care provider. Document Released: 05/22/2005 Document Revised: 01/31/2016 Document Reviewed: 07/22/2013 Elsevier Interactive Patient Education  2017 [employer].

## 2017-04-30 NOTE — Assessment & Plan Note (Signed)
Follows up with Dr. Lorrene Reid. Has an appt with her next month. Cr 2.32 on 02/2017 from 1.99 on 10/2016.   - BMP

## 2017-04-30 NOTE — Progress Notes (Signed)
   CC: Diabetes follow-up  HPI:  [name] is a 36 y.o. male with PMH listed below who presents to clinic for diabetes follow-up. Please see problem based assessment and plan for further details.  Past Medical History:  Diagnosis Date  . Abscess of left groin   . Acute blood loss anemia 11/11/2013  . Anemia in chronic kidney disease (CKD)   . Asthma   . Boil of scrotum 11/21/2015  . Chest pain    a. 01/2015 Lexiscan MV: EF 28%, inferior, inferolateral, apical ischemia;  b. 01/2015 Cath: nl cors, PCWP 18 mmHg, CO 9.38 L/min, CI 3.53 L/min/m^2.  . Essential hypertension   . Hyperlipidemia   . Membranous glomerulonephritis   . Morbid obesity ([place])   . Nonischemic cardiomyopathy ([place])    a. 01/2015 Echo: EF 20-25%, diff HK, Gr 2 DD, Triv AI, mildly dil LA and Ao root.  . Type II diabetes mellitus ([place])    a. 01/2015 HbA1c = 8.9.   Review of Systems:   Review of Systems  Constitutional: Negative for chills, fever and weight loss.  Respiratory: Negative for cough and shortness of breath.   Cardiovascular: Negative for chest pain, palpitations and leg swelling.  Gastrointestinal: Negative for abdominal pain, constipation, diarrhea, nausea and vomiting.  Genitourinary: Negative for dysuria, frequency and urgency.    Physical Exam:  Vitals:   04/30/17 1506  BP: 122/75  Pulse: 72  Temp: 98.2 F (36.8 C)  TempSrc: Oral  SpO2: 100%  Weight: 299 lb 3.2 oz (135.7 kg)  Height: 6\' 5"  (1.956 m)   General: Patient is distracted during the interview, in no acute distress HENT: NCAT, neck supple and FROM, MMM, OP clear without exudates or erythema  Cardiac: regular rate and rhythm, nl S1/S2, no murmurs, rubs or gallops  Pulm: CTAB, no wheezes or crackles, no increased work of breathing  Abd: soft, NTND, bowel sounds present Ext: warm and well perfused, no peripheral edema  Psych: Inattentive, flat affect, distracted during the interview    Assessment & Plan:   See Encounters  Tab for problem based charting.  Patient seen with Dr. Dareen Piano

## 2017-05-01 LAB — BMP8+ANION GAP
Anion Gap: 14 mmol/L (ref 10.0–18.0)
BUN / CREAT RATIO: 18 (ref 9–20)
BUN: 42 mg/dL — AB (ref 6–20)
CHLORIDE: 102 mmol/L (ref 96–106)
CO2: 21 mmol/L (ref 20–29)
Calcium: 8.7 mg/dL (ref 8.7–10.2)
Creatinine, Ser: 2.38 mg/dL — ABNORMAL HIGH (ref 0.76–1.27)
GFR calc non Af Amer: 34 mL/min/{1.73_m2} — ABNORMAL LOW (ref >59)
GFR, EST AFRICAN AMERICAN: 39 mL/min/{1.73_m2} — AB (ref >59)
Glucose: 338 mg/dL — ABNORMAL HIGH (ref 65–99)
Potassium: 5 mmol/L (ref 3.5–5.2)
Sodium: 137 mmol/L (ref 134–144)

## 2017-05-04 ENCOUNTER — Encounter (HOSPITAL_COMMUNITY): Payer: Medicaid Other

## 2017-05-04 NOTE — Progress Notes (Signed)
Internal Medicine Clinic Attending  I saw and evaluated the patient.  I personally confirmed the key portions of the history and exam documented by Dr. Santos-Sanchez and I reviewed pertinent patient test results.  The assessment, diagnosis, and plan were formulated together and I agree with the documentation in the resident's note. 

## 2017-05-06 ENCOUNTER — Other Ambulatory Visit: Payer: Self-pay

## 2017-05-06 ENCOUNTER — Other Ambulatory Visit: Payer: Self-pay | Admitting: *Deleted

## 2017-05-06 DIAGNOSIS — E1121 Type 2 diabetes mellitus with diabetic nephropathy: Secondary | ICD-10-CM

## 2017-05-06 NOTE — Telephone Encounter (Signed)
Called pt, he uses vials of insulin not pens, his sig other stated he did not ask for pen needles, they ask for insulin syringes at his appt and was told they would be sent to the pharmacy and were not

## 2017-05-06 NOTE — Telephone Encounter (Signed)
Requesting insulin pen needles to be filled @ [facility]. Please call pt back.

## 2017-05-07 MED ORDER — NEEDLES & SYRINGES MISC: 1 refills | Status: DC

## 2017-05-07 MED FILL — LANTUS 100 UNITS/ML VIAL: 100 | 25 days supply | Qty: 10 | Fill #3

## 2017-05-07 MED FILL — ULTICARE SYR 0.3 ML 30GX5/1: 30G X 5/16" | 25 days supply | Qty: 100 | Fill #0

## 2017-05-07 MED FILL — NovoLOG 100 UNIT/ML SOLN: 100 | 30 days supply | Qty: 10 | Fill #1

## 2017-05-07 NOTE — Telephone Encounter (Signed)
Call from pt's girlfriend - states pt is completely out of insulin needles.

## 2017-05-12 MED FILL — ATORVASTATIN 80 MG TABLET: 80 | 34 days supply | Qty: 34 | Fill #1

## 2017-05-14 ENCOUNTER — Encounter: Payer: Medicaid Other | Admitting: Dietician

## 2017-05-14 ENCOUNTER — Encounter: Payer: Self-pay | Admitting: Internal Medicine

## 2017-05-14 ENCOUNTER — Ambulatory Visit (INDEPENDENT_AMBULATORY_CARE_PROVIDER_SITE_OTHER): Payer: Medicaid Other | Admitting: Internal Medicine

## 2017-05-14 VITALS — BP 123/76 | HR 73 | Temp 97.8°F | Ht 77.0 in | Wt 301.2 lb

## 2017-05-14 DIAGNOSIS — Z794 Long term (current) use of insulin: Secondary | ICD-10-CM

## 2017-05-14 DIAGNOSIS — E1121 Type 2 diabetes mellitus with diabetic nephropathy: Secondary | ICD-10-CM | POA: Diagnosis present

## 2017-05-14 DIAGNOSIS — Z6835 Body mass index (BMI) 35.0-35.9, adult: Secondary | ICD-10-CM

## 2017-05-14 MED ORDER — INSULIN ASPART 100 UNIT/ML ~~LOC~~ SOLN: 12.0000 [IU] | Freq: Three times a day (TID) | SUBCUTANEOUS | 1 refills | Status: DC

## 2017-05-14 NOTE — Assessment & Plan Note (Signed)
Patient is here for a follow-up. He did not bring his meter to this visit. A1c checked during recent visit was 13.7. He is currently taking Lantus 40 units at bedtime, NovoLog 10 units 3 times a day with meals, and glipizide 5 mg daily. Reports checking his blood glucose twice daily and getting readings in the 200s to 300s on average. The lowest number he noticed was in the 120s and the highest was in the 400s (after eating). Wife states she is helping the patient control his carbohydrate intake.  Plan -Increased dose of NovoLog to 12 units 3 times a day with meals -Continue Lantus 40 units at bedtime -Continue glipizide 5 mg daily -Referral to ophthalmology for annual eye exam -Advised him to return to the clinic in 2 weeks with his meter.

## 2017-05-14 NOTE — Patient Instructions (Signed)
Jeremy [name] it was nice meeting you today.  -Use NovoLog 12 units 3 times a day with meals  -Continue using Lantus 40 units at bedtime  -Continue taking glipizide 5 mg daily  -Please check your blood sugar 3 times a day before meals  -Return to the clinic in 2 weeks with your meter

## 2017-05-14 NOTE — Progress Notes (Signed)
   CC: Patient is here for a follow-up of diabetes.  HPI:  Mr.Lowry Middlebrooks is a 36 y.o. male with a past medical history of conditions listed below presenting to the clinic for a follow-up of diabetes. Please see problem based charting for the status of the patient's current and chronic medical conditions.   Past Medical History:  Diagnosis Date  . Abscess of left groin   . Acute blood loss anemia 11/11/2013  . Anemia in chronic kidney disease (CKD)   . Asthma   . Boil of scrotum 11/21/2015  . Chest pain    a. 01/2015 Lexiscan MV: EF 28%, inferior, inferolateral, apical ischemia;  b. 01/2015 Cath: nl cors, PCWP 18 mmHg, CO 9.38 L/min, CI 3.53 L/min/m^2.  . Essential hypertension   . Hyperlipidemia   . Membranous glomerulonephritis   . Morbid obesity ([place])   . Nonischemic cardiomyopathy ([place])    a. 01/2015 Echo: EF 20-25%, diff HK, Gr 2 DD, Triv AI, mildly dil LA and Ao root.  . Type II diabetes mellitus ([place])    a. 01/2015 HbA1c = 8.9.   Review of Systems: Pertinent positives mentioned in HPI. Remainder of all ROS negative.   Physical Exam:  Vitals:   05/14/17 1331  BP: 123/76  Pulse: 73  Temp: 97.8 F (36.6 C)  TempSrc: Oral  SpO2: 100%  Weight: (!) 301 lb 3.2 oz (136.6 kg)  Height: 6\' 5"  (1.956 m)   Physical Exam  Constitutional: He is oriented to person, place, and time. He appears well-developed and well-nourished. No distress.  HENT:  Head: Normocephalic and atraumatic.  Mouth/Throat: Oropharynx is clear and moist.  Eyes: Right eye exhibits no discharge. Left eye exhibits no discharge.  Cardiovascular: Normal rate, regular rhythm and intact distal pulses.   Pulmonary/Chest: Effort normal and breath sounds normal. No respiratory distress. He has no wheezes. He has no rales.  Abdominal: Soft. Bowel sounds are normal. He exhibits no distension. There is no tenderness.  Musculoskeletal: He exhibits no edema.  Neurological: He is alert and oriented to person, place, and  time.  Skin: Skin is warm and dry.    Assessment & Plan:   See Encounters Tab for problem based charting.  Patient discussed with Dr. Daryll Drown

## 2017-05-18 NOTE — Progress Notes (Signed)
Internal Medicine Clinic Attending  Case discussed with Dr. Rathoreat the time of the visit. We reviewed the resident's history and exam and pertinent patient test results. I agree with the assessment, diagnosis, and plan of care documented in the resident's note.  

## 2017-05-20 MED FILL — hydrALAZINE HCL 100 MG TABS: 100 | 34 days supply | Qty: 102 | Fill #2

## 2017-05-20 MED FILL — FUROSEMIDE 80 MG TABLET: 80 | 34 days supply | Qty: 68 | Fill #3

## 2017-05-20 MED FILL — glipiZIDE 5 MG TABS: 5 | 34 days supply | Qty: 34 | Fill #2

## 2017-05-29 ENCOUNTER — Encounter: Payer: Medicaid Other | Admitting: Dietician

## 2017-06-04 MED FILL — cloNIDine HCL 0.3 MG TABS: 0.3 | 30 days supply | Qty: 60 | Fill #3

## 2017-06-05 ENCOUNTER — Other Ambulatory Visit (HOSPITAL_COMMUNITY): Payer: Self-pay | Admitting: Cardiology

## 2017-06-05 MED ORDER — POTASSIUM CHLORIDE CRYS ER 20 MEQ PO TBCR: 20.0000 meq | EXTENDED_RELEASE_TABLET | Freq: Every day | ORAL | 3 refills | Status: DC

## 2017-06-05 MED ORDER — CARVEDILOL 25 MG PO TABS: 25.0000 mg | ORAL_TABLET | Freq: Two times a day (BID) | ORAL | 6 refills | Status: DC

## 2017-06-05 MED FILL — CARVEDILOL 25 MG TABLET: 25 | 30 days supply | Qty: 60 | Fill #0

## 2017-06-05 MED FILL — POTASSIUM CL ER 20 MEQ TABL: 20 | 90 days supply | Qty: 90 | Fill #0

## 2017-06-12 MED FILL — ATORVASTATIN 80 MG TABLET: 80 | 34 days supply | Qty: 34 | Fill #2

## 2017-06-15 ENCOUNTER — Encounter: Payer: Medicaid Other | Admitting: Internal Medicine

## 2017-06-15 ENCOUNTER — Encounter: Payer: Self-pay | Admitting: Internal Medicine

## 2017-06-18 ENCOUNTER — Encounter: Payer: Self-pay | Admitting: Internal Medicine

## 2017-06-19 MED FILL — TACROLIMUS 1 MG CAPSULE: 1 | 30 days supply | Qty: 420 | Fill #2

## 2017-06-19 MED FILL — glipiZIDE 5 MG TABS: 5 | 34 days supply | Qty: 34 | Fill #3

## 2017-06-26 MED FILL — FUROSEMIDE 80 MG TABLET: 80 | 14 days supply | Qty: 28 | Fill #4

## 2017-06-26 NOTE — Addendum Note (Signed)
Addended by: Yvonna Alanis E on: 06/26/2017 03:03 PM   Modules accepted: Orders

## 2017-07-02 ENCOUNTER — Other Ambulatory Visit (HOSPITAL_COMMUNITY): Payer: Self-pay | Admitting: Internal Medicine

## 2017-07-03 ENCOUNTER — Other Ambulatory Visit (HOSPITAL_COMMUNITY): Payer: Self-pay | Admitting: *Deleted

## 2017-07-03 MED ORDER — CLONIDINE HCL 0.3 MG PO TABS: 0.3000 mg | ORAL_TABLET | Freq: Two times a day (BID) | ORAL | 3 refills | Status: DC

## 2017-07-03 MED FILL — cloNIDine HCL 0.3 MG TABS: 0.3 | 30 days supply | Qty: 60 | Fill #0

## 2017-07-08 MED FILL — CARVEDILOL 25 MG TABLET: 25 | 30 days supply | Qty: 60 | Fill #1

## 2017-07-10 ENCOUNTER — Other Ambulatory Visit (HOSPITAL_COMMUNITY): Payer: Self-pay

## 2017-07-10 MED ORDER — FUROSEMIDE 80 MG PO TABS: 80.0000 mg | ORAL_TABLET | Freq: Two times a day (BID) | ORAL | 6 refills | Status: DC

## 2017-07-10 MED FILL — FUROSEMIDE 80 MG TABLET: 80 | 30 days supply | Qty: 60 | Fill #0

## 2017-07-13 ENCOUNTER — Other Ambulatory Visit (HOSPITAL_COMMUNITY): Payer: Self-pay | Admitting: *Deleted

## 2017-07-13 DIAGNOSIS — I5042 Chronic combined systolic (congestive) and diastolic (congestive) heart failure: Secondary | ICD-10-CM

## 2017-07-13 MED ORDER — HYDRALAZINE HCL 100 MG PO TABS: 100.0000 mg | ORAL_TABLET | Freq: Three times a day (TID) | ORAL | 6 refills | Status: DC

## 2017-07-13 MED FILL — hydrALAZINE HCL 100 MG TABS: 100 | 30 days supply | Qty: 90 | Fill #0

## 2017-07-13 MED FILL — ATORVASTATIN 80 MG TABLET: 80 | 90 days supply | Qty: 90 | Fill #1

## 2017-07-14 MED FILL — NovoLOG 100 UNIT/ML SOLN: 100 | 30 days supply | Qty: 10 | Fill #2

## 2017-07-17 ENCOUNTER — Other Ambulatory Visit: Payer: Self-pay | Admitting: *Deleted

## 2017-07-17 DIAGNOSIS — E1121 Type 2 diabetes mellitus with diabetic nephropathy: Secondary | ICD-10-CM

## 2017-07-17 MED FILL — glipiZIDE 5 MG TABS: 5 | 34 days supply | Qty: 34 | Fill #4

## 2017-07-17 MED FILL — LANTUS 100 UNITS/ML VIAL: 100 | 25 days supply | Qty: 10 | Fill #0

## 2017-07-18 MED ORDER — INSULIN ASPART 100 UNIT/ML ~~LOC~~ SOLN: 12.0000 [IU] | Freq: Three times a day (TID) | SUBCUTANEOUS | 0 refills | Status: DC

## 2017-07-18 MED ORDER — INSULIN GLARGINE 100 UNIT/ML ~~LOC~~ SOLN: SUBCUTANEOUS | 0 refills | Status: DC

## 2017-07-20 ENCOUNTER — Encounter: Payer: Self-pay | Admitting: Dietician

## 2017-07-22 ENCOUNTER — Telehealth (HOSPITAL_COMMUNITY): Payer: Self-pay

## 2017-07-22 NOTE — Telephone Encounter (Signed)
CHF Clinic appointment reminder call placed to patient for upcoming post-hospital follow up.  LVMTCB to confirm apt.  Patient also reminded to take all medications as prescribed on the day of his/her appointment and to bring all medications to this appointment.  Advised to call our office for tardiness or cancellations/rescheduling needs.  .Bradley, Megan Genevea  

## 2017-07-23 ENCOUNTER — Encounter (HOSPITAL_COMMUNITY): Payer: Medicaid Other

## 2017-07-24 MED FILL — TACROLIMUS 1 MG CAPSULE: 1 | 30 days supply | Qty: 420 | Fill #3

## 2017-07-27 ENCOUNTER — Other Ambulatory Visit: Payer: Self-pay

## 2017-07-27 ENCOUNTER — Encounter: Payer: Self-pay | Admitting: Internal Medicine

## 2017-07-27 ENCOUNTER — Ambulatory Visit: Payer: Medicaid Other | Admitting: Internal Medicine

## 2017-07-27 VITALS — BP 128/82 | HR 68 | Temp 98.0°F | Ht 77.0 in | Wt 292.5 lb

## 2017-07-27 DIAGNOSIS — N289 Disorder of kidney and ureter, unspecified: Secondary | ICD-10-CM | POA: Diagnosis not present

## 2017-07-27 DIAGNOSIS — E785 Hyperlipidemia, unspecified: Secondary | ICD-10-CM

## 2017-07-27 DIAGNOSIS — I10 Essential (primary) hypertension: Secondary | ICD-10-CM

## 2017-07-27 DIAGNOSIS — Z23 Encounter for immunization: Secondary | ICD-10-CM

## 2017-07-27 DIAGNOSIS — E1121 Type 2 diabetes mellitus with diabetic nephropathy: Secondary | ICD-10-CM

## 2017-07-27 DIAGNOSIS — Z6834 Body mass index (BMI) 34.0-34.9, adult: Secondary | ICD-10-CM | POA: Diagnosis not present

## 2017-07-27 DIAGNOSIS — Z800 Family history of malignant neoplasm of digestive organs: Secondary | ICD-10-CM | POA: Diagnosis not present

## 2017-07-27 DIAGNOSIS — Z79899 Other long term (current) drug therapy: Secondary | ICD-10-CM

## 2017-07-27 DIAGNOSIS — Z833 Family history of diabetes mellitus: Secondary | ICD-10-CM | POA: Diagnosis not present

## 2017-07-27 DIAGNOSIS — Z794 Long term (current) use of insulin: Secondary | ICD-10-CM | POA: Diagnosis not present

## 2017-07-27 DIAGNOSIS — Z741 Need for assistance with personal care: Secondary | ICD-10-CM

## 2017-07-27 DIAGNOSIS — I5042 Chronic combined systolic (congestive) and diastolic (congestive) heart failure: Secondary | ICD-10-CM

## 2017-07-27 DIAGNOSIS — I110 Hypertensive heart disease with heart failure: Secondary | ICD-10-CM | POA: Diagnosis not present

## 2017-07-27 DIAGNOSIS — E7849 Other hyperlipidemia: Secondary | ICD-10-CM

## 2017-07-27 DIAGNOSIS — Z8249 Family history of ischemic heart disease and other diseases of the circulatory system: Secondary | ICD-10-CM

## 2017-07-27 DIAGNOSIS — K219 Gastro-esophageal reflux disease without esophagitis: Secondary | ICD-10-CM

## 2017-07-27 DIAGNOSIS — E1129 Type 2 diabetes mellitus with other diabetic kidney complication: Secondary | ICD-10-CM

## 2017-07-27 LAB — GLUCOSE, CAPILLARY: Glucose-Capillary: 305 mg/dL — ABNORMAL HIGH (ref 65–99)

## 2017-07-27 LAB — POCT GLYCOSYLATED HEMOGLOBIN (HGB A1C): HEMOGLOBIN A1C: 12.8

## 2017-07-27 MED ORDER — LIRAGLUTIDE 18 MG/3ML ~~LOC~~ SOPN: PEN_INJECTOR | SUBCUTANEOUS | 0 refills | Status: DC

## 2017-07-27 MED ORDER — NEEDLES & SYRINGES MISC: 1 refills | Status: DC

## 2017-07-27 MED ORDER — INSULIN PEN NEEDLE 32G X 4 MM MISC: 1.0000 "application " | Freq: Every day | 0 refills | Status: DC

## 2017-07-27 MED ORDER — INSULIN ASPART 100 UNIT/ML ~~LOC~~ SOLN: 15.0000 [IU] | Freq: Three times a day (TID) | SUBCUTANEOUS | 0 refills | Status: DC

## 2017-07-27 MED ORDER — GLIPIZIDE 5 MG PO TABS: 5.0000 mg | ORAL_TABLET | Freq: Every day | ORAL | 0 refills | Status: DC

## 2017-07-27 MED ORDER — ACCU-CHEK FASTCLIX LANCETS MISC: 12 refills | Status: DC

## 2017-07-27 NOTE — Progress Notes (Signed)
CC: GERD,difficulty with ADL's, follow up on T2DM, CHF combined, HTN, HLD  HPI:  [name] is a 36 y.o. male with PMH below here to discuss recent trouble with GERD and completing his ADL's.  With all his chronic medical conditions and morbid obesity he gets very tired and short of breath quickly which has made it difficult to bathe, cook meals, etc.    Please see A&P for status of the patient's chronic medical conditions  Past Medical History:  Diagnosis Date  . Abscess of left groin   . Acute blood loss anemia 11/11/2013  . Anemia in chronic kidney disease (CKD)   . Asthma   . Boil of scrotum 11/21/2015  . Chest pain    a. 01/2015 Lexiscan MV: EF 28%, inferior, inferolateral, apical ischemia;  b. 01/2015 Cath: nl cors, PCWP 18 mmHg, CO 9.38 L/min, CI 3.53 L/min/m^2.  . Essential hypertension   . Hyperlipidemia   . Membranous glomerulonephritis   . Morbid obesity ([place])   . Nonischemic cardiomyopathy ([place])    a. 01/2015 Echo: EF 20-25%, diff HK, Gr 2 DD, Triv AI, mildly dil LA and Ao root.  . Type II diabetes mellitus ([place])    a. 01/2015 HbA1c = 8.9.   Review of Systems: ROS: Pulmonary: pt denies increased work of breathing, does have chronicshortness of breath due to his CHF,  Cardiac: pt denies palpitations, chest pain,  Abdominal: pt denies lower abdominal pain, does report GERD esophageal symptoms, has no nausea, vomiting, or diarrhea  Physical Exam:  Vitals:   07/27/17 1620  BP: 128/82  Pulse: 68  Temp: 98 F (36.7 C)  TempSrc: Oral  SpO2: 100%  Weight: 292 lb 8 oz (132.7 kg)  Height: 6\' 5"  (1.956 m)   Physical Exam  Constitutional: He is oriented to person, place, and time. He appears well-developed and well-nourished.  Eyes: Right eye exhibits no discharge. Left eye exhibits no discharge. No scleral icterus.  Neck: No JVD (no JVD) present.  Cardiovascular: Normal rate, regular rhythm, normal heart sounds and intact distal pulses. Exam reveals no gallop and  no friction rub.  No murmur heard. Pulmonary/Chest: Effort normal and breath sounds normal. No respiratory distress. He has no wheezes. He has no rales.  Abdominal: Soft. Bowel sounds are normal. He exhibits no distension and no mass. There is no tenderness. There is no guarding.  Musculoskeletal: He exhibits no edema (no LE edema).  Neurological: He is alert and oriented to person, place, and time.    Social History   Socioeconomic History  . Marital status: Single    Spouse name: Not on file  . Number of children: Not on file  . Years of education: Not on file  . Highest education level: Not on file  Social Needs  . Financial resource strain: Not on file  . Food insecurity - worry: Not on file  . Food insecurity - inability: Not on file  . Transportation needs - medical: Not on file  . Transportation needs - non-medical: Not on file  Occupational History  . Not on file  Tobacco Use  . Smoking status: Never Smoker  . Smokeless tobacco: Never Used  Substance and Sexual Activity  . Alcohol use: No    Alcohol/week: 0.0 oz  . Drug use: No    Comment: smoked marijuana 2-3 x/wk.  [name] Sexual activity: Yes    Partners: Female  Other Topics Concern  . Not on file  Social History Narrative   Lives  in [place] by himself.  Currently in [employer].        Family History  Problem Relation Age of Onset  . Diabetes Mellitus II Mother        died @ 60.  . Gastric cancer Mother   . CAD Father        died @ 43.  [name] Heart attack Father   . Congestive Heart Failure Father   . Diabetes Mellitus II Sister   . CAD Sister        s/p PCI - age 44.    Assessment & Plan:   See Encounters Tab for problem based charting.  Patient seen with Dr. Eppie Gibson

## 2017-07-27 NOTE — Patient Instructions (Signed)
Good to see you, you're doing a good job, we just need to focus on getting your diabetes under control.  We have increased your meal time insulin to 15 units, we also added a new medicine called victoza that you will begin as well.  Your blood pressure looked good, please come back in one week for a lipid panel so we can check your cholesterol.  It was lower last year but still not as low as we'd like.

## 2017-07-28 ENCOUNTER — Encounter: Payer: Self-pay | Admitting: Internal Medicine

## 2017-07-28 MED ORDER — RANITIDINE HCL 150 MG PO TABS: 150.0000 mg | ORAL_TABLET | Freq: Every day | ORAL | 0 refills | Status: DC | PRN

## 2017-07-28 MED FILL — raNITIdine HCL 150 MG TABS: 150 | 34 days supply | Qty: 34 | Fill #0

## 2017-07-28 MED FILL — ACCU-CHEK FASTCLIX LANCETS: 34 days supply | Qty: 102 | Fill #0

## 2017-07-28 MED FILL — ULTICARE INS 0.5 ML 30GX1/2: 30G X 1/2" | 25 days supply | Qty: 100 | Fill #0

## 2017-07-29 MED FILL — AMLODIPINE BESYLATE 10 MG T: 10 | 30 days supply | Qty: 30 | Fill #4

## 2017-07-29 NOTE — Assessment & Plan Note (Addendum)
Patient has no issues with heart failure recently other than shortness of breath that is chronic, and difficulty with ADL's no acute change. Weight down to 292 about 8lbs in last 3 months.  Creatinine improved slightly a little over a month ago to 2.24. No CHF exacerbations since 05/2016  His clinical exam looks good today, no JVD, clear lungs, no LE edema.  Pt followed by cardiology for heart failure.  Not on ACE/ARB therapy due to renal decline, followed by [facility]. Pt on carvedilol, hydralazine as well.  Pt's blood pressure is well controlled so I am not inclined to make changes today since pt has an appointment with cardiology on 08/06/17.  Would prefer BIDIL over hydralazine alone.    -continue current CHF regimen

## 2017-07-29 NOTE — Assessment & Plan Note (Signed)
Pt with symptoms consistent with GERD.  Burning pain radiating up throat with sour taste.  Had history of this in the past but only recently started acting up again.    -will start trial of ranitidine daily PRN 150mg  and encouraged weight loss.

## 2017-07-29 NOTE — Assessment & Plan Note (Signed)
BP Readings from Last 3 Encounters:  07/27/17 128/82  05/14/17 123/76  04/30/17 122/75    Blood pressure continues to be well controlled.  No issues with BP medicines.  -continue current blood pressure regimen

## 2017-07-29 NOTE — Assessment & Plan Note (Signed)
Lab Results  Component Value Date   HGBA1C 12.8 07/27/2017   A1C continues to be elevated but one point better than last visit.  Pt continues to not bring his meter and checks his blood sugar intermittently. Has not noticed any lows, seems to have fastings that are close to normal range if he's giving an accurate report.  Currently on lantus 40, novolog 12 units TID w/meals, glipizide 5mg .  Not on metformin due to poor renal function.  -will increase prandial insulin from 12 units to 15 units with meals -added victoza to give additional support to post prandial glucose control -referred pt for eye exam

## 2017-07-29 NOTE — Assessment & Plan Note (Signed)
Lab Results  Component Value Date   CHOL 180 11/29/2015   HDL 31 (L) 11/29/2015   LDLCALC 108 11/29/2015   TRIG 205 (H) 11/29/2015   CHOLHDL 5.8 (H) 11/29/2015   Pt needs new lipid panel to evaluate efficacy of lipitor 80mg .  Last reading cholesterol too high with pts significant medical comorbidities.  -lab closed today, pt said he would return next week to check lipid panel -will likely need another agent

## 2017-07-30 NOTE — Progress Notes (Signed)
I saw and evaluated the patient.  I personally confirmed the key portions of Dr. Winfrey's history and exam and reviewed pertinent patient test results.  The assessment, diagnosis, and plan were formulated together and I agree with the documentation in the resident's note. 

## 2017-07-31 MED FILL — cloNIDine HCL 0.3 MG TABS: 0.3 | 30 days supply | Qty: 60 | Fill #1

## 2017-08-06 ENCOUNTER — Ambulatory Visit (HOSPITAL_COMMUNITY)
Admission: RE | Admit: 2017-08-06 | Discharge: 2017-08-06 | Disposition: A | Discharge status: Home / Self Care | Payer: Medicaid Other | Source: Ambulatory Visit | Attending: Internal Medicine | Admitting: Internal Medicine

## 2017-08-06 ENCOUNTER — Telehealth: Payer: Self-pay | Admitting: Internal Medicine

## 2017-08-06 ENCOUNTER — Other Ambulatory Visit: Payer: Self-pay | Admitting: Internal Medicine

## 2017-08-06 VITALS — BP 152/96 | HR 71 | Wt 297.0 lb

## 2017-08-06 DIAGNOSIS — R29818 Other symptoms and signs involving the nervous system: Secondary | ICD-10-CM

## 2017-08-06 DIAGNOSIS — Z7982 Long term (current) use of aspirin: Secondary | ICD-10-CM | POA: Insufficient documentation

## 2017-08-06 DIAGNOSIS — I129 Hypertensive chronic kidney disease with stage 1 through stage 4 chronic kidney disease, or unspecified chronic kidney disease: Secondary | ICD-10-CM | POA: Diagnosis not present

## 2017-08-06 DIAGNOSIS — E119 Type 2 diabetes mellitus without complications: Secondary | ICD-10-CM | POA: Diagnosis not present

## 2017-08-06 DIAGNOSIS — I5042 Chronic combined systolic (congestive) and diastolic (congestive) heart failure: Secondary | ICD-10-CM | POA: Diagnosis present

## 2017-08-06 DIAGNOSIS — J45909 Unspecified asthma, uncomplicated: Secondary | ICD-10-CM | POA: Diagnosis not present

## 2017-08-06 DIAGNOSIS — E785 Hyperlipidemia, unspecified: Secondary | ICD-10-CM | POA: Insufficient documentation

## 2017-08-06 DIAGNOSIS — N183 Chronic kidney disease, stage 3 unspecified: Secondary | ICD-10-CM

## 2017-08-06 DIAGNOSIS — N049 Nephrotic syndrome with unspecified morphologic changes: Secondary | ICD-10-CM | POA: Diagnosis not present

## 2017-08-06 DIAGNOSIS — Z8249 Family history of ischemic heart disease and other diseases of the circulatory system: Secondary | ICD-10-CM | POA: Insufficient documentation

## 2017-08-06 DIAGNOSIS — Z79899 Other long term (current) drug therapy: Secondary | ICD-10-CM | POA: Insufficient documentation

## 2017-08-06 DIAGNOSIS — R0683 Snoring: Secondary | ICD-10-CM

## 2017-08-06 DIAGNOSIS — I10 Essential (primary) hypertension: Secondary | ICD-10-CM | POA: Diagnosis not present

## 2017-08-06 DIAGNOSIS — E1122 Type 2 diabetes mellitus with diabetic chronic kidney disease: Secondary | ICD-10-CM | POA: Diagnosis not present

## 2017-08-06 DIAGNOSIS — Z794 Long term (current) use of insulin: Secondary | ICD-10-CM | POA: Insufficient documentation

## 2017-08-06 DIAGNOSIS — I5032 Chronic diastolic (congestive) heart failure: Secondary | ICD-10-CM | POA: Insufficient documentation

## 2017-08-06 DIAGNOSIS — Z6835 Body mass index (BMI) 35.0-35.9, adult: Secondary | ICD-10-CM | POA: Insufficient documentation

## 2017-08-06 DIAGNOSIS — D649 Anemia, unspecified: Secondary | ICD-10-CM | POA: Insufficient documentation

## 2017-08-06 DIAGNOSIS — N189 Chronic kidney disease, unspecified: Secondary | ICD-10-CM | POA: Diagnosis not present

## 2017-08-06 DIAGNOSIS — I130 Hypertensive heart and chronic kidney disease with heart failure and stage 1 through stage 4 chronic kidney disease, or unspecified chronic kidney disease: Secondary | ICD-10-CM | POA: Diagnosis not present

## 2017-08-06 DIAGNOSIS — E669 Obesity, unspecified: Secondary | ICD-10-CM | POA: Diagnosis not present

## 2017-08-06 DIAGNOSIS — I428 Other cardiomyopathies: Secondary | ICD-10-CM

## 2017-08-06 LAB — BASIC METABOLIC PANEL
Anion gap: 4 — ABNORMAL LOW (ref 5–15)
BUN: 36 mg/dL — AB (ref 6–20)
CO2: 24 mmol/L (ref 22–32)
CREATININE: 1.96 mg/dL — AB (ref 0.61–1.24)
Calcium: 8.5 mg/dL — ABNORMAL LOW (ref 8.9–10.3)
Chloride: 106 mmol/L (ref 101–111)
GFR calc Af Amer: 49 mL/min — ABNORMAL LOW (ref >60)
GFR, EST NON AFRICAN AMERICAN: 42 mL/min — AB (ref >60)
Glucose, Bld: 355 mg/dL — ABNORMAL HIGH (ref 65–99)
POTASSIUM: 4.5 mmol/L (ref 3.5–5.1)
Sodium: 134 mmol/L — ABNORMAL LOW (ref 135–145)

## 2017-08-06 MED FILL — CARVEDILOL 25 MG TABLET: 25 | 30 days supply | Qty: 60 | Fill #2

## 2017-08-06 NOTE — Patient Instructions (Signed)
Routine lab work today. Will notify you of abnormal results, otherwise no news is good news!  Follow up 4 months with echocardiogram and appointment with Dr. Aundra Dubin.  Take all medication as prescribed the day of your appointment. Bring all medications with you to your appointment.  Do the following things EVERYDAY: 1) Weigh yourself in the morning before breakfast. Write it down and keep it in a log. 2) Take your medicines as prescribed 3) Eat low salt foods-Limit salt (sodium) to 2000 mg per day.  4) Stay as active as you can everyday 5) Limit all fluids for the day to less than 2 liters

## 2017-08-06 NOTE — Progress Notes (Signed)
Patient ID: Jeremy [name], male   DOB: [birth year], 36 y.o.   MRN: [id number]    Advanced Heart Failure Clinic Note   PCP: Dr. Randell Patient Primary cardiologist: Dr. Aundra Dubin  36 yo with history of nonischemic cardiomyopathy, HTN, and DM presents for cardiology followup.  In 4/16, patient was seen in the ER with edema and chest pain.  The only medication he was taking was metformin.  BP was elevated.  Echo showed EF 20-25%, and Cardiolite suggested ischemia.  He had RHC/LHC in 5/16.  This showed no coronary disease and mildly elevated filling pressures.    Admitted 9/21 through 06/03/16 with marked volume overload and hypertensive crisis. Diuresed with IV lasix and transitioned to lasix 160 mg twice daily. Due to worsening renal function, nephrology consulted. Work up consistent with nephrotic syndrome. 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  Discharge creatinine 3.42.  He has been following with renal. He has been getting iron infusions and feels like he has more energy.  EF improved to 55% on 9/17 echo.   He presents today for regular follow up. Last seen 02/2017. BP mildly elevated on arrival but he was in a hurry and JUST took his medications prior to his arrival.  BP in PCP clinic last week was 614 systolic. Pt states this is wear it usually runs. Denies SOB with any activity or on flat ground. Denies orthopnea, lightheadedness, or dizziness. Continues to eat fast food and drinks more than 2 L a day. Sugars > 200 daily. PCP looking at adding additional DM medications. Pt adamant he is taking medications as directed, though sometimes takes his first doses between 10-12 in the am. Pt is also adamant he had his sleep study, all though no records available in Epic, and several un-fulfilled orders are still in place.   Labs (6/16): LDL 308, HDL 46, TGs 284 Labs (7/16): K 3.5, creatinine 1.0 Labs 05/24/2015: K 3.9 Creatinine 1.43  Labs (3/17): creatinine 1.55, LDL  108, HDL 31 Labs (9/17): hemoglobin 8.3, K 4, BUN 40, creatinine 3.69, BNP 56 Labs (06/03/2016) : K 4.6 Creatinine 3.42  Labs (10/17): K 4.4, creatinine 3.44, hgb 9.4 Labs (1/18): K 4.9, creatinine 1.85  PMH: 1. Type II diabetes since 2006.  2. HTN since around 2010.   3. Asthma 4. Hyperlipidemia 5. Morbid obesity 6. Cardiomyopathy: Nonischemic.  Echo (5/16) with EF 20-25%, diffuse hypokinesis, grade II diastolic dysfunction.  Lexiscan Cardiolite (5/16) with EF 28%, inferior/inferolateral/apical ischemia.  RHC/LHC (5/16) with normal coronaries, mean RA 11, PA 33/21 mean 28, mean PCWP 18, CI 2.53.  - Echo (4/17): EF 50%, moderate LVH, normal RV size and systolic function. Grade IDD  - Echo (9/17): EF 55%.  7. ABIs normal 8/16.  8. CKD: AKI in 9/17 with diagnosis of nephrotic syndrome. 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   SH: Lives with girlfriend, nonsmoker, no cocaine, occasional marijuana, no ETOH.  Works as Art gallery manager.  FH: Father with CHF, died at 65.  Sister with CHF, PAD.   Review of systems complete and found to be negative unless listed in HPI.    Current Outpatient Medications  Medication Sig Dispense Refill  . ACCU-CHEK FASTCLIX LANCETS MISC Check your blood sugars 4 times a day. 102 each 12  . amLODipine (NORVASC) 10 MG tablet TAKE 1 TABLET BY MOUTH ONCE DAILY AT BEDTIME 30 tablet 6  . ASPIR-LOW 81 MG EC tablet TAKE 1 TABLET BY MOUTH DAILY. 30 tablet 11  .  atorvastatin (LIPITOR) 80 MG tablet Take 1 tablet (80 mg total) by mouth daily. 90 tablet 2  . carvedilol (COREG) 25 MG tablet Take 1 tablet (25 mg total) by mouth 2 (two) times daily with a meal. 60 tablet 6  . cloNIDine (CATAPRES) 0.3 MG tablet Take 1 tablet (0.3 mg total) by mouth 2 (two) times daily. 60 tablet 3  . ferrous sulfate 325 (65 FE) MG tablet Take 325 mg by mouth daily with breakfast.    . furosemide (LASIX) 80 MG tablet Take 1 tablet (80 mg total) by mouth 2 (two)  times daily. 60 tablet 6  . glipiZIDE (GLUCOTROL) 5 MG tablet Take 1 tablet (5 mg total) daily by mouth. 60 tablet 0  . glucose blood (ACCU-CHEK GUIDE) test strip Check your blood sugars 4 times a day. 100 each 12  . hydrALAZINE (APRESOLINE) 100 MG tablet Take 1 tablet (100 mg total) 3 (three) times daily by mouth. 90 tablet 6  . insulin aspart (NOVOLOG) 100 UNIT/ML injection Inject 15 Units 3 (three) times daily with meals into the skin. 40.5 mL 0  . insulin glargine (LANTUS) 100 UNIT/ML injection Inject 40 units into the skin at bedtime. (IM Program) 30 mL 0  . Insulin Pen Needle (CAREFINE PEN NEEDLES) 32G X 4 MM MISC 1 application daily by Does not apply route. 100 each 0  . Needles & Syringes MISC Use 4 times daily with insulin injections. diag code E11.9. Insulin dependent 360 each 1  . potassium chloride SA (KLOR-CON M20) 20 MEQ tablet Take 1 tablet (20 mEq total) by mouth daily. 90 tablet 3  . ranitidine (ZANTAC) 150 MG tablet Take 1 tablet (150 mg total) daily as needed by mouth for heartburn. 90 tablet 0  . tacrolimus (PROGRAF) 1 MG capsule Take 1 mg by mouth 2 (two) times daily.  5  . liraglutide 18 MG/3ML SOPN Inject 0.1 mLs (0.6 mg total) daily for 7 days into the skin, THEN 0.2 mLs (1.2 mg total) daily. (Patient not taking: No sig reported) 18.7 mL 0  . nitroGLYCERIN (NITROSTAT) 0.4 MG SL tablet Place 1 tablet (0.4 mg total) under the tongue every 5 (five) minutes as needed for chest pain. (Patient not taking: Reported on 08/06/2017) 25 tablet 3   No current facility-administered medications for this encounter.    Vitals:   08/06/17 1419  BP: (!) 152/96  Pulse: 71  SpO2: 100%  Weight: 297 lb (134.7 kg)   Wt Readings from Last 3 Encounters:  08/06/17 297 lb (134.7 kg)  07/27/17 292 lb 8 oz (132.7 kg)  05/14/17 (!) 301 lb 3.2 oz (136.6 kg)   Physical Exam General: Well appearing. No resp difficulty. HEENT: Normal Neck: Supple. JVP 5-6. Carotids 2+ bilat; no bruits. No  thyromegaly or nodule noted. Cor: PMI nondisplaced. RRR, No M/G/R noted Lungs: CTAB, normal effort. Abdomen: Soft, non-tender, non-distended, no HSM. No bruits or masses. +BS  Extremities: No cyanosis, clubbing, or rash. R and LLE no edema.  Neuro: Alert & orientedx3, cranial nerves grossly intact. moves all 4 extremities w/o difficulty. Affect pleasant t   Assessment/Plan:  1. Chronic diastolic CHF: Nonischemic cardiomyopathy. ?from long-standing HTN. EF was 20-25% in 2006 but ECHO 12/2015 had showed improvement to EF 50%, Grade 1 DD.  ECHO 05/2016 EF 55%.  - NYHA II symptoms.  - Volume status stable on exam  - Continue lasix 80 mg BID. BMET today.  2. Suspected OSA:  - Ordered for sleep study last year. Still  hasn't had by our records, though patient is adamant he has here at [facility]. Will call and confirm, and if he has not, will re-schedule.  3.  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  LDL much better in 3/17, suspect related to compliance with atorvastatin.  - Continue atoravastatin. No change. Per PCP.  4. HTN:  - Elevated on arrival as above. Has been stable at home and other visits.  - Stressed importance of med compliance and timely dosage of TID meds like hydralazine.  5. 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  - Follows with renal.  - BMET today.  - Getting Prograf.  6. Anemia - Stable.stable 7. DM - Awaiting approval for Victoza - Hgb A1c 07/2017 was 12.8.   Labs today. Sleep study as above.   Shirley Friar, PA-C  08/06/2017   Greater than 50% of the 25 minute visit was spent in counseling/coordination of care regarding disease state education, salt/fluid restriction, sliding scale diuretics, and medication compliance.

## 2017-08-06 NOTE — Progress Notes (Signed)
Advanced Heart Failure Medication Review by a Pharmacist  Does the patient  feel that his/her medications are working for him/her?  yes  Has the patient been experiencing any side effects to the medications prescribed?  no  Does the patient measure his/her own blood pressure or blood glucose at home?  Not asked at this visit    Does the patient have any problems obtaining medications due to transportation or finances?   no  Understanding of regimen: good Understanding of indications: good Potential of compliance: good Patient understands to avoid NSAIDs. Patient understands to avoid decongestants.  Issues to address at subsequent visits: none   Pharmacist comments: Jeremy [name] is a 36 year old black male presenting to clinic today with his wife and son. He did not have a list of his medications or bottles but his wife was very familiar with his medications. He did take his morning doses of medication this morning and reports adherence to the current regimen. He does not have any medication related questions or concerns at this time.    Time with patient: 10 Preparation and documentation time: 2 Total time: 12

## 2017-08-06 NOTE — Telephone Encounter (Signed)
Patient calling requesting medicine for authorize, called in on 07/28/2017

## 2017-08-07 ENCOUNTER — Telehealth: Payer: Self-pay | Admitting: *Deleted

## 2017-08-07 NOTE — Telephone Encounter (Addendum)
Call to [facility] this am information was given for PA consideration for Victoza.  RTC this pm denied.  Patient will need to try 2 other medications Bydureon or Byetta.  If patient gets medication on Monday patient will only need to try and fail Metformin which he has and the medication will be covered.  Sander Nephew, RN 08/07/2017 4:56 PM.  Information for Victoza PA was resubmitted and sent to [place] [facility].  Awaiting answer from [place].  Sander Nephew, RN 08/10/2017 9:55 AM.  Prior Authorization for Victoza was approved 08/10/2017 thru 08/05/2018. [place] was called and notified of.  Sander Nephew, RN 08/10/2017 1;22 PM.

## 2017-08-10 MED FILL — VICTOZA 2-PAK 18 MG/3 ML PE: 18 | 28 days supply | Qty: 6 | Fill #0

## 2017-08-10 MED FILL — UNIFINE PENTIPS 32GX5/32: 32G X 4 MM | 30 days supply | Qty: 30 | Fill #0

## 2017-08-10 MED FILL — UNIFINE PENTIPS 32GX5/32": 32G X 4 MM | 30 days supply | Qty: 30 | Fill #0

## 2017-08-11 ENCOUNTER — Encounter: Payer: Self-pay | Admitting: Internal Medicine

## 2017-08-11 MED FILL — FUROSEMIDE 80 MG TABLET: 80 | 30 days supply | Qty: 60 | Fill #1

## 2017-08-24 MED FILL — glipiZIDE 5 MG TABS: 5 | 34 days supply | Qty: 34 | Fill #5

## 2017-08-27 MED FILL — hydrALAZINE HCL 100 MG TABS: 100 | 30 days supply | Qty: 90 | Fill #1

## 2017-08-28 MED FILL — LANTUS 100 UNITS/ML VIAL: 100 | 25 days supply | Qty: 10 | Fill #0

## 2017-08-28 MED FILL — NovoLOG 100 UNIT/ML SOLN: 100 | 22 days supply | Qty: 10 | Fill #0

## 2017-09-02 MED FILL — POTASSIUM CL ER 20 MEQ TABL: 20 | 90 days supply | Qty: 90 | Fill #1

## 2017-09-02 MED FILL — TACROLIMUS 1 MG CAPSULE: 1 | 30 days supply | Qty: 420 | Fill #4

## 2017-09-02 MED FILL — cloNIDine HCL 0.3 MG TABS: 0.3 | 30 days supply | Qty: 60 | Fill #2

## 2017-09-02 MED FILL — raNITIdine HCL 150 MG TABS: 150 | 34 days supply | Qty: 34 | Fill #1

## 2017-09-09 MED FILL — CARVEDILOL 25 MG TABLET: 25 | 30 days supply | Qty: 60 | Fill #3

## 2017-09-11 MED FILL — FUROSEMIDE 80 MG TABLET: 80 | 30 days supply | Qty: 60 | Fill #2

## 2017-09-14 ENCOUNTER — Other Ambulatory Visit (HOSPITAL_COMMUNITY): Payer: Self-pay | Admitting: Adult Health

## 2017-09-14 DIAGNOSIS — I5042 Chronic combined systolic (congestive) and diastolic (congestive) heart failure: Secondary | ICD-10-CM

## 2017-09-21 ENCOUNTER — Telehealth: Payer: Self-pay | Admitting: *Deleted

## 2017-09-21 NOTE — Telephone Encounter (Signed)
CALLED PATIENT LEFT VOICE MESSAGE FOR PATIENT REGARDING HIS EYE APPOINTMENT. [facility] ORIGINAL APPT 12-14-018 / APPOINTMENT HAS BEEN RESCHEDULED FOR January 25.019 @ 10:AM . OFFICE SAID PAPER WORK HAS BEEN MAILED TO PATIENT. SPOKE WITH MISHA.   ASKED PATIENT TO  CALL [facility] IS ANY QUESTION REGARDING THIS MESSAGE.

## 2017-09-22 ENCOUNTER — Ambulatory Visit: Payer: Medicaid Other | Admitting: Internal Medicine

## 2017-09-22 ENCOUNTER — Encounter: Payer: Self-pay | Admitting: Internal Medicine

## 2017-09-22 ENCOUNTER — Other Ambulatory Visit: Payer: Self-pay

## 2017-09-22 VITALS — BP 110/72 | HR 93 | Temp 97.5°F | Wt 296.6 lb

## 2017-09-22 DIAGNOSIS — I428 Other cardiomyopathies: Secondary | ICD-10-CM

## 2017-09-22 DIAGNOSIS — K219 Gastro-esophageal reflux disease without esophagitis: Secondary | ICD-10-CM

## 2017-09-22 DIAGNOSIS — I10 Essential (primary) hypertension: Secondary | ICD-10-CM | POA: Diagnosis not present

## 2017-09-22 DIAGNOSIS — Z79899 Other long term (current) drug therapy: Secondary | ICD-10-CM | POA: Diagnosis not present

## 2017-09-22 DIAGNOSIS — R6881 Early satiety: Secondary | ICD-10-CM

## 2017-09-22 DIAGNOSIS — E119 Type 2 diabetes mellitus without complications: Secondary | ICD-10-CM | POA: Diagnosis not present

## 2017-09-22 DIAGNOSIS — N052 Unspecified nephritic syndrome with diffuse membranous glomerulonephritis: Secondary | ICD-10-CM

## 2017-09-22 MED ORDER — ESOMEPRAZOLE MAGNESIUM 40 MG PO CPDR: 40.0000 mg | DELAYED_RELEASE_CAPSULE | Freq: Every day | ORAL | 0 refills | Status: DC

## 2017-09-22 MED FILL — ESOMEPRAZOLE MAG DR 40 MG C: 40 | 30 days supply | Qty: 30 | Fill #0

## 2017-09-22 NOTE — Progress Notes (Signed)
Internal Medicine Clinic Attending  Case discussed with Dr. Huang at the time of the visit.  We reviewed the resident's history and exam and pertinent patient test results.  I agree with the assessment, diagnosis, and plan of care documented in the resident's note. 

## 2017-09-22 NOTE — Patient Instructions (Addendum)
FOLLOW-UP INSTRUCTIONS When: 6 weeks For: BP and diabetes management, follow up on GERD What to bring: medications, glucometer   Jeremy [name],  It was a pleasure to meet you today.  Your symptoms are most consistent with reflux. Please start taking Nexium 40mg  once a day. If your symptoms do not improve on this medicine in 4-6 weeks, please return to clinic and we can discuss a referral to GI at that point.  Your blood pressure was excellent today. Please continue to take your other medicines as prescribed.  Please return to clinic in 6 weeks for a follow-up visit with your PCP.    Food Choices for Gastroesophageal Reflux Disease, Adult When you have gastroesophageal reflux disease (GERD), the foods you eat and your eating habits are very important. Choosing the right foods can help ease your discomfort. What guidelines do I need to follow?  Choose fruits, vegetables, whole grains, and low-fat dairy products.  Choose low-fat meat, fish, and poultry.  Limit fats such as oils, salad dressings, butter, nuts, and avocado.  Keep a food diary. This helps you identify foods that cause symptoms.  Avoid foods that cause symptoms. These may be different for everyone.  Eat small meals often instead of 3 large meals a day.  Eat your meals slowly, in a place where you are relaxed.  Limit fried foods.  Cook foods using methods other than frying.  Avoid drinking alcohol.  Avoid drinking large amounts of liquids with your meals.  Avoid bending over or lying down until 2-3 hours after eating. What foods are not recommended? These are some foods and drinks that may make your symptoms worse: Vegetables Tomatoes. Tomato juice. Tomato and spaghetti sauce. Chili peppers. Onion and garlic. Horseradish. Fruits Oranges, grapefruit, and lemon (fruit and juice). Meats High-fat meats, fish, and poultry. This includes hot dogs, ribs, ham, sausage, salami, and bacon. Dairy Whole milk and  chocolate milk. Sour cream. Cream. Butter. Ice cream. Cream cheese. Drinks Coffee and tea. Bubbly (carbonated) drinks or energy drinks. Condiments Hot sauce. Barbecue sauce. Sweets/Desserts Chocolate and cocoa. Donuts. Peppermint and spearmint. Fats and Oils High-fat foods. This includes [place] fries and potato chips. Other Vinegar. Strong spices. This includes black pepper, white pepper, red pepper, cayenne, curry powder, cloves, ginger, and chili powder. The items listed above may not be a complete list of foods and drinks to avoid. Contact your dietitian for more information. This information is not intended to replace advice given to you by your health care provider. Make sure you discuss any questions you have with your health care provider. Document Released: 02/24/2012 Document Revised: 01/31/2016 Document Reviewed: 06/29/2013 Elsevier Interactive Patient Education  2017 [employer].

## 2017-09-22 NOTE — Assessment & Plan Note (Addendum)
Assessment Blood pressure well-controlled at 110/72. On amlodipine 10 mg daily, carvedilol 25 mg twice a day, clonidine 0.3 mg twice a day, Lasix 80 mg twice a day, and hydralazine 100 mg 3 times a day. Denies headache, chest pain, or blurry vision.  Plan - Continue amlodipine, carvedilol, clonidine, Lasix, and hydralazine

## 2017-09-22 NOTE — Assessment & Plan Note (Addendum)
Assessment Patient was seen in clinic in November 2018 with heartburn symptoms. He was started a trial of ranitidine 150 mg daily. He states that since that time, his heartburn symptoms have improved, however for the last week he endorses a sour taste in his mouth and epigastric pain. He denies nausea, vomiting, changes in his bowel movements, cough, or hematemesis. He has never seen a gastroenterologist before.  He states that he was previously on a proton pump inhibitor, but this was several years ago. He has not tried any over-the-counter medications. He denies weight changes or changes in his diet. He does endorse recent decreased appetite and early satiety. Denies smoking, alcohol, chocolate, or peppermint intake.  His symptoms are most consistent with reflux. I suspect his recent early satiety is secondary to initiation of liraglutide in November. We will trial a PPI for 6 weeks and reevaluate for symptom improvement. I do not want this to be a long term medication for him given his decreased renal function. Hopefully, the PPI will provide improvement in the short term and with lifestyle modifications and weight loss, he will be able to be weaned off the PPI.  Plan - Trial of Nexium 40 mg daily for 6 weeks - Return to clinic in 6 weeks - If no improvement at that time, can consider GI referral

## 2017-09-22 NOTE — Progress Notes (Signed)
   CC: sour taste in mouth and epigastric pain  HPI:  [name] is a 37 y.o. male with PMH significant for hypertension, diabetes, NICM (EF 55% on echo in September 2017), and membranous glomerulonephritis who presents with a sour taste in his mouth and epigastric pain, worse in the last 1 week.  Past Medical History:  Diagnosis Date  . Abscess of left groin   . Acute blood loss anemia 11/11/2013  . Anemia in chronic kidney disease (CKD)   . Asthma   . Boil of scrotum 11/21/2015  . Chest pain    a. 01/2015 Lexiscan MV: EF 28%, inferior, inferolateral, apical ischemia;  b. 01/2015 Cath: nl cors, PCWP 18 mmHg, CO 9.38 L/min, CI 3.53 L/min/m^2.  . Essential hypertension   . Hyperlipidemia   . Membranous glomerulonephritis   . Morbid obesity ([place])   . Nonischemic cardiomyopathy ([place])    a. 01/2015 Echo: EF 20-25%, diff HK, Gr 2 DD, Triv AI, mildly dil LA and Ao root.  . Type II diabetes mellitus ([place])    a. 01/2015 HbA1c = 8.9.   Review of Systems:   GEN: no weight loss or gain CV: No chest pain ABD: Mild epigastric abdominal pain. No N/V, changes in BMs. Positive for early satiety. Positive for sour taste in his mouth.  Physical Exam:  Vitals:   09/22/17 1525  BP: 110/72  Pulse: 93  Temp: (!) 97.5 F (36.4 C)  TempSrc: Oral  Weight: 296 lb 9.6 oz (134.5 kg)   GEN: Well-appearing young male sitting on bed in NAD. THROAT: Moist mucus membranes. No erythema. CV: NR & RR, no m/r/g ABD: Mild tenderness to palpation in epigastric area. Normal bowel sounds. No rebound or guarding.  Assessment & Plan:   See Encounters Tab for problem based charting.  Patient discussed with Dr. Ellison Carwin, MD Internal Medicine, PGY-1

## 2017-09-24 NOTE — Addendum Note (Signed)
Addended by: Truddie Crumble on: 09/24/2017 11:07 AM   Modules accepted: Orders

## 2017-09-30 MED FILL — glipiZIDE 5 MG TABS: 5 | 34 days supply | Qty: 34 | Fill #6

## 2017-09-30 MED FILL — NovoLOG 100 UNIT/ML SOLN: 100 | 22 days supply | Qty: 10 | Fill #1

## 2017-09-30 MED FILL — LANTUS 100 UNITS/ML VIAL: 100 | 25 days supply | Qty: 10 | Fill #1

## 2017-10-02 MED FILL — ATORVASTATIN 80 MG TABLET: 80 | 90 days supply | Qty: 90 | Fill #2

## 2017-10-02 MED FILL — hydrALAZINE HCL 100 MG TABS: 100 | 30 days supply | Qty: 90 | Fill #2

## 2017-10-02 MED FILL — cloNIDine HCL 0.3 MG TABS: 0.3 | 30 days supply | Qty: 60 | Fill #3

## 2017-10-08 MED FILL — CARVEDILOL 25 MG TABLET: 25 | 30 days supply | Qty: 60 | Fill #4

## 2017-10-09 ENCOUNTER — Encounter: Payer: Self-pay | Admitting: Internal Medicine

## 2017-10-09 ENCOUNTER — Other Ambulatory Visit: Payer: Self-pay | Admitting: Internal Medicine

## 2017-10-09 DIAGNOSIS — E1121 Type 2 diabetes mellitus with diabetic nephropathy: Secondary | ICD-10-CM

## 2017-10-09 DIAGNOSIS — K219 Gastro-esophageal reflux disease without esophagitis: Secondary | ICD-10-CM

## 2017-10-09 MED ORDER — RANITIDINE HCL 150 MG PO TABS: ORAL_TABLET | ORAL | 4 refills | Status: DC

## 2017-10-09 NOTE — Addendum Note (Signed)
Addended by: Hulan Fray on: 10/09/2017 08:25 PM   Modules accepted: Orders

## 2017-10-12 MED FILL — raNITIdine HCL 150 MG TABS: 150 | 30 days supply | Qty: 60 | Fill #0

## 2017-10-14 MED FILL — FUROSEMIDE 80 MG TABLET: 80 | 30 days supply | Qty: 60 | Fill #3

## 2017-10-14 MED FILL — TACROLIMUS 1 MG CAPSULE: 1 | 30 days supply | Qty: 420 | Fill #5

## 2017-10-20 ENCOUNTER — Other Ambulatory Visit: Payer: Self-pay | Admitting: Internal Medicine

## 2017-10-20 DIAGNOSIS — E1121 Type 2 diabetes mellitus with diabetic nephropathy: Secondary | ICD-10-CM

## 2017-10-26 ENCOUNTER — Other Ambulatory Visit: Payer: Self-pay | Admitting: Internal Medicine

## 2017-10-26 DIAGNOSIS — E1121 Type 2 diabetes mellitus with diabetic nephropathy: Secondary | ICD-10-CM

## 2017-10-26 NOTE — Telephone Encounter (Signed)
refilled 

## 2017-10-27 MED FILL — VICTOZA 2-PAK 18 MG/3 ML PE: 18 | 28 days supply | Qty: 6 | Fill #0

## 2017-10-30 ENCOUNTER — Other Ambulatory Visit: Payer: Self-pay | Admitting: Internal Medicine

## 2017-10-30 ENCOUNTER — Encounter: Payer: Self-pay | Admitting: Internal Medicine

## 2017-11-04 MED FILL — cloNIDine HCL 0.3 MG TABS: 0.3 | 30 days supply | Qty: 60 | Fill #0

## 2017-11-09 MED FILL — glipiZIDE 5 MG TABS: 5 | 30 days supply | Qty: 30 | Fill #0

## 2017-11-09 MED FILL — CARVEDILOL 25 MG TABLET: 25 | 30 days supply | Qty: 60 | Fill #5

## 2017-11-10 ENCOUNTER — Other Ambulatory Visit: Payer: Self-pay | Admitting: *Deleted

## 2017-11-10 DIAGNOSIS — Z794 Long term (current) use of insulin: Secondary | ICD-10-CM

## 2017-11-10 DIAGNOSIS — E1121 Type 2 diabetes mellitus with diabetic nephropathy: Secondary | ICD-10-CM

## 2017-11-10 MED ORDER — GLIPIZIDE 5 MG PO TABS: 5.0000 mg | ORAL_TABLET | Freq: Every day | ORAL | 3 refills | Status: DC

## 2017-11-10 NOTE — Telephone Encounter (Signed)
Per pharmacy "only 10 tablets left on rx"

## 2017-11-16 MED FILL — hydrALAZINE HCL 100 MG TABS: 100 | 30 days supply | Qty: 90 | Fill #3

## 2017-11-16 MED FILL — AMLODIPINE BESYLATE 10 MG T: 10 | 30 days supply | Qty: 30 | Fill #5

## 2017-11-16 MED FILL — ULTICARE INS 0.5 ML 30GX1/2: 30G X 1/2" | 25 days supply | Qty: 100 | Fill #1

## 2017-11-16 MED FILL — FUROSEMIDE 80 MG TABLET: 80 | 30 days supply | Qty: 60 | Fill #4

## 2017-11-16 MED FILL — NovoLOG 100 UNIT/ML SOLN: 100 | 22 days supply | Qty: 10 | Fill #2

## 2017-11-30 MED FILL — UNIFINE PENTIPS 32GX5/32: 32G X 4 MM | 30 days supply | Qty: 30 | Fill #1

## 2017-11-30 MED FILL — POTASSIUM CL ER 20 MEQ TABL: 20 | 90 days supply | Qty: 90 | Fill #2

## 2017-11-30 MED FILL — UNIFINE PENTIPS 32GX5/32": 32G X 4 MM | 30 days supply | Qty: 30 | Fill #1

## 2017-11-30 MED FILL — cloNIDine HCL 0.3 MG TABS: 0.3 | 30 days supply | Qty: 60 | Fill #1

## 2017-11-30 MED FILL — VICTOZA 2-PAK 18 MG/3 ML PE: 18 | 28 days supply | Qty: 6 | Fill #1

## 2017-11-30 MED FILL — raNITIdine HCL 150 MG TABS: 150 | 30 days supply | Qty: 60 | Fill #1

## 2017-12-01 MED FILL — TACROLIMUS 1 MG CAPSULE: 1 | 30 days supply | Qty: 300 | Fill #0

## 2017-12-03 ENCOUNTER — Encounter (HOSPITAL_COMMUNITY): Payer: Medicaid Other | Admitting: Cardiology

## 2017-12-03 ENCOUNTER — Ambulatory Visit (HOSPITAL_COMMUNITY): Payer: Medicaid Other

## 2017-12-09 MED FILL — CARVEDILOL 25 MG TABLET: 25 | 30 days supply | Qty: 60 | Fill #6

## 2017-12-11 MED FILL — glipiZIDE 5 MG TABS: 5 | 30 days supply | Qty: 30 | Fill #1

## 2017-12-21 MED FILL — FUROSEMIDE 80 MG TABLET: 80 | 30 days supply | Qty: 60 | Fill #5

## 2017-12-21 MED FILL — AMLODIPINE BESYLATE 10 MG T: 10 | 30 days supply | Qty: 30 | Fill #6

## 2017-12-21 MED FILL — ULTICARE INS 0.5 ML 30GX1/2: 30G X 1/2" | 25 days supply | Qty: 100 | Fill #2

## 2017-12-21 MED FILL — LANTUS 100 UNITS/ML VIAL: 100 | 25 days supply | Qty: 10 | Fill #2

## 2017-12-21 MED FILL — NovoLOG 100 UNIT/ML SOLN: 100 | 22 days supply | Qty: 10 | Fill #3

## 2017-12-24 ENCOUNTER — Ambulatory Visit: Payer: Medicaid Other

## 2017-12-24 IMAGING — CR DG CHEST 1V PORT
1 series · 1 of 1 positions shown · non-contrast
Comparison: 11/23/2013

CLINICAL DATA: Central line placement.

EXAM:
PORTABLE CHEST 1 VIEW

[AP]
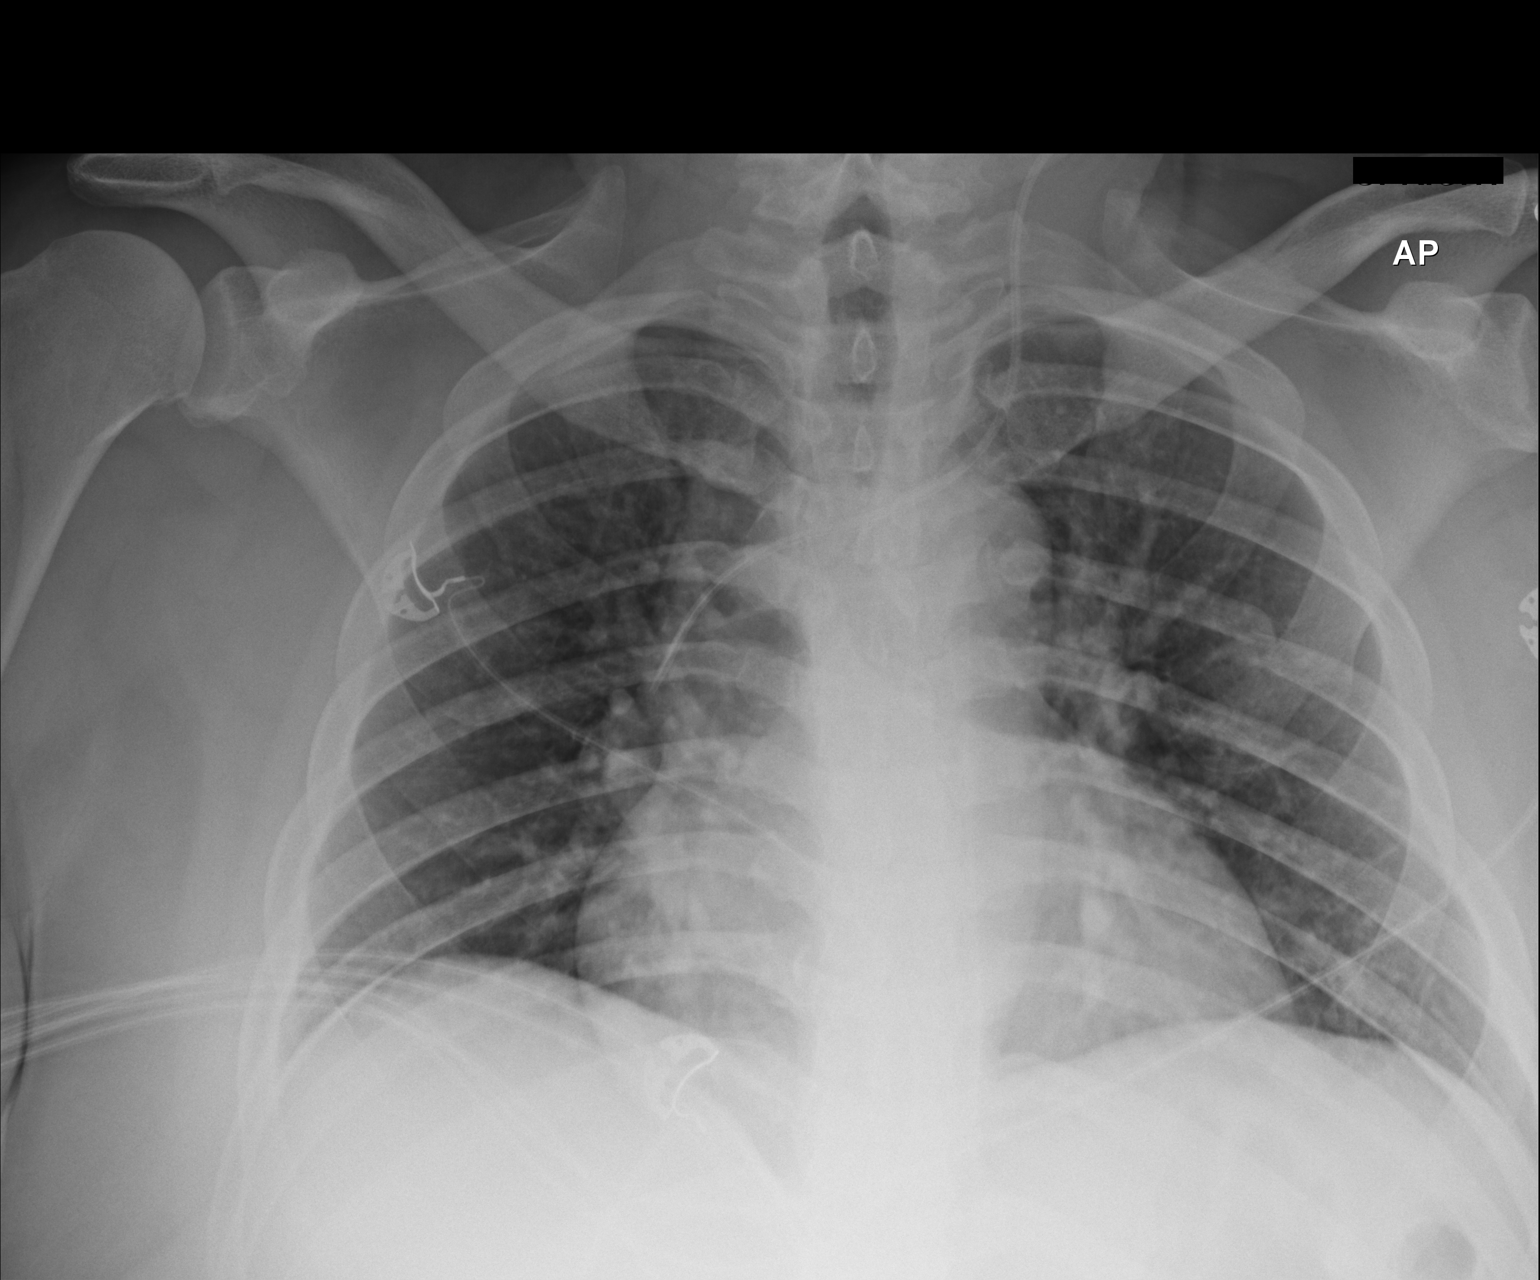

[1 of 1 positions shown; findings below may reference images not displayed]

FINDINGS: Left IJ center venous catheter tip is in the mid distal SVC just
below the carina. Low lung volumes with vascular crowding and
atelectasis. Accentuation of heart size due to low lung volumes and
the AP projection and portable technique. No edema or effusions.
IMPRESSION: Left IJ center venous catheter in good position without complicating
features.

Low lung volumes with vascular crowding and streaky atelectasis.

## 2017-12-31 ENCOUNTER — Encounter: Payer: Self-pay | Admitting: Internal Medicine

## 2017-12-31 ENCOUNTER — Ambulatory Visit: Payer: Medicaid Other

## 2017-12-31 MED FILL — cloNIDine HCL 0.3 MG TABS: 0.3 | 30 days supply | Qty: 60 | Fill #2

## 2017-12-31 MED FILL — hydrALAZINE HCL 100 MG TABS: 100 | 30 days supply | Qty: 90 | Fill #4

## 2018-01-01 ENCOUNTER — Other Ambulatory Visit (HOSPITAL_COMMUNITY): Payer: Self-pay | Admitting: Student

## 2018-01-01 DIAGNOSIS — I5042 Chronic combined systolic (congestive) and diastolic (congestive) heart failure: Secondary | ICD-10-CM

## 2018-01-01 DIAGNOSIS — E785 Hyperlipidemia, unspecified: Secondary | ICD-10-CM

## 2018-01-01 MED FILL — ATORVASTATIN 80 MG TABLET: 80 | 30 days supply | Qty: 30 | Fill #0

## 2018-01-01 MED FILL — NITROGLYCERIN 0.4 MG TAB SL: 0.4 | 15 days supply | Qty: 25 | Fill #0

## 2018-01-08 ENCOUNTER — Other Ambulatory Visit (HOSPITAL_COMMUNITY): Payer: Self-pay | Admitting: Internal Medicine

## 2018-01-08 ENCOUNTER — Encounter (HOSPITAL_COMMUNITY): Payer: Self-pay | Admitting: Cardiology

## 2018-01-08 MED FILL — CARVEDILOL 25 MG TABLET: 25 | 30 days supply | Qty: 60 | Fill #0

## 2018-01-08 MED FILL — TACROLIMUS 1 MG CAPSULE: 1 | 30 days supply | Qty: 300 | Fill #1

## 2018-01-14 MED FILL — glipiZIDE 5 MG TABS: 5 | 90 days supply | Qty: 90 | Fill #0

## 2018-01-14 MED FILL — raNITIdine HCL 150 MG TABS: 150 | 30 days supply | Qty: 60 | Fill #2

## 2018-01-20 ENCOUNTER — Other Ambulatory Visit: Payer: Self-pay

## 2018-01-20 ENCOUNTER — Ambulatory Visit (HOSPITAL_BASED_OUTPATIENT_CLINIC_OR_DEPARTMENT_OTHER)
Admission: RE | Admit: 2018-01-20 | Discharge: 2018-01-20 | Disposition: A | Discharge status: Home / Self Care | Payer: Medicaid Other | Source: Ambulatory Visit | Attending: Cardiology | Admitting: Cardiology

## 2018-01-20 ENCOUNTER — Ambulatory Visit (HOSPITAL_COMMUNITY)
Admission: RE | Admit: 2018-01-20 | Discharge: 2018-01-20 | Disposition: A | Discharge status: Home / Self Care | Payer: Medicaid Other | Source: Ambulatory Visit | Attending: Student | Admitting: Student

## 2018-01-20 ENCOUNTER — Encounter (HOSPITAL_COMMUNITY): Payer: Self-pay | Admitting: Cardiology

## 2018-01-20 VITALS — BP 140/97 | HR 82 | Wt 284.0 lb

## 2018-01-20 DIAGNOSIS — I10 Essential (primary) hypertension: Secondary | ICD-10-CM

## 2018-01-20 DIAGNOSIS — Z7982 Long term (current) use of aspirin: Secondary | ICD-10-CM | POA: Diagnosis not present

## 2018-01-20 DIAGNOSIS — N052 Unspecified nephritic syndrome with diffuse membranous glomerulonephritis: Secondary | ICD-10-CM | POA: Diagnosis not present

## 2018-01-20 DIAGNOSIS — I5042 Chronic combined systolic (congestive) and diastolic (congestive) heart failure: Secondary | ICD-10-CM

## 2018-01-20 DIAGNOSIS — E785 Hyperlipidemia, unspecified: Secondary | ICD-10-CM | POA: Insufficient documentation

## 2018-01-20 DIAGNOSIS — N049 Nephrotic syndrome with unspecified morphologic changes: Secondary | ICD-10-CM | POA: Diagnosis not present

## 2018-01-20 DIAGNOSIS — I130 Hypertensive heart and chronic kidney disease with heart failure and stage 1 through stage 4 chronic kidney disease, or unspecified chronic kidney disease: Secondary | ICD-10-CM | POA: Diagnosis not present

## 2018-01-20 DIAGNOSIS — Z79899 Other long term (current) drug therapy: Secondary | ICD-10-CM | POA: Diagnosis not present

## 2018-01-20 DIAGNOSIS — J45909 Unspecified asthma, uncomplicated: Secondary | ICD-10-CM | POA: Diagnosis not present

## 2018-01-20 DIAGNOSIS — N189 Chronic kidney disease, unspecified: Secondary | ICD-10-CM | POA: Diagnosis not present

## 2018-01-20 DIAGNOSIS — I428 Other cardiomyopathies: Secondary | ICD-10-CM | POA: Diagnosis not present

## 2018-01-20 DIAGNOSIS — Z794 Long term (current) use of insulin: Secondary | ICD-10-CM | POA: Insufficient documentation

## 2018-01-20 DIAGNOSIS — E1122 Type 2 diabetes mellitus with diabetic chronic kidney disease: Secondary | ICD-10-CM | POA: Diagnosis not present

## 2018-01-20 DIAGNOSIS — E1121 Type 2 diabetes mellitus with diabetic nephropathy: Secondary | ICD-10-CM | POA: Insufficient documentation

## 2018-01-20 LAB — BASIC METABOLIC PANEL
ANION GAP: 9 (ref 5–15)
BUN: 33 mg/dL — ABNORMAL HIGH (ref 6–20)
CHLORIDE: 103 mmol/L (ref 101–111)
CO2: 28 mmol/L (ref 22–32)
Calcium: 9 mg/dL (ref 8.9–10.3)
Creatinine, Ser: 2.11 mg/dL — ABNORMAL HIGH (ref 0.61–1.24)
GFR calc non Af Amer: 38 mL/min — ABNORMAL LOW (ref >60)
GFR, EST AFRICAN AMERICAN: 44 mL/min — AB (ref >60)
Glucose, Bld: 278 mg/dL — ABNORMAL HIGH (ref 65–99)
POTASSIUM: 3.9 mmol/L (ref 3.5–5.1)
Sodium: 140 mmol/L (ref 135–145)

## 2018-01-20 NOTE — Progress Notes (Signed)
  Echocardiogram 2D Echocardiogram has been performed.  Jeremy [name] F 01/20/2018, 2:00 PM

## 2018-01-20 NOTE — Patient Instructions (Signed)
Labs drawn today (if we do not call you, then your lab work was stable)   Your physician recommends that you schedule a follow-up appointment in: 6 months with Dr. Aundra Dubin Please Call an Schedule Appointment  (September, 2019)

## 2018-01-22 MED FILL — FUROSEMIDE 80 MG TABLET: 80 | 30 days supply | Qty: 60 | Fill #6

## 2018-01-22 NOTE — Progress Notes (Signed)
Patient ID: Jeremy [name], male   DOB: September 04, 1981, 37 y.o.   MRN: [id number]    Advanced Heart Failure Clinic Note   PCP: Dr. Randell Patient Primary cardiologist: Dr. Aundra Dubin  37 y.o. with history of nonischemic cardiomyopathy, HTN, and DM presents for cardiology followup.  In 4/16, patient was seen in the ER with edema and chest pain.  The only medication he was taking was metformin.  BP was elevated.  Echo showed EF 20-25%, and Cardiolite suggested ischemia.  He had RHC/LHC in 5/16.  This showed no coronary disease and mildly elevated filling pressures.    Admitted 9/21 through 06/03/16 with marked volume overload and hypertensive crisis. Diuresed with IV lasix and transitioned to lasix 160 mg twice daily. Due to worsening renal function, nephrology consulted. Work up consistent with nephrotic syndrome. 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  Discharge creatinine 3.42.  He has been following with renal. He has been getting iron infusions and feels like he has more energy.  EF improved to 55% on 9/17 echo.    Echo was done today and reviewed, EF 50-55%, moderate LVH, normal RV.   He presents today for followup of CHF.  Weight is down 13 lbs.  He is short of breath with heavy lifting.  He is mildly short of breath after walking a block.  BP high in the office but always < 277 systolic when he checks at home.  No orthopnea/PND.  He is on tacrolimus for membranous glomerulopathy.   Labs (6/16): LDL 308, HDL 46, TGs 284 Labs (7/16): K 3.5, creatinine 1.0 Labs 05/24/2015: K 3.9 Creatinine 1.43  Labs (3/17): creatinine 1.55, LDL 108, HDL 31 Labs (9/17): hemoglobin 8.3, K 4, BUN 40, creatinine 3.69, BNP 56 Labs (06/03/2016) : K 4.6 Creatinine 3.42  Labs (10/17): K 4.4, creatinine 3.44, hgb 9.4 Labs (1/18): K 4.9, creatinine 1.85 Labs (4/19): K 4.6, creatinine 2.14  PMH: 1. Type II diabetes since 2006.  2. HTN since around 2010.   3. Asthma 4.  Hyperlipidemia 5. Morbid obesity 6. Cardiomyopathy: Nonischemic.  Echo (5/16) with EF 20-25%, diffuse hypokinesis, grade II diastolic dysfunction.  Lexiscan Cardiolite (5/16) with EF 28%, inferior/inferolateral/apical ischemia.  RHC/LHC (5/16) with normal coronaries, mean RA 11, PA 33/21 mean 28, mean PCWP 18, CI 2.53.  - Echo (4/17): EF 50%, moderate LVH, normal RV size and systolic function. Grade IDD  - Echo (9/17): EF 55%.  - Echo (5/19): EF 50-55%, moderate LVH, normal RV size and systolic function.  7. ABIs normal 8/16.  8. CKD: AKI in 9/17 with diagnosis of nephrotic syndrome. 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   SH: Lives with girlfriend, nonsmoker, no cocaine, occasional marijuana, no ETOH.  Works as Art gallery manager.  FH: Father with CHF, died at 42.  Sister with CHF, PAD.   Review of systems complete and found to be negative unless listed in HPI.    Current Outpatient Medications  Medication Sig Dispense Refill  . ACCU-CHEK FASTCLIX LANCETS MISC Check your blood sugars 4 times a day. 102 each 12  . amLODipine (NORVASC) 10 MG tablet TAKE 1 TABLET BY MOUTH ONCE DAILY AT BEDTIME 30 tablet 6  . ASPIR-LOW 81 MG EC tablet TAKE 1 TABLET BY MOUTH DAILY. 30 tablet 11  . atorvastatin (LIPITOR) 80 MG tablet Take 1 tablet (80 mg total) by mouth daily. 90 tablet 2  . atorvastatin (LIPITOR) 80 MG tablet TAKE 1 TABLET (80 MG TOTAL) BY MOUTH DAILY. [place]  tablet 2  . carvedilol (COREG) 25 MG tablet TAKE 1 TABLET (25 MG TOTAL) BY MOUTH 2 (TWO) TIMES DAILY WITH A MEAL. 60 tablet 6  . cloNIDine (CATAPRES) 0.3 MG tablet Take 1 tablet (0.3 mg total) by mouth 2 (two) times daily. 60 tablet 3  . ferrous sulfate 325 (65 FE) MG tablet Take 325 mg by mouth daily with breakfast.    . furosemide (LASIX) 80 MG tablet Take 1 tablet (80 mg total) by mouth 2 (two) times daily. 60 tablet 6  . glipiZIDE (GLUCOTROL) 5 MG tablet Take 1 tablet (5 mg total) by mouth daily. 90 tablet 3    . glucose blood (ACCU-CHEK GUIDE) test strip Check your blood sugars 4 times a day. 100 each 12  . hydrALAZINE (APRESOLINE) 100 MG tablet Take 1 tablet (100 mg total) 3 (three) times daily by mouth. 90 tablet 6  . insulin aspart (NOVOLOG) 100 UNIT/ML injection Inject 15 Units 3 (three) times daily with meals into the skin. 40.5 mL 0  . insulin glargine (LANTUS) 100 UNIT/ML injection Inject 40 units into the skin at bedtime. (IM Program) 30 mL 0  . Needles & Syringes MISC Use 4 times daily with insulin injections. diag code E11.9. Insulin dependent 360 each 1  . nitroGLYCERIN (NITROSTAT) 0.4 MG SL tablet PLACE 1 TABLET UNDER THE TONGUE EVERY 5 MINUTES AS NEEDED FOR CHEST PAIN. 25 tablet 3  . potassium chloride SA (KLOR-CON M20) 20 MEQ tablet Take 1 tablet (20 mEq total) by mouth daily. 90 tablet 3  . ranitidine (ZANTAC) 150 MG tablet Take 1 to 2  150mg  tablets daily as needed. 90 tablet 4  . tacrolimus (PROGRAF) 1 MG capsule Take 7 mg by mouth 2 (two) times daily.   5  . VICTOZA 18 MG/3ML SOPN INJECT 0.6 MG TOTAL DAILY FOR 7 DAYS INTO THE SKIN, THEN 1.2 MG TOTAL DAILY. 6 mL 3   No current facility-administered medications for this encounter.    Vitals:   01/20/18 1400  BP: (!) 140/97  Pulse: 82  SpO2: 100%  Weight: 284 lb (128.8 kg)   Wt Readings from Last 3 Encounters:  01/20/18 284 lb (128.8 kg)  09/22/17 296 lb 9.6 oz (134.5 kg)  08/06/17 297 lb (134.7 kg)   Physical Exam General: NAD Neck: No JVD, no thyromegaly or thyroid nodule.  Lungs: Clear to auscultation bilaterally with normal respiratory effort. CV: Nondisplaced PMI.  Heart regular S1/S2, no S3/S4, no murmur.  No peripheral edema.  No carotid bruit.  Normal pedal pulses.  Abdomen: Soft, nontender, no hepatosplenomegaly, no distention.  Skin: Intact without lesions or rashes.  Neurologic: Alert and oriented x 3.  Psych: Normal affect. Extremities: No clubbing or cyanosis.  HEENT: Normal.   Assessment/Plan:  1.  Chronic diastolic CHF: Nonischemic cardiomyopathy, ?from long-standing HTN.  EF was 20-25% in 2006 but EF has improved.  Echo today was reviewed, EF 50-55% with moderate LVH and normal RV systolic function. NYHA class II.  He is not volume overloaded on exam.  I think the key to maintaining his cardiac function is going to be to keep his BP controlled. Weight is coming down.  - Continue Lasix 80 mg bid - Continue Coreg 25 mg bid.  2.  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  LDL much better in 3/17, suspect related to compliance with atorvastatin.  - Continue atoravastatin. Will need to check lipids next appt.  3. HTN: Imperative to control for both CHF and renal dysfunction.  Currently seems  reasonably controlled.  - Continue current regimen.  4. 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 Follows with renal.  - BMET today.  - Getting tacrolimus.    Loralie Champagne, MD  01/22/2018

## 2018-01-29 ENCOUNTER — Other Ambulatory Visit: Payer: Self-pay | Admitting: Internal Medicine

## 2018-01-29 DIAGNOSIS — E1121 Type 2 diabetes mellitus with diabetic nephropathy: Secondary | ICD-10-CM

## 2018-01-29 MED FILL — cloNIDine HCL 0.3 MG TABS: 0.3 | 30 days supply | Qty: 60 | Fill #3

## 2018-01-29 MED FILL — ATORVASTATIN 80 MG TABLET: 80 | 30 days supply | Qty: 30 | Fill #1

## 2018-01-29 NOTE — Telephone Encounter (Signed)
refilled 

## 2018-02-03 ENCOUNTER — Telehealth: Payer: Self-pay | Admitting: *Deleted

## 2018-02-03 DIAGNOSIS — Z794 Long term (current) use of insulin: Secondary | ICD-10-CM

## 2018-02-03 DIAGNOSIS — E1121 Type 2 diabetes mellitus with diabetic nephropathy: Secondary | ICD-10-CM

## 2018-02-03 MED ORDER — INSULIN PEN NEEDLE 32G X 4 MM MISC: 1.0000 | Freq: Four times a day (QID) | 12 refills | Status: DC

## 2018-02-03 NOTE — Telephone Encounter (Signed)
Refilled pen needles.

## 2018-02-03 NOTE — Telephone Encounter (Signed)
Fax from [facility] - pt needs a refill on Unifine pentips 32G x 5/32 qty # 100. Thanks

## 2018-02-08 MED FILL — CARVEDILOL 25 MG TABLET: 25 | 30 days supply | Qty: 60 | Fill #1

## 2018-02-08 MED FILL — hydrALAZINE HCL 100 MG TABS: 100 | 30 days supply | Qty: 90 | Fill #5

## 2018-02-09 ENCOUNTER — Other Ambulatory Visit (HOSPITAL_COMMUNITY): Payer: Self-pay | Admitting: Student

## 2018-02-09 MED FILL — NovoLOG 100 UNIT/ML SOLN: 100 | 22 days supply | Qty: 10 | Fill #0

## 2018-02-09 MED FILL — LANTUS 100 UNITS/ML VIAL: 100 | 25 days supply | Qty: 10 | Fill #0

## 2018-02-09 MED FILL — VICTOZA 2-PAK 18 MG/3 ML PE: 18 | 28 days supply | Qty: 6 | Fill #2

## 2018-02-09 MED FILL — UNIFINE PENTIPS 32GX5/32: 32G X 4 MM | 30 days supply | Qty: 30 | Fill #0

## 2018-02-09 MED FILL — ULTICARE INS 0.5 ML 30GX1/2: 30G X 1/2" | 25 days supply | Qty: 100 | Fill #3

## 2018-02-09 MED FILL — UNIFINE PENTIPS 32GX5/32": 32G X 4 MM | 30 days supply | Qty: 30 | Fill #0

## 2018-02-10 NOTE — Telephone Encounter (Signed)
Received fax from [facility]-clarify instructions for pen needles for billing purposes.  Does pt inject 3 or 4 times daily.  Per pt's chart-he injects victoza one time daily.  I contacted pharmacy and they have already spoke with triage nurse.  No further action needed, phone call complete.Marland KitchenMarland KitchenDespina Hidden Cassady6/5/201912:16 PM

## 2018-02-23 ENCOUNTER — Other Ambulatory Visit (HOSPITAL_COMMUNITY): Payer: Self-pay | Admitting: Cardiology

## 2018-02-23 MED FILL — raNITIdine HCL 150 MG TABS: 150 | 30 days supply | Qty: 60 | Fill #3

## 2018-02-23 MED FILL — FUROSEMIDE 80 MG TABLET: 80 | 30 days supply | Qty: 60 | Fill #0

## 2018-03-01 ENCOUNTER — Other Ambulatory Visit (HOSPITAL_COMMUNITY): Payer: Self-pay | Admitting: Internal Medicine

## 2018-03-01 MED FILL — POTASSIUM CL ER 20 MEQ TABL: 20 | 90 days supply | Qty: 90 | Fill #3

## 2018-03-01 MED FILL — ATORVASTATIN 80 MG TABLET: 80 | 30 days supply | Qty: 30 | Fill #2

## 2018-03-01 MED FILL — cloNIDine HCL 0.3 MG TABS: 0.3 | 30 days supply | Qty: 60 | Fill #0

## 2018-03-02 MED FILL — TACROLIMUS 1 MG CAPSULE: 1 | 30 days supply | Qty: 300 | Fill #2

## 2018-03-08 ENCOUNTER — Encounter: Payer: Medicaid Other | Admitting: Internal Medicine

## 2018-03-08 DIAGNOSIS — N184 Chronic kidney disease, stage 4 (severe): Secondary | ICD-10-CM | POA: Diagnosis not present

## 2018-03-09 DIAGNOSIS — N184 Chronic kidney disease, stage 4 (severe): Secondary | ICD-10-CM | POA: Diagnosis not present

## 2018-03-10 DIAGNOSIS — N184 Chronic kidney disease, stage 4 (severe): Secondary | ICD-10-CM | POA: Diagnosis not present

## 2018-03-11 DIAGNOSIS — N184 Chronic kidney disease, stage 4 (severe): Secondary | ICD-10-CM | POA: Diagnosis not present

## 2018-03-12 DIAGNOSIS — N184 Chronic kidney disease, stage 4 (severe): Secondary | ICD-10-CM | POA: Diagnosis not present

## 2018-03-12 MED FILL — CARVEDILOL 25 MG TABLET: 25 | 30 days supply | Qty: 60 | Fill #2

## 2018-03-13 DIAGNOSIS — N184 Chronic kidney disease, stage 4 (severe): Secondary | ICD-10-CM | POA: Diagnosis not present

## 2018-03-14 DIAGNOSIS — N184 Chronic kidney disease, stage 4 (severe): Secondary | ICD-10-CM | POA: Diagnosis not present

## 2018-03-15 DIAGNOSIS — N184 Chronic kidney disease, stage 4 (severe): Secondary | ICD-10-CM | POA: Diagnosis not present

## 2018-03-16 DIAGNOSIS — N184 Chronic kidney disease, stage 4 (severe): Secondary | ICD-10-CM | POA: Diagnosis not present

## 2018-03-17 DIAGNOSIS — N184 Chronic kidney disease, stage 4 (severe): Secondary | ICD-10-CM | POA: Diagnosis not present

## 2018-03-18 DIAGNOSIS — N184 Chronic kidney disease, stage 4 (severe): Secondary | ICD-10-CM | POA: Diagnosis not present

## 2018-03-19 DIAGNOSIS — N184 Chronic kidney disease, stage 4 (severe): Secondary | ICD-10-CM | POA: Diagnosis not present

## 2018-03-20 DIAGNOSIS — N184 Chronic kidney disease, stage 4 (severe): Secondary | ICD-10-CM | POA: Diagnosis not present

## 2018-03-21 DIAGNOSIS — N184 Chronic kidney disease, stage 4 (severe): Secondary | ICD-10-CM | POA: Diagnosis not present

## 2018-03-22 DIAGNOSIS — N184 Chronic kidney disease, stage 4 (severe): Secondary | ICD-10-CM | POA: Diagnosis not present

## 2018-03-23 DIAGNOSIS — N184 Chronic kidney disease, stage 4 (severe): Secondary | ICD-10-CM | POA: Diagnosis not present

## 2018-03-24 DIAGNOSIS — N184 Chronic kidney disease, stage 4 (severe): Secondary | ICD-10-CM | POA: Diagnosis not present

## 2018-03-25 DIAGNOSIS — N184 Chronic kidney disease, stage 4 (severe): Secondary | ICD-10-CM | POA: Diagnosis not present

## 2018-03-26 DIAGNOSIS — N184 Chronic kidney disease, stage 4 (severe): Secondary | ICD-10-CM | POA: Diagnosis not present

## 2018-03-26 MED FILL — LANTUS 100 UNITS/ML VIAL: 100 | 25 days supply | Qty: 10 | Fill #1

## 2018-03-26 MED FILL — FUROSEMIDE 80 MG TABLET: 80 | 30 days supply | Qty: 60 | Fill #1

## 2018-03-26 MED FILL — NovoLOG 100 UNIT/ML SOLN: 100 | 22 days supply | Qty: 10 | Fill #1

## 2018-03-27 DIAGNOSIS — N184 Chronic kidney disease, stage 4 (severe): Secondary | ICD-10-CM | POA: Diagnosis not present

## 2018-03-28 DIAGNOSIS — N184 Chronic kidney disease, stage 4 (severe): Secondary | ICD-10-CM | POA: Diagnosis not present

## 2018-03-29 ENCOUNTER — Other Ambulatory Visit: Payer: Self-pay

## 2018-03-29 ENCOUNTER — Ambulatory Visit: Payer: Medicaid Other | Admitting: Internal Medicine

## 2018-03-29 ENCOUNTER — Encounter: Payer: Self-pay | Admitting: Internal Medicine

## 2018-03-29 VITALS — BP 124/72 | HR 76 | Temp 97.9°F | Ht 77.0 in | Wt 290.0 lb

## 2018-03-29 DIAGNOSIS — Z79899 Other long term (current) drug therapy: Secondary | ICD-10-CM

## 2018-03-29 DIAGNOSIS — R29818 Other symptoms and signs involving the nervous system: Secondary | ICD-10-CM

## 2018-03-29 DIAGNOSIS — E785 Hyperlipidemia, unspecified: Secondary | ICD-10-CM | POA: Diagnosis not present

## 2018-03-29 DIAGNOSIS — E7849 Other hyperlipidemia: Secondary | ICD-10-CM | POA: Diagnosis not present

## 2018-03-29 DIAGNOSIS — E1121 Type 2 diabetes mellitus with diabetic nephropathy: Secondary | ICD-10-CM | POA: Diagnosis not present

## 2018-03-29 DIAGNOSIS — Z794 Long term (current) use of insulin: Secondary | ICD-10-CM

## 2018-03-29 DIAGNOSIS — Z23 Encounter for immunization: Secondary | ICD-10-CM

## 2018-03-29 DIAGNOSIS — R0683 Snoring: Secondary | ICD-10-CM | POA: Diagnosis not present

## 2018-03-29 DIAGNOSIS — Z833 Family history of diabetes mellitus: Secondary | ICD-10-CM

## 2018-03-29 DIAGNOSIS — R5383 Other fatigue: Secondary | ICD-10-CM | POA: Diagnosis not present

## 2018-03-29 DIAGNOSIS — N184 Chronic kidney disease, stage 4 (severe): Secondary | ICD-10-CM | POA: Diagnosis not present

## 2018-03-29 DIAGNOSIS — B351 Tinea unguium: Secondary | ICD-10-CM | POA: Insufficient documentation

## 2018-03-29 LAB — GLUCOSE, CAPILLARY: Glucose-Capillary: 325 mg/dL — ABNORMAL HIGH (ref 70–99)

## 2018-03-29 LAB — POCT GLYCOSYLATED HEMOGLOBIN (HGB A1C): Hemoglobin A1C: 9.6 % — AB (ref 4.0–5.6)

## 2018-03-29 MED ORDER — INSULIN GLARGINE 100 UNIT/ML ~~LOC~~ SOLN: SUBCUTANEOUS | 2 refills | Status: DC

## 2018-03-29 MED ORDER — LIRAGLUTIDE 18 MG/3ML ~~LOC~~ SOPN: 1.8000 mg | PEN_INJECTOR | Freq: Every day | SUBCUTANEOUS | 2 refills | Status: DC

## 2018-03-29 MED ORDER — BLOOD GLUCOSE MONITOR KIT: PACK | 0 refills | Status: DC

## 2018-03-29 NOTE — Progress Notes (Signed)
CC: follow up of T2DM, HLD, OSA, also discussed his ongoing onychomycosis  HPI:  [name] is a 37 y.o. male with PMH below.  He is here for follow up of T2DM, HLD.  We also discussed his ongoing onychomycosis.   Please see A&P for status of the patient's chronic medical conditions  Past Medical History:  Diagnosis Date  . Abscess of left groin   . Acute blood loss anemia 11/11/2013  . Anemia in chronic kidney disease (CKD)   . Asthma   . Boil of scrotum 11/21/2015  . Chest pain    a. 01/2015 Lexiscan MV: EF 28%, inferior, inferolateral, apical ischemia;  b. 01/2015 Cath: nl cors, PCWP 18 mmHg, CO 9.38 L/min, CI 3.53 L/min/m^2.  . Essential hypertension   . Hyperlipidemia   . Membranous glomerulonephritis   . Morbid obesity ([place])   . Nonischemic cardiomyopathy ([place])    a. 01/2015 Echo: EF 20-25%, diff HK, Gr 2 DD, Triv AI, mildly dil LA and Ao root.  . Type II diabetes mellitus ([place])    a. 01/2015 HbA1c = 8.9.   Review of Systems:  ROS: Pulmonary: pt denies increased work of breathing, shortness of breath,  Cardiac: pt denies palpitations, chest pain,  Abdominal: pt denies abdominal pain, nausea, vomiting, or diarrhea  Physical Exam:  Vitals:   03/29/18 1531  BP: 124/72  Pulse: 76  Temp: 97.9 F (36.6 C)  TempSrc: Oral  SpO2: 100%  Weight: 290 lb (131.5 kg)  Height: 6\' 5"  (1.956 m)   Physical Exam  Eyes: Right eye exhibits no discharge. Left eye exhibits no discharge. No scleral icterus.  Neck: No JVD present.  Cardiovascular: Normal rate, regular rhythm, normal heart sounds and intact distal pulses. Exam reveals no gallop and no friction rub.  No murmur heard. Pulmonary/Chest: Effort normal and breath sounds normal. No respiratory distress. He has no wheezes. He has no rales.  Musculoskeletal: He exhibits no edema (no LE edema).  Neurological: He is alert.  Skin: He is not diaphoretic.    Social History   Socioeconomic History  . Marital status:  Single    Spouse name: Not on file  . Number of children: Not on file  . Years of education: Not on file  . Highest education level: Not on file  Occupational History  . Not on file  Social Needs  . Financial resource strain: Not on file  . Food insecurity:    Worry: Not on file    Inability: Not on file  . Transportation needs:    Medical: Not on file    Non-medical: Not on file  Tobacco Use  . Smoking status: Never Smoker  . Smokeless tobacco: Never Used  Substance and Sexual Activity  . Alcohol use: No    Alcohol/week: 0.0 oz  . Drug use: Yes    Types: Marijuana    Comment: smoked marijuana 2-3 x/wk.  [name] Sexual activity: Yes    Partners: Female  Lifestyle  . Physical activity:    Days per week: Not on file    Minutes per session: Not on file  . Stress: Not on file  Relationships  . Social connections:    Talks on phone: Not on file    Gets together: Not on file    Attends religious service: Not on file    Active member of club or organization: Not on file    Attends meetings of clubs or organizations: Not on file    Relationship  status: Not on file  . Intimate partner violence:    Fear of current or ex partner: Not on file    Emotionally abused: Not on file    Physically abused: Not on file    Forced sexual activity: Not on file  Other Topics Concern  . Not on file  Social History Narrative   Lives in [place] by himself.  Currently in [facility].        Family History  Problem Relation Age of Onset  . Diabetes Mellitus II Mother        died @ 59.  . Gastric cancer Mother   . CAD Father        died @ 66.  [name] Heart attack Father   . Congestive Heart Failure Father   . Diabetes Mellitus II Sister   . CAD Sister        s/p PCI - age 37.    Assessment & Plan:   See Encounters Tab for problem based charting.  Patient discussed with Dr. Daryll Drown

## 2018-03-29 NOTE — Patient Instructions (Addendum)
Jeremy [name], your A1C and diabetes is doing better but we still have some work to do.  I have written for a new meter.  Please check your blood sugars and come back in two weeks to adjust your insulin.  For now we have increased your victoza to 1.8mg  daily.  We will check your cholesterol today and I will call you with the results.  I have referred you for a diabetic eye exam and to a podiatrist to examine your feet and treat your toenail fungus.

## 2018-03-30 ENCOUNTER — Telehealth: Payer: Self-pay | Admitting: Internal Medicine

## 2018-03-30 ENCOUNTER — Telehealth: Payer: Self-pay | Admitting: *Deleted

## 2018-03-30 DIAGNOSIS — Z23 Encounter for immunization: Secondary | ICD-10-CM | POA: Insufficient documentation

## 2018-03-30 DIAGNOSIS — N184 Chronic kidney disease, stage 4 (severe): Secondary | ICD-10-CM | POA: Diagnosis not present

## 2018-03-30 LAB — LIPID PANEL
CHOL/HDL RATIO: 5 ratio (ref 0.0–5.0)
Cholesterol, Total: 129 mg/dL (ref 100–199)
HDL: 26 mg/dL — AB (ref >39)
LDL Calculated: 63 mg/dL (ref 0–99)
TRIGLYCERIDES: 201 mg/dL — AB (ref 0–149)
VLDL Cholesterol Cal: 40 mg/dL (ref 5–40)

## 2018-03-30 MED FILL — VICTOZA 18 MG/3 ML INJECT P: 18 | 30 days supply | Qty: 9 | Fill #0

## 2018-03-30 NOTE — Telephone Encounter (Signed)
went over lipid panel results with pt.  Pt has not yet picked up his meter but will get it today.  Reemphasized importance of checking cbgs.  Asked for dental resources gave names of dentists accepting medicaid.

## 2018-03-30 NOTE — Assessment & Plan Note (Signed)
Pt still with ongoing symptoms suggestive of sleep apnea, daytime sleepiness and fatigue, snoring.  He has had several sleep studies ordered for him already.  None completed.    -will order split night study for pt today

## 2018-03-30 NOTE — Assessment & Plan Note (Signed)
Lab Results  Component Value Date   CHOL 129 03/29/2018   HDL 26 (L) 03/29/2018   LDLCALC 63 03/29/2018   TRIG 201 (H) 03/29/2018   CHOLHDL 5.0 03/29/2018   Patient has been losing weight with the victoza, he remains on atorvastatin 80mg  daily.  Today his LDL was much improved at 63. TG's slightly elevated but not a fasting result  -will continue atorvastatin 80mg  daily -continue to encourage weight loss

## 2018-03-30 NOTE — Assessment & Plan Note (Signed)
Lab Results  Component Value Date   HGBA1C 9.6 (A) 03/29/2018   Patient still not taking his blood sugars, also lost his meter about one week ago.  I am concerned he may be having lows at times possibly in the morning having some jittery feelings, and sweating.  His A1C is improved today.  He admits he does not always take his mealtime insulin but has been compliant with victoza and lantus faithfully.  He also takes glipizide 47m daily.    -will increase victoza to 1.868mdaily -will decrease lantus to 36 units daily -ordered pt new meter kit with strips and lancets -gave cbg log sheet follow up in 2 weeks to adjust insulin

## 2018-03-30 NOTE — Assessment & Plan Note (Signed)
Given pts diabetes he is in need of pneumococcal vaccine PPSV23.    -administer 1st dose today

## 2018-03-30 NOTE — Assessment & Plan Note (Signed)
Pt reports ongoing onychomycosis of toenails.  He has tried several therapies in the past without success.  He also would like toenails to be clipped they are hard long and brittle and he is worried about breaking the skin due to his uncontrolled diabetes.  -will refer to podiatry for nail care and to address dermatophyte onychomycosis

## 2018-03-30 NOTE — Telephone Encounter (Signed)
SPOKE WITH PATIENT REGARDING HIS SLEEP STUDY REFERRAL/ HIS APPOINTMENT IS FOR AUGUST 20.019 TO ARRIVE 8:00PM.  THE OFFICE IS TO MAIL HIM INFORMATION REGARDING THE PROCEDURE. PATIENT WAS ABLE TO REPEAT TO ME THIS INFORMATION.

## 2018-03-30 NOTE — Telephone Encounter (Addendum)
Information was sent to [facility] for PA for Victoza.  Awaiting decision from [facility] . Confirmation # [id number] Nelva Nay, RN 03/30/2018 9:16 AM.  Prior Authorization was approved 03/30/2018 thru 03/30/2019.  [facility] was called and notified of.  Sander Nephew, RN 03/30/2018 9:49 AM.

## 2018-03-31 DIAGNOSIS — N184 Chronic kidney disease, stage 4 (severe): Secondary | ICD-10-CM | POA: Diagnosis not present

## 2018-03-31 MED FILL — ATORVASTATIN CALCIUM 80 MG: 80 | 30 days supply | Qty: 30 | Fill #3

## 2018-03-31 MED FILL — hydrALAZINE HCL 100 MG TABS: 100 | 30 days supply | Qty: 90 | Fill #6

## 2018-03-31 MED FILL — cloNIDine HCL 0.3 MG TABS: 0.3 | 30 days supply | Qty: 60 | Fill #1

## 2018-03-31 NOTE — Progress Notes (Signed)
Internal Medicine Clinic Attending  Case discussed with Dr. Winfrey  at the time of the visit.  We reviewed the resident's history and exam and pertinent patient test results.  I agree with the assessment, diagnosis, and plan of care documented in the resident's note.  

## 2018-04-01 DIAGNOSIS — N184 Chronic kidney disease, stage 4 (severe): Secondary | ICD-10-CM | POA: Diagnosis not present

## 2018-04-02 DIAGNOSIS — N184 Chronic kidney disease, stage 4 (severe): Secondary | ICD-10-CM | POA: Diagnosis not present

## 2018-04-03 DIAGNOSIS — N184 Chronic kidney disease, stage 4 (severe): Secondary | ICD-10-CM | POA: Diagnosis not present

## 2018-04-04 DIAGNOSIS — N184 Chronic kidney disease, stage 4 (severe): Secondary | ICD-10-CM | POA: Diagnosis not present

## 2018-04-05 DIAGNOSIS — N184 Chronic kidney disease, stage 4 (severe): Secondary | ICD-10-CM | POA: Diagnosis not present

## 2018-04-06 DIAGNOSIS — N184 Chronic kidney disease, stage 4 (severe): Secondary | ICD-10-CM | POA: Diagnosis not present

## 2018-04-06 DIAGNOSIS — Z5181 Encounter for therapeutic drug level monitoring: Secondary | ICD-10-CM | POA: Diagnosis not present

## 2018-04-06 MED FILL — CARVEDILOL 25 MG TABLET: 25 | 30 days supply | Qty: 60 | Fill #3

## 2018-04-06 MED FILL — raNITIdine HCL 150 MG TABS: 150 | 30 days supply | Qty: 60 | Fill #4

## 2018-04-06 MED FILL — TACROLIMUS 1 MG CAPSULE: 1 | 28 days supply | Qty: 300 | Fill #3

## 2018-04-07 DIAGNOSIS — N184 Chronic kidney disease, stage 4 (severe): Secondary | ICD-10-CM | POA: Diagnosis not present

## 2018-04-08 DIAGNOSIS — N184 Chronic kidney disease, stage 4 (severe): Secondary | ICD-10-CM | POA: Diagnosis not present

## 2018-04-09 DIAGNOSIS — N184 Chronic kidney disease, stage 4 (severe): Secondary | ICD-10-CM | POA: Diagnosis not present

## 2018-04-10 DIAGNOSIS — N184 Chronic kidney disease, stage 4 (severe): Secondary | ICD-10-CM | POA: Diagnosis not present

## 2018-04-11 DIAGNOSIS — N184 Chronic kidney disease, stage 4 (severe): Secondary | ICD-10-CM | POA: Diagnosis not present

## 2018-04-12 DIAGNOSIS — N184 Chronic kidney disease, stage 4 (severe): Secondary | ICD-10-CM | POA: Diagnosis not present

## 2018-04-13 DIAGNOSIS — N184 Chronic kidney disease, stage 4 (severe): Secondary | ICD-10-CM | POA: Diagnosis not present

## 2018-04-14 DIAGNOSIS — N184 Chronic kidney disease, stage 4 (severe): Secondary | ICD-10-CM | POA: Diagnosis not present

## 2018-04-15 DIAGNOSIS — N184 Chronic kidney disease, stage 4 (severe): Secondary | ICD-10-CM | POA: Diagnosis not present

## 2018-04-16 DIAGNOSIS — N184 Chronic kidney disease, stage 4 (severe): Secondary | ICD-10-CM | POA: Diagnosis not present

## 2018-04-16 MED FILL — glipiZIDE 5 MG TABS: 5 | 90 days supply | Qty: 90 | Fill #1

## 2018-04-17 DIAGNOSIS — N184 Chronic kidney disease, stage 4 (severe): Secondary | ICD-10-CM | POA: Diagnosis not present

## 2018-04-18 DIAGNOSIS — N184 Chronic kidney disease, stage 4 (severe): Secondary | ICD-10-CM | POA: Diagnosis not present

## 2018-04-19 DIAGNOSIS — N184 Chronic kidney disease, stage 4 (severe): Secondary | ICD-10-CM | POA: Diagnosis not present

## 2018-04-20 DIAGNOSIS — N184 Chronic kidney disease, stage 4 (severe): Secondary | ICD-10-CM | POA: Diagnosis not present

## 2018-04-21 DIAGNOSIS — N184 Chronic kidney disease, stage 4 (severe): Secondary | ICD-10-CM | POA: Diagnosis not present

## 2018-04-22 DIAGNOSIS — N2581 Secondary hyperparathyroidism of renal origin: Secondary | ICD-10-CM | POA: Diagnosis not present

## 2018-04-22 DIAGNOSIS — E1129 Type 2 diabetes mellitus with other diabetic kidney complication: Secondary | ICD-10-CM | POA: Diagnosis not present

## 2018-04-22 DIAGNOSIS — I5022 Chronic systolic (congestive) heart failure: Secondary | ICD-10-CM | POA: Diagnosis not present

## 2018-04-22 DIAGNOSIS — Z6835 Body mass index (BMI) 35.0-35.9, adult: Secondary | ICD-10-CM | POA: Diagnosis not present

## 2018-04-22 DIAGNOSIS — E785 Hyperlipidemia, unspecified: Secondary | ICD-10-CM | POA: Diagnosis not present

## 2018-04-22 DIAGNOSIS — N184 Chronic kidney disease, stage 4 (severe): Secondary | ICD-10-CM | POA: Diagnosis not present

## 2018-04-22 DIAGNOSIS — D631 Anemia in chronic kidney disease: Secondary | ICD-10-CM | POA: Diagnosis not present

## 2018-04-22 DIAGNOSIS — I129 Hypertensive chronic kidney disease with stage 1 through stage 4 chronic kidney disease, or unspecified chronic kidney disease: Secondary | ICD-10-CM | POA: Diagnosis not present

## 2018-04-22 DIAGNOSIS — N052 Unspecified nephritic syndrome with diffuse membranous glomerulonephritis: Secondary | ICD-10-CM | POA: Diagnosis not present

## 2018-04-23 ENCOUNTER — Ambulatory Visit: Payer: Self-pay | Admitting: Podiatry

## 2018-04-23 DIAGNOSIS — N184 Chronic kidney disease, stage 4 (severe): Secondary | ICD-10-CM | POA: Diagnosis not present

## 2018-04-23 DIAGNOSIS — H52211 Irregular astigmatism, right eye: Secondary | ICD-10-CM | POA: Diagnosis not present

## 2018-04-23 DIAGNOSIS — E119 Type 2 diabetes mellitus without complications: Secondary | ICD-10-CM | POA: Diagnosis not present

## 2018-04-23 DIAGNOSIS — H35033 Hypertensive retinopathy, bilateral: Secondary | ICD-10-CM | POA: Diagnosis not present

## 2018-04-23 DIAGNOSIS — H11139 Conjunctival pigmentations, unspecified eye: Secondary | ICD-10-CM | POA: Diagnosis not present

## 2018-04-23 LAB — HM DIABETES EYE EXAM

## 2018-04-24 DIAGNOSIS — N184 Chronic kidney disease, stage 4 (severe): Secondary | ICD-10-CM | POA: Diagnosis not present

## 2018-04-25 DIAGNOSIS — N184 Chronic kidney disease, stage 4 (severe): Secondary | ICD-10-CM | POA: Diagnosis not present

## 2018-04-26 DIAGNOSIS — N184 Chronic kidney disease, stage 4 (severe): Secondary | ICD-10-CM | POA: Diagnosis not present

## 2018-04-27 ENCOUNTER — Encounter (HOSPITAL_BASED_OUTPATIENT_CLINIC_OR_DEPARTMENT_OTHER): Payer: Medicaid Other

## 2018-04-27 DIAGNOSIS — N184 Chronic kidney disease, stage 4 (severe): Secondary | ICD-10-CM | POA: Diagnosis not present

## 2018-04-28 DIAGNOSIS — N184 Chronic kidney disease, stage 4 (severe): Secondary | ICD-10-CM | POA: Diagnosis not present

## 2018-04-29 DIAGNOSIS — N184 Chronic kidney disease, stage 4 (severe): Secondary | ICD-10-CM | POA: Diagnosis not present

## 2018-04-30 DIAGNOSIS — N184 Chronic kidney disease, stage 4 (severe): Secondary | ICD-10-CM | POA: Diagnosis not present

## 2018-04-30 MED FILL — ATORVASTATIN CALCIUM 80 MG: 80 | 30 days supply | Qty: 30 | Fill #4

## 2018-04-30 MED FILL — FUROSEMIDE 80 MG TABLET: 80 | 30 days supply | Qty: 60 | Fill #2

## 2018-05-01 DIAGNOSIS — N184 Chronic kidney disease, stage 4 (severe): Secondary | ICD-10-CM | POA: Diagnosis not present

## 2018-05-02 DIAGNOSIS — N184 Chronic kidney disease, stage 4 (severe): Secondary | ICD-10-CM | POA: Diagnosis not present

## 2018-05-03 DIAGNOSIS — N184 Chronic kidney disease, stage 4 (severe): Secondary | ICD-10-CM | POA: Diagnosis not present

## 2018-05-04 DIAGNOSIS — N184 Chronic kidney disease, stage 4 (severe): Secondary | ICD-10-CM | POA: Diagnosis not present

## 2018-05-05 DIAGNOSIS — N184 Chronic kidney disease, stage 4 (severe): Secondary | ICD-10-CM | POA: Diagnosis not present

## 2018-05-05 MED FILL — cloNIDine HCL 0.3 MG TABS: 0.3 | 30 days supply | Qty: 60 | Fill #2

## 2018-05-05 MED FILL — AMLODIPINE BESYLATE 10 MG T: 10 | 30 days supply | Qty: 30 | Fill #0

## 2018-05-06 DIAGNOSIS — N184 Chronic kidney disease, stage 4 (severe): Secondary | ICD-10-CM | POA: Diagnosis not present

## 2018-05-07 DIAGNOSIS — N184 Chronic kidney disease, stage 4 (severe): Secondary | ICD-10-CM | POA: Diagnosis not present

## 2018-05-08 DIAGNOSIS — N184 Chronic kidney disease, stage 4 (severe): Secondary | ICD-10-CM | POA: Diagnosis not present

## 2018-05-09 DIAGNOSIS — N184 Chronic kidney disease, stage 4 (severe): Secondary | ICD-10-CM | POA: Diagnosis not present

## 2018-05-10 DIAGNOSIS — N184 Chronic kidney disease, stage 4 (severe): Secondary | ICD-10-CM | POA: Diagnosis not present

## 2018-05-11 DIAGNOSIS — N184 Chronic kidney disease, stage 4 (severe): Secondary | ICD-10-CM | POA: Diagnosis not present

## 2018-05-12 DIAGNOSIS — N184 Chronic kidney disease, stage 4 (severe): Secondary | ICD-10-CM | POA: Diagnosis not present

## 2018-05-13 DIAGNOSIS — N184 Chronic kidney disease, stage 4 (severe): Secondary | ICD-10-CM | POA: Diagnosis not present

## 2018-05-14 DIAGNOSIS — N184 Chronic kidney disease, stage 4 (severe): Secondary | ICD-10-CM | POA: Diagnosis not present

## 2018-05-15 DIAGNOSIS — N184 Chronic kidney disease, stage 4 (severe): Secondary | ICD-10-CM | POA: Diagnosis not present

## 2018-05-16 DIAGNOSIS — N184 Chronic kidney disease, stage 4 (severe): Secondary | ICD-10-CM | POA: Diagnosis not present

## 2018-05-17 DIAGNOSIS — N184 Chronic kidney disease, stage 4 (severe): Secondary | ICD-10-CM | POA: Diagnosis not present

## 2018-05-17 MED FILL — NovoLOG 100 UNIT/ML SOLN: 100 | 22 days supply | Qty: 10 | Fill #2

## 2018-05-17 MED FILL — UNIFINE PENTIPS 32GX5/32: 32G X 4 MM | 30 days supply | Qty: 30 | Fill #1

## 2018-05-17 MED FILL — ULTICARE INS 0.5 ML 30GX1/2: 30G X 1/2" | 25 days supply | Qty: 100 | Fill #4

## 2018-05-17 MED FILL — UNIFINE PENTIPS 32GX5/32": 32G X 4 MM | 30 days supply | Qty: 30 | Fill #1

## 2018-05-17 MED FILL — raNITIdine HCL 150 MG TABS: 150 | 30 days supply | Qty: 60 | Fill #5

## 2018-05-17 MED FILL — VICTOZA 18 MG/3 ML INJECT P: 18 | 30 days supply | Qty: 9 | Fill #1

## 2018-05-18 DIAGNOSIS — N184 Chronic kidney disease, stage 4 (severe): Secondary | ICD-10-CM | POA: Diagnosis not present

## 2018-05-19 ENCOUNTER — Other Ambulatory Visit (HOSPITAL_COMMUNITY): Payer: Self-pay | Admitting: Cardiology

## 2018-05-19 DIAGNOSIS — I5042 Chronic combined systolic (congestive) and diastolic (congestive) heart failure: Secondary | ICD-10-CM

## 2018-05-19 DIAGNOSIS — N184 Chronic kidney disease, stage 4 (severe): Secondary | ICD-10-CM | POA: Diagnosis not present

## 2018-05-19 MED FILL — CARVEDILOL 25 MG TABLET: 25 | 30 days supply | Qty: 60 | Fill #4

## 2018-05-20 DIAGNOSIS — N184 Chronic kidney disease, stage 4 (severe): Secondary | ICD-10-CM | POA: Diagnosis not present

## 2018-05-20 MED FILL — hydrALAZINE HCL 100 MG TABS: 100 | 30 days supply | Qty: 90 | Fill #0

## 2018-05-21 DIAGNOSIS — N184 Chronic kidney disease, stage 4 (severe): Secondary | ICD-10-CM | POA: Diagnosis not present

## 2018-05-22 DIAGNOSIS — N184 Chronic kidney disease, stage 4 (severe): Secondary | ICD-10-CM | POA: Diagnosis not present

## 2018-05-23 DIAGNOSIS — N184 Chronic kidney disease, stage 4 (severe): Secondary | ICD-10-CM | POA: Diagnosis not present

## 2018-05-24 DIAGNOSIS — N184 Chronic kidney disease, stage 4 (severe): Secondary | ICD-10-CM | POA: Diagnosis not present

## 2018-05-25 DIAGNOSIS — N184 Chronic kidney disease, stage 4 (severe): Secondary | ICD-10-CM | POA: Diagnosis not present

## 2018-05-26 ENCOUNTER — Ambulatory Visit (HOSPITAL_BASED_OUTPATIENT_CLINIC_OR_DEPARTMENT_OTHER): Payer: Medicaid Other | Attending: Internal Medicine

## 2018-05-26 DIAGNOSIS — N184 Chronic kidney disease, stage 4 (severe): Secondary | ICD-10-CM | POA: Diagnosis not present

## 2018-05-27 ENCOUNTER — Other Ambulatory Visit (HOSPITAL_COMMUNITY): Payer: Self-pay | Admitting: Internal Medicine

## 2018-05-27 DIAGNOSIS — N184 Chronic kidney disease, stage 4 (severe): Secondary | ICD-10-CM | POA: Diagnosis not present

## 2018-05-27 MED FILL — ATORVASTATIN CALCIUM 80 MG: 80 | 30 days supply | Qty: 30 | Fill #5

## 2018-05-27 MED FILL — FUROSEMIDE 80 MG TABLET: 80 | 30 days supply | Qty: 60 | Fill #3

## 2018-05-28 DIAGNOSIS — N184 Chronic kidney disease, stage 4 (severe): Secondary | ICD-10-CM | POA: Diagnosis not present

## 2018-05-28 MED FILL — POTASSIUM CL ER 20 MEQ TAB: 20 | 90 days supply | Qty: 90 | Fill #0

## 2018-05-29 DIAGNOSIS — N184 Chronic kidney disease, stage 4 (severe): Secondary | ICD-10-CM | POA: Diagnosis not present

## 2018-05-30 DIAGNOSIS — N184 Chronic kidney disease, stage 4 (severe): Secondary | ICD-10-CM | POA: Diagnosis not present

## 2018-05-31 DIAGNOSIS — N184 Chronic kidney disease, stage 4 (severe): Secondary | ICD-10-CM | POA: Diagnosis not present

## 2018-06-01 DIAGNOSIS — N184 Chronic kidney disease, stage 4 (severe): Secondary | ICD-10-CM | POA: Diagnosis not present

## 2018-06-02 DIAGNOSIS — N184 Chronic kidney disease, stage 4 (severe): Secondary | ICD-10-CM | POA: Diagnosis not present

## 2018-06-03 DIAGNOSIS — N184 Chronic kidney disease, stage 4 (severe): Secondary | ICD-10-CM | POA: Diagnosis not present

## 2018-06-04 DIAGNOSIS — N184 Chronic kidney disease, stage 4 (severe): Secondary | ICD-10-CM | POA: Diagnosis not present

## 2018-06-04 MED FILL — NovoLOG 100 UNIT/ML SOLN: 100 | 22 days supply | Qty: 10 | Fill #3

## 2018-06-04 MED FILL — TACROLIMUS 1 MG CAPSULE: 1 | 28 days supply | Qty: 300 | Fill #4

## 2018-06-04 MED FILL — cloNIDine HCL 0.3 MG TABS: 0.3 | 30 days supply | Qty: 60 | Fill #3

## 2018-06-04 MED FILL — AMLODIPINE BESYLATE 10 MG T: 10 | 30 days supply | Qty: 30 | Fill #1

## 2018-06-04 MED FILL — ULTICARE INS 0.5 ML 30GX1/2: 30G X 1/2" | 25 days supply | Qty: 100 | Fill #5

## 2018-06-04 MED FILL — LANTUS 100 UNITS/ML VIAL: 100 | 27 days supply | Qty: 10 | Fill #0

## 2018-06-05 DIAGNOSIS — N184 Chronic kidney disease, stage 4 (severe): Secondary | ICD-10-CM | POA: Diagnosis not present

## 2018-06-06 DIAGNOSIS — N184 Chronic kidney disease, stage 4 (severe): Secondary | ICD-10-CM | POA: Diagnosis not present

## 2018-06-07 DIAGNOSIS — N184 Chronic kidney disease, stage 4 (severe): Secondary | ICD-10-CM | POA: Diagnosis not present

## 2018-06-08 DIAGNOSIS — N184 Chronic kidney disease, stage 4 (severe): Secondary | ICD-10-CM | POA: Diagnosis not present

## 2018-06-09 DIAGNOSIS — N184 Chronic kidney disease, stage 4 (severe): Secondary | ICD-10-CM | POA: Diagnosis not present

## 2018-06-10 DIAGNOSIS — N184 Chronic kidney disease, stage 4 (severe): Secondary | ICD-10-CM | POA: Diagnosis not present

## 2018-06-11 DIAGNOSIS — N184 Chronic kidney disease, stage 4 (severe): Secondary | ICD-10-CM | POA: Diagnosis not present

## 2018-06-12 DIAGNOSIS — N184 Chronic kidney disease, stage 4 (severe): Secondary | ICD-10-CM | POA: Diagnosis not present

## 2018-06-13 DIAGNOSIS — N184 Chronic kidney disease, stage 4 (severe): Secondary | ICD-10-CM | POA: Diagnosis not present

## 2018-06-14 DIAGNOSIS — N184 Chronic kidney disease, stage 4 (severe): Secondary | ICD-10-CM | POA: Diagnosis not present

## 2018-06-15 DIAGNOSIS — N184 Chronic kidney disease, stage 4 (severe): Secondary | ICD-10-CM | POA: Diagnosis not present

## 2018-06-16 DIAGNOSIS — N184 Chronic kidney disease, stage 4 (severe): Secondary | ICD-10-CM | POA: Diagnosis not present

## 2018-06-17 DIAGNOSIS — N184 Chronic kidney disease, stage 4 (severe): Secondary | ICD-10-CM | POA: Diagnosis not present

## 2018-06-18 DIAGNOSIS — N184 Chronic kidney disease, stage 4 (severe): Secondary | ICD-10-CM | POA: Diagnosis not present

## 2018-06-18 MED FILL — CARVEDILOL 25 MG TABLET: 25 | 30 days supply | Qty: 60 | Fill #5

## 2018-06-18 MED FILL — raNITIdine HCL 150 MG TABS: 150 | 30 days supply | Qty: 60 | Fill #6

## 2018-06-19 DIAGNOSIS — N184 Chronic kidney disease, stage 4 (severe): Secondary | ICD-10-CM | POA: Diagnosis not present

## 2018-06-20 DIAGNOSIS — N184 Chronic kidney disease, stage 4 (severe): Secondary | ICD-10-CM | POA: Diagnosis not present

## 2018-06-21 DIAGNOSIS — N184 Chronic kidney disease, stage 4 (severe): Secondary | ICD-10-CM | POA: Diagnosis not present

## 2018-06-22 DIAGNOSIS — N184 Chronic kidney disease, stage 4 (severe): Secondary | ICD-10-CM | POA: Diagnosis not present

## 2018-06-23 DIAGNOSIS — N184 Chronic kidney disease, stage 4 (severe): Secondary | ICD-10-CM | POA: Diagnosis not present

## 2018-06-24 DIAGNOSIS — N184 Chronic kidney disease, stage 4 (severe): Secondary | ICD-10-CM | POA: Diagnosis not present

## 2018-06-25 DIAGNOSIS — N184 Chronic kidney disease, stage 4 (severe): Secondary | ICD-10-CM | POA: Diagnosis not present

## 2018-06-25 MED FILL — hydrALAZINE HCL 100 MG TABS: 100 | 30 days supply | Qty: 90 | Fill #1

## 2018-06-25 MED FILL — ATORVASTATIN CALCIUM 80 MG: 80 | 30 days supply | Qty: 30 | Fill #6

## 2018-06-26 DIAGNOSIS — N184 Chronic kidney disease, stage 4 (severe): Secondary | ICD-10-CM | POA: Diagnosis not present

## 2018-06-27 DIAGNOSIS — N184 Chronic kidney disease, stage 4 (severe): Secondary | ICD-10-CM | POA: Diagnosis not present

## 2018-06-28 ENCOUNTER — Ambulatory Visit: Payer: Self-pay | Admitting: Podiatry

## 2018-06-28 DIAGNOSIS — N184 Chronic kidney disease, stage 4 (severe): Secondary | ICD-10-CM | POA: Diagnosis not present

## 2018-06-29 DIAGNOSIS — N184 Chronic kidney disease, stage 4 (severe): Secondary | ICD-10-CM | POA: Diagnosis not present

## 2018-06-30 DIAGNOSIS — N184 Chronic kidney disease, stage 4 (severe): Secondary | ICD-10-CM | POA: Diagnosis not present

## 2018-07-01 DIAGNOSIS — Z6835 Body mass index (BMI) 35.0-35.9, adult: Secondary | ICD-10-CM | POA: Diagnosis not present

## 2018-07-01 DIAGNOSIS — E1129 Type 2 diabetes mellitus with other diabetic kidney complication: Secondary | ICD-10-CM | POA: Diagnosis not present

## 2018-07-01 DIAGNOSIS — N2581 Secondary hyperparathyroidism of renal origin: Secondary | ICD-10-CM | POA: Diagnosis not present

## 2018-07-01 DIAGNOSIS — I129 Hypertensive chronic kidney disease with stage 1 through stage 4 chronic kidney disease, or unspecified chronic kidney disease: Secondary | ICD-10-CM | POA: Diagnosis not present

## 2018-07-01 DIAGNOSIS — N052 Unspecified nephritic syndrome with diffuse membranous glomerulonephritis: Secondary | ICD-10-CM | POA: Diagnosis not present

## 2018-07-01 DIAGNOSIS — I5022 Chronic systolic (congestive) heart failure: Secondary | ICD-10-CM | POA: Diagnosis not present

## 2018-07-01 DIAGNOSIS — N184 Chronic kidney disease, stage 4 (severe): Secondary | ICD-10-CM | POA: Diagnosis not present

## 2018-07-01 DIAGNOSIS — E785 Hyperlipidemia, unspecified: Secondary | ICD-10-CM | POA: Diagnosis not present

## 2018-07-02 DIAGNOSIS — N184 Chronic kidney disease, stage 4 (severe): Secondary | ICD-10-CM | POA: Diagnosis not present

## 2018-07-02 MED FILL — FUROSEMIDE 80 MG TABLET: 80 | 30 days supply | Qty: 60 | Fill #4

## 2018-07-03 DIAGNOSIS — N184 Chronic kidney disease, stage 4 (severe): Secondary | ICD-10-CM | POA: Diagnosis not present

## 2018-07-04 DIAGNOSIS — N184 Chronic kidney disease, stage 4 (severe): Secondary | ICD-10-CM | POA: Diagnosis not present

## 2018-07-05 ENCOUNTER — Other Ambulatory Visit (HOSPITAL_COMMUNITY): Payer: Self-pay | Admitting: Cardiology

## 2018-07-05 DIAGNOSIS — N184 Chronic kidney disease, stage 4 (severe): Secondary | ICD-10-CM | POA: Diagnosis not present

## 2018-07-05 MED FILL — cloNIDine HCL 0.3 MG TABS: 0.3 | 30 days supply | Qty: 60 | Fill #0

## 2018-07-06 DIAGNOSIS — N184 Chronic kidney disease, stage 4 (severe): Secondary | ICD-10-CM | POA: Diagnosis not present

## 2018-07-07 DIAGNOSIS — N184 Chronic kidney disease, stage 4 (severe): Secondary | ICD-10-CM | POA: Diagnosis not present

## 2018-07-08 DIAGNOSIS — N184 Chronic kidney disease, stage 4 (severe): Secondary | ICD-10-CM | POA: Diagnosis not present

## 2018-07-09 DIAGNOSIS — N184 Chronic kidney disease, stage 4 (severe): Secondary | ICD-10-CM | POA: Diagnosis not present

## 2018-07-10 DIAGNOSIS — N184 Chronic kidney disease, stage 4 (severe): Secondary | ICD-10-CM | POA: Diagnosis not present

## 2018-07-11 DIAGNOSIS — N184 Chronic kidney disease, stage 4 (severe): Secondary | ICD-10-CM | POA: Diagnosis not present

## 2018-07-12 DIAGNOSIS — N184 Chronic kidney disease, stage 4 (severe): Secondary | ICD-10-CM | POA: Diagnosis not present

## 2018-07-13 ENCOUNTER — Ambulatory Visit: Payer: Self-pay | Admitting: Podiatry

## 2018-07-13 DIAGNOSIS — N184 Chronic kidney disease, stage 4 (severe): Secondary | ICD-10-CM | POA: Diagnosis not present

## 2018-07-14 ENCOUNTER — Other Ambulatory Visit: Payer: Self-pay | Admitting: Internal Medicine

## 2018-07-14 DIAGNOSIS — E1121 Type 2 diabetes mellitus with diabetic nephropathy: Secondary | ICD-10-CM

## 2018-07-14 DIAGNOSIS — N184 Chronic kidney disease, stage 4 (severe): Secondary | ICD-10-CM | POA: Diagnosis not present

## 2018-07-14 MED FILL — glipiZIDE 5 MG TABS: 5 | 90 days supply | Qty: 90 | Fill #2

## 2018-07-14 MED FILL — LANTUS 100 UNITS/ML VIAL: 100 | 27 days supply | Qty: 10 | Fill #1

## 2018-07-14 MED FILL — UNIFINE PENTIPS 32GX5/32": 32G X 4 MM | 30 days supply | Qty: 30 | Fill #2

## 2018-07-14 MED FILL — UNIFINE PENTIPS 32GX5/32: 32G X 4 MM | 30 days supply | Qty: 30 | Fill #2

## 2018-07-14 MED FILL — ULTICARE INS 0.5 ML 30GX1/2: 30G X 1/2" | 25 days supply | Qty: 100 | Fill #6

## 2018-07-14 MED FILL — VICTOZA 18 MG/3 ML INJECT P: 18 | 30 days supply | Qty: 9 | Fill #2

## 2018-07-14 NOTE — Telephone Encounter (Signed)
refilled 

## 2018-07-15 DIAGNOSIS — N184 Chronic kidney disease, stage 4 (severe): Secondary | ICD-10-CM | POA: Diagnosis not present

## 2018-07-15 MED FILL — TACROLIMUS 1 MG CAPSULE: 1 | 28 days supply | Qty: 300 | Fill #5

## 2018-07-15 MED FILL — NovoLOG 100 UNIT/ML SOLN: 100 | 22 days supply | Qty: 10 | Fill #0

## 2018-07-16 DIAGNOSIS — N184 Chronic kidney disease, stage 4 (severe): Secondary | ICD-10-CM | POA: Diagnosis not present

## 2018-07-17 DIAGNOSIS — N184 Chronic kidney disease, stage 4 (severe): Secondary | ICD-10-CM | POA: Diagnosis not present

## 2018-07-18 DIAGNOSIS — N184 Chronic kidney disease, stage 4 (severe): Secondary | ICD-10-CM | POA: Diagnosis not present

## 2018-07-19 DIAGNOSIS — N184 Chronic kidney disease, stage 4 (severe): Secondary | ICD-10-CM | POA: Diagnosis not present

## 2018-07-19 MED FILL — CARVEDILOL 25 MG TABLET: 25 | 30 days supply | Qty: 60 | Fill #6

## 2018-07-20 DIAGNOSIS — N184 Chronic kidney disease, stage 4 (severe): Secondary | ICD-10-CM | POA: Diagnosis not present

## 2018-07-21 ENCOUNTER — Other Ambulatory Visit: Payer: Self-pay | Admitting: Internal Medicine

## 2018-07-21 ENCOUNTER — Encounter: Payer: Self-pay | Admitting: Internal Medicine

## 2018-07-21 DIAGNOSIS — N184 Chronic kidney disease, stage 4 (severe): Secondary | ICD-10-CM | POA: Diagnosis not present

## 2018-07-21 DIAGNOSIS — E1121 Type 2 diabetes mellitus with diabetic nephropathy: Secondary | ICD-10-CM

## 2018-07-21 DIAGNOSIS — Z794 Long term (current) use of insulin: Secondary | ICD-10-CM

## 2018-07-21 MED ORDER — FAMOTIDINE 10 MG PO TABS: 10.0000 mg | ORAL_TABLET | Freq: Every day | ORAL | 3 refills | Status: DC | PRN

## 2018-07-21 MED ORDER — BLOOD GLUCOSE MONITOR KIT: PACK | 0 refills | Status: DC

## 2018-07-21 MED FILL — ACCU-CHEK FASTCLIX LANCETS: 26 days supply | Qty: 102 | Fill #1

## 2018-07-21 MED FILL — ATORVASTATIN CALCIUM 80 MG: 80 | 30 days supply | Qty: 30 | Fill #7

## 2018-07-21 NOTE — Telephone Encounter (Signed)
refilled 

## 2018-07-22 DIAGNOSIS — N184 Chronic kidney disease, stage 4 (severe): Secondary | ICD-10-CM | POA: Diagnosis not present

## 2018-07-23 DIAGNOSIS — N184 Chronic kidney disease, stage 4 (severe): Secondary | ICD-10-CM | POA: Diagnosis not present

## 2018-07-24 DIAGNOSIS — N184 Chronic kidney disease, stage 4 (severe): Secondary | ICD-10-CM | POA: Diagnosis not present

## 2018-07-25 DIAGNOSIS — N184 Chronic kidney disease, stage 4 (severe): Secondary | ICD-10-CM | POA: Diagnosis not present

## 2018-07-26 DIAGNOSIS — N184 Chronic kidney disease, stage 4 (severe): Secondary | ICD-10-CM | POA: Diagnosis not present

## 2018-07-27 DIAGNOSIS — N184 Chronic kidney disease, stage 4 (severe): Secondary | ICD-10-CM | POA: Diagnosis not present

## 2018-07-28 DIAGNOSIS — N184 Chronic kidney disease, stage 4 (severe): Secondary | ICD-10-CM | POA: Diagnosis not present

## 2018-07-29 DIAGNOSIS — N184 Chronic kidney disease, stage 4 (severe): Secondary | ICD-10-CM | POA: Diagnosis not present

## 2018-07-30 DIAGNOSIS — N184 Chronic kidney disease, stage 4 (severe): Secondary | ICD-10-CM | POA: Diagnosis not present

## 2018-07-31 DIAGNOSIS — N184 Chronic kidney disease, stage 4 (severe): Secondary | ICD-10-CM | POA: Diagnosis not present

## 2018-08-01 DIAGNOSIS — N184 Chronic kidney disease, stage 4 (severe): Secondary | ICD-10-CM | POA: Diagnosis not present

## 2018-08-02 DIAGNOSIS — N184 Chronic kidney disease, stage 4 (severe): Secondary | ICD-10-CM | POA: Diagnosis not present

## 2018-08-02 MED FILL — hydrALAZINE HCL 100 MG TABS: 100 | 30 days supply | Qty: 90 | Fill #2

## 2018-08-02 MED FILL — FUROSEMIDE 80 MG TABLET: 80 | 30 days supply | Qty: 60 | Fill #5

## 2018-08-03 DIAGNOSIS — N184 Chronic kidney disease, stage 4 (severe): Secondary | ICD-10-CM | POA: Diagnosis not present

## 2018-08-03 MED FILL — cloNIDine HCL 0.3 MG TABS: 0.3 | 30 days supply | Qty: 60 | Fill #1

## 2018-08-04 ENCOUNTER — Ambulatory Visit: Payer: Self-pay | Admitting: Podiatry

## 2018-08-04 DIAGNOSIS — N184 Chronic kidney disease, stage 4 (severe): Secondary | ICD-10-CM | POA: Diagnosis not present

## 2018-08-05 DIAGNOSIS — N184 Chronic kidney disease, stage 4 (severe): Secondary | ICD-10-CM | POA: Diagnosis not present

## 2018-08-06 DIAGNOSIS — N184 Chronic kidney disease, stage 4 (severe): Secondary | ICD-10-CM | POA: Diagnosis not present

## 2018-08-07 DIAGNOSIS — N184 Chronic kidney disease, stage 4 (severe): Secondary | ICD-10-CM | POA: Diagnosis not present

## 2018-08-08 DIAGNOSIS — N184 Chronic kidney disease, stage 4 (severe): Secondary | ICD-10-CM | POA: Diagnosis not present

## 2018-08-09 DIAGNOSIS — N184 Chronic kidney disease, stage 4 (severe): Secondary | ICD-10-CM | POA: Diagnosis not present

## 2018-08-10 DIAGNOSIS — N184 Chronic kidney disease, stage 4 (severe): Secondary | ICD-10-CM | POA: Diagnosis not present

## 2018-08-11 DIAGNOSIS — N184 Chronic kidney disease, stage 4 (severe): Secondary | ICD-10-CM | POA: Diagnosis not present

## 2018-08-12 DIAGNOSIS — N184 Chronic kidney disease, stage 4 (severe): Secondary | ICD-10-CM | POA: Diagnosis not present

## 2018-08-13 DIAGNOSIS — N184 Chronic kidney disease, stage 4 (severe): Secondary | ICD-10-CM | POA: Diagnosis not present

## 2018-08-14 DIAGNOSIS — N184 Chronic kidney disease, stage 4 (severe): Secondary | ICD-10-CM | POA: Diagnosis not present

## 2018-08-15 DIAGNOSIS — N184 Chronic kidney disease, stage 4 (severe): Secondary | ICD-10-CM | POA: Diagnosis not present

## 2018-08-16 ENCOUNTER — Encounter: Payer: Medicaid Other | Admitting: Internal Medicine

## 2018-08-16 DIAGNOSIS — N184 Chronic kidney disease, stage 4 (severe): Secondary | ICD-10-CM | POA: Diagnosis not present

## 2018-08-17 ENCOUNTER — Ambulatory Visit: Payer: Medicaid Other | Admitting: Internal Medicine

## 2018-08-17 VITALS — BP 146/101 | HR 92 | Temp 98.4°F | Wt 280.8 lb

## 2018-08-17 DIAGNOSIS — N184 Chronic kidney disease, stage 4 (severe): Secondary | ICD-10-CM | POA: Diagnosis not present

## 2018-08-17 DIAGNOSIS — Z794 Long term (current) use of insulin: Secondary | ICD-10-CM | POA: Diagnosis not present

## 2018-08-17 DIAGNOSIS — I5042 Chronic combined systolic (congestive) and diastolic (congestive) heart failure: Secondary | ICD-10-CM

## 2018-08-17 DIAGNOSIS — E785 Hyperlipidemia, unspecified: Secondary | ICD-10-CM | POA: Diagnosis not present

## 2018-08-17 DIAGNOSIS — R220 Localized swelling, mass and lump, head: Secondary | ICD-10-CM | POA: Diagnosis not present

## 2018-08-17 DIAGNOSIS — E1121 Type 2 diabetes mellitus with diabetic nephropathy: Secondary | ICD-10-CM | POA: Diagnosis not present

## 2018-08-17 LAB — POCT GLYCOSYLATED HEMOGLOBIN (HGB A1C): Hemoglobin A1C: 9.4 % — AB (ref 4.0–5.6)

## 2018-08-17 LAB — GLUCOSE, CAPILLARY: Glucose-Capillary: 287 mg/dL — ABNORMAL HIGH (ref 70–99)

## 2018-08-17 MED ORDER — ATORVASTATIN CALCIUM 80 MG PO TABS: 80.0000 mg | ORAL_TABLET | Freq: Every day | ORAL | 2 refills | Status: DC

## 2018-08-17 MED FILL — ATORVASTATIN CALCIUM 80 MG: 80 | 30 days supply | Qty: 30 | Fill #0

## 2018-08-17 NOTE — Progress Notes (Signed)
   CC: F/U DM  HPI:  [name] is a 37 y.o. male who presented to the clinic for continued evaluation and management of his chronic medical illnesses. For a detailed assessment and plan please refer to problem based charting below.   Past Medical History:  Diagnosis Date  . Abscess of left groin   . Acute blood loss anemia 11/11/2013  . Anemia in chronic kidney disease (CKD)   . Asthma   . Boil of scrotum 11/21/2015  . Chest pain    a. 01/2015 Lexiscan MV: EF 28%, inferior, inferolateral, apical ischemia;  b. 01/2015 Cath: nl cors, PCWP 18 mmHg, CO 9.38 L/min, CI 3.53 L/min/m^2.  . Essential hypertension   . Hyperlipidemia   . Membranous glomerulonephritis   . Morbid obesity ([place])   . Nonischemic cardiomyopathy ([place])    a. 01/2015 Echo: EF 20-25%, diff HK, Gr 2 DD, Triv AI, mildly dil LA and Ao root.  . Type II diabetes mellitus ([place])    a. 01/2015 HbA1c = 8.9.   Review of Systems:  12 point ROS preformed. All negative aside from those mentioned in the HPI.  Physical Exam: Vitals:   08/17/18 1028  BP: (!) 146/101  Pulse: 92  Temp: 98.4 F (36.9 C)  TempSrc: Oral  SpO2: 100%  Weight: 280 lb 12.8 oz (127.4 kg)   General: Well nourished male in no acute distress HENT: Normocephalic, atraumatic, moist mucus membranes, robbery, mobile nodule on the forehead Pulm: Good air movement with no wheezing or crackles  CV: RRR, no murmurs, no rubs  Abdomen: No nodules at injection site.  Assessment & Plan:   See Encounters Tab for problem based charting.  Patient discussed with Dr. Rebeca Alert

## 2018-08-17 NOTE — Assessment & Plan Note (Signed)
Prescription for atorvastatin sent.

## 2018-08-17 NOTE — Patient Instructions (Signed)
Thank you for allowing [place] to provide your care.   Great work with getting your A1c down from 12.8 to 9.4. Please continue all your medications as prescribed with the exception of increasing your NovoLog to 17 units three times daily with meals. It is very important that you check your blood sugars four times daily (in the morning when you first wake up and two hours after each meal). When you come back please bring this log and it will help [place] determine how we need to adjuster insulin. Continue to stay active and exercise daily. Continue to work on your diet and avoiding high sugar foods.  Diabetes Mellitus and Nutrition When you have diabetes (diabetes mellitus), it is very important to have healthy eating habits because your blood sugar (glucose) levels are greatly affected by what you eat and drink. Eating healthy foods in the appropriate amounts, at about the same times every day, can help you:  Control your blood glucose.  Lower your risk of heart disease.  Improve your blood pressure.  Reach or maintain a healthy weight.  Every person with diabetes is different, and each person has different needs for a meal plan. Your health care provider may recommend that you work with a diet and nutrition specialist (dietitian) to make a meal plan that is best for you. Your meal plan may vary depending on factors such as:  The calories you need.  The medicines you take.  Your weight.  Your blood glucose, blood pressure, and cholesterol levels.  Your activity level.  Other health conditions you have, such as heart or kidney disease.  How do carbohydrates affect me? Carbohydrates affect your blood glucose level more than any other type of food. Eating carbohydrates naturally increases the amount of glucose in your blood. Carbohydrate counting is a method for keeping track of how many carbohydrates you eat. Counting carbohydrates is important to keep your blood glucose at a healthy level,  especially if you use insulin or take certain oral diabetes medicines. It is important to know how many carbohydrates you can safely have in each meal. This is different for every person. Your dietitian can help you calculate how many carbohydrates you should have at each meal and for snack. Foods that contain carbohydrates include:  Bread, cereal, rice, pasta, and crackers.  Potatoes and corn.  Peas, beans, and lentils.  Milk and yogurt.  Fruit and juice.  Desserts, such as cakes, cookies, ice cream, and candy.  How does alcohol affect me? Alcohol can cause a sudden decrease in blood glucose (hypoglycemia), especially if you use insulin or take certain oral diabetes medicines. Hypoglycemia can be a life-threatening condition. Symptoms of hypoglycemia (sleepiness, dizziness, and confusion) are similar to symptoms of having too much alcohol. If your health care provider says that alcohol is safe for you, follow these guidelines:  Limit alcohol intake to no more than 1 drink per day for nonpregnant women and 2 drinks per day for men. One drink equals 12 oz of beer, 5 oz of wine, or 1 oz of hard liquor.  Do not drink on an empty stomach.  Keep yourself hydrated with water, diet soda, or unsweetened iced tea.  Keep in mind that regular soda, juice, and other mixers may contain a lot of sugar and must be counted as carbohydrates.  What are tips for following this plan? Reading food labels  Start by checking the serving size on the label. The amount of calories, carbohydrates, fats, and other nutrients listed  on the label are based on one serving of the food. Many foods contain more than one serving per package.  Check the total grams (g) of carbohydrates in one serving. You can calculate the number of servings of carbohydrates in one serving by dividing the total carbohydrates by 15. For example, if a food has 30 g of total carbohydrates, it would be equal to 2 servings of  carbohydrates.  Check the number of grams (g) of saturated and trans fats in one serving. Choose foods that have low or no amount of these fats.  Check the number of milligrams (mg) of sodium in one serving. Most people should limit total sodium intake to less than 2,300 mg per day.  Always check the nutrition information of foods labeled as "low-fat" or "nonfat". These foods may be higher in added sugar or refined carbohydrates and should be avoided.  Talk to your dietitian to identify your daily goals for nutrients listed on the label. Shopping  Avoid buying canned, premade, or processed foods. These foods tend to be high in fat, sodium, and added sugar.  Shop around the outside edge of the grocery store. This includes fresh fruits and vegetables, bulk grains, fresh meats, and fresh dairy. Cooking  Use low-heat cooking methods, such as baking, instead of high-heat cooking methods like deep frying.  Cook using healthy oils, such as olive, canola, or sunflower oil.  Avoid cooking with butter, cream, or high-fat meats. Meal planning  Eat meals and snacks regularly, preferably at the same times every day. Avoid going long periods of time without eating.  Eat foods high in fiber, such as fresh fruits, vegetables, beans, and whole grains. Talk to your dietitian about how many servings of carbohydrates you can eat at each meal.  Eat 4-6 ounces of lean protein each day, such as lean meat, chicken, fish, eggs, or tofu. 1 ounce is equal to 1 ounce of meat, chicken, or fish, 1 egg, or 1/4 cup of tofu.  Eat some foods each day that contain healthy fats, such as avocado, nuts, seeds, and fish. Lifestyle   Check your blood glucose regularly.  Exercise at least 30 minutes 5 or more days each week, or as told by your health care provider.  Take medicines as told by your health care provider.  Do not use any products that contain nicotine or tobacco, such as cigarettes and e-cigarettes. If  you need help quitting, ask your health care provider.  Work with a Social worker or diabetes educator to identify strategies to manage stress and any emotional and social challenges. What are some questions to ask my health care provider?  Do I need to meet with a diabetes educator?  Do I need to meet with a dietitian?  What number can I call if I have questions?  When are the best times to check my blood glucose? Where to find more information:  American Diabetes Association: diabetes.org/food-and-fitness/food  Academy of Nutrition and Dietetics: PokerClues.dk  [employer] of Diabetes and Digestive and Kidney Diseases (NIH): ContactWire.be Summary  A healthy meal plan will help you control your blood glucose and maintain a healthy lifestyle.  Working with a diet and nutrition specialist (dietitian) can help you make a meal plan that is best for you.  Keep in mind that carbohydrates and alcohol have immediate effects on your blood glucose levels. It is important to count carbohydrates and to use alcohol carefully. This information is not intended to replace advice given to you by your  health care provider. Make sure you discuss any questions you have with your health care provider. Document Released: 05/22/2005 Document Revised: 09/29/2016 Document Reviewed: 09/29/2016 Elsevier Interactive Patient Education  2018 [employer].  Lipoma A lipoma is a noncancerous (benign) tumor that is made up of fat cells. This is a very common type of soft-tissue growth. Lipomas are usually found under the skin (subcutaneous). They may occur in any tissue of the body that contains fat. Common areas for lipomas to appear include the back, shoulders, buttocks, and thighs. Lipomas grow slowly, and they are usually painless. Most lipomas do not cause problems and do not require  treatment. What are the causes? The cause of this condition is not known. What increases the risk? This condition is more likely to develop in:  People who are 30-37 years old.  People who have a family history of lipomas.  What are the signs or symptoms? A lipoma usually appears as a small, round bump under the skin. It may feel soft or rubbery, but the firmness can vary. Most lipomas are not painful. However, a lipoma may become painful if it is located in an area where it pushes on nerves. How is this diagnosed? A lipoma can usually be diagnosed with a physical exam. You may also have tests to confirm the diagnosis and to rule out other conditions. Tests may include:  Imaging tests, such as a CT scan or MRI.  Removal of a tissue sample to be looked at under a microscope (biopsy).  How is this treated? Treatment is not needed for small lipomas that are not causing problems. If a lipoma continues to get bigger or it causes problems, removal is often the best option. Lipomas can also be removed to improve appearance. Removal of a lipoma is usually done with a surgery in which the fatty cells and the surrounding capsule are removed. Most often, a medicine that numbs the area (local anesthetic) is used for this procedure. Follow these instructions at home:  Keep all follow-up visits as directed by your health care provider. This is important. Contact a health care provider if:  Your lipoma becomes larger or hard.  Your lipoma becomes painful, red, or increasingly swollen. These could be signs of infection or a more serious condition. This information is not intended to replace advice given to you by your health care provider. Make sure you discuss any questions you have with your health care provider. Document Released: 08/15/2002 Document Revised: 01/31/2016 Document Reviewed: 08/21/2014 Elsevier Interactive Patient Education  [employer].

## 2018-08-17 NOTE — Assessment & Plan Note (Signed)
Patient presented for continued management of his uncontrolled diabetes. He is on Lantus 36 units QHS , Novolog 15 units TID WC, Victoza 1.8 mg QD, and Glipizide 5 mg QD. He unfortunately forgot to bring his meter today so we have no data to compare. He states that he is not had any further episodes of hypoglycemia since decreasing his Lantus as last visit with his primary care provider. He does miss, on average, two days of his medication per week and states this is mainly his Lantus. When he does check his blood sugars he states that there typically over 200. He does note a decreased in his blood sugar from his evening to fasting. He is trying to be more active since he recently got a dog. He is continuing to work on his diet and avoid high sugary foods. He would like a referral to podiatry today.  We discussed the importance of checking his blood sugar four times a day and bringing those longest the clinic so we know how to best adjust his medications. We will increase his mealtime insulin to 17 units three times a day with meals. He still may be over treated with his long acting insulin given that his evening CBG is higher than his fasting CBG.  He may be a good candidate for a CGM. This would allow [place] to collect at least two weeks of uninterrupted data and make adjustments after each week.

## 2018-08-18 DIAGNOSIS — N184 Chronic kidney disease, stage 4 (severe): Secondary | ICD-10-CM | POA: Diagnosis not present

## 2018-08-19 DIAGNOSIS — N184 Chronic kidney disease, stage 4 (severe): Secondary | ICD-10-CM | POA: Diagnosis not present

## 2018-08-20 ENCOUNTER — Other Ambulatory Visit (HOSPITAL_COMMUNITY): Payer: Self-pay | Admitting: Cardiology

## 2018-08-20 DIAGNOSIS — N184 Chronic kidney disease, stage 4 (severe): Secondary | ICD-10-CM | POA: Diagnosis not present

## 2018-08-20 MED FILL — TACROLIMUS 1 MG CAPSULE: 1 | 30 days supply | Qty: 240 | Fill #0

## 2018-08-20 MED FILL — CARVEDILOL 25 MG TABLET: 25 | 30 days supply | Qty: 60 | Fill #0

## 2018-08-20 MED FILL — POTASSIUM CHLORIDE CRYS ER: 20 | 90 days supply | Qty: 90 | Fill #1

## 2018-08-21 DIAGNOSIS — N184 Chronic kidney disease, stage 4 (severe): Secondary | ICD-10-CM | POA: Diagnosis not present

## 2018-08-22 DIAGNOSIS — N184 Chronic kidney disease, stage 4 (severe): Secondary | ICD-10-CM | POA: Diagnosis not present

## 2018-08-23 DIAGNOSIS — N184 Chronic kidney disease, stage 4 (severe): Secondary | ICD-10-CM | POA: Diagnosis not present

## 2018-08-23 NOTE — Progress Notes (Signed)
Internal Medicine Clinic Attending  Case discussed with Dr. Helberg at the time of the visit.  We reviewed the resident's history and exam and pertinent patient test results.  I agree with the assessment, diagnosis, and plan of care documented in the resident's note.  Alexander Raines, M.D., Ph.D.  

## 2018-08-24 DIAGNOSIS — N184 Chronic kidney disease, stage 4 (severe): Secondary | ICD-10-CM | POA: Diagnosis not present

## 2018-08-25 DIAGNOSIS — N184 Chronic kidney disease, stage 4 (severe): Secondary | ICD-10-CM | POA: Diagnosis not present

## 2018-08-26 DIAGNOSIS — N184 Chronic kidney disease, stage 4 (severe): Secondary | ICD-10-CM | POA: Diagnosis not present

## 2018-08-27 DIAGNOSIS — N184 Chronic kidney disease, stage 4 (severe): Secondary | ICD-10-CM | POA: Diagnosis not present

## 2018-08-28 DIAGNOSIS — N184 Chronic kidney disease, stage 4 (severe): Secondary | ICD-10-CM | POA: Diagnosis not present

## 2018-08-29 DIAGNOSIS — N184 Chronic kidney disease, stage 4 (severe): Secondary | ICD-10-CM | POA: Diagnosis not present

## 2018-08-30 DIAGNOSIS — N184 Chronic kidney disease, stage 4 (severe): Secondary | ICD-10-CM | POA: Diagnosis not present

## 2018-08-31 DIAGNOSIS — N184 Chronic kidney disease, stage 4 (severe): Secondary | ICD-10-CM | POA: Diagnosis not present

## 2018-09-01 DIAGNOSIS — N184 Chronic kidney disease, stage 4 (severe): Secondary | ICD-10-CM | POA: Diagnosis not present

## 2018-09-02 DIAGNOSIS — N184 Chronic kidney disease, stage 4 (severe): Secondary | ICD-10-CM | POA: Diagnosis not present

## 2018-09-03 DIAGNOSIS — N184 Chronic kidney disease, stage 4 (severe): Secondary | ICD-10-CM | POA: Diagnosis not present

## 2018-09-04 DIAGNOSIS — N184 Chronic kidney disease, stage 4 (severe): Secondary | ICD-10-CM | POA: Diagnosis not present

## 2018-09-05 DIAGNOSIS — N184 Chronic kidney disease, stage 4 (severe): Secondary | ICD-10-CM | POA: Diagnosis not present

## 2018-09-06 DIAGNOSIS — N184 Chronic kidney disease, stage 4 (severe): Secondary | ICD-10-CM | POA: Diagnosis not present

## 2018-09-06 MED FILL — FUROSEMIDE 80 MG TABLET: 80 | 30 days supply | Qty: 60 | Fill #6

## 2018-09-06 MED FILL — cloNIDine HCL 0.3 MG TABS: 0.3 | 30 days supply | Qty: 60 | Fill #2

## 2018-09-07 DIAGNOSIS — N184 Chronic kidney disease, stage 4 (severe): Secondary | ICD-10-CM | POA: Diagnosis not present

## 2018-09-08 DIAGNOSIS — N184 Chronic kidney disease, stage 4 (severe): Secondary | ICD-10-CM | POA: Diagnosis not present

## 2018-09-09 DIAGNOSIS — N184 Chronic kidney disease, stage 4 (severe): Secondary | ICD-10-CM | POA: Diagnosis not present

## 2018-09-10 DIAGNOSIS — N184 Chronic kidney disease, stage 4 (severe): Secondary | ICD-10-CM | POA: Diagnosis not present

## 2018-09-11 DIAGNOSIS — N184 Chronic kidney disease, stage 4 (severe): Secondary | ICD-10-CM | POA: Diagnosis not present

## 2018-09-12 DIAGNOSIS — N184 Chronic kidney disease, stage 4 (severe): Secondary | ICD-10-CM | POA: Diagnosis not present

## 2018-09-13 DIAGNOSIS — N184 Chronic kidney disease, stage 4 (severe): Secondary | ICD-10-CM | POA: Diagnosis not present

## 2018-09-14 DIAGNOSIS — N184 Chronic kidney disease, stage 4 (severe): Secondary | ICD-10-CM | POA: Diagnosis not present

## 2018-09-14 MED FILL — TACROLIMUS 1 MG CAPSULE: 1 | 30 days supply | Qty: 240 | Fill #1

## 2018-09-14 MED FILL — CARVEDILOL 25 MG TABLET: 25 | 30 days supply | Qty: 60 | Fill #1

## 2018-09-14 MED FILL — ATORVASTATIN CALCIUM 80 MG: 80 | 30 days supply | Qty: 30 | Fill #1

## 2018-09-15 DIAGNOSIS — N184 Chronic kidney disease, stage 4 (severe): Secondary | ICD-10-CM | POA: Diagnosis not present

## 2018-09-16 ENCOUNTER — Other Ambulatory Visit: Payer: Self-pay | Admitting: Internal Medicine

## 2018-09-16 DIAGNOSIS — E1121 Type 2 diabetes mellitus with diabetic nephropathy: Secondary | ICD-10-CM

## 2018-09-16 DIAGNOSIS — N184 Chronic kidney disease, stage 4 (severe): Secondary | ICD-10-CM | POA: Diagnosis not present

## 2018-09-16 MED FILL — LANTUS 100 UNITS/ML VIAL: 100 | 27 days supply | Qty: 10 | Fill #2

## 2018-09-16 MED FILL — NovoLOG 100 UNIT/ML SOLN: 100 | 22 days supply | Qty: 10 | Fill #1

## 2018-09-16 MED FILL — hydrALAZINE HCL 100 MG TABS: 100 | 30 days supply | Qty: 90 | Fill #3

## 2018-09-17 DIAGNOSIS — N184 Chronic kidney disease, stage 4 (severe): Secondary | ICD-10-CM | POA: Diagnosis not present

## 2018-09-17 MED FILL — ULTICARE INS 0.5 ML 30GX1/2: 30G X 1/2" | 30 days supply | Qty: 120 | Fill #0

## 2018-09-17 MED FILL — VICTOZA 18 MG/3 ML INJECT P: 18 | 30 days supply | Qty: 9 | Fill #3

## 2018-09-18 DIAGNOSIS — N184 Chronic kidney disease, stage 4 (severe): Secondary | ICD-10-CM | POA: Diagnosis not present

## 2018-09-19 DIAGNOSIS — N184 Chronic kidney disease, stage 4 (severe): Secondary | ICD-10-CM | POA: Diagnosis not present

## 2018-09-20 DIAGNOSIS — N184 Chronic kidney disease, stage 4 (severe): Secondary | ICD-10-CM | POA: Diagnosis not present

## 2018-09-21 DIAGNOSIS — N184 Chronic kidney disease, stage 4 (severe): Secondary | ICD-10-CM | POA: Diagnosis not present

## 2018-09-22 DIAGNOSIS — N184 Chronic kidney disease, stage 4 (severe): Secondary | ICD-10-CM | POA: Diagnosis not present

## 2018-09-23 ENCOUNTER — Ambulatory Visit: Payer: Medicaid Other | Admitting: Internal Medicine

## 2018-09-23 ENCOUNTER — Other Ambulatory Visit: Payer: Self-pay

## 2018-09-23 DIAGNOSIS — R220 Localized swelling, mass and lump, head: Secondary | ICD-10-CM

## 2018-09-23 DIAGNOSIS — M7989 Other specified soft tissue disorders: Secondary | ICD-10-CM

## 2018-09-23 DIAGNOSIS — N184 Chronic kidney disease, stage 4 (severe): Secondary | ICD-10-CM | POA: Diagnosis not present

## 2018-09-23 DIAGNOSIS — M7918 Myalgia, other site: Secondary | ICD-10-CM

## 2018-09-23 NOTE — Progress Notes (Signed)
   CC: Inner right gluteal tenderness  HPI:Mr.Srihari Shellhammer is a 38 y.o. male who presents for evaluation of gluteal boil. Please see individual problem based A/P for details.  PHQ-9: Based on the patients    Office Visit from 09/23/2018 in [place]  PHQ-9 Total Score  9     score we have decided to monitor.  Past Medical History:  Diagnosis Date  . Abscess of left groin   . Acute blood loss anemia 11/11/2013  . Anemia in chronic kidney disease (CKD)   . Asthma   . Boil of scrotum 11/21/2015  . Chest pain    a. 01/2015 Lexiscan MV: EF 28%, inferior, inferolateral, apical ischemia;  b. 01/2015 Cath: nl cors, PCWP 18 mmHg, CO 9.38 L/min, CI 3.53 L/min/m^2.  . Essential hypertension   . Hyperlipidemia   . Membranous glomerulonephritis   . Morbid obesity ([place])   . Nonischemic cardiomyopathy ([place])    a. 01/2015 Echo: EF 20-25%, diff HK, Gr 2 DD, Triv AI, mildly dil LA and Ao root.  . Type II diabetes mellitus ([place])    a. 01/2015 HbA1c = 8.9.   Review of Systems: ROS negative except as per HPI  Physical Exam: Vitals:   09/23/18 1031  BP: 125/77  Pulse: 71  Temp: 98.1 F (36.7 C)  TempSrc: Oral  SpO2: 100%  Weight: 275 lb 6.4 oz (124.9 kg)  Height: 6\' 5"  (1.956 m)   General: A/O x4, afebrile, nondiaphoretic, no acute distress HEENT:  MSK: Tenderness overlying the right initial tuberosity.  Assessment & Plan:   See Encounters Tab for problem based charting.  Patient discussed with Dr. Dareen Piano

## 2018-09-23 NOTE — Assessment & Plan Note (Signed)
Forehead mass: Small ~2 x 2 cm hypermobile flat mass on the left forehead consisting of benign features. No overlying skin changes consistent with a primary cancers skin lesion. See image. Most likely a lipoma   Plan:  Monitor with serial images Patient advised to notify MD if he observes rapid enlargement or mass becomes firm.

## 2018-09-23 NOTE — Assessment & Plan Note (Signed)
  Right ischial tuberosity inflammation: The patient presents today for a 12-hour history of right inner gluteal cleft mass or nodule.  He stated that after sitting outdoors on his concrete porch for approximately 2 hours he noticed pain in the right intergluteal cleft.  He felt at the time that there is a palpable nodule or mass.  He denies fever, chills, nausea, vomiting, diarrhea, constipation, hematochezia, melena, abdominal pain, pelvic pain, leg pain, unprotected sexual contact, or other concerns today. During the exam today, the patient was unable to locate the nodule but points to the right initial tuberosity.  There is no overlying mass, nodule, skin abnormality, or epidural/subdural abnormality.  This appears to be right initial tuberosity pain from prolonged sitting on hard surface.  Given the rapid improvement in symptoms I do not feel there is a stress fracture or other more pathologic process involved  Plan: Advised the patient to sit on soft surfaces until it improves He may need to increase gluteal muscle density if this reoccurs with gluteal strengthening exercises such as hip thrusts and squats Given strict return precautions

## 2018-09-23 NOTE — Patient Instructions (Signed)
FOLLOW-UP INSTRUCTIONS When: As needed For:  What to bring: All of your medications   I have not made any changes to your medications today.   Today we discussed the soreness of the left inner gluteal muscle. I feel that this was most likely local inflammation of a muscle or ligament due to pressure as you describe from sitting in that position for so long. I would advise you to observe for lesions in the skin, fever, chills, blood in your stool, superficial hard nodules.   Thank you for your visit to the [facility] today. If you have any questions or concerns please call us at [phone].

## 2018-09-24 DIAGNOSIS — N184 Chronic kidney disease, stage 4 (severe): Secondary | ICD-10-CM | POA: Diagnosis not present

## 2018-09-24 NOTE — Progress Notes (Signed)
Internal Medicine Clinic Attending  Case discussed with Dr. Harbrecht at the time of the visit.  We reviewed the resident's history and exam and pertinent patient test results.  I agree with the assessment, diagnosis, and plan of care documented in the resident's note.   

## 2018-09-25 DIAGNOSIS — N184 Chronic kidney disease, stage 4 (severe): Secondary | ICD-10-CM | POA: Diagnosis not present

## 2018-09-26 DIAGNOSIS — N184 Chronic kidney disease, stage 4 (severe): Secondary | ICD-10-CM | POA: Diagnosis not present

## 2018-09-27 DIAGNOSIS — N184 Chronic kidney disease, stage 4 (severe): Secondary | ICD-10-CM | POA: Diagnosis not present

## 2018-09-28 DIAGNOSIS — N184 Chronic kidney disease, stage 4 (severe): Secondary | ICD-10-CM | POA: Diagnosis not present

## 2018-09-29 DIAGNOSIS — N184 Chronic kidney disease, stage 4 (severe): Secondary | ICD-10-CM | POA: Diagnosis not present

## 2018-09-30 DIAGNOSIS — N184 Chronic kidney disease, stage 4 (severe): Secondary | ICD-10-CM | POA: Diagnosis not present

## 2018-10-01 DIAGNOSIS — N184 Chronic kidney disease, stage 4 (severe): Secondary | ICD-10-CM | POA: Diagnosis not present

## 2018-10-02 DIAGNOSIS — N184 Chronic kidney disease, stage 4 (severe): Secondary | ICD-10-CM | POA: Diagnosis not present

## 2018-10-03 DIAGNOSIS — N184 Chronic kidney disease, stage 4 (severe): Secondary | ICD-10-CM | POA: Diagnosis not present

## 2018-10-04 DIAGNOSIS — N184 Chronic kidney disease, stage 4 (severe): Secondary | ICD-10-CM | POA: Diagnosis not present

## 2018-10-05 DIAGNOSIS — N184 Chronic kidney disease, stage 4 (severe): Secondary | ICD-10-CM | POA: Diagnosis not present

## 2018-10-06 ENCOUNTER — Other Ambulatory Visit (HOSPITAL_COMMUNITY): Payer: Self-pay | Admitting: Cardiology

## 2018-10-06 ENCOUNTER — Encounter: Payer: Self-pay | Admitting: *Deleted

## 2018-10-06 DIAGNOSIS — N184 Chronic kidney disease, stage 4 (severe): Secondary | ICD-10-CM | POA: Diagnosis not present

## 2018-10-06 MED FILL — AMLODIPINE BESYLATE 10 MG T: 10 | 30 days supply | Qty: 30 | Fill #2

## 2018-10-06 MED FILL — cloNIDine HCL 0.3 MG TABS: 0.3 | 30 days supply | Qty: 60 | Fill #0

## 2018-10-07 DIAGNOSIS — N184 Chronic kidney disease, stage 4 (severe): Secondary | ICD-10-CM | POA: Diagnosis not present

## 2018-10-07 MED FILL — UNIFINE PENTIPS 32GX5/32": 32G X 4 MM | 30 days supply | Qty: 30 | Fill #3

## 2018-10-07 MED FILL — UNIFINE PENTIPS 32GX5/32: 32G X 4 MM | 30 days supply | Qty: 30 | Fill #3

## 2018-10-08 ENCOUNTER — Other Ambulatory Visit (HOSPITAL_COMMUNITY): Payer: Self-pay | Admitting: Cardiology

## 2018-10-08 DIAGNOSIS — N184 Chronic kidney disease, stage 4 (severe): Secondary | ICD-10-CM | POA: Diagnosis not present

## 2018-10-08 MED FILL — FUROSEMIDE 80 MG TABLET: 80 | 30 days supply | Qty: 60 | Fill #0

## 2018-10-09 DIAGNOSIS — N184 Chronic kidney disease, stage 4 (severe): Secondary | ICD-10-CM | POA: Diagnosis not present

## 2018-10-10 DIAGNOSIS — N184 Chronic kidney disease, stage 4 (severe): Secondary | ICD-10-CM | POA: Diagnosis not present

## 2018-10-11 DIAGNOSIS — N184 Chronic kidney disease, stage 4 (severe): Secondary | ICD-10-CM | POA: Diagnosis not present

## 2018-10-12 DIAGNOSIS — N184 Chronic kidney disease, stage 4 (severe): Secondary | ICD-10-CM | POA: Diagnosis not present

## 2018-10-13 DIAGNOSIS — N184 Chronic kidney disease, stage 4 (severe): Secondary | ICD-10-CM | POA: Diagnosis not present

## 2018-10-13 DIAGNOSIS — Z6835 Body mass index (BMI) 35.0-35.9, adult: Secondary | ICD-10-CM | POA: Diagnosis not present

## 2018-10-13 DIAGNOSIS — N2581 Secondary hyperparathyroidism of renal origin: Secondary | ICD-10-CM | POA: Diagnosis not present

## 2018-10-13 DIAGNOSIS — E785 Hyperlipidemia, unspecified: Secondary | ICD-10-CM | POA: Diagnosis not present

## 2018-10-13 DIAGNOSIS — I129 Hypertensive chronic kidney disease with stage 1 through stage 4 chronic kidney disease, or unspecified chronic kidney disease: Secondary | ICD-10-CM | POA: Diagnosis not present

## 2018-10-13 DIAGNOSIS — E1129 Type 2 diabetes mellitus with other diabetic kidney complication: Secondary | ICD-10-CM | POA: Diagnosis not present

## 2018-10-13 DIAGNOSIS — N052 Unspecified nephritic syndrome with diffuse membranous glomerulonephritis: Secondary | ICD-10-CM | POA: Diagnosis not present

## 2018-10-13 DIAGNOSIS — I5022 Chronic systolic (congestive) heart failure: Secondary | ICD-10-CM | POA: Diagnosis not present

## 2018-10-14 DIAGNOSIS — N184 Chronic kidney disease, stage 4 (severe): Secondary | ICD-10-CM | POA: Diagnosis not present

## 2018-10-14 MED FILL — glipiZIDE 5 MG TABS: 5 | 90 days supply | Qty: 90 | Fill #3

## 2018-10-14 MED FILL — ATORVASTATIN CALCIUM 80 MG: 80 | 30 days supply | Qty: 30 | Fill #2

## 2018-10-14 NOTE — Progress Notes (Signed)
Patient ID: Jeremy [name], male   DOB: [birth year], 38 y.o.   MRN: [id number]    Advanced Heart Failure Clinic Note   PCP: Dr. Randell Patient Primary cardiologist: Dr. Aundra Dubin Nephrology: Dr Lorrene Reid.   38 y.o. with history of nonischemic cardiomyopathy, HTN, and DM presents for cardiology followup.  In 4/16, patient was seen in the ER with edema and chest pain.  The only medication he was taking was metformin.  BP was elevated.  Echo showed EF 20-25%, and Cardiolite suggested ischemia.  He had RHC/LHC in 5/16.  This showed no coronary disease and mildly elevated filling pressures.    Admitted 9/21 through 06/03/16 with marked volume overload and hypertensive crisis. Diuresed with IV lasix and transitioned to lasix 160 mg twice daily. Due to worsening renal function, nephrology consulted. Work up consistent with nephrotic syndrome. 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  Discharge creatinine 3.42.  He has been following with renal. He has been getting iron infusions and feels like he has more energy.  EF improved to 55% on 9/17 echo.    Echo 01/2018 EF 50-55%, moderate LVH, normal RV.    Today he returns for HF follow up. Overall feeling fine. Denies SOB/PND/Orthopnea. No chest pain.  Appetite ok. No fever or chills. Weight at home has been stable. Taking all medications.   Labs (6/16): LDL 308, HDL 46, TGs 284 Labs (7/16): K 3.5, creatinine 1.0 Labs 05/24/2015: K 3.9 Creatinine 1.43  Labs (3/17): creatinine 1.55, LDL 108, HDL 31 Labs (9/17): hemoglobin 8.3, K 4, BUN 40, creatinine 3.69, BNP 56 Labs (06/03/2016) : K 4.6 Creatinine 3.42  Labs (10/17): K 4.4, creatinine 3.44, hgb 9.4 Labs (1/18): K 4.9, creatinine 1.85 Labs (4/19): K 4.6, creatinine 2.14  PMH: 1. Type II diabetes since 2006.  2. HTN since around 2010.   3. Asthma 4. Hyperlipidemia 5. Morbid obesity 6. Cardiomyopathy: Nonischemic.  Echo (5/16) with EF 20-25%, diffuse hypokinesis, grade  II diastolic dysfunction.  Lexiscan Cardiolite (5/16) with EF 28%, inferior/inferolateral/apical ischemia.  RHC/LHC (5/16) with normal coronaries, mean RA 11, PA 33/21 mean 28, mean PCWP 18, CI 2.53.  - Echo (4/17): EF 50%, moderate LVH, normal RV size and systolic function. Grade IDD  - Echo (9/17): EF 55%.  - Echo (5/19): EF 50-55%, moderate LVH, normal RV size and systolic function.  7. ABIs normal 8/16.  8. CKD: AKI in 9/17 with diagnosis of nephrotic syndrome. 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   SH: Lives with girlfriend, nonsmoker, no cocaine, occasional marijuana, no ETOH.  Works as Art gallery manager.  FH: Father with CHF, died at 63.  Sister with CHF, PAD.   Review of systems complete and found to be negative unless listed in HPI.    Current Outpatient Medications  Medication Sig Dispense Refill  . ACCU-CHEK FASTCLIX LANCETS MISC Check your blood sugars 4 times a day. 102 each 12  . amLODipine (NORVASC) 10 MG tablet TAKE 1 TABLET BY MOUTH ONCE DAILY AT BEDTIME 30 tablet 6  . ASPIR-LOW 81 MG EC tablet TAKE 1 TABLET BY MOUTH DAILY. 30 tablet 11  . atorvastatin (LIPITOR) 80 MG tablet Take 1 tablet (80 mg total) by mouth daily. 90 tablet 2  . blood glucose meter kit and supplies KIT Dispense based on patient and insurance preference. Use up to four times daily as directed. (FOR ICD-9 250.00, 250.01). 1 each 0  . carvedilol (COREG) 25 MG tablet TAKE 1 TABLET (25 MG TOTAL) BY  MOUTH 2 TIMES DAILY WITH A MEAL. 60 tablet 5  . cloNIDine (CATAPRES) 0.3 MG tablet TAKE 1 TABLET BY MOUTH TWICE A DAY 60 tablet 2  . famotidine (PEPCID) 10 MG tablet Take 1 tablet (10 mg total) by mouth daily as needed for heartburn or indigestion. 90 tablet 3  . furosemide (LASIX) 80 MG tablet TAKE 1 TABLET BY MOUTH TWICE A DAY 60 tablet 6  . glipiZIDE (GLUCOTROL) 5 MG tablet Take 1 tablet (5 mg total) by mouth daily. 90 tablet 3  . glucose blood (ACCU-CHEK GUIDE) test strip Use to  check blood sugar 4 times daily. diag code E11.21. Insulin dependent 375 each 10  . hydrALAZINE (APRESOLINE) 100 MG tablet TAKE 1 TABLET BY MOUTH 3 TIMES DAILY 90 tablet 5  . insulin glargine (LANTUS) 100 UNIT/ML injection INJECT 36 UNITS INTO THE SKIN AT BEDTIME. 30 mL 2  . Insulin Pen Needle (UNIFINE PENTIPS) 32G X 4 MM MISC 1 applicator by Does not apply route QID. 100 each 12  . Insulin Syringe-Needle U-100 (ULTICARE INSULIN SYRINGE) 30G X 1/2" 0.5 ML MISC USE 4 TIMES DAILY WITH INSULIN INJECTIONS. DIAG CODE E11.21. INSULIN DEPENDENT 360 each 1  . liraglutide (VICTOZA) 18 MG/3ML SOPN Inject 0.3 mLs (1.8 mg total) into the skin daily. 36 mL 2  . NOVOLOG 100 UNIT/ML injection INJECT 15 UNITS 3 TIMES A DAY WITH MEALS INTO THE SKIN. 40 mL 3  . potassium chloride SA (K-DUR,KLOR-CON) 20 MEQ tablet TAKE 1 TABLET (20 MEQ TOTAL) BY MOUTH DAILY. 90 tablet 1  . tacrolimus (PROGRAF) 1 MG capsule Take 4 mg by mouth 2 (two) times daily.   5  . nitroGLYCERIN (NITROSTAT) 0.4 MG SL tablet PLACE 1 TABLET UNDER THE TONGUE EVERY 5 MINUTES AS NEEDED FOR CHEST PAIN. (Patient not taking: Reported on 10/15/2018) 25 tablet 3   No current facility-administered medications for this encounter.    Vitals:   10/15/18 1002  BP: 124/76  Pulse: 67  SpO2: 98%  Weight: 124.2 kg (273 lb 12.8 oz)   Wt Readings from Last 3 Encounters:  10/15/18 124.2 kg (273 lb 12.8 oz)  09/23/18 124.9 kg (275 lb 6.4 oz)  08/17/18 127.4 kg (280 lb 12.8 oz)   Physical Exam General:  Well appearing. No resp difficulty HEENT: normal Neck: supple. no JVD. Carotids 2+ bilat; no bruits. No lymphadenopathy or thryomegaly appreciated. Cor: PMI nondisplaced. Regular rate & rhythm. No rubs, gallops or murmurs. Lungs: clear Abdomen: soft, nontender, nondistended. No hepatosplenomegaly. No bruits or masses. Good bowel sounds. Extremities: no cyanosis, clubbing, rash, edema Neuro: alert & orientedx3, cranial nerves grossly intact. moves all 4  extremities w/o difficulty. Affect pleasant  Assessment/Plan:  1. Chronic diastolic CHF: Nonischemic cardiomyopathy, ?from long-standing HTN.  EF was 20-25% in 2006 but EF has improved.  Echo 01/2018  EF 50-55% with moderate LVH and normal RV systolic function.  -NYHA I. Volume status stable. Continue lasix 80 mg daily and he can take an extra 80 mg lasix if needed.  - Continue Coreg 25 mg bid.  2.  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  LDL much better in 3/17, suspect related to compliance with atorvastatin.  - Continue atoravastatin. Will need to check lipids next appt.  3. HTN: Imperative to control for both CHF and renal dysfunction.  Stable.   - Continue current regimen.  4. 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 Followed closely by Dr Lorrene Reid.   - Getting tacrolimus.    Follow up in  6 months with Dr Aundra Dubin.   Darrick Grinder, NP  10/15/2018

## 2018-10-15 ENCOUNTER — Encounter (HOSPITAL_COMMUNITY): Payer: Self-pay

## 2018-10-15 ENCOUNTER — Ambulatory Visit (HOSPITAL_COMMUNITY)
Admission: RE | Admit: 2018-10-15 | Discharge: 2018-10-15 | Disposition: A | Discharge status: Home / Self Care | Payer: Medicaid Other | Source: Ambulatory Visit | Attending: Internal Medicine | Admitting: Internal Medicine

## 2018-10-15 VITALS — BP 124/76 | HR 67 | Wt 273.8 lb

## 2018-10-15 DIAGNOSIS — Z7982 Long term (current) use of aspirin: Secondary | ICD-10-CM | POA: Insufficient documentation

## 2018-10-15 DIAGNOSIS — Z794 Long term (current) use of insulin: Secondary | ICD-10-CM | POA: Insufficient documentation

## 2018-10-15 DIAGNOSIS — I5042 Chronic combined systolic (congestive) and diastolic (congestive) heart failure: Secondary | ICD-10-CM | POA: Diagnosis not present

## 2018-10-15 DIAGNOSIS — J45909 Unspecified asthma, uncomplicated: Secondary | ICD-10-CM | POA: Insufficient documentation

## 2018-10-15 DIAGNOSIS — I5032 Chronic diastolic (congestive) heart failure: Secondary | ICD-10-CM | POA: Insufficient documentation

## 2018-10-15 DIAGNOSIS — N184 Chronic kidney disease, stage 4 (severe): Secondary | ICD-10-CM | POA: Diagnosis not present

## 2018-10-15 DIAGNOSIS — Z8249 Family history of ischemic heart disease and other diseases of the circulatory system: Secondary | ICD-10-CM | POA: Diagnosis not present

## 2018-10-15 DIAGNOSIS — E1122 Type 2 diabetes mellitus with diabetic chronic kidney disease: Secondary | ICD-10-CM | POA: Diagnosis not present

## 2018-10-15 DIAGNOSIS — Z79899 Other long term (current) drug therapy: Secondary | ICD-10-CM | POA: Diagnosis not present

## 2018-10-15 DIAGNOSIS — I428 Other cardiomyopathies: Secondary | ICD-10-CM | POA: Insufficient documentation

## 2018-10-15 DIAGNOSIS — N049 Nephrotic syndrome with unspecified morphologic changes: Secondary | ICD-10-CM | POA: Insufficient documentation

## 2018-10-15 DIAGNOSIS — N189 Chronic kidney disease, unspecified: Secondary | ICD-10-CM | POA: Diagnosis not present

## 2018-10-15 DIAGNOSIS — I130 Hypertensive heart and chronic kidney disease with heart failure and stage 1 through stage 4 chronic kidney disease, or unspecified chronic kidney disease: Secondary | ICD-10-CM | POA: Diagnosis not present

## 2018-10-15 DIAGNOSIS — E785 Hyperlipidemia, unspecified: Secondary | ICD-10-CM | POA: Insufficient documentation

## 2018-10-15 DIAGNOSIS — I10 Essential (primary) hypertension: Secondary | ICD-10-CM | POA: Diagnosis not present

## 2018-10-15 NOTE — Patient Instructions (Signed)
CONTINUE Lasix 80- mg daily at bedtime  Your physician recommends that you schedule a follow-up appointment in: 6 months with Dr Aundra Dubin and echo   Your physician has requested that you have an echocardiogram. Echocardiography is a painless test that uses sound waves to create images of your heart. It provides your doctor with information about the size and shape of your heart and how well your heart's chambers and valves are working. This procedure takes approximately one hour. There are no restrictions for this procedure.  Do the following things EVERYDAY: 1) Weigh yourself in the morning before breakfast. Write it down and keep it in a log. 2) Take your medicines as prescribed 3) Eat low salt foods-Limit salt (sodium) to 2000 mg per day.  4) Stay as active as you can everyday 5) Limit all fluids for the day to less than 2 liters

## 2018-10-16 DIAGNOSIS — N184 Chronic kidney disease, stage 4 (severe): Secondary | ICD-10-CM | POA: Diagnosis not present

## 2018-10-17 DIAGNOSIS — N184 Chronic kidney disease, stage 4 (severe): Secondary | ICD-10-CM | POA: Diagnosis not present

## 2018-10-18 DIAGNOSIS — N184 Chronic kidney disease, stage 4 (severe): Secondary | ICD-10-CM | POA: Diagnosis not present

## 2018-10-19 DIAGNOSIS — N184 Chronic kidney disease, stage 4 (severe): Secondary | ICD-10-CM | POA: Diagnosis not present

## 2018-10-19 MED FILL — CARVEDILOL 25 MG TABLET: 25 | 30 days supply | Qty: 60 | Fill #2

## 2018-10-20 ENCOUNTER — Other Ambulatory Visit (HOSPITAL_COMMUNITY): Payer: Self-pay | Admitting: Cardiology

## 2018-10-20 DIAGNOSIS — N184 Chronic kidney disease, stage 4 (severe): Secondary | ICD-10-CM | POA: Diagnosis not present

## 2018-10-21 DIAGNOSIS — N184 Chronic kidney disease, stage 4 (severe): Secondary | ICD-10-CM | POA: Diagnosis not present

## 2018-10-22 DIAGNOSIS — N184 Chronic kidney disease, stage 4 (severe): Secondary | ICD-10-CM | POA: Diagnosis not present

## 2018-10-22 MED FILL — TACROLIMUS 1 MG CAPSULE: 1 | 30 days supply | Qty: 240 | Fill #2 | Status: TO

## 2018-10-23 DIAGNOSIS — N184 Chronic kidney disease, stage 4 (severe): Secondary | ICD-10-CM | POA: Diagnosis not present

## 2018-10-24 DIAGNOSIS — N184 Chronic kidney disease, stage 4 (severe): Secondary | ICD-10-CM | POA: Diagnosis not present

## 2018-10-25 DIAGNOSIS — N184 Chronic kidney disease, stage 4 (severe): Secondary | ICD-10-CM | POA: Diagnosis not present

## 2018-10-26 DIAGNOSIS — N184 Chronic kidney disease, stage 4 (severe): Secondary | ICD-10-CM | POA: Diagnosis not present

## 2018-10-27 DIAGNOSIS — N184 Chronic kidney disease, stage 4 (severe): Secondary | ICD-10-CM | POA: Diagnosis not present

## 2018-10-28 DIAGNOSIS — N184 Chronic kidney disease, stage 4 (severe): Secondary | ICD-10-CM | POA: Diagnosis not present

## 2018-10-29 DIAGNOSIS — N184 Chronic kidney disease, stage 4 (severe): Secondary | ICD-10-CM | POA: Diagnosis not present

## 2018-10-30 DIAGNOSIS — N184 Chronic kidney disease, stage 4 (severe): Secondary | ICD-10-CM | POA: Diagnosis not present

## 2018-10-31 DIAGNOSIS — N184 Chronic kidney disease, stage 4 (severe): Secondary | ICD-10-CM | POA: Diagnosis not present

## 2018-11-01 DIAGNOSIS — N184 Chronic kidney disease, stage 4 (severe): Secondary | ICD-10-CM | POA: Diagnosis not present

## 2018-11-02 DIAGNOSIS — N184 Chronic kidney disease, stage 4 (severe): Secondary | ICD-10-CM | POA: Diagnosis not present

## 2018-11-03 DIAGNOSIS — N184 Chronic kidney disease, stage 4 (severe): Secondary | ICD-10-CM | POA: Diagnosis not present

## 2018-11-04 DIAGNOSIS — N184 Chronic kidney disease, stage 4 (severe): Secondary | ICD-10-CM | POA: Diagnosis not present

## 2018-11-05 DIAGNOSIS — N184 Chronic kidney disease, stage 4 (severe): Secondary | ICD-10-CM | POA: Diagnosis not present

## 2018-11-06 DIAGNOSIS — N184 Chronic kidney disease, stage 4 (severe): Secondary | ICD-10-CM | POA: Diagnosis not present

## 2018-11-07 DIAGNOSIS — N184 Chronic kidney disease, stage 4 (severe): Secondary | ICD-10-CM | POA: Diagnosis not present

## 2018-11-08 DIAGNOSIS — N184 Chronic kidney disease, stage 4 (severe): Secondary | ICD-10-CM | POA: Diagnosis not present

## 2018-11-08 MED FILL — LANTUS 100 UNITS/ML VIAL: 100 | 27 days supply | Qty: 10 | Fill #3 | Status: TO

## 2018-11-08 MED FILL — UNIFINE PENTIPS 32GX5/32": 32G X 4 MM | 30 days supply | Qty: 30 | Fill #4 | Status: TO

## 2018-11-08 MED FILL — hydrALAZINE HCL 100 MG TABS: 100 | 30 days supply | Qty: 90 | Fill #4 | Status: TO

## 2018-11-08 MED FILL — UNIFINE PENTIPS 32GX5/32: 32G X 4 MM | 30 days supply | Qty: 30 | Fill #4 | Status: TO

## 2018-11-08 MED FILL — NovoLOG 100 UNIT/ML SOLN: 100 | 22 days supply | Qty: 10 | Fill #2 | Status: TO

## 2018-11-09 DIAGNOSIS — N184 Chronic kidney disease, stage 4 (severe): Secondary | ICD-10-CM | POA: Diagnosis not present

## 2018-11-10 DIAGNOSIS — N184 Chronic kidney disease, stage 4 (severe): Secondary | ICD-10-CM | POA: Diagnosis not present

## 2018-11-11 DIAGNOSIS — N184 Chronic kidney disease, stage 4 (severe): Secondary | ICD-10-CM | POA: Diagnosis not present

## 2018-11-12 DIAGNOSIS — N184 Chronic kidney disease, stage 4 (severe): Secondary | ICD-10-CM | POA: Diagnosis not present

## 2018-11-12 MED FILL — cloNIDine HCL 0.3 MG TABS: 0.3 | 30 days supply | Qty: 60 | Fill #1 | Status: TO

## 2018-11-12 MED FILL — AMLODIPINE BESYLATE 10 MG T: 10 | 30 days supply | Qty: 30 | Fill #3

## 2018-11-12 MED FILL — ATORVASTATIN 80 MG TABLET: 80 | 30 days supply | Qty: 30 | Fill #3 | Status: TO

## 2018-11-12 MED FILL — FUROSEMIDE 80 MG TABLET: 80 | 30 days supply | Qty: 60 | Fill #1 | Status: TO

## 2018-11-13 DIAGNOSIS — N184 Chronic kidney disease, stage 4 (severe): Secondary | ICD-10-CM | POA: Diagnosis not present

## 2018-11-14 DIAGNOSIS — N184 Chronic kidney disease, stage 4 (severe): Secondary | ICD-10-CM | POA: Diagnosis not present

## 2018-11-15 DIAGNOSIS — N184 Chronic kidney disease, stage 4 (severe): Secondary | ICD-10-CM | POA: Diagnosis not present

## 2018-11-16 DIAGNOSIS — N184 Chronic kidney disease, stage 4 (severe): Secondary | ICD-10-CM | POA: Diagnosis not present

## 2018-11-17 DIAGNOSIS — N184 Chronic kidney disease, stage 4 (severe): Secondary | ICD-10-CM | POA: Diagnosis not present

## 2018-11-18 DIAGNOSIS — N184 Chronic kidney disease, stage 4 (severe): Secondary | ICD-10-CM | POA: Diagnosis not present

## 2018-11-19 DIAGNOSIS — N184 Chronic kidney disease, stage 4 (severe): Secondary | ICD-10-CM | POA: Diagnosis not present

## 2018-11-20 DIAGNOSIS — N184 Chronic kidney disease, stage 4 (severe): Secondary | ICD-10-CM | POA: Diagnosis not present

## 2018-11-21 DIAGNOSIS — N184 Chronic kidney disease, stage 4 (severe): Secondary | ICD-10-CM | POA: Diagnosis not present

## 2018-11-22 DIAGNOSIS — N184 Chronic kidney disease, stage 4 (severe): Secondary | ICD-10-CM | POA: Diagnosis not present

## 2018-11-23 ENCOUNTER — Other Ambulatory Visit (HOSPITAL_COMMUNITY): Payer: Self-pay | Admitting: Cardiology

## 2018-11-23 DIAGNOSIS — N184 Chronic kidney disease, stage 4 (severe): Secondary | ICD-10-CM | POA: Diagnosis not present

## 2018-11-23 MED FILL — CARVEDILOL 25 MG TABLET: 25 | 30 days supply | Qty: 60 | Fill #3 | Status: TO

## 2018-11-24 DIAGNOSIS — N184 Chronic kidney disease, stage 4 (severe): Secondary | ICD-10-CM | POA: Diagnosis not present

## 2018-11-24 MED FILL — POTASSIUM CHLORIDE CRYS ER: 20 | 90 days supply | Qty: 90 | Fill #0

## 2018-11-25 DIAGNOSIS — N184 Chronic kidney disease, stage 4 (severe): Secondary | ICD-10-CM | POA: Diagnosis not present

## 2018-11-26 DIAGNOSIS — N184 Chronic kidney disease, stage 4 (severe): Secondary | ICD-10-CM | POA: Diagnosis not present

## 2018-11-27 DIAGNOSIS — N184 Chronic kidney disease, stage 4 (severe): Secondary | ICD-10-CM | POA: Diagnosis not present

## 2018-11-28 DIAGNOSIS — N184 Chronic kidney disease, stage 4 (severe): Secondary | ICD-10-CM | POA: Diagnosis not present

## 2018-11-29 DIAGNOSIS — N184 Chronic kidney disease, stage 4 (severe): Secondary | ICD-10-CM | POA: Diagnosis not present

## 2018-11-30 DIAGNOSIS — N184 Chronic kidney disease, stage 4 (severe): Secondary | ICD-10-CM | POA: Diagnosis not present

## 2018-11-30 MED FILL — TACROLIMUS 1 MG CAPSULE: 1 | 30 days supply | Qty: 240 | Fill #0

## 2018-12-01 DIAGNOSIS — N184 Chronic kidney disease, stage 4 (severe): Secondary | ICD-10-CM | POA: Diagnosis not present

## 2018-12-02 DIAGNOSIS — N184 Chronic kidney disease, stage 4 (severe): Secondary | ICD-10-CM | POA: Diagnosis not present

## 2018-12-03 DIAGNOSIS — N184 Chronic kidney disease, stage 4 (severe): Secondary | ICD-10-CM | POA: Diagnosis not present

## 2018-12-04 DIAGNOSIS — N184 Chronic kidney disease, stage 4 (severe): Secondary | ICD-10-CM | POA: Diagnosis not present

## 2018-12-05 DIAGNOSIS — N184 Chronic kidney disease, stage 4 (severe): Secondary | ICD-10-CM | POA: Diagnosis not present

## 2018-12-06 ENCOUNTER — Encounter: Payer: Self-pay | Admitting: Internal Medicine

## 2018-12-06 ENCOUNTER — Other Ambulatory Visit (HOSPITAL_COMMUNITY)
Admission: RE | Admit: 2018-12-06 | Discharge: 2018-12-06 | Disposition: A | Discharge status: Home / Self Care | Payer: Medicaid Other | Source: Ambulatory Visit | Attending: Oncology | Admitting: Oncology

## 2018-12-06 ENCOUNTER — Other Ambulatory Visit: Payer: Self-pay

## 2018-12-06 ENCOUNTER — Ambulatory Visit: Payer: Medicaid Other | Admitting: Internal Medicine

## 2018-12-06 VITALS — BP 129/75 | HR 69 | Temp 98.1°F | Ht 77.0 in | Wt 275.4 lb

## 2018-12-06 DIAGNOSIS — A5900 Urogenital trichomoniasis, unspecified: Secondary | ICD-10-CM

## 2018-12-06 DIAGNOSIS — E1165 Type 2 diabetes mellitus with hyperglycemia: Secondary | ICD-10-CM | POA: Diagnosis not present

## 2018-12-06 DIAGNOSIS — R300 Dysuria: Secondary | ICD-10-CM | POA: Diagnosis not present

## 2018-12-06 DIAGNOSIS — Z7251 High risk heterosexual behavior: Secondary | ICD-10-CM | POA: Diagnosis not present

## 2018-12-06 DIAGNOSIS — E1121 Type 2 diabetes mellitus with diabetic nephropathy: Secondary | ICD-10-CM | POA: Diagnosis not present

## 2018-12-06 DIAGNOSIS — N184 Chronic kidney disease, stage 4 (severe): Secondary | ICD-10-CM | POA: Diagnosis not present

## 2018-12-06 DIAGNOSIS — Z794 Long term (current) use of insulin: Secondary | ICD-10-CM

## 2018-12-06 LAB — POCT GLYCOSYLATED HEMOGLOBIN (HGB A1C): HbA1c POC (<> result, manual entry): >14 % (ref 4.0–5.6)

## 2018-12-06 LAB — BASIC METABOLIC PANEL
Anion gap: 11 (ref 5–15)
BUN: 46 mg/dL — ABNORMAL HIGH (ref 6–20)
CHLORIDE: 95 mmol/L — AB (ref 98–111)
CO2: 23 mmol/L (ref 22–32)
Calcium: 8.6 mg/dL — ABNORMAL LOW (ref 8.9–10.3)
Creatinine, Ser: 2.34 mg/dL — ABNORMAL HIGH (ref 0.61–1.24)
GFR calc Af Amer: 39 mL/min — ABNORMAL LOW (ref >60)
GFR calc non Af Amer: 34 mL/min — ABNORMAL LOW (ref >60)
Glucose, Bld: 587 mg/dL (ref 70–99)
Potassium: 4.2 mmol/L (ref 3.5–5.1)
Sodium: 129 mmol/L — ABNORMAL LOW (ref 135–145)

## 2018-12-06 LAB — GLUCOSE, CAPILLARY
Glucose-Capillary: >600 mg/dL (ref 70–99)
Glucose-Capillary: 482 mg/dL — ABNORMAL HIGH (ref 70–99)

## 2018-12-06 MED ORDER — LIRAGLUTIDE 18 MG/3ML ~~LOC~~ SOPN: 0.6000 mg | PEN_INJECTOR | Freq: Every day | SUBCUTANEOUS | 0 refills | Status: DC

## 2018-12-06 MED ORDER — INSULIN ASPART 100 UNIT/ML ~~LOC~~ SOLN
15.0000 [IU] | Freq: Once | SUBCUTANEOUS | Status: AC
  Administered 2018-12-06: 15 [IU] via SUBCUTANEOUS

## 2018-12-06 MED FILL — VICTOZA 18 MG/3 ML INJECT P: 18 | 30 days supply | Qty: 3 | Fill #0

## 2018-12-06 NOTE — Assessment & Plan Note (Addendum)
Trichomonas diagnosed in his sexual partner.  He was told about that last week.  Had some itching but has not noted discharge or burning with urination.  No fevers or chills. He has noticed no ulcerations or skin changes.  Was sexually active without a condom.  He is worried that she is unfaithful.    -check RPR, HIV, GC/Chlam, trichomonas -will follow up results, call pt and treat if needed

## 2018-12-06 NOTE — Progress Notes (Signed)
CC: T2DM, high risk heterosexual behavior  HPI:  JeremyJeremy [name] is a 38 y.o. male with PMH below.  Today we will address T2DM, high risk heterosexual behavior.    Please see A&P for status of the patient's chronic medical conditions  Past Medical History:  Diagnosis Date  . Abscess of left groin   . Acute blood loss anemia 11/11/2013  . Anemia in chronic kidney disease (CKD)   . Asthma   . Boil of scrotum 11/21/2015  . Chest pain    a. 01/2015 Lexiscan MV: EF 28%, inferior, inferolateral, apical ischemia;  b. 01/2015 Cath: nl cors, PCWP 18 mmHg, CO 9.38 L/min, CI 3.53 L/min/m^2.  . Essential hypertension   . Hyperlipidemia   . Membranous glomerulonephritis   . Morbid obesity (Jeremy [name])   . Nonischemic cardiomyopathy (Jeremy [name])    a. 01/2015 Echo: EF 20-25%, diff HK, Gr 2 DD, Triv AI, mildly dil LA and Ao root.  . Type II diabetes mellitus (Jeremy [name])    a. 01/2015 HbA1c = 8.9.   Review of Systems:  ROS: Pulmonary: pt denies increased work of breathing, shortness of breath,  Cardiac: pt denies palpitations, chest pain,  Abdominal: pt denies abdominal pain, nausea, vomiting, or diarrhea   Physical Exam:  Vitals:   12/06/18 1420  BP: 129/75  Pulse: 69  Temp: 98.1 F (36.7 C)  TempSrc: Oral  SpO2: 100%  Weight: 275 lb 6.4 oz (124.9 kg)  Height: 6\' 5"  (1.956 m)   Cardiac: normal rate and rhythm, clear s1 and s2, no murmurs, rubs or gallops Pulmonary: CTAB, not in distress Abdominal: non distended abdomen, soft and nontender Extremities: no LE edema Psych: Alert, conversant, in good spirits   Social History   Socioeconomic History  . Marital status: Single    Spouse name: Not on file  . Number of children: Not on file  . Years of education: Not on file  . Highest education level: Not on file  Occupational History  . Not on file  Social Needs  . Financial resource strain: Not on file  . Food insecurity:    Worry: Not on file    Inability: Not on file  . Transportation  needs:    Medical: Not on file    Non-medical: Not on file  Tobacco Use  . Smoking status: Never Smoker  . Smokeless tobacco: Never Used  Substance and Sexual Activity  . Alcohol use: No    Alcohol/week: 0.0 standard drinks  . Drug use: Yes    Types: Marijuana    Comment: smoked marijuana 2-3 x/wk.  [name] Sexual activity: Yes    Partners: Female  Lifestyle  . Physical activity:    Days per week: Not on file    Minutes per session: Not on file  . Stress: Not on file  Relationships  . Social connections:    Talks on phone: Not on file    Gets together: Not on file    Attends religious service: Not on file    Active member of club or organization: Not on file    Attends meetings of clubs or organizations: Not on file    Relationship status: Not on file  . Intimate partner violence:    Fear of current or ex partner: Not on file    Emotionally abused: Not on file    Physically abused: Not on file    Forced sexual activity: Not on file  Other Topics Concern  . Not on file  Social History  Narrative   Lives in [place] by himself.  Currently in [facility]        Family History  Problem Relation Age of Onset  . Diabetes Mellitus II Mother        died @ 77.  . Gastric cancer Mother   . CAD Father        died @ 32.  [name] Heart attack Father   . Congestive Heart Failure Father   . Diabetes Mellitus II Sister   . CAD Sister        s/p PCI - age 4.    Assessment & Plan:   See Encounters Tab for problem based charting.  Patient discussed with Dr. Beryle [name]

## 2018-12-06 NOTE — Assessment & Plan Note (Signed)
Patient has a history of poorly controlled diabetes.  We were making some progress once we introduce Victoza and titrated it up.  His A1c back in December 2019 was 9.4.  Today it is greater than 14 and his CBG is greater than 600.  He tells me that he is mostly consistent with his insulin on basal bolus 36 and 15.  He also is on glipizide 5, I think based on his most recent blood sugars it is unlikely that he is compliant with insulin he mentions to me that he has had some side effects with the Victoza namely some chest discomfort and improving and he took a day off of it.  After that he stopped taking it altogether.  He also mentioned he did not like how it curbed his appetite he wants to stay the same size.  I am sure this did have a negative effect however it is more likely that it is the missed doses of insulin that are contributing.  Did not bring a meter or blood sugar log today.  Stat BMP which came back negative for acidosis, renal function baseline.  Patient completely asymptomatic, I expect he has ceased high spikes often.  He reported eating a lot of cake before his visit.  We gave him 15 minutes of NovoLog here in the clinic, his sugar came down to 480.  He says he will take another dose of NovoLog at home.    -Gave patient a printout of the blood sugar log for him to fill out at home over the next few days.  We will call him via telephone and go over the log with him as I will now be on the acute care clinic rotation. -I restarted the patient's Victoza 0.6 mg as he was agreeable to trying a lower dose.  Hopefully I will be able to titrate up to at least 1.2 mg daily

## 2018-12-06 NOTE — Patient Instructions (Addendum)
Jeremy [name], we need to get your diabetes under better control.  I have given you a blood sugar log to help [place] determine which medications we should change and by how much.  I have also sent testing for sexually transmitted diseases.  I will call you with the results and send in any antibiotics that you may need.  We will need to call you in about 5 days to see how your diabetes is doing, please have the log available so we can titrate your medicines.

## 2018-12-07 ENCOUNTER — Other Ambulatory Visit: Payer: Self-pay

## 2018-12-07 ENCOUNTER — Telehealth: Payer: Self-pay | Admitting: Internal Medicine

## 2018-12-07 DIAGNOSIS — N184 Chronic kidney disease, stage 4 (severe): Secondary | ICD-10-CM | POA: Diagnosis not present

## 2018-12-07 LAB — RPR: RPR Ser Ql: NONREACTIVE

## 2018-12-07 LAB — URINE CYTOLOGY ANCILLARY ONLY
Chlamydia: NEGATIVE
Neisseria Gonorrhea: NEGATIVE
Trichomonas: NEGATIVE

## 2018-12-07 LAB — HIV ANTIBODY (ROUTINE TESTING W REFLEX): HIV Screen 4th Generation wRfx: NONREACTIVE

## 2018-12-07 NOTE — Progress Notes (Signed)
Medicine attending: Medical history, presenting problems, physical findings, and medications, reviewed with resident physician Dr Guadlupe Spanish on the day of the patient visit and I concur with his evaluation and management plan. Poorly compliant patient. Glucose >600 today. Insulin given in clinic. He appears euhydrate. Stable for further management as outpatient. He has never been ketotic.

## 2018-12-07 NOTE — Telephone Encounter (Signed)
Went over results of STI studies with patient.  CBG's at home improved to 200's, we will continue titrating insulin, he will also pick up victoza today.

## 2018-12-08 DIAGNOSIS — N184 Chronic kidney disease, stage 4 (severe): Secondary | ICD-10-CM | POA: Diagnosis not present

## 2018-12-09 DIAGNOSIS — N184 Chronic kidney disease, stage 4 (severe): Secondary | ICD-10-CM | POA: Diagnosis not present

## 2018-12-10 DIAGNOSIS — N184 Chronic kidney disease, stage 4 (severe): Secondary | ICD-10-CM | POA: Diagnosis not present

## 2018-12-11 DIAGNOSIS — N184 Chronic kidney disease, stage 4 (severe): Secondary | ICD-10-CM | POA: Diagnosis not present

## 2018-12-12 DIAGNOSIS — N184 Chronic kidney disease, stage 4 (severe): Secondary | ICD-10-CM | POA: Diagnosis not present

## 2018-12-13 DIAGNOSIS — N184 Chronic kidney disease, stage 4 (severe): Secondary | ICD-10-CM | POA: Diagnosis not present

## 2018-12-13 MED FILL — ATORVASTATIN 80 MG TABLET: 80 | 30 days supply | Qty: 30 | Fill #0

## 2018-12-13 MED FILL — VICTOZA 18 MG/3 ML INJECT P: 18 | 30 days supply | Qty: 9 | Fill #0

## 2018-12-14 DIAGNOSIS — N184 Chronic kidney disease, stage 4 (severe): Secondary | ICD-10-CM | POA: Diagnosis not present

## 2018-12-15 DIAGNOSIS — N184 Chronic kidney disease, stage 4 (severe): Secondary | ICD-10-CM | POA: Diagnosis not present

## 2018-12-16 DIAGNOSIS — Z6835 Body mass index (BMI) 35.0-35.9, adult: Secondary | ICD-10-CM | POA: Diagnosis not present

## 2018-12-16 DIAGNOSIS — E785 Hyperlipidemia, unspecified: Secondary | ICD-10-CM | POA: Diagnosis not present

## 2018-12-16 DIAGNOSIS — E1129 Type 2 diabetes mellitus with other diabetic kidney complication: Secondary | ICD-10-CM | POA: Diagnosis not present

## 2018-12-16 DIAGNOSIS — N052 Unspecified nephritic syndrome with diffuse membranous glomerulonephritis: Secondary | ICD-10-CM | POA: Diagnosis not present

## 2018-12-16 DIAGNOSIS — N184 Chronic kidney disease, stage 4 (severe): Secondary | ICD-10-CM | POA: Diagnosis not present

## 2018-12-16 DIAGNOSIS — I5022 Chronic systolic (congestive) heart failure: Secondary | ICD-10-CM | POA: Diagnosis not present

## 2018-12-16 DIAGNOSIS — I129 Hypertensive chronic kidney disease with stage 1 through stage 4 chronic kidney disease, or unspecified chronic kidney disease: Secondary | ICD-10-CM | POA: Diagnosis not present

## 2018-12-16 DIAGNOSIS — N2581 Secondary hyperparathyroidism of renal origin: Secondary | ICD-10-CM | POA: Diagnosis not present

## 2018-12-17 DIAGNOSIS — N184 Chronic kidney disease, stage 4 (severe): Secondary | ICD-10-CM | POA: Diagnosis not present

## 2018-12-18 DIAGNOSIS — N184 Chronic kidney disease, stage 4 (severe): Secondary | ICD-10-CM | POA: Diagnosis not present

## 2018-12-19 DIAGNOSIS — N184 Chronic kidney disease, stage 4 (severe): Secondary | ICD-10-CM | POA: Diagnosis not present

## 2018-12-20 DIAGNOSIS — N184 Chronic kidney disease, stage 4 (severe): Secondary | ICD-10-CM | POA: Diagnosis not present

## 2018-12-20 MED FILL — LANTUS 100 UNITS/ML VIAL: 100 | 27 days supply | Qty: 10 | Fill #0

## 2018-12-20 MED FILL — UNIFINE PENTIPS 32GX5/32": 32G X 4 MM | 90 days supply | Qty: 100 | Fill #0

## 2018-12-20 MED FILL — NovoLOG 100 UNIT/ML SOLN: 100 | 22 days supply | Qty: 10 | Fill #0

## 2018-12-20 MED FILL — UNIFINE PENTIPS 32GX5/32: 32G X 4 MM | 90 days supply | Qty: 100 | Fill #0

## 2018-12-21 DIAGNOSIS — N184 Chronic kidney disease, stage 4 (severe): Secondary | ICD-10-CM | POA: Diagnosis not present

## 2018-12-22 DIAGNOSIS — N184 Chronic kidney disease, stage 4 (severe): Secondary | ICD-10-CM | POA: Diagnosis not present

## 2018-12-23 DIAGNOSIS — N184 Chronic kidney disease, stage 4 (severe): Secondary | ICD-10-CM | POA: Diagnosis not present

## 2018-12-24 DIAGNOSIS — N184 Chronic kidney disease, stage 4 (severe): Secondary | ICD-10-CM | POA: Diagnosis not present

## 2018-12-24 MED FILL — FUROSEMIDE 80 MG TAB: 80 | 30 days supply | Qty: 60 | Fill #0

## 2018-12-25 DIAGNOSIS — N184 Chronic kidney disease, stage 4 (severe): Secondary | ICD-10-CM | POA: Diagnosis not present

## 2018-12-26 DIAGNOSIS — N184 Chronic kidney disease, stage 4 (severe): Secondary | ICD-10-CM | POA: Diagnosis not present

## 2018-12-27 DIAGNOSIS — N184 Chronic kidney disease, stage 4 (severe): Secondary | ICD-10-CM | POA: Diagnosis not present

## 2018-12-27 MED FILL — CARVEDILOL 25 MG TABLET: 25 | 30 days supply | Qty: 60 | Fill #0

## 2018-12-27 MED FILL — hydrALAZINE HCL 100 MG TABS: 100 | 30 days supply | Qty: 90 | Fill #0

## 2018-12-28 DIAGNOSIS — N184 Chronic kidney disease, stage 4 (severe): Secondary | ICD-10-CM | POA: Diagnosis not present

## 2018-12-29 DIAGNOSIS — N184 Chronic kidney disease, stage 4 (severe): Secondary | ICD-10-CM | POA: Diagnosis not present

## 2018-12-29 MED FILL — TACROLIMUS 1 MG CAPSULE: 1 | 30 days supply | Qty: 240 | Fill #1

## 2018-12-30 DIAGNOSIS — N184 Chronic kidney disease, stage 4 (severe): Secondary | ICD-10-CM | POA: Diagnosis not present

## 2018-12-31 DIAGNOSIS — N184 Chronic kidney disease, stage 4 (severe): Secondary | ICD-10-CM | POA: Diagnosis not present

## 2019-01-01 DIAGNOSIS — N184 Chronic kidney disease, stage 4 (severe): Secondary | ICD-10-CM | POA: Diagnosis not present

## 2019-01-02 DIAGNOSIS — N184 Chronic kidney disease, stage 4 (severe): Secondary | ICD-10-CM | POA: Diagnosis not present

## 2019-01-03 DIAGNOSIS — N184 Chronic kidney disease, stage 4 (severe): Secondary | ICD-10-CM | POA: Diagnosis not present

## 2019-01-04 DIAGNOSIS — N184 Chronic kidney disease, stage 4 (severe): Secondary | ICD-10-CM | POA: Diagnosis not present

## 2019-01-05 DIAGNOSIS — N184 Chronic kidney disease, stage 4 (severe): Secondary | ICD-10-CM | POA: Diagnosis not present

## 2019-01-06 DIAGNOSIS — N184 Chronic kidney disease, stage 4 (severe): Secondary | ICD-10-CM | POA: Diagnosis not present

## 2019-01-07 DIAGNOSIS — N184 Chronic kidney disease, stage 4 (severe): Secondary | ICD-10-CM | POA: Diagnosis not present

## 2019-01-08 DIAGNOSIS — N184 Chronic kidney disease, stage 4 (severe): Secondary | ICD-10-CM | POA: Diagnosis not present

## 2019-01-09 DIAGNOSIS — N184 Chronic kidney disease, stage 4 (severe): Secondary | ICD-10-CM | POA: Diagnosis not present

## 2019-01-10 DIAGNOSIS — N184 Chronic kidney disease, stage 4 (severe): Secondary | ICD-10-CM | POA: Diagnosis not present

## 2019-01-11 DIAGNOSIS — N184 Chronic kidney disease, stage 4 (severe): Secondary | ICD-10-CM | POA: Diagnosis not present

## 2019-01-12 DIAGNOSIS — N184 Chronic kidney disease, stage 4 (severe): Secondary | ICD-10-CM | POA: Diagnosis not present

## 2019-01-12 MED FILL — ATORVASTATIN 80 MG TABLET: 80 | 30 days supply | Qty: 30 | Fill #1

## 2019-01-12 MED FILL — glipiZIDE 5 MG TABS: 5 | 30 days supply | Qty: 30 | Fill #0

## 2019-01-13 DIAGNOSIS — N184 Chronic kidney disease, stage 4 (severe): Secondary | ICD-10-CM | POA: Diagnosis not present

## 2019-01-14 DIAGNOSIS — N184 Chronic kidney disease, stage 4 (severe): Secondary | ICD-10-CM | POA: Diagnosis not present

## 2019-01-15 DIAGNOSIS — N184 Chronic kidney disease, stage 4 (severe): Secondary | ICD-10-CM | POA: Diagnosis not present

## 2019-01-16 DIAGNOSIS — N184 Chronic kidney disease, stage 4 (severe): Secondary | ICD-10-CM | POA: Diagnosis not present

## 2019-01-17 DIAGNOSIS — N184 Chronic kidney disease, stage 4 (severe): Secondary | ICD-10-CM | POA: Diagnosis not present

## 2019-01-18 DIAGNOSIS — N184 Chronic kidney disease, stage 4 (severe): Secondary | ICD-10-CM | POA: Diagnosis not present

## 2019-01-19 DIAGNOSIS — N184 Chronic kidney disease, stage 4 (severe): Secondary | ICD-10-CM | POA: Diagnosis not present

## 2019-01-20 DIAGNOSIS — N184 Chronic kidney disease, stage 4 (severe): Secondary | ICD-10-CM | POA: Diagnosis not present

## 2019-01-21 ENCOUNTER — Other Ambulatory Visit (HOSPITAL_COMMUNITY): Payer: Self-pay | Admitting: Cardiology

## 2019-01-21 DIAGNOSIS — N184 Chronic kidney disease, stage 4 (severe): Secondary | ICD-10-CM | POA: Diagnosis not present

## 2019-01-22 DIAGNOSIS — N184 Chronic kidney disease, stage 4 (severe): Secondary | ICD-10-CM | POA: Diagnosis not present

## 2019-01-23 DIAGNOSIS — N184 Chronic kidney disease, stage 4 (severe): Secondary | ICD-10-CM | POA: Diagnosis not present

## 2019-01-24 DIAGNOSIS — N184 Chronic kidney disease, stage 4 (severe): Secondary | ICD-10-CM | POA: Diagnosis not present

## 2019-01-25 DIAGNOSIS — N184 Chronic kidney disease, stage 4 (severe): Secondary | ICD-10-CM | POA: Diagnosis not present

## 2019-01-26 DIAGNOSIS — N184 Chronic kidney disease, stage 4 (severe): Secondary | ICD-10-CM | POA: Diagnosis not present

## 2019-01-27 DIAGNOSIS — N184 Chronic kidney disease, stage 4 (severe): Secondary | ICD-10-CM | POA: Diagnosis not present

## 2019-01-28 ENCOUNTER — Other Ambulatory Visit (HOSPITAL_COMMUNITY): Payer: Self-pay | Admitting: Cardiology

## 2019-01-28 DIAGNOSIS — N184 Chronic kidney disease, stage 4 (severe): Secondary | ICD-10-CM | POA: Diagnosis not present

## 2019-01-28 DIAGNOSIS — I5042 Chronic combined systolic (congestive) and diastolic (congestive) heart failure: Secondary | ICD-10-CM

## 2019-01-28 MED FILL — NovoLOG 100 UNIT/ML SOLN: 100 | 22 days supply | Qty: 10 | Fill #1

## 2019-01-28 MED FILL — BD INSULIN SYR 0.5 ML 30GX1: 30G X 1/2" | 30 days supply | Qty: 120 | Fill #0

## 2019-01-28 MED FILL — FUROSEMIDE 80 MG TAB: 80 | 30 days supply | Qty: 60 | Fill #1

## 2019-01-28 MED FILL — TACROLIMUS 1 MG CAPSULE: 1 | 30 days supply | Qty: 240 | Fill #2

## 2019-01-28 MED FILL — CARVEDILOL 25 MG TABLET: 25 | 30 days supply | Qty: 60 | Fill #1

## 2019-01-28 MED FILL — LANTUS 100 UNITS/ML VIAL: 100 | 27 days supply | Qty: 10 | Fill #1

## 2019-01-29 DIAGNOSIS — N184 Chronic kidney disease, stage 4 (severe): Secondary | ICD-10-CM | POA: Diagnosis not present

## 2019-01-30 DIAGNOSIS — N184 Chronic kidney disease, stage 4 (severe): Secondary | ICD-10-CM | POA: Diagnosis not present

## 2019-01-31 DIAGNOSIS — N184 Chronic kidney disease, stage 4 (severe): Secondary | ICD-10-CM | POA: Diagnosis not present

## 2019-02-01 DIAGNOSIS — N184 Chronic kidney disease, stage 4 (severe): Secondary | ICD-10-CM | POA: Diagnosis not present

## 2019-02-01 MED FILL — hydrALAZINE HCL 100 MG TABS: 100 | 30 days supply | Qty: 90 | Fill #0

## 2019-02-02 DIAGNOSIS — N184 Chronic kidney disease, stage 4 (severe): Secondary | ICD-10-CM | POA: Diagnosis not present

## 2019-02-03 DIAGNOSIS — N184 Chronic kidney disease, stage 4 (severe): Secondary | ICD-10-CM | POA: Diagnosis not present

## 2019-02-04 DIAGNOSIS — N184 Chronic kidney disease, stage 4 (severe): Secondary | ICD-10-CM | POA: Diagnosis not present

## 2019-02-05 DIAGNOSIS — N184 Chronic kidney disease, stage 4 (severe): Secondary | ICD-10-CM | POA: Diagnosis not present

## 2019-02-06 DIAGNOSIS — N184 Chronic kidney disease, stage 4 (severe): Secondary | ICD-10-CM | POA: Diagnosis not present

## 2019-02-07 DIAGNOSIS — N184 Chronic kidney disease, stage 4 (severe): Secondary | ICD-10-CM | POA: Diagnosis not present

## 2019-02-08 DIAGNOSIS — N184 Chronic kidney disease, stage 4 (severe): Secondary | ICD-10-CM | POA: Diagnosis not present

## 2019-02-09 DIAGNOSIS — N184 Chronic kidney disease, stage 4 (severe): Secondary | ICD-10-CM | POA: Diagnosis not present

## 2019-02-10 ENCOUNTER — Other Ambulatory Visit (HOSPITAL_COMMUNITY): Payer: Self-pay | Admitting: Cardiology

## 2019-02-10 DIAGNOSIS — N184 Chronic kidney disease, stage 4 (severe): Secondary | ICD-10-CM | POA: Diagnosis not present

## 2019-02-10 MED FILL — ATORVASTATIN 80 MG TABLET: 80 | 30 days supply | Qty: 30 | Fill #2

## 2019-02-11 ENCOUNTER — Other Ambulatory Visit: Payer: Self-pay | Admitting: Internal Medicine

## 2019-02-11 DIAGNOSIS — N184 Chronic kidney disease, stage 4 (severe): Secondary | ICD-10-CM | POA: Diagnosis not present

## 2019-02-11 DIAGNOSIS — E1121 Type 2 diabetes mellitus with diabetic nephropathy: Secondary | ICD-10-CM

## 2019-02-11 DIAGNOSIS — Z794 Long term (current) use of insulin: Secondary | ICD-10-CM

## 2019-02-11 MED ORDER — GLIPIZIDE 5 MG PO TABS: 5.0000 mg | ORAL_TABLET | Freq: Every day | ORAL | 3 refills | Status: DC

## 2019-02-11 NOTE — Telephone Encounter (Signed)
Returned call to patient. States he is completely out of glipizide and pharmacy told him they have requested refill several days ago with no response. No request received through [facility]. First notification of refill request was when patient called today. Will forward to Attending as PCP unavailable at present. Now requesting refill be sent to [facility] at [facility].  Hubbard Hartshorn, RN, BSN

## 2019-02-11 NOTE — Telephone Encounter (Signed)
Patient notified that Rx has been sent. Hubbard Hartshorn, RN, BSN

## 2019-02-11 NOTE — Telephone Encounter (Signed)
Needs refill on glipiZIDE (GLUCOTROL) 5 MG tablet  [place], [place] - [address] [facility]. ;pt contact [phone]

## 2019-02-11 NOTE — Telephone Encounter (Signed)
Pt is calling back regarding medicine, pls contact pt [phone]

## 2019-02-11 NOTE — Telephone Encounter (Signed)
refilled 

## 2019-02-12 DIAGNOSIS — N184 Chronic kidney disease, stage 4 (severe): Secondary | ICD-10-CM | POA: Diagnosis not present

## 2019-02-13 DIAGNOSIS — N184 Chronic kidney disease, stage 4 (severe): Secondary | ICD-10-CM | POA: Diagnosis not present

## 2019-02-14 DIAGNOSIS — N184 Chronic kidney disease, stage 4 (severe): Secondary | ICD-10-CM | POA: Diagnosis not present

## 2019-02-14 MED FILL — POTASSIUM CHLORIDE CRYS ER: 20 | 90 days supply | Qty: 90 | Fill #0

## 2019-02-15 DIAGNOSIS — N184 Chronic kidney disease, stage 4 (severe): Secondary | ICD-10-CM | POA: Diagnosis not present

## 2019-02-16 DIAGNOSIS — N184 Chronic kidney disease, stage 4 (severe): Secondary | ICD-10-CM | POA: Diagnosis not present

## 2019-02-17 DIAGNOSIS — N184 Chronic kidney disease, stage 4 (severe): Secondary | ICD-10-CM | POA: Diagnosis not present

## 2019-02-18 DIAGNOSIS — N184 Chronic kidney disease, stage 4 (severe): Secondary | ICD-10-CM | POA: Diagnosis not present

## 2019-02-19 DIAGNOSIS — N184 Chronic kidney disease, stage 4 (severe): Secondary | ICD-10-CM | POA: Diagnosis not present

## 2019-02-20 DIAGNOSIS — N184 Chronic kidney disease, stage 4 (severe): Secondary | ICD-10-CM | POA: Diagnosis not present

## 2019-02-21 DIAGNOSIS — N184 Chronic kidney disease, stage 4 (severe): Secondary | ICD-10-CM | POA: Diagnosis not present

## 2019-02-22 DIAGNOSIS — N184 Chronic kidney disease, stage 4 (severe): Secondary | ICD-10-CM | POA: Diagnosis not present

## 2019-02-23 DIAGNOSIS — N184 Chronic kidney disease, stage 4 (severe): Secondary | ICD-10-CM | POA: Diagnosis not present

## 2019-02-24 DIAGNOSIS — N184 Chronic kidney disease, stage 4 (severe): Secondary | ICD-10-CM | POA: Diagnosis not present

## 2019-02-25 DIAGNOSIS — N184 Chronic kidney disease, stage 4 (severe): Secondary | ICD-10-CM | POA: Diagnosis not present

## 2019-02-26 DIAGNOSIS — N184 Chronic kidney disease, stage 4 (severe): Secondary | ICD-10-CM | POA: Diagnosis not present

## 2019-02-27 DIAGNOSIS — N184 Chronic kidney disease, stage 4 (severe): Secondary | ICD-10-CM | POA: Diagnosis not present

## 2019-02-28 DIAGNOSIS — N184 Chronic kidney disease, stage 4 (severe): Secondary | ICD-10-CM | POA: Diagnosis not present

## 2019-03-01 DIAGNOSIS — N184 Chronic kidney disease, stage 4 (severe): Secondary | ICD-10-CM | POA: Diagnosis not present

## 2019-03-02 DIAGNOSIS — N184 Chronic kidney disease, stage 4 (severe): Secondary | ICD-10-CM | POA: Diagnosis not present

## 2019-03-03 ENCOUNTER — Other Ambulatory Visit (HOSPITAL_COMMUNITY): Payer: Self-pay | Admitting: Cardiology

## 2019-03-03 DIAGNOSIS — N052 Unspecified nephritic syndrome with diffuse membranous glomerulonephritis: Secondary | ICD-10-CM | POA: Diagnosis not present

## 2019-03-03 DIAGNOSIS — N184 Chronic kidney disease, stage 4 (severe): Secondary | ICD-10-CM | POA: Diagnosis not present

## 2019-03-03 DIAGNOSIS — N2581 Secondary hyperparathyroidism of renal origin: Secondary | ICD-10-CM | POA: Diagnosis not present

## 2019-03-03 DIAGNOSIS — E785 Hyperlipidemia, unspecified: Secondary | ICD-10-CM | POA: Diagnosis not present

## 2019-03-03 DIAGNOSIS — E1129 Type 2 diabetes mellitus with other diabetic kidney complication: Secondary | ICD-10-CM | POA: Diagnosis not present

## 2019-03-03 DIAGNOSIS — I129 Hypertensive chronic kidney disease with stage 1 through stage 4 chronic kidney disease, or unspecified chronic kidney disease: Secondary | ICD-10-CM | POA: Diagnosis not present

## 2019-03-03 DIAGNOSIS — I5022 Chronic systolic (congestive) heart failure: Secondary | ICD-10-CM | POA: Diagnosis not present

## 2019-03-04 ENCOUNTER — Other Ambulatory Visit (HOSPITAL_COMMUNITY): Payer: Self-pay | Admitting: Internal Medicine

## 2019-03-04 ENCOUNTER — Other Ambulatory Visit (HOSPITAL_COMMUNITY): Payer: Self-pay | Admitting: Cardiology

## 2019-03-04 DIAGNOSIS — N184 Chronic kidney disease, stage 4 (severe): Secondary | ICD-10-CM | POA: Diagnosis not present

## 2019-03-04 DIAGNOSIS — I5042 Chronic combined systolic (congestive) and diastolic (congestive) heart failure: Secondary | ICD-10-CM

## 2019-03-04 MED ORDER — CARVEDILOL 25 MG PO TABS: ORAL_TABLET | ORAL | 5 refills | Status: DC

## 2019-03-04 MED ORDER — AMLODIPINE BESYLATE 10 MG PO TABS: 10.0000 mg | ORAL_TABLET | Freq: Every day | ORAL | 6 refills | Status: DC

## 2019-03-04 MED ORDER — NITROGLYCERIN 0.4 MG SL SUBL: SUBLINGUAL_TABLET | SUBLINGUAL | 3 refills | Status: DC

## 2019-03-04 MED FILL — TACROLIMUS 1 MG CAPSULE: 1 | 30 days supply | Qty: 240 | Fill #0

## 2019-03-04 MED FILL — AMLODIPINE BESYLATE 10 MG T: 10 | 30 days supply | Qty: 30 | Fill #0

## 2019-03-04 MED FILL — FUROSEMIDE 80 MG TABLET: 80 | 30 days supply | Qty: 60 | Fill #0

## 2019-03-04 MED FILL — CARVEDILOL 25 MG TABLET: 25 | 30 days supply | Qty: 60 | Fill #0

## 2019-03-04 MED FILL — NITROGLYCERIN 0.4 MG TAB SL: 0.4 | 20 days supply | Qty: 25 | Fill #0

## 2019-03-05 DIAGNOSIS — N184 Chronic kidney disease, stage 4 (severe): Secondary | ICD-10-CM | POA: Diagnosis not present

## 2019-03-06 DIAGNOSIS — N184 Chronic kidney disease, stage 4 (severe): Secondary | ICD-10-CM | POA: Diagnosis not present

## 2019-03-07 DIAGNOSIS — N184 Chronic kidney disease, stage 4 (severe): Secondary | ICD-10-CM | POA: Diagnosis not present

## 2019-03-08 DIAGNOSIS — N184 Chronic kidney disease, stage 4 (severe): Secondary | ICD-10-CM | POA: Diagnosis not present

## 2019-03-09 DIAGNOSIS — N184 Chronic kidney disease, stage 4 (severe): Secondary | ICD-10-CM | POA: Diagnosis not present

## 2019-03-10 DIAGNOSIS — N184 Chronic kidney disease, stage 4 (severe): Secondary | ICD-10-CM | POA: Diagnosis not present

## 2019-03-11 DIAGNOSIS — N184 Chronic kidney disease, stage 4 (severe): Secondary | ICD-10-CM | POA: Diagnosis not present

## 2019-03-12 DIAGNOSIS — N184 Chronic kidney disease, stage 4 (severe): Secondary | ICD-10-CM | POA: Diagnosis not present

## 2019-03-13 DIAGNOSIS — N184 Chronic kidney disease, stage 4 (severe): Secondary | ICD-10-CM | POA: Diagnosis not present

## 2019-03-14 DIAGNOSIS — N184 Chronic kidney disease, stage 4 (severe): Secondary | ICD-10-CM | POA: Diagnosis not present

## 2019-03-14 MED FILL — ATORVASTATIN 80 MG TABLET: 80 | 30 days supply | Qty: 30 | Fill #0

## 2019-03-15 DIAGNOSIS — N184 Chronic kidney disease, stage 4 (severe): Secondary | ICD-10-CM | POA: Diagnosis not present

## 2019-03-16 DIAGNOSIS — N184 Chronic kidney disease, stage 4 (severe): Secondary | ICD-10-CM | POA: Diagnosis not present

## 2019-03-17 DIAGNOSIS — N184 Chronic kidney disease, stage 4 (severe): Secondary | ICD-10-CM | POA: Diagnosis not present

## 2019-03-18 DIAGNOSIS — N184 Chronic kidney disease, stage 4 (severe): Secondary | ICD-10-CM | POA: Diagnosis not present

## 2019-03-19 DIAGNOSIS — N184 Chronic kidney disease, stage 4 (severe): Secondary | ICD-10-CM | POA: Diagnosis not present

## 2019-03-20 DIAGNOSIS — N184 Chronic kidney disease, stage 4 (severe): Secondary | ICD-10-CM | POA: Diagnosis not present

## 2019-03-21 DIAGNOSIS — N184 Chronic kidney disease, stage 4 (severe): Secondary | ICD-10-CM | POA: Diagnosis not present

## 2019-03-22 DIAGNOSIS — N184 Chronic kidney disease, stage 4 (severe): Secondary | ICD-10-CM | POA: Diagnosis not present

## 2019-03-23 DIAGNOSIS — N184 Chronic kidney disease, stage 4 (severe): Secondary | ICD-10-CM | POA: Diagnosis not present

## 2019-03-24 DIAGNOSIS — N184 Chronic kidney disease, stage 4 (severe): Secondary | ICD-10-CM | POA: Diagnosis not present

## 2019-03-25 DIAGNOSIS — N184 Chronic kidney disease, stage 4 (severe): Secondary | ICD-10-CM | POA: Diagnosis not present

## 2019-03-26 DIAGNOSIS — N184 Chronic kidney disease, stage 4 (severe): Secondary | ICD-10-CM | POA: Diagnosis not present

## 2019-03-27 DIAGNOSIS — N184 Chronic kidney disease, stage 4 (severe): Secondary | ICD-10-CM | POA: Diagnosis not present

## 2019-03-28 DIAGNOSIS — N184 Chronic kidney disease, stage 4 (severe): Secondary | ICD-10-CM | POA: Diagnosis not present

## 2019-03-29 DIAGNOSIS — N184 Chronic kidney disease, stage 4 (severe): Secondary | ICD-10-CM | POA: Diagnosis not present

## 2019-03-29 MED FILL — VICTOZA 18 MG/3 ML INJECT P: 18 | 30 days supply | Qty: 9 | Fill #0

## 2019-03-29 MED FILL — LANTUS 100 UNITS/ML VIAL: 100 | 27 days supply | Qty: 10 | Fill #0

## 2019-03-29 MED FILL — NovoLOG 100 UNIT/ML SOLN: 100 | 22 days supply | Qty: 10 | Fill #0

## 2019-03-30 DIAGNOSIS — N184 Chronic kidney disease, stage 4 (severe): Secondary | ICD-10-CM | POA: Diagnosis not present

## 2019-03-31 DIAGNOSIS — N184 Chronic kidney disease, stage 4 (severe): Secondary | ICD-10-CM | POA: Diagnosis not present

## 2019-03-31 MED FILL — FUROSEMIDE 80 MG TABLET: 80 | 30 days supply | Qty: 60 | Fill #1

## 2019-03-31 MED FILL — hydrALAZINE HCL 100 MG TABS: 100 | 30 days supply | Qty: 90 | Fill #0

## 2019-03-31 MED FILL — cloNIDine HCL 0.3 MG TABS: 0.3 | 30 days supply | Qty: 60 | Fill #0

## 2019-04-01 DIAGNOSIS — N184 Chronic kidney disease, stage 4 (severe): Secondary | ICD-10-CM | POA: Diagnosis not present

## 2019-04-01 MED FILL — CARVEDILOL 25 MG TABLET: 25 | 30 days supply | Qty: 60 | Fill #1

## 2019-04-02 DIAGNOSIS — N184 Chronic kidney disease, stage 4 (severe): Secondary | ICD-10-CM | POA: Diagnosis not present

## 2019-04-03 DIAGNOSIS — N184 Chronic kidney disease, stage 4 (severe): Secondary | ICD-10-CM | POA: Diagnosis not present

## 2019-04-04 DIAGNOSIS — N184 Chronic kidney disease, stage 4 (severe): Secondary | ICD-10-CM | POA: Diagnosis not present

## 2019-04-05 DIAGNOSIS — N184 Chronic kidney disease, stage 4 (severe): Secondary | ICD-10-CM | POA: Diagnosis not present

## 2019-04-06 DIAGNOSIS — N184 Chronic kidney disease, stage 4 (severe): Secondary | ICD-10-CM | POA: Diagnosis not present

## 2019-04-07 DIAGNOSIS — N184 Chronic kidney disease, stage 4 (severe): Secondary | ICD-10-CM | POA: Diagnosis not present

## 2019-04-08 DIAGNOSIS — N184 Chronic kidney disease, stage 4 (severe): Secondary | ICD-10-CM | POA: Diagnosis not present

## 2019-04-08 MED FILL — TACROLIMUS 1 MG CAPSULE: 1 | 15 days supply | Qty: 120 | Fill #1

## 2019-04-09 DIAGNOSIS — N184 Chronic kidney disease, stage 4 (severe): Secondary | ICD-10-CM | POA: Diagnosis not present

## 2019-04-10 DIAGNOSIS — N184 Chronic kidney disease, stage 4 (severe): Secondary | ICD-10-CM | POA: Diagnosis not present

## 2019-04-11 DIAGNOSIS — N184 Chronic kidney disease, stage 4 (severe): Secondary | ICD-10-CM | POA: Diagnosis not present

## 2019-04-12 DIAGNOSIS — N184 Chronic kidney disease, stage 4 (severe): Secondary | ICD-10-CM | POA: Diagnosis not present

## 2019-04-13 DIAGNOSIS — N184 Chronic kidney disease, stage 4 (severe): Secondary | ICD-10-CM | POA: Diagnosis not present

## 2019-04-14 ENCOUNTER — Other Ambulatory Visit (HOSPITAL_COMMUNITY): Payer: Self-pay | Admitting: Cardiology

## 2019-04-14 DIAGNOSIS — N184 Chronic kidney disease, stage 4 (severe): Secondary | ICD-10-CM | POA: Diagnosis not present

## 2019-04-15 DIAGNOSIS — N184 Chronic kidney disease, stage 4 (severe): Secondary | ICD-10-CM | POA: Diagnosis not present

## 2019-04-16 DIAGNOSIS — N184 Chronic kidney disease, stage 4 (severe): Secondary | ICD-10-CM | POA: Diagnosis not present

## 2019-04-17 DIAGNOSIS — N184 Chronic kidney disease, stage 4 (severe): Secondary | ICD-10-CM | POA: Diagnosis not present

## 2019-04-18 DIAGNOSIS — N184 Chronic kidney disease, stage 4 (severe): Secondary | ICD-10-CM | POA: Diagnosis not present

## 2019-04-19 DIAGNOSIS — N184 Chronic kidney disease, stage 4 (severe): Secondary | ICD-10-CM | POA: Diagnosis not present

## 2019-04-20 DIAGNOSIS — N184 Chronic kidney disease, stage 4 (severe): Secondary | ICD-10-CM | POA: Diagnosis not present

## 2019-04-21 DIAGNOSIS — N184 Chronic kidney disease, stage 4 (severe): Secondary | ICD-10-CM | POA: Diagnosis not present

## 2019-04-22 DIAGNOSIS — N184 Chronic kidney disease, stage 4 (severe): Secondary | ICD-10-CM | POA: Diagnosis not present

## 2019-04-23 DIAGNOSIS — N184 Chronic kidney disease, stage 4 (severe): Secondary | ICD-10-CM | POA: Diagnosis not present

## 2019-04-24 DIAGNOSIS — N184 Chronic kidney disease, stage 4 (severe): Secondary | ICD-10-CM | POA: Diagnosis not present

## 2019-04-25 DIAGNOSIS — N184 Chronic kidney disease, stage 4 (severe): Secondary | ICD-10-CM | POA: Diagnosis not present

## 2019-04-25 MED FILL — TACROLIMUS 1 MG CAPSULE: 1 | 30 days supply | Qty: 240 | Fill #0

## 2019-04-26 DIAGNOSIS — N184 Chronic kidney disease, stage 4 (severe): Secondary | ICD-10-CM | POA: Diagnosis not present

## 2019-04-27 DIAGNOSIS — N184 Chronic kidney disease, stage 4 (severe): Secondary | ICD-10-CM | POA: Diagnosis not present

## 2019-04-28 DIAGNOSIS — N184 Chronic kidney disease, stage 4 (severe): Secondary | ICD-10-CM | POA: Diagnosis not present

## 2019-04-29 ENCOUNTER — Other Ambulatory Visit: Payer: Self-pay | Admitting: Internal Medicine

## 2019-04-29 DIAGNOSIS — E1121 Type 2 diabetes mellitus with diabetic nephropathy: Secondary | ICD-10-CM

## 2019-04-29 DIAGNOSIS — N184 Chronic kidney disease, stage 4 (severe): Secondary | ICD-10-CM | POA: Diagnosis not present

## 2019-04-29 MED FILL — BD INSULIN SYR 0.5 ML 30GX1: 30G X 1/2" | 25 days supply | Qty: 100 | Fill #0

## 2019-04-29 MED FILL — cloNIDine HCL 0.3 MG TABS: 0.3 | 30 days supply | Qty: 60 | Fill #1

## 2019-04-29 MED FILL — ATORVASTATIN 80 MG TABLET: 80 | 30 days supply | Qty: 30 | Fill #1

## 2019-04-29 MED FILL — CARVEDILOL 25 MG TABLET: 25 | 30 days supply | Qty: 60 | Fill #2

## 2019-04-29 MED FILL — NovoLOG 100 UNIT/ML SOLN: 100 | 22 days supply | Qty: 10 | Fill #1

## 2019-04-29 MED FILL — LANTUS 100 UNITS/ML VIAL: 100 | 83 days supply | Qty: 30 | Fill #0

## 2019-04-30 DIAGNOSIS — N184 Chronic kidney disease, stage 4 (severe): Secondary | ICD-10-CM | POA: Diagnosis not present

## 2019-05-01 DIAGNOSIS — N184 Chronic kidney disease, stage 4 (severe): Secondary | ICD-10-CM | POA: Diagnosis not present

## 2019-05-02 DIAGNOSIS — N184 Chronic kidney disease, stage 4 (severe): Secondary | ICD-10-CM | POA: Diagnosis not present

## 2019-05-03 DIAGNOSIS — N184 Chronic kidney disease, stage 4 (severe): Secondary | ICD-10-CM | POA: Diagnosis not present

## 2019-05-04 DIAGNOSIS — I129 Hypertensive chronic kidney disease with stage 1 through stage 4 chronic kidney disease, or unspecified chronic kidney disease: Secondary | ICD-10-CM | POA: Diagnosis not present

## 2019-05-04 DIAGNOSIS — I5022 Chronic systolic (congestive) heart failure: Secondary | ICD-10-CM | POA: Diagnosis not present

## 2019-05-04 DIAGNOSIS — N184 Chronic kidney disease, stage 4 (severe): Secondary | ICD-10-CM | POA: Diagnosis not present

## 2019-05-04 DIAGNOSIS — E785 Hyperlipidemia, unspecified: Secondary | ICD-10-CM | POA: Diagnosis not present

## 2019-05-04 DIAGNOSIS — N052 Unspecified nephritic syndrome with diffuse membranous glomerulonephritis: Secondary | ICD-10-CM | POA: Diagnosis not present

## 2019-05-04 DIAGNOSIS — E1129 Type 2 diabetes mellitus with other diabetic kidney complication: Secondary | ICD-10-CM | POA: Diagnosis not present

## 2019-05-04 DIAGNOSIS — N2581 Secondary hyperparathyroidism of renal origin: Secondary | ICD-10-CM | POA: Diagnosis not present

## 2019-05-05 DIAGNOSIS — N184 Chronic kidney disease, stage 4 (severe): Secondary | ICD-10-CM | POA: Diagnosis not present

## 2019-05-05 MED FILL — glipiZIDE 5 MG TABS: 5 | 90 days supply | Qty: 90 | Fill #0

## 2019-05-05 MED FILL — FUROSEMIDE 80 MG TABS: 80 | 30 days supply | Qty: 60 | Fill #2

## 2019-05-06 DIAGNOSIS — N184 Chronic kidney disease, stage 4 (severe): Secondary | ICD-10-CM | POA: Diagnosis not present

## 2019-05-06 MED FILL — POTASSIUM CHLORIDE CRYS ER: 20 | 34 days supply | Qty: 34 | Fill #0

## 2019-05-07 DIAGNOSIS — N184 Chronic kidney disease, stage 4 (severe): Secondary | ICD-10-CM | POA: Diagnosis not present

## 2019-05-08 DIAGNOSIS — N184 Chronic kidney disease, stage 4 (severe): Secondary | ICD-10-CM | POA: Diagnosis not present

## 2019-05-09 DIAGNOSIS — N184 Chronic kidney disease, stage 4 (severe): Secondary | ICD-10-CM | POA: Diagnosis not present

## 2019-05-10 DIAGNOSIS — N184 Chronic kidney disease, stage 4 (severe): Secondary | ICD-10-CM | POA: Diagnosis not present

## 2019-05-11 DIAGNOSIS — N184 Chronic kidney disease, stage 4 (severe): Secondary | ICD-10-CM | POA: Diagnosis not present

## 2019-05-12 DIAGNOSIS — N184 Chronic kidney disease, stage 4 (severe): Secondary | ICD-10-CM | POA: Diagnosis not present

## 2019-05-13 ENCOUNTER — Other Ambulatory Visit (HOSPITAL_COMMUNITY): Payer: Self-pay | Admitting: Cardiology

## 2019-05-13 ENCOUNTER — Other Ambulatory Visit (HOSPITAL_COMMUNITY): Payer: Self-pay | Admitting: *Deleted

## 2019-05-13 DIAGNOSIS — N184 Chronic kidney disease, stage 4 (severe): Secondary | ICD-10-CM | POA: Diagnosis not present

## 2019-05-13 DIAGNOSIS — I5042 Chronic combined systolic (congestive) and diastolic (congestive) heart failure: Secondary | ICD-10-CM

## 2019-05-13 MED FILL — hydrALAZINE HCL 100 MG TABS: 100 | 30 days supply | Qty: 90 | Fill #0

## 2019-05-14 DIAGNOSIS — N184 Chronic kidney disease, stage 4 (severe): Secondary | ICD-10-CM | POA: Diagnosis not present

## 2019-05-15 DIAGNOSIS — N184 Chronic kidney disease, stage 4 (severe): Secondary | ICD-10-CM | POA: Diagnosis not present

## 2019-05-16 DIAGNOSIS — N184 Chronic kidney disease, stage 4 (severe): Secondary | ICD-10-CM | POA: Diagnosis not present

## 2019-05-17 DIAGNOSIS — N184 Chronic kidney disease, stage 4 (severe): Secondary | ICD-10-CM | POA: Diagnosis not present

## 2019-05-18 DIAGNOSIS — N184 Chronic kidney disease, stage 4 (severe): Secondary | ICD-10-CM | POA: Diagnosis not present

## 2019-05-19 DIAGNOSIS — N184 Chronic kidney disease, stage 4 (severe): Secondary | ICD-10-CM | POA: Diagnosis not present

## 2019-05-20 DIAGNOSIS — N184 Chronic kidney disease, stage 4 (severe): Secondary | ICD-10-CM | POA: Diagnosis not present

## 2019-05-21 DIAGNOSIS — N184 Chronic kidney disease, stage 4 (severe): Secondary | ICD-10-CM | POA: Diagnosis not present

## 2019-05-22 DIAGNOSIS — N184 Chronic kidney disease, stage 4 (severe): Secondary | ICD-10-CM | POA: Diagnosis not present

## 2019-05-23 DIAGNOSIS — N184 Chronic kidney disease, stage 4 (severe): Secondary | ICD-10-CM | POA: Diagnosis not present

## 2019-05-23 MED FILL — TACROLIMUS 1 MG CAPSULE: 1 | 30 days supply | Qty: 240 | Fill #1

## 2019-05-24 DIAGNOSIS — N184 Chronic kidney disease, stage 4 (severe): Secondary | ICD-10-CM | POA: Diagnosis not present

## 2019-05-25 DIAGNOSIS — N184 Chronic kidney disease, stage 4 (severe): Secondary | ICD-10-CM | POA: Diagnosis not present

## 2019-05-26 DIAGNOSIS — N184 Chronic kidney disease, stage 4 (severe): Secondary | ICD-10-CM | POA: Diagnosis not present

## 2019-05-27 DIAGNOSIS — N184 Chronic kidney disease, stage 4 (severe): Secondary | ICD-10-CM | POA: Diagnosis not present

## 2019-05-28 DIAGNOSIS — N184 Chronic kidney disease, stage 4 (severe): Secondary | ICD-10-CM | POA: Diagnosis not present

## 2019-05-29 DIAGNOSIS — N184 Chronic kidney disease, stage 4 (severe): Secondary | ICD-10-CM | POA: Diagnosis not present

## 2019-05-30 DIAGNOSIS — N184 Chronic kidney disease, stage 4 (severe): Secondary | ICD-10-CM | POA: Diagnosis not present

## 2019-05-31 DIAGNOSIS — N184 Chronic kidney disease, stage 4 (severe): Secondary | ICD-10-CM | POA: Diagnosis not present

## 2019-06-01 ENCOUNTER — Other Ambulatory Visit: Payer: Self-pay | Admitting: Internal Medicine

## 2019-06-01 DIAGNOSIS — N183 Chronic kidney disease, stage 3 (moderate): Secondary | ICD-10-CM | POA: Diagnosis not present

## 2019-06-01 DIAGNOSIS — I5042 Chronic combined systolic (congestive) and diastolic (congestive) heart failure: Secondary | ICD-10-CM

## 2019-06-01 DIAGNOSIS — E785 Hyperlipidemia, unspecified: Secondary | ICD-10-CM

## 2019-06-01 DIAGNOSIS — N184 Chronic kidney disease, stage 4 (severe): Secondary | ICD-10-CM | POA: Diagnosis not present

## 2019-06-01 MED FILL — ATORVASTATIN 80 MG TABLET: 80 | 90 days supply | Qty: 90 | Fill #0

## 2019-06-02 ENCOUNTER — Other Ambulatory Visit (HOSPITAL_COMMUNITY): Payer: Self-pay | Admitting: Internal Medicine

## 2019-06-02 DIAGNOSIS — N184 Chronic kidney disease, stage 4 (severe): Secondary | ICD-10-CM | POA: Diagnosis not present

## 2019-06-02 MED FILL — cloNIDine HCL 0.3 MG TABS: 0.3 | 30 days supply | Qty: 60 | Fill #0

## 2019-06-03 DIAGNOSIS — N184 Chronic kidney disease, stage 4 (severe): Secondary | ICD-10-CM | POA: Diagnosis not present

## 2019-06-04 DIAGNOSIS — N184 Chronic kidney disease, stage 4 (severe): Secondary | ICD-10-CM | POA: Diagnosis not present

## 2019-06-05 DIAGNOSIS — N184 Chronic kidney disease, stage 4 (severe): Secondary | ICD-10-CM | POA: Diagnosis not present

## 2019-06-06 DIAGNOSIS — N184 Chronic kidney disease, stage 4 (severe): Secondary | ICD-10-CM | POA: Diagnosis not present

## 2019-06-07 ENCOUNTER — Other Ambulatory Visit (HOSPITAL_COMMUNITY): Payer: Self-pay | Admitting: Cardiology

## 2019-06-07 DIAGNOSIS — N184 Chronic kidney disease, stage 4 (severe): Secondary | ICD-10-CM | POA: Diagnosis not present

## 2019-06-08 DIAGNOSIS — N184 Chronic kidney disease, stage 4 (severe): Secondary | ICD-10-CM | POA: Diagnosis not present

## 2019-06-08 MED FILL — POTASSIUM CHLORIDE CRYS ER: 20 | 34 days supply | Qty: 34 | Fill #1

## 2019-06-09 DIAGNOSIS — N184 Chronic kidney disease, stage 4 (severe): Secondary | ICD-10-CM | POA: Diagnosis not present

## 2019-06-10 DIAGNOSIS — N184 Chronic kidney disease, stage 4 (severe): Secondary | ICD-10-CM | POA: Diagnosis not present

## 2019-06-11 DIAGNOSIS — N184 Chronic kidney disease, stage 4 (severe): Secondary | ICD-10-CM | POA: Diagnosis not present

## 2019-06-12 DIAGNOSIS — N184 Chronic kidney disease, stage 4 (severe): Secondary | ICD-10-CM | POA: Diagnosis not present

## 2019-06-13 DIAGNOSIS — N184 Chronic kidney disease, stage 4 (severe): Secondary | ICD-10-CM | POA: Diagnosis not present

## 2019-06-14 DIAGNOSIS — N184 Chronic kidney disease, stage 4 (severe): Secondary | ICD-10-CM | POA: Diagnosis not present

## 2019-06-15 DIAGNOSIS — N184 Chronic kidney disease, stage 4 (severe): Secondary | ICD-10-CM | POA: Diagnosis not present

## 2019-06-16 DIAGNOSIS — N184 Chronic kidney disease, stage 4 (severe): Secondary | ICD-10-CM | POA: Diagnosis not present

## 2019-06-17 DIAGNOSIS — N184 Chronic kidney disease, stage 4 (severe): Secondary | ICD-10-CM | POA: Diagnosis not present

## 2019-06-18 DIAGNOSIS — N184 Chronic kidney disease, stage 4 (severe): Secondary | ICD-10-CM | POA: Diagnosis not present

## 2019-06-19 DIAGNOSIS — N184 Chronic kidney disease, stage 4 (severe): Secondary | ICD-10-CM | POA: Diagnosis not present

## 2019-06-20 DIAGNOSIS — N184 Chronic kidney disease, stage 4 (severe): Secondary | ICD-10-CM | POA: Diagnosis not present

## 2019-06-20 MED FILL — TACROLIMUS 1 MG CAPSULE: 1 | 30 days supply | Qty: 240 | Fill #2

## 2019-06-21 DIAGNOSIS — N184 Chronic kidney disease, stage 4 (severe): Secondary | ICD-10-CM | POA: Diagnosis not present

## 2019-06-22 DIAGNOSIS — N184 Chronic kidney disease, stage 4 (severe): Secondary | ICD-10-CM | POA: Diagnosis not present

## 2019-06-23 DIAGNOSIS — N184 Chronic kidney disease, stage 4 (severe): Secondary | ICD-10-CM | POA: Diagnosis not present

## 2019-06-24 DIAGNOSIS — N184 Chronic kidney disease, stage 4 (severe): Secondary | ICD-10-CM | POA: Diagnosis not present

## 2019-06-25 DIAGNOSIS — N184 Chronic kidney disease, stage 4 (severe): Secondary | ICD-10-CM | POA: Diagnosis not present

## 2019-06-26 DIAGNOSIS — N184 Chronic kidney disease, stage 4 (severe): Secondary | ICD-10-CM | POA: Diagnosis not present

## 2019-06-27 DIAGNOSIS — N184 Chronic kidney disease, stage 4 (severe): Secondary | ICD-10-CM | POA: Diagnosis not present

## 2019-06-28 ENCOUNTER — Other Ambulatory Visit (HOSPITAL_COMMUNITY): Payer: Self-pay | Admitting: Cardiology

## 2019-06-28 ENCOUNTER — Ambulatory Visit (HOSPITAL_COMMUNITY)
Admission: RE | Admit: 2019-06-28 | Discharge: 2019-06-28 | Disposition: A | Discharge status: Home / Self Care | Payer: Medicaid Other | Source: Ambulatory Visit | Attending: Cardiology | Admitting: Cardiology

## 2019-06-28 ENCOUNTER — Other Ambulatory Visit: Payer: Self-pay

## 2019-06-28 ENCOUNTER — Encounter (HOSPITAL_COMMUNITY): Payer: Self-pay | Admitting: Cardiology

## 2019-06-28 ENCOUNTER — Ambulatory Visit (HOSPITAL_BASED_OUTPATIENT_CLINIC_OR_DEPARTMENT_OTHER)
Admission: RE | Admit: 2019-06-28 | Discharge: 2019-06-28 | Disposition: A | Discharge status: Home / Self Care | Payer: Medicaid Other | Source: Ambulatory Visit

## 2019-06-28 VITALS — BP 116/88 | HR 66 | Wt 279.2 lb

## 2019-06-28 DIAGNOSIS — N049 Nephrotic syndrome with unspecified morphologic changes: Secondary | ICD-10-CM | POA: Diagnosis not present

## 2019-06-28 DIAGNOSIS — J45909 Unspecified asthma, uncomplicated: Secondary | ICD-10-CM | POA: Diagnosis not present

## 2019-06-28 DIAGNOSIS — N189 Chronic kidney disease, unspecified: Secondary | ICD-10-CM | POA: Diagnosis not present

## 2019-06-28 DIAGNOSIS — I10 Essential (primary) hypertension: Secondary | ICD-10-CM | POA: Diagnosis not present

## 2019-06-28 DIAGNOSIS — E1122 Type 2 diabetes mellitus with diabetic chronic kidney disease: Secondary | ICD-10-CM | POA: Insufficient documentation

## 2019-06-28 DIAGNOSIS — Z8249 Family history of ischemic heart disease and other diseases of the circulatory system: Secondary | ICD-10-CM | POA: Diagnosis not present

## 2019-06-28 DIAGNOSIS — I5042 Chronic combined systolic (congestive) and diastolic (congestive) heart failure: Secondary | ICD-10-CM

## 2019-06-28 DIAGNOSIS — I130 Hypertensive heart and chronic kidney disease with heart failure and stage 1 through stage 4 chronic kidney disease, or unspecified chronic kidney disease: Secondary | ICD-10-CM | POA: Insufficient documentation

## 2019-06-28 DIAGNOSIS — I5032 Chronic diastolic (congestive) heart failure: Secondary | ICD-10-CM | POA: Diagnosis not present

## 2019-06-28 DIAGNOSIS — Z794 Long term (current) use of insulin: Secondary | ICD-10-CM | POA: Insufficient documentation

## 2019-06-28 DIAGNOSIS — Z79899 Other long term (current) drug therapy: Secondary | ICD-10-CM | POA: Diagnosis not present

## 2019-06-28 DIAGNOSIS — E785 Hyperlipidemia, unspecified: Secondary | ICD-10-CM | POA: Diagnosis not present

## 2019-06-28 DIAGNOSIS — I428 Other cardiomyopathies: Secondary | ICD-10-CM | POA: Diagnosis not present

## 2019-06-28 DIAGNOSIS — E1121 Type 2 diabetes mellitus with diabetic nephropathy: Secondary | ICD-10-CM | POA: Insufficient documentation

## 2019-06-28 DIAGNOSIS — N184 Chronic kidney disease, stage 4 (severe): Secondary | ICD-10-CM | POA: Diagnosis not present

## 2019-06-28 LAB — LIPID PANEL
Cholesterol: 135 mg/dL (ref 0–200)
HDL: 31 mg/dL — ABNORMAL LOW (ref >40)
LDL Cholesterol: 64 mg/dL (ref 0–99)
Total CHOL/HDL Ratio: 4.4 RATIO
Triglycerides: 202 mg/dL — ABNORMAL HIGH (ref <150)
VLDL: 40 mg/dL (ref 0–40)

## 2019-06-28 LAB — BASIC METABOLIC PANEL
Anion gap: 12 (ref 5–15)
BUN: 48 mg/dL — ABNORMAL HIGH (ref 6–20)
CO2: 23 mmol/L (ref 22–32)
Calcium: 8.9 mg/dL (ref 8.9–10.3)
Chloride: 96 mmol/L — ABNORMAL LOW (ref 98–111)
Creatinine, Ser: 2.55 mg/dL — ABNORMAL HIGH (ref 0.61–1.24)
GFR calc Af Amer: 35 mL/min — ABNORMAL LOW (ref >60)
GFR calc non Af Amer: 30 mL/min — ABNORMAL LOW (ref >60)
Glucose, Bld: 679 mg/dL (ref 70–99)
Potassium: 4.9 mmol/L (ref 3.5–5.1)
Sodium: 131 mmol/L — ABNORMAL LOW (ref 135–145)

## 2019-06-28 MED ORDER — FUROSEMIDE 80 MG PO TABS: 80.0000 mg | ORAL_TABLET | Freq: Every day | ORAL | 6 refills | Status: DC

## 2019-06-28 MED ORDER — CLONIDINE HCL 0.3 MG PO TABS: 0.3000 mg | ORAL_TABLET | Freq: Two times a day (BID) | ORAL | 6 refills | Status: DC

## 2019-06-28 MED ORDER — NITROGLYCERIN 0.4 MG SL SUBL: SUBLINGUAL_TABLET | SUBLINGUAL | 3 refills | Status: DC

## 2019-06-28 MED FILL — cloNIDine HCL 0.3 MG TABS: 0.3 | 30 days supply | Qty: 60 | Fill #1

## 2019-06-28 MED FILL — NITROGLYCERIN 0.4 MG TAB SL: 0.4 | 30 days supply | Qty: 25 | Fill #0

## 2019-06-28 MED FILL — hydrALAZINE HCL 100 MG TABS: 100 | 30 days supply | Qty: 90 | Fill #0

## 2019-06-28 NOTE — Patient Instructions (Signed)
DECREASE Lasix to 80 mg, one tab daily -Be sure to give our office a call if you notice increased shortness of breath or swelling  Labs today We will only contact you if something comes back abnormal or we need to make some changes. Otherwise no news is good news!  Your physician recommends that you schedule a follow-up appointment in: 6 months with Dr Aundra Dubin -we will call you closer to that time for an appointment however if we have not reached out to you by March 2021 please give our office a call.  Do the following things EVERYDAY: 1) Weigh yourself in the morning before breakfast. Write it down and keep it in a log. 2) Take your medicines as prescribed 3) Eat low salt foods-Limit salt (sodium) to 2000 mg per day.  4) Stay as active as you can everyday 5) Limit all fluids for the day to less than 2 liters  At the [facility], you and your health needs are our priority. As part of our continuing mission to provide you with exceptional heart care, we have created designated Provider Care Teams. These Care Teams include your primary Cardiologist (physician) and Advanced Practice Providers (APPs- Physician Assistants and Nurse Practitioners) who all work together to provide you with the care you need, when you need it.   You may see any of the following providers on your designated Care Team at your next follow up: [name] Dr Glori Bickers . Dr Loralie Champagne . Darrick Grinder, NP . Lyda Jester, PA   Please be sure to bring in all your medications bottles to every appointment.

## 2019-06-28 NOTE — Progress Notes (Signed)
  Echocardiogram 2D Echocardiogram has been performed.  Robson Trickey L Androw 06/28/2019, 1:49 PM

## 2019-06-29 DIAGNOSIS — N184 Chronic kidney disease, stage 4 (severe): Secondary | ICD-10-CM | POA: Diagnosis not present

## 2019-06-29 NOTE — Progress Notes (Signed)
Patient ID: Jeremy [name], male   DOB: [birth year], 38 y.o.   MRN: [id number]    Advanced Heart Failure Clinic Note   PCP: Dr. Randell Patient Primary cardiologist: Dr. Aundra Dubin  38 y.o. with history of nonischemic cardiomyopathy, HTN, and DM presents for cardiology followup.  In 4/16, patient was seen in the ER with edema and chest pain.  The only medication he was taking was metformin.  BP was elevated.  Echo showed EF 20-25%, and Cardiolite suggested ischemia.  He had RHC/LHC in 5/16.  This showed no coronary disease and mildly elevated filling pressures.    Admitted 9/21 through 06/03/16 with marked volume overload and hypertensive crisis. Diuresed with IV lasix and transitioned to lasix 160 mg twice daily. Due to worsening renal function, nephrology consulted. Work up consistent with nephrotic syndrome. 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  Discharge creatinine 3.42.  He has been following with renal. He has been getting iron infusions and feels like he has more energy.  EF improved to 55% on 9/17 echo.    Echo in 5/19 showed EF 50-55%, moderate LVH, normal RV.   Echo was done today and reviewed, EF up to 55-60%, normal RV.   He presents today for followup of CHF and HTN.  BP is well-controlled.  He has gained about 6 lbs.  No dyspnea walking on flat ground.  Rare atypical chest pain (fleeting generally).  No lightheadedness.     ECG (personally reviewed): NSR, LAFB, poor RWP  Labs (6/16): LDL 308, HDL 46, TGs 284 Labs (7/16): K 3.5, creatinine 1.0 Labs 05/24/2015: K 3.9 Creatinine 1.43  Labs (3/17): creatinine 1.55, LDL 108, HDL 31 Labs (9/17): hemoglobin 8.3, K 4, BUN 40, creatinine 3.69, BNP 56 Labs (06/03/2016) : K 4.6 Creatinine 3.42  Labs (10/17): K 4.4, creatinine 3.44, hgb 9.4 Labs (1/18): K 4.9, creatinine 1.85 Labs (4/19): K 4.6, creatinine 2.14 Labs (8/20): K 4.4, creatinine 2.81  PMH: 1. Type II diabetes since 2006.  2. HTN since  around 2010.   3. Asthma 4. Hyperlipidemia 5. Morbid obesity 6. Cardiomyopathy: Nonischemic.  Echo (5/16) with EF 20-25%, diffuse hypokinesis, grade II diastolic dysfunction.  Lexiscan Cardiolite (5/16) with EF 28%, inferior/inferolateral/apical ischemia.  RHC/LHC (5/16) with normal coronaries, mean RA 11, PA 33/21 mean 28, mean PCWP 18, CI 2.53.  - Echo (4/17): EF 50%, moderate LVH, normal RV size and systolic function. Grade IDD  - Echo (9/17): EF 55%.  - Echo (5/19): EF 50-55%, moderate LVH, normal RV size and systolic function.  - Echo (10/20): EF 55-60%, normal RV.  7. ABIs normal 8/16.  8. CKD: AKI in 9/17 with diagnosis of nephrotic syndrome. 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   SH: Lives with girlfriend, nonsmoker, no cocaine, occasional marijuana, no ETOH.  Works as Art gallery manager.  FH: Father with CHF, died at 44.  Sister with CHF, PAD.   Review of systems complete and found to be negative unless listed in HPI.    Current Outpatient Medications  Medication Sig Dispense Refill  . ACCU-CHEK FASTCLIX LANCETS MISC Check your blood sugars 4 times a day. 102 each 12  . amLODipine (NORVASC) 10 MG tablet Take 1 tablet (10 mg total) by mouth at bedtime. 30 tablet 6  . ASPIR-LOW 81 MG EC tablet TAKE 1 TABLET BY MOUTH DAILY. 30 tablet 11  . atorvastatin (LIPITOR) 80 MG tablet TAKE 1 TABLET BY MOUTH DAILY. 90 tablet 1  . blood glucose meter kit  and supplies KIT Dispense based on patient and insurance preference. Use up to four times daily as directed. (FOR ICD-9 250.00, 250.01). 1 each 0  . carvedilol (COREG) 25 MG tablet TAKE 1 TABLET (25 MG TOTAL) BY MOUTH 2 TIMES DAILY WITH A MEAL. 60 tablet 5  . cloNIDine (CATAPRES) 0.3 MG tablet Take 1 tablet (0.3 mg total) by mouth 2 (two) times daily. 60 tablet 6  . famotidine (PEPCID) 10 MG tablet Take 1 tablet (10 mg total) by mouth daily as needed for heartburn or indigestion. 90 tablet 3  . furosemide (LASIX)  80 MG tablet Take 1 tablet (80 mg total) by mouth daily. 30 tablet 6  . glipiZIDE (GLUCOTROL) 5 MG tablet Take 1 tablet (5 mg total) by mouth daily. 90 tablet 3  . glucose blood (ACCU-CHEK GUIDE) test strip Use to check blood sugar 4 times daily. diag code E11.21. Insulin dependent 375 each 10  . insulin glargine (LANTUS) 100 UNIT/ML injection INJECT 36 UNITS INTO THE SKIN AT BEDTIME. 30 mL 1  . Insulin Pen Needle (UNIFINE PENTIPS) 32G X 4 MM MISC 1 applicator by Does not apply route QID. 100 each 12  . Insulin Syringe-Needle U-100 (ULTICARE INSULIN SYRINGE) 30G X 1/2" 0.5 ML MISC USE 4 TIMES DAILY WITH INSULIN INJECTIONS. DIAG CODE E11.21. INSULIN DEPENDENT 360 each 1  . NOVOLOG 100 UNIT/ML injection INJECT 15 UNITS 3 TIMES A DAY WITH MEALS INTO THE SKIN. 40 mL 3  . potassium chloride SA (K-DUR) 20 MEQ tablet TAKE 1 TABLET BY MOUTH DAILY. 90 tablet 3  . tacrolimus (PROGRAF) 1 MG capsule Take 4 mg by mouth 2 (two) times daily.   5  . hydrALAZINE (APRESOLINE) 100 MG tablet TAKE 1 TABLET BY MOUTH 3 TIMES DAILY 90 tablet 6  . nitroGLYCERIN (NITROSTAT) 0.4 MG SL tablet PLACE 1 TABLET UNDER THE TONGUE EVERY 5 MINUTES AS NEEDED FOR CHEST PAIN. 25 tablet 3   No current facility-administered medications for this encounter.    Vitals:   06/28/19 1340  BP: 116/88  Pulse: 66  SpO2: 98%  Weight: 126.6 kg (279 lb 3.2 oz)   Wt Readings from Last 3 Encounters:  06/28/19 126.6 kg (279 lb 3.2 oz)  12/06/18 124.9 kg (275 lb 6.4 oz)  10/15/18 124.2 kg (273 lb 12.8 oz)   Physical Exam General: NAD Neck: No JVD, no thyromegaly or thyroid nodule.  Lungs: Clear to auscultation bilaterally with normal respiratory effort. CV: Nondisplaced PMI.  Heart regular S1/S2, no S3/S4, no murmur.  No peripheral edema.  No carotid bruit.  Normal pedal pulses.  Abdomen: Soft, nontender, no hepatosplenomegaly, no distention.  Skin: Intact without lesions or rashes.  Neurologic: Alert and oriented x 3.  Psych: Normal  affect. Extremities: No clubbing or cyanosis.  HEENT: Normal.   Assessment/Plan:  1. Chronic diastolic CHF: Nonischemic cardiomyopathy, ?from long-standing HTN.  EF was 39-25% in 2006 but EF has improved.  Echo today was reviewed, EF 55-60% with normal RV systolic function. NYHA class II.  He is not volume overloaded on exam.  I think the key to maintaining his cardiac function is going to be to keep his BP controlled.   - I will have him decrease Lasix to 80 mg daily.  He can increase back up if he starts retaining fluid. - Continue Coreg 25 mg bid.  2.  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   - Continue atorvastatin, check lipids today.   3. HTN: Imperative to control for both CHF and renal  dysfunction.  Currently seems reasonably controlled.  - Continue current regimen.  4. 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 Follows with renal.  - BMET today.  - Getting tacrolimus.    Loralie Champagne, MD  06/29/2019

## 2019-06-30 DIAGNOSIS — N184 Chronic kidney disease, stage 4 (severe): Secondary | ICD-10-CM | POA: Diagnosis not present

## 2019-07-01 DIAGNOSIS — N184 Chronic kidney disease, stage 4 (severe): Secondary | ICD-10-CM | POA: Diagnosis not present

## 2019-07-02 DIAGNOSIS — N184 Chronic kidney disease, stage 4 (severe): Secondary | ICD-10-CM | POA: Diagnosis not present

## 2019-07-03 DIAGNOSIS — N184 Chronic kidney disease, stage 4 (severe): Secondary | ICD-10-CM | POA: Diagnosis not present

## 2019-07-04 DIAGNOSIS — N184 Chronic kidney disease, stage 4 (severe): Secondary | ICD-10-CM | POA: Diagnosis not present

## 2019-07-05 DIAGNOSIS — N184 Chronic kidney disease, stage 4 (severe): Secondary | ICD-10-CM | POA: Diagnosis not present

## 2019-07-06 DIAGNOSIS — N184 Chronic kidney disease, stage 4 (severe): Secondary | ICD-10-CM | POA: Diagnosis not present

## 2019-07-06 MED FILL — AMLODIPINE BESYLATE 10 MG T: 10 | 30 days supply | Qty: 30 | Fill #1

## 2019-07-06 MED FILL — NovoLOG 100 UNIT/ML SOLN: 100 | 22 days supply | Qty: 10 | Fill #3

## 2019-07-06 MED FILL — CARVEDILOL 25 MG TABLET: 25 | 30 days supply | Qty: 60 | Fill #4

## 2019-07-07 DIAGNOSIS — N184 Chronic kidney disease, stage 4 (severe): Secondary | ICD-10-CM | POA: Diagnosis not present

## 2019-07-08 DIAGNOSIS — N184 Chronic kidney disease, stage 4 (severe): Secondary | ICD-10-CM | POA: Diagnosis not present

## 2019-07-09 DIAGNOSIS — N184 Chronic kidney disease, stage 4 (severe): Secondary | ICD-10-CM | POA: Diagnosis not present

## 2019-07-10 DIAGNOSIS — N184 Chronic kidney disease, stage 4 (severe): Secondary | ICD-10-CM | POA: Diagnosis not present

## 2019-07-11 DIAGNOSIS — N184 Chronic kidney disease, stage 4 (severe): Secondary | ICD-10-CM | POA: Diagnosis not present

## 2019-07-12 DIAGNOSIS — N184 Chronic kidney disease, stage 4 (severe): Secondary | ICD-10-CM | POA: Diagnosis not present

## 2019-07-13 DIAGNOSIS — N184 Chronic kidney disease, stage 4 (severe): Secondary | ICD-10-CM | POA: Diagnosis not present

## 2019-07-14 DIAGNOSIS — N184 Chronic kidney disease, stage 4 (severe): Secondary | ICD-10-CM | POA: Diagnosis not present

## 2019-07-15 DIAGNOSIS — N184 Chronic kidney disease, stage 4 (severe): Secondary | ICD-10-CM | POA: Diagnosis not present

## 2019-07-16 DIAGNOSIS — N184 Chronic kidney disease, stage 4 (severe): Secondary | ICD-10-CM | POA: Diagnosis not present

## 2019-07-17 DIAGNOSIS — N184 Chronic kidney disease, stage 4 (severe): Secondary | ICD-10-CM | POA: Diagnosis not present

## 2019-07-18 DIAGNOSIS — N184 Chronic kidney disease, stage 4 (severe): Secondary | ICD-10-CM | POA: Diagnosis not present

## 2019-07-19 DIAGNOSIS — N184 Chronic kidney disease, stage 4 (severe): Secondary | ICD-10-CM | POA: Diagnosis not present

## 2019-07-19 MED FILL — FUROSEMIDE 80 MG TAB: 80 | 30 days supply | Qty: 60 | Fill #1

## 2019-07-19 MED FILL — POTASSIUM CHLORIDE CRYS ER: 20 | 34 days supply | Qty: 34 | Fill #2

## 2019-07-20 DIAGNOSIS — N184 Chronic kidney disease, stage 4 (severe): Secondary | ICD-10-CM | POA: Diagnosis not present

## 2019-07-21 DIAGNOSIS — N184 Chronic kidney disease, stage 4 (severe): Secondary | ICD-10-CM | POA: Diagnosis not present

## 2019-07-21 MED FILL — TACROLIMUS 1 MG CAPSULE: 1 | 30 days supply | Qty: 240 | Fill #3

## 2019-07-22 DIAGNOSIS — N184 Chronic kidney disease, stage 4 (severe): Secondary | ICD-10-CM | POA: Diagnosis not present

## 2019-07-23 DIAGNOSIS — N184 Chronic kidney disease, stage 4 (severe): Secondary | ICD-10-CM | POA: Diagnosis not present

## 2019-07-24 DIAGNOSIS — N184 Chronic kidney disease, stage 4 (severe): Secondary | ICD-10-CM | POA: Diagnosis not present

## 2019-07-25 DIAGNOSIS — N184 Chronic kidney disease, stage 4 (severe): Secondary | ICD-10-CM | POA: Diagnosis not present

## 2019-07-26 DIAGNOSIS — N184 Chronic kidney disease, stage 4 (severe): Secondary | ICD-10-CM | POA: Diagnosis not present

## 2019-07-27 DIAGNOSIS — N184 Chronic kidney disease, stage 4 (severe): Secondary | ICD-10-CM | POA: Diagnosis not present

## 2019-07-28 DIAGNOSIS — N184 Chronic kidney disease, stage 4 (severe): Secondary | ICD-10-CM | POA: Diagnosis not present

## 2019-07-29 DIAGNOSIS — N184 Chronic kidney disease, stage 4 (severe): Secondary | ICD-10-CM | POA: Diagnosis not present

## 2019-07-30 DIAGNOSIS — N184 Chronic kidney disease, stage 4 (severe): Secondary | ICD-10-CM | POA: Diagnosis not present

## 2019-07-31 DIAGNOSIS — N184 Chronic kidney disease, stage 4 (severe): Secondary | ICD-10-CM | POA: Diagnosis not present

## 2019-08-01 ENCOUNTER — Other Ambulatory Visit (HOSPITAL_COMMUNITY): Payer: Self-pay | Admitting: Cardiology

## 2019-08-01 ENCOUNTER — Other Ambulatory Visit: Payer: Self-pay | Admitting: Internal Medicine

## 2019-08-01 DIAGNOSIS — N184 Chronic kidney disease, stage 4 (severe): Secondary | ICD-10-CM | POA: Diagnosis not present

## 2019-08-01 DIAGNOSIS — E1121 Type 2 diabetes mellitus with diabetic nephropathy: Secondary | ICD-10-CM

## 2019-08-01 DIAGNOSIS — Z794 Long term (current) use of insulin: Secondary | ICD-10-CM

## 2019-08-01 MED ORDER — CLONIDINE HCL 0.3 MG PO TABS: 0.3000 mg | ORAL_TABLET | Freq: Two times a day (BID) | ORAL | 6 refills | Status: DC

## 2019-08-01 MED FILL — cloNIDine HCL 0.3 MG TABS: 0.3 | 30 days supply | Qty: 60 | Fill #0

## 2019-08-01 MED FILL — ACCU-CHEK FASTCLIX LANCETS: 25 days supply | Qty: 102 | Fill #0

## 2019-08-01 MED FILL — ACCU-CHEK GUIDE TEST STRIP: 25 days supply | Qty: 100 | Fill #0

## 2019-08-01 MED FILL — CARVEDILOL 25 MG TABLET: 25 | 30 days supply | Qty: 60 | Fill #5

## 2019-08-01 MED FILL — LANTUS 100 UNITS/ML VIAL: 100 | 83 days supply | Qty: 30 | Fill #1

## 2019-08-01 MED FILL — ULTICARE INS 0.5 ML 30GX1/2: 30G X 1/2" | 25 days supply | Qty: 100 | Fill #1

## 2019-08-01 MED FILL — glipiZIDE 5 MG TABS: 5 | 90 days supply | Qty: 90 | Fill #1

## 2019-08-01 MED FILL — NovoLOG 100 UNIT/ML SOLN: 100 | 22 days supply | Qty: 10 | Fill #0

## 2019-08-02 DIAGNOSIS — N184 Chronic kidney disease, stage 4 (severe): Secondary | ICD-10-CM | POA: Diagnosis not present

## 2019-08-03 DIAGNOSIS — N184 Chronic kidney disease, stage 4 (severe): Secondary | ICD-10-CM | POA: Diagnosis not present

## 2019-08-04 DIAGNOSIS — N184 Chronic kidney disease, stage 4 (severe): Secondary | ICD-10-CM | POA: Diagnosis not present

## 2019-08-05 DIAGNOSIS — N184 Chronic kidney disease, stage 4 (severe): Secondary | ICD-10-CM | POA: Diagnosis not present

## 2019-08-06 DIAGNOSIS — N184 Chronic kidney disease, stage 4 (severe): Secondary | ICD-10-CM | POA: Diagnosis not present

## 2019-08-07 DIAGNOSIS — N184 Chronic kidney disease, stage 4 (severe): Secondary | ICD-10-CM | POA: Diagnosis not present

## 2019-08-08 DIAGNOSIS — I129 Hypertensive chronic kidney disease with stage 1 through stage 4 chronic kidney disease, or unspecified chronic kidney disease: Secondary | ICD-10-CM | POA: Diagnosis not present

## 2019-08-08 DIAGNOSIS — E1129 Type 2 diabetes mellitus with other diabetic kidney complication: Secondary | ICD-10-CM | POA: Diagnosis not present

## 2019-08-08 DIAGNOSIS — N052 Unspecified nephritic syndrome with diffuse membranous glomerulonephritis: Secondary | ICD-10-CM | POA: Diagnosis not present

## 2019-08-08 DIAGNOSIS — N2581 Secondary hyperparathyroidism of renal origin: Secondary | ICD-10-CM | POA: Diagnosis not present

## 2019-08-08 DIAGNOSIS — E785 Hyperlipidemia, unspecified: Secondary | ICD-10-CM | POA: Diagnosis not present

## 2019-08-08 DIAGNOSIS — I5022 Chronic systolic (congestive) heart failure: Secondary | ICD-10-CM | POA: Diagnosis not present

## 2019-08-08 DIAGNOSIS — N184 Chronic kidney disease, stage 4 (severe): Secondary | ICD-10-CM | POA: Diagnosis not present

## 2019-08-08 MED FILL — AMOX-CLAV 500-125 MG TABLET: 500-125 | 5 days supply | Qty: 10 | Fill #0

## 2019-08-09 DIAGNOSIS — N184 Chronic kidney disease, stage 4 (severe): Secondary | ICD-10-CM | POA: Diagnosis not present

## 2019-08-10 DIAGNOSIS — N184 Chronic kidney disease, stage 4 (severe): Secondary | ICD-10-CM | POA: Diagnosis not present

## 2019-08-10 MED FILL — VIT D2 1.25 MG (50,000 UNIT: 1.25 MG | 32 days supply | Qty: 9 | Fill #0

## 2019-08-11 DIAGNOSIS — N184 Chronic kidney disease, stage 4 (severe): Secondary | ICD-10-CM | POA: Diagnosis not present

## 2019-08-12 DIAGNOSIS — N184 Chronic kidney disease, stage 4 (severe): Secondary | ICD-10-CM | POA: Diagnosis not present

## 2019-08-13 DIAGNOSIS — N184 Chronic kidney disease, stage 4 (severe): Secondary | ICD-10-CM | POA: Diagnosis not present

## 2019-08-14 DIAGNOSIS — N184 Chronic kidney disease, stage 4 (severe): Secondary | ICD-10-CM | POA: Diagnosis not present

## 2019-08-15 DIAGNOSIS — N184 Chronic kidney disease, stage 4 (severe): Secondary | ICD-10-CM | POA: Diagnosis not present

## 2019-08-16 DIAGNOSIS — N184 Chronic kidney disease, stage 4 (severe): Secondary | ICD-10-CM | POA: Diagnosis not present

## 2019-08-16 MED FILL — TACROLIMUS 1 MG CAPSULE: 1 | 30 days supply | Qty: 240 | Fill #4

## 2019-08-16 MED FILL — hydrALAZINE HCL 100 MG TABS: 100 | 30 days supply | Qty: 90 | Fill #1

## 2019-08-17 DIAGNOSIS — N184 Chronic kidney disease, stage 4 (severe): Secondary | ICD-10-CM | POA: Diagnosis not present

## 2019-08-18 DIAGNOSIS — N184 Chronic kidney disease, stage 4 (severe): Secondary | ICD-10-CM | POA: Diagnosis not present

## 2019-08-19 DIAGNOSIS — N184 Chronic kidney disease, stage 4 (severe): Secondary | ICD-10-CM | POA: Diagnosis not present

## 2019-08-20 DIAGNOSIS — N184 Chronic kidney disease, stage 4 (severe): Secondary | ICD-10-CM | POA: Diagnosis not present

## 2019-08-21 DIAGNOSIS — N184 Chronic kidney disease, stage 4 (severe): Secondary | ICD-10-CM | POA: Diagnosis not present

## 2019-08-22 DIAGNOSIS — N184 Chronic kidney disease, stage 4 (severe): Secondary | ICD-10-CM | POA: Diagnosis not present

## 2019-08-22 MED FILL — POTASSIUM CHLORIDE CRYS ER: 20 | 34 days supply | Qty: 34 | Fill #3

## 2019-08-23 DIAGNOSIS — N184 Chronic kidney disease, stage 4 (severe): Secondary | ICD-10-CM | POA: Diagnosis not present

## 2019-08-24 DIAGNOSIS — N184 Chronic kidney disease, stage 4 (severe): Secondary | ICD-10-CM | POA: Diagnosis not present

## 2019-08-25 DIAGNOSIS — N184 Chronic kidney disease, stage 4 (severe): Secondary | ICD-10-CM | POA: Diagnosis not present

## 2019-08-26 ENCOUNTER — Other Ambulatory Visit (HOSPITAL_COMMUNITY): Payer: Self-pay | Admitting: Cardiology

## 2019-08-26 DIAGNOSIS — N184 Chronic kidney disease, stage 4 (severe): Secondary | ICD-10-CM | POA: Diagnosis not present

## 2019-08-26 MED FILL — FUROSEMIDE 80 MG TAB: 80 | 30 days supply | Qty: 60 | Fill #2

## 2019-08-26 MED FILL — ATORVASTATIN 80 MG TABLET: 80 | 90 days supply | Qty: 90 | Fill #1

## 2019-08-26 MED FILL — CARVEDILOL 25 MG TABLET: 25 | 30 days supply | Qty: 60 | Fill #0

## 2019-08-27 DIAGNOSIS — N184 Chronic kidney disease, stage 4 (severe): Secondary | ICD-10-CM | POA: Diagnosis not present

## 2019-08-28 DIAGNOSIS — N184 Chronic kidney disease, stage 4 (severe): Secondary | ICD-10-CM | POA: Diagnosis not present

## 2019-08-29 DIAGNOSIS — N184 Chronic kidney disease, stage 4 (severe): Secondary | ICD-10-CM | POA: Diagnosis not present

## 2019-08-29 MED FILL — cloNIDine HCL 0.3 MG TABS: 0.3 | 30 days supply | Qty: 60 | Fill #0

## 2019-08-30 DIAGNOSIS — N184 Chronic kidney disease, stage 4 (severe): Secondary | ICD-10-CM | POA: Diagnosis not present

## 2019-08-31 DIAGNOSIS — N184 Chronic kidney disease, stage 4 (severe): Secondary | ICD-10-CM | POA: Diagnosis not present

## 2019-09-01 DIAGNOSIS — N184 Chronic kidney disease, stage 4 (severe): Secondary | ICD-10-CM | POA: Diagnosis not present

## 2019-09-02 DIAGNOSIS — N184 Chronic kidney disease, stage 4 (severe): Secondary | ICD-10-CM | POA: Diagnosis not present

## 2019-09-03 DIAGNOSIS — N184 Chronic kidney disease, stage 4 (severe): Secondary | ICD-10-CM | POA: Diagnosis not present

## 2019-09-04 DIAGNOSIS — N184 Chronic kidney disease, stage 4 (severe): Secondary | ICD-10-CM | POA: Diagnosis not present

## 2019-09-05 DIAGNOSIS — N184 Chronic kidney disease, stage 4 (severe): Secondary | ICD-10-CM | POA: Diagnosis not present

## 2019-09-06 DIAGNOSIS — N184 Chronic kidney disease, stage 4 (severe): Secondary | ICD-10-CM | POA: Diagnosis not present

## 2019-09-07 DIAGNOSIS — N184 Chronic kidney disease, stage 4 (severe): Secondary | ICD-10-CM | POA: Diagnosis not present

## 2019-09-07 MED FILL — VIT D2 1.25 MG (50,000 UNIT: 1.25 MG | 32 days supply | Qty: 9 | Fill #1

## 2019-09-07 MED FILL — ULTICARE INS 0.5 ML 30GX1/2: 30G X 1/2" | 25 days supply | Qty: 100 | Fill #2

## 2019-09-07 MED FILL — NovoLOG 100 UNIT/ML SOLN: 100 | 22 days supply | Qty: 10 | Fill #1

## 2019-09-08 DIAGNOSIS — N184 Chronic kidney disease, stage 4 (severe): Secondary | ICD-10-CM | POA: Diagnosis not present

## 2019-09-09 DIAGNOSIS — N184 Chronic kidney disease, stage 4 (severe): Secondary | ICD-10-CM | POA: Diagnosis not present

## 2019-09-10 DIAGNOSIS — N184 Chronic kidney disease, stage 4 (severe): Secondary | ICD-10-CM | POA: Diagnosis not present

## 2019-09-10 MED FILL — TACROLIMUS 1 MG CAPSULE: 1 | 30 days supply | Qty: 240 | Fill #0

## 2019-09-11 DIAGNOSIS — N184 Chronic kidney disease, stage 4 (severe): Secondary | ICD-10-CM | POA: Diagnosis not present

## 2019-09-12 DIAGNOSIS — N184 Chronic kidney disease, stage 4 (severe): Secondary | ICD-10-CM | POA: Diagnosis not present

## 2019-09-13 DIAGNOSIS — N184 Chronic kidney disease, stage 4 (severe): Secondary | ICD-10-CM | POA: Diagnosis not present

## 2019-09-14 DIAGNOSIS — N184 Chronic kidney disease, stage 4 (severe): Secondary | ICD-10-CM | POA: Diagnosis not present

## 2019-09-15 DIAGNOSIS — N184 Chronic kidney disease, stage 4 (severe): Secondary | ICD-10-CM | POA: Diagnosis not present

## 2019-09-16 DIAGNOSIS — N184 Chronic kidney disease, stage 4 (severe): Secondary | ICD-10-CM | POA: Diagnosis not present

## 2019-09-17 DIAGNOSIS — N184 Chronic kidney disease, stage 4 (severe): Secondary | ICD-10-CM | POA: Diagnosis not present

## 2019-09-18 DIAGNOSIS — N184 Chronic kidney disease, stage 4 (severe): Secondary | ICD-10-CM | POA: Diagnosis not present

## 2019-09-19 DIAGNOSIS — N184 Chronic kidney disease, stage 4 (severe): Secondary | ICD-10-CM | POA: Diagnosis not present

## 2019-09-20 DIAGNOSIS — N184 Chronic kidney disease, stage 4 (severe): Secondary | ICD-10-CM | POA: Diagnosis not present

## 2019-09-21 DIAGNOSIS — N184 Chronic kidney disease, stage 4 (severe): Secondary | ICD-10-CM | POA: Diagnosis not present

## 2019-09-22 DIAGNOSIS — N184 Chronic kidney disease, stage 4 (severe): Secondary | ICD-10-CM | POA: Diagnosis not present

## 2019-09-23 DIAGNOSIS — N184 Chronic kidney disease, stage 4 (severe): Secondary | ICD-10-CM | POA: Diagnosis not present

## 2019-09-24 DIAGNOSIS — N184 Chronic kidney disease, stage 4 (severe): Secondary | ICD-10-CM | POA: Diagnosis not present

## 2019-09-24 MED FILL — POTASSIUM CHLORIDE CRYS ER: 20 | 34 days supply | Qty: 34 | Fill #0

## 2019-09-25 DIAGNOSIS — N184 Chronic kidney disease, stage 4 (severe): Secondary | ICD-10-CM | POA: Diagnosis not present

## 2019-09-26 DIAGNOSIS — N184 Chronic kidney disease, stage 4 (severe): Secondary | ICD-10-CM | POA: Diagnosis not present

## 2019-09-27 DIAGNOSIS — N184 Chronic kidney disease, stage 4 (severe): Secondary | ICD-10-CM | POA: Diagnosis not present

## 2019-09-28 DIAGNOSIS — N184 Chronic kidney disease, stage 4 (severe): Secondary | ICD-10-CM | POA: Diagnosis not present

## 2019-09-29 DIAGNOSIS — N184 Chronic kidney disease, stage 4 (severe): Secondary | ICD-10-CM | POA: Diagnosis not present

## 2019-09-30 DIAGNOSIS — N184 Chronic kidney disease, stage 4 (severe): Secondary | ICD-10-CM | POA: Diagnosis not present

## 2019-10-01 DIAGNOSIS — N184 Chronic kidney disease, stage 4 (severe): Secondary | ICD-10-CM | POA: Diagnosis not present

## 2019-10-02 DIAGNOSIS — N184 Chronic kidney disease, stage 4 (severe): Secondary | ICD-10-CM | POA: Diagnosis not present

## 2019-10-03 DIAGNOSIS — N184 Chronic kidney disease, stage 4 (severe): Secondary | ICD-10-CM | POA: Diagnosis not present

## 2019-10-04 DIAGNOSIS — N184 Chronic kidney disease, stage 4 (severe): Secondary | ICD-10-CM | POA: Diagnosis not present

## 2019-10-05 DIAGNOSIS — N184 Chronic kidney disease, stage 4 (severe): Secondary | ICD-10-CM | POA: Diagnosis not present

## 2019-10-06 DIAGNOSIS — N184 Chronic kidney disease, stage 4 (severe): Secondary | ICD-10-CM | POA: Diagnosis not present

## 2019-10-07 ENCOUNTER — Other Ambulatory Visit (HOSPITAL_COMMUNITY): Payer: Self-pay | Admitting: Cardiology

## 2019-10-07 DIAGNOSIS — N184 Chronic kidney disease, stage 4 (severe): Secondary | ICD-10-CM | POA: Diagnosis not present

## 2019-10-07 MED FILL — VIT D2 1.25 MG (50,000 UNIT: 1.25 MG | 32 days supply | Qty: 9 | Fill #2

## 2019-10-07 MED FILL — CARVEDILOL 25 MG TABLET: 25 | 30 days supply | Qty: 60 | Fill #1

## 2019-10-07 MED FILL — hydrALAZINE HCL 100 MG TABS: 100 | 30 days supply | Qty: 90 | Fill #2

## 2019-10-07 MED FILL — NovoLOG 100 UNIT/ML SOLN: 100 | 22 days supply | Qty: 10 | Fill #2

## 2019-10-07 MED FILL — cloNIDine HCL 0.3 MG TABS: 0.3 | 30 days supply | Qty: 60 | Fill #1

## 2019-10-07 MED FILL — FUROSEMIDE 80 MG TAB: 80 | 30 days supply | Qty: 60 | Fill #0

## 2019-10-08 ENCOUNTER — Emergency Department (HOSPITAL_COMMUNITY): Payer: Medicaid Other

## 2019-10-08 ENCOUNTER — Inpatient Hospital Stay (HOSPITAL_COMMUNITY)
Admission: EM | Admit: 2019-10-08 | Discharge: 2019-10-17 | DRG: 673 | Disposition: A | Discharge status: Home / Self Care | Payer: Medicaid Other | Attending: Internal Medicine | Admitting: Internal Medicine

## 2019-10-08 ENCOUNTER — Encounter (HOSPITAL_COMMUNITY): Payer: Self-pay | Admitting: Emergency Medicine

## 2019-10-08 ENCOUNTER — Other Ambulatory Visit: Payer: Self-pay

## 2019-10-08 DIAGNOSIS — N1832 Chronic kidney disease, stage 3b: Secondary | ICD-10-CM | POA: Diagnosis present

## 2019-10-08 DIAGNOSIS — N2581 Secondary hyperparathyroidism of renal origin: Secondary | ICD-10-CM | POA: Diagnosis present

## 2019-10-08 DIAGNOSIS — N183 Chronic kidney disease, stage 3 unspecified: Secondary | ICD-10-CM | POA: Diagnosis not present

## 2019-10-08 DIAGNOSIS — E861 Hypovolemia: Secondary | ICD-10-CM | POA: Diagnosis present

## 2019-10-08 DIAGNOSIS — K219 Gastro-esophageal reflux disease without esophagitis: Secondary | ICD-10-CM | POA: Diagnosis present

## 2019-10-08 DIAGNOSIS — Z833 Family history of diabetes mellitus: Secondary | ICD-10-CM | POA: Diagnosis not present

## 2019-10-08 DIAGNOSIS — R438 Other disturbances of smell and taste: Secondary | ICD-10-CM | POA: Diagnosis present

## 2019-10-08 DIAGNOSIS — E1122 Type 2 diabetes mellitus with diabetic chronic kidney disease: Secondary | ICD-10-CM | POA: Diagnosis not present

## 2019-10-08 DIAGNOSIS — Z794 Long term (current) use of insulin: Secondary | ICD-10-CM

## 2019-10-08 DIAGNOSIS — E860 Dehydration: Secondary | ICD-10-CM | POA: Diagnosis not present

## 2019-10-08 DIAGNOSIS — M791 Myalgia, unspecified site: Secondary | ICD-10-CM | POA: Diagnosis present

## 2019-10-08 DIAGNOSIS — N184 Chronic kidney disease, stage 4 (severe): Secondary | ICD-10-CM | POA: Diagnosis not present

## 2019-10-08 DIAGNOSIS — Z888 Allergy status to other drugs, medicaments and biological substances status: Secondary | ICD-10-CM

## 2019-10-08 DIAGNOSIS — N022 Recurrent and persistent hematuria with diffuse membranous glomerulonephritis: Secondary | ICD-10-CM | POA: Diagnosis present

## 2019-10-08 DIAGNOSIS — J45909 Unspecified asthma, uncomplicated: Secondary | ICD-10-CM | POA: Diagnosis not present

## 2019-10-08 DIAGNOSIS — D631 Anemia in chronic kidney disease: Secondary | ICD-10-CM | POA: Diagnosis present

## 2019-10-08 DIAGNOSIS — Z79899 Other long term (current) drug therapy: Secondary | ICD-10-CM

## 2019-10-08 DIAGNOSIS — Z4901 Encounter for fitting and adjustment of extracorporeal dialysis catheter: Secondary | ICD-10-CM | POA: Diagnosis not present

## 2019-10-08 DIAGNOSIS — R110 Nausea: Secondary | ICD-10-CM | POA: Diagnosis not present

## 2019-10-08 DIAGNOSIS — N179 Acute kidney failure, unspecified: Secondary | ICD-10-CM | POA: Diagnosis not present

## 2019-10-08 DIAGNOSIS — I129 Hypertensive chronic kidney disease with stage 1 through stage 4 chronic kidney disease, or unspecified chronic kidney disease: Secondary | ICD-10-CM | POA: Diagnosis present

## 2019-10-08 DIAGNOSIS — Z800 Family history of malignant neoplasm of digestive organs: Secondary | ICD-10-CM

## 2019-10-08 DIAGNOSIS — E1165 Type 2 diabetes mellitus with hyperglycemia: Secondary | ICD-10-CM | POA: Diagnosis present

## 2019-10-08 DIAGNOSIS — K5900 Constipation, unspecified: Secondary | ICD-10-CM | POA: Diagnosis not present

## 2019-10-08 DIAGNOSIS — J1289 Other viral pneumonia: Secondary | ICD-10-CM | POA: Diagnosis not present

## 2019-10-08 DIAGNOSIS — Z8249 Family history of ischemic heart disease and other diseases of the circulatory system: Secondary | ICD-10-CM | POA: Diagnosis not present

## 2019-10-08 DIAGNOSIS — E785 Hyperlipidemia, unspecified: Secondary | ICD-10-CM | POA: Diagnosis not present

## 2019-10-08 DIAGNOSIS — Z7982 Long term (current) use of aspirin: Secondary | ICD-10-CM

## 2019-10-08 DIAGNOSIS — U071 COVID-19: Secondary | ICD-10-CM | POA: Diagnosis not present

## 2019-10-08 DIAGNOSIS — I428 Other cardiomyopathies: Secondary | ICD-10-CM | POA: Diagnosis present

## 2019-10-08 DIAGNOSIS — Z6836 Body mass index (BMI) 36.0-36.9, adult: Secondary | ICD-10-CM

## 2019-10-08 LAB — POC SARS CORONAVIRUS 2 AG -  ED
SARS Coronavirus 2 Ag: POSITIVE — AB
SARS Coronavirus 2 Ag: POSITIVE — AB

## 2019-10-08 LAB — CBC WITH DIFFERENTIAL/PLATELET
Abs Immature Granulocytes: 0.03 10*3/uL (ref 0.00–0.07)
Basophils Absolute: 0 10*3/uL (ref 0.0–0.1)
Basophils Relative: 0 %
Eosinophils Absolute: 0 10*3/uL (ref 0.0–0.5)
Eosinophils Relative: 0 %
HCT: 40.5 % (ref 39.0–52.0)
Hemoglobin: 12.9 g/dL — ABNORMAL LOW (ref 13.0–17.0)
Immature Granulocytes: 0 %
Lymphocytes Relative: 12 %
Lymphs Abs: 1 10*3/uL (ref 0.7–4.0)
MCH: 25.6 pg — ABNORMAL LOW (ref 26.0–34.0)
MCHC: 31.9 g/dL (ref 30.0–36.0)
MCV: 80.5 fL (ref 80.0–100.0)
Monocytes Absolute: 0.5 10*3/uL (ref 0.1–1.0)
Monocytes Relative: 6 %
Neutro Abs: 6.7 10*3/uL (ref 1.7–7.7)
Neutrophils Relative %: 82 %
Platelets: 180 10*3/uL (ref 150–400)
RBC: 5.03 MIL/uL (ref 4.22–5.81)
RDW: 14.6 % (ref 11.5–15.5)
WBC: 8.2 10*3/uL (ref 4.0–10.5)
nRBC: 0 % (ref 0.0–0.2)

## 2019-10-08 LAB — COMPREHENSIVE METABOLIC PANEL
ALT: 25 U/L (ref 0–44)
AST: 47 U/L — ABNORMAL HIGH (ref 15–41)
Albumin: 2 g/dL — ABNORMAL LOW (ref 3.5–5.0)
Alkaline Phosphatase: 51 U/L (ref 38–126)
Anion gap: 17 — ABNORMAL HIGH (ref 5–15)
BUN: 72 mg/dL — ABNORMAL HIGH (ref 6–20)
CO2: 17 mmol/L — ABNORMAL LOW (ref 22–32)
Calcium: 7.3 mg/dL — ABNORMAL LOW (ref 8.9–10.3)
Chloride: 104 mmol/L (ref 98–111)
Creatinine, Ser: 10.3 mg/dL — ABNORMAL HIGH (ref 0.61–1.24)
GFR calc Af Amer: 7 mL/min — ABNORMAL LOW (ref >60)
GFR calc non Af Amer: 6 mL/min — ABNORMAL LOW (ref >60)
Glucose, Bld: 220 mg/dL — ABNORMAL HIGH (ref 70–99)
Potassium: 4.4 mmol/L (ref 3.5–5.1)
Sodium: 138 mmol/L (ref 135–145)
Total Bilirubin: 0.6 mg/dL (ref 0.3–1.2)
Total Protein: 5.9 g/dL — ABNORMAL LOW (ref 6.5–8.1)

## 2019-10-08 LAB — D-DIMER, QUANTITATIVE: D-Dimer, Quant: 6.54 ug/mL-FEU — ABNORMAL HIGH (ref 0.00–0.50)

## 2019-10-08 LAB — URINALYSIS, ROUTINE W REFLEX MICROSCOPIC
Bilirubin Urine: NEGATIVE
Glucose, UA: 50 mg/dL — AB
Hgb urine dipstick: NEGATIVE
Ketones, ur: NEGATIVE mg/dL
Leukocytes,Ua: NEGATIVE
Nitrite: NEGATIVE
Protein, ur: >=300 mg/dL — AB
Specific Gravity, Urine: 1.014 (ref 1.005–1.030)
pH: 5 (ref 5.0–8.0)

## 2019-10-08 LAB — GLUCOSE, CAPILLARY: Glucose-Capillary: 314 mg/dL — ABNORMAL HIGH (ref 70–99)

## 2019-10-08 LAB — CREATININE, URINE, RANDOM: Creatinine, Urine: 107.2 mg/dL

## 2019-10-08 LAB — LIPASE, BLOOD: Lipase: 48 U/L (ref 11–51)

## 2019-10-08 LAB — MRSA PCR SCREENING: MRSA by PCR: NEGATIVE

## 2019-10-08 LAB — FERRITIN: Ferritin: 619 ng/mL — ABNORMAL HIGH (ref 24–336)

## 2019-10-08 LAB — CBG MONITORING, ED: Glucose-Capillary: 246 mg/dL — ABNORMAL HIGH (ref 70–99)

## 2019-10-08 LAB — C-REACTIVE PROTEIN: CRP: 8.1 mg/dL — ABNORMAL HIGH (ref <1.0)

## 2019-10-08 LAB — SODIUM, URINE, RANDOM: Sodium, Ur: 31 mmol/L

## 2019-10-08 MED ORDER — NITROGLYCERIN 0.3 MG SL SUBL
0.3000 mg | SUBLINGUAL_TABLET | SUBLINGUAL | Status: DC | PRN
  Filled 2019-10-08: qty 100

## 2019-10-08 MED ORDER — HEPARIN SODIUM (PORCINE) 5000 UNIT/ML IJ SOLN
5000.0000 [IU] | Freq: Three times a day (TID) | INTRAMUSCULAR | Status: DC
  Administered 2019-10-08 – 2019-10-11 (×9): 5000 [IU] via SUBCUTANEOUS
  Filled 2019-10-08 (×9): qty 1

## 2019-10-08 MED ORDER — HYDRALAZINE HCL 50 MG PO TABS
100.0000 mg | ORAL_TABLET | Freq: Three times a day (TID) | ORAL | Status: DC
  Administered 2019-10-08 – 2019-10-09 (×2): 100 mg via ORAL
  Filled 2019-10-08 (×3): qty 2

## 2019-10-08 MED ORDER — TACROLIMUS 1 MG PO CAPS: 4.0000 mg | ORAL_CAPSULE | Freq: Two times a day (BID) | ORAL | Status: DC

## 2019-10-08 MED ORDER — INSULIN ASPART 100 UNIT/ML ~~LOC~~ SOLN
0.0000 [IU] | Freq: Three times a day (TID) | SUBCUTANEOUS | Status: DC
  Administered 2019-10-08: 17:00:00 5 [IU] via SUBCUTANEOUS
  Administered 2019-10-09 (×2): 3 [IU] via SUBCUTANEOUS
  Administered 2019-10-09 – 2019-10-10 (×2): 2 [IU] via SUBCUTANEOUS
  Administered 2019-10-10: 12:00:00 5 [IU] via SUBCUTANEOUS
  Administered 2019-10-10: 8 [IU] via SUBCUTANEOUS
  Administered 2019-10-11 – 2019-10-12 (×3): 2 [IU] via SUBCUTANEOUS
  Administered 2019-10-12: 17:00:00 5 [IU] via SUBCUTANEOUS
  Administered 2019-10-13 – 2019-10-14 (×4): 3 [IU] via SUBCUTANEOUS
  Administered 2019-10-14: 5 [IU] via SUBCUTANEOUS
  Administered 2019-10-14 – 2019-10-15 (×2): 2 [IU] via SUBCUTANEOUS
  Administered 2019-10-15: 5 [IU] via SUBCUTANEOUS
  Administered 2019-10-15 – 2019-10-16 (×2): 2 [IU] via SUBCUTANEOUS
  Administered 2019-10-16 (×2): 3 [IU] via SUBCUTANEOUS
  Administered 2019-10-17: 2 [IU] via SUBCUTANEOUS

## 2019-10-08 MED ORDER — TACROLIMUS 1 MG PO CAPS: 2.0000 mg | ORAL_CAPSULE | Freq: Two times a day (BID) | ORAL | Status: DC

## 2019-10-08 MED ORDER — LABETALOL HCL 5 MG/ML IV SOLN: 5.0000 mg | INTRAVENOUS | Status: DC | PRN

## 2019-10-08 MED ORDER — SODIUM CHLORIDE 0.9 % IV BOLUS
1000.0000 mL | Freq: Once | INTRAVENOUS | Status: AC
  Administered 2019-10-08: 13:00:00 1000 mL via INTRAVENOUS

## 2019-10-08 MED ORDER — ONDANSETRON HCL 4 MG/2ML IJ SOLN
4.0000 mg | Freq: Once | INTRAMUSCULAR | Status: AC
  Administered 2019-10-08: 11:00:00 4 mg via INTRAVENOUS
  Filled 2019-10-08: qty 2

## 2019-10-08 MED ORDER — HYDRALAZINE HCL 100 MG PO TABS: 50.0000 mg | ORAL_TABLET | Freq: Three times a day (TID) | ORAL | Status: DC

## 2019-10-08 MED ORDER — SODIUM CHLORIDE 0.9 % IV BOLUS
1000.0000 mL | Freq: Once | INTRAVENOUS | Status: AC
  Administered 2019-10-08: 11:00:00 1000 mL via INTRAVENOUS

## 2019-10-08 MED ORDER — INSULIN GLARGINE 100 UNIT/ML ~~LOC~~ SOLN
15.0000 [IU] | Freq: Every day | SUBCUTANEOUS | Status: DC
  Administered 2019-10-08 – 2019-10-16 (×9): 15 [IU] via SUBCUTANEOUS
  Filled 2019-10-08 (×10): qty 0.15

## 2019-10-08 MED ORDER — ATORVASTATIN CALCIUM 80 MG PO TABS
80.0000 mg | ORAL_TABLET | Freq: Every day | ORAL | Status: DC
  Administered 2019-10-09 – 2019-10-17 (×9): 80 mg via ORAL
  Filled 2019-10-08 (×9): qty 1

## 2019-10-08 MED ORDER — CARVEDILOL 25 MG PO TABS
25.0000 mg | ORAL_TABLET | Freq: Two times a day (BID) | ORAL | Status: DC
  Administered 2019-10-09 – 2019-10-16 (×15): 25 mg via ORAL
  Filled 2019-10-08 (×15): qty 1

## 2019-10-08 MED ORDER — LIDOCAINE VISCOUS HCL 2 % MT SOLN
15.0000 mL | Freq: Once | OROMUCOSAL | Status: AC
  Administered 2019-10-08: 22:00:00 15 mL via ORAL
  Filled 2019-10-08: qty 15

## 2019-10-08 MED ORDER — FAMOTIDINE 20 MG PO TABS
10.0000 mg | ORAL_TABLET | Freq: Every day | ORAL | Status: DC
  Administered 2019-10-09 – 2019-10-17 (×9): 10 mg via ORAL
  Filled 2019-10-08 (×9): qty 1

## 2019-10-08 MED ORDER — AMLODIPINE BESYLATE 10 MG PO TABS
10.0000 mg | ORAL_TABLET | Freq: Every day | ORAL | Status: DC
  Administered 2019-10-08 – 2019-10-16 (×9): 10 mg via ORAL
  Filled 2019-10-08 (×4): qty 1
  Filled 2019-10-08: qty 2
  Filled 2019-10-08 (×5): qty 1

## 2019-10-08 MED ORDER — CLONIDINE HCL 0.2 MG PO TABS
0.3000 mg | ORAL_TABLET | Freq: Two times a day (BID) | ORAL | Status: DC
  Administered 2019-10-09 – 2019-10-17 (×17): 0.3 mg via ORAL
  Filled 2019-10-08 (×17): qty 1

## 2019-10-08 MED ORDER — SODIUM CHLORIDE 0.9 % IV SOLN: INTRAVENOUS | Status: DC

## 2019-10-08 MED ORDER — HYDRALAZINE HCL 100 MG PO TABS: 100.0000 mg | ORAL_TABLET | Freq: Three times a day (TID) | ORAL | Status: DC

## 2019-10-08 MED ORDER — MORPHINE SULFATE (PF) 4 MG/ML IV SOLN
4.0000 mg | Freq: Once | INTRAVENOUS | Status: AC
  Administered 2019-10-08: 11:00:00 4 mg via INTRAVENOUS
  Filled 2019-10-08: qty 1

## 2019-10-08 MED ORDER — ALUM & MAG HYDROXIDE-SIMETH 200-200-20 MG/5ML PO SUSP
30.0000 mL | Freq: Once | ORAL | Status: AC
  Administered 2019-10-08: 22:00:00 30 mL via ORAL
  Filled 2019-10-08: qty 30

## 2019-10-08 NOTE — Consult Note (Signed)
Renal Service Consult Note [facility]  Jeremy [name] 10/08/2019 [name] Requesting Physician:  Dr Heber Zarephath  Reason for Consult:  CKD patient w/ COVID infection HPI: The patient is a 39 y.o. year-old with hx of DM2, NICM, membranous GN, HL, HTN, CKD and anemia who contracted the COVID-19 virus testing + about 6 days prior. He has had progressive problems w/ GI c/o's, fatigue, nausea and dark urine, fevers. No sig SOB or cough. + some abd pain and loose stools. Unable to keep anything down. In ED BUN 72 and creat 10.3 (BL 2- 2.5). Asked to see for renal failure.   Bladder scan in ED was 147 cc, I/O showed 200 cc urine. UA showed >300 prot, a few rbcs. Other labs pending.  CXR has small L sided poss PNA / density. No edema. Pt rec'd IVF x 2 L in ED and IV meds for pain and nausea.   Pt seen in ED, feeling somewhat better. Denies any confusion or jerking, wants something to eat.      ROS  denies CP  no joint pain   no HA  no blurry vision  no rash  no dysuria  no difficulty voiding    Past Medical History  Past Medical History:  Diagnosis Date  . Abscess of left groin   . Acute blood loss anemia 11/11/2013  . Anemia in chronic kidney disease (CKD)   . Asthma   . Boil of scrotum 11/21/2015  . Chest pain    a. 01/2015 Lexiscan MV: EF 28%, inferior, inferolateral, apical ischemia;  b. 01/2015 Cath: nl cors, PCWP 18 mmHg, CO 9.38 L/min, CI 3.53 L/min/m^2.  . Essential hypertension   . Hyperlipidemia   . Membranous glomerulonephritis   . Morbid obesity ([place])   . Nonischemic cardiomyopathy ([place])    a. 01/2015 Echo: EF 20-25%, diff HK, Gr 2 DD, Triv AI, mildly dil LA and Ao root.  . Type II diabetes mellitus ([place])    a. 01/2015 HbA1c = 8.9.   Past Surgical History  Past Surgical History:  Procedure Laterality Date  . CARDIAC CATHETERIZATION N/A 02/01/2015   Procedure: Right/Left Heart Cath and Coronary Angiography;  Surgeon: Josue Hector, MD;  Location: [facility];  Service: Cardiovascular;  Laterality: N/A;  . THORACOTOMY Left 11/08/2013   Procedure: LEFT THORACOTOMY;  Surgeon: Ivin Poot, MD;  Location: [facility] OR;  Service: Thoracic;  Laterality: Left;   Family History  Family History  Problem Relation Age of Onset  . Diabetes Mellitus II Mother        died @ 71.  . Gastric cancer Mother   . CAD Father        died @ 65.  [name] Heart attack Father   . Congestive Heart Failure Father   . Diabetes Mellitus II Sister   . CAD Sister        s/p PCI - age 62.   Social History  reports that he has never smoked. He has never used smokeless tobacco. He reports current drug use. Drug: Marijuana. He reports that he does not drink alcohol. Allergies  Allergies  Allergen Reactions  . Acyclovir And Related    Home medications Prior to Admission medications   Medication Sig Start Date End Date Taking? Authorizing Provider  Accu-Chek FastClix Lancets MISC Check your blood sugars 4 times a day. 08/01/19   Katherine Roan, MD  ACCU-CHEK GUIDE test strip USE TO CHECK BLOOD SUGAR 4 TIMES DAILY  08/01/19   Katherine Roan, MD  amLODipine (NORVASC) 10 MG tablet Take 1 tablet (10 mg total) by mouth at bedtime. 03/04/19   [name], MD  ASPIR-LOW 81 MG EC tablet TAKE 1 TABLET BY MOUTH DAILY. 09/14/15   Clegg, Amy D, NP  atorvastatin (LIPITOR) 80 MG tablet TAKE 1 TABLET BY MOUTH DAILY. 06/01/19   Katherine Roan, MD  blood glucose meter kit and supplies KIT Dispense based on patient and insurance preference. Use up to four times daily as directed. (FOR ICD-9 250.00, 250.01). 07/21/18   Katherine Roan, MD  carvedilol (COREG) 25 MG tablet TAKE 1 TABLET (25 MG TOTAL) BY MOUTH TWICE A DAY WITH A MEAL. 08/26/19   [name], MD  cloNIDine (CATAPRES) 0.3 MG tablet Take 1 tablet (0.3 mg total) by mouth 2 (two) times daily. 08/01/19   [name], MD  famotidine (PEPCID) 10 MG tablet Take 1 tablet (10 mg total) by mouth daily as needed  for heartburn or indigestion. 07/21/18   Katherine Roan, MD  furosemide (LASIX) 80 MG tablet TAKE 1 TABLET BY MOUTH TWICE A DAY 10/07/19   [name], MD  glipiZIDE (GLUCOTROL) 5 MG tablet Take 1 tablet (5 mg total) by mouth daily. 02/11/19   Katherine Roan, MD  hydrALAZINE (APRESOLINE) 100 MG tablet TAKE 1 TABLET BY MOUTH 3 TIMES DAILY 06/28/19   [name], MD  insulin glargine (LANTUS) 100 UNIT/ML injection INJECT 36 UNITS INTO THE SKIN AT BEDTIME. 04/29/19   Katherine Roan, MD  Insulin Syringe-Needle U-100 (ULTICARE INSULIN SYRINGE) 30G X 1/2" 0.5 ML MISC USE 4 TIMES DAILY WITH INSULIN INJECTIONS. 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 06/28/19   [name], MD  NOVOLOG 100 UNIT/ML injection INJECT 15 UNITS 3 TIMES A DAY WITH MEALS INTO THE SKIN. 08/01/19   Katherine Roan, MD  potassium chloride SA (K-DUR) 20 MEQ tablet TAKE 1 TABLET BY MOUTH DAILY. 04/14/19   [name], MD  tacrolimus (PROGRAF) 1 MG capsule Take 4 mg by mouth 2 (two) times daily.  12/16/16   [provider]  UNIFINE PENTIPS 32G X 4 MM MISC USE ONCE DAILY WITH VICTOZA 08/01/19   Katherine Roan, MD   Recent Labs  Lab 10/08/19 1056  WBC 8.2  NEUTROABS 6.7  HGB 12.9*  HCT 40.5  MCV 80.5  PLT 180   Recent Labs  Lab 10/08/19 1056  NA 138  K 4.4  CL 104  CO2 17*  GLUCOSE 220*  BUN 72*  CREATININE 10.30*  CALCIUM 7.3*   Iron/TIBC/Ferritin/ %Sat    Component Value Date/Time   IRON 82 04/20/2017 1400   TIBC 185 (L) 04/20/2017 1400   FERRITIN 241 04/20/2017 1400   IRONPCTSAT 44 (H) 04/20/2017 1400   IRONPCTSAT 54 01/09/2015 1634    Vitals:   10/08/19 1130 10/08/19 1229 10/08/19 1300 10/08/19 1530  BP: (!) 155/110 (!) 163/116 (!) 169/105 (!) 162/97  Pulse: 72 79 74   Resp: (!) _0 Temp:  98.4 F (36.9 C)     TempSrc:  Oral    SpO2: 100% 99% 96%     Exam Gen alert, no distress, nasal O2 No rash, cyanosis or gangrene Sclera anicteric, throat clear  No jvd or bruits ,  flat neck veins Chest clear bilat to bases RRR no MRG Abd soft ntnd no mass or ascites +bs GU normal male MS no joint effusions or deformity Ext no LE or UE edema, no wounds or ulcers Neuro is alert, Ox 3 , nf , no asterixis    Home meds:  - furosemide 80 bid/ amlodipine 10/ carvedilol 25 bid/ clonidine 0.3 bid/ hydralazine 100 tid  - tacrolimus 25m bid  - sl ntg prn/ atrovastatin 80/ aspirin 81  - insulin glargine 36u hs/ insulin novolog 15u tid/ glipizide [id number]  - pepcid 10 prn/ kdur 20   UA 1/30 nazy , prot > 300, few bact, 6-10 rbc, no wbc   CXR 1/30 LLL hazy ill defined infiltrate, poss pna         Assessment/ Plan: 1. 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 Has had sig GI losses it appears and on exam has sig room for volume.  Suspect AKI related to dehydration w/ n/v/d, and pt continued taking lasix. Did stop prograf 6d ago per neph MD's recommendation. Has membranous , on prograf for this for a good while. Recommendation vol expansion w/ saline. Pt nauseous and fatigued but hard to differentiate symptoms of uremia from those of COVID infection.  No strong indication for RRT and pt looks good clinically today so will hold off on any dialysis for now.  2. MGN - on prograf, told to hold until COVID over 3. HTN - bp's stable, okay to resume home meds , keep SBP > 110- 120 w/ AKI though 4. COVID+ infection - per primary 5. DM 2 - on insulin      RoKelly SplinterMD 10/08/2019, 4:11 PM

## 2019-10-08 NOTE — Plan of Care (Signed)

## 2019-10-08 NOTE — ED Provider Notes (Addendum)
[facility] EMERGENCY DEPARTMENT Provider Note   CSN: [id number] Arrival date & time: 10/08/19  0272     History Chief Complaint  Patient presents with  . COVID +  . multiple complaints    Jeremy [name] is a 39 y.o. male.  The history is provided by the patient. No language interpreter was used.     40 year old morbidly obese male with history of hypertension hyperlipidemia diabetes asthma previously diagnosed with COVID-19 5 days ago presenting complaining of not feeling well.  Patient report a week ago his girlfriend was diagnosed with COVID-19.  5 days ago he was tested positive for COVID-19.  He endorsed progressive worsening fever, chills, body aches, decrease in appetite, mild nausea, dry cough, shortness of breath, loss of taste and smell, myalgias, abdominal discomfort, and having dark stool and dark urine.  States he has no energy to do anything.  He mostly stayed in bed.  He takes Tylenol as needed for his pain.  He felt that due to his chronic medical problems and progressive worsening symptoms thus prompting this ER visit.  He denies taking NSAIDs.  Past Medical History:  Diagnosis Date  . Abscess of left groin   . Acute blood loss anemia 11/11/2013  . Anemia in chronic kidney disease (CKD)   . Asthma   . Boil of scrotum 11/21/2015  . Chest pain    a. 01/2015 Lexiscan MV: EF 28%, inferior, inferolateral, apical ischemia;  b. 01/2015 Cath: nl cors, PCWP 18 mmHg, CO 9.38 L/min, CI 3.53 L/min/m^2.  . Essential hypertension   . Hyperlipidemia   . Membranous glomerulonephritis   . Morbid obesity ([place])   . Nonischemic cardiomyopathy ([place])    a. 01/2015 Echo: EF 20-25%, diff HK, Gr 2 DD, Triv AI, mildly dil LA and Ao root.  . Type II diabetes mellitus ([place])    a. 01/2015 HbA1c = 8.9.    Patient Active Problem List   Diagnosis Date Noted  . High risk heterosexual behavior 12/06/2018  . Gluteal pain 09/23/2018  . Mass of soft tissue of face 09/23/2018  .  Need for prophylactic vaccination against Streptococcus pneumoniae (pneumococcus) 03/30/2018  . Onychomycosis due to dermatophyte 03/29/2018  . Routine screening for STI (sexually transmitted infection) 01/26/2017  . Anemia, iron deficiency 07/18/2016  . Anemia in chronic kidney disease 07/18/2016  . Abscess of superficial perineal space 06/12/2016  . CKD (chronic kidney disease) stage 4, GFR 15-29 ml/min (HCC) 05/29/2016  . Pneumococcal vaccination indicated 04/12/2016  . Suspected sleep apnea 05/24/2015  . Chronic combined systolic and diastolic CHF (congestive heart failure) ([place]) 03/01/2015  . Nonischemic cardiomyopathy ([place])   . Chest pain 01/09/2015  . Traumatic hemopneumothorax 11/11/2013  . Asthma   . Hyperlipidemia 03/31/2012  . Obesity, Class I, BMI 30-34.9 03/21/2011  . Essential hypertension 03/21/2011  . Gastroesophageal reflux disease 03/21/2011  . Type 2 diabetes mellitus with diabetic nephropathy ([place]) 02/20/2011    Past Surgical History:  Procedure Laterality Date  . CARDIAC CATHETERIZATION N/A 02/01/2015   Procedure: Right/Left Heart Cath and Coronary Angiography;  Surgeon: Josue Hector, MD;  Location: [place] [facility] LAB;  Service: Cardiovascular;  Laterality: N/A;  . THORACOTOMY Left 11/08/2013   Procedure: LEFT THORACOTOMY;  Surgeon: Ivin Poot, MD;  Location: [facility] OR;  Service: Thoracic;  Laterality: Left;       Family History  Problem Relation Age of Onset  . Diabetes Mellitus II Mother        died @  65.  . Gastric cancer Mother   . CAD Father        died @ 51.  [name] Heart attack Father   . Congestive Heart Failure Father   . Diabetes Mellitus II Sister   . CAD Sister        s/p PCI - age 60.    Social History   Tobacco Use  . Smoking status: Never Smoker  . Smokeless tobacco: Never Used  Substance Use Topics  . Alcohol use: No    Alcohol/week: 0.0 standard drinks  . Drug use: Yes    Types: Marijuana    Comment: smoked marijuana 2-3 x/wk.     Home Medications Prior to Admission medications   Medication Sig Start Date End Date Taking? Authorizing Provider  Accu-Chek FastClix Lancets MISC Check your blood sugars 4 times a day. 08/01/19   Katherine Roan, MD  ACCU-CHEK GUIDE test strip USE TO CHECK BLOOD SUGAR 4 TIMES DAILY 08/01/19   Katherine Roan, MD  amLODipine (NORVASC) 10 MG tablet Take 1 tablet (10 mg total) by mouth at bedtime. 03/04/19   [name], MD  ASPIR-LOW 81 MG EC tablet TAKE 1 TABLET BY MOUTH DAILY. 09/14/15   Clegg, Amy D, NP  atorvastatin (LIPITOR) 80 MG tablet TAKE 1 TABLET BY MOUTH DAILY. 06/01/19   Katherine Roan, MD  blood glucose meter kit and supplies KIT Dispense based on patient and insurance preference. Use up to four times daily as directed. (FOR ICD-9 250.00, 250.01). 07/21/18   Katherine Roan, MD  carvedilol (COREG) 25 MG tablet TAKE 1 TABLET (25 MG TOTAL) BY MOUTH TWICE A DAY WITH A MEAL. 08/26/19   [name], MD  cloNIDine (CATAPRES) 0.3 MG tablet Take 1 tablet (0.3 mg total) by mouth 2 (two) times daily. 08/01/19   [name], MD  famotidine (PEPCID) 10 MG tablet Take 1 tablet (10 mg total) by mouth daily as needed for heartburn or indigestion. 07/21/18   Katherine Roan, MD  furosemide (LASIX) 80 MG tablet TAKE 1 TABLET BY MOUTH TWICE A DAY 10/07/19   [name], MD  glipiZIDE (GLUCOTROL) 5 MG tablet Take 1 tablet (5 mg total) by mouth daily. 02/11/19   Katherine Roan, MD  hydrALAZINE (APRESOLINE) 100 MG tablet TAKE 1 TABLET BY MOUTH 3 TIMES DAILY 06/28/19   [name], MD  insulin glargine (LANTUS) 100 UNIT/ML injection INJECT 36 UNITS INTO THE SKIN AT BEDTIME. 04/29/19   Katherine Roan, MD  Insulin Syringe-Needle U-100 (ULTICARE INSULIN SYRINGE) 30G X 1/2" 0.5 ML MISC USE 4 TIMES DAILY WITH INSULIN INJECTIONS. 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 06/28/19   [name], MD  NOVOLOG 100 UNIT/ML injection INJECT 15 UNITS 3 TIMES A DAY WITH MEALS INTO THE SKIN. 08/01/19   Katherine Roan, MD  potassium chloride SA (K-DUR) 20 MEQ tablet TAKE 1 TABLET BY MOUTH DAILY. 04/14/19   [name], MD  tacrolimus (PROGRAF) 1 MG capsule Take 4 mg by mouth 2 (two) times daily.  12/16/16   [provider]  UNIFINE PENTIPS 32G X 4 MM MISC USE ONCE DAILY WITH VICTOZA 08/01/19   Katherine Roan, MD    Allergies    Acyclovir and related  Review of Systems   Review of  Systems  All other systems reviewed and are negative.   Physical Exam Updated Vital Signs BP (!) 169/105   Pulse 74   Temp 98.4 F (36.9 C) (Oral)   Resp 14   SpO2 96%   Physical Exam Vitals and nursing note reviewed.  Constitutional:      General: He is not in acute distress.    Appearance: He is well-developed.     Comments: Patient is well-appearing, in no acute discomfort.  HENT:     Head: Atraumatic.  Eyes:     Conjunctiva/sclera: Conjunctivae normal.  Cardiovascular:     Rate and Rhythm: Normal rate and regular rhythm.     Pulses: Normal pulses.     Heart sounds: Normal heart sounds.  Pulmonary:     Effort: Pulmonary effort is normal.     Breath sounds: Normal breath sounds. No wheezing, rhonchi or rales.     Comments: Occasional cough however lungs are clear on auscultation Abdominal:     Palpations: Abdomen is soft.     Tenderness: There is no abdominal tenderness.  Musculoskeletal:     Cervical back: Neck supple.  Skin:    Findings: No rash.  Neurological:     Mental Status: He is alert and oriented to person, place, and time.  Psychiatric:        Mood and Affect: Mood normal.     ED Results / Procedures / Treatments   Labs (all labs ordered are listed, but only abnormal results are displayed) Labs Reviewed  CBC WITH DIFFERENTIAL/PLATELET - Abnormal; Notable for the following  components:      Result Value   Hemoglobin 12.9 (*)    MCH 25.6 (*)    All other components within normal limits  COMPREHENSIVE METABOLIC PANEL - Abnormal; Notable for the following components:   CO2 17 (*)    Glucose, Bld 220 (*)    BUN 72 (*)    Creatinine, Ser 10.30 (*)    Calcium 7.3 (*)    Total Protein 5.9 (*)    Albumin 2.0 (*)    AST 47 (*)    GFR calc non Af Amer 6 (*)    GFR calc Af Amer 7 (*)    Anion gap 17 (*)    All other components within normal limits  URINALYSIS, ROUTINE W REFLEX MICROSCOPIC - Abnormal; Notable for the following components:   APPearance HAZY (*)    Glucose, UA 50 (*)    Protein, ur >=300 (*)    Bacteria, UA FEW (*)    All other components within normal limits  POC SARS CORONAVIRUS 2 AG -  ED - Abnormal; Notable for the following components:   SARS Coronavirus 2 Ag POSITIVE (*)    All other components within normal limits  POC SARS CORONAVIRUS 2 AG -  ED - Abnormal; Notable for the following components:   SARS Coronavirus 2 Ag POSITIVE (*)    All other components within normal limits  CULTURE, BLOOD (ROUTINE X 2)  CULTURE, BLOOD (ROUTINE X 2)  LIPASE, BLOOD  LACTIC ACID, PLASMA  LACTIC ACID, PLASMA  D-DIMER, QUANTITATIVE (NOT AT [facility])  PROCALCITONIN  LACTATE DEHYDROGENASE  FERRITIN  TRIGLYCERIDES  FIBRINOGEN  C-REACTIVE PROTEIN    EKG None  Radiology DG Chest Portable 1 View  Result Date: 10/08/2019 CLINICAL DATA:  Patient with recent diagnosis of COVID-19. Generalized abdominal pain. EXAM: PORTABLE CHEST 1 VIEW COMPARISON:  Chest radiograph 05/30/2016 FINDINGS: Monitoring leads overlie the patient. Stable cardiac and  mediastinal contours. Interval development of patchy consolidation within the left mid lung. No pleural effusion or pneumothorax. IMPRESSION: Left mid lung patchy consolidation which may represent pneumonia in the appropriate clinical setting. Electronically Signed   By: Lovey Newcomer M.D.   On: 10/08/2019 11:36     Procedures .Critical Care Performed by: Domenic Moras, PA-C Authorized by: Domenic Moras, PA-C   Critical care provider statement:    Critical care time (minutes):  31   Critical care was time spent personally by me on the following activities:  Discussions with consultants, evaluation of patient's response to treatment, examination of patient, ordering and performing treatments and interventions, ordering and review of laboratory studies, ordering and review of radiographic studies, pulse oximetry, re-evaluation of patient's condition, obtaining history from patient or surrogate and review of old charts   (including critical care time)  Medications Ordered in ED Medications  morphine 4 MG/ML injection 4 mg (4 mg Intravenous Given 10/08/19 1052)  ondansetron (ZOFRAN) injection 4 mg (4 mg Intravenous Given 10/08/19 1051)  sodium chloride 0.9 % bolus 1,000 mL (1,000 mLs Intravenous New Bag/Given 10/08/19 1051)  sodium chloride 0.9 % bolus 1,000 mL (1,000 mLs Intravenous New Bag/Given 10/08/19 1246)    ED Course  I have reviewed the triage vital signs and the nursing notes.  Pertinent labs & imaging results that were available during my care of the patient were reviewed by me and considered in my medical decision making (see chart for details).    MDM Rules/Calculators/A&P                      BP (!) 169/105   Pulse 74   Temp 98.4 F (36.9 C) (Oral)   Resp 14   SpO2 96%   Final Clinical Impression(s) / ED Diagnoses Final diagnoses:  AKI (acute kidney injury) ([place])  COVID-19 virus infection  Dehydration    Rx / DC Orders ED Discharge Orders    None     10:31 AM Patient with multiple comorbidities, previously diagnosed with COVID-19 5 days ago here with symptoms consistent with COVID-19 infection.  He is afebrile, no hypoxia, lungs clear on auscultation and abdomen without any significant tenderness on palpation.  Will provide symptomatic treatment, check labs, and will  monitor closely.  1:31 PM Labs remarkable for significantly worsening renal function.  BUN 72, creatinine 10.3.  He has history of chronic kidney disease, baseline creatinine is usually 2, however this is a significant changes from last value of 2.55 three months ago. Pt sts he is taking Prograft but having to come off from it while sick with COVID-19.  Bladder scan showing 147cc, In and Out cath performed to obtain UA which shows >300 protein but no ketone or signs of UTI.  Doubt urinary retention causing impaired kidney function. Suspect dehydration from lack of appetite. Will consult nephrology and will also admit pt for further care.    2:28 PM Appreciate consultation from on-call nephrologist, Dr. Jonnie Finner, who agrees to be involved in patient care.  Suspect dehydration causing worsening renal function.  Will consult medicine for admission.  We will continue with gentle hydration.  Jeremy [name] was evaluated in Emergency Department on 10/08/2019 for the symptoms described in the history of present illness. He was evaluated in the context of the global COVID-19 pandemic, which necessitated consideration that the patient might be at risk for infection with the SARS-CoV-2 virus that causes COVID-19. Institutional protocols and algorithms that pertain to the evaluation  of patients at risk for COVID-19 are in a state of rapid change based on information released by regulatory bodies including the CDC and federal and state organizations. These policies and algorithms were followed during the patient's care in the ED.  3:24 PM Appreciate consultation from internal medicine resident who agrees to see and admit patient for further management.   Domenic Moras, PA-C 10/08/19 [place], Flora, MD 10/09/19 1116  ADDENDUM: CC time added   Domenic Moras, PA-C 10/20/19 [place], MD 10/21/19 [phone]

## 2019-10-08 NOTE — ED Triage Notes (Signed)
Pt states he was diagnosed with COVID on Sunday.  C/o SOB, generalized abd pain, generalized weakness, and vomiting.

## 2019-10-08 NOTE — H&P (Addendum)
Date: 10/08/2019               Patient Name:  Jeremy [name] MRN: [id number]  DOB: 02-Aug-1981 Age / Sex: 39 y.o., male   PCP: Katherine Roan, MD         Medical Service: Internal Medicine Teaching Service         Attending Physician: Dr. Charlesetta Shanks, MD    First Contact: Sheppard Coil, MD, Mitzi Hansen Pager: [place] [phone])  Second Contact: Eileen Stanford, MD, [place] Pager: [phone])       After Hours (After 5p/  First Contact Pager: [phone]  weekends / holidays): Second Contact Pager: [phone]   Chief Complaint: Malaise    History of Present Illness: Jeremy [name] is a pleasant 39 year old gentleman with a past medical history significant for FSGS and membranous nephropathy, stage IV kidney disease, HTN, HLD, T2DM, asthma who presents with worsening fever, chills, body aches, decreased appetite, loss of smell and taste, abdominal discomfort and dark urine. Patient was diagnosed with COVID-19 six days ago, but his symptoms have been progressively getting worse. His energy has been extremely low, stating that he has no energy to do anything and has been staying mostly in bed. He has only tried eating some yogurt and ginger ale but experienced nausea and vomiting yesterday. This morning, he was febrile to 101F which improved with Tylenol. He states that he contracted COVID from his girlfriend. They also have 2 children who have it as well. They have been quarantining since he first discovered that he had sick contacts since Sunday. He has been compliant with all his medications otherwise. He has not taken his medications this AM. He denies chest pain, but endorses shortness of breath and abdominal pain. He endorses a 4 days history of "dark stools" since his COVID symptoms started but denies hematochezia or BRBPR. He has been urinating but to a lesser extent.   In the ED, his most notable lab was an elevated BUN and creatinine to 72 and 10.3 (BL 2.0-2.5) effectively.  Bladder scan demonstrated  147 cc. Pt was in and out cathed and about 200 cc came out. Urinalysis demonstrated small amounts of glucose and proteinuria.  CBC was unremarkable. A procalcitonin, lactate, blood cultures, and laboratory labs were drawn but are currently pending, including LDH, D-dimer, ferritin, fibrinogen and CRP.  Chest x-ray was significant for left midlung patchy consolidation, which may represent pneumonia.  Patient received 2 L of IV fluids as well as a dose of Zofran and morphine 4mg  in the ED.  Meds:  No outpatient medications have been marked as taking for the 10/08/19 encounter [facility] Encounter).   Allergies: Allergies as of 10/08/2019 - Review Complete 06/28/2019  Allergen Reaction Noted  . Acyclovir and related  11/28/2016   Past Medical History:  Diagnosis Date  . Abscess of left groin   . Acute blood loss anemia 11/11/2013  . Anemia in chronic kidney disease (CKD)   . Asthma   . Boil of scrotum 11/21/2015  . Chest pain    a. 01/2015 Lexiscan MV: EF 28%, inferior, inferolateral, apical ischemia;  b. 01/2015 Cath: nl cors, PCWP 18 mmHg, CO 9.38 L/min, CI 3.53 L/min/m^2.  . Essential hypertension   . Hyperlipidemia   . Membranous glomerulonephritis   . Morbid obesity ([place])   . Nonischemic cardiomyopathy ([place])    a. 01/2015 Echo: EF 20-25%, diff HK, Gr 2 DD, Triv AI, mildly dil LA and Ao root.  . Type II diabetes mellitus (  [place])    a. 01/2015 HbA1c = 8.9.   Past Surgical History:  Procedure Laterality Date  . CARDIAC CATHETERIZATION N/A 02/01/2015   Procedure: Right/Left Heart Cath and Coronary Angiography;  Surgeon: Josue Hector, MD;  Location: [place] [facility] LAB;  Service: Cardiovascular;  Laterality: N/A;  . THORACOTOMY Left 11/08/2013   Procedure: LEFT THORACOTOMY;  Surgeon: Ivin Poot, MD;  Location: [facility] OR;  Service: Thoracic;  Laterality: Left;   Family History:  Family History  Problem Relation Age of Onset  . Diabetes Mellitus II Mother        died @ 89.  . Gastric cancer  Mother   . CAD Father        died @ 34.  [name] Heart attack Father   . Congestive Heart Failure Father   . Diabetes Mellitus II Sister   . CAD Sister        s/p PCI - age 79.    Social History:  Social History   Tobacco Use  . Smoking status: Never Smoker  . Smokeless tobacco: Never Used  Substance Use Topics  . Alcohol use: No    Alcohol/week: 0.0 standard drinks  . Drug use: Yes    Types: Marijuana    Comment: smoked marijuana 2-3 x/wk.    Review of Systems: A complete ROS was negative except as per HPI.   Imaging: EKG: Pending  CXR:  IMPRESSION: Left mid lung patchy consolidation which may represent pneumonia in the appropriate clinical setting.  Physical Exam: Blood pressure (!) 169/105, pulse 74, temperature 98.4 F (36.9 C), temperature source Oral, resp. rate 14, SpO2 96 %.  Physical Exam Vitals reviewed.  Constitutional:      General: He is not in acute distress.    Appearance: Normal appearance. He is obese. He is ill-appearing. He is not toxic-appearing.  HENT:     Head: Normocephalic and atraumatic.  Eyes:     Extraocular Movements: Extraocular movements intact.     Conjunctiva/sclera: Conjunctivae normal.  Cardiovascular:     Rate and Rhythm: Normal rate and regular rhythm.     Pulses: Normal pulses.     Heart sounds: Normal heart sounds. No murmur. No friction rub. No gallop.   Pulmonary:     Effort: Pulmonary effort is normal. No respiratory distress.     Breath sounds: No wheezing or rales.     Comments: Decreased breath sounds due to body habitus Abdominal:     General: Bowel sounds are normal. There is no distension.     Palpations: Abdomen is soft.     Tenderness: There is no abdominal tenderness. There is no guarding.  Musculoskeletal:     Right lower leg: No edema.     Left lower leg: No edema.  Skin:    General: Skin is warm.  Neurological:     General: No focal deficit present.     Mental Status: He is alert and oriented to person,  place, and time.  Psychiatric:        Mood and Affect: Mood normal.    Assessment & Plan by Problem: Active Problems:   AKI (acute kidney injury) ([place])  In summary, Mr. Heineman is a pleasant 39 year old gentleman with a past medical history significant for FSGS and membranous nephropathy, stage IV kidney disease, HTN, HLD, T2DM, asthma who presents with worsening fever, chills, body aches, decreased appetite, loss of smell and taste, abdominal discomfort and dark urine.  Patient's acute on chronic kidney disease in  addition to hypovolemia by history and exam appears to be the patient's primary problem.  #Dehydration #FSGS and Membranous Nephropathy #Acute on CKD stage IV: Baseline Cr 2.0-2.5. Pt's Cr is elevated to 10.3. Likely secondary to hypovolemia due to decreased oral intake over the last several days. S/p 2L NS in the ED. Patient takes tacrolimus 4 mg twice daily per his nephrologist.  Nephrology is following the patient, will appreciate recommendations -Appreciate nephrology recs -F/u urine sodium/creatinine -NS 200 cc/hr -HOLD home tacrolimus in setting of COVID infection.   -Tacrolimus level ordered  #COVID-19 infection: Patient is currently on room air, and does not have increased oxygen requirements. He does not require remdesivir or steroid treatment at this time. -Airborne and contact precautions -Telemetry, f/u EKG -F/u blood cultures -F/U inflammatory lab markers (ferratin, fibrinogen, LDH, CRP, D-dimer)  #HTN #HLD: Pt was hypertensive on admission to 169/105.  Patient stated he had not taken any of his medications today before coming to the hospital. He takes amlodipine 10 mg daily, carvedilol 25 mg daily, hydralazine 100 mg 3 times daily, aspirin 1 mg and atorvastatin 80 mg daily at home. -Amlodipine 10 mg daily -Carvedilol 25 mg twice daily -Clonidine 0.3 mg twice daily -Hydralazine 100 mg 3 times daily -IV Labetalol 5 mg every 4 hours as needed, SBP >220, DBP  >120 -Atorvastatin 80 mg daily -Nitroglycerin 0.3 mg as needed  #T2DM: Patient takes glargine 36 units nightly and  glipizide 5 mg daily at home. Last A1c >15 at PCP visit back in 12/06/2018.  -A1c ordered -Lantus 15 units subq nightly -SSI   #Nausea #FEN/GI -Diet: Carb modified -Fluids: NS 200 cc/hr -Famotidine 10 mg daily  #DVT prophylaxis -Heparin 5000 units subq q8hrs  #CODE STATUS: FULL  #Dispo: Admit patient to Inpatient with expected length of stay greater than 2 midnights. Prior to Admission Living Arrangement: Home Anticipated Discharge Location: Home  Barriers to Discharge: Ongoing medical management  Signed: Earlene Plater, MD Internal Medicine, PGY1 Pager: [phone]  10/08/2019,4:37 PM

## 2019-10-08 NOTE — ED Notes (Signed)
147cc urine identified from bladderscan

## 2019-10-09 ENCOUNTER — Inpatient Hospital Stay (HOSPITAL_COMMUNITY): Payer: Medicaid Other

## 2019-10-09 DIAGNOSIS — N184 Chronic kidney disease, stage 4 (severe): Secondary | ICD-10-CM | POA: Diagnosis not present

## 2019-10-09 DIAGNOSIS — E1165 Type 2 diabetes mellitus with hyperglycemia: Secondary | ICD-10-CM

## 2019-10-09 LAB — RENAL FUNCTION PANEL
Albumin: 1.7 g/dL — ABNORMAL LOW (ref 3.5–5.0)
Anion gap: 13 (ref 5–15)
BUN: 78 mg/dL — ABNORMAL HIGH (ref 6–20)
CO2: 18 mmol/L — ABNORMAL LOW (ref 22–32)
Calcium: 6.6 mg/dL — ABNORMAL LOW (ref 8.9–10.3)
Chloride: 108 mmol/L (ref 98–111)
Creatinine, Ser: 10.6 mg/dL — ABNORMAL HIGH (ref 0.61–1.24)
GFR calc Af Amer: 6 mL/min — ABNORMAL LOW (ref >60)
GFR calc non Af Amer: 5 mL/min — ABNORMAL LOW (ref >60)
Glucose, Bld: 208 mg/dL — ABNORMAL HIGH (ref 70–99)
Phosphorus: 5.6 mg/dL — ABNORMAL HIGH (ref 2.5–4.6)
Potassium: 4.4 mmol/L (ref 3.5–5.1)
Sodium: 139 mmol/L (ref 135–145)

## 2019-10-09 LAB — GLUCOSE, CAPILLARY
Glucose-Capillary: 166 mg/dL — ABNORMAL HIGH (ref 70–99)
Glucose-Capillary: 168 mg/dL — ABNORMAL HIGH (ref 70–99)
Glucose-Capillary: 142 mg/dL — ABNORMAL HIGH (ref 70–99)
Glucose-Capillary: 140 mg/dL — ABNORMAL HIGH (ref 70–99)

## 2019-10-09 LAB — C-REACTIVE PROTEIN: CRP: 8.3 mg/dL — ABNORMAL HIGH (ref <1.0)

## 2019-10-09 LAB — D-DIMER, QUANTITATIVE: D-Dimer, Quant: 4.59 ug/mL-FEU — ABNORMAL HIGH (ref 0.00–0.50)

## 2019-10-09 MED ORDER — ONDANSETRON 4 MG PO TBDP
8.0000 mg | ORAL_TABLET | Freq: Three times a day (TID) | ORAL | Status: DC | PRN
  Administered 2019-10-09 – 2019-10-17 (×4): 8 mg via ORAL
  Filled 2019-10-09 (×4): qty 2

## 2019-10-09 MED ORDER — ONDANSETRON HCL 4 MG/2ML IJ SOLN
4.0000 mg | Freq: Once | INTRAMUSCULAR | Status: AC
  Administered 2019-10-09: 19:00:00 4 mg via INTRAVENOUS

## 2019-10-09 MED ORDER — POLYETHYLENE GLYCOL 3350 17 G PO PACK
17.0000 g | PACK | Freq: Every day | ORAL | Status: DC
  Administered 2019-10-09 – 2019-10-12 (×4): 17 g via ORAL
  Filled 2019-10-09 (×4): qty 1

## 2019-10-09 MED ORDER — ONDANSETRON HCL 4 MG PO TABS
8.0000 mg | ORAL_TABLET | Freq: Once | ORAL | Status: AC
  Administered 2019-10-09: 09:00:00 8 mg via ORAL
  Filled 2019-10-09: qty 2

## 2019-10-09 MED ORDER — ALUM & MAG HYDROXIDE-SIMETH 200-200-20 MG/5ML PO SUSP
30.0000 mL | Freq: Once | ORAL | Status: AC
  Administered 2019-10-09: 22:00:00 30 mL via ORAL
  Filled 2019-10-09: qty 30

## 2019-10-09 MED ORDER — LACTATED RINGERS IV SOLN
INTRAVENOUS | Status: DC
  Administered 2019-10-09 – 2019-10-11 (×4): 1000 mL via INTRAVENOUS

## 2019-10-09 MED ORDER — HYDRALAZINE HCL 50 MG PO TABS
50.0000 mg | ORAL_TABLET | Freq: Three times a day (TID) | ORAL | Status: DC
  Administered 2019-10-09 – 2019-10-13 (×13): 50 mg via ORAL
  Filled 2019-10-09 (×13): qty 1

## 2019-10-09 MED ORDER — LIDOCAINE VISCOUS HCL 2 % MT SOLN
15.0000 mL | Freq: Once | OROMUCOSAL | Status: AC
  Administered 2019-10-09: 22:00:00 15 mL via ORAL
  Filled 2019-10-09: qty 15

## 2019-10-09 MED ORDER — ACETAMINOPHEN 325 MG PO TABS
650.0000 mg | ORAL_TABLET | Freq: Four times a day (QID) | ORAL | Status: DC | PRN
  Administered 2019-10-09 – 2019-10-17 (×13): 650 mg via ORAL
  Filled 2019-10-09 (×13): qty 2

## 2019-10-09 NOTE — Plan of Care (Signed)

## 2019-10-09 NOTE — Significant Event (Addendum)
Paged IMTS on-call via [place] to review AM lab results and consult w/ RN. Cr worsened after NS@150 /hr per order. No V/D overnight. Nausea, but easily corrected w/ GI cocktail given earlier in shift. BP has greatly improved and is now WNL. UOtpt was reported. 350cc emptied via RN from urinal and 1x unmeasured occurrence when pt walked to BR. Pt reports no further c/o pain at this time.  -Will relay labs to dayshift RN and remind pt/team of need for Strict I/O monitoring.

## 2019-10-09 NOTE — Progress Notes (Signed)
     Subjective: Denies shortness of breath, denies headache overall just reports "I want my kidney numbers to get better"  Objective:  Vital signs in last 24 hours: Vitals:   10/08/19 2218 10/09/19 0436 10/09/19 0609 10/09/19 0859  BP: (!) 163/94  126/76 (!) 153/101  Pulse:  75 73   Resp:   19   Temp:   98.5 F (36.9 C)   TempSrc:   Oral   SpO2:  95% 95%   Weight:  134.2 kg    Height:      General: resting in bed, NAD HEENT: EOMI, no scleral icterus Cardiac: RRR, no rubs, murmurs or gallops, no JVD Pulm: clear to auscultation bilaterally, moving normal volumes of air Abd: obese, soft, nontender, nondistended, BS present Ext: warm and well perfused, trace bilateral pedal edema Neuro:  cranial nerves II-XII grossly intact, no asterxis    Assessment/Plan:   AKI (acute kidney injury) ([place]) 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  By my exam overall appears euvolemic creatinine possibly plateauing but not yet improving.  Bicarb stable at 18.  Renal US without signs of any obstruction. -Avoid nephrotoxic medications, continue supportive care and monitoring volume status.  Mild-moderate COVID-19 -O2 saturations excellent during my examination overall trend appears to be good no increased work of breathing.  I do not think there is a role for remdesivir or Decadron for this patient.  Hypertension -Want to avoid hypotension per nephrology recommendations with his AKI.  He is mildly hypertensive today will have ongoing monitoring and medication adjustment  T2DM with hyperglycemia -currently at inpatient goal -lantus 15 units - SSI-M  Dispo:  Patient coming from: Home Discharge location: Home Barriers: Ongoing medical management of AKI  Lucious Groves, DO 10/09/2019, 12:51 PM

## 2019-10-09 NOTE — Progress Notes (Signed)
[facility] Progress Note  Subjective: having some nausea, mild fatigue, able to eat  Vitals:   10/09/19 0436 10/09/19 0609 10/09/19 0859 10/09/19 1346  BP:  126/76 (!) 153/101 124/79  Pulse: 75 73  72  Resp:  19  18  Temp:  98.5 F (36.9 C)  98.7 F (37.1 C)  TempSrc:  Oral  Oral  SpO2: 95% 95%  96%  Weight: 134.2 kg     Height:        Exam: Gen tall big-framed AAM no distress, calm No jvd Chest clear bilat to bases RRR no MRG Abd soft ntnd no mass or ascites +bs GU normal male MS no joint effusions or deformity Ext no LE or UE edema, no wounds or ulcers Neuro is alert, Ox 3 , nf , no asterixis    Home meds:  - furosemide 80 bid/ amlodipine 10/ carvedilol 25 bid/ clonidine 0.3 bid/ hydralazine 100 tid  - tacrolimus 4mg  bid  - sl ntg prn/ atrovastatin 80/ aspirin 81  - insulin glargine 36u hs/ insulin novolog 15u tid/ glipizide 5mg  qd  - pepcid 10 prn/ kdur 20   UA 1/30 nazy , prot > 300, few bact, 6-10 rbc, no wbc   CXR 1/30 LLL hazy ill defined infiltrate, poss pna    Renal US > 14 cm kidneys w/ ^'d echo, no hydro   UNa 31, UCr 107     Assessment/ Plan: 1. AoCKD3 - baseline creat 2.5 f/b Dr Lorrene Reid.  In patient w/ COVID+ infection and nausea/ vomiting/ diarrhea. Suspect AKI related to dehydration. UA is proteinuria, renal US unremarkable, urine lytes c/w vol depletion.  Was very dehydrated on admission, got 3L IVF yest and made some urine, creat unchanged. Mild uremic sx's, no strong indication for RRT yet. Continue IVF's 150/hr, will rebolus 1 L . F/u labs in am. Avoid overtreating HTN, keep systolic pressures over [id number].  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 HTN - BP high on admit, down w/ po meds, keep SBP > 120 in chronic htn pt w/ AKI.  4. COVID+ infection - per primary 5. DM 2 - on insulin      Rob Vernia Teem 10/09/2019, 2:57 PM  Inpatient medications: . amLODipine  10 mg Oral QHS  . atorvastatin  80  mg Oral Daily  . carvedilol  25 mg Oral BID WC  . cloNIDine  0.3 mg Oral BID  . famotidine  10 mg Oral Daily  . heparin  5,000 Units Subcutaneous Q8H  . hydrALAZINE  100 mg Oral TID  . insulin aspart  0-15 Units Subcutaneous TID WC  . insulin glargine  15 Units Subcutaneous QHS   . lactated ringers 150 mL/hr at 10/09/19 0852   acetaminophen, labetalol, nitroGLYCERIN Recent Labs  Lab 10/08/19 1056 10/09/19 0341  NA 138 139  K 4.4 4.4  CL 104 108  CO2 17* 18*  GLUCOSE 220* 208*  BUN 72* 78*  CREATININE 10.30* 10.60*  CALCIUM 7.3* 6.6*  PHOS  --  5.6*

## 2019-10-10 LAB — RENAL FUNCTION PANEL
Albumin: 1.6 g/dL — ABNORMAL LOW (ref 3.5–5.0)
Anion gap: 16 — ABNORMAL HIGH (ref 5–15)
BUN: 82 mg/dL — ABNORMAL HIGH (ref 6–20)
CO2: 17 mmol/L — ABNORMAL LOW (ref 22–32)
Calcium: 6.9 mg/dL — ABNORMAL LOW (ref 8.9–10.3)
Chloride: 106 mmol/L (ref 98–111)
Creatinine, Ser: 11.54 mg/dL — ABNORMAL HIGH (ref 0.61–1.24)
GFR calc Af Amer: 6 mL/min — ABNORMAL LOW (ref >60)
GFR calc non Af Amer: 5 mL/min — ABNORMAL LOW (ref >60)
Glucose, Bld: 151 mg/dL — ABNORMAL HIGH (ref 70–99)
Phosphorus: 5.8 mg/dL — ABNORMAL HIGH (ref 2.5–4.6)
Potassium: 4.5 mmol/L (ref 3.5–5.1)
Sodium: 139 mmol/L (ref 135–145)

## 2019-10-10 LAB — GLUCOSE, CAPILLARY
Glucose-Capillary: 148 mg/dL — ABNORMAL HIGH (ref 70–99)
Glucose-Capillary: 241 mg/dL — ABNORMAL HIGH (ref 70–99)
Glucose-Capillary: 296 mg/dL — ABNORMAL HIGH (ref 70–99)
Glucose-Capillary: 194 mg/dL — ABNORMAL HIGH (ref 70–99)

## 2019-10-10 LAB — D-DIMER, QUANTITATIVE: D-Dimer, Quant: 5.6 ug/mL-FEU — ABNORMAL HIGH (ref 0.00–0.50)

## 2019-10-10 LAB — C-REACTIVE PROTEIN: CRP: 6.9 mg/dL — ABNORMAL HIGH (ref <1.0)

## 2019-10-10 MED ORDER — SENNOSIDES-DOCUSATE SODIUM 8.6-50 MG PO TABS
1.0000 | ORAL_TABLET | Freq: Every evening | ORAL | Status: DC | PRN
  Filled 2019-10-10: qty 1

## 2019-10-10 NOTE — Progress Notes (Signed)
Patient was complaining of not having bowel movement since 01/28. He has schedule Miralax but is not helping him. MD made aware. Will continue to monitor.

## 2019-10-10 NOTE — Progress Notes (Signed)
[facility] Progress Note    Assessment/ Plan:   1. AoCKD3 - baseline creat 2.5 f/b Dr Lorrene Reid.  In patient w/ COVID+ infection and nausea/ vomiting/ diarrhea. Suspect AKI related to dehydration. UA is proteinuria, renal US unremarkable, urine lytes c/w vol depletion.  Was very dehydrated on admission.  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  Previously on tacrolimus until this most recent insult.  Discussed possibility of needing HD--> will keep NPO past MN, continue to hydrate, and order HD cath in AM if needed.  Fortunately doesn't have real uremic symptoms and nausea improved. 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 HTN - BP high on admit, down w/ po meds, keep SBP > 120 in chronic htn pt w/ AKI.  4. COVID+ infection - per primary 5. DM 2 - on insulin  Subjective:    Seen in room.  Feeling relatively OK, some more UOP today but not charted.  Cr climbing still, no real symptoms of uremia.     Objective:   BP 133/84   Pulse 72   Temp 98.7 F (37.1 C)   Resp (!) 21   Ht 6\' 5"  (1.956 m)   Wt (!) 139.5 kg   SpO2 95%   BMI 36.47 kg/m   Intake/Output Summary (Last 24 hours) at 10/10/2019 1655 Last data filed at 10/10/2019 1600 Gross per 24 hour  Intake 2526.62 ml  Output 1000 ml  Net 1526.62 ml   Weight change: 17 kg  Physical Exam: Gen: NAD, sitting in bed CVS: RRR Resp: clear bilaterally no c/w/r Abd: soft, bloated  Ext: no LE edema  Imaging: US RENAL  Result Date: 10/09/2019 CLINICAL DATA:  Acute renal failure. EXAM: RENAL / URINARY TRACT ULTRASOUND COMPLETE COMPARISON:  Renal ultrasound June 02, 2016 FINDINGS: Right Kidney: Renal measurements: 13.9 x 7.5 x 8.4 cm = volume: 458.1 mL. Mildly increased in echogenicity. No hydronephrosis. Left Kidney: Renal measurements: 14.3 x 7.4 x 6.9 cm = volume: 382.6 mL. Mildly increased in echogenicity. There is a 3.6 cm cyst within the midpole of  the left kidney. Bladder: Appears normal for degree of bladder distention. Other: Small amount of fluid identified within the pelvis. IMPRESSION: No hydronephrosis. Bilateral renal enlargement. Small amount of fluid identified within the pelvis posterior to the urinary bladder, nonspecific on current exam. Electronically Signed   By: Lovey Newcomer M.D.   On: 10/09/2019 10:25    Labs: BMET Recent Labs  Lab 10/08/19 1056 10/09/19 0341 10/10/19 0438  NA 138 139 139  K 4.4 4.4 4.5  CL 104 108 106  CO2 17* 18* 17*  GLUCOSE 220* 208* 151*  BUN 72* 78* 82*  CREATININE 10.30* 10.60* 11.54*  CALCIUM 7.3* 6.6* 6.9*  PHOS  --  5.6* 5.8*   CBC Recent Labs  Lab 10/08/19 1056  WBC 8.2  NEUTROABS 6.7  HGB 12.9*  HCT 40.5  MCV 80.5  PLT 180    Medications:    . amLODipine  10 mg Oral QHS  . atorvastatin  80 mg Oral Daily  . carvedilol  25 mg Oral BID WC  . cloNIDine  0.3 mg Oral BID  . famotidine  10 mg Oral Daily  . heparin  5,000 Units Subcutaneous Q8H  . hydrALAZINE  50 mg Oral TID  . insulin aspart  0-15 Units Subcutaneous TID WC  . insulin glargine  15 Units Subcutaneous QHS  .  polyethylene glycol  17 g Oral Daily      Madelon Lips, MD 10/10/2019, 4:55 PM

## 2019-10-10 NOTE — Progress Notes (Signed)
   Subjective: Patient denies shortness of breath ,satting well with supplemental oxygen off.  Having abdominal pain.  Reports no bowel movement and 5 days. Reports one episode of emesis yesterday. No nausea currently.   Objective:  Vital signs in last 24 hours: Vitals:   10/09/19 2002 10/10/19 0453 10/10/19 0502 10/10/19 0800  BP: 140/90 (!) 149/96  (!) 146/90  Pulse: 76 71  75  Resp: 19 (!) 21  (!) 21  Temp: 99.1 F (37.3 C) 98.7 F (37.1 C)  97.8 F (36.6 C)  TempSrc: Oral Oral  Oral  SpO2: 93% 96%  97%  Weight:   (!) 139.5 kg   Height:       General: Obese man, NAD HEENT: EOMI, no scleral icterus Cardiovascular: RRR, no murmurs , rubs , or gallops Pulmonary : CTAB, good air movement Abdominal: soft, no focal tenderness, BS present    Assessment/Plan:  Active Problems:   AKI (acute kidney injury) ([facility])  [name] 39 year old man with a past medical history FSGS and membranous nephropathy, CKD stage 3 , HTN, HLD, type 2 diabetes, and asthma who presented 6 days into COVID -19 infection. Admission complicated by acute on chronic renal disease.   #AKI on CKD stage 3  -Followed by Dr.Dunn for membranous nephropathy/FSGS. - Baseline Cr ~ 2-2.5, Cr elevated to 10 on admission,  GFR 6 . Bicarb stable ~17 - Net +5 L on admission, 1 L out yesterday.  P: - Continue holding home prograf, takes for [facility] - Nephrology following, greatly appreciate their recommendation. - Continue IV fluids , LR @ 150cc/hr  #Covid-19 Off supplemental oxygen, improved symptoms. Non focal GI discomfort.   #HTN Mild hypertension, hold home meds Norvasc 10 mg , Coreg 25 mg twice daily, clonidine 0.3 mg twice daily, hydralazine 100 mg 3 times daily P: Hold antihypertensives per nephrology recommendations  #T2DM At inpatient goal.  Appears uncontrolled as outpatient , at least 2018-2020 per A1C's.   P: - Continue Lantus 15 units - SSI - M  Prior to Admission Living Arrangement: Home  Anticipated Discharge Location: Home Barriers to Discharge: Clinical improvement Dispo: Anticipated discharge with clinical improvement  [name], MD PGY1

## 2019-10-11 ENCOUNTER — Inpatient Hospital Stay (HOSPITAL_COMMUNITY): Payer: Medicaid Other

## 2019-10-11 HISTORY — PX: IR FLUORO GUIDE CV LINE RIGHT: IMG2283

## 2019-10-11 HISTORY — PX: IR US GUIDE VASC ACCESS RIGHT: IMG2390

## 2019-10-11 LAB — RENAL FUNCTION PANEL
Albumin: 1.6 g/dL — ABNORMAL LOW (ref 3.5–5.0)
Anion gap: 13 (ref 5–15)
BUN: 88 mg/dL — ABNORMAL HIGH (ref 6–20)
CO2: 19 mmol/L — ABNORMAL LOW (ref 22–32)
Calcium: 7 mg/dL — ABNORMAL LOW (ref 8.9–10.3)
Chloride: 108 mmol/L (ref 98–111)
Creatinine, Ser: 12.14 mg/dL — ABNORMAL HIGH (ref 0.61–1.24)
GFR calc Af Amer: 5 mL/min — ABNORMAL LOW (ref >60)
GFR calc non Af Amer: 5 mL/min — ABNORMAL LOW (ref >60)
Glucose, Bld: 150 mg/dL — ABNORMAL HIGH (ref 70–99)
Phosphorus: 4.8 mg/dL — ABNORMAL HIGH (ref 2.5–4.6)
Potassium: 4.5 mmol/L (ref 3.5–5.1)
Sodium: 140 mmol/L (ref 135–145)

## 2019-10-11 LAB — D-DIMER, QUANTITATIVE: D-Dimer, Quant: 8.72 ug/mL-FEU — ABNORMAL HIGH (ref 0.00–0.50)

## 2019-10-11 LAB — C-REACTIVE PROTEIN: CRP: 5.6 mg/dL — ABNORMAL HIGH (ref <1.0)

## 2019-10-11 LAB — GLUCOSE, CAPILLARY
Glucose-Capillary: 121 mg/dL — ABNORMAL HIGH (ref 70–99)
Glucose-Capillary: 99 mg/dL (ref 70–99)
Glucose-Capillary: 123 mg/dL — ABNORMAL HIGH (ref 70–99)
Glucose-Capillary: 141 mg/dL — ABNORMAL HIGH (ref 70–99)

## 2019-10-11 MED ORDER — FENTANYL CITRATE (PF) 100 MCG/2ML IJ SOLN
INTRAMUSCULAR | Status: AC
  Filled 2019-10-11: qty 4

## 2019-10-11 MED ORDER — HYDROMORPHONE HCL 1 MG/ML IJ SOLN
0.5000 mg | Freq: Once | INTRAMUSCULAR | Status: AC
  Administered 2019-10-11: 22:00:00 0.5 mg via INTRAVENOUS
  Filled 2019-10-11: qty 0.5

## 2019-10-11 MED ORDER — CHLORHEXIDINE GLUCONATE CLOTH 2 % EX PADS
6.0000 | MEDICATED_PAD | Freq: Every day | CUTANEOUS | Status: DC
  Administered 2019-10-12 – 2019-10-16 (×4): 6 via TOPICAL

## 2019-10-11 MED ORDER — MIDAZOLAM HCL 2 MG/2ML IJ SOLN
INTRAMUSCULAR | Status: AC | PRN
  Administered 2019-10-11 (×3): 1 mg via INTRAVENOUS

## 2019-10-11 MED ORDER — LIDOCAINE HCL (PF) 1 % IJ SOLN: 5.0000 mL | INTRAMUSCULAR | Status: DC | PRN

## 2019-10-11 MED ORDER — GELATIN ABSORBABLE 12-7 MM EX MISC
CUTANEOUS | Status: AC
  Filled 2019-10-11: qty 1

## 2019-10-11 MED ORDER — MIDAZOLAM HCL 2 MG/2ML IJ SOLN
INTRAMUSCULAR | Status: AC
  Filled 2019-10-11: qty 4

## 2019-10-11 MED ORDER — CEFAZOLIN SODIUM-DEXTROSE 2-4 GM/100ML-% IV SOLN
2.0000 g | Freq: Once | INTRAVENOUS | Status: AC
  Administered 2019-10-11: 2 g via INTRAVENOUS
  Filled 2019-10-11: qty 100

## 2019-10-11 MED ORDER — LIDOCAINE-PRILOCAINE 2.5-2.5 % EX CREA: 1.0000 "application " | TOPICAL_CREAM | CUTANEOUS | Status: DC | PRN

## 2019-10-11 MED ORDER — HEPARIN SODIUM (PORCINE) 1000 UNIT/ML IJ SOLN
INTRAMUSCULAR | Status: AC | PRN
  Administered 2019-10-11: 3800 [IU] via INTRAVENOUS

## 2019-10-11 MED ORDER — HEPARIN SODIUM (PORCINE) 1000 UNIT/ML DIALYSIS: 1000.0000 [IU] | INTRAMUSCULAR | Status: DC | PRN

## 2019-10-11 MED ORDER — HEPARIN SODIUM (PORCINE) 5000 UNIT/ML IJ SOLN
5000.0000 [IU] | Freq: Three times a day (TID) | INTRAMUSCULAR | Status: DC
  Administered 2019-10-12 – 2019-10-17 (×16): 5000 [IU] via SUBCUTANEOUS
  Filled 2019-10-11 (×16): qty 1

## 2019-10-11 MED ORDER — SODIUM CHLORIDE 0.9 % IV SOLN: 100.0000 mL | INTRAVENOUS | Status: DC | PRN

## 2019-10-11 MED ORDER — CEFAZOLIN SODIUM-DEXTROSE 2-4 GM/100ML-% IV SOLN
INTRAVENOUS | Status: AC
  Filled 2019-10-11: qty 100

## 2019-10-11 MED ORDER — CHLORHEXIDINE GLUCONATE CLOTH 2 % EX PADS: 6.0000 | MEDICATED_PAD | Freq: Every day | CUTANEOUS | Status: DC

## 2019-10-11 MED ORDER — PENTAFLUOROPROP-TETRAFLUOROETH EX AERO: 1.0000 "application " | INHALATION_SPRAY | CUTANEOUS | Status: DC | PRN

## 2019-10-11 MED ORDER — HEPARIN SODIUM (PORCINE) 1000 UNIT/ML IJ SOLN
INTRAMUSCULAR | Status: AC
  Filled 2019-10-11: qty 1

## 2019-10-11 MED ORDER — FENTANYL CITRATE (PF) 100 MCG/2ML IJ SOLN
INTRAMUSCULAR | Status: AC | PRN
  Administered 2019-10-11 (×3): 50 ug via INTRAVENOUS

## 2019-10-11 MED ORDER — CHLORHEXIDINE GLUCONATE CLOTH 2 % EX PADS
6.0000 | MEDICATED_PAD | Freq: Every day | CUTANEOUS | Status: DC
  Administered 2019-10-11: 22:00:00 6 via TOPICAL

## 2019-10-11 MED ORDER — LIDOCAINE HCL 1 % IJ SOLN
INTRAMUSCULAR | Status: AC | PRN
  Administered 2019-10-11: 10 mL

## 2019-10-11 MED ORDER — LIDOCAINE HCL 1 % IJ SOLN
INTRAMUSCULAR | Status: AC
  Filled 2019-10-11: qty 20

## 2019-10-11 NOTE — Progress Notes (Signed)
Patient going to IR .

## 2019-10-11 NOTE — Progress Notes (Signed)
Patient back from IR. Alert and oriented, complaining of nausea. BP elevated (171/101). Patient received Hydralazine 50 mg po, scheduled dose for elevated BP and Zofran 8 mg po for nausea. HD cath on right upper chest - dressing clean, dry, intact. Patient denied any pain. Will continue to monitor.

## 2019-10-11 NOTE — H&P (Signed)
Patient Status: [facility] - Out-pt  Assessment and Plan:  39 y/o F currently admitted with AKI on CKD III who was seen by nephrology who plans to begin HD - IR has been asked to place a tunneled HD catheter today.  Patient has been NPO since midnight, last dose SQ heparin 0600 today, creatinine 12.14.  Risks and benefits discussed with the patient including, but not limited to bleeding, infection, vascular injury, pneumothorax which may require chest tube placement, air embolism or even death.  All of the patient's questions were answered, patient is agreeable to proceed.  Verbal consent obtained due to Covid (+) status and placed in IR binder.  ______________________________________________________________________   History of Present Illness: Jeremy [name] is a 39 y.o. male with past medical history significant for HTN, DM, FSGS, CKD III and Covid (+) who presented to the ED on 1/30 with complaints of fever, chills, body aches, poor appetite, loss of taste/smell, abdominal pain and dark urine after recent covid diagnosis. He was noted to have a creatinine of 10.3 (baseline ~2.0) and was admitted for further evaluation and management of AKI on CKD. He was seen by nephrology who plans to initiate HD - IR has been consulted for tunneled HD catheter placement.  Patient seen in his room, he is very upset that he is not able to eat as he was told his procedure would occur this morning. He is reluctant to begin HD but is agreeable to proceed and have HD catheter placed today.   Allergies and medications reviewed.   Review of Systems: A 12 point ROS discussed and pertinent positives are indicated in the HPI above.  All other systems are negative.  Review of Systems  Constitutional: Positive for fatigue. Negative for chills and fever.  Respiratory: Positive for shortness of breath ("sometimes when I get up"). Negative for cough.   Cardiovascular: Negative for chest pain.  Gastrointestinal:  Negative for abdominal pain, diarrhea, nausea and vomiting.  Neurological: Negative for dizziness and headaches.    Vital Signs: BP (!) 133/99 (BP Location: Right Arm)   Pulse 72   Temp 98 F (36.7 C) (Oral)   Resp (!) 21   Ht 6\' 5"  (1.956 m)   Wt 298 lb 8.1 oz (135.4 kg)   SpO2 94%   BMI 35.40 kg/m   Physical Exam Vitals and nursing note reviewed.  Constitutional:      General: He is not in acute distress. HENT:     Head: Normocephalic.     Mouth/Throat:     Mouth: Mucous membranes are moist.     Pharynx: Oropharynx is clear. No oropharyngeal exudate or posterior oropharyngeal erythema.  Cardiovascular:     Rate and Rhythm: Normal rate and regular rhythm.  Pulmonary:     Effort: Pulmonary effort is normal.     Breath sounds: Normal breath sounds.  Abdominal:     General: There is no distension.     Palpations: Abdomen is soft.     Tenderness: There is no abdominal tenderness.  Skin:    General: Skin is warm and dry.  Neurological:     Mental Status: He is alert and oriented to person, place, and time.  Psychiatric:        Mood and Affect: Mood normal.        Behavior: Behavior normal.        Thought Content: Thought content normal.        Judgment: Judgment normal.      Imaging  reviewed.   Labs:  COAGS: No results for input(s): INR, APTT in the last 8760 hours.  BMP: Recent Labs    10/08/19 1056 10/09/19 0341 10/10/19 0438 10/11/19 0313  NA 138 139 139 140  K 4.4 4.4 4.5 4.5  CL 104 108 106 108  CO2 17* 18* 17* 19*  GLUCOSE 220* 208* 151* 150*  BUN 72* 78* 82* 88*  CALCIUM 7.3* 6.6* 6.9* 7.0*  CREATININE 10.30* 10.60* 11.54* 12.14*  GFRNONAA 6* 5* 5* 5*  GFRAA 7* 6* 6* 5*       Electronically Signed: Joaquim Nam, PA-C 10/11/2019, 11:54 AM   I spent a total of 15 minutes in face to face in clinical consultation, greater than 50% of which was counseling/coordinating care for venous access.

## 2019-10-11 NOTE — Procedures (Signed)
Interventional Radiology Procedure:   Indications: Acute on chronic renal failure  Procedure: Tunneled HD catheter placement  Findings: Right jugular Palindrome (23 cm), tip in RA  Complications: None     EBL: less than 10 ml  Plan: Catheter is ready to use.     Emmersyn Kratzke R. Anselm Pancoast, MD  Pager: [phone]

## 2019-10-11 NOTE — Progress Notes (Signed)
[facility] Progress Note    Assessment/ Plan:   1. AoCKD3 - baseline creat 2.5 f/b Dr Lorrene Reid.  In patient w/ COVID+ infection and nausea/ vomiting/ diarrhea. Suspect AKI related to dehydration. UA is proteinuria, renal US unremarkable, urine lytes c/w vol depletion.  Was very dehydrated on admission.  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  Previously on tacrolimus until this most recent insult.  S/p TDC, will do HD #1 in AM.  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 HTN - BP high on admit, down w/ po meds, keep SBP > 120 in chronic htn pt w/ AKI.  4. COVID+ infection - per primary 5. DM 2 - on insulin  Subjective:    Starting to feel nauseated.  TDC placed today.  For HD #1    Objective:   BP (!) 171/101   Pulse 71   Temp 98.2 F (36.8 C)   Resp (!) 21   Ht 6\' 5"  (1.956 m)   Wt 135.4 kg   SpO2 96%   BMI 35.40 kg/m   Intake/Output Summary (Last 24 hours) at 10/11/2019 1601 Last data filed at 10/11/2019 1530 Gross per 24 hour  Intake 1870.09 ml  Output 150 ml  Net 1720.09 ml   Weight change: -4.1 kg  Physical Exam: Gen: NAD, lying in bed CVS: RRR Resp: clear bilaterally no c/w/r Abd: soft, bloated  Ext: no LE edema  Imaging: IR Fluoro Guide CV Line Right  Result Date: 10/11/2019 INDICATION: 39 year old with acute kidney injury. Catheter needed for hemodialysis. EXAM: FLUOROSCOPIC AND ULTRASOUND GUIDED PLACEMENT OF A TUNNELED DIALYSIS CATHETER Physician: Stephan Minister. Anselm Pancoast, MD MEDICATIONS: Ancef 2 g; The antibiotic was administered within an appropriate time interval prior to skin puncture. ANESTHESIA/SEDATION: Versed 3.0 mg IV; Fentanyl 150 mcg IV; Moderate Sedation Time:  24 minutes The patient was continuously monitored during the procedure by the interventional radiology nurse under my direct supervision. FLUOROSCOPY TIME:  Fluoroscopy Time: 24 seconds, 4 mGy COMPLICATIONS: None  immediate. PROCEDURE: The procedure was explained to the patient. The risks and benefits of the procedure were discussed and the patient's questions were addressed. Informed consent was obtained from the patient. The patient was placed supine on the interventional table. Ultrasound confirmed a patent right internal jugular vein. Ultrasound images were obtained for documentation. The right neck and chest was prepped and draped in a sterile fashion. The right neck was anesthetized with 1% lidocaine. Maximal barrier sterile technique was utilized including caps, mask, sterile gowns, sterile gloves, sterile drape, hand hygiene and skin antiseptic. A small incision was made with #11 blade scalpel. A 21 gauge needle directed into the right internal jugular vein with ultrasound guidance. A micropuncture dilator set was placed. A 23 cm tip to cuff Palindrome catheter was selected. The skin below the right clavicle was anesthetized and a small incision was made with an #11 blade scalpel. A subcutaneous tunnel was formed to the vein dermatotomy site. The catheter was brought through the tunnel. The vein dermatotomy site was dilated to accommodate a peel-away sheath. The catheter was placed through the peel-away sheath and directed into the central venous structures. The tip of the catheter was placed in the right atrium with fluoroscopy. Fluoroscopic images were obtained for documentation. Both lumens were found to aspirate and flush well. The proper amount of heparin was flushed in both lumens. The vein dermatotomy site was closed  using a single layer of absorbable suture and Dermabond. Gel-Foam placed in subcutaneous tract. The catheter was secured to the skin using Prolene suture. IMPRESSION: Successful placement of a right jugular tunneled dialysis catheter using ultrasound and fluoroscopic guidance. Electronically Signed   By: Markus Daft M.D.   On: 10/11/2019 15:24   IR US Guide Vasc Access Right  Result Date:  10/11/2019 INDICATION: 39 year old with acute kidney injury. Catheter needed for hemodialysis. EXAM: FLUOROSCOPIC AND ULTRASOUND GUIDED PLACEMENT OF A TUNNELED DIALYSIS CATHETER Physician: Stephan Minister. Anselm Pancoast, MD MEDICATIONS: Ancef 2 g; The antibiotic was administered within an appropriate time interval prior to skin puncture. ANESTHESIA/SEDATION: Versed 3.0 mg IV; Fentanyl 150 mcg IV; Moderate Sedation Time:  24 minutes The patient was continuously monitored during the procedure by the interventional radiology nurse under my direct supervision. FLUOROSCOPY TIME:  Fluoroscopy Time: 24 seconds, 4 mGy COMPLICATIONS: None immediate. PROCEDURE: The procedure was explained to the patient. The risks and benefits of the procedure were discussed and the patient's questions were addressed. Informed consent was obtained from the patient. The patient was placed supine on the interventional table. Ultrasound confirmed a patent right internal jugular vein. Ultrasound images were obtained for documentation. The right neck and chest was prepped and draped in a sterile fashion. The right neck was anesthetized with 1% lidocaine. Maximal barrier sterile technique was utilized including caps, mask, sterile gowns, sterile gloves, sterile drape, hand hygiene and skin antiseptic. A small incision was made with #11 blade scalpel. A 21 gauge needle directed into the right internal jugular vein with ultrasound guidance. A micropuncture dilator set was placed. A 23 cm tip to cuff Palindrome catheter was selected. The skin below the right clavicle was anesthetized and a small incision was made with an #11 blade scalpel. A subcutaneous tunnel was formed to the vein dermatotomy site. The catheter was brought through the tunnel. The vein dermatotomy site was dilated to accommodate a peel-away sheath. The catheter was placed through the peel-away sheath and directed into the central venous structures. The tip of the catheter was placed in the right  atrium with fluoroscopy. Fluoroscopic images were obtained for documentation. Both lumens were found to aspirate and flush well. The proper amount of heparin was flushed in both lumens. The vein dermatotomy site was closed using a single layer of absorbable suture and Dermabond. Gel-Foam placed in subcutaneous tract. The catheter was secured to the skin using Prolene suture. IMPRESSION: Successful placement of a right jugular tunneled dialysis catheter using ultrasound and fluoroscopic guidance. Electronically Signed   By: Markus Daft M.D.   On: 10/11/2019 15:24    Labs: BMET Recent Labs  Lab 10/08/19 1056 10/09/19 0341 10/10/19 0438 10/11/19 0313  NA 138 139 139 140  K 4.4 4.4 4.5 4.5  CL 104 108 106 108  CO2 17* 18* 17* 19*  GLUCOSE 220* 208* 151* 150*  BUN 72* 78* 82* 88*  CREATININE 10.30* 10.60* 11.54* 12.14*  CALCIUM 7.3* 6.6* 6.9* 7.0*  PHOS  --  5.6* 5.8* 4.8*   CBC Recent Labs  Lab 10/08/19 1056  WBC 8.2  NEUTROABS 6.7  HGB 12.9*  HCT 40.5  MCV 80.5  PLT 180    Medications:    . amLODipine  10 mg Oral QHS  . atorvastatin  80 mg Oral Daily  . carvedilol  25 mg Oral BID WC  . Chlorhexidine Gluconate Cloth  6 each Topical Q0600  . cloNIDine  0.3 mg Oral BID  . famotidine  10  mg Oral Daily  . fentaNYL      . gelatin adsorbable      . heparin      . [START ON 10/12/2019] heparin  5,000 Units Subcutaneous Q8H  . hydrALAZINE  50 mg Oral TID  . insulin aspart  0-15 Units Subcutaneous TID WC  . insulin glargine  15 Units Subcutaneous QHS  . lidocaine      . midazolam      . polyethylene glycol  17 g Oral Daily      Madelon Lips, MD 10/11/2019, 4:01 PM

## 2019-10-11 NOTE — Progress Notes (Signed)
   Subjective: Patient reports he is feeling well overall this morning. Aware of plan for HD cath placement for HD. Reports A little nausea this morning , but it has gone away.   Objective:  Vital signs in last 24 hours: Vitals:   10/10/19 2000 10/11/19 0000 10/11/19 0500 10/11/19 0516  BP: (!) 159/95 135/81  (!) 151/91  Pulse:    69  Resp:    20  Temp: 98.7 F (37.1 C) 98.7 F (37.1 C)  98.7 F (37.1 C)  TempSrc: Oral Oral  Oral  SpO2:    95%  Weight:   135.4 kg   Height:       General: Obese man, NAD HEENT: EOMI, no scleral icterus Cardiovascular: RRR, no murmurs , rubs , or gallops Pulmonary : CTAB, good air movement Abdominal: soft, no focal tenderness, BS present MSK: no deformities, edema in feet   Assessment/Plan:  Active Problems:   AKI (acute kidney injury) (HCC)  [name] 39 year old man with a past medical history FSGS and membranous nephropathy, CKD stage 3 , HTN, HLD, type 2 diabetes, and asthma who presented 6 days into COVID -19 infection. Admission complicated by acute on chronic renal disease.   #AKI on CKD stage 3  -Followed by Dr.Dunn for membranous nephropathy/FSGS.  - Baseline Cr ~ 2-2.5, Cr elevated to 10 on admission,  GFR 6 , not improving. BUN 88. Bicarb stable ~19 - Net +6.8 L on admission, 1.5 L out yesterday.  P: - Continue holding home prograf, takes for [facility] - Nephrology following, greatly appreciate their recommendation. - Tunneled HD Cath placement today w IR - Continue IV fluids , LR @ 150cc/hr  #Covid-19 Off supplemental oxygen, improved symptoms.   #HTN SBP 127-168, on home meds Norvasc 10 mg , Coreg 25 mg twice daily, clonidine 0.3 mg twice daily, hydralazine 50 mg 3 times daily. P: - Continue home meds - antihypertensives per nephrology recommendations  #T2DM At inpatient goal.  Appears uncontrolled as outpatient , at least 2018-2020 per A1C's.   P: - Continue Lantus 15 units - SSI - M  Prior to Admission Living  Arrangement: Home Anticipated Discharge Location: Home Barriers to Discharge: Clinical improvement Dispo: Anticipated discharge with clinical improvement  Jeremy Snider, MD PGY1

## 2019-10-12 LAB — RENAL FUNCTION PANEL
Albumin: 1.8 g/dL — ABNORMAL LOW (ref 3.5–5.0)
Anion gap: 11 (ref 5–15)
BUN: 63 mg/dL — ABNORMAL HIGH (ref 6–20)
CO2: 22 mmol/L (ref 22–32)
Calcium: 7.5 mg/dL — ABNORMAL LOW (ref 8.9–10.3)
Chloride: 105 mmol/L (ref 98–111)
Creatinine, Ser: 10.53 mg/dL — ABNORMAL HIGH (ref 0.61–1.24)
GFR calc Af Amer: 6 mL/min — ABNORMAL LOW (ref >60)
GFR calc non Af Amer: 5 mL/min — ABNORMAL LOW (ref >60)
Glucose, Bld: 134 mg/dL — ABNORMAL HIGH (ref 70–99)
Phosphorus: 4.7 mg/dL — ABNORMAL HIGH (ref 2.5–4.6)
Potassium: 3.9 mmol/L (ref 3.5–5.1)
Sodium: 138 mmol/L (ref 135–145)

## 2019-10-12 LAB — GLUCOSE, CAPILLARY
Glucose-Capillary: 154 mg/dL — ABNORMAL HIGH (ref 70–99)
Glucose-Capillary: 135 mg/dL — ABNORMAL HIGH (ref 70–99)
Glucose-Capillary: 221 mg/dL — ABNORMAL HIGH (ref 70–99)
Glucose-Capillary: 214 mg/dL — ABNORMAL HIGH (ref 70–99)

## 2019-10-12 LAB — CBC
HCT: 37.6 % — ABNORMAL LOW (ref 39.0–52.0)
Hemoglobin: 11.9 g/dL — ABNORMAL LOW (ref 13.0–17.0)
MCH: 25.1 pg — ABNORMAL LOW (ref 26.0–34.0)
MCHC: 31.6 g/dL (ref 30.0–36.0)
MCV: 79.2 fL — ABNORMAL LOW (ref 80.0–100.0)
Platelets: 326 10*3/uL (ref 150–400)
RBC: 4.75 MIL/uL (ref 4.22–5.81)
RDW: 14.4 % (ref 11.5–15.5)
WBC: 5.7 10*3/uL (ref 4.0–10.5)
nRBC: 0 % (ref 0.0–0.2)

## 2019-10-12 LAB — D-DIMER, QUANTITATIVE: D-Dimer, Quant: 17.28 ug/mL-FEU — ABNORMAL HIGH (ref 0.00–0.50)

## 2019-10-12 LAB — C-REACTIVE PROTEIN: CRP: 4.5 mg/dL — ABNORMAL HIGH (ref <1.0)

## 2019-10-12 MED ORDER — POLYETHYLENE GLYCOL 3350 17 G PO PACK
17.0000 g | PACK | Freq: Two times a day (BID) | ORAL | Status: DC
  Filled 2019-10-12 (×2): qty 1

## 2019-10-12 MED ORDER — SENNOSIDES-DOCUSATE SODIUM 8.6-50 MG PO TABS
2.0000 | ORAL_TABLET | Freq: Two times a day (BID) | ORAL | Status: DC
  Administered 2019-10-12 – 2019-10-13 (×2): 2 via ORAL
  Filled 2019-10-12 (×3): qty 2

## 2019-10-12 NOTE — Progress Notes (Signed)
Patient reports that he has updated his family. HD dressing not change during shift. Dressing was not soiled. Protocol is to change every 7 days.

## 2019-10-12 NOTE — Progress Notes (Signed)
Patient is going to HD.

## 2019-10-12 NOTE — Progress Notes (Signed)
   Subjective:  Patient receiving HD on exam and tolerating it well. Reports no symptoms at this time, nausea has resolved.  In addition patient said he hopes he is able to come off HD, but up for anything which helps him get better. Discussed hypothetical HD schedules. Denies SOB on room air.   Objective:  Vital signs in last 24 hours: Vitals:   10/12/19 0300 10/12/19 0400 10/12/19 0455 10/12/19 0500  BP:   (!) 151/104   Pulse: 71 70 75 73  Resp: 19 17 (!) 24 15  Temp:   98.5 F (36.9 C)   TempSrc:      SpO2: 94% 95% 95% 95%  Weight:    (!) 136.5 kg  Height:       General: Obese man, NAD, receiving HD HEENT: EOMI, no scleral icterus Cardiovascular: RRR, no murmurs , rubs , or gallops Pulmonary : CTAB, good air movement  CBC Latest Ref Rng & Units 10/08/2019 04/20/2017 03/13/2017  WBC 4.0 - 10.5 K/uL 8.2 - -  Hemoglobin 13.0 - 17.0 g/dL 12.9(L) 12.1(L) 12.3(L)  Hematocrit 39.0 - 52.0 % 40.5 - -  Platelets 150 - 400 K/uL 180 - -      Intake/Output Summary (Last 24 hours) at 10/12/2019 [id number] Last data filed at 10/12/2019 0000 Gross per 24 hour  Intake 1160.76 ml  Output 450 ml  Net 710.76 ml    Assessment/Plan:  Active Problems:   AKI (acute kidney injury) ([place])  Jeremy [name] 39 year old man with a past medical history FSGS and membranous nephropathy, CKD stage 3 , HTN, HLD, type 2 diabetes, and asthma who presented 6 days into COVID -19 infection. Admission complicated by acute on chronic renal disease.   #AKI on CKD stage 3  -Followed by Dr.Dunn for membranous nephropathy/FSGS.  - Baseline Cr ~ 2-2.5, Cr elevated to 10 on admission,  GFR 6 , not improving.  - Tunneled HD cath placed yesterday.   P: - Continue holding home prograf, takes for [facility] - Nephrology following, greatly appreciate their recommendation. - HD today   #Covid-19 Off supplemental oxygen, improved symptoms.   #HTN SBP 136-177, on home meds Norvasc 10 mg , Coreg 25 mg twice daily, clonidine 0.3 mg  twice daily, hydralazine 50 mg 3 times daily. P: - Continue home meds - Chronic HTN, Goal SBP > 120 in setting of AKI  #T2DM - Appears uncontrolled as outpatient , at least 2018-2020 per A1C's.  - At inpatient goal., Received 15 units Aspart last 24 hrs. BG's ~100 to 140 today.  P: - Continue Lantus 15 units - SSI - M  Prior to Admission Living Arrangement: Home Anticipated Discharge Location: Home Barriers to Discharge: Clinical improvement Dispo: Anticipated discharge with clinical improvement  Jeremy Snider, MD PGY1

## 2019-10-12 NOTE — Progress Notes (Signed)
[facility] Progress Note    Assessment/ Plan:   1. AoCKD3 - baseline creat 2.5 f/b Dr Lorrene Reid.  In patient w/ COVID+ infection and nausea/ vomiting/ diarrhea. Suspect AKI related to dehydration. UA is proteinuria, renal US unremarkable, urine lytes c/w vol depletion.  Was very dehydrated on admission.  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  Previously on tacrolimus until this most recent insult.  S/p TDC 2/2, HD #1 2/3.  Will plan to make day by day assessment, if needed will do another HD 2/5. 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 HTN - BP high on admit, down w/ po meds, keep SBP > 120 in chronic htn pt w/ AKI.  4. COVID+ infection - per primary 5. DM 2 - on insulin  Subjective:    S/p HD #1 today, feeling much better.  Still having some UOP   Objective:   BP (!) 149/99 (BP Location: Right Arm)   Pulse 74   Temp 98.8 F (37.1 C) (Oral)   Resp 19   Ht 6\' 5"  (1.956 m)   Wt (!) 139.1 kg   SpO2 96%   BMI 36.36 kg/m   Intake/Output Summary (Last 24 hours) at 10/12/2019 1416 Last data filed at 10/12/2019 1030 Gross per 24 hour  Intake 368.26 ml  Output 450 ml  Net -81.74 ml   Weight change: 1.1 kg  Physical Exam: Gen: NAD, lying in bed, appears much better CVS: RRR Resp: clear bilaterally no c/w/r Abd: soft, not bloated anymore Ext: no LE edema ACESS: R IJ TDC  Imaging: IR Fluoro Guide CV Line Right  Result Date: 10/11/2019 INDICATION: 39 year old with acute kidney injury. Catheter needed for hemodialysis. EXAM: FLUOROSCOPIC AND ULTRASOUND GUIDED PLACEMENT OF A TUNNELED DIALYSIS CATHETER Physician: Stephan Minister. Anselm Pancoast, MD MEDICATIONS: Ancef 2 g; The antibiotic was administered within an appropriate time interval prior to skin puncture. ANESTHESIA/SEDATION: Versed 3.0 mg IV; Fentanyl 150 mcg IV; Moderate Sedation Time:  24 minutes The patient was continuously monitored during the procedure by the  interventional radiology nurse under my direct supervision. FLUOROSCOPY TIME:  Fluoroscopy Time: 24 seconds, 4 mGy COMPLICATIONS: None immediate. PROCEDURE: The procedure was explained to the patient. The risks and benefits of the procedure were discussed and the patient's questions were addressed. Informed consent was obtained from the patient. The patient was placed supine on the interventional table. Ultrasound confirmed a patent right internal jugular vein. Ultrasound images were obtained for documentation. The right neck and chest was prepped and draped in a sterile fashion. The right neck was anesthetized with 1% lidocaine. Maximal barrier sterile technique was utilized including caps, mask, sterile gowns, sterile gloves, sterile drape, hand hygiene and skin antiseptic. A small incision was made with #11 blade scalpel. A 21 gauge needle directed into the right internal jugular vein with ultrasound guidance. A micropuncture dilator set was placed. A 23 cm tip to cuff Palindrome catheter was selected. The skin below the right clavicle was anesthetized and a small incision was made with an #11 blade scalpel. A subcutaneous tunnel was formed to the vein dermatotomy site. The catheter was brought through the tunnel. The vein dermatotomy site was dilated to accommodate a peel-away sheath. The catheter was placed through the peel-away sheath and directed into the central venous structures. The tip of the catheter was placed in the right atrium with fluoroscopy. Fluoroscopic images were obtained for documentation. Both lumens  were found to aspirate and flush well. The proper amount of heparin was flushed in both lumens. The vein dermatotomy site was closed using a single layer of absorbable suture and Dermabond. Gel-Foam placed in subcutaneous tract. The catheter was secured to the skin using Prolene suture. IMPRESSION: Successful placement of a right jugular tunneled dialysis catheter using ultrasound and  fluoroscopic guidance. Electronically Signed   By: Markus Daft M.D.   On: 10/11/2019 15:24   IR US Guide Vasc Access Right  Result Date: 10/11/2019 INDICATION: 39 year old with acute kidney injury. Catheter needed for hemodialysis. EXAM: FLUOROSCOPIC AND ULTRASOUND GUIDED PLACEMENT OF A TUNNELED DIALYSIS CATHETER Physician: Stephan Minister. Anselm Pancoast, MD MEDICATIONS: Ancef 2 g; The antibiotic was administered within an appropriate time interval prior to skin puncture. ANESTHESIA/SEDATION: Versed 3.0 mg IV; Fentanyl 150 mcg IV; Moderate Sedation Time:  24 minutes The patient was continuously monitored during the procedure by the interventional radiology nurse under my direct supervision. FLUOROSCOPY TIME:  Fluoroscopy Time: 24 seconds, 4 mGy COMPLICATIONS: None immediate. PROCEDURE: The procedure was explained to the patient. The risks and benefits of the procedure were discussed and the patient's questions were addressed. Informed consent was obtained from the patient. The patient was placed supine on the interventional table. Ultrasound confirmed a patent right internal jugular vein. Ultrasound images were obtained for documentation. The right neck and chest was prepped and draped in a sterile fashion. The right neck was anesthetized with 1% lidocaine. Maximal barrier sterile technique was utilized including caps, mask, sterile gowns, sterile gloves, sterile drape, hand hygiene and skin antiseptic. A small incision was made with #11 blade scalpel. A 21 gauge needle directed into the right internal jugular vein with ultrasound guidance. A micropuncture dilator set was placed. A 23 cm tip to cuff Palindrome catheter was selected. The skin below the right clavicle was anesthetized and a small incision was made with an #11 blade scalpel. A subcutaneous tunnel was formed to the vein dermatotomy site. The catheter was brought through the tunnel. The vein dermatotomy site was dilated to accommodate a peel-away sheath. The catheter  was placed through the peel-away sheath and directed into the central venous structures. The tip of the catheter was placed in the right atrium with fluoroscopy. Fluoroscopic images were obtained for documentation. Both lumens were found to aspirate and flush well. The proper amount of heparin was flushed in both lumens. The vein dermatotomy site was closed using a single layer of absorbable suture and Dermabond. Gel-Foam placed in subcutaneous tract. The catheter was secured to the skin using Prolene suture. IMPRESSION: Successful placement of a right jugular tunneled dialysis catheter using ultrasound and fluoroscopic guidance. Electronically Signed   By: Markus Daft M.D.   On: 10/11/2019 15:24    Labs: BMET Recent Labs  Lab 10/08/19 1056 10/09/19 0341 10/10/19 0438 10/11/19 0313 10/12/19 1204  NA 138 139 139 140 138  K 4.4 4.4 4.5 4.5 3.9  CL 104 108 106 108 105  CO2 17* 18* 17* 19* 22  GLUCOSE 220* 208* 151* 150* 134*  BUN 72* 78* 82* 88* 63*  CREATININE 10.30* 10.60* 11.54* 12.14* 10.53*  CALCIUM 7.3* 6.6* 6.9* 7.0* 7.5*  PHOS  --  5.6* 5.8* 4.8* 4.7*   CBC Recent Labs  Lab 10/08/19 1056 10/12/19 1204  WBC 8.2 5.7  NEUTROABS 6.7  --   HGB 12.9* 11.9*  HCT 40.5 37.6*  MCV 80.5 79.2*  PLT 180 326    Medications:    . amLODipine  10 mg Oral QHS  . atorvastatin  80 mg Oral Daily  . carvedilol  25 mg Oral BID WC  . Chlorhexidine Gluconate Cloth  6 each Topical Q0600  . Chlorhexidine Gluconate Cloth  6 each Topical Q0600  . cloNIDine  0.3 mg Oral BID  . famotidine  10 mg Oral Daily  . heparin  5,000 Units Subcutaneous Q8H  . hydrALAZINE  50 mg Oral TID  . insulin aspart  0-15 Units Subcutaneous TID WC  . insulin glargine  15 Units Subcutaneous QHS  . polyethylene glycol  17 g Oral BID  . senna-docusate  2 tablet Oral BID      Madelon Lips, MD 10/12/2019, 2:16 PM

## 2019-10-13 DIAGNOSIS — K5900 Constipation, unspecified: Secondary | ICD-10-CM

## 2019-10-13 LAB — TACROLIMUS LEVEL: Tacrolimus (FK506) - LabCorp: <1 ng/mL — ABNORMAL LOW (ref 2.0–20.0)

## 2019-10-13 LAB — CULTURE, BLOOD (ROUTINE X 2)
Culture: NO GROWTH
Culture: NO GROWTH
Special Requests: ADEQUATE

## 2019-10-13 LAB — RENAL FUNCTION PANEL
Albumin: 1.7 g/dL — ABNORMAL LOW (ref 3.5–5.0)
Anion gap: 12 (ref 5–15)
BUN: 69 mg/dL — ABNORMAL HIGH (ref 6–20)
CO2: 22 mmol/L (ref 22–32)
Calcium: 7.4 mg/dL — ABNORMAL LOW (ref 8.9–10.3)
Chloride: 104 mmol/L (ref 98–111)
Creatinine, Ser: 11.9 mg/dL — ABNORMAL HIGH (ref 0.61–1.24)
GFR calc Af Amer: 5 mL/min — ABNORMAL LOW (ref >60)
GFR calc non Af Amer: 5 mL/min — ABNORMAL LOW (ref >60)
Glucose, Bld: 209 mg/dL — ABNORMAL HIGH (ref 70–99)
Phosphorus: 5.4 mg/dL — ABNORMAL HIGH (ref 2.5–4.6)
Potassium: 4 mmol/L (ref 3.5–5.1)
Sodium: 138 mmol/L (ref 135–145)

## 2019-10-13 LAB — D-DIMER, QUANTITATIVE: D-Dimer, Quant: 15.02 ug/mL-FEU — ABNORMAL HIGH (ref 0.00–0.50)

## 2019-10-13 LAB — GLUCOSE, CAPILLARY
Glucose-Capillary: 196 mg/dL — ABNORMAL HIGH (ref 70–99)
Glucose-Capillary: 189 mg/dL — ABNORMAL HIGH (ref 70–99)
Glucose-Capillary: 183 mg/dL — ABNORMAL HIGH (ref 70–99)
Glucose-Capillary: 182 mg/dL — ABNORMAL HIGH (ref 70–99)

## 2019-10-13 LAB — C-REACTIVE PROTEIN: CRP: 4 mg/dL — ABNORMAL HIGH (ref <1.0)

## 2019-10-13 MED ORDER — HYDRALAZINE HCL 50 MG PO TABS
100.0000 mg | ORAL_TABLET | Freq: Three times a day (TID) | ORAL | Status: DC
  Administered 2019-10-13 – 2019-10-16 (×10): 100 mg via ORAL
  Filled 2019-10-13 (×10): qty 2

## 2019-10-13 MED ORDER — POLYETHYLENE GLYCOL 3350 17 G PO PACK: 17.0000 g | PACK | Freq: Two times a day (BID) | ORAL | Status: DC | PRN

## 2019-10-13 MED ORDER — SENNOSIDES-DOCUSATE SODIUM 8.6-50 MG PO TABS: 2.0000 | ORAL_TABLET | Freq: Two times a day (BID) | ORAL | Status: DC | PRN

## 2019-10-13 NOTE — Progress Notes (Signed)
Subjective:  Patient feels overall well. Tolerated HD well. No nausea. Bowel movement overnight. Discussed plan to continue to monitor kidney function and nephrology will guide plan for HD.   Objective:  Vital signs in last 24 hours: Vitals:   10/12/19 2121 10/12/19 2142 10/13/19 0439 10/13/19 0510  BP: (!) 152/94   (!) 152/99  Pulse: 67   70  Resp: 19 17 (!) 25 (!) 27  Temp: 98.2 F (36.8 C)   98.6 F (37 C)  TempSrc: Oral   Oral  SpO2: 98% 97%  100%  Weight:      Height:       General:  NAD, resting in bed HEENT: EOMI, no scleral icterus Cardiovascular: RRR, no murmurs , rubs , or gallops Pulmonary : CTAB, good air movement. Breathing comfortably on room air  CBC Latest Ref Rng & Units 10/12/2019 10/08/2019 04/20/2017  WBC 4.0 - 10.5 K/uL 5.7 8.2 -  Hemoglobin 13.0 - 17.0 g/dL 11.9(L) 12.9(L) 12.1(L)  Hematocrit 39.0 - 52.0 % 37.6(L) 40.5 -  Platelets 150 - 400 K/uL 326 180 -   BMP Latest Ref Rng & Units 10/13/2019 10/12/2019 10/11/2019  Glucose 70 - 99 mg/dL 209(H) 134(H) 150(H)  BUN 6 - 20 mg/dL 69(H) 63(H) 88(H)  Creatinine 0.61 - 1.24 mg/dL 11.90(H) 10.53(H) 12.14(H)  BUN/Creat Ratio 9 - 20 - - -  Sodium 135 - 145 mmol/L 138 138 140  Potassium 3.5 - 5.1 mmol/L 4.0 3.9 4.5  Chloride 98 - 111 mmol/L 104 105 108  CO2 22 - 32 mmol/L 22 22 19(L)  Calcium 8.9 - 10.3 mg/dL 7.4(L) 7.5(L) 7.0(L)     Intake/Output Summary (Last 24 hours) at 10/13/2019 0558 Last data filed at 10/13/2019 0510 Gross per 24 hour  Intake 500 ml  Output 200 ml  Net 300 ml    Assessment/Plan:  Active Problems:   AKI (acute kidney injury) ([place])  Jeremy [name] 39 year old man with a past medical history FSGS and membranous nephropathy, CKD stage 3 , HTN, HLD, type 2 diabetes, and asthma who presented 6 days into COVID -19 infection. Admission complicated by acute on chronic renal disease.   #AKI on CKD stage 3  -Followed by Dr.Dunn for membranous nephropathy/FSGS.  - Baseline Cr ~ 2-2.5, Cr  elevated to 10 on admission,  GFR 6 . Patient's Cr continued to trend up. Tunneled HD cath placed 2/2 and HD#1 on 2/3.  - 200 cc urine ouptut yesterday, Cr not improving on morning labs - Nephrology is assessing daily, possible HD on 2/5 if needed. P: - Continue holding home prograf, takes for [facility] - Nephrology following, greatly appreciate their recommendation.   #Covid-19, mild- moderate Patient able to maintain 02 sats >90 , no role for Remdesivir or dexamethasone. Fever,chill, body aches have improved. P: - Continue to monitor on admisson  #Constipation Bowel movement overnight.  P: Continue Senna-docusate prn Continue Miralax prn  #HTN On home meds Norvasc 10 mg , Coreg 25 mg twice daily, clonidine 0.3 mg twice daily, hydralazine 50 mg 3 times daily. - SBP improved to 150's after HD yesterday. P: - Continue home meds - Chronic HTN, Goal SBP > 120 in setting of AKI  #T2DM - Appears uncontrolled as outpatient , at least 2018-2020 per A1C's.  - At inpatient goal., Received 7 units Aspart last 24 hrs. BG's ~185 to 200 today.  P: - Continue Lantus 15 units - SSI - M  Prior to Admission Living Arrangement: Home Anticipated Discharge Location: Home  Barriers to Discharge: Clinical improvement Dispo: Anticipated discharge with clinical improvement  [name], MD PGY1

## 2019-10-13 NOTE — Plan of Care (Signed)
  Problem: Education: Goal: Knowledge of General Education information will improve Description: Including pain rating scale, medication(s)/side effects and non-pharmacologic comfort measures Outcome: Progressing   Problem: Health Behavior/Discharge Planning: Goal: Ability to manage health-related needs will improve Outcome: Progressing   Problem: Clinical Measurements: Goal: Ability to maintain clinical measurements within normal limits will improve Outcome: Progressing Goal: Will remain free from infection Outcome: Progressing Goal: Diagnostic test results will improve Outcome: Progressing Goal: Respiratory complications will improve Outcome: Progressing Goal: Cardiovascular complication will be avoided Outcome: Progressing   Problem: Activity: Goal: Risk for activity intolerance will decrease Outcome: Progressing   Problem: Nutrition: Goal: Adequate nutrition will be maintained Outcome: Progressing   Problem: Coping: Goal: Level of anxiety will decrease Outcome: Progressing   Problem: Elimination: Goal: Will not experience complications related to bowel motility Outcome: Progressing Goal: Will not experience complications related to urinary retention Outcome: Progressing   Problem: Education: Goal: Knowledge of risk factors and measures for prevention of condition will improve Outcome: Progressing   Problem: Respiratory: Goal: Will maintain a patent airway Outcome: Progressing Goal: Complications related to the disease process, condition or treatment will be avoided or minimized Outcome: Progressing

## 2019-10-13 NOTE — Progress Notes (Signed)
  [facility] Progress Note    Assessment/ Plan:   1. AoCKD3 - baseline creat 2.5 f/b Dr Lorrene Reid.  In patient w/ COVID+ infection and nausea/ vomiting/ diarrhea. Suspect AKI related to dehydration. UA is proteinuria, renal US unremarkable, urine lytes c/w vol depletion.  Was very dehydrated on admission.  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  Previously on tacrolimus until this most recent insult.  S/p TDC 2/2, HD #1 2/3.  Will plan to make day by day assessment, if needed will do another HD 2/5.  Will place orders and will check with MD before bringing to unit.    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 HTN - BP high on admit, down w/ po meds, keep SBP > 120 in chronic htn pt w/ AKI.  Increase hydralazine to 100 mg TID 4. COVID+ infection - per primary 5. DM 2 - on insulin  Subjective:    Notes that he's having more UOP but not quantified. Feeling much ebtter.     Objective:   BP (!) 172/108 (BP Location: Right Leg)   Pulse 70   Temp 98.6 F (37 C) (Oral)   Resp 17   Ht 6\' 5"  (1.956 m)   Wt (!) 139.1 kg   SpO2 100%   BMI 36.36 kg/m   Intake/Output Summary (Last 24 hours) at 10/13/2019 1750 Last data filed at 10/13/2019 1300 Gross per 24 hour  Intake 300 ml  Output 200 ml  Net 100 ml   Weight change: 2.6 kg  Physical Exam: Gen: NAD, lying in bed, appears much better CVS: RRR Resp: clear bilaterally no c/w/r Abd: soft, not bloated anymore Ext: no LE edema ACESS: R IJ TDC  Imaging: No results found.  Labs: BMET Recent Labs  Lab 10/08/19 1056 10/09/19 0341 10/10/19 0438 10/11/19 0313 10/12/19 1204 10/13/19 0431  NA 138 139 139 140 138 138  K 4.4 4.4 4.5 4.5 3.9 4.0  CL 104 108 106 108 105 104  CO2 17* 18* 17* 19* 22 22  GLUCOSE 220* 208* 151* 150* 134* 209*  BUN 72* 78* 82* 88* 63* 69*  CREATININE 10.30* 10.60* 11.54* 12.14* 10.53* 11.90*  CALCIUM 7.3* 6.6* 6.9* 7.0* 7.5*  7.4*  PHOS  --  5.6* 5.8* 4.8* 4.7* 5.4*   CBC Recent Labs  Lab 10/08/19 1056 10/12/19 1204  WBC 8.2 5.7  NEUTROABS 6.7  --   HGB 12.9* 11.9*  HCT 40.5 37.6*  MCV 80.5 79.2*  PLT 180 326    Medications:    . amLODipine  10 mg Oral QHS  . atorvastatin  80 mg Oral Daily  . carvedilol  25 mg Oral BID WC  . Chlorhexidine Gluconate Cloth  6 each Topical Q0600  . cloNIDine  0.3 mg Oral BID  . famotidine  10 mg Oral Daily  . heparin  5,000 Units Subcutaneous Q8H  . hydrALAZINE  50 mg Oral TID  . insulin aspart  0-15 Units Subcutaneous TID WC  . insulin glargine  15 Units Subcutaneous QHS      Madelon Lips, MD 10/13/2019, 5:50 PM

## 2019-10-13 NOTE — Progress Notes (Signed)
Reported elevated d-dimer to Dr. Court Joy.

## 2019-10-14 DIAGNOSIS — Z79899 Other long term (current) drug therapy: Secondary | ICD-10-CM

## 2019-10-14 DIAGNOSIS — I129 Hypertensive chronic kidney disease with stage 1 through stage 4 chronic kidney disease, or unspecified chronic kidney disease: Secondary | ICD-10-CM

## 2019-10-14 DIAGNOSIS — E1122 Type 2 diabetes mellitus with diabetic chronic kidney disease: Secondary | ICD-10-CM

## 2019-10-14 DIAGNOSIS — N179 Acute kidney failure, unspecified: Principal | ICD-10-CM

## 2019-10-14 DIAGNOSIS — U071 COVID-19: Secondary | ICD-10-CM

## 2019-10-14 DIAGNOSIS — J45909 Unspecified asthma, uncomplicated: Secondary | ICD-10-CM

## 2019-10-14 DIAGNOSIS — Z794 Long term (current) use of insulin: Secondary | ICD-10-CM

## 2019-10-14 DIAGNOSIS — N183 Chronic kidney disease, stage 3 unspecified: Secondary | ICD-10-CM

## 2019-10-14 DIAGNOSIS — E785 Hyperlipidemia, unspecified: Secondary | ICD-10-CM

## 2019-10-14 LAB — D-DIMER, QUANTITATIVE: D-Dimer, Quant: 12.63 ug/mL-FEU — ABNORMAL HIGH (ref 0.00–0.50)

## 2019-10-14 LAB — RENAL FUNCTION PANEL
Albumin: 1.7 g/dL — ABNORMAL LOW (ref 3.5–5.0)
Anion gap: 11 (ref 5–15)
BUN: 74 mg/dL — ABNORMAL HIGH (ref 6–20)
CO2: 20 mmol/L — ABNORMAL LOW (ref 22–32)
Calcium: 7.3 mg/dL — ABNORMAL LOW (ref 8.9–10.3)
Chloride: 107 mmol/L (ref 98–111)
Creatinine, Ser: 13.15 mg/dL — ABNORMAL HIGH (ref 0.61–1.24)
GFR calc Af Amer: 5 mL/min — ABNORMAL LOW (ref >60)
GFR calc non Af Amer: 4 mL/min — ABNORMAL LOW (ref >60)
Glucose, Bld: 161 mg/dL — ABNORMAL HIGH (ref 70–99)
Phosphorus: 6 mg/dL — ABNORMAL HIGH (ref 2.5–4.6)
Potassium: 4.3 mmol/L (ref 3.5–5.1)
Sodium: 138 mmol/L (ref 135–145)

## 2019-10-14 LAB — GLUCOSE, CAPILLARY
Glucose-Capillary: 151 mg/dL — ABNORMAL HIGH (ref 70–99)
Glucose-Capillary: 201 mg/dL — ABNORMAL HIGH (ref 70–99)
Glucose-Capillary: 130 mg/dL — ABNORMAL HIGH (ref 70–99)
Glucose-Capillary: 140 mg/dL — ABNORMAL HIGH (ref 70–99)

## 2019-10-14 LAB — C-REACTIVE PROTEIN: CRP: 2.5 mg/dL — ABNORMAL HIGH (ref <1.0)

## 2019-10-14 MED ORDER — HEPARIN SODIUM (PORCINE) 1000 UNIT/ML IJ SOLN
INTRAMUSCULAR | Status: AC
  Filled 2019-10-14: qty 4

## 2019-10-14 MED ORDER — HYDROMORPHONE HCL 2 MG PO TABS
1.0000 mg | ORAL_TABLET | Freq: Once | ORAL | Status: AC
  Administered 2019-10-14: 1 mg via ORAL
  Filled 2019-10-14: qty 1

## 2019-10-14 NOTE — Progress Notes (Signed)
   Subjective: Patient reports that he was doing well today, no acute events overnight.  He reports that he is urinating more.  He reports that he wants to go home, feels like he should be able to do the dialysis at home.  We discussed that we will need to see if this will be able to be arranged however this may take a while.   Objective:  Vital signs in last 24 hours: Vitals:   10/13/19 1500 10/13/19 2129 10/14/19 0544 10/14/19 0618  BP: (!) 172/108 (!) 166/90 (!) 175/109   Pulse:  71 75   Resp: 17 18 12    Temp:  98.5 F (36.9 C) 98.3 F (36.8 C)   TempSrc:  Oral Oral   SpO2:   100%   Weight:    (!) 138.4 kg  Height:        General: Well-appearing male, no acute distress, laying in bed Cardiac: Regular rate and rhythm, no murmurs rubs or gallops Pulmonary: CTA BL, no wheezing, rhonchi, rales Abdomen: Soft, nontender, nondistended Extremity: No lower extremity edema Psychiatry: Normal mood and affect  Assessment/Plan:  Active Problems:   AKI (acute kidney injury) ([place])  This is a 39 year old male with a history of FSGS, membranous nephropathy, CKD sage 3, HTN, HLD, DMII, and asthma who presented with fevers, chills, body caches, decreased appetite, loss of smell and taste, abd discomfort, and dark urine, with a COVID test 6 days PTA that was positive. During admission he was noted to have significant AKI with Cr elevated to 10.3.   Acute on chronic kidney disease: -Patient follows with Dr. Idolina Primer for membranous neprhopathy/FSGS, He was on Prograf at home.  -BL Cr was around 2.2, on admission Cr elevated to 10 with GFR 6. Fluid challanged with no improvement. Nephrology following, started on HD.  Recorded output yesterday was 400 cc, net -160. Creatinine elevated to 13.15 today, potassium 4.3.  Patient will likely need dialysis today. -Tunneled HD cath placed 2/5 -S/p HD #1 on 2/3 -Nephrology following, appreciate recommendations, possible dialysis today -We will need to see if  outpatient dialysis can be arranged -Continue holding home Prograf  COVID-19 infection: Patient currently on RA, no increased O2 requirements. No indication for remdesivir or decadron now.   HTN: -Patient on amlodipine 10 mg daily, carvedilol 25 mg daily, hydralazine 100 mg 3 times daily, aspirin 1 mg and atorvastatin 80 mg daily at home. Currently hypertensive to 175/109, keep SBP greater than 120 and chronic hypertension patient with AKI.[name]  Hydralizine increased yesterday to 100 3 times daily. -Continue hydralazine 100 mg 3 times daily -Continue amlodipine 10 mg daily -Continue Coreg 25 mg twice daily -Continue clonidine 0.3 mg twice daily -Continue labetalol 5 mg every 4 as needed   DMII: Patient on glargine 36 units nightly and glipizide 5 mg daily at home. A1c on 3/20 was elevated to >14, not well controlled outpatient. Currently on Lantus 15 units daily and SSI. CBGs 151-209.  -Continue lantus 15 -Continue SSI  #CODE STATUS: FULL  #Dispo: pending clinical improvement Prior to Admission Living Arrangement: Home Anticipated Discharge Location: Home  Barriers to Discharge: Ongoing medical management  Asencion Noble, MD 10/14/2019, 7:01 AM Pager: [phone]

## 2019-10-14 NOTE — Progress Notes (Signed)
[facility] NEPHROLOGY PROGRESS NOTE  Assessment/ Plan: Pt is a 39 y.o. yo male with history of CKD admitted with COVID-19 infection, pneumonia, developed AKI on CKD now on dialysis.  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  Dialysis dependent. Suspect AKI related to dehydration.  UA with proteinuria.  Renal US unremarkable, urine lites consistent with dehydration.  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 -HD today. I think he will be dialysis dependent therefore we will continue Monday Wednesday Friday schedule. Arrangement for outpatient HD center.  All discussed with renal navigator.  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 Lorrene Reid.    # Anemia of CKD: Hemoglobin at goal  # Secondary hyperparathyroidism: Mild elevation phosphorus.  Not on binders  # HTN/volume: Currently on dialysis.  Continue current antihypertensive medication.  UF during HD.  Subjective: Seen at dialysis room.  He looks comfortable.  Tolerating dialysis well.  No new event. Objective Vital signs in last 24 hours: Vitals:   10/14/19 1415 10/14/19 1445 10/14/19 1515 10/14/19 1545  BP: (!) 152/94 (!) 159/94 (!) 174/99 (!) 168/97  Pulse: 68 66 66 66  Resp: 20 20 20 20   Temp:      TempSrc:      SpO2:      Weight:      Height:       Weight change: -0.7 kg  Intake/Output Summary (Last 24 hours) at 10/14/2019 1553 Last data filed at 10/14/2019 1000 Gross per 24 hour  Intake 240 ml  Output 400 ml  Net -160 ml       Labs: Basic Metabolic Panel: Recent Labs  Lab 10/12/19 1204 10/13/19 0431 10/14/19 0500  NA 138 138 138  K 3.9 4.0 4.3  CL 105 104 107  CO2 22 22 20*  GLUCOSE 134* 209* 161*  BUN 63* 69* 74*  CREATININE 10.53* 11.90* 13.15*  CALCIUM 7.5* 7.4* 7.3*  PHOS 4.7* 5.4* 6.0*   Liver Function Tests: Recent Labs   Lab 10/08/19 1056 10/09/19 0341 10/12/19 1204 10/13/19 0431 10/14/19 0500  AST 47*  --   --   --   --   ALT 25  --   --   --   --   ALKPHOS 51  --   --   --   --   BILITOT 0.6  --   --   --   --   PROT 5.9*  --   --   --   --   ALBUMIN 2.0*   < > 1.8* 1.7* 1.7*   < > = values in this interval not displayed.   Recent Labs  Lab 10/08/19 1056  LIPASE 48   No results for input(s): AMMONIA in the last 168 hours. CBC: Recent Labs  Lab 10/08/19 1056 10/12/19 1204  WBC 8.2 5.7  NEUTROABS 6.7  --   HGB 12.9* 11.9*  HCT 40.5 37.6*  MCV 80.5 79.2*  PLT 180 326   Cardiac Enzymes: No results for input(s): CKTOTAL, CKMB, CKMBINDEX, TROPONINI in the last 168 hours. CBG: Recent Labs  Lab 10/13/19 1158 10/13/19 1813 10/13/19 2127 10/14/19 0748 10/14/19 1159  GLUCAP 189* 183* 182* 151* 201*    Iron Studies: No results for input(s): IRON, TIBC, TRANSFERRIN, FERRITIN in the last 72 hours. Studies/Results: No results found.  Medications: Infusions: . sodium chloride    .  sodium chloride      Scheduled Medications: . amLODipine  10 mg Oral QHS  . atorvastatin  80 mg Oral Daily  . carvedilol  25 mg Oral BID WC  . Chlorhexidine Gluconate Cloth  6 each Topical Q0600  . cloNIDine  0.3 mg Oral BID  . famotidine  10 mg Oral Daily  . heparin      . heparin  5,000 Units Subcutaneous Q8H  . hydrALAZINE  100 mg Oral TID  . insulin aspart  0-15 Units Subcutaneous TID WC  . insulin glargine  15 Units Subcutaneous QHS    have reviewed scheduled and prn medications.  Physical Exam: General:NAD, comfortable Dialysis Access: Right IJ TDC.  Meighan Treto Tanna Furry 10/14/2019,3:53 PM  LOS: 6 days  Pager: ID:[phone]

## 2019-10-14 NOTE — Progress Notes (Signed)
Pt is in dialysis

## 2019-10-14 NOTE — Progress Notes (Signed)
PT came back from dialysis and he was irate, swearing at staff and talking on the phone constantly.  He has an area on the right side of his neck where there is an area that is missing the top layer of skin.  Pt states that he had a bandaid on the right side of his neck and when they changed it in dialysis, it pulled the top layer of skin off.  The only dressing I recall on the right side of his neck was his dialysis dressing.   Charge nurse, Ihor Dow came to look at his neck when pt was complaining.  Measurements and treatments were charted.

## 2019-10-15 LAB — GLUCOSE, CAPILLARY
Glucose-Capillary: 148 mg/dL — ABNORMAL HIGH (ref 70–99)
Glucose-Capillary: 150 mg/dL — ABNORMAL HIGH (ref 70–99)
Glucose-Capillary: 207 mg/dL — ABNORMAL HIGH (ref 70–99)
Glucose-Capillary: 261 mg/dL — ABNORMAL HIGH (ref 70–99)

## 2019-10-15 LAB — RENAL FUNCTION PANEL
Albumin: 1.9 g/dL — ABNORMAL LOW (ref 3.5–5.0)
Anion gap: 13 (ref 5–15)
BUN: 49 mg/dL — ABNORMAL HIGH (ref 6–20)
CO2: 23 mmol/L (ref 22–32)
Calcium: 7.7 mg/dL — ABNORMAL LOW (ref 8.9–10.3)
Chloride: 103 mmol/L (ref 98–111)
Creatinine, Ser: 10.3 mg/dL — ABNORMAL HIGH (ref 0.61–1.24)
GFR calc Af Amer: 7 mL/min — ABNORMAL LOW (ref >60)
GFR calc non Af Amer: 6 mL/min — ABNORMAL LOW (ref >60)
Glucose, Bld: 200 mg/dL — ABNORMAL HIGH (ref 70–99)
Phosphorus: 5.5 mg/dL — ABNORMAL HIGH (ref 2.5–4.6)
Potassium: 4.1 mmol/L (ref 3.5–5.1)
Sodium: 139 mmol/L (ref 135–145)

## 2019-10-15 LAB — D-DIMER, QUANTITATIVE: D-Dimer, Quant: 11.19 ug/mL-FEU — ABNORMAL HIGH (ref 0.00–0.50)

## 2019-10-15 LAB — C-REACTIVE PROTEIN: CRP: 2.4 mg/dL — ABNORMAL HIGH (ref <1.0)

## 2019-10-15 MED ORDER — FERRIC CITRATE 1 GM 210 MG(FE) PO TABS
420.0000 mg | ORAL_TABLET | Freq: Three times a day (TID) | ORAL | Status: DC
  Administered 2019-10-15 – 2019-10-17 (×6): 420 mg via ORAL
  Filled 2019-10-15 (×6): qty 2

## 2019-10-15 NOTE — Progress Notes (Addendum)
[name] NEPHROLOGY PROGRESS NOTE  Assessment/ Plan: Pt is a 39 y.o. yo male with history of CKD admitted with COVID-19 infection, pneumonia, developed AKI on CKD now on dialysis.  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  Dialysis dependent. Suspect AKI related to dehydration.  UA with proteinuria.  Renal US unremarkable, urine lites consistent with dehydration.  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 -HD on 2/5 with 1300 cc UF.  Tolerated well. I think he will be dialysis dependent therefore we will continue Monday Wednesday Friday schedule. Arrangement for outpatient HD center. I discussed with renal navigator.  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 Lorrene Reid.    # Anemia of CKD: Hemoglobin at goal  # Secondary hyperparathyroidism: Mild elevation phosphorus, start [place].  # HTN/volume: Currently on dialysis.  Continue current antihypertensive medication.  UF during HD.  Subjective: No new event.  Chart and lab results reviewed.  Dialysis tolerated yesterday. Objective Vital signs in last 24 hours: Vitals:   10/14/19 1800 10/14/19 1818 10/14/19 2139 10/15/19 0606  BP:  (!) 189/107 (!) 178/94 (!) 147/86  Pulse:    72  Resp:   18 11  Temp: 97.7 F (36.5 C)  99.1 F (37.3 C) 98.5 F (36.9 C)  TempSrc: Oral  Oral Oral  SpO2: 98%  98% 99%  Weight:    (!) 138.9 kg  Height:       Weight change: 2.3 kg  Intake/Output Summary (Last 24 hours) at 10/15/2019 1044 Last data filed at 10/15/2019 0600 Gross per 24 hour  Intake 440 ml  Output 1300 ml  Net -860 ml       Labs: Basic Metabolic Panel: Recent Labs  Lab 10/12/19 1204 10/13/19 0431 10/14/19 0500  NA 138 138 138  K 3.9 4.0 4.3  CL 105 104 107  CO2 22 22 20*  GLUCOSE 134* 209* 161*  BUN 63* 69* 74*  CREATININE 10.53* 11.90* 13.15*   CALCIUM 7.5* 7.4* 7.3*  PHOS 4.7* 5.4* 6.0*   Liver Function Tests: Recent Labs  Lab 10/08/19 1056 10/09/19 0341 10/12/19 1204 10/13/19 0431 10/14/19 0500  AST 47*  --   --   --   --   ALT 25  --   --   --   --   ALKPHOS 51  --   --   --   --   BILITOT 0.6  --   --   --   --   PROT 5.9*  --   --   --   --   ALBUMIN 2.0*   < > 1.8* 1.7* 1.7*   < > = values in this interval not displayed.   Recent Labs  Lab 10/08/19 1056  LIPASE 48   No results for input(s): AMMONIA in the last 168 hours. CBC: Recent Labs  Lab 10/08/19 1056 10/12/19 1204  WBC 8.2 5.7  NEUTROABS 6.7  --   HGB 12.9* 11.9*  HCT 40.5 37.6*  MCV 80.5 79.2*  PLT 180 326   Cardiac Enzymes: No results for input(s): CKTOTAL, CKMB, CKMBINDEX, TROPONINI in the last 168 hours. CBG: Recent Labs  Lab 10/14/19 0748 10/14/19 1159 10/14/19 1816 10/14/19 2136 10/15/19 0737  GLUCAP 151* 201* 130* 140* 148*    Iron Studies: No results for input(s): IRON, TIBC, TRANSFERRIN, FERRITIN in the last 72  hours. Studies/Results: No results found.  Medications: Infusions: . sodium chloride    . sodium chloride      Scheduled Medications: . amLODipine  10 mg Oral QHS  . atorvastatin  80 mg Oral Daily  . carvedilol  25 mg Oral BID WC  . Chlorhexidine Gluconate Cloth  6 each Topical Q0600  . cloNIDine  0.3 mg Oral BID  . famotidine  10 mg Oral Daily  . heparin  5,000 Units Subcutaneous Q8H  . hydrALAZINE  100 mg Oral TID  . insulin aspart  0-15 Units Subcutaneous TID WC  . insulin glargine  15 Units Subcutaneous QHS    have reviewed scheduled and prn medications.  Patient was not examined today because of COVID-19 infection in order to minimize exposure and to preserve PPE.  The physical examination of primary team reviewed.  Jeremy [name] 10/15/2019,10:44 AM  LOS: 7 days  Pager: ID:[phone]

## 2019-10-15 NOTE — Progress Notes (Signed)
Subjective: Patient reported that he was feeling well, no acute events overnight. He reports that he is urinating more.  He reported that he wanted to go home today.  His wife recently had a child, that is currently 23 weeks old, and needs help at home.  He also reports he has bills and things that he needs to do at home.  We discussed that we have not set up the outpatient hemodialysis yet, the renal navigator is working on this.  Discussed that it is important for him to be monitored in the hospital due to his kidney issue.  He reported that he wanted to go home, was considering signing out AMA.  We discussed the risks of signing out AMA given his kidney function including electrolyte abnormalities, cardiac issues, uremia, coma, and death.  Discussed that it would not be safe for him to be discharged today.  Patient reported that he will be thinking about leaving AMA today.  Objective:  Vital signs in last 24 hours: Vitals:   10/14/19 1800 10/14/19 1818 10/14/19 2139 10/15/19 0606  BP:  (!) 189/107 (!) 178/94 (!) 147/86  Pulse:    72  Resp:   18 11  Temp: 97.7 F (36.5 C)  99.1 F (37.3 C) 98.5 F (36.9 C)  TempSrc: Oral  Oral Oral  SpO2: 98%  98% 99%  Weight:    (!) 138.9 kg  Height:        General: Well-appearing male, no acute distress, laying in bed, R IJ HD cath in place Cardiac: Regular rate and rhythm, no murmurs rubs or gallops Pulmonary: CTA BL, no wheezing, rhonchi, rales Abdomen: Soft, nontender, nondistended Extremity: No lower extremity edema Psychiatry: Normal mood and affect  Assessment/Plan:  Active Problems:   AKI (acute kidney injury) ([place])   COVID-19 virus infection  This is a 39 year old male with a history of FSGS, membranous nephropathy, CKD sage 3, HTN, HLD, DMII, and asthma who presented with fevers, chills, body caches, decreased appetite, loss of smell and taste, abd discomfort, and dark urine, with a COVID test 6 days PTA that was positive. During  admission he was noted to have significant AKI with Cr elevated to 10.3.   Acute on chronic kidney disease: -Patient follows with Dr. Idolina Primer for membranous neprhopathy/FSGS, He was on Prograf at home.  -BL Cr was around 2.2, on admission Cr elevated to 10 with GFR 6. Fluid challanged with no improvement. Nephrology following, started on HD.  Recorded output yesterday was 1.3 L. Creatinine elevated pending from today.  Last dialysis on 2/5.  Awaiting arrangement of outpatient HD, Monday Wednesday Friday.  Unfortunately patient may leave AMA today, has family issues that he needs to deal with at home.  Discussed the risks of leaving AMA and recommended strongly to continue to be monitored inpatient. -Tunneled HD cath placed 2/5 -S/p HD #2 on 2/5 -Nephrology following, appreciate recommendations -Renal navigator working on outpatient HD -Continue holding home Prograf  COVID-19 infection: Patient currently on RA, no increased O2 requirements. No indication for remdesivir or decadron now.   HTN: -Patient on amlodipine 10 mg daily, carvedilol 25 mg daily, hydralazine 100 mg 3 times daily, aspirin 1 mg and atorvastatin 80 mg daily at home. Currently hypertensive to 175/109, keep SBP greater than 120 and chronic hypertension patient with AKI.[name]  Hydralizine increased yesterday to 100 3 times daily. -Continue hydralazine 100 mg 3 times daily -Continue amlodipine 10 mg daily -Continue Coreg 25 mg twice daily -Continue  clonidine 0.3 mg twice daily -Continue labetalol 5 mg every 4 as needed   DMII: Patient on glargine 36 units nightly and glipizide 5 mg daily at home. A1c on 3/20 was elevated to >14, not well controlled outpatient. Currently on Lantus 15 units daily and SSI. CBGs 151-209.  -Continue lantus 15 -Continue SSI  #CODE STATUS: FULL  #Dispo: Awaiting arrangement of outpatient HD Prior to Admission Living Arrangement: Home Anticipated Discharge Location: Home  Barriers to Discharge: Ongoing  medical management  Asencion Noble, MD 10/15/2019, 6:27 AM Pager: [phone]

## 2019-10-16 LAB — RENAL FUNCTION PANEL
Albumin: 1.8 g/dL — ABNORMAL LOW (ref 3.5–5.0)
Anion gap: 15 (ref 5–15)
BUN: 54 mg/dL — ABNORMAL HIGH (ref 6–20)
CO2: 23 mmol/L (ref 22–32)
Calcium: 7.6 mg/dL — ABNORMAL LOW (ref 8.9–10.3)
Chloride: 102 mmol/L (ref 98–111)
Creatinine, Ser: 11.42 mg/dL — ABNORMAL HIGH (ref 0.61–1.24)
GFR calc Af Amer: 6 mL/min — ABNORMAL LOW (ref >60)
GFR calc non Af Amer: 5 mL/min — ABNORMAL LOW (ref >60)
Glucose, Bld: 195 mg/dL — ABNORMAL HIGH (ref 70–99)
Phosphorus: 5.8 mg/dL — ABNORMAL HIGH (ref 2.5–4.6)
Potassium: 3.7 mmol/L (ref 3.5–5.1)
Sodium: 140 mmol/L (ref 135–145)

## 2019-10-16 LAB — GLUCOSE, CAPILLARY
Glucose-Capillary: 155 mg/dL — ABNORMAL HIGH (ref 70–99)
Glucose-Capillary: 129 mg/dL — ABNORMAL HIGH (ref 70–99)
Glucose-Capillary: 162 mg/dL — ABNORMAL HIGH (ref 70–99)
Glucose-Capillary: 197 mg/dL — ABNORMAL HIGH (ref 70–99)

## 2019-10-16 LAB — D-DIMER, QUANTITATIVE: D-Dimer, Quant: 10.09 ug/mL-FEU — ABNORMAL HIGH (ref 0.00–0.50)

## 2019-10-16 LAB — C-REACTIVE PROTEIN: CRP: 1.5 mg/dL — ABNORMAL HIGH (ref <1.0)

## 2019-10-16 MED ORDER — CHLORHEXIDINE GLUCONATE CLOTH 2 % EX PADS: 6.0000 | MEDICATED_PAD | Freq: Every day | CUTANEOUS | Status: DC

## 2019-10-16 NOTE — Progress Notes (Signed)
   Subjective: Patient reports that he is doing well today, no acute events overnight.  He continues to express frustration about staying in the hospital.  He is unclear why he can't get his HD set up outpatient.  Reports that he has a newborn at home that he needs to help with.  Discussed that we need to have his HD center arranged prior to him leaving.  Requested to speak with nephrology.  Objective:  Vital signs in last 24 hours: Vitals:   10/15/19 0606 10/15/19 1448 10/15/19 2118 10/16/19 0512  BP: (!) 147/86 (!) 157/96 (!) 161/92 (!) 156/102  Pulse: 72 71 74 76  Resp: 11 20 19  (!) 22  Temp: 98.5 F (36.9 C) 98.5 F (36.9 C) 98.6 F (37 C) 98.6 F (37 C)  TempSrc: Oral Oral Oral Oral  SpO2: 99% 100% 98% 98%  Weight: (!) 138.9 kg     Height:        General: Well-appearing male, no acute distress, laying in bed, R IJ HD cath in place Cardiac: RRR, no m/r/g Pulmonary: CTA BL, no wheezing, rhonchi, rales Abdomen: Soft, nontender, nondistended Extremity: No lower extremity edema Psychiatry: Normal mood and affect  Assessment/Plan:  Active Problems:   AKI (acute kidney injury) ([place])   COVID-19 virus infection  This is a 39 year old male with a history of FSGS, membranous nephropathy, CKD sage 3, HTN, HLD, DMII, and asthma who presented with fevers, chills, body aches, decreased appetite, loss of smell and taste, abd discomfort, and dark urine, with a COVID test 6 days PTA that was positive. During admission he was noted to have significant AKI with Cr elevated to 10.3.   Acute on chronic kidney disease: -Patient follows with Dr. Idolina Primer for membranous neprhopathy/FSGS, He was on Prograf at home.  -BL Cr was around 2.2, on admission Cr elevated to 10 with GFR 6. Fluid challanged with no improvement. Nephrology following, started on HD.  Recorded output yesterday was 750cc. Creatinine  Today was 11.42, up from 10.3 yesterday.  Last dialysis on 2/5.  Awaiting arrangement of outpatient  HD, Monday Wednesday Friday.  Patient expressing frustration about how long this is taking to be arranged.  Requesting to speak with nephrology about this. -Tunneled HD cath placed 2/5 -S/p HD #2 on 2/5 -Nephrology following, appreciate recommendations -Renal navigator working on outpatient HD -Continue holding home Prograf  COVID-19 infection: Patient currently on RA, no increased O2 requirements. No indication for remdesivir or decadron now.   HTN: -Patient on amlodipine 10 mg daily, carvedilol 25 mg daily, hydralazine 100 mg 3 times daily, aspirin 1 mg and atorvastatin 80 mg daily at home. Currently hypertensive to 166/98 keep SBP greater than 120 and chronic hypertension patient with AKI.  -Continue hydralazine 100 mg 3 times daily -Continue amlodipine 10 mg daily -Continue Coreg 25 mg twice daily -Continue clonidine 0.3 mg twice daily -Continue labetalol 5 mg every 4 as needed   DMII: Patient on glargine 36 units nightly and glipizide 5 mg daily at home. A1c on 3/20 was elevated to >14, not well controlled outpatient. Currently on Lantus 15 units daily and SSI. CBGs 151-209.  -Continue lantus 15 -Continue SSI  #CODE STATUS: FULL  #Dispo: Awaiting arrangement of outpatient HD Prior to Admission Living Arrangement: Home Anticipated Discharge Location: Home  Barriers to Discharge: Ongoing medical management  Asencion Noble, MD 10/16/2019, 6:31 AM Pager: [phone]

## 2019-10-16 NOTE — Progress Notes (Signed)
[facility] NEPHROLOGY PROGRESS NOTE  Assessment/ Plan: Pt is a 39 y.o. yo male with history of CKD admitted with COVID-19 infection, pneumonia, developed AKI on CKD now on dialysis.  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  Dialysis dependent. Suspect AKI related to dehydration.  UA with proteinuria.  Renal US unremarkable, urine lites consistent with dehydration.  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 -HD on 2/5 with 1300 cc UF.  Tolerated well. No sign of renal recovery with continued worsening creatinine level.  Urine output is marginal.  We will plan for MWF schedule for dialysis for AKI,  Hold off on permanent access. Arrangement for outpatient HD center. I discussed with renal navigator.  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 Lorrene Reid.    # Anemia of CKD: Hemoglobin at goal  # Secondary hyperparathyroidism: Mild elevation phosphorus, start [place].  # HTN/volume: Currently on dialysis.  Continue current antihypertensive medication.  UF during HD.  Subjective: No new event.  Chart and lab results reviewed.  Plan for next HD tomorrow.   Objective Vital signs in last 24 hours: Vitals:   10/16/19 0512 10/16/19 0753 10/16/19 0913 10/16/19 1021  BP: (!) 156/102  (!) 166/98   Pulse: 76   73  Resp: (!) 22   18  Temp: 98.6 F (37 C)     TempSrc: Oral     SpO2: 98% 99%  97%  Weight:      Height:       Weight change:   Intake/Output Summary (Last 24 hours) at 10/16/2019 1048 Last data filed at 10/16/2019 [id number] Gross per 24 hour  Intake 720 ml  Output 1260 ml  Net -540 ml       Labs: Basic Metabolic Panel: Recent Labs  Lab 10/14/19 0500 10/15/19 1258 10/16/19 0446  NA 138 139 140  K 4.3 4.1 3.7  CL 107 103 102  CO2 20* 23 23  GLUCOSE 161* 200* 195*  BUN 74* 49* 54*  CREATININE 13.15*  10.30* 11.42*  CALCIUM 7.3* 7.7* 7.6*  PHOS 6.0* 5.5* 5.8*   Liver Function Tests: Recent Labs  Lab 10/14/19 0500 10/15/19 1258 10/16/19 0446  ALBUMIN 1.7* 1.9* 1.8*   No results for input(s): LIPASE, AMYLASE in the last 168 hours. No results for input(s): AMMONIA in the last 168 hours. CBC: Recent Labs  Lab 10/12/19 1204  WBC 5.7  HGB 11.9*  HCT 37.6*  MCV 79.2*  PLT 326   Cardiac Enzymes: No results for input(s): CKTOTAL, CKMB, CKMBINDEX, TROPONINI in the last 168 hours. CBG: Recent Labs  Lab 10/15/19 0737 10/15/19 1215 10/15/19 1618 10/15/19 2124 10/16/19 0808  GLUCAP 148* 150* 207* 261* 155*    Iron Studies: No results for input(s): IRON, TIBC, TRANSFERRIN, FERRITIN in the last 72 hours. Studies/Results: No results found.  Medications: Infusions: . sodium chloride    . sodium chloride      Scheduled Medications: . amLODipine  10 mg Oral QHS  . atorvastatin  80 mg Oral Daily  . carvedilol  25 mg Oral BID WC  . Chlorhexidine Gluconate Cloth  6 each Topical Q0600  . cloNIDine  0.3 mg Oral BID  . famotidine  10 mg Oral Daily  . ferric citrate  420 mg Oral TID WC  . heparin  5,000 Units Subcutaneous Q8H  . hydrALAZINE  100 mg Oral TID  . insulin aspart  0-15 Units Subcutaneous TID WC  . insulin glargine  15 Units Subcutaneous QHS    have reviewed scheduled and prn medications.  Patient was not examined today because of COVID-19 infection in order to minimize exposure and to preserve PPE.  The physical examination of primary team reviewed.  Jeremy [name] 10/16/2019,10:48 AM  LOS: 8 days  Pager: ID:[phone]

## 2019-10-17 DIAGNOSIS — N184 Chronic kidney disease, stage 4 (severe): Secondary | ICD-10-CM | POA: Diagnosis not present

## 2019-10-17 LAB — D-DIMER, QUANTITATIVE: D-Dimer, Quant: 12.19 ug/mL-FEU — ABNORMAL HIGH (ref 0.00–0.50)

## 2019-10-17 LAB — C-REACTIVE PROTEIN: CRP: 1.1 mg/dL — ABNORMAL HIGH (ref <1.0)

## 2019-10-17 LAB — RENAL FUNCTION PANEL
Albumin: 1.8 g/dL — ABNORMAL LOW (ref 3.5–5.0)
Anion gap: 12 (ref 5–15)
BUN: 57 mg/dL — ABNORMAL HIGH (ref 6–20)
CO2: 23 mmol/L (ref 22–32)
Calcium: 7.7 mg/dL — ABNORMAL LOW (ref 8.9–10.3)
Chloride: 105 mmol/L (ref 98–111)
Creatinine, Ser: 12.14 mg/dL — ABNORMAL HIGH (ref 0.61–1.24)
GFR calc Af Amer: 5 mL/min — ABNORMAL LOW (ref >60)
GFR calc non Af Amer: 5 mL/min — ABNORMAL LOW (ref >60)
Glucose, Bld: 174 mg/dL — ABNORMAL HIGH (ref 70–99)
Phosphorus: 5.5 mg/dL — ABNORMAL HIGH (ref 2.5–4.6)
Potassium: 3.7 mmol/L (ref 3.5–5.1)
Sodium: 140 mmol/L (ref 135–145)

## 2019-10-17 LAB — CBC
HCT: 33.7 % — ABNORMAL LOW (ref 39.0–52.0)
Hemoglobin: 10.8 g/dL — ABNORMAL LOW (ref 13.0–17.0)
MCH: 25.5 pg — ABNORMAL LOW (ref 26.0–34.0)
MCHC: 32 g/dL (ref 30.0–36.0)
MCV: 79.7 fL — ABNORMAL LOW (ref 80.0–100.0)
Platelets: 403 10*3/uL — ABNORMAL HIGH (ref 150–400)
RBC: 4.23 MIL/uL (ref 4.22–5.81)
RDW: 13.8 % (ref 11.5–15.5)
WBC: 8.3 10*3/uL (ref 4.0–10.5)
nRBC: 0 % (ref 0.0–0.2)

## 2019-10-17 LAB — GLUCOSE, CAPILLARY
Glucose-Capillary: 149 mg/dL — ABNORMAL HIGH (ref 70–99)
Glucose-Capillary: 117 mg/dL — ABNORMAL HIGH (ref 70–99)

## 2019-10-17 LAB — HEPATITIS B CORE ANTIBODY, TOTAL: Hep B Core Total Ab: NONREACTIVE

## 2019-10-17 LAB — HEPATITIS B SURFACE ANTIGEN: Hepatitis B Surface Ag: NONREACTIVE

## 2019-10-17 MED ORDER — TACROLIMUS 1 MG PO CAPS: 4.0000 mg | ORAL_CAPSULE | Freq: Two times a day (BID) | ORAL | Status: DC

## 2019-10-17 MED ORDER — HEPARIN SODIUM (PORCINE) 1000 UNIT/ML IJ SOLN
INTRAMUSCULAR | Status: AC
  Administered 2019-10-17: 13:00:00 1000 [IU] via INTRAVENOUS_CENTRAL
  Filled 2019-10-17: qty 4

## 2019-10-17 MED ORDER — FERRIC CITRATE 1 GM 210 MG(FE) PO TABS: 420.0000 mg | ORAL_TABLET | Freq: Three times a day (TID) | ORAL | 0 refills | Status: AC

## 2019-10-17 NOTE — Discharge Instructions (Addendum)
COVID-19 Frequently Asked Questions COVID-19 (coronavirus disease) is an infection that is caused by a large family of viruses. Some viruses cause illness in people and others cause illness in animals like camels, cats, and bats. In some cases, the viruses that cause illness in animals can spread to humans. Where did the coronavirus come from? In December 2019, [place] told the [facility]) of several cases of lung disease (human respiratory illness). These cases were linked to an open seafood and livestock market in the city of [place]. The link to the seafood and livestock market suggests that the virus may have spread from animals to humans. However, since that first outbreak in December, the virus has also been shown to spread from person to person. What is the name of the disease and the virus? Disease name Early on, this disease was called novel coronavirus. This is because scientists determined that the disease was caused by a new (novel) respiratory virus. The World Health Organization [facility]) has now named the disease COVID-19, or coronavirus disease. Virus name The virus that causes the disease is called severe acute respiratory syndrome coronavirus 2 (SARS-CoV-2). More information on disease and virus naming World Health Organization [facility]): www.who.int/emergencies/diseases/novel-coronavirus-2019/technical-guidance/naming-the-coronavirus-disease-(covid-2019)-and-the-virus-that-causes-it Who is at risk for complications from coronavirus disease? Some people may be at higher risk for complications from coronavirus disease. This includes older adults and people who have chronic diseases, such as heart disease, diabetes, and lung disease. If you are at higher risk for complications, take these extra precautions:  Stay home as much as possible.  Avoid social gatherings and travel.  Avoid close contact with others. Stay at least 6 ft (2 m) away from others, if  possible.  Wash your hands often with soap and water for at least 20 seconds.  Avoid touching your face, mouth, nose, or eyes.  Keep supplies on hand at home, such as food, medicine, and cleaning supplies.  If you must go out in public, wear a cloth face covering or face mask. Make sure your mask covers your nose and mouth. How does coronavirus disease spread? The virus that causes coronavirus disease spreads easily from person to person (is contagious). You may catch the virus by:  Breathing in droplets from an infected person. Droplets can be spread by a person breathing, speaking, singing, coughing, or sneezing.  Touching something, like a table or a doorknob, that was exposed to the virus (contaminated) and then touching your mouth, nose, or eyes. Can I get the virus from touching surfaces or objects? There is still a lot that we do not know about the virus that causes coronavirus disease. Scientists are basing a lot of information on what they know about similar viruses, such as:  Viruses cannot generally survive on surfaces for long. They need a human body (host) to survive.  It is more likely that the virus is spread by close contact with people who are sick (direct contact), such as through: ? Shaking hands or hugging. ? Breathing in respiratory droplets that travel through the air. Droplets can be spread by a person breathing, speaking, singing, coughing, or sneezing.  It is less likely that the virus is spread when a person touches a surface or object that has the virus on it (indirect contact). The virus may be able to enter the body if the person touches a surface or object and then touches his or her face, eyes, nose, or mouth. Can a person spread the virus without having symptoms of the  disease? It may be possible for the virus to spread before a person has symptoms of the disease, but this is most likely not the main way the virus is spreading. It is more likely for the virus  to spread by being in close contact with people who are sick and breathing in the respiratory droplets spread by a person breathing, speaking, singing, coughing, or sneezing. What are the symptoms of coronavirus disease? Symptoms vary from person to person and can range from mild to severe. Symptoms may include:  Fever or chills.  Cough.  Difficulty breathing or feeling short of breath.  Headaches, body aches, or muscle aches.  Runny or stuffy (congested) nose.  Sore throat.  New loss of taste or smell.  Nausea, vomiting, or diarrhea. These symptoms can appear anywhere from 2 to 14 days after you have been exposed to the virus. Some people may not have any symptoms. If you develop symptoms, call your health care provider. People with severe symptoms may need hospital care. Should I be tested for this virus? Your health care provider will decide whether to test you based on your symptoms, history of exposure, and your risk factors. How does a health care provider test for this virus? Health care providers will collect samples to send for testing. Samples may include:  Taking a swab of fluid from the back of your nose and throat, your nose, or your throat.  Taking fluid from the lungs by having you cough up mucus (sputum) into a sterile cup.  Taking a blood sample. Is there a treatment or vaccine for this virus? Currently, there is no vaccine to prevent coronavirus disease. Also, there are no medicines like antibiotics or antivirals to treat the virus. A person who becomes sick is given supportive care, which means rest and fluids. A person may also relieve his or her symptoms by using over-the-counter medicines that treat sneezing, coughing, and runny nose. These are the same medicines that a person takes for the common cold. If you develop symptoms, call your health care provider. People with severe symptoms may need hospital care. What can I do to protect myself and my family from  this virus?     You can protect yourself and your family by taking the same actions that you would take to prevent the spread of other viruses. Take the following actions:  Wash your hands often with soap and water for at least 20 seconds. If soap and water are not available, use alcohol-based hand sanitizer.  Avoid touching your face, mouth, nose, or eyes.  Cough or sneeze into a tissue, sleeve, or elbow. Do not cough or sneeze into your hand or the air. ? If you cough or sneeze into a tissue, throw it away immediately and wash your hands.  Disinfect objects and surfaces that you frequently touch every day.  Stay away from people who are sick.  Avoid going out in public, follow guidance from your state and local health authorities.  Avoid crowded indoor spaces. Stay at least 6 ft (2 m) away from others.  If you must go out in public, wear a cloth face covering or face mask. Make sure your mask covers your nose and mouth.  Stay home if you are sick, except to get medical care. Call your health care provider before you get medical care. Your health care provider will tell you how long to stay home.  Make sure your vaccines are up to date. Ask your health care provider  what vaccines you need. What should I do if I need to travel? Follow travel recommendations from your local health authority, the CDC, and WHO. Travel information and advice  Centers for Disease Control and Prevention (CDC): BodyEditor.hu  World Health Organization [facility]): ThirdIncome.ca Know the risks and take action to protect your health  You are at higher risk of getting coronavirus disease if you are traveling to areas with an outbreak or if you are exposed to travelers from areas with an outbreak.  Wash your hands often and practice good hygiene to lower the risk of catching or spreading the virus. What should I do  if I am sick? General instructions to stop the spread of infection  Wash your hands often with soap and water for at least 20 seconds. If soap and water are not available, use alcohol-based hand sanitizer.  Cough or sneeze into a tissue, sleeve, or elbow. Do not cough or sneeze into your hand or the air.  If you cough or sneeze into a tissue, throw it away immediately and wash your hands.  Stay home unless you must get medical care. Call your health care provider or local health authority before you get medical care.  Avoid public areas. Do not take public transportation, if possible.  If you can, wear a mask if you must go out of the house or if you are in close contact with someone who is not sick. Make sure your mask covers your nose and mouth. Keep your home clean  Disinfect objects and surfaces that are frequently touched every day. This may include: ? Counters and tables. ? Doorknobs and light switches. ? Sinks and faucets. ? Electronics such as phones, remote controls, keyboards, computers, and tablets.  Wash dishes in hot, soapy water or use a dishwasher. Air-dry your dishes.  Wash laundry in hot water. Prevent infecting other household members  Let healthy household members care for children and pets, if possible. If you have to care for children or pets, wash your hands often and wear a mask.  Sleep in a different bedroom or bed, if possible.  Do not share personal items, such as razors, toothbrushes, deodorant, combs, brushes, towels, and washcloths. Where to find more information Centers for Disease Control and Prevention (CDC)  Information and news updates: https://www.butler-gonzalez.com/ World Health Organization [facility])  Information and news updates: MissExecutive.com.ee  Coronavirus health topic: https://www.castaneda.info/  Questions and answers on COVID-19:  OpportunityDebt.at  Global tracker: who.sprinklr.com American Academy of Pediatrics (AAP)  Information for families: www.healthychildren.org/English/health-issues/conditions/chest-lungs/Pages/2019-Novel-Coronavirus.aspx The coronavirus situation is changing rapidly. Check your local health authority website or the CDC and [facility] websites for updates and news. When should I contact a health care provider?  Contact your health care provider if you have symptoms of an infection, such as fever or cough, and you: ? Have been near anyone who is known to have coronavirus disease. ? Have come into contact with a person who is suspected to have coronavirus disease. ? Have traveled to an area where there is an outbreak of COVID-19. When should I get emergency medical care?  Get help right away by calling your local emergency services (911 in the [place] if you have: ? Trouble breathing. ? Pain or pressure in your chest. ? Confusion. ? Blue-tinged lips and fingernails. ? Difficulty waking from sleep. ? Symptoms that get worse. Let the emergency medical personnel know if you think you have coronavirus disease. Summary  A new respiratory virus is spreading from person to person and  causing COVID-19 (coronavirus disease).  The virus that causes COVID-19 appears to spread easily. It spreads from one person to another through droplets from breathing, speaking, singing, coughing, or sneezing.  Older adults and those with chronic diseases are at higher risk of disease. If you are at higher risk for complications, take extra precautions.  There is currently no vaccine to prevent coronavirus disease. There are no medicines, such as antibiotics or antivirals, to treat the virus.  You can protect yourself and your family by washing your hands often, avoiding touching your face, and covering your coughs and sneezes. This information is not intended to replace advice given to you  by your health care provider. Make sure you discuss any questions you have with your health care provider. Document Revised: 06/24/2019 Document Reviewed: 12/21/2018 Elsevier Patient Education  [place] Can Do to Manage Your COVID-19 Symptoms at Home If you have possible or confirmed COVID-19: 1. Stay home from work and school. And stay away from other public places. If you must go out, avoid using any kind of public transportation, ridesharing, or taxis. 2. Monitor your symptoms carefully. If your symptoms get worse, call your healthcare provider immediately. 3. Get rest and stay hydrated. 4. If you have a medical appointment, call the healthcare provider ahead of time and tell them that you have or may have COVID-19. 5. For medical emergencies, call 911 and notify the dispatch personnel that you have or may have COVID-19. 6. Cover your cough and sneezes with a tissue or use the inside of your elbow. 7. Wash your hands often with soap and water for at least 20 seconds or clean your hands with an alcohol-based hand sanitizer that contains at least 60% alcohol. 8. As much as possible, stay in a specific room and away from other people in your home. Also, you should use a separate bathroom, if available. If you need to be around other people in or outside of the home, wear a mask. 9. Avoid sharing personal items with other people in your household, like dishes, towels, and bedding. 10. Clean all surfaces that are touched often, like counters, tabletops, and doorknobs. Use household cleaning sprays or wipes according to the label instructions. [email or web address] 03/09/2019 This information is not intended to replace advice given to you by your health care provider. Make sure you discuss any questions you have with your health care provider. Document Revised: 08/11/2019 Document Reviewed: 08/11/2019 Elsevier Patient Education  [place].   Acute Kidney Injury,  Adult  Acute kidney injury is a sudden worsening of kidney function. The kidneys are organs that have several jobs. They filter the blood to remove waste products and extra fluid. They also maintain a healthy balance of minerals and hormones in the body, which helps control blood pressure and keep bones strong. With this condition, your kidneys do not do their jobs as well as they should. This condition ranges from mild to severe. Over time it may develop into long-lasting (chronic) kidney disease. Early detection and treatment may prevent acute kidney injury from developing into a chronic condition. What are the causes? Common causes of this condition include:  A problem with blood flow to the kidneys. This may be caused by: ? Low blood pressure (hypotension) or shock. ? Blood loss. ? Heart and blood vessel (cardiovascular) disease. ? Severe burns. ? Liver disease.  Direct damage to the kidneys. This may be caused by: ? Certain medicines. ? A kidney infection. ?  Poisoning. ? Being around or in contact with toxic substances. ? A surgical wound. ? A hard, direct hit to the kidney area.  A sudden blockage of urine flow. This may be caused by: ? Cancer. ? Kidney stones. ? An enlarged prostate in males. What are the signs or symptoms? Symptoms of this condition may not be obvious until the condition becomes severe. Symptoms of this condition can include:  Tiredness (lethargy), or difficulty staying awake.  Nausea or vomiting.  Swelling (edema) of the face, legs, ankles, or feet.  Problems with urination, such as: ? Abdominal pain, or pain along the side of your stomach (flank). ? Decreased urine production. ? Decrease in the force of urine flow.  Muscle twitches and cramps, especially in the legs.  Confusion or trouble concentrating.  Loss of appetite.  Fever. How is this diagnosed? This condition may be diagnosed with tests, including:  Blood tests.  Urine  tests.  Imaging tests.  A test in which a sample of tissue is removed from the kidneys to be examined under a microscope (kidney biopsy). How is this treated? Treatment for this condition depends on the cause and how severe the condition is. In mild cases, treatment may not be needed. The kidneys may heal on their own. In more severe cases, treatment will involve:  Treating the cause of the kidney injury. This may involve changing any medicines you are taking or adjusting your dosage.  Fluids. You may need specialized IV fluids to balance your body's needs.  Having a catheter placed to drain urine and prevent blockages.  Preventing problems from occurring. This may mean avoiding certain medicines or procedures that can cause further injury to the kidneys. In some cases treatment may also require:  A procedure to remove toxic wastes from the body (dialysis or continuous renal replacement therapy - CRRT).  Surgery. This may be done to repair a torn kidney, or to remove the blockage from the urinary system. Follow these instructions at home: Medicines  Take over-the-counter and prescription medicines only as told by your health care provider.  Do not take any new medicines without your health care provider's approval. Many medicines can worsen your kidney damage.  Do not take any vitamin and mineral supplements without your health care provider's approval. Many nutritional supplements can worsen your kidney damage. Lifestyle  If your health care provider prescribed changes to your diet, follow them. You may need to decrease the amount of protein you eat.  Achieve and maintain a healthy weight. If you need help with this, ask your health care provider.  Start or continue an exercise plan. Try to exercise at least 30 minutes a day, 5 days a week.  Do not use any tobacco products, such as cigarettes, chewing tobacco, and e-cigarettes. If you need help quitting, ask your health care  provider. General instructions  Keep track of your blood pressure. Report changes in your blood pressure as told by your health care provider.  Stay up to date with immunizations. Ask your health care provider which immunizations you need.  Keep all follow-up visits as told by your health care provider. This is important. Where to find more information  American Association of Kidney Patients: BombTimer.gl  National Kidney Foundation: www.kidney.[place]: https://mathis.com/  Life Options [facility]: ? www.lifeoptions.org ? www.kidneyschool.org Contact a health care provider if:  Your symptoms get worse.  You develop new symptoms. Get help right away if:  You develop symptoms of worsening kidney  disease, which include: ? Headaches. ? Abnormally dark or light skin. ? Easy bruising. ? Frequent hiccups. ? Chest pain. ? Shortness of breath. ? End of menstruation in women. ? Seizures. ? Confusion or altered mental status. ? Abdominal or back pain. ? Itchiness.  You have a fever.  Your body is producing less urine.  You have pain or bleeding when you urinate. Summary  Acute kidney injury is a sudden worsening of kidney function.  Acute kidney injury can be caused by problems with blood flow to the kidneys, direct damage to the kidneys, and sudden blockage of urine flow.  Symptoms of this condition may not be obvious until it becomes severe. Symptoms may include edema, lethargy, confusion, nausea or vomiting, and problems passing urine.  This condition can usually be diagnosed with blood tests, urine tests, and imaging tests. Sometimes a kidney biopsy is done to diagnose this condition.  Treatment for this condition often involves treating the underlying cause. It is treated with fluids, medicines, dialysis, diet changes, or surgery. This information is not intended to replace advice given to you by your health care provider. Make sure you  discuss any questions you have with your health care provider. Document Revised: 08/07/2017 Document Reviewed: 08/15/2016 Elsevier Patient Education  2020 [employer].   COVID-19 Frequently Asked Questions COVID-19 (coronavirus disease) is an infection that is caused by a large family of viruses. Some viruses cause illness in people and others cause illness in animals like camels, cats, and bats. In some cases, the viruses that cause illness in animals can spread to humans. Where did the coronavirus come from? In December 2019, [place] told the [facility]) of several cases of lung disease (human respiratory illness). These cases were linked to an open seafood and livestock market in the city of [place]. The link to the seafood and livestock market suggests that the virus may have spread from animals to humans. However, since that first outbreak in December, the virus has also been shown to spread from person to person. What is the name of the disease and the virus? Disease name Early on, this disease was called novel coronavirus. This is because scientists determined that the disease was caused by a new (novel) respiratory virus. The World Health Organization [facility] has now named the disease COVID-19, or coronavirus disease. Virus name The virus that causes the disease is called severe acute respiratory syndrome coronavirus 2 (SARS-CoV-2). More information on disease and virus naming World Health Organization [facility]): www.who.int/emergencies/diseases/novel-coronavirus-2019/technical-guidance/naming-the-coronavirus-disease-(covid-2019)-and-the-virus-that-causes-it Who is at risk for complications from coronavirus disease? Some people may be at higher risk for complications from coronavirus disease. This includes older adults and people who have chronic diseases, such as heart disease, diabetes, and lung disease. If you are at higher risk for complications, take these extra  precautions:  Stay home as much as possible.  Avoid social gatherings and travel.  Avoid close contact with others. Stay at least 6 ft (2 m) away from others, if possible.  Wash your hands often with soap and water for at least 20 seconds.  Avoid touching your face, mouth, nose, or eyes.  Keep supplies on hand at home, such as food, medicine, and cleaning supplies.  If you must go out in public, wear a cloth face covering or face mask. Make sure your mask covers your nose and mouth. How does coronavirus disease spread? The virus that causes coronavirus disease spreads easily from person to person (is contagious). You may catch the virus by:  Breathing in droplets from an infected person. Droplets can be spread by a person breathing, speaking, singing, coughing, or sneezing.  Touching something, like a table or a doorknob, that was exposed to the virus (contaminated) and then touching your mouth, nose, or eyes. Can I get the virus from touching surfaces or objects? There is still a lot that we do not know about the virus that causes coronavirus disease. Scientists are basing a lot of information on what they know about similar viruses, such as:  Viruses cannot generally survive on surfaces for long. They need a human body (host) to survive.  It is more likely that the virus is spread by close contact with people who are sick (direct contact), such as through: ? Shaking hands or hugging. ? Breathing in respiratory droplets that travel through the air. Droplets can be spread by a person breathing, speaking, singing, coughing, or sneezing.  It is less likely that the virus is spread when a person touches a surface or object that has the virus on it (indirect contact). The virus may be able to enter the body if the person touches a surface or object and then touches his or her face, eyes, nose, or mouth. Can a person spread the virus without having symptoms of the disease? It may be  possible for the virus to spread before a person has symptoms of the disease, but this is most likely not the main way the virus is spreading. It is more likely for the virus to spread by being in close contact with people who are sick and breathing in the respiratory droplets spread by a person breathing, speaking, singing, coughing, or sneezing. What are the symptoms of coronavirus disease? Symptoms vary from person to person and can range from mild to severe. Symptoms may include:  Fever or chills.  Cough.  Difficulty breathing or feeling short of breath.  Headaches, body aches, or muscle aches.  Runny or stuffy (congested) nose.  Sore throat.  New loss of taste or smell.  Nausea, vomiting, or diarrhea. These symptoms can appear anywhere from 2 to 14 days after you have been exposed to the virus. Some people may not have any symptoms. If you develop symptoms, call your health care provider. People with severe symptoms may need hospital care. Should I be tested for this virus? Your health care provider will decide whether to test you based on your symptoms, history of exposure, and your risk factors. How does a health care provider test for this virus? Health care providers will collect samples to send for testing. Samples may include:  Taking a swab of fluid from the back of your nose and throat, your nose, or your throat.  Taking fluid from the lungs by having you cough up mucus (sputum) into a sterile cup.  Taking a blood sample. Is there a treatment or vaccine for this virus? Currently, there is no vaccine to prevent coronavirus disease. Also, there are no medicines like antibiotics or antivirals to treat the virus. A person who becomes sick is given supportive care, which means rest and fluids. A person may also relieve his or her symptoms by using over-the-counter medicines that treat sneezing, coughing, and runny nose. These are the same medicines that a person takes for the  common cold. If you develop symptoms, call your health care provider. People with severe symptoms may need hospital care. What can I do to protect myself and my family from this virus?     You  can protect yourself and your family by taking the same actions that you would take to prevent the spread of other viruses. Take the following actions:  Wash your hands often with soap and water for at least 20 seconds. If soap and water are not available, use alcohol-based hand sanitizer.  Avoid touching your face, mouth, nose, or eyes.  Cough or sneeze into a tissue, sleeve, or elbow. Do not cough or sneeze into your hand or the air. ? If you cough or sneeze into a tissue, throw it away immediately and wash your hands.  Disinfect objects and surfaces that you frequently touch every day.  Stay away from people who are sick.  Avoid going out in public, follow guidance from your state and local health authorities.  Avoid crowded indoor spaces. Stay at least 6 ft (2 m) away from others.  If you must go out in public, wear a cloth face covering or face mask. Make sure your mask covers your nose and mouth.  Stay home if you are sick, except to get medical care. Call your health care provider before you get medical care. Your health care provider will tell you how long to stay home.  Make sure your vaccines are up to date. Ask your health care provider what vaccines you need. What should I do if I need to travel? Follow travel recommendations from your local health authority, the CDC, and WHO. Travel information and advice  Centers for Disease Control and Prevention (CDC): BodyEditor.hu  World Health Organization [facility]): ThirdIncome.ca Know the risks and take action to protect your health  You are at higher risk of getting coronavirus disease if you are traveling to areas with an outbreak or if you are  exposed to travelers from areas with an outbreak.  Wash your hands often and practice good hygiene to lower the risk of catching or spreading the virus. What should I do if I am sick? General instructions to stop the spread of infection  Wash your hands often with soap and water for at least 20 seconds. If soap and water are not available, use alcohol-based hand sanitizer.  Cough or sneeze into a tissue, sleeve, or elbow. Do not cough or sneeze into your hand or the air.  If you cough or sneeze into a tissue, throw it away immediately and wash your hands.  Stay home unless you must get medical care. Call your health care provider or local health authority before you get medical care.  Avoid public areas. Do not take public transportation, if possible.  If you can, wear a mask if you must go out of the house or if you are in close contact with someone who is not sick. Make sure your mask covers your nose and mouth. Keep your home clean  Disinfect objects and surfaces that are frequently touched every day. This may include: ? Counters and tables. ? Doorknobs and light switches. ? Sinks and faucets. ? Electronics such as phones, remote controls, keyboards, computers, and tablets.  Wash dishes in hot, soapy water or use a dishwasher. Air-dry your dishes.  Wash laundry in hot water. Prevent infecting other household members  Let healthy household members care for children and pets, if possible. If you have to care for children or pets, wash your hands often and wear a mask.  Sleep in a different bedroom or bed, if possible.  Do not share personal items, such as razors, toothbrushes, deodorant, combs, brushes, towels, and washcloths. Where to find  more information Centers for Disease Control and Prevention (CDC)  Information and news updates: https://www.butler-gonzalez.com/ World Health Organization [facility])  Information and news updates:  MissExecutive.com.ee  Coronavirus health topic: https://www.castaneda.info/  Questions and answers on COVID-19: OpportunityDebt.at  Global tracker: who.sprinklr.com American Academy of Pediatrics (AAP)  Information for families: www.healthychildren.org/English/health-issues/conditions/chest-lungs/Pages/2019-Novel-Coronavirus.aspx The coronavirus situation is changing rapidly. Check your local health authority website or the CDC and [facility] websites for updates and news. When should I contact a health care provider?  Contact your health care provider if you have symptoms of an infection, such as fever or cough, and you: ? Have been near anyone who is known to have coronavirus disease. ? Have come into contact with a person who is suspected to have coronavirus disease. ? Have traveled to an area where there is an outbreak of COVID-19. When should I get emergency medical care?  Get help right away by calling your local emergency services (911 in the [place] if you have: ? Trouble breathing. ? Pain or pressure in your chest. ? Confusion. ? Blue-tinged lips and fingernails. ? Difficulty waking from sleep. ? Symptoms that get worse. Let the emergency medical personnel know if you think you have coronavirus disease. Summary  A new respiratory virus is spreading from person to person and causing COVID-19 (coronavirus disease).  The virus that causes COVID-19 appears to spread easily. It spreads from one person to another through droplets from breathing, speaking, singing, coughing, or sneezing.  Older adults and those with chronic diseases are at higher risk of disease. If you are at higher risk for complications, take extra precautions.  There is currently no vaccine to prevent coronavirus disease. There are no medicines, such as antibiotics or antivirals, to treat the virus.  You can protect yourself and your family  by washing your hands often, avoiding touching your face, and covering your coughs and sneezes. This information is not intended to replace advice given to you by your health care provider. Make sure you discuss any questions you have with your health care provider. Document Revised: 06/24/2019 Document Reviewed: 12/21/2018 Elsevier Patient Education  [employer].  3 Key Steps to Take While Waiting for Your COVID-19 Test Result To help stop the spread of COVID-19, take these 3 key steps NOW while waiting for your test results: 1. Stay home and monitor your health. Stay home and monitor your health to help protect your friends, family, and others from possibly getting COVID-19 from you. Stay home and away from others:  If possible, stay away from others, especially people who are at higher risk for getting very sick from COVID-19, such as older adults and people with other medical conditions.  If you have been in contact with someone with COVID-19, stay home and away from others for 14 days after your last contact with that person.  If you have a fever, cough or other symptoms of COVID-19, stay home and away from others (except to get medical care). Monitor your health:  Watch for fever, cough, shortness of breath, or other symptoms of COVID-19. Remember, symptoms may appear 2-14 days after exposure to COVID-19 and can include: ? Fever or chills ? Cough ? Shortness of breath or difficulty breathing ? Tiredness ? Muscle or body aches ? Headache ? New loss of taste or smell ? Sore throat ? Congestion or runny nose ? Nausea or vomiting ? Diarrhea 2. Think about the people you have recently been around. If you are diagnosed with [id number], a public health  worker may call you to check on your health, discuss who you have been around, and ask where you spent time while you may have been able to spread COVID-19 to others. While you wait for your COVID-19 test result, think about everyone  you have been around recently. This will be important information to give health workers if your test is positive.  Complete the information on the back of this page to help you remember everyone you have been around. 3. Answer the phone call from the health department. If a public health worker calls you, answer the call to help slow the spread of COVID-19 in your community.  Discussions with health department staff are confidential. This means that your personal and medical information will be kept private and only shared with those who may need to know, like your health care provider.  Your name will not be shared with those you came in contact with. The health department will only notify people you were in close contact with (within 6 feet for more than 15 minutes) that they might have been exposed to COVID-19. Think about the people you have recently been around If you test positive and are diagnosed with COVID-19, someone from the health department may call to check-in on your health, discuss who you have been around, and ask where you spent time while you may have been able to spread COVID-19 to others. This form can help you think about people you have recently been around so you will be ready if a public health worker calls you. Things to think about. Have you:  Gone to work or school?  Gotten together with others (eaten out at [employer], gone out for drinks, exercised with others or gone to a gym, had friends or family over to your house, volunteered, gone to a party, pool, or park)?  Gone to a store in person (e.g., grocery store, mall)?  Gone to in-person appointments (e.g., salon, barber, doctor's or dentist's office)?  Ridden in a car with others (e.g., [employer] or [employer]) or took public transportation?  Been inside a church, synagogue, mosque or other places of worship? Who lives with  you?  ______________________________________________________________________  ______________________________________________________________________  ______________________________________________________________________  ______________________________________________________________________ Who have you been around (within 6 feet for more than 15 minutes) in the last 10 days? (You may have more people to list than the space provided. If so, write on the front of this sheet or a separate piece of paper.) Name ______________________________________________  Phone number ____________________________________  Date you last saw them _____________________________  Where you last saw them ________________________________________________ Name ______________________________________________  Phone number ____________________________________  Date you last saw them _____________________________  Where you last saw them ________________________________________________ Name ______________________________________________  Phone number ____________________________________  Date you last saw them _____________________________  Where you last saw them ________________________________________________ Name ______________________________________________  Phone number ____________________________________  Date you last saw them _____________________________  Where you last saw them ________________________________________________ Name ______________________________________________  Phone number ____________________________________  Date you last saw them _____________________________  Where you last saw them ________________________________________________ What have you done in the last 10 days with other people? Activity _____________________________________________  Location _________________________________________  Date ____________________________________________ Activity  _____________________________________________  Location _________________________________________  Date ____________________________________________ Activity _____________________________________________  Location _________________________________________  Date ____________________________________________ Activity _____________________________________________  Location _________________________________________  Date ____________________________________________ Activity _____________________________________________  Location _________________________________________  Date ____________________________________________ [email or web address] 04/04/2019 This information is not intended to replace advice given to you by your health care provider. Make sure you discuss any  questions you have with your health care provider. Document Revised: 08/11/2019 Document Reviewed: 08/11/2019 Elsevier Patient Education  2020 [employer].   COVID-19 COVID-19 is a respiratory infection that is caused by a virus called severe acute respiratory syndrome coronavirus 2 (SARS-CoV-2). The disease is also known as coronavirus disease or novel coronavirus. In some people, the virus may not cause any symptoms. In others, it may cause a serious infection. The infection can get worse quickly and can lead to complications, such as:  Pneumonia, or infection of the lungs.  Acute respiratory distress syndrome or ARDS. This is a condition in which fluid build-up in the lungs prevents the lungs from filling with air and passing oxygen into the blood.  Acute respiratory failure. This is a condition in which there is not enough oxygen passing from the lungs to the body or when carbon dioxide is not passing from the lungs out of the body.  Sepsis or septic shock. This is a serious bodily reaction to an infection.  Blood clotting problems.  Secondary infections due to bacteria or fungus.  Organ failure. This is when  your body's organs stop working. The virus that causes COVID-19 is contagious. This means that it can spread from person to person through droplets from coughs and sneezes (respiratory secretions). What are the causes? This illness is caused by a virus. You may catch the virus by:  Breathing in droplets from an infected person. Droplets can be spread by a person breathing, speaking, singing, coughing, or sneezing.  Touching something, like a table or a doorknob, that was exposed to the virus (contaminated) and then touching your mouth, nose, or eyes. What increases the risk? Risk for infection You are more likely to be infected with this virus if you:  Are within 6 feet (2 meters) of a person with COVID-19.  Provide care for or live with a person who is infected with COVID-19.  Spend time in crowded indoor spaces or live in shared housing. Risk for serious illness You are more likely to become seriously ill from the virus if you:  Are 91 years of age or older. The higher your age, the more you are at risk for serious illness.  Live in a nursing home or long-term care facility.  Have cancer.  Have a long-term (chronic) disease such as: ? Chronic lung disease, including chronic obstructive pulmonary disease or asthma. ? A long-term disease that lowers your body's ability to fight infection (immunocompromised). ? Heart disease, including heart failure, a condition in which the arteries that lead to the heart become narrow or blocked (coronary artery disease), a disease which makes the heart muscle thick, weak, or stiff (cardiomyopathy). ? Diabetes. ? Chronic kidney disease. ? Sickle cell disease, a condition in which red blood cells have an abnormal "sickle" shape. ? Liver disease.  Are obese. What are the signs or symptoms? Symptoms of this condition can range from mild to severe. Symptoms may appear any time from 2 to 14 days after being exposed to the virus. They include:  A  fever or chills.  A cough.  Difficulty breathing.  Headaches, body aches, or muscle aches.  Runny or stuffy (congested) nose.  A sore throat.  New loss of taste or smell. Some people may also have stomach problems, such as nausea, vomiting, or diarrhea. Other people may not have any symptoms of COVID-19. How is this diagnosed? This condition may be diagnosed based on:  Your signs and symptoms, especially if: ? You  live in an area with a COVID-19 outbreak. ? You recently traveled to or from an area where the virus is common. ? You provide care for or live with a person who was diagnosed with COVID-19. ? You were exposed to a person who was diagnosed with COVID-19.  A physical exam.  Lab tests, which may include: ? Taking a sample of fluid from the back of your nose and throat (nasopharyngeal fluid), your nose, or your throat using a swab. ? A sample of mucus from your lungs (sputum). ? Blood tests.  Imaging tests, which may include, X-rays, CT scan, or ultrasound. How is this treated? At present, there is no medicine to treat COVID-19. Medicines that treat other diseases are being used on a trial basis to see if they are effective against COVID-19. Your health care provider will talk with you about ways to treat your symptoms. For most people, the infection is mild and can be managed at home with rest, fluids, and over-the-counter medicines. Treatment for a serious infection usually takes places in a hospital intensive care unit (ICU). It may include one or more of the following treatments. These treatments are given until your symptoms improve.  Receiving fluids and medicines through an IV.  Supplemental oxygen. Extra oxygen is given through a tube in the nose, a face mask, or a hood.  Positioning you to lie on your stomach (prone position). This makes it easier for oxygen to get into the lungs.  Continuous positive airway pressure (CPAP) or bi-level positive airway  pressure (BPAP) machine. This treatment uses mild air pressure to keep the airways open. A tube that is connected to a motor delivers oxygen to the body.  Ventilator. This treatment moves air into and out of the lungs by using a tube that is placed in your windpipe.  Tracheostomy. This is a procedure to create a hole in the neck so that a breathing tube can be inserted.  Extracorporeal membrane oxygenation (ECMO). This procedure gives the lungs a chance to recover by taking over the functions of the heart and lungs. It supplies oxygen to the body and removes carbon dioxide. Follow these instructions at home: Lifestyle  If you are sick, stay home except to get medical care. Your health care provider will tell you how long to stay home. Call your health care provider before you go for medical care.  Rest at home as told by your health care provider.  Do not use any products that contain nicotine or tobacco, such as cigarettes, e-cigarettes, and chewing tobacco. If you need help quitting, ask your health care provider.  Return to your normal activities as told by your health care provider. Ask your health care provider what activities are safe for you. General instructions  Take over-the-counter and prescription medicines only as told by your health care provider.  Drink enough fluid to keep your urine pale yellow.  Keep all follow-up visits as told by your health care provider. This is important. How is this prevented?  There is no vaccine to help prevent COVID-19 infection. However, there are steps you can take to protect yourself and others from this virus. To protect yourself:   Do not travel to areas where COVID-19 is a risk. The areas where COVID-19 is reported change often. To identify high-risk areas and travel restrictions, check the CDC travel website: FatFares.com.br  If you live in, or must travel to, an area where COVID-19 is a risk, take precautions to avoid  infection. ?  Stay away from people who are sick. ? Wash your hands often with soap and water for 20 seconds. If soap and water are not available, use an alcohol-based hand sanitizer. ? Avoid touching your mouth, face, eyes, or nose. ? Avoid going out in public, follow guidance from your state and local health authorities. ? If you must go out in public, wear a cloth face covering or face mask. Make sure your mask covers your nose and mouth. ? Avoid crowded indoor spaces. Stay at least 6 feet (2 meters) away from others. ? Disinfect objects and surfaces that are frequently touched every day. This may include:  Counters and tables.  Doorknobs and light switches.  Sinks and faucets.  Electronics, such as phones, remote controls, keyboards, computers, and tablets. To protect others: If you have symptoms of COVID-19, take steps to prevent the virus from spreading to others.  If you think you have a COVID-19 infection, contact your health care provider right away. Tell your health care team that you think you may have a COVID-19 infection.  Stay home. Leave your house only to seek medical care. Do not use public transport.  Do not travel while you are sick.  Wash your hands often with soap and water for 20 seconds. If soap and water are not available, use alcohol-based hand sanitizer.  Stay away from other members of your household. Let healthy household members care for children and pets, if possible. If you have to care for children or pets, wash your hands often and wear a mask. If possible, stay in your own room, separate from others. Use a different bathroom.  Make sure that all people in your household wash their hands well and often.  Cough or sneeze into a tissue or your sleeve or elbow. Do not cough or sneeze into your hand or into the air.  Wear a cloth face covering or face mask. Make sure your mask covers your nose and mouth. Where to find more information  Centers for  Disease Control and Prevention: PurpleGadgets.be  World Health Organization: https://www.castaneda.info/ Contact a health care provider if:  You live in or have traveled to an area where COVID-19 is a risk and you have symptoms of the infection.  You have had contact with someone who has COVID-19 and you have symptoms of the infection. Get help right away if:  You have trouble breathing.  You have pain or pressure in your chest.  You have confusion.  You have bluish lips and fingernails.  You have difficulty waking from sleep.  You have symptoms that get worse. These symptoms may represent a serious problem that is an emergency. Do not wait to see if the symptoms will go away. Get medical help right away. Call your local emergency services (911 in the [place] Do not drive yourself to the hospital. Let the emergency medical personnel know if you think you have COVID-19. Summary  COVID-19 is a respiratory infection that is caused by a virus. It is also known as coronavirus disease or novel coronavirus. It can cause serious infections, such as pneumonia, acute respiratory distress syndrome, acute respiratory failure, or sepsis.  The virus that causes COVID-19 is contagious. This means that it can spread from person to person through droplets from breathing, speaking, singing, coughing, or sneezing.  You are more likely to develop a serious illness if you are 53 years of age or older, have a weak immune system, live in a nursing home, or have chronic disease.  There is no medicine to treat COVID-19. Your health care provider will talk with you about ways to treat your symptoms.  Take steps to protect yourself and others from infection. Wash your hands often and disinfect objects and surfaces that are frequently touched every day. Stay away from people who are sick and wear a mask if you are sick. This information is not intended to replace advice  given to you by your health care provider. Make sure you discuss any questions you have with your health care provider. Document Revised: 06/24/2019 Document Reviewed: 09/30/2018 Elsevier Patient Education  2020 [employer].  COVID-19: How to Protect Yourself and Others Know how it spreads  There is currently no vaccine to prevent coronavirus disease 2019 (COVID-19).  The best way to prevent illness is to avoid being exposed to this virus.  The virus is thought to spread mainly from person-to-person. ? Between people who are in close contact with one another (within about 6 feet). ? Through respiratory droplets produced when an infected person coughs, sneezes or talks. ? These droplets can land in the mouths or noses of people who are nearby or possibly be inhaled into the lungs. ? COVID-19 may be spread by people who are not showing symptoms. Everyone should Clean your hands often  Wash your hands often with soap and water for at least 20 seconds especially after you have been in a public place, or after blowing your nose, coughing, or sneezing.  If soap and water are not readily available, use a hand sanitizer that contains at least 60% alcohol. Cover all surfaces of your hands and rub them together until they feel dry.  Avoid touching your eyes, nose, and mouth with unwashed hands. Avoid close contact  Limit contact with others as much as possible.  Avoid close contact with people who are sick.  Put distance between yourself and other people. ? Remember that some people without symptoms may be able to spread virus. ? This is especially important for people who are at higher risk of getting very GainPain.com.cy Cover your mouth and nose with a mask when around others  You could spread COVID-19 to others even if you do not feel sick.  Everyone should wear a mask in public settings and when around people  not living in their household, especially when social distancing is difficult to maintain. ? Masks should not be placed on young children under age 77, anyone who has trouble breathing, or is unconscious, incapacitated or otherwise unable to remove the mask without assistance.  The mask is meant to protect other people in case you are infected.  Do NOT use a facemask meant for a Dietitian.  Continue to keep about 6 feet between yourself and others. The mask is not a substitute for social distancing. Cover coughs and sneezes  Always cover your mouth and nose with a tissue when you cough or sneeze or use the inside of your elbow.  Throw used tissues in the trash.  Immediately wash your hands with soap and water for at least 20 seconds. If soap and water are not readily available, clean your hands with a hand sanitizer that contains at least 60% alcohol. Clean and disinfect  Clean AND disinfect frequently touched surfaces daily. This includes tables, doorknobs, light switches, countertops, handles, desks, phones, keyboards, toilets, faucets, and sinks. RackRewards.fr  If surfaces are dirty, clean them: Use detergent or soap and water prior to disinfection.  Then, use a household disinfectant. You can  see a list of EPA-registered household disinfectants here. michellinders.com 05/11/2019 This information is not intended to replace advice given to you by your health care provider. Make sure you discuss any questions you have with your health care provider. Document Revised: 05/19/2019 Document Reviewed: 03/17/2019 Elsevier Patient Education  [place] were treated for COVID -6 and your stay was complicated by your kidney disease. You will go home with scheduled dialysis  at [place] isolation shift, TTS 12:00pm.   Follow up with your nephrologist in 1 week.   Follow up with  your PCP in 1-2 weeks.

## 2019-10-17 NOTE — Progress Notes (Signed)
Patient ID: Jeremy [name], male   DOB: 01-Feb-1981, 39 y.o.   MRN: [id number]  [facility] Progress Note   Assessment/ Plan:   1. Acute kidney Injury-nonoliguric, unfortunately without significant renal recovery for which we will continue hemodialysis for acute kidney injury as an outpatient at the [facility] dedicated shift/unit.  I have restarted his tacrolimus today to try and salvage any allograft function (low likelihood) in view of his global clinical improvement from COVID-19 infection.  He is status post [facility] placement on 10/14/2019. 2.  COVID-19 infection: Stable from a clinical standpoint without significant hypoxemia.  Plans noted in place for discharge home today for outpatient dialysis starting tomorrow. 3.  Anemia of acute illness: Hemoglobin and hematocrit currently at goal, will continue to follow as an outpatient on ESA. 4.  Secondary hyperparathyroidism: Phosphorus level slightly elevated, resume [place].  Subjective:   Inquires about going home.   Objective:   BP (!) 181/103 (BP Location: Right Arm)   Pulse 61   Temp 98.3 F (36.8 C) (Oral)   Resp 17   Ht 6\' 5"  (1.956 m)   Wt 129.5 kg   SpO2 100%   BMI 33.85 kg/m   Intake/Output Summary (Last 24 hours) at 10/17/2019 1355 Last data filed at 10/17/2019 1303 Gross per 24 hour  Intake 240 ml  Output 4166 ml  Net -3926 ml   Weight change:   Physical Exam: Gen: Resting comfortably in bed, without oxygen supplementation CVS: Pulse regular rhythm, normal rate, S1 and S2 normal Resp: Anteriorly distant breath sounds bilaterally, no rales/rhonchi.  Right IJ TDC in situ Abd: Soft, flat, obese, nontender Ext: Without lower extremity edema.  Imaging: No results found.  Labs: BMET Recent Labs  Lab 10/11/19 0313 10/12/19 1204 10/13/19 0431 10/14/19 0500 10/15/19 1258 10/16/19 0446 10/17/19 0157  NA 140 138 138 138 139 140 140  K 4.5 3.9 4.0 4.3 4.1 3.7 3.7  CL 108 105 104 107 103 102 105  CO2 19* 22 22  20* 23 23 23   GLUCOSE 150* 134* 209* 161* 200* 195* 174*  BUN 88* 63* 69* 74* 49* 54* 57*  CREATININE 12.14* 10.53* 11.90* 13.15* 10.30* 11.42* 12.14*  CALCIUM 7.0* 7.5* 7.4* 7.3* 7.7* 7.6* 7.7*  PHOS 4.8* 4.7* 5.4* 6.0* 5.5* 5.8* 5.5*   CBC Recent Labs  Lab 10/12/19 1204 10/17/19 0733  WBC 5.7 8.3  HGB 11.9* 10.8*  HCT 37.6* 33.7*  MCV 79.2* 79.7*  PLT 326 403*   Medications:    . amLODipine  10 mg Oral QHS  . atorvastatin  80 mg Oral Daily  . carvedilol  25 mg Oral BID WC  . Chlorhexidine Gluconate Cloth  6 each Topical Q0600  . Chlorhexidine Gluconate Cloth  6 each Topical Q0600  . cloNIDine  0.3 mg Oral BID  . famotidine  10 mg Oral Daily  . ferric citrate  420 mg Oral TID WC  . heparin  5,000 Units Subcutaneous Q8H  . hydrALAZINE  100 mg Oral TID  . insulin aspart  0-15 Units Subcutaneous TID WC  . insulin glargine  15 Units Subcutaneous QHS   Elmarie Shiley, MD 10/17/2019, 1:55 PM

## 2019-10-17 NOTE — Progress Notes (Signed)
Renal Navigator received notification from Dr. Posey Pronto that patient needs OP HD referral completed. Navigator spoke with patient who states he needs to go home today. Navigator encouraged him to stay in the hospital until he has been accepted for OP HD treatment and explained that he will start in the COVID isolation shift and then be transferred to his home clinic, which will most likely be [place]. Patient informed Navigator that he does not have COVID anymore because he "quarantined for like a week" before he came in to the hospital. He again stated that he needs to go home today. Navigator asked him about his urgency and explained the importance of having this piece of his discharge plan arranged before he is able to go home. Patient explained that he has a 51 week old baby at home and his girlfriend is stressed out. He states he needs to go home and help. Navigator stated understanding, but cautioned him about being around the baby while he has COVID. He again states he no longer has COVID and thinks that he was first exposed on 09/29/19. Per ED notes, it appears he was diagnosed, per patient, on 1/24 and that he tested positive on arrival to ED on 1/30. Based on this information, he will still require OP HD in isolation for a period of time at [place] will defer to the clinic to determine how long patient will treat in isolation. Referral submitted to [facility]missions. Navigator will follow closely.  Jeremy [name], [place] Renal Navigator [phone]

## 2019-10-17 NOTE — Progress Notes (Signed)
[name] to be D/C'd Home per MD order.  Discussed with the patient and all questions fully answered.  VSS, Skin clean, dry and intact without evidence of skin break down, no evidence of skin tears noted. IV catheter discontinued intact. Site without signs and symptoms of complications. Dressing and pressure applied.  An After Visit Summary was printed and given to the patient. Patient received prescription.  D/c education completed with patient/family including follow up instructions, medication list, d/c activities limitations if indicated, with other d/c instructions as indicated by MD - patient able to verbalize understanding, all questions fully answered.   Patient instructed to return to ED, call 911, or call MD for any changes in condition.   Patient escorted via [place], and D/C home via private auto.  Luci Bank 10/17/2019 3:09 PM

## 2019-10-17 NOTE — Progress Notes (Signed)
   Subjective:  Patient is sitting in chair and dressed for discharge.Reports he is feeling well and ready to get home.   Objective:  Vital signs in last 24 hours: Vitals:   10/16/19 2035 10/16/19 2037 10/17/19 0106 10/17/19 0504  BP: (!) 162/101 (!) 162/101 (!) 148/85 (!) 179/99  Pulse:  76 80 85  Resp:  (!) 26 (!) 23 (!) 22  Temp:    98.6 F (37 C)  TempSrc:    Oral  SpO2:  96% 93% 95%  Weight:      Height:        General: Well-appearing male, no acute distress Cardiac: RRR, no m/r/g Pulmonary: CTA BL, no wheezing, rhonchi, rales Abdomen: Soft, nontender, nondistended   Assessment/Plan:  Active Problems:   AKI (acute kidney injury) ([place])   COVID-19 virus infection  This is a 39 year old male with a history of FSGS, membranous nephropathy, CKD sage 3, HTN, HLD, DMII, and asthma who presented with fevers, chills, body aches, decreased appetite, loss of smell and taste, abd discomfort, and dark urine, with a COVID test 6 days PTA that was positive. During admission he was noted to have significant AKI with Cr elevated to 10.3.   Acute on chronic kidney disease:  -Patient follows with Dr. Idolina Primer for membranous neprhopathy/FSGS, He was on Prograf at home.  -BL Cr was around 2.2, on admission Cr elevated to 10 with GFR 6. Fluid challanged with no improvement. Nephrology following, started on HD.  -Tunneled Cath placed 2/2 , HD#1 on 2/3,  Last dialysis 2/5 - holding off on permanent access for now.  - Urine output improving, Creatinine not improving.  -Nephrology following, appreciate recommendations -Renal navigator arranging outpatient HD: OP HD treatment at [place] isolation shift, TTS 12:00pm  - Prograf restarted at discharge  COVID-19 infection: Patient currently on RA, no increased O2 requirements. No indication for remdesivir or decadron now.   HTN: -Patient on amlodipine 10 mg daily, carvedilol 25 mg daily, hydralazine 100 mg 3 times daily, aspirin 1 mg and  atorvastatin 80 mg daily at home. Currently hypertensive to 166/98 keep SBP greater than 120 and chronic hypertension patient with AKI.  -Continue hydralazine 100 mg 3 times daily -Continue amlodipine 10 mg daily -Continue Coreg 25 mg twice daily -Continue clonidine 0.3 mg twice daily -Continue labetalol 5 mg every 4 as needed   DMII: Patient on glargine 36 units nightly and glipizide 5 mg daily at home. A1c on 3/20 was elevated to >14, not well controlled outpatient. Currently on Lantus 15 units daily and SSI.  - at inpatient goal P:  -Continue lantus 15 -Continue SSI  #Dispo: Awaiting arrangement of outpatient HD Prior to Admission Living Arrangement: Home Anticipated Discharge Location: Home  Barriers to Discharge: discharge today  [name], MD PGY1

## 2019-10-17 NOTE — Progress Notes (Signed)
Patient has been accepted for OP HD treatment at [place] isolation shift, TTS 12:00pm. Patient called back and given information. Patient told to start tomorrow if discharged today. He will be given a schedule at his home clinic of [place] at a later date. He stated understanding and appreciation.  Patient cleared for discharge from Renal standpoint. Attending notified.  Alphonzo Cruise, [place] Renal Navigator [phone]

## 2019-10-18 DIAGNOSIS — N179 Acute kidney failure, unspecified: Secondary | ICD-10-CM | POA: Diagnosis not present

## 2019-10-18 DIAGNOSIS — N2581 Secondary hyperparathyroidism of renal origin: Secondary | ICD-10-CM | POA: Diagnosis not present

## 2019-10-18 DIAGNOSIS — Z992 Dependence on renal dialysis: Secondary | ICD-10-CM | POA: Diagnosis not present

## 2019-10-18 DIAGNOSIS — N184 Chronic kidney disease, stage 4 (severe): Secondary | ICD-10-CM | POA: Diagnosis not present

## 2019-10-18 NOTE — Discharge Summary (Signed)
Name: Jeremy [name] MRN: [id number] DOB: 12/05/1980 39 y.o. PCP: Katherine Roan, MD  Date of Admission: 10/08/2019  9:58 AM Date of Discharge: 10/17/2019 Attending Physician: No att. providers found  Discharge Diagnosis: 1. AKI 2. Covid-19 virus infection  Discharge Medications: Allergies as of 10/17/2019       Reactions   Acyclovir And Related Other (See Comments)   Unknown reaction        Medication List     STOP taking these medications    Aspir-Low 81 MG EC tablet Generic drug: aspirin   furosemide 80 MG tablet Commonly known as: LASIX   potassium chloride SA 20 MEQ tablet Commonly known as: KLOR-CON       TAKE these medications    Accu-Chek FastClix Lancets Misc Check your blood sugars 4 times a day.   Accu-Chek Guide test strip Generic drug: glucose blood USE TO CHECK BLOOD SUGAR 4 TIMES DAILY   acetaminophen 500 MG tablet Commonly known as: TYLENOL Take 1,000 mg by mouth every 6 (six) hours as needed for fever or headache (pain).   amLODipine 10 MG tablet Commonly known as: NORVASC Take 1 tablet (10 mg total) by mouth at bedtime.   atorvastatin 80 MG tablet Commonly known as: LIPITOR TAKE 1 TABLET BY MOUTH DAILY. What changed: when to take this   blood glucose meter kit and supplies Kit Dispense based on patient and insurance preference. Use up to four times daily as directed. (FOR ICD-9 250.00, 250.01).   carvedilol 25 MG tablet Commonly known as: COREG TAKE 1 TABLET (25 MG TOTAL) BY MOUTH TWICE A DAY WITH A MEAL. What changed:   how much to take  how to take this  when to take this  additional instructions   cloNIDine 0.3 MG tablet Commonly known as: CATAPRES Take 1 tablet (0.3 mg total) by mouth 2 (two) times daily.   famotidine 10 MG tablet Commonly known as: PEPCID Take 1 tablet (10 mg total) by mouth daily as needed for heartburn or indigestion. What changed: when to take this   ferric citrate 1 GM 210 MG(Fe)  tablet Commonly known as: AURYXIA Take 2 tablets (420 mg total) by mouth 3 (three) times daily with meals.   glipiZIDE 5 MG tablet Commonly known as: GLUCOTROL Take 1 tablet (5 mg total) by mouth daily.   hydrALAZINE 100 MG tablet Commonly known as: APRESOLINE TAKE 1 TABLET BY MOUTH 3 TIMES DAILY   insulin glargine 100 UNIT/ML injection Commonly known as: Lantus INJECT 36 UNITS INTO THE SKIN AT BEDTIME. What changed:   how much to take  how to take this  when to take this  additional instructions   Insulin Syringe-Needle U-100 30G X 1/2" 0.5 ML Misc Commonly known as: UltiCare Insulin Syringe USE 4 TIMES DAILY WITH INSULIN INJECTIONS. 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 What changed:   how much to take  how to take this  when to take this  reasons to take this  additional instructions   NovoLOG 100 UNIT/ML injection Generic drug: insulin aspart INJECT 15 UNITS 3 TIMES A DAY WITH MEALS INTO THE SKIN. What changed: See the new instructions.   tacrolimus 1 MG capsule Commonly known as: PROGRAF Take 4 mg by mouth 2 (two) times daily.   Unifine Pentips 32G X 4 MM Misc Generic drug: Insulin Pen Needle  USE ONCE DAILY WITH VICTOZA   Vitamin D (Ergocalciferol) 1.25 MG (50000 UNIT) Caps capsule Commonly known as: DRISDOL Take 50,000 Units by mouth See admin instructions. Take one capsule (50000 units) by mouth twice weekly - on Wednesday and Sunday        Disposition and follow-up:   Mr.Wendell Nitta was discharged from [facility] in Stable condition.  At the hospital follow up visit please address:  1.  AKI on chronic kidney disease       - Started back on Prograf at discharge       - Monitor kidney function, patient should follow up with nephrology.       - OP HD at [place]       - will need  permanent dialysis access if his kidney function does not improve             Covid-19         - mild illness, evaluate for any post [id number] complications         HTN         - monitor and adjust medications as needed          DMII         - check A1c, adjust regimen as needed.   2.  Labs / imaging needed at time of follow-up: renal function panel  3.  Pending labs/ test needing follow-up:   Follow-up Appointments: Follow-up Information     Katherine Roan, MD Follow up in 2 week(s).   Specialty: Internal Medicine Contact information: [place] [id number] [phone]         Jamal Maes, MD Follow up in 1 week(s).   Specialty: Nephrology Contact information: [place] [id number] [place] Hospital Course by problem list: 1.   Acute on chronic kidney disease:  -Patient follows with Dr. Idolina Primer for membranous neprhopathy/FSGS, He was on Prograf at home. BL Cr was around 2.2, on admission Cr elevated to 10 with GFR 6. Fluid challanged with no improvement. Nephrology following, started on HD. Tunneled Cath placed 2/2 , HD#1 on 2/3,  Last dialysis 2/5. Plan to hold off on permanent access for now. Renal navigator arranging outpatient HD: OP HD treatment at [place] isolation shift, TTS 12:00pm. Prograf restarted at discharge  COVID-19 infection: Mild illness, No indication for remdesivir or decadron  HTN:  BP hard to control. Continued current regimen.  -Continue hydralazine 100 mg 3 times daily -Continue amlodipine 10 mg daily -Continue Coreg 25 mg twice daily -Continue clonidine 0.3 mg twice daily -Continue labetalol 5 mg every 4 as needed   DMII: Patient on glargine 36 units nightly and glipizide 5 mg daily at home. A1c on 3/20 was elevated to >14, not well controlled outpatient. Inpatient regimen,  Lantus 15 units daily and SSI. Maintained at inpatient goal   Discharge Vitals:   BP (!) 181/103  (BP Location: Right Arm)   Pulse 61   Temp 98.3 F (36.8 C) (Oral)   Resp 17   Ht '6\' 5"'$  (1.956 m)   Wt 129.5 kg   SpO2 100%   BMI 33.85 kg/m   Pertinent Labs, Studies, and Procedures:  CBC Latest Ref Rng & Units 10/17/2019 10/12/2019 10/08/2019  WBC 4.0 - 10.5 K/uL 8.3 5.7 8.2  Hemoglobin 13.0 - 17.0 g/dL 10.8(L) 11.9(L) 12.9(L)  Hematocrit 39.0 - 52.0 %  33.7(L) 37.6(L) 40.5  Platelets 150 - 400 K/uL 403(H) 326 180   BMP Latest Ref Rng & Units 10/17/2019 10/16/2019 10/15/2019  Glucose 70 - 99 mg/dL 174(H) 195(H) 200(H)  BUN 6 - 20 mg/dL 57(H) 54(H) 49(H)  Creatinine 0.61 - 1.24 mg/dL 12.14(H) 11.42(H) 10.30(H)  BUN/Creat Ratio 9 - 20 - - -  Sodium 135 - 145 mmol/L 140 140 139  Potassium 3.5 - 5.1 mmol/L 3.7 3.7 4.1  Chloride 98 - 111 mmol/L 105 102 103  CO2 22 - 32 mmol/L '23 23 23  '$ Calcium 8.9 - 10.3 mg/dL 7.7(L) 7.6(L) 7.7(L)   EXAM: PORTABLE CHEST 1 VIEW  COMPARISON:  Chest radiograph 05/30/2016  FINDINGS: Monitoring leads overlie the patient. Stable cardiac and mediastinal contours. Interval development of patchy consolidation within the left mid lung. No pleural effusion or pneumothorax.  IMPRESSION: Left mid lung patchy consolidation which may represent pneumonia in the appropriate clinical setting.   Discharge Instructions: Discharge Instructions     Call MD for:  persistant nausea and vomiting   Complete by: As directed    Call MD for:  severe uncontrolled pain   Complete by: As directed    Call MD for:  temperature >100.4   Complete by: As directed    Diet - low sodium heart healthy   Complete by: As directed    Increase activity slowly   Complete by: As directed        Signed:  [name], MD PGY1

## 2019-10-19 ENCOUNTER — Telehealth: Payer: Self-pay | Admitting: Nephrology

## 2019-10-19 ENCOUNTER — Encounter: Payer: Self-pay | Admitting: Internal Medicine

## 2019-10-19 DIAGNOSIS — N184 Chronic kidney disease, stage 4 (severe): Secondary | ICD-10-CM | POA: Diagnosis not present

## 2019-10-20 DIAGNOSIS — N179 Acute kidney failure, unspecified: Secondary | ICD-10-CM | POA: Diagnosis not present

## 2019-10-20 DIAGNOSIS — N2581 Secondary hyperparathyroidism of renal origin: Secondary | ICD-10-CM | POA: Diagnosis not present

## 2019-10-20 DIAGNOSIS — N178 Other acute kidney failure: Secondary | ICD-10-CM | POA: Diagnosis not present

## 2019-10-20 DIAGNOSIS — Z992 Dependence on renal dialysis: Secondary | ICD-10-CM | POA: Diagnosis not present

## 2019-10-20 DIAGNOSIS — N184 Chronic kidney disease, stage 4 (severe): Secondary | ICD-10-CM | POA: Diagnosis not present

## 2019-10-20 LAB — HEPATITIS B E ANTIBODY: Hep B E Ab: NEGATIVE

## 2019-10-20 MED FILL — TACROLIMUS 1 MG CAPSULE: 1 | 30 days supply | Qty: 240 | Fill #0

## 2019-10-21 DIAGNOSIS — L299 Pruritus, unspecified: Secondary | ICD-10-CM | POA: Insufficient documentation

## 2019-10-21 DIAGNOSIS — N184 Chronic kidney disease, stage 4 (severe): Secondary | ICD-10-CM | POA: Diagnosis not present

## 2019-10-21 DIAGNOSIS — D689 Coagulation defect, unspecified: Secondary | ICD-10-CM | POA: Insufficient documentation

## 2019-10-21 DIAGNOSIS — N179 Acute kidney failure, unspecified: Secondary | ICD-10-CM | POA: Insufficient documentation

## 2019-10-21 DIAGNOSIS — R197 Diarrhea, unspecified: Secondary | ICD-10-CM | POA: Insufficient documentation

## 2019-10-21 DIAGNOSIS — R0602 Shortness of breath: Secondary | ICD-10-CM | POA: Insufficient documentation

## 2019-10-21 DIAGNOSIS — N2581 Secondary hyperparathyroidism of renal origin: Secondary | ICD-10-CM | POA: Insufficient documentation

## 2019-10-21 DIAGNOSIS — Z8616 Personal history of COVID-19: Secondary | ICD-10-CM | POA: Insufficient documentation

## 2019-10-22 DIAGNOSIS — N2581 Secondary hyperparathyroidism of renal origin: Secondary | ICD-10-CM | POA: Diagnosis not present

## 2019-10-22 DIAGNOSIS — N179 Acute kidney failure, unspecified: Secondary | ICD-10-CM | POA: Diagnosis not present

## 2019-10-22 DIAGNOSIS — Z992 Dependence on renal dialysis: Secondary | ICD-10-CM | POA: Diagnosis not present

## 2019-10-22 DIAGNOSIS — N184 Chronic kidney disease, stage 4 (severe): Secondary | ICD-10-CM | POA: Diagnosis not present

## 2019-10-23 DIAGNOSIS — N184 Chronic kidney disease, stage 4 (severe): Secondary | ICD-10-CM | POA: Diagnosis not present

## 2019-10-24 DIAGNOSIS — N184 Chronic kidney disease, stage 4 (severe): Secondary | ICD-10-CM | POA: Diagnosis not present

## 2019-10-24 DIAGNOSIS — R52 Pain, unspecified: Secondary | ICD-10-CM | POA: Insufficient documentation

## 2019-10-25 DIAGNOSIS — N184 Chronic kidney disease, stage 4 (severe): Secondary | ICD-10-CM | POA: Diagnosis not present

## 2019-10-26 DIAGNOSIS — D649 Anemia, unspecified: Secondary | ICD-10-CM | POA: Insufficient documentation

## 2019-10-26 DIAGNOSIS — N184 Chronic kidney disease, stage 4 (severe): Secondary | ICD-10-CM | POA: Diagnosis not present

## 2019-10-27 ENCOUNTER — Telehealth: Payer: Self-pay | Admitting: Nephrology

## 2019-10-27 DIAGNOSIS — N184 Chronic kidney disease, stage 4 (severe): Secondary | ICD-10-CM | POA: Diagnosis not present

## 2019-10-27 DIAGNOSIS — E46 Unspecified protein-calorie malnutrition: Secondary | ICD-10-CM | POA: Insufficient documentation

## 2019-10-27 NOTE — Telephone Encounter (Signed)
Transition  of care contact from inpatient facility    Date  Of Discharge 10/17/19 Date of Contact   10/19/19 Method  Of  Contact  Phone  Discussion with patient    Patient contacted to discuss transition of care from recent inpatient  hospitalization . Patient was admitted to [facility] from 10/08/19 to 10/17/19  With discharge diagnosis of Covid 19 infection // AKI on CKD with FSGS.  Discussed  Medications  With need to stop Lasix and continue Tacrolimus   Patient will have follow up with his outpatient dialysis center [facility])secondary to his covid recent diagnosis .  Discussed with him that his kidney function will be followed weekly with weekly labs drawn at kidney center to access his AKI status and possibility of renal  recovery  Off dialysis  possibly .   Noted date of  Phone contact was 10/1019 with technical difficulty( with epic system )  filing  note

## 2019-10-28 DIAGNOSIS — N184 Chronic kidney disease, stage 4 (severe): Secondary | ICD-10-CM | POA: Diagnosis not present

## 2019-10-29 DIAGNOSIS — N184 Chronic kidney disease, stage 4 (severe): Secondary | ICD-10-CM | POA: Diagnosis not present

## 2019-10-30 DIAGNOSIS — N184 Chronic kidney disease, stage 4 (severe): Secondary | ICD-10-CM | POA: Diagnosis not present

## 2019-10-31 ENCOUNTER — Telehealth (HOSPITAL_COMMUNITY): Payer: Self-pay | Admitting: *Deleted

## 2019-10-31 DIAGNOSIS — N184 Chronic kidney disease, stage 4 (severe): Secondary | ICD-10-CM | POA: Diagnosis not present

## 2019-10-31 MED FILL — NovoLOG 100 UNIT/ML SOLN: 100 | 22 days supply | Qty: 10 | Fill #3

## 2019-10-31 NOTE — Telephone Encounter (Signed)
Called Loma Sender at [phone] no answer unable to leave VM. Voicemail box is full.

## 2019-10-31 NOTE — Telephone Encounter (Signed)
-----   Message from Essie Hart May sent at 10/31/2019 10:30 AM EST ----- Regarding: Symptom Concerns Good Morning!  Ms. Bertell Maria contacted the office concerned about the PT's health and stated she has left several messages without any further communication.    Please contact her @ [phone]  Thanks!

## 2019-11-01 DIAGNOSIS — N184 Chronic kidney disease, stage 4 (severe): Secondary | ICD-10-CM | POA: Diagnosis not present

## 2019-11-02 DIAGNOSIS — N184 Chronic kidney disease, stage 4 (severe): Secondary | ICD-10-CM | POA: Diagnosis not present

## 2019-11-03 DIAGNOSIS — N184 Chronic kidney disease, stage 4 (severe): Secondary | ICD-10-CM | POA: Diagnosis not present

## 2019-11-04 DIAGNOSIS — N184 Chronic kidney disease, stage 4 (severe): Secondary | ICD-10-CM | POA: Diagnosis not present

## 2019-11-04 MED FILL — CARVEDILOL 25 MG TABLET: 25 | 30 days supply | Qty: 60 | Fill #2

## 2019-11-04 MED FILL — glipiZIDE 5 MG TABS: 5 | 90 days supply | Qty: 90 | Fill #2

## 2019-11-04 MED FILL — hydrALAZINE HCL 100 MG TABS: 100 | 30 days supply | Qty: 90 | Fill #3

## 2019-11-04 MED FILL — AMLODIPINE BESYLATE 10 MG T: 10 | 30 days supply | Qty: 30 | Fill #2

## 2019-11-04 MED FILL — cloNIDine HCL 0.3 MG TABS: 0.3 | 30 days supply | Qty: 60 | Fill #2

## 2019-11-05 DIAGNOSIS — N184 Chronic kidney disease, stage 4 (severe): Secondary | ICD-10-CM | POA: Diagnosis not present

## 2019-11-06 DIAGNOSIS — N184 Chronic kidney disease, stage 4 (severe): Secondary | ICD-10-CM | POA: Diagnosis not present

## 2019-11-07 DIAGNOSIS — N179 Acute kidney failure, unspecified: Secondary | ICD-10-CM | POA: Diagnosis not present

## 2019-11-07 DIAGNOSIS — N184 Chronic kidney disease, stage 4 (severe): Secondary | ICD-10-CM | POA: Diagnosis not present

## 2019-11-08 DIAGNOSIS — N184 Chronic kidney disease, stage 4 (severe): Secondary | ICD-10-CM | POA: Diagnosis not present

## 2019-11-09 DIAGNOSIS — N184 Chronic kidney disease, stage 4 (severe): Secondary | ICD-10-CM | POA: Diagnosis not present

## 2019-11-10 DIAGNOSIS — N184 Chronic kidney disease, stage 4 (severe): Secondary | ICD-10-CM | POA: Diagnosis not present

## 2019-11-11 ENCOUNTER — Telehealth (HOSPITAL_COMMUNITY): Payer: Self-pay | Admitting: Cardiology

## 2019-11-11 DIAGNOSIS — N184 Chronic kidney disease, stage 4 (severe): Secondary | ICD-10-CM | POA: Diagnosis not present

## 2019-11-11 NOTE — Telephone Encounter (Signed)
Patients s/o left VM on triage line with a request for an appt. Reports patient is now receiving dialysis and has problems after every session.  Returned call to patient  Patient reports he is fairly new to dialysis, after his sessions over the past few weeks, he has increased palps/flutter. was advised my dialysis center to follow up with cardiology   Add on appt available with dr Aundra Dubin 3/9 @ 240.

## 2019-11-12 DIAGNOSIS — N184 Chronic kidney disease, stage 4 (severe): Secondary | ICD-10-CM | POA: Diagnosis not present

## 2019-11-13 DIAGNOSIS — N184 Chronic kidney disease, stage 4 (severe): Secondary | ICD-10-CM | POA: Diagnosis not present

## 2019-11-14 ENCOUNTER — Telehealth (HOSPITAL_COMMUNITY): Payer: Self-pay

## 2019-11-14 DIAGNOSIS — N184 Chronic kidney disease, stage 4 (severe): Secondary | ICD-10-CM | POA: Diagnosis not present

## 2019-11-14 NOTE — Telephone Encounter (Signed)

## 2019-11-15 ENCOUNTER — Encounter (HOSPITAL_COMMUNITY): Payer: Self-pay | Admitting: Cardiology

## 2019-11-15 ENCOUNTER — Other Ambulatory Visit: Payer: Self-pay

## 2019-11-15 ENCOUNTER — Ambulatory Visit (HOSPITAL_COMMUNITY)
Admission: RE | Admit: 2019-11-15 | Discharge: 2019-11-15 | Disposition: A | Discharge status: Home / Self Care | Payer: Medicaid Other | Source: Ambulatory Visit | Attending: Cardiology | Admitting: Cardiology

## 2019-11-15 VITALS — BP 160/100 | HR 90 | Wt 288.4 lb

## 2019-11-15 DIAGNOSIS — I10 Essential (primary) hypertension: Secondary | ICD-10-CM

## 2019-11-15 DIAGNOSIS — E785 Hyperlipidemia, unspecified: Secondary | ICD-10-CM | POA: Diagnosis not present

## 2019-11-15 DIAGNOSIS — I5032 Chronic diastolic (congestive) heart failure: Secondary | ICD-10-CM | POA: Insufficient documentation

## 2019-11-15 DIAGNOSIS — Z992 Dependence on renal dialysis: Secondary | ICD-10-CM | POA: Insufficient documentation

## 2019-11-15 DIAGNOSIS — Z8616 Personal history of COVID-19: Secondary | ICD-10-CM | POA: Insufficient documentation

## 2019-11-15 DIAGNOSIS — I132 Hypertensive heart and chronic kidney disease with heart failure and with stage 5 chronic kidney disease, or end stage renal disease: Secondary | ICD-10-CM | POA: Insufficient documentation

## 2019-11-15 DIAGNOSIS — Z79899 Other long term (current) drug therapy: Secondary | ICD-10-CM | POA: Diagnosis not present

## 2019-11-15 DIAGNOSIS — E1122 Type 2 diabetes mellitus with diabetic chronic kidney disease: Secondary | ICD-10-CM | POA: Diagnosis not present

## 2019-11-15 DIAGNOSIS — I428 Other cardiomyopathies: Secondary | ICD-10-CM | POA: Diagnosis not present

## 2019-11-15 DIAGNOSIS — Z6834 Body mass index (BMI) 34.0-34.9, adult: Secondary | ICD-10-CM | POA: Insufficient documentation

## 2019-11-15 DIAGNOSIS — Z8249 Family history of ischemic heart disease and other diseases of the circulatory system: Secondary | ICD-10-CM | POA: Insufficient documentation

## 2019-11-15 DIAGNOSIS — N184 Chronic kidney disease, stage 4 (severe): Secondary | ICD-10-CM | POA: Diagnosis not present

## 2019-11-15 DIAGNOSIS — Z794 Long term (current) use of insulin: Secondary | ICD-10-CM | POA: Insufficient documentation

## 2019-11-15 DIAGNOSIS — N186 End stage renal disease: Secondary | ICD-10-CM | POA: Insufficient documentation

## 2019-11-15 MED ORDER — NIFEDIPINE ER OSMOTIC RELEASE 90 MG PO TB24: 90.0000 mg | ORAL_TABLET | Freq: Every day | ORAL | 6 refills | Status: DC

## 2019-11-15 MED FILL — NIFEdipine ER OSMOTIC RELEA: 90 | 30 days supply | Qty: 30 | Fill #0

## 2019-11-15 NOTE — Patient Instructions (Signed)
Stop Amlodipine  Start Nifedipine XL 90 mg daily  Please call our office in September to schedule your 6 month follow up   If you have any questions or concerns before your next appointment please send [place] a message through [place] or call our office at [phone].  At the [facility], you and your health needs are our priority. As part of our continuing mission to provide you with exceptional heart care, we have created designated Provider Care Teams. These Care Teams include your primary Cardiologist (physician) and Advanced Practice Providers (APPs- Physician Assistants and Nurse Practitioners) who all work together to provide you with the care you need, when you need it.   You may see any of the following providers on your designated Care Team at your next follow up: [name] Dr Glori Bickers . Dr Loralie Champagne . Darrick Grinder, NP . Lyda Jester, PA . Audry Riles, PharmD   Please be sure to bring in all your medications bottles to every appointment.

## 2019-11-16 DIAGNOSIS — N184 Chronic kidney disease, stage 4 (severe): Secondary | ICD-10-CM | POA: Diagnosis not present

## 2019-11-16 NOTE — Progress Notes (Signed)
Patient ID: Jeremy [name], male   DOB: Jun 06, 1981, 39 y.o.   MRN: [id number]    Advanced Heart Failure Clinic Note   PCP: Dr. Randell Patient Primary cardiologist: Dr. Aundra Dubin  39 y.o. with history of nonischemic cardiomyopathy, HTN, and DM presents for cardiology followup.  In 4/16, patient was seen in the ER with edema and chest pain.  The only medication he was taking was metformin.  BP was elevated.  Echo showed EF 20-25%, and Cardiolite suggested ischemia.  He had RHC/LHC in 5/16.  This showed no coronary disease and mildly elevated filling pressures.    Admitted 9/21 through 06/03/16 with marked volume overload and hypertensive crisis. Diuresed with IV lasix and transitioned to lasix 160 mg twice daily. Due to worsening renal function, nephrology consulted. Work up consistent with nephrotic syndrome. 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  Discharge creatinine 3.42.  He has been following with renal. EF improved to 55% on 9/17 echo.    Echo in 5/19 showed EF 50-55%, moderate LVH, normal RV.   Echo in 10/20 showed EF up to 55-60%, normal RV.   He was admitted in 2/21 with COVID-19 infection and developed AKI proceeding to ESRD.  He is now getting HD.   He presents today for followup of CHF and HTN.  BP is high today and weight is up from last appointment.  He says that he is tolerating HD except for some fluttering in his chest that generally occurs after HD.  No lightheadedness.  No chest pain.  No significant exertional dyspnea.  He says that Dr. Lorrene Reid hopes for some renal recovery and to get him off HD.     ECG (personally reviewed): NSR, left axis deviation, LVH with repolarization abnormality  Labs (6/16): LDL 308, HDL 46, TGs 284 Labs (7/16): K 3.5, creatinine 1.0 Labs 05/24/2015: K 3.9 Creatinine 1.43  Labs (3/17): creatinine 1.55, LDL 108, HDL 31 Labs (9/17): hemoglobin 8.3, K 4, BUN 40, creatinine 3.69, BNP 56 Labs (06/03/2016) : K 4.6  Creatinine 3.42  Labs (10/17): K 4.4, creatinine 3.44, hgb 9.4 Labs (1/18): K 4.9, creatinine 1.85 Labs (4/19): K 4.6, creatinine 2.14 Labs (8/20): K 4.4, creatinine 2.81  PMH: 1. Type II diabetes since 2006.  2. HTN since around 2010.   3. Asthma 4. Hyperlipidemia 5. Morbid obesity 6. Cardiomyopathy: Nonischemic.  Echo (5/16) with EF 20-25%, diffuse hypokinesis, grade II diastolic dysfunction.  Lexiscan Cardiolite (5/16) with EF 28%, inferior/inferolateral/apical ischemia.  RHC/LHC (5/16) with normal coronaries, mean RA 11, PA 33/21 mean 28, mean PCWP 18, CI 2.53.  - Echo (4/17): EF 50%, moderate LVH, normal RV size and systolic function. Grade IDD  - Echo (9/17): EF 55%.  - Echo (5/19): EF 50-55%, moderate LVH, normal RV size and systolic function.  - Echo (10/20): EF 55-60%, normal RV.  7. ABIs normal 8/16.  8. ESRD: AKI in 9/17 with diagnosis of nephrotic syndrome. 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  9. COVID-19 infection 2/21.   SH: Lives with girlfriend, nonsmoker, no cocaine, occasional marijuana, no ETOH.  Works as Art gallery manager.  FH: Father with CHF, died at 73.  Sister with CHF, PAD.   Review of systems complete and found to be negative unless listed in HPI.    Current Outpatient Medications  Medication Sig Dispense Refill  . Accu-Chek FastClix Lancets MISC Check your blood sugars 4 times a day. 402 each 1  . ACCU-CHEK GUIDE test strip USE TO CHECK BLOOD  SUGAR 4 TIMES DAILY 375 strip 1  . acetaminophen (TYLENOL) 500 MG tablet Take 1,000 mg by mouth every 6 (six) hours as needed for fever or headache (pain).    [name] atorvastatin (LIPITOR) 80 MG tablet Take 80 mg by mouth daily.    . blood glucose meter kit and supplies KIT Dispense based on patient and insurance preference. Use up to four times daily as directed. (FOR ICD-9 250.00, 250.01). 1 each 0  . carvedilol (COREG) 25 MG tablet Take 25 mg by mouth 2 (two) times daily with a meal.      . cloNIDine (CATAPRES) 0.3 MG tablet Take 1 tablet (0.3 mg total) by mouth 2 (two) times daily. 60 tablet 6  . famotidine (PEPCID) 10 MG tablet Take 1 tablet (10 mg total) by mouth daily as needed for heartburn or indigestion. (Patient taking differently: Take 10 mg by mouth daily. ) 90 tablet 3  . ferric citrate (AURYXIA) 1 GM 210 MG(Fe) tablet Take 2 tablets (420 mg total) by mouth 3 (three) times daily with meals. 180 tablet 0  . glipiZIDE (GLUCOTROL) 5 MG tablet Take 1 tablet (5 mg total) by mouth daily. 90 tablet 3  . hydrALAZINE (APRESOLINE) 100 MG tablet TAKE 1 TABLET BY MOUTH 3 TIMES DAILY (Patient taking differently: Take 100 mg by mouth 3 (three) times daily. ) 90 tablet 6  . insulin glargine (LANTUS) 100 UNIT/ML injection INJECT 36 UNITS INTO THE SKIN AT BEDTIME. (Patient taking differently: Inject 40 Units into the skin at bedtime. ) 30 mL 1  . Insulin Syringe-Needle U-100 (ULTICARE INSULIN SYRINGE) 30G X 1/2" 0.5 ML MISC USE 4 TIMES DAILY WITH INSULIN INJECTIONS. DIAG CODE E11.21. INSULIN DEPENDENT 360 each 1  . nitroGLYCERIN (NITROSTAT) 0.4 MG SL tablet PLACE 1 TABLET UNDER THE TONGUE EVERY 5 MINUTES AS NEEDED FOR CHEST PAIN. (Patient taking differently: Place 0.4 mg under the tongue every 5 (five) minutes as needed for chest pain. ) 25 tablet 3  . NOVOLOG 100 UNIT/ML injection INJECT 15 UNITS 3 TIMES A DAY WITH MEALS INTO THE SKIN. (Patient taking differently: Inject 20 Units into the skin 3 (three) times daily with meals. ) 40 mL 3  . UNIFINE PENTIPS 32G X 4 MM MISC USE ONCE DAILY WITH VICTOZA 100 each 1  . Vitamin D, Ergocalciferol, (DRISDOL) 1.25 MG (50000 UNIT) CAPS capsule Take 50,000 Units by mouth See admin instructions. Take one capsule (50000 units) by mouth twice weekly - on Wednesday and Sunday    . NIFEdipine (PROCARDIA XL/NIFEDICAL-XL) 90 MG 24 hr tablet Take 1 tablet (90 mg total) by mouth daily. 30 tablet 6   No current facility-administered medications for this  encounter.   Vitals:   11/15/19 1443  BP: (!) 160/100  Pulse: 90  SpO2: 99%  Weight: 130.8 kg (288 lb 6.4 oz)   Wt Readings from Last 3 Encounters:  11/15/19 130.8 kg (288 lb 6.4 oz)  10/17/19 129.5 kg (285 lb 7.9 oz)  06/28/19 126.6 kg (279 lb 3.2 oz)   Physical Exam General: NAD Neck: No JVD, no thyromegaly or thyroid nodule.  Lungs: Clear to auscultation bilaterally with normal respiratory effort. CV: Nondisplaced PMI.  Heart regular S1/S2, no S3/S4, no murmur.  No peripheral edema.  No carotid bruit.  Normal pedal pulses.  Abdomen: Soft, nontender, no hepatosplenomegaly, no distention.  Skin: Intact without lesions or rashes.  Neurologic: Alert and oriented x 3.  Psych: Normal affect. Extremities: No clubbing or cyanosis.  HEENT: Normal.  Assessment/Plan:  1. Chronic diastolic CHF: Nonischemic cardiomyopathy, ?from long-standing HTN.  EF was 20-25% in 2006 but EF has improved.  Echo in 10/20 showed EF 55-60% with normal RV systolic function. NYHA class II.  He is not volume overloaded on exam, volume now controlled by HD.    - Continue Coreg 25 mg bid.  2.  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   - Continue atorvastatin.  3. HTN: BP still high.   - Continue Coreg, clonidine, and hydralazine at current doses.  - Will see if he does better with nifedipine XR than amlodipine. Stop amlodipine, start nifedipine XR 90 mg daily.   4. 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 Now with ESRD on HD after COVID-19 infection, but per patient renal has some hope that function may recover.   Followup in 6 months.   Loralie Champagne, MD  11/16/2019

## 2019-11-17 DIAGNOSIS — N184 Chronic kidney disease, stage 4 (severe): Secondary | ICD-10-CM | POA: Diagnosis not present

## 2019-11-18 DIAGNOSIS — N184 Chronic kidney disease, stage 4 (severe): Secondary | ICD-10-CM | POA: Diagnosis not present

## 2019-11-19 DIAGNOSIS — N184 Chronic kidney disease, stage 4 (severe): Secondary | ICD-10-CM | POA: Diagnosis not present

## 2019-11-20 DIAGNOSIS — N184 Chronic kidney disease, stage 4 (severe): Secondary | ICD-10-CM | POA: Diagnosis not present

## 2019-11-21 DIAGNOSIS — N184 Chronic kidney disease, stage 4 (severe): Secondary | ICD-10-CM | POA: Diagnosis not present

## 2019-11-22 DIAGNOSIS — N184 Chronic kidney disease, stage 4 (severe): Secondary | ICD-10-CM | POA: Diagnosis not present

## 2019-11-23 DIAGNOSIS — N184 Chronic kidney disease, stage 4 (severe): Secondary | ICD-10-CM | POA: Diagnosis not present

## 2019-11-24 DIAGNOSIS — N179 Acute kidney failure, unspecified: Secondary | ICD-10-CM | POA: Diagnosis not present

## 2019-11-24 DIAGNOSIS — N184 Chronic kidney disease, stage 4 (severe): Secondary | ICD-10-CM | POA: Diagnosis not present

## 2019-11-24 DIAGNOSIS — E1129 Type 2 diabetes mellitus with other diabetic kidney complication: Secondary | ICD-10-CM | POA: Diagnosis not present

## 2019-11-25 DIAGNOSIS — N184 Chronic kidney disease, stage 4 (severe): Secondary | ICD-10-CM | POA: Diagnosis not present

## 2019-11-26 DIAGNOSIS — N184 Chronic kidney disease, stage 4 (severe): Secondary | ICD-10-CM | POA: Diagnosis not present

## 2019-11-27 DIAGNOSIS — N184 Chronic kidney disease, stage 4 (severe): Secondary | ICD-10-CM | POA: Diagnosis not present

## 2019-11-28 DIAGNOSIS — N184 Chronic kidney disease, stage 4 (severe): Secondary | ICD-10-CM | POA: Diagnosis not present

## 2019-11-29 ENCOUNTER — Other Ambulatory Visit (HOSPITAL_COMMUNITY): Payer: Self-pay | Admitting: Nephrology

## 2019-11-29 DIAGNOSIS — N184 Chronic kidney disease, stage 4 (severe): Secondary | ICD-10-CM | POA: Diagnosis not present

## 2019-11-29 MED FILL — CALCIUM ACETATE 667 MG GELC: 667 | 30 days supply | Qty: 90 | Fill #0

## 2019-11-30 DIAGNOSIS — N184 Chronic kidney disease, stage 4 (severe): Secondary | ICD-10-CM | POA: Diagnosis not present

## 2019-12-01 ENCOUNTER — Other Ambulatory Visit: Payer: Self-pay | Admitting: Internal Medicine

## 2019-12-01 DIAGNOSIS — N184 Chronic kidney disease, stage 4 (severe): Secondary | ICD-10-CM | POA: Diagnosis not present

## 2019-12-01 MED FILL — NovoLOG 100 UNIT/ML SOLN: 100 | 22 days supply | Qty: 10 | Fill #4

## 2019-12-01 MED FILL — VIT D2 1.25 MG (50,000 UNIT: 1.25 MG | 32 days supply | Qty: 9 | Fill #3

## 2019-12-01 MED FILL — ATORVASTATIN 80 MG TABLET: 80 | 90 days supply | Qty: 90 | Fill #0

## 2019-12-02 DIAGNOSIS — N184 Chronic kidney disease, stage 4 (severe): Secondary | ICD-10-CM | POA: Diagnosis not present

## 2019-12-03 DIAGNOSIS — N184 Chronic kidney disease, stage 4 (severe): Secondary | ICD-10-CM | POA: Diagnosis not present

## 2019-12-04 DIAGNOSIS — N184 Chronic kidney disease, stage 4 (severe): Secondary | ICD-10-CM | POA: Diagnosis not present

## 2019-12-05 DIAGNOSIS — N184 Chronic kidney disease, stage 4 (severe): Secondary | ICD-10-CM | POA: Diagnosis not present

## 2019-12-06 DIAGNOSIS — N184 Chronic kidney disease, stage 4 (severe): Secondary | ICD-10-CM | POA: Diagnosis not present

## 2019-12-07 DIAGNOSIS — N184 Chronic kidney disease, stage 4 (severe): Secondary | ICD-10-CM | POA: Diagnosis not present

## 2019-12-08 DIAGNOSIS — N184 Chronic kidney disease, stage 4 (severe): Secondary | ICD-10-CM | POA: Diagnosis not present

## 2019-12-09 DIAGNOSIS — N184 Chronic kidney disease, stage 4 (severe): Secondary | ICD-10-CM | POA: Diagnosis not present

## 2019-12-10 DIAGNOSIS — N184 Chronic kidney disease, stage 4 (severe): Secondary | ICD-10-CM | POA: Diagnosis not present

## 2019-12-11 DIAGNOSIS — N184 Chronic kidney disease, stage 4 (severe): Secondary | ICD-10-CM | POA: Diagnosis not present

## 2019-12-12 DIAGNOSIS — N184 Chronic kidney disease, stage 4 (severe): Secondary | ICD-10-CM | POA: Diagnosis not present

## 2019-12-12 MED FILL — cloNIDine HCL 0.3 MG TABS: 0.3 | 30 days supply | Qty: 60 | Fill #3

## 2019-12-13 DIAGNOSIS — N184 Chronic kidney disease, stage 4 (severe): Secondary | ICD-10-CM | POA: Diagnosis not present

## 2019-12-14 DIAGNOSIS — N184 Chronic kidney disease, stage 4 (severe): Secondary | ICD-10-CM | POA: Diagnosis not present

## 2019-12-15 DIAGNOSIS — N184 Chronic kidney disease, stage 4 (severe): Secondary | ICD-10-CM | POA: Diagnosis not present

## 2019-12-16 DIAGNOSIS — N184 Chronic kidney disease, stage 4 (severe): Secondary | ICD-10-CM | POA: Diagnosis not present

## 2019-12-17 DIAGNOSIS — N184 Chronic kidney disease, stage 4 (severe): Secondary | ICD-10-CM | POA: Diagnosis not present

## 2019-12-18 DIAGNOSIS — N184 Chronic kidney disease, stage 4 (severe): Secondary | ICD-10-CM | POA: Diagnosis not present

## 2019-12-19 ENCOUNTER — Other Ambulatory Visit: Payer: Self-pay | Admitting: Internal Medicine

## 2019-12-19 DIAGNOSIS — E1121 Type 2 diabetes mellitus with diabetic nephropathy: Secondary | ICD-10-CM

## 2019-12-19 DIAGNOSIS — N184 Chronic kidney disease, stage 4 (severe): Secondary | ICD-10-CM | POA: Diagnosis not present

## 2019-12-19 MED FILL — hydrALAZINE HCL 100 MG TABS: 100 | 30 days supply | Qty: 90 | Fill #4

## 2019-12-19 MED FILL — NovoLOG 100 UNIT/ML SOLN: 100 | 22 days supply | Qty: 10 | Fill #5

## 2019-12-19 MED FILL — CARVEDILOL 25 MG TABLET: 25 | 30 days supply | Qty: 60 | Fill #3

## 2019-12-19 MED FILL — NIFEdipine ER OSMOTIC RELEA: 90 | 30 days supply | Qty: 30 | Fill #1

## 2019-12-19 MED FILL — LANTUS 100 UNITS/ML VIAL: 100 | 55 days supply | Qty: 20 | Fill #0

## 2019-12-20 DIAGNOSIS — N184 Chronic kidney disease, stage 4 (severe): Secondary | ICD-10-CM | POA: Diagnosis not present

## 2019-12-21 DIAGNOSIS — N184 Chronic kidney disease, stage 4 (severe): Secondary | ICD-10-CM | POA: Diagnosis not present

## 2019-12-22 DIAGNOSIS — N184 Chronic kidney disease, stage 4 (severe): Secondary | ICD-10-CM | POA: Diagnosis not present

## 2019-12-23 DIAGNOSIS — N184 Chronic kidney disease, stage 4 (severe): Secondary | ICD-10-CM | POA: Diagnosis not present

## 2019-12-24 DIAGNOSIS — N184 Chronic kidney disease, stage 4 (severe): Secondary | ICD-10-CM | POA: Diagnosis not present

## 2019-12-25 DIAGNOSIS — N184 Chronic kidney disease, stage 4 (severe): Secondary | ICD-10-CM | POA: Diagnosis not present

## 2019-12-26 DIAGNOSIS — N184 Chronic kidney disease, stage 4 (severe): Secondary | ICD-10-CM | POA: Diagnosis not present

## 2019-12-27 DIAGNOSIS — N184 Chronic kidney disease, stage 4 (severe): Secondary | ICD-10-CM | POA: Diagnosis not present

## 2019-12-28 DIAGNOSIS — N184 Chronic kidney disease, stage 4 (severe): Secondary | ICD-10-CM | POA: Diagnosis not present

## 2019-12-29 DIAGNOSIS — N184 Chronic kidney disease, stage 4 (severe): Secondary | ICD-10-CM | POA: Diagnosis not present

## 2019-12-30 ENCOUNTER — Encounter: Payer: Self-pay | Admitting: *Deleted

## 2019-12-30 DIAGNOSIS — E1129 Type 2 diabetes mellitus with other diabetic kidney complication: Secondary | ICD-10-CM | POA: Diagnosis not present

## 2019-12-30 DIAGNOSIS — N179 Acute kidney failure, unspecified: Secondary | ICD-10-CM | POA: Diagnosis not present

## 2019-12-30 DIAGNOSIS — N184 Chronic kidney disease, stage 4 (severe): Secondary | ICD-10-CM | POA: Diagnosis not present

## 2019-12-31 DIAGNOSIS — N184 Chronic kidney disease, stage 4 (severe): Secondary | ICD-10-CM | POA: Diagnosis not present

## 2020-01-01 DIAGNOSIS — N184 Chronic kidney disease, stage 4 (severe): Secondary | ICD-10-CM | POA: Diagnosis not present

## 2020-01-02 DIAGNOSIS — N184 Chronic kidney disease, stage 4 (severe): Secondary | ICD-10-CM | POA: Diagnosis not present

## 2020-01-02 MED FILL — VIT D2 1.25 MG (50,000 UNIT: 1.25 MG | 32 days supply | Qty: 9 | Fill #4

## 2020-01-03 DIAGNOSIS — N184 Chronic kidney disease, stage 4 (severe): Secondary | ICD-10-CM | POA: Diagnosis not present

## 2020-01-04 DIAGNOSIS — N184 Chronic kidney disease, stage 4 (severe): Secondary | ICD-10-CM | POA: Diagnosis not present

## 2020-01-05 DIAGNOSIS — N184 Chronic kidney disease, stage 4 (severe): Secondary | ICD-10-CM | POA: Diagnosis not present

## 2020-01-06 DIAGNOSIS — N184 Chronic kidney disease, stage 4 (severe): Secondary | ICD-10-CM | POA: Diagnosis not present

## 2020-01-07 DIAGNOSIS — N184 Chronic kidney disease, stage 4 (severe): Secondary | ICD-10-CM | POA: Diagnosis not present

## 2020-01-08 DIAGNOSIS — N184 Chronic kidney disease, stage 4 (severe): Secondary | ICD-10-CM | POA: Diagnosis not present

## 2020-01-09 DIAGNOSIS — N184 Chronic kidney disease, stage 4 (severe): Secondary | ICD-10-CM | POA: Diagnosis not present

## 2020-01-10 DIAGNOSIS — N184 Chronic kidney disease, stage 4 (severe): Secondary | ICD-10-CM | POA: Diagnosis not present

## 2020-01-11 DIAGNOSIS — N184 Chronic kidney disease, stage 4 (severe): Secondary | ICD-10-CM | POA: Diagnosis not present

## 2020-01-11 MED FILL — AMOXICILLIN 500 MG CAPSULE: 500 | 7 days supply | Qty: 21 | Fill #0

## 2020-01-11 MED FILL — metroNIDAZOLE 500 MG TABS: 500 | 10 days supply | Qty: 30 | Fill #0

## 2020-01-12 DIAGNOSIS — N184 Chronic kidney disease, stage 4 (severe): Secondary | ICD-10-CM | POA: Diagnosis not present

## 2020-01-13 DIAGNOSIS — N184 Chronic kidney disease, stage 4 (severe): Secondary | ICD-10-CM | POA: Diagnosis not present

## 2020-01-14 DIAGNOSIS — N184 Chronic kidney disease, stage 4 (severe): Secondary | ICD-10-CM | POA: Diagnosis not present

## 2020-01-14 MED FILL — cloNIDine HCL 0.3 MG TABS: 0.3 | 30 days supply | Qty: 60 | Fill #0

## 2020-01-15 DIAGNOSIS — N184 Chronic kidney disease, stage 4 (severe): Secondary | ICD-10-CM | POA: Diagnosis not present

## 2020-01-16 DIAGNOSIS — N184 Chronic kidney disease, stage 4 (severe): Secondary | ICD-10-CM | POA: Diagnosis not present

## 2020-01-17 ENCOUNTER — Other Ambulatory Visit: Payer: Self-pay | Admitting: *Deleted

## 2020-01-17 DIAGNOSIS — N184 Chronic kidney disease, stage 4 (severe): Secondary | ICD-10-CM

## 2020-01-18 DIAGNOSIS — N184 Chronic kidney disease, stage 4 (severe): Secondary | ICD-10-CM | POA: Diagnosis not present

## 2020-01-19 DIAGNOSIS — N184 Chronic kidney disease, stage 4 (severe): Secondary | ICD-10-CM | POA: Diagnosis not present

## 2020-01-20 ENCOUNTER — Ambulatory Visit (HOSPITAL_COMMUNITY): Payer: Medicaid Other | Attending: Vascular Surgery

## 2020-01-20 ENCOUNTER — Encounter: Payer: Medicaid Other | Admitting: Vascular Surgery

## 2020-01-20 ENCOUNTER — Other Ambulatory Visit (HOSPITAL_COMMUNITY): Payer: Medicaid Other

## 2020-01-20 DIAGNOSIS — N184 Chronic kidney disease, stage 4 (severe): Secondary | ICD-10-CM | POA: Diagnosis not present

## 2020-01-21 DIAGNOSIS — N184 Chronic kidney disease, stage 4 (severe): Secondary | ICD-10-CM | POA: Diagnosis not present

## 2020-01-22 DIAGNOSIS — N184 Chronic kidney disease, stage 4 (severe): Secondary | ICD-10-CM | POA: Diagnosis not present

## 2020-01-23 DIAGNOSIS — N184 Chronic kidney disease, stage 4 (severe): Secondary | ICD-10-CM | POA: Diagnosis not present

## 2020-01-24 DIAGNOSIS — N184 Chronic kidney disease, stage 4 (severe): Secondary | ICD-10-CM | POA: Diagnosis not present

## 2020-01-25 DIAGNOSIS — N184 Chronic kidney disease, stage 4 (severe): Secondary | ICD-10-CM | POA: Diagnosis not present

## 2020-01-26 DIAGNOSIS — N184 Chronic kidney disease, stage 4 (severe): Secondary | ICD-10-CM | POA: Diagnosis not present

## 2020-01-26 DIAGNOSIS — N179 Acute kidney failure, unspecified: Secondary | ICD-10-CM | POA: Diagnosis not present

## 2020-01-26 DIAGNOSIS — D509 Iron deficiency anemia, unspecified: Secondary | ICD-10-CM | POA: Diagnosis not present

## 2020-01-27 DIAGNOSIS — N184 Chronic kidney disease, stage 4 (severe): Secondary | ICD-10-CM | POA: Diagnosis not present

## 2020-01-28 DIAGNOSIS — N184 Chronic kidney disease, stage 4 (severe): Secondary | ICD-10-CM | POA: Diagnosis not present

## 2020-01-29 DIAGNOSIS — N184 Chronic kidney disease, stage 4 (severe): Secondary | ICD-10-CM | POA: Diagnosis not present

## 2020-01-30 DIAGNOSIS — N184 Chronic kidney disease, stage 4 (severe): Secondary | ICD-10-CM | POA: Diagnosis not present

## 2020-01-31 DIAGNOSIS — N184 Chronic kidney disease, stage 4 (severe): Secondary | ICD-10-CM | POA: Diagnosis not present

## 2020-02-01 DIAGNOSIS — N184 Chronic kidney disease, stage 4 (severe): Secondary | ICD-10-CM | POA: Diagnosis not present

## 2020-02-02 DIAGNOSIS — N184 Chronic kidney disease, stage 4 (severe): Secondary | ICD-10-CM | POA: Diagnosis not present

## 2020-02-03 DIAGNOSIS — N184 Chronic kidney disease, stage 4 (severe): Secondary | ICD-10-CM | POA: Diagnosis not present

## 2020-02-04 DIAGNOSIS — N184 Chronic kidney disease, stage 4 (severe): Secondary | ICD-10-CM | POA: Diagnosis not present

## 2020-02-05 DIAGNOSIS — N184 Chronic kidney disease, stage 4 (severe): Secondary | ICD-10-CM | POA: Diagnosis not present

## 2020-02-06 DIAGNOSIS — N184 Chronic kidney disease, stage 4 (severe): Secondary | ICD-10-CM | POA: Diagnosis not present

## 2020-02-07 DIAGNOSIS — N184 Chronic kidney disease, stage 4 (severe): Secondary | ICD-10-CM | POA: Diagnosis not present

## 2020-02-08 DIAGNOSIS — N184 Chronic kidney disease, stage 4 (severe): Secondary | ICD-10-CM | POA: Diagnosis not present

## 2020-02-09 DIAGNOSIS — N184 Chronic kidney disease, stage 4 (severe): Secondary | ICD-10-CM | POA: Diagnosis not present

## 2020-02-10 DIAGNOSIS — N184 Chronic kidney disease, stage 4 (severe): Secondary | ICD-10-CM | POA: Diagnosis not present

## 2020-02-11 DIAGNOSIS — N184 Chronic kidney disease, stage 4 (severe): Secondary | ICD-10-CM | POA: Diagnosis not present

## 2020-02-12 DIAGNOSIS — N184 Chronic kidney disease, stage 4 (severe): Secondary | ICD-10-CM | POA: Diagnosis not present

## 2020-02-13 ENCOUNTER — Other Ambulatory Visit: Payer: Self-pay | Admitting: Internal Medicine

## 2020-02-13 DIAGNOSIS — N184 Chronic kidney disease, stage 4 (severe): Secondary | ICD-10-CM | POA: Diagnosis not present

## 2020-02-13 DIAGNOSIS — Z794 Long term (current) use of insulin: Secondary | ICD-10-CM

## 2020-02-14 ENCOUNTER — Other Ambulatory Visit: Payer: Self-pay | Admitting: Internal Medicine

## 2020-02-14 ENCOUNTER — Other Ambulatory Visit (HOSPITAL_COMMUNITY): Payer: Self-pay | Admitting: Internal Medicine

## 2020-02-14 DIAGNOSIS — N184 Chronic kidney disease, stage 4 (severe): Secondary | ICD-10-CM | POA: Diagnosis not present

## 2020-02-14 DIAGNOSIS — E1121 Type 2 diabetes mellitus with diabetic nephropathy: Secondary | ICD-10-CM

## 2020-02-14 NOTE — Telephone Encounter (Signed)
Now that patient is ESRD and cbg seems better controlled will d/c. Needs follow up appointment with me

## 2020-02-15 ENCOUNTER — Encounter: Payer: Self-pay | Admitting: Internal Medicine

## 2020-02-15 DIAGNOSIS — E1121 Type 2 diabetes mellitus with diabetic nephropathy: Secondary | ICD-10-CM

## 2020-02-15 DIAGNOSIS — Z794 Long term (current) use of insulin: Secondary | ICD-10-CM

## 2020-02-15 DIAGNOSIS — N184 Chronic kidney disease, stage 4 (severe): Secondary | ICD-10-CM | POA: Diagnosis not present

## 2020-02-15 NOTE — Telephone Encounter (Signed)
Refill not appropriate see note from 6/7

## 2020-02-16 DIAGNOSIS — N184 Chronic kidney disease, stage 4 (severe): Secondary | ICD-10-CM | POA: Diagnosis not present

## 2020-02-17 DIAGNOSIS — N179 Acute kidney failure, unspecified: Secondary | ICD-10-CM | POA: Diagnosis not present

## 2020-02-17 DIAGNOSIS — N184 Chronic kidney disease, stage 4 (severe): Secondary | ICD-10-CM | POA: Diagnosis not present

## 2020-02-17 DIAGNOSIS — T8249XA Other complication of vascular dialysis catheter, initial encounter: Secondary | ICD-10-CM | POA: Diagnosis not present

## 2020-02-18 DIAGNOSIS — N184 Chronic kidney disease, stage 4 (severe): Secondary | ICD-10-CM | POA: Diagnosis not present

## 2020-02-19 DIAGNOSIS — N184 Chronic kidney disease, stage 4 (severe): Secondary | ICD-10-CM | POA: Diagnosis not present

## 2020-02-20 DIAGNOSIS — N184 Chronic kidney disease, stage 4 (severe): Secondary | ICD-10-CM | POA: Diagnosis not present

## 2020-02-21 DIAGNOSIS — N184 Chronic kidney disease, stage 4 (severe): Secondary | ICD-10-CM | POA: Diagnosis not present

## 2020-02-22 DIAGNOSIS — N184 Chronic kidney disease, stage 4 (severe): Secondary | ICD-10-CM | POA: Diagnosis not present

## 2020-02-23 DIAGNOSIS — N184 Chronic kidney disease, stage 4 (severe): Secondary | ICD-10-CM | POA: Diagnosis not present

## 2020-02-24 DIAGNOSIS — N184 Chronic kidney disease, stage 4 (severe): Secondary | ICD-10-CM | POA: Diagnosis not present

## 2020-02-24 MED FILL — hydrALAZINE HCL 100 MG TABS: 100 | 30 days supply | Qty: 90 | Fill #5

## 2020-02-24 MED FILL — CARVEDILOL 25 MG TABLET: 25 | 30 days supply | Qty: 60 | Fill #0

## 2020-02-25 DIAGNOSIS — N184 Chronic kidney disease, stage 4 (severe): Secondary | ICD-10-CM | POA: Diagnosis not present

## 2020-02-26 DIAGNOSIS — N184 Chronic kidney disease, stage 4 (severe): Secondary | ICD-10-CM | POA: Diagnosis not present

## 2020-02-27 ENCOUNTER — Telehealth: Payer: Self-pay

## 2020-02-27 ENCOUNTER — Encounter: Payer: Medicaid Other | Admitting: Internal Medicine

## 2020-02-27 DIAGNOSIS — N184 Chronic kidney disease, stage 4 (severe): Secondary | ICD-10-CM | POA: Diagnosis not present

## 2020-02-27 NOTE — Telephone Encounter (Signed)
Patient will have to reschedule his appointment, with his new doctor because he missed his appointment  Today with Dr, Shan Levans.Patient will call us back the first of July for an appointment [place], [place] C6/21/20214:21 PM

## 2020-02-28 DIAGNOSIS — N184 Chronic kidney disease, stage 4 (severe): Secondary | ICD-10-CM | POA: Diagnosis not present

## 2020-02-28 MED FILL — ATORVASTATIN 80 MG TABLET: 80 | 90 days supply | Qty: 90 | Fill #1

## 2020-02-29 ENCOUNTER — Emergency Department (HOSPITAL_COMMUNITY): Payer: Medicaid Other

## 2020-02-29 ENCOUNTER — Other Ambulatory Visit: Payer: Self-pay

## 2020-02-29 ENCOUNTER — Emergency Department (HOSPITAL_COMMUNITY)
Admission: EM | Admit: 2020-02-29 | Discharge: 2020-03-01 | Disposition: A | Discharge status: Home / Self Care | Payer: Medicaid Other | Attending: Emergency Medicine | Admitting: Emergency Medicine

## 2020-02-29 ENCOUNTER — Encounter (HOSPITAL_COMMUNITY): Payer: Self-pay | Admitting: Emergency Medicine

## 2020-02-29 DIAGNOSIS — E1121 Type 2 diabetes mellitus with diabetic nephropathy: Secondary | ICD-10-CM

## 2020-02-29 DIAGNOSIS — Z79899 Other long term (current) drug therapy: Secondary | ICD-10-CM | POA: Insufficient documentation

## 2020-02-29 DIAGNOSIS — F1210 Cannabis abuse, uncomplicated: Secondary | ICD-10-CM | POA: Insufficient documentation

## 2020-02-29 DIAGNOSIS — R569 Unspecified convulsions: Secondary | ICD-10-CM

## 2020-02-29 DIAGNOSIS — N184 Chronic kidney disease, stage 4 (severe): Secondary | ICD-10-CM | POA: Diagnosis not present

## 2020-02-29 DIAGNOSIS — R456 Violent behavior: Secondary | ICD-10-CM | POA: Diagnosis not present

## 2020-02-29 DIAGNOSIS — R410 Disorientation, unspecified: Secondary | ICD-10-CM | POA: Diagnosis not present

## 2020-02-29 DIAGNOSIS — I5042 Chronic combined systolic (congestive) and diastolic (congestive) heart failure: Secondary | ICD-10-CM | POA: Diagnosis not present

## 2020-02-29 DIAGNOSIS — R000 Tachycardia, unspecified: Secondary | ICD-10-CM | POA: Diagnosis not present

## 2020-02-29 DIAGNOSIS — E1165 Type 2 diabetes mellitus with hyperglycemia: Secondary | ICD-10-CM | POA: Diagnosis not present

## 2020-02-29 DIAGNOSIS — J45909 Unspecified asthma, uncomplicated: Secondary | ICD-10-CM | POA: Diagnosis not present

## 2020-02-29 DIAGNOSIS — R404 Transient alteration of awareness: Secondary | ICD-10-CM | POA: Diagnosis not present

## 2020-02-29 DIAGNOSIS — G9340 Encephalopathy, unspecified: Secondary | ICD-10-CM | POA: Diagnosis not present

## 2020-02-29 DIAGNOSIS — J3489 Other specified disorders of nose and nasal sinuses: Secondary | ICD-10-CM | POA: Diagnosis not present

## 2020-02-29 DIAGNOSIS — F1310 Sedative, hypnotic or anxiolytic abuse, uncomplicated: Secondary | ICD-10-CM | POA: Insufficient documentation

## 2020-02-29 DIAGNOSIS — R4182 Altered mental status, unspecified: Secondary | ICD-10-CM | POA: Insufficient documentation

## 2020-02-29 DIAGNOSIS — E1122 Type 2 diabetes mellitus with diabetic chronic kidney disease: Secondary | ICD-10-CM | POA: Insufficient documentation

## 2020-02-29 DIAGNOSIS — Z794 Long term (current) use of insulin: Secondary | ICD-10-CM | POA: Insufficient documentation

## 2020-02-29 DIAGNOSIS — I130 Hypertensive heart and chronic kidney disease with heart failure and stage 1 through stage 4 chronic kidney disease, or unspecified chronic kidney disease: Secondary | ICD-10-CM | POA: Diagnosis not present

## 2020-02-29 DIAGNOSIS — J9811 Atelectasis: Secondary | ICD-10-CM | POA: Diagnosis not present

## 2020-02-29 HISTORY — DX: Unspecified convulsions: R56.9

## 2020-02-29 LAB — CBC WITH DIFFERENTIAL/PLATELET
Abs Immature Granulocytes: 0.05 10*3/uL (ref 0.00–0.07)
Basophils Absolute: 0 10*3/uL (ref 0.0–0.1)
Basophils Relative: 0 %
Eosinophils Absolute: 0 10*3/uL (ref 0.0–0.5)
Eosinophils Relative: 0 %
HCT: 42 % (ref 39.0–52.0)
Hemoglobin: 13.4 g/dL (ref 13.0–17.0)
Immature Granulocytes: 0 %
Lymphocytes Relative: 5 %
Lymphs Abs: 0.7 10*3/uL (ref 0.7–4.0)
MCH: 25.9 pg — ABNORMAL LOW (ref 26.0–34.0)
MCHC: 31.9 g/dL (ref 30.0–36.0)
MCV: 81.2 fL (ref 80.0–100.0)
Monocytes Absolute: 0.5 10*3/uL (ref 0.1–1.0)
Monocytes Relative: 4 %
Neutro Abs: 11.7 10*3/uL — ABNORMAL HIGH (ref 1.7–7.7)
Neutrophils Relative %: 91 %
Platelets: 296 10*3/uL (ref 150–400)
RBC: 5.17 MIL/uL (ref 4.22–5.81)
RDW: 14.8 % (ref 11.5–15.5)
WBC: 13 10*3/uL — ABNORMAL HIGH (ref 4.0–10.5)
nRBC: 0 % (ref 0.0–0.2)

## 2020-02-29 LAB — CBG MONITORING, ED: Glucose-Capillary: 397 mg/dL — ABNORMAL HIGH (ref 70–99)

## 2020-02-29 NOTE — ED Provider Notes (Signed)
[facility] EMERGENCY DEPARTMENT Provider Note   CSN: [id number] Arrival date & time: 02/29/20  2215     History Chief Complaint  Patient presents with  . Altered Mental Status    Jeremy [name] is a 39 y.o. male.  The history is provided by the patient and medical records.  Altered Mental Status   39 year old male with history of anemia, asthma, hypertension, hyperlipidemia, membranous glomerulonephritis, chronic kidney disease progressed to end-stage renal disease on hemodialysis, diabetes, presenting to the ED with altered mental status.  Per EMS, sister reported that patient complained of not feeling well pretty much all day today.  No specific complaints.  States she tried to get him in the car to bring him to the ED for evaluation but he began "talking out of his head" and possibly had a seizure.  He has no history of seizure.  Patient was agitated with EMS and was given 10 mg IV Versed along with 200 cc bolus.  Patient is awake and conversant on arrival here.  He denies any pain.  He states "I just feel bad".  He has not noticed any fevers or other infectious symptoms.  He denies illicit drug use or overuse of prescription medications.  He does report he has continued with dialysis, scheduled Monday, Wednesday, and Friday.  Past Medical History:  Diagnosis Date  . Abscess of left groin   . Acute blood loss anemia 11/11/2013  . Anemia in chronic kidney disease (CKD)   . Asthma   . Boil of scrotum 11/21/2015  . Chest pain    a. 01/2015 Lexiscan MV: EF 28%, inferior, inferolateral, apical ischemia;  b. 01/2015 Cath: nl cors, PCWP 18 mmHg, CO 9.38 L/min, CI 3.53 L/min/m^2.  . Essential hypertension   . Hyperlipidemia   . Membranous glomerulonephritis   . Morbid obesity ([place])   . Nonischemic cardiomyopathy ([place])    a. 01/2015 Echo: EF 20-25%, diff HK, Gr 2 DD, Triv AI, mildly dil LA and Ao root.  . Type II diabetes mellitus ([place])    a. 01/2015 HbA1c = 8.9.     Patient Active Problem List   Diagnosis Date Noted  . COVID-19 virus infection   . AKI (acute kidney injury) ([place]) 10/08/2019  . High risk heterosexual behavior 12/06/2018  . Gluteal pain 09/23/2018  . Mass of soft tissue of face 09/23/2018  . Need for prophylactic vaccination against Streptococcus pneumoniae (pneumococcus) 03/30/2018  . Onychomycosis due to dermatophyte 03/29/2018  . Routine screening for STI (sexually transmitted infection) 01/26/2017  . Anemia, iron deficiency 07/18/2016  . Anemia in chronic kidney disease 07/18/2016  . Abscess of superficial perineal space 06/12/2016  . CKD (chronic kidney disease) stage 4, GFR 15-29 ml/min (HCC) 05/29/2016  . Pneumococcal vaccination indicated 04/12/2016  . Suspected sleep apnea 05/24/2015  . Chronic combined systolic and diastolic CHF (congestive heart failure) ([place]) 03/01/2015  . Nonischemic cardiomyopathy ([name])   . Chest pain 01/09/2015  . Traumatic hemopneumothorax 11/11/2013  . Asthma   . Hyperlipidemia 03/31/2012  . Obesity, Class I, BMI 30-34.9 03/21/2011  . Essential hypertension 03/21/2011  . Gastroesophageal reflux disease 03/21/2011  . Type 2 diabetes mellitus with diabetic nephropathy ([place]) 02/20/2011    Past Surgical History:  Procedure Laterality Date  . CARDIAC CATHETERIZATION N/A 02/01/2015   Procedure: Right/Left Heart Cath and Coronary Angiography;  Surgeon: Josue Hector, MD;  Location: [facility];  Service: Cardiovascular;  Laterality: N/A;  . IR FLUORO GUIDE CV LINE RIGHT  10/11/2019  . IR US GUIDE VASC ACCESS RIGHT  10/11/2019  . THORACOTOMY Left 11/08/2013   Procedure: LEFT THORACOTOMY;  Surgeon: Ivin Poot, MD;  Location: [facility] OR;  Service: Thoracic;  Laterality: Left;       Family History  Problem Relation Age of Onset  . Diabetes Mellitus II Mother        died @ 6.  . Gastric cancer Mother   . CAD Father        died @ 72.  [name] Heart attack Father   . Congestive Heart Failure  Father   . Diabetes Mellitus II Sister   . CAD Sister        s/p PCI - age 40.    Social History   Tobacco Use  . Smoking status: Never Smoker  . Smokeless tobacco: Never Used  Substance Use Topics  . Alcohol use: No    Alcohol/week: 0.0 standard drinks  . Drug use: Yes    Types: Marijuana    Comment: smoked marijuana 2-3 x/wk.    Home Medications Prior to Admission medications   Medication Sig Start Date End Date Taking? Authorizing Provider  Accu-Chek FastClix Lancets MISC Check your blood sugars 4 times a day. 08/01/19   Katherine Roan, MD  ACCU-CHEK GUIDE test strip USE TO CHECK BLOOD SUGAR 4 TIMES DAILY 08/01/19   Katherine Roan, MD  acetaminophen (TYLENOL) 500 MG tablet Take 1,000 mg by mouth every 6 (six) hours as needed for fever or headache (pain).    [provider]  atorvastatin (LIPITOR) 80 MG tablet TAKE 1 TABLET BY MOUTH DAILY. 12/01/19   Katherine Roan, MD  blood glucose meter kit and supplies KIT Dispense based on patient and insurance preference. Use up to four times daily as directed. (FOR ICD-9 250.00, 250.01). 07/21/18   Katherine Roan, MD  carvedilol (COREG) 25 MG tablet Take 25 mg by mouth 2 (two) times daily with a meal.    [provider]  cloNIDine (CATAPRES) 0.3 MG tablet Take 1 tablet (0.3 mg total) by mouth 2 (two) times daily. 08/01/19   [name], MD  famotidine (PEPCID) 10 MG tablet Take 1 tablet (10 mg total) by mouth daily as needed for heartburn or indigestion. Patient taking differently: Take 10 mg by mouth daily.  07/21/18   Katherine Roan, MD  glipiZIDE (GLUCOTROL) 5 MG tablet Take 1 tablet (5 mg total) by mouth daily. 02/11/19   Katherine Roan, MD  hydrALAZINE (APRESOLINE) 100 MG tablet TAKE 1 TABLET BY MOUTH 3 TIMES DAILY Patient taking differently: Take 100 mg by mouth 3 (three) times daily.  06/28/19   [name], MD  Insulin Syringe-Needle U-100 Flossie Buffy INSULIN SYRINGE) 30G X 1/2" 0.5 ML  MISC USE 4 TIMES DAILY WITH INSULIN INJECTIONS. 02/14/20   Katherine Roan, MD  LANTUS 100 UNIT/ML injection INJECT 36 UNITS UNDER THE SKIN AT BEDTIME 02/14/20   Katherine Roan, MD  NIFEdipine (PROCARDIA XL/NIFEDICAL-XL) 90 MG 24 hr tablet Take 1 tablet (90 mg total) by mouth daily. 11/15/19   [name], MD  nitroGLYCERIN (NITROSTAT) 0.4 MG SL tablet PLACE 1 TABLET UNDER THE TONGUE EVERY 5 MINUTES AS NEEDED FOR CHEST PAIN. Patient taking differently: Place 0.4 mg under the tongue every 5 (five) minutes as needed for chest pain.  06/28/19   [name], MD  NOVOLOG 100 UNIT/ML injection INJECT 15 UNITS 3 TIMES A DAY WITH MEALS INTO THE SKIN.  Patient taking differently: Inject 20 Units into the skin 3 (three) times daily with meals.  08/01/19   Katherine Roan, MD  UNIFINE PENTIPS 32G X 4 MM MISC USE ONCE DAILY WITH VICTOZA 08/01/19   Katherine Roan, MD  Vitamin D, Ergocalciferol, (DRISDOL) 1.25 MG (50000 UNIT) CAPS capsule Take 50,000 Units by mouth See admin instructions. Take one capsule (50000 units) by mouth twice weekly - on Wednesday and Sunday    [provider]    Allergies    Acyclovir and related  Review of Systems   Review of Systems  Neurological:       AMS  All other systems reviewed and are negative.   Physical Exam Updated Vital Signs BP (!) 221/101 (BP Location: Left Arm)   Pulse (!) 109   Temp 99.2 F (37.3 C) (Rectal)   Resp 20   SpO2 99%   Physical Exam Vitals and nursing note reviewed.  Constitutional:      Appearance: He is well-developed.     Comments: Smells strongly of marijuana  HENT:     Head: Normocephalic and atraumatic.  Eyes:     Conjunctiva/sclera: Conjunctivae normal.     Pupils: Pupils are equal, round, and reactive to light.  Cardiovascular:     Rate and Rhythm: Normal rate and regular rhythm.     Heart sounds: Normal heart sounds.  Pulmonary:     Effort: Pulmonary effort is normal.     Breath sounds: Normal  breath sounds. No wheezing or rhonchi.  Chest:     Comments: Dialysis catheter right chest wall, clean without signs of infection Abdominal:     General: Bowel sounds are normal.     Palpations: Abdomen is soft.  Musculoskeletal:        General: Normal range of motion.     Cervical back: Normal range of motion.  Skin:    General: Skin is warm and dry.  Neurological:     Mental Status: He is alert and oriented to person, place, and time.     Comments: Awake, alert, able to answer questions and follow commands when asked, spontaneously moving all 4 extremities     ED Results / Procedures / Treatments   Labs (all labs ordered are listed, but only abnormal results are displayed) Labs Reviewed  CBC WITH DIFFERENTIAL/PLATELET - Abnormal; Notable for the following components:      Result Value   WBC 13.0 (*)    MCH 25.9 (*)    Neutro Abs 11.7 (*)    All other components within normal limits  RAPID URINE DRUG SCREEN, HOSP PERFORMED - Abnormal; Notable for the following components:   Benzodiazepines POSITIVE (*)    Tetrahydrocannabinol POSITIVE (*)    All other components within normal limits  COMPREHENSIVE METABOLIC PANEL - Abnormal; Notable for the following components:   CO2 17 (*)    Glucose, Bld 456 (*)    BUN 55 (*)    Creatinine, Ser 7.08 (*)    Calcium 8.7 (*)    Albumin 2.8 (*)    GFR calc non Af Amer 9 (*)    GFR calc Af Amer 10 (*)    All other components within normal limits  URINALYSIS, ROUTINE W REFLEX MICROSCOPIC - Abnormal; Notable for the following components:   Color, Urine STRAW (*)    Glucose, UA >=500 (*)    Hgb urine dipstick MODERATE (*)    Protein, ur >=300 (*)    Bacteria, UA RARE (*)  All other components within normal limits  CBG MONITORING, ED - Abnormal; Notable for the following components:   Glucose-Capillary 397 (*)    All other components within normal limits  CBG MONITORING, ED - Abnormal; Notable for the following components:    Glucose-Capillary 415 (*)    All other components within normal limits  CBG MONITORING, ED - Abnormal; Notable for the following components:   Glucose-Capillary 372 (*)    All other components within normal limits  ETHANOL    EKG None  Radiology DG Chest Port 1 View  Result Date: 02/29/2020 CLINICAL DATA:  Generalized malaise. EXAM: PORTABLE CHEST 1 VIEW COMPARISON:  October 08, 2019 FINDINGS: A right-sided venous catheter is seen with its distal tip noted just beyond the junction of the superior vena cava and right atrium. Very mild atelectasis is seen within the left lung base. There is no evidence of a pleural effusion or pneumothorax. The heart size and mediastinal contours are within normal limits. The visualized skeletal structures are unremarkable. IMPRESSION: No active disease. Electronically Signed   By: Virgina Norfolk M.D.   On: 02/29/2020 23:01    Procedures Procedures (including critical care time)  CRITICAL CARE Performed by: Larene Pickett   Total critical care time: 40 minutes  Critical care time was exclusive of separately billable procedures and treating other patients.  Critical care was necessary to treat or prevent imminent or life-threatening deterioration.  Critical care was time spent personally by me on the following activities: development of treatment plan with patient and/or surrogate as well as nursing, discussions with consultants, evaluation of patient's response to treatment, examination of patient, obtaining history from patient or surrogate, ordering and performing treatments and interventions, ordering and review of laboratory studies, ordering and review of radiographic studies, pulse oximetry and re-evaluation of patient's condition.   Medications Ordered in ED Medications  sodium chloride 0.9 % bolus 500 mL (0 mLs Intravenous Stopped 03/01/20 0220)  insulin aspart (novoLOG) injection 2 Units (2 Units Intravenous Given 03/01/20 0057)  alum &  mag hydroxide-simeth (MAALOX/MYLANTA) 200-200-20 MG/5ML suspension 30 mL (30 mLs Oral Given 03/01/20 0230)    And  lidocaine (XYLOCAINE) 2 % viscous mouth solution 15 mL (15 mLs Oral Given 03/01/20 0230)  ondansetron (ZOFRAN) injection 4 mg (4 mg Intravenous Given 03/01/20 0230)    ED Course  I have reviewed the triage vital signs and the nursing notes.  Pertinent labs & imaging results that were available during my care of the patient were reviewed by me and considered in my medical decision making (see chart for details).    MDM Rules/Calculators/A&P    39 y.o. M presenting to the ED with AMS.  Reportedly he complained of feeling poorly all day and then had a questionable seizure prior to EMS transport.  He was agitated with them, given 32m IV versed.  Patient is awake, answering questions on arrival but is still groggy.  He denies any pain.  States has been feeling fine up until today.  Labs pending along with chest x-ray and CT of the head as he has no history of prior seizure.  11:33 PM Sister has arrived at bedside.  States apparently patient had been complaining of headache x3 days and had some vomiting this morning per friends.  Sister drove to his house to pick him up today to bring him here for evaluation, states they got about 2 mins from his house when he started talking nonsense, arms threw up, began shaking and  wedged himself between the front and back seats.  States this lasted a minute or two but he was out of it for quite a while afterwards.  Sister reports he did start waking up in the ambulance but still not back to baseline.  Currently, patient seems at baseline.  He is comfortable on stretcher, asking for water.  He is able to answer questions and follow commands appropriately.  Given ice chips, sister notified work-up pending.  States he just stopped his dialysis last week and has been doing overall ok, however is non-compliant with his medications and does not check his glucose  at home.  Labs as above-- does have hyperglycemia without elevated anion gap, no ketones in urine.   Bicarb is low, however suspect this is from his CKD.  CT head negative.  CXR clear.  Discussed with attending physician, Dr. Sedonia Small-- given his ESRD status, given small 500cc bolus and 2 units IV insulin.  If CBG improving and remains at baseline, can likely discharge home with close PCP and neurology follow-up.  4:53 AM CBG improving from prior, down from 456 to 372.  Suspect this will continue to trend down.  On chart review, it also appears patient has not been unable to get a refill of his glipizide which may be contributing to his hyperglycemia.  He has been able to tolerate oral fluids here without vomiting.  He remains awake, alert, appropriately oriented to baseline.  BP is elevated, however has not taken his BP meds in 2 days, wife will give to him as soon as they return home.  Will refer to neurology given possible seizure today.  His UDS is + for [facility] and smells strongly of marijuana here in ED so may be substance related.  Nonetheless, he was placed on driving precautions.  Advised to keep a close eye on his blood sugar, if continuing to run high he will need to return to the ED for further evaluation.  Encourage close follow-up with PCP.  Return here for any new/acute changes.  Patient seen and evaluated with attending physician, Dr. Sedonia Small, who evaluated patient and agrees with plan of care.  Final Clinical Impression(s) / ED Diagnoses Final diagnoses:  Altered mental status, unspecified altered mental status type  Seizure-like activity ([place])    Rx / DC Orders ED Discharge Orders         Ordered    glipiZIDE (GLUCOTROL) 5 MG tablet  Daily     Discontinue  Reprint     03/01/20 0455    Ambulatory referral to Neurology     Discontinue  Reprint    Comments: An appointment is requested in approximately: 2 weeks New onset seizure   03/01/20 0459           Larene Pickett,  PA-C 03/01/20 0505    Maudie Flakes, MD 03/02/20 [phone]

## 2020-02-29 NOTE — ED Triage Notes (Signed)
Pt BIB [facility], per pt's sister pt has been "feeling bad all day". Pt was attempting to be pt to ED when he began "talking out of his head" and had seizure like activity. Per EMS, pt agitated, given 10mg  versed and 200ccNS. Pt has a dialysis port, family reports he is not currently on dialysis. EMS VS: BP 183/100, HR 140

## 2020-03-01 ENCOUNTER — Emergency Department (HOSPITAL_COMMUNITY): Payer: Medicaid Other

## 2020-03-01 DIAGNOSIS — J3489 Other specified disorders of nose and nasal sinuses: Secondary | ICD-10-CM | POA: Diagnosis not present

## 2020-03-01 DIAGNOSIS — G9340 Encephalopathy, unspecified: Secondary | ICD-10-CM | POA: Diagnosis not present

## 2020-03-01 DIAGNOSIS — N184 Chronic kidney disease, stage 4 (severe): Secondary | ICD-10-CM | POA: Diagnosis not present

## 2020-03-01 LAB — URINALYSIS, ROUTINE W REFLEX MICROSCOPIC
Bilirubin Urine: NEGATIVE
Glucose, UA: >=500 mg/dL — AB
Ketones, ur: NEGATIVE mg/dL
Leukocytes,Ua: NEGATIVE
Nitrite: NEGATIVE
Protein, ur: >=300 mg/dL — AB
Specific Gravity, Urine: 1.016 (ref 1.005–1.030)
pH: 5 (ref 5.0–8.0)

## 2020-03-01 LAB — CBG MONITORING, ED
Glucose-Capillary: 372 mg/dL — ABNORMAL HIGH (ref 70–99)
Glucose-Capillary: 415 mg/dL — ABNORMAL HIGH (ref 70–99)

## 2020-03-01 LAB — COMPREHENSIVE METABOLIC PANEL
ALT: 17 U/L (ref 0–44)
AST: 20 U/L (ref 15–41)
Albumin: 2.8 g/dL — ABNORMAL LOW (ref 3.5–5.0)
Alkaline Phosphatase: 68 U/L (ref 38–126)
Anion gap: 15 (ref 5–15)
BUN: 55 mg/dL — ABNORMAL HIGH (ref 6–20)
CO2: 17 mmol/L — ABNORMAL LOW (ref 22–32)
Calcium: 8.7 mg/dL — ABNORMAL LOW (ref 8.9–10.3)
Chloride: 109 mmol/L (ref 98–111)
Creatinine, Ser: 7.08 mg/dL — ABNORMAL HIGH (ref 0.61–1.24)
GFR calc Af Amer: 10 mL/min — ABNORMAL LOW (ref >60)
GFR calc non Af Amer: 9 mL/min — ABNORMAL LOW (ref >60)
Glucose, Bld: 456 mg/dL — ABNORMAL HIGH (ref 70–99)
Potassium: 3.5 mmol/L (ref 3.5–5.1)
Sodium: 141 mmol/L (ref 135–145)
Total Bilirubin: 0.7 mg/dL (ref 0.3–1.2)
Total Protein: 7.2 g/dL (ref 6.5–8.1)

## 2020-03-01 LAB — RAPID URINE DRUG SCREEN, HOSP PERFORMED
Amphetamines: NOT DETECTED
Barbiturates: NOT DETECTED
Benzodiazepines: POSITIVE — AB
Cocaine: NOT DETECTED
Opiates: NOT DETECTED
Tetrahydrocannabinol: POSITIVE — AB

## 2020-03-01 LAB — ETHANOL: Alcohol, Ethyl (B): <10 mg/dL (ref <10)

## 2020-03-01 MED ORDER — SODIUM CHLORIDE 0.9 % IV BOLUS
500.0000 mL | Freq: Once | INTRAVENOUS | Status: AC
  Administered 2020-03-01: 500 mL via INTRAVENOUS

## 2020-03-01 MED ORDER — ALUM & MAG HYDROXIDE-SIMETH 200-200-20 MG/5ML PO SUSP
30.0000 mL | Freq: Once | ORAL | Status: AC
  Administered 2020-03-01: 30 mL via ORAL
  Filled 2020-03-01: qty 30

## 2020-03-01 MED ORDER — GLIPIZIDE 5 MG PO TABS: 5.0000 mg | ORAL_TABLET | Freq: Every day | ORAL | 0 refills | Status: DC

## 2020-03-01 MED ORDER — ONDANSETRON HCL 4 MG/2ML IJ SOLN
4.0000 mg | Freq: Once | INTRAMUSCULAR | Status: AC
  Administered 2020-03-01: 4 mg via INTRAVENOUS
  Filled 2020-03-01: qty 2

## 2020-03-01 MED ORDER — LIDOCAINE VISCOUS HCL 2 % MT SOLN
15.0000 mL | Freq: Once | OROMUCOSAL | Status: AC
  Administered 2020-03-01: 15 mL via ORAL
  Filled 2020-03-01: qty 15

## 2020-03-01 MED ORDER — INSULIN ASPART 100 UNIT/ML ~~LOC~~ SOLN
2.0000 [IU] | Freq: Once | SUBCUTANEOUS | Status: AC
  Administered 2020-03-01: 2 [IU] via INTRAVENOUS

## 2020-03-01 MED FILL — glipiZIDE 5 MG TABS: 5 | 30 days supply | Qty: 30 | Fill #0

## 2020-03-01 NOTE — ED Notes (Signed)
CBG Results of 372 reported to [place], Therapist, sports.

## 2020-03-01 NOTE — ED Notes (Signed)
Pt has had (4) 12 ounce glasses of water; no episodes of vomiting noted.

## 2020-03-01 NOTE — Discharge Instructions (Signed)
I have written you refills for your glipizide.  Please take as directed along with all your other medications.  Continue to monitor your blood sugars at home. Follow-up with your primary care doctor-- I would call in the morning to get this scheduled. We are also referring you to a neurologist given seizure like activity that occurred today-- they should contact you with appt soon.  Do not drive until seen and cleared by neurologist. Return to the ED for new or worsening symptoms.

## 2020-03-02 ENCOUNTER — Encounter (HOSPITAL_COMMUNITY): Payer: Medicaid Other

## 2020-03-02 ENCOUNTER — Encounter: Payer: Self-pay | Admitting: Internal Medicine

## 2020-03-02 ENCOUNTER — Encounter: Payer: Medicaid Other | Admitting: Vascular Surgery

## 2020-03-02 DIAGNOSIS — N184 Chronic kidney disease, stage 4 (severe): Secondary | ICD-10-CM | POA: Diagnosis not present

## 2020-03-03 DIAGNOSIS — N184 Chronic kidney disease, stage 4 (severe): Secondary | ICD-10-CM | POA: Diagnosis not present

## 2020-03-04 DIAGNOSIS — N184 Chronic kidney disease, stage 4 (severe): Secondary | ICD-10-CM | POA: Diagnosis not present

## 2020-03-05 DIAGNOSIS — N184 Chronic kidney disease, stage 4 (severe): Secondary | ICD-10-CM | POA: Diagnosis not present

## 2020-03-06 DIAGNOSIS — N184 Chronic kidney disease, stage 4 (severe): Secondary | ICD-10-CM | POA: Diagnosis not present

## 2020-03-07 DIAGNOSIS — N184 Chronic kidney disease, stage 4 (severe): Secondary | ICD-10-CM | POA: Diagnosis not present

## 2020-03-08 ENCOUNTER — Encounter: Payer: Medicaid Other | Admitting: Internal Medicine

## 2020-03-08 DIAGNOSIS — N184 Chronic kidney disease, stage 4 (severe): Secondary | ICD-10-CM | POA: Diagnosis not present

## 2020-03-08 NOTE — Assessment & Plan Note (Deleted)
Patient was recently seen at [facility] ED for symptomatic hyperglycemia with headache and emesis. He has not been to the clinic since 12/06/2018, during that visit he was restarted on Victoza 0.6 mg with a hope to titrate up the dose to 1.2 mg daily. This was originally limited due to medication side effects. Patient states that he got chest discomfort at this higher dose. His last A1c during that visit was >14. Patient has Lantus 36 units q HS and Glipizide 5 mg qd on his medication list. I do not see Victoza present on his list.   In the ED patient was found to have hyperglycemia of 387, Elevated AG of 15 and HCO3 of 17, and Ua showing significant proteinuria >300. Patient did also have Hgb present in his urine without evidence of blood and a UDS positive for benzodiazepines concerning for possible for withdrawal and possible rhabdomyolysis. PDMP review does not show any recent prescribes of benzos. Will need further screening for substance use and high risk behaviors to avoid a potentially dangerous situation in the future.    Plan: 1. Will get A1c, renal function, microalbumin, and urinalysis 2.

## 2020-03-09 DIAGNOSIS — N184 Chronic kidney disease, stage 4 (severe): Secondary | ICD-10-CM | POA: Diagnosis not present

## 2020-03-10 DIAGNOSIS — N184 Chronic kidney disease, stage 4 (severe): Secondary | ICD-10-CM | POA: Diagnosis not present

## 2020-03-11 DIAGNOSIS — N184 Chronic kidney disease, stage 4 (severe): Secondary | ICD-10-CM | POA: Diagnosis not present

## 2020-03-12 DIAGNOSIS — N184 Chronic kidney disease, stage 4 (severe): Secondary | ICD-10-CM | POA: Diagnosis not present

## 2020-03-13 DIAGNOSIS — N184 Chronic kidney disease, stage 4 (severe): Secondary | ICD-10-CM | POA: Diagnosis not present

## 2020-03-14 ENCOUNTER — Encounter: Payer: Medicaid Other | Admitting: Internal Medicine

## 2020-03-14 DIAGNOSIS — N184 Chronic kidney disease, stage 4 (severe): Secondary | ICD-10-CM | POA: Diagnosis not present

## 2020-03-15 DIAGNOSIS — N184 Chronic kidney disease, stage 4 (severe): Secondary | ICD-10-CM | POA: Diagnosis not present

## 2020-03-15 MED FILL — cloNIDine HCL 0.3 MG TABS: 0.3 | 30 days supply | Qty: 60 | Fill #1

## 2020-03-16 DIAGNOSIS — N184 Chronic kidney disease, stage 4 (severe): Secondary | ICD-10-CM | POA: Diagnosis not present

## 2020-03-17 DIAGNOSIS — N184 Chronic kidney disease, stage 4 (severe): Secondary | ICD-10-CM | POA: Diagnosis not present

## 2020-03-18 DIAGNOSIS — N184 Chronic kidney disease, stage 4 (severe): Secondary | ICD-10-CM | POA: Diagnosis not present

## 2020-03-19 DIAGNOSIS — N184 Chronic kidney disease, stage 4 (severe): Secondary | ICD-10-CM | POA: Diagnosis not present

## 2020-03-20 ENCOUNTER — Ambulatory Visit: Payer: Medicaid Other | Admitting: Diagnostic Neuroimaging

## 2020-03-20 ENCOUNTER — Encounter: Payer: Self-pay | Admitting: Diagnostic Neuroimaging

## 2020-03-20 ENCOUNTER — Other Ambulatory Visit: Payer: Self-pay

## 2020-03-20 VITALS — BP 120/76 | HR 84 | Ht 77.0 in | Wt 293.0 lb

## 2020-03-20 DIAGNOSIS — R569 Unspecified convulsions: Secondary | ICD-10-CM | POA: Diagnosis not present

## 2020-03-20 DIAGNOSIS — N184 Chronic kidney disease, stage 4 (severe): Secondary | ICD-10-CM | POA: Diagnosis not present

## 2020-03-20 MED FILL — CALCIUM ACETATE 667 MG GELC: 667 | 30 days supply | Qty: 90 | Fill #1

## 2020-03-20 NOTE — Progress Notes (Signed)
[facility]  PATIENT: Jeremy [name] DOB: October 09, 1980  REFERRING CLINICIAN: Cato Mulligan, MD HISTORY FROM: patient and sister  REASON FOR VISIT: new consult    HISTORICAL  CHIEF COMPLAINT:  Chief Complaint  Patient presents with  . New onset seizure    rm 6 New Pt sister- [name] "seizure 02/29/20 witnessed by sister, no memory of it; c/o headache x 3 days prior with vomiting"    HISTORY OF PRESENT ILLNESS:   39 year old male with hypertension, diabetes, hyperlipidemia, membranous glomerulonephritis, chronic kidney disease, here for evaluation of seizure.  Patient was at home not feeling well with some fevers, nausea and vomiting.  Sister tried to take him to the ER but then he started acting very confused.  His body stiffened up, eyes rolled back, then had shaking for 1 minute or so.  Afterwards he was very agitated.  EMS was called to scene and brought patient to emergency room.  Patient was confused, hypertensive, hyperglycemic.  He was evaluated and monitored.  Symptoms gradually improved.  CT head was unremarkable.  Patient was discharged for further follow-up in neurology clinic and with PCP and nephrology.  In February 2021 patient had Covid, developed acute kidney injury and was put on hemodialysis.  He has stayed on hemodialysis for several months but apparently was discharged from dialysis 1 month ago due to improving kidney function.   REVIEW OF SYSTEMS: Full 14 system review of systems performed and negative with exception of: As per HPI.  ALLERGIES: Allergies  Allergen Reactions  . Acyclovir And Related Other (See Comments)    Unknown reaction    HOME MEDICATIONS: Outpatient Medications Prior to Visit  Medication Sig Dispense Refill  . Accu-Chek FastClix Lancets MISC Check your blood sugars 4 times a day. 402 each 1  . ACCU-CHEK GUIDE test strip USE TO CHECK BLOOD SUGAR 4 TIMES DAILY 375 strip 1  . acetaminophen (TYLENOL) 500 MG tablet  Take 1,000 mg by mouth every 6 (six) hours as needed for fever or headache (pain).    [name] atorvastatin (LIPITOR) 80 MG tablet TAKE 1 TABLET BY MOUTH DAILY. 90 tablet 1  . blood glucose meter kit and supplies KIT Dispense based on patient and insurance preference. Use up to four times daily as directed. (FOR ICD-9 250.00, 250.01). 1 each 0  . carvedilol (COREG) 25 MG tablet Take 25 mg by mouth 2 (two) times daily with a meal.    . cloNIDine (CATAPRES) 0.3 MG tablet Take 1 tablet (0.3 mg total) by mouth 2 (two) times daily. 60 tablet 6  . famotidine (PEPCID) 10 MG tablet Take 1 tablet (10 mg total) by mouth daily as needed for heartburn or indigestion. (Patient taking differently: Take 10 mg by mouth daily. ) 90 tablet 3  . glipiZIDE (GLUCOTROL) 5 MG tablet Take 1 tablet (5 mg total) by mouth daily. 30 tablet 0  . hydrALAZINE (APRESOLINE) 100 MG tablet TAKE 1 TABLET BY MOUTH 3 TIMES DAILY (Patient taking differently: Take 100 mg by mouth 3 (three) times daily. ) 90 tablet 6  . Insulin Syringe-Needle U-100 (ULTICARE INSULIN SYRINGE) 30G X 1/2" 0.5 ML MISC USE 4 TIMES DAILY WITH INSULIN INJECTIONS. 100 each 1  . LANTUS 100 UNIT/ML injection INJECT 36 UNITS UNDER THE SKIN AT BEDTIME 60 mL 0  . NIFEdipine (PROCARDIA XL/NIFEDICAL-XL) 90 MG 24 hr tablet Take 1 tablet (90 mg total) by mouth daily. 30 tablet 6  . nitroGLYCERIN (NITROSTAT) 0.4 MG SL tablet PLACE 1 TABLET UNDER  THE TONGUE EVERY 5 MINUTES AS NEEDED FOR CHEST PAIN. (Patient taking differently: Place 0.4 mg under the tongue every 5 (five) minutes as needed for chest pain. ) 25 tablet 3  . NOVOLOG 100 UNIT/ML injection INJECT 15 UNITS 3 TIMES A DAY WITH MEALS INTO THE SKIN. (Patient taking differently: Inject 20 Units into the skin 3 (three) times daily with meals. ) 40 mL 3  . UNIFINE PENTIPS 32G X 4 MM MISC USE ONCE DAILY WITH VICTOZA 100 each 1  . Vitamin D, Ergocalciferol, (DRISDOL) 1.25 MG (50000 UNIT) CAPS capsule Take 50,000 Units by mouth See  admin instructions. Take one capsule (50000 units) by mouth twice weekly - on Wednesday and Sunday     No facility-administered medications prior to visit.    PAST MEDICAL HISTORY: Past Medical History:  Diagnosis Date  . Abscess of left groin   . Acute blood loss anemia 11/11/2013  . Anemia in chronic kidney disease (CKD)   . Asthma   . Boil of scrotum 11/21/2015  . Chest pain    a. 01/2015 Lexiscan MV: EF 28%, inferior, inferolateral, apical ischemia;  b. 01/2015 Cath: nl cors, PCWP 18 mmHg, CO 9.38 L/min, CI 3.53 L/min/m^2.  . Essential hypertension   . Hyperlipidemia   . Membranous glomerulonephritis   . Morbid obesity ([place])   . Nonischemic cardiomyopathy ([place])    a. 01/2015 Echo: EF 20-25%, diff HK, Gr 2 DD, Triv AI, mildly dil LA and Ao root.  . Seizures ([place]) 02/29/2020  . Type II diabetes mellitus ([place])    a. 01/2015 HbA1c = 8.9.    PAST SURGICAL HISTORY: Past Surgical History:  Procedure Laterality Date  . CARDIAC CATHETERIZATION N/A 02/01/2015   Procedure: Right/Left Heart Cath and Coronary Angiography;  Surgeon: Josue Hector, MD;  Location: [facility];  Service: Cardiovascular;  Laterality: N/A;  . IR FLUORO GUIDE CV LINE RIGHT  10/11/2019  . IR US GUIDE VASC ACCESS RIGHT  10/11/2019  . THORACOTOMY Left 11/08/2013   Procedure: LEFT THORACOTOMY;  Surgeon: Ivin Poot, MD;  Location: [place];  Service: Thoracic;  Laterality: Left;    FAMILY HISTORY: Family History  Problem Relation Age of Onset  . Diabetes Mellitus II Mother        died @ 82.  . Gastric cancer Mother   . CAD Father        died @ 31.  [name] Heart attack Father   . Congestive Heart Failure Father   . Diabetes Mellitus II Sister   . CAD Sister        s/p PCI - age 68.    SOCIAL HISTORY: Social History   Socioeconomic History  . Marital status: Single    Spouse name: Not on file  . Number of children: Not on file  . Years of education: Not on file  . Highest education level: Some college, no  degree  Occupational History  . Not on file  Tobacco Use  . Smoking status: Never Smoker  . Smokeless tobacco: Never Used  Substance and Sexual Activity  . Alcohol use: No    Alcohol/week: 0.0 standard drinks  . Drug use: Yes    Types: Marijuana    Comment: 03/20/20 smoked marijuana 2-3 x/wk.  [name] Sexual activity: Yes    Partners: Female  Other Topics Concern  . Not on file  Social History Narrative   Lives in [place] by himself.  Currently in [facility].   Caffeine- none  Social Determinants of Health   Financial Resource Strain:   . Difficulty of Paying Living Expenses:   Food Insecurity:   . Worried About Charity fundraiser in the Last Year:   . Arboriculturist in the Last Year:   Transportation Needs:   . Film/video editor (Medical):   [name] Lack of Transportation (Non-Medical):   Physical Activity:   . Days of Exercise per Week:   . Minutes of Exercise per Session:   Stress:   . Feeling of Stress :   Social Connections:   . Frequency of Communication with Friends and Family:   . Frequency of Social Gatherings with Friends and Family:   . Attends Religious Services:   . Active Member of Clubs or Organizations:   . Attends Archivist Meetings:   [name] Marital Status:   Intimate Partner Violence:   . Fear of Current or Ex-Partner:   . Emotionally Abused:   [name] Physically Abused:   . Sexually Abused:      PHYSICAL EXAM  GENERAL EXAM/CONSTITUTIONAL: Vitals:  Vitals:   03/20/20 1335  BP: 120/76  Pulse: 84  Weight: 293 lb (132.9 kg)  Height: _0  (1.956 m)     Body mass index is 34.74 kg/m. Wt Readings from Last 3 Encounters:  03/20/20 293 lb (132.9 kg)  11/15/19 288 lb 6.4 oz (130.8 kg)  10/17/19 285 lb 7.9 oz (129.5 kg)     Patient is in no distress; well developed, nourished and groomed; neck is supple  CARDIOVASCULAR:  Examination of carotid arteries is normal; no carotid bruits  Regular rate and rhythm, no  murmurs  Examination of peripheral vascular system by observation and palpation is normal  EYES:  Ophthalmoscopic exam of optic discs and posterior segments is normal; no papilledema or hemorrhages  No exam data present  MUSCULOSKELETAL:  Gait, strength, tone, movements noted in Neurologic exam below  NEUROLOGIC: MENTAL STATUS:  No flowsheet data found.  awake, alert, oriented to person, place and time  recent and remote memory intact  normal attention and concentration  language fluent, comprehension intact, naming intact  fund of knowledge appropriate  CRANIAL NERVE:   2nd - no papilledema on fundoscopic exam  2nd, 3rd, 4th, 6th - pupils equal and reactive to light, visual fields full to confrontation, extraocular muscles intact, no nystagmus  5th - facial sensation symmetric  7th - facial strength symmetric  8th - hearing intact  9th - palate elevates symmetrically, uvula midline  11th - shoulder shrug symmetric  12th - tongue protrusion midline  MOTOR:   normal bulk and tone, full strength in the BUE, BLE  SENSORY:   normal and symmetric to light touch, temperature, vibration  COORDINATION:   finger-nose-finger, fine finger movements normal  REFLEXES:   deep tendon reflexes TRACE and symmetric  GAIT/STATION:   narrow based gait     DIAGNOSTIC DATA (LABS, IMAGING, TESTING) - I reviewed patient records, labs, notes, testing and imaging myself where available.  Lab Results  Component Value Date   WBC 13.0 (H) 02/29/2020   HGB 13.4 02/29/2020   HCT 42.0 02/29/2020   MCV 81.2 02/29/2020   PLT 296 02/29/2020      Component Value Date/Time   NA 141 02/29/2020 2314   NA 137 04/30/2017 1606   K 3.5 02/29/2020 2314   CL 109 02/29/2020 2314   CO2 17 (L) 02/29/2020 2314   GLUCOSE 456 (H) 02/29/2020 2314   BUN 55 (H)  02/29/2020 2314   BUN 42 (H) 04/30/2017 1606   CREATININE 7.08 (H) 02/29/2020 2314   CALCIUM 8.7 (L) 02/29/2020 2314    PROT 7.2 02/29/2020 2314   ALBUMIN 2.8 (L) 02/29/2020 2314   AST 20 02/29/2020 2314   ALT 17 02/29/2020 2314   ALKPHOS 68 02/29/2020 2314   BILITOT 0.7 02/29/2020 2314   GFRNONAA 9 (L) 02/29/2020 2314   GFRAA 10 (L) 02/29/2020 2314   Lab Results  Component Value Date   CHOL 135 06/28/2019   HDL 31 (L) 06/28/2019   LDLCALC 64 06/28/2019   TRIG 202 (H) 06/28/2019   CHOLHDL 4.4 06/28/2019   Lab Results  Component Value Date   HGBA1C >14.0 12/06/2018   Lab Results  Component Value Date   VITAMINB12 331 05/29/2016   Lab Results  Component Value Date   TSH 2.556 01/09/2015    03/01/20 CT head [I reviewed images myself and agree with interpretation. -VRP]  - No acute intracranial abnormality.    ASSESSMENT AND PLAN  39 y.o. year old male here with:  Dx:  1. New onset seizure ([place])      PLAN:  NEW ONSET SEIZURE (in setting of hypertensive crisis, hyperglycemia, fever, CKD)  - check MRI brain, EEG  - follow up with PCP and nephrology (kidney)  - According to [place] law, you can not drive unless you are seizure / syncope free for at least 6 months and under physician's care.   - Please maintain precautions. Do not participate in activities where a loss of awareness could harm you or someone else. No swimming alone, no tub bathing, no hot tubs, no driving, no operating motorized vehicles (cars, ATVs, motocycles, etc), lawnmowers, power tools or firearms. No standing at heights, such as rooftops, ladders or stairs. Avoid hot objects such as stoves, heaters, open fires. Wear a helmet when riding a bicycle, scooter, skateboard, etc. and avoid areas of traffic. Set your water heater to 120 degrees or less.   Orders Placed This Encounter  Procedures  . MR BRAIN WO CONTRAST  . EEG   Return pending testing, for pending if symptoms worsen or fail to improve.    Penni Bombard, MD 1/61/0960, 4:54 PM Certified in Neurology, Neurophysiology and Neuroimaging  [facility] [address] [place], [place] [address] [phone]

## 2020-03-20 NOTE — Patient Instructions (Signed)
NEW ONSET SEIZURE (in setting of hypertensive crisis, hyperglycemia, fever, CKD)  - check MRI brain, EEG  - follow up with PCP and nephrology (kidney)  - According to [place] law, you can not drive unless you are seizure / syncope free for at least 6 months and under physician's care.   - Please maintain precautions. Do not participate in activities where a loss of awareness could harm you or someone else. No swimming alone, no tub bathing, no hot tubs, no driving, no operating motorized vehicles (cars, ATVs, motocycles, etc), lawnmowers, power tools or firearms. No standing at heights, such as rooftops, ladders or stairs. Avoid hot objects such as stoves, heaters, open fires. Wear a helmet when riding a bicycle, scooter, skateboard, etc. and avoid areas of traffic. Set your water heater to 120 degrees or less.

## 2020-03-21 ENCOUNTER — Telehealth: Payer: Self-pay | Admitting: Diagnostic Neuroimaging

## 2020-03-21 DIAGNOSIS — N184 Chronic kidney disease, stage 4 (severe): Secondary | ICD-10-CM | POA: Diagnosis not present

## 2020-03-21 MED FILL — VIT D2 1.25 MG (50,000 UNIT: 1.25 MG | 28 days supply | Qty: 8 | Fill #6

## 2020-03-21 NOTE — Telephone Encounter (Signed)
[employer] medicaid pending

## 2020-03-22 DIAGNOSIS — N184 Chronic kidney disease, stage 4 (severe): Secondary | ICD-10-CM | POA: Diagnosis not present

## 2020-03-23 ENCOUNTER — Other Ambulatory Visit (HOSPITAL_COMMUNITY): Payer: Self-pay | Admitting: Cardiology

## 2020-03-23 DIAGNOSIS — N184 Chronic kidney disease, stage 4 (severe): Secondary | ICD-10-CM | POA: Diagnosis not present

## 2020-03-23 MED FILL — CARVEDILOL 25 MG TABLET: 25 | 30 days supply | Qty: 60 | Fill #0

## 2020-03-23 MED FILL — NovoLOG 100 UNIT/ML SOLN: 100 | 22 days supply | Qty: 10 | Fill #7

## 2020-03-24 DIAGNOSIS — N184 Chronic kidney disease, stage 4 (severe): Secondary | ICD-10-CM | POA: Diagnosis not present

## 2020-03-25 DIAGNOSIS — N184 Chronic kidney disease, stage 4 (severe): Secondary | ICD-10-CM | POA: Diagnosis not present

## 2020-03-26 DIAGNOSIS — I129 Hypertensive chronic kidney disease with stage 1 through stage 4 chronic kidney disease, or unspecified chronic kidney disease: Secondary | ICD-10-CM | POA: Diagnosis not present

## 2020-03-26 DIAGNOSIS — N2581 Secondary hyperparathyroidism of renal origin: Secondary | ICD-10-CM | POA: Diagnosis not present

## 2020-03-26 DIAGNOSIS — E785 Hyperlipidemia, unspecified: Secondary | ICD-10-CM | POA: Diagnosis not present

## 2020-03-26 DIAGNOSIS — N184 Chronic kidney disease, stage 4 (severe): Secondary | ICD-10-CM | POA: Diagnosis not present

## 2020-03-26 DIAGNOSIS — I5022 Chronic systolic (congestive) heart failure: Secondary | ICD-10-CM | POA: Diagnosis not present

## 2020-03-26 DIAGNOSIS — N179 Acute kidney failure, unspecified: Secondary | ICD-10-CM | POA: Diagnosis not present

## 2020-03-26 DIAGNOSIS — N052 Unspecified nephritic syndrome with diffuse membranous glomerulonephritis: Secondary | ICD-10-CM | POA: Diagnosis not present

## 2020-03-26 DIAGNOSIS — E1129 Type 2 diabetes mellitus with other diabetic kidney complication: Secondary | ICD-10-CM | POA: Diagnosis not present

## 2020-03-26 MED FILL — FUROSEMIDE 80 MG TAB: 80 | 90 days supply | Qty: 90 | Fill #0

## 2020-03-27 DIAGNOSIS — N184 Chronic kidney disease, stage 4 (severe): Secondary | ICD-10-CM | POA: Diagnosis not present

## 2020-03-28 ENCOUNTER — Telehealth: Payer: Self-pay | Admitting: Diagnostic Neuroimaging

## 2020-03-28 ENCOUNTER — Other Ambulatory Visit: Payer: Self-pay

## 2020-03-28 DIAGNOSIS — N184 Chronic kidney disease, stage 4 (severe): Secondary | ICD-10-CM | POA: Diagnosis not present

## 2020-03-28 NOTE — Telephone Encounter (Signed)
--   03/28/2020 This will wait for [name] return because of new contract. I will call and make patient aware. Jeremy [name]

## 2020-03-29 DIAGNOSIS — N184 Chronic kidney disease, stage 4 (severe): Secondary | ICD-10-CM | POA: Diagnosis not present

## 2020-03-30 DIAGNOSIS — N184 Chronic kidney disease, stage 4 (severe): Secondary | ICD-10-CM | POA: Diagnosis not present

## 2020-03-31 DIAGNOSIS — N184 Chronic kidney disease, stage 4 (severe): Secondary | ICD-10-CM | POA: Diagnosis not present

## 2020-04-01 DIAGNOSIS — N184 Chronic kidney disease, stage 4 (severe): Secondary | ICD-10-CM | POA: Diagnosis not present

## 2020-04-02 ENCOUNTER — Other Ambulatory Visit: Payer: Self-pay | Admitting: Internal Medicine

## 2020-04-02 DIAGNOSIS — N184 Chronic kidney disease, stage 4 (severe): Secondary | ICD-10-CM | POA: Diagnosis not present

## 2020-04-02 NOTE — Telephone Encounter (Signed)
Spoke with patient about his diabetic regimen and he states he is currently on the glipizide in addition to his insulin. He has been out of his glipizide since Friday so he has had elevated BS in the 160s the last few days. I approved the new request for glipizide and asked him to schedule a clinic appointment for diabetes follow up.

## 2020-04-02 NOTE — Telephone Encounter (Signed)
[facility] [place]: [id number] (exp. 03/21/20 to 05/19/20) order sent to GI. They will reach out to the patient to schedule.

## 2020-04-03 DIAGNOSIS — N184 Chronic kidney disease, stage 4 (severe): Secondary | ICD-10-CM | POA: Diagnosis not present

## 2020-04-04 ENCOUNTER — Other Ambulatory Visit: Payer: Self-pay | Admitting: Internal Medicine

## 2020-04-04 DIAGNOSIS — N184 Chronic kidney disease, stage 4 (severe): Secondary | ICD-10-CM | POA: Diagnosis not present

## 2020-04-04 MED ORDER — GLIPIZIDE 5 MG PO TABS: 5.0000 mg | ORAL_TABLET | Freq: Every day | ORAL | 0 refills | Status: DC

## 2020-04-04 MED FILL — glipiZIDE 5 MG TABS: 5 | 90 days supply | Qty: 90 | Fill #0

## 2020-04-05 DIAGNOSIS — N184 Chronic kidney disease, stage 4 (severe): Secondary | ICD-10-CM | POA: Diagnosis not present

## 2020-04-06 ENCOUNTER — Other Ambulatory Visit: Payer: Self-pay

## 2020-04-06 ENCOUNTER — Encounter: Payer: Self-pay | Admitting: Vascular Surgery

## 2020-04-06 ENCOUNTER — Ambulatory Visit (HOSPITAL_COMMUNITY)
Admission: RE | Admit: 2020-04-06 | Discharge: 2020-04-06 | Disposition: A | Discharge status: Home / Self Care | Payer: Medicaid Other | Source: Ambulatory Visit | Attending: Vascular Surgery | Admitting: Vascular Surgery

## 2020-04-06 ENCOUNTER — Ambulatory Visit (INDEPENDENT_AMBULATORY_CARE_PROVIDER_SITE_OTHER): Payer: Medicaid Other | Admitting: Vascular Surgery

## 2020-04-06 ENCOUNTER — Ambulatory Visit (INDEPENDENT_AMBULATORY_CARE_PROVIDER_SITE_OTHER)
Admission: RE | Admit: 2020-04-06 | Discharge: 2020-04-06 | Disposition: A | Discharge status: Home / Self Care | Payer: Medicaid Other | Source: Ambulatory Visit | Attending: Vascular Surgery | Admitting: Vascular Surgery

## 2020-04-06 VITALS — BP 112/76 | HR 61 | Temp 97.5°F | Resp 16 | Ht 77.0 in | Wt 295.0 lb

## 2020-04-06 DIAGNOSIS — N184 Chronic kidney disease, stage 4 (severe): Secondary | ICD-10-CM

## 2020-04-06 NOTE — Progress Notes (Signed)
Patient ID: Jeremy [name], male   DOB: September 24, 1980, 39 y.o.   MRN: [id number]  Reason for Consult: New Patient (Initial Visit) (eval for access)   Referred by Edrick Oh, MD  Subjective:     HPI:  Jeremy [name] is a 39 y.o. male on dialysis previously via right chest catheter.  States that now he has not needed dialysis he is followed by Dr. Justin Mend.  He is left-hand dominant.  He has never had upper extremity chest or breast surgery other than his catheter placement.  Currently feeling good not having any fluid overload symptoms denies any swelling or shortness of breath at this time.  Past Medical History:  Diagnosis Date  . Abscess of left groin   . Acute blood loss anemia 11/11/2013  . Anemia in chronic kidney disease (CKD)   . Asthma   . Boil of scrotum 11/21/2015  . Chest pain    a. 01/2015 Lexiscan MV: EF 28%, inferior, inferolateral, apical ischemia;  b. 01/2015 Cath: nl cors, PCWP 18 mmHg, CO 9.38 L/min, CI 3.53 L/min/m^2.  . Essential hypertension   . Hyperlipidemia   . Membranous glomerulonephritis   . Morbid obesity ([place])   . Nonischemic cardiomyopathy ([place])    a. 01/2015 Echo: EF 20-25%, diff HK, Gr 2 DD, Triv AI, mildly dil LA and Ao root.  . Seizures ([place]) 02/29/2020  . Type II diabetes mellitus ([place])    a. 01/2015 HbA1c = 8.9.   Family History  Problem Relation Age of Onset  . Diabetes Mellitus II Mother        died @ 37.  . Gastric cancer Mother   . CAD Father        died @ 18.  [name] Heart attack Father   . Congestive Heart Failure Father   . Diabetes Mellitus II Sister   . CAD Sister        s/p PCI - age 71.   Past Surgical History:  Procedure Laterality Date  . CARDIAC CATHETERIZATION N/A 02/01/2015   Procedure: Right/Left Heart Cath and Coronary Angiography;  Surgeon: Josue Hector, MD;  Location: [place];  Service: Cardiovascular;  Laterality: N/A;  . IR FLUORO GUIDE CV LINE RIGHT  10/11/2019  . IR US GUIDE VASC ACCESS RIGHT  10/11/2019  .  THORACOTOMY Left 11/08/2013   Procedure: LEFT THORACOTOMY;  Surgeon: Ivin Poot, MD;  Location: [facility];  Service: Thoracic;  Laterality: Left;    Short Social History:  Social History   Tobacco Use  . Smoking status: Never Smoker  . Smokeless tobacco: Never Used  Substance Use Topics  . Alcohol use: No    Alcohol/week: 0.0 standard drinks    Allergies  Allergen Reactions  . Acyclovir And Related Other (See Comments)    Unknown reaction    Current Outpatient Medications  Medication Sig Dispense Refill  . Accu-Chek FastClix Lancets MISC Check your blood sugars 4 times a day. 402 each 1  . ACCU-CHEK GUIDE test strip USE TO CHECK BLOOD SUGAR 4 TIMES DAILY 375 strip 1  . acetaminophen (TYLENOL) 500 MG tablet Take 1,000 mg by mouth every 6 (six) hours as needed for fever or headache (pain).    [name] atorvastatin (LIPITOR) 80 MG tablet TAKE 1 TABLET BY MOUTH DAILY. 90 tablet 1  . blood glucose meter kit and supplies KIT Dispense based on patient and insurance preference. Use up to four times daily as directed. (FOR ICD-9 250.00, 250.01). 1 each 0  .  carvedilol (COREG) 25 MG tablet TAKE 1 TABLET BY MOUTH TWICE DAILY WITH MEALS. 60 tablet 4  . cloNIDine (CATAPRES) 0.3 MG tablet Take 1 tablet (0.3 mg total) by mouth 2 (two) times daily. 60 tablet 6  . famotidine (PEPCID) 10 MG tablet Take 1 tablet (10 mg total) by mouth daily as needed for heartburn or indigestion. (Patient taking differently: Take 10 mg by mouth daily. ) 90 tablet 3  . glipiZIDE (GLUCOTROL) 5 MG tablet Take 1 tablet (5 mg total) by mouth daily. 90 tablet 0  . hydrALAZINE (APRESOLINE) 100 MG tablet TAKE 1 TABLET BY MOUTH 3 TIMES DAILY (Patient taking differently: Take 100 mg by mouth 3 (three) times daily. ) 90 tablet 6  . Insulin Syringe-Needle U-100 (ULTICARE INSULIN SYRINGE) 30G X 1/2" 0.5 ML MISC USE 4 TIMES DAILY WITH INSULIN INJECTIONS. 100 each 1  . LANTUS 100 UNIT/ML injection INJECT 36 UNITS UNDER THE SKIN AT BEDTIME  60 mL 0  . NIFEdipine (PROCARDIA XL/NIFEDICAL-XL) 90 MG 24 hr tablet Take 1 tablet (90 mg total) by mouth daily. 30 tablet 6  . nitroGLYCERIN (NITROSTAT) 0.4 MG SL tablet PLACE 1 TABLET UNDER THE TONGUE EVERY 5 MINUTES AS NEEDED FOR CHEST PAIN. (Patient taking differently: Place 0.4 mg under the tongue every 5 (five) minutes as needed for chest pain. ) 25 tablet 3  . NOVOLOG 100 UNIT/ML injection INJECT 15 UNITS 3 TIMES A DAY WITH MEALS INTO THE SKIN. (Patient taking differently: Inject 20 Units into the skin 3 (three) times daily with meals. ) 40 mL 3  . UNIFINE PENTIPS 32G X 4 MM MISC USE ONCE DAILY WITH VICTOZA 100 each 1  . Vitamin D, Ergocalciferol, (DRISDOL) 1.25 MG (50000 UNIT) CAPS capsule Take 50,000 Units by mouth See admin instructions. Take one capsule (50000 units) by mouth twice weekly - on Wednesday and Sunday     No current facility-administered medications for this visit.    Review of Systems  Constitutional:  Constitutional negative. HENT: HENT negative.  Eyes: Eyes negative.  Respiratory: Respiratory negative.  GI: Gastrointestinal negative.  Musculoskeletal: Musculoskeletal negative.  Skin: Skin negative.  Neurological: Neurological negative. Hematologic: Hematologic/lymphatic negative.  Psychiatric: Psychiatric negative.        Objective:  Objective   Vitals:   04/06/20 1536  BP: 112/76  Pulse: 61  Resp: 16  Temp: (!) 97.5 F (36.4 C)  TempSrc: Temporal  SpO2: 98%  Weight: (!) 295 lb (133.8 kg)  Height: '6\' 5"'$  (1.956 m)   Body mass index is 34.98 kg/m.  Physical Exam HENT:     Head: Normocephalic.     Nose:     Comments: Wearing a mask Pulmonary:     Effort: Pulmonary effort is normal.  Abdominal:     General: Abdomen is flat.  Musculoskeletal:        General: Normal range of motion.  Skin:    General: Skin is warm.     Capillary Refill: Capillary refill takes less than 2 seconds.  Neurological:     General: No focal deficit present.      Mental Status: He is alert.  Psychiatric:        Mood and Affect: Mood normal.        Behavior: Behavior normal.        Thought Content: Thought content normal.        Judgment: Judgment normal.     Data: I have independently interpreted his bilateral upper extremity vein mapping there appears to  be suitable cephalic vein on the right as well as possible basilic vein on the right.  On the left side he also has suitable cephalic vein possible basilic vein for fistula creation.  I have independently interpreted his bilateral upper extremity arterial duplex.  All waveforms are triphasic.  Left brachial artery 0.51 cm and right 0.55 cm.     Assessment/Plan:     39 year old left-handed dominant man previously on dialysis via catheter not currently dialyzing but has a catheter in place.  We discussed the options for dialysis including his current catheter, fistula, graft.  He does appear to have suitable vein for fistula creation in his nondominant right upper extremity.  He wants to discuss his options further with Dr. Justin Mend and he can call to schedule right arm AV fistula creation without needing further office visit.     Waynetta Sandy MD [facility]

## 2020-04-07 DIAGNOSIS — N184 Chronic kidney disease, stage 4 (severe): Secondary | ICD-10-CM | POA: Diagnosis not present

## 2020-04-08 DIAGNOSIS — N184 Chronic kidney disease, stage 4 (severe): Secondary | ICD-10-CM | POA: Diagnosis not present

## 2020-04-09 DIAGNOSIS — N184 Chronic kidney disease, stage 4 (severe): Secondary | ICD-10-CM | POA: Diagnosis not present

## 2020-04-10 ENCOUNTER — Ambulatory Visit
Admission: RE | Admit: 2020-04-10 | Discharge: 2020-04-10 | Disposition: A | Discharge status: Home / Self Care | Payer: Medicaid Other | Source: Ambulatory Visit | Attending: Diagnostic Neuroimaging | Admitting: Diagnostic Neuroimaging

## 2020-04-10 ENCOUNTER — Other Ambulatory Visit: Payer: Self-pay

## 2020-04-10 DIAGNOSIS — R569 Unspecified convulsions: Secondary | ICD-10-CM | POA: Diagnosis not present

## 2020-04-10 DIAGNOSIS — N184 Chronic kidney disease, stage 4 (severe): Secondary | ICD-10-CM | POA: Diagnosis not present

## 2020-04-11 DIAGNOSIS — N184 Chronic kidney disease, stage 4 (severe): Secondary | ICD-10-CM | POA: Diagnosis not present

## 2020-04-12 ENCOUNTER — Encounter: Payer: Self-pay | Admitting: *Deleted

## 2020-04-12 DIAGNOSIS — N184 Chronic kidney disease, stage 4 (severe): Secondary | ICD-10-CM | POA: Diagnosis not present

## 2020-04-12 MED FILL — hydrALAZINE HCL 100 MG TABS: 100 | 30 days supply | Qty: 90 | Fill #6

## 2020-04-13 DIAGNOSIS — N184 Chronic kidney disease, stage 4 (severe): Secondary | ICD-10-CM | POA: Diagnosis not present

## 2020-04-14 DIAGNOSIS — N184 Chronic kidney disease, stage 4 (severe): Secondary | ICD-10-CM | POA: Diagnosis not present

## 2020-04-15 DIAGNOSIS — N184 Chronic kidney disease, stage 4 (severe): Secondary | ICD-10-CM | POA: Diagnosis not present

## 2020-04-16 ENCOUNTER — Other Ambulatory Visit: Payer: Medicaid Other

## 2020-04-16 DIAGNOSIS — N184 Chronic kidney disease, stage 4 (severe): Secondary | ICD-10-CM | POA: Diagnosis not present

## 2020-04-17 DIAGNOSIS — Z20822 Contact with and (suspected) exposure to covid-19: Secondary | ICD-10-CM | POA: Diagnosis not present

## 2020-04-17 DIAGNOSIS — N184 Chronic kidney disease, stage 4 (severe): Secondary | ICD-10-CM | POA: Diagnosis not present

## 2020-04-18 ENCOUNTER — Ambulatory Visit: Payer: Medicaid Other

## 2020-04-18 DIAGNOSIS — R569 Unspecified convulsions: Secondary | ICD-10-CM | POA: Diagnosis not present

## 2020-04-18 DIAGNOSIS — N184 Chronic kidney disease, stage 4 (severe): Secondary | ICD-10-CM | POA: Diagnosis not present

## 2020-04-18 MED FILL — cloNIDine HCL 0.3 MG TABS: 0.3 | 30 days supply | Qty: 60 | Fill #4

## 2020-04-19 DIAGNOSIS — N184 Chronic kidney disease, stage 4 (severe): Secondary | ICD-10-CM | POA: Diagnosis not present

## 2020-04-20 DIAGNOSIS — N184 Chronic kidney disease, stage 4 (severe): Secondary | ICD-10-CM | POA: Diagnosis not present

## 2020-04-21 DIAGNOSIS — N184 Chronic kidney disease, stage 4 (severe): Secondary | ICD-10-CM | POA: Diagnosis not present

## 2020-04-22 DIAGNOSIS — N184 Chronic kidney disease, stage 4 (severe): Secondary | ICD-10-CM | POA: Diagnosis not present

## 2020-04-23 DIAGNOSIS — N184 Chronic kidney disease, stage 4 (severe): Secondary | ICD-10-CM | POA: Diagnosis not present

## 2020-04-24 DIAGNOSIS — N184 Chronic kidney disease, stage 4 (severe): Secondary | ICD-10-CM | POA: Diagnosis not present

## 2020-04-25 DIAGNOSIS — N184 Chronic kidney disease, stage 4 (severe): Secondary | ICD-10-CM | POA: Diagnosis not present

## 2020-04-26 DIAGNOSIS — N184 Chronic kidney disease, stage 4 (severe): Secondary | ICD-10-CM | POA: Diagnosis not present

## 2020-04-27 DIAGNOSIS — N184 Chronic kidney disease, stage 4 (severe): Secondary | ICD-10-CM | POA: Diagnosis not present

## 2020-04-28 DIAGNOSIS — N184 Chronic kidney disease, stage 4 (severe): Secondary | ICD-10-CM | POA: Diagnosis not present

## 2020-04-29 DIAGNOSIS — N184 Chronic kidney disease, stage 4 (severe): Secondary | ICD-10-CM | POA: Diagnosis not present

## 2020-04-30 DIAGNOSIS — N184 Chronic kidney disease, stage 4 (severe): Secondary | ICD-10-CM | POA: Diagnosis not present

## 2020-04-30 MED FILL — CARVEDILOL 25 MG TABLET: 25 | 30 days supply | Qty: 60 | Fill #1

## 2020-05-01 DIAGNOSIS — N184 Chronic kidney disease, stage 4 (severe): Secondary | ICD-10-CM | POA: Diagnosis not present

## 2020-05-02 DIAGNOSIS — N184 Chronic kidney disease, stage 4 (severe): Secondary | ICD-10-CM | POA: Diagnosis not present

## 2020-05-03 DIAGNOSIS — N184 Chronic kidney disease, stage 4 (severe): Secondary | ICD-10-CM | POA: Diagnosis not present

## 2020-05-04 DIAGNOSIS — N184 Chronic kidney disease, stage 4 (severe): Secondary | ICD-10-CM | POA: Diagnosis not present

## 2020-05-05 DIAGNOSIS — N184 Chronic kidney disease, stage 4 (severe): Secondary | ICD-10-CM | POA: Diagnosis not present

## 2020-05-06 DIAGNOSIS — N184 Chronic kidney disease, stage 4 (severe): Secondary | ICD-10-CM | POA: Diagnosis not present

## 2020-05-07 DIAGNOSIS — N184 Chronic kidney disease, stage 4 (severe): Secondary | ICD-10-CM | POA: Diagnosis not present

## 2020-05-08 DIAGNOSIS — N184 Chronic kidney disease, stage 4 (severe): Secondary | ICD-10-CM | POA: Diagnosis not present

## 2020-05-09 DIAGNOSIS — N184 Chronic kidney disease, stage 4 (severe): Secondary | ICD-10-CM | POA: Diagnosis not present

## 2020-05-10 DIAGNOSIS — N184 Chronic kidney disease, stage 4 (severe): Secondary | ICD-10-CM | POA: Diagnosis not present

## 2020-05-11 DIAGNOSIS — N184 Chronic kidney disease, stage 4 (severe): Secondary | ICD-10-CM | POA: Diagnosis not present

## 2020-05-12 DIAGNOSIS — N184 Chronic kidney disease, stage 4 (severe): Secondary | ICD-10-CM | POA: Diagnosis not present

## 2020-05-13 DIAGNOSIS — N184 Chronic kidney disease, stage 4 (severe): Secondary | ICD-10-CM | POA: Diagnosis not present

## 2020-05-14 DIAGNOSIS — N184 Chronic kidney disease, stage 4 (severe): Secondary | ICD-10-CM | POA: Diagnosis not present

## 2020-05-15 ENCOUNTER — Encounter: Payer: Medicaid Other | Admitting: Student

## 2020-05-15 DIAGNOSIS — N184 Chronic kidney disease, stage 4 (severe): Secondary | ICD-10-CM | POA: Diagnosis not present

## 2020-05-16 DIAGNOSIS — N184 Chronic kidney disease, stage 4 (severe): Secondary | ICD-10-CM | POA: Diagnosis not present

## 2020-05-17 DIAGNOSIS — N184 Chronic kidney disease, stage 4 (severe): Secondary | ICD-10-CM | POA: Diagnosis not present

## 2020-05-17 NOTE — Procedures (Signed)
   [facility]  EEG (ELECTROENCEPHALOGRAM) REPORT   STUDY DATE: 04/18/20 PATIENT NAME: Jeremy [name] DOB: [birth year] MRN: [id number]  ORDERING CLINICIAN: Andrey Spearman, MD   TECHNOLOGIST: Babs Bertin TECHNIQUE: Electroencephalogram was recorded utilizing standard 10-20 system of lead placement and reformatted into average and bipolar montages.  RECORDING TIME: 25 minutes  ACTIVATION: hyperventilation and photic stimulation  CLINICAL INFORMATION: 39 year old male with seizure.  FINDINGS: Posterior dominant background rhythms, which attenuate with eye opening, ranging 8-9 hertz and 15-20 microvolts. No focal, lateralizing, epileptiform activity or seizures are seen. Patient recorded in the awake and drowsy state. EKG channel shows regular rhythm of 60-70 beats per minute.   IMPRESSION:   Normal EEG in the awake and drowsy states.    INTERPRETING PHYSICIAN:  Penni Bombard, MD Certified in Neurology, Neurophysiology and Neuroimaging  [facility] [address], [place], [place] [address] [phone]

## 2020-05-18 DIAGNOSIS — N184 Chronic kidney disease, stage 4 (severe): Secondary | ICD-10-CM | POA: Diagnosis not present

## 2020-05-19 DIAGNOSIS — N184 Chronic kidney disease, stage 4 (severe): Secondary | ICD-10-CM | POA: Diagnosis not present

## 2020-05-20 DIAGNOSIS — N184 Chronic kidney disease, stage 4 (severe): Secondary | ICD-10-CM | POA: Diagnosis not present

## 2020-05-21 DIAGNOSIS — N184 Chronic kidney disease, stage 4 (severe): Secondary | ICD-10-CM | POA: Diagnosis not present

## 2020-05-22 ENCOUNTER — Encounter: Payer: Self-pay | Admitting: *Deleted

## 2020-05-22 DIAGNOSIS — N184 Chronic kidney disease, stage 4 (severe): Secondary | ICD-10-CM | POA: Diagnosis not present

## 2020-05-23 DIAGNOSIS — N184 Chronic kidney disease, stage 4 (severe): Secondary | ICD-10-CM | POA: Diagnosis not present

## 2020-05-24 ENCOUNTER — Other Ambulatory Visit: Payer: Self-pay | Admitting: Student

## 2020-05-24 ENCOUNTER — Other Ambulatory Visit (HOSPITAL_COMMUNITY): Payer: Self-pay | Admitting: Internal Medicine

## 2020-05-24 DIAGNOSIS — N184 Chronic kidney disease, stage 4 (severe): Secondary | ICD-10-CM | POA: Diagnosis not present

## 2020-05-25 ENCOUNTER — Encounter: Payer: Self-pay | Admitting: Student

## 2020-05-25 DIAGNOSIS — N184 Chronic kidney disease, stage 4 (severe): Secondary | ICD-10-CM | POA: Diagnosis not present

## 2020-05-25 MED FILL — cloNIDine HCL 0.3 MG TABS: 0.3 | 30 days supply | Qty: 60 | Fill #5

## 2020-05-25 MED FILL — ATORVASTATIN 80 MG TABLET: 80 | 90 days supply | Qty: 90 | Fill #0

## 2020-05-25 MED FILL — VIT D2 1.25 MG (50,000 UNIT: 1.25 MG | 28 days supply | Qty: 8 | Fill #7

## 2020-05-26 DIAGNOSIS — N184 Chronic kidney disease, stage 4 (severe): Secondary | ICD-10-CM | POA: Diagnosis not present

## 2020-05-27 DIAGNOSIS — N184 Chronic kidney disease, stage 4 (severe): Secondary | ICD-10-CM | POA: Diagnosis not present

## 2020-05-28 DIAGNOSIS — N2581 Secondary hyperparathyroidism of renal origin: Secondary | ICD-10-CM | POA: Diagnosis not present

## 2020-05-28 DIAGNOSIS — E1129 Type 2 diabetes mellitus with other diabetic kidney complication: Secondary | ICD-10-CM | POA: Diagnosis not present

## 2020-05-28 DIAGNOSIS — N052 Unspecified nephritic syndrome with diffuse membranous glomerulonephritis: Secondary | ICD-10-CM | POA: Diagnosis not present

## 2020-05-28 DIAGNOSIS — I5022 Chronic systolic (congestive) heart failure: Secondary | ICD-10-CM | POA: Diagnosis not present

## 2020-05-28 DIAGNOSIS — N179 Acute kidney failure, unspecified: Secondary | ICD-10-CM | POA: Diagnosis not present

## 2020-05-28 DIAGNOSIS — N184 Chronic kidney disease, stage 4 (severe): Secondary | ICD-10-CM | POA: Diagnosis not present

## 2020-05-28 DIAGNOSIS — E785 Hyperlipidemia, unspecified: Secondary | ICD-10-CM | POA: Diagnosis not present

## 2020-05-28 DIAGNOSIS — I129 Hypertensive chronic kidney disease with stage 1 through stage 4 chronic kidney disease, or unspecified chronic kidney disease: Secondary | ICD-10-CM | POA: Diagnosis not present

## 2020-05-28 MED FILL — CEPHALEXIN 500 MG CAPSULE: 500 | 10 days supply | Qty: 20 | Fill #0

## 2020-05-29 ENCOUNTER — Telehealth: Payer: Self-pay | Admitting: Vascular Surgery

## 2020-05-29 DIAGNOSIS — N184 Chronic kidney disease, stage 4 (severe): Secondary | ICD-10-CM | POA: Diagnosis not present

## 2020-05-29 NOTE — Telephone Encounter (Signed)
Dr Edrick Oh sent a referral via [employer] to our office for pt to get a new access placement. According to Dr Jamse Mead Last OV note I did not schedule OV for consult I gave the referral packet to [facility] the surgery scheduler to get the patient scheduled for a fistula creation.    Sonya p.

## 2020-05-30 DIAGNOSIS — N184 Chronic kidney disease, stage 4 (severe): Secondary | ICD-10-CM | POA: Diagnosis not present

## 2020-05-31 ENCOUNTER — Telehealth: Payer: Self-pay

## 2020-05-31 DIAGNOSIS — N184 Chronic kidney disease, stage 4 (severe): Secondary | ICD-10-CM | POA: Diagnosis not present

## 2020-05-31 NOTE — Telephone Encounter (Signed)
Called pt to schedule his AVF surgery and pt stated he does not wish to schedule this yet and will call us when he is ready.

## 2020-05-31 NOTE — Telephone Encounter (Signed)
Contacted pt to schedule for Rt arm AVF with Dr. Donzetta Matters per referral from Dr. Justin Mend and last office visit with Dr. Donzetta Matters. Pt verbalized he wasn't ready to schedule surgery at this point because he's still making urine. Stated when it comes time for him to need fistula placed then he will schedule. Encouraged pt to discuss this with Dr. Justin Mend. Pt verbalized he has already told doctor feelings.   Contacted Katherine at Dr. Jason Nest office and informed of above information.

## 2020-06-01 DIAGNOSIS — N184 Chronic kidney disease, stage 4 (severe): Secondary | ICD-10-CM | POA: Diagnosis not present

## 2020-06-01 DIAGNOSIS — Z452 Encounter for adjustment and management of vascular access device: Secondary | ICD-10-CM | POA: Diagnosis not present

## 2020-06-02 DIAGNOSIS — N184 Chronic kidney disease, stage 4 (severe): Secondary | ICD-10-CM | POA: Diagnosis not present

## 2020-06-03 DIAGNOSIS — N184 Chronic kidney disease, stage 4 (severe): Secondary | ICD-10-CM | POA: Diagnosis not present

## 2020-06-04 DIAGNOSIS — N184 Chronic kidney disease, stage 4 (severe): Secondary | ICD-10-CM | POA: Diagnosis not present

## 2020-06-05 DIAGNOSIS — N184 Chronic kidney disease, stage 4 (severe): Secondary | ICD-10-CM | POA: Diagnosis not present

## 2020-06-06 DIAGNOSIS — N184 Chronic kidney disease, stage 4 (severe): Secondary | ICD-10-CM | POA: Diagnosis not present

## 2020-06-07 DIAGNOSIS — N184 Chronic kidney disease, stage 4 (severe): Secondary | ICD-10-CM | POA: Diagnosis not present

## 2020-06-08 ENCOUNTER — Other Ambulatory Visit (HOSPITAL_COMMUNITY): Payer: Self-pay | Admitting: Cardiology

## 2020-06-08 DIAGNOSIS — I5042 Chronic combined systolic (congestive) and diastolic (congestive) heart failure: Secondary | ICD-10-CM

## 2020-06-08 DIAGNOSIS — N184 Chronic kidney disease, stage 4 (severe): Secondary | ICD-10-CM | POA: Diagnosis not present

## 2020-06-08 MED FILL — hydrALAZINE HCL 100 MG TABS: 100 | 30 days supply | Qty: 90 | Fill #0

## 2020-06-08 MED FILL — NovoLOG 100 UNIT/ML SOLN: 100 | 22 days supply | Qty: 10 | Fill #8

## 2020-06-08 MED FILL — CARVEDILOL 25 MG TABLET: 25 | 30 days supply | Qty: 60 | Fill #2

## 2020-06-09 DIAGNOSIS — N184 Chronic kidney disease, stage 4 (severe): Secondary | ICD-10-CM | POA: Diagnosis not present

## 2020-06-10 DIAGNOSIS — N184 Chronic kidney disease, stage 4 (severe): Secondary | ICD-10-CM | POA: Diagnosis not present

## 2020-06-11 DIAGNOSIS — N184 Chronic kidney disease, stage 4 (severe): Secondary | ICD-10-CM | POA: Diagnosis not present

## 2020-06-12 DIAGNOSIS — N184 Chronic kidney disease, stage 4 (severe): Secondary | ICD-10-CM | POA: Diagnosis not present

## 2020-06-13 DIAGNOSIS — N184 Chronic kidney disease, stage 4 (severe): Secondary | ICD-10-CM | POA: Diagnosis not present

## 2020-06-14 DIAGNOSIS — N184 Chronic kidney disease, stage 4 (severe): Secondary | ICD-10-CM | POA: Diagnosis not present

## 2020-06-15 DIAGNOSIS — N184 Chronic kidney disease, stage 4 (severe): Secondary | ICD-10-CM | POA: Diagnosis not present

## 2020-06-16 DIAGNOSIS — N184 Chronic kidney disease, stage 4 (severe): Secondary | ICD-10-CM | POA: Diagnosis not present

## 2020-06-17 DIAGNOSIS — N184 Chronic kidney disease, stage 4 (severe): Secondary | ICD-10-CM | POA: Diagnosis not present

## 2020-06-18 DIAGNOSIS — N184 Chronic kidney disease, stage 4 (severe): Secondary | ICD-10-CM | POA: Diagnosis not present

## 2020-06-19 DIAGNOSIS — N184 Chronic kidney disease, stage 4 (severe): Secondary | ICD-10-CM | POA: Diagnosis not present

## 2020-06-20 DIAGNOSIS — N184 Chronic kidney disease, stage 4 (severe): Secondary | ICD-10-CM | POA: Diagnosis not present

## 2020-06-21 DIAGNOSIS — N184 Chronic kidney disease, stage 4 (severe): Secondary | ICD-10-CM | POA: Diagnosis not present

## 2020-06-22 DIAGNOSIS — N184 Chronic kidney disease, stage 4 (severe): Secondary | ICD-10-CM | POA: Diagnosis not present

## 2020-06-23 DIAGNOSIS — N184 Chronic kidney disease, stage 4 (severe): Secondary | ICD-10-CM | POA: Diagnosis not present

## 2020-06-24 DIAGNOSIS — N184 Chronic kidney disease, stage 4 (severe): Secondary | ICD-10-CM | POA: Diagnosis not present

## 2020-06-25 DIAGNOSIS — N184 Chronic kidney disease, stage 4 (severe): Secondary | ICD-10-CM | POA: Diagnosis not present

## 2020-06-26 DIAGNOSIS — N184 Chronic kidney disease, stage 4 (severe): Secondary | ICD-10-CM | POA: Diagnosis not present

## 2020-06-27 DIAGNOSIS — N184 Chronic kidney disease, stage 4 (severe): Secondary | ICD-10-CM | POA: Diagnosis not present

## 2020-06-28 DIAGNOSIS — N184 Chronic kidney disease, stage 4 (severe): Secondary | ICD-10-CM | POA: Diagnosis not present

## 2020-06-28 DIAGNOSIS — E1129 Type 2 diabetes mellitus with other diabetic kidney complication: Secondary | ICD-10-CM | POA: Diagnosis not present

## 2020-06-28 DIAGNOSIS — E785 Hyperlipidemia, unspecified: Secondary | ICD-10-CM | POA: Diagnosis not present

## 2020-06-28 DIAGNOSIS — I5022 Chronic systolic (congestive) heart failure: Secondary | ICD-10-CM | POA: Diagnosis not present

## 2020-06-28 DIAGNOSIS — N052 Unspecified nephritic syndrome with diffuse membranous glomerulonephritis: Secondary | ICD-10-CM | POA: Diagnosis not present

## 2020-06-28 DIAGNOSIS — N2581 Secondary hyperparathyroidism of renal origin: Secondary | ICD-10-CM | POA: Diagnosis not present

## 2020-06-28 DIAGNOSIS — I129 Hypertensive chronic kidney disease with stage 1 through stage 4 chronic kidney disease, or unspecified chronic kidney disease: Secondary | ICD-10-CM | POA: Diagnosis not present

## 2020-06-28 DIAGNOSIS — N179 Acute kidney failure, unspecified: Secondary | ICD-10-CM | POA: Diagnosis not present

## 2020-06-29 DIAGNOSIS — N184 Chronic kidney disease, stage 4 (severe): Secondary | ICD-10-CM | POA: Diagnosis not present

## 2020-06-29 MED FILL — cloNIDine HCL 0.3 MG TABS: 0.3 | 30 days supply | Qty: 60 | Fill #6

## 2020-06-29 MED FILL — ULTICARE INS 0.5 ML 30GX1/2: 30G X 1/2" | 25 days supply | Qty: 100 | Fill #1

## 2020-06-29 MED FILL — VIT D2 1.25 MG (50,000 UNIT: 1.25 MG | 28 days supply | Qty: 8 | Fill #8

## 2020-06-30 DIAGNOSIS — N184 Chronic kidney disease, stage 4 (severe): Secondary | ICD-10-CM | POA: Diagnosis not present

## 2020-07-01 DIAGNOSIS — N184 Chronic kidney disease, stage 4 (severe): Secondary | ICD-10-CM | POA: Diagnosis not present

## 2020-07-02 DIAGNOSIS — N184 Chronic kidney disease, stage 4 (severe): Secondary | ICD-10-CM | POA: Diagnosis not present

## 2020-07-03 DIAGNOSIS — N184 Chronic kidney disease, stage 4 (severe): Secondary | ICD-10-CM | POA: Diagnosis not present

## 2020-07-04 DIAGNOSIS — N184 Chronic kidney disease, stage 4 (severe): Secondary | ICD-10-CM | POA: Diagnosis not present

## 2020-07-05 DIAGNOSIS — N184 Chronic kidney disease, stage 4 (severe): Secondary | ICD-10-CM | POA: Diagnosis not present

## 2020-07-06 ENCOUNTER — Other Ambulatory Visit (HOSPITAL_COMMUNITY): Payer: Self-pay | Admitting: Internal Medicine

## 2020-07-06 ENCOUNTER — Other Ambulatory Visit: Payer: Self-pay | Admitting: Internal Medicine

## 2020-07-06 DIAGNOSIS — N184 Chronic kidney disease, stage 4 (severe): Secondary | ICD-10-CM | POA: Diagnosis not present

## 2020-07-06 MED FILL — hydrALAZINE HCL 100 MG TABS: 100 | 30 days supply | Qty: 90 | Fill #1

## 2020-07-06 MED FILL — glipiZIDE 5 MG TABS: 5 | 90 days supply | Qty: 90 | Fill #0

## 2020-07-06 MED FILL — CARVEDILOL 25 MG TABLET: 25 | 30 days supply | Qty: 60 | Fill #3

## 2020-07-06 MED FILL — NovoLOG 100 UNIT/ML SOLN: 100 | 22 days supply | Qty: 10 | Fill #9

## 2020-07-07 DIAGNOSIS — N184 Chronic kidney disease, stage 4 (severe): Secondary | ICD-10-CM | POA: Diagnosis not present

## 2020-07-08 DIAGNOSIS — N184 Chronic kidney disease, stage 4 (severe): Secondary | ICD-10-CM | POA: Diagnosis not present

## 2020-07-21 ENCOUNTER — Telehealth: Payer: Self-pay

## 2020-07-21 NOTE — Telephone Encounter (Signed)
Received referral from Dr. Justin Mend, for access placement per Dr. Donzetta Matters ok to schedule. No office visit.

## 2020-07-31 ENCOUNTER — Other Ambulatory Visit (HOSPITAL_COMMUNITY): Payer: Self-pay | Admitting: Cardiology

## 2020-07-31 MED FILL — VIT D2 1.25 MG (50,000 UNIT: 1.25 MG | 28 days supply | Qty: 8 | Fill #9

## 2020-08-01 ENCOUNTER — Other Ambulatory Visit (HOSPITAL_COMMUNITY): Payer: Self-pay | Admitting: Cardiology

## 2020-08-01 ENCOUNTER — Other Ambulatory Visit: Payer: Self-pay

## 2020-08-04 MED FILL — cloNIDine HCL 0.3 MG TABS: 0.3 | 30 days supply | Qty: 60 | Fill #0

## 2020-08-09 ENCOUNTER — Other Ambulatory Visit (HOSPITAL_COMMUNITY): Payer: Self-pay | Admitting: Nephrology

## 2020-08-09 DIAGNOSIS — N179 Acute kidney failure, unspecified: Secondary | ICD-10-CM | POA: Diagnosis not present

## 2020-08-09 DIAGNOSIS — N185 Chronic kidney disease, stage 5: Secondary | ICD-10-CM | POA: Diagnosis not present

## 2020-08-09 DIAGNOSIS — N2581 Secondary hyperparathyroidism of renal origin: Secondary | ICD-10-CM | POA: Diagnosis not present

## 2020-08-09 DIAGNOSIS — E1129 Type 2 diabetes mellitus with other diabetic kidney complication: Secondary | ICD-10-CM | POA: Diagnosis not present

## 2020-08-09 DIAGNOSIS — N052 Unspecified nephritic syndrome with diffuse membranous glomerulonephritis: Secondary | ICD-10-CM | POA: Diagnosis not present

## 2020-08-09 DIAGNOSIS — I120 Hypertensive chronic kidney disease with stage 5 chronic kidney disease or end stage renal disease: Secondary | ICD-10-CM | POA: Diagnosis not present

## 2020-08-09 DIAGNOSIS — E785 Hyperlipidemia, unspecified: Secondary | ICD-10-CM | POA: Diagnosis not present

## 2020-08-09 DIAGNOSIS — I5022 Chronic systolic (congestive) heart failure: Secondary | ICD-10-CM | POA: Diagnosis not present

## 2020-08-09 MED FILL — SODIUM BICARBONATE 650 MG T: 650 | 30 days supply | Qty: 90 | Fill #0

## 2020-08-10 MED FILL — ATORVASTATIN 80 MG TABLET: 80 | 90 days supply | Qty: 90 | Fill #1

## 2020-08-13 ENCOUNTER — Telehealth: Payer: Self-pay

## 2020-08-13 ENCOUNTER — Other Ambulatory Visit (HOSPITAL_COMMUNITY)
Admission: RE | Admit: 2020-08-13 | Discharge: 2020-08-13 | Disposition: A | Discharge status: Home / Self Care | Payer: Medicaid Other | Source: Ambulatory Visit | Attending: Vascular Surgery | Admitting: Vascular Surgery

## 2020-08-13 DIAGNOSIS — Z01812 Encounter for preprocedural laboratory examination: Secondary | ICD-10-CM | POA: Diagnosis not present

## 2020-08-13 DIAGNOSIS — Z20822 Contact with and (suspected) exposure to covid-19: Secondary | ICD-10-CM | POA: Diagnosis not present

## 2020-08-13 LAB — SARS CORONAVIRUS 2 (TAT 6-24 HRS): SARS Coronavirus 2: NEGATIVE

## 2020-08-13 NOTE — Telephone Encounter (Signed)
Pt call requesting to r/s his AVF surgery scheduled for tomorrow because of a lot of personal situations. Surgery moved to 09/04/20. New surgical letter mailed to patient.

## 2020-08-16 MED FILL — CARVEDILOL 25 MG TABLET: 25 | 30 days supply | Qty: 60 | Fill #4

## 2020-08-16 MED FILL — CALCIUM ACETATE 667 MG GELC: 667 | 30 days supply | Qty: 90 | Fill #2

## 2020-09-03 ENCOUNTER — Other Ambulatory Visit (HOSPITAL_COMMUNITY): Payer: Medicaid Other
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
## 2020-09-05 ENCOUNTER — Other Ambulatory Visit: Payer: Self-pay | Admitting: *Deleted

## 2020-09-05 ENCOUNTER — Other Ambulatory Visit (HOSPITAL_COMMUNITY): Payer: Self-pay | Admitting: Internal Medicine

## 2020-09-05 DIAGNOSIS — E1121 Type 2 diabetes mellitus with diabetic nephropathy: Secondary | ICD-10-CM

## 2020-09-05 MED ORDER — INSULIN GLARGINE 100 UNIT/ML ~~LOC~~ SOLN: SUBCUTANEOUS | 0 refills | Status: DC

## 2020-09-05 MED ORDER — INSULIN ASPART 100 UNIT/ML ~~LOC~~ SOLN: 20.0000 [IU] | Freq: Three times a day (TID) | SUBCUTANEOUS | 0 refills | Status: DC

## 2020-09-05 MED FILL — NovoLOG 100 UNIT/ML SOLN: 100 | 17 days supply | Qty: 10 | Fill #0

## 2020-09-05 MED FILL — cloNIDine HCL 0.3 MG TABS: 0.3 | 30 days supply | Qty: 60 | Fill #0

## 2020-09-05 MED FILL — LANTUS 100 UNITS/ML VIAL: 100 | 28 days supply | Qty: 10 | Fill #0

## 2020-09-05 NOTE — Telephone Encounter (Signed)
Placed refill but appears he may be progressing to dialysis so will have increased risk of hypoglycemia if so. He is also supposed to be on Victoza. Has not been seen since 6/8 visit, can we arrange follow-up for him?

## 2020-09-05 NOTE — Telephone Encounter (Signed)
Jeremy [name] stated pt had called for a refill on Lantus, but last filled on 6/8, so wanted to be sure pt is still on this med. Informed I will send a refill request to the doctor if appropriate to refill.

## 2020-09-05 NOTE — Telephone Encounter (Signed)
Please schedule pt a f/u appt per Dr Sharon Seller. Thanks

## 2020-09-07 ENCOUNTER — Other Ambulatory Visit (HOSPITAL_COMMUNITY): Payer: Self-pay | Admitting: Nephrology

## 2020-09-07 MED FILL — VIT D2 1.25 MG (50,000 UNIT: 1.25 MG | 28 days supply | Qty: 8 | Fill #0

## 2020-09-10 ENCOUNTER — Telehealth: Payer: Self-pay

## 2020-09-10 ENCOUNTER — Other Ambulatory Visit (HOSPITAL_COMMUNITY): Payer: Medicaid Other

## 2020-09-10 MED ORDER — DEXTROSE 5 % IV SOLN
3.0000 g | INTRAVENOUS | Status: DC
  Filled 2020-09-10: qty 3000

## 2020-09-10 MED FILL — hydrALAZINE HCL 100 MG TABS: 100 | 30 days supply | Qty: 90 | Fill #2
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
## 2020-09-10 NOTE — Telephone Encounter (Signed)
Per Helene Kelp with [facility] she spoke with patient and he states he would like to cx his procedure for tomorrow due to the weather. She advised pt to contact our office to reschedule this procedure.   I've given this message to our surgery scheduler Eilleen Kempf, RN.

## 2020-09-13 ENCOUNTER — Other Ambulatory Visit (HOSPITAL_COMMUNITY): Payer: Self-pay | Admitting: Cardiology

## 2020-09-13 MED FILL — CARVEDILOL 25 MG TABLET: 25 | 30 days supply | Qty: 60 | Fill #0

## 2020-09-17 ENCOUNTER — Encounter (HOSPITAL_COMMUNITY): Payer: Self-pay | Admitting: Anesthesiology

## 2020-09-17 ENCOUNTER — Other Ambulatory Visit (HOSPITAL_COMMUNITY): Payer: Medicaid Other

## 2020-09-17 ENCOUNTER — Encounter (HOSPITAL_COMMUNITY): Payer: Self-pay | Admitting: Vascular Surgery

## 2020-09-17 ENCOUNTER — Other Ambulatory Visit: Payer: Self-pay

## 2020-09-17 NOTE — Anesthesia Preprocedure Evaluation (Deleted)
Anesthesia Evaluation    Airway        Dental   Pulmonary           Cardiovascular hypertension, Pt. on medications and Pt. on home beta blockers   Non-ischemic cardiomyopathy with normal coronaries 2016, EF 20-25% 06/2019 ECHO: EF 55-60%, no significant valvular abnormalities   Neuro/Psych Seizures -,  Depression    GI/Hepatic GERD  Medicated,  Endo/Other  diabetes, Oral Hypoglycemic Agents, Insulin Dependentobese  Renal/GU ESRF and DialysisRenal disease     Musculoskeletal   Abdominal   Peds  Hematology   Anesthesia Other Findings   Reproductive/Obstetrics                            Anesthesia Physical Anesthesia Plan  ASA: III  Anesthesia Plan: Regional   Post-op Pain Management:    Induction:   PONV Risk Score and Plan: 1 and Ondansetron  Airway Management Planned: Natural Airway and Simple Face Mask  Additional Equipment:   Intra-op Plan:   Post-operative Plan:   Informed Consent:   Plan Discussed with:   Anesthesia Plan Comments: (PAT note written 09/17/2020 by Myra Gianotti, PA-C. )       Anesthesia Quick Evaluation

## 2020-09-17 NOTE — Progress Notes (Signed)
[name] denies chest painor shortness of breath. Patient denies any s/s of Covid and has not been around anyone with Covid to his knowledge.  [name] missed Covid test today, "I'm dealing with a lot of things; a loved one of mine died." I told patient to make sure he is at the hospital at [place].  [name] has type II diabetes. Patient reports that CBG's run 140- 300.  I told patient to try to keep it under 180 after surgery to prevent infection and not slow dow healing.  I instructed [name] to take 18 units of Lantus tonight and to not take Glypizide in am.  I instructed patient to check CBG after awaking and every 2 hours until arrival  to the hospital. I Instructed patient if CBG is less than 70 to drink 1/2 cup of a clear juice. Recheck CBG in 15 minutes then call pre- op desk at [phone] for further instructions.
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
## 2020-09-18 ENCOUNTER — Ambulatory Visit (HOSPITAL_COMMUNITY): Admission: RE | Admit: 2020-09-18 | Payer: Medicaid Other | Source: Home / Self Care | Admitting: Vascular Surgery

## 2020-09-18 HISTORY — DX: Depression, unspecified: F32.A

## 2020-09-18 HISTORY — DX: Gastro-esophageal reflux disease without esophagitis: K21.9

## 2020-09-18 SURGERY — ARTERIOVENOUS (AV) FISTULA CREATION
Anesthesia: Monitor Anesthesia Care | Laterality: Right

## 2020-09-21 ENCOUNTER — Other Ambulatory Visit: Payer: Self-pay

## 2020-09-21 DIAGNOSIS — N184 Chronic kidney disease, stage 4 (severe): Secondary | ICD-10-CM

## 2020-09-28 ENCOUNTER — Encounter (HOSPITAL_COMMUNITY): Payer: Medicaid Other

## 2020-09-28 ENCOUNTER — Encounter: Payer: Medicaid Other | Admitting: Vascular Surgery

## 2020-10-01 ENCOUNTER — Encounter: Payer: Medicaid Other | Admitting: Internal Medicine

## 2020-10-02 ENCOUNTER — Other Ambulatory Visit: Payer: Self-pay | Admitting: *Deleted

## 2020-10-02 ENCOUNTER — Other Ambulatory Visit: Payer: Self-pay

## 2020-10-02 NOTE — Patient Outreach (Signed)
Care Coordination  10/02/2020  Jeremy [name] 11/14/1980 [id number]   Successful telephone outreach with [name] today, however he is unable to talk at this time and requested to reschedule this visit. RNCM rescheduled for 10/05/20 @ 9:30am. Pt agreed to this appointment time. RNCM will follow up for initial intake at rescheduled time.  Lurena Joiner RN, BSN [place]  [employer] manager

## 2020-10-05 ENCOUNTER — Other Ambulatory Visit: Payer: Self-pay | Admitting: *Deleted

## 2020-10-05 ENCOUNTER — Other Ambulatory Visit: Payer: Self-pay | Admitting: Internal Medicine

## 2020-10-05 DIAGNOSIS — E1121 Type 2 diabetes mellitus with diabetic nephropathy: Secondary | ICD-10-CM

## 2020-10-05 MED FILL — cloNIDine HCL 0.3 MG TABS: 0.3 | 30 days supply | Qty: 60 | Fill #1

## 2020-10-05 MED FILL — VIT D2 1.25 MG (50,000 UNIT: 1.25 MG | 28 days supply | Qty: 8 | Fill #1

## 2020-10-05 MED FILL — SODIUM BICARBONATE 650 MG T: 650 | 30 days supply | Qty: 90 | Fill #1

## 2020-10-05 NOTE — Patient Outreach (Signed)
Care Coordination  10/05/2020  Jeremy [name] 12/01/1980 [id number]    Medicaid Managed Care   Unsuccessful Outreach Note  10/05/2020 Name: Jeremy [name] MRN: [id number] DOB: 1981-07-26  Referred by: Cato Mulligan, MD Reason for referral : High Risk Managed Medicaid (Unsuccessful RNCM initial outreach)   An unsuccessful telephone outreach was attempted today. The patient was referred to the case management team for assistance with care management and care coordination.   Follow Up Plan: The care management team will reach out to the patient again over the next 7-14 days.   Lurena Joiner RN, BSN [place]  [employer] manager

## 2020-10-05 NOTE — Patient Instructions (Signed)
Visit Information  Mr. Custer Sestito  - as a part of your Medicaid benefit, you are eligible for care management and care coordination services at no cost or copay. I was unable to reach you by phone today but would be happy to help you with your health related needs. Please feel free to call me @ [phone].   A member of the Managed Medicaid care management team will reach out to you again over the next 7-14 days.   Lurena Joiner RN, BSN [place]  [employer] manager

## 2020-10-08 ENCOUNTER — Encounter: Payer: Medicaid Other | Admitting: Student

## 2020-10-08 ENCOUNTER — Other Ambulatory Visit (HOSPITAL_COMMUNITY): Payer: Self-pay | Admitting: Student

## 2020-10-08 MED FILL — glipiZIDE 5 MG TABS: 5 | 90 days supply | Qty: 90 | Fill #0

## 2020-10-08 NOTE — Telephone Encounter (Signed)
Appointment has already been rescheduled for 10/15/2020 at 9:15 am with Dr. Wynetta Emery.

## 2020-10-08 NOTE — Telephone Encounter (Signed)
No show today. Reschedule his f/u appt.

## 2020-10-09 ENCOUNTER — Inpatient Hospital Stay (HOSPITAL_COMMUNITY): Admission: RE | Admit: 2020-10-09 | Payer: Medicaid Other | Source: Ambulatory Visit

## 2020-10-15 ENCOUNTER — Other Ambulatory Visit: Payer: Self-pay

## 2020-10-15 ENCOUNTER — Encounter: Payer: Self-pay | Admitting: Student

## 2020-10-15 ENCOUNTER — Ambulatory Visit: Payer: Medicaid Other | Admitting: Student

## 2020-10-15 ENCOUNTER — Other Ambulatory Visit (HOSPITAL_COMMUNITY): Payer: Self-pay | Admitting: Student

## 2020-10-15 VITALS — BP 150/90 | HR 66 | Temp 98.0°F | Ht 77.0 in | Wt 297.7 lb

## 2020-10-15 DIAGNOSIS — E7849 Other hyperlipidemia: Secondary | ICD-10-CM

## 2020-10-15 DIAGNOSIS — I5042 Chronic combined systolic (congestive) and diastolic (congestive) heart failure: Secondary | ICD-10-CM

## 2020-10-15 DIAGNOSIS — E1121 Type 2 diabetes mellitus with diabetic nephropathy: Secondary | ICD-10-CM

## 2020-10-15 DIAGNOSIS — I10 Essential (primary) hypertension: Secondary | ICD-10-CM | POA: Diagnosis not present

## 2020-10-15 DIAGNOSIS — Z23 Encounter for immunization: Secondary | ICD-10-CM | POA: Diagnosis not present

## 2020-10-15 DIAGNOSIS — N185 Chronic kidney disease, stage 5: Secondary | ICD-10-CM | POA: Diagnosis not present

## 2020-10-15 LAB — POCT GLYCOSYLATED HEMOGLOBIN (HGB A1C): Hemoglobin A1C: 7.2 % — AB (ref 4.0–5.6)

## 2020-10-15 LAB — GLUCOSE, CAPILLARY: Glucose-Capillary: 195 mg/dL — ABNORMAL HIGH (ref 70–99)

## 2020-10-15 MED ORDER — INSULIN ASPART 100 UNIT/ML ~~LOC~~ SOLN: 17.0000 [IU] | Freq: Three times a day (TID) | SUBCUTANEOUS | 3 refills | Status: DC

## 2020-10-15 MED ORDER — ULTICARE INSULIN SYRINGE 30G X 1/2" 0.5 ML MISC
3 refills | Status: DC
Start: 2020-10-15 — End: 2021-10-04

## 2020-10-15 MED ORDER — INSULIN GLARGINE 100 UNIT/ML ~~LOC~~ SOLN: SUBCUTANEOUS | 3 refills | Status: DC

## 2020-10-15 MED ORDER — ATORVASTATIN CALCIUM 80 MG PO TABS: 80.0000 mg | ORAL_TABLET | Freq: Every day | ORAL | 3 refills | Status: DC

## 2020-10-15 MED ORDER — AMLODIPINE BESYLATE 10 MG PO TABS: 10.0000 mg | ORAL_TABLET | Freq: Every day | ORAL | 3 refills | Status: DC

## 2020-10-15 MED ORDER — GLIPIZIDE 5 MG PO TABS: 5.0000 mg | ORAL_TABLET | Freq: Every day | ORAL | 3 refills | Status: DC

## 2020-10-15 MED FILL — AMLODIPINE BESYLATE 10 MG T: 10 | 90 days supply | Qty: 90 | Fill #0

## 2020-10-15 MED FILL — NovoLOG 100 UNIT/ML SOLN: 100 | 20 days supply | Qty: 10 | Fill #0

## 2020-10-15 MED FILL — ULTICARE INS 0.5 ML 30GX1/2: 30G X 1/2" | 25 days supply | Qty: 100 | Fill #0

## 2020-10-15 MED FILL — LANTUS 100 UNITS/ML VIAL: 100 | 27 days supply | Qty: 10 | Fill #0

## 2020-10-15 NOTE — Assessment & Plan Note (Signed)
Patient able to teach back current regimen of lantus 35 units nightly, novolog 17 units three times daily with meals and glipizide '5mg'$  daily. He states that he has been checking his blood glucose readings at home which have ranged from the mid 100s to the mid 200s. Hemoglobin A1c improved today from >14 approximately two years ago to 7.2. -Increase lantus to 37 units nightly -Continue novolog 17 units three times daily with meals -Continue glipizide '5mg'$  daily

## 2020-10-15 NOTE — Assessment & Plan Note (Signed)
Patient reports that he has been adherent to atorvastatin '80mg'$  daily. He has not had repeat lipid profile in approximately two years. -Lipid profile today -Continue atorvastatin '80mg'$  daily

## 2020-10-15 NOTE — Assessment & Plan Note (Signed)
Patient previously on dialysis for several months, now following closely with [facility] and holding off on additional dialysis at this time. Patient's last BMP from November reveals GFR of 11. Patient reports that he is making appropriate urine. -Continue following closely with [facility] -Continue management of chronic medical conditions per respective problem

## 2020-10-15 NOTE — Progress Notes (Signed)
   CC: Re-establish care  HPI:  [name] is a 40 y.o. with past medical history significant for CKD5 (no longer on dialysis), uncontrolled HTN, diastolic congestive heart failure, T2DM who presents to clinic following 2-year care gap to re-establish care. Refer to problem list for charting of this encounter.  Past Medical History:  Diagnosis Date  . Abscess of left groin   . Acute blood loss anemia 11/11/2013  . Anemia in chronic kidney disease (CKD)   . Asthma   . Boil of scrotum 11/21/2015  . Chest pain    a. 01/2015 Lexiscan MV: EF 28%, inferior, inferolateral, apical ischemia;  b. 01/2015 Cath: nl cors, PCWP 18 mmHg, CO 9.38 L/min, CI 3.53 L/min/m^2.  . Depression    Situational  . Essential hypertension   . GERD (gastroesophageal reflux disease)   . Hyperlipidemia   . Membranous glomerulonephritis   . Morbid obesity ([place])   . Nonischemic cardiomyopathy ([place])    a. 01/2015 Echo: EF 20-25%, diff HK, Gr 2 DD, Triv AI, mildly dil LA and Ao root.  . Seizures ([place]) 02/29/2020  . Type II diabetes mellitus ([place])    a. 01/2015 HbA1c = 8.9.   Review of Systems:  Denies polyuria, polydipsia, chest pain, palpitations, shortness of breath, lightheadedness, dizziness.  Physical Exam:  Vitals:   10/15/20 0914 10/15/20 0943  BP: (!) 184/112 (!) 150/90  Pulse: 72 66  Temp: 98 F (36.7 C)   TempSrc: Oral   SpO2: 100% 100%  Weight: 297 lb 11.2 oz (135 kg) 142 lb 12.8 oz (64.8 kg)   Physical Exam Constitutional:      General: He is not in acute distress.    Appearance: He is not ill-appearing.  Cardiovascular:     Rate and Rhythm: Normal rate and regular rhythm.     Pulses: Normal pulses.     Heart sounds: Normal heart sounds.  Pulmonary:     Effort: Pulmonary effort is normal.     Breath sounds: Normal breath sounds.  Abdominal:     General: Abdomen is flat. Bowel sounds are normal.     Palpations: Abdomen is soft.     Tenderness: There is no abdominal tenderness.   Neurological:     General: No focal deficit present.     Mental Status: He is alert. Mental status is at baseline.  Psychiatric:        Mood and Affect: Mood normal.        Behavior: Behavior normal.    Assessment & Plan:   See Encounters Tab for problem based charting.  Patient discussed with Dr. Heber Lopeno

## 2020-10-15 NOTE — Assessment & Plan Note (Signed)
Discussed importance of following up closely with cardiology as patient has missed scheduled follow-up appointment. -Continue current plan of care.

## 2020-10-15 NOTE — Assessment & Plan Note (Signed)
Patient's blood pressure elevated to 184/112 then 150/90 on repeat. He was effectively able to teach back his current antihypertensive medication regimen of carvedilol '25mg'$  twice daily, clonidine 0.'3mg'$  twice daily, hydralazine '100mg'$  three times daily. He reports that he was unable to tolerate the nifedipine which was started by cardiology approximately one year ago and has been not taking this medication for several months. Patient did not follow-up with cardiology at recent scheduled appointment. -Continue carvedilol '25mg'$  twice daily (cardiology prescribing) -Continue hydralazine '100mg'$  three times daily (cardiology prescribing) -Continue clonidine 0.'3mg'$  twice daily (cardiology prescribing) -Restart amlodipine '10mg'$  daily -Discussed the importance of following up closely with cardiology

## 2020-10-15 NOTE — Patient Instructions (Signed)
Mr. Segar,  It was a pleasure meeting you in clinic today.  For your diabetes: Continue taking glipizide '5mg'$  daily and novolog 17 units with meals. We ask that you go up on your lantus from 35 units nightly to 37 units nightly. Continue checking your blood glucose readings at home and keep track of them so that we can adjust your regimen based on your readings.  For your hypertension/heart failure: Please schedule a follow-up appointment with cardiology. Continue taking the clonidine, hydralazine and carvedilol. We ask that you restart the amlodipine '10mg'$  daily instead of the nicardipine. We will obtain a basic metabolic panel today to assess your kidney function and electrolytes.  For your hyperlipidemia (high cholesterol): Continue taking the atorvastatin '80mg'$  daily. We will obtain a lipid profile today.  I have sent refills to [facility] pharmacy which should be available for pickup later today. If you have any questions or concerns, please don't hesitate to reach out.  Sincerely, Dr. Paulla Dolly, MD

## 2020-10-16 LAB — BMP8+ANION GAP
Anion Gap: 19 mmol/L — ABNORMAL HIGH (ref 10.0–18.0)
BUN/Creatinine Ratio: 8 — ABNORMAL LOW (ref 9–20)
BUN: 62 mg/dL — ABNORMAL HIGH (ref 6–24)
CO2: 16 mmol/L — ABNORMAL LOW (ref 20–29)
Calcium: 8.9 mg/dL (ref 8.7–10.2)
Chloride: 106 mmol/L (ref 96–106)
Creatinine, Ser: 7.53 mg/dL — ABNORMAL HIGH (ref 0.76–1.27)
GFR calc Af Amer: 9 mL/min/{1.73_m2} — ABNORMAL LOW (ref >59)
GFR calc non Af Amer: 8 mL/min/{1.73_m2} — ABNORMAL LOW (ref >59)
Glucose: 199 mg/dL — ABNORMAL HIGH (ref 65–99)
Potassium: 4.5 mmol/L (ref 3.5–5.2)
Sodium: 141 mmol/L (ref 134–144)

## 2020-10-16 LAB — LIPID PANEL
Chol/HDL Ratio: 5.4 ratio — ABNORMAL HIGH (ref 0.0–5.0)
Cholesterol, Total: 152 mg/dL (ref 100–199)
HDL: 28 mg/dL — ABNORMAL LOW (ref >39)
LDL Chol Calc (NIH): 99 mg/dL (ref 0–99)
Triglycerides: 141 mg/dL (ref 0–149)
VLDL Cholesterol Cal: 25 mg/dL (ref 5–40)

## 2020-10-17 DIAGNOSIS — E785 Hyperlipidemia, unspecified: Secondary | ICD-10-CM | POA: Diagnosis not present

## 2020-10-17 DIAGNOSIS — N179 Acute kidney failure, unspecified: Secondary | ICD-10-CM | POA: Diagnosis not present

## 2020-10-17 DIAGNOSIS — N185 Chronic kidney disease, stage 5: Secondary | ICD-10-CM | POA: Diagnosis not present

## 2020-10-17 DIAGNOSIS — E1129 Type 2 diabetes mellitus with other diabetic kidney complication: Secondary | ICD-10-CM | POA: Diagnosis not present

## 2020-10-17 DIAGNOSIS — I120 Hypertensive chronic kidney disease with stage 5 chronic kidney disease or end stage renal disease: Secondary | ICD-10-CM | POA: Diagnosis not present

## 2020-10-17 DIAGNOSIS — N052 Unspecified nephritic syndrome with diffuse membranous glomerulonephritis: Secondary | ICD-10-CM | POA: Diagnosis not present

## 2020-10-17 DIAGNOSIS — N2581 Secondary hyperparathyroidism of renal origin: Secondary | ICD-10-CM | POA: Diagnosis not present

## 2020-10-17 DIAGNOSIS — I5022 Chronic systolic (congestive) heart failure: Secondary | ICD-10-CM | POA: Diagnosis not present

## 2020-10-17 NOTE — Progress Notes (Signed)
Internal Medicine Clinic Attending  Case discussed with Dr. Johnson  At the time of the visit.  We reviewed the resident's history and exam and pertinent patient test results.  I agree with the assessment, diagnosis, and plan of care documented in the resident's note.  

## 2020-10-19 ENCOUNTER — Other Ambulatory Visit (HOSPITAL_COMMUNITY): Payer: Self-pay | Admitting: Cardiology

## 2020-10-19 MED FILL — CARVEDILOL 25 MG TABLET: 25 | 30 days supply | Qty: 60 | Fill #0

## 2020-11-01 ENCOUNTER — Other Ambulatory Visit (HOSPITAL_COMMUNITY): Payer: Self-pay | Admitting: Cardiology

## 2020-11-01 DIAGNOSIS — I5042 Chronic combined systolic (congestive) and diastolic (congestive) heart failure: Secondary | ICD-10-CM

## 2020-11-01 MED FILL — hydrALAZINE HCL 100 MG TABS: 100 | 60 days supply | Qty: 180 | Fill #0

## 2020-11-09 MED FILL — cloNIDine HCL 0.3 MG TABS: 0.3 | 30 days supply | Qty: 60 | Fill #2

## 2020-11-12 MED FILL — SODIUM BICARBONATE 650 MG T: 650 | 30 days supply | Qty: 90 | Fill #2

## 2020-11-14 MED FILL — ATORVASTATIN 80 MG TABLET: 80 | 90 days supply | Qty: 90 | Fill #0

## 2020-11-15 ENCOUNTER — Other Ambulatory Visit (HOSPITAL_COMMUNITY): Payer: Self-pay | Admitting: Nephrology

## 2020-11-15 DIAGNOSIS — N2581 Secondary hyperparathyroidism of renal origin: Secondary | ICD-10-CM | POA: Diagnosis not present

## 2020-11-15 DIAGNOSIS — I120 Hypertensive chronic kidney disease with stage 5 chronic kidney disease or end stage renal disease: Secondary | ICD-10-CM | POA: Diagnosis not present

## 2020-11-15 DIAGNOSIS — D631 Anemia in chronic kidney disease: Secondary | ICD-10-CM | POA: Diagnosis not present

## 2020-11-15 DIAGNOSIS — N185 Chronic kidney disease, stage 5: Secondary | ICD-10-CM | POA: Diagnosis not present

## 2020-11-15 DIAGNOSIS — E1129 Type 2 diabetes mellitus with other diabetic kidney complication: Secondary | ICD-10-CM | POA: Diagnosis not present

## 2020-11-15 MED FILL — ISOSORBIDE MN ER 30 MG TAB: 30 | 30 days supply | Qty: 30 | Fill #0

## 2020-11-15 MED FILL — VIT D2 1.25 MG (50,000 UNIT: 1.25 MG | 28 days supply | Qty: 8 | Fill #2

## 2020-11-19 ENCOUNTER — Telehealth: Payer: Self-pay

## 2020-11-19 NOTE — Telephone Encounter (Signed)
Pt contacted office ready to r/s right arm AVF. Pt has cancelled procedure multiple times since last office on 04/06/20. Advised pt he will need to be seen in office prior to rescheduling surgery. Pt verbalized understanding.   Per Dr. Donzetta Matters, pt will need to repeat vein mapping. Information sent to scheduling to contact pt for appointment.

## 2020-11-22 NOTE — Addendum Note (Signed)
Addended by: Hulan Fray on: 11/22/2020 05:17 PM   Modules accepted: Orders

## 2020-11-23 ENCOUNTER — Other Ambulatory Visit (HOSPITAL_COMMUNITY): Payer: Self-pay | Admitting: Cardiology

## 2020-11-28 ENCOUNTER — Other Ambulatory Visit: Payer: Self-pay

## 2020-11-28 DIAGNOSIS — N184 Chronic kidney disease, stage 4 (severe): Secondary | ICD-10-CM

## 2020-11-30 ENCOUNTER — Ambulatory Visit (HOSPITAL_COMMUNITY): Payer: Medicaid Other

## 2020-11-30 ENCOUNTER — Encounter (HOSPITAL_COMMUNITY): Payer: Medicaid Other

## 2020-11-30 ENCOUNTER — Ambulatory Visit: Payer: Medicaid Other | Admitting: Vascular Surgery

## 2020-12-06 MED FILL — cloNIDine HCL 0.3 MG TABS: 0.3 | 30 days supply | Qty: 60 | Fill #3

## 2020-12-06 MED FILL — SODIUM BICARBONATE 650 MG T: 650 | 30 days supply | Qty: 90 | Fill #3

## 2020-12-07 ENCOUNTER — Other Ambulatory Visit: Payer: Self-pay

## 2020-12-07 ENCOUNTER — Ambulatory Visit (INDEPENDENT_AMBULATORY_CARE_PROVIDER_SITE_OTHER): Payer: Medicaid Other | Admitting: Vascular Surgery

## 2020-12-07 ENCOUNTER — Ambulatory Visit (INDEPENDENT_AMBULATORY_CARE_PROVIDER_SITE_OTHER)
Admission: RE | Admit: 2020-12-07 | Discharge: 2020-12-07 | Disposition: A | Discharge status: Home / Self Care | Payer: Medicaid Other | Source: Ambulatory Visit | Attending: Vascular Surgery | Admitting: Vascular Surgery

## 2020-12-07 ENCOUNTER — Ambulatory Visit (HOSPITAL_COMMUNITY)
Admission: RE | Admit: 2020-12-07 | Discharge: 2020-12-07 | Disposition: A | Discharge status: Home / Self Care | Payer: Medicaid Other | Source: Ambulatory Visit | Attending: Vascular Surgery | Admitting: Vascular Surgery

## 2020-12-07 ENCOUNTER — Encounter: Payer: Self-pay | Admitting: Vascular Surgery

## 2020-12-07 ENCOUNTER — Telehealth: Payer: Self-pay

## 2020-12-07 VITALS — BP 149/93 | HR 74 | Temp 98.3°F | Resp 20 | Ht 77.0 in | Wt 299.7 lb

## 2020-12-07 DIAGNOSIS — N184 Chronic kidney disease, stage 4 (severe): Secondary | ICD-10-CM

## 2020-12-07 NOTE — Progress Notes (Signed)
Patient ID: Jeremy [name], male   DOB: [birth year], 40 y.o.   MRN: [id number]  Reason for Consult: Follow-up   Referred by Cato Mulligan, MD  Subjective:     HPI:  Jeremy [name] is a 40 y.o. male previous history of temporary dialysis via right chest catheter.  Has not needed dialysis since then.  We previously planned for dialysis access but patient has deferred.  Denies any history of chest, breast or upper extremity surgery.  He is left-hand dominant.  Currently no swelling but is experiencing some shortness of breath.  He does not take any blood thinners.  Past Medical History:  Diagnosis Date  . Abscess of left groin   . Acute blood loss anemia 11/11/2013  . Anemia in chronic kidney disease (CKD)   . Asthma   . Boil of scrotum 11/21/2015  . Chest pain    a. 01/2015 Lexiscan MV: EF 28%, inferior, inferolateral, apical ischemia;  b. 01/2015 Cath: nl cors, PCWP 18 mmHg, CO 9.38 L/min, CI 3.53 L/min/m^2.  . Depression    Situational  . Essential hypertension   . GERD (gastroesophageal reflux disease)   . Hyperlipidemia   . Membranous glomerulonephritis   . Morbid obesity ([place])   . Nonischemic cardiomyopathy ([place])    a. 01/2015 Echo: EF 20-25%, diff HK, Gr 2 DD, Triv AI, mildly dil LA and Ao root.  . Seizures ([place]) 02/29/2020  . Type II diabetes mellitus ([place])    a. 01/2015 HbA1c = 8.9.   Family History  Problem Relation Age of Onset  . Diabetes Mellitus II Mother        died @ 15.  . Gastric cancer Mother   . CAD Father        died @ 39.  [name] Heart attack Father   . Congestive Heart Failure Father   . Diabetes Mellitus II Sister   . CAD Sister        s/p PCI - age 45.   Past Surgical History:  Procedure Laterality Date  . CARDIAC CATHETERIZATION N/A 02/01/2015   Procedure: Right/Left Heart Cath and Coronary Angiography;  Surgeon: Josue Hector, MD;  Location: [facility];  Service: Cardiovascular;  Laterality: N/A;  . IR FLUORO GUIDE CV LINE RIGHT  10/11/2019   . IR US GUIDE VASC ACCESS RIGHT  10/11/2019  . THORACOTOMY Left 11/08/2013   Procedure: LEFT THORACOTOMY;  Surgeon: Ivin Poot, MD;  Location: [facility];  Service: Thoracic;  Laterality: Left;    Short Social History:  Social History   Tobacco Use  . Smoking status: Never Smoker  . Smokeless tobacco: Never Used  Substance Use Topics  . Alcohol use: No    Alcohol/week: 0.0 standard drinks    Allergies  Allergen Reactions  . Acyclovir And Related Other (See Comments)    Unknown reaction    Current Outpatient Medications  Medication Sig Dispense Refill  . Accu-Chek FastClix Lancets MISC Check your blood sugars 4 times a day. 402 each 1  . ACCU-CHEK GUIDE test strip USE TO CHECK BLOOD SUGAR 4 TIMES DAILY 375 strip 1  . acetaminophen (TYLENOL) 500 MG tablet Take 1,000 mg by mouth every 6 (six) hours as needed for fever or headache (pain).    [name] amLODipine (NORVASC) 10 MG tablet Take 1 tablet (10 mg total) by mouth daily. 90 tablet 3  . atorvastatin (LIPITOR) 80 MG tablet Take 1 tablet (80 mg total) by mouth daily. 90 tablet 3  . blood  glucose meter kit and supplies KIT Dispense based on patient and insurance preference. Use up to four times daily as directed. (FOR ICD-9 250.00, 250.01). 1 each 0  . carvedilol (COREG) 25 MG tablet Take 1 tablet (25 mg total) by mouth 2 (two) times daily with a meal. Needs appt for further refills 60 tablet 2  . cloNIDine (CATAPRES) 0.3 MG tablet TAKE 1 TABLET (0.3 MG TOTAL) BY MOUTH TWICE A DAY 60 tablet 6  . famotidine (PEPCID) 10 MG tablet Take 1 tablet (10 mg total) by mouth daily as needed for heartburn or indigestion. (Patient taking differently: Take 10 mg by mouth daily.) 90 tablet 3  . glipiZIDE (GLUCOTROL) 5 MG tablet Take 1 tablet (5 mg total) by mouth daily. 90 tablet 3  . hydrALAZINE (APRESOLINE) 100 MG tablet Take 1 tablet (100 mg total) by mouth 3 (three) times daily. Needs appointment for further refill. 180 tablet 0  . insulin aspart  (NOVOLOG) 100 UNIT/ML injection Inject 17 Units into the skin 3 (three) times daily with meals. 10 mL 3  . insulin glargine (LANTUS) 100 UNIT/ML injection Inject 37 units at bedtime 10 mL 3  . Insulin Syringe-Needle U-100 (ULTICARE INSULIN SYRINGE) 30G X 1/2" 0.5 ML MISC USE 4 TIMES DAILY WITH INSULIN INJECTIONS. 100 each 3  . nitroGLYCERIN (NITROSTAT) 0.4 MG SL tablet PLACE 1 TABLET UNDER THE TONGUE EVERY 5 MINUTES AS NEEDED FOR CHEST PAIN. (Patient taking differently: Place 0.4 mg under the tongue every 5 (five) minutes as needed for chest pain.) 25 tablet 3  . UNIFINE PENTIPS 32G X 4 MM MISC USE ONCE DAILY WITH VICTOZA 100 each 1  . Vitamin D, Ergocalciferol, (DRISDOL) 1.25 MG (50000 UNIT) CAPS capsule Take 50,000 Units by mouth See admin instructions. Take one capsule (50000 units) by mouth twice weekly - on Wednesday and Sunday     No current facility-administered medications for this visit.   Facility-Administered Medications Ordered in Other Visits  Medication Dose Route Frequency Provider Last Rate Last Admin  . ceFAZolin (ANCEF) 3 g in dextrose 5 % 50 mL IVPB  3 g Intravenous 30 min Pre-Op Waynetta Sandy, MD        Review of Systems  Constitutional:  Constitutional negative. HENT: HENT negative.  Eyes: Eyes negative.  Respiratory: Positive for shortness of breath.  GI: Gastrointestinal negative.  Musculoskeletal: Musculoskeletal negative.  Skin: Skin negative.  Neurological: Neurological negative. Hematologic: Hematologic/lymphatic negative.  Psychiatric: Psychiatric negative.        Objective:  Objective   Vitals:   12/07/20 1539  BP: (!) 149/93  Pulse: 74  Resp: 20  Temp: 98.3 F (36.8 C)  SpO2: 97%  Weight: 299 lb 11.2 oz (135.9 kg)  Height: _0  (1.956 m)   Body mass index is 35.54 kg/m.  Physical Exam HENT:     Head: Normocephalic.     Nose:     Comments: Wearing a mask Eyes:     Pupils: Pupils are equal, round, and reactive to light.   Cardiovascular:     Rate and Rhythm: Normal rate.     Pulses: Normal pulses.  Pulmonary:     Effort: Pulmonary effort is normal.  Abdominal:     Palpations: Abdomen is soft.  Skin:    General: Skin is warm.     Capillary Refill: Capillary refill takes less than 2 seconds.  Neurological:     General: No focal deficit present.     Mental Status: He is alert.  Psychiatric:        Mood and Affect: Mood normal.        Behavior: Behavior normal.        Thought Content: Thought content normal.        Judgment: Judgment normal.     Data: Right Cephalic  Diameter (cm)Depth (cm)Findings  +-----------------+-------------+----------+--------+  Shoulder       0.61              +-----------------+-------------+----------+--------+  Prox upper arm    0.62              +-----------------+-------------+----------+--------+  Mid upper arm    0.57              +-----------------+-------------+----------+--------+  Dist upper arm    0.55              +-----------------+-------------+----------+--------+  Antecubital fossa  0.72              +-----------------+-------------+----------+--------+  Prox forearm     0.43              +-----------------+-------------+----------+--------+  Mid forearm     0.57              +-----------------+-------------+----------+--------+  Dist forearm     0.41              +-----------------+-------------+----------+--------+   +-----------------+-------------+----------+--------+  Right Basilic  Diameter (cm)Depth (cm)Findings  +-----------------+-------------+----------+--------+  Prox upper arm    0.47              +-----------------+-------------+----------+--------+  Mid upper arm    0.42               +-----------------+-------------+----------+--------+  Dist upper arm    0.42              +-----------------+-------------+----------+--------+  Antecubital fossa  0.38              +-----------------+-------------+----------+--------+  Prox forearm     0.39              +-----------------+-------------+----------+--------+   +-----------------+-------------+----------+--------+  Left Cephalic  Diameter (cm)Depth (cm)Findings  +-----------------+-------------+----------+--------+  Shoulder       0.41              +-----------------+-------------+----------+--------+  Prox upper arm    0.36              +-----------------+-------------+----------+--------+  Mid upper arm    0.38              +-----------------+-------------+----------+--------+  Dist upper arm    0.43              +-----------------+-------------+----------+--------+  Antecubital fossa  0.45              +-----------------+-------------+----------+--------+  Prox forearm     0.50              +-----------------+-------------+----------+--------+  Mid forearm     0.35              +-----------------+-------------+----------+--------+  Dist forearm     0.39              +-----------------+-------------+----------+--------+   +-----------------+-------------+----------+--------+  Left Basilic   Diameter (cm)Depth (cm)Findings  +-----------------+-------------+----------+--------+  Prox upper arm    0.37              +-----------------+-------------+----------+--------+  Mid upper arm    0.37              +-----------------+-------------+----------+--------+  Dist upper arm  0.37               +-----------------+-------------+----------+--------+  Antecubital fossa  0.34              +-----------------+-------------+----------+--------+  Prox forearm     0.40              +-----------------+-------------+----------+--------+   Summary: Right: Patent cephalic and basilic veins with diameters as  described     above.  Left: Patent cephalic and basilic veins with diameters as described    above.      Assessment/Plan:     40 year old male he is left-hand dominant now in need of permanent access.  Plan will be for right upper extremity AV fistula versus graft.  He does appear to have suitable veins on the right by duplex also very easily palpable pulses.  We will get this scheduled in the near future.  I discussed the risk benefits alternatives including the risk of primary nonfunction, need for further procedures, steal, wound healing issues.    Waynetta Sandy MD [facility]

## 2020-12-07 NOTE — H&P (View-Only) (Signed)
Patient ID: Jeremy [name], male   DOB: [birth year], 40 y.o.   MRN: [id number]  Reason for Consult: Follow-up   Referred by Jeremy Mulligan, MD  Subjective:     HPI:  Jeremy [name] is a 40 y.o. male previous history of temporary dialysis via right chest catheter.  Has not needed dialysis since then.  We previously planned for dialysis access but patient has deferred.  Denies any history of chest, breast or upper extremity surgery.  He is left-hand dominant.  Currently no swelling but is experiencing some shortness of breath.  He does not take any blood thinners.  Past Medical History:  Diagnosis Date  . Abscess of left groin   . Acute blood loss anemia 11/11/2013  . Anemia in chronic kidney disease (CKD)   . Asthma   . Boil of scrotum 11/21/2015  . Chest pain    a. 01/2015 Lexiscan MV: EF 28%, inferior, inferolateral, apical ischemia;  b. 01/2015 Cath: nl cors, PCWP 18 mmHg, CO 9.38 L/min, CI 3.53 L/min/m^2.  . Depression    Situational  . Essential hypertension   . GERD (gastroesophageal reflux disease)   . Hyperlipidemia   . Membranous glomerulonephritis   . Morbid obesity ([place])   . Nonischemic cardiomyopathy ([place])    a. 01/2015 Echo: EF 20-25%, diff HK, Gr 2 DD, Triv AI, mildly dil LA and Ao root.  . Seizures ([place]) 02/29/2020  . Type II diabetes mellitus ([place])    a. 01/2015 HbA1c = 8.9.   Family History  Problem Relation Age of Onset  . Diabetes Mellitus II Mother        died @ 15.  . Gastric cancer Mother   . CAD Father        died @ 39.  [name] Heart attack Father   . Congestive Heart Failure Father   . Diabetes Mellitus II Sister   . CAD Sister        s/p PCI - age 45.   Past Surgical History:  Procedure Laterality Date  . CARDIAC CATHETERIZATION N/A 02/01/2015   Procedure: Right/Left Heart Cath and Coronary Angiography;  Surgeon: Josue Hector, MD;  Location: [facility];  Service: Cardiovascular;  Laterality: N/A;  . IR FLUORO GUIDE CV LINE RIGHT  10/11/2019   . IR US GUIDE VASC ACCESS RIGHT  10/11/2019  . THORACOTOMY Left 11/08/2013   Procedure: LEFT THORACOTOMY;  Surgeon: Ivin Poot, MD;  Location: [facility];  Service: Thoracic;  Laterality: Left;    Short Social History:  Social History   Tobacco Use  . Smoking status: Never Smoker  . Smokeless tobacco: Never Used  Substance Use Topics  . Alcohol use: No    Alcohol/week: 0.0 standard drinks    Allergies  Allergen Reactions  . Acyclovir And Related Other (See Comments)    Unknown reaction    Current Outpatient Medications  Medication Sig Dispense Refill  . Accu-Chek FastClix Lancets MISC Check your blood sugars 4 times a day. 402 each 1  . ACCU-CHEK GUIDE test strip USE TO CHECK BLOOD SUGAR 4 TIMES DAILY 375 strip 1  . acetaminophen (TYLENOL) 500 MG tablet Take 1,000 mg by mouth every 6 (six) hours as needed for fever or headache (pain).    [name] amLODipine (NORVASC) 10 MG tablet Take 1 tablet (10 mg total) by mouth daily. 90 tablet 3  . atorvastatin (LIPITOR) 80 MG tablet Take 1 tablet (80 mg total) by mouth daily. 90 tablet 3  . blood  glucose meter kit and supplies KIT Dispense based on patient and insurance preference. Use up to four times daily as directed. (FOR ICD-9 250.00, 250.01). 1 each 0  . carvedilol (COREG) 25 MG tablet Take 1 tablet (25 mg total) by mouth 2 (two) times daily with a meal. Needs appt for further refills 60 tablet 2  . cloNIDine (CATAPRES) 0.3 MG tablet TAKE 1 TABLET (0.3 MG TOTAL) BY MOUTH TWICE A DAY 60 tablet 6  . famotidine (PEPCID) 10 MG tablet Take 1 tablet (10 mg total) by mouth daily as needed for heartburn or indigestion. (Patient taking differently: Take 10 mg by mouth daily.) 90 tablet 3  . glipiZIDE (GLUCOTROL) 5 MG tablet Take 1 tablet (5 mg total) by mouth daily. 90 tablet 3  . hydrALAZINE (APRESOLINE) 100 MG tablet Take 1 tablet (100 mg total) by mouth 3 (three) times daily. Needs appointment for further refill. 180 tablet 0  . insulin aspart  (NOVOLOG) 100 UNIT/ML injection Inject 17 Units into the skin 3 (three) times daily with meals. 10 mL 3  . insulin glargine (LANTUS) 100 UNIT/ML injection Inject 37 units at bedtime 10 mL 3  . Insulin Syringe-Needle U-100 (ULTICARE INSULIN SYRINGE) 30G X 1/2" 0.5 ML MISC USE 4 TIMES DAILY WITH INSULIN INJECTIONS. 100 each 3  . nitroGLYCERIN (NITROSTAT) 0.4 MG SL tablet PLACE 1 TABLET UNDER THE TONGUE EVERY 5 MINUTES AS NEEDED FOR CHEST PAIN. (Patient taking differently: Place 0.4 mg under the tongue every 5 (five) minutes as needed for chest pain.) 25 tablet 3  . UNIFINE PENTIPS 32G X 4 MM MISC USE ONCE DAILY WITH VICTOZA 100 each 1  . Vitamin D, Ergocalciferol, (DRISDOL) 1.25 MG (50000 UNIT) CAPS capsule Take 50,000 Units by mouth See admin instructions. Take one capsule (50000 units) by mouth twice weekly - on Wednesday and Sunday     No current facility-administered medications for this visit.   Facility-Administered Medications Ordered in Other Visits  Medication Dose Route Frequency Provider Last Rate Last Admin  . ceFAZolin (ANCEF) 3 g in dextrose 5 % 50 mL IVPB  3 g Intravenous 30 min Pre-Op Kandice Schmelter Christopher, MD        Review of Systems  Constitutional:  Constitutional negative. HENT: HENT negative.  Eyes: Eyes negative.  Respiratory: Positive for shortness of breath.  GI: Gastrointestinal negative.  Musculoskeletal: Musculoskeletal negative.  Skin: Skin negative.  Neurological: Neurological negative. Hematologic: Hematologic/lymphatic negative.  Psychiatric: Psychiatric negative.        Objective:  Objective   Vitals:   12/07/20 1539  BP: (!) 149/93  Pulse: 74  Resp: 20  Temp: 98.3 F (36.8 C)  SpO2: 97%  Weight: 299 lb 11.2 oz (135.9 kg)  Height: 6' 5" (1.956 m)   Body mass index is 35.54 kg/m.  Physical Exam HENT:     Head: Normocephalic.     Nose:     Comments: Wearing a mask Eyes:     Pupils: Pupils are equal, round, and reactive to light.   Cardiovascular:     Rate and Rhythm: Normal rate.     Pulses: Normal pulses.  Pulmonary:     Effort: Pulmonary effort is normal.  Abdominal:     Palpations: Abdomen is soft.  Skin:    General: Skin is warm.     Capillary Refill: Capillary refill takes less than 2 seconds.  Neurological:     General: No focal deficit present.     Mental Status: He is alert.    Psychiatric:        Mood and Affect: Mood normal.        Behavior: Behavior normal.        Thought Content: Thought content normal.        Judgment: Judgment normal.     Data: Right Cephalic  Diameter (cm)Depth (cm)Findings  +-----------------+-------------+----------+--------+  Shoulder       0.61              +-----------------+-------------+----------+--------+  Prox upper arm    0.62              +-----------------+-------------+----------+--------+  Mid upper arm    0.57              +-----------------+-------------+----------+--------+  Dist upper arm    0.55              +-----------------+-------------+----------+--------+  Antecubital fossa  0.72              +-----------------+-------------+----------+--------+  Prox forearm     0.43              +-----------------+-------------+----------+--------+  Mid forearm     0.57              +-----------------+-------------+----------+--------+  Dist forearm     0.41              +-----------------+-------------+----------+--------+   +-----------------+-------------+----------+--------+  Right Basilic  Diameter (cm)Depth (cm)Findings  +-----------------+-------------+----------+--------+  Prox upper arm    0.47              +-----------------+-------------+----------+--------+  Mid upper arm    0.42               +-----------------+-------------+----------+--------+  Dist upper arm    0.42              +-----------------+-------------+----------+--------+  Antecubital fossa  0.38              +-----------------+-------------+----------+--------+  Prox forearm     0.39              +-----------------+-------------+----------+--------+   +-----------------+-------------+----------+--------+  Left Cephalic  Diameter (cm)Depth (cm)Findings  +-----------------+-------------+----------+--------+  Shoulder       0.41              +-----------------+-------------+----------+--------+  Prox upper arm    0.36              +-----------------+-------------+----------+--------+  Mid upper arm    0.38              +-----------------+-------------+----------+--------+  Dist upper arm    0.43              +-----------------+-------------+----------+--------+  Antecubital fossa  0.45              +-----------------+-------------+----------+--------+  Prox forearm     0.50              +-----------------+-------------+----------+--------+  Mid forearm     0.35              +-----------------+-------------+----------+--------+  Dist forearm     0.39              +-----------------+-------------+----------+--------+   +-----------------+-------------+----------+--------+  Left Basilic   Diameter (cm)Depth (cm)Findings  +-----------------+-------------+----------+--------+  Prox upper arm    0.37              +-----------------+-------------+----------+--------+  Mid upper arm    0.37              +-----------------+-------------+----------+--------+  Dist upper arm  0.37               +-----------------+-------------+----------+--------+  Antecubital fossa  0.34              +-----------------+-------------+----------+--------+  Prox forearm     0.40              +-----------------+-------------+----------+--------+   Summary: Right: Patent cephalic and basilic veins with diameters as  described     above.  Left: Patent cephalic and basilic veins with diameters as described    above.      Assessment/Plan:     40 year old male he is left-hand dominant now in need of permanent access.  Plan will be for right upper extremity AV fistula versus graft.  He does appear to have suitable veins on the right by duplex also very easily palpable pulses.  We will get this scheduled in the near future.  I discussed the risk benefits alternatives including the risk of primary nonfunction, need for further procedures, steal, wound healing issues.    Waynetta Sandy MD [facility]

## 2020-12-07 NOTE — Telephone Encounter (Signed)
Realized as patient left - his insulin instructions for surgery were not in his letter. We had discussed it during scheduling - but forgot to include it. Called patient and confirmed halving the insulin the night before. Says he does not take it in the morning. Patient verbalized understanding.

## 2020-12-10 ENCOUNTER — Other Ambulatory Visit (HOSPITAL_COMMUNITY): Payer: Self-pay

## 2020-12-10 MED FILL — Insulin Glargine Inj 100 Unit/ML: SUBCUTANEOUS | 27 days supply | Qty: 10 | Fill #0 | Status: AC

## 2020-12-10 MED FILL — Carvedilol Tab 25 MG: ORAL | 30 days supply | Qty: 60 | Fill #0 | Status: AC

## 2020-12-12 ENCOUNTER — Other Ambulatory Visit (HOSPITAL_COMMUNITY): Payer: Self-pay

## 2020-12-12 MED FILL — Insulin Aspart Inj 100 Unit/ML: SUBCUTANEOUS | 19 days supply | Qty: 10 | Fill #0 | Status: AC

## 2020-12-16 ENCOUNTER — Other Ambulatory Visit: Payer: Self-pay

## 2020-12-16 ENCOUNTER — Encounter (HOSPITAL_COMMUNITY): Payer: Self-pay | Admitting: Vascular Surgery

## 2020-12-16 NOTE — Progress Notes (Signed)
Same Day Work-Up Phone Call:  PCP - Cato Mulligan, MD Cardiologist - Loralie Champagne, MD  PPM/ICD - Denies   Chest x-ray - 02/29/20 EKG - 02/29/20 Stress Test - 01/25/15 ECHO - 06/28/19 Cardiac Cath - 02/01/15  Sleep Study - Yes CPAP - Denies  Fasting Blood Sugar - 200s-300s Checks Blood Sugar 2 times a day.  Last A1C 7.2 10/15/20  Blood Thinner Instructions: N/A Aspirin Instructions: N/A  ERAS Protcol - N/A PRE-SURGERY Ensure or G2- N/A  COVID TEST- 12/17/20   Anesthesia review: Yes, hx. cardiomyopathy  -----------------------------------------------------------------------  Patient Instructions:   Your procedure is scheduled on Tuesday 12/18/20.  Report to [facility] Main Entrance "A" at 05:30 A.M., and check in at the Admitting office.  Call this number if you have problems the morning of surgery:  [phone]    Remember:  Do not eat or drink after midnight the night before your surgery.    Take these medicines the morning of surgery with A SIP OF WATER: amLODipine (NORVASC) atorvastatin (LIPITOR) carvedilol (COREG) cloNIDine (CATAPRES)   As of today, STOP taking any Aspirin (unless otherwise instructed by your surgeon) Aleve, Naproxen, Ibuprofen, Motrin, Advil, Goody's, BC's, all herbal medications, fish oil, and all vitamins.   WHAT DO I DO ABOUT MY DIABETES MEDICATION?  [name] Do not take oral diabetes medicines (pills)- glipiZIDE (GLUCOTROL)  the morning of surgery.  . THE NIGHT BEFORE SURGERY, take  18 units of insulin glargine (LANTUS) insulin.      If your CBG is greater than 220 mg/dL, you may take  of your sliding scale (correction) dose of insulin aspart (NOVOLOG).  HOW TO MANAGE YOUR DIABETES BEFORE AND AFTER SURGERY  Why is it important to control my blood sugar before and after surgery? . Improving blood sugar levels before and after surgery helps healing and can limit problems. . A way of improving blood sugar control is eating a  healthy diet by: o  Eating less sugar and carbohydrates o  Increasing activity/exercise o  Talking with your doctor about reaching your blood sugar goals . High blood sugars (greater than 180 mg/dL) can raise your risk of infections and slow your recovery, so you will need to focus on controlling your diabetes during the weeks before surgery. . Make sure that the doctor who takes care of your diabetes knows about your planned surgery including the date and location.  How do I manage my blood sugar before surgery? . Check your blood sugar at least 4 times a day, starting 2 days before surgery, to make sure that the level is not too high or low. . Check your blood sugar the morning of your surgery when you wake up and every 2 hours until you get to the Short Stay unit. o If your blood sugar is less than 70 mg/dL, you will need to treat for low blood sugar: - Do not take insulin. - Treat a low blood sugar (less than 70 mg/dL) with  cup of clear juice (cranberry or apple), 4 glucose tablets, OR glucose gel. - Recheck blood sugar in 15 minutes after treatment (to make sure it is greater than 70 mg/dL). If your blood sugar is not greater than 70 mg/dL on recheck, call [phone] for further instructions.    Day of Surgery: Shower Wear Clean/Comfortable clothing the morning of surgery Do not apply any deodorants/lotions.               Do not wear jewelry. Remember to brush your  teeth WITH YOUR REGULAR TOOTHPASTE.             Do not shave 48 hours prior to surgery.  Men may shave face and neck.            Do not bring valuables to the hospital.            [facility] is not responsible for any belongings or valuables.    Do NOT Smoke (Tobacco/Vaping) or drink Alcohol 24 hours prior to your procedure.  If you use a CPAP at night, you may bring all equipment for your overnight stay.   Contacts, glasses, dentures or bridgework may not be worn into surgery.      Patients discharged the day of  surgery will not be allowed to drive home, and someone needs to stay with them for 24 hours.

## 2020-12-17 ENCOUNTER — Other Ambulatory Visit (HOSPITAL_COMMUNITY)
Admission: RE | Admit: 2020-12-17 | Discharge: 2020-12-17 | Disposition: A | Discharge status: Home / Self Care | Payer: Medicaid Other | Source: Ambulatory Visit | Attending: Vascular Surgery | Admitting: Vascular Surgery

## 2020-12-17 DIAGNOSIS — Z20822 Contact with and (suspected) exposure to covid-19: Secondary | ICD-10-CM | POA: Insufficient documentation

## 2020-12-17 DIAGNOSIS — Z01812 Encounter for preprocedural laboratory examination: Secondary | ICD-10-CM | POA: Insufficient documentation

## 2020-12-17 MED ORDER — DEXTROSE 5 % IV SOLN
3.0000 g | INTRAVENOUS | Status: AC
  Administered 2020-12-18: 3 g via INTRAVENOUS
  Filled 2020-12-17: qty 3
  Filled 2020-12-17: qty 3000

## 2020-12-18 ENCOUNTER — Other Ambulatory Visit: Payer: Self-pay

## 2020-12-18 ENCOUNTER — Encounter (HOSPITAL_COMMUNITY): Payer: Self-pay | Admitting: Vascular Surgery

## 2020-12-18 ENCOUNTER — Ambulatory Visit (HOSPITAL_COMMUNITY)
Admission: RE | Admit: 2020-12-18 | Discharge: 2020-12-18 | Disposition: A | Discharge status: Home / Self Care | Payer: Medicaid Other | Attending: Vascular Surgery | Admitting: Vascular Surgery

## 2020-12-18 ENCOUNTER — Ambulatory Visit (HOSPITAL_COMMUNITY): Payer: Medicaid Other | Admitting: Certified Registered"

## 2020-12-18 ENCOUNTER — Encounter (HOSPITAL_COMMUNITY)
Admission: RE | Disposition: A | Discharge status: Home / Self Care | Payer: Self-pay | Source: Home / Self Care | Attending: Vascular Surgery

## 2020-12-18 ENCOUNTER — Other Ambulatory Visit (HOSPITAL_COMMUNITY): Payer: Self-pay

## 2020-12-18 DIAGNOSIS — Z833 Family history of diabetes mellitus: Secondary | ICD-10-CM | POA: Diagnosis not present

## 2020-12-18 DIAGNOSIS — I129 Hypertensive chronic kidney disease with stage 1 through stage 4 chronic kidney disease, or unspecified chronic kidney disease: Secondary | ICD-10-CM | POA: Insufficient documentation

## 2020-12-18 DIAGNOSIS — Z79899 Other long term (current) drug therapy: Secondary | ICD-10-CM | POA: Insufficient documentation

## 2020-12-18 DIAGNOSIS — Z888 Allergy status to other drugs, medicaments and biological substances status: Secondary | ICD-10-CM | POA: Insufficient documentation

## 2020-12-18 DIAGNOSIS — Z7984 Long term (current) use of oral hypoglycemic drugs: Secondary | ICD-10-CM | POA: Diagnosis not present

## 2020-12-18 DIAGNOSIS — E1122 Type 2 diabetes mellitus with diabetic chronic kidney disease: Secondary | ICD-10-CM | POA: Diagnosis not present

## 2020-12-18 DIAGNOSIS — N179 Acute kidney failure, unspecified: Secondary | ICD-10-CM | POA: Diagnosis not present

## 2020-12-18 DIAGNOSIS — Z8249 Family history of ischemic heart disease and other diseases of the circulatory system: Secondary | ICD-10-CM | POA: Diagnosis not present

## 2020-12-18 DIAGNOSIS — N185 Chronic kidney disease, stage 5: Secondary | ICD-10-CM

## 2020-12-18 DIAGNOSIS — R569 Unspecified convulsions: Secondary | ICD-10-CM | POA: Insufficient documentation

## 2020-12-18 DIAGNOSIS — I428 Other cardiomyopathies: Secondary | ICD-10-CM | POA: Diagnosis not present

## 2020-12-18 DIAGNOSIS — Z794 Long term (current) use of insulin: Secondary | ICD-10-CM | POA: Diagnosis not present

## 2020-12-18 DIAGNOSIS — N189 Chronic kidney disease, unspecified: Secondary | ICD-10-CM | POA: Diagnosis not present

## 2020-12-18 DIAGNOSIS — I5042 Chronic combined systolic (congestive) and diastolic (congestive) heart failure: Secondary | ICD-10-CM | POA: Diagnosis not present

## 2020-12-18 DIAGNOSIS — E785 Hyperlipidemia, unspecified: Secondary | ICD-10-CM | POA: Diagnosis not present

## 2020-12-18 DIAGNOSIS — N186 End stage renal disease: Secondary | ICD-10-CM | POA: Diagnosis not present

## 2020-12-18 DIAGNOSIS — Z992 Dependence on renal dialysis: Secondary | ICD-10-CM | POA: Diagnosis not present

## 2020-12-18 DIAGNOSIS — Z6835 Body mass index (BMI) 35.0-35.9, adult: Secondary | ICD-10-CM | POA: Insufficient documentation

## 2020-12-18 DIAGNOSIS — I132 Hypertensive heart and chronic kidney disease with heart failure and with stage 5 chronic kidney disease, or end stage renal disease: Secondary | ICD-10-CM | POA: Diagnosis not present

## 2020-12-18 HISTORY — DX: Heart failure, unspecified: I50.9

## 2020-12-18 HISTORY — PX: AV FISTULA PLACEMENT: SHX1204

## 2020-12-18 HISTORY — DX: Family history of other specified conditions: Z84.89

## 2020-12-18 LAB — POCT I-STAT, CHEM 8
BUN: 64 mg/dL — ABNORMAL HIGH (ref 6–20)
Calcium, Ion: 1.12 mmol/L — ABNORMAL LOW (ref 1.15–1.40)
Chloride: 112 mmol/L — ABNORMAL HIGH (ref 98–111)
Creatinine, Ser: 9 mg/dL — ABNORMAL HIGH (ref 0.61–1.24)
Glucose, Bld: 170 mg/dL — ABNORMAL HIGH (ref 70–99)
HCT: 32 % — ABNORMAL LOW (ref 39.0–52.0)
Hemoglobin: 10.9 g/dL — ABNORMAL LOW (ref 13.0–17.0)
Potassium: 4.2 mmol/L (ref 3.5–5.1)
Sodium: 144 mmol/L (ref 135–145)
TCO2: 21 mmol/L — ABNORMAL LOW (ref 22–32)

## 2020-12-18 LAB — SARS CORONAVIRUS 2 (TAT 6-24 HRS): SARS Coronavirus 2: NEGATIVE

## 2020-12-18 LAB — GLUCOSE, CAPILLARY
Glucose-Capillary: 166 mg/dL — ABNORMAL HIGH (ref 70–99)
Glucose-Capillary: 190 mg/dL — ABNORMAL HIGH (ref 70–99)

## 2020-12-18 SURGERY — ARTERIOVENOUS (AV) FISTULA CREATION
Anesthesia: Monitor Anesthesia Care | Laterality: Right

## 2020-12-18 MED ORDER — SODIUM CHLORIDE 0.9 % IV SOLN: INTRAVENOUS | Status: DC | PRN

## 2020-12-18 MED ORDER — OXYCODONE-ACETAMINOPHEN 5-325 MG PO TABS
1.0000 | ORAL_TABLET | Freq: Four times a day (QID) | ORAL | 0 refills | Status: DC | PRN
  Filled 2020-12-18: qty 8, 2d supply, fill #0

## 2020-12-18 MED ORDER — MIDAZOLAM HCL 2 MG/2ML IJ SOLN
INTRAMUSCULAR | Status: AC
  Administered 2020-12-18: 2 mg via INTRAVENOUS
  Filled 2020-12-18: qty 2

## 2020-12-18 MED ORDER — MIDAZOLAM HCL 2 MG/2ML IJ SOLN
INTRAMUSCULAR | Status: AC
  Filled 2020-12-18: qty 2

## 2020-12-18 MED ORDER — SODIUM CHLORIDE 0.9 % IV SOLN
INTRAVENOUS | Status: DC
  Administered 2020-12-18: 10 mL/h via INTRAVENOUS

## 2020-12-18 MED ORDER — LIDOCAINE HCL (CARDIAC) PF 100 MG/5ML IV SOSY
PREFILLED_SYRINGE | INTRAVENOUS | Status: DC | PRN
  Administered 2020-12-18: 50 mg via INTRATRACHEAL

## 2020-12-18 MED ORDER — SODIUM CHLORIDE 0.9 % IV SOLN
INTRAVENOUS | Status: DC | PRN
  Administered 2020-12-18: 500 mL

## 2020-12-18 MED ORDER — ROPIVACAINE HCL 5 MG/ML IJ SOLN
INTRAMUSCULAR | Status: DC | PRN
  Administered 2020-12-18: 10 mL via PERINEURAL

## 2020-12-18 MED ORDER — FENTANYL CITRATE (PF) 100 MCG/2ML IJ SOLN: 100.0000 ug | INTRAMUSCULAR | Status: AC

## 2020-12-18 MED ORDER — MIDAZOLAM HCL 2 MG/2ML IJ SOLN
INTRAMUSCULAR | Status: DC | PRN
  Administered 2020-12-18: 2 mg via INTRAVENOUS

## 2020-12-18 MED ORDER — GLYCOPYRROLATE 0.2 MG/ML IJ SOLN
INTRAMUSCULAR | Status: DC | PRN
  Administered 2020-12-18: .2 mg via INTRAVENOUS

## 2020-12-18 MED ORDER — CHLORHEXIDINE GLUCONATE 0.12 % MT SOLN: 15.0000 mL | Freq: Once | OROMUCOSAL | Status: AC

## 2020-12-18 MED ORDER — SODIUM CHLORIDE 0.9 % IV SOLN
INTRAVENOUS | Status: AC
  Filled 2020-12-18: qty 1.2

## 2020-12-18 MED ORDER — CHLORHEXIDINE GLUCONATE 0.12 % MT SOLN
OROMUCOSAL | Status: AC
  Administered 2020-12-18: 15 mL via OROMUCOSAL
  Filled 2020-12-18: qty 15

## 2020-12-18 MED ORDER — MIDAZOLAM HCL 2 MG/2ML IJ SOLN: 2.0000 mg | INTRAMUSCULAR | Status: AC

## 2020-12-18 MED ORDER — 0.9 % SODIUM CHLORIDE (POUR BTL) OPTIME
TOPICAL | Status: DC | PRN
  Administered 2020-12-18: 1000 mL

## 2020-12-18 MED ORDER — SUFENTANIL CITRATE 50 MCG/ML IV SOLN
INTRAVENOUS | Status: AC
  Filled 2020-12-18: qty 1

## 2020-12-18 MED ORDER — FENTANYL CITRATE (PF) 100 MCG/2ML IJ SOLN
INTRAMUSCULAR | Status: AC
  Administered 2020-12-18: 100 ug via INTRAVENOUS
  Filled 2020-12-18: qty 2

## 2020-12-18 MED ORDER — LIDOCAINE-EPINEPHRINE (PF) 1.5 %-1:200000 IJ SOLN
INTRAMUSCULAR | Status: DC | PRN
  Administered 2020-12-18: 20 mL via PERINEURAL

## 2020-12-18 MED ORDER — PROPOFOL 10 MG/ML IV BOLUS
INTRAVENOUS | Status: AC
  Filled 2020-12-18: qty 20

## 2020-12-18 MED ORDER — PROPOFOL 500 MG/50ML IV EMUL
INTRAVENOUS | Status: DC | PRN
  Administered 2020-12-18: 100 ug/kg/min via INTRAVENOUS

## 2020-12-18 MED ORDER — LABETALOL HCL 5 MG/ML IV SOLN
INTRAVENOUS | Status: DC | PRN
  Administered 2020-12-18: 5 mg via INTRAVENOUS
  Administered 2020-12-18: 10 mg via INTRAVENOUS
  Administered 2020-12-18: 5 mg via INTRAVENOUS

## 2020-12-18 MED ORDER — CHLORHEXIDINE GLUCONATE 4 % EX LIQD: 60.0000 mL | Freq: Once | CUTANEOUS | Status: DC

## 2020-12-18 SURGICAL SUPPLY — 30 items
ADH SKN CLS APL DERMABOND .7 (GAUZE/BANDAGES/DRESSINGS) ×1
ARMBAND PINK RESTRICT EXTREMIT (MISCELLANEOUS) ×2 IMPLANT
CANISTER SUCT 3000ML PPV (MISCELLANEOUS) ×2 IMPLANT
CLIP VESOCCLUDE MED 6/CT (CLIP) ×2 IMPLANT
CLIP VESOCCLUDE SM WIDE 6/CT (CLIP) ×2 IMPLANT
COVER PROBE W GEL 5X96 (DRAPES) ×1 IMPLANT
COVER WAND RF STERILE (DRAPES) ×1 IMPLANT
DERMABOND ADVANCED (GAUZE/BANDAGES/DRESSINGS) ×1
DERMABOND ADVANCED .7 DNX12 (GAUZE/BANDAGES/DRESSINGS) ×1 IMPLANT
ELECT REM PT RETURN 9FT ADLT (ELECTROSURGICAL) ×2
ELECTRODE REM PT RTRN 9FT ADLT (ELECTROSURGICAL) ×1 IMPLANT
GLOVE BIO SURGEON STRL SZ7.5 (GLOVE) ×2 IMPLANT
GOWN STRL REUS W/ TWL LRG LVL3 (GOWN DISPOSABLE) ×2 IMPLANT
GOWN STRL REUS W/ TWL XL LVL3 (GOWN DISPOSABLE) ×1 IMPLANT
GOWN STRL REUS W/TWL LRG LVL3 (GOWN DISPOSABLE) ×4
GOWN STRL REUS W/TWL XL LVL3 (GOWN DISPOSABLE) ×2
INSERT FOGARTY SM (MISCELLANEOUS) IMPLANT
KIT BASIN OR (CUSTOM PROCEDURE TRAY) ×2 IMPLANT
KIT TURNOVER KIT B (KITS) ×2 IMPLANT
NS IRRIG 1000ML POUR BTL (IV SOLUTION) ×2 IMPLANT
PACK CV ACCESS (CUSTOM PROCEDURE TRAY) ×2 IMPLANT
PAD ARMBOARD 7.5X6 YLW CONV (MISCELLANEOUS) ×4 IMPLANT
SLING ARM FOAM STRAP XLG (SOFTGOODS) ×1 IMPLANT
SUT MNCRL AB 4-0 PS2 18 (SUTURE) ×2 IMPLANT
SUT PROLENE 6 0 BV (SUTURE) ×2 IMPLANT
SUT VIC AB 3-0 SH 27 (SUTURE) ×2
SUT VIC AB 3-0 SH 27X BRD (SUTURE) ×1 IMPLANT
TOWEL GREEN STERILE (TOWEL DISPOSABLE) ×2 IMPLANT
UNDERPAD 30X36 HEAVY ABSORB (UNDERPADS AND DIAPERS) ×2 IMPLANT
WATER STERILE IRR 1000ML POUR (IV SOLUTION) ×2 IMPLANT

## 2020-12-18 NOTE — Anesthesia Procedure Notes (Signed)
Anesthesia Procedure Image    

## 2020-12-18 NOTE — Anesthesia Preprocedure Evaluation (Signed)
Anesthesia Evaluation  Patient identified by MRN, date of birth, ID band Patient awake    Reviewed: Allergy & Precautions, NPO status , Patient's Chart, lab work & pertinent test results  Airway Mallampati: II  TM Distance: >3 FB Neck ROM: Full    Dental no notable dental hx.    Pulmonary neg pulmonary ROS,    Pulmonary exam normal breath sounds clear to auscultation       Cardiovascular hypertension, +CHF  Normal cardiovascular exam Rhythm:Regular Rate:Normal  EF 25%   Neuro/Psych negative neurological ROS  negative psych ROS   GI/Hepatic Neg liver ROS, GERD  ,  Endo/Other  diabetes  Renal/GU DialysisRenal disease  negative genitourinary   Musculoskeletal negative musculoskeletal ROS (+)   Abdominal   Peds negative pediatric ROS (+)  Hematology negative hematology ROS (+)   Anesthesia Other Findings   Reproductive/Obstetrics negative OB ROS                             Anesthesia Physical Anesthesia Plan  ASA: III  Anesthesia Plan: MAC   Post-op Pain Management:    Induction: Intravenous  PONV Risk Score and Plan: 1 and Propofol infusion and Treatment may vary due to age or medical condition  Airway Management Planned:   Additional Equipment:   Intra-op Plan:   Post-operative Plan: Extubation in OR  Informed Consent: I have reviewed the patients History and Physical, chart, labs and discussed the procedure including the risks, benefits and alternatives for the proposed anesthesia with the patient or authorized representative who has indicated his/her understanding and acceptance.     Dental advisory given  Plan Discussed with: CRNA and Surgeon  Anesthesia Plan Comments:         Anesthesia Quick Evaluation

## 2020-12-18 NOTE — Anesthesia Postprocedure Evaluation (Signed)
Anesthesia Post Note  Patient: Jeremy [name]  Procedure(s) Performed: RIGHT ARM RADIOCEPHALIC  ARTERIOVENOUS (AV) FISTULA CREATION (Right )     Patient location during evaluation: PACU Anesthesia Type: MAC and Regional Level of consciousness: awake and alert Pain management: pain level controlled Vital Signs Assessment: post-procedure vital signs reviewed and stable Respiratory status: spontaneous breathing, nonlabored ventilation, respiratory function stable and patient connected to nasal cannula oxygen Cardiovascular status: stable and blood pressure returned to baseline Postop Assessment: no apparent nausea or vomiting Anesthetic complications: no   No complications documented.  Last Vitals:  Vitals:   12/18/20 0917 12/18/20 0930  BP: (!) 142/90 (!) 158/99  Pulse: 91 83  Resp: (!) 23 20  Temp:  36.9 C  SpO2: 95% 98%    Last Pain:  Vitals:   12/18/20 0930  PainSc: 0-No pain                 [name] S

## 2020-12-18 NOTE — Interval H&P Note (Signed)
History and Physical Interval Note:  12/18/2020 7:14 AM  Jeremy [name]  has presented today for surgery, with the diagnosis of ESRD.  The various methods of treatment have been discussed with the patient and family. After consideration of risks, benefits and other options for treatment, the patient has consented to  Procedure(s): RIGHT ARM ARTERIOVENOUS (AV) FISTULA CREATION VERSUS GRAFT PLACEMENT (Right) as a surgical intervention.  The patient's history has been reviewed, patient examined, no change in status, stable for surgery.  I have reviewed the patient's chart and labs.  Questions were answered to the patient's satisfaction.     Servando Snare

## 2020-12-18 NOTE — Progress Notes (Signed)
Pt's BP 200s/100s. Pt took his BP meds this AM (Coreg, Clonidine, and hydralazine). Denies pain, SHOB, Dizziness. Dr. Kalman Shan notified. No new orders at this time.

## 2020-12-18 NOTE — Transfer of Care (Signed)
Immediate Anesthesia Transfer of Care Note  Patient: Jeremy [name]  Procedure(s) Performed: RIGHT ARM RADIOCEPHALIC  ARTERIOVENOUS (AV) FISTULA CREATION (Right )  Patient Location: PACU  Anesthesia Type:MAC combined with regional for post-op pain  Level of Consciousness: drowsy, patient cooperative and responds to stimulation  Airway & Oxygen Therapy: Patient Spontanous Breathing  Post-op Assessment: Report given to RN, Post -op Vital signs reviewed and stable and Patient moving all extremities X 4  Post vital signs: Reviewed and stable  Last Vitals:  Vitals Value Taken Time  BP 216/161 12/18/20 0901  Temp    Pulse 91 12/18/20 0902  Resp 20 12/18/20 0902  SpO2 100 % 12/18/20 0902  Vitals shown include unvalidated device data.  Last Pain:  Vitals:   12/18/20 0732  PainSc: 0-No pain      Patients Stated Pain Goal: 5 (52/77/82 4235)  Complications: No complications documented.

## 2020-12-18 NOTE — Op Note (Signed)
    Patient name: Jeremy [name] MRN: [id number] DOB: 27-Jun-1981 Sex: male  12/18/2020 Pre-operative Diagnosis: ckd Post-operative diagnosis:  Same Surgeon:  Erlene Quan C. Donzetta Matters, MD Assistant: Leontine Locket, PA Procedure Performed:  Right arm radial artery to cephalic vein av fistula creation  Indications: 40 year old male with chronic kidney disease.  He is left-hand dominant.  He is indicated for right upper extremity fistula versus graft.  An assistant was necessary to expedite the case  Findings: Cephalic vein at the wrist measured approximately 3-1/2 mm.  The radial artery was similar size.  At completion there was a strong thrill in the fistula.  There was a palpable ulnar artery pulse.  Flow in the radial artery distally did augment with compression of the fistula.   Procedure:  The patient was identified in the holding area and taken to the operating room where he was placed the proper table.  Preoperative block of been placed.  MAC anesthesia was induced.  He was sterilely prepped and draped in the right upper extremity in usual fashion, antibiotics were administered and a timeout was called.  Ultrasound was used to identify what appeared to be a suitable vein at the wrist as well as suitable artery which was the radial artery.  There was a palpable radial and ulnar artery pulses at the wrist.  The block was tested.  Transverse incision was made between the vein and the artery.  We dissected sharply down we identified the vein.  1 branch of the nerve was divided between clips with scissors.  The vein was marked for orientation.  We divided through the deep fascia we identified the radial artery placed a vessel loop around this.  The vein was then clamped and transected distally.  We spatulated flushed with heparinized saline it was suitable for fistula creation.  It was reclamped.  The artery was clamped distally proximally opened longitudinally.  The vein was then inset with 6-0 Prolene suture.   Prior completion without flushing in both directions.  Upon completion there was a very strong thrill in the fistula.  There is a palpable radial pulse and a palpable ulnar pulse.  Flow in the radial artery augments with compression of the fistula by Doppler.  There is very strong Doppler flow in the ulnar artery.  Satisfied with this we irrigated the wound.  We obtain hemostasis.  We closed in layers with interrupted 3-0 Vicryl followed by running Monocryl suture.  Dermabond placed to level skin.  He was awakened from anesthesia having tolerated procedure well that immediate complication.  Counts were correct at completion.   EBL: 20cc  Shakema Surita C. Donzetta Matters, MD [facility] Office: [phone] Pager: [phone]

## 2020-12-18 NOTE — Anesthesia Procedure Notes (Signed)
Procedure Name: MAC Date/Time: 12/18/2020 7:50 AM Performed by: Claris Che, CRNA Pre-anesthesia Checklist: Patient identified, Emergency Drugs available, Suction available, Patient being monitored and Timeout performed Patient Re-evaluated:Patient Re-evaluated prior to induction Oxygen Delivery Method: Simple face mask Ventilation: Oral airway inserted - appropriate to patient size Dental Injury: Teeth and Oropharynx as per pre-operative assessment

## 2020-12-18 NOTE — Anesthesia Procedure Notes (Signed)
Anesthesia Regional Block: Supraclavicular block   Pre-Anesthetic Checklist: ,, timeout performed, Correct Patient, Correct Site, Correct Laterality, Correct Procedure, Correct Position, site marked, Risks and benefits discussed,  Surgical consent,  Pre-op evaluation,  At surgeon's request and post-op pain management  Laterality: Right  Prep: chloraprep       Needles:  Injection technique: Single-shot  Needle Type: Echogenic Needle     Needle Length: 9cm      Additional Needles:   Procedures:,,,, ultrasound used (permanent image in chart),,,,  Narrative:  Start time: 12/18/2020 7:27 AM End time: 12/18/2020 7:35 AM Injection made incrementally with aspirations every 5 mL.  Performed by: Personally  Anesthesiologist: Myrtie Soman, MD  Additional Notes: Patient tolerated the procedure well without complications

## 2020-12-18 NOTE — Discharge Instructions (Signed)
   [facility]  Discharge Instructions  AV Fistula or Graft Surgery for Dialysis Access  Please refer to the following instructions for your post-procedure care. Your surgeon or physician assistant will discuss any changes with you.  Activity  You may drive the day following your surgery, if you are comfortable and no longer taking prescription pain medication. Resume full activity as the soreness in your incision resolves.  Bathing/Showering  You may shower after you go home. Keep your incision dry for 48 hours. Do not soak in a bathtub, hot tub, or swim until the incision heals completely. You may not shower if you have a hemodialysis catheter.  Incision Care  Clean your incision with mild soap and water after 48 hours. Pat the area dry with a clean towel. You do not need a bandage unless otherwise instructed. Do not apply any ointments or creams to your incision. You may have skin glue on your incision. Do not peel it off. It will come off on its own in about one week. Your arm may swell a bit after surgery. To reduce swelling use pillows to elevate your arm so it is above your heart. Your doctor will tell you if you need to lightly wrap your arm with an ACE bandage.  Diet  Resume your normal diet. There are not special food restrictions following this procedure. In order to heal from your surgery, it is CRITICAL to get adequate nutrition. Your body requires vitamins, minerals, and protein. Vegetables are the best source of vitamins and minerals. Vegetables also provide the perfect balance of protein. Processed food has little nutritional value, so try to avoid this.  Medications  Resume taking all of your medications. If your incision is causing pain, you may take over-the counter pain relievers such as acetaminophen (Tylenol). If you were prescribed a stronger pain medication, please be aware these medications can cause nausea and constipation. Prevent  nausea by taking the medication with a snack or meal. Avoid constipation by drinking plenty of fluids and eating foods with high amount of fiber, such as fruits, vegetables, and grains.  Do not take Tylenol if you are taking prescription pain medications.  Follow up Your surgeon may want to see you in the office following your access surgery. If so, this will be arranged at the time of your surgery.  Please call us immediately for any of the following conditions:  . Increased pain, redness, drainage (pus) from your incision site . Fever of 101 degrees or higher . Severe or worsening pain at your incision site . Hand pain or numbness. .  Reduce your risk of vascular disease:  . Stop smoking. If you would like help, call [facility] at 1-800-QUIT-NOW 902-749-4863) or [place] at 916-708-9521  . Manage your cholesterol . Maintain a desired weight . Control your diabetes . Keep your blood pressure down  Dialysis  It will take several weeks to several months for your new dialysis access to be ready for use. Your surgeon will determine when it is okay to use it. Your nephrologist will continue to direct your dialysis. You can continue to use your Permcath until your new access is ready for use.   12/18/2020 Jeremy [name] UA:[id number] 23-May-1981  Surgeon(s): Waynetta Sandy, MD  Procedure(s): Creation of right radial cephalic AV fistula  x Do not stick fistula for 12 weeks    If you have any questions, please call the office at [phone].

## 2020-12-19 ENCOUNTER — Encounter (HOSPITAL_COMMUNITY): Payer: Self-pay | Admitting: Vascular Surgery

## 2020-12-28 ENCOUNTER — Other Ambulatory Visit (HOSPITAL_COMMUNITY): Payer: Self-pay

## 2020-12-28 ENCOUNTER — Telehealth: Payer: Self-pay | Admitting: Student

## 2020-12-28 MED FILL — Insulin Syringe/Needle U-100 1/2 ML 30 x 1/2": 25 days supply | Qty: 100 | Fill #0 | Status: AC

## 2020-12-28 MED FILL — Sodium Bicarbonate Tab 650 MG: ORAL | 30 days supply | Qty: 90 | Fill #0 | Status: AC

## 2020-12-28 MED FILL — Ergocalciferol Cap 1.25 MG (50000 Unit): ORAL | 28 days supply | Qty: 8 | Fill #0 | Status: AC

## 2020-12-28 NOTE — Telephone Encounter (Signed)
Called Mr.Mineau today to get him rescheduled with the [facility] on the Managed Medicaid team. The person that answered said he was  Not available but he would be in later in the day. I will attempt to reach him this afternoon.

## 2020-12-31 ENCOUNTER — Telehealth: Payer: Self-pay | Admitting: Student

## 2020-12-31 NOTE — Telephone Encounter (Signed)
I left a message with a family member who took my name and number and said she would have him call me back. This is an attempt to get him rescheduled with the [facility] for a phone visit. This was my 2nd call.

## 2021-01-09 DIAGNOSIS — E1129 Type 2 diabetes mellitus with other diabetic kidney complication: Secondary | ICD-10-CM | POA: Diagnosis not present

## 2021-01-09 DIAGNOSIS — D631 Anemia in chronic kidney disease: Secondary | ICD-10-CM | POA: Diagnosis not present

## 2021-01-09 DIAGNOSIS — N189 Chronic kidney disease, unspecified: Secondary | ICD-10-CM | POA: Diagnosis not present

## 2021-01-09 DIAGNOSIS — N185 Chronic kidney disease, stage 5: Secondary | ICD-10-CM | POA: Diagnosis not present

## 2021-01-09 DIAGNOSIS — N2581 Secondary hyperparathyroidism of renal origin: Secondary | ICD-10-CM | POA: Diagnosis not present

## 2021-01-09 DIAGNOSIS — I120 Hypertensive chronic kidney disease with stage 5 chronic kidney disease or end stage renal disease: Secondary | ICD-10-CM | POA: Diagnosis not present

## 2021-01-09 IMAGING — CT CT HEAD W/O CM
4 series · 17 of 47 positions shown, 19 images · non-contrast
Comparison: None.

CLINICAL DATA: Encephalopathy.  Possible seizure.

EXAM:
CT HEAD WITHOUT CONTRAST
TECHNIQUE: Contiguous axial images were obtained from the base of the skull
through the vertex without intravenous contrast.

[Series 3: head without · axial · non-contrast · 0.48mm/px · z∈[-184,-54]mm · 7 of 36 slices shown, 9 images]
[im 5/36  brain]
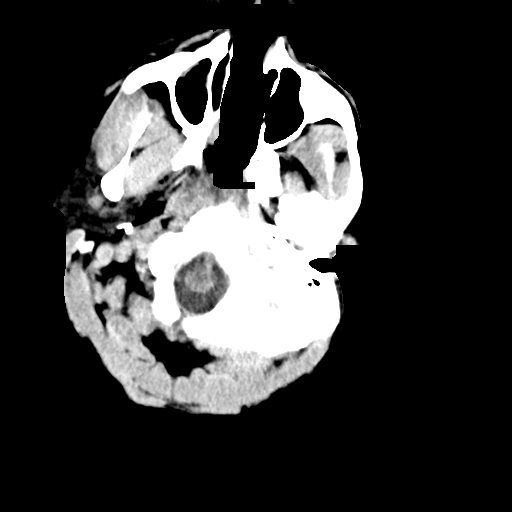
[im 5/36  bone]
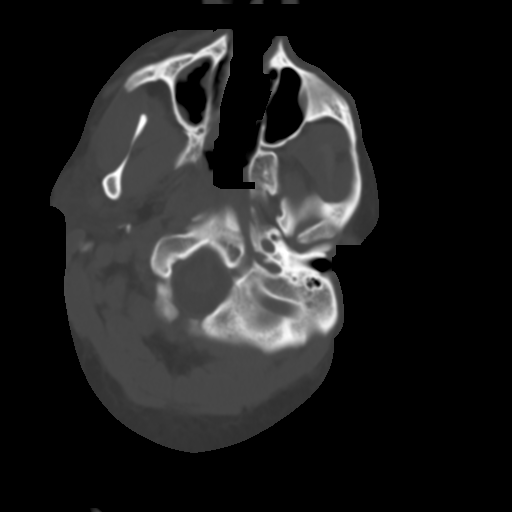
[im 9/36  brain]
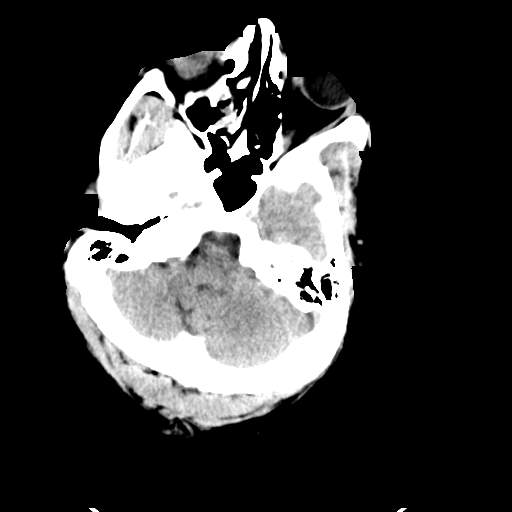
[im 14/36  brain]
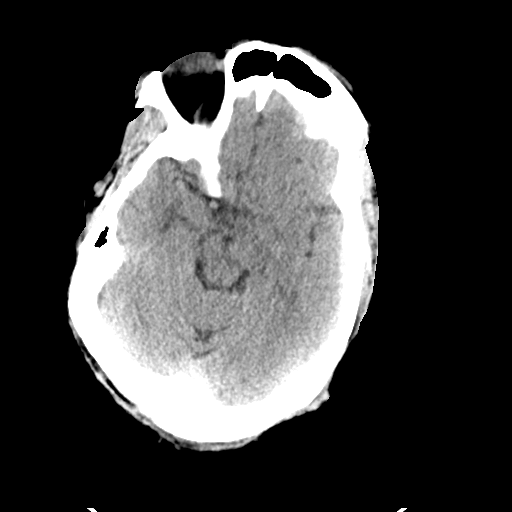
[im 18/36  brain]
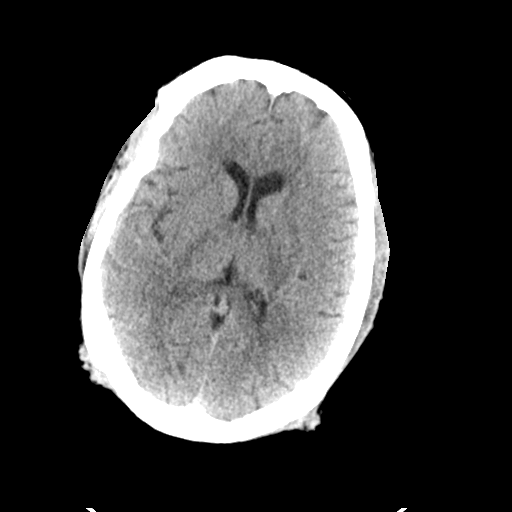
[im 22/36  brain]
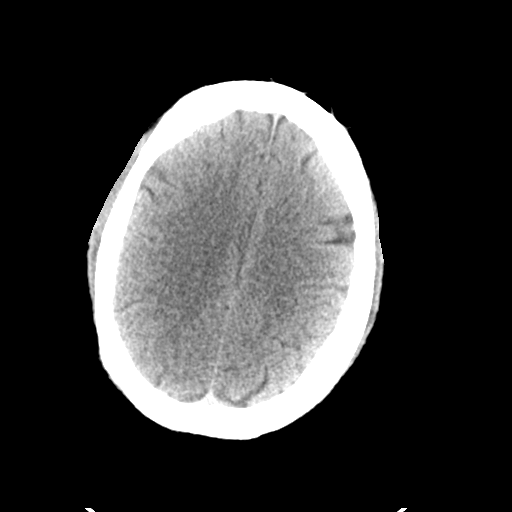
[im 22/36  bone]
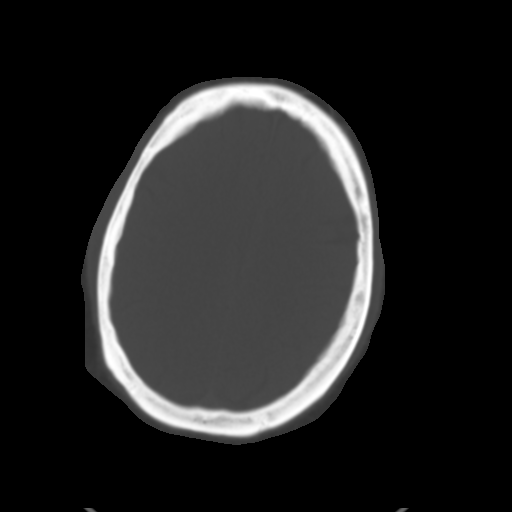
[im 27/36  brain]
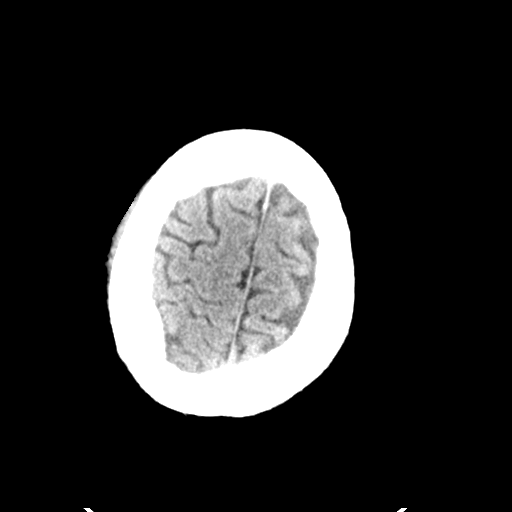
[im 31/36  brain]
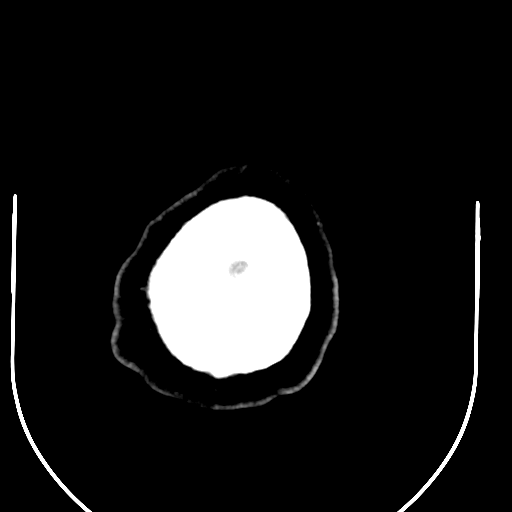

[Series 4: head bone · axial · 0.48mm/px · z∈[-188,-124]mm · 4 of 90 slices shown]
[im 9/90  bone]
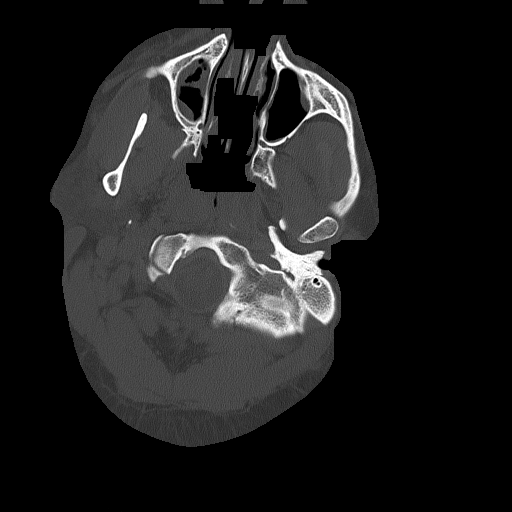
[im 18/90  bone]
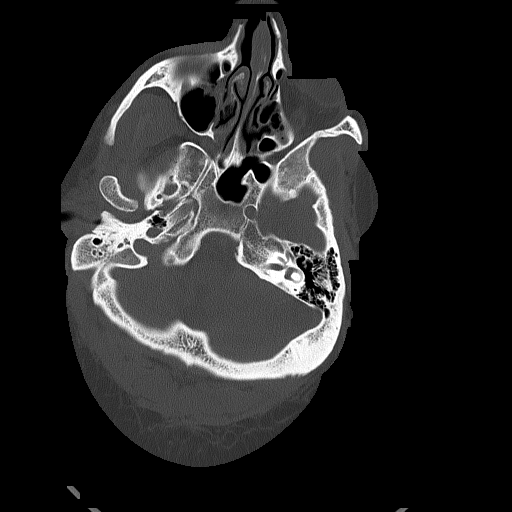
[im 27/90  bone]
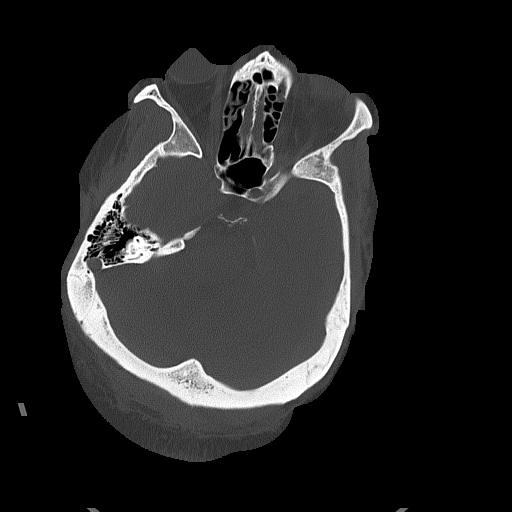
[im 41/90  bone]
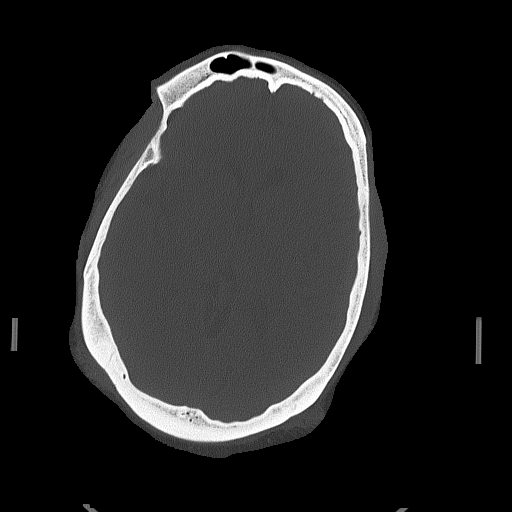

[Series 5: head without cor · coronal · non-contrast · 0.35mm/px · 3 of 78 slices shown]
[im 26/78  brain]
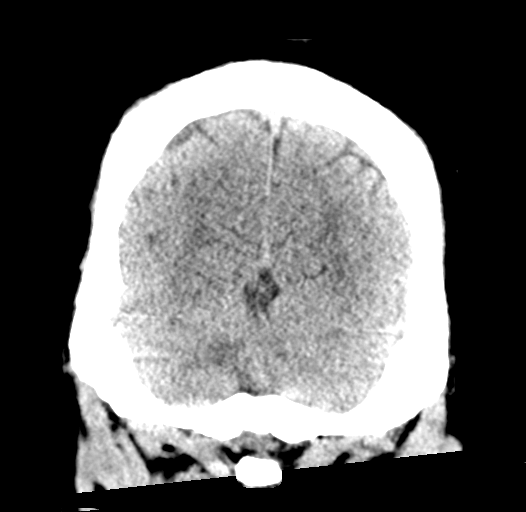
[im 35/78  brain]
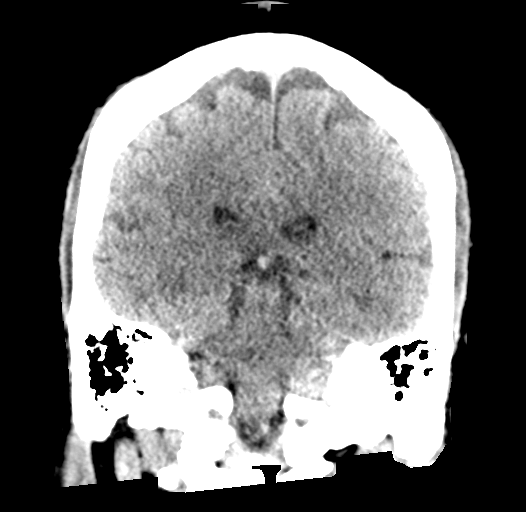
[im 43/78  brain]
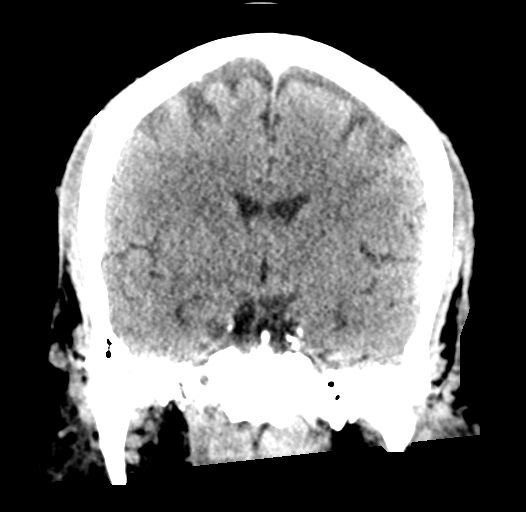

[Series 6: head without sag · sagittal · non-contrast · 0.35mm/px · 3 of 61 slices shown]
[im 24/61  brain]
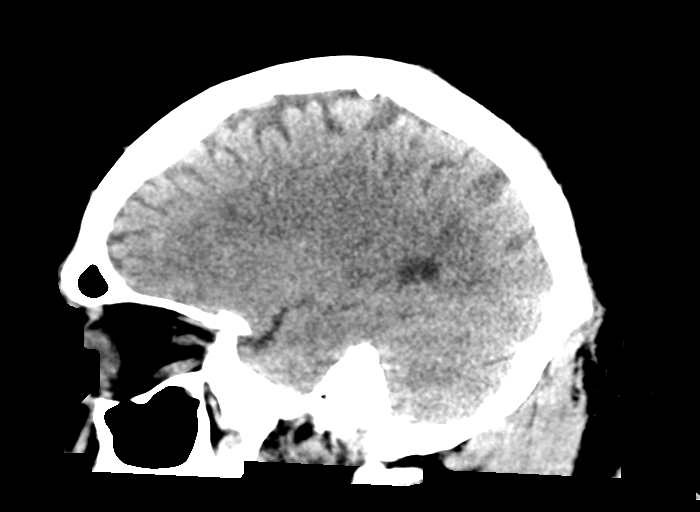
[im 31/61  brain]
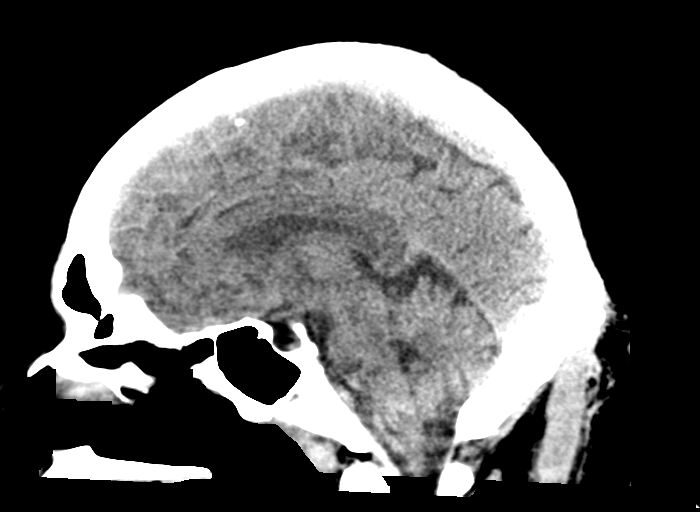
[im 37/61  brain]
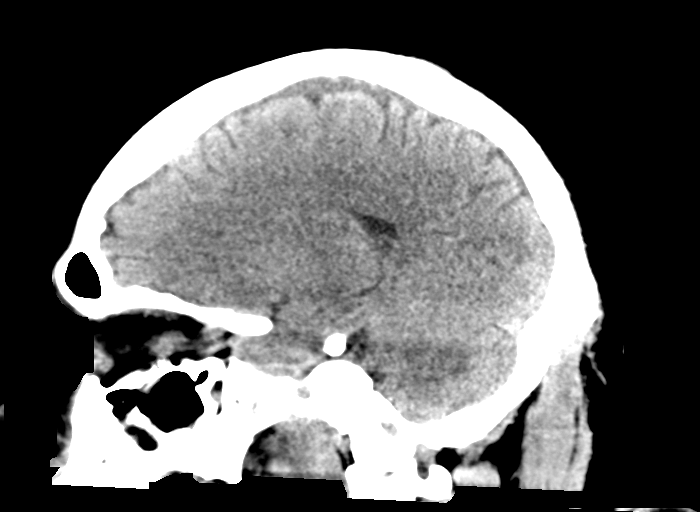

[17 of 47 positions shown; findings below may reference images not displayed]

FINDINGS: Brain: No intracranial hemorrhage, mass effect, or midline shift. No
hydrocephalus. The basilar cisterns are patent. No evidence of
territorial infarct or acute ischemia. No extra-axial or
intracranial fluid collection.

Vascular: No hyperdense vessel or unexpected calcification.

Skull: No fracture or focal lesion.

Sinuses/Orbits: Mucosal thickening in the right greater than
maxillary sinus. No sinus fluid levels. Orbits are unremarkable. The
mastoid air cells are clear.

Other: None.
IMPRESSION: No acute intracranial abnormality.

## 2021-01-10 ENCOUNTER — Other Ambulatory Visit: Payer: Self-pay

## 2021-01-10 DIAGNOSIS — N184 Chronic kidney disease, stage 4 (severe): Secondary | ICD-10-CM

## 2021-01-11 ENCOUNTER — Other Ambulatory Visit (HOSPITAL_COMMUNITY): Payer: Self-pay

## 2021-01-11 ENCOUNTER — Other Ambulatory Visit (HOSPITAL_COMMUNITY): Payer: Self-pay | Admitting: Cardiology

## 2021-01-11 MED ORDER — CALCITRIOL 0.25 MCG PO CAPS
0.2500 ug | ORAL_CAPSULE | Freq: Every day | ORAL | 6 refills | Status: DC
  Filled 2021-01-11: qty 30, 30d supply, fill #0
  Filled 2021-02-12: qty 30, 30d supply, fill #1
  Filled 2021-03-15: qty 30, 30d supply, fill #2
  Filled 2021-04-15: qty 30, 30d supply, fill #3
  Filled 2021-05-02: qty 30, 30d supply, fill #4

## 2021-01-11 MED ORDER — HYDRALAZINE HCL 100 MG PO TABS
100.0000 mg | ORAL_TABLET | Freq: Three times a day (TID) | ORAL | 2 refills | Status: DC
  Filled 2021-01-11: qty 90, 30d supply, fill #0
  Filled 2021-02-20: qty 90, 30d supply, fill #1
  Filled 2021-04-01: qty 90, 30d supply, fill #2

## 2021-01-11 MED FILL — Clonidine HCl Tab 0.3 MG: ORAL | 30 days supply | Qty: 60 | Fill #0 | Status: AC

## 2021-01-11 MED FILL — Glipizide Tab 5 MG: ORAL | 90 days supply | Qty: 90 | Fill #0 | Status: AC

## 2021-01-16 NOTE — Progress Notes (Deleted)
POST OPERATIVE OFFICE NOTE    CC:  F/u for surgery  HPI:  This is a 40 y.o. male who is s/p right RC AVF on 12/18/2020 by Dr. Donzetta Matters   Pt states ***he does *** have pain/numbness in *** hand.    The pt *** on dialysis *** at *** location.   Allergies  Allergen Reactions  . Acyclovir And Related Other (See Comments)    Unknown reaction    Current Outpatient Medications  Medication Sig Dispense Refill  . Accu-Chek FastClix Lancets MISC Check your blood sugars 4 times a day. 402 each 1  . ACCU-CHEK GUIDE test strip USE TO CHECK BLOOD SUGAR 4 TIMES DAILY 375 strip 1  . acetaminophen (TYLENOL) 325 MG tablet Take 650 mg by mouth every 6 (six) hours as needed for moderate pain.    [name] amLODipine (NORVASC) 10 MG tablet Take 1 tablet (10 mg total) by mouth daily. (Patient taking differently: Take 10 mg by mouth at bedtime.) 90 tablet 3  . amLODipine (NORVASC) 10 MG tablet TAKE 1 TABLET (10 MG TOTAL) BY MOUTH DAILY. 90 tablet 3  . atorvastatin (LIPITOR) 80 MG tablet Take 1 tablet (80 mg total) by mouth daily. 90 tablet 3  . atorvastatin (LIPITOR) 80 MG tablet TAKE 1 TABLET BY MOUTH DAILY. 90 tablet 1  . atorvastatin (LIPITOR) 80 MG tablet TAKE 1 TABLET (80 MG TOTAL) BY MOUTH DAILY. 90 tablet 3  . blood glucose meter kit and supplies KIT Dispense based on patient and insurance preference. Use up to four times daily as directed. (FOR ICD-9 250.00, 250.01). 1 each 0  . calcitRIOL (ROCALTROL) 0.25 MCG capsule Take 1 capsule (0.25 mcg total) by mouth daily. 30 capsule 6  . Calcium Acetate 667 MG TABS Take 667 mg by mouth 3 (three) times daily with meals.    . carvedilol (COREG) 25 MG tablet Take 1 tablet (25 mg total) by mouth 2 (two) times daily with a meal. Needs appt for further refills 60 tablet 2  . carvedilol (COREG) 25 MG tablet TAKE 1 TABLET BY MOUTH 2 TIMES DAILY WITH MEAL 60 tablet 0  . carvedilol (COREG) 25 MG tablet TAKE 1 TABLET BY MOUTH TWICE DAILY WITH A MEAL. 60 tablet 0  .  carvedilol (COREG) 25 MG tablet TAKE 1 TABLET BY MOUTH TWICE DAILY WITH MEALS. 60 tablet 4  . carvedilol (COREG) 25 MG tablet TAKE 1 TABLET BY MOUTH TWICE DAILY WITH MEALS. NEEDS APPOINTMENT FOR FURTHER REFILLS. 60 tablet 2  . cloNIDine (CATAPRES) 0.3 MG tablet TAKE 1 TABLET (0.3 MG TOTAL) BY MOUTH TWICE A DAY (Patient taking differently: Take 0.3 mg by mouth 2 (two) times daily.) 60 tablet 6  . cloNIDine (CATAPRES) 0.3 MG tablet Take 1 tablet (0.3 mg total) by mouth 2 (two) times daily. 60 tablet 4  . famotidine (PEPCID) 20 MG tablet Take 20 mg by mouth daily as needed for heartburn or indigestion.    [name] glipiZIDE (GLUCOTROL) 5 MG tablet Take 1 tablet (5 mg total) by mouth daily. 90 tablet 3  . glipiZIDE (GLUCOTROL) 5 MG tablet Take 1 tablet (5 mg total) by mouth daily. 90 tablet 3  . glipiZIDE (GLUCOTROL) 5 MG tablet TAKE 1 TABLET (5 MG TOTAL) BY MOUTH DAILY. 90 tablet 0  . glipiZIDE (GLUCOTROL) 5 MG tablet TAKE 1 TABLET (5 MG TOTAL) BY MOUTH DAILY. 90 tablet 0  . hydrALAZINE (APRESOLINE) 100 MG tablet Take 1 tablet (100 mg total) by mouth 3 (three) times daily.  90 tablet 2  . insulin aspart (NOVOLOG) 100 UNIT/ML injection Inject 17 Units into the skin 3 (three) times daily with meals. (Patient taking differently: Inject 15 Units into the skin 3 (three) times daily with meals.) 10 mL 3  . insulin aspart (NOVOLOG) 100 UNIT/ML injection INJECT 17 UNITS INTO THE SKIN 3 (THREE) TIMES DAILY WITH MEALS. 10 mL 3  . insulin aspart (NOVOLOG) 100 UNIT/ML injection INJECT 20 UNITS INTO THE SKIN 3 (THREE) TIMES DAILY WITH MEALS. 10 mL 0  . insulin glargine (LANTUS) 100 UNIT/ML injection Inject 37 units at bedtime (Patient taking differently: Inject 35 Units into the skin at bedtime.) 10 mL 3  . insulin glargine (LANTUS) 100 UNIT/ML injection INJECT 37 UNITS INTO THE SKIN AT BEDTIME 10 mL 3  . insulin glargine (LANTUS) 100 UNIT/ML injection INJECT 36 UNITS UNDER THE SKIN AT BEDTIME 10 mL 0  . Insulin  Syringe-Needle U-100 (ULTICARE INSULIN SYRINGE) 30G X 1/2" 0.5 ML MISC USE 4 TIMES DAILY WITH INSULIN INJECTIONS. 100 each 3  . Insulin Syringe-Needle U-100 30G X 1/2" 0.5 ML MISC USE 4 TIMES DAILY WITH INSULIN INJECTIONS. 100 each 3  . Insulin Syringe-Needle U-100 30G X 1/2" 0.5 ML MISC USE 4 TIMES DAILY WITH INSULIN INJECTIONS. 100 each 1  . isosorbide mononitrate (IMDUR) 30 MG 24 hr tablet TAKE 1 TABLET BY MOUTH DAILY 30 tablet 6  . isosorbide mononitrate (IMDUR) 30 MG 24 hr tablet Take 30 mg by mouth daily.    . nitroGLYCERIN (NITROSTAT) 0.4 MG SL tablet PLACE 1 TABLET UNDER THE TONGUE EVERY 5 MINUTES AS NEEDED FOR CHEST PAIN. (Patient not taking: Reported on 12/17/2020) 25 tablet 3  . oxyCODONE-acetaminophen (PERCOCET) 5-325 MG tablet Take 1 tablet by mouth every 6 (six) hours as needed for severe pain. 8 tablet 0  . sodium bicarbonate 650 MG tablet Take 1,300 mg by mouth 3 (three) times daily.    . sodium bicarbonate 650 MG tablet TAKE 1 TABLET BY MOUTH 3 TIMES DAILY 90 tablet 5  . UNIFINE PENTIPS 32G X 4 MM MISC USE ONCE DAILY WITH VICTOZA 100 each 1  . Vitamin D, Ergocalciferol, (DRISDOL) 1.25 MG (50000 UNIT) CAPS capsule Take 50,000 Units by mouth 2 (two) times a week.    . Vitamin D, Ergocalciferol, (DRISDOL) 1.25 MG (50000 UNIT) CAPS capsule TAKE 1 CAPSULE BY MOUTH TWICE WEEKLY 8 capsule 12   No current facility-administered medications for this visit.   Facility-Administered Medications Ordered in Other Visits  Medication Dose Route Frequency Provider Last Rate Last Admin  . ceFAZolin (ANCEF) 3 g in dextrose 5 % 50 mL IVPB  3 g Intravenous 30 min Pre-Op Waynetta Sandy, MD         ROS:  See HPI  Physical Exam:  ***  Incision:  *** Extremities:   There *** a palpable *** pulse.   Motor and sensory *** in tact.   There *** a thrill/bruit present.  The fistula/graft *** easily palpable   Dialysis Duplex on 01/25/2021: Diameter:  *** Depth:   ***   Assessment/Plan:  This is a 40 y.o. male who is s/p: right RC AVF on 12/18/2020 by Dr. Donzetta Matters    -the pt does *** have evidence of steal. -the fistula/graft can be used ***. -If pt has a tunneled dialysis catheter and the access has been used successfully to the satisfaction of the dialysis center, the tunneled catheter can be scheduled to be removed at their discretion.   -the pt  will follow up ***   Leontine Locket, [facility] [phone]  Clinic MD:  Donzetta Matters

## 2021-01-22 ENCOUNTER — Other Ambulatory Visit (HOSPITAL_COMMUNITY): Payer: Self-pay

## 2021-01-22 MED FILL — Sodium Bicarbonate Tab 650 MG: ORAL | 30 days supply | Qty: 90 | Fill #1 | Status: AC

## 2021-01-25 ENCOUNTER — Ambulatory Visit (HOSPITAL_COMMUNITY): Admission: RE | Admit: 2021-01-25 | Payer: Medicaid Other | Source: Ambulatory Visit

## 2021-01-30 ENCOUNTER — Other Ambulatory Visit (HOSPITAL_COMMUNITY): Payer: Self-pay

## 2021-01-30 MED FILL — Carvedilol Tab 25 MG: ORAL | 30 days supply | Qty: 60 | Fill #1 | Status: AC

## 2021-01-31 ENCOUNTER — Other Ambulatory Visit (HOSPITAL_COMMUNITY): Payer: Self-pay

## 2021-02-01 ENCOUNTER — Ambulatory Visit (INDEPENDENT_AMBULATORY_CARE_PROVIDER_SITE_OTHER): Payer: Medicaid Other | Admitting: Physician Assistant

## 2021-02-01 ENCOUNTER — Other Ambulatory Visit (HOSPITAL_COMMUNITY): Payer: Self-pay

## 2021-02-01 ENCOUNTER — Other Ambulatory Visit: Payer: Self-pay

## 2021-02-01 ENCOUNTER — Ambulatory Visit (HOSPITAL_COMMUNITY)
Admission: RE | Admit: 2021-02-01 | Discharge: 2021-02-01 | Disposition: A | Discharge status: Home / Self Care | Payer: Medicaid Other | Source: Ambulatory Visit | Attending: Vascular Surgery | Admitting: Vascular Surgery

## 2021-02-01 VITALS — BP 141/91 | HR 66 | Temp 98.1°F | Ht 77.0 in | Wt 290.3 lb

## 2021-02-01 DIAGNOSIS — N184 Chronic kidney disease, stage 4 (severe): Secondary | ICD-10-CM

## 2021-02-01 MED FILL — Isosorbide Mononitrate Tab ER 24HR 30 MG: ORAL | 30 days supply | Qty: 30 | Fill #0 | Status: AC

## 2021-02-01 NOTE — Progress Notes (Signed)
POST OPERATIVE OFFICE NOTE    CC:  F/u for surgery  HPI:  This is a 40 y.o. male who is s/p right RC AVF on 12/18/2020 by Dr. Donzetta Matters for CKD.  He is left hand dominant.    Pt states he does not have pain/numbness in the right hand.    The pt is not yet on dialysis.  His nephrologist is Dr. Justin Mend.   Allergies  Allergen Reactions  . Acyclovir And Related Other (See Comments)    Unknown reaction    Current Outpatient Medications  Medication Sig Dispense Refill  . Accu-Chek FastClix Lancets MISC Check your blood sugars 4 times a day. 402 each 1  . ACCU-CHEK GUIDE test strip USE TO CHECK BLOOD SUGAR 4 TIMES DAILY 375 strip 1  . acetaminophen (TYLENOL) 325 MG tablet Take 650 mg by mouth every 6 (six) hours as needed for moderate pain.    [name] amLODipine (NORVASC) 10 MG tablet Take 1 tablet (10 mg total) by mouth daily. (Patient taking differently: Take 10 mg by mouth at bedtime.) 90 tablet 3  . amLODipine (NORVASC) 10 MG tablet TAKE 1 TABLET (10 MG TOTAL) BY MOUTH DAILY. 90 tablet 3  . atorvastatin (LIPITOR) 80 MG tablet Take 1 tablet (80 mg total) by mouth daily. 90 tablet 3  . atorvastatin (LIPITOR) 80 MG tablet TAKE 1 TABLET BY MOUTH DAILY. 90 tablet 1  . atorvastatin (LIPITOR) 80 MG tablet TAKE 1 TABLET (80 MG TOTAL) BY MOUTH DAILY. 90 tablet 3  . blood glucose meter kit and supplies KIT Dispense based on patient and insurance preference. Use up to four times daily as directed. (FOR ICD-9 250.00, 250.01). 1 each 0  . calcitRIOL (ROCALTROL) 0.25 MCG capsule Take 1 capsule (0.25 mcg total) by mouth daily. 30 capsule 6  . Calcium Acetate 667 MG TABS Take 667 mg by mouth 3 (three) times daily with meals.    . carvedilol (COREG) 25 MG tablet Take 1 tablet (25 mg total) by mouth 2 (two) times daily with a meal. Needs appt for further refills 60 tablet 2  . carvedilol (COREG) 25 MG tablet TAKE 1 TABLET BY MOUTH 2 TIMES DAILY WITH MEAL 60 tablet 0  . carvedilol (COREG) 25 MG tablet TAKE 1 TABLET  BY MOUTH TWICE DAILY WITH A MEAL. 60 tablet 0  . carvedilol (COREG) 25 MG tablet TAKE 1 TABLET BY MOUTH TWICE DAILY WITH MEALS. 60 tablet 4  . carvedilol (COREG) 25 MG tablet TAKE 1 TABLET BY MOUTH TWICE DAILY WITH MEALS. NEEDS APPOINTMENT FOR FURTHER REFILLS. 60 tablet 2  . cloNIDine (CATAPRES) 0.3 MG tablet TAKE 1 TABLET (0.3 MG TOTAL) BY MOUTH TWICE A DAY (Patient taking differently: Take 0.3 mg by mouth 2 (two) times daily.) 60 tablet 6  . cloNIDine (CATAPRES) 0.3 MG tablet Take 1 tablet (0.3 mg total) by mouth 2 (two) times daily. 60 tablet 4  . famotidine (PEPCID) 20 MG tablet Take 20 mg by mouth daily as needed for heartburn or indigestion.    [name] glipiZIDE (GLUCOTROL) 5 MG tablet Take 1 tablet (5 mg total) by mouth daily. 90 tablet 3  . glipiZIDE (GLUCOTROL) 5 MG tablet Take 1 tablet (5 mg total) by mouth daily. 90 tablet 3  . glipiZIDE (GLUCOTROL) 5 MG tablet TAKE 1 TABLET (5 MG TOTAL) BY MOUTH DAILY. 90 tablet 0  . glipiZIDE (GLUCOTROL) 5 MG tablet TAKE 1 TABLET (5 MG TOTAL) BY MOUTH DAILY. 90 tablet 0  . hydrALAZINE (APRESOLINE) 100  MG tablet Take 1 tablet (100 mg total) by mouth 3 (three) times daily. 90 tablet 2  . insulin aspart (NOVOLOG) 100 UNIT/ML injection Inject 17 Units into the skin 3 (three) times daily with meals. (Patient taking differently: Inject 15 Units into the skin 3 (three) times daily with meals.) 10 mL 3  . insulin aspart (NOVOLOG) 100 UNIT/ML injection INJECT 17 UNITS INTO THE SKIN 3 (THREE) TIMES DAILY WITH MEALS. 10 mL 3  . insulin aspart (NOVOLOG) 100 UNIT/ML injection INJECT 20 UNITS INTO THE SKIN 3 (THREE) TIMES DAILY WITH MEALS. 10 mL 0  . insulin glargine (LANTUS) 100 UNIT/ML injection Inject 37 units at bedtime (Patient taking differently: Inject 35 Units into the skin at bedtime.) 10 mL 3  . insulin glargine (LANTUS) 100 UNIT/ML injection INJECT 37 UNITS INTO THE SKIN AT BEDTIME 10 mL 3  . insulin glargine (LANTUS) 100 UNIT/ML injection INJECT 36 UNITS UNDER  THE SKIN AT BEDTIME 10 mL 0  . Insulin Syringe-Needle U-100 (ULTICARE INSULIN SYRINGE) 30G X 1/2" 0.5 ML MISC USE 4 TIMES DAILY WITH INSULIN INJECTIONS. 100 each 3  . Insulin Syringe-Needle U-100 30G X 1/2" 0.5 ML MISC USE 4 TIMES DAILY WITH INSULIN INJECTIONS. 100 each 3  . Insulin Syringe-Needle U-100 30G X 1/2" 0.5 ML MISC USE 4 TIMES DAILY WITH INSULIN INJECTIONS. 100 each 1  . isosorbide mononitrate (IMDUR) 30 MG 24 hr tablet TAKE 1 TABLET BY MOUTH DAILY 30 tablet 6  . isosorbide mononitrate (IMDUR) 30 MG 24 hr tablet Take 30 mg by mouth daily.    . nitroGLYCERIN (NITROSTAT) 0.4 MG SL tablet PLACE 1 TABLET UNDER THE TONGUE EVERY 5 MINUTES AS NEEDED FOR CHEST PAIN. (Patient not taking: Reported on 12/17/2020) 25 tablet 3  . oxyCODONE-acetaminophen (PERCOCET) 5-325 MG tablet Take 1 tablet by mouth every 6 (six) hours as needed for severe pain. 8 tablet 0  . sodium bicarbonate 650 MG tablet Take 1,300 mg by mouth 3 (three) times daily.    . sodium bicarbonate 650 MG tablet TAKE 1 TABLET BY MOUTH 3 TIMES DAILY 90 tablet 5  . UNIFINE PENTIPS 32G X 4 MM MISC USE ONCE DAILY WITH VICTOZA 100 each 1  . Vitamin D, Ergocalciferol, (DRISDOL) 1.25 MG (50000 UNIT) CAPS capsule Take 50,000 Units by mouth 2 (two) times a week.    . Vitamin D, Ergocalciferol, (DRISDOL) 1.25 MG (50000 UNIT) CAPS capsule TAKE 1 CAPSULE BY MOUTH TWICE WEEKLY 8 capsule 12   No current facility-administered medications for this visit.   Facility-Administered Medications Ordered in Other Visits  Medication Dose Route Frequency Provider Last Rate Last Admin  . ceFAZolin (ANCEF) 3 g in dextrose 5 % 50 mL IVPB  3 g Intravenous 30 min Pre-Op Waynetta Sandy, MD         ROS:  See HPI  Physical Exam:  Today's Vitals   02/01/21 1542  BP: (!) 141/91  Pulse: 66  Temp: 98.1 F (36.7 C)  TempSrc: Skin  SpO2: 98%  Weight: 290 lb 4.8 oz (131.7 kg)  Height: $Remove'6\' 5"'UNvXdgv$  (1.956 m)   Body mass index is 34.42  kg/m.   Incision:  Healed nicely Extremities:   There is a palpable right radial pulse.   Motor and sensory are in tact.   There is a thrill/bruit present.  The fistula is easily palpable distally but becomes more difficult to feel proximally   Dialysis Duplex on 02/01/2021: +------------+----------+-------------+----------+---------------------+  OUTFLOW VEINPSV (cm/s)Diameter (cm)Depth (cm)   Describe      +------------+----------+-------------+----------+---------------------+  Mid UA     66    0.67     0.54               +------------+----------+-------------+----------+---------------------+  Dist UA     56    0.47     0.28               +------------+----------+-------------+----------+---------------------+  AC Fossa    48    0.62     0.26               +------------+----------+-------------+----------+---------------------+  Prox Forearm  81    0.63     0.54               +------------+----------+-------------+----------+---------------------+  Mid Forearm   72    0.56     0.53               +------------+----------+-------------+----------+---------------------+  Dist Forearm  267    0.35     0.46  competing branch 0.25  +------------+----------+-------------+----------+---------------------+   Assessment/Plan:  This is a 40 y.o. male who is s/p:  right RC AVF on 12/18/2020 by Dr. Donzetta Matters for CKD.  -the pt does not have evidence of steal. -the fistula is maturing nicely and can be used 03/19/2021.  It is easily palpable distally but becomes more difficult to feel proximally.  On duplex, it is maturing nicely and should be ready for use.  Once it is being used, and if there is difficulty, we can do a fistulogram at that time.  Otherwise, he will continue to exercise his hand. -If pt has a tunneled dialysis catheter and  the access has been used successfully to the satisfaction of the dialysis center, the tunneled catheter can be scheduled to be removed at their discretion.   -discussed with pt that access does not last forever and will need intervention or even new access at some point.  -the pt will follow up prn   Leontine Locket, [facility] [phone]  Clinic MD:  Donzetta Matters

## 2021-02-08 ENCOUNTER — Encounter: Payer: Self-pay | Admitting: Student

## 2021-02-08 ENCOUNTER — Encounter (HOSPITAL_COMMUNITY): Payer: Self-pay

## 2021-02-08 ENCOUNTER — Encounter (HOSPITAL_COMMUNITY): Payer: Self-pay | Admitting: *Deleted

## 2021-02-08 ENCOUNTER — Other Ambulatory Visit (HOSPITAL_COMMUNITY): Payer: Self-pay | Admitting: Cardiology

## 2021-02-08 ENCOUNTER — Other Ambulatory Visit (HOSPITAL_COMMUNITY): Payer: Self-pay

## 2021-02-08 ENCOUNTER — Other Ambulatory Visit: Payer: Self-pay

## 2021-02-08 MED ORDER — CLONIDINE HCL 0.3 MG PO TABS
0.3000 mg | ORAL_TABLET | Freq: Two times a day (BID) | ORAL | 4 refills | Status: DC
  Filled 2021-02-08: qty 60, 30d supply, fill #0
  Filled 2021-03-12: qty 60, 30d supply, fill #1

## 2021-02-08 MED ORDER — SODIUM BICARBONATE 650 MG PO TABS
1300.0000 mg | ORAL_TABLET | Freq: Three times a day (TID) | ORAL | 5 refills | Status: DC
  Filled 2021-02-08: qty 90, 15d supply, fill #0
  Filled 2021-02-26: qty 90, 15d supply, fill #1
  Filled 2021-03-18: qty 90, 15d supply, fill #2
  Filled 2021-04-01: qty 90, 15d supply, fill #3
  Filled 2021-04-19: qty 90, 15d supply, fill #4
  Filled 2021-05-07 (×2): qty 90, 15d supply, fill #5
  Filled 2021-05-23: qty 18, 3d supply, fill #6

## 2021-02-08 MED ORDER — INSULIN ASPART 100 UNIT/ML IJ SOLN
17.0000 [IU] | Freq: Three times a day (TID) | INTRAMUSCULAR | 1 refills | Status: DC
  Filled 2021-02-08: qty 10, 20d supply, fill #0

## 2021-02-08 MED FILL — Insulin Glargine Inj 100 Unit/ML: SUBCUTANEOUS | 27 days supply | Qty: 10 | Fill #1 | Status: AC

## 2021-02-08 MED FILL — Ergocalciferol Cap 1.25 MG (50000 Unit): ORAL | 28 days supply | Qty: 8 | Fill #1 | Status: AC

## 2021-02-08 MED FILL — Atorvastatin Calcium Tab 80 MG (Base Equivalent): ORAL | 90 days supply | Qty: 90 | Fill #0 | Status: AC

## 2021-02-11 ENCOUNTER — Other Ambulatory Visit: Payer: Self-pay | Admitting: Student

## 2021-02-12 ENCOUNTER — Encounter (HOSPITAL_COMMUNITY): Payer: Medicaid Other | Admitting: Cardiology

## 2021-02-12 ENCOUNTER — Other Ambulatory Visit (HOSPITAL_COMMUNITY): Payer: Self-pay

## 2021-02-13 ENCOUNTER — Other Ambulatory Visit (HOSPITAL_COMMUNITY): Payer: Self-pay

## 2021-02-20 ENCOUNTER — Other Ambulatory Visit (HOSPITAL_COMMUNITY): Payer: Self-pay

## 2021-02-22 ENCOUNTER — Other Ambulatory Visit (HOSPITAL_COMMUNITY): Payer: Self-pay

## 2021-02-22 DIAGNOSIS — E1129 Type 2 diabetes mellitus with other diabetic kidney complication: Secondary | ICD-10-CM | POA: Diagnosis not present

## 2021-02-22 DIAGNOSIS — N2581 Secondary hyperparathyroidism of renal origin: Secondary | ICD-10-CM | POA: Diagnosis not present

## 2021-02-22 DIAGNOSIS — N189 Chronic kidney disease, unspecified: Secondary | ICD-10-CM | POA: Diagnosis not present

## 2021-02-22 DIAGNOSIS — N185 Chronic kidney disease, stage 5: Secondary | ICD-10-CM | POA: Diagnosis not present

## 2021-02-22 DIAGNOSIS — D631 Anemia in chronic kidney disease: Secondary | ICD-10-CM | POA: Diagnosis not present

## 2021-02-22 DIAGNOSIS — I120 Hypertensive chronic kidney disease with stage 5 chronic kidney disease or end stage renal disease: Secondary | ICD-10-CM | POA: Diagnosis not present

## 2021-02-22 MED ORDER — ONDANSETRON 4 MG PO TBDP
4.0000 mg | ORAL_TABLET | Freq: Three times a day (TID) | ORAL | 3 refills | Status: DC | PRN
  Filled 2021-02-22: qty 30, 10d supply, fill #0

## 2021-02-26 ENCOUNTER — Other Ambulatory Visit (HOSPITAL_COMMUNITY): Payer: Self-pay

## 2021-02-26 ENCOUNTER — Encounter (HOSPITAL_COMMUNITY): Payer: Self-pay | Admitting: *Deleted

## 2021-03-04 ENCOUNTER — Other Ambulatory Visit (HOSPITAL_COMMUNITY): Payer: Self-pay | Admitting: Cardiology

## 2021-03-04 ENCOUNTER — Other Ambulatory Visit (HOSPITAL_COMMUNITY): Payer: Self-pay

## 2021-03-05 ENCOUNTER — Other Ambulatory Visit (HOSPITAL_COMMUNITY): Payer: Self-pay

## 2021-03-05 MED ORDER — CARVEDILOL 25 MG PO TABS
25.0000 mg | ORAL_TABLET | Freq: Two times a day (BID) | ORAL | 2 refills | Status: DC
  Filled 2021-03-05: qty 60, 30d supply, fill #0

## 2021-03-06 ENCOUNTER — Other Ambulatory Visit (HOSPITAL_COMMUNITY): Payer: Self-pay

## 2021-03-08 ENCOUNTER — Other Ambulatory Visit (HOSPITAL_COMMUNITY): Payer: Self-pay

## 2021-03-08 MED FILL — Ergocalciferol Cap 1.25 MG (50000 Unit): ORAL | 28 days supply | Qty: 8 | Fill #2 | Status: AC

## 2021-03-08 MED FILL — Isosorbide Mononitrate Tab ER 24HR 30 MG: ORAL | 30 days supply | Qty: 30 | Fill #1 | Status: AC

## 2021-03-12 ENCOUNTER — Other Ambulatory Visit (HOSPITAL_COMMUNITY): Payer: Self-pay

## 2021-03-15 ENCOUNTER — Other Ambulatory Visit (HOSPITAL_COMMUNITY): Payer: Self-pay

## 2021-03-18 ENCOUNTER — Other Ambulatory Visit (HOSPITAL_COMMUNITY): Payer: Self-pay

## 2021-03-18 ENCOUNTER — Encounter (HOSPITAL_COMMUNITY): Payer: Self-pay | Admitting: *Deleted

## 2021-03-27 DIAGNOSIS — E1129 Type 2 diabetes mellitus with other diabetic kidney complication: Secondary | ICD-10-CM | POA: Diagnosis not present

## 2021-03-27 DIAGNOSIS — N2581 Secondary hyperparathyroidism of renal origin: Secondary | ICD-10-CM | POA: Diagnosis not present

## 2021-03-27 DIAGNOSIS — I120 Hypertensive chronic kidney disease with stage 5 chronic kidney disease or end stage renal disease: Secondary | ICD-10-CM | POA: Diagnosis not present

## 2021-03-27 DIAGNOSIS — D631 Anemia in chronic kidney disease: Secondary | ICD-10-CM | POA: Diagnosis not present

## 2021-03-27 DIAGNOSIS — N189 Chronic kidney disease, unspecified: Secondary | ICD-10-CM | POA: Diagnosis not present

## 2021-03-27 DIAGNOSIS — N185 Chronic kidney disease, stage 5: Secondary | ICD-10-CM | POA: Diagnosis not present

## 2021-04-01 ENCOUNTER — Other Ambulatory Visit (HOSPITAL_COMMUNITY): Payer: Self-pay

## 2021-04-01 MED FILL — Ergocalciferol Cap 1.25 MG (50000 Unit): ORAL | 28 days supply | Qty: 8 | Fill #3 | Status: AC

## 2021-04-03 ENCOUNTER — Other Ambulatory Visit: Payer: Self-pay

## 2021-04-03 ENCOUNTER — Encounter (HOSPITAL_COMMUNITY): Payer: Self-pay | Admitting: Cardiology

## 2021-04-03 ENCOUNTER — Ambulatory Visit (HOSPITAL_COMMUNITY)
Admission: RE | Admit: 2021-04-03 | Discharge: 2021-04-03 | Disposition: A | Discharge status: Home / Self Care | Payer: Medicaid Other | Source: Ambulatory Visit | Attending: Cardiology | Admitting: Cardiology

## 2021-04-03 ENCOUNTER — Other Ambulatory Visit (HOSPITAL_COMMUNITY): Payer: Self-pay

## 2021-04-03 VITALS — BP 148/78 | HR 74 | Wt 284.8 lb

## 2021-04-03 DIAGNOSIS — I5032 Chronic diastolic (congestive) heart failure: Secondary | ICD-10-CM | POA: Diagnosis not present

## 2021-04-03 DIAGNOSIS — E785 Hyperlipidemia, unspecified: Secondary | ICD-10-CM | POA: Insufficient documentation

## 2021-04-03 DIAGNOSIS — N049 Nephrotic syndrome with unspecified morphologic changes: Secondary | ICD-10-CM | POA: Insufficient documentation

## 2021-04-03 DIAGNOSIS — I428 Other cardiomyopathies: Secondary | ICD-10-CM | POA: Diagnosis not present

## 2021-04-03 DIAGNOSIS — Z79899 Other long term (current) drug therapy: Secondary | ICD-10-CM | POA: Diagnosis not present

## 2021-04-03 DIAGNOSIS — Z8249 Family history of ischemic heart disease and other diseases of the circulatory system: Secondary | ICD-10-CM | POA: Insufficient documentation

## 2021-04-03 DIAGNOSIS — E1122 Type 2 diabetes mellitus with diabetic chronic kidney disease: Secondary | ICD-10-CM | POA: Insufficient documentation

## 2021-04-03 DIAGNOSIS — Z8616 Personal history of COVID-19: Secondary | ICD-10-CM | POA: Insufficient documentation

## 2021-04-03 DIAGNOSIS — Z794 Long term (current) use of insulin: Secondary | ICD-10-CM | POA: Diagnosis not present

## 2021-04-03 DIAGNOSIS — Z992 Dependence on renal dialysis: Secondary | ICD-10-CM | POA: Insufficient documentation

## 2021-04-03 DIAGNOSIS — I5042 Chronic combined systolic (congestive) and diastolic (congestive) heart failure: Secondary | ICD-10-CM | POA: Diagnosis not present

## 2021-04-03 DIAGNOSIS — I10 Essential (primary) hypertension: Secondary | ICD-10-CM

## 2021-04-03 DIAGNOSIS — I132 Hypertensive heart and chronic kidney disease with heart failure and with stage 5 chronic kidney disease, or end stage renal disease: Secondary | ICD-10-CM | POA: Diagnosis not present

## 2021-04-03 DIAGNOSIS — N186 End stage renal disease: Secondary | ICD-10-CM | POA: Insufficient documentation

## 2021-04-03 MED ORDER — CARVEDILOL 25 MG PO TABS
37.5000 mg | ORAL_TABLET | Freq: Two times a day (BID) | ORAL | 3 refills | Status: DC
  Filled 2021-04-03: qty 270, 90d supply, fill #0
  Filled 2021-07-16: qty 270, 90d supply, fill #1
  Filled 2021-10-07: qty 270, 90d supply, fill #2
  Filled 2022-01-22: qty 270, 90d supply, fill #3

## 2021-04-03 NOTE — Patient Instructions (Signed)
EKG done today.  No Labs done today.   INCREASE Carvedilol to 37.'5mg'$  (1 & 1/2 tablet) by mouth 2 times daily.   No other medication changes were made. Please continue all current medications as prescribed.  Your physician recommends that you schedule a follow-up appointment soon for an echo and in 1 year with our [facility] here in our office.  Please contact our office in June 2023 to schedule a July appointment.  Your physician has requested that you have an echocardiogram. Echocardiography is a painless test that uses sound waves to create images of your heart. It provides your doctor with information about the size and shape of your heart and how well your heart's chambers and valves are working. This procedure takes approximately one hour. There are no restrictions for this procedure.  If you have any questions or concerns before your next appointment please send [place] a message through [place] or call our office at [phone].    TO LEAVE A MESSAGE FOR THE NURSE SELECT OPTION 2, PLEASE LEAVE A MESSAGE INCLUDING: YOUR NAME DATE OF BIRTH CALL BACK NUMBER REASON FOR CALL**this is important as we prioritize the call backs  YOU WILL RECEIVE A CALL BACK THE SAME DAY AS LONG AS YOU CALL BEFORE 4:00 PM   Do the following things EVERYDAY: Weigh yourself in the morning before breakfast. Write it down and keep it in a log. Take your medicines as prescribed Eat low salt foods--Limit salt (sodium) to 2000 mg per day.  Stay as active as you can everyday Limit all fluids for the day to less than 2 liters   At the [facility], you and your health needs are our priority. As part of our continuing mission to provide you with exceptional heart care, we have created designated Provider Care Teams. These Care Teams include your primary Cardiologist (physician) and Advanced Practice Providers (APPs- Physician Assistants and Nurse Practitioners) who all work together to provide you  with the care you need, when you need it.   You may see any of the following providers on your designated Care Team at your next follow up: Dr Glori Bickers Dr Haynes Kerns, NP Lyda Jester, [place] Audry Riles, PharmD   Please be sure to bring in all your medications bottles to every appointment.

## 2021-04-03 NOTE — Progress Notes (Signed)
Patient ID: Jeremy [name], male   DOB: 1981/03/22, 40 y.o.   MRN: [id number]    Advanced Heart Failure Clinic Note   PCP: Dr. Randell Patient Primary cardiologist: Dr. Aundra Dubin  40 y.o. with history of nonischemic cardiomyopathy, HTN, and DM presents for cardiology followup.  In 4/16, patient was seen in the ER with edema and chest pain.  The only medication he was taking was metformin.  BP was elevated.  Echo showed EF 20-25%, and Cardiolite suggested ischemia.  He had RHC/LHC in 5/16.  This showed no coronary disease and mildly elevated filling pressures.    Admitted 9/21 through 06/03/16 with marked volume overload and hypertensive crisis. Diuresed with IV lasix and transitioned to lasix 160 mg twice daily. Due to worsening renal function, nephrology consulted. Work up consistent with nephrotic syndrome. 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  Discharge creatinine 3.42.  He has been following with renal. EF improved to 55% on 9/17 echo.    Echo in 5/19 showed EF 50-55%, moderate LVH, normal RV.   Echo in 10/20 showed EF up to 55-60%, normal RV.   He was admitted in 2/21 with COVID-19 infection and developed AKI proceeding to ESRD.  He was able to come off HD eventually, and was been off HD for almost a year.  However, creatinine is worsening, and he is going to be going back on HD in August.   He presents today for followup of CHF and HTN.  BP is mildly elevated today.  He is still making urine and is not on a diuretic.  He had AVF placed in right arm.  No chest pain or exertional dyspnea. No lightheadedness.      ECG (personally reviewed): NSR, LVH with repolarization  Labs (6/16): LDL 308, HDL 46, TGs 284 Labs (7/16): K 3.5, creatinine 1.0 Labs 05/24/2015: K 3.9 Creatinine 1.43  Labs (3/17): creatinine 1.55, LDL 108, HDL 31 Labs (9/17): hemoglobin 8.3, K 4, BUN 40, creatinine 3.69, BNP 56 Labs (06/03/2016) : K 4.6 Creatinine 3.42  Labs (10/17): K  4.4, creatinine 3.44, hgb 9.4 Labs (1/18): K 4.9, creatinine 1.85 Labs (4/19): K 4.6, creatinine 2.14 Labs (8/20): K 4.4, creatinine 2.81 Labs (2/22): LDL 99  PMH: 1. Type II diabetes since 2006.  2. HTN since around 2010.   3. Asthma 4. Hyperlipidemia 5. Morbid obesity 6. Cardiomyopathy: Nonischemic.  Echo (5/16) with EF 20-25%, diffuse hypokinesis, grade II diastolic dysfunction.  Lexiscan Cardiolite (5/16) with EF 28%, inferior/inferolateral/apical ischemia.  RHC/LHC (5/16) with normal coronaries, mean RA 11, PA 33/21 mean 28, mean PCWP 18, CI 2.53.  - Echo (4/17): EF 50%, moderate LVH, normal RV size and systolic function. Grade IDD  - Echo (9/17): EF 55%.  - Echo (5/19): EF 50-55%, moderate LVH, normal RV size and systolic function.  - Echo (10/20): EF 55-60%, normal RV.  7. ABIs normal 8/16.  8. ESRD: AKI in 9/17 with diagnosis of nephrotic syndrome. 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  9. COVID-19 infection 2/21.   SH: Lives with girlfriend, nonsmoker, no cocaine, occasional marijuana, no ETOH.  Works as Art gallery manager.  FH: Father with CHF, died at 31.  Sister with CHF, PAD.   Review of systems complete and found to be negative unless listed in HPI.    Current Outpatient Medications  Medication Sig Dispense Refill   Accu-Chek FastClix Lancets MISC Check your blood sugars 4 times a day. 402 each 1   ACCU-CHEK GUIDE test strip  USE TO CHECK BLOOD SUGAR 4 TIMES DAILY 375 strip 1   acetaminophen (TYLENOL) 325 MG tablet Take 650 mg by mouth every 6 (six) hours as needed for moderate pain.     amLODipine (NORVASC) 10 MG tablet Take 1 tablet (10 mg total) by mouth daily. 90 tablet 3   atorvastatin (LIPITOR) 80 MG tablet Take 1 tablet (80 mg total) by mouth daily. 90 tablet 3   blood glucose meter kit and supplies KIT Dispense based on patient and insurance preference. Use up to four times daily as directed. (FOR ICD-9 250.00, 250.01). 1 each 0    calcitRIOL (ROCALTROL) 0.25 MCG capsule Take 1 capsule (0.25 mcg total) by mouth daily. 30 capsule 6   Calcium Acetate 667 MG TABS Take 667 mg by mouth 3 (three) times daily with meals.     cloNIDine (CATAPRES) 0.3 MG tablet TAKE 1 TABLET (0.3 MG TOTAL) BY MOUTH TWICE A DAY 60 tablet 6   famotidine (PEPCID) 20 MG tablet Take 20 mg by mouth daily as needed for heartburn or indigestion.     glipiZIDE (GLUCOTROL) 5 MG tablet Take 1 tablet (5 mg total) by mouth daily. 90 tablet 3   hydrALAZINE (APRESOLINE) 100 MG tablet Take 1 tablet (100 mg total) by mouth 3 (three) times daily. 90 tablet 2   insulin aspart (NOVOLOG) 100 UNIT/ML injection Inject 15 Units into the skin 3 (three) times daily with meals.     insulin glargine (LANTUS) 100 UNIT/ML injection Inject 35 Units into the skin at bedtime.     Insulin Syringe-Needle U-100 (ULTICARE INSULIN SYRINGE) 30G X 1/2" 0.5 ML MISC USE 4 TIMES DAILY WITH INSULIN INJECTIONS. 100 each 3   Insulin Syringe-Needle U-100 30G X 1/2" 0.5 ML MISC USE 4 TIMES DAILY WITH INSULIN INJECTIONS. 100 each 3   isosorbide mononitrate (IMDUR) 30 MG 24 hr tablet TAKE 1 TABLET BY MOUTH DAILY 30 tablet 6   isosorbide mononitrate (IMDUR) 30 MG 24 hr tablet Take 30 mg by mouth daily.     nitroGLYCERIN (NITROSTAT) 0.4 MG SL tablet PLACE 1 TABLET UNDER THE TONGUE EVERY 5 MINUTES AS NEEDED FOR CHEST PAIN. 25 tablet 3   ondansetron (ZOFRAN-ODT) 4 MG disintegrating tablet Dissolve 1 (one) Tablet by mouth every 8 hours as needed for nausea and vomiting 30 tablet 3   sodium bicarbonate 650 MG tablet Take 2 tablets (1,300 mg total) by mouth 3 (three) times daily. 90 tablet 5   UNIFINE PENTIPS 32G X 4 MM MISC USE ONCE DAILY WITH VICTOZA 100 each 1   Vitamin D, Ergocalciferol, (DRISDOL) 1.25 MG (50000 UNIT) CAPS capsule TAKE 1 CAPSULE BY MOUTH TWICE WEEKLY 8 capsule 12   carvedilol (COREG) 25 MG tablet Take 1.5 tablets (37.5 mg total) by mouth 2 (two) times daily with a meal. 270 tablet 3    No current facility-administered medications for this encounter.   Facility-Administered Medications Ordered in Other Encounters  Medication Dose Route Frequency Provider Last Rate Last Admin   ceFAZolin (ANCEF) 3 g in dextrose 5 % 50 mL IVPB  3 g Intravenous 30 min Pre-Op Waynetta Sandy, MD       Vitals:   04/03/21 1115  BP: (!) 148/78  Pulse: 74  SpO2: 98%  Weight: 129.2 kg (284 lb 12.8 oz)   Wt Readings from Last 3 Encounters:  04/03/21 129.2 kg (284 lb 12.8 oz)  02/01/21 131.7 kg (290 lb 4.8 oz)  12/18/20 127 kg (279 lb 15.8 oz)  Physical Exam General: NAD Neck: No JVD, no thyromegaly or thyroid nodule.  Lungs: Clear to auscultation bilaterally with normal respiratory effort. CV: Nondisplaced PMI.  Heart regular S1/S2, no S3/S4, no murmur.  No peripheral edema.  No carotid bruit.  Normal pedal pulses.  Abdomen: Soft, nontender, no hepatosplenomegaly, no distention.  Skin: Intact without lesions or rashes.  Neurologic: Alert and oriented x 3.  Psych: Normal affect. Extremities: No clubbing or cyanosis.  HEENT: Normal.   Assessment/Plan:  1. Chronic diastolic CHF: Nonischemic cardiomyopathy, ?from long-standing HTN.  EF was 20-25% in 2006 but EF has improved.  Echo in 10/20 showed EF 55-60% with normal RV systolic function. NYHA class II.  He is not volume overloaded on exam though he is not on a diuretic.  - I will arrange for repeat echo to reassess EF.  2.  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   - Continue atorvastatin.  3. HTN: BP still high.   - Continue amlodipine, clonidine and hydralazine/Imdur at current doses.  - Increase Coreg to 37.5 mg bid.  4. 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 Had ESRD on HD after COVID-19 infection, was able to go off HD for about a year, but now preparing to go back on HD.   Followup 1 year with APP if EF remains normal on echo.  Loralie Champagne, MD  04/03/2021

## 2021-04-04 ENCOUNTER — Other Ambulatory Visit: Payer: Self-pay

## 2021-04-04 ENCOUNTER — Ambulatory Visit (INDEPENDENT_AMBULATORY_CARE_PROVIDER_SITE_OTHER): Payer: Medicaid Other | Admitting: Physician Assistant

## 2021-04-04 VITALS — BP 117/71 | HR 83 | Temp 98.0°F | Resp 20 | Ht 77.0 in | Wt 286.3 lb

## 2021-04-04 DIAGNOSIS — T82898D Other specified complication of vascular prosthetic devices, implants and grafts, subsequent encounter: Secondary | ICD-10-CM

## 2021-04-04 NOTE — H&P (View-Only) (Signed)
POST OPERATIVE DIALYSIS ACCESS OFFICE NOTE    CC:  F/u for dialysis access surgery  HPI:  This is a 40 y.o. male who is s/p ight arm radial artery to cephalic vein av fistula creation by Dr. Donzetta Matters in 12/18/2020. He was alst seen on 02/01/2021 and noted to have good maturation and AVF would be ready for access, if necessary, on 03/19/2021. Dr. Justin Mend has asked [place] to re-assess capability to access his fistula. He is very close to needing dialysis per Dr. Jason Nest progress note dated 03/27/2021.  He has occasional tingling in his right hand; no pain. His wife accompanies him today.  Allergies  Allergen Reactions   Acyclovir And Related Other (See Comments)    Unknown reaction    Current Outpatient Medications  Medication Sig Dispense Refill   Accu-Chek FastClix Lancets MISC Check your blood sugars 4 times a day. 402 each 1   ACCU-CHEK GUIDE test strip USE TO CHECK BLOOD SUGAR 4 TIMES DAILY 375 strip 1   acetaminophen (TYLENOL) 325 MG tablet Take 650 mg by mouth every 6 (six) hours as needed for moderate pain.     amLODipine (NORVASC) 10 MG tablet Take 1 tablet (10 mg total) by mouth daily. 90 tablet 3   atorvastatin (LIPITOR) 80 MG tablet Take 1 tablet (80 mg total) by mouth daily. 90 tablet 3   blood glucose meter kit and supplies KIT Dispense based on patient and insurance preference. Use up to four times daily as directed. (FOR ICD-9 250.00, 250.01). 1 each 0   calcitRIOL (ROCALTROL) 0.25 MCG capsule Take 1 capsule (0.25 mcg total) by mouth daily. 30 capsule 6   Calcium Acetate 667 MG TABS Take 667 mg by mouth 3 (three) times daily with meals.     carvedilol (COREG) 25 MG tablet Take 1.5 tablets (37.5 mg total) by mouth 2 (two) times daily with a meal. 270 tablet 3   cloNIDine (CATAPRES) 0.3 MG tablet TAKE 1 TABLET (0.3 MG TOTAL) BY MOUTH TWICE A DAY 60 tablet 6   famotidine (PEPCID) 20 MG tablet Take 20 mg by mouth daily as needed for heartburn or indigestion.     glipiZIDE (GLUCOTROL) 5 MG  tablet Take 1 tablet (5 mg total) by mouth daily. 90 tablet 3   hydrALAZINE (APRESOLINE) 100 MG tablet Take 1 tablet (100 mg total) by mouth 3 (three) times daily. 90 tablet 2   insulin aspart (NOVOLOG) 100 UNIT/ML injection Inject 15 Units into the skin 3 (three) times daily with meals.     insulin glargine (LANTUS) 100 UNIT/ML injection Inject 35 Units into the skin at bedtime.     Insulin Syringe-Needle U-100 (ULTICARE INSULIN SYRINGE) 30G X 1/2" 0.5 ML MISC USE 4 TIMES DAILY WITH INSULIN INJECTIONS. 100 each 3   Insulin Syringe-Needle U-100 30G X 1/2" 0.5 ML MISC USE 4 TIMES DAILY WITH INSULIN INJECTIONS. 100 each 3   isosorbide mononitrate (IMDUR) 30 MG 24 hr tablet TAKE 1 TABLET BY MOUTH DAILY 30 tablet 6   isosorbide mononitrate (IMDUR) 30 MG 24 hr tablet Take 30 mg by mouth daily.     nitroGLYCERIN (NITROSTAT) 0.4 MG SL tablet PLACE 1 TABLET UNDER THE TONGUE EVERY 5 MINUTES AS NEEDED FOR CHEST PAIN. 25 tablet 3   ondansetron (ZOFRAN-ODT) 4 MG disintegrating tablet Dissolve 1 (one) Tablet by mouth every 8 hours as needed for nausea and vomiting 30 tablet 3   sodium bicarbonate 650 MG tablet Take 2 tablets (1,300 mg total) by mouth 3 (  three) times daily. 90 tablet 5   UNIFINE PENTIPS 32G X 4 MM MISC USE ONCE DAILY WITH VICTOZA 100 each 1   Vitamin D, Ergocalciferol, (DRISDOL) 1.25 MG (50000 UNIT) CAPS capsule TAKE 1 CAPSULE BY MOUTH TWICE WEEKLY 8 capsule 12   No current facility-administered medications for this visit.   Facility-Administered Medications Ordered in Other Visits  Medication Dose Route Frequency Provider Last Rate Last Admin   ceFAZolin (ANCEF) 3 g in dextrose 5 % 50 mL IVPB  3 g Intravenous 30 min Pre-Op Waynetta Sandy, MD         ROS:  See HPI  Vitals:   04/04/21 1336  BP: 117/71  Pulse: 83  Resp: 20  Temp: 98 F (36.7 C)  SpO2: 99%    Physical Exam:  General appearance: awake, alert in NAD Respirations: unlabored; no dyspnea at rest Right upper  extremity: Hand is warm with 5/5 grip strength. Motor function and sensation intact. Good bruit and thrill in fistula.  The fistula is not easily palpable along its course.  Dialysis Duplex on 02/01/2021: +------------+----------+-------------+----------+---------------------+  OUTFLOW VEINPSV (cm/s)Diameter (cm)Depth (cm)      Describe         +------------+----------+-------------+----------+---------------------+  Mid UA          66        0.67        0.54                          +------------+----------+-------------+----------+---------------------+  Dist UA         56        0.47        0.28                          +------------+----------+-------------+----------+---------------------+  AC Fossa        48        0.62        0.26                          +------------+----------+-------------+----------+---------------------+  Prox Forearm    81        0.63        0.54                          +------------+----------+-------------+----------+---------------------+  Mid Forearm     72        0.56        0.53                          +------------+----------+-------------+----------+---------------------+  Dist Forearm   267        0.35        0.46   competing branch 0.25  +------------+----------+-------------+----------+---------------------+  Assessment/Plan:   I asked Dr. Oneida Alar to examine the patient.  He recommends fistulogram and explained that due to the fact he is nearing need for dialysis it would be reasonable to perform a fistulogram to further assess maturity.  The patient is agreement with this plan.  Barbie Banner, PA-C 04/04/2021 1:33 PM [facility] [phone]  Clinic MD:  Dr. Oneida Alar

## 2021-04-04 NOTE — Progress Notes (Signed)
POST OPERATIVE DIALYSIS ACCESS OFFICE NOTE    CC:  F/u for dialysis access surgery  HPI:  This is a 40 y.o. male who is s/p ight arm radial artery to cephalic vein av fistula creation by Dr. Donzetta Matters in 12/18/2020. He was alst seen on 02/01/2021 and noted to have good maturation and AVF would be ready for access, if necessary, on 03/19/2021. Dr. Justin Mend has asked [place] to re-assess capability to access his fistula. He is very close to needing dialysis per Dr. Jason Nest progress note dated 03/27/2021.  He has occasional tingling in his right hand; no pain. His wife accompanies him today.  Allergies  Allergen Reactions   Acyclovir And Related Other (See Comments)    Unknown reaction    Current Outpatient Medications  Medication Sig Dispense Refill   Accu-Chek FastClix Lancets MISC Check your blood sugars 4 times a day. 402 each 1   ACCU-CHEK GUIDE test strip USE TO CHECK BLOOD SUGAR 4 TIMES DAILY 375 strip 1   acetaminophen (TYLENOL) 325 MG tablet Take 650 mg by mouth every 6 (six) hours as needed for moderate pain.     amLODipine (NORVASC) 10 MG tablet Take 1 tablet (10 mg total) by mouth daily. 90 tablet 3   atorvastatin (LIPITOR) 80 MG tablet Take 1 tablet (80 mg total) by mouth daily. 90 tablet 3   blood glucose meter kit and supplies KIT Dispense based on patient and insurance preference. Use up to four times daily as directed. (FOR ICD-9 250.00, 250.01). 1 each 0   calcitRIOL (ROCALTROL) 0.25 MCG capsule Take 1 capsule (0.25 mcg total) by mouth daily. 30 capsule 6   Calcium Acetate 667 MG TABS Take 667 mg by mouth 3 (three) times daily with meals.     carvedilol (COREG) 25 MG tablet Take 1.5 tablets (37.5 mg total) by mouth 2 (two) times daily with a meal. 270 tablet 3   cloNIDine (CATAPRES) 0.3 MG tablet TAKE 1 TABLET (0.3 MG TOTAL) BY MOUTH TWICE A DAY 60 tablet 6   famotidine (PEPCID) 20 MG tablet Take 20 mg by mouth daily as needed for heartburn or indigestion.     glipiZIDE (GLUCOTROL) 5 MG  tablet Take 1 tablet (5 mg total) by mouth daily. 90 tablet 3   hydrALAZINE (APRESOLINE) 100 MG tablet Take 1 tablet (100 mg total) by mouth 3 (three) times daily. 90 tablet 2   insulin aspart (NOVOLOG) 100 UNIT/ML injection Inject 15 Units into the skin 3 (three) times daily with meals.     insulin glargine (LANTUS) 100 UNIT/ML injection Inject 35 Units into the skin at bedtime.     Insulin Syringe-Needle U-100 (ULTICARE INSULIN SYRINGE) 30G X 1/2" 0.5 ML MISC USE 4 TIMES DAILY WITH INSULIN INJECTIONS. 100 each 3   Insulin Syringe-Needle U-100 30G X 1/2" 0.5 ML MISC USE 4 TIMES DAILY WITH INSULIN INJECTIONS. 100 each 3   isosorbide mononitrate (IMDUR) 30 MG 24 hr tablet TAKE 1 TABLET BY MOUTH DAILY 30 tablet 6   isosorbide mononitrate (IMDUR) 30 MG 24 hr tablet Take 30 mg by mouth daily.     nitroGLYCERIN (NITROSTAT) 0.4 MG SL tablet PLACE 1 TABLET UNDER THE TONGUE EVERY 5 MINUTES AS NEEDED FOR CHEST PAIN. 25 tablet 3   ondansetron (ZOFRAN-ODT) 4 MG disintegrating tablet Dissolve 1 (one) Tablet by mouth every 8 hours as needed for nausea and vomiting 30 tablet 3   sodium bicarbonate 650 MG tablet Take 2 tablets (1,300 mg total) by mouth 3 (  three) times daily. 90 tablet 5   UNIFINE PENTIPS 32G X 4 MM MISC USE ONCE DAILY WITH VICTOZA 100 each 1   Vitamin D, Ergocalciferol, (DRISDOL) 1.25 MG (50000 UNIT) CAPS capsule TAKE 1 CAPSULE BY MOUTH TWICE WEEKLY 8 capsule 12   No current facility-administered medications for this visit.   Facility-Administered Medications Ordered in Other Visits  Medication Dose Route Frequency Provider Last Rate Last Admin   ceFAZolin (ANCEF) 3 g in dextrose 5 % 50 mL IVPB  3 g Intravenous 30 min Pre-Op Waynetta Sandy, MD         ROS:  See HPI  Vitals:   04/04/21 1336  BP: 117/71  Pulse: 83  Resp: 20  Temp: 98 F (36.7 C)  SpO2: 99%    Physical Exam:  General appearance: awake, alert in NAD Respirations: unlabored; no dyspnea at rest Right upper  extremity: Hand is warm with 5/5 grip strength. Motor function and sensation intact. Good bruit and thrill in fistula.  The fistula is not easily palpable along its course.  Dialysis Duplex on 02/01/2021: +------------+----------+-------------+----------+---------------------+  OUTFLOW VEINPSV (cm/s)Diameter (cm)Depth (cm)      Describe         +------------+----------+-------------+----------+---------------------+  Mid UA          66        0.67        0.54                          +------------+----------+-------------+----------+---------------------+  Dist UA         56        0.47        0.28                          +------------+----------+-------------+----------+---------------------+  AC Fossa        48        0.62        0.26                          +------------+----------+-------------+----------+---------------------+  Prox Forearm    81        0.63        0.54                          +------------+----------+-------------+----------+---------------------+  Mid Forearm     72        0.56        0.53                          +------------+----------+-------------+----------+---------------------+  Dist Forearm   267        0.35        0.46   competing branch 0.25  +------------+----------+-------------+----------+---------------------+  Assessment/Plan:   I asked Dr. Oneida Alar to examine the patient.  He recommends fistulogram and explained that due to the fact he is nearing need for dialysis it would be reasonable to perform a fistulogram to further assess maturity.  The patient is agreement with this plan.  Barbie Banner, PA-C 04/04/2021 1:33 PM [facility] [phone]  Clinic MD:  Dr. Oneida Alar

## 2021-04-08 ENCOUNTER — Other Ambulatory Visit: Payer: Self-pay | Admitting: Student

## 2021-04-08 ENCOUNTER — Ambulatory Visit (HOSPITAL_COMMUNITY): Admission: RE | Admit: 2021-04-08 | Payer: Medicaid Other | Source: Home / Self Care | Admitting: Vascular Surgery

## 2021-04-08 ENCOUNTER — Other Ambulatory Visit (HOSPITAL_COMMUNITY): Payer: Self-pay

## 2021-04-08 ENCOUNTER — Encounter (HOSPITAL_COMMUNITY): Admission: RE | Payer: Self-pay | Source: Home / Self Care

## 2021-04-08 SURGERY — A/V FISTULAGRAM
Anesthesia: LOCAL | Laterality: Right

## 2021-04-09 ENCOUNTER — Other Ambulatory Visit: Payer: Self-pay | Admitting: Student

## 2021-04-09 ENCOUNTER — Other Ambulatory Visit (HOSPITAL_COMMUNITY): Payer: Self-pay

## 2021-04-10 ENCOUNTER — Other Ambulatory Visit (HOSPITAL_COMMUNITY): Payer: Self-pay

## 2021-04-10 ENCOUNTER — Other Ambulatory Visit: Payer: Self-pay | Admitting: Student

## 2021-04-10 MED ORDER — INSULIN GLARGINE 100 UNIT/ML ~~LOC~~ SOLN
37.0000 [IU] | Freq: Every day | SUBCUTANEOUS | 3 refills | Status: DC
  Filled 2021-04-10: qty 10, 27d supply, fill #0
  Filled 2021-07-16: qty 10, 27d supply, fill #1

## 2021-04-10 MED ORDER — INSULIN ASPART 100 UNIT/ML IJ SOLN
15.0000 [IU] | Freq: Three times a day (TID) | INTRAMUSCULAR | 2 refills | Status: DC
  Filled 2021-04-10: qty 10, 22d supply, fill #0

## 2021-04-10 MED ORDER — CLONIDINE HCL 0.3 MG PO TABS
0.3000 mg | ORAL_TABLET | Freq: Two times a day (BID) | ORAL | 6 refills | Status: DC
  Filled 2021-04-10: qty 60, 30d supply, fill #0
  Filled 2021-05-10: qty 60, 30d supply, fill #1
  Filled 2021-06-07: qty 60, 30d supply, fill #2
  Filled 2021-07-05: qty 60, 30d supply, fill #3
  Filled 2021-08-02: qty 60, 30d supply, fill #4

## 2021-04-10 MED FILL — Insulin Syringe/Needle U-100 1/2 ML 30 x 1/2": 25 days supply | Qty: 100 | Fill #1 | Status: AC

## 2021-04-15 ENCOUNTER — Other Ambulatory Visit (HOSPITAL_COMMUNITY): Payer: Self-pay

## 2021-04-15 MED FILL — Glipizide Tab 5 MG: ORAL | 90 days supply | Qty: 90 | Fill #1 | Status: AC

## 2021-04-19 ENCOUNTER — Encounter (HOSPITAL_COMMUNITY): Payer: Self-pay | Admitting: *Deleted

## 2021-04-19 ENCOUNTER — Other Ambulatory Visit (HOSPITAL_COMMUNITY): Payer: Self-pay

## 2021-04-19 ENCOUNTER — Ambulatory Visit (HOSPITAL_COMMUNITY): Admission: RE | Admit: 2021-04-19 | Payer: Medicaid Other | Source: Ambulatory Visit

## 2021-04-19 MED FILL — Isosorbide Mononitrate Tab ER 24HR 30 MG: ORAL | 30 days supply | Qty: 30 | Fill #2 | Status: AC

## 2021-04-22 ENCOUNTER — Other Ambulatory Visit: Payer: Self-pay

## 2021-04-22 ENCOUNTER — Encounter (HOSPITAL_COMMUNITY)
Admission: RE | Disposition: A | Discharge status: Home / Self Care | Payer: Self-pay | Source: Home / Self Care | Attending: Vascular Surgery

## 2021-04-22 ENCOUNTER — Ambulatory Visit (HOSPITAL_COMMUNITY)
Admission: RE | Admit: 2021-04-22 | Discharge: 2021-04-22 | Disposition: A | Discharge status: Home / Self Care | Payer: Medicaid Other | Attending: Vascular Surgery | Admitting: Vascular Surgery

## 2021-04-22 DIAGNOSIS — Z794 Long term (current) use of insulin: Secondary | ICD-10-CM | POA: Diagnosis not present

## 2021-04-22 DIAGNOSIS — Z539 Procedure and treatment not carried out, unspecified reason: Secondary | ICD-10-CM | POA: Insufficient documentation

## 2021-04-22 DIAGNOSIS — Z7984 Long term (current) use of oral hypoglycemic drugs: Secondary | ICD-10-CM | POA: Insufficient documentation

## 2021-04-22 DIAGNOSIS — N189 Chronic kidney disease, unspecified: Secondary | ICD-10-CM | POA: Diagnosis not present

## 2021-04-22 DIAGNOSIS — Z888 Allergy status to other drugs, medicaments and biological substances status: Secondary | ICD-10-CM | POA: Insufficient documentation

## 2021-04-22 DIAGNOSIS — Z79899 Other long term (current) drug therapy: Secondary | ICD-10-CM | POA: Insufficient documentation

## 2021-04-22 LAB — POCT I-STAT, CHEM 8
BUN: 107 mg/dL — ABNORMAL HIGH (ref 6–20)
Calcium, Ion: 1.13 mmol/L — ABNORMAL LOW (ref 1.15–1.40)
Chloride: 108 mmol/L (ref 98–111)
Creatinine, Ser: 10.7 mg/dL — ABNORMAL HIGH (ref 0.61–1.24)
Glucose, Bld: 165 mg/dL — ABNORMAL HIGH (ref 70–99)
HCT: 35 % — ABNORMAL LOW (ref 39.0–52.0)
Hemoglobin: 11.9 g/dL — ABNORMAL LOW (ref 13.0–17.0)
Potassium: 4.9 mmol/L (ref 3.5–5.1)
Sodium: 143 mmol/L (ref 135–145)
TCO2: 27 mmol/L (ref 22–32)

## 2021-04-22 SURGERY — A/V FISTULAGRAM
Anesthesia: LOCAL

## 2021-04-22 MED ORDER — SODIUM CHLORIDE 0.9 % IV SOLN: INTRAVENOUS | Status: DC

## 2021-04-22 NOTE — Interval H&P Note (Signed)
History and Physical Interval Note:  04/22/2021 8:22 AM  Jeremy [name]  has presented today for surgery, with the diagnosis of renal disease.  The various methods of treatment have been discussed with the patient and family. After consideration of risks, benefits and other options for treatment, the patient has consented to  Procedure(s): A/V FISTULAGRAM (N/A) as a surgical intervention.  The patient's history has been reviewed, patient examined, no change in status, stable for surgery.  I have reviewed the patient's chart and labs.  Questions were answered to the patient's satisfaction.     Servando Snare

## 2021-04-29 ENCOUNTER — Encounter (HOSPITAL_COMMUNITY): Admission: RE | Payer: Self-pay | Source: Home / Self Care

## 2021-04-29 ENCOUNTER — Ambulatory Visit (HOSPITAL_COMMUNITY): Admission: RE | Admit: 2021-04-29 | Payer: Medicaid Other | Source: Home / Self Care | Admitting: Vascular Surgery

## 2021-04-29 ENCOUNTER — Encounter (HOSPITAL_COMMUNITY): Payer: Self-pay | Admitting: *Deleted

## 2021-04-29 ENCOUNTER — Other Ambulatory Visit (HOSPITAL_COMMUNITY): Payer: Self-pay

## 2021-04-29 SURGERY — A/V FISTULAGRAM
Anesthesia: LOCAL

## 2021-04-29 MED FILL — Ergocalciferol Cap 1.25 MG (50000 Unit): ORAL | 28 days supply | Qty: 8 | Fill #4 | Status: AC

## 2021-04-30 ENCOUNTER — Ambulatory Visit (HOSPITAL_COMMUNITY): Admission: RE | Admit: 2021-04-30 | Payer: Medicaid Other | Source: Ambulatory Visit

## 2021-05-02 ENCOUNTER — Other Ambulatory Visit: Payer: Self-pay

## 2021-05-02 ENCOUNTER — Other Ambulatory Visit (HOSPITAL_COMMUNITY): Payer: Self-pay

## 2021-05-02 MED ORDER — CALCITRIOL 0.25 MCG PO CAPS
0.2500 ug | ORAL_CAPSULE | Freq: Every day | ORAL | 6 refills | Status: DC
  Filled 2021-05-02 – 2021-05-10 (×2): qty 30, 30d supply, fill #0

## 2021-05-07 ENCOUNTER — Other Ambulatory Visit (HOSPITAL_COMMUNITY): Payer: Self-pay | Admitting: Cardiology

## 2021-05-07 ENCOUNTER — Other Ambulatory Visit: Payer: Self-pay | Admitting: Student

## 2021-05-07 ENCOUNTER — Encounter (HOSPITAL_COMMUNITY): Payer: Self-pay | Admitting: *Deleted

## 2021-05-07 ENCOUNTER — Other Ambulatory Visit (HOSPITAL_COMMUNITY): Payer: Self-pay

## 2021-05-08 ENCOUNTER — Other Ambulatory Visit: Payer: Self-pay | Admitting: Student

## 2021-05-08 ENCOUNTER — Other Ambulatory Visit (HOSPITAL_COMMUNITY): Payer: Self-pay

## 2021-05-08 ENCOUNTER — Other Ambulatory Visit (HOSPITAL_COMMUNITY): Payer: Self-pay | Admitting: Cardiology

## 2021-05-09 ENCOUNTER — Other Ambulatory Visit (HOSPITAL_COMMUNITY): Payer: Self-pay

## 2021-05-09 ENCOUNTER — Other Ambulatory Visit: Payer: Self-pay

## 2021-05-09 ENCOUNTER — Other Ambulatory Visit: Payer: Self-pay | Admitting: Student

## 2021-05-09 ENCOUNTER — Other Ambulatory Visit (HOSPITAL_COMMUNITY): Payer: Self-pay | Admitting: Cardiology

## 2021-05-09 ENCOUNTER — Encounter: Payer: Self-pay | Admitting: Student

## 2021-05-09 DIAGNOSIS — E7849 Other hyperlipidemia: Secondary | ICD-10-CM

## 2021-05-09 MED ORDER — ATORVASTATIN CALCIUM 80 MG PO TABS
80.0000 mg | ORAL_TABLET | Freq: Every day | ORAL | 3 refills | Status: DC
  Filled 2021-05-09: qty 90, 90d supply, fill #0
  Filled 2021-08-09: qty 90, 90d supply, fill #1
  Filled 2021-11-08: qty 90, 90d supply, fill #2
  Filled 2022-02-06: qty 90, 90d supply, fill #3

## 2021-05-09 MED ORDER — HYDRALAZINE HCL 100 MG PO TABS
100.0000 mg | ORAL_TABLET | Freq: Three times a day (TID) | ORAL | 2 refills | Status: DC
  Filled 2021-05-09: qty 90, 30d supply, fill #0
  Filled 2021-06-28: qty 90, 30d supply, fill #1
  Filled 2021-08-06: qty 90, 30d supply, fill #2

## 2021-05-10 ENCOUNTER — Other Ambulatory Visit: Payer: Self-pay

## 2021-05-10 ENCOUNTER — Other Ambulatory Visit (HOSPITAL_COMMUNITY): Payer: Self-pay

## 2021-05-14 ENCOUNTER — Other Ambulatory Visit (HOSPITAL_COMMUNITY): Payer: Self-pay

## 2021-05-14 DIAGNOSIS — D631 Anemia in chronic kidney disease: Secondary | ICD-10-CM | POA: Diagnosis not present

## 2021-05-14 DIAGNOSIS — N2581 Secondary hyperparathyroidism of renal origin: Secondary | ICD-10-CM | POA: Diagnosis not present

## 2021-05-14 DIAGNOSIS — E1129 Type 2 diabetes mellitus with other diabetic kidney complication: Secondary | ICD-10-CM | POA: Diagnosis not present

## 2021-05-14 DIAGNOSIS — I120 Hypertensive chronic kidney disease with stage 5 chronic kidney disease or end stage renal disease: Secondary | ICD-10-CM | POA: Diagnosis not present

## 2021-05-14 DIAGNOSIS — N189 Chronic kidney disease, unspecified: Secondary | ICD-10-CM | POA: Diagnosis not present

## 2021-05-14 DIAGNOSIS — N185 Chronic kidney disease, stage 5: Secondary | ICD-10-CM | POA: Diagnosis not present

## 2021-05-14 MED ORDER — CEPHALEXIN 500 MG PO CAPS
500.0000 mg | ORAL_CAPSULE | Freq: Two times a day (BID) | ORAL | 0 refills | Status: DC
  Filled 2021-05-14: qty 14, 7d supply, fill #0

## 2021-05-15 ENCOUNTER — Other Ambulatory Visit (HOSPITAL_COMMUNITY): Payer: Self-pay

## 2021-05-16 ENCOUNTER — Ambulatory Visit (HOSPITAL_COMMUNITY): Payer: Medicaid Other | Attending: Cardiology

## 2021-05-16 ENCOUNTER — Other Ambulatory Visit (HOSPITAL_COMMUNITY): Payer: Self-pay

## 2021-05-16 ENCOUNTER — Encounter (HOSPITAL_COMMUNITY): Payer: Self-pay

## 2021-05-20 ENCOUNTER — Encounter (HOSPITAL_COMMUNITY): Admission: RE | Payer: Self-pay | Source: Home / Self Care

## 2021-05-20 ENCOUNTER — Ambulatory Visit (HOSPITAL_COMMUNITY): Admission: RE | Admit: 2021-05-20 | Payer: Medicaid Other | Source: Home / Self Care | Admitting: Vascular Surgery

## 2021-05-20 DIAGNOSIS — Z992 Dependence on renal dialysis: Secondary | ICD-10-CM | POA: Diagnosis not present

## 2021-05-20 DIAGNOSIS — N186 End stage renal disease: Secondary | ICD-10-CM | POA: Diagnosis not present

## 2021-05-20 SURGERY — A/V FISTULAGRAM
Anesthesia: LOCAL | Laterality: Right

## 2021-05-22 ENCOUNTER — Other Ambulatory Visit: Payer: Self-pay

## 2021-05-23 ENCOUNTER — Other Ambulatory Visit (HOSPITAL_COMMUNITY): Payer: Self-pay

## 2021-05-23 ENCOUNTER — Encounter (HOSPITAL_COMMUNITY): Payer: Self-pay | Admitting: *Deleted

## 2021-05-24 ENCOUNTER — Other Ambulatory Visit (HOSPITAL_COMMUNITY): Payer: Self-pay

## 2021-05-24 DIAGNOSIS — Z992 Dependence on renal dialysis: Secondary | ICD-10-CM | POA: Diagnosis not present

## 2021-05-24 DIAGNOSIS — D689 Coagulation defect, unspecified: Secondary | ICD-10-CM | POA: Diagnosis not present

## 2021-05-24 DIAGNOSIS — N186 End stage renal disease: Secondary | ICD-10-CM | POA: Diagnosis not present

## 2021-05-24 DIAGNOSIS — N2581 Secondary hyperparathyroidism of renal origin: Secondary | ICD-10-CM | POA: Diagnosis not present

## 2021-05-25 ENCOUNTER — Encounter: Payer: Self-pay | Admitting: Student

## 2021-05-25 ENCOUNTER — Other Ambulatory Visit: Payer: Self-pay | Admitting: Student

## 2021-05-25 MED ORDER — INSULIN ASPART 100 UNIT/ML IJ SOLN
15.0000 [IU] | Freq: Three times a day (TID) | INTRAMUSCULAR | 2 refills | Status: DC
  Filled 2021-05-25: qty 10, 22d supply, fill #0

## 2021-05-27 ENCOUNTER — Other Ambulatory Visit (HOSPITAL_COMMUNITY): Payer: Self-pay

## 2021-05-27 ENCOUNTER — Encounter: Payer: Self-pay | Admitting: Student

## 2021-05-27 DIAGNOSIS — Z992 Dependence on renal dialysis: Secondary | ICD-10-CM | POA: Diagnosis not present

## 2021-05-27 DIAGNOSIS — N2581 Secondary hyperparathyroidism of renal origin: Secondary | ICD-10-CM | POA: Diagnosis not present

## 2021-05-27 DIAGNOSIS — N186 End stage renal disease: Secondary | ICD-10-CM | POA: Diagnosis not present

## 2021-05-27 DIAGNOSIS — D689 Coagulation defect, unspecified: Secondary | ICD-10-CM | POA: Diagnosis not present

## 2021-05-27 MED ORDER — INSULIN ASPART 100 UNIT/ML IJ SOLN
15.0000 [IU] | Freq: Three times a day (TID) | INTRAMUSCULAR | 2 refills | Status: DC
Start: 2021-05-27 — End: 2021-10-07
  Filled 2021-05-27: qty 10, 22d supply, fill #0
  Filled 2021-07-16: qty 10, 22d supply, fill #1
  Filled 2021-09-11: qty 10, 22d supply, fill #2

## 2021-05-27 NOTE — Telephone Encounter (Signed)
Request being handled in another encounter.[name]9/19/20221:14 PM

## 2021-05-27 NOTE — Telephone Encounter (Signed)
Pt has novolog refill in file, but medicaid unable to recognize resident's Jeremy [name]) DEA . Will send to attending to assist with resending refill.[name]9/19/20221:11 PM

## 2021-05-28 ENCOUNTER — Ambulatory Visit (HOSPITAL_COMMUNITY): Admission: RE | Admit: 2021-05-28 | Payer: Medicaid Other | Source: Home / Self Care | Admitting: Surgery

## 2021-05-28 ENCOUNTER — Encounter (HOSPITAL_COMMUNITY): Admission: RE | Payer: Self-pay | Source: Home / Self Care

## 2021-05-28 SURGERY — A/V FISTULAGRAM
Anesthesia: LOCAL | Laterality: Right

## 2021-05-29 ENCOUNTER — Other Ambulatory Visit (HOSPITAL_COMMUNITY): Payer: Self-pay

## 2021-05-29 DIAGNOSIS — D689 Coagulation defect, unspecified: Secondary | ICD-10-CM | POA: Diagnosis not present

## 2021-05-29 DIAGNOSIS — N2581 Secondary hyperparathyroidism of renal origin: Secondary | ICD-10-CM | POA: Diagnosis not present

## 2021-05-29 DIAGNOSIS — Z992 Dependence on renal dialysis: Secondary | ICD-10-CM | POA: Diagnosis not present

## 2021-05-29 DIAGNOSIS — N186 End stage renal disease: Secondary | ICD-10-CM | POA: Diagnosis not present

## 2021-05-31 DIAGNOSIS — D689 Coagulation defect, unspecified: Secondary | ICD-10-CM | POA: Diagnosis not present

## 2021-05-31 DIAGNOSIS — N2581 Secondary hyperparathyroidism of renal origin: Secondary | ICD-10-CM | POA: Diagnosis not present

## 2021-05-31 DIAGNOSIS — Z992 Dependence on renal dialysis: Secondary | ICD-10-CM | POA: Diagnosis not present

## 2021-05-31 DIAGNOSIS — N186 End stage renal disease: Secondary | ICD-10-CM | POA: Diagnosis not present

## 2021-06-03 ENCOUNTER — Telehealth: Payer: Self-pay

## 2021-06-03 NOTE — Telephone Encounter (Signed)
Contacted Jeremy [name] to follow up scheduling right arm fistulogram that has been rescheduled multiple times. Jeremy [name] stated he is interested in scheduling procedure with Dr. Donzetta Matters and is aware that he would have to rearrange dialysis days, but will have to call office back to schedule.

## 2021-06-05 DIAGNOSIS — Z992 Dependence on renal dialysis: Secondary | ICD-10-CM | POA: Diagnosis not present

## 2021-06-05 DIAGNOSIS — N2581 Secondary hyperparathyroidism of renal origin: Secondary | ICD-10-CM | POA: Diagnosis not present

## 2021-06-05 DIAGNOSIS — D631 Anemia in chronic kidney disease: Secondary | ICD-10-CM | POA: Diagnosis not present

## 2021-06-05 DIAGNOSIS — D689 Coagulation defect, unspecified: Secondary | ICD-10-CM | POA: Diagnosis not present

## 2021-06-05 DIAGNOSIS — N186 End stage renal disease: Secondary | ICD-10-CM | POA: Diagnosis not present

## 2021-06-07 ENCOUNTER — Other Ambulatory Visit (HOSPITAL_COMMUNITY): Payer: Self-pay

## 2021-06-07 DIAGNOSIS — Z992 Dependence on renal dialysis: Secondary | ICD-10-CM | POA: Diagnosis not present

## 2021-06-07 DIAGNOSIS — N186 End stage renal disease: Secondary | ICD-10-CM | POA: Diagnosis not present

## 2021-06-07 DIAGNOSIS — E1129 Type 2 diabetes mellitus with other diabetic kidney complication: Secondary | ICD-10-CM | POA: Diagnosis not present

## 2021-06-10 DIAGNOSIS — N2581 Secondary hyperparathyroidism of renal origin: Secondary | ICD-10-CM | POA: Diagnosis not present

## 2021-06-10 DIAGNOSIS — D689 Coagulation defect, unspecified: Secondary | ICD-10-CM | POA: Diagnosis not present

## 2021-06-10 DIAGNOSIS — N186 End stage renal disease: Secondary | ICD-10-CM | POA: Diagnosis not present

## 2021-06-10 DIAGNOSIS — Z992 Dependence on renal dialysis: Secondary | ICD-10-CM | POA: Diagnosis not present

## 2021-06-14 DIAGNOSIS — Z992 Dependence on renal dialysis: Secondary | ICD-10-CM | POA: Diagnosis not present

## 2021-06-14 DIAGNOSIS — N186 End stage renal disease: Secondary | ICD-10-CM | POA: Diagnosis not present

## 2021-06-14 DIAGNOSIS — N2581 Secondary hyperparathyroidism of renal origin: Secondary | ICD-10-CM | POA: Diagnosis not present

## 2021-06-14 DIAGNOSIS — D689 Coagulation defect, unspecified: Secondary | ICD-10-CM | POA: Diagnosis not present

## 2021-06-17 DIAGNOSIS — N2581 Secondary hyperparathyroidism of renal origin: Secondary | ICD-10-CM | POA: Diagnosis not present

## 2021-06-17 DIAGNOSIS — N186 End stage renal disease: Secondary | ICD-10-CM | POA: Diagnosis not present

## 2021-06-17 DIAGNOSIS — D689 Coagulation defect, unspecified: Secondary | ICD-10-CM | POA: Diagnosis not present

## 2021-06-17 DIAGNOSIS — D631 Anemia in chronic kidney disease: Secondary | ICD-10-CM | POA: Diagnosis not present

## 2021-06-17 DIAGNOSIS — Z992 Dependence on renal dialysis: Secondary | ICD-10-CM | POA: Diagnosis not present

## 2021-06-21 DIAGNOSIS — D631 Anemia in chronic kidney disease: Secondary | ICD-10-CM | POA: Diagnosis not present

## 2021-06-21 DIAGNOSIS — D689 Coagulation defect, unspecified: Secondary | ICD-10-CM | POA: Diagnosis not present

## 2021-06-21 DIAGNOSIS — N2581 Secondary hyperparathyroidism of renal origin: Secondary | ICD-10-CM | POA: Diagnosis not present

## 2021-06-21 DIAGNOSIS — N186 End stage renal disease: Secondary | ICD-10-CM | POA: Diagnosis not present

## 2021-06-21 DIAGNOSIS — Z992 Dependence on renal dialysis: Secondary | ICD-10-CM | POA: Diagnosis not present

## 2021-06-24 DIAGNOSIS — D689 Coagulation defect, unspecified: Secondary | ICD-10-CM | POA: Diagnosis not present

## 2021-06-24 DIAGNOSIS — N186 End stage renal disease: Secondary | ICD-10-CM | POA: Diagnosis not present

## 2021-06-24 DIAGNOSIS — N2581 Secondary hyperparathyroidism of renal origin: Secondary | ICD-10-CM | POA: Diagnosis not present

## 2021-06-24 DIAGNOSIS — Z992 Dependence on renal dialysis: Secondary | ICD-10-CM | POA: Diagnosis not present

## 2021-06-28 ENCOUNTER — Other Ambulatory Visit (HOSPITAL_COMMUNITY): Payer: Self-pay

## 2021-06-28 DIAGNOSIS — Z992 Dependence on renal dialysis: Secondary | ICD-10-CM | POA: Diagnosis not present

## 2021-06-28 DIAGNOSIS — D689 Coagulation defect, unspecified: Secondary | ICD-10-CM | POA: Diagnosis not present

## 2021-06-28 DIAGNOSIS — N186 End stage renal disease: Secondary | ICD-10-CM | POA: Diagnosis not present

## 2021-06-28 DIAGNOSIS — N2581 Secondary hyperparathyroidism of renal origin: Secondary | ICD-10-CM | POA: Diagnosis not present

## 2021-06-28 MED FILL — Insulin Syringe/Needle U-100 1/2 ML 30 x 1/2": 25 days supply | Qty: 100 | Fill #2 | Status: AC

## 2021-07-02 ENCOUNTER — Emergency Department (HOSPITAL_COMMUNITY): Payer: Medicaid Other

## 2021-07-02 ENCOUNTER — Emergency Department (HOSPITAL_COMMUNITY)
Admission: EM | Admit: 2021-07-02 | Discharge: 2021-07-02 | Disposition: A | Discharge status: Home / Self Care | Payer: Medicaid Other | Attending: Student | Admitting: Student

## 2021-07-02 ENCOUNTER — Encounter (HOSPITAL_COMMUNITY): Payer: Self-pay | Admitting: Emergency Medicine

## 2021-07-02 DIAGNOSIS — N185 Chronic kidney disease, stage 5: Secondary | ICD-10-CM | POA: Diagnosis not present

## 2021-07-02 DIAGNOSIS — M545 Low back pain, unspecified: Secondary | ICD-10-CM | POA: Diagnosis not present

## 2021-07-02 DIAGNOSIS — Z8616 Personal history of COVID-19: Secondary | ICD-10-CM | POA: Diagnosis not present

## 2021-07-02 DIAGNOSIS — Z20822 Contact with and (suspected) exposure to covid-19: Secondary | ICD-10-CM | POA: Insufficient documentation

## 2021-07-02 DIAGNOSIS — R112 Nausea with vomiting, unspecified: Secondary | ICD-10-CM | POA: Insufficient documentation

## 2021-07-02 DIAGNOSIS — R509 Fever, unspecified: Secondary | ICD-10-CM | POA: Diagnosis not present

## 2021-07-02 DIAGNOSIS — I5042 Chronic combined systolic (congestive) and diastolic (congestive) heart failure: Secondary | ICD-10-CM | POA: Diagnosis not present

## 2021-07-02 DIAGNOSIS — E1122 Type 2 diabetes mellitus with diabetic chronic kidney disease: Secondary | ICD-10-CM | POA: Insufficient documentation

## 2021-07-02 DIAGNOSIS — R110 Nausea: Secondary | ICD-10-CM | POA: Diagnosis not present

## 2021-07-02 DIAGNOSIS — R000 Tachycardia, unspecified: Secondary | ICD-10-CM | POA: Insufficient documentation

## 2021-07-02 DIAGNOSIS — Z7984 Long term (current) use of oral hypoglycemic drugs: Secondary | ICD-10-CM | POA: Insufficient documentation

## 2021-07-02 DIAGNOSIS — Z992 Dependence on renal dialysis: Secondary | ICD-10-CM | POA: Diagnosis not present

## 2021-07-02 DIAGNOSIS — Z794 Long term (current) use of insulin: Secondary | ICD-10-CM | POA: Insufficient documentation

## 2021-07-02 DIAGNOSIS — G4489 Other headache syndrome: Secondary | ICD-10-CM | POA: Diagnosis not present

## 2021-07-02 DIAGNOSIS — I132 Hypertensive heart and chronic kidney disease with heart failure and with stage 5 chronic kidney disease, or end stage renal disease: Secondary | ICD-10-CM | POA: Insufficient documentation

## 2021-07-02 DIAGNOSIS — J45909 Unspecified asthma, uncomplicated: Secondary | ICD-10-CM | POA: Diagnosis not present

## 2021-07-02 DIAGNOSIS — Z79899 Other long term (current) drug therapy: Secondary | ICD-10-CM | POA: Insufficient documentation

## 2021-07-02 DIAGNOSIS — I10 Essential (primary) hypertension: Secondary | ICD-10-CM | POA: Diagnosis not present

## 2021-07-02 DIAGNOSIS — R0602 Shortness of breath: Secondary | ICD-10-CM | POA: Insufficient documentation

## 2021-07-02 DIAGNOSIS — R0689 Other abnormalities of breathing: Secondary | ICD-10-CM | POA: Diagnosis not present

## 2021-07-02 LAB — COMPREHENSIVE METABOLIC PANEL
ALT: 24 U/L (ref 0–44)
AST: 20 U/L (ref 15–41)
Albumin: 3.5 g/dL (ref 3.5–5.0)
Alkaline Phosphatase: 54 U/L (ref 38–126)
Anion gap: 14 (ref 5–15)
BUN: 60 mg/dL — ABNORMAL HIGH (ref 6–20)
CO2: 19 mmol/L — ABNORMAL LOW (ref 22–32)
Calcium: 9 mg/dL (ref 8.9–10.3)
Chloride: 106 mmol/L (ref 98–111)
Creatinine, Ser: 9.06 mg/dL — ABNORMAL HIGH (ref 0.61–1.24)
GFR, Estimated: 7 mL/min — ABNORMAL LOW (ref >60)
Glucose, Bld: 101 mg/dL — ABNORMAL HIGH (ref 70–99)
Potassium: 4.3 mmol/L (ref 3.5–5.1)
Sodium: 139 mmol/L (ref 135–145)
Total Bilirubin: 0.7 mg/dL (ref 0.3–1.2)
Total Protein: 8 g/dL (ref 6.5–8.1)

## 2021-07-02 LAB — CBC
HCT: 40 % (ref 39.0–52.0)
Hemoglobin: 12.2 g/dL — ABNORMAL LOW (ref 13.0–17.0)
MCH: 26 pg (ref 26.0–34.0)
MCHC: 30.5 g/dL (ref 30.0–36.0)
MCV: 85.3 fL (ref 80.0–100.0)
Platelets: 233 10*3/uL (ref 150–400)
RBC: 4.69 MIL/uL (ref 4.22–5.81)
RDW: 16.3 % — ABNORMAL HIGH (ref 11.5–15.5)
WBC: 11.1 10*3/uL — ABNORMAL HIGH (ref 4.0–10.5)
nRBC: 0 % (ref 0.0–0.2)

## 2021-07-02 LAB — LIPASE, BLOOD: Lipase: 87 U/L — ABNORMAL HIGH (ref 11–51)

## 2021-07-02 LAB — TROPONIN I (HIGH SENSITIVITY)
Troponin I (High Sensitivity): 29 ng/L — ABNORMAL HIGH (ref <18)
Troponin I (High Sensitivity): 25 ng/L — ABNORMAL HIGH (ref <18)

## 2021-07-02 LAB — RESP PANEL BY RT-PCR (FLU A&B, COVID) ARPGX2
Influenza A by PCR: NEGATIVE
Influenza B by PCR: NEGATIVE
SARS Coronavirus 2 by RT PCR: NEGATIVE

## 2021-07-02 MED ORDER — FENTANYL CITRATE PF 50 MCG/ML IJ SOSY
50.0000 ug | PREFILLED_SYRINGE | Freq: Once | INTRAMUSCULAR | Status: AC
  Administered 2021-07-02: 50 ug via INTRAVENOUS
  Filled 2021-07-02: qty 1

## 2021-07-02 MED ORDER — MORPHINE SULFATE (PF) 4 MG/ML IV SOLN
4.0000 mg | Freq: Once | INTRAVENOUS | Status: AC
Start: 2021-07-02 — End: 2021-07-02
  Administered 2021-07-02: 4 mg via INTRAVENOUS
  Filled 2021-07-02: qty 1

## 2021-07-02 MED ORDER — ONDANSETRON 4 MG PO TBDP
4.0000 mg | ORAL_TABLET | Freq: Three times a day (TID) | ORAL | 0 refills | Status: DC | PRN
  Filled 2021-07-03 – 2022-03-06 (×2): qty 20, 7d supply, fill #0

## 2021-07-02 MED ORDER — ONDANSETRON HCL 4 MG/2ML IJ SOLN
4.0000 mg | Freq: Once | INTRAMUSCULAR | Status: AC
  Administered 2021-07-02: 4 mg via INTRAVENOUS
  Filled 2021-07-02: qty 2

## 2021-07-02 MED ORDER — OXYCODONE-ACETAMINOPHEN 5-325 MG PO TABS: 1.0000 | ORAL_TABLET | Freq: Four times a day (QID) | ORAL | 0 refills | Status: DC | PRN

## 2021-07-02 MED ORDER — MORPHINE SULFATE (PF) 4 MG/ML IV SOLN
4.0000 mg | Freq: Once | INTRAVENOUS | Status: AC
  Administered 2021-07-02: 4 mg via INTRAVENOUS
  Filled 2021-07-02: qty 1

## 2021-07-02 NOTE — Discharge Instructions (Addendum)
Your work-up in the emergency department today did not reveal any emergent findings.   Unfortunately, given you our new dialysis patient, I am unable to get a CT scan with contrast to further evaluate your back pain.  As we discussed, given that you have been here for over 10 hours without any acute changes, I feel that you are stable for discharge with close follow-up.  Please see your PCP in the next few days for further evaluation.  Additionally if any development of new or worsening symptoms in the next 24 to 48 hours she will need to return.  Also, please attend your dialysis session tomorrow and try to avoid any further missed sessions in the future as this is a lifesaving intervention and missed sessions can result in significant consequences to your health.

## 2021-07-02 NOTE — ED Triage Notes (Signed)
Patient arrived by [facility] from home with complaint of nausea and vomiting this am with chills. Patient started dialysis 2 weeks ago and missed scheduled appointment yesterday. EMS reports BP 240s with CBG 230. Alert and oriented

## 2021-07-02 NOTE — ED Notes (Signed)
Pt has only been on dialysis for 3 weeks - per CT tech pt can not get contrast at this time. MD and PA notified

## 2021-07-02 NOTE — ED Provider Notes (Signed)
Emergency Medicine Provider Triage Evaluation Note  Jeremy [name] , a 40 y.o. male  was evaluated in triage.  Pt complains of nausea, vomiting, and chills starting this morning.  Patient started dialysis 2 weeks ago, missed his scheduled appointment yesterday.  With EMS he had a systolic blood pressure in the 240s, with a blood sugar of 230.  States he is having pain in his abdomen, worse with a bowel movement.  Review of Systems  Positive: Abdominal pain, headache, lightheadedness, nausea, vomiting Negative: Chest pain, shortness of breath, diarrhea  Physical Exam  BP (!) 219/139 (BP Location: Left Wrist)   Pulse (!) 110   Temp 99.6 F (37.6 C) (Oral)   SpO2 100%  Gen:   Awake, no distress   Resp:  Normal effort  MSK:   Moves extremities without difficulty  Other:  Abdominal distention with generalized tenderness, no guarding or rigidity  Medical Decision Making  Medically screening exam initiated at 11:21 AM.  Appropriate orders placed.  Armen Waring was informed that the remainder of the evaluation will be completed by another provider, this initial triage assessment does not replace that evaluation, and the importance of remaining in the ED until their evaluation is complete.     Kateri Plummer, PA-C 07/02/21 MartelleQuita Skye, DO 07/02/21 1326

## 2021-07-02 NOTE — ED Notes (Signed)
Patient verbalizes understanding of discharge instructions. Opportunity for questioning and answers were provided. Armband removed by staff, pt discharged from ED via wheelchair.  

## 2021-07-02 NOTE — ED Provider Notes (Signed)
Patient received from [facility], PA-C at shift change. See their note for further information.  Briefly: New dialysis patient with CHF presented with n/v, chills, chest pain, and back pain this morning. Missed dialysis Monday. Labs and imaging unremarkable.  Plan: COVID and Trop pending, if troponin negative, he can go.  Initial Trop 29, will wait for second  Second troponin 25, COVID and flu negative.  Discussed findings with patient and his wife, they are concerned about continuing chest and back pain that he states is still 10/10 in nature requiring IV narcotics.  Given symptoms with blood pressures that continue to be in the 903E systolic, some concern for AAA, will get dissection studies.   Unfortunately, given that patient has only been on 3 weeks of dialysis, he does not qualify for CT scans with contrast.  Discussed this with the patient, admission for observation and continued pain management versus home with close follow-up.  Patient states that his pain is somewhat reduced and he would like to go home.  Patient has been here for over 10 hours without changes in status, he is afebrile, nontoxic-appearing, and in minimal distress lying in bed speaking in complete sentences.  He has dialysis scheduled in the morning and lives at home with his wife.  Feel that patient can be amenable for discharge at this time.  Educated on red flag symptoms that would prompt immediate return.  Advised close PCP follow-up as well.  Emphasized importance of going to dialysis tomorrow and not missing any further appointments.  Patient amenable with plan, discharged in stable condition.   Jeremy [name] 07/02/21 2110    Wyvonnia Dusky, MD 07/03/21 [phone]

## 2021-07-02 NOTE — ED Provider Notes (Signed)
[facility] EMERGENCY DEPARTMENT Provider Note   CSN: [id number] Arrival date & time: 07/02/21  1045     History Chief Complaint  Patient presents with   Emesis    Jeremy [name] is a 40 y.o. 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  Patient began hemodialysis 3 weeks ago, and is scheduled on MWF.  Patient missed his session yesterday.  He states that nobody around him has had similar symptoms.  He denies missing any doses of his hypertension medications.  No urinary symptoms, or changes in BMs. Reports he has had headaches since this morning however none prior.  Patient has no history of ACS.  Never measured a fever.  He and his wife say he decided to come to the emergency department after they saw his elevated blood pressure and could not get his chills to stop.  No hematochezia noted by patient, his wife or EMS.    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 01/2015 Lexiscan MV: EF 28%, inferior, inferolateral, apical ischemia;  b. 01/2015 Cath: nl cors, PCWP 18 mmHg, CO 9.38 L/min, CI 3.53 L/min/m^2.   CHF (congestive heart failure) (HCC)    Depression    Situational   Essential hypertension    Family history of adverse reaction to anesthesia    sister- "it was too much for her heart"   GERD (gastroesophageal reflux disease)    Hyperlipidemia    Membranous glomerulonephritis    Morbid obesity ([place])    Nonischemic cardiomyopathy ([place])    a. 01/2015 Echo: EF 20-25%, diff HK, Gr 2 DD, Triv AI, mildly dil LA and Ao root.   Seizures ([place]) 02/29/2020   Type II diabetes mellitus ([place])    a. 01/2015 HbA1c = 8.9.    Patient Active Problem List    Diagnosis Date Noted   Unspecified protein-calorie malnutrition ([place]) 10/27/2019   Anemia, unspecified 10/26/2019   Acute kidney failure, unspecified ([place]) 10/21/2019   Coagulation defect, unspecified ([place]) 10/21/2019   Personal history of COVID-19 10/21/2019   Secondary hyperparathyroidism of renal origin ([place]) 10/21/2019   COVID-19 virus infection    Mass of soft tissue of face 09/23/2018   Onychomycosis due to dermatophyte 03/29/2018   Anemia, iron deficiency 07/18/2016   Anemia in chronic kidney disease 07/18/2016   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, stage 5 ([place]) 05/29/2016   Suspected sleep apnea 05/24/2015   Chronic combined systolic and diastolic CHF (congestive heart failure) ([place]) 03/01/2015   Nonischemic cardiomyopathy ([place])    Chest pain 01/09/2015   Asthma    Hyperlipidemia 03/31/2012   Obesity, Class I, BMI 30-34.9 03/21/2011   Essential hypertension 03/21/2011   Gastroesophageal reflux disease 03/21/2011   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    Past Surgical History:  Procedure Laterality Date   AV FISTULA PLACEMENT Right 12/18/2020   Procedure: RIGHT ARM RADIOCEPHALIC  ARTERIOVENOUS (AV) FISTULA CREATION;  Surgeon: Waynetta Sandy, MD;  Location: [facility];  Service: Vascular;  Laterality: Right;   CARDIAC CATHETERIZATION N/A 02/01/2015   Procedure: Right/Left Heart Cath and Coronary Angiography;  Surgeon:  Josue Hector, MD;  Location: [facility];  Service: Cardiovascular;  Laterality: N/A;   IR FLUORO GUIDE CV LINE RIGHT  10/11/2019   IR US GUIDE VASC ACCESS RIGHT  10/11/2019   THORACOTOMY Left 11/08/2013   Procedure: LEFT THORACOTOMY;  Surgeon: Ivin Poot, MD;  Location: [facility] OR;  Service: Thoracic;  Laterality: Left;       Family History  Problem Relation Age of Onset   Diabetes Mellitus II Mother        died @ 50.   Gastric cancer Mother    CAD Father        died @ 51.    Heart attack Father    Congestive Heart Failure Father    Diabetes Mellitus II Sister    CAD Sister        s/p PCI - age 14.    Social History   Tobacco Use   Smoking status: Never   Smokeless tobacco: Never  Vaping Use   Vaping Use: Never used  Substance Use Topics   Alcohol use: No    Alcohol/week: 0.0 standard drinks   Drug use: Yes    Frequency: 7.0 times per week    Types: Marijuana    Comment: last used 09/15/2020    Home Medications Prior to Admission medications   Medication Sig Start Date End Date Taking? Authorizing Provider  Accu-Chek FastClix Lancets MISC Check your blood sugars 4 times a day. 08/01/19   Katherine Roan, MD  ACCU-CHEK GUIDE test strip USE TO CHECK BLOOD SUGAR 4 TIMES DAILY 08/01/19   Katherine Roan, MD  acetaminophen (TYLENOL) 325 MG tablet Take 650 mg by mouth every 6 (six) hours as needed for moderate pain.    [provider]  amLODipine (NORVASC) 10 MG tablet Take 1 tablet (10 mg total) by mouth daily. 10/15/20 10/15/21  Cato Mulligan, MD  atorvastatin (LIPITOR) 80 MG tablet Take 1 tablet (80 mg total) by mouth daily. 05/09/21   Bensimhon, Shaune Pascal, MD  blood glucose meter kit and supplies KIT Dispense based on patient and insurance preference. Use up to four times daily as directed. (FOR ICD-9 250.00, 250.01). 07/21/18   Katherine Roan, MD  calcitRIOL (ROCALTROL) 0.25 MCG capsule Take 1 capsule (0.25 mcg total) by mouth daily. 05/02/21     Calcium Acetate 667 MG TABS Take 667 mg by mouth 3 (three) times daily with meals.    [provider]  carvedilol (COREG) 25 MG tablet Take 1.5 tablets (37.5 mg total) by mouth 2 (two) times daily with a meal. 04/03/21   [name], MD  cephALEXin (KEFLEX) 500 MG capsule Take 1 capsule (500 mg total) by mouth 2 (two) times daily. 05/14/21     cloNIDine (CATAPRES) 0.3 MG tablet Take 1 tablet (0.3 mg total) by mouth 2 (two) times daily. 04/10/21   [name], MD  glipiZIDE (GLUCOTROL)  5 MG tablet Take 1 tablet (5 mg total) by mouth daily. 10/15/20   Cato Mulligan, MD  hydrALAZINE (APRESOLINE) 100 MG tablet Take 1 tablet (100 mg total) by mouth 3 (three) times daily. 05/09/21   Bensimhon, Shaune Pascal, MD  insulin aspart (NOVOLOG) 100 UNIT/ML injection Inject 15 Units into the skin 3 (three) times daily with meals. 05/27/21   Velna Ochs, MD  insulin glargine (LANTUS) 100 UNIT/ML injection Inject 0.37 mLs (37 Units total) into the skin at bedtime. Patient taking differently: Inject 35 Units into the skin at bedtime. 04/10/21  Gaylan Gerold, DO  Insulin Syringe-Needle U-100 Flossie Buffy INSULIN SYRINGE) 30G X 1/2" 0.5 ML MISC USE 4 TIMES DAILY WITH INSULIN INJECTIONS. 10/15/20   Cato Mulligan, MD  Insulin Syringe-Needle U-100 30G X 1/2" 0.5 ML MISC USE 4 TIMES DAILY WITH INSULIN INJECTIONS. 10/15/20 10/15/21  Cato Mulligan, MD  isosorbide mononitrate (IMDUR) 30 MG 24 hr tablet TAKE 1 TABLET BY MOUTH DAILY 11/15/20 11/15/21  Loren Racer, PA-C  nitroGLYCERIN (NITROSTAT) 0.4 MG SL tablet PLACE 1 TABLET UNDER THE TONGUE EVERY 5 MINUTES AS NEEDED FOR CHEST PAIN. 06/28/19   [name], MD  ondansetron (ZOFRAN-ODT) 4 MG disintegrating tablet Dissolve 1 (one) Tablet by mouth every 8 hours as needed for nausea and vomiting Patient not taking: No sig reported 02/22/21     sodium bicarbonate 650 MG tablet Take 2 tablets (1,300 mg total) by mouth 3 (three) times daily. 02/08/21     UNIFINE PENTIPS 32G X 4 MM MISC USE ONCE DAILY WITH VICTOZA 08/01/19   Katherine Roan, MD  Vitamin D, Ergocalciferol, (DRISDOL) 1.25 MG (50000 UNIT) CAPS capsule TAKE 1 CAPSULE BY MOUTH TWICE WEEKLY 09/10/20 09/10/21  Edrick Oh, MD    Allergies    Acyclovir and related  Review of Systems   Review of Systems  Constitutional:  Positive for chills. Negative for fever.  HENT:  Negative for congestion and sore throat.   Respiratory:  Negative for chest tightness and shortness of breath.   Cardiovascular:   Negative for chest pain and palpitations.  Gastrointestinal:  Positive for nausea and vomiting.  Genitourinary:  Negative for dysuria and hematuria.  Musculoskeletal:  Positive for back pain.  Neurological:  Positive for headaches. Negative for dizziness.  All other systems reviewed and are negative.  Physical Exam Updated Vital Signs BP (!) 172/97   Pulse (!) 101   Temp 99.6 F (37.6 C) (Oral)   Resp 18   SpO2 100%   Physical Exam Vitals and nursing note reviewed.  Constitutional:      General: He is not in acute distress.    Appearance: Normal appearance. He is obese.  HENT:     Head: Normocephalic and atraumatic.     Right Ear: Tympanic membrane normal.     Left Ear: Tympanic membrane normal.     Nose: Nose normal.     Mouth/Throat:     Mouth: Mucous membranes are moist.     Pharynx: Oropharynx is clear.  Eyes:     General: No scleral icterus.    Extraocular Movements: Extraocular movements intact.     Conjunctiva/sclera: Conjunctivae normal.     Pupils: Pupils are equal, round, and reactive to light.  Cardiovascular:     Rate and Rhythm: Normal rate and regular rhythm.  Pulmonary:     Effort: Pulmonary effort is normal. No respiratory distress.     Breath sounds: Normal breath sounds. No wheezing or rales.  Skin:    General: Skin is warm and dry.     Findings: No rash.  Neurological:     Mental Status: He is alert.  Psychiatric:        Mood and Affect: Mood normal.    ED Results / Procedures / Treatments   Labs (all labs ordered are listed, but only abnormal results are displayed) Labs Reviewed  LIPASE, BLOOD - Abnormal; Notable for the following components:      Result Value   Lipase 87 (*)    All other components within normal limits  COMPREHENSIVE METABOLIC  PANEL - Abnormal; Notable for the following components:   CO2 19 (*)    Glucose, Bld 101 (*)    BUN 60 (*)    Creatinine, Ser 9.06 (*)    GFR, Estimated 7 (*)    All other components within  normal limits  CBC - Abnormal; Notable for the following components:   WBC 11.1 (*)    Hemoglobin 12.2 (*)    RDW 16.3 (*)    All other components within normal limits  RESP PANEL BY RT-PCR (FLU A&B, COVID) ARPGX2  URINALYSIS, ROUTINE W REFLEX MICROSCOPIC    EKG None  Radiology DG Chest Portable 1 View  Result Date: 07/02/2021 CLINICAL DATA:  Shortness of breath EXAM: PORTABLE CHEST 1 VIEW COMPARISON:  Chest x-ray 02/29/2020 FINDINGS: Heart size is normal. Mediastinum appears stable. Left-sided central line with the tip at the cavoatrial junction. Pulmonary vasculature within normal limits. No focal consolidation identified. No significant pleural effusion. No pneumothorax. IMPRESSION: No acute intrathoracic process identified. Electronically Signed   By: Ofilia Neas   On: 07/02/2021 14:14    Procedures Procedures   Medications Ordered in ED Medications  morphine 4 MG/ML injection 4 mg (has no administration in time range)  ondansetron (ZOFRAN) injection 4 mg (4 mg Intravenous Given 07/02/21 1417)  morphine 4 MG/ML injection 4 mg (4 mg Intravenous Given 07/02/21 1417)    ED Course  I have reviewed the triage vital signs and the nursing notes.  Pertinent labs & imaging results that were available during my care of the patient were reviewed by me and considered in my medical decision making (see chart for details).  Clinical Course as of 07/02/21 1524  Tue Jul 02, 2021  1522 Missed Dialysis, trops pending. D/c if negative [SS]    Clinical Course User Index [SS] Smoot, Leary Roca, PA-C   MDM Rules/Calculators/A&P The emergent differential diagnosis of chest pain includes: Acute coronary syndrome, pericarditis, aortic dissection, pulmonary embolism, tension pneumothorax, and esophageal rupture.  All of these were considered throughout the evaluation of this patient.  Initially I was concerned because the patient missed his dialysis yesterday.  Patient was not hyperkalemic and  his EKG was stable.  Lipase was noted to be elevated however patient is nontender and not complaining of abdominal pain.  I do not believe we need to chase a potential pancreatitis.  Patient's creatinine is 9, which is lower than his last measurement at 10.  This is good considering his missed dialysis.  Chest x-ray nonrevealing of pulmonary edema.  Patient does not appear fluid overloaded and lung sounds were clear.  Troponins are pending at this time, however I have a low suspicion of ACS.  I am unsure if his symptoms are related to his missed dialysis.  It is possible he also has a viral illness such as the flu, COVID, gastroenteritis, etc.  At this point I anticipate discharge home with encouragement to not miss any more if his troponins are negative.  This plan and work-up thus far have been discussed with both the patient and his wife.  They are agreeable to the plan at this point. Patient signed out to PA Smooth at discharge, see her note for further care and ultimate dispo.   Final Clinical Impression(s) / ED Diagnoses Final diagnoses:  None    Rx / DC Orders Patient handed off to PA Smoot at shift change.  Please see her note for further care and disposition.    Rhae Hammock, PA-C 07/02/21  [place], [place], MD 07/03/21 [phone]

## 2021-07-03 ENCOUNTER — Other Ambulatory Visit (HOSPITAL_COMMUNITY): Payer: Self-pay

## 2021-07-03 ENCOUNTER — Telehealth: Payer: Self-pay

## 2021-07-03 ENCOUNTER — Encounter (HOSPITAL_COMMUNITY): Payer: Self-pay | Admitting: *Deleted

## 2021-07-03 DIAGNOSIS — T8249XA Other complication of vascular dialysis catheter, initial encounter: Secondary | ICD-10-CM | POA: Diagnosis not present

## 2021-07-03 DIAGNOSIS — N186 End stage renal disease: Secondary | ICD-10-CM | POA: Diagnosis not present

## 2021-07-03 DIAGNOSIS — D689 Coagulation defect, unspecified: Secondary | ICD-10-CM | POA: Diagnosis not present

## 2021-07-03 DIAGNOSIS — D631 Anemia in chronic kidney disease: Secondary | ICD-10-CM | POA: Diagnosis not present

## 2021-07-03 DIAGNOSIS — N2581 Secondary hyperparathyroidism of renal origin: Secondary | ICD-10-CM | POA: Diagnosis not present

## 2021-07-03 DIAGNOSIS — Z992 Dependence on renal dialysis: Secondary | ICD-10-CM | POA: Diagnosis not present

## 2021-07-03 NOTE — Telephone Encounter (Signed)
Transition Care Management Unsuccessful Follow-up Telephone Call  Date of discharge and from where:  07/02/2021 from [facility]  Attempts:  1st Attempt  Reason for unsuccessful TCM follow-up call:  Left voice message

## 2021-07-04 NOTE — Telephone Encounter (Signed)
Transition Care Management Unsuccessful Follow-up Telephone Call  Date of discharge and from where:  07/02/2021 from [facility]  Attempts:  2nd Attempt  Reason for unsuccessful TCM follow-up call:  Left voice message

## 2021-07-05 ENCOUNTER — Other Ambulatory Visit (HOSPITAL_COMMUNITY): Payer: Self-pay

## 2021-07-05 DIAGNOSIS — N186 End stage renal disease: Secondary | ICD-10-CM | POA: Diagnosis not present

## 2021-07-05 DIAGNOSIS — T8249XA Other complication of vascular dialysis catheter, initial encounter: Secondary | ICD-10-CM | POA: Diagnosis not present

## 2021-07-05 DIAGNOSIS — Z992 Dependence on renal dialysis: Secondary | ICD-10-CM | POA: Diagnosis not present

## 2021-07-05 DIAGNOSIS — D689 Coagulation defect, unspecified: Secondary | ICD-10-CM | POA: Diagnosis not present

## 2021-07-05 DIAGNOSIS — N2581 Secondary hyperparathyroidism of renal origin: Secondary | ICD-10-CM | POA: Diagnosis not present

## 2021-07-05 DIAGNOSIS — D631 Anemia in chronic kidney disease: Secondary | ICD-10-CM | POA: Diagnosis not present

## 2021-07-05 NOTE — Telephone Encounter (Signed)
Transition Care Management Unsuccessful Follow-up Telephone Call  Date of discharge and from where:  07/03/2021-[facility]  Attempts:  3rd Attempt  Reason for unsuccessful TCM follow-up call:  Left voice message

## 2021-07-08 DIAGNOSIS — Z992 Dependence on renal dialysis: Secondary | ICD-10-CM | POA: Diagnosis not present

## 2021-07-08 DIAGNOSIS — E1129 Type 2 diabetes mellitus with other diabetic kidney complication: Secondary | ICD-10-CM | POA: Diagnosis not present

## 2021-07-08 DIAGNOSIS — N186 End stage renal disease: Secondary | ICD-10-CM | POA: Diagnosis not present

## 2021-07-08 DIAGNOSIS — N2581 Secondary hyperparathyroidism of renal origin: Secondary | ICD-10-CM | POA: Diagnosis not present

## 2021-07-08 DIAGNOSIS — T8249XA Other complication of vascular dialysis catheter, initial encounter: Secondary | ICD-10-CM | POA: Diagnosis not present

## 2021-07-08 DIAGNOSIS — D689 Coagulation defect, unspecified: Secondary | ICD-10-CM | POA: Diagnosis not present

## 2021-07-10 DIAGNOSIS — Z992 Dependence on renal dialysis: Secondary | ICD-10-CM | POA: Diagnosis not present

## 2021-07-10 DIAGNOSIS — D689 Coagulation defect, unspecified: Secondary | ICD-10-CM | POA: Diagnosis not present

## 2021-07-10 DIAGNOSIS — N2581 Secondary hyperparathyroidism of renal origin: Secondary | ICD-10-CM | POA: Diagnosis not present

## 2021-07-10 DIAGNOSIS — N186 End stage renal disease: Secondary | ICD-10-CM | POA: Diagnosis not present

## 2021-07-11 ENCOUNTER — Other Ambulatory Visit (HOSPITAL_COMMUNITY): Payer: Self-pay

## 2021-07-12 DIAGNOSIS — Z992 Dependence on renal dialysis: Secondary | ICD-10-CM | POA: Diagnosis not present

## 2021-07-12 DIAGNOSIS — D689 Coagulation defect, unspecified: Secondary | ICD-10-CM | POA: Diagnosis not present

## 2021-07-12 DIAGNOSIS — N2581 Secondary hyperparathyroidism of renal origin: Secondary | ICD-10-CM | POA: Diagnosis not present

## 2021-07-12 DIAGNOSIS — N186 End stage renal disease: Secondary | ICD-10-CM | POA: Diagnosis not present

## 2021-07-15 DIAGNOSIS — Z992 Dependence on renal dialysis: Secondary | ICD-10-CM | POA: Diagnosis not present

## 2021-07-15 DIAGNOSIS — N186 End stage renal disease: Secondary | ICD-10-CM | POA: Diagnosis not present

## 2021-07-15 DIAGNOSIS — T8249XA Other complication of vascular dialysis catheter, initial encounter: Secondary | ICD-10-CM | POA: Diagnosis not present

## 2021-07-15 DIAGNOSIS — N2581 Secondary hyperparathyroidism of renal origin: Secondary | ICD-10-CM | POA: Diagnosis not present

## 2021-07-15 DIAGNOSIS — D689 Coagulation defect, unspecified: Secondary | ICD-10-CM | POA: Diagnosis not present

## 2021-07-16 ENCOUNTER — Other Ambulatory Visit (HOSPITAL_COMMUNITY): Payer: Self-pay

## 2021-07-16 NOTE — Addendum Note (Signed)
Addended by: Nicholas Lose on: 07/16/2021 01:29 PM   Modules accepted: Orders

## 2021-07-16 NOTE — Telephone Encounter (Signed)
Received request from [facility] to schedule patient for fistulogram due to right arm fistula not maturing and unable to access.   Spoke with patient who agreed to schedule procedure on non-HD day, 07/23/21.

## 2021-07-17 DIAGNOSIS — N186 End stage renal disease: Secondary | ICD-10-CM | POA: Diagnosis not present

## 2021-07-17 DIAGNOSIS — D689 Coagulation defect, unspecified: Secondary | ICD-10-CM | POA: Diagnosis not present

## 2021-07-17 DIAGNOSIS — Z992 Dependence on renal dialysis: Secondary | ICD-10-CM | POA: Diagnosis not present

## 2021-07-17 DIAGNOSIS — T8249XA Other complication of vascular dialysis catheter, initial encounter: Secondary | ICD-10-CM | POA: Diagnosis not present

## 2021-07-17 DIAGNOSIS — N2581 Secondary hyperparathyroidism of renal origin: Secondary | ICD-10-CM | POA: Diagnosis not present

## 2021-07-19 DIAGNOSIS — N2581 Secondary hyperparathyroidism of renal origin: Secondary | ICD-10-CM | POA: Diagnosis not present

## 2021-07-19 DIAGNOSIS — Z992 Dependence on renal dialysis: Secondary | ICD-10-CM | POA: Diagnosis not present

## 2021-07-19 DIAGNOSIS — N186 End stage renal disease: Secondary | ICD-10-CM | POA: Diagnosis not present

## 2021-07-19 DIAGNOSIS — D689 Coagulation defect, unspecified: Secondary | ICD-10-CM | POA: Diagnosis not present

## 2021-07-19 DIAGNOSIS — T8249XA Other complication of vascular dialysis catheter, initial encounter: Secondary | ICD-10-CM | POA: Diagnosis not present

## 2021-07-22 DIAGNOSIS — D689 Coagulation defect, unspecified: Secondary | ICD-10-CM | POA: Diagnosis not present

## 2021-07-22 DIAGNOSIS — Z79899 Other long term (current) drug therapy: Secondary | ICD-10-CM | POA: Diagnosis not present

## 2021-07-22 DIAGNOSIS — N2589 Other disorders resulting from impaired renal tubular function: Secondary | ICD-10-CM | POA: Diagnosis not present

## 2021-07-22 DIAGNOSIS — E1129 Type 2 diabetes mellitus with other diabetic kidney complication: Secondary | ICD-10-CM | POA: Diagnosis not present

## 2021-07-22 DIAGNOSIS — R82998 Other abnormal findings in urine: Secondary | ICD-10-CM | POA: Diagnosis not present

## 2021-07-22 DIAGNOSIS — R17 Unspecified jaundice: Secondary | ICD-10-CM | POA: Diagnosis not present

## 2021-07-22 DIAGNOSIS — K7689 Other specified diseases of liver: Secondary | ICD-10-CM | POA: Diagnosis not present

## 2021-07-22 DIAGNOSIS — I151 Hypertension secondary to other renal disorders: Secondary | ICD-10-CM | POA: Diagnosis not present

## 2021-07-22 DIAGNOSIS — D509 Iron deficiency anemia, unspecified: Secondary | ICD-10-CM | POA: Diagnosis not present

## 2021-07-22 DIAGNOSIS — E440 Moderate protein-calorie malnutrition: Secondary | ICD-10-CM | POA: Diagnosis not present

## 2021-07-22 DIAGNOSIS — D631 Anemia in chronic kidney disease: Secondary | ICD-10-CM | POA: Diagnosis not present

## 2021-07-22 DIAGNOSIS — N186 End stage renal disease: Secondary | ICD-10-CM | POA: Diagnosis not present

## 2021-07-22 DIAGNOSIS — Z992 Dependence on renal dialysis: Secondary | ICD-10-CM | POA: Diagnosis not present

## 2021-07-23 ENCOUNTER — Encounter (HOSPITAL_COMMUNITY): Admission: RE | Payer: Self-pay | Source: Home / Self Care

## 2021-07-23 ENCOUNTER — Other Ambulatory Visit (HOSPITAL_COMMUNITY): Payer: Self-pay

## 2021-07-23 ENCOUNTER — Ambulatory Visit (HOSPITAL_COMMUNITY): Admission: RE | Admit: 2021-07-23 | Payer: Medicaid Other | Source: Home / Self Care | Admitting: Surgery

## 2021-07-23 ENCOUNTER — Other Ambulatory Visit: Payer: Self-pay | Admitting: Student

## 2021-07-23 SURGERY — A/V FISTULAGRAM
Anesthesia: LOCAL | Laterality: Right

## 2021-07-24 ENCOUNTER — Other Ambulatory Visit (HOSPITAL_COMMUNITY): Payer: Self-pay

## 2021-07-24 MED ORDER — GLIPIZIDE 5 MG PO TABS
5.0000 mg | ORAL_TABLET | Freq: Every day | ORAL | 0 refills | Status: DC
  Filled 2021-07-24: qty 90, 90d supply, fill #0

## 2021-07-25 DIAGNOSIS — R17 Unspecified jaundice: Secondary | ICD-10-CM | POA: Diagnosis not present

## 2021-07-25 DIAGNOSIS — N2589 Other disorders resulting from impaired renal tubular function: Secondary | ICD-10-CM | POA: Diagnosis not present

## 2021-07-25 DIAGNOSIS — Z79899 Other long term (current) drug therapy: Secondary | ICD-10-CM | POA: Diagnosis not present

## 2021-07-25 DIAGNOSIS — I151 Hypertension secondary to other renal disorders: Secondary | ICD-10-CM | POA: Diagnosis not present

## 2021-07-25 DIAGNOSIS — N186 End stage renal disease: Secondary | ICD-10-CM | POA: Diagnosis not present

## 2021-07-25 DIAGNOSIS — R82998 Other abnormal findings in urine: Secondary | ICD-10-CM | POA: Diagnosis not present

## 2021-07-25 DIAGNOSIS — K7689 Other specified diseases of liver: Secondary | ICD-10-CM | POA: Diagnosis not present

## 2021-07-25 DIAGNOSIS — E440 Moderate protein-calorie malnutrition: Secondary | ICD-10-CM | POA: Diagnosis not present

## 2021-07-25 DIAGNOSIS — D631 Anemia in chronic kidney disease: Secondary | ICD-10-CM | POA: Diagnosis not present

## 2021-07-25 DIAGNOSIS — E1129 Type 2 diabetes mellitus with other diabetic kidney complication: Secondary | ICD-10-CM | POA: Diagnosis not present

## 2021-07-25 DIAGNOSIS — Z992 Dependence on renal dialysis: Secondary | ICD-10-CM | POA: Diagnosis not present

## 2021-07-25 DIAGNOSIS — D689 Coagulation defect, unspecified: Secondary | ICD-10-CM | POA: Diagnosis not present

## 2021-07-25 DIAGNOSIS — D509 Iron deficiency anemia, unspecified: Secondary | ICD-10-CM | POA: Diagnosis not present

## 2021-07-29 DIAGNOSIS — E1129 Type 2 diabetes mellitus with other diabetic kidney complication: Secondary | ICD-10-CM | POA: Diagnosis not present

## 2021-07-29 DIAGNOSIS — R82998 Other abnormal findings in urine: Secondary | ICD-10-CM | POA: Diagnosis not present

## 2021-07-29 DIAGNOSIS — Z992 Dependence on renal dialysis: Secondary | ICD-10-CM | POA: Diagnosis not present

## 2021-07-29 DIAGNOSIS — N2589 Other disorders resulting from impaired renal tubular function: Secondary | ICD-10-CM | POA: Diagnosis not present

## 2021-07-29 DIAGNOSIS — R17 Unspecified jaundice: Secondary | ICD-10-CM | POA: Diagnosis not present

## 2021-07-29 DIAGNOSIS — D631 Anemia in chronic kidney disease: Secondary | ICD-10-CM | POA: Diagnosis not present

## 2021-07-29 DIAGNOSIS — N186 End stage renal disease: Secondary | ICD-10-CM | POA: Diagnosis not present

## 2021-07-29 DIAGNOSIS — K7689 Other specified diseases of liver: Secondary | ICD-10-CM | POA: Diagnosis not present

## 2021-07-29 DIAGNOSIS — Z79899 Other long term (current) drug therapy: Secondary | ICD-10-CM | POA: Diagnosis not present

## 2021-07-29 DIAGNOSIS — D689 Coagulation defect, unspecified: Secondary | ICD-10-CM | POA: Diagnosis not present

## 2021-07-29 DIAGNOSIS — D509 Iron deficiency anemia, unspecified: Secondary | ICD-10-CM | POA: Diagnosis not present

## 2021-07-29 DIAGNOSIS — I151 Hypertension secondary to other renal disorders: Secondary | ICD-10-CM | POA: Diagnosis not present

## 2021-07-29 DIAGNOSIS — E440 Moderate protein-calorie malnutrition: Secondary | ICD-10-CM | POA: Diagnosis not present

## 2021-07-29 DIAGNOSIS — T82898A Other specified complication of vascular prosthetic devices, implants and grafts, initial encounter: Secondary | ICD-10-CM | POA: Diagnosis not present

## 2021-07-30 ENCOUNTER — Other Ambulatory Visit (HOSPITAL_COMMUNITY): Payer: Self-pay

## 2021-07-30 ENCOUNTER — Telehealth: Payer: Self-pay

## 2021-07-30 DIAGNOSIS — D631 Anemia in chronic kidney disease: Secondary | ICD-10-CM | POA: Diagnosis not present

## 2021-07-30 DIAGNOSIS — N2589 Other disorders resulting from impaired renal tubular function: Secondary | ICD-10-CM | POA: Diagnosis not present

## 2021-07-30 DIAGNOSIS — E1129 Type 2 diabetes mellitus with other diabetic kidney complication: Secondary | ICD-10-CM | POA: Diagnosis not present

## 2021-07-30 DIAGNOSIS — D689 Coagulation defect, unspecified: Secondary | ICD-10-CM | POA: Diagnosis not present

## 2021-07-30 DIAGNOSIS — R17 Unspecified jaundice: Secondary | ICD-10-CM | POA: Diagnosis not present

## 2021-07-30 DIAGNOSIS — D509 Iron deficiency anemia, unspecified: Secondary | ICD-10-CM | POA: Diagnosis not present

## 2021-07-30 DIAGNOSIS — K7689 Other specified diseases of liver: Secondary | ICD-10-CM | POA: Diagnosis not present

## 2021-07-30 DIAGNOSIS — I151 Hypertension secondary to other renal disorders: Secondary | ICD-10-CM | POA: Diagnosis not present

## 2021-07-30 DIAGNOSIS — R82998 Other abnormal findings in urine: Secondary | ICD-10-CM | POA: Diagnosis not present

## 2021-07-30 DIAGNOSIS — E440 Moderate protein-calorie malnutrition: Secondary | ICD-10-CM | POA: Diagnosis not present

## 2021-07-30 DIAGNOSIS — Z992 Dependence on renal dialysis: Secondary | ICD-10-CM | POA: Diagnosis not present

## 2021-07-30 DIAGNOSIS — N186 End stage renal disease: Secondary | ICD-10-CM | POA: Diagnosis not present

## 2021-07-30 DIAGNOSIS — Z79899 Other long term (current) drug therapy: Secondary | ICD-10-CM | POA: Diagnosis not present

## 2021-07-30 MED ORDER — LOSARTAN POTASSIUM 50 MG PO TABS
50.0000 mg | ORAL_TABLET | Freq: Every evening | ORAL | 3 refills | Status: DC
  Filled 2021-07-30: qty 90, 90d supply, fill #0

## 2021-07-30 NOTE — Telephone Encounter (Signed)
Patient cancelled fistulogram on 07/23/21 due reported woke up vomiting. Spoke with patient and requested to reschedule on next available Wed. States he does home dialysis on M-T-TH-F. Patient scheduled on 08/07/21. Verbalized understanding.

## 2021-07-30 NOTE — Addendum Note (Signed)
Addended by: Nicholas Lose on: 07/30/2021 03:05 PM   Modules accepted: Orders

## 2021-07-31 ENCOUNTER — Other Ambulatory Visit (HOSPITAL_COMMUNITY): Payer: Self-pay

## 2021-07-31 MED FILL — Ergocalciferol Cap 1.25 MG (50000 Unit): ORAL | 28 days supply | Qty: 8 | Fill #5 | Status: AC

## 2021-08-02 ENCOUNTER — Other Ambulatory Visit (HOSPITAL_COMMUNITY): Payer: Self-pay

## 2021-08-05 DIAGNOSIS — D631 Anemia in chronic kidney disease: Secondary | ICD-10-CM | POA: Diagnosis not present

## 2021-08-05 DIAGNOSIS — I151 Hypertension secondary to other renal disorders: Secondary | ICD-10-CM | POA: Diagnosis not present

## 2021-08-05 DIAGNOSIS — R17 Unspecified jaundice: Secondary | ICD-10-CM | POA: Diagnosis not present

## 2021-08-05 DIAGNOSIS — R82998 Other abnormal findings in urine: Secondary | ICD-10-CM | POA: Diagnosis not present

## 2021-08-05 DIAGNOSIS — N2589 Other disorders resulting from impaired renal tubular function: Secondary | ICD-10-CM | POA: Diagnosis not present

## 2021-08-05 DIAGNOSIS — E440 Moderate protein-calorie malnutrition: Secondary | ICD-10-CM | POA: Diagnosis not present

## 2021-08-05 DIAGNOSIS — Z992 Dependence on renal dialysis: Secondary | ICD-10-CM | POA: Diagnosis not present

## 2021-08-05 DIAGNOSIS — E1129 Type 2 diabetes mellitus with other diabetic kidney complication: Secondary | ICD-10-CM | POA: Diagnosis not present

## 2021-08-05 DIAGNOSIS — K7689 Other specified diseases of liver: Secondary | ICD-10-CM | POA: Diagnosis not present

## 2021-08-05 DIAGNOSIS — D509 Iron deficiency anemia, unspecified: Secondary | ICD-10-CM | POA: Diagnosis not present

## 2021-08-05 DIAGNOSIS — D689 Coagulation defect, unspecified: Secondary | ICD-10-CM | POA: Diagnosis not present

## 2021-08-05 DIAGNOSIS — Z79899 Other long term (current) drug therapy: Secondary | ICD-10-CM | POA: Diagnosis not present

## 2021-08-05 DIAGNOSIS — N186 End stage renal disease: Secondary | ICD-10-CM | POA: Diagnosis not present

## 2021-08-06 ENCOUNTER — Other Ambulatory Visit (HOSPITAL_COMMUNITY): Payer: Self-pay

## 2021-08-07 ENCOUNTER — Encounter (HOSPITAL_COMMUNITY): Admission: RE | Payer: Self-pay | Source: Home / Self Care

## 2021-08-07 ENCOUNTER — Other Ambulatory Visit: Payer: Self-pay

## 2021-08-07 ENCOUNTER — Telehealth: Payer: Self-pay

## 2021-08-07 ENCOUNTER — Ambulatory Visit (HOSPITAL_COMMUNITY): Admission: RE | Admit: 2021-08-07 | Payer: Medicaid Other | Source: Home / Self Care | Admitting: Vascular Surgery

## 2021-08-07 DIAGNOSIS — E1129 Type 2 diabetes mellitus with other diabetic kidney complication: Secondary | ICD-10-CM | POA: Diagnosis not present

## 2021-08-07 DIAGNOSIS — Z992 Dependence on renal dialysis: Secondary | ICD-10-CM | POA: Diagnosis not present

## 2021-08-07 DIAGNOSIS — N186 End stage renal disease: Secondary | ICD-10-CM | POA: Diagnosis not present

## 2021-08-07 SURGERY — A/V FISTULAGRAM
Anesthesia: LOCAL | Laterality: Right

## 2021-08-07 NOTE — Telephone Encounter (Signed)
Patient calls today to report he cannot get to the hospital today for procedure due to transportation issues/rain. Discussed with Dr. Donzetta Matters - rescheduled patient for 12/5.

## 2021-08-09 ENCOUNTER — Other Ambulatory Visit (HOSPITAL_COMMUNITY): Payer: Self-pay

## 2021-08-12 ENCOUNTER — Encounter (HOSPITAL_COMMUNITY)
Admission: RE | Disposition: A | Discharge status: Home / Self Care | Payer: Self-pay | Source: Home / Self Care | Attending: Vascular Surgery

## 2021-08-12 ENCOUNTER — Ambulatory Visit (HOSPITAL_COMMUNITY)
Admission: RE | Admit: 2021-08-12 | Discharge: 2021-08-12 | Disposition: A | Discharge status: Home / Self Care | Payer: Medicaid Other | Attending: Vascular Surgery | Admitting: Vascular Surgery

## 2021-08-12 ENCOUNTER — Other Ambulatory Visit: Payer: Self-pay

## 2021-08-12 DIAGNOSIS — Y841 Kidney dialysis as the cause of abnormal reaction of the patient, or of later complication, without mention of misadventure at the time of the procedure: Secondary | ICD-10-CM | POA: Diagnosis not present

## 2021-08-12 DIAGNOSIS — N186 End stage renal disease: Secondary | ICD-10-CM | POA: Insufficient documentation

## 2021-08-12 DIAGNOSIS — Z794 Long term (current) use of insulin: Secondary | ICD-10-CM | POA: Diagnosis not present

## 2021-08-12 DIAGNOSIS — T82898A Other specified complication of vascular prosthetic devices, implants and grafts, initial encounter: Secondary | ICD-10-CM | POA: Diagnosis not present

## 2021-08-12 DIAGNOSIS — Z992 Dependence on renal dialysis: Secondary | ICD-10-CM | POA: Insufficient documentation

## 2021-08-12 DIAGNOSIS — T82510A Breakdown (mechanical) of surgically created arteriovenous fistula, initial encounter: Secondary | ICD-10-CM | POA: Diagnosis present

## 2021-08-12 HISTORY — PX: A/V FISTULAGRAM: CATH118298

## 2021-08-12 LAB — POCT I-STAT, CHEM 8
BUN: 47 mg/dL — ABNORMAL HIGH (ref 6–20)
Calcium, Ion: 1.18 mmol/L (ref 1.15–1.40)
Chloride: 100 mmol/L (ref 98–111)
Creatinine, Ser: 9.7 mg/dL — ABNORMAL HIGH (ref 0.61–1.24)
Glucose, Bld: 233 mg/dL — ABNORMAL HIGH (ref 70–99)
HCT: 35 % — ABNORMAL LOW (ref 39.0–52.0)
Hemoglobin: 11.9 g/dL — ABNORMAL LOW (ref 13.0–17.0)
Potassium: 4.6 mmol/L (ref 3.5–5.1)
Sodium: 140 mmol/L (ref 135–145)
TCO2: 27 mmol/L (ref 22–32)

## 2021-08-12 LAB — GLUCOSE, CAPILLARY: Glucose-Capillary: 173 mg/dL — ABNORMAL HIGH (ref 70–99)

## 2021-08-12 SURGERY — A/V FISTULAGRAM
Anesthesia: LOCAL

## 2021-08-12 MED ORDER — SODIUM CHLORIDE 0.9% FLUSH: 3.0000 mL | Freq: Two times a day (BID) | INTRAVENOUS | Status: DC

## 2021-08-12 MED ORDER — LIDOCAINE HCL (PF) 1 % IJ SOLN
INTRAMUSCULAR | Status: AC
  Filled 2021-08-12: qty 30

## 2021-08-12 MED ORDER — HEPARIN (PORCINE) IN NACL 1000-0.9 UT/500ML-% IV SOLN
INTRAVENOUS | Status: DC | PRN
  Administered 2021-08-12: 500 mL

## 2021-08-12 MED ORDER — SODIUM CHLORIDE 0.9 % IV SOLN: 250.0000 mL | INTRAVENOUS | Status: DC | PRN

## 2021-08-12 MED ORDER — HEPARIN (PORCINE) IN NACL 1000-0.9 UT/500ML-% IV SOLN
INTRAVENOUS | Status: AC
  Filled 2021-08-12: qty 500

## 2021-08-12 MED ORDER — LIDOCAINE HCL (PF) 1 % IJ SOLN
INTRAMUSCULAR | Status: DC | PRN
  Administered 2021-08-12: 2 mL via INTRADERMAL

## 2021-08-12 MED ORDER — SODIUM CHLORIDE 0.9% FLUSH: 3.0000 mL | INTRAVENOUS | Status: DC | PRN

## 2021-08-12 SURGICAL SUPPLY — 10 items
COVER DOME SNAP 22 D (MISCELLANEOUS) ×2 IMPLANT
GLIDEWIRE NITREX 0.018X80X5 (WIRE) ×2
GUIDEWIRE NITREX 0.018X80X5 (WIRE) IMPLANT
KIT MICROPUNCTURE NIT STIFF (SHEATH) ×1 IMPLANT
PROTECTION STATION PRESSURIZED (MISCELLANEOUS) ×2
SHEATH PROBE COVER 6X72 (BAG) ×2 IMPLANT
STATION PROTECTION PRESSURIZED (MISCELLANEOUS) ×1 IMPLANT
STOPCOCK MORSE 400PSI 3WAY (MISCELLANEOUS) ×2 IMPLANT
TRAY PV CATH (CUSTOM PROCEDURE TRAY) ×2 IMPLANT
TUBING CIL FLEX 10 FLL-RA (TUBING) ×2 IMPLANT

## 2021-08-12 NOTE — H&P (Signed)
HPI:  This is a 40 y.o. male who is s/p right arm radial artery to cephalic vein av fistula creation by Dr. Donzetta Matters in 12/18/2020. He was alst seen on 02/01/2021 and noted to have good maturation and AVF would be ready for access, if necessary, on 03/19/2021. Dr. Justin Mend has asked [place] to re-assess capability to access his fistula. He is very close to needing dialysis per Dr. Jason Nest progress note dated 03/27/2021.  He has occasional tingling in his right hand; no pain. His wife accompanies him today.        Allergies  Allergen Reactions   Acyclovir And Related Other (See Comments)      Unknown reaction            Current Outpatient Medications  Medication Sig Dispense Refill   Accu-Chek FastClix Lancets MISC Check your blood sugars 4 times a day. 402 each 1   ACCU-CHEK GUIDE test strip USE TO CHECK BLOOD SUGAR 4 TIMES DAILY 375 strip 1   acetaminophen (TYLENOL) 325 MG tablet Take 650 mg by mouth every 6 (six) hours as needed for moderate pain.       amLODipine (NORVASC) 10 MG tablet Take 1 tablet (10 mg total) by mouth daily. 90 tablet 3   atorvastatin (LIPITOR) 80 MG tablet Take 1 tablet (80 mg total) by mouth daily. 90 tablet 3   blood glucose meter kit and supplies KIT Dispense based on patient and insurance preference. Use up to four times daily as directed. (FOR ICD-9 250.00, 250.01). 1 each 0   calcitRIOL (ROCALTROL) 0.25 MCG capsule Take 1 capsule (0.25 mcg total) by mouth daily. 30 capsule 6   Calcium Acetate 667 MG TABS Take 667 mg by mouth 3 (three) times daily with meals.       carvedilol (COREG) 25 MG tablet Take 1.5 tablets (37.5 mg total) by mouth 2 (two) times daily with a meal. 270 tablet 3   cloNIDine (CATAPRES) 0.3 MG tablet TAKE 1 TABLET (0.3 MG TOTAL) BY MOUTH TWICE A DAY 60 tablet 6   famotidine (PEPCID) 20 MG tablet Take 20 mg by mouth daily as needed for heartburn or indigestion.       glipiZIDE (GLUCOTROL) 5 MG tablet Take 1 tablet (5 mg total) by mouth daily. 90 tablet 3    hydrALAZINE (APRESOLINE) 100 MG tablet Take 1 tablet (100 mg total) by mouth 3 (three) times daily. 90 tablet 2   insulin aspart (NOVOLOG) 100 UNIT/ML injection Inject 15 Units into the skin 3 (three) times daily with meals.       insulin glargine (LANTUS) 100 UNIT/ML injection Inject 35 Units into the skin at bedtime.       Insulin Syringe-Needle U-100 (ULTICARE INSULIN SYRINGE) 30G X 1/2" 0.5 ML MISC USE 4 TIMES DAILY WITH INSULIN INJECTIONS. 100 each 3   Insulin Syringe-Needle U-100 30G X 1/2" 0.5 ML MISC USE 4 TIMES DAILY WITH INSULIN INJECTIONS. 100 each 3   isosorbide mononitrate (IMDUR) 30 MG 24 hr tablet TAKE 1 TABLET BY MOUTH DAILY 30 tablet 6   isosorbide mononitrate (IMDUR) 30 MG 24 hr tablet Take 30 mg by mouth daily.       nitroGLYCERIN (NITROSTAT) 0.4 MG SL tablet PLACE 1 TABLET UNDER THE TONGUE EVERY 5 MINUTES AS NEEDED FOR CHEST PAIN. 25 tablet 3   ondansetron (ZOFRAN-ODT) 4 MG disintegrating tablet Dissolve 1 (one) Tablet by mouth every 8 hours as needed for nausea and vomiting 30 tablet 3  sodium bicarbonate 650 MG tablet Take 2 tablets (1,300 mg total) by mouth 3 (three) times daily. 90 tablet 5   UNIFINE PENTIPS 32G X 4 MM MISC USE ONCE DAILY WITH VICTOZA 100 each 1   Vitamin D, Ergocalciferol, (DRISDOL) 1.25 MG (50000 UNIT) CAPS capsule TAKE 1 CAPSULE BY MOUTH TWICE WEEKLY 8 capsule 12    No current facility-administered medications for this visit.             Facility-Administered Medications Ordered in Other Visits  Medication Dose Route Frequency Provider Last Rate Last Admin   ceFAZolin (ANCEF) 3 g in dextrose 5 % 50 mL IVPB  3 g Intravenous 30 min Pre-Op Waynetta Sandy, MD           ROS:  See HPI   Vitals:   08/12/21 0954  BP: 118/80  Pulse: 60  Temp: 98 F (36.7 C)  SpO2: 100%      General appearance: awake, alert in NAD Respirations: unlabored; no dyspnea at rest Right upper extremity: Hand is warm with 5/5 grip strength. Motor function  and sensation intact. Good bruit and thrill in fistula.  The fistula is not easily palpable along its course.   Dialysis Duplex on 02/01/2021: +------------+----------+-------------+----------+---------------------+  OUTFLOW VEINPSV (cm/s)Diameter (cm)Depth (cm)      Describe         +------------+----------+-------------+----------+---------------------+  Mid UA          66        0.67        0.54                          +------------+----------+-------------+----------+---------------------+  Dist UA         56        0.47        0.28                          +------------+----------+-------------+----------+---------------------+  AC Fossa        48        0.62        0.26                          +------------+----------+-------------+----------+---------------------+  Prox Forearm    81        0.63        0.54                          +------------+----------+-------------+----------+---------------------+  Mid Forearm     72        0.56        0.53                          +------------+----------+-------------+----------+---------------------+  Dist Forearm   267        0.35        0.46   competing branch 0.25  +------------+----------+-------------+----------+---------------------+   Assessment/Plan:   40 year old male now on home dialysis 4 days a week.  He is scheduled for fistulogram with possible intervention today.  Audriella Blakeley C. Donzetta Matters, MD [facility] Office: [phone] Pager: [phone]

## 2021-08-12 NOTE — Op Note (Signed)
    Patient name: Jeremy [name] MRN: [id number] DOB: Feb 02, 1981 Sex: male  08/12/2021 Pre-operative Diagnosis: End-stage renal disease, primary nonfunction right arm AV fistula Post-operative diagnosis:  Same Surgeon:  Eda Paschal. Donzetta Matters, MD Procedure Performed: 1.  Ultrasound-guided cannulation right arm AV fistula 2.  Right upper extremity fistulogram  Indications: 40 year old male with end-stage renal disease currently dialyzing via left IJ catheter.  He has a previously placed right radial artery to cephalic vein fistula which is failed to mature he is now indicated for fistulogram with possible intervention.  Findings: The fistula itself was quite diminutive was actually difficult to cannulate.  We performed fistulogram where the veins were quite small.    We will schedule him for a right arm cephalic or basilic vein fistula versus possible graft on a Monday or Friday in the near future.    Procedure:  The patient was identified in the holding area and taken to room 8.  The patient was then placed supine on the table and prepped and draped in the usual sterile fashion.  A time out was called.  Ultrasound was used to evaluate the right arm AV fistula which was actually quite diminutive.  There is anesthetized 1% lidocaine cannulated with direct ultrasound visualization with a micropuncture needle followed by wire and sheath.  Images saved from record.  We performed right upper extremity fistulogram with the above findings.  We will plan for fistula versus graft on a nondialysis day in the near future.  The sheath was removed and we suture-ligated the cannulation site with 4 Monocryl suture.  He tolerated procedure well without immediate complication.  Contrast: 25cc   Ethie Curless C. Donzetta Matters, MD [facility] Office: [phone] Pager: [phone]

## 2021-08-13 ENCOUNTER — Encounter (HOSPITAL_COMMUNITY): Payer: Self-pay | Admitting: Vascular Surgery

## 2021-08-13 DIAGNOSIS — N2581 Secondary hyperparathyroidism of renal origin: Secondary | ICD-10-CM | POA: Diagnosis not present

## 2021-08-13 DIAGNOSIS — N186 End stage renal disease: Secondary | ICD-10-CM | POA: Diagnosis not present

## 2021-08-13 DIAGNOSIS — Z992 Dependence on renal dialysis: Secondary | ICD-10-CM | POA: Diagnosis not present

## 2021-08-15 ENCOUNTER — Other Ambulatory Visit: Payer: Self-pay

## 2021-08-15 DIAGNOSIS — N2581 Secondary hyperparathyroidism of renal origin: Secondary | ICD-10-CM | POA: Diagnosis not present

## 2021-08-15 DIAGNOSIS — Z992 Dependence on renal dialysis: Secondary | ICD-10-CM | POA: Diagnosis not present

## 2021-08-15 DIAGNOSIS — N186 End stage renal disease: Secondary | ICD-10-CM | POA: Diagnosis not present

## 2021-08-17 DIAGNOSIS — N2581 Secondary hyperparathyroidism of renal origin: Secondary | ICD-10-CM | POA: Diagnosis not present

## 2021-08-17 DIAGNOSIS — Z992 Dependence on renal dialysis: Secondary | ICD-10-CM | POA: Diagnosis not present

## 2021-08-17 DIAGNOSIS — N186 End stage renal disease: Secondary | ICD-10-CM | POA: Diagnosis not present

## 2021-08-21 ENCOUNTER — Encounter (HOSPITAL_COMMUNITY): Payer: Self-pay | Admitting: *Deleted

## 2021-08-21 ENCOUNTER — Other Ambulatory Visit (HOSPITAL_COMMUNITY): Payer: Self-pay

## 2021-08-21 DIAGNOSIS — N186 End stage renal disease: Secondary | ICD-10-CM | POA: Diagnosis not present

## 2021-08-21 DIAGNOSIS — Z992 Dependence on renal dialysis: Secondary | ICD-10-CM | POA: Diagnosis not present

## 2021-08-21 DIAGNOSIS — N2581 Secondary hyperparathyroidism of renal origin: Secondary | ICD-10-CM | POA: Diagnosis not present

## 2021-08-21 MED ORDER — CLONIDINE HCL 0.3 MG PO TABS
0.3000 mg | ORAL_TABLET | Freq: Two times a day (BID) | ORAL | 4 refills | Status: DC
  Filled 2021-08-21: qty 14, 7d supply, fill #0
  Filled 2021-08-21: qty 180, 90d supply, fill #0
  Filled 2021-08-28: qty 180, 90d supply, fill #1
  Filled 2021-11-11: qty 180, 90d supply, fill #2
  Filled 2022-02-06: qty 180, 90d supply, fill #3
  Filled 2022-05-08: qty 180, 90d supply, fill #4

## 2021-08-22 ENCOUNTER — Encounter (HOSPITAL_COMMUNITY): Payer: Self-pay | Admitting: Vascular Surgery

## 2021-08-22 ENCOUNTER — Other Ambulatory Visit: Payer: Self-pay
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
## 2021-08-22 NOTE — Progress Notes (Signed)
PCP - Joneen Boers carter Cardiologist - Loralie Champagne Nephrologist: Dr. Justin Mend  PPM/ICD - denies   Chest x-ray - 07/02/21 EKG - 07/03/21 Stress Test - 01/25/15 ECHO - 06/28/19 Cardiac Cath - 02/01/15  CPAP - denies  Fasting Blood Sugar - 180-200 Checks Blood Sugar 2x/day    ERAS Protcol - NPO  COVID TEST- ambulatory surgery  Anesthesia review: yes, cardiac history  Patient verbally denies any shortness of breath, fever, cough and chest pain during phone call   -------------  SDW INSTRUCTIONS given:  Your procedure is scheduled on 08/23/21.  Report to [facility] Main Entrance "A" at 7:45 A.M., and check in at the Admitting office.  Call this number if you have problems the morning of surgery:  [phone]   Remember:  Do not eat or drink after midnight the night before your surgery      Take these medicines the morning of surgery with A SIP OF WATER  atorvastatin (LIPITOR) calcitRIOL (ROCALTROL)  carvedilol (COREG) cloNIDine (CATAPRES) hydrALAZINE (APRESOLINE)  isosorbide mononitrate (IMDUR)    As of today, STOP taking any Aspirin (unless otherwise instructed by your surgeon) Aleve, Naproxen, Ibuprofen, Motrin, Advil, Goody's, BC's, all herbal medications, fish oil, and all vitamins.  WHAT DO I DO ABOUT MY DIABETES MEDICATION?   Do not take oral diabetes medicines (pills) the morning of surgery.  THE NIGHT BEFORE SURGERY, no evening dose of  glipiZIDE (GLUCOTROL). Take 50% (18 units) of insulin glargine (LANTUS).    THE MORNING OF SURGERY, do not take glipiZIDE (GLUCOTROL).   The day of surgery, do not take other diabetes injectables, including Byetta (exenatide), Bydureon (exenatide ER), Victoza (liraglutide), or Trulicity (dulaglutide).  If your CBG is greater than 220 mg/dL, you may take  of your sliding scale (insulin aspart (NOVOLOG) ) dose of insulin.   HOW TO MANAGE YOUR DIABETES BEFORE AND AFTER SURGERY  Why is it important to control my blood  sugar before and after surgery? Improving blood sugar levels before and after surgery helps healing and can limit problems. A way of improving blood sugar control is eating a healthy diet by:  Eating less sugar and carbohydrates  Increasing activity/exercise  Talking with your doctor about reaching your blood sugar goals High blood sugars (greater than 180 mg/dL) can raise your risk of infections and slow your recovery, so you will need to focus on controlling your diabetes during the weeks before surgery. Make sure that the doctor who takes care of your diabetes knows about your planned surgery including the date and location.  How do I manage my blood sugar before surgery? Check your blood sugar at least 4 times a day, starting 2 days before surgery, to make sure that the level is not too high or low.  Check your blood sugar the morning of your surgery when you wake up and every 2 hours until you get to the Short Stay unit.  If your blood sugar is less than 70 mg/dL, you will need to treat for low blood sugar: Do not take insulin. Treat a low blood sugar (less than 70 mg/dL) with  cup of clear juice (cranberry or apple), 4 glucose tablets, OR glucose gel. Recheck blood sugar in 15 minutes after treatment (to make sure it is greater than 70 mg/dL). If your blood sugar is not greater than 70 mg/dL on recheck, call [phone] for further instructions. Report your blood sugar to the short stay nurse when you get to Short Stay.  If you are admitted  to the hospital after surgery: Your blood sugar will be checked by the staff and you will probably be given insulin after surgery (instead of oral diabetes medicines) to make sure you have good blood sugar levels. The goal for blood sugar control after surgery is 80-180 mg/dL.             Do not wear jewelry, make up, or nail polish            Do not wear lotions, powders, perfumes/colognes, or deodorant.            Do not shave 48 hours prior to  surgery.  Men may shave face and neck.            Do not bring valuables to the hospital.            [facility] is not responsible for any belongings or valuables.  Do NOT Smoke (Tobacco/Vaping) or drink Alcohol 24 hours prior to your procedure If you use a CPAP at night, you may bring all equipment for your overnight stay.   Contacts, glasses, dentures or bridgework may not be worn into surgery.      For patients admitted to the hospital, discharge time will be determined by your treatment team.   Patients discharged the day of surgery will not be allowed to drive home, and someone needs to stay with them for 24 hours.    Special instructions:   [place]- Preparing For Surgery  Before surgery, you can play an important role. Because skin is not sterile, your skin needs to be as free of germs as possible. You can reduce the number of germs on your skin by washing with CHG (chlorahexidine gluconate) Soap before surgery.  CHG is an antiseptic cleaner which kills germs and bonds with the skin to continue killing germs even after washing.    Oral Hygiene is also important to reduce your risk of infection.  Remember - BRUSH YOUR TEETH THE MORNING OF SURGERY WITH YOUR REGULAR TOOTHPASTE  Please do not use if you have an allergy to CHG or antibacterial soaps. If your skin becomes reddened/irritated stop using the CHG.  Do not shave (including legs and underarms) for at least 48 hours prior to first CHG shower. It is OK to shave your face.  Please follow these instructions carefully.   Shower the NIGHT BEFORE SURGERY and the MORNING OF SURGERY with DIAL Soap.   Pat yourself dry with a CLEAN TOWEL.  Wear CLEAN PAJAMAS to bed the night before surgery  Place CLEAN SHEETS on your bed the night of your first shower and DO NOT SLEEP WITH PETS.   Day of Surgery: Please shower morning of surgery  Wear Clean/Comfortable clothing the morning of surgery Do not apply any deodorants/lotions.    Remember to brush your teeth WITH YOUR REGULAR TOOTHPASTE.   Questions were answered. Patient verbalized understanding of instructions.

## 2021-08-22 NOTE — Anesthesia Preprocedure Evaluation (Deleted)
Anesthesia Evaluation    Airway        Dental   Pulmonary           Cardiovascular hypertension,      Neuro/Psych    GI/Hepatic   Endo/Other  diabetes  Renal/GU      Musculoskeletal   Abdominal   Peds  Hematology   Anesthesia Other Findings   Reproductive/Obstetrics                             Anesthesia Physical Anesthesia Plan  ASA:   Anesthesia Plan:    Post-op Pain Management:    Induction:   PONV Risk Score and Plan:   Airway Management Planned:   Additional Equipment:   Intra-op Plan:   Post-operative Plan:   Informed Consent:   Plan Discussed with:   Anesthesia Plan Comments: (PAT note written 08/22/2021 by Myra Gianotti, PA-C. )        Anesthesia Quick Evaluation

## 2021-08-23 ENCOUNTER — Ambulatory Visit (HOSPITAL_COMMUNITY): Admission: RE | Admit: 2021-08-23 | Payer: Medicaid Other | Source: Home / Self Care | Admitting: Vascular Surgery

## 2021-08-23 HISTORY — DX: Nausea with vomiting, unspecified: R11.2

## 2021-08-23 HISTORY — DX: Other specified postprocedural states: Z98.890

## 2021-08-23 SURGERY — ARTERIOVENOUS (AV) FISTULA CREATION
Anesthesia: Choice | Laterality: Right

## 2021-08-23 MED ORDER — PROPOFOL 10 MG/ML IV BOLUS
INTRAVENOUS | Status: AC
  Filled 2021-08-23: qty 20

## 2021-08-24 DIAGNOSIS — Z992 Dependence on renal dialysis: Secondary | ICD-10-CM | POA: Diagnosis not present

## 2021-08-24 DIAGNOSIS — N2581 Secondary hyperparathyroidism of renal origin: Secondary | ICD-10-CM | POA: Diagnosis not present

## 2021-08-24 DIAGNOSIS — N186 End stage renal disease: Secondary | ICD-10-CM | POA: Diagnosis not present

## 2021-08-27 DIAGNOSIS — Z992 Dependence on renal dialysis: Secondary | ICD-10-CM | POA: Diagnosis not present

## 2021-08-27 DIAGNOSIS — D689 Coagulation defect, unspecified: Secondary | ICD-10-CM | POA: Diagnosis not present

## 2021-08-27 DIAGNOSIS — N186 End stage renal disease: Secondary | ICD-10-CM | POA: Diagnosis not present

## 2021-08-27 DIAGNOSIS — N2581 Secondary hyperparathyroidism of renal origin: Secondary | ICD-10-CM | POA: Diagnosis not present

## 2021-08-28 ENCOUNTER — Other Ambulatory Visit (HOSPITAL_COMMUNITY): Payer: Self-pay

## 2021-08-31 DIAGNOSIS — N186 End stage renal disease: Secondary | ICD-10-CM | POA: Diagnosis not present

## 2021-08-31 DIAGNOSIS — D689 Coagulation defect, unspecified: Secondary | ICD-10-CM | POA: Diagnosis not present

## 2021-08-31 DIAGNOSIS — Z992 Dependence on renal dialysis: Secondary | ICD-10-CM | POA: Diagnosis not present

## 2021-08-31 DIAGNOSIS — N2581 Secondary hyperparathyroidism of renal origin: Secondary | ICD-10-CM | POA: Diagnosis not present

## 2021-09-03 DIAGNOSIS — N2581 Secondary hyperparathyroidism of renal origin: Secondary | ICD-10-CM | POA: Diagnosis not present

## 2021-09-03 DIAGNOSIS — Z992 Dependence on renal dialysis: Secondary | ICD-10-CM | POA: Diagnosis not present

## 2021-09-03 DIAGNOSIS — N186 End stage renal disease: Secondary | ICD-10-CM | POA: Diagnosis not present

## 2021-09-03 DIAGNOSIS — D689 Coagulation defect, unspecified: Secondary | ICD-10-CM | POA: Diagnosis not present

## 2021-09-07 DIAGNOSIS — D689 Coagulation defect, unspecified: Secondary | ICD-10-CM | POA: Diagnosis not present

## 2021-09-07 DIAGNOSIS — N2581 Secondary hyperparathyroidism of renal origin: Secondary | ICD-10-CM | POA: Diagnosis not present

## 2021-09-07 DIAGNOSIS — Z992 Dependence on renal dialysis: Secondary | ICD-10-CM | POA: Diagnosis not present

## 2021-09-07 DIAGNOSIS — N186 End stage renal disease: Secondary | ICD-10-CM | POA: Diagnosis not present

## 2021-09-11 ENCOUNTER — Other Ambulatory Visit (HOSPITAL_COMMUNITY): Payer: Self-pay

## 2021-09-11 DIAGNOSIS — Z992 Dependence on renal dialysis: Secondary | ICD-10-CM | POA: Diagnosis not present

## 2021-09-11 DIAGNOSIS — N186 End stage renal disease: Secondary | ICD-10-CM | POA: Diagnosis not present

## 2021-09-11 DIAGNOSIS — N2581 Secondary hyperparathyroidism of renal origin: Secondary | ICD-10-CM | POA: Diagnosis not present

## 2021-09-11 DIAGNOSIS — D689 Coagulation defect, unspecified: Secondary | ICD-10-CM | POA: Diagnosis not present

## 2021-09-12 DIAGNOSIS — D689 Coagulation defect, unspecified: Secondary | ICD-10-CM | POA: Diagnosis not present

## 2021-09-12 DIAGNOSIS — N186 End stage renal disease: Secondary | ICD-10-CM | POA: Diagnosis not present

## 2021-09-12 DIAGNOSIS — N2581 Secondary hyperparathyroidism of renal origin: Secondary | ICD-10-CM | POA: Diagnosis not present

## 2021-09-12 DIAGNOSIS — Z992 Dependence on renal dialysis: Secondary | ICD-10-CM | POA: Diagnosis not present

## 2021-09-14 DIAGNOSIS — Z992 Dependence on renal dialysis: Secondary | ICD-10-CM | POA: Diagnosis not present

## 2021-09-14 DIAGNOSIS — N2581 Secondary hyperparathyroidism of renal origin: Secondary | ICD-10-CM | POA: Diagnosis not present

## 2021-09-14 DIAGNOSIS — N186 End stage renal disease: Secondary | ICD-10-CM | POA: Diagnosis not present

## 2021-09-14 DIAGNOSIS — D689 Coagulation defect, unspecified: Secondary | ICD-10-CM | POA: Diagnosis not present

## 2021-09-17 DIAGNOSIS — N2581 Secondary hyperparathyroidism of renal origin: Secondary | ICD-10-CM | POA: Diagnosis not present

## 2021-09-17 DIAGNOSIS — D689 Coagulation defect, unspecified: Secondary | ICD-10-CM | POA: Diagnosis not present

## 2021-09-17 DIAGNOSIS — Z992 Dependence on renal dialysis: Secondary | ICD-10-CM | POA: Diagnosis not present

## 2021-09-17 DIAGNOSIS — N186 End stage renal disease: Secondary | ICD-10-CM | POA: Diagnosis not present

## 2021-09-20 DIAGNOSIS — D689 Coagulation defect, unspecified: Secondary | ICD-10-CM | POA: Diagnosis not present

## 2021-09-20 DIAGNOSIS — N186 End stage renal disease: Secondary | ICD-10-CM | POA: Diagnosis not present

## 2021-09-20 DIAGNOSIS — Z992 Dependence on renal dialysis: Secondary | ICD-10-CM | POA: Diagnosis not present

## 2021-09-20 DIAGNOSIS — N2581 Secondary hyperparathyroidism of renal origin: Secondary | ICD-10-CM | POA: Diagnosis not present

## 2021-09-24 DIAGNOSIS — N186 End stage renal disease: Secondary | ICD-10-CM | POA: Diagnosis not present

## 2021-09-24 DIAGNOSIS — N2581 Secondary hyperparathyroidism of renal origin: Secondary | ICD-10-CM | POA: Diagnosis not present

## 2021-09-24 DIAGNOSIS — Z992 Dependence on renal dialysis: Secondary | ICD-10-CM | POA: Diagnosis not present

## 2021-09-24 DIAGNOSIS — D689 Coagulation defect, unspecified: Secondary | ICD-10-CM | POA: Diagnosis not present

## 2021-09-26 DIAGNOSIS — N186 End stage renal disease: Secondary | ICD-10-CM | POA: Diagnosis not present

## 2021-09-26 DIAGNOSIS — N2581 Secondary hyperparathyroidism of renal origin: Secondary | ICD-10-CM | POA: Diagnosis not present

## 2021-09-26 DIAGNOSIS — Z992 Dependence on renal dialysis: Secondary | ICD-10-CM | POA: Diagnosis not present

## 2021-09-26 DIAGNOSIS — D689 Coagulation defect, unspecified: Secondary | ICD-10-CM | POA: Diagnosis not present

## 2021-09-27 ENCOUNTER — Other Ambulatory Visit (HOSPITAL_COMMUNITY): Payer: Self-pay

## 2021-09-27 ENCOUNTER — Other Ambulatory Visit (HOSPITAL_COMMUNITY): Payer: Self-pay | Admitting: Internal Medicine

## 2021-09-28 DIAGNOSIS — N186 End stage renal disease: Secondary | ICD-10-CM | POA: Diagnosis not present

## 2021-09-28 DIAGNOSIS — Z992 Dependence on renal dialysis: Secondary | ICD-10-CM | POA: Diagnosis not present

## 2021-09-28 DIAGNOSIS — D689 Coagulation defect, unspecified: Secondary | ICD-10-CM | POA: Diagnosis not present

## 2021-09-28 DIAGNOSIS — N2581 Secondary hyperparathyroidism of renal origin: Secondary | ICD-10-CM | POA: Diagnosis not present

## 2021-09-30 ENCOUNTER — Other Ambulatory Visit (HOSPITAL_COMMUNITY): Payer: Self-pay

## 2021-09-30 ENCOUNTER — Other Ambulatory Visit: Payer: Self-pay | Admitting: Student

## 2021-09-30 MED ORDER — HYDRALAZINE HCL 100 MG PO TABS
100.0000 mg | ORAL_TABLET | Freq: Three times a day (TID) | ORAL | 2 refills | Status: DC
Start: 2021-09-30 — End: 2022-01-31
  Filled 2021-09-30: qty 90, 30d supply, fill #0
  Filled 2021-11-11: qty 90, 30d supply, fill #1
  Filled 2021-12-20: qty 90, 30d supply, fill #2

## 2021-10-01 ENCOUNTER — Other Ambulatory Visit (HOSPITAL_COMMUNITY): Payer: Self-pay

## 2021-10-02 ENCOUNTER — Other Ambulatory Visit: Payer: Self-pay | Admitting: Student

## 2021-10-02 ENCOUNTER — Other Ambulatory Visit (HOSPITAL_COMMUNITY): Payer: Self-pay

## 2021-10-02 DIAGNOSIS — N2581 Secondary hyperparathyroidism of renal origin: Secondary | ICD-10-CM | POA: Diagnosis not present

## 2021-10-02 DIAGNOSIS — D689 Coagulation defect, unspecified: Secondary | ICD-10-CM | POA: Diagnosis not present

## 2021-10-02 DIAGNOSIS — Z992 Dependence on renal dialysis: Secondary | ICD-10-CM | POA: Diagnosis not present

## 2021-10-02 DIAGNOSIS — N186 End stage renal disease: Secondary | ICD-10-CM | POA: Diagnosis not present

## 2021-10-04 ENCOUNTER — Other Ambulatory Visit: Payer: Self-pay

## 2021-10-04 ENCOUNTER — Telehealth: Payer: Self-pay | Admitting: *Deleted

## 2021-10-04 ENCOUNTER — Other Ambulatory Visit (HOSPITAL_COMMUNITY): Payer: Self-pay

## 2021-10-04 MED ORDER — "ULTICARE INSULIN SYRINGE 30G X 1/2"" 0.5 ML MISC"
3 refills | Status: DC
  Filled 2021-10-04: qty 100, 25d supply, fill #0

## 2021-10-04 NOTE — Telephone Encounter (Signed)
Call from [facility] unable to process prescription  for Syringes using Dr. Eulas Post will need to resend using another physician contracted with Medicaid.

## 2021-10-04 NOTE — Addendum Note (Signed)
Addended by: Velora Heckler on: 10/04/2021 09:32 AM   Modules accepted: Orders

## 2021-10-04 NOTE — Telephone Encounter (Signed)
Spoke with Margreta Journey at [facility]. States Dr. Eulas Post is not yet certified with [employer]. Asking for Rx for syringes to be resent.

## 2021-10-05 DIAGNOSIS — N2581 Secondary hyperparathyroidism of renal origin: Secondary | ICD-10-CM | POA: Diagnosis not present

## 2021-10-05 DIAGNOSIS — D689 Coagulation defect, unspecified: Secondary | ICD-10-CM | POA: Diagnosis not present

## 2021-10-05 DIAGNOSIS — N186 End stage renal disease: Secondary | ICD-10-CM | POA: Diagnosis not present

## 2021-10-05 DIAGNOSIS — Z992 Dependence on renal dialysis: Secondary | ICD-10-CM | POA: Diagnosis not present

## 2021-10-07 ENCOUNTER — Other Ambulatory Visit (HOSPITAL_COMMUNITY): Payer: Self-pay

## 2021-10-07 ENCOUNTER — Other Ambulatory Visit: Payer: Self-pay | Admitting: *Deleted

## 2021-10-07 ENCOUNTER — Encounter (HOSPITAL_COMMUNITY): Payer: Self-pay | Admitting: *Deleted

## 2021-10-07 ENCOUNTER — Other Ambulatory Visit: Payer: Self-pay | Admitting: Student

## 2021-10-07 DIAGNOSIS — Z794 Long term (current) use of insulin: Secondary | ICD-10-CM

## 2021-10-07 DIAGNOSIS — E1121 Type 2 diabetes mellitus with diabetic nephropathy: Secondary | ICD-10-CM

## 2021-10-07 MED ORDER — INSULIN ASPART 100 UNIT/ML FLEXPEN
15.0000 [IU] | PEN_INJECTOR | Freq: Three times a day (TID) | SUBCUTANEOUS | 0 refills | Status: DC
  Filled 2021-10-07: qty 39, 87d supply, fill #0

## 2021-10-07 MED ORDER — PEN NEEDLES 31G X 5 MM MISC
1.0000 | Freq: Four times a day (QID) | 2 refills | Status: DC
  Filled 2021-10-07: qty 100, 25d supply, fill #0

## 2021-10-07 MED ORDER — "ULTICARE INSULIN SYRINGE 30G X 1/2"" 0.5 ML MISC"
3 refills | Status: AC
  Filled 2021-10-07: qty 100, 25d supply, fill #0

## 2021-10-07 MED ORDER — INSULIN GLARGINE 100 UNITS/ML SOLOSTAR PEN
37.0000 [IU] | PEN_INJECTOR | Freq: Every day | SUBCUTANEOUS | 0 refills | Status: DC
  Filled 2021-10-07: qty 33, 89d supply, fill #0

## 2021-10-07 NOTE — Progress Notes (Signed)
Received message from pharmacy regarding switching his insulin from vials to pens for better adherence.

## 2021-10-07 NOTE — Addendum Note (Signed)
Addended by: Velora Heckler on: 10/07/2021 08:52 AM   Modules accepted: Orders

## 2021-10-07 NOTE — Patient Instructions (Signed)
Visit Information  Jeremy [name] was given information about Medicaid Managed Care team care coordination services as a part of their Healthy [facility] Medicaid benefit. Jeremy [name] verbally consented to engagement with the [facility] Managed Care team.   If you are experiencing a medical emergency, please call 911 or report to your local emergency department or urgent care.   If you have a non-emergency medical problem during routine business hours, please contact your provider's office and ask to speak with a nurse.   For questions related to your Healthy [facility] health plan, please call: [phone] or visit the homepage here: [email or web address]  If you would like to schedule transportation through your Healthy [facility] plan, please call the following number at least 2 days in advance of your appointment: [phone]  Call the [place] at [phone], at any time, 24 hours a day, 7 days a week. If you are in danger or need immediate medical attention call 911.  If you would like help to quit smoking, call 1-800-QUIT-NOW 330-816-5291) OR Espaol: 1-855-Djelo-Ya (2-353-614-4315) o para ms informacin haga clic aqu or Text READY to 200-400 to register via text  Jeremy [name],   Please see education materials related to DM and ESRD provided by MyChart link.  The patient has access to MyChart and can view provided education  Telephone follow up appointment with Managed Medicaid care management team member scheduled for:10/21/21 @ 3:15pm  Lurena Joiner RN, BSN [place] RN Care Coordinator   Following is a copy of your plan of care:  Care Plan : RN Care Manager Plan of Care  Updates made by Melissa Montane, RN since 10/07/2021 12:00 AM     Problem: Health Management Needs related to ESRD, DMII and CHTN      Long-Range Goal: Development of Plan of Care to Address Health Management Needs  related to ESRD, DMII and CHTN   Start Date: 10/07/2021  Expected End Date: 01/05/2022  Priority: High  Note:   Current Barriers:  Chronic Disease Management support and education needs related to DMII and ESRD Financial Constraints.  Jeremy [name] is managing his health. He is concerned about his finances due to having no income. He would like assistance in applying for disability and food benefits. He is attending dialysis 3 times a week on T/Th/S. He is checking his BS 3 times a day and takes all of his medications. He was last seen with his PCP 10/15/2020.  RNCM Clinical Goal(s):  Patient will verbalize understanding of plan for management of DMII and ESRD as evidenced by patient verbalization of self monitoring activities take all medications exactly as prescribed and will call provider for medication related questions as evidenced by documentation in EMR    attend all scheduled medical appointments: Dialysis on T/TH/S and calling to schedule visit with PCP as evidenced by provider documentation in EMR        work with Education officer, museum to address Financial constraints related to lack of income, Limited access to food, and Housing barriers related to the management of DMII and ESRD as evidenced by review of EMR and patient or Education officer, museum report     through collaboration with Consulting civil engineer, provider, and care team.   Interventions: Inter-disciplinary care team collaboration (see longitudinal plan of care) Evaluation of current treatment plan related to  self management and patient's adherence to plan as established by provider   Chronic Kidney Disease (Status: New goal.)  Long Term  Goal  Last practice recorded BP readings:  BP Readings from Last 3 Encounters:  08/12/21 (!) 185/106  07/02/21 (!) 158/86  04/22/21 (!) 167/99  Most recent eGFR/CrCl: No results found for: EGFR  No components found for: CRCL  Evaluation of current treatment plan related to chronic kidney disease self management  and patient's adherence to plan as established by provider      Provided education to patient re: stroke prevention, s/s of heart attack and stroke    Reviewed medications with patient and discussed importance of compliance    Discussed plans with patient for ongoing care management follow up and provided patient with direct contact information for care management team    Assessed social determinant of health barriers      Diabetes:  (Status: New goal.) Long Term Goal   Lab Results  Component Value Date   HGBA1C 7.2 (A) 10/15/2020    Assessed patient's understanding of A1c goal: <7% Provided education to patient about basic DM disease process; Reviewed medications with patient and discussed importance of medication adherence;        Discussed plans with patient for ongoing care management follow up and provided patient with direct contact information for care management team;      Reviewed scheduled/upcoming provider appointments including: Dialysis on T/TH/S and scheduling a PCP visit, [facility] [phone];         Referral made to social work team for assistance with applying for disability, housing and financial resources;      Review of patient status, including review of consultants reports, relevant laboratory and other test results, and medications completed;       Assessed social determinant of health barriers;         Patient Goals/Self-Care Activities: Take medications as prescribed   Attend all scheduled provider appointments Call pharmacy for medication refills 3-7 days in advance of running out of medications Perform all self care activities independently  Perform IADL's (shopping, preparing meals, housekeeping, managing finances) independently Call provider office for new concerns or questions  Work with the social worker to address care coordination needs and will continue to work with the clinical team to address health care and disease management  related needs check blood sugar at prescribed times: three times daily take the blood sugar meter to all doctor visits fill half of plate with vegetables manage portion size

## 2021-10-07 NOTE — Patient Outreach (Signed)
Medicaid Managed Care   Nurse Care Manager Note  10/07/2021 Name:  Jeremy [name] MRN:  [id number] DOB:  02-03-1981  Jeremy [name] is an 41 y.o. year old male who is a primary patient of Rick Duff, MD.  The [facility] Managed Care Coordination team was consulted for assistance with:    ESRD DMII  Mr. Fullwood was given information about Medicaid Managed Care Coordination team services today. [name] agreed to services and verbal consent obtained.  Engaged with patient by telephone for initial visit in response to provider referral for case management and/or care coordination services.   Assessments/Interventions:  Review of past medical history, allergies, medications, health status, including review of consultants reports, laboratory and other test data, was performed as part of comprehensive evaluation and provision of chronic care management services.  SDOH (Social Determinants of Health) assessments and interventions performed: SDOH Interventions    Flowsheet Row Most Recent Value  SDOH Interventions   Food Insecurity Interventions Other (Comment)  [Care Guide referral]  Housing Interventions Other (Comment)  [Care Guide referral]  Transportation Interventions Intervention Not Indicated       Care Plan  Allergies  Allergen Reactions   Acyclovir And Related Hives    Unknown reaction    Medications Reviewed Today     Reviewed by Melissa Montane, RN (Registered Nurse) on 10/07/21 at [facility] [place] List Status: <None>   Medication Order Taking? Sig Documenting Provider Last Dose Status Informant  Accu-Chek FastClix Lancets MISC [id number] Yes Check your blood sugars 4 times a day. Katherine Roan, MD Taking Active Self  ACCU-CHEK GUIDE test strip [id number] Yes USE TO CHECK BLOOD SUGAR 4 TIMES DAILY Katherine Roan, MD Taking Active Self  amLODipine (NORVASC) 10 MG tablet [id number] Yes Take 1 tablet (10 mg total) by mouth daily.  Patient taking  differently: Take 10 mg by mouth every evening.   Cato Mulligan, MD Taking Active Self  atorvastatin (LIPITOR) 80 MG tablet [id number] No Take 1 tablet (80 mg total) by mouth daily.  Patient not taking: Reported on 10/07/2021   BensimhonShaune Pascal, MD Not Taking Active Self  blood glucose meter kit and supplies KIT [id number] Yes Dispense based on patient and insurance preference. Use up to four times daily as directed. (FOR ICD-9 250.00, 250.01). Katherine Roan, MD Taking Active Self  calcitRIOL (ROCALTROL) 0.25 MCG capsule [id number] No Take 1 capsule (0.25 mcg total) by mouth daily.  Patient not taking: Reported on 10/07/2021    Not Taking Active Self           Med Note Vinie Sill Aug 06, 2021  9:45 AM) On hold until receives from pharmacy  carvedilol (COREG) 25 MG tablet [id number] Yes Take 1.5 tablets (37.5 mg total) by mouth 2 (two) times daily with a meal. [name], MD Taking Active Self  cephALEXin (KEFLEX) 500 MG capsule [id number] No Take 1 capsule (500 mg total) by mouth 2 (two) times daily.  Patient not taking: Reported on 08/06/2021    Not Taking Active Self  cloNIDine (CATAPRES) 0.3 MG tablet [id number] Yes Take 1 tablet (0.3 mg total) by mouth 2 (two) times daily. [name], MD Taking Active Self  cloNIDine (CATAPRES) 0.3 MG tablet [id number] Yes Take 1 tablet (0.3 mg total) by mouth 2 (two) times daily as directed  Taking Active   glipiZIDE (GLUCOTROL) 5 MG tablet [id number] Yes Take 1 tablet (5 mg total) by mouth daily. Riesa Pope, MD  Taking Active Self  hydrALAZINE (APRESOLINE) 100 MG tablet [id number] Yes Take 1 tablet (100 mg total) by mouth 3 (three) times daily. Bensimhon, Shaune Pascal, MD Taking Active   insulin aspart (NOVOLOG) 100 UNIT/ML FlexPen [id number] Yes Inject 15 Units into the skin 3 (three) times daily with meals. Gaylan Gerold, DO Taking Active            Med Note (Aaniya Sterba A   Mon Oct 07, 2021  2:43 PM) 17 units with each  meal  insulin glargine (LANTUS) 100 unit/mL SOPN [id number] Yes Inject 37 Units into the skin at bedtime. Gaylan Gerold, DO Taking Active   Insulin Pen Needle (PEN NEEDLES) 31G X 5 MM MISC [id number] Yes use four times daily with insulin Gaylan Gerold, DO Taking Active   Insulin Syringe-Needle U-100 (ULTICARE INSULIN SYRINGE) 30G X 1/2" 0.5 ML MISC [id number] Yes USE 4 TIMES DAILY WITH INSULIN INJECTIONS. Iona Beard, MD Taking Active   isosorbide mononitrate (IMDUR) 30 MG 24 hr tablet [id number] Yes TAKE 1 TABLET BY MOUTH DAILY Loren Racer, PA-C Taking Active Self  losartan (COZAAR) 50 MG tablet [id number] Yes Take 1 tablet (50 mg total) by mouth every evening.  Taking Active Self  multivitamin (RENA-VIT) TABS tablet [id number] Yes Take 1 tablet by mouth daily. [provider] Taking Active Self  nitroGLYCERIN (NITROSTAT) 0.4 MG SL tablet [id number] Yes PLACE 1 TABLET UNDER THE TONGUE EVERY 5 MINUTES AS NEEDED FOR CHEST PAIN. [name], MD Taking Active Self           Med Note Eliseo Squires   Tue Jul 02, 2021  8:15 PM)    ondansetron (ZOFRAN ODT) 4 MG disintegrating tablet [id number] Yes Take 1 tablet (4 mg total) by mouth every 8 (eight) hours as needed for nausea or vomiting. Smoot, Leary Roca, PA-C Taking Active Self  oxyCODONE-acetaminophen (PERCOCET/ROXICET) 5-325 MG tablet [id number] No Take 1 tablet by mouth every 6 (six) hours as needed for severe pain.  Patient not taking: Reported on 08/06/2021   Nestor Lewandowsky Not Taking Active Self  RENVELA 800 MG tablet [id number] Yes Take 1,600 mg by mouth 3 (three) times daily with meals. [provider] Taking Active Self  sodium bicarbonate 650 MG tablet [id number] No Take 2 tablets (1,300 mg total) by mouth 3 (three) times daily.  Patient not taking: Reported on 08/06/2021    Not Taking Active Self            Patient Active Problem List   Diagnosis Date Noted   Unspecified protein-calorie malnutrition ([place])  10/27/2019   Anemia, unspecified 10/26/2019   Acute kidney failure, unspecified ([place]) 10/21/2019   Coagulation defect, unspecified ([place]) 10/21/2019   Personal history of COVID-19 10/21/2019   Secondary hyperparathyroidism of renal origin ([place]) 10/21/2019   COVID-19 virus infection    Mass of soft tissue of face 09/23/2018   Onychomycosis due to dermatophyte 03/29/2018   Anemia, iron deficiency 07/18/2016   Anemia in chronic kidney disease 07/18/2016   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, stage 5 ([place]) 05/29/2016   Suspected sleep apnea 05/24/2015   Chronic combined systolic and diastolic CHF (congestive heart failure) ([place]) 03/01/2015   Nonischemic cardiomyopathy ([place])    Chest pain 01/09/2015   Asthma    Hyperlipidemia 03/31/2012   Obesity, Class I, BMI 30-34.9 03/21/2011   Essential hypertension 03/21/2011   Gastroesophageal reflux disease 03/21/2011   Type 2 diabetes mellitus with diabetic nephropathy ([place]) 02/20/2011  Conditions to be addressed/monitored per PCP order:  DMII and ESRD  Care Plan : RN Care Manager Plan of Care  Updates made by Melissa Montane, RN since 10/07/2021 12:00 AM     Problem: Health Management Needs related to ESRD, DMII and CHTN      Long-Range Goal: Development of Plan of Care to Address Health Management Needs related to ESRD, DMII and CHTN   Start Date: 10/07/2021  Expected End Date: 01/05/2022  Priority: High  Note:   Current Barriers:  Chronic Disease Management support and education needs related to DMII and ESRD Financial Constraints.  Mr. Bantz is managing his health. He is concerned about his finances due to having no income. He would like assistance in applying for disability and food benefits. He is attending dialysis 3 times a week on T/Th/S. He is checking his BS 3 times a day and takes all of his medications. He was last seen with his PCP 10/15/2020.  RNCM Clinical Goal(s):  Patient will verbalize  understanding of plan for management of DMII and ESRD as evidenced by patient verbalization of self monitoring activities take all medications exactly as prescribed and will call provider for medication related questions as evidenced by documentation in EMR    attend all scheduled medical appointments: Dialysis on T/TH/S and calling to schedule visit with PCP as evidenced by provider documentation in EMR        work with Education officer, museum to address Financial constraints related to lack of income, Limited access to food, and Housing barriers related to the management of DMII and ESRD as evidenced by review of EMR and patient or Education officer, museum report     through collaboration with Consulting civil engineer, provider, and care team.   Interventions: Inter-disciplinary care team collaboration (see longitudinal plan of care) Evaluation of current treatment plan related to  self management and patient's adherence to plan as established by provider   Chronic Kidney Disease (Status: New goal.)  Long Term Goal  Last practice recorded BP readings:  BP Readings from Last 3 Encounters:  08/12/21 (!) 185/106  07/02/21 (!) 158/86  04/22/21 (!) 167/99  Most recent eGFR/CrCl: No results found for: EGFR  No components found for: CRCL  Evaluation of current treatment plan related to chronic kidney disease self management and patient's adherence to plan as established by provider      Provided education to patient re: stroke prevention, s/s of heart attack and stroke    Reviewed medications with patient and discussed importance of compliance    Discussed plans with patient for ongoing care management follow up and provided patient with direct contact information for care management team    Assessed social determinant of health barriers      Diabetes:  (Status: New goal.) Long Term Goal   Lab Results  Component Value Date   HGBA1C 7.2 (A) 10/15/2020    Assessed patient's understanding of A1c goal: <7% Provided  education to patient about basic DM disease process; Reviewed medications with patient and discussed importance of medication adherence;        Discussed plans with patient for ongoing care management follow up and provided patient with direct contact information for care management team;      Reviewed scheduled/upcoming provider appointments including: Dialysis on T/TH/S and scheduling a PCP visit, [facility] [phone];         Referral made to social work team for assistance with applying for disability, housing and financial resources;  Review of patient status, including review of consultants reports, relevant laboratory and other test results, and medications completed;       Assessed social determinant of health barriers;         Patient Goals/Self-Care Activities: Take medications as prescribed   Attend all scheduled provider appointments Call pharmacy for medication refills 3-7 days in advance of running out of medications Perform all self care activities independently  Perform IADL's (shopping, preparing meals, housekeeping, managing finances) independently Call provider office for new concerns or questions  Work with the social worker to address care coordination needs and will continue to work with the clinical team to address health care and disease management related needs check blood sugar at prescribed times: three times daily take the blood sugar meter to all doctor visits fill half of plate with vegetables manage portion size       Follow Up:  Patient agrees to Care Plan and Follow-up.  Plan: The Managed Medicaid care management team will reach out to the patient again over the next 14 days.  Date/time of next scheduled RN care management/care coordination outreach:  10/21/21 @ 3:15pm  Lurena Joiner RN, BSN [place] RN Care Coordinator

## 2021-10-08 ENCOUNTER — Encounter (HOSPITAL_COMMUNITY): Payer: Self-pay | Admitting: Vascular Surgery

## 2021-10-08 ENCOUNTER — Other Ambulatory Visit: Payer: Self-pay

## 2021-10-08 ENCOUNTER — Telehealth: Payer: Self-pay | Admitting: Student

## 2021-10-08 DIAGNOSIS — E1129 Type 2 diabetes mellitus with other diabetic kidney complication: Secondary | ICD-10-CM | POA: Diagnosis not present

## 2021-10-08 DIAGNOSIS — N186 End stage renal disease: Secondary | ICD-10-CM | POA: Diagnosis not present

## 2021-10-08 DIAGNOSIS — Z992 Dependence on renal dialysis: Secondary | ICD-10-CM | POA: Diagnosis not present

## 2021-10-08 NOTE — Telephone Encounter (Signed)
.. °  Medicaid Managed Care   Unsuccessful Outreach Note  10/08/2021 Name: Jeremy [name] MRN: [id number] DOB: 01/14/1981  Referred by: Rick Duff, MD Reason for referral : High Risk Managed Medicaid (I called the patient to get him scheduled with the MM BSW. I left my name and number on his VM.)   An unsuccessful telephone outreach was attempted today. The patient was referred to the case management team for assistance with care management and care coordination.   Follow Up Plan: The care management team will reach out to the patient again over the next 7 days.    [place]

## 2021-10-08 NOTE — Progress Notes (Signed)
Mr. Lua denies chest pain or shortness of breath. Patient denies having any s/s of Covid in his household.  Patient denies any known exposure to Covid.   Mr. Sada has type II diabetes, I instructed patient to take 17 units of Lantus tonight, if CBG is greater that 220 call pre surgery desk and ask if he should take any Novolog Insulin. I instructed patient to check CBG after awaking and every 2 hours until arrival  to the hospital. I Instructed patient if CBG is less than 70 to take 4 Glucose Tablets or 1 tube of Glucose Gel or 1/2 cup of a clear juice. Recheck CBG in 15 minutes if CBG is not over 70 call, pre- op desk at [phone] for further instructions.   I instructed Mr. Wageman to shower with antibiotic soap, if it is available.  Dry off with a clean towel. Do not put lotion, powder, cologne or deodorant or makeup.No jewelry or piercings. Men may shave their face and neck. Woman should not shave. No nail polish, artificial or acrylic nails. Wear clean clothes, brush your teeth. Glasses, contact lens,dentures or partials may not be worn in the OR. If you need to wear them, please bring a case for glasses, do not wear contacts or bring a case, the hospital does not have contact cases, dentures or partials will have to be removed , make sure they are clean, we will provide a denture cup to put them in. You will need some one to drive you home and a responsible person over the age of 42 to stay with you for the first 24 hours after surgery.

## 2021-10-09 ENCOUNTER — Encounter (HOSPITAL_COMMUNITY): Payer: Self-pay | Admitting: Anesthesiology

## 2021-10-09 DIAGNOSIS — N2581 Secondary hyperparathyroidism of renal origin: Secondary | ICD-10-CM | POA: Diagnosis not present

## 2021-10-09 DIAGNOSIS — N186 End stage renal disease: Secondary | ICD-10-CM | POA: Diagnosis not present

## 2021-10-09 DIAGNOSIS — Z992 Dependence on renal dialysis: Secondary | ICD-10-CM | POA: Diagnosis not present

## 2021-10-09 DIAGNOSIS — D689 Coagulation defect, unspecified: Secondary | ICD-10-CM | POA: Diagnosis not present

## 2021-10-09 NOTE — Anesthesia Preprocedure Evaluation (Deleted)
Anesthesia Evaluation    Reviewed: Allergy & Precautions, H&P , Patient's Chart, lab work & pertinent test results, reviewed documented beta blocker date and time   History of Anesthesia Complications (+) PONV and Family history of anesthesia reaction  Airway        Dental   Pulmonary neg pulmonary ROS, asthma ,           Cardiovascular Exercise Tolerance: Good hypertension, Pt. on medications and Pt. on home beta blockers +CHF  negative cardio ROS       Neuro/Psych Depression negative neurological ROS  negative psych ROS   GI/Hepatic negative GI ROS, Neg liver ROS, GERD  Medicated,  Endo/Other  negative endocrine ROSdiabetes, Insulin Dependent, Oral Hypoglycemic Agents  Renal/GU ESRF and DialysisRenal diseasenegative Renal ROS  negative genitourinary   Musculoskeletal   Abdominal   Peds  Hematology negative hematology ROS (+) Blood dyscrasia, anemia ,   Anesthesia Other Findings   Reproductive/Obstetrics negative OB ROS                             Anesthesia Physical Anesthesia Plan  ASA: 3  Anesthesia Plan: MAC and Regional   Post-op Pain Management: Tylenol PO (pre-op)   Induction: Intravenous  PONV Risk Score and Plan: 3 and Propofol infusion and Ondansetron  Airway Management Planned: Natural Airway and Simple Face Mask  Additional Equipment:   Intra-op Plan:   Post-operative Plan:   Informed Consent: I have reviewed the patients History and Physical, chart, labs and discussed the procedure including the risks, benefits and alternatives for the proposed anesthesia with the patient or authorized representative who has indicated his/her understanding and acceptance.       Plan Discussed with:   Anesthesia Plan Comments:         Anesthesia Quick Evaluation

## 2021-10-10 ENCOUNTER — Ambulatory Visit (HOSPITAL_COMMUNITY): Admission: RE | Admit: 2021-10-10 | Payer: Medicaid Other | Source: Home / Self Care | Admitting: Vascular Surgery

## 2021-10-10 HISTORY — DX: End stage renal disease: N18.6

## 2021-10-10 SURGERY — ARTERIOVENOUS (AV) FISTULA CREATION
Anesthesia: Choice | Laterality: Right

## 2021-10-11 ENCOUNTER — Other Ambulatory Visit: Payer: Self-pay

## 2021-10-11 NOTE — Patient Instructions (Signed)
Visit Information  Jeremy [name] was given information about Medicaid Managed Care team care coordination services as a part of their Healthy [facility] Medicaid benefit. Jeremy [name] verbally consented to engagement with the [facility] Managed Care team.   If you are experiencing a medical emergency, please call 911 or report to your local emergency department or urgent care.   If you have a non-emergency medical problem during routine business hours, please contact your provider's office and ask to speak with a nurse.   For questions related to your Healthy [facility] health plan, please call: [phone] or visit the homepage here: [email or web address]  If you would like to schedule transportation through your Healthy [facility] plan, please call the following number at least 2 days in advance of your appointment: [phone]  Call the [place] at [phone], at any time, 24 hours a day, 7 days a week. If you are in danger or need immediate medical attention call 911.  If you would like help to quit smoking, call 1-800-QUIT-NOW 312-445-8149) OR Espaol: 1-855-Djelo-Ya (7-062-376-2831) o para ms informacin haga clic aqu or Text READY to 200-400 to register via text  Mr. Zeck - following are the goals we discussed in your visit today:   Goals Addressed   None      Social Worker will follow up in 30 days .   Jeremy [name], BSW, [place]  High Risk Managed Medicaid Team  [phone]   Following is a copy of your plan of care:  Care Plan : [place] of Care  Updates made by Jeremy [name] since 10/11/2021 12:00 AM     Problem: Health Management Needs related to ESRD, DMII and CHTN      Long-Range Goal: Development of Plan of Care to Address Health Management Needs related to ESRD, DMII and CHTN   Start Date: 10/07/2021  Expected End Date: 01/05/2022  Priority:  High  Note:   Current Barriers:  Chronic Disease Management support and education needs related to DMII and ESRD Financial Constraints.  Jeremy [name] is managing his health. He is concerned about his finances due to having no income. He would like assistance in applying for disability and food benefits. He is attending dialysis 3 times a week on T/Th/S. He is checking his BS 3 times a day and takes all of his medications. He was last seen with his PCP 10/15/2020.  RNCM Clinical Goal(s):  Patient will verbalize understanding of plan for management of DMII and ESRD as evidenced by patient verbalization of self monitoring activities take all medications exactly as prescribed and will call provider for medication related questions as evidenced by documentation in EMR    attend all scheduled medical appointments: Dialysis on T/TH/S and calling to schedule visit with PCP as evidenced by provider documentation in EMR        work with social worker to address Financial constraints related to lack of income, Limited access to food, and Housing barriers related to the management of DMII and ESRD as evidenced by review of EMR and patient or Education officer, museum report     through collaboration with Consulting civil engineer, provider, and care team.   Interventions: Inter-disciplinary care team collaboration (see longitudinal plan of care) Evaluation of current treatment plan related to  self management and patient's adherence to plan as established by provider 10/11/21: BSW contacted patient regarding applying for disability and foodstamps. Patient states he is currently on  dialysis and unable to work. Patient informed patient he would have to got to [email or web address] to apply for disability or go into the office, patient stated he already has an appointment with disability. BSW informed patient in order to apply for foodstamps he can apply online or go down to social services on [address] to apply. No other resources needed at this time.     Chronic Kidney Disease (Status: New goal.)  Long Term Goal  Last practice recorded BP readings:  BP Readings from Last 3 Encounters:  08/12/21 (!) 185/106  07/02/21 (!) 158/86  04/22/21 (!) 167/99  Most recent eGFR/CrCl: No results found for: EGFR  No components found for: CRCL  Evaluation of current treatment plan related to chronic kidney disease self management and patient's adherence to plan as established by provider      Provided education to patient re: stroke prevention, s/s of heart attack and stroke    Reviewed medications with patient and discussed importance of compliance    Discussed plans with patient for ongoing care management follow up and provided patient with direct contact information for care management team    Assessed social determinant of health barriers      Diabetes:  (Status: New goal.) Long Term Goal   Lab Results  Component Value Date   HGBA1C 7.2 (A) 10/15/2020    Assessed patient's understanding of A1c goal: <7% Provided education to patient about basic DM disease process; Reviewed medications with patient and discussed importance of medication adherence;        Discussed plans with patient for ongoing care management follow up and provided patient with direct contact information for care management team;      Reviewed scheduled/upcoming provider appointments including: Dialysis on T/TH/S and scheduling a PCP visit, [facility] Internal Medicine [phone];         Referral made to social work team for assistance with applying for disability, housing and financial resources;      Review of patient status, including review of consultants reports, relevant laboratory and other test results, and medications completed;       Assessed social determinant of health barriers;         Patient Goals/Self-Care Activities: Take medications as prescribed   Attend all scheduled provider appointments Call pharmacy for medication refills 3-7 days in advance of  running out of medications Perform all self care activities independently  Perform IADL's (shopping, preparing meals, housekeeping, managing finances) independently Call provider office for new concerns or questions  Work with the social worker to address care coordination needs and will continue to work with the clinical team to address health care and disease management related needs check blood sugar at prescribed times: three times daily take the blood sugar meter to all doctor visits fill half of plate with vegetables manage portion size

## 2021-10-11 NOTE — Patient Outreach (Signed)
Medicaid Managed Care Social Work Note  10/11/2021 Name:  Jeremy [name] MRN:  [id number] DOB:  Dec 09, 1980  Jeremy [name] is an 41 y.o. year old male who is a primary patient of Rick Duff, MD.  The [facility] Managed Care Coordination team was consulted for assistance with:   community resources  Mr. Doyon was given information about Medicaid Managed Care Coordination team services today. [name] agreed to services and verbal consent obtained.  Engaged with patient  for by telephone forinitial visit in response to referral for case management and/or care coordination services.   Assessments/Interventions:  Review of past medical history, allergies, medications, health status, including review of consultants reports, laboratory and other test data, was performed as part of comprehensive evaluation and provision of chronic care management services.  SDOH: (Social Determinant of Health) assessments and interventions performed: BSW contacted patient regarding applying for disability and foodstamps. Patient states he is currently on dialysis and unable to work. Patient informed patient he would have to got to [facility].gov to apply for disability or go into the office, patient stated he already has an appointment with disability. BSW informed patient in order to apply for foodstamps he can apply online or go down to social services on [address] to apply. No other resources needed at this time.   Advanced Directives Status:  Not addressed in this encounter.  Care Plan                 Allergies  Allergen Reactions   Acyclovir And Related Hives    Medications Reviewed Today     Reviewed by Merceda Elks, CPhT (Pharmacy Technician) on 10/08/21 at 1643  Med List Status: Complete   Medication Order Taking? Sig Documenting Provider Last Dose Status Informant  Accu-Chek FastClix Lancets MISC [id number]  Check your blood sugars 4 times a day. Katherine Roan, MD  Active Self   ACCU-CHEK GUIDE test strip [id number]  USE TO CHECK BLOOD SUGAR 4 TIMES DAILY Katherine Roan, MD  Active Self  amLODipine (NORVASC) 10 MG tablet [id number] Yes Take 1 tablet (10 mg total) by mouth daily.  Patient taking differently: Take 10 mg by mouth every evening.   Cato Mulligan, MD  Active Self  atorvastatin (LIPITOR) 80 MG tablet [id number] Yes Take 1 tablet (80 mg total) by mouth daily. Bensimhon, Shaune Pascal, MD  Active Self  AURYXIA 1 GM 210 MG(Fe) tablet [id number] Yes Take 420 mg by mouth 3 (three) times daily. [provider]  Active Self  blood glucose meter kit and supplies KIT [id number]  Dispense based on patient and insurance preference. Use up to four times daily as directed. (FOR ICD-9 250.00, 250.01). Katherine Roan, MD  Active Self  calcitRIOL (ROCALTROL) 0.25 MCG capsule [id number] No Take 1 capsule (0.25 mcg total) by mouth daily.  Patient not taking: Reported on 10/07/2021    Not Taking Active Self           Med Note Wilmon Pali, MELISSA R   Tue Oct 08, 2021  4:37 PM)    carvedilol (COREG) 25 MG tablet [id number] Yes Take 1.5 tablets (37.5 mg total) by mouth 2 (two) times daily with a meal. [name], MD  Active Self  cephALEXin (KEFLEX) 500 MG capsule [id number] No Take 1 capsule (500 mg total) by mouth 2 (two) times daily.  Patient not taking: Reported on 08/06/2021    Not Taking Active Self  cloNIDine (CATAPRES) 0.3 MG tablet [id number] Yes Take 1 tablet (  0.3 mg total) by mouth 2 (two) times daily. Laurey Morale, MD  Active Self  cloNIDine (CATAPRES) 0.3 MG tablet [id number] No Take 1 tablet (0.3 mg total) by mouth 2 (two) times daily as directed  Patient not taking: Reported on 10/08/2021    Not Taking Active Self  glipiZIDE (GLUCOTROL) 5 MG tablet [id number] Yes Take 1 tablet (5 mg total) by mouth daily. Belva Agee, MD  Active Self  hydrALAZINE (APRESOLINE) 100 MG tablet [id number] Yes Take 1 tablet (100 mg total) by mouth 3 (three) times  daily. Bensimhon, Bevelyn Buckles, MD  Active Self  insulin aspart (NOVOLOG) 100 UNIT/ML FlexPen [id number] Yes Inject 15 Units into the skin 3 (three) times daily with meals.  Patient taking differently: Inject 17 Units into the skin 3 (three) times daily with meals.   Doran Stabler, DO  Active Self           Med Note Gunnar Fusi, MELISSA R   Tue Oct 08, 2021  4:37 PM)    insulin glargine (LANTUS) 100 unit/mL SOPN [id number] Yes Inject 37 Units into the skin at bedtime. Doran Stabler, DO  Active Self  Insulin Pen Needle (PEN NEEDLES) 31G X 5 MM MISC [id number]  use four times daily with insulin Doran Stabler, DO  Active Self  Insulin Syringe-Needle U-100 (ULTICARE INSULIN SYRINGE) 30G X 1/2" 0.5 ML MISC [id number]  USE 4 TIMES DAILY WITH INSULIN INJECTIONS. Quincy Simmonds, MD  Active Self  isosorbide mononitrate (IMDUR) 30 MG 24 hr tablet [id number] Yes TAKE 1 TABLET BY MOUTH DAILY Julien Nordmann, PA-C  Active Self  losartan (COZAAR) 50 MG tablet [id number] No Take 1 tablet (50 mg total) by mouth every evening.  Patient not taking: Reported on 10/08/2021    Not Taking Active Self  multivitamin (RENA-VIT) TABS tablet [id number] Yes Take 1 tablet by mouth daily. [provider]  Active Self  nitroGLYCERIN (NITROSTAT) 0.4 MG SL tablet [id number] Yes PLACE 1 TABLET UNDER THE TONGUE EVERY 5 MINUTES AS NEEDED FOR CHEST PAIN. Laurey Morale, MD  Active Self           Med Note Donalynn Furlong   Tue Jul 02, 2021  8:15 PM)    ondansetron (ZOFRAN ODT) 4 MG disintegrating tablet [id number] Yes Take 1 tablet (4 mg total) by mouth every 8 (eight) hours as needed for nausea or vomiting. Smoot, Shawn Route, PA-C  Active Self  oxyCODONE-acetaminophen (PERCOCET/ROXICET) 5-325 MG tablet [id number] No Take 1 tablet by mouth every 6 (six) hours as needed for severe pain.  Patient not taking: Reported on 08/06/2021   Vear Clock Not Taking Active Self  RENVELA 800 MG tablet [id number] Yes Take 1,600 mg by mouth 3  (three) times daily with meals. [provider]  Active Self  sodium bicarbonate 650 MG tablet [id number] No Take 2 tablets (1,300 mg total) by mouth 3 (three) times daily.  Patient not taking: Reported on 08/06/2021    Not Taking Active Self            Patient Active Problem List   Diagnosis Date Noted   Unspecified protein-calorie malnutrition (HCC) 10/27/2019   Anemia, unspecified 10/26/2019   Acute kidney failure, unspecified (HCC) 10/21/2019   Coagulation defect, unspecified (HCC) 10/21/2019   Personal history of COVID-19 10/21/2019   Secondary hyperparathyroidism of renal origin (HCC) 10/21/2019   COVID-19 virus infection    Mass of soft tissue of face 09/23/2018   Onychomycosis due to dermatophyte 03/29/2018  Anemia, iron deficiency 07/18/2016   Anemia in chronic kidney disease 07/18/2016   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, stage 5 ([place]) 05/29/2016   Suspected sleep apnea 05/24/2015   Chronic combined systolic and diastolic CHF (congestive heart failure) ([place]) 03/01/2015   Nonischemic cardiomyopathy ([place])    Chest pain 01/09/2015   Asthma    Hyperlipidemia 03/31/2012   Obesity, Class I, BMI 30-34.9 03/21/2011   Essential hypertension 03/21/2011   Gastroesophageal reflux disease 03/21/2011   Type 2 diabetes mellitus with diabetic nephropathy ([place]) 02/20/2011    Conditions to be addressed/monitored per PCP order:   community resources  Care Plan : RN Care Manager Plan of Care  Updates made by Ethelda Chick since 10/11/2021 12:00 AM     Problem: Health Management Needs related to ESRD, DMII and CHTN      Long-Range Goal: Development of Plan of Care to Address Health Management Needs related to ESRD, DMII and CHTN   Start Date: 10/07/2021  Expected End Date: 01/05/2022  Priority: High  Note:   Current Barriers:  Chronic Disease Management support and education needs related to DMII and ESRD Financial Constraints.   Mr. Regala is managing his health. He is concerned about his finances due to having no income. He would like assistance in applying for disability and food benefits. He is attending dialysis 3 times a week on T/Th/S. He is checking his BS 3 times a day and takes all of his medications. He was last seen with his PCP 10/15/2020.  RNCM Clinical Goal(s):  Patient will verbalize understanding of plan for management of DMII and ESRD as evidenced by patient verbalization of self monitoring activities take all medications exactly as prescribed and will call provider for medication related questions as evidenced by documentation in EMR    attend all scheduled medical appointments: Dialysis on T/TH/S and calling to schedule visit with PCP as evidenced by provider documentation in EMR        work with social worker to address Financial constraints related to lack of income, Limited access to food, and Housing barriers related to the management of DMII and ESRD as evidenced by review of EMR and patient or Education officer, museum report     through collaboration with Consulting civil engineer, provider, and care team.   Interventions: Inter-disciplinary care team collaboration (see longitudinal plan of care) Evaluation of current treatment plan related to  self management and patient's adherence to plan as established by provider 10/11/21: BSW contacted patient regarding applying for disability and foodstamps. Patient states he is currently on dialysis and unable to work. Patient informed patient he would have to got to [email or web address] to apply for disability or go into the office, patient stated he already has an appointment with disability. BSW informed patient in order to apply for foodstamps he can apply online or go down to social services on [address] to apply. No other resources needed at this time.    Chronic Kidney Disease (Status: New goal.)  Long Term Goal  Last practice recorded BP readings:  BP Readings from Last 3 Encounters:   08/12/21 (!) 185/106  07/02/21 (!) 158/86  04/22/21 (!) 167/99  Most recent eGFR/CrCl: No results found for: EGFR  No components found for: CRCL  Evaluation of current treatment plan related to chronic kidney disease self management and patient's adherence to plan as established by provider      Provided education to patient re: stroke prevention, s/s of heart attack and  stroke    Reviewed medications with patient and discussed importance of compliance    Discussed plans with patient for ongoing care management follow up and provided patient with direct contact information for care management team    Assessed social determinant of health barriers      Diabetes:  (Status: New goal.) Long Term Goal   Lab Results  Component Value Date   HGBA1C 7.2 (A) 10/15/2020    Assessed patient's understanding of A1c goal: <7% Provided education to patient about basic DM disease process; Reviewed medications with patient and discussed importance of medication adherence;        Discussed plans with patient for ongoing care management follow up and provided patient with direct contact information for care management team;      Reviewed scheduled/upcoming provider appointments including: Dialysis on T/TH/S and scheduling a PCP visit, [facility] [phone];         Referral made to social work team for assistance with applying for disability, housing and financial resources;      Review of patient status, including review of consultants reports, relevant laboratory and other test results, and medications completed;       Assessed social determinant of health barriers;         Patient Goals/Self-Care Activities: Take medications as prescribed   Attend all scheduled provider appointments Call pharmacy for medication refills 3-7 days in advance of running out of medications Perform all self care activities independently  Perform IADL's (shopping, preparing meals, housekeeping,  managing finances) independently Call provider office for new concerns or questions  Work with the social worker to address care coordination needs and will continue to work with the clinical team to address health care and disease management related needs check blood sugar at prescribed times: three times daily take the blood sugar meter to all doctor visits fill half of plate with vegetables manage portion size       Follow up:  Patient agrees to Care Plan and Follow-up.  Plan: The Managed Medicaid care management team will reach out to the patient again over the next 30 days.  Date/time of next scheduled Social Work care management/care coordination outreach:  11/08/21  Mickel Fuchs, Arita Miss, [place] Medicaid Team  [phone]

## 2021-10-12 DIAGNOSIS — D689 Coagulation defect, unspecified: Secondary | ICD-10-CM | POA: Diagnosis not present

## 2021-10-12 DIAGNOSIS — N2581 Secondary hyperparathyroidism of renal origin: Secondary | ICD-10-CM | POA: Diagnosis not present

## 2021-10-12 DIAGNOSIS — N186 End stage renal disease: Secondary | ICD-10-CM | POA: Diagnosis not present

## 2021-10-12 DIAGNOSIS — Z992 Dependence on renal dialysis: Secondary | ICD-10-CM | POA: Diagnosis not present

## 2021-10-14 ENCOUNTER — Other Ambulatory Visit (HOSPITAL_COMMUNITY): Payer: Self-pay

## 2021-10-15 DIAGNOSIS — D509 Iron deficiency anemia, unspecified: Secondary | ICD-10-CM | POA: Diagnosis not present

## 2021-10-15 DIAGNOSIS — Z992 Dependence on renal dialysis: Secondary | ICD-10-CM | POA: Diagnosis not present

## 2021-10-15 DIAGNOSIS — N2581 Secondary hyperparathyroidism of renal origin: Secondary | ICD-10-CM | POA: Diagnosis not present

## 2021-10-15 DIAGNOSIS — D689 Coagulation defect, unspecified: Secondary | ICD-10-CM | POA: Diagnosis not present

## 2021-10-15 DIAGNOSIS — N186 End stage renal disease: Secondary | ICD-10-CM | POA: Diagnosis not present

## 2021-10-19 DIAGNOSIS — N2581 Secondary hyperparathyroidism of renal origin: Secondary | ICD-10-CM | POA: Diagnosis not present

## 2021-10-19 DIAGNOSIS — D689 Coagulation defect, unspecified: Secondary | ICD-10-CM | POA: Diagnosis not present

## 2021-10-19 DIAGNOSIS — N186 End stage renal disease: Secondary | ICD-10-CM | POA: Diagnosis not present

## 2021-10-19 DIAGNOSIS — D509 Iron deficiency anemia, unspecified: Secondary | ICD-10-CM | POA: Diagnosis not present

## 2021-10-19 DIAGNOSIS — Z992 Dependence on renal dialysis: Secondary | ICD-10-CM | POA: Diagnosis not present

## 2021-10-21 ENCOUNTER — Other Ambulatory Visit: Payer: Self-pay

## 2021-10-29 ENCOUNTER — Encounter: Payer: Self-pay | Admitting: Student

## 2021-10-29 NOTE — Telephone Encounter (Signed)
Can we get patient scheduled to be seen? If no availability, would recommend him to urgent care.

## 2021-10-29 NOTE — Telephone Encounter (Signed)
RTC to patient.  Advised to go to Urgent Care for his tooth Abscess.  Patient agreed to go.  Would still like to be seen in the Clinics on Wednesday if possible.

## 2021-10-29 NOTE — Telephone Encounter (Signed)
Sure, we can still schedule him to follow up on Wednesday

## 2021-10-31 ENCOUNTER — Other Ambulatory Visit: Payer: Self-pay

## 2021-10-31 DIAGNOSIS — D689 Coagulation defect, unspecified: Secondary | ICD-10-CM | POA: Diagnosis not present

## 2021-10-31 DIAGNOSIS — N2581 Secondary hyperparathyroidism of renal origin: Secondary | ICD-10-CM | POA: Diagnosis not present

## 2021-10-31 DIAGNOSIS — N186 End stage renal disease: Secondary | ICD-10-CM | POA: Diagnosis not present

## 2021-10-31 DIAGNOSIS — Z992 Dependence on renal dialysis: Secondary | ICD-10-CM | POA: Diagnosis not present

## 2021-10-31 DIAGNOSIS — D509 Iron deficiency anemia, unspecified: Secondary | ICD-10-CM | POA: Diagnosis not present

## 2021-11-02 DIAGNOSIS — N2581 Secondary hyperparathyroidism of renal origin: Secondary | ICD-10-CM | POA: Diagnosis not present

## 2021-11-02 DIAGNOSIS — D509 Iron deficiency anemia, unspecified: Secondary | ICD-10-CM | POA: Diagnosis not present

## 2021-11-02 DIAGNOSIS — D689 Coagulation defect, unspecified: Secondary | ICD-10-CM | POA: Diagnosis not present

## 2021-11-02 DIAGNOSIS — Z992 Dependence on renal dialysis: Secondary | ICD-10-CM | POA: Diagnosis not present

## 2021-11-02 DIAGNOSIS — N186 End stage renal disease: Secondary | ICD-10-CM | POA: Diagnosis not present

## 2021-11-04 ENCOUNTER — Ambulatory Visit: Payer: Self-pay

## 2021-11-05 DIAGNOSIS — N186 End stage renal disease: Secondary | ICD-10-CM | POA: Diagnosis not present

## 2021-11-05 DIAGNOSIS — E1129 Type 2 diabetes mellitus with other diabetic kidney complication: Secondary | ICD-10-CM | POA: Diagnosis not present

## 2021-11-05 DIAGNOSIS — Z992 Dependence on renal dialysis: Secondary | ICD-10-CM | POA: Diagnosis not present

## 2021-11-06 ENCOUNTER — Telehealth: Payer: Self-pay | Admitting: Student

## 2021-11-06 DIAGNOSIS — N2581 Secondary hyperparathyroidism of renal origin: Secondary | ICD-10-CM | POA: Diagnosis not present

## 2021-11-06 DIAGNOSIS — Z992 Dependence on renal dialysis: Secondary | ICD-10-CM | POA: Diagnosis not present

## 2021-11-06 DIAGNOSIS — D509 Iron deficiency anemia, unspecified: Secondary | ICD-10-CM | POA: Diagnosis not present

## 2021-11-06 DIAGNOSIS — D689 Coagulation defect, unspecified: Secondary | ICD-10-CM | POA: Diagnosis not present

## 2021-11-06 DIAGNOSIS — N186 End stage renal disease: Secondary | ICD-10-CM | POA: Diagnosis not present

## 2021-11-07 ENCOUNTER — Encounter: Payer: Self-pay | Admitting: Student

## 2021-11-07 ENCOUNTER — Encounter: Payer: Medicaid Other | Admitting: Internal Medicine

## 2021-11-08 ENCOUNTER — Other Ambulatory Visit: Payer: Self-pay | Admitting: Student

## 2021-11-08 ENCOUNTER — Other Ambulatory Visit: Payer: Self-pay

## 2021-11-08 ENCOUNTER — Other Ambulatory Visit (HOSPITAL_COMMUNITY): Payer: Self-pay

## 2021-11-08 ENCOUNTER — Other Ambulatory Visit: Payer: Self-pay | Admitting: *Deleted

## 2021-11-08 NOTE — Patient Outreach (Signed)
Care Coordination ? ?11/08/2021 ? ?[name] ?09-19-1980 ?[id number] ? ? ? ?Medicaid Managed Care  ? ?Unsuccessful Outreach Note ? ?11/08/2021 ?Name: Jeremy [name] MRN: [id number] DOB: 1981-07-09 ? ?Referred by: Rick Duff, MD ?Reason for referral : High Risk Managed Medicaid (Unsuccessful RNCM follow up telephone outreach) ? ? ?An unsuccessful telephone outreach was attempted today. The patient was referred to the case management team for assistance with care management and care coordination.  ? ?Follow Up Plan: The care management team will reach out to the patient again over the next 14 days.  ? ?Lurena Joiner RN, BSN ?[place] ?RN Care Coordinator ? ? ?

## 2021-11-08 NOTE — Patient Instructions (Signed)
Visit Information ? ?Jeremy [name] was given information about Medicaid Managed Care team care coordination services as a part of their [employer] Medicaid benefit. Jeremy [name] verbally consented to engagement with the [facility] Managed Care team.  ? ?If you are experiencing a medical emergency, please call 911 or report to your local emergency department or urgent care.  ? ?If you have a non-emergency medical problem during routine business hours, please contact your provider's office and ask to speak with a nurse.  ? ?For questions related to your Healthy [facility] health plan, please call: [phone] or visit the homepage here: [email or web address] ? ?If you would like to schedule transportation through your Healthy [facility] plan, please call the following number at least 2 days in advance of your appointment: [phone] ? For information about your ride after you set it up, call Ride Assist at [phone]. Use this number to activate a Will Call pickup, or if your transportation is late for a scheduled pickup. Use this number, too, if you need to make a change or cancel a previously scheduled reservation. ? If you need transportation services right away, call [phone]. The after-hours call center is staffed 24 hours to handle ride assistance and urgent reservation requests (including discharges) 365 days a year. Urgent trips include sick visits, hospital discharge requests and life-sustaining treatment. ? ?Call the [place] at [phone], at any time, 24 hours a [name], 7 days a week. If you are in danger or need immediate medical attention call 911. ? ?If you would like help to quit smoking, call 1-800-QUIT-NOW 864 065 7962) OR Espa?ol: 1-855-D?jelo-Ya (612) 685-9736) o para m?s informaci?n haga clic aqu? or Text READY to 200-400 to register via text ? ?[name] - following are the goals we discussed in your visit today:  ? Goals  Addressed   ?None ?  ? ? ? ?The  Patient                                              has been provided with contact information for the Managed Medicaid care management team and has been advised to call with any health related questions or concerns.  ? ?Jeremy [name], BSW, MHA ?[place]  ?High Risk Managed Medicaid Team  ?[phone]  ? ?Following is a copy of your plan of care:  ?Care Plan : [place] of Care  ?Updates made by Jeremy [name] since 11/08/2021 12:00 AM  ?  ? ?Problem: Health Management Needs related to ESRD, DMII and CHTN   ?  ? ?Long-Range Goal: Development of Plan of Care to Address Health Management Needs related to ESRD, DMII and CHTN   ?Start Date: 10/07/2021  ?Expected End Date: 01/05/2022  ?Priority: High  ?Note:   ?Current Barriers:  ?Chronic Disease Management support and education needs related to DMII and ESRD ?Film/video editor.  ?[name] is managing his health. He is concerned about his finances due to having no income. He would like assistance in applying for disability and food benefits. He is attending dialysis 3 times a week on T/Th/S. He is checking his BS 3 times a [name] and takes all of his medications. He was last seen with his PCP 10/15/2020. ? ?RNCM Clinical Goal(s):  ?Patient will verbalize understanding of plan for management of DMII and ESRD as evidenced by  patient verbalization of self monitoring activities ?take all medications exactly as prescribed and will call provider for medication related questions as evidenced by documentation in EMR    ?attend all scheduled medical appointments: Dialysis on T/TH/S and calling to schedule visit with PCP as evidenced by provider documentation in EMR        ?work with Education officer, museum to Civil Service fast streamer constraints related to lack of income, Limited access to food, and Housing barriers related to the management of DMII and ESRD as evidenced by review of EMR and patient or Education officer, museum  report     through collaboration with Consulting civil engineer, provider, and care team.  ? ?Interventions: ?Inter-disciplinary care team collaboration (see longitudinal plan of care) ?Evaluation of current treatment plan related to  self management and patient's adherence to plan as established by provider ?10/11/21: BSW contacted patient regarding applying for disability and foodstamps. Patient states he is currently on dialysis and unable to work. Patient informed patient he would have to got to [facility].gov to apply for disability or go into the office, patient stated he already has an appointment with disability. BSW informed patient in order to apply for foodstamps he can apply online or go down to social services on [address] to apply. No other resources needed at this time.  ?11/08/21: BSW completed follow up with patient. He states he has not had his appointment with disability yet and he has not applied for foodstamps yet. No other resources needed at this time. ? ? ?Chronic Kidney Disease (Status: New goal.)  Long Term Goal  ?Last practice recorded BP readings:  ?BP Readings from Last 3 Encounters:  ?08/12/21 (!) 185/106  ?07/02/21 (!) 158/86  ?04/22/21 (!) 167/99  ?Most recent eGFR/CrCl: No results found for: EGFR  No components found for: CRCL ? ?Evaluation of current treatment plan related to chronic kidney disease self management and patient's adherence to plan as established by provider      ?Provided education to patient re: stroke prevention, s/s of heart attack and stroke    ?Reviewed medications with patient and discussed importance of compliance    ?Discussed plans with patient for ongoing care management follow up and provided patient with direct contact information for care management team    ?Assessed social determinant of health barriers    ? ? ?Diabetes:  (Status: New goal.) Long Term Goal  ? ?Lab Results  ?Component Value Date  ? HGBA1C 7.2 (A) 10/15/2020  ?  ?Assessed patient's understanding of A1c goal:  <7% ?Provided education to patient about basic DM disease process; ?Reviewed medications with patient and discussed importance of medication adherence;        ?Discussed plans with patient for ongoing care management follow up and provided patient with direct contact information for care management team;      ?Reviewed scheduled/upcoming provider appointments including: Dialysis on T/TH/S and scheduling a PCP visit, [facility] [phone];         ?Referral made to social work team for assistance with applying for disability, housing and financial resources;      ?Review of patient status, including review of consultants reports, relevant laboratory and other test results, and medications completed;       ?Assessed social determinant of health barriers;        ? ?Patient Goals/Self-Care Activities: ?Take medications as prescribed   ?Attend all scheduled provider appointments ?Call pharmacy for medication refills 3-7 days in advance of running out of medications ?Perform all  self care activities independently  ?Perform IADL's (shopping, preparing meals, housekeeping, managing finances) independently ?Call provider office for new concerns or questions  ?Work with the Education officer, museum to address care coordination needs and will continue to work with the clinical team to address health care and disease management related needs ?check blood sugar at prescribed times: three times daily ?take the blood sugar meter to all doctor visits ?fill half of plate with vegetables ?manage portion size ? ? ?  ? ?  ?

## 2021-11-08 NOTE — Patient Instructions (Signed)
Visit Information ? ?[name]  - as a part of your Medicaid benefit, you are eligible for care management and care coordination services at no cost or copay. I was unable to reach you by phone today but would be happy to help you with your health related needs. Please feel free to call me @ [phone].  ? ?A member of the Managed Medicaid care management team will reach out to you again over the next 14 days.  ? ?Lurena Joiner RN, BSN ?[place] ?RN Care Coordinator ?  ?

## 2021-11-08 NOTE — Patient Outreach (Signed)
Medicaid Managed Care Social Work Note  11/08/2021 Name:  Jeremy [name] MRN:  [id number] DOB:  26-Jan-1981  Jeremy [name] is an 41 y.o. year old male who is a primary patient of Jeremy Duff, [name].  The Medicaid Managed Care Coordination team was consulted for assistance with:  Community Resources   Jeremy [name] was given information about Medicaid Managed Care Coordination team services today. Jeremy [name] Patient agreed to services and verbal consent obtained.  Engaged with patient  for by telephone forfollow up visit in response to referral for case management and/or care coordination services.   Assessments/Interventions:  Review of past medical history, allergies, medications, health status, including review of consultants reports, laboratory and other test data, was performed as part of comprehensive evaluation and provision of chronic care management services.  SDOH: (Social Determinant of Health) assessments and interventions performed: BSW completed follow up with patient. He states he has not had his appointment with disability yet and he has not applied for foodstamps yet. No other resources needed at this time.  Advanced Directives Status:  Not addressed in this encounter.  Care Plan                 Allergies  Allergen Reactions   Acyclovir And Related Hives    Medications Reviewed Today     Reviewed by Jeremy [name] (Pharmacy Technician) on 10/08/21 at 1643  Med List Status: Complete   Medication Order Taking? Sig Documenting Provider Last Dose Status Informant  Accu-Chek FastClix Lancets MISC [id number]  Check your blood sugars 4 times a day. Jeremy [name]  Active Self  ACCU-CHEK GUIDE test strip [id number]  USE TO CHECK BLOOD SUGAR 4 TIMES DAILY Jeremy [name]  Active Self  amLODipine (NORVASC) 10 MG tablet [id number] Yes Take 1 tablet (10 mg total) by mouth daily.  Patient taking differently: Take 10 mg by mouth every evening.    Jeremy [name]  Active Self  atorvastatin (LIPITOR) 80 MG tablet [id number] Yes Take 1 tablet (80 mg total) by mouth daily. Bensimhon, Shaune Pascal, [name]  Active Self  AURYXIA 1 GM 210 MG(Fe) tablet [id number] Yes Take 420 mg by mouth 3 (three) times daily. Jeremy [name]  Active Self  blood glucose meter kit and supplies KIT [id number]  Dispense based on patient and insurance preference. Use up to four times daily as directed. (FOR ICD-9 250.00, 250.01). Jeremy [name]  Active Self  calcitRIOL (ROCALTROL) 0.25 MCG capsule [id number] No Take 1 capsule (0.25 mcg total) by mouth daily.  Patient not taking: Reported on 10/07/2021    Not Taking Active Self           Med Note Jeremy [name]   Tue Oct 08, 2021  4:37 PM)    carvedilol (COREG) 25 MG tablet [id number] Yes Take 1.5 tablets (37.5 mg total) by mouth 2 (two) times daily with a meal. Jeremy Dresser, [name]  Active Self  cephALEXin (KEFLEX) 500 MG capsule [id number] No Take 1 capsule (500 mg total) by mouth 2 (two) times daily.  Patient not taking: Reported on 08/06/2021    Not Taking Active Self  cloNIDine (CATAPRES) 0.3 MG tablet [id number] Yes Take 1 tablet (0.3 mg total) by mouth 2 (two) times daily. Jeremy Dresser, [name]  Active Self  cloNIDine (CATAPRES) 0.3 MG tablet [id number] No Take 1 tablet (0.3 mg total) by mouth 2 (two) times daily as directed  Patient not taking: Reported on 10/08/2021  Not Taking Active Self  glipiZIDE (GLUCOTROL) 5 MG tablet [id number] Yes Take 1 tablet (5 mg total) by mouth daily. Jeremy Pope, [name]  Active Self  hydrALAZINE (APRESOLINE) 100 MG tablet [id number] Yes Take 1 tablet (100 mg total) by mouth 3 (three) times daily. Bensimhon, Shaune Pascal, [name]  Active Self  insulin aspart (NOVOLOG) 100 UNIT/ML FlexPen [id number] Yes Inject 15 Units into the skin 3 (three) times daily with meals.  Patient taking differently: Inject 17 Units into the skin 3 (three) times daily with meals.   Jeremy [name]   Active Self           Med Note Jeremy [name]   Tue Oct 08, 2021  4:37 PM)    insulin glargine (LANTUS) 100 unit/mL SOPN [id number] Yes Inject 37 Units into the skin at bedtime. Jeremy [name]  Active Self  Insulin Pen Needle (PEN NEEDLES) 31G X 5 MM MISC [id number]  use four times daily with insulin Jeremy [name]  Active Self  Insulin Syringe-Needle U-100 (ULTICARE INSULIN SYRINGE) 30G X 1/2" 0.5 ML MISC [id number]  USE 4 TIMES DAILY WITH INSULIN INJECTIONS. Jeremy Beard, [name]  Active Self  isosorbide mononitrate (IMDUR) 30 MG 24 hr tablet [id number] Yes TAKE 1 TABLET BY MOUTH DAILY Jeremy [name]  Active Self  losartan (COZAAR) 50 MG tablet [id number] No Take 1 tablet (50 mg total) by mouth every evening.  Patient not taking: Reported on 10/08/2021    Not Taking Active Self  multivitamin (RENA-VIT) TABS tablet [id number] Yes Take 1 tablet by mouth daily. Jeremy [name]  Active Self  nitroGLYCERIN (NITROSTAT) 0.4 MG SL tablet [id number] Yes PLACE 1 TABLET UNDER THE TONGUE EVERY 5 MINUTES AS NEEDED FOR CHEST PAIN. Jeremy Dresser, [name]  Active Self           Med Note Jeremy [name]   Tue Jul 02, 2021  8:15 PM)    ondansetron (ZOFRAN ODT) 4 MG disintegrating tablet [id number] Yes Take 1 tablet (4 mg total) by mouth every 8 (eight) hours as needed for nausea or vomiting. Smoot, Leary [name], [name]  Active Self  oxyCODONE-acetaminophen (PERCOCET/ROXICET) 5-325 MG tablet [id number] No Take 1 tablet by mouth every 6 (six) hours as needed for severe pain.  Patient not taking: Reported on 08/06/2021   Jeremy [name] Not Taking Active Self  RENVELA 800 MG tablet [id number] Yes Take 1,600 mg by mouth 3 (three) times daily with meals. Jeremy [name]  Active Self  sodium bicarbonate 650 MG tablet [id number] No Take 2 tablets (1,300 mg total) by mouth 3 (three) times daily.  Patient not taking: Reported on 08/06/2021    Not Taking Active Self            Patient Active  Problem List   Diagnosis Date Noted   Unspecified protein-calorie malnutrition ([place]) 10/27/2019   Anemia, unspecified 10/26/2019   Acute kidney failure, unspecified ([place]) 10/21/2019   Coagulation defect, unspecified ([place]) 10/21/2019   Personal history of COVID-19 10/21/2019   Secondary hyperparathyroidism of renal origin ([place]) 10/21/2019   COVID-19 virus infection    Mass of soft tissue of face 09/23/2018   Onychomycosis due to dermatophyte 03/29/2018   Anemia, iron deficiency 07/18/2016   Anemia in chronic kidney disease 07/18/2016   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, stage 5 ([place]) 05/29/2016   Suspected sleep apnea 05/24/2015   Chronic combined systolic and diastolic CHF (congestive heart failure) ([place]) 03/01/2015  Nonischemic cardiomyopathy ([place])    Chest pain 01/09/2015   Asthma    Hyperlipidemia 03/31/2012   Obesity, Class I, BMI 30-34.9 03/21/2011   Essential hypertension 03/21/2011   Gastroesophageal reflux disease 03/21/2011   Type 2 diabetes mellitus with diabetic nephropathy ([place]) 02/20/2011    Conditions to be addressed/monitored per PCP order:   community resources  Care Plan : [place] [facility] of Care  Updates made by Ethelda Chick since 11/08/2021 12:00 AM     Problem: Health Management Needs related to ESRD, DMII and CHTN      Long-Range Goal: Development of Plan of Care to Address Health Management Needs related to ESRD, DMII and CHTN   Start Date: 10/07/2021  Expected End Date: 01/05/2022  Priority: High  Note:   Current Barriers:  Chronic Disease Management support and education needs related to DMII and ESRD Financial Constraints.  Jeremy [name] is managing his health. He is concerned about his finances due to having no income. He would like assistance in applying for disability and food benefits. He is attending dialysis 3 times a week on T/Th/S. He is checking his BS 3 times a day and takes all of his medications.  He was last seen with his PCP 10/15/2020.  RNCM Clinical Goal(s):  Patient will verbalize understanding of plan for management of DMII and ESRD as evidenced by patient verbalization of self monitoring activities take all medications exactly as prescribed and will call provider for medication related questions as evidenced by documentation in EMR    attend all scheduled medical appointments: Dialysis on T/TH/S and calling to schedule visit with PCP as evidenced by provider documentation in EMR        work with social worker to address Financial constraints related to lack of income, Limited access to food, and Housing barriers related to the management of DMII and ESRD as evidenced by review of EMR and patient or Education officer, museum report     through collaboration with Consulting civil engineer, provider, and care team.   Interventions: Inter-disciplinary care team collaboration (see longitudinal plan of care) Evaluation of current treatment plan related to  self management and patient's adherence to plan as established by provider 10/11/21: BSW contacted patient regarding applying for disability and foodstamps. Patient states he is currently on dialysis and unable to work. Patient informed patient he would have to got to [email or web address] to apply for disability or go into the office, patient stated he already has an appointment with disability. BSW informed patient in order to apply for foodstamps he can apply online or go down to social services on [address] to apply. No other resources needed at this time.  11/08/21: BSW completed follow up with patient. He states he has not had his appointment with disability yet and he has not applied for foodstamps yet. No other resources needed at this time.   Chronic Kidney Disease (Status: New goal.)  Long Term Goal  Last practice recorded BP readings:  BP Readings from Last 3 Encounters:  08/12/21 (!) 185/106  07/02/21 (!) 158/86  04/22/21 (!) 167/99  Most recent eGFR/CrCl: No  results found for: EGFR  No components found for: CRCL  Evaluation of current treatment plan related to chronic kidney disease self management and patient's adherence to plan as established by provider      Provided education to patient re: stroke prevention, s/s of heart attack and stroke    Reviewed medications with patient and discussed importance of compliance  Discussed plans with patient for ongoing care management follow up and provided patient with direct contact information for care management team    Assessed social determinant of health barriers      Diabetes:  (Status: New goal.) Long Term Goal   Lab Results  Component Value Date   HGBA1C 7.2 (A) 10/15/2020    Assessed patient's understanding of A1c goal: <7% Provided education to patient about basic DM disease process; Reviewed medications with patient and discussed importance of medication adherence;        Discussed plans with patient for ongoing care management follow up and provided patient with direct contact information for care management team;      Reviewed scheduled/upcoming provider appointments including: Dialysis on T/TH/S and scheduling a PCP visit, [facility] [phone];         Referral made to social work team for assistance with applying for disability, housing and financial resources;      Review of patient status, including review of consultants reports, relevant laboratory and other test results, and medications completed;       Assessed social determinant of health barriers;         Patient Goals/Self-Care Activities: Take medications as prescribed   Attend all scheduled provider appointments Call pharmacy for medication refills 3-7 days in advance of running out of medications Perform all self care activities independently  Perform IADL's (shopping, preparing meals, housekeeping, managing finances) independently Call provider office for new concerns or questions  Work with the  social worker to address care coordination needs and will continue to work with the clinical team to address health care and disease management related needs check blood sugar at prescribed times: three times daily take the blood sugar meter to all doctor visits fill half of plate with vegetables manage portion size       Follow up:  Patient agrees to Care Plan and Follow-up.  Plan: The  Patient has been provided with contact information for the Managed Medicaid care management team and has been advised to call with any health related questions or concerns.    Mickel Fuchs, BSW, [place] Managed Medicaid Team  [phone]

## 2021-11-09 DIAGNOSIS — D509 Iron deficiency anemia, unspecified: Secondary | ICD-10-CM | POA: Diagnosis not present

## 2021-11-09 DIAGNOSIS — D689 Coagulation defect, unspecified: Secondary | ICD-10-CM | POA: Diagnosis not present

## 2021-11-09 DIAGNOSIS — N186 End stage renal disease: Secondary | ICD-10-CM | POA: Diagnosis not present

## 2021-11-09 DIAGNOSIS — Z992 Dependence on renal dialysis: Secondary | ICD-10-CM | POA: Diagnosis not present

## 2021-11-09 DIAGNOSIS — N2581 Secondary hyperparathyroidism of renal origin: Secondary | ICD-10-CM | POA: Diagnosis not present

## 2021-11-11 ENCOUNTER — Other Ambulatory Visit: Payer: Self-pay | Admitting: Student

## 2021-11-11 ENCOUNTER — Other Ambulatory Visit (HOSPITAL_COMMUNITY): Payer: Self-pay

## 2021-11-12 ENCOUNTER — Other Ambulatory Visit (HOSPITAL_COMMUNITY): Payer: Self-pay

## 2021-11-12 DIAGNOSIS — D689 Coagulation defect, unspecified: Secondary | ICD-10-CM | POA: Diagnosis not present

## 2021-11-12 DIAGNOSIS — N186 End stage renal disease: Secondary | ICD-10-CM | POA: Diagnosis not present

## 2021-11-12 DIAGNOSIS — D509 Iron deficiency anemia, unspecified: Secondary | ICD-10-CM | POA: Diagnosis not present

## 2021-11-12 DIAGNOSIS — N2581 Secondary hyperparathyroidism of renal origin: Secondary | ICD-10-CM | POA: Diagnosis not present

## 2021-11-12 DIAGNOSIS — Z992 Dependence on renal dialysis: Secondary | ICD-10-CM | POA: Diagnosis not present

## 2021-11-14 ENCOUNTER — Other Ambulatory Visit: Payer: Self-pay | Admitting: Student

## 2021-11-14 ENCOUNTER — Other Ambulatory Visit (HOSPITAL_COMMUNITY): Payer: Self-pay

## 2021-11-15 ENCOUNTER — Other Ambulatory Visit (HOSPITAL_COMMUNITY): Payer: Self-pay

## 2021-11-16 DIAGNOSIS — Z992 Dependence on renal dialysis: Secondary | ICD-10-CM | POA: Diagnosis not present

## 2021-11-16 DIAGNOSIS — D509 Iron deficiency anemia, unspecified: Secondary | ICD-10-CM | POA: Diagnosis not present

## 2021-11-16 DIAGNOSIS — N2581 Secondary hyperparathyroidism of renal origin: Secondary | ICD-10-CM | POA: Diagnosis not present

## 2021-11-16 DIAGNOSIS — N186 End stage renal disease: Secondary | ICD-10-CM | POA: Diagnosis not present

## 2021-11-16 DIAGNOSIS — D689 Coagulation defect, unspecified: Secondary | ICD-10-CM | POA: Diagnosis not present

## 2021-11-19 ENCOUNTER — Other Ambulatory Visit: Payer: Self-pay | Admitting: Student

## 2021-11-19 ENCOUNTER — Encounter: Payer: Self-pay | Admitting: Student

## 2021-11-19 ENCOUNTER — Encounter (HOSPITAL_COMMUNITY): Payer: Self-pay | Admitting: *Deleted

## 2021-11-19 ENCOUNTER — Other Ambulatory Visit (HOSPITAL_COMMUNITY): Payer: Self-pay

## 2021-11-19 DIAGNOSIS — N186 End stage renal disease: Secondary | ICD-10-CM | POA: Diagnosis not present

## 2021-11-19 DIAGNOSIS — Z992 Dependence on renal dialysis: Secondary | ICD-10-CM | POA: Diagnosis not present

## 2021-11-19 DIAGNOSIS — E1121 Type 2 diabetes mellitus with diabetic nephropathy: Secondary | ICD-10-CM

## 2021-11-19 DIAGNOSIS — D689 Coagulation defect, unspecified: Secondary | ICD-10-CM | POA: Diagnosis not present

## 2021-11-19 DIAGNOSIS — N2581 Secondary hyperparathyroidism of renal origin: Secondary | ICD-10-CM | POA: Diagnosis not present

## 2021-11-19 MED ORDER — GLIPIZIDE 5 MG PO TABS
5.0000 mg | ORAL_TABLET | Freq: Every day | ORAL | 2 refills | Status: DC
  Filled 2021-11-19: qty 30, 30d supply, fill #0
  Filled 2021-12-20: qty 30, 30d supply, fill #1
  Filled 2022-01-22: qty 30, 30d supply, fill #2

## 2021-11-19 MED ORDER — GLIPIZIDE 5 MG PO TABS
5.0000 mg | ORAL_TABLET | Freq: Every day | ORAL | 0 refills | Status: DC
  Filled 2021-11-19: qty 90, 90d supply, fill #0

## 2021-11-19 MED ORDER — GLIPIZIDE 5 MG PO TABS
5.0000 mg | ORAL_TABLET | Freq: Every day | ORAL | 0 refills | Status: DC
Start: 2021-11-19 — End: 2021-11-19
  Filled 2021-11-19: qty 30, 30d supply, fill #0

## 2021-11-19 NOTE — Telephone Encounter (Signed)
Please resend prescription for Glipizide 5 mg . ?

## 2021-11-19 NOTE — Progress Notes (Signed)
I sent a refill of glipizide 5 mg to the pharmacy because Dr. Eulas Post is not approved under Medicaid. ?

## 2021-11-20 ENCOUNTER — Other Ambulatory Visit: Payer: Self-pay

## 2021-11-20 NOTE — Telephone Encounter (Signed)
Call to patient informed him that his Kidney doctor will need to handle his [place].  Patient has an appointment for Dialysis on tomorrow will discuss a refill with the doctor there tomorrow.  Patient also asked about scheduling an appointment. Transferred to the front office to reschedule his appointment. ?

## 2021-11-23 DIAGNOSIS — N186 End stage renal disease: Secondary | ICD-10-CM | POA: Diagnosis not present

## 2021-11-23 DIAGNOSIS — Z992 Dependence on renal dialysis: Secondary | ICD-10-CM | POA: Diagnosis not present

## 2021-11-23 DIAGNOSIS — N2581 Secondary hyperparathyroidism of renal origin: Secondary | ICD-10-CM | POA: Diagnosis not present

## 2021-11-23 DIAGNOSIS — D689 Coagulation defect, unspecified: Secondary | ICD-10-CM | POA: Diagnosis not present

## 2021-11-26 DIAGNOSIS — D509 Iron deficiency anemia, unspecified: Secondary | ICD-10-CM | POA: Diagnosis not present

## 2021-11-26 DIAGNOSIS — N186 End stage renal disease: Secondary | ICD-10-CM | POA: Diagnosis not present

## 2021-11-26 DIAGNOSIS — Z992 Dependence on renal dialysis: Secondary | ICD-10-CM | POA: Diagnosis not present

## 2021-11-26 DIAGNOSIS — N2581 Secondary hyperparathyroidism of renal origin: Secondary | ICD-10-CM | POA: Diagnosis not present

## 2021-11-26 DIAGNOSIS — D689 Coagulation defect, unspecified: Secondary | ICD-10-CM | POA: Diagnosis not present

## 2021-11-27 ENCOUNTER — Encounter: Payer: Medicaid Other | Admitting: Internal Medicine

## 2021-11-27 ENCOUNTER — Telehealth: Payer: Self-pay | Admitting: *Deleted

## 2021-11-27 ENCOUNTER — Other Ambulatory Visit: Payer: Self-pay

## 2021-11-27 NOTE — Telephone Encounter (Signed)
Pt dnka appt today with [facility] ?Call made to pt in an attempt to reschedule ?No answer, unable to leave message. ?Phone call complete at this time.[name]3/22/20232:14 PM ? ?

## 2021-11-27 NOTE — Progress Notes (Deleted)
? ?CC: routine clinic follow up visit  ? ?HPI: ? ?[name] is a 41 y.o. male with a PMHx stated below and presents today for stated above. Please see the Encounters tab for problem-based Assessment & Plan for additional details.  ? ?Past Medical History:  ?Diagnosis Date  ? Abscess of left groin   ? Acute blood loss anemia 11/11/2013  ? Anemia in chronic kidney disease (CKD)   ? Asthma   ? Boil of scrotum 11/21/2015  ? Chest pain   ? a. 01/2015 Lexiscan MV: EF 28%, inferior, inferolateral, apical ischemia;  b. 01/2015 Cath: nl cors, PCWP 18 mmHg, CO 9.38 L/min, CI 3.53 L/min/m^2.  ? CHF (congestive heart failure) ([place])   ? Depression   ? Situational  ? ESRD (end stage renal disease) ([place])   ? Essential hypertension   ? Family history of adverse reaction to anesthesia   ? sister- "it was too much for her heart"  ? GERD (gastroesophageal reflux disease)   ? Hyperlipidemia   ? Membranous glomerulonephritis   ? Morbid obesity ([place])   ? Nonischemic cardiomyopathy ([place])   ? a. 01/2015 Echo: EF 20-25%, diff HK, Gr 2 DD, Triv AI, mildly dil LA and Ao root.  ? PONV (postoperative nausea and vomiting)   ? Seizures ([place]) 02/29/2020  ? "electralytes were out of wack."  ? Type II diabetes mellitus ([place])   ? a. 01/2015 HbA1c = 8.9.  ? ? ?Current Outpatient Medications on File Prior to Visit  ?Medication Sig Dispense Refill  ? Accu-Chek FastClix Lancets MISC Check your blood sugars 4 times a day. 402 each 1  ? ACCU-CHEK GUIDE test strip USE TO CHECK BLOOD SUGAR 4 TIMES DAILY 375 strip 1  ? amLODipine (NORVASC) 10 MG tablet Take 1 tablet (10 mg total) by mouth daily. (Patient taking differently: Take 10 mg by mouth every evening.) 90 tablet 3  ? atorvastatin (LIPITOR) 80 MG tablet Take 1 tablet (80 mg total) by mouth daily. 90 tablet 3  ? AURYXIA 1 GM 210 MG(Fe) tablet Take 420 mg by mouth 3 (three) times daily.    ? blood glucose meter kit and supplies KIT Dispense based on patient and insurance preference. Use up to four  times daily as directed. (FOR ICD-9 250.00, 250.01). 1 each 0  ? calcitRIOL (ROCALTROL) 0.25 MCG capsule Take 1 capsule (0.25 mcg total) by mouth daily. (Patient not taking: Reported on 10/07/2021) 30 capsule 6  ? carvedilol (COREG) 25 MG tablet Take 1.5 tablets (37.5 mg total) by mouth 2 (two) times daily with a meal. 270 tablet 3  ? cephALEXin (KEFLEX) 500 MG capsule Take 1 capsule (500 mg total) by mouth 2 (two) times daily. (Patient not taking: Reported on 08/06/2021) 14 capsule 0  ? cloNIDine (CATAPRES) 0.3 MG tablet Take 1 tablet (0.3 mg total) by mouth 2 (two) times daily. 60 tablet 6  ? cloNIDine (CATAPRES) 0.3 MG tablet Take 1 tablet (0.3 mg total) by mouth 2 (two) times daily as directed (Patient not taking: Reported on 10/08/2021) 180 tablet 4  ? glipiZIDE (GLUCOTROL) 5 MG tablet Take 1 tablet (5 mg total) by mouth daily before breakfast. 30 tablet 2  ? hydrALAZINE (APRESOLINE) 100 MG tablet Take 1 tablet (100 mg total) by mouth 3 (three) times daily. 90 tablet 2  ? insulin aspart (NOVOLOG) 100 UNIT/ML FlexPen Inject 15 Units into the skin 3 (three) times daily with meals. (Patient taking differently: Inject 17 Units into the skin 3 (three)  times daily with meals.) 39 mL 0  ? insulin glargine (LANTUS) 100 unit/mL SOPN Inject 37 Units into the skin at bedtime. 33.3 mL 0  ? Insulin Pen Needle (PEN NEEDLES) 31G X 5 MM MISC use four times daily with insulin 300 each 2  ? Insulin Syringe-Needle U-100 (ULTICARE INSULIN SYRINGE) 30G X 1/2" 0.5 ML MISC USE 4 TIMES DAILY WITH INSULIN INJECTIONS. 100 each 3  ? isosorbide mononitrate (IMDUR) 30 MG 24 hr tablet TAKE 1 TABLET BY MOUTH DAILY 30 tablet 6  ? losartan (COZAAR) 50 MG tablet Take 1 tablet (50 mg total) by mouth every evening. (Patient not taking: Reported on 10/08/2021) 90 tablet 3  ? multivitamin (RENA-VIT) TABS tablet Take 1 tablet by mouth daily.    ? nitroGLYCERIN (NITROSTAT) 0.4 MG SL tablet PLACE 1 TABLET UNDER THE TONGUE EVERY 5 MINUTES AS NEEDED FOR  CHEST PAIN. 25 tablet 3  ? ondansetron (ZOFRAN ODT) 4 MG disintegrating tablet Take 1 tablet (4 mg total) by mouth every 8 (eight) hours as needed for nausea or vomiting. 20 tablet 0  ? oxyCODONE-acetaminophen (PERCOCET/ROXICET) 5-325 MG tablet Take 1 tablet by mouth every 6 (six) hours as needed for severe pain. (Patient not taking: Reported on 08/06/2021) 10 tablet 0  ? RENVELA 800 MG tablet Take 1,600 mg by mouth 3 (three) times daily with meals.    ? sodium bicarbonate 650 MG tablet Take 2 tablets (1,300 mg total) by mouth 3 (three) times daily. (Patient not taking: Reported on 08/06/2021) 90 tablet 5  ? ?No current facility-administered medications on file prior to visit.  ? ? ?Family History  ?Problem Relation Age of Onset  ? Diabetes Mellitus II Mother   ?     died @ 44.  ? Gastric cancer Mother   ? CAD Father   ?     died @ 84.  ? Heart attack Father   ? Congestive Heart Failure Father   ? Diabetes Mellitus II Sister   ? CAD Sister   ?     s/p PCI - age 25.  ? ? ?Social History  ? ?Socioeconomic History  ? Marital status: Single  ?  Spouse name: Not on file  ? Number of children: Not on file  ? Years of education: Not on file  ? Highest education level: Some college, no degree  ?Occupational History  ? Not on file  ?Tobacco Use  ? Smoking status: Never  ? Smokeless tobacco: Never  ?Vaping Use  ? Vaping Use: Never used  ?Substance and Sexual Activity  ? Alcohol use: No  ?  Alcohol/week: 0.0 standard drinks  ? Drug use: Yes  ?  Types: Marijuana  ?  Comment: last used 09/15/2020  ? Sexual activity: Yes  ?  Partners: Female  ?Other Topics Concern  ? Not on file  ?Social History Narrative  ? Lives in [place] by himself.  Currently in [facility].  ? Caffeine- none  ?    ? ?Social Determinants of Health  ? ?Financial Resource Strain: Not on file  ?Food Insecurity: Food Insecurity Present  ? Worried About Charity fundraiser in the Last Year: Sometimes true  ? Ran Out of Food in the Last Year: Sometimes true   ?Transportation Needs: No Transportation Needs  ? Lack of Transportation (Medical): No  ? Lack of Transportation (Non-Medical): No  ?Physical Activity: Not on file  ?Stress: Not on file  ?Social Connections: Not on file  ?Intimate Partner Violence: Not  on file  ? ? ?Review of Systems: ?ROS negative except for what is noted on the assessment and plan. ? ?There were no vitals filed for this visit. ? ? ?Physical Exam: ?Constitutional: alert, well-appearing, in NAD ?HENT: normocephalic, atraumatic, mucous membranes moist ?Eyes: conjunctiva non-erythematous, EOMI ?Neck: supple *** ?Cardiovascular: RRR, no m/r/g, non-edematous bilateral LE ?Pulmonary/Chest: normal work of breathing on RA, LCTAB ?Abdominal: soft, non-tender to palpation, non-distended ?MSK: normal bulk and tone  ?Neurological: A&O x 3, 5/5 strength in bilateral upper and lower extremities *** ?Skin: warm and dry *** ?Psych: normal behavior, normal affect *** ? ? ?Assessment & Plan:  ? ?See Encounters Tab for problem based charting. ? ?Patient discussed with Dr. {[id number]::"MMCRFVOH","KGOVPCH","EKBTCY","ELYHTMBP","JPETKKO","ECXFQHKU","VJD","YNXGZF"} ? ?Lajean Manes, MD  ?Internal Medicine Resident, PGY-1 ?[facility]  ? ?

## 2021-11-27 NOTE — Patient Outreach (Signed)
?Medicaid Managed Care   ?Nurse Care Manager Note ? ?11/27/2021 ?Name:  Jeremy [name] MRN:  [id number] DOB:  03-28-1981 ? ?Darrall Strey is an 41 y.o. year old male who is a primary patient of Rick Duff, MD.  The [facility] Managed Care Coordination team was consulted for assistance with:    ?ESRD ?DMII ? ?Mr. Mcduffee was given information about Medicaid Managed Care Coordination team services today. [name] agreed to services and verbal consent obtained. ? ?Engaged with patient by telephone for follow up visit in response to provider referral for case management and/or care coordination services.  ? ?Assessments/Interventions:  Review of past medical history, allergies, medications, health status, including review of consultants reports, laboratory and other test data, was performed as part of comprehensive evaluation and provision of chronic care management services. ? ?SDOH (Social Determinants of Health) assessments and interventions performed: ?SDOH Interventions   ? ?Flowsheet Row Most Recent Value  ?SDOH Interventions   ?Physical Activity Interventions Intervention Not Indicated  ?Stress Interventions Other (Comment)  [Offered LCSW-patient declined]  ? ?  ? ? ?Care Plan ? ?Allergies  ?Allergen Reactions  ? Acyclovir And Related Hives  ? ? ?Medications Reviewed Today   ? ? Reviewed by Melissa Montane, RN (Registered Nurse) on 11/27/21 at 1549  Med List Status: <None>  ? ?Medication Order Taking? Sig Documenting Provider Last Dose Status Informant  ?Accu-Chek FastClix Lancets MISC [id number] Yes Check your blood sugars 4 times a day. Katherine Roan, MD Taking Active Self  ?ACCU-CHEK GUIDE test strip [id number] Yes USE TO CHECK BLOOD SUGAR 4 TIMES DAILY Winfrey, Jenne Pane, MD Taking Active Self  ?amLODipine (NORVASC) 10 MG tablet [id number] Yes Take 1 tablet (10 mg total) by mouth daily.  ?Patient taking differently: Take 10 mg by mouth every evening.  ? Cato Mulligan, MD Taking Active  Self  ?atorvastatin (LIPITOR) 80 MG tablet [id number] Yes Take 1 tablet (80 mg total) by mouth daily. Bensimhon, Shaune Pascal, MD Taking Active Self  ?AURYXIA 1 GM 210 MG(Fe) tablet [id number] Yes Take 420 mg by mouth 3 (three) times daily. [provider] Taking Active Self  ?blood glucose meter kit and supplies KIT [id number] Yes Dispense based on patient and insurance preference. Use up to four times daily as directed. (FOR ICD-9 250.00, 250.01). Katherine Roan, MD Taking Active Self  ?calcitRIOL (ROCALTROL) 0.25 MCG capsule [id number] No Take 1 capsule (0.25 mcg total) by mouth daily.  ?Patient not taking: Reported on 10/07/2021  ?  Not Taking Active Self  ?         ?Med Note [facility]   Tue Oct 08, 2021  4:37 PM)    ?carvedilol (COREG) 25 MG tablet [id number] Yes Take 1.5 tablets (37.5 mg total) by mouth 2 (two) times daily with a meal. [name], MD Taking Active Self  ?cephALEXin (KEFLEX) 500 MG capsule [id number] No Take 1 capsule (500 mg total) by mouth 2 (two) times daily.  ?Patient not taking: Reported on 08/06/2021  ?  Not Taking Active Self  ?cloNIDine (CATAPRES) 0.3 MG tablet [id number] Yes Take 1 tablet (0.3 mg total) by mouth 2 (two) times daily. [name], MD Taking Active Self  ?cloNIDine (CATAPRES) 0.3 MG tablet [id number] No Take 1 tablet (0.3 mg total) by mouth 2 (two) times daily as directed  ?Patient not taking: Reported on 10/08/2021  ?  Not Taking Active Self  ?glipiZIDE (GLUCOTROL) 5 MG tablet [id number] Yes Take 1 tablet (5 mg  total) by mouth daily before breakfast. Gaylan Gerold, DO Taking Active   ?hydrALAZINE (APRESOLINE) 100 MG tablet [id number] Yes Take 1 tablet (100 mg total) by mouth 3 (three) times daily. Bensimhon, Shaune Pascal, MD Taking Active Self  ?insulin aspart (NOVOLOG) 100 UNIT/ML FlexPen [id number] Yes Inject 15 Units into the skin 3 (three) times daily with meals.  ?Patient taking differently: Inject 17 Units into the skin 3 (three) times daily with meals.   ? Gaylan Gerold, DO Taking Active Self  ?         ?Med Note [facility]   Tue Oct 08, 2021  4:37 PM)    ?insulin glargine (LANTUS) 100 unit/mL SOPN [id number] Yes Inject 37 Units into the skin at bedtime. Gaylan Gerold, DO Taking Active Self  ?Insulin Pen Needle (PEN NEEDLES) 31G X 5 MM MISC [id number] Yes use four times daily with insulin Gaylan Gerold, DO Taking Active Self  ?Insulin Syringe-Needle U-100 (ULTICARE INSULIN SYRINGE) 30G X 1/2" 0.5 ML MISC [id number] Yes USE 4 TIMES DAILY WITH INSULIN INJECTIONS. Iona Beard, MD Taking Active Self  ?isosorbide mononitrate (IMDUR) 30 MG 24 hr tablet [id number]  TAKE 1 TABLET BY MOUTH DAILY Loren Racer, PA-C  Expired 11/15/21 2359 Self  ?losartan (COZAAR) 50 MG tablet [id number] No Take 1 tablet (50 mg total) by mouth every evening.  ?Patient not taking: Reported on 10/08/2021  ?  Not Taking Active Self  ?multivitamin (RENA-VIT) TABS tablet [id number] Yes Take 1 tablet by mouth daily. [provider] Taking Active Self  ?nitroGLYCERIN (NITROSTAT) 0.4 MG SL tablet [id number]  PLACE 1 TABLET UNDER THE TONGUE EVERY 5 MINUTES AS NEEDED FOR CHEST PAIN. [name], MD  Active Self  ?         ?Med Note ([place] Jul 02, 2021  8:15 PM)    ?ondansetron (ZOFRAN ODT) 4 MG disintegrating tablet [id number] Yes Take 1 tablet (4 mg total) by mouth every 8 (eight) hours as needed for nausea or vomiting. Smoot, Leary Roca, PA-C Taking Active Self  ?oxyCODONE-acetaminophen (PERCOCET/ROXICET) 5-325 MG tablet [id number] No Take 1 tablet by mouth every 6 (six) hours as needed for severe pain.  ?Patient not taking: Reported on 08/06/2021  ? Smoot, Leary Roca, PA-C Not Taking Active Self  ?RENVELA 800 MG tablet [id number] Yes Take 1,600 mg by mouth 3 (three) times daily with meals. [provider] Taking Active Self  ?sodium bicarbonate 650 MG tablet [id number] No Take 2 tablets (1,300 mg total) by mouth 3 (three) times daily.  ?Patient not taking: Reported  on 08/06/2021  ?  Not Taking Active Self  ? ?  ?  ? ?  ? ? ?Patient Active Problem List  ? Diagnosis Date Noted  ? Unspecified protein-calorie malnutrition ([place]) 10/27/2019  ? Anemia, unspecified 10/26/2019  ? Acute kidney failure, unspecified ([place]) 10/21/2019  ? Coagulation defect, unspecified ([place]) 10/21/2019  ? Personal history of COVID-19 10/21/2019  ? Secondary hyperparathyroidism of renal origin ([place]) 10/21/2019  ? COVID-19 virus infection   ? Mass of soft tissue of face 09/23/2018  ? Onychomycosis due to dermatophyte 03/29/2018  ? Anemia, iron deficiency 07/18/2016  ? Anemia in chronic kidney disease 07/18/2016  ? 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 Suspected sleep apnea 05/24/2015  ? Chronic combined systolic and diastolic CHF (congestive heart failure) ([place]) 03/01/2015  ? Nonischemic cardiomyopathy ([place])   ? Chest pain 01/09/2015  ? Asthma   ?  Hyperlipidemia 03/31/2012  ? Obesity, Class I, BMI 30-34.9 03/21/2011  ? Essential hypertension 03/21/2011  ? Gastroesophageal reflux disease 03/21/2011  ? Type 2 diabetes mellitus with diabetic nephropathy ([place]) 02/20/2011  ? ? ?Conditions to be addressed/monitored per PCP order:  DMII and ESRD ? ?Care Plan : RN Care Manager Plan of Care  ?Updates made by Melissa Montane, RN since 11/27/2021 12:00 AM  ?  ? ?Problem: Health Management Needs related to ESRD, DMII and CHTN   ?  ? ?Long-Range Goal: Development of Plan of Care to Address Health Management Needs related to ESRD, DMII and CHTN   ?Start Date: 10/07/2021  ?Expected End Date: 01/05/2022  ?Priority: High  ?Note:   ?Current Barriers:  ?Chronic Disease Management support and education needs related to DMII and ESRD ?Film/video editor.  ?Mr. Geralds needs to rescheduled missed PCP appointment. He has the office number. He is checking his BP daily, reading today 106/95. He checks his BS before meals and if he is feeling bad. ? ?RNCM Clinical Goal(s):   ?Patient will verbalize understanding of plan for management of DMII and ESRD as evidenced by patient verbalization of self monitoring activities ?take all medications exactly as prescribed and wil

## 2021-11-27 NOTE — Patient Instructions (Signed)
Visit Information ? ?Jeremy [name] was given information about Medicaid Managed Care team care coordination services as a part of their [employer] Medicaid benefit. Jeremy [name] verbally consented to engagement with the [facility] Managed Care team.  ? ?If you are experiencing a medical emergency, please call 911 or report to your local emergency department or urgent care.  ? ?If you have a non-emergency medical problem during routine business hours, please contact your provider's office and ask to speak with a nurse.  ? ?For questions related to your Healthy [facility] health plan, please call: [phone] or visit the homepage here: [email or web address] ? ?If you would like to schedule transportation through your Healthy [facility] plan, please call the following number at least 2 days in advance of your appointment: [phone] ? For information about your ride after you set it up, call Ride Assist at [phone]. Use this number to activate a Will Call pickup, or if your transportation is late for a scheduled pickup. Use this number, too, if you need to make a change or cancel a previously scheduled reservation. ? If you need transportation services right away, call [phone]. The after-hours call center is staffed 24 hours to handle ride assistance and urgent reservation requests (including discharges) 365 days a year. Urgent trips include sick visits, hospital discharge requests and life-sustaining treatment. ? ?Call the [place] at [phone], at any time, 24 hours a day, 7 days a week. If you are in danger or need immediate medical attention call 911. ? ?If you would like help to quit smoking, call 1-800-QUIT-NOW 919-499-3257) OR Espa?ol: 1-855-D?jelo-Ya (276) 692-7546) o para m?s informaci?n haga clic aqu? or Text READY to 200-400 to register via text ? ?Jeremy [name], ? ? ?Please see education materials related to diabetes provided by  MyChart link. ? ?Patient verbalizes understanding of instructions and care plan provided today and agrees to view in [place]. Active MyChart status confirmed with patient.   ? ?Telephone follow up appointment with Managed Medicaid care management team member scheduled for:12/27/21 @ 3:30pm ? ?Lurena Joiner RN, BSN ?[place] ?RN Care Coordinator ? ? ?Following is a copy of your plan of care:  ?Care Plan : [place] of Care  ?Updates made by Melissa Montane, RN since 11/27/2021 12:00 AM  ?  ? ?Problem: Health Management Needs related to ESRD, DMII and CHTN   ?  ? ?Long-Range Goal: Development of Plan of Care to Address Health Management Needs related to ESRD, DMII and CHTN   ?Start Date: 10/07/2021  ?Expected End Date: 01/05/2022  ?Priority: High  ?Note:   ?Current Barriers:  ?Chronic Disease Management support and education needs related to DMII and ESRD ?Film/video editor.  ?Jeremy [name] needs to rescheduled missed PCP appointment. He has the office number. He is checking his BP daily, reading today 106/95. He checks his BS before meals and if he is feeling bad. ? ?RNCM Clinical Goal(s):  ?Patient will verbalize understanding of plan for management of DMII and ESRD as evidenced by patient verbalization of self monitoring activities ?take all medications exactly as prescribed and will call provider for medication related questions as evidenced by documentation in EMR    ?attend all scheduled medical appointments: Dialysis on T/TH/S and calling to schedule visit with PCP as evidenced by provider documentation in EMR        ?work with Education officer, museum to address Financial constraints related to lack of income, Limited access to food, and  Housing barriers related to the management of DMII and ESRD as evidenced by review of EMR and patient or social worker report     through collaboration with Consulting civil engineer, provider, and care team.  ? ?Interventions: ?Inter-disciplinary care team  collaboration (see longitudinal plan of care) ?Evaluation of current treatment plan related to  self management and patient's adherence to plan as established by provider ?10/11/21: BSW contacted patient regarding applying for disability and foodstamps. Patient states he is currently on dialysis and unable to work. Patient informed patient he would have to got to [facility].gov to apply for disability or go into the office, patient stated he already has an appointment with disability. BSW informed patient in order to apply for foodstamps he can apply online or go down to social services on [address] to apply. No other resources needed at this time.  ?11/08/21: BSW completed follow up with patient. He states he has not had his appointment with disability yet and he has not applied for foodstamps yet. No other resources needed at this time. ? ? ?Chronic Kidney Disease (Status: Goal on Track (progressing): YES.)  Long Term Goal  ?Last practice recorded BP readings:  ?BP Readings from Last 3 Encounters:  ?08/12/21 (!) 185/106  ?07/02/21 (!) 158/86  ?04/22/21 (!) 167/99  ?Most recent eGFR/CrCl: No results found for: EGFR  No components found for: CRCL ? ?Reviewed medications with patient and discussed importance of compliance    ?Counseled on the importance of exercise goals with target of 150 minutes per week     ?Advised patient, providing education and rationale, to monitor blood pressure daily and record, calling PCP for findings outside established parameters    ?Reviewed scheduled/upcoming provider appointments including    ?Discussed plans with patient for ongoing care management follow up and provided patient with direct contact information for care management team    ?Assessed social determinant of health barriers    ?Offered to patient referral to LCSW for assistance with managing stress-patient will think about it and let RNCM know if he needs this assistance ? ? ?Diabetes:  (Status: Goal on Track (progressing): YES.)  Long Term Goal  ? ?Lab Results  ?Component Value Date  ? HGBA1C 7.2 (A) 10/15/2020  ?  ?Assessed patient's understanding of A1c goal: <7% ?Reviewed medications with patient and discussed importance of medication adherence;        ?Discussed plans with patient for ongoing care management follow up and provided patient with direct contact information for care management team;      ?Provided patient with written educational materials related to hypo and hyperglycemia and importance of correct treatment;       ?Reviewed scheduled/upcoming provider appointments including: Dialysis on T/TH/S and rescheduling missed PCP visit, [facility] [phone];         ?Review of patient status, including review of consultants reports, relevant laboratory and other test results, and medications completed;       ?Assessed social determinant of health barriers;        ?Discussed the importance of regular visits with PCP ?Advised patient to take BP log, BS log, glucometer and all medications to PCP appointment ?Advised patient to discuss referral for Eye Exam with PCP ? ?Patient Goals/Self-Care Activities: ?Take medications as prescribed   ?Attend all scheduled provider appointments ?Call pharmacy for medication refills 3-7 days in advance of running out of medications ?Perform all self care activities independently  ?Perform IADL's (shopping, preparing meals, housekeeping, managing finances) independently ?  Call provider office for new concerns or questions  ?Work with the Education officer, museum to address care coordination needs and will continue to work with the clinical team to address health care and disease management related needs ?check blood sugar at prescribed times: three times daily ?take the blood sugar meter to all doctor visits ?fill half of plate with vegetables ?manage portion size ? ? ?  ? ?  ?

## 2021-11-28 ENCOUNTER — Encounter: Payer: Self-pay | Admitting: Student

## 2021-11-30 DIAGNOSIS — D689 Coagulation defect, unspecified: Secondary | ICD-10-CM | POA: Diagnosis not present

## 2021-11-30 DIAGNOSIS — Z992 Dependence on renal dialysis: Secondary | ICD-10-CM | POA: Diagnosis not present

## 2021-11-30 DIAGNOSIS — N2581 Secondary hyperparathyroidism of renal origin: Secondary | ICD-10-CM | POA: Diagnosis not present

## 2021-11-30 DIAGNOSIS — N186 End stage renal disease: Secondary | ICD-10-CM | POA: Diagnosis not present

## 2021-11-30 DIAGNOSIS — D509 Iron deficiency anemia, unspecified: Secondary | ICD-10-CM | POA: Diagnosis not present

## 2021-12-03 DIAGNOSIS — N186 End stage renal disease: Secondary | ICD-10-CM | POA: Diagnosis not present

## 2021-12-03 DIAGNOSIS — Z992 Dependence on renal dialysis: Secondary | ICD-10-CM | POA: Diagnosis not present

## 2021-12-03 DIAGNOSIS — D689 Coagulation defect, unspecified: Secondary | ICD-10-CM | POA: Diagnosis not present

## 2021-12-03 DIAGNOSIS — N2581 Secondary hyperparathyroidism of renal origin: Secondary | ICD-10-CM | POA: Diagnosis not present

## 2021-12-03 DIAGNOSIS — D509 Iron deficiency anemia, unspecified: Secondary | ICD-10-CM | POA: Diagnosis not present

## 2021-12-05 DIAGNOSIS — D689 Coagulation defect, unspecified: Secondary | ICD-10-CM | POA: Diagnosis not present

## 2021-12-05 DIAGNOSIS — Z992 Dependence on renal dialysis: Secondary | ICD-10-CM | POA: Diagnosis not present

## 2021-12-05 DIAGNOSIS — N186 End stage renal disease: Secondary | ICD-10-CM | POA: Diagnosis not present

## 2021-12-05 DIAGNOSIS — D509 Iron deficiency anemia, unspecified: Secondary | ICD-10-CM | POA: Diagnosis not present

## 2021-12-05 DIAGNOSIS — N2581 Secondary hyperparathyroidism of renal origin: Secondary | ICD-10-CM | POA: Diagnosis not present

## 2021-12-06 DIAGNOSIS — N186 End stage renal disease: Secondary | ICD-10-CM | POA: Diagnosis not present

## 2021-12-06 DIAGNOSIS — E1129 Type 2 diabetes mellitus with other diabetic kidney complication: Secondary | ICD-10-CM | POA: Diagnosis not present

## 2021-12-06 DIAGNOSIS — Z992 Dependence on renal dialysis: Secondary | ICD-10-CM | POA: Diagnosis not present

## 2021-12-07 DIAGNOSIS — N186 End stage renal disease: Secondary | ICD-10-CM | POA: Diagnosis not present

## 2021-12-07 DIAGNOSIS — Z992 Dependence on renal dialysis: Secondary | ICD-10-CM | POA: Diagnosis not present

## 2021-12-07 DIAGNOSIS — N2581 Secondary hyperparathyroidism of renal origin: Secondary | ICD-10-CM | POA: Diagnosis not present

## 2021-12-07 DIAGNOSIS — D689 Coagulation defect, unspecified: Secondary | ICD-10-CM | POA: Diagnosis not present

## 2021-12-12 DIAGNOSIS — D689 Coagulation defect, unspecified: Secondary | ICD-10-CM | POA: Diagnosis not present

## 2021-12-12 DIAGNOSIS — Z992 Dependence on renal dialysis: Secondary | ICD-10-CM | POA: Diagnosis not present

## 2021-12-12 DIAGNOSIS — N2581 Secondary hyperparathyroidism of renal origin: Secondary | ICD-10-CM | POA: Diagnosis not present

## 2021-12-12 DIAGNOSIS — N186 End stage renal disease: Secondary | ICD-10-CM | POA: Diagnosis not present

## 2021-12-14 DIAGNOSIS — N2581 Secondary hyperparathyroidism of renal origin: Secondary | ICD-10-CM | POA: Diagnosis not present

## 2021-12-14 DIAGNOSIS — D689 Coagulation defect, unspecified: Secondary | ICD-10-CM | POA: Diagnosis not present

## 2021-12-14 DIAGNOSIS — Z992 Dependence on renal dialysis: Secondary | ICD-10-CM | POA: Diagnosis not present

## 2021-12-14 DIAGNOSIS — N186 End stage renal disease: Secondary | ICD-10-CM | POA: Diagnosis not present

## 2021-12-17 DIAGNOSIS — N2581 Secondary hyperparathyroidism of renal origin: Secondary | ICD-10-CM | POA: Diagnosis not present

## 2021-12-17 DIAGNOSIS — Z992 Dependence on renal dialysis: Secondary | ICD-10-CM | POA: Diagnosis not present

## 2021-12-17 DIAGNOSIS — N186 End stage renal disease: Secondary | ICD-10-CM | POA: Diagnosis not present

## 2021-12-17 DIAGNOSIS — D689 Coagulation defect, unspecified: Secondary | ICD-10-CM | POA: Diagnosis not present

## 2021-12-20 ENCOUNTER — Other Ambulatory Visit (HOSPITAL_COMMUNITY): Payer: Self-pay

## 2021-12-21 DIAGNOSIS — Z992 Dependence on renal dialysis: Secondary | ICD-10-CM | POA: Diagnosis not present

## 2021-12-21 DIAGNOSIS — D689 Coagulation defect, unspecified: Secondary | ICD-10-CM | POA: Diagnosis not present

## 2021-12-21 DIAGNOSIS — N2581 Secondary hyperparathyroidism of renal origin: Secondary | ICD-10-CM | POA: Diagnosis not present

## 2021-12-21 DIAGNOSIS — N186 End stage renal disease: Secondary | ICD-10-CM | POA: Diagnosis not present

## 2021-12-24 DIAGNOSIS — Z992 Dependence on renal dialysis: Secondary | ICD-10-CM | POA: Diagnosis not present

## 2021-12-24 DIAGNOSIS — D509 Iron deficiency anemia, unspecified: Secondary | ICD-10-CM | POA: Diagnosis not present

## 2021-12-24 DIAGNOSIS — N186 End stage renal disease: Secondary | ICD-10-CM | POA: Diagnosis not present

## 2021-12-24 DIAGNOSIS — D689 Coagulation defect, unspecified: Secondary | ICD-10-CM | POA: Diagnosis not present

## 2021-12-24 DIAGNOSIS — N2581 Secondary hyperparathyroidism of renal origin: Secondary | ICD-10-CM | POA: Diagnosis not present

## 2021-12-27 ENCOUNTER — Other Ambulatory Visit: Payer: Self-pay | Admitting: *Deleted

## 2021-12-27 NOTE — Patient Outreach (Signed)
Care Coordination ? ?12/27/2021 ? ?[name] ?1981-04-28 ?[id number] ? ? ?Medicaid Managed Care  ? ?Unsuccessful Outreach Note ? ?12/27/2021 ?Name: Jeremy [name] MRN: [id number] DOB: 04/06/1981 ? ?Referred by: Rick Duff, MD ?Reason for referral : High Risk Managed Medicaid (Unsuccessful RNCM telephone outreach) ? ? ?An unsuccessful telephone outreach was attempted today. The patient was referred to the case management team for assistance with care management and care coordination.  ? ?Follow Up Plan: The care management team will reach out to the patient again over the next 14 days.  ? ?Lurena Joiner RN, BSN ?[place] ?RN Care Coordinator ? ? ?

## 2021-12-27 NOTE — Patient Instructions (Signed)
Visit Information ? ?[name]  - as a part of your Medicaid benefit, you are eligible for care management and care coordination services at no cost or copay. I was unable to reach you by phone today but would be happy to help you with your health related needs. Please feel free to call me @ [phone].  ? ?A member of the Managed Medicaid care management team will reach out to you again over the next 14 days.  ? ?Lurena Joiner RN, BSN ?[place] ?RN Care Coordinator ?  ?

## 2021-12-28 DIAGNOSIS — N2581 Secondary hyperparathyroidism of renal origin: Secondary | ICD-10-CM | POA: Diagnosis not present

## 2021-12-28 DIAGNOSIS — N186 End stage renal disease: Secondary | ICD-10-CM | POA: Diagnosis not present

## 2021-12-28 DIAGNOSIS — D689 Coagulation defect, unspecified: Secondary | ICD-10-CM | POA: Diagnosis not present

## 2021-12-28 DIAGNOSIS — Z992 Dependence on renal dialysis: Secondary | ICD-10-CM | POA: Diagnosis not present

## 2021-12-28 DIAGNOSIS — D509 Iron deficiency anemia, unspecified: Secondary | ICD-10-CM | POA: Diagnosis not present

## 2022-01-03 ENCOUNTER — Other Ambulatory Visit: Payer: Self-pay | Admitting: *Deleted

## 2022-01-03 NOTE — Patient Instructions (Signed)
Visit Information ? ?[name]  - as a part of your Medicaid benefit, you are eligible for care management and care coordination services at no cost or copay. I was unable to reach you by phone today but would be happy to help you with your health related needs. Please feel free to call me @ [phone].  ? ?A member of the Managed Medicaid care management team will reach out to you again over the next 14 days.  ? ?Lurena Joiner RN, BSN ?[place] ?RN Care Coordinator ?  ?

## 2022-01-03 NOTE — Patient Outreach (Signed)
Care Coordination ? ?01/03/2022 ? ?[name] ?1980/10/10 ?[id number] ? ? ?Medicaid Managed Care  ? ?Unsuccessful Outreach Note ? ?01/03/2022 ?Name: Jeremy [name] MRN: [id number] DOB: [birth year] ? ?Referred by: Rick Duff, MD ?Reason for referral : High Risk Managed Medicaid (Unsuccessful RNCM follow up telephone outreach) ? ? ?A second unsuccessful telephone outreach was attempted today. The patient was referred to the case management team for assistance with care management and care coordination.  ? ?Follow Up Plan: The care management team will reach out to the patient again over the next 14 days.  ? ?Lurena Joiner RN, BSN ?[place] ?RN Care Coordinator ? ? ?

## 2022-01-05 DIAGNOSIS — Z992 Dependence on renal dialysis: Secondary | ICD-10-CM | POA: Diagnosis not present

## 2022-01-05 DIAGNOSIS — E1129 Type 2 diabetes mellitus with other diabetic kidney complication: Secondary | ICD-10-CM | POA: Diagnosis not present

## 2022-01-05 DIAGNOSIS — N186 End stage renal disease: Secondary | ICD-10-CM | POA: Diagnosis not present

## 2022-01-09 DIAGNOSIS — N186 End stage renal disease: Secondary | ICD-10-CM | POA: Diagnosis not present

## 2022-01-09 DIAGNOSIS — Z992 Dependence on renal dialysis: Secondary | ICD-10-CM | POA: Diagnosis not present

## 2022-01-09 DIAGNOSIS — D689 Coagulation defect, unspecified: Secondary | ICD-10-CM | POA: Diagnosis not present

## 2022-01-09 DIAGNOSIS — D509 Iron deficiency anemia, unspecified: Secondary | ICD-10-CM | POA: Diagnosis not present

## 2022-01-09 DIAGNOSIS — N2581 Secondary hyperparathyroidism of renal origin: Secondary | ICD-10-CM | POA: Diagnosis not present

## 2022-01-10 ENCOUNTER — Other Ambulatory Visit: Payer: Self-pay | Admitting: Student

## 2022-01-10 ENCOUNTER — Other Ambulatory Visit: Payer: Self-pay | Admitting: Nephrology

## 2022-01-10 ENCOUNTER — Other Ambulatory Visit: Payer: Self-pay | Admitting: *Deleted

## 2022-01-10 ENCOUNTER — Other Ambulatory Visit (HOSPITAL_COMMUNITY): Payer: Self-pay | Admitting: *Deleted

## 2022-01-10 ENCOUNTER — Telehealth: Payer: Self-pay | Admitting: *Deleted

## 2022-01-10 ENCOUNTER — Other Ambulatory Visit (HOSPITAL_COMMUNITY): Payer: Self-pay

## 2022-01-10 MED ORDER — AMLODIPINE BESYLATE 10 MG PO TABS
10.0000 mg | ORAL_TABLET | Freq: Every day | ORAL | 3 refills | Status: DC
  Filled 2022-01-10: qty 90, 90d supply, fill #0

## 2022-01-10 MED ORDER — ISOSORBIDE MONONITRATE ER 30 MG PO TB24
30.0000 mg | ORAL_TABLET | Freq: Every day | ORAL | 0 refills | Status: DC
  Filled 2022-01-10: qty 90, 90d supply, fill #0

## 2022-01-10 MED ORDER — AMLODIPINE BESYLATE 10 MG PO TABS
10.0000 mg | ORAL_TABLET | Freq: Every day | ORAL | 3 refills | Status: DC
  Filled 2022-01-10: qty 90, 90d supply, fill #0
  Filled 2022-09-04: qty 90, 90d supply, fill #1

## 2022-01-10 NOTE — Telephone Encounter (Signed)
Ok. I have reordered and sent to [facility]. ?

## 2022-01-10 NOTE — Patient Instructions (Signed)
Visit Information ? ?Mr. Stirewalt was given information about Medicaid Managed Care team care coordination services as a part of their Healthy [employer] Medicaid benefit. Bennet Kujawa verbally consented to engagement with the [facility] Managed Care team.  ? ?If you are experiencing a medical emergency, please call 911 or report to your local emergency department or urgent care.  ? ?If you have a non-emergency medical problem during routine business hours, please contact your provider's office and ask to speak with a nurse.  ? ?For questions related to your [facility] health plan, please call: [phone] or visit the homepage here: [email or web address] ? ?If you would like to schedule transportation through your Healthy [facility] plan, please call the following number at least 2 days in advance of your appointment: [phone] ? For information about your ride after you set it up, call Ride Assist at [phone]. Use this number to activate a Will Call pickup, or if your transportation is late for a scheduled pickup. Use this number, too, if you need to make a change or cancel a previously scheduled reservation. ? If you need transportation services right away, call [phone]. The after-hours call center is staffed 24 hours to handle ride assistance and urgent reservation requests (including discharges) 365 days a year. Urgent trips include sick visits, hospital discharge requests and life-sustaining treatment. ? ?Call the [place] at [phone], at any time, 24 hours a day, 7 days a week. If you are in danger or need immediate medical attention call 911. ? ?If you would like help to quit smoking, call 1-800-QUIT-NOW (352) 702-4872) OR Espa?ol: 1-855-D?jelo-Ya 714-671-9398) o para m?s informaci?n haga clic aqu? or Text READY to 200-400 to register via text ? ?Mr. Levada Dy, ? ? ?Please see education materials related to HTN and living with  Chronic Disease provided by MyChart link. ? ?Patient verbalizes understanding of instructions and care plan provided today and agrees to view in [place]. Active MyChart status confirmed with patient.   ? ?Telephone follow up appointment with Managed Medicaid care management team member scheduled for:02/12/22 @ 10:30am ? ?Lurena Joiner RN, BSN ?[place] ?RN Care Coordinator ? ? ?Following is a copy of your plan of care:  ?Care Plan : [place] of Care  ?Updates made by Melissa Montane, RN since 01/10/2022 12:00 AM  ?  ? ?Problem: Health Management Needs related to ESRD, DMII and CHTN   ?  ? ?Long-Range Goal: Development of Plan of Care to Address Health Management Needs related to ESRD, DMII and CHTN   ?Start Date: 10/07/2021  ?Expected End Date: 02/05/2022  ?Priority: High  ?Note:   ?Current Barriers:  ?Chronic Disease Management support and education needs related to DMII and ESRD ?Film/video editor.  ?Mr. Paci has not rescheduled missed PCP appointment. He is out of two BP medications and needs refills. Today he is requesting a BP cuff and glucometer. Last BP at dialysis 144/102 and 150/92. ? ?RNCM Clinical Goal(s):  ?Patient will verbalize understanding of plan for management of DMII and ESRD as evidenced by patient verbalization of self monitoring activities ?take all medications exactly as prescribed and will call provider for medication related questions as evidenced by documentation in EMR    ?attend all scheduled medical appointments: Dialysis on T/TH/S and calling to schedule visit with PCP as evidenced by provider documentation in EMR        ?work with Education officer, museum to [employer] constraints related to lack of income,  Limited access to food, and Housing barriers related to the management of DMII and ESRD as evidenced by review of EMR and patient or social worker report     through collaboration with Consulting civil engineer, provider, and care team.   ? ?Interventions: ?Inter-disciplinary care team collaboration (see longitudinal plan of care) ?Evaluation of current treatment plan related to  self management and patient's adherence to plan as established by provider ?10/11/21: BSW contacted patient regarding applying for disability and foodstamps. Patient states he is currently on dialysis and unable to work. Patient informed patient he would have to got to [facility].gov to apply for disability or go into the office, patient stated he already has an appointment with disability. BSW informed patient in order to apply for foodstamps he can apply online or go down to social services on [address] to apply. No other resources needed at this time.  ?11/08/21: BSW completed follow up with patient. He states he has not had his appointment with disability yet and he has not applied for foodstamps yet. No other resources needed at this time. ? ?Hypertension Interventions:  (Status:  New goal.) Long Term Goal ?Last practice recorded BP readings:  ?BP Readings from Last 3 Encounters:  ?08/12/21 (!) 185/106  ?07/02/21 (!) 158/86  ?04/22/21 (!) 167/99  ?Most recent eGFR/CrCl: No results found for: EGFR  No components found for: CRCL ? ?Provided education to patient re: stroke prevention, s/s of heart attack and stroke ?Provided assistance with obtaining home blood pressure monitor via collaboration with PCP; ?Reviewed scheduled/upcoming provider appointments including:  ?Discussed complications of poorly controlled blood pressure such as heart disease, stroke, circulatory complications, vision complications, kidney impairment, sexual dysfunction ?Reviewed Cardiology note for management of HTN ?Collaboration with Cardiology regarding expired prescriptions for amlodipine and imdur ? ? ?Chronic Kidney Disease (Status: Goal on Track (progressing): YES.)  Long Term Goal  ?Last practice recorded BP readings:  ?BP Readings from Last 3 Encounters:  ?08/12/21 (!) 185/106  ?07/02/21 (!) 158/86   ?04/22/21 (!) 167/99  ?Most recent eGFR/CrCl: No results found for: EGFR  No components found for: CRCL ? ?Reviewed medications with patient and discussed importance of compliance    ?Advised patient, providing education and rationale, to monitor blood pressure daily and record, calling PCP for findings outside established parameters    ?Discussed complications of poorly controlled blood pressure such as heart disease, stroke, circulatory complications, vision complications, kidney impairment, sexual dysfunction    ?Reviewed scheduled/upcoming provider appointments including    ?Discussed plans with patient for ongoing care management follow up and provided patient with direct contact information for care management team    ? ? ? ?Diabetes:  (Status: Goal on track: NO.) Long Term Goal  ? ?Lab Results  ?Component Value Date  ? HGBA1C 7.2 (A) 10/15/2020  ?  ?Assessed patient's understanding of A1c goal: <7% ?Reviewed medications with patient and discussed importance of medication adherence;        ?Discussed plans with patient for ongoing care management follow up and provided patient with direct contact information for care management team;      ?Provided patient with written educational materials related to hypo and hyperglycemia and importance of correct treatment;       ?Reviewed scheduled/upcoming provider appointments including: Dialysis on T/TH/S and rescheduling missed PCP visit, [facility] [phone];         ?Review of patient status, including review of consultants reports, relevant laboratory and other test results, and medications completed;       ?  Assessed social determinant of health barriers;        ?Discussed the importance of regular visits with PCP ?Advised patient to take BP log, BS log, glucometer and all medications to PCP appointment ?Advised patient to discuss referral for Eye Exam with PCP ?Collaborated with PCP for BP cuff, glucometer and rescheduling missed  appointment ? ?Patient Goals/Self-Care Activities: ?Take medications as prescribed   ?Attend all scheduled provider appointments ?Call pharmacy for medication refills 3-7 days in advance of running out of medications ?Perform all self

## 2022-01-10 NOTE — Telephone Encounter (Signed)
Message from Pharmacy Dr. Lucila [name] is unable to write the prescription for the Amlodipine for the patient.  Medicaid unable to accept his prescription for medication .  Will need another Physician write for him. ?

## 2022-01-10 NOTE — Patient Outreach (Signed)
?Medicaid Managed Care   ?Nurse Care Manager Note ? ?01/10/2022 ?Name:  Jeremy [name] MRN:  [id number] DOB:  [birth year] ? ?Jeremy [name] is an 41 y.o. year old male who is a primary patient of Rick Duff, MD.  The [facility] Managed Care Coordination team was consulted for assistance with:    ?HTN ?ESRD ?DMII ? ?Jeremy [name] was given information about Medicaid Managed Care Coordination team services today. Jeremy [name] Patient agreed to services and verbal consent obtained. ? ?Engaged with patient by telephone for follow up visit in response to provider referral for case management and/or care coordination services.  ? ?Assessments/Interventions:  Review of past medical history, allergies, medications, health status, including review of consultants reports, laboratory and other test data, was performed as part of comprehensive evaluation and provision of chronic care management services. ? ?SDOH (Social Determinants of Health) assessments and interventions performed: ? ? ?Care Plan ? ?Allergies  ?Allergen Reactions  ? Acyclovir And Related Hives  ? ? ?Medications Reviewed Today   ? ? Reviewed by Melissa Montane, RN (Registered Nurse) on 01/10/22 at [place] List Status: <None>  ? ?Medication Order Taking? Sig Documenting Provider Last Dose Status Informant  ?Accu-Chek FastClix Lancets MISC [id number] Yes Check your blood sugars 4 times a day. Katherine Roan, MD Taking Active Self  ?ACCU-CHEK GUIDE test strip [id number] Yes USE TO CHECK BLOOD SUGAR 4 TIMES DAILY Winfrey, Jenne Pane, MD Taking Active Self  ?amLODipine (NORVASC) 10 MG tablet [id number] No Take 1 tablet (10 mg total) by mouth daily.  ?Patient not taking: Reported on 01/10/2022  ? Cato Mulligan, MD Not Taking Expired 11/27/21 2359 Self  ?         ?Med Note (Cree Napoli A   Fri Jan 10, 2022  9:11 AM) Needs refill  ?atorvastatin (LIPITOR) 80 MG tablet [id number] Yes Take 1 tablet (80 mg total) by mouth daily. Bensimhon, Shaune Pascal, MD Taking Active  Self  ?AURYXIA 1 GM 210 MG(Fe) tablet [id number] No Take 420 mg by mouth 3 (three) times daily.  ?Patient not taking: Reported on 01/10/2022  ? [provider] Not Taking Active Self  ?         ?Med Note (Tonjua Rossetti A   Fri Jan 10, 2022  9:12 AM) Needs refill  ?blood glucose meter kit and supplies KIT [id number] No Dispense based on patient and insurance preference. Use up to four times daily as directed. (FOR ICD-9 250.00, 250.01).  ?Patient not taking: Reported on 01/10/2022  ? Katherine Roan, MD Not Taking Active Self  ?calcitRIOL (ROCALTROL) 0.25 MCG capsule [id number] Yes Take 1 capsule (0.25 mcg total) by mouth daily.  Taking Active Self  ?         ?Med Note [place]   Tue Oct 08, 2021  4:37 PM)    ?carvedilol (COREG) 25 MG tablet [id number] Yes Take 1.5 tablets (37.5 mg total) by mouth 2 (two) times daily with a meal. [name], MD Taking Active Self  ?cephALEXin (KEFLEX) 500 MG capsule [id number] No Take 1 capsule (500 mg total) by mouth 2 (two) times daily.  ?Patient not taking: Reported on 08/06/2021  ?  Not Taking Active Self  ?cloNIDine (CATAPRES) 0.3 MG tablet [id number] Yes Take 1 tablet (0.3 mg total) by mouth 2 (two) times daily. [name], MD Taking Active Self  ?cloNIDine (CATAPRES) 0.3 MG tablet [id number] No Take 1 tablet (0.3 mg total) by mouth 2 (two) times daily as  directed  ?Patient not taking: Reported on 10/08/2021  ?  Not Taking Active Self  ?glipiZIDE (GLUCOTROL) 5 MG tablet [id number] Yes Take 1 tablet (5 mg total) by mouth daily before breakfast. Gaylan Gerold, DO Taking Active   ?hydrALAZINE (APRESOLINE) 100 MG tablet [id number] Yes Take 1 tablet (100 mg total) by mouth 3 (three) times daily. Bensimhon, Shaune Pascal, MD Taking Active Self  ?insulin aspart (NOVOLOG) 100 UNIT/ML FlexPen [id number] Yes Inject 15 Units into the skin 3 (three) times daily with meals.  ?Patient taking differently: Inject 17 Units into the skin 3 (three) times daily with meals.  ? Gaylan Gerold, DO Taking Active Self  ?         ?Med Note [facility]   Tue Oct 08, 2021  4:37 PM)    ?insulin glargine (LANTUS) 100 unit/mL SOPN [id number] Yes Inject 37 Units into the skin at bedtime. Gaylan Gerold, DO Taking Active Self  ?Insulin Pen Needle (PEN NEEDLES) 31G X 5 MM MISC [id number]  use four times daily with insulin Gaylan Gerold, DO  Expired 01/05/22 2359 Self  ?Insulin Syringe-Needle U-100 (ULTICARE INSULIN SYRINGE) 30G X 1/2" 0.5 ML MISC [id number] Yes USE 4 TIMES DAILY WITH INSULIN INJECTIONS. Iona Beard, MD Taking Active Self  ?isosorbide mononitrate (IMDUR) 30 MG 24 hr tablet [id number]  TAKE 1 TABLET BY MOUTH DAILY Loren Racer, PA-C  Expired 11/15/21 2359 Self  ?         ?Med Note (Kailly Richoux A   Fri Jan 10, 2022  9:14 AM) Needs refill  ?losartan (COZAAR) 50 MG tablet [id number] No Take 1 tablet (50 mg total) by mouth every evening.  ?Patient not taking: Reported on 10/08/2021  ?  Not Taking Active Self  ?multivitamin (RENA-VIT) TABS tablet [id number] Yes Take 1 tablet by mouth daily. [provider] Taking Active Self  ?nitroGLYCERIN (NITROSTAT) 0.4 MG SL tablet [id number] Yes PLACE 1 TABLET UNDER THE TONGUE EVERY 5 MINUTES AS NEEDED FOR CHEST PAIN. [name], MD Taking Active Self  ?         ?Med Note ([place] Jul 02, 2021  8:15 PM)    ?ondansetron (ZOFRAN ODT) 4 MG disintegrating tablet [id number] Yes Take 1 tablet (4 mg total) by mouth every 8 (eight) hours as needed for nausea or vomiting. Smoot, Leary Roca, PA-C Taking Active Self  ?oxyCODONE-acetaminophen (PERCOCET/ROXICET) 5-325 MG tablet [id number] No Take 1 tablet by mouth every 6 (six) hours as needed for severe pain.  ?Patient not taking: Reported on 08/06/2021  ? Smoot, Leary Roca, PA-C Not Taking Active Self  ?RENVELA 800 MG tablet [id number] Yes Take 1,600 mg by mouth 3 (three) times daily with meals. [provider] Taking Active Self  ?sodium bicarbonate 650 MG tablet [id number] No Take 2  tablets (1,300 mg total) by mouth 3 (three) times daily.  ?Patient not taking: Reported on 08/06/2021  ?  Not Taking Active Self  ? ?  ?  ? ?  ? ? ?Patient Active Problem List  ? Diagnosis Date Noted  ? Unspecified protein-calorie malnutrition ([place]) 10/27/2019  ? Anemia, unspecified 10/26/2019  ? Acute kidney failure, unspecified ([place]) 10/21/2019  ? Coagulation defect, unspecified ([place]) 10/21/2019  ? Personal history of COVID-19 10/21/2019  ? Secondary hyperparathyroidism of renal origin ([place]) 10/21/2019  ? COVID-19 virus infection   ? Mass of soft tissue of face 09/23/2018  ? Onychomycosis due to dermatophyte 03/29/2018  ? Anemia, iron deficiency 07/18/2016  ?  Anemia in chronic kidney disease 07/18/2016  ? 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 Suspected sleep apnea 05/24/2015  ? Chronic combined systolic and diastolic CHF (congestive heart failure) ([place]) 03/01/2015  ? Nonischemic cardiomyopathy ([place])   ? Chest pain 01/09/2015  ? Asthma   ? Hyperlipidemia 03/31/2012  ? Obesity, Class I, BMI 30-34.9 03/21/2011  ? Essential hypertension 03/21/2011  ? Gastroesophageal reflux disease 03/21/2011  ? Type 2 diabetes mellitus with diabetic nephropathy ([place]) 02/20/2011  ? ? ?Conditions to be addressed/monitored per PCP order:  HTN, DMII, and ESRD ? ?Care Plan : RN Care Manager Plan of Care  ?Updates made by Melissa Montane, RN since 01/10/2022 12:00 AM  ?  ? ?Problem: Health Management Needs related to ESRD, DMII and CHTN   ?  ? ?Long-Range Goal: Development of Plan of Care to Address Health Management Needs related to ESRD, DMII and CHTN   ?Start Date: 10/07/2021  ?Expected End Date: 02/05/2022  ?Priority: High  ?Note:   ?Current Barriers:  ?Chronic Disease Management support and education needs related to DMII and ESRD ?Film/video editor.  ?Mr. Bussey has not rescheduled missed PCP appointment. He is out of two BP medications and needs refills. Today he is  requesting a BP cuff and glucometer. Last BP at dialysis 144/102 and 150/92. ? ?RNCM Clinical Goal(s):  ?Patient will verbalize understanding of plan for management of DMII and ESRD as evidenced by patient ve

## 2022-01-11 DIAGNOSIS — N2581 Secondary hyperparathyroidism of renal origin: Secondary | ICD-10-CM | POA: Diagnosis not present

## 2022-01-11 DIAGNOSIS — D509 Iron deficiency anemia, unspecified: Secondary | ICD-10-CM | POA: Diagnosis not present

## 2022-01-11 DIAGNOSIS — Z992 Dependence on renal dialysis: Secondary | ICD-10-CM | POA: Diagnosis not present

## 2022-01-11 DIAGNOSIS — D689 Coagulation defect, unspecified: Secondary | ICD-10-CM | POA: Diagnosis not present

## 2022-01-11 DIAGNOSIS — N186 End stage renal disease: Secondary | ICD-10-CM | POA: Diagnosis not present

## 2022-01-14 DIAGNOSIS — N186 End stage renal disease: Secondary | ICD-10-CM | POA: Diagnosis not present

## 2022-01-14 DIAGNOSIS — D689 Coagulation defect, unspecified: Secondary | ICD-10-CM | POA: Diagnosis not present

## 2022-01-14 DIAGNOSIS — Z992 Dependence on renal dialysis: Secondary | ICD-10-CM | POA: Diagnosis not present

## 2022-01-14 DIAGNOSIS — N2581 Secondary hyperparathyroidism of renal origin: Secondary | ICD-10-CM | POA: Diagnosis not present

## 2022-01-14 DIAGNOSIS — D509 Iron deficiency anemia, unspecified: Secondary | ICD-10-CM | POA: Diagnosis not present

## 2022-01-16 NOTE — Telephone Encounter (Signed)
Patient sent message via my chart to contact our office to schedule an appointment. 

## 2022-01-18 DIAGNOSIS — N2581 Secondary hyperparathyroidism of renal origin: Secondary | ICD-10-CM | POA: Diagnosis not present

## 2022-01-18 DIAGNOSIS — D509 Iron deficiency anemia, unspecified: Secondary | ICD-10-CM | POA: Diagnosis not present

## 2022-01-18 DIAGNOSIS — D689 Coagulation defect, unspecified: Secondary | ICD-10-CM | POA: Diagnosis not present

## 2022-01-18 DIAGNOSIS — N186 End stage renal disease: Secondary | ICD-10-CM | POA: Diagnosis not present

## 2022-01-18 DIAGNOSIS — Z992 Dependence on renal dialysis: Secondary | ICD-10-CM | POA: Diagnosis not present

## 2022-01-21 DIAGNOSIS — Z992 Dependence on renal dialysis: Secondary | ICD-10-CM | POA: Diagnosis not present

## 2022-01-21 DIAGNOSIS — N186 End stage renal disease: Secondary | ICD-10-CM | POA: Diagnosis not present

## 2022-01-21 DIAGNOSIS — D689 Coagulation defect, unspecified: Secondary | ICD-10-CM | POA: Diagnosis not present

## 2022-01-21 DIAGNOSIS — N2581 Secondary hyperparathyroidism of renal origin: Secondary | ICD-10-CM | POA: Diagnosis not present

## 2022-01-22 ENCOUNTER — Other Ambulatory Visit (HOSPITAL_COMMUNITY): Payer: Self-pay

## 2022-01-25 DIAGNOSIS — N186 End stage renal disease: Secondary | ICD-10-CM | POA: Diagnosis not present

## 2022-01-25 DIAGNOSIS — Z992 Dependence on renal dialysis: Secondary | ICD-10-CM | POA: Diagnosis not present

## 2022-01-25 DIAGNOSIS — N2581 Secondary hyperparathyroidism of renal origin: Secondary | ICD-10-CM | POA: Diagnosis not present

## 2022-01-25 DIAGNOSIS — D689 Coagulation defect, unspecified: Secondary | ICD-10-CM | POA: Diagnosis not present

## 2022-01-28 DIAGNOSIS — Z992 Dependence on renal dialysis: Secondary | ICD-10-CM | POA: Diagnosis not present

## 2022-01-28 DIAGNOSIS — N186 End stage renal disease: Secondary | ICD-10-CM | POA: Diagnosis not present

## 2022-01-28 DIAGNOSIS — N2581 Secondary hyperparathyroidism of renal origin: Secondary | ICD-10-CM | POA: Diagnosis not present

## 2022-01-28 DIAGNOSIS — D689 Coagulation defect, unspecified: Secondary | ICD-10-CM | POA: Diagnosis not present

## 2022-01-31 ENCOUNTER — Other Ambulatory Visit (HOSPITAL_COMMUNITY): Payer: Self-pay | Admitting: Internal Medicine

## 2022-01-31 ENCOUNTER — Other Ambulatory Visit (HOSPITAL_COMMUNITY): Payer: Self-pay

## 2022-01-31 MED ORDER — HYDRALAZINE HCL 100 MG PO TABS
100.0000 mg | ORAL_TABLET | Freq: Three times a day (TID) | ORAL | 5 refills | Status: DC
  Filled 2022-01-31: qty 180, 60d supply, fill #0
  Filled 2022-05-08: qty 180, 60d supply, fill #1
  Filled 2022-08-08: qty 180, 60d supply, fill #2
  Filled 2022-10-24: qty 180, 60d supply, fill #3
  Filled 2023-01-23: qty 180, 60d supply, fill #4

## 2022-02-01 DIAGNOSIS — Z992 Dependence on renal dialysis: Secondary | ICD-10-CM | POA: Diagnosis not present

## 2022-02-01 DIAGNOSIS — N186 End stage renal disease: Secondary | ICD-10-CM | POA: Diagnosis not present

## 2022-02-01 DIAGNOSIS — N2581 Secondary hyperparathyroidism of renal origin: Secondary | ICD-10-CM | POA: Diagnosis not present

## 2022-02-01 DIAGNOSIS — D689 Coagulation defect, unspecified: Secondary | ICD-10-CM | POA: Diagnosis not present

## 2022-02-06 ENCOUNTER — Other Ambulatory Visit (HOSPITAL_COMMUNITY): Payer: Self-pay

## 2022-02-06 ENCOUNTER — Telehealth (HOSPITAL_COMMUNITY): Payer: Self-pay

## 2022-02-06 ENCOUNTER — Encounter (HOSPITAL_COMMUNITY): Payer: Self-pay | Admitting: *Deleted

## 2022-02-06 DIAGNOSIS — D509 Iron deficiency anemia, unspecified: Secondary | ICD-10-CM | POA: Diagnosis not present

## 2022-02-06 DIAGNOSIS — N186 End stage renal disease: Secondary | ICD-10-CM | POA: Diagnosis not present

## 2022-02-06 DIAGNOSIS — N2581 Secondary hyperparathyroidism of renal origin: Secondary | ICD-10-CM | POA: Diagnosis not present

## 2022-02-06 DIAGNOSIS — Z992 Dependence on renal dialysis: Secondary | ICD-10-CM | POA: Diagnosis not present

## 2022-02-06 MED ORDER — AURYXIA 1 GM 210 MG(FE) PO TABS
420.0000 mg | ORAL_TABLET | ORAL | 11 refills | Status: DC
  Filled 2022-02-06: qty 300, 30d supply, fill #0

## 2022-02-06 NOTE — Telephone Encounter (Signed)
Called to confirm/remind patient of their appointment at the [facility] on 02/07/22.   Patient reminded to bring all medications and/or complete list.  Confirmed patient has transportation. Gave directions, instructed to utilize [place] parking.  Confirmed appointment prior to ending call.

## 2022-02-07 ENCOUNTER — Encounter (HOSPITAL_COMMUNITY): Payer: Medicaid Other

## 2022-02-11 DIAGNOSIS — D509 Iron deficiency anemia, unspecified: Secondary | ICD-10-CM | POA: Diagnosis not present

## 2022-02-11 DIAGNOSIS — D689 Coagulation defect, unspecified: Secondary | ICD-10-CM | POA: Diagnosis not present

## 2022-02-11 DIAGNOSIS — N186 End stage renal disease: Secondary | ICD-10-CM | POA: Diagnosis not present

## 2022-02-11 DIAGNOSIS — Z992 Dependence on renal dialysis: Secondary | ICD-10-CM | POA: Diagnosis not present

## 2022-02-11 DIAGNOSIS — N2581 Secondary hyperparathyroidism of renal origin: Secondary | ICD-10-CM | POA: Diagnosis not present

## 2022-02-12 ENCOUNTER — Other Ambulatory Visit: Payer: Self-pay | Admitting: *Deleted

## 2022-02-12 NOTE — Patient Outreach (Signed)
Care Coordination  02/12/2022  [name] 01-23-1981 [id number]   Medicaid Managed Care   Unsuccessful Outreach Note  02/12/2022 Name: Jeremy [name] MRN: [id number] DOB: 10/04/1980  Referred by: Rick Duff, MD Reason for referral : High Risk Managed Medicaid (Unsuccessful RNCM follow up telephone outreach)   An unsuccessful telephone outreach was attempted today. The patient was referred to the case management team for assistance with care management and care coordination.   Follow Up Plan: The care management team will reach out to the patient again over the next 14 days.   Lurena Joiner RN, BSN [place]  [employer] manager

## 2022-02-12 NOTE — Patient Instructions (Signed)
Visit Information  [name]  - as a part of your Medicaid benefit, you are eligible for care management and care coordination services at no cost or copay. I was unable to reach you by phone today but would be happy to help you with your health related needs. Please feel free to call me @ [phone].   A member of the Managed Medicaid care management team will reach out to you again over the next 14 days.   Lurena Joiner RN, BSN [place]  [employer] manager

## 2022-02-15 DIAGNOSIS — D689 Coagulation defect, unspecified: Secondary | ICD-10-CM | POA: Diagnosis not present

## 2022-02-15 DIAGNOSIS — N2581 Secondary hyperparathyroidism of renal origin: Secondary | ICD-10-CM | POA: Diagnosis not present

## 2022-02-15 DIAGNOSIS — N186 End stage renal disease: Secondary | ICD-10-CM | POA: Diagnosis not present

## 2022-02-15 DIAGNOSIS — Z992 Dependence on renal dialysis: Secondary | ICD-10-CM | POA: Diagnosis not present

## 2022-02-15 DIAGNOSIS — D509 Iron deficiency anemia, unspecified: Secondary | ICD-10-CM | POA: Diagnosis not present

## 2022-02-17 ENCOUNTER — Other Ambulatory Visit: Payer: Self-pay | Admitting: *Deleted

## 2022-02-17 NOTE — Patient Outreach (Signed)
Care Coordination  02/17/2022  Jeremy [name] November 02, 1980 [id number]   Medicaid Managed Care   Unsuccessful Outreach Note  02/17/2022 Name: Jeremy [name] MRN: [id number] DOB: [birth year]  Referred by: Rick Duff, MD Reason for referral : High Risk Managed Medicaid (Unsuccessful RNCM follow up telephone outreach)   A second unsuccessful telephone outreach was attempted today. The patient was referred to the case management team for assistance with care management and care coordination.   Follow Up Plan: A HIPAA compliant phone message was left for the patient providing contact information and requesting a return call.   Lurena Joiner RN, BSN [place]  [employer] manager

## 2022-02-17 NOTE — Patient Instructions (Signed)
Visit Information  [name]  - as a part of your Medicaid benefit, you are eligible for care management and care coordination services at no cost or copay. I was unable to reach you by phone today but would be happy to help you with your health related needs. Please feel free to call me @ [phone].   A member of the Managed Medicaid care management team will reach out to you again over the next 14 days.   Lurena Joiner RN, BSN [place]   [employer] manager

## 2022-02-18 ENCOUNTER — Telehealth: Payer: Self-pay | Admitting: Student

## 2022-02-20 DIAGNOSIS — N186 End stage renal disease: Secondary | ICD-10-CM | POA: Diagnosis not present

## 2022-02-20 DIAGNOSIS — Z992 Dependence on renal dialysis: Secondary | ICD-10-CM | POA: Diagnosis not present

## 2022-02-20 DIAGNOSIS — N2581 Secondary hyperparathyroidism of renal origin: Secondary | ICD-10-CM | POA: Diagnosis not present

## 2022-02-20 DIAGNOSIS — D689 Coagulation defect, unspecified: Secondary | ICD-10-CM | POA: Diagnosis not present

## 2022-02-22 DIAGNOSIS — N186 End stage renal disease: Secondary | ICD-10-CM | POA: Diagnosis not present

## 2022-02-22 DIAGNOSIS — N2581 Secondary hyperparathyroidism of renal origin: Secondary | ICD-10-CM | POA: Diagnosis not present

## 2022-02-22 DIAGNOSIS — D689 Coagulation defect, unspecified: Secondary | ICD-10-CM | POA: Diagnosis not present

## 2022-02-22 DIAGNOSIS — Z992 Dependence on renal dialysis: Secondary | ICD-10-CM | POA: Diagnosis not present

## 2022-02-24 ENCOUNTER — Encounter (HOSPITAL_COMMUNITY): Payer: Self-pay | Admitting: *Deleted

## 2022-02-24 ENCOUNTER — Other Ambulatory Visit (HOSPITAL_COMMUNITY): Payer: Self-pay

## 2022-02-24 ENCOUNTER — Other Ambulatory Visit: Payer: Self-pay | Admitting: Student

## 2022-02-24 DIAGNOSIS — E1121 Type 2 diabetes mellitus with diabetic nephropathy: Secondary | ICD-10-CM

## 2022-02-24 DIAGNOSIS — I10 Essential (primary) hypertension: Secondary | ICD-10-CM

## 2022-02-24 MED ORDER — ACCU-CHEK GUIDE W/DEVICE KIT
PACK | 0 refills | Status: DC
  Filled 2022-02-24: qty 1, 30d supply, fill #0

## 2022-02-24 MED ORDER — FORA P20 BLOOD PRESSURE CUFF MISC
0 refills | Status: DC
Start: 2022-02-24 — End: 2023-09-04
  Filled 2022-02-24: qty 1, fill #0

## 2022-02-24 MED ORDER — ACCU-CHEK SOFTCLIX LANCETS MISC
1 refills | Status: DC
  Filled 2022-02-24: qty 100, 25d supply, fill #0

## 2022-02-24 MED ORDER — ACCU-CHEK GUIDE VI STRP
ORAL_STRIP | 4 refills | Status: DC
  Filled 2022-02-24: qty 100, 25d supply, fill #0

## 2022-02-28 ENCOUNTER — Telehealth (HOSPITAL_COMMUNITY): Payer: Self-pay

## 2022-03-03 ENCOUNTER — Encounter (HOSPITAL_COMMUNITY): Payer: Medicaid Other

## 2022-03-04 ENCOUNTER — Other Ambulatory Visit (HOSPITAL_COMMUNITY): Payer: Self-pay

## 2022-03-05 ENCOUNTER — Telehealth: Payer: Self-pay | Admitting: Student

## 2022-03-06 ENCOUNTER — Other Ambulatory Visit: Payer: Self-pay | Admitting: Student

## 2022-03-06 ENCOUNTER — Other Ambulatory Visit (HOSPITAL_COMMUNITY): Payer: Self-pay

## 2022-03-06 DIAGNOSIS — N2581 Secondary hyperparathyroidism of renal origin: Secondary | ICD-10-CM | POA: Diagnosis not present

## 2022-03-06 DIAGNOSIS — Z992 Dependence on renal dialysis: Secondary | ICD-10-CM | POA: Diagnosis not present

## 2022-03-06 DIAGNOSIS — D509 Iron deficiency anemia, unspecified: Secondary | ICD-10-CM | POA: Diagnosis not present

## 2022-03-06 DIAGNOSIS — N186 End stage renal disease: Secondary | ICD-10-CM | POA: Diagnosis not present

## 2022-03-06 DIAGNOSIS — E1121 Type 2 diabetes mellitus with diabetic nephropathy: Secondary | ICD-10-CM

## 2022-03-07 ENCOUNTER — Other Ambulatory Visit: Payer: Self-pay | Admitting: Student

## 2022-03-07 ENCOUNTER — Telehealth: Payer: Self-pay | Admitting: Student

## 2022-03-07 ENCOUNTER — Other Ambulatory Visit (HOSPITAL_COMMUNITY): Payer: Self-pay

## 2022-03-07 DIAGNOSIS — N186 End stage renal disease: Secondary | ICD-10-CM | POA: Diagnosis not present

## 2022-03-07 DIAGNOSIS — E1121 Type 2 diabetes mellitus with diabetic nephropathy: Secondary | ICD-10-CM

## 2022-03-07 DIAGNOSIS — Z992 Dependence on renal dialysis: Secondary | ICD-10-CM | POA: Diagnosis not present

## 2022-03-07 DIAGNOSIS — E1129 Type 2 diabetes mellitus with other diabetic kidney complication: Secondary | ICD-10-CM | POA: Diagnosis not present

## 2022-03-07 MED ORDER — GLIPIZIDE 5 MG PO TABS
5.0000 mg | ORAL_TABLET | Freq: Every day | ORAL | 1 refills | Status: DC
  Filled 2022-03-07: qty 90, 90d supply, fill #0
  Filled 2022-06-18: qty 90, 90d supply, fill #1

## 2022-03-11 DIAGNOSIS — Z992 Dependence on renal dialysis: Secondary | ICD-10-CM | POA: Diagnosis not present

## 2022-03-11 DIAGNOSIS — N186 End stage renal disease: Secondary | ICD-10-CM | POA: Diagnosis not present

## 2022-03-11 DIAGNOSIS — D509 Iron deficiency anemia, unspecified: Secondary | ICD-10-CM | POA: Diagnosis not present

## 2022-03-11 DIAGNOSIS — N2581 Secondary hyperparathyroidism of renal origin: Secondary | ICD-10-CM | POA: Diagnosis not present

## 2022-03-11 DIAGNOSIS — D689 Coagulation defect, unspecified: Secondary | ICD-10-CM | POA: Diagnosis not present

## 2022-03-13 DIAGNOSIS — Z992 Dependence on renal dialysis: Secondary | ICD-10-CM | POA: Diagnosis not present

## 2022-03-13 DIAGNOSIS — D689 Coagulation defect, unspecified: Secondary | ICD-10-CM | POA: Diagnosis not present

## 2022-03-13 DIAGNOSIS — D509 Iron deficiency anemia, unspecified: Secondary | ICD-10-CM | POA: Diagnosis not present

## 2022-03-13 DIAGNOSIS — N186 End stage renal disease: Secondary | ICD-10-CM | POA: Diagnosis not present

## 2022-03-13 DIAGNOSIS — N2581 Secondary hyperparathyroidism of renal origin: Secondary | ICD-10-CM | POA: Diagnosis not present

## 2022-03-18 ENCOUNTER — Other Ambulatory Visit (HOSPITAL_COMMUNITY): Payer: Self-pay

## 2022-03-18 DIAGNOSIS — Z992 Dependence on renal dialysis: Secondary | ICD-10-CM | POA: Diagnosis not present

## 2022-03-18 DIAGNOSIS — N2581 Secondary hyperparathyroidism of renal origin: Secondary | ICD-10-CM | POA: Diagnosis not present

## 2022-03-18 DIAGNOSIS — D689 Coagulation defect, unspecified: Secondary | ICD-10-CM | POA: Diagnosis not present

## 2022-03-18 DIAGNOSIS — N186 End stage renal disease: Secondary | ICD-10-CM | POA: Diagnosis not present

## 2022-03-18 DIAGNOSIS — D509 Iron deficiency anemia, unspecified: Secondary | ICD-10-CM | POA: Diagnosis not present

## 2022-03-19 ENCOUNTER — Other Ambulatory Visit (HOSPITAL_COMMUNITY): Payer: Self-pay

## 2022-03-19 ENCOUNTER — Encounter (HOSPITAL_COMMUNITY): Payer: Self-pay | Admitting: *Deleted

## 2022-03-19 MED ORDER — AURYXIA 1 GM 210 MG(FE) PO TABS
ORAL_TABLET | ORAL | 3 refills | Status: DC
  Filled 2022-03-19 – 2022-06-18 (×3): qty 300, 30d supply, fill #0

## 2022-03-22 DIAGNOSIS — N2581 Secondary hyperparathyroidism of renal origin: Secondary | ICD-10-CM | POA: Diagnosis not present

## 2022-03-22 DIAGNOSIS — N186 End stage renal disease: Secondary | ICD-10-CM | POA: Diagnosis not present

## 2022-03-22 DIAGNOSIS — D509 Iron deficiency anemia, unspecified: Secondary | ICD-10-CM | POA: Diagnosis not present

## 2022-03-22 DIAGNOSIS — D689 Coagulation defect, unspecified: Secondary | ICD-10-CM | POA: Diagnosis not present

## 2022-03-22 DIAGNOSIS — Z992 Dependence on renal dialysis: Secondary | ICD-10-CM | POA: Diagnosis not present

## 2022-03-25 ENCOUNTER — Other Ambulatory Visit: Payer: Self-pay | Admitting: Student

## 2022-03-25 ENCOUNTER — Other Ambulatory Visit (HOSPITAL_COMMUNITY): Payer: Self-pay

## 2022-03-25 DIAGNOSIS — Z794 Long term (current) use of insulin: Secondary | ICD-10-CM

## 2022-03-25 MED ORDER — NOVOLOG FLEXPEN 100 UNIT/ML ~~LOC~~ SOPN
15.0000 [IU] | PEN_INJECTOR | Freq: Three times a day (TID) | SUBCUTANEOUS | 3 refills | Status: DC
  Filled 2022-03-25: qty 39, 87d supply, fill #0
  Filled 2022-08-15: qty 39, 87d supply, fill #1

## 2022-03-25 NOTE — Telephone Encounter (Signed)
Patient has not been in clinic in over a year. He needs follow-up and is scheduled on 04/03/22.

## 2022-03-27 DIAGNOSIS — D689 Coagulation defect, unspecified: Secondary | ICD-10-CM | POA: Diagnosis not present

## 2022-03-27 DIAGNOSIS — N2581 Secondary hyperparathyroidism of renal origin: Secondary | ICD-10-CM | POA: Diagnosis not present

## 2022-03-27 DIAGNOSIS — Z992 Dependence on renal dialysis: Secondary | ICD-10-CM | POA: Diagnosis not present

## 2022-03-27 DIAGNOSIS — N186 End stage renal disease: Secondary | ICD-10-CM | POA: Diagnosis not present

## 2022-03-28 ENCOUNTER — Other Ambulatory Visit: Payer: Self-pay | Admitting: *Deleted

## 2022-03-28 NOTE — Patient Outreach (Signed)
Care Coordination  03/28/2022  Jeremy [name] Jan 28, 1981 [id number]   Medicaid Managed Care   Unsuccessful Outreach Note  03/28/2022 Name: Jeremy [name] MRN: [id number] DOB: 06-07-1981  Referred by: Rick Duff, MD Reason for referral : High Risk Managed Medicaid (Unsuccessful RNCM follow up outreach, 3rd attempt)   Third unsuccessful telephone outreach was attempted today. The patient was referred to the case management team for assistance with care management and care coordination. The patient's primary care provider has been notified of our unsuccessful attempts to make or maintain contact with the patient. The care management team is pleased to engage with this patient at any time in the future should he/she be interested in assistance from the care management team.   Follow Up Plan: We have been unable to make contact with the patient for follow up. The care management team is available to follow up with the patient after provider conversation with the patient regarding recommendation for care management engagement and subsequent re-referral to the care management team.   Lurena Joiner RN, BSN [place] RN Care Coordinator

## 2022-04-01 DIAGNOSIS — Z992 Dependence on renal dialysis: Secondary | ICD-10-CM | POA: Diagnosis not present

## 2022-04-01 DIAGNOSIS — N2581 Secondary hyperparathyroidism of renal origin: Secondary | ICD-10-CM | POA: Diagnosis not present

## 2022-04-01 DIAGNOSIS — D689 Coagulation defect, unspecified: Secondary | ICD-10-CM | POA: Diagnosis not present

## 2022-04-01 DIAGNOSIS — N186 End stage renal disease: Secondary | ICD-10-CM | POA: Diagnosis not present

## 2022-04-01 DIAGNOSIS — D509 Iron deficiency anemia, unspecified: Secondary | ICD-10-CM | POA: Diagnosis not present

## 2022-04-02 ENCOUNTER — Other Ambulatory Visit: Payer: Self-pay | Admitting: *Deleted

## 2022-04-02 DIAGNOSIS — N184 Chronic kidney disease, stage 4 (severe): Secondary | ICD-10-CM

## 2022-04-03 ENCOUNTER — Encounter: Payer: Medicaid Other | Admitting: Internal Medicine

## 2022-04-03 DIAGNOSIS — D509 Iron deficiency anemia, unspecified: Secondary | ICD-10-CM | POA: Diagnosis not present

## 2022-04-03 DIAGNOSIS — N2581 Secondary hyperparathyroidism of renal origin: Secondary | ICD-10-CM | POA: Diagnosis not present

## 2022-04-03 DIAGNOSIS — Z992 Dependence on renal dialysis: Secondary | ICD-10-CM | POA: Diagnosis not present

## 2022-04-03 DIAGNOSIS — N186 End stage renal disease: Secondary | ICD-10-CM | POA: Diagnosis not present

## 2022-04-03 DIAGNOSIS — D689 Coagulation defect, unspecified: Secondary | ICD-10-CM | POA: Diagnosis not present

## 2022-04-07 DIAGNOSIS — Z992 Dependence on renal dialysis: Secondary | ICD-10-CM | POA: Diagnosis not present

## 2022-04-07 DIAGNOSIS — E1129 Type 2 diabetes mellitus with other diabetic kidney complication: Secondary | ICD-10-CM | POA: Diagnosis not present

## 2022-04-07 DIAGNOSIS — N186 End stage renal disease: Secondary | ICD-10-CM | POA: Diagnosis not present

## 2022-04-08 DIAGNOSIS — N186 End stage renal disease: Secondary | ICD-10-CM | POA: Diagnosis not present

## 2022-04-08 DIAGNOSIS — N2581 Secondary hyperparathyroidism of renal origin: Secondary | ICD-10-CM | POA: Diagnosis not present

## 2022-04-08 DIAGNOSIS — Z992 Dependence on renal dialysis: Secondary | ICD-10-CM | POA: Diagnosis not present

## 2022-04-08 DIAGNOSIS — D689 Coagulation defect, unspecified: Secondary | ICD-10-CM | POA: Diagnosis not present

## 2022-04-12 DIAGNOSIS — D689 Coagulation defect, unspecified: Secondary | ICD-10-CM | POA: Diagnosis not present

## 2022-04-12 DIAGNOSIS — Z992 Dependence on renal dialysis: Secondary | ICD-10-CM | POA: Diagnosis not present

## 2022-04-12 DIAGNOSIS — N186 End stage renal disease: Secondary | ICD-10-CM | POA: Diagnosis not present

## 2022-04-12 DIAGNOSIS — N2581 Secondary hyperparathyroidism of renal origin: Secondary | ICD-10-CM | POA: Diagnosis not present

## 2022-04-14 ENCOUNTER — Encounter: Payer: Medicaid Other | Admitting: Student

## 2022-04-14 NOTE — Progress Notes (Deleted)
Subjective:  CC: ***  HPI:  [name] is a 41 y.o. male with a past medical history stated below and presents today for ***. Please see problem based assessment and plan for additional details.  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 01/2015 Lexiscan MV: EF 28%, inferior, inferolateral, apical ischemia;  b. 01/2015 Cath: nl cors, PCWP 18 mmHg, CO 9.38 L/min, CI 3.53 L/min/m^2.   CHF (congestive heart failure) (HCC)    Depression    Situational   ESRD (end stage renal disease) ([place])    Essential hypertension    Family history of adverse reaction to anesthesia    sister- "it was too much for her heart"   GERD (gastroesophageal reflux disease)    Hyperlipidemia    Membranous glomerulonephritis    Morbid obesity ([place])    Nonischemic cardiomyopathy ([place])    a. 01/2015 Echo: EF 20-25%, diff HK, Gr 2 DD, Triv AI, mildly dil LA and Ao root.   PONV (postoperative nausea and vomiting)    Seizures ([place]) 02/29/2020   "electralytes were out of wack."   Type II diabetes mellitus ([place])    a. 01/2015 HbA1c = 8.9.    Current Outpatient Medications on File Prior to Visit  Medication Sig Dispense Refill   Accu-Chek Softclix Lancets lancets Use as directed to check blood sugar four times daily. 400 each 1   amLODipine (NORVASC) 10 MG tablet Take 1 tablet (10 mg total) by mouth daily. (Patient not taking: Reported on 01/10/2022) 90 tablet 3   amLODipine (NORVASC) 10 MG tablet Take 1 tablet (10 mg total) by mouth daily. 90 tablet 3   atorvastatin (LIPITOR) 80 MG tablet Take 1 tablet (80 mg total) by mouth daily. 90 tablet 3   AURYXIA 1 GM 210 MG(Fe) tablet Take 420 mg by mouth 3 (three) times daily. (Patient not taking: Reported on 01/10/2022)     blood glucose meter kit and supplies KIT Dispense based on patient and insurance  preference. Use up to four times daily as directed. (FOR ICD-9 250.00, 250.01). (Patient not taking: Reported on 01/10/2022) 1 each 0   Blood Glucose Monitoring Suppl (ACCU-CHEK GUIDE) w/Device KIT Use to test blood sugar 1 kit 0   Blood Pressure Monitoring (FORA P20 BLOOD PRESSURE CUFF) MISC Check your blood pressure everyday in the morning. 1 each 0   calcitRIOL (ROCALTROL) 0.25 MCG capsule Take 1 capsule (0.25 mcg total) by mouth daily. 30 capsule 6   carvedilol (COREG) 25 MG tablet Take 1.5 tablets (37.5 mg total) by mouth 2 (two) times daily with a meal. 270 tablet 3   cephALEXin (KEFLEX) 500 MG capsule Take 1 capsule (500 mg total) by mouth 2 (two) times daily. (Patient not taking: Reported on 08/06/2021) 14 capsule 0   cloNIDine (CATAPRES) 0.3 MG tablet Take 1 tablet (0.3 mg total) by mouth 2 (two) times daily. 60 tablet 6   cloNIDine (CATAPRES) 0.3 MG tablet Take 1 tablet (0.3 mg total) by mouth 2 (two) times daily as directed (Patient not taking: Reported on 10/08/2021) 180 tablet 4   ferric citrate (AURYXIA) 1 GM 210 MG(Fe) tablet Take 2 tablets (420 mg total) by mouth as directed with meals & 2 with snacks 300 tablet 11   ferric citrate (  AURYXIA) 1 GM 210 MG(Fe) tablet Take 2 tablets (420 mg total) by mouth 3 (three) times daily with meals, AND 2 tablets (420 mg total) 2 (two) times daily with snacks 300 tablet 3   glipiZIDE (GLUCOTROL) 5 MG tablet Take 1 tablet (5 mg total) by mouth daily before breakfast. 90 tablet 1   glucose blood (ACCU-CHEK GUIDE) test strip USE TO CHECK BLOOD SUGAR 4 TIMES DAILY 400 strip 4   hydrALAZINE (APRESOLINE) 100 MG tablet Take 1 tablet (100 mg total) by mouth 3 (three) times daily. 180 tablet 5   insulin aspart (NOVOLOG FLEXPEN) 100 UNIT/ML FlexPen Inject 15 Units into the skin 3 (three) times daily with meals. 39 mL 3   insulin glargine (LANTUS) 100 unit/mL SOPN Inject 37 Units into the skin at bedtime. 33.3 mL 0   Insulin Pen Needle (PEN NEEDLES) 31G X 5 MM  MISC use four times daily with insulin 300 each 2   Insulin Syringe-Needle U-100 (ULTICARE INSULIN SYRINGE) 30G X 1/2" 0.5 ML MISC USE 4 TIMES DAILY WITH INSULIN INJECTIONS. 100 each 3   isosorbide mononitrate (IMDUR) 30 MG 24 hr tablet Take 1 tablet (30 mg total) by mouth daily. MUST HAVE FOLLOW UP APPOINTMENT FOR FURTHER REFILLS 90 tablet 0   losartan (COZAAR) 50 MG tablet Take 1 tablet (50 mg total) by mouth every evening. (Patient not taking: Reported on 10/08/2021) 90 tablet 3   multivitamin (RENA-VIT) TABS tablet Take 1 tablet by mouth daily.     nitroGLYCERIN (NITROSTAT) 0.4 MG SL tablet PLACE 1 TABLET UNDER THE TONGUE EVERY 5 MINUTES AS NEEDED FOR CHEST PAIN. 25 tablet 3   ondansetron (ZOFRAN-ODT) 4 MG disintegrating tablet Take 1 tablet (4 mg total) by mouth every 8 (eight) hours as needed for nausea or vomiting. 20 tablet 0   oxyCODONE-acetaminophen (PERCOCET/ROXICET) 5-325 MG tablet Take 1 tablet by mouth every 6 (six) hours as needed for severe pain. (Patient not taking: Reported on 08/06/2021) 10 tablet 0   RENVELA 800 MG tablet Take 1,600 mg by mouth 3 (three) times daily with meals.     sodium bicarbonate 650 MG tablet Take 2 tablets (1,300 mg total) by mouth 3 (three) times daily. (Patient not taking: Reported on 08/06/2021) 90 tablet 5   No current facility-administered medications on file prior to visit.    Family History  Problem Relation Age of Onset   Diabetes Mellitus II Mother        died @ 11.   Gastric cancer Mother    CAD Father        died @ 35.   Heart attack Father    Congestive Heart Failure Father    Diabetes Mellitus II Sister    CAD Sister        s/p PCI - age 19.    Social History   Socioeconomic History   Marital status: Single    Spouse name: Not on file   Number of children: Not on file   Years of education: Not on file   Highest education level: Some college, no degree  Occupational History   Not on file  Tobacco Use   Smoking status: Never    Smokeless tobacco: Never  Vaping Use   Vaping Use: Never used  Substance and Sexual Activity   Alcohol use: No    Alcohol/week: 0.0 standard drinks of alcohol   Drug use: Yes    Types: Marijuana    Comment: last used 09/15/2020   Sexual activity: Yes  Partners: Female  Other Topics Concern   Not on file  Social History Narrative   Lives in [place] by himself.  Currently in [facility].   Caffeine- none       Social Determinants of Health   Financial Resource Strain: Not on file  Food Insecurity: Food Insecurity Present (10/07/2021)   Hunger Vital Sign    Worried About Running Out of Food in the Last Year: Sometimes true    Ran Out of Food in the Last Year: Sometimes true  Transportation Needs: No Transportation Needs (10/07/2021)   PRAPARE - Hydrologist (Medical): No    Lack of Transportation (Non-Medical): No  Physical Activity: Insufficiently Active (11/27/2021)   Exercise Vital Sign    Days of Exercise per Week: 2 days    Minutes of Exercise per Session: 30 min  Stress: Stress Concern Present (11/27/2021)   [place]    Feeling of Stress : To some extent  Social Connections: Not on file  Intimate Partner Violence: Not on file    Review of Systems: ROS negative except for what is noted on the assessment and plan.  Objective:  There were no vitals filed for this visit.  Physical Exam: Constitutional: well-appearing *** sitting in ***, in no acute distress HENT: normocephalic atraumatic, mucous membranes moist Eyes: conjunctiva non-erythematous Neck: supple Cardiovascular: regular rate and rhythm, no m/r/g Pulmonary/Chest: normal work of breathing on room air, lungs clear to auscultation bilaterally Abdominal: soft, non-tender, non-distended MSK: normal bulk and tone Neurological: alert & oriented x 3, 5/5 strength in bilateral upper and lower extremities, normal  gait Skin: warm and dry Psych: ***     Assessment & Plan:   No problem-specific Assessment & Plan notes found for this encounter.    No problem-specific Assessment & Plan notes found for this encounter.    DM - glipizide - Lantus 37 u QHS - Novolog 15u TID   CHF Nonischemic cardiomyopathy, likely in the setting on long standing HTN.  EF was 20-25% in 2006. Last ECHo 2020 16-10% normal RV systolic function Established patient with the advanced heart failure clinic; patient missed last two appointment. Last follow up with them on 03/29/2021: Carvedilol was increased to 37.5 mg  -BMP todaybid. *** evidence of volume overload today. Patient to return to AHF clinic with plan to repeat echo.   HTN Last BP at HD on 8/1 122/77.   Anemia Last cbc at HD 11.2 - On auryxia  - CBC today  HLD  CKD Nephrotic syndrome:   - Sevelamer - Nabicarbonate 1300 mg BID -BMP today   Patient {GC/GE:[id number]::"discussed with","seen with"} Dr. {RUEAV:[id number]::"JYNWGNFA","O. Hoffman","Mullen","Narendra","Machen","Vincent","Guilloud","Lau"}

## 2022-04-16 DIAGNOSIS — Z992 Dependence on renal dialysis: Secondary | ICD-10-CM | POA: Diagnosis not present

## 2022-04-16 DIAGNOSIS — D689 Coagulation defect, unspecified: Secondary | ICD-10-CM | POA: Diagnosis not present

## 2022-04-16 DIAGNOSIS — N2581 Secondary hyperparathyroidism of renal origin: Secondary | ICD-10-CM | POA: Diagnosis not present

## 2022-04-16 DIAGNOSIS — N186 End stage renal disease: Secondary | ICD-10-CM | POA: Diagnosis not present

## 2022-04-19 DIAGNOSIS — N186 End stage renal disease: Secondary | ICD-10-CM | POA: Diagnosis not present

## 2022-04-19 DIAGNOSIS — N2581 Secondary hyperparathyroidism of renal origin: Secondary | ICD-10-CM | POA: Diagnosis not present

## 2022-04-19 DIAGNOSIS — D689 Coagulation defect, unspecified: Secondary | ICD-10-CM | POA: Diagnosis not present

## 2022-04-19 DIAGNOSIS — Z992 Dependence on renal dialysis: Secondary | ICD-10-CM | POA: Diagnosis not present

## 2022-04-23 ENCOUNTER — Ambulatory Visit (HOSPITAL_COMMUNITY): Payer: Medicaid Other

## 2022-04-23 ENCOUNTER — Ambulatory Visit: Payer: Medicaid Other | Admitting: Vascular Surgery

## 2022-04-23 ENCOUNTER — Ambulatory Visit (HOSPITAL_COMMUNITY): Payer: Medicaid Other | Attending: Vascular Surgery

## 2022-04-29 DIAGNOSIS — D509 Iron deficiency anemia, unspecified: Secondary | ICD-10-CM | POA: Diagnosis not present

## 2022-04-29 DIAGNOSIS — Z992 Dependence on renal dialysis: Secondary | ICD-10-CM | POA: Diagnosis not present

## 2022-04-29 DIAGNOSIS — N186 End stage renal disease: Secondary | ICD-10-CM | POA: Diagnosis not present

## 2022-04-29 DIAGNOSIS — N2581 Secondary hyperparathyroidism of renal origin: Secondary | ICD-10-CM | POA: Diagnosis not present

## 2022-04-29 DIAGNOSIS — D689 Coagulation defect, unspecified: Secondary | ICD-10-CM | POA: Diagnosis not present

## 2022-05-01 DIAGNOSIS — Z992 Dependence on renal dialysis: Secondary | ICD-10-CM | POA: Diagnosis not present

## 2022-05-01 DIAGNOSIS — D689 Coagulation defect, unspecified: Secondary | ICD-10-CM | POA: Diagnosis not present

## 2022-05-01 DIAGNOSIS — N2581 Secondary hyperparathyroidism of renal origin: Secondary | ICD-10-CM | POA: Diagnosis not present

## 2022-05-01 DIAGNOSIS — N186 End stage renal disease: Secondary | ICD-10-CM | POA: Diagnosis not present

## 2022-05-01 DIAGNOSIS — D509 Iron deficiency anemia, unspecified: Secondary | ICD-10-CM | POA: Diagnosis not present

## 2022-05-05 ENCOUNTER — Other Ambulatory Visit (HOSPITAL_COMMUNITY): Payer: Self-pay | Admitting: Cardiology

## 2022-05-05 ENCOUNTER — Other Ambulatory Visit (HOSPITAL_COMMUNITY): Payer: Self-pay

## 2022-05-05 MED ORDER — CARVEDILOL 25 MG PO TABS
37.5000 mg | ORAL_TABLET | Freq: Two times a day (BID) | ORAL | 0 refills | Status: DC
  Filled 2022-05-05: qty 270, 90d supply, fill #0

## 2022-05-06 DIAGNOSIS — D509 Iron deficiency anemia, unspecified: Secondary | ICD-10-CM | POA: Diagnosis not present

## 2022-05-06 DIAGNOSIS — N186 End stage renal disease: Secondary | ICD-10-CM | POA: Diagnosis not present

## 2022-05-06 DIAGNOSIS — N2581 Secondary hyperparathyroidism of renal origin: Secondary | ICD-10-CM | POA: Diagnosis not present

## 2022-05-06 DIAGNOSIS — D689 Coagulation defect, unspecified: Secondary | ICD-10-CM | POA: Diagnosis not present

## 2022-05-06 DIAGNOSIS — Z992 Dependence on renal dialysis: Secondary | ICD-10-CM | POA: Diagnosis not present

## 2022-05-08 ENCOUNTER — Other Ambulatory Visit (HOSPITAL_COMMUNITY): Payer: Self-pay

## 2022-05-08 ENCOUNTER — Other Ambulatory Visit (HOSPITAL_COMMUNITY): Payer: Self-pay | Admitting: Internal Medicine

## 2022-05-08 DIAGNOSIS — N186 End stage renal disease: Secondary | ICD-10-CM | POA: Diagnosis not present

## 2022-05-08 DIAGNOSIS — Z992 Dependence on renal dialysis: Secondary | ICD-10-CM | POA: Diagnosis not present

## 2022-05-08 DIAGNOSIS — E1129 Type 2 diabetes mellitus with other diabetic kidney complication: Secondary | ICD-10-CM | POA: Diagnosis not present

## 2022-05-08 DIAGNOSIS — E7849 Other hyperlipidemia: Secondary | ICD-10-CM

## 2022-05-08 DIAGNOSIS — D689 Coagulation defect, unspecified: Secondary | ICD-10-CM | POA: Diagnosis not present

## 2022-05-08 DIAGNOSIS — N2581 Secondary hyperparathyroidism of renal origin: Secondary | ICD-10-CM | POA: Diagnosis not present

## 2022-05-08 DIAGNOSIS — D509 Iron deficiency anemia, unspecified: Secondary | ICD-10-CM | POA: Diagnosis not present

## 2022-05-08 MED ORDER — ATORVASTATIN CALCIUM 80 MG PO TABS
80.0000 mg | ORAL_TABLET | Freq: Every day | ORAL | 0 refills | Status: DC
  Filled 2022-05-08: qty 90, 90d supply, fill #0

## 2022-05-13 DIAGNOSIS — N2581 Secondary hyperparathyroidism of renal origin: Secondary | ICD-10-CM | POA: Diagnosis not present

## 2022-05-13 DIAGNOSIS — Z992 Dependence on renal dialysis: Secondary | ICD-10-CM | POA: Diagnosis not present

## 2022-05-13 DIAGNOSIS — D689 Coagulation defect, unspecified: Secondary | ICD-10-CM | POA: Diagnosis not present

## 2022-05-13 DIAGNOSIS — D509 Iron deficiency anemia, unspecified: Secondary | ICD-10-CM | POA: Diagnosis not present

## 2022-05-13 DIAGNOSIS — N186 End stage renal disease: Secondary | ICD-10-CM | POA: Diagnosis not present

## 2022-05-17 DIAGNOSIS — D689 Coagulation defect, unspecified: Secondary | ICD-10-CM | POA: Diagnosis not present

## 2022-05-17 DIAGNOSIS — D509 Iron deficiency anemia, unspecified: Secondary | ICD-10-CM | POA: Diagnosis not present

## 2022-05-17 DIAGNOSIS — N2581 Secondary hyperparathyroidism of renal origin: Secondary | ICD-10-CM | POA: Diagnosis not present

## 2022-05-17 DIAGNOSIS — Z992 Dependence on renal dialysis: Secondary | ICD-10-CM | POA: Diagnosis not present

## 2022-05-17 DIAGNOSIS — N186 End stage renal disease: Secondary | ICD-10-CM | POA: Diagnosis not present

## 2022-05-20 DIAGNOSIS — N2581 Secondary hyperparathyroidism of renal origin: Secondary | ICD-10-CM | POA: Diagnosis not present

## 2022-05-20 DIAGNOSIS — D509 Iron deficiency anemia, unspecified: Secondary | ICD-10-CM | POA: Diagnosis not present

## 2022-05-20 DIAGNOSIS — Z992 Dependence on renal dialysis: Secondary | ICD-10-CM | POA: Diagnosis not present

## 2022-05-20 DIAGNOSIS — N186 End stage renal disease: Secondary | ICD-10-CM | POA: Diagnosis not present

## 2022-05-20 DIAGNOSIS — D689 Coagulation defect, unspecified: Secondary | ICD-10-CM | POA: Diagnosis not present

## 2022-05-24 DIAGNOSIS — D509 Iron deficiency anemia, unspecified: Secondary | ICD-10-CM | POA: Diagnosis not present

## 2022-05-24 DIAGNOSIS — D689 Coagulation defect, unspecified: Secondary | ICD-10-CM | POA: Diagnosis not present

## 2022-05-24 DIAGNOSIS — N186 End stage renal disease: Secondary | ICD-10-CM | POA: Diagnosis not present

## 2022-05-24 DIAGNOSIS — N2581 Secondary hyperparathyroidism of renal origin: Secondary | ICD-10-CM | POA: Diagnosis not present

## 2022-05-24 DIAGNOSIS — Z992 Dependence on renal dialysis: Secondary | ICD-10-CM | POA: Diagnosis not present

## 2022-05-27 ENCOUNTER — Other Ambulatory Visit (HOSPITAL_COMMUNITY): Payer: Self-pay

## 2022-05-28 ENCOUNTER — Other Ambulatory Visit (HOSPITAL_COMMUNITY): Payer: Self-pay

## 2022-05-28 ENCOUNTER — Other Ambulatory Visit (HOSPITAL_COMMUNITY): Payer: Medicaid Other

## 2022-05-28 ENCOUNTER — Ambulatory Visit: Payer: Medicaid Other | Admitting: Vascular Surgery

## 2022-05-28 ENCOUNTER — Encounter (HOSPITAL_COMMUNITY): Payer: Medicaid Other

## 2022-06-04 ENCOUNTER — Ambulatory Visit (HOSPITAL_COMMUNITY)
Admission: RE | Admit: 2022-06-04 | Discharge: 2022-06-04 | Disposition: A | Discharge status: Home / Self Care | Payer: Medicaid Other | Source: Ambulatory Visit | Attending: Vascular Surgery | Admitting: Vascular Surgery

## 2022-06-04 ENCOUNTER — Ambulatory Visit: Payer: Medicaid Other | Admitting: Vascular Surgery

## 2022-06-04 ENCOUNTER — Other Ambulatory Visit: Payer: Self-pay | Admitting: *Deleted

## 2022-06-04 ENCOUNTER — Encounter: Payer: Self-pay | Admitting: Vascular Surgery

## 2022-06-04 ENCOUNTER — Ambulatory Visit (INDEPENDENT_AMBULATORY_CARE_PROVIDER_SITE_OTHER)
Admission: RE | Admit: 2022-06-04 | Discharge: 2022-06-04 | Disposition: A | Discharge status: Home / Self Care | Payer: Medicaid Other | Source: Ambulatory Visit | Attending: Vascular Surgery | Admitting: Vascular Surgery

## 2022-06-04 VITALS — BP 93/65 | HR 55 | Temp 97.8°F | Resp 20 | Ht 77.0 in | Wt 276.0 lb

## 2022-06-04 DIAGNOSIS — N184 Chronic kidney disease, stage 4 (severe): Secondary | ICD-10-CM | POA: Insufficient documentation

## 2022-06-04 DIAGNOSIS — N186 End stage renal disease: Secondary | ICD-10-CM | POA: Diagnosis not present

## 2022-06-04 NOTE — Progress Notes (Signed)
Patient ID: Jeremy [name], male   DOB: Feb 23, 1981, 41 y.o.   MRN: [id number]  Reason for Consult: Follow-up   Referred by Rick Duff, MD  Subjective:     HPI:  Jeremy [name] is a 41 y.o. male left-hand-dominant with a history of right arm AV fistula.  We did perform a fistulogram plan for upper arm conversion but this was never scheduled.  He currently dialyzes via left IJ catheter.  He does not have any right hand issues.  He does not take any blood thinners.  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 01/2015 Lexiscan MV: EF 28%, inferior, inferolateral, apical ischemia;  b. 01/2015 Cath: nl cors, PCWP 18 mmHg, CO 9.38 L/min, CI 3.53 L/min/m^2.   CHF (congestive heart failure) (HCC)    Depression    Situational   ESRD (end stage renal disease) ([place])    Essential hypertension    Family history of adverse reaction to anesthesia    sister- "it was too much for her heart"   GERD (gastroesophageal reflux disease)    Hyperlipidemia    Membranous glomerulonephritis    Morbid obesity ([place])    Nonischemic cardiomyopathy ([place])    a. 01/2015 Echo: EF 20-25%, diff HK, Gr 2 DD, Triv AI, mildly dil LA and Ao root.   PONV (postoperative nausea and vomiting)    Seizures ([place]) 02/29/2020   "electralytes were out of wack."   Type II diabetes mellitus ([place])    a. 01/2015 HbA1c = 8.9.   Family History  Problem Relation Age of Onset   Diabetes Mellitus II Mother        died @ 68.   Gastric cancer Mother    CAD Father        died @ 44.   Heart attack Father    Congestive Heart Failure Father    Diabetes Mellitus II Sister    CAD Sister        s/p PCI - age 60.   Past Surgical History:  Procedure Laterality Date   A/V FISTULAGRAM N/A 08/12/2021   Procedure: A/V FISTULAGRAM;  Surgeon: Waynetta Sandy, MD;   Location: [facility];  Service: Cardiovascular;  Laterality: N/A;   AV FISTULA PLACEMENT Right 12/18/2020   Procedure: RIGHT ARM RADIOCEPHALIC  ARTERIOVENOUS (AV) FISTULA CREATION;  Surgeon: Waynetta Sandy, MD;  Location: [facility];  Service: Vascular;  Laterality: Right;   CARDIAC CATHETERIZATION N/A 02/01/2015   Procedure: Right/Left Heart Cath and Coronary Angiography;  Surgeon: Josue Hector, MD;  Location: [facility];  Service: Cardiovascular;  Laterality: N/A;   IR FLUORO GUIDE CV LINE RIGHT  10/11/2019   IR US GUIDE VASC ACCESS RIGHT  10/11/2019   THORACOTOMY Left 11/08/2013   Procedure: LEFT THORACOTOMY;  Surgeon: Ivin Poot, MD;  Location: [facility];  Service: Thoracic;  Laterality: Left;    Short Social History:  Social History   Tobacco Use   Smoking status: Never   Smokeless tobacco: Never  Substance Use Topics   Alcohol use: No    Alcohol/week: 0.0 standard drinks of alcohol    Allergies  Allergen Reactions   Acyclovir And Related Hives    Current Outpatient Medications  Medication Sig Dispense Refill   Accu-Chek Softclix  Lancets lancets Use as directed to check blood sugar four times daily. 400 each 1   amLODipine (NORVASC) 10 MG tablet Take 1 tablet (10 mg total) by mouth daily. 90 tablet 3   atorvastatin (LIPITOR) 80 MG tablet Take 1 tablet (80 mg total) by mouth daily. NEEDS FOLLOW UP APPOINTMENT FOR ANYMORE REFILLS 90 tablet 0   AURYXIA 1 GM 210 MG(Fe) tablet Take 420 mg by mouth 3 (three) times daily.     blood glucose meter kit and supplies KIT Dispense based on patient and insurance preference. Use up to four times daily as directed. (FOR ICD-9 250.00, 250.01). 1 each 0   Blood Glucose Monitoring Suppl (ACCU-CHEK GUIDE) w/Device KIT Use to test blood sugar 1 kit 0   Blood Pressure Monitoring (FORA P20 BLOOD PRESSURE CUFF) MISC Check your blood pressure everyday in the morning. 1 each 0   calcitRIOL (ROCALTROL) 0.25 MCG capsule Take 1 capsule  (0.25 mcg total) by mouth daily. 30 capsule 6   carvedilol (COREG) 25 MG tablet Take 1.5 tablets (37.5 mg total) by mouth 2 (two) times daily with a meal. Need to schedule an appointment for further refills 270 tablet 0   cephALEXin (KEFLEX) 500 MG capsule Take 1 capsule (500 mg total) by mouth 2 (two) times daily. 14 capsule 0   cloNIDine (CATAPRES) 0.3 MG tablet Take 1 tablet (0.3 mg total) by mouth 2 (two) times daily. 60 tablet 6   cloNIDine (CATAPRES) 0.3 MG tablet Take 1 tablet (0.3 mg total) by mouth 2 (two) times daily as directed 180 tablet 4   ferric citrate (AURYXIA) 1 GM 210 MG(Fe) tablet Take 2 tablets (420 mg total) by mouth as directed with meals & 2 with snacks 300 tablet 11   ferric citrate (AURYXIA) 1 GM 210 MG(Fe) tablet Take 2 tablets (420 mg total) by mouth 3 (three) times daily with meals, AND 2 tablets (420 mg total) 2 (two) times daily with snacks 300 tablet 3   glipiZIDE (GLUCOTROL) 5 MG tablet Take 1 tablet (5 mg total) by mouth daily before breakfast. 90 tablet 1   glucose blood (ACCU-CHEK GUIDE) test strip USE TO CHECK BLOOD SUGAR 4 TIMES DAILY 400 strip 4   hydrALAZINE (APRESOLINE) 100 MG tablet Take 1 tablet (100 mg total) by mouth 3 (three) times daily. 180 tablet 5   insulin aspart (NOVOLOG FLEXPEN) 100 UNIT/ML FlexPen Inject 15 Units into the skin 3 (three) times daily with meals. 39 mL 3   Insulin Syringe-Needle U-100 (ULTICARE INSULIN SYRINGE) 30G X 1/2" 0.5 ML MISC USE 4 TIMES DAILY WITH INSULIN INJECTIONS. 100 each 3   isosorbide mononitrate (IMDUR) 30 MG 24 hr tablet Take 1 tablet (30 mg total) by mouth daily. MUST HAVE FOLLOW UP APPOINTMENT FOR FURTHER REFILLS 90 tablet 0   losartan (COZAAR) 50 MG tablet Take 1 tablet (50 mg total) by mouth every evening. 90 tablet 3   multivitamin (RENA-VIT) TABS tablet Take 1 tablet by mouth daily.     nitroGLYCERIN (NITROSTAT) 0.4 MG SL tablet PLACE 1 TABLET UNDER THE TONGUE EVERY 5 MINUTES AS NEEDED FOR CHEST PAIN. 25 tablet  3   ondansetron (ZOFRAN-ODT) 4 MG disintegrating tablet Take 1 tablet (4 mg total) by mouth every 8 (eight) hours as needed for nausea or vomiting. 20 tablet 0   oxyCODONE-acetaminophen (PERCOCET/ROXICET) 5-325 MG tablet Take 1 tablet by mouth every 6 (six) hours as needed for severe pain. 10 tablet 0   RENVELA 800 MG tablet Take 1,600  mg by mouth 3 (three) times daily with meals.     sodium bicarbonate 650 MG tablet Take 2 tablets (1,300 mg total) by mouth 3 (three) times daily. 90 tablet 5   amLODipine (NORVASC) 10 MG tablet Take 1 tablet (10 mg total) by mouth daily. (Patient not taking: Reported on 01/10/2022) 90 tablet 3   insulin glargine (LANTUS) 100 unit/mL SOPN Inject 37 Units into the skin at bedtime. 33.3 mL 0   Insulin Pen Needle (PEN NEEDLES) 31G X 5 MM MISC use four times daily with insulin 300 each 2   No current facility-administered medications for this visit.    Review of Systems  Constitutional:  Constitutional negative. HENT: HENT negative.  Eyes: Eyes negative.  Respiratory: Respiratory negative.  Cardiovascular: Cardiovascular negative.  GI: Gastrointestinal negative.  Musculoskeletal: Musculoskeletal negative.  Skin: Skin negative.  Neurological: Neurological negative. Hematologic: Hematologic/lymphatic negative.  Psychiatric: Psychiatric negative.        Objective:  Objective  Vitals:   06/04/22 1134  BP: 93/65  Pulse: (!) 55  Resp: 20  Temp: 97.8 F (36.6 C)  SpO2: 97%     Physical Exam HENT:     Head: Normocephalic.     Nose: Nose normal.  Eyes:     Pupils: Pupils are equal, round, and reactive to light.  Cardiovascular:     Rate and Rhythm: Normal rate.     Pulses:          Radial pulses are 0 on the right side and 2+ on the left side.  Pulmonary:     Effort: Pulmonary effort is normal.  Abdominal:     General: Abdomen is flat.  Skin:    Capillary Refill: Capillary refill takes less than 2 seconds.  Neurological:     General: No focal  deficit present.     Mental Status: He is alert.  Psychiatric:        Mood and Affect: Mood normal.        Thought Content: Thought content normal.     Data: Right Pre-Dialysis Findings:  +-----------------------+----------+--------------------+---------+--------  +  Location               PSV (cm/s)Intralum. Diam. (cm)Waveform  Comments  +-----------------------+----------+--------------------+---------+--------  +  Brachial Antecub. fossa89        0.66                triphasic            +-----------------------+----------+--------------------+---------+--------  +  Radial Art at Wrist    48        0.24                biphasic             +-----------------------+----------+--------------------+---------+--------  +  Ulnar Art at Wrist     90        0.32                triphasic            +-----------------------+----------+--------------------+---------+--------    Left Pre-Dialysis Findings:  +-----------------------+----------+--------------------+---------+--------  +  Location               PSV (cm/s)Intralum. Diam. (cm)Waveform  Comments  +-----------------------+----------+--------------------+---------+--------  +  Brachial Antecub. fossa80        0.54                triphasic            +-----------------------+----------+--------------------+---------+--------  +  Radial Art at Wrist    62        0.33                triphasic            +-----------------------+----------+--------------------+---------+--------  +  Ulnar Art at Wrist     51        0.35                biphasic             +-----------------------+----------+--------------------+---------+--------  +    Summary:     Right: No obstruction visualized in the right upper extremity.  Left: No obstruction visualized in the left upper extremity.   Right Cephalic   Diameter (cm)Depth (cm)   Findings      +-----------------+-------------+----------+--------------+  Shoulder             0.41                               +-----------------+-------------+----------+--------------+  Prox upper arm       0.38                               +-----------------+-------------+----------+--------------+  Mid upper arm        0.31                 branching     +-----------------+-------------+----------+--------------+  Dist upper arm       0.37                               +-----------------+-------------+----------+--------------+  Antecubital fossa    0.70               venous webbing  +-----------------+-------------+----------+--------------+  Prox forearm         0.29                               +-----------------+-------------+----------+--------------+  Mid forearm          0.27                               +-----------------+-------------+----------+--------------+  Dist forearm         0.19                               +-----------------+-------------+----------+--------------+   +-----------------+-------------+----------+--------+  Right Basilic    Diameter (cm)Depth (cm)Findings  +-----------------+-------------+----------+--------+  Prox upper arm       0.33                         +-----------------+-------------+----------+--------+  Mid upper arm        0.32                         +-----------------+-------------+----------+--------+  Dist upper arm       0.35                         +-----------------+-------------+----------+--------+  Antecubital fossa    0.32                         +-----------------+-------------+----------+--------+  Prox forearm         0.41                         +-----------------+-------------+----------+--------+   +-----------------+-------------+----------+---------+  Left Cephalic    Diameter (cm)Depth (cm)Findings    +-----------------+-------------+----------+---------+  Shoulder             0.33                          +-----------------+-------------+----------+---------+  Prox upper arm       0.32               branching  +-----------------+-------------+----------+---------+  Mid upper arm        0.37                          +-----------------+-------------+----------+---------+  Dist upper arm       0.41                          +-----------------+-------------+----------+---------+  Antecubital fossa    0.52                          +-----------------+-------------+----------+---------+  Prox forearm         0.48               branching  +-----------------+-------------+----------+---------+  Mid forearm          0.30                          +-----------------+-------------+----------+---------+  Dist forearm         0.35                          +-----------------+-------------+----------+---------+   +-----------------+-------------+----------+--------+  Left Basilic     Diameter (cm)Depth (cm)Findings  +-----------------+-------------+----------+--------+  Prox upper arm       0.34                         +-----------------+-------------+----------+--------+  Mid upper arm        0.31                         +-----------------+-------------+----------+--------+  Dist upper arm       0.34                         +-----------------+-------------+----------+--------+  Antecubital fossa    0.35                         +-----------------+-------------+----------+--------+  Prox forearm         0.35                         +-----------------+-------------+----------+--------+   Summary: Right: Patent basilic vein.         Vennous webbing observed at the antecubital fossa segment of         the cephalic vein. The remainder of the cephalic vein is         patent.  Left: Patent and compressible cephalic and basiic  veins.      Assessment/Plan:    41 year old male  with history of incisional disease dialyzing via catheter with previous history of right arm AV fistula that has never worked.  In reviewing his previous fistulogram and today's vein mapping appears that he has suitable cephalic and/or basilic vein of the right upper arm we will plan for new fistula creation on a nondialysis day in the near future.  We discussed the risk of this alternatives he demonstrates good understanding.    Waynetta Sandy MD [facility]

## 2022-06-07 DIAGNOSIS — Z992 Dependence on renal dialysis: Secondary | ICD-10-CM | POA: Diagnosis not present

## 2022-06-07 DIAGNOSIS — N186 End stage renal disease: Secondary | ICD-10-CM | POA: Diagnosis not present

## 2022-06-07 DIAGNOSIS — E1129 Type 2 diabetes mellitus with other diabetic kidney complication: Secondary | ICD-10-CM | POA: Diagnosis not present

## 2022-06-10 ENCOUNTER — Other Ambulatory Visit (HOSPITAL_COMMUNITY): Payer: Self-pay

## 2022-06-11 DIAGNOSIS — Z992 Dependence on renal dialysis: Secondary | ICD-10-CM | POA: Diagnosis not present

## 2022-06-11 DIAGNOSIS — D509 Iron deficiency anemia, unspecified: Secondary | ICD-10-CM | POA: Diagnosis not present

## 2022-06-11 DIAGNOSIS — D689 Coagulation defect, unspecified: Secondary | ICD-10-CM | POA: Diagnosis not present

## 2022-06-11 DIAGNOSIS — N186 End stage renal disease: Secondary | ICD-10-CM | POA: Diagnosis not present

## 2022-06-11 DIAGNOSIS — N2581 Secondary hyperparathyroidism of renal origin: Secondary | ICD-10-CM | POA: Diagnosis not present

## 2022-06-14 DIAGNOSIS — D509 Iron deficiency anemia, unspecified: Secondary | ICD-10-CM | POA: Diagnosis not present

## 2022-06-14 DIAGNOSIS — Z992 Dependence on renal dialysis: Secondary | ICD-10-CM | POA: Diagnosis not present

## 2022-06-14 DIAGNOSIS — N2581 Secondary hyperparathyroidism of renal origin: Secondary | ICD-10-CM | POA: Diagnosis not present

## 2022-06-14 DIAGNOSIS — N186 End stage renal disease: Secondary | ICD-10-CM | POA: Diagnosis not present

## 2022-06-14 DIAGNOSIS — D689 Coagulation defect, unspecified: Secondary | ICD-10-CM | POA: Diagnosis not present

## 2022-06-18 ENCOUNTER — Other Ambulatory Visit (HOSPITAL_COMMUNITY): Payer: Self-pay

## 2022-06-18 DIAGNOSIS — D509 Iron deficiency anemia, unspecified: Secondary | ICD-10-CM | POA: Diagnosis not present

## 2022-06-18 DIAGNOSIS — D689 Coagulation defect, unspecified: Secondary | ICD-10-CM | POA: Diagnosis not present

## 2022-06-18 DIAGNOSIS — N2581 Secondary hyperparathyroidism of renal origin: Secondary | ICD-10-CM | POA: Diagnosis not present

## 2022-06-18 DIAGNOSIS — Z992 Dependence on renal dialysis: Secondary | ICD-10-CM | POA: Diagnosis not present

## 2022-06-18 DIAGNOSIS — N186 End stage renal disease: Secondary | ICD-10-CM | POA: Diagnosis not present

## 2022-06-19 ENCOUNTER — Telehealth: Payer: Self-pay

## 2022-06-19 NOTE — Progress Notes (Signed)
I spoke to  Jeremy [name], he reported that he is not having surgery, he has an infection in the right arm.  I asked if had notified the Dr, he said no, he told someone who call about medication. I  told patient that I would notify the office. I called [facility] office and left a voice message on the nurse's line.

## 2022-06-19 NOTE — Telephone Encounter (Signed)
Received a call from preadmission that patient was canceling his right arm AVF placement on tomorrow because he has an infection in right arm.  Spoke with patient who reports having a boil underneath right arm for 2 days. States he gets them periodically when he changes deodorant and they will usually go away with a hot compress or require antibiotics if last longer. Patient requested to postpone surgery. Rescheduled for next available on 07/04/22. Reviewed time/instructions. He verbalized understanding.

## 2022-06-20 ENCOUNTER — Ambulatory Visit (HOSPITAL_COMMUNITY): Admission: RE | Admit: 2022-06-20 | Payer: Medicaid Other | Source: Home / Self Care | Admitting: Vascular Surgery

## 2022-06-20 SURGERY — ARTERIOVENOUS (AV) FISTULA CREATION
Anesthesia: Choice | Laterality: Right

## 2022-06-21 DIAGNOSIS — Z992 Dependence on renal dialysis: Secondary | ICD-10-CM | POA: Diagnosis not present

## 2022-06-21 DIAGNOSIS — N2581 Secondary hyperparathyroidism of renal origin: Secondary | ICD-10-CM | POA: Diagnosis not present

## 2022-06-21 DIAGNOSIS — D689 Coagulation defect, unspecified: Secondary | ICD-10-CM | POA: Diagnosis not present

## 2022-06-21 DIAGNOSIS — D509 Iron deficiency anemia, unspecified: Secondary | ICD-10-CM | POA: Diagnosis not present

## 2022-06-21 DIAGNOSIS — N186 End stage renal disease: Secondary | ICD-10-CM | POA: Diagnosis not present

## 2022-06-26 DIAGNOSIS — Z992 Dependence on renal dialysis: Secondary | ICD-10-CM | POA: Diagnosis not present

## 2022-06-26 DIAGNOSIS — D631 Anemia in chronic kidney disease: Secondary | ICD-10-CM | POA: Diagnosis not present

## 2022-06-26 DIAGNOSIS — D509 Iron deficiency anemia, unspecified: Secondary | ICD-10-CM | POA: Diagnosis not present

## 2022-06-26 DIAGNOSIS — N2581 Secondary hyperparathyroidism of renal origin: Secondary | ICD-10-CM | POA: Diagnosis not present

## 2022-06-26 DIAGNOSIS — D689 Coagulation defect, unspecified: Secondary | ICD-10-CM | POA: Diagnosis not present

## 2022-06-26 DIAGNOSIS — N186 End stage renal disease: Secondary | ICD-10-CM | POA: Diagnosis not present

## 2022-06-28 DIAGNOSIS — Z992 Dependence on renal dialysis: Secondary | ICD-10-CM | POA: Diagnosis not present

## 2022-06-28 DIAGNOSIS — D689 Coagulation defect, unspecified: Secondary | ICD-10-CM | POA: Diagnosis not present

## 2022-06-28 DIAGNOSIS — D509 Iron deficiency anemia, unspecified: Secondary | ICD-10-CM | POA: Diagnosis not present

## 2022-06-28 DIAGNOSIS — D631 Anemia in chronic kidney disease: Secondary | ICD-10-CM | POA: Diagnosis not present

## 2022-06-28 DIAGNOSIS — N2581 Secondary hyperparathyroidism of renal origin: Secondary | ICD-10-CM | POA: Diagnosis not present

## 2022-06-28 DIAGNOSIS — N186 End stage renal disease: Secondary | ICD-10-CM | POA: Diagnosis not present

## 2022-07-03 ENCOUNTER — Other Ambulatory Visit: Payer: Self-pay

## 2022-07-03 ENCOUNTER — Encounter (HOSPITAL_COMMUNITY): Payer: Self-pay | Admitting: Vascular Surgery

## 2022-07-03 DIAGNOSIS — N186 End stage renal disease: Secondary | ICD-10-CM

## 2022-07-03 DIAGNOSIS — D509 Iron deficiency anemia, unspecified: Secondary | ICD-10-CM | POA: Diagnosis not present

## 2022-07-03 DIAGNOSIS — D689 Coagulation defect, unspecified: Secondary | ICD-10-CM | POA: Diagnosis not present

## 2022-07-03 DIAGNOSIS — N2581 Secondary hyperparathyroidism of renal origin: Secondary | ICD-10-CM | POA: Diagnosis not present

## 2022-07-03 DIAGNOSIS — Z992 Dependence on renal dialysis: Secondary | ICD-10-CM | POA: Diagnosis not present

## 2022-07-03 MED ORDER — VANCOMYCIN HCL 500 MG IV SOLR
INTRAVENOUS | Status: AC
  Filled 2022-07-03: qty 10

## 2022-07-03 NOTE — Progress Notes (Signed)
Mr. Shuck denies chest pain or shortness of breath. Patient denies having any s/s of Covid in his household, also denies any known exposure to Covid.   Mr. Cribb's PCP is Dr. Rick Duff. Patient was seen in the [facility] 03/2021, cardiologist is Dr. Aundra Dubin.  Mr. Tietje has type II diabetes. Patient checks CBG 3 times a day, it runs 140-160. I instructed patient to take 18 units of Lantus at bedtime tonight,I instructed patient to not take Glimipride this pm or in am. I instructed patient to check CBG after awaking and every 2 hours until arrival  to the hospital.  I Instructed patient if CBG is less than 70 to take 4 Glucose Tablets or 1 tube of Glucose Gel or 1/2 cup of a clear juice. Recheck CBG in 15 minutes if CBG is not over 70 call, pre- op desk at [phone] for further instructions.   Mr. Levene reports that he has chest pain , sometimes at dialysis, he takes Tylenol and it helps the pain; a Provider told Mr. Rybarczyk to try it.

## 2022-07-04 ENCOUNTER — Ambulatory Visit (HOSPITAL_COMMUNITY): Admission: RE | Admit: 2022-07-04 | Payer: Medicaid Other | Source: Home / Self Care | Admitting: Vascular Surgery

## 2022-07-04 ENCOUNTER — Encounter (HOSPITAL_COMMUNITY): Payer: Self-pay | Admitting: Anesthesiology

## 2022-07-04 SURGERY — ARTERIOVENOUS (AV) FISTULA CREATION
Anesthesia: Choice | Laterality: Right

## 2022-07-05 DIAGNOSIS — Z992 Dependence on renal dialysis: Secondary | ICD-10-CM | POA: Diagnosis not present

## 2022-07-05 DIAGNOSIS — N186 End stage renal disease: Secondary | ICD-10-CM | POA: Diagnosis not present

## 2022-07-05 DIAGNOSIS — N2581 Secondary hyperparathyroidism of renal origin: Secondary | ICD-10-CM | POA: Diagnosis not present

## 2022-07-05 DIAGNOSIS — D689 Coagulation defect, unspecified: Secondary | ICD-10-CM | POA: Diagnosis not present

## 2022-07-05 DIAGNOSIS — D509 Iron deficiency anemia, unspecified: Secondary | ICD-10-CM | POA: Diagnosis not present

## 2022-07-08 DIAGNOSIS — Z992 Dependence on renal dialysis: Secondary | ICD-10-CM | POA: Diagnosis not present

## 2022-07-08 DIAGNOSIS — E1129 Type 2 diabetes mellitus with other diabetic kidney complication: Secondary | ICD-10-CM | POA: Diagnosis not present

## 2022-07-08 DIAGNOSIS — N186 End stage renal disease: Secondary | ICD-10-CM | POA: Diagnosis not present

## 2022-07-09 ENCOUNTER — Other Ambulatory Visit (HOSPITAL_COMMUNITY): Payer: Self-pay

## 2022-07-09 ENCOUNTER — Encounter (HOSPITAL_COMMUNITY): Payer: Self-pay | Admitting: Emergency Medicine

## 2022-07-09 ENCOUNTER — Emergency Department (HOSPITAL_COMMUNITY): Payer: Medicaid Other

## 2022-07-09 ENCOUNTER — Emergency Department (HOSPITAL_COMMUNITY)
Admission: EM | Admit: 2022-07-09 | Discharge: 2022-07-09 | Disposition: A | Discharge status: Home / Self Care | Payer: Medicaid Other | Attending: Emergency Medicine | Admitting: Emergency Medicine

## 2022-07-09 ENCOUNTER — Encounter (HOSPITAL_COMMUNITY): Payer: Self-pay | Admitting: *Deleted

## 2022-07-09 DIAGNOSIS — E1122 Type 2 diabetes mellitus with diabetic chronic kidney disease: Secondary | ICD-10-CM | POA: Diagnosis not present

## 2022-07-09 DIAGNOSIS — I509 Heart failure, unspecified: Secondary | ICD-10-CM | POA: Diagnosis not present

## 2022-07-09 DIAGNOSIS — M5441 Lumbago with sciatica, right side: Secondary | ICD-10-CM | POA: Diagnosis not present

## 2022-07-09 DIAGNOSIS — Z79899 Other long term (current) drug therapy: Secondary | ICD-10-CM | POA: Diagnosis not present

## 2022-07-09 DIAGNOSIS — N186 End stage renal disease: Secondary | ICD-10-CM | POA: Diagnosis not present

## 2022-07-09 DIAGNOSIS — I358 Other nonrheumatic aortic valve disorders: Secondary | ICD-10-CM

## 2022-07-09 DIAGNOSIS — I132 Hypertensive heart and chronic kidney disease with heart failure and with stage 5 chronic kidney disease, or end stage renal disease: Secondary | ICD-10-CM | POA: Diagnosis not present

## 2022-07-09 DIAGNOSIS — Z7984 Long term (current) use of oral hypoglycemic drugs: Secondary | ICD-10-CM | POA: Insufficient documentation

## 2022-07-09 DIAGNOSIS — M545 Low back pain, unspecified: Secondary | ICD-10-CM | POA: Diagnosis not present

## 2022-07-09 DIAGNOSIS — Z794 Long term (current) use of insulin: Secondary | ICD-10-CM | POA: Diagnosis not present

## 2022-07-09 HISTORY — DX: Other nonrheumatic aortic valve disorders: I35.8

## 2022-07-09 MED ORDER — OXYCODONE-ACETAMINOPHEN 5-325 MG PO TABS
1.0000 | ORAL_TABLET | Freq: Once | ORAL | Status: AC
  Administered 2022-07-09: 1 via ORAL
  Filled 2022-07-09: qty 1

## 2022-07-09 MED ORDER — LIDOCAINE 5 % EX PTCH
1.0000 | MEDICATED_PATCH | CUTANEOUS | Status: DC
  Administered 2022-07-09: 1 via TRANSDERMAL
  Filled 2022-07-09: qty 1

## 2022-07-09 MED ORDER — LIDOCAINE 5 % EX PTCH
1.0000 | MEDICATED_PATCH | CUTANEOUS | 0 refills | Status: DC
  Filled 2022-07-09 – 2022-07-10 (×2): qty 30, 30d supply, fill #0
  Filled 2022-07-10: qty 5, 5d supply, fill #0
  Filled 2022-07-12 (×2): qty 5, 5d supply, fill #1
  Filled 2022-07-25: qty 5, 5d supply, fill #2
  Filled 2022-09-04: qty 5, 5d supply, fill #3

## 2022-07-09 MED ORDER — METHOCARBAMOL 500 MG PO TABS
500.0000 mg | ORAL_TABLET | Freq: Two times a day (BID) | ORAL | 0 refills | Status: DC
  Filled 2022-07-09: qty 26, 13d supply, fill #0

## 2022-07-09 NOTE — ED Provider Triage Note (Signed)
Emergency Medicine Provider Triage Evaluation Note  Jeremy [name] , a 41 y.o. male  was evaluated in triage.  Pt complains of low back pain.  Patient states his symptoms been present for the past 5 to 7 days.  He notes symptom onset after getting into the car over felt like a "pulling sensation."  He states his symptoms gotten worse since initial onset.  He has not been able to sit through full dialysis session yesterday.  Pain radiates down the right leg.  Has been taking at home Tylenol, Motrin and Aleve with minimal to no relief of symptoms.  Denies weakness/sensory deficits in lower extremities, bowel/bladder dysfunction, saddle anesthesia, fever, known trauma.  Review of Systems  Positive: See above Negative:   Physical Exam  BP (!) 169/81 (BP Location: Left Arm)   Pulse 65   Temp 98.6 F (37 C) (Oral)   Resp 18   SpO2 100%  Gen:   Awake, no distress   Resp:  Normal effort  MSK:   Moves extremities without difficulty  Other:   Tenderness in the lumbar region.  Right paraspinal tenderness in the lumbar area.  Medical Decision Making  Medically screening exam initiated at 11:21 AM.  Appropriate orders placed.  Jeremy [name] was informed that the remainder of the evaluation will be completed by another provider, this initial triage assessment does not replace that evaluation, and the importance of remaining in the ED until their evaluation is complete.     Wilnette Kales, [place] 07/09/22 1122

## 2022-07-09 NOTE — ED Provider Notes (Signed)
[facility] EMERGENCY DEPARTMENT Provider Note   CSN: [id number] Arrival date & time: 07/09/22  1100     History  Chief Complaint  Patient presents with   Back Pain    Jeremy [name] is a 41 y.o. male with past medical history significant for diabetes, obesity, hypertension, hyperlipidemia, CHF, ESRD on dialysis who presents with concern for right lower back pain radiating to right leg.  Patient reports the pain started 2 to 3 weeks ago but was worsened over the last day after sitting in his dialysis chair for 4 hours yesterday.  Patient reports he is using Advil, Tylenol without significant improvement.  He has not been diagnosed with sciatica previously, he has not seen orthopedic doctor before.  He denies any IV drug use, chronic corticosteroid use, fever, previous cancer.  He denies any groin numbness, tingling, difficulty with urination or defecation.   Back Pain      Home Medications Prior to Admission medications   Medication Sig Start Date End Date Taking? Authorizing Provider  lidocaine (LIDODERM) 5 % Place 1 patch onto the skin daily. Remove & Discard patch within 12 hours or as directed by MD 07/09/22  Yes Mckinzee Spirito H, PA-C  methocarbamol (ROBAXIN) 500 MG tablet Take 1 tablet (500 mg total) by mouth 2 (two) times daily. 07/09/22  Yes Samaa Ueda H, PA-C  Accu-Chek Softclix Lancets lancets Use as directed to check blood sugar four times daily. 02/24/22   Rick Duff, MD  amLODipine (NORVASC) 10 MG tablet Take 1 tablet (10 mg total) by mouth daily. Patient not taking: Reported on 01/10/2022 10/15/20 11/27/21  Cato Mulligan, MD  amLODipine (NORVASC) 10 MG tablet Take 1 tablet (10 mg total) by mouth daily. 01/10/22   Axel Filler, MD  atorvastatin (LIPITOR) 80 MG tablet Take 1 tablet (80 mg total) by mouth daily. NEEDS FOLLOW UP APPOINTMENT FOR ANYMORE REFILLS 05/08/22   Bensimhon, Shaune Pascal, MD  AURYXIA 1 GM 210 MG(Fe) tablet Take  420 mg by mouth 3 (three) times daily. 08/05/21   [provider]  blood glucose meter kit and supplies KIT Dispense based on patient and insurance preference. Use up to four times daily as directed. (FOR ICD-9 250.00, 250.01). 07/21/18   Katherine Roan, MD  Blood Glucose Monitoring Suppl (ACCU-CHEK GUIDE) w/Device KIT Use to test blood sugar 02/24/22   Rick Duff, MD  Blood Pressure Monitoring (FORA P20 BLOOD PRESSURE CUFF) MISC Check your blood pressure everyday in the morning. 02/24/22   Rick Duff, MD  calcitRIOL (ROCALTROL) 0.25 MCG capsule Take 1 capsule (0.25 mcg total) by mouth daily. 05/02/21     carvedilol (COREG) 25 MG tablet Take 1.5 tablets (37.5 mg total) by mouth 2 (two) times daily with a meal. Need to schedule an appointment for further refills 05/05/22   [name], MD  cephALEXin (KEFLEX) 500 MG capsule Take 1 capsule (500 mg total) by mouth 2 (two) times daily. 05/14/21     cloNIDine (CATAPRES) 0.3 MG tablet Take 1 tablet (0.3 mg total) by mouth 2 (two) times daily. 04/10/21   [name], MD  cloNIDine (CATAPRES) 0.3 MG tablet Take 1 tablet (0.3 mg total) by mouth 2 (two) times daily as directed 08/21/21     ferric citrate (AURYXIA) 1 GM 210 MG(Fe) tablet Take 2 tablets (420 mg total) by mouth as directed with meals & 2 with snacks 02/06/22     ferric citrate (AURYXIA) 1 GM 210 MG(Fe) tablet Take 2  tablets (420 mg total) by mouth 3 (three) times daily with meals, AND 2 tablets (420 mg total) 2 (two) times daily with snacks 03/19/22     glipiZIDE (GLUCOTROL) 5 MG tablet Take 1 tablet (5 mg total) by mouth daily before breakfast. 03/07/22   Lacinda Axon, MD  glucose blood (ACCU-CHEK GUIDE) test strip USE TO CHECK BLOOD SUGAR 4 TIMES DAILY 02/24/22   Rick Duff, MD  hydrALAZINE (APRESOLINE) 100 MG tablet Take 1 tablet (100 mg total) by mouth 3 (three) times daily. 01/31/22   Bensimhon, Shaune Pascal, MD  insulin aspart (NOVOLOG FLEXPEN) 100 UNIT/ML  FlexPen Inject 15 Units into the skin 3 (three) times daily with meals. Patient taking differently: Inject 17 Units into the skin 3 (three) times daily with meals. 03/25/22 06/23/22  Masters, Katie, DO  insulin glargine (LANTUS) 100 unit/mL SOPN Inject 37 Units into the skin at bedtime. 10/07/21 01/11/22  Gaylan Gerold, DO  Insulin Pen Needle (PEN NEEDLES) 31G X 5 MM MISC use four times daily with insulin 10/07/21 01/05/22  Gaylan Gerold, DO  Insulin Syringe-Needle U-100 (ULTICARE INSULIN SYRINGE) 30G X 1/2" 0.5 ML MISC USE 4 TIMES DAILY WITH INSULIN INJECTIONS. 10/07/21 10/07/22  Iona Beard, MD  isosorbide mononitrate (IMDUR) 30 MG 24 hr tablet Take 1 tablet (30 mg total) by mouth daily. MUST HAVE FOLLOW UP APPOINTMENT FOR FURTHER REFILLS 01/10/22   [name], MD  losartan (COZAAR) 50 MG tablet Take 1 tablet (50 mg total) by mouth every evening. 07/30/21     multivitamin (RENA-VIT) TABS tablet Take 1 tablet by mouth daily.    [provider]  nitroGLYCERIN (NITROSTAT) 0.4 MG SL tablet PLACE 1 TABLET UNDER THE TONGUE EVERY 5 MINUTES AS NEEDED FOR CHEST PAIN. 06/28/19   [name], MD  ondansetron (ZOFRAN-ODT) 4 MG disintegrating tablet Take 1 tablet (4 mg total) by mouth every 8 (eight) hours as needed for nausea or vomiting. 07/02/21   Smoot, Leary Roca, PA-C  oxyCODONE-acetaminophen (PERCOCET/ROXICET) 5-325 MG tablet Take 1 tablet by mouth every 6 (six) hours as needed for severe pain. 07/02/21   Smoot, Leary Roca, PA-C  RENVELA 800 MG tablet Take 1,600 mg by mouth 3 (three) times daily with meals. 07/28/21   [provider]  sodium bicarbonate 650 MG tablet Take 2 tablets (1,300 mg total) by mouth 3 (three) times daily. 02/08/21         Allergies    Acyclovir and related    Review of Systems   Review of Systems  Musculoskeletal:  Positive for back pain.  All other systems reviewed and are negative.   Physical Exam Updated Vital Signs BP (!) 169/81 (BP Location: Left Arm)    Pulse 65   Temp 98.6 F (37 C) (Oral)   Resp 18   SpO2 100%  Physical Exam Vitals and nursing note reviewed.  Constitutional:      General: He is not in acute distress.    Appearance: Normal appearance.  HENT:     Head: Normocephalic and atraumatic.  Eyes:     General:        Right eye: No discharge.        Left eye: No discharge.  Cardiovascular:     Rate and Rhythm: Normal rate and regular rhythm.  Pulmonary:     Effort: Pulmonary effort is normal. No respiratory distress.  Musculoskeletal:        General: No deformity.     Comments: Intact strength 5/5 of  bilateral lower extremities, positive straight leg raise on right, some decreased range of motion actively secondary to pain but intact passive range of motion.  Patient with some tenderness right lumbar paraspinous muscles with no midline spinal tenderness, no tenderness over the right trochanter.  Skin:    General: Skin is warm and dry.  Neurological:     Mental Status: He is alert and oriented to person, place, and time.  Psychiatric:        Mood and Affect: Mood normal.        Behavior: Behavior normal.     ED Results / Procedures / Treatments   Labs (all labs ordered are listed, but only abnormal results are displayed) Labs Reviewed - No data to display  EKG None  Radiology DG Lumbar Spine Complete  Result Date: 07/09/2022 CLINICAL DATA:  Right-sided shooting leg pain EXAM: LUMBAR SPINE - COMPLETE 4+ VIEW COMPARISON:  None Available. FINDINGS: Straightening of the normal lumbar lordosis. Mild disc space loss in the lower lumbar spine with anterior osteophytes at L4 and L5. Vertebral body heights are preserved. There is mild-to-moderate lower lumbar spine predominant facet degenerative change. Moderate colonic stool burden. No dilated loops of small bowel are visualized. IMPRESSION: Mild lower lumbar spine predominant degenerative change. Electronically Signed   By: Marin Roberts M.D.   On: 07/09/2022 11:44     Procedures Procedures    Medications Ordered in ED Medications  oxyCODONE-acetaminophen (PERCOCET/ROXICET) 5-325 MG per tablet 1 tablet (has no administration in time range)  lidocaine (LIDODERM) 5 % 1 patch (has no administration in time range)    ED Course/ Medical Decision Making/ A&P                           Medical Decision Making  Patient with back pain.  My emergent differential diagnosis includes slipped disc, compression fracture, spondylolisthesis, less clinical concern for epidural abscess or osteomyelitis based on patient history.  No neurological deficits. Patient is ambulatory. No warning symptoms of back pain including: fecal incontinence, urinary retention or overflow incontinence, night sweats, waking from sleep with back pain, unexplained fevers or weight loss, h/o cancer, IVDU, recent trauma. No concern for cauda equina, epidural abscess, or other serious cause of back pain. Conservative measures such as rest, ice/heat, ibuprofen, Tylenol, and  prescription for Robaxin, lidocaine patches, indicated with orthopedic follow-up if no improvement with conservative management.  Extensive return precautions given, patient discharged in stable condition at this time.  I independently interpreted imaging including plain film lumbar spine which shows degenerative changes, no evidence of new compression fracture. I agree with the radiologist interpretation.  Final Clinical Impression(s) / ED Diagnoses Final diagnoses:  Acute right-sided low back pain with right-sided sciatica    Rx / DC Orders ED Discharge Orders          Ordered    lidocaine (LIDODERM) 5 %  Every 24 hours        07/09/22 1240    methocarbamol (ROBAXIN) 500 MG tablet  2 times daily        07/09/22 1240              Aribella Vavra, [place] H, PA-C 07/09/22 1241    Kemper Durie, DO 07/09/22 1700

## 2022-07-09 NOTE — Discharge Instructions (Addendum)
Please use Tylenol for pain.  You may use 1000 mg of Tylenol every 6 hours.  Not to exceed 4 g of Tylenol within 24 hours.  You can use the muscle relaxant I am prescribing in addition to the above to help with any breakthrough pain.  You can take it up to twice daily.  It is safe to take at night, but I would be cautious taking it during the day as it can cause some drowsiness.  Make sure that you are feeling awake and alert before you get behind the wheel of a car or operate a motor vehicle.  It is not a narcotic pain medication so you are able to take it if it is not making you drowsy and still pilot a vehicle or machinery safely.  Please use the rehab exercises I have provided above and follow up with ortho at your earliest convenience.

## 2022-07-09 NOTE — ED Triage Notes (Signed)
Patient here with compaint of right lower back pain that radiates into his right leg. Pain started two or three weeks ago but got worse yesterday while sitting in chair for dialysis. Patient denies recent injury, denies new urinary and stool incontinence. Patient is alert, oriented, and in no apparent distress at this time.

## 2022-07-10 ENCOUNTER — Encounter (HOSPITAL_COMMUNITY): Payer: Self-pay | Admitting: *Deleted

## 2022-07-10 ENCOUNTER — Other Ambulatory Visit (HOSPITAL_COMMUNITY): Payer: Self-pay

## 2022-07-11 ENCOUNTER — Other Ambulatory Visit (HOSPITAL_COMMUNITY): Payer: Self-pay

## 2022-07-12 ENCOUNTER — Encounter (HOSPITAL_COMMUNITY): Payer: Self-pay | Admitting: *Deleted

## 2022-07-12 ENCOUNTER — Other Ambulatory Visit: Payer: Self-pay

## 2022-07-12 ENCOUNTER — Other Ambulatory Visit (HOSPITAL_COMMUNITY): Payer: Self-pay

## 2022-07-14 ENCOUNTER — Other Ambulatory Visit (HOSPITAL_COMMUNITY): Payer: Self-pay

## 2022-07-14 ENCOUNTER — Telehealth: Payer: Self-pay

## 2022-07-14 NOTE — Telephone Encounter (Signed)
Pt family member called requesting his medicine that was sent in from his urgent care  visit to be filled or to get a different kind .. I explained that pt needs to have a f/u inorder for the meds to be filled  since he was seen 11/3 she stated that he is vomiting I offered the first appt with have  with PCP  she then got rude saying that  she knows I dont have the ability but what would I do .[name] I replied again the way things go is if pt was seen in the ED or [place]  .[name] The doctor or nurse would have told you to call your PCP office make an f/u visit to be checked out  in a few days or weeks  to ensure the meds are working  or and in his case since he is vomiting  he needs to have an appt  and that is the office rules and the RN would tell you the same  as the [place] that I am  or even to go back to the [facility]  or ED  because we are book until the date I gave her 11/14 with his PCP .[name] She then said sorry and she will call the patient to if out  if he wants the appt  or not

## 2022-07-14 NOTE — Telephone Encounter (Signed)
RTC to [facility] unable to leave message as line was full.

## 2022-07-15 ENCOUNTER — Emergency Department (HOSPITAL_COMMUNITY): Payer: Medicaid Other

## 2022-07-15 ENCOUNTER — Inpatient Hospital Stay (HOSPITAL_COMMUNITY)
Admission: EM | Admit: 2022-07-15 | Discharge: 2022-07-22 | DRG: 314 | Disposition: A | Discharge status: Home / Self Care | Payer: Medicaid Other | Attending: Internal Medicine | Admitting: Internal Medicine

## 2022-07-15 ENCOUNTER — Encounter (HOSPITAL_COMMUNITY): Payer: Self-pay

## 2022-07-15 ENCOUNTER — Other Ambulatory Visit: Payer: Self-pay

## 2022-07-15 ENCOUNTER — Encounter: Payer: Medicaid Other | Admitting: Student

## 2022-07-15 DIAGNOSIS — N2581 Secondary hyperparathyroidism of renal origin: Secondary | ICD-10-CM | POA: Diagnosis not present

## 2022-07-15 DIAGNOSIS — E785 Hyperlipidemia, unspecified: Secondary | ICD-10-CM | POA: Diagnosis present

## 2022-07-15 DIAGNOSIS — R531 Weakness: Secondary | ICD-10-CM

## 2022-07-15 DIAGNOSIS — Z01810 Encounter for preprocedural cardiovascular examination: Secondary | ICD-10-CM | POA: Diagnosis not present

## 2022-07-15 DIAGNOSIS — Z794 Long term (current) use of insulin: Secondary | ICD-10-CM | POA: Diagnosis not present

## 2022-07-15 DIAGNOSIS — A498 Other bacterial infections of unspecified site: Secondary | ICD-10-CM

## 2022-07-15 DIAGNOSIS — M48061 Spinal stenosis, lumbar region without neurogenic claudication: Secondary | ICD-10-CM | POA: Diagnosis not present

## 2022-07-15 DIAGNOSIS — I132 Hypertensive heart and chronic kidney disease with heart failure and with stage 5 chronic kidney disease, or end stage renal disease: Secondary | ICD-10-CM | POA: Diagnosis not present

## 2022-07-15 DIAGNOSIS — Z0389 Encounter for observation for other suspected diseases and conditions ruled out: Secondary | ICD-10-CM | POA: Diagnosis not present

## 2022-07-15 DIAGNOSIS — I350 Nonrheumatic aortic (valve) stenosis: Secondary | ICD-10-CM | POA: Diagnosis not present

## 2022-07-15 DIAGNOSIS — E278 Other specified disorders of adrenal gland: Secondary | ICD-10-CM | POA: Diagnosis present

## 2022-07-15 DIAGNOSIS — I5042 Chronic combined systolic (congestive) and diastolic (congestive) heart failure: Secondary | ICD-10-CM | POA: Diagnosis not present

## 2022-07-15 DIAGNOSIS — N186 End stage renal disease: Secondary | ICD-10-CM

## 2022-07-15 DIAGNOSIS — I5020 Unspecified systolic (congestive) heart failure: Secondary | ICD-10-CM

## 2022-07-15 DIAGNOSIS — I4891 Unspecified atrial fibrillation: Secondary | ICD-10-CM | POA: Diagnosis present

## 2022-07-15 DIAGNOSIS — Y848 Other medical procedures as the cause of abnormal reaction of the patient, or of later complication, without mention of misadventure at the time of the procedure: Secondary | ICD-10-CM | POA: Diagnosis present

## 2022-07-15 DIAGNOSIS — I82210 Acute embolism and thrombosis of superior vena cava: Secondary | ICD-10-CM | POA: Diagnosis present

## 2022-07-15 DIAGNOSIS — T80211A Bloodstream infection due to central venous catheter, initial encounter: Principal | ICD-10-CM

## 2022-07-15 DIAGNOSIS — Z7984 Long term (current) use of oral hypoglycemic drugs: Secondary | ICD-10-CM

## 2022-07-15 DIAGNOSIS — Z992 Dependence on renal dialysis: Secondary | ICD-10-CM | POA: Diagnosis not present

## 2022-07-15 DIAGNOSIS — M5416 Radiculopathy, lumbar region: Secondary | ICD-10-CM | POA: Diagnosis present

## 2022-07-15 DIAGNOSIS — E1151 Type 2 diabetes mellitus with diabetic peripheral angiopathy without gangrene: Secondary | ICD-10-CM | POA: Diagnosis present

## 2022-07-15 DIAGNOSIS — Z5181 Encounter for therapeutic drug level monitoring: Secondary | ICD-10-CM

## 2022-07-15 DIAGNOSIS — D631 Anemia in chronic kidney disease: Secondary | ICD-10-CM | POA: Diagnosis present

## 2022-07-15 DIAGNOSIS — I330 Acute and subacute infective endocarditis: Secondary | ICD-10-CM | POA: Diagnosis not present

## 2022-07-15 DIAGNOSIS — A419 Sepsis, unspecified organism: Secondary | ICD-10-CM

## 2022-07-15 DIAGNOSIS — Z01818 Encounter for other preprocedural examination: Secondary | ICD-10-CM

## 2022-07-15 DIAGNOSIS — Z952 Presence of prosthetic heart valve: Secondary | ICD-10-CM | POA: Diagnosis not present

## 2022-07-15 DIAGNOSIS — I70208 Unspecified atherosclerosis of native arteries of extremities, other extremity: Secondary | ICD-10-CM | POA: Diagnosis present

## 2022-07-15 DIAGNOSIS — R7881 Bacteremia: Secondary | ICD-10-CM | POA: Diagnosis not present

## 2022-07-15 DIAGNOSIS — I358 Other nonrheumatic aortic valve disorders: Secondary | ICD-10-CM | POA: Diagnosis not present

## 2022-07-15 DIAGNOSIS — M5441 Lumbago with sciatica, right side: Secondary | ICD-10-CM | POA: Diagnosis not present

## 2022-07-15 DIAGNOSIS — Z8249 Family history of ischemic heart disease and other diseases of the circulatory system: Secondary | ICD-10-CM

## 2022-07-15 DIAGNOSIS — Z888 Allergy status to other drugs, medicaments and biological substances status: Secondary | ICD-10-CM

## 2022-07-15 DIAGNOSIS — F419 Anxiety disorder, unspecified: Secondary | ICD-10-CM | POA: Diagnosis present

## 2022-07-15 DIAGNOSIS — Z634 Disappearance and death of family member: Secondary | ICD-10-CM

## 2022-07-15 DIAGNOSIS — Z79899 Other long term (current) drug therapy: Secondary | ICD-10-CM

## 2022-07-15 DIAGNOSIS — R5081 Fever presenting with conditions classified elsewhere: Secondary | ICD-10-CM | POA: Diagnosis not present

## 2022-07-15 DIAGNOSIS — R509 Fever, unspecified: Secondary | ICD-10-CM | POA: Diagnosis not present

## 2022-07-15 DIAGNOSIS — B952 Enterococcus as the cause of diseases classified elsewhere: Secondary | ICD-10-CM

## 2022-07-15 DIAGNOSIS — R7989 Other specified abnormal findings of blood chemistry: Secondary | ICD-10-CM

## 2022-07-15 DIAGNOSIS — E669 Obesity, unspecified: Secondary | ICD-10-CM | POA: Diagnosis present

## 2022-07-15 DIAGNOSIS — I428 Other cardiomyopathies: Secondary | ICD-10-CM | POA: Diagnosis not present

## 2022-07-15 DIAGNOSIS — K219 Gastro-esophageal reflux disease without esophagitis: Secondary | ICD-10-CM | POA: Diagnosis present

## 2022-07-15 DIAGNOSIS — J189 Pneumonia, unspecified organism: Secondary | ICD-10-CM | POA: Diagnosis not present

## 2022-07-15 DIAGNOSIS — E1169 Type 2 diabetes mellitus with other specified complication: Secondary | ICD-10-CM | POA: Diagnosis present

## 2022-07-15 DIAGNOSIS — E1122 Type 2 diabetes mellitus with diabetic chronic kidney disease: Secondary | ICD-10-CM | POA: Diagnosis not present

## 2022-07-15 DIAGNOSIS — I351 Nonrheumatic aortic (valve) insufficiency: Secondary | ICD-10-CM | POA: Insufficient documentation

## 2022-07-15 DIAGNOSIS — T80211D Bloodstream infection due to central venous catheter, subsequent encounter: Secondary | ICD-10-CM

## 2022-07-15 DIAGNOSIS — K045 Chronic apical periodontitis: Secondary | ICD-10-CM

## 2022-07-15 DIAGNOSIS — N281 Cyst of kidney, acquired: Secondary | ICD-10-CM | POA: Diagnosis not present

## 2022-07-15 DIAGNOSIS — I080 Rheumatic disorders of both mitral and aortic valves: Secondary | ICD-10-CM | POA: Diagnosis not present

## 2022-07-15 DIAGNOSIS — M5134 Other intervertebral disc degeneration, thoracic region: Secondary | ICD-10-CM | POA: Diagnosis not present

## 2022-07-15 DIAGNOSIS — Z6834 Body mass index (BMI) 34.0-34.9, adult: Secondary | ICD-10-CM

## 2022-07-15 DIAGNOSIS — Z833 Family history of diabetes mellitus: Secondary | ICD-10-CM

## 2022-07-15 DIAGNOSIS — K8020 Calculus of gallbladder without cholecystitis without obstruction: Secondary | ICD-10-CM | POA: Diagnosis not present

## 2022-07-15 DIAGNOSIS — Z8616 Personal history of COVID-19: Secondary | ICD-10-CM

## 2022-07-15 DIAGNOSIS — I509 Heart failure, unspecified: Secondary | ICD-10-CM | POA: Diagnosis not present

## 2022-07-15 DIAGNOSIS — M898X9 Other specified disorders of bone, unspecified site: Secondary | ICD-10-CM | POA: Diagnosis present

## 2022-07-15 DIAGNOSIS — Z6831 Body mass index (BMI) 31.0-31.9, adult: Secondary | ICD-10-CM

## 2022-07-15 DIAGNOSIS — I339 Acute and subacute endocarditis, unspecified: Secondary | ICD-10-CM | POA: Diagnosis not present

## 2022-07-15 DIAGNOSIS — E1165 Type 2 diabetes mellitus with hyperglycemia: Secondary | ICD-10-CM | POA: Diagnosis present

## 2022-07-15 DIAGNOSIS — I10 Essential (primary) hypertension: Secondary | ICD-10-CM | POA: Diagnosis not present

## 2022-07-15 DIAGNOSIS — Z20822 Contact with and (suspected) exposure to covid-19: Secondary | ICD-10-CM | POA: Diagnosis present

## 2022-07-15 DIAGNOSIS — M47816 Spondylosis without myelopathy or radiculopathy, lumbar region: Secondary | ICD-10-CM | POA: Diagnosis not present

## 2022-07-15 DIAGNOSIS — R739 Hyperglycemia, unspecified: Secondary | ICD-10-CM | POA: Diagnosis not present

## 2022-07-15 DIAGNOSIS — M5126 Other intervertebral disc displacement, lumbar region: Secondary | ICD-10-CM | POA: Diagnosis not present

## 2022-07-15 DIAGNOSIS — K029 Dental caries, unspecified: Secondary | ICD-10-CM

## 2022-07-15 DIAGNOSIS — K056 Periodontal disease, unspecified: Secondary | ICD-10-CM

## 2022-07-15 DIAGNOSIS — K0889 Other specified disorders of teeth and supporting structures: Secondary | ICD-10-CM

## 2022-07-15 DIAGNOSIS — I38 Endocarditis, valve unspecified: Secondary | ICD-10-CM | POA: Diagnosis not present

## 2022-07-15 DIAGNOSIS — R918 Other nonspecific abnormal finding of lung field: Secondary | ICD-10-CM | POA: Diagnosis not present

## 2022-07-15 DIAGNOSIS — I120 Hypertensive chronic kidney disease with stage 5 chronic kidney disease or end stage renal disease: Secondary | ICD-10-CM | POA: Diagnosis not present

## 2022-07-15 LAB — CBC WITH DIFFERENTIAL/PLATELET
Abs Immature Granulocytes: 0.11 10*3/uL — ABNORMAL HIGH (ref 0.00–0.07)
Basophils Absolute: 0.1 10*3/uL (ref 0.0–0.1)
Basophils Relative: 0 %
Eosinophils Absolute: 0.1 10*3/uL (ref 0.0–0.5)
Eosinophils Relative: 0 %
HCT: 34 % — ABNORMAL LOW (ref 39.0–52.0)
Hemoglobin: 10.6 g/dL — ABNORMAL LOW (ref 13.0–17.0)
Immature Granulocytes: 1 %
Lymphocytes Relative: 4 %
Lymphs Abs: 0.6 10*3/uL — ABNORMAL LOW (ref 0.7–4.0)
MCH: 26.9 pg (ref 26.0–34.0)
MCHC: 31.2 g/dL (ref 30.0–36.0)
MCV: 86.3 fL (ref 80.0–100.0)
Monocytes Absolute: 0.6 10*3/uL (ref 0.1–1.0)
Monocytes Relative: 3 %
Neutro Abs: 15.9 10*3/uL — ABNORMAL HIGH (ref 1.7–7.7)
Neutrophils Relative %: 92 %
Platelets: 359 10*3/uL (ref 150–400)
RBC: 3.94 MIL/uL — ABNORMAL LOW (ref 4.22–5.81)
RDW: 15.8 % — ABNORMAL HIGH (ref 11.5–15.5)
WBC: 17.3 10*3/uL — ABNORMAL HIGH (ref 4.0–10.5)
nRBC: 0 % (ref 0.0–0.2)

## 2022-07-15 LAB — URINALYSIS, ROUTINE W REFLEX MICROSCOPIC
Bilirubin Urine: NEGATIVE
Glucose, UA: 150 mg/dL — AB
Hgb urine dipstick: NEGATIVE
Ketones, ur: NEGATIVE mg/dL
Leukocytes,Ua: NEGATIVE
Nitrite: NEGATIVE
Protein, ur: 100 mg/dL — AB
Specific Gravity, Urine: 1.019 (ref 1.005–1.030)
pH: 7 (ref 5.0–8.0)

## 2022-07-15 LAB — RESP PANEL BY RT-PCR (FLU A&B, COVID) ARPGX2
Influenza A by PCR: NEGATIVE
Influenza B by PCR: NEGATIVE
SARS Coronavirus 2 by RT PCR: NEGATIVE

## 2022-07-15 LAB — COMPREHENSIVE METABOLIC PANEL
ALT: 26 U/L (ref 0–44)
AST: 25 U/L (ref 15–41)
Albumin: 2.8 g/dL — ABNORMAL LOW (ref 3.5–5.0)
Alkaline Phosphatase: 55 U/L (ref 38–126)
Anion gap: 16 — ABNORMAL HIGH (ref 5–15)
BUN: 60 mg/dL — ABNORMAL HIGH (ref 6–20)
CO2: 19 mmol/L — ABNORMAL LOW (ref 22–32)
Calcium: 9.1 mg/dL (ref 8.9–10.3)
Chloride: 103 mmol/L (ref 98–111)
Creatinine, Ser: 9.1 mg/dL — ABNORMAL HIGH (ref 0.61–1.24)
GFR, Estimated: 7 mL/min — ABNORMAL LOW (ref >60)
Glucose, Bld: 240 mg/dL — ABNORMAL HIGH (ref 70–99)
Potassium: 4.5 mmol/L (ref 3.5–5.1)
Sodium: 138 mmol/L (ref 135–145)
Total Bilirubin: 0.8 mg/dL (ref 0.3–1.2)
Total Protein: 7.7 g/dL (ref 6.5–8.1)

## 2022-07-15 LAB — APTT: aPTT: 31 seconds (ref 24–36)

## 2022-07-15 LAB — PROTIME-INR
INR: 1.2 (ref 0.8–1.2)
Prothrombin Time: 15.3 seconds — ABNORMAL HIGH (ref 11.4–15.2)

## 2022-07-15 LAB — LACTIC ACID, PLASMA: Lactic Acid, Venous: 1.7 mmol/L (ref 0.5–1.9)

## 2022-07-15 LAB — TROPONIN I (HIGH SENSITIVITY)
Troponin I (High Sensitivity): 256 ng/L (ref <18)
Troponin I (High Sensitivity): 213 ng/L (ref <18)

## 2022-07-15 MED ORDER — AMLODIPINE BESYLATE 5 MG PO TABS: 10.0000 mg | ORAL_TABLET | Freq: Every day | ORAL | Status: DC

## 2022-07-15 MED ORDER — VANCOMYCIN HCL IN DEXTROSE 1-5 GM/200ML-% IV SOLN: 1000.0000 mg | INTRAVENOUS | Status: DC

## 2022-07-15 MED ORDER — ACETAMINOPHEN 500 MG PO TABS
1000.0000 mg | ORAL_TABLET | Freq: Once | ORAL | Status: AC
  Administered 2022-07-15: 1000 mg via ORAL
  Filled 2022-07-15: qty 2

## 2022-07-15 MED ORDER — ONDANSETRON HCL 4 MG/2ML IJ SOLN
4.0000 mg | Freq: Once | INTRAMUSCULAR | Status: AC
  Administered 2022-07-15: 4 mg via INTRAVENOUS
  Filled 2022-07-15: qty 2

## 2022-07-15 MED ORDER — HEPARIN SODIUM (PORCINE) 5000 UNIT/ML IJ SOLN
5000.0000 [IU] | Freq: Three times a day (TID) | INTRAMUSCULAR | Status: DC
  Administered 2022-07-16 – 2022-07-22 (×17): 5000 [IU] via SUBCUTANEOUS
  Filled 2022-07-15 (×17): qty 1

## 2022-07-15 MED ORDER — GADOBUTROL 1 MMOL/ML IV SOLN
10.0000 mL | Freq: Once | INTRAVENOUS | Status: AC | PRN
  Administered 2022-07-15: 10 mL via INTRAVENOUS

## 2022-07-15 MED ORDER — SODIUM CHLORIDE 0.9 % IV SOLN
2.0000 g | Freq: Once | INTRAVENOUS | Status: AC
  Administered 2022-07-15: 2 g via INTRAVENOUS
  Filled 2022-07-15: qty 12.5

## 2022-07-15 MED ORDER — SEVELAMER CARBONATE 800 MG PO TABS
1600.0000 mg | ORAL_TABLET | Freq: Three times a day (TID) | ORAL | Status: DC
  Administered 2022-07-17 – 2022-07-22 (×8): 1600 mg via ORAL
  Filled 2022-07-15 (×8): qty 2

## 2022-07-15 MED ORDER — SODIUM CHLORIDE 0.9 % IV SOLN: 2.0000 g | Freq: Once | INTRAVENOUS | Status: DC

## 2022-07-15 MED ORDER — CLONIDINE HCL 0.1 MG PO TABS
0.3000 mg | ORAL_TABLET | Freq: Two times a day (BID) | ORAL | Status: DC
  Administered 2022-07-15 – 2022-07-22 (×13): 0.3 mg via ORAL
  Filled 2022-07-15 (×13): qty 3

## 2022-07-15 MED ORDER — METRONIDAZOLE 500 MG/100ML IV SOLN
500.0000 mg | Freq: Once | INTRAVENOUS | Status: AC
  Administered 2022-07-15: 500 mg via INTRAVENOUS
  Filled 2022-07-15: qty 100

## 2022-07-15 MED ORDER — VANCOMYCIN HCL IN DEXTROSE 1-5 GM/200ML-% IV SOLN
1000.0000 mg | Freq: Once | INTRAVENOUS | Status: DC
  Filled 2022-07-15: qty 200

## 2022-07-15 MED ORDER — SODIUM CHLORIDE 0.9 % IV SOLN
1.0000 g | INTRAVENOUS | Status: DC
  Filled 2022-07-15: qty 10

## 2022-07-15 MED ORDER — LACTATED RINGERS IV SOLN: INTRAVENOUS | Status: DC

## 2022-07-15 MED ORDER — VANCOMYCIN HCL 10 G IV SOLR
2500.0000 mg | Freq: Once | INTRAVENOUS | Status: AC
  Administered 2022-07-15: 2500 mg via INTRAVENOUS
  Filled 2022-07-15: qty 2500

## 2022-07-15 MED ORDER — MORPHINE SULFATE (PF) 4 MG/ML IV SOLN
4.0000 mg | Freq: Once | INTRAVENOUS | Status: AC
  Administered 2022-07-15: 4 mg via INTRAVENOUS
  Filled 2022-07-15: qty 1

## 2022-07-15 MED ORDER — INSULIN GLARGINE-YFGN 100 UNIT/ML ~~LOC~~ SOLN
25.0000 [IU] | Freq: Every day | SUBCUTANEOUS | Status: DC
  Administered 2022-07-16 – 2022-07-21 (×4): 25 [IU] via SUBCUTANEOUS
  Filled 2022-07-15 (×8): qty 0.25

## 2022-07-15 MED ORDER — CARVEDILOL 12.5 MG PO TABS
37.5000 mg | ORAL_TABLET | Freq: Two times a day (BID) | ORAL | Status: DC
  Administered 2022-07-15 – 2022-07-21 (×11): 37.5 mg via ORAL
  Filled 2022-07-15 (×12): qty 3

## 2022-07-15 MED ORDER — HYDRALAZINE HCL 50 MG PO TABS
100.0000 mg | ORAL_TABLET | Freq: Three times a day (TID) | ORAL | Status: DC
  Administered 2022-07-15: 100 mg via ORAL
  Filled 2022-07-15: qty 2

## 2022-07-15 MED ORDER — INSULIN ASPART 100 UNIT/ML IJ SOLN
12.0000 [IU] | Freq: Three times a day (TID) | INTRAMUSCULAR | Status: DC
  Administered 2022-07-17 – 2022-07-22 (×8): 12 [IU] via SUBCUTANEOUS

## 2022-07-15 MED ORDER — IOHEXOL 350 MG/ML SOLN
150.0000 mL | Freq: Once | INTRAVENOUS | Status: AC | PRN
  Administered 2022-07-15: 150 mL via INTRAVENOUS

## 2022-07-15 NOTE — Sepsis Progress Note (Signed)
Sepsis protocol monitored by eLink 

## 2022-07-15 NOTE — Hospital Course (Addendum)
Principal Problem:   Fever Active Problems:   Primary hypertension   Enterococcal bacteremia   Catheter-related bloodstream infection   Enterococcus faecalis infection   Acute bacterial endocarditis   Nonrheumatic aortic valve insufficiency   Acute midline low back pain with right-sided sciatica   ESRD (end stage renal disease) (H[facility])   Aortic valve endocarditis   HFrEF (heart failure with reduced ejection fraction) ([place])  Resolved Problems:   * No resolved hospital problems. *  Discharge items: -Dental evaluation prior to aortic valve replacement -Coronary CTA -2 weeks antibiotics prior to valve surgery

## 2022-07-15 NOTE — ED Provider Triage Note (Signed)
Emergency Medicine Provider Triage Evaluation Note  Jeremy [name] , a 41 y.o. male  was evaluated in triage.  Pt complains of fever.  Patient is dialysis Tuesday, Thursdays, Saturday.  Had 30 minutes of dialysis today but had to stop due to chest pain, shaking and tachycardia.  Patient was found to be febrile at dialysis sent to ED for bacteremia work-up.  Patient endorses some chest pain shortness of breath which resolved after the dialysis stopped.  He has been having middle back pain but denies any dysuria hematuria.  Patient still makes urine..  Review of Systems  Per HPI  Physical Exam  BP (!) 156/87 (BP Location: Right Arm)   Pulse (!) 124   Temp (!) 101.1 F (38.4 C) (Oral)   Resp (!) 22   SpO2 97%  Gen:   Awake, no distress   Resp:  Normal effort  MSK:   Moves extremities without difficulty  Other:  Ill-appearing.  Tachycardic.  Tachypneic.  Medical Decision Making  Medically screening exam initiated at 1:06 PM.  Appropriate orders placed.  [name] was informed that the remainder of the evaluation will be completed by another provider, this initial triage assessment does not replace that evaluation, and the importance of remaining in the ED until their evaluation is complete.  Sepsis. Charge RN aware. Tylenol ordered    Sherrill Raring, PA-C 07/15/22 1307

## 2022-07-15 NOTE — ED Triage Notes (Signed)
Patient thinks he has infection in his blood via his hemodialysis catheter. Patient is febrile, tachy and tachypneic.  +cough denies urinary symptoms.

## 2022-07-15 NOTE — ED Provider Notes (Signed)
[facility] EMERGENCY DEPARTMENT Provider Note   CSN: [id number] Arrival date & time: 07/15/22  1228    History  Chief Complaint  Patient presents with   Blood Infection    Jeremy [name] is a 41 y.o. male ESRD, T/R/S, DM2, CHF, HTN here for evaluation of fever.  States he had intermittent fever over the last 3 days.  Associated generalized myalgias.  He went to dialysis today and was noted to be febrile up to 103.  There was some concern for bacteremia and he presented here.  States he has had a cough, abdominal pain, nausea and vomiting over the last few days.  He also admits to some persistent lower back pain.  Was seen here less than a week ago for similar symptoms.  Denies history of IVDU, bowel or bladder incontinence, saddle paresthesia.  No meds PTA.  No headache, neck pain, chest pain, shortness of breath, no dysuria or hematuria.  Still makes urine.  Complete approximately 45 minutes of dialysis session.  No sick contacts.  Dialysis through left upper chest dialysis line.  Does not have fistula or graft.  HPI     Home Medications Prior to Admission medications   Medication Sig Start Date End Date Taking? Authorizing Provider  acetaminophen (TYLENOL) 500 MG tablet Take 1,000 mg by mouth every 6 (six) hours as needed for moderate pain.   Yes [provider]  amLODipine (NORVASC) 10 MG tablet Take 1 tablet (10 mg total) by mouth daily. 01/10/22  Yes Axel Filler, MD  atorvastatin (LIPITOR) 80 MG tablet Take 1 tablet (80 mg total) by mouth daily. NEEDS FOLLOW UP APPOINTMENT FOR ANYMORE REFILLS Patient taking differently: Take 80 mg by mouth daily. 05/08/22  Yes Bensimhon, Shaune Pascal, MD  AURYXIA 1 GM 210 MG(Fe) tablet Take 420 mg by mouth 3 (three) times daily. 08/05/21  Yes [provider]  carvedilol (COREG) 25 MG tablet Take 1.5 tablets (37.5 mg total) by mouth 2 (two) times daily with a meal. Need to schedule an appointment for further  refills 05/05/22  Yes [name], MD  cloNIDine (CATAPRES) 0.3 MG tablet Take 1 tablet (0.3 mg total) by mouth 2 (two) times daily. 04/10/21  Yes [name], MD  Doxercalciferol (HECTOROL IV) Inject 1 Dose into the vein Every Tuesday,Thursday,and Saturday with dialysis. 06/05/22 06/04/23 Yes [provider]  glipiZIDE (GLUCOTROL) 5 MG tablet Take 1 tablet (5 mg total) by mouth daily before breakfast. 03/07/22  Yes Amponsah, Charisse March, MD  hydrALAZINE (APRESOLINE) 100 MG tablet Take 1 tablet (100 mg total) by mouth 3 (three) times daily. 01/31/22  Yes Bensimhon, Shaune Pascal, MD  ibuprofen (ADVIL) 200 MG tablet Take 400 mg by mouth every 6 (six) hours as needed for moderate pain.   Yes [provider]  insulin aspart (NOVOLOG FLEXPEN) 100 UNIT/ML FlexPen Inject 15 Units into the skin 3 (three) times daily with meals. Patient taking differently: Inject 17 Units into the skin 3 (three) times daily with meals. 03/25/22 07/15/22 Yes Masters, Katie, DO  insulin glargine (LANTUS) 100 unit/mL SOPN Inject 37 Units into the skin at bedtime. 10/07/21 07/15/22 Yes Gaylan Gerold, DO  lidocaine (LIDODERM) 5 % Place 1 patch onto the skin daily. Remove and Discard patch within 12 hours or as directed. 07/09/22  Yes Prosperi, Christian H, PA-C  methocarbamol (ROBAXIN) 500 MG tablet Take 1 tablet (500 mg total) by mouth 2 (two) times daily. 07/09/22  Yes Prosperi, Christian H, PA-C  multivitamin (RENA-VIT) TABS tablet Take 1 tablet by mouth daily.   Yes [provider]  nitroGLYCERIN (NITROSTAT) 0.4 MG SL tablet PLACE 1 TABLET UNDER THE TONGUE EVERY 5 MINUTES AS NEEDED FOR CHEST PAIN. Patient taking differently: Place 0.4 mg under the tongue every 5 (five) minutes as needed for chest pain. 06/28/19  Yes [name], MD  ondansetron (ZOFRAN-ODT) 4 MG disintegrating tablet Take 1 tablet (4 mg total) by mouth every 8 (eight) hours as needed for nausea or vomiting. 07/02/21  Yes Smoot, Leary Roca,  PA-C  RENVELA 800 MG tablet Take 1,600 mg by mouth 3 (three) times daily with meals. 07/28/21  Yes [provider]  UNABLE TO FIND Inject 1 Dose into the vein Every Tuesday,Thursday,and Saturday with dialysis. Med Name: VIT D injection/IV.   Yes [provider]  Accu-Chek Softclix Lancets lancets Use as directed to check blood sugar four times daily. 02/24/22   Rick Duff, MD  blood glucose meter kit and supplies KIT Dispense based on patient and insurance preference. Use up to four times daily as directed. (FOR ICD-9 250.00, 250.01). 07/21/18   Katherine Roan, MD  Blood Glucose Monitoring Suppl (ACCU-CHEK GUIDE) w/Device KIT Use to test blood sugar 02/24/22   Rick Duff, MD  Blood Pressure Monitoring (FORA P20 BLOOD PRESSURE CUFF) MISC Check your blood pressure everyday in the morning. 02/24/22   Rick Duff, MD  cloNIDine (CATAPRES) 0.3 MG tablet Take 1 tablet (0.3 mg total) by mouth 2 (two) times daily as directed 08/21/21     ferric citrate (AURYXIA) 1 GM 210 MG(Fe) tablet Take 2 tablets (420 mg total) by mouth as directed with meals & 2 with snacks Patient not taking: Reported on 07/15/2022 02/06/22     ferric citrate (AURYXIA) 1 GM 210 MG(Fe) tablet Take 2 tablets (420 mg total) by mouth 3 (three) times daily with meals, AND 2 tablets (420 mg total) 2 (two) times daily with snacks Patient not taking: Reported on 07/15/2022 03/19/22     glucose blood (ACCU-CHEK GUIDE) test strip USE TO CHECK BLOOD SUGAR 4 TIMES DAILY 02/24/22   Rick Duff, MD  Insulin Pen Needle (PEN NEEDLES) 31G X 5 MM MISC use four times daily with insulin 10/07/21 01/05/22  Gaylan Gerold, DO  Insulin Syringe-Needle U-100 (ULTICARE INSULIN SYRINGE) 30G X 1/2" 0.5 ML MISC USE 4 TIMES DAILY WITH INSULIN INJECTIONS. 10/07/21 10/07/22  Iona Beard, MD      Allergies    Acyclovir and related    Review of Systems   Review of Systems  Constitutional:  Positive for fatigue and fever.  HENT:  Negative.    Respiratory:  Positive for cough. Negative for apnea, choking, chest tightness, shortness of breath, wheezing and stridor.   Cardiovascular: Negative.   Gastrointestinal:  Positive for abdominal pain, nausea and vomiting. Negative for abdominal distention, anal bleeding, blood in stool, constipation, diarrhea and rectal pain.  Genitourinary: Negative.   Musculoskeletal:  Positive for back pain. Negative for arthralgias, gait problem, joint swelling, myalgias, neck pain and neck stiffness.  Skin: Negative.   Neurological:  Positive for weakness (generalized). Negative for dizziness, tremors, seizures, syncope, facial asymmetry, speech difficulty, light-headedness, numbness and headaches.  All other systems reviewed and are negative.   Physical Exam Updated Vital Signs BP (!) 174/76 (BP Location: Right Arm)   Pulse 97   Temp 98.5 F (36.9 C) (Oral)   Resp 16   Ht _0  (1.956 m)   Wt 117.9 kg  SpO2 99%   BMI 30.83 kg/m  Physical Exam Vitals and nursing note reviewed.  Constitutional:      General: He is not in acute distress.    Appearance: He is well-developed. He is obese. He is ill-appearing. He is not toxic-appearing or diaphoretic.  HENT:     Head: Normocephalic and atraumatic.     Nose: Nose normal.     Mouth/Throat:     Mouth: Mucous membranes are moist.  Eyes:     Pupils: Pupils are equal, round, and reactive to light.  Cardiovascular:     Rate and Rhythm: Regular rhythm. Tachycardia present.     Pulses: Normal pulses.     Heart sounds: Normal heart sounds.  Pulmonary:     Effort: Pulmonary effort is normal. No respiratory distress.     Breath sounds: Normal breath sounds.     Comments: Clear bil, speaks in full sentences without difficulty Chest:     Comments: Dialysis line left upper chest wall, without surrounding erythema or warmth Abdominal:     General: Bowel sounds are normal. There is no distension.     Palpations: Abdomen is soft.      Tenderness: There is abdominal tenderness.     Comments: Diffuse tenderness throughout abdomen. Neg CVA tap BIL  Musculoskeletal:        General: Tenderness present. No swelling, deformity or signs of injury. Normal range of motion.     Cervical back: Normal range of motion and neck supple.     Right lower leg: No edema.     Left lower leg: No edema.     Comments: Tenderness midline lumbar region into right gluteus.  Positive straight leg raise on right.  Skin:    General: Skin is warm and dry.     Capillary Refill: Capillary refill takes less than 2 seconds.     Comments: No rashes or lesions.  Neurological:     General: No focal deficit present.     Mental Status: He is alert and oriented to person, place, and time.    ED Results / Procedures / Treatments   Labs (all labs ordered are listed, but only abnormal results are displayed) Labs Reviewed  COMPREHENSIVE METABOLIC PANEL - Abnormal; Notable for the following components:      Result Value   CO2 19 (*)    Glucose, Bld 240 (*)    BUN 60 (*)    Creatinine, Ser 9.10 (*)    Albumin 2.8 (*)    GFR, Estimated 7 (*)    Anion gap 16 (*)    All other components within normal limits  CBC WITH DIFFERENTIAL/PLATELET - Abnormal; Notable for the following components:   WBC 17.3 (*)    RBC 3.94 (*)    Hemoglobin 10.6 (*)    HCT 34.0 (*)    RDW 15.8 (*)    Neutro Abs 15.9 (*)    Lymphs Abs 0.6 (*)    Abs Immature Granulocytes 0.11 (*)    All other components within normal limits  PROTIME-INR - Abnormal; Notable for the following components:   Prothrombin Time 15.3 (*)    All other components within normal limits  URINALYSIS, ROUTINE W REFLEX MICROSCOPIC - Abnormal; Notable for the following components:   Glucose, UA 150 (*)    Protein, ur 100 (*)    Bacteria, UA RARE (*)    All other components within normal limits  TROPONIN I (HIGH SENSITIVITY) - Abnormal; Notable for the following components:  Troponin I (High Sensitivity)  213 (*)    All other components within normal limits  TROPONIN I (HIGH SENSITIVITY) - Abnormal; Notable for the following components:   Troponin I (High Sensitivity) 256 (*)    All other components within normal limits  RESP PANEL BY RT-PCR (FLU A&B, COVID) ARPGX2  CULTURE, BLOOD (ROUTINE X 2)  CULTURE, BLOOD (ROUTINE X 2)  URINE CULTURE  LACTIC ACID, PLASMA  APTT  LACTIC ACID, PLASMA    EKG EKG Interpretation  Date/Time:  Tuesday July 15 2022 13:28:51 EST Ventricular Rate:  113 PR Interval:  130 QRS Duration: 82 QT Interval:  332 QTC Calculation: 455 R Axis:   -39 Text Interpretation: Sinus tachycardia Left axis deviation Possible Anterior infarct , age undetermined T wave abnormality, consider lateral ischemia Abnormal ECG No significant change since last tracing Confirmed by Leanord Asal ([id number]) on 07/15/2022 2:47:19 PM  Radiology MR Lumbar Spine W Wo Contrast  Result Date: 07/15/2022 CLINICAL DATA:  Low back pain, infection suspected EXAM: MRI LUMBAR SPINE WITHOUT AND WITH CONTRAST TECHNIQUE: Multiplanar and multiecho pulse sequences of the lumbar spine were obtained without and with intravenous contrast. CONTRAST:  93m GADAVIST GADOBUTROL 1 MMOL/ML IV SOLN COMPARISON:  No prior MRI, correlation is made with CT lumbar spine 07/15/2022 FINDINGS: Segmentation:  Standard. Alignment: Straightening of the normal lumbar lordosis. No listhesis. Vertebrae: No acute fracture or suspicious osseous lesion. Increased T2 signal and minimal contrast enhancement at the anterior endplates about LW2-X9 without definite abnormal signal in the disc space. Congenitally short pedicles, which narrow the AP diameter of the spinal canal. Conus medullaris and cauda equina: Conus extends to the T12-L1 level. Conus and cauda equina appear normal. Paraspinal and other soft tissues: Negative. Disc levels: T12-L1: No significant disc bulge. Mild facet arthropathy. No spinal canal stenosis. No neural  foraminal narrowing. L1-L2: No significant disc bulge. No spinal canal stenosis or neural foraminal narrowing. L2-L3: Minimal disc bulge. Mild facet arthropathy. No spinal canal stenosis. No neural foraminal narrowing. L3-L4: Minimal disc bulge. Mild facet arthropathy. Mild spinal canal stenosis. Narrowing of the lateral recesses. No neural foraminal narrowing. L4-L5: Mild disc bulge. Mild facet arthropathy. No spinal canal stenosis. Narrowing of the lateral recesses. No neural foraminal narrowing. L5-S1: Minimal disc bulge. Mild facet arthropathy. No spinal canal stenosis. No neural foraminal narrowing. IMPRESSION: 1. L3-L4 mild spinal canal stenosis. Narrowing of the lateral recesses at this level could affect the descending L4 nerve roots. 2. Narrowing of the lateral recesses at L4-L5 could affect the descending L5 nerve roots. 3. No neural foraminal narrowing. 4. Edema in the anterior endplates about the LB7-J6disc space, without definite abnormal signal in the disc, favored to be degenerative, although early discitis osteomyelitis can appear similar. Correlate with lab values and consider short interval follow-up. Electronically Signed   By: AMerilyn BabaM.D.   On: 07/15/2022 19:13   CT Angio Chest PE W and/or Wo Contrast  Result Date: 07/15/2022 CLINICAL DATA:  Pulmonary embolism suspected. 150 mL of Omnipaque 350. EXAM: CT ANGIOGRAPHY CHEST WITH CONTRAST TECHNIQUE: Multidetector CT imaging of the chest was performed using the standard protocol during bolus administration of intravenous contrast. Multiplanar CT image reconstructions and MIPs were obtained to evaluate the vascular anatomy. RADIATION DOSE REDUCTION: This exam was performed according to the departmental dose-optimization program which includes automated exposure control, adjustment of the mA and/or kV according to patient size and/or use of iterative reconstruction technique. CONTRAST:  154mOMNIPAQUE IOHEXOL 350 MG/ML SOLN COMPARISON:  None Available. FINDINGS: Cardiovascular: Satisfactory opacification of the pulmonary arteries to the segmental level. No evidence of pulmonary embolism. Normal heart size. No pericardial effusion. Left IJ access double-lumen catheter with distal tip in the right atrium. Mediastinum/Nodes: No enlarged mediastinal, hilar, or axillary lymph nodes. Thyroid gland, trachea, and esophagus demonstrate no significant findings. Lungs/Pleura: Ground-glass attenuation of the lung parenchyma prominent in the dependent portions of bilateral lower lobes concerning for airspace disease. Upper Abdomen: Cholelithiasis without evidence of acute cholecystitis. Musculoskeletal: Cortical irregularity of the left fifth rib posteriorly, likely a chronic fracture. Mild multilevel degenerate disc disease of the thoracic spine. Review of the MIP images confirms the above findings. IMPRESSION: 1. No evidence of pulmonary embolism. 2. Ground-glass attenuation of the lung parenchyma prominent in the dependent portions of bilateral lower lobes concerning for airspace disease. 3. Cholelithiasis without evidence of acute cholecystitis. 4. Cortical irregularity of the left fifth rib posteriorly, likely a chronic fracture. Correlate with localized pain. Electronically Signed   By: Keane Police D.O.   On: 07/15/2022 16:57   CT L-SPINE NO CHARGE  Result Date: 07/15/2022 CLINICAL DATA:  Cough, abdominal pain/back pain, right-sided leg pain. EXAM: CT LUMBAR SPINE WITHOUT CONTRAST TECHNIQUE: Multidetector CT imaging of the lumbar spine was performed without intravenous contrast administration. Multiplanar CT image reconstructions were also generated. RADIATION DOSE REDUCTION: This exam was performed according to the departmental dose-optimization program which includes automated exposure control, adjustment of the mA and/or kV according to patient size and/or use of iterative reconstruction technique. COMPARISON:  Radiographs 07/09/2022 FINDINGS:  Segmentation: The lowest lumbar type non-rib-bearing vertebra is labeled as L5. Alignment: No vertebral subluxation is observed. Vertebrae: Congenitally short pedicles in the lumbar spine. Schmorl's nodes and endplate sclerosis at C1-2 with some vacuum disc phenomenon loss of intervertebral disc height at the L4-5 level. Similar findings were also present on the CT pelvis from/25/16. No lumbar spine fracture or acute bony findings. Paraspinal and other soft tissues: No significant paraspinal edema. Otherwise please see dedicated CT abdomen report. Disc levels: T12-L1: Mild bilateral foraminal impingement due to short pedicles. L1-2: No impingement. L2-3: Mild bilateral foraminal stenosis and mild central narrowing of the thecal sac due to short pedicles and disc bulge. Faint calcification along the posterior annulus fibrosis of the intervertebral disc. L3-4: Moderate central narrowing of the thecal sac and mild to moderate bilateral foraminal stenosis due to disc bulge and short pedicles. L4-5: Moderate central narrowing of the thecal sac and mild bilateral foraminal stenosis due to disc bulge and short pedicles. Calcification along the posterior annulus fibrosis of the intervertebral disc. L5-S1: Mild to moderate bilateral foraminal stenosis and borderline central narrowing of the thecal sac due to short pedicles and degenerative facet arthropathy. Bilateral facet sclerosis is present, right greater than left. No impingement is identified along the sacral plexus. IMPRESSION: 1. Lumbar spondylosis, congenitally short pedicles, and degenerative disc disease, causing moderate impingement at L3-4 and L4-5; mild to moderate impingement at L5-S1; and mild impingement at T12-L1 and L2-3. Electronically Signed   By: Van Clines M.D.   On: 07/15/2022 16:50   CT ABDOMEN PELVIS W CONTRAST  Result Date: 07/15/2022 CLINICAL DATA:  Acute generalized abdominal pain, sepsis. EXAM: CT ABDOMEN AND PELVIS WITH CONTRAST  TECHNIQUE: Multidetector CT imaging of the abdomen and pelvis was performed using the standard protocol following bolus administration of intravenous contrast. RADIATION DOSE REDUCTION: This exam was performed according to the departmental dose-optimization program which includes automated exposure control, adjustment of the mA and/or kV  according to patient size and/or use of iterative reconstruction technique. CONTRAST:  169m OMNIPAQUE IOHEXOL 350 MG/ML SOLN COMPARISON:  Jan 31, 2015.  October 09, 2019. FINDINGS: Lower chest: Mild left basilar atelectasis or infiltrate is noted. Hepatobiliary: Mild cholelithiasis is noted. No biliary dilatation is noted. No focal hepatic abnormality is noted. Pancreas: Unremarkable. No pancreatic ductal dilatation or surrounding inflammatory changes. Spleen: Normal in size without focal abnormality. Adrenals/Urinary Tract: Right adrenal gland appears normal. 2.1 cm left adrenal nodule is noted with average Hounsfield measurement of 38. No hydronephrosis or renal obstruction is noted. 2.8 cm cyst seen in upper pole of left kidney. Two smaller low densities are noted in lower pole which may represent cysts, but renal ultrasound is recommended for confirmation. Urinary bladder is unremarkable. Stomach/Bowel: Stomach is within normal limits. Appendix appears normal. No evidence of bowel wall thickening, distention, or inflammatory changes. Vascular/Lymphatic: No significant vascular findings are present. No enlarged abdominal or pelvic lymph nodes. Reproductive: Prostate is unremarkable. Other: No abdominal wall hernia or abnormality. No abdominopelvic ascites. Musculoskeletal: No acute or significant osseous findings. IMPRESSION: Mild left basilar atelectasis or infiltrate is noted. Mild cholelithiasis without evidence of cholecystitis. 2.1 cm left adrenal nodule is noted. Follow-up CT scan or MRI in 12 months is recommended to ensure stability and rule out neoplasm. 2.8 cm simple  cyst is seen in upper pole of left kidney. Two smaller low densities are noted in the lower pole of left kidney which may represent cyst, but renal ultrasound is recommended for confirmation and to rule out neoplasm. Electronically Signed   By: JMarijo ConceptionM.D.   On: 07/15/2022 16:50   DG Chest 2 View  Result Date: 07/15/2022 CLINICAL DATA:  Suspected sepsis.  Fever. EXAM: CHEST - 2 VIEW COMPARISON:  07/02/2021 FINDINGS: Central line placed from the left has its tip in the proximal right atrium. Heart size is normal. Mildly tortuous aorta. The lungs are clear. No effusions. Old left-sided rib fracture and mild curvature of the spine as seen previously. IMPRESSION: No active cardiopulmonary disease. Central line tip in the proximal right atrium. Electronically Signed   By: MNelson ChimesM.D.   On: 07/15/2022 13:53    Procedures .Critical Care  Performed by: HNettie Elm PA-C Authorized by: HNettie Elm PA-C   Critical care provider statement:    Critical care time (minutes):  35   Critical care was necessary to treat or prevent imminent or life-threatening deterioration of the following conditions:  Sepsis   Critical care was time spent personally by me on the following activities:  Development of treatment plan with patient or surrogate, discussions with consultants, evaluation of patient's response to treatment, examination of patient, ordering and review of laboratory studies, ordering and review of radiographic studies, ordering and performing treatments and interventions, pulse oximetry, re-evaluation of patient's condition and review of old charts     Medications Ordered in ED Medications  lactated ringers infusion ( Intravenous New Bag/Given 07/15/22 1535)  ceFEPIme (MAXIPIME) 2 g in sodium chloride 0.9 % 100 mL IVPB (0 g Intravenous Stopped 07/15/22 1906)    Followed by  ceFEPIme (MAXIPIME) 1 g in sodium chloride 0.9 % 100 mL IVPB (has no administration in time range)   vancomycin (VANCOCIN) 2,500 mg in sodium chloride 0.9 % 500 mL IVPB (2,500 mg Intravenous New Bag/Given 07/15/22 1912)  vancomycin (VANCOCIN) IVPB 1000 mg/200 mL premix (has no administration in time range)  acetaminophen (TYLENOL) tablet 1,000 mg (1,000 mg Oral Given  07/15/22 1325)  metroNIDAZOLE (FLAGYL) IVPB 500 mg (0 mg Intravenous Stopped 07/15/22 1707)  morphine (PF) 4 MG/ML injection 4 mg (4 mg Intravenous Given 07/15/22 1537)  ondansetron (ZOFRAN) injection 4 mg (4 mg Intravenous Given 07/15/22 1536)  iohexol (OMNIPAQUE) 350 MG/ML injection 150 mL (150 mLs Intravenous Contrast Given 07/15/22 1626)  gadobutrol (GADAVIST) 1 MMOL/ML injection 10 mL (10 mLs Intravenous Contrast Given 07/15/22 1831)    ED Course/ Medical Decision Making/ A&P    41 year old ESRD, hypertension, DM2, hypertension. CHF here for evaluation of fever.  Fever over the last 3 days.  He was seen approximately 6 days ago for midline lumbar back pain thought to be radicular in nature, diagnosed with sciatica.  Symptoms had actually started with low back pain 2 weeks prior to that.  No history of IV drug use, bowel or bladder incontinence, saddle paresthesia.  His lower back pain has not improved.  Has noted over the last few days he has had mild nonproductive cough, diffuse abdominal pain, nausea and vomiting unable to keep down p.o. intake at home.  He feels generally weak.  Went to dialysis today had approximately 45 minutes session and was noted to be febrile to 103.  There is concern for sepsis due to dialysis line infection subsequently sent here for further work-up.  No sick contacts.  Does have diffuse abdominal pain.  Tenderness to his midline lumbar region, positive straight leg on right.  Triage note he noted chest pain and shortness of breath.  Patient states he did have this during his dialysis session was resolved by the time he came off the machine.  No lower extremity edema, does not appear grossly fluid overloaded.   Still makes urine however denies any dysuria hematuria.  We will plan on labs and imaging.  Initial evaluation code sepsis called.  Broad-spectrum antibiotics given symptoms possible multiple sources.  Defervesced with antipyretics from triage.  Still mildly tachycardic and tachypneic.  No hypoxia.  Maps greater than 65, lactic acid 1.7 we will hold on large fluid bolus given dialysis patient.  Labs personally viewed and interpreted:  CBC leukocytosis 17.3, Hgb10.6, mild decrease from baseline CMP glucose 240, creatinine 9.10, anion gap 16 Lactic acid 1.7 Troponin 256>>213 downtrending suspect due to sepsis, low suspicion for ACS, no current pain Chest xray without infiltrates, cardiomegaly, pul edema, pneumothorax  CT AP with cholelithiasis without cholecystis CTA Chest without PE, Does show lower lobe airspace process CT l spine with impingement   Patient reassessed.  Discussed labs and imaging.  Be admitted for sepsis.  Could have multiple sources, pneumonia, line infection, early lumbar?  Patient agreeable.  The patient appears reasonably stabilized for admission considering the current resources, flow, and capabilities available in the ED at this time, and I doubt any other [facility] requiring further screening and/or treatment in the ED prior to admission.   CONSULT with IM teaching who is agreeable to evaluate patient for admission.                           Medical Decision Making Amount and/or Complexity of Data Reviewed Independent Historian: friend External Data Reviewed: labs, radiology, ECG and notes. Labs: ordered. Decision-making details documented in ED Course. Radiology: ordered and independent interpretation performed. Decision-making details documented in ED Course. ECG/medicine tests: ordered and independent interpretation performed. Decision-making details documented in ED Course.  Risk OTC drugs. Prescription drug management. Parenteral controlled  substances. Decision regarding hospitalization. Diagnosis or  treatment significantly limited by social determinants of health.         Final Clinical Impression(s) / ED Diagnoses Final diagnoses:  Sepsis without acute organ dysfunction, due to unspecified organism ([place])  ESRD (end stage renal disease) ([place])  Pneumonia of both lower lobes due to infectious organism  Elevated troponin  Acute midline low back pain with right-sided sciatica  Hyperglycemia    Rx / DC Orders ED Discharge Orders     None         Unique Sillas A, PA-C 07/15/22 2024    Lajean Saver, MD 07/15/22 2208

## 2022-07-15 NOTE — ED Notes (Signed)
Patient transported to CT 

## 2022-07-15 NOTE — Progress Notes (Signed)
Pharmacy Antibiotic Note  Jeremy [name] is a 41 y.o. male admitted on 07/15/2022 with sepsis.  Pharmacy has been consulted for vancomycin and cefepime dosing.  Patient presents with generalized myalgia and intermittent fever over the last 3 days. Has history of ESRD on HD TThSat - went to dialysis today and was noted to be febrile to 103, WBC 17.3. Team would like to start broad spectrum antibiotics for sepsis.  Plan: Give vancomycin 2500mg  IV x1 Start vancomycin 1000mg  IV w/ HD on HD days Give cefepime 2g IV x1 Start cefepime 1g IV q24 hours Monitor clinical status, culture data, and LOT Follow-up HD plan  Height: 6\' 5"  (195.6 cm) Weight: 117.9 kg (260 lb) IBW/kg (Calculated) : 89.1  Temp (24hrs), Avg:100.4 F (38 C), Min:99.6 F (37.6 C), Max:101.1 F (38.4 C)  Recent Labs  Lab 07/15/22 1315  WBC 17.3*  CREATININE 9.10*  LATICACIDVEN 1.7    Estimated Creatinine Clearance: 15 mL/min (A) (by C-G formula based on SCr of 9.1 mg/dL (H)).    Allergies  Allergen Reactions   Acyclovir And Related Hives    Antimicrobials this admission: cefepime 11/7 >>  vancomycin 11/7 >>   Dose adjustments this admission: N/A  Microbiology results: 11/7 BCx:  11/7 RVP:   Thank you for allowing pharmacy to be a part of this patient's care.  Louanne Belton, PharmD, [facility] PGY1 Pharmacy Resident 07/15/2022 4:17 PM

## 2022-07-15 NOTE — ED Notes (Signed)
Patient reports understanding of the need for a urine sample

## 2022-07-15 NOTE — H&P (Incomplete)
Date: 07/16/2022               Patient Name:  Jeremy [name] MRN: [id number]  DOB: 1981-01-07 Age / Sex: 41 y.o., male   PCP: Rick Duff, MD         Medical Service: Internal Medicine Teaching Service         Attending Physician: Dr. Charise Killian, MD      First Contact: Dr. Gaylyn Rong, MD Pager [phone]    Second Contact: Dr. Farrel Gordon, DO Pager [phone]         After Hours (After 5p/  First Contact Pager: [phone]  weekends / holidays): Second Contact Pager: [phone]   SUBJECTIVE   Chief Complaint: Fever  History of Present Illness:   [name] is a 41 year old male with past medical history of ESRD TTS, CHF, HTN presents to the emergency department with a three day history of intermittent fevers and nightsweats.  For the last three days he would wake up with his shirt and bed drenched in sweat.  During dialysis today, he started to tremble and temperature at that time was 99, but upon admission to the ED temperature was 101. He started to feel short of breath at dialysis and required a nasal cannula of oxygen. He also stated he started to develop this sharp non-radiating central chest pain when shortness of breath came on during dialysis. The chest pain dissipated when he was put on oxygen. Yesterday, pt had nausea and vomiting that lasted all day, and abdomen is sore due to vomiting episodes. Vomit was brown and more liquid than food, no diarrhea or constipation. He is still able to make urine, and has had a central line dialysis catheter in for the past year. He has missed follow up appointments for permanent catheter placement.   Pt has also been complaining of back pain for the past week or so that came on suddenly. The pain is located in the R Lumbar area. The pain is sharp and radiates down his right leg when he ambulates. No pain is present on rest. He was evaluated in the emergency department on 11/1, and was diagnosed with sciatica. He was given muscle relaxer  and pain medication however reports that nothing has helped.    Trembling during dialysis and fever for last 3 days. During dialysis his highest temperature was 99 degrees. In the ED it was 101. He's had the back pain for the past week. Denies any trauma to the back.   Was short of breath at dialysis and needed oxygen. His chest started hurting during dialysis. The pain was central chest. No radiation. Chest pain no longer present. Pressure/sharp pain. It went away once he calmed down and received the morphine in the ED.   He's been having night sweats for the past 4 days.   Was told his back pain was secondary to sciatica. He was evaluated in the ED on 11/1. Back pain going on since last Sunday. Lower middle right side of back. Pain is worse with ambulation.   He had nausea and vomiting yesterday. His abdomen was sore after vomiting episodes. Brown colored vomit, more liquid than food. Denies diarrhea.   Cough started three days, thick mucous.   Still makes urine  Missed two dialysis   Dialysis patient, central line that's been palced for over a year, has misesd all of his appointments to establish permanent access  Fevers chills night sweats  Missed dialysis Thursday Saturday, only got  an hour today  No emergenet signs for dialysis, but was calling to figure out if you think he should wait for dialysis tomorrow before we pull the line, or if you think he can get dialysis sometime tonight. Keep the line in until we get cultures back. What's the best way to go about this.   Stable, no signs of volume overlaod on exam.   follows with [facility] and has had this three day    ED Course: Initial concern was for infectious process, pt was given a dose of vancomycin, metronidazole, and one dose of cefepime, and IMTS was consulted for admission   Meds:  Amlodipine 10mg   Lipitor 80mg   Coreg 25mg   Denies taking Auryxia  Clonidine .3mg  Glipizide 5mg   Hydralazine 100mg  TID Novolog  17U TID w/ meals Lantus 35U bedtime Renvela 800mg   Nitroglycerin     Current Meds  Medication Sig  . acetaminophen (TYLENOL) 500 MG tablet Take 1,000 mg by mouth every 6 (six) hours as needed for moderate pain.  [name] amLODipine (NORVASC) 10 MG tablet Take 1 tablet (10 mg total) by mouth daily.  [name] atorvastatin (LIPITOR) 80 MG tablet Take 1 tablet (80 mg total) by mouth daily. NEEDS FOLLOW UP APPOINTMENT FOR ANYMORE REFILLS (Patient taking differently: Take 80 mg by mouth daily.)  . AURYXIA 1 GM 210 MG(Fe) tablet Take 420 mg by mouth 3 (three) times daily.  . carvedilol (COREG) 25 MG tablet Take 1.5 tablets (37.5 mg total) by mouth 2 (two) times daily with a meal. Need to schedule an appointment for further refills  . cloNIDine (CATAPRES) 0.3 MG tablet Take 1 tablet (0.3 mg total) by mouth 2 (two) times daily.  [name] Doxercalciferol (HECTOROL IV) Inject 1 Dose into the vein Every Tuesday,Thursday,and Saturday with dialysis.  [name] glipiZIDE (GLUCOTROL) 5 MG tablet Take 1 tablet (5 mg total) by mouth daily before breakfast.  . hydrALAZINE (APRESOLINE) 100 MG tablet Take 1 tablet (100 mg total) by mouth 3 (three) times daily.  [name] ibuprofen (ADVIL) 200 MG tablet Take 400 mg by mouth every 6 (six) hours as needed for moderate pain.  [name] insulin aspart (NOVOLOG FLEXPEN) 100 UNIT/ML FlexPen Inject 15 Units into the skin 3 (three) times daily with meals. (Patient taking differently: Inject 17 Units into the skin 3 (three) times daily with meals.)  . insulin glargine (LANTUS) 100 unit/mL SOPN Inject 37 Units into the skin at bedtime.  . lidocaine (LIDODERM) 5 % Place 1 patch onto the skin daily. Remove and Discard patch within 12 hours or as directed.  . methocarbamol (ROBAXIN) 500 MG tablet Take 1 tablet (500 mg total) by mouth 2 (two) times daily.  . multivitamin (RENA-VIT) TABS tablet Take 1 tablet by mouth daily.  . nitroGLYCERIN (NITROSTAT) 0.4 MG SL tablet PLACE 1 TABLET UNDER THE TONGUE EVERY 5 MINUTES AS  NEEDED FOR CHEST PAIN. (Patient taking differently: Place 0.4 mg under the tongue every 5 (five) minutes as needed for chest pain.)  . ondansetron (ZOFRAN-ODT) 4 MG disintegrating tablet Take 1 tablet (4 mg total) by mouth every 8 (eight) hours as needed for nausea or vomiting.  [name] RENVELA 800 MG tablet Take 1,600 mg by mouth 3 (three) times daily with meals.  [name] UNABLE TO FIND Inject 1 Dose into the vein Every Tuesday,Thursday,and Saturday with dialysis. Med Name: VIT D injection/IV.  . [DISCONTINUED] amLODipine (NORVASC) 10 MG tablet Take 1 tablet (10 mg total) by mouth daily.    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  Meds Amlodipine Atorvastatin Clonidine Hydralazine Coreg No longer on imdur  Past Surgical History:  Procedure Laterality Date  . A/V FISTULAGRAM N/A 08/12/2021   Procedure: A/V FISTULAGRAM;  Surgeon: Waynetta Sandy, MD;  Location: [place] [facility];  Service: Cardiovascular;  Laterality: N/A;  . AV FISTULA PLACEMENT Right 12/18/2020   Procedure: RIGHT ARM RADIOCEPHALIC  ARTERIOVENOUS (AV) FISTULA CREATION;  Surgeon: Waynetta Sandy, MD;  Location: [place];  Service: Vascular;  Laterality: Right;  . CARDIAC CATHETERIZATION N/A 02/01/2015   Procedure: Right/Left Heart Cath and Coronary Angiography;  Surgeon: Josue Hector, MD;  Location: [place] [facility];  Service: Cardiovascular;  Laterality: N/A;  . IR FLUORO GUIDE CV LINE RIGHT  10/11/2019  . IR US GUIDE VASC ACCESS RIGHT  10/11/2019  . THORACOTOMY Left 11/08/2013   Procedure: LEFT THORACOTOMY;  Surgeon: Ivin Poot, MD;  Location: [place];  Service: Thoracic;  Laterality: Left;    Social:  Lives With: Girlfriend, has kids but they do not live with him Occupation: Landscaping Support: Family in the area Level of Function: Able to perform all ADLs/IADLs when no acutely ill PCP: Dr. Rick Duff, MD  Substances:  Denies tobacco alcohol or other illicit substances. Denies injecting any substances  Family History:  Father - cirrhosis, liver cancer Mother - Stomach cancer  Allergies: Allergies as of 07/15/2022 - Review Complete 07/15/2022  Allergen Reaction Noted  . Acyclovir and related Hives 11/28/2016     Review of Systems: A complete ROS was negative except as per HPI.   OBJECTIVE:   Physical Exam: Blood pressure (!) 178/102, pulse (!) 105, temperature 98.5 F (36.9 C), temperature source Oral, resp. rate 20, height 6\' 5"  (1.956 m), weight 117.9 kg, SpO2 95 %.  Constitutional:Obese gentleman, in no acute distress HENT: normocephalic atraumatic, mucous membranes moist Eyes: conjunctiva non-erythematous Neck: supple Cardiovascular: regular rate and rhythm, no murmurs heard Pulmonary/Chest: normal work of breathing on room air, lungs clear to auscultation bilaterally Abdominal: soft, non-tender, non-distended MSK: normal bulk and tone, straight leg test negative Neurological: alert & oriented x 3 Skin: warm and dry, central line dialysis graft located on L Subclavian not erythemetous or tender to palpation Psych: normal mood and affect  Labs: CBC    Component Value Date/Time   WBC 17.3 (H) 07/15/2022 1315   RBC 3.94 (L) 07/15/2022 1315   HGB 10.6 (L) 07/15/2022 1315   HCT 34.0 (L) 07/15/2022 1315   HCT 23.7 (L) 05/30/2016 1157   PLT 359 07/15/2022 1315   MCV 86.3 07/15/2022 1315   MCH 26.9 07/15/2022 1315   MCHC 31.2 07/15/2022 1315   RDW 15.8 (H) 07/15/2022 1315   LYMPHSABS 0.6 (L) 07/15/2022 1315   MONOABS 0.6 07/15/2022 1315   EOSABS 0.1 07/15/2022 1315   BASOSABS 0.1 07/15/2022 1315     CMP     Component Value Date/Time   NA 138 07/15/2022 1315   NA 141 10/15/2020 1015   K 4.5 07/15/2022 1315   CL 103 07/15/2022 1315   CO2 19 (L) 07/15/2022 1315   GLUCOSE 240 (H) 07/15/2022 1315   BUN 60 (H) 07/15/2022 1315   BUN 62 (H) 10/15/2020 1015   CREATININE 9.10 (H)  07/15/2022 1315   CALCIUM 9.1 07/15/2022 1315   PROT 7.7 07/15/2022 1315   ALBUMIN 2.8 (L) 07/15/2022 1315   AST 25 07/15/2022 1315   ALT 26 07/15/2022 1315   ALKPHOS 55 07/15/2022  1315   BILITOT 0.8 07/15/2022 1315   GFRNONAA 7 (L) 07/15/2022 1315   GFRAA 9 (L) 10/15/2020 1015    Imaging:  Chest X-Ray :  No active cardiopulmonary disease. Central line tip in the proximal right atrium.   CT Lumbar Spine  1. Lumbar spondylosis, congenitally short pedicles, and degenerative disc disease, causing moderate impingement at L3-4 and L4-5; mild to moderate impingement at L5-S1; and mild impingement at T12-L1 and L2-3.  MR Lumbar Spine 1. L3-L4 mild spinal canal stenosis. Narrowing of the lateral recesses at this level could affect the descending L4 nerve roots. 2. Narrowing of the lateral recesses at L4-L5 could affect the descending L5 nerve roots. 3. No neural foraminal narrowing. 4. Edema in the anterior endplates about the M7-E6 disc space, without definite abnormal signal in the disc, favored to be degenerative, although early discitis osteomyelitis can appear similar. Correlate with lab values and consider short interval follow-up.    EKG: personally reviewed my interpretation is sinus tachycardia, no prolonged intervals, left axis deviation, similar to previous EKG in 10/22.   ASSESSMENT & PLAN:   Assessment & Plan by Problem: Principal Problem:   Fever   Kawika Bischoff is a 41 y.o. person living with a history of ESRD TTS, CHF, HTN  who presented with three day history of fever and admitted for potential bacteremia on hospital day 1  #Acute Fever, potential bacteremia of unknown source Pt has a three day history of fever, night sweats, and productive cough. He has ,ult  *** ***  #HFrEF EF 155-50% on 06/28/19, NYHA II  Follows with Dr. Marigene Ehlers. Last echo revaled an EF of 55-60% on 06/28/19, LV was normal function, no left ventiruclar hypertrophy seen, and no  regional wall motion abnormalities.   Diet: {NAMES:3044014::"Normal","Heart Healthy","Carb-Modified","Renal","Carb/Renal","NPO","TPN","Tube Feeds"} VTE: {NAMES:3044014::"Heparin","Enoxaparin","SCDs","DOAC","None"} IVF: {NAMES:3044014::"None","NS","1/2 NS","LR","D5","D10"},{NAMES:3044014::"None","10cc/hr","25cc/hr","50cc/hr","75cc/hr","100cc/hr","110cc/hr","125cc/hr","Bolus"} Code: {NAMES:3044014::"Full","DNR","DNI","DNR/DNI","Comfort Care","Unknown"}  Prior to Admission Living Arrangement: {NAMES:3044014::"Home, living ***","SNF, ***","Homeless","***"} Anticipated Discharge Location: {NAMES:3044014::"Home","SNF","CIR","***"} Barriers to Discharge: ***  Dispo: Admit patient to {STATUS:3044014::"Observation with expected length of stay less than 2 midnights.","Inpatient with expected length of stay greater than 2 midnights."}  Signed: Drucie Opitz, MD Internal Medicine Resident PGY-1  07/16/2022, 12:01 AM   {Intern/Pager:[phone]}

## 2022-07-15 NOTE — H&P (Incomplete)
Date: 07/16/2022               Patient Name:  Jeremy [name] MRN: [id number]  DOB: 1980-12-06 Age / Sex: 41 y.o., male   PCP: Rick Duff, MD         Medical Service: Internal Medicine Teaching Service         Attending Physician: Dr. Charise Killian, MD      First Contact: Dr. Gaylyn Rong, MD Pager [phone]    Second Contact: Dr. Farrel Gordon, DO Pager [phone]         After Hours (After 5p/  First Contact Pager: [phone]  weekends / holidays): Second Contact Pager: [phone]   SUBJECTIVE   Chief Complaint: Fever  History of Present Illness:   Mr. Raheel Kunkle is a 41 year old male with past medical history of ESRD TTS, HFrEF, HTN, insulin dependent T2DM presents to the emergency department with a three day history of intermittent fevers and nightsweats.  For the last three days he would wake up with his shirt and bed drenched in sweat.  During dialysis today, he started to tremble and temperature at that time was 99, but upon admission to the ED temperature was 101. He started to feel short of breath at dialysis and required a nasal cannula of oxygen. He also stated he started to develop this sharp non-radiating central chest pain when shortness of breath came on during dialysis. The chest pain dissipated when he was put on oxygen. Yesterday, pt had nausea and vomiting that lasted all day, and abdomen is sore due to vomiting episodes. Vomit was brown and more liquid than food, no diarrhea or constipation. He has also had a thick-mucous productive cough for the last two days. He is still able to make urine, and has had a central line dialysis catheter in for the past year. He has missed follow up appointments for permanent catheter placement.   Pt has also been complaining of back pain for the past week or so that came on suddenly. The pain is located in the R Lumbar area off midline. The pain is sharp and radiates down his right leg when he ambulates. No pain is present on rest. He was  evaluated in the emergency department on 11/1, and was diagnosed with sciatica. He was given muscle relaxer and pain medication however reports that nothing has helped.    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  Hypertension Non-ischemic HFrEF Insulin dependent Type 2 Diabetes HLD   Past Surgical History:  Procedure Laterality Date   A/V FISTULAGRAM N/A 08/12/2021   Procedure: A/V FISTULAGRAM;  Surgeon: Waynetta Sandy, MD;  Location: [facility];  Service: Cardiovascular;  Laterality: N/A;   AV FISTULA PLACEMENT Right 12/18/2020   Procedure: RIGHT ARM RADIOCEPHALIC  ARTERIOVENOUS (AV) FISTULA CREATION;  Surgeon: Waynetta Sandy, MD;  Location: [facility];  Service: Vascular;  Laterality: Right;   CARDIAC CATHETERIZATION N/A 02/01/2015   Procedure: Right/Left Heart Cath and Coronary Angiography;  Surgeon: Josue Hector, MD;  Location: [facility];  Service: Cardiovascular;  Laterality: N/A;  IR FLUORO GUIDE CV LINE RIGHT  10/11/2019   IR US GUIDE VASC ACCESS RIGHT  10/11/2019   THORACOTOMY Left 11/08/2013   Procedure: LEFT THORACOTOMY;  Surgeon: Ivin Poot, MD;  Location: [facility];  Service: Thoracic;  Laterality: Left;    Social:  Lives With: Girlfriend, has kids but they do not live with him Occupation: Landscaping Support: Family in the area Level of Function: Able to perform all ADLs/IADLs when not acutely ill PCP: Dr. Rick Duff, MD  Substances: Denies tobacco alcohol or other illicit substances. Denies injecting any substances  Family History:   Father - cirrhosis, liver cancer Mother - Stomach cancer  Allergies: Allergies as of 07/15/2022 - Review Complete 07/15/2022  Allergen Reaction Noted   Acyclovir and related Hives 11/28/2016     Review of Systems: A complete ROS was negative except as per HPI.   OBJECTIVE:   Physical Exam: Blood pressure (!) 178/102, pulse (!) 105, temperature 98.5 F (36.9 C), temperature source Oral, resp. rate 20, height 6\' 5"  (1.956 m), weight 117.9 kg, SpO2 95 %.  Constitutional:Obese gentleman, in no acute distress HENT: normocephalic atraumatic, mucous membranes moist Eyes: conjunctiva non-erythematous Neck: supple Cardiovascular: regular rate and rhythm, no murmurs heard Pulmonary/Chest: normal work of breathing on room air, lungs clear to auscultation bilaterally Abdominal: soft, non-tender, non-distended MSK: normal bulk and tone, no spinal deformities, no tenderness to palpation of spinous processes, no erythema,  straight leg test negative Neurological: alert & oriented x 3 Skin: warm and dry, central line dialysis graft located on L Subclavian not erythemetous or tender to palpation Psych: normal mood and affect  Labs: CBC    Component Value Date/Time   WBC 17.3 (H) 07/15/2022 1315   RBC 3.94 (L) 07/15/2022 1315   HGB 10.6 (L) 07/15/2022 1315   HCT 34.0 (L) 07/15/2022 1315   HCT 23.7 (L) 05/30/2016 1157   PLT 359 07/15/2022 1315   MCV 86.3 07/15/2022 1315   MCH 26.9 07/15/2022 1315   MCHC 31.2 07/15/2022 1315   RDW 15.8 (H) 07/15/2022 1315   LYMPHSABS 0.6 (L) 07/15/2022 1315   MONOABS 0.6 07/15/2022 1315   EOSABS 0.1 07/15/2022 1315   BASOSABS 0.1 07/15/2022 1315     CMP     Component Value Date/Time   NA 138 07/15/2022 1315   NA 141 10/15/2020 1015   K 4.5 07/15/2022 1315   CL 103 07/15/2022 1315   CO2 19 (L) 07/15/2022 1315   GLUCOSE 240 (H) 07/15/2022 1315   BUN 60 (H) 07/15/2022 1315   BUN 62 (H) 10/15/2020 1015   CREATININE 9.10 (H) 07/15/2022 1315    CALCIUM 9.1 07/15/2022 1315   PROT 7.7 07/15/2022 1315   ALBUMIN 2.8 (L) 07/15/2022 1315   AST 25 07/15/2022 1315   ALT 26 07/15/2022 1315   ALKPHOS 55 07/15/2022 1315   BILITOT 0.8 07/15/2022 1315   GFRNONAA 7 (L) 07/15/2022 1315   GFRAA 9 (L) 10/15/2020 1015    Imaging:  Chest X-Ray :  No active cardiopulmonary disease. Central line tip in the proximal right atrium.   CT Lumbar Spine  1. Lumbar spondylosis, congenitally short pedicles, and degenerative disc disease, causing moderate impingement at L3-4 and L4-5; mild to moderate impingement at L5-S1; and mild impingement at T12-L1 and L2-3.  MR Lumbar Spine 1. L3-L4 mild spinal canal stenosis. Narrowing of the lateral recesses at this level could affect the descending L4 nerve roots. 2. Narrowing of the lateral recesses at L4-L5 could affect  the descending L5 nerve roots. 3. No neural foraminal narrowing. 4. Edema in the anterior endplates about the W1-X9 disc space, without definite abnormal signal in the disc, favored to be degenerative, although early discitis osteomyelitis can appear similar. Correlate with lab values and consider short interval follow-up.    EKG: personally reviewed my interpretation is sinus tachycardia, no prolonged intervals, left axis deviation, similar to previous EKG in 10/22.   ASSESSMENT & PLAN:   Assessment & Plan by Problem: Principal Problem:   Fever   Mandell Pangborn is a 41 y.o. person living with a history of ESRD TTS, CHF, HTN  who presented with three day history of fever and admitted for potential bacteremia on hospital day 1  #E. Faecalis Bacteremia  Pt has a three day history of fever, night sweats, and productive cough. WBC is elevated at 17.3. He is at increased risk for infection due to central catheter being placed for over a year, however on exam no evidence of active infection was noted at catheter site. Chest X-Ray revealed no active cardiopulmonary disease, however  CT A/P revealed mild left basilar atelectasis vs infiltrate, and CT chest more concerning for airway disease. Cough most likely linked to hypervolemia in the setting  Do not think procal would be useful in this setting due to ESRD. UA is not concerning for UTI. COVID and Flu panel are negative.   Unclear source of infection at this time, however pt's catheter has been in place for over a year, could be a catheter associated infection that has led to bacteremia which would best encompass patient's symptoms at this time. Will leave line in place at this time until blood cultures return positive. At that point, will obtain Echo and consider consulting neurosurgery for possible osteomyelitis of spine, with back pain and possible early osteomyelitis on MRI. Physical exam is less concerning for osteomyelitis as there is no tenderness on palpation of area.   Do not believe patient is septic at this time. There is a source of infection with his central line catheter, however no sign of organ dysfunction/worsening chronic conditions.    Cultures returned 4/4 positive for Enterococcus Faecalis. Consulted pharmacy about dosing, and they suggested Ampicillin 2g q12H for 10-14 days.    Plan:  - Ampicillin 2g Q12H for 10-14 days (Day 1) - Monitor CBC and fever  - Will need to discuss with nephrology removal of catheter in the setting of bacteremia due to patient missing two dialysis days.  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 Pt saw vascular in September of this year and per their note they were going to fix his right AV fistula, and plan was for new fistula creation. He has had central line access placed for about a year. Due to back pain, patient has missed two days of dialysis (Thursday  and Saturday). He was able to tolerate only an hour and a half of dialysis today before symptom onset. Have already discussed case with nephrology and they will see him in the AM. . Do not believe he requires emergent dialysis, and pt states right now he is two pounds under his dry weight of 125kg.   Plan:  - Continue [place]  - Resume dialysis treatment while inpatient per nephrology - Nephrology consulted, appreciate assistance   #  Back Pain  Pt was recently seen by the emergency department for evaluation of back pain that has lasted about a week. He was diagnosed with sciatica and sent home with muscle relaxer that provided minimal relief. Pain starts in the R lower back, and comes down the L4/L5 dermatome.  Straight leg test was negative, and other differentials include meralgia paresthetica. L4-L5 on MRI showed mild disc budge and narrowing of the lateral recesses, which is favored to be degenerative in nature, although early discitis osteomyelitis can appear similar. If bacteremia is confirmed with blood cultures, would consider further investigation by consulting neurosurgery as pt would be at risk for seeding.    Pt did have a fever, however no other red flag symptoms such as neurological deficits, not an IVDU, or unexplained weight loss, trouble urinating or having a bowel movement. Can consider Elias Else if pain persists, but would try to avoid given ESRD.   Plan:  - Acetaminophen 1000mg  Q6H PRN for back pain.   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 Marigene Ehlers. Last echo revealed an EF of 55-60% on 06/28/19, LV was normal function, no left ventricular hypertrophy seen, and no regional wall motion abnormalities. GDMT management unoptimized due to ESRD, and home medications are coreg 37.5. Does not seem to be in a heart failure exacerbation at the moment.   Plan:  - Continue Coreg 37.5.  #Tropinemia  Pt had  elevated tropnins at 256, then repeat at 212. Likely in the setting of ESRD and missed dialysis days.  #Type 2 Diabetes Mellitus  A1c checked this admission was 8.2, increased from a year ago at 7.2. His home regimen includes Lantus 35U at bedtime, and Novolog 17U with meals. With nausea, vomiting, concern for bacteremia, will lower home regimen and titrate as needed.   Plan:  - Lantus 25U At bedtime  - Novolog 12U with meals  - CBG Checks  #Anemia of Chronic Disease #Hx of Iron Deficiency Anemia Pt's hemoglobin is 10.6, likely in the setting of ESRD. Last iron studies were done two years ago in 2021, and pt not taking prescribed [place]. Will check iron panel.  Plan:  - Monitor CBC - Follow up Iron Studies  Diet: Renal VTE: Heparin IVF: None,None Code: Full  Prior to Admission Living Arrangement: Home, living   Anticipated Discharge Location: Home Barriers to Discharge: Medical Management  Dispo: Admit patient to Inpatient with expected length of stay greater than 2 midnights.  Signed: Drucie Opitz, MD Internal Medicine Resident PGY-1  07/16/2022, 12:01 AM   Dr. Drucie Opitz, MD Pager [phone]

## 2022-07-16 ENCOUNTER — Inpatient Hospital Stay (HOSPITAL_COMMUNITY): Payer: Medicaid Other

## 2022-07-16 DIAGNOSIS — R7881 Bacteremia: Secondary | ICD-10-CM

## 2022-07-16 DIAGNOSIS — A498 Other bacterial infections of unspecified site: Secondary | ICD-10-CM | POA: Diagnosis not present

## 2022-07-16 DIAGNOSIS — R5081 Fever presenting with conditions classified elsewhere: Secondary | ICD-10-CM

## 2022-07-16 DIAGNOSIS — B952 Enterococcus as the cause of diseases classified elsewhere: Secondary | ICD-10-CM | POA: Insufficient documentation

## 2022-07-16 DIAGNOSIS — R509 Fever, unspecified: Secondary | ICD-10-CM

## 2022-07-16 DIAGNOSIS — T80211D Bloodstream infection due to central venous catheter, subsequent encounter: Secondary | ICD-10-CM | POA: Diagnosis not present

## 2022-07-16 DIAGNOSIS — T80211A Bloodstream infection due to central venous catheter, initial encounter: Secondary | ICD-10-CM

## 2022-07-16 DIAGNOSIS — N186 End stage renal disease: Secondary | ICD-10-CM | POA: Diagnosis not present

## 2022-07-16 DIAGNOSIS — I120 Hypertensive chronic kidney disease with stage 5 chronic kidney disease or end stage renal disease: Secondary | ICD-10-CM | POA: Diagnosis not present

## 2022-07-16 DIAGNOSIS — Z992 Dependence on renal dialysis: Secondary | ICD-10-CM | POA: Diagnosis not present

## 2022-07-16 DIAGNOSIS — I330 Acute and subacute infective endocarditis: Secondary | ICD-10-CM | POA: Diagnosis not present

## 2022-07-16 DIAGNOSIS — D631 Anemia in chronic kidney disease: Secondary | ICD-10-CM | POA: Diagnosis not present

## 2022-07-16 DIAGNOSIS — N250 Renal osteodystrophy: Secondary | ICD-10-CM | POA: Diagnosis not present

## 2022-07-16 LAB — BLOOD CULTURE ID PANEL (REFLEXED) - BCID2

## 2022-07-16 LAB — IRON AND TIBC
Iron: 30 ug/dL — ABNORMAL LOW (ref 45–182)
Saturation Ratios: 24 % (ref 17.9–39.5)
TIBC: 123 ug/dL — ABNORMAL LOW (ref 250–450)
UIBC: 93 ug/dL

## 2022-07-16 LAB — CBC WITH DIFFERENTIAL/PLATELET
Abs Immature Granulocytes: 0.06 10*3/uL (ref 0.00–0.07)
Basophils Absolute: 0.1 10*3/uL (ref 0.0–0.1)
Basophils Relative: 1 %
Eosinophils Absolute: 0.1 10*3/uL (ref 0.0–0.5)
Eosinophils Relative: 1 %
HCT: 27.5 % — ABNORMAL LOW (ref 39.0–52.0)
Hemoglobin: 8.8 g/dL — ABNORMAL LOW (ref 13.0–17.0)
Immature Granulocytes: 1 %
Lymphocytes Relative: 14 %
Lymphs Abs: 1.7 10*3/uL (ref 0.7–4.0)
MCH: 27.7 pg (ref 26.0–34.0)
MCHC: 32 g/dL (ref 30.0–36.0)
MCV: 86.5 fL (ref 80.0–100.0)
Monocytes Absolute: 0.9 10*3/uL (ref 0.1–1.0)
Monocytes Relative: 8 %
Neutro Abs: 9.4 10*3/uL — ABNORMAL HIGH (ref 1.7–7.7)
Neutrophils Relative %: 75 %
Platelets: 320 10*3/uL (ref 150–400)
RBC: 3.18 MIL/uL — ABNORMAL LOW (ref 4.22–5.81)
RDW: 15.9 % — ABNORMAL HIGH (ref 11.5–15.5)
WBC: 12.3 10*3/uL — ABNORMAL HIGH (ref 4.0–10.5)
nRBC: 0 % (ref 0.0–0.2)

## 2022-07-16 LAB — ECHOCARDIOGRAM COMPLETE
Area-P 1/2: 4.43 cm2
Calc EF: 57.6 %
Height: 77 in
P 1/2 time: 389 msec
S' Lateral: 4 cm
Single Plane A2C EF: 57.5 %
Single Plane A4C EF: 55.7 %
Weight: 4160 oz

## 2022-07-16 LAB — FERRITIN: Ferritin: 992 ng/mL — ABNORMAL HIGH (ref 24–336)

## 2022-07-16 LAB — HEMOGLOBIN A1C
Hgb A1c MFr Bld: 8.2 % — ABNORMAL HIGH (ref 4.8–5.6)
Mean Plasma Glucose: 188.64 mg/dL

## 2022-07-16 LAB — RENAL FUNCTION PANEL
Albumin: 2.4 g/dL — ABNORMAL LOW (ref 3.5–5.0)
Anion gap: 11 (ref 5–15)
BUN: 63 mg/dL — ABNORMAL HIGH (ref 6–20)
CO2: 19 mmol/L — ABNORMAL LOW (ref 22–32)
Calcium: 8.3 mg/dL — ABNORMAL LOW (ref 8.9–10.3)
Chloride: 106 mmol/L (ref 98–111)
Creatinine, Ser: 9.83 mg/dL — ABNORMAL HIGH (ref 0.61–1.24)
GFR, Estimated: 6 mL/min — ABNORMAL LOW (ref >60)
Glucose, Bld: 272 mg/dL — ABNORMAL HIGH (ref 70–99)
Phosphorus: 3.9 mg/dL (ref 2.5–4.6)
Potassium: 4.2 mmol/L (ref 3.5–5.1)
Sodium: 136 mmol/L (ref 135–145)

## 2022-07-16 LAB — CBG MONITORING, ED
Glucose-Capillary: 262 mg/dL — ABNORMAL HIGH (ref 70–99)
Glucose-Capillary: 199 mg/dL — ABNORMAL HIGH (ref 70–99)

## 2022-07-16 LAB — HEPATITIS B SURFACE ANTIGEN: Hepatitis B Surface Ag: NONREACTIVE

## 2022-07-16 LAB — MRSA NEXT GEN BY PCR, NASAL: MRSA by PCR Next Gen: NOT DETECTED

## 2022-07-16 LAB — HIV ANTIBODY (ROUTINE TESTING W REFLEX): HIV Screen 4th Generation wRfx: NONREACTIVE

## 2022-07-16 MED ORDER — LIDOCAINE 5 % EX PTCH
1.0000 | MEDICATED_PATCH | CUTANEOUS | Status: DC
  Administered 2022-07-16 – 2022-07-22 (×6): 1 via TRANSDERMAL
  Filled 2022-07-16 (×6): qty 1

## 2022-07-16 MED ORDER — SODIUM CHLORIDE 0.9% FLUSH: 10.0000 mL | INTRAVENOUS | Status: DC | PRN

## 2022-07-16 MED ORDER — AMLODIPINE BESYLATE 10 MG PO TABS
10.0000 mg | ORAL_TABLET | Freq: Every day | ORAL | Status: DC
  Administered 2022-07-17 – 2022-07-21 (×5): 10 mg via ORAL
  Filled 2022-07-16 (×5): qty 1

## 2022-07-16 MED ORDER — HYDRALAZINE HCL 50 MG PO TABS
100.0000 mg | ORAL_TABLET | Freq: Three times a day (TID) | ORAL | Status: DC
  Administered 2022-07-17 – 2022-07-22 (×15): 100 mg via ORAL
  Filled 2022-07-16 (×15): qty 2

## 2022-07-16 MED ORDER — SODIUM CHLORIDE 0.9% FLUSH
10.0000 mL | Freq: Two times a day (BID) | INTRAVENOUS | Status: DC
  Administered 2022-07-17 – 2022-07-21 (×8): 10 mL
  Administered 2022-07-22: 20 mL

## 2022-07-16 MED ORDER — METHOCARBAMOL 500 MG PO TABS
500.0000 mg | ORAL_TABLET | Freq: Once | ORAL | Status: AC
  Administered 2022-07-17: 500 mg via ORAL
  Filled 2022-07-16 (×2): qty 1

## 2022-07-16 MED ORDER — SODIUM CHLORIDE 0.9 % IV SOLN: 2.0000 g | INTRAVENOUS | Status: DC

## 2022-07-16 MED ORDER — CHLORHEXIDINE GLUCONATE CLOTH 2 % EX PADS
6.0000 | MEDICATED_PAD | Freq: Every day | CUTANEOUS | Status: DC
  Administered 2022-07-17 – 2022-07-22 (×5): 6 via TOPICAL

## 2022-07-16 MED ORDER — SODIUM CHLORIDE 0.9 % IV SOLN
2.0000 g | Freq: Two times a day (BID) | INTRAVENOUS | Status: DC
  Administered 2022-07-17 – 2022-07-22 (×12): 2 g via INTRAVENOUS
  Filled 2022-07-16 (×13): qty 20

## 2022-07-16 MED ORDER — ACETAMINOPHEN 500 MG PO TABS: 1000.0000 mg | ORAL_TABLET | Freq: Four times a day (QID) | ORAL | Status: DC | PRN

## 2022-07-16 MED ORDER — MUSCLE RUB 10-15 % EX CREA
TOPICAL_CREAM | CUTANEOUS | Status: DC | PRN
  Administered 2022-07-19: 1 via TOPICAL
  Filled 2022-07-16: qty 85

## 2022-07-16 MED ORDER — SODIUM CHLORIDE 0.9 % IV SOLN
2.0000 g | Freq: Two times a day (BID) | INTRAVENOUS | Status: DC
  Administered 2022-07-16 – 2022-07-22 (×11): 2 g via INTRAVENOUS
  Filled 2022-07-16 (×14): qty 2000

## 2022-07-16 NOTE — Progress Notes (Signed)
    Discussed with the patient regarding TEE, risk versus benefit, he is fully oriented and has capacity to make decision independently, he states he does not wish TEE, he is not able to accept the potential risk/complications from the patient. Will cancel TEE, please call back if patient changes his mind.

## 2022-07-16 NOTE — Progress Notes (Signed)
Subjective:   Summary: Jeremy [name] is a 41 y.o. year old male currently admitted on the IMTS HD#1 for bacteremia 2/2 presumed central line infection.  Overnight Events: - NAEON - Continue continuing to have fevers chills, right-sided back pain -Declining multiple treatments this morning  Objective:  Vital signs in last 24 hours: Vitals:   07/16/22 0500 07/16/22 0539 07/16/22 0600 07/16/22 1131  BP: 132/68  (!) 159/90 (!) 144/74  Pulse:   88 89  Resp:   20 20  Temp:  97.6 F (36.4 C)  97.7 F (36.5 C)  TempSrc:  Oral  Oral  SpO2:   99% 97%  Weight:      Height:       Supplemental O2: Room Air SpO2: 97 %   Physical Exam:  Constitutional: NAD Cardiovascular: RRR, no murmurs, rubs or gallops Pulmonary/Chest: normal work of breathing on room air, lungs clear to auscultation bilaterally.  Indwelling left subclavian central venous catheter covered with dressings Skin: warm and dry Extremities: upper/lower extremity pulses 2+, no lower extremity edema present MSK: No point tenderness overlying spinous processes.  He does have tenderness to palpation of the right lumbar paraspinal muscles  Filed Weights   07/15/22 1321  Weight: 117.9 kg    No intake or output data in the 24 hours ending 07/16/22 1400 Net IO Since Admission: No IO data has been entered for this period [07/16/22 1400]  Pertinent Labs:    Latest Ref Rng & Units 07/16/2022    1:39 AM 07/15/2022    1:15 PM 08/12/2021   10:12 AM  CBC  WBC 4.0 - 10.5 K/uL 12.3  17.3    Hemoglobin 13.0 - 17.0 g/dL 8.8  10.6  11.9   Hematocrit 39.0 - 52.0 % 27.5  34.0  35.0   Platelets 150 - 400 K/uL 320  359         Latest Ref Rng & Units 07/16/2022    1:39 AM 07/15/2022    1:15 PM 08/12/2021   10:12 AM  CMP  Glucose 70 - 99 mg/dL 272  240  233   BUN 6 - 20 mg/dL 63  60  47   Creatinine 0.61 - 1.24 mg/dL 9.83  9.10  9.70   Sodium 135 - 145 mmol/L 136  138  140   Potassium 3.5 - 5.1 mmol/L 4.2  4.5   4.6   Chloride 98 - 111 mmol/L 106  103  100   CO2 22 - 32 mmol/L 19  19    Calcium 8.9 - 10.3 mg/dL 8.3  9.1    Total Protein 6.5 - 8.1 g/dL  7.7    Total Bilirubin 0.3 - 1.2 mg/dL  0.8    Alkaline Phos 38 - 126 U/L  55    AST 15 - 41 U/L  25    ALT 0 - 44 U/L  26     Iron 30, TIBC 123, ferritin 992 A1c 8.2 HIV nonreactive Urine culture pending Blood culture showing Enterococcus faecalis  Imaging: MR Lumbar Spine W Wo Contrast  Result Date: 07/15/2022 CLINICAL DATA:  Low back pain, infection suspected EXAM: MRI LUMBAR SPINE WITHOUT AND WITH CONTRAST TECHNIQUE: Multiplanar and multiecho pulse sequences of the lumbar spine were obtained without and with intravenous contrast. CONTRAST:  13mL GADAVIST GADOBUTROL 1 MMOL/ML IV SOLN COMPARISON:  No prior MRI, correlation is made with CT lumbar spine 07/15/2022  FINDINGS: Segmentation:  Standard. Alignment: Straightening of the normal lumbar lordosis. No listhesis. Vertebrae: No acute fracture or suspicious osseous lesion. Increased T2 signal and minimal contrast enhancement at the anterior endplates about L2-G4, without definite abnormal signal in the disc space. Congenitally short pedicles, which narrow the AP diameter of the spinal canal. Conus medullaris and cauda equina: Conus extends to the T12-L1 level. Conus and cauda equina appear normal. Paraspinal and other soft tissues: Negative. Disc levels: T12-L1: No significant disc bulge. Mild facet arthropathy. No spinal canal stenosis. No neural foraminal narrowing. L1-L2: No significant disc bulge. No spinal canal stenosis or neural foraminal narrowing. L2-L3: Minimal disc bulge. Mild facet arthropathy. No spinal canal stenosis. No neural foraminal narrowing. L3-L4: Minimal disc bulge. Mild facet arthropathy. Mild spinal canal stenosis. Narrowing of the lateral recesses. No neural foraminal narrowing. L4-L5: Mild disc bulge. Mild facet arthropathy. No spinal canal stenosis. Narrowing of the lateral  recesses. No neural foraminal narrowing. L5-S1: Minimal disc bulge. Mild facet arthropathy. No spinal canal stenosis. No neural foraminal narrowing. IMPRESSION: 1. L3-L4 mild spinal canal stenosis. Narrowing of the lateral recesses at this level could affect the descending L4 nerve roots. 2. Narrowing of the lateral recesses at L4-L5 could affect the descending L5 nerve roots. 3. No neural foraminal narrowing. 4. Edema in the anterior endplates about the W1-U2 disc space, without definite abnormal signal in the disc, favored to be degenerative, although early discitis osteomyelitis can appear similar. Correlate with lab values and consider short interval follow-up. Electronically Signed   By: Merilyn Baba M.D.   On: 07/15/2022 19:13   CT Angio Chest PE W and/or Wo Contrast  Result Date: 07/15/2022 CLINICAL DATA:  Pulmonary embolism suspected. 150 mL of Omnipaque 350. EXAM: CT ANGIOGRAPHY CHEST WITH CONTRAST TECHNIQUE: Multidetector CT imaging of the chest was performed using the standard protocol during bolus administration of intravenous contrast. Multiplanar CT image reconstructions and MIPs were obtained to evaluate the vascular anatomy. RADIATION DOSE REDUCTION: This exam was performed according to the departmental dose-optimization program which includes automated exposure control, adjustment of the mA and/or kV according to patient size and/or use of iterative reconstruction technique. CONTRAST:  156mL OMNIPAQUE IOHEXOL 350 MG/ML SOLN COMPARISON:  None Available. FINDINGS: Cardiovascular: Satisfactory opacification of the pulmonary arteries to the segmental level. No evidence of pulmonary embolism. Normal heart size. No pericardial effusion. Left IJ access double-lumen catheter with distal tip in the right atrium. Mediastinum/Nodes: No enlarged mediastinal, hilar, or axillary lymph nodes. Thyroid gland, trachea, and esophagus demonstrate no significant findings. Lungs/Pleura: Ground-glass attenuation of  the lung parenchyma prominent in the dependent portions of bilateral lower lobes concerning for airspace disease. Upper Abdomen: Cholelithiasis without evidence of acute cholecystitis. Musculoskeletal: Cortical irregularity of the left fifth rib posteriorly, likely a chronic fracture. Mild multilevel degenerate disc disease of the thoracic spine. Review of the MIP images confirms the above findings. IMPRESSION: 1. No evidence of pulmonary embolism. 2. Ground-glass attenuation of the lung parenchyma prominent in the dependent portions of bilateral lower lobes concerning for airspace disease. 3. Cholelithiasis without evidence of acute cholecystitis. 4. Cortical irregularity of the left fifth rib posteriorly, likely a chronic fracture. Correlate with localized pain. Electronically Signed   By: Keane Police D.O.   On: 07/15/2022 16:57   CT L-SPINE NO CHARGE  Result Date: 07/15/2022 CLINICAL DATA:  Cough, abdominal pain/back pain, right-sided leg pain. EXAM: CT LUMBAR SPINE WITHOUT CONTRAST TECHNIQUE: Multidetector CT imaging of the lumbar spine was performed without intravenous contrast administration.  Multiplanar CT image reconstructions were also generated. RADIATION DOSE REDUCTION: This exam was performed according to the departmental dose-optimization program which includes automated exposure control, adjustment of the mA and/or kV according to patient size and/or use of iterative reconstruction technique. COMPARISON:  Radiographs 07/09/2022 FINDINGS: Segmentation: The lowest lumbar type non-rib-bearing vertebra is labeled as L5. Alignment: No vertebral subluxation is observed. Vertebrae: Congenitally short pedicles in the lumbar spine. Schmorl's nodes and endplate sclerosis at Z8-5 with some vacuum disc phenomenon loss of intervertebral disc height at the L4-5 level. Similar findings were also present on the CT pelvis from/25/16. No lumbar spine fracture or acute bony findings. Paraspinal and other soft  tissues: No significant paraspinal edema. Otherwise please see dedicated CT abdomen report. Disc levels: T12-L1: Mild bilateral foraminal impingement due to short pedicles. L1-2: No impingement. L2-3: Mild bilateral foraminal stenosis and mild central narrowing of the thecal sac due to short pedicles and disc bulge. Faint calcification along the posterior annulus fibrosis of the intervertebral disc. L3-4: Moderate central narrowing of the thecal sac and mild to moderate bilateral foraminal stenosis due to disc bulge and short pedicles. L4-5: Moderate central narrowing of the thecal sac and mild bilateral foraminal stenosis due to disc bulge and short pedicles. Calcification along the posterior annulus fibrosis of the intervertebral disc. L5-S1: Mild to moderate bilateral foraminal stenosis and borderline central narrowing of the thecal sac due to short pedicles and degenerative facet arthropathy. Bilateral facet sclerosis is present, right greater than left. No impingement is identified along the sacral plexus. IMPRESSION: 1. Lumbar spondylosis, congenitally short pedicles, and degenerative disc disease, causing moderate impingement at L3-4 and L4-5; mild to moderate impingement at L5-S1; and mild impingement at T12-L1 and L2-3. Electronically Signed   By: Van Clines M.D.   On: 07/15/2022 16:50   CT ABDOMEN PELVIS W CONTRAST  Result Date: 07/15/2022 CLINICAL DATA:  Acute generalized abdominal pain, sepsis. EXAM: CT ABDOMEN AND PELVIS WITH CONTRAST TECHNIQUE: Multidetector CT imaging of the abdomen and pelvis was performed using the standard protocol following bolus administration of intravenous contrast. RADIATION DOSE REDUCTION: This exam was performed according to the departmental dose-optimization program which includes automated exposure control, adjustment of the mA and/or kV according to patient size and/or use of iterative reconstruction technique. CONTRAST:  130mL OMNIPAQUE IOHEXOL 350 MG/ML SOLN  COMPARISON:  Jan 31, 2015.  October 09, 2019. FINDINGS: Lower chest: Mild left basilar atelectasis or infiltrate is noted. Hepatobiliary: Mild cholelithiasis is noted. No biliary dilatation is noted. No focal hepatic abnormality is noted. Pancreas: Unremarkable. No pancreatic ductal dilatation or surrounding inflammatory changes. Spleen: Normal in size without focal abnormality. Adrenals/Urinary Tract: Right adrenal gland appears normal. 2.1 cm left adrenal nodule is noted with average Hounsfield measurement of 38. No hydronephrosis or renal obstruction is noted. 2.8 cm cyst seen in upper pole of left kidney. Two smaller low densities are noted in lower pole which may represent cysts, but renal ultrasound is recommended for confirmation. Urinary bladder is unremarkable. Stomach/Bowel: Stomach is within normal limits. Appendix appears normal. No evidence of bowel wall thickening, distention, or inflammatory changes. Vascular/Lymphatic: No significant vascular findings are present. No enlarged abdominal or pelvic lymph nodes. Reproductive: Prostate is unremarkable. Other: No abdominal wall hernia or abnormality. No abdominopelvic ascites. Musculoskeletal: No acute or significant osseous findings. IMPRESSION: Mild left basilar atelectasis or infiltrate is noted. Mild cholelithiasis without evidence of cholecystitis. 2.1 cm left adrenal nodule is noted. Follow-up CT scan or MRI in 12 months is recommended to  ensure stability and rule out neoplasm. 2.8 cm simple cyst is seen in upper pole of left kidney. Two smaller low densities are noted in the lower pole of left kidney which may represent cyst, but renal ultrasound is recommended for confirmation and to rule out neoplasm. Electronically Signed   By: Marijo Conception M.D.   On: 07/15/2022 16:50     EKG: EKG showing sinus tachycardia and left axis deviation  Assessment/Plan:  Patient Summary: Jeremy [name] is a 41 y.o. with a pertinent PMH of HTN, CHF, ESRD on  TTS HD, who presented with fevers and chills and admitted for bacteremia.    #E. Faecalis Bacteremia Patient still endorsing some fevers and chills.  He is afebrile with downtrending leukocytosis after starting antimicrobial treatment.  He saw ID today, who recommended TEE to rule out endocarditis, but patient declined.  Indwelling catheter will have to be removed after dialysis today and stay out for at least 3 days per nephrology before replacement.  Continue antimicrobial management and will reevaluate TEE tomorrow.   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  Will be replaced after up to 72 hours as above.  Needs long-term dialysis access reevaluated likely in the outpatient setting after this admission with vascular surgery. Plan:  - Continue [place]  -HD per nephrology, appreciate recs -Replace dialysis catheter after line holiday, reassess long-term access in the outpatient setting with vascular surgery   #Back pain/possible early osteomyelitis versus degenerative changes Is endorsing some lumbar paraspinal back pain which was thought to be sciatic in nature in the ED during visit approximately week ago.  Does not seem to have any point tenderness over the spine.  Is not having any red flag neurological symptoms.  MRI shows L4/L5 endplate FLAIR/T2 hyperintensity which is likely degenerative, but could be concerning for early discitis/osteomyelitis.  Will conservatively manage at this point but low threshold for reimaging if he develops worsening infectious symptoms, back pain, or new onset neurological symptoms. Plan:  - Acetaminophen 1000mg  Q6H PRN for back pain.     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 Marigene Ehlers. Last echo revealed an EF of 55-60% on 06/28/19, LV was normal function, no left ventricular hypertrophy seen, and no regional wall motion abnormalities. GDMT management unoptimized due to ESRD, and home medications are coreg 37.5. Does not currently appear volume overloaded. Plan:  - Continue Coreg 37.5.   #Tropinemia  Pt had elevated tropnins at 256, then repeat at 212. Low suspicion of ACS.   #Type 2 Diabetes Mellitus  Regimen slightly decreased at admission, will titrate Plan:  - Lantus 25U At bedtime  - Novolog 12U with meals  - CBG Checks   #Anemia of Chronic Disease #Hx of Iron Deficiency Anemia Labs c/w inflammation/CKD Plan:  - Monitor CBC  Diet: Renal IVF: None,None VTE: Heparin Code: Full PT/OT recs: Pending,. TOC recs:  Family Update:   Dispo: Anticipated discharge pending treatment of bacteremia, line exchange, infectious workup  Linus Galas, MD PGY-1 Internal Medicine Resident Please contact the on call pager after 5 pm and on weekends at [phone].

## 2022-07-16 NOTE — Consult Note (Signed)
[facility] Renal Consultation Note    Indication for Consultation:  Management of ESRD/hemodialysis, anemia, hypertension/volume, and secondary hyperparathyroidism.  HPI: Jeremy [name] is a 41 y.o. male with PMH including ESRD on dialysis, HTN, HLD, T2DM, back pain, who presented to the ED from his dialysis center. He had missed his past two treatments due to back pain (was seen in the ED earlier this week). About 30 minutes into HD yesterday, he began having chills, fever, tachycardia and anxiety. Treatment was terminated and he was sent to the ED. Pt had 3 days of subjective fever, cough, and nausea prior to this. CTA with opacities in lower lobes, concerning for pulmonary edema. UA unremarkable. WBC elevated at 17.3. Pt has had a TDC for over a year because he has had a cousin die from anesthesia complications and he is very scared to have any surgery himself. Blood cultures positive for enterococcus faecalis.  Infectious disease is on board and is recommending ampicillin, line holiday, and TEE.   Pt seen in the ED. Feeling better overall. Still having back pain and cough. Endorses decreased appetite and fatigue. No SOB, orthopnea, CP, palpitations, dizziness, abdominal pain, nausea, vomiting, diarrhea.   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 01/2015 Lexiscan MV: EF 28%, inferior, inferolateral, apical ischemia;  b. 01/2015 Cath: nl cors, PCWP 18 mmHg, CO 9.38 L/min, CI 3.53 L/min/m^2.   CHF (congestive heart failure) (HCC)    Depression    Situational   ESRD (end stage renal disease) ([place])    TTHS- [place]   Essential hypertension    Family history of adverse reaction to anesthesia    sister- "it was too much for her heart" died   GERD (gastroesophageal reflux disease)    Hyperlipidemia    Membranous  glomerulonephritis    Morbid obesity ([place])    Nonischemic cardiomyopathy ([place])    a. 01/2015 Echo: EF 20-25%, diff HK, Gr 2 DD, Triv AI, mildly dil LA and Ao root.   PONV (postoperative nausea and vomiting)    Seizures ([place]) 02/29/2020   "electralytes were out of wack."   Type II diabetes mellitus ([place])    a. 01/2015 HbA1c = 8.9.   Past Surgical History:  Procedure Laterality Date   A/V FISTULAGRAM N/A 08/12/2021   Procedure: A/V FISTULAGRAM;  Surgeon: Waynetta Sandy, MD;  Location: [facility];  Service: Cardiovascular;  Laterality: N/A;   AV FISTULA PLACEMENT Right 12/18/2020   Procedure: RIGHT ARM RADIOCEPHALIC  ARTERIOVENOUS (AV) FISTULA CREATION;  Surgeon: Waynetta Sandy, MD;  Location: [facility];  Service: Vascular;  Laterality: Right;   CARDIAC CATHETERIZATION N/A 02/01/2015   Procedure: Right/Left Heart Cath and Coronary Angiography;  Surgeon: Josue Hector, MD;  Location: [facility];  Service: Cardiovascular;  Laterality: N/A;   IR FLUORO GUIDE CV LINE RIGHT  10/11/2019   IR US GUIDE VASC ACCESS RIGHT  10/11/2019   THORACOTOMY Left 11/08/2013   Procedure: LEFT THORACOTOMY;  Surgeon: Ivin Poot, MD;  Location[facility];  Service: Thoracic;  Laterality: Left;   Family History  Problem Relation Age of Onset   Diabetes Mellitus II Mother        died @ 24.   Gastric cancer Mother  CAD Father        died @ 1.   Heart attack Father    Congestive Heart Failure Father    Diabetes Mellitus II Sister    CAD Sister        s/p PCI - age 64.   Social History:  reports that he has never smoked. He has never used smokeless tobacco. He reports current drug use. Drug: Marijuana. He reports that he does not drink alcohol.  ROS: As per HPI otherwise negative. Physical Exam: Vitals:   07/16/22 0430 07/16/22 0500 07/16/22 0539 07/16/22 0600  BP: (!) 158/83 132/68  (!) 159/90  Pulse: 94   88  Resp: 18   20  Temp:   97.6 F (36.4 C)   TempSrc:   Oral    SpO2: 99%   99%  Weight:      Height:         General: Well developed, well nourished, in no acute distress. Head: Normocephalic, atraumatic, sclera non-icteric, mucus membranes are moist. Lungs: Clear bilaterally to auscultation without wheezes, rales, or rhonchi. Breathing is unlabored. Heart: RRR with normal S1, S2. No murmurs, rubs, or gallops appreciated. Abdomen: Soft, non-tender, non-distended with normoactive bowel sounds. No rebound/guarding. No obvious abdominal masses. Musculoskeletal:  Strength and tone appear normal for age. Lower extremities: No edema or ischemic changes, no open wounds. Neuro: Alert and oriented X 3. Moves all extremities spontaneously. Psych:  Responds to questions appropriately with a normal affect. Dialysis Access: R IJ TDC, no bandage, no erythema/drainage  Allergies  Allergen Reactions   Acyclovir And Related Hives   Prior to Admission medications   Medication Sig Start Date End Date Taking? Authorizing Provider  acetaminophen (TYLENOL) 500 MG tablet Take 1,000 mg by mouth every 6 (six) hours as needed for moderate pain.   Yes [provider]  amLODipine (NORVASC) 10 MG tablet Take 1 tablet (10 mg total) by mouth daily. 01/10/22  Yes Axel Filler, MD  atorvastatin (LIPITOR) 80 MG tablet Take 1 tablet (80 mg total) by mouth daily. NEEDS FOLLOW UP APPOINTMENT FOR ANYMORE REFILLS Patient taking differently: Take 80 mg by mouth daily. 05/08/22  Yes Bensimhon, Shaune Pascal, MD  AURYXIA 1 GM 210 MG(Fe) tablet Take 420 mg by mouth 3 (three) times daily. 08/05/21  Yes [provider]  carvedilol (COREG) 25 MG tablet Take 1.5 tablets (37.5 mg total) by mouth 2 (two) times daily with a meal. Need to schedule an appointment for further refills 05/05/22  Yes [name], MD  cloNIDine (CATAPRES) 0.3 MG tablet Take 1 tablet (0.3 mg total) by mouth 2 (two) times daily. 04/10/21  Yes [name], MD  Doxercalciferol (HECTOROL IV) Inject  1 Dose into the vein Every Tuesday,Thursday,and Saturday with dialysis. 06/05/22 06/04/23 Yes [provider]  glipiZIDE (GLUCOTROL) 5 MG tablet Take 1 tablet (5 mg total) by mouth daily before breakfast. 03/07/22  Yes Amponsah, Charisse March, MD  hydrALAZINE (APRESOLINE) 100 MG tablet Take 1 tablet (100 mg total) by mouth 3 (three) times daily. 01/31/22  Yes Bensimhon, Shaune Pascal, MD  ibuprofen (ADVIL) 200 MG tablet Take 400 mg by mouth every 6 (six) hours as needed for moderate pain.   Yes [provider]  insulin aspart (NOVOLOG FLEXPEN) 100 UNIT/ML FlexPen Inject 15 Units into the skin 3 (three) times daily with meals. Patient taking differently: Inject 17 Units into the skin 3 (three) times daily with meals. 03/25/22 07/15/22 Yes Masters, Katie, DO  insulin  glargine (LANTUS) 100 unit/mL SOPN Inject 37 Units into the skin at bedtime. 10/07/21 07/15/22 Yes Gaylan Gerold, DO  lidocaine (LIDODERM) 5 % Place 1 patch onto the skin daily. Remove and Discard patch within 12 hours or as directed. 07/09/22  Yes Prosperi, Christian H, PA-C  methocarbamol (ROBAXIN) 500 MG tablet Take 1 tablet (500 mg total) by mouth 2 (two) times daily. 07/09/22  Yes Prosperi, Christian H, PA-C  multivitamin (RENA-VIT) TABS tablet Take 1 tablet by mouth daily.   Yes [provider]  nitroGLYCERIN (NITROSTAT) 0.4 MG SL tablet PLACE 1 TABLET UNDER THE TONGUE EVERY 5 MINUTES AS NEEDED FOR CHEST PAIN. Patient taking differently: Place 0.4 mg under the tongue every 5 (five) minutes as needed for chest pain. 06/28/19  Yes [name], MD  ondansetron (ZOFRAN-ODT) 4 MG disintegrating tablet Take 1 tablet (4 mg total) by mouth every 8 (eight) hours as needed for nausea or vomiting. 07/02/21  Yes Smoot, Leary Roca, PA-C  RENVELA 800 MG tablet Take 1,600 mg by mouth 3 (three) times daily with meals. 07/28/21  Yes [provider]  UNABLE TO FIND Inject 1 Dose into the vein Every Tuesday,Thursday,and Saturday with  dialysis. Med Name: VIT D injection/IV.   Yes [provider]  Accu-Chek Softclix Lancets lancets Use as directed to check blood sugar four times daily. 02/24/22   Rick Duff, MD  blood glucose meter kit and supplies KIT Dispense based on patient and insurance preference. Use up to four times daily as directed. (FOR ICD-9 250.00, 250.01). 07/21/18   Katherine Roan, MD  Blood Glucose Monitoring Suppl (ACCU-CHEK GUIDE) w/Device KIT Use to test blood sugar 02/24/22   Rick Duff, MD  Blood Pressure Monitoring (FORA P20 BLOOD PRESSURE CUFF) MISC Check your blood pressure everyday in the morning. 02/24/22   Rick Duff, MD  cloNIDine (CATAPRES) 0.3 MG tablet Take 1 tablet (0.3 mg total) by mouth 2 (two) times daily as directed 08/21/21     ferric citrate (AURYXIA) 1 GM 210 MG(Fe) tablet Take 2 tablets (420 mg total) by mouth as directed with meals & 2 with snacks Patient not taking: Reported on 07/15/2022 02/06/22     ferric citrate (AURYXIA) 1 GM 210 MG(Fe) tablet Take 2 tablets (420 mg total) by mouth 3 (three) times daily with meals, AND 2 tablets (420 mg total) 2 (two) times daily with snacks Patient not taking: Reported on 07/15/2022 03/19/22     glucose blood (ACCU-CHEK GUIDE) test strip USE TO CHECK BLOOD SUGAR 4 TIMES DAILY 02/24/22   Rick Duff, MD  Insulin Pen Needle (PEN NEEDLES) 31G X 5 MM MISC use four times daily with insulin 10/07/21 01/05/22  Gaylan Gerold, DO  Insulin Syringe-Needle U-100 (ULTICARE INSULIN SYRINGE) 30G X 1/2" 0.5 ML MISC USE 4 TIMES DAILY WITH INSULIN INJECTIONS. 10/07/21 10/07/22  Iona Beard, MD   Current Facility-Administered Medications  Medication Dose Route Frequency Provider Last Rate Last Admin   acetaminophen (TYLENOL) tablet 1,000 mg  1,000 mg Oral Q6H PRN Nooruddin, Marlene Lard, MD       amLODipine (NORVASC) tablet 10 mg  10 mg Oral Daily Farrel Gordon, DO       ampicillin (OMNIPEN) 2 g in sodium chloride 0.9 % 100 mL IVPB  2 g Intravenous  Q12H Nooruddin, Marlene Lard, MD   Stopped at 07/16/22 0552   carvedilol (COREG) tablet 37.5 mg  37.5 mg Oral BID WC Katsadouros, Vasilios, MD   37.5 mg at 07/15/22 2321   cloNIDine (CATAPRES)  tablet 0.3 mg  0.3 mg Oral BID Katsadouros, Vasilios, MD   0.3 mg at 07/15/22 2320   heparin injection 5,000 Units  5,000 Units Subcutaneous Q8H Katsadouros, Vasilios, MD   5,000 Units at 07/16/22 0543   hydrALAZINE (APRESOLINE) tablet 100 mg  100 mg Oral TID Farrel Gordon, DO       insulin aspart (novoLOG) injection 12 Units  12 Units Subcutaneous TID WC Katsadouros, Vasilios, MD       insulin glargine-yfgn (SEMGLEE) injection 25 Units  25 Units Subcutaneous QHS Katsadouros, Vasilios, MD   25 Units at 07/16/22 0141   sevelamer carbonate (RENVELA) tablet 1,600 mg  1,600 mg Oral TID WC Katsadouros, Vasilios, MD       Current Outpatient Medications  Medication Sig Dispense Refill   acetaminophen (TYLENOL) 500 MG tablet Take 1,000 mg by mouth every 6 (six) hours as needed for moderate pain.     amLODipine (NORVASC) 10 MG tablet Take 1 tablet (10 mg total) by mouth daily. 90 tablet 3   atorvastatin (LIPITOR) 80 MG tablet Take 1 tablet (80 mg total) by mouth daily. NEEDS FOLLOW UP APPOINTMENT FOR ANYMORE REFILLS (Patient taking differently: Take 80 mg by mouth daily.) 90 tablet 0   AURYXIA 1 GM 210 MG(Fe) tablet Take 420 mg by mouth 3 (three) times daily.     carvedilol (COREG) 25 MG tablet Take 1.5 tablets (37.5 mg total) by mouth 2 (two) times daily with a meal. Need to schedule an appointment for further refills 270 tablet 0   cloNIDine (CATAPRES) 0.3 MG tablet Take 1 tablet (0.3 mg total) by mouth 2 (two) times daily. 60 tablet 6   Doxercalciferol (HECTOROL IV) Inject 1 Dose into the vein Every Tuesday,Thursday,and Saturday with dialysis.     glipiZIDE (GLUCOTROL) 5 MG tablet Take 1 tablet (5 mg total) by mouth daily before breakfast. 90 tablet 1   hydrALAZINE (APRESOLINE) 100 MG tablet Take 1 tablet (100 mg total) by  mouth 3 (three) times daily. 180 tablet 5   ibuprofen (ADVIL) 200 MG tablet Take 400 mg by mouth every 6 (six) hours as needed for moderate pain.     insulin aspart (NOVOLOG FLEXPEN) 100 UNIT/ML FlexPen Inject 15 Units into the skin 3 (three) times daily with meals. (Patient taking differently: Inject 17 Units into the skin 3 (three) times daily with meals.) 39 mL 3   insulin glargine (LANTUS) 100 unit/mL SOPN Inject 37 Units into the skin at bedtime. 33.3 mL 0   lidocaine (LIDODERM) 5 % Place 1 patch onto the skin daily. Remove and Discard patch within 12 hours or as directed. 30 patch 0   methocarbamol (ROBAXIN) 500 MG tablet Take 1 tablet (500 mg total) by mouth 2 (two) times daily. 26 tablet 0   multivitamin (RENA-VIT) TABS tablet Take 1 tablet by mouth daily.     nitroGLYCERIN (NITROSTAT) 0.4 MG SL tablet PLACE 1 TABLET UNDER THE TONGUE EVERY 5 MINUTES AS NEEDED FOR CHEST PAIN. (Patient taking differently: Place 0.4 mg under the tongue every 5 (five) minutes as needed for chest pain.) 25 tablet 3   ondansetron (ZOFRAN-ODT) 4 MG disintegrating tablet Take 1 tablet (4 mg total) by mouth every 8 (eight) hours as needed for nausea or vomiting. 20 tablet 0   RENVELA 800 MG tablet Take 1,600 mg by mouth 3 (three) times daily with meals.     UNABLE TO FIND Inject 1 Dose into the vein Every Tuesday,Thursday,and Saturday with dialysis. Med Name: VIT D  injection/IV.     Accu-Chek Softclix Lancets lancets Use as directed to check blood sugar four times daily. 400 each 1   blood glucose meter kit and supplies KIT Dispense based on patient and insurance preference. Use up to four times daily as directed. (FOR ICD-9 250.00, 250.01). 1 each 0   Blood Glucose Monitoring Suppl (ACCU-CHEK GUIDE) w/Device KIT Use to test blood sugar 1 kit 0   Blood Pressure Monitoring (FORA P20 BLOOD PRESSURE CUFF) MISC Check your blood pressure everyday in the morning. 1 each 0   cloNIDine (CATAPRES) 0.3 MG tablet Take 1 tablet  (0.3 mg total) by mouth 2 (two) times daily as directed 180 tablet 4   ferric citrate (AURYXIA) 1 GM 210 MG(Fe) tablet Take 2 tablets (420 mg total) by mouth as directed with meals & 2 with snacks (Patient not taking: Reported on 07/15/2022) 300 tablet 11   ferric citrate (AURYXIA) 1 GM 210 MG(Fe) tablet Take 2 tablets (420 mg total) by mouth 3 (three) times daily with meals, AND 2 tablets (420 mg total) 2 (two) times daily with snacks (Patient not taking: Reported on 07/15/2022) 300 tablet 3   glucose blood (ACCU-CHEK GUIDE) test strip USE TO CHECK BLOOD SUGAR 4 TIMES DAILY 400 strip 4   Insulin Pen Needle (PEN NEEDLES) 31G X 5 MM MISC use four times daily with insulin 300 each 2   Insulin Syringe-Needle U-100 (ULTICARE INSULIN SYRINGE) 30G X 1/2" 0.5 ML MISC USE 4 TIMES DAILY WITH INSULIN INJECTIONS. 100 each 3   Labs: Basic Metabolic Panel: Recent Labs  Lab 07/15/22 1315 07/16/22 0139  NA 138 136  K 4.5 4.2  CL 103 106  CO2 19* 19*  GLUCOSE 240* 272*  BUN 60* 63*  CREATININE 9.10* 9.83*  CALCIUM 9.1 8.3*  PHOS  --  3.9   Liver Function Tests: Recent Labs  Lab 07/15/22 1315 07/16/22 0139  AST 25  --   ALT 26  --   ALKPHOS 55  --   BILITOT 0.8  --   PROT 7.7  --   ALBUMIN 2.8* 2.4*   No results for input(s): "LIPASE", "AMYLASE" in the last 168 hours. No results for input(s): "AMMONIA" in the last 168 hours. CBC: Recent Labs  Lab 07/15/22 1315 07/16/22 0139  WBC 17.3* 12.3*  NEUTROABS 15.9* 9.4*  HGB 10.6* 8.8*  HCT 34.0* 27.5*  MCV 86.3 86.5  PLT 359 320   Cardiac Enzymes: No results for input(s): "CKTOTAL", "CKMB", "CKMBINDEX", "TROPONINI" in the last 168 hours. CBG: Recent Labs  Lab 07/16/22 0108 07/16/22 0736  GLUCAP 262* 199*   Iron Studies:  Recent Labs    07/16/22 0425  IRON 30*  TIBC 123*  FERRITIN 992*   Studies/Results: MR Lumbar Spine W Wo Contrast  Result Date: 07/15/2022 CLINICAL DATA:  Low back pain, infection suspected EXAM: MRI LUMBAR  SPINE WITHOUT AND WITH CONTRAST TECHNIQUE: Multiplanar and multiecho pulse sequences of the lumbar spine were obtained without and with intravenous contrast. CONTRAST:  48m GADAVIST GADOBUTROL 1 MMOL/ML IV SOLN COMPARISON:  No prior MRI, correlation is made with CT lumbar spine 07/15/2022 FINDINGS: Segmentation:  Standard. Alignment: Straightening of the normal lumbar lordosis. No listhesis. Vertebrae: No acute fracture or suspicious osseous lesion. Increased T2 signal and minimal contrast enhancement at the anterior endplates about LN2-T5 without definite abnormal signal in the disc space. Congenitally short pedicles, which narrow the AP diameter of the spinal canal. Conus medullaris and cauda equina: Conus extends to the  T12-L1 level. Conus and cauda equina appear normal. Paraspinal and other soft tissues: Negative. Disc levels: T12-L1: No significant disc bulge. Mild facet arthropathy. No spinal canal stenosis. No neural foraminal narrowing. L1-L2: No significant disc bulge. No spinal canal stenosis or neural foraminal narrowing. L2-L3: Minimal disc bulge. Mild facet arthropathy. No spinal canal stenosis. No neural foraminal narrowing. L3-L4: Minimal disc bulge. Mild facet arthropathy. Mild spinal canal stenosis. Narrowing of the lateral recesses. No neural foraminal narrowing. L4-L5: Mild disc bulge. Mild facet arthropathy. No spinal canal stenosis. Narrowing of the lateral recesses. No neural foraminal narrowing. L5-S1: Minimal disc bulge. Mild facet arthropathy. No spinal canal stenosis. No neural foraminal narrowing. IMPRESSION: 1. L3-L4 mild spinal canal stenosis. Narrowing of the lateral recesses at this level could affect the descending L4 nerve roots. 2. Narrowing of the lateral recesses at L4-L5 could affect the descending L5 nerve roots. 3. No neural foraminal narrowing. 4. Edema in the anterior endplates about the Y6-T0 disc space, without definite abnormal signal in the disc, favored to be  degenerative, although early discitis osteomyelitis can appear similar. Correlate with lab values and consider short interval follow-up. Electronically Signed   By: Merilyn Baba M.D.   On: 07/15/2022 19:13   CT Angio Chest PE W and/or Wo Contrast  Result Date: 07/15/2022 CLINICAL DATA:  Pulmonary embolism suspected. 150 mL of Omnipaque 350. EXAM: CT ANGIOGRAPHY CHEST WITH CONTRAST TECHNIQUE: Multidetector CT imaging of the chest was performed using the standard protocol during bolus administration of intravenous contrast. Multiplanar CT image reconstructions and MIPs were obtained to evaluate the vascular anatomy. RADIATION DOSE REDUCTION: This exam was performed according to the departmental dose-optimization program which includes automated exposure control, adjustment of the mA and/or kV according to patient size and/or use of iterative reconstruction technique. CONTRAST:  135m OMNIPAQUE IOHEXOL 350 MG/ML SOLN COMPARISON:  None Available. FINDINGS: Cardiovascular: Satisfactory opacification of the pulmonary arteries to the segmental level. No evidence of pulmonary embolism. Normal heart size. No pericardial effusion. Left IJ access double-lumen catheter with distal tip in the right atrium. Mediastinum/Nodes: No enlarged mediastinal, hilar, or axillary lymph nodes. Thyroid gland, trachea, and esophagus demonstrate no significant findings. Lungs/Pleura: Ground-glass attenuation of the lung parenchyma prominent in the dependent portions of bilateral lower lobes concerning for airspace disease. Upper Abdomen: Cholelithiasis without evidence of acute cholecystitis. Musculoskeletal: Cortical irregularity of the left fifth rib posteriorly, likely a chronic fracture. Mild multilevel degenerate disc disease of the thoracic spine. Review of the MIP images confirms the above findings. IMPRESSION: 1. No evidence of pulmonary embolism. 2. Ground-glass attenuation of the lung parenchyma prominent in the dependent  portions of bilateral lower lobes concerning for airspace disease. 3. Cholelithiasis without evidence of acute cholecystitis. 4. Cortical irregularity of the left fifth rib posteriorly, likely a chronic fracture. Correlate with localized pain. Electronically Signed   By: IKeane PoliceD.O.   On: 07/15/2022 16:57   CT L-SPINE NO CHARGE  Result Date: 07/15/2022 CLINICAL DATA:  Cough, abdominal pain/back pain, right-sided leg pain. EXAM: CT LUMBAR SPINE WITHOUT CONTRAST TECHNIQUE: Multidetector CT imaging of the lumbar spine was performed without intravenous contrast administration. Multiplanar CT image reconstructions were also generated. RADIATION DOSE REDUCTION: This exam was performed according to the departmental dose-optimization program which includes automated exposure control, adjustment of the mA and/or kV according to patient size and/or use of iterative reconstruction technique. COMPARISON:  Radiographs 07/09/2022 FINDINGS: Segmentation: The lowest lumbar type non-rib-bearing vertebra is labeled as L5. Alignment: No vertebral subluxation is observed.  Vertebrae: Congenitally short pedicles in the lumbar spine. Schmorl's nodes and endplate sclerosis at F3-8 with some vacuum disc phenomenon loss of intervertebral disc height at the L4-5 level. Similar findings were also present on the CT pelvis from/25/16. No lumbar spine fracture or acute bony findings. Paraspinal and other soft tissues: No significant paraspinal edema. Otherwise please see dedicated CT abdomen report. Disc levels: T12-L1: Mild bilateral foraminal impingement due to short pedicles. L1-2: No impingement. L2-3: Mild bilateral foraminal stenosis and mild central narrowing of the thecal sac due to short pedicles and disc bulge. Faint calcification along the posterior annulus fibrosis of the intervertebral disc. L3-4: Moderate central narrowing of the thecal sac and mild to moderate bilateral foraminal stenosis due to disc bulge and short  pedicles. L4-5: Moderate central narrowing of the thecal sac and mild bilateral foraminal stenosis due to disc bulge and short pedicles. Calcification along the posterior annulus fibrosis of the intervertebral disc. L5-S1: Mild to moderate bilateral foraminal stenosis and borderline central narrowing of the thecal sac due to short pedicles and degenerative facet arthropathy. Bilateral facet sclerosis is present, right greater than left. No impingement is identified along the sacral plexus. IMPRESSION: 1. Lumbar spondylosis, congenitally short pedicles, and degenerative disc disease, causing moderate impingement at L3-4 and L4-5; mild to moderate impingement at L5-S1; and mild impingement at T12-L1 and L2-3. Electronically Signed   By: Van Clines M.D.   On: 07/15/2022 16:50   CT ABDOMEN PELVIS W CONTRAST  Result Date: 07/15/2022 CLINICAL DATA:  Acute generalized abdominal pain, sepsis. EXAM: CT ABDOMEN AND PELVIS WITH CONTRAST TECHNIQUE: Multidetector CT imaging of the abdomen and pelvis was performed using the standard protocol following bolus administration of intravenous contrast. RADIATION DOSE REDUCTION: This exam was performed according to the departmental dose-optimization program which includes automated exposure control, adjustment of the mA and/or kV according to patient size and/or use of iterative reconstruction technique. CONTRAST:  123m OMNIPAQUE IOHEXOL 350 MG/ML SOLN COMPARISON:  Jan 31, 2015.  October 09, 2019. FINDINGS: Lower chest: Mild left basilar atelectasis or infiltrate is noted. Hepatobiliary: Mild cholelithiasis is noted. No biliary dilatation is noted. No focal hepatic abnormality is noted. Pancreas: Unremarkable. No pancreatic ductal dilatation or surrounding inflammatory changes. Spleen: Normal in size without focal abnormality. Adrenals/Urinary Tract: Right adrenal gland appears normal. 2.1 cm left adrenal nodule is noted with average Hounsfield measurement of 38. No  hydronephrosis or renal obstruction is noted. 2.8 cm cyst seen in upper pole of left kidney. Two smaller low densities are noted in lower pole which may represent cysts, but renal ultrasound is recommended for confirmation. Urinary bladder is unremarkable. Stomach/Bowel: Stomach is within normal limits. Appendix appears normal. No evidence of bowel wall thickening, distention, or inflammatory changes. Vascular/Lymphatic: No significant vascular findings are present. No enlarged abdominal or pelvic lymph nodes. Reproductive: Prostate is unremarkable. Other: No abdominal wall hernia or abnormality. No abdominopelvic ascites. Musculoskeletal: No acute or significant osseous findings. IMPRESSION: Mild left basilar atelectasis or infiltrate is noted. Mild cholelithiasis without evidence of cholecystitis. 2.1 cm left adrenal nodule is noted. Follow-up CT scan or MRI in 12 months is recommended to ensure stability and rule out neoplasm. 2.8 cm simple cyst is seen in upper pole of left kidney. Two smaller low densities are noted in the lower pole of left kidney which may represent cyst, but renal ultrasound is recommended for confirmation and to rule out neoplasm. Electronically Signed   By: JMarijo ConceptionM.D.   On: 07/15/2022 16:50  DG Chest 2 View  Result Date: 07/15/2022 CLINICAL DATA:  Suspected sepsis.  Fever. EXAM: CHEST - 2 VIEW COMPARISON:  07/02/2021 FINDINGS: Central line placed from the left has its tip in the proximal right atrium. Heart size is normal. Mildly tortuous aorta. The lungs are clear. No effusions. Old left-sided rib fracture and mild curvature of the spine as seen previously. IMPRESSION: No active cardiopulmonary disease. Central line tip in the proximal right atrium. Electronically Signed   By: Nelson Chimes M.D.   On: 07/15/2022 13:53    Outpatient Dialysis Orders:  Center: [facility]  on TTS. 180NRe 4 hours BFR 400 DFR 800 EDW 124kg 2K 2.5Ca TDC  Heparin 5000 unit bolus and 4000 unit  bolus mid HD Mircera 64mg IV q 4 weeks Venofer 563mweekly Hectorol 1083mIV q HD  Assessment/Plan:  E faecalis bacteremia: Infectious disease on board, getting IV ampicillin. He has a TDC that likely needs to be removed for a line holiday. Will plan to dialyze today then remove catheter after HD  ESRD:  TTS schedule, missed two treatments then had abbreviated HD yesterday due to illness. Will have HD today prior to line holiday  Hypertension/volume: Some opacities in BL lower lobes on chest CT. Suspect volume related. BP elevated. Continue home meds and will titrate volume down with HD. He has lost weight and will need a new EDW  Anemia: Hgb 10.6 then 8.8. Last ESA dose given 06/26/22, will start aranesp here  Metabolic bone disease: Phos controlled, calcium at goal. Continue binders and hectorol.   Nutrition:  Albumin low, continue renal diet and protein supplements Back pain: seen in the ED for back pain this week. Work up per primary team T2DM: On insulin  SamAnice PaganiniA-C 07/16/2022, 10:07 AM  [place]: [phone]

## 2022-07-16 NOTE — Progress Notes (Signed)
  Echocardiogram 2D Echocardiogram has been performed.  [name] 07/16/2022, 2:27 PM

## 2022-07-16 NOTE — Consult Note (Addendum)
Jeremy [name] for Infectious Disease  I have seen and examined the patient. I have personally reviewed the clinical findings, laboratory findings, microbiological data and imaging studies. The assessment and treatment plan was discussed with the Resident. I agree with her/his recommendations except following additions/corrections.  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 Seen in the ED 11/1 for back pain and was managed conservatively. ROS + for weight loss.    AT ED, febrile, leukocytosis, LA 1.7  11/7 blood cx Blood cx 2/2 sets GPC in pair and chains. BCID as E faecalis Amp S    Lab/microbiologic data and imaging reviewed.   Exam - obese black male sitting up in bed and appears comfortable              HEENT - [place]/AT and mucosa moist              Respiratory - Normal effort on room air, normal breath sounds             CVS - regular rate and rhythm              Neuro - Awake, alert oriented, power 5/5 in all extremities and non focal exam              Extremities - no peripheral edema, no signs of septic peripheral joints              Skin - no rashes   A/P  High grade E faecalis bacteremia: sources could be rt IJ catheter, need to r/o endocarditis with TEE, Denies GU symptoms. will repeat blood cx from 11/9.  Will change abtx to IV ampicillin only pending TEE ( scheduled for 11/13) Fu TTE Monitor CBC and BMP  Monitor for worsening back pain, new neurological signs in which case low threshold to re-image given also concerns for early discitis/OM raised in MRI L spine  Discussed with nephrology, plan to removed HD catheter after HD today as patient has missed several HD.  Additional work up for left adrenal  nodule and left kidney cyst to r/o neoplasm per primary  Following  Jeremy Oz, MD Infectious Disease Physician [facility] [address] [place], [place] [address] Phone: [phone]  Fax: [phone]  Reason for Consult: E. Faecalis bacteremia Referring Provider: Gaylyn Rong MD  Patient Active Problem List   Diagnosis Date Noted   Fever 07/15/2022   Unspecified protein-calorie malnutrition ([place]) 10/27/2019   Anemia, unspecified 10/26/2019   Acute kidney failure, unspecified ([place]) 10/21/2019   Coagulation defect, unspecified ([place]) 10/21/2019   Personal history of COVID-19 10/21/2019   Secondary hyperparathyroidism of renal origin ([place]) 10/21/2019   COVID-19 virus infection    Mass of soft tissue of face 09/23/2018   Onychomycosis due to dermatophyte 03/29/2018   Anemia, iron deficiency 07/18/2016   Anemia in chronic kidney disease 07/18/2016   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, stage 5 ([place]) 05/29/2016   Suspected sleep apnea 05/24/2015   Chronic combined systolic and diastolic CHF (congestive heart failure) ([place]) 03/01/2015   Nonischemic cardiomyopathy ([place])    Chest pain 01/09/2015   Asthma    Hyperlipidemia 03/31/2012   Obesity, Class I, BMI 30-34.9 03/21/2011  Essential hypertension 03/21/2011   Gastroesophageal reflux disease 03/21/2011   Type 2 diabetes mellitus with diabetic nephropathy ([place]) 02/20/2011    HPI: Jeremy [name] is a 41 y.o. male with history of ESRD on HD and insulin dependent type 2 DM who presents to emergency department with fevers, night sweats, and low back pain.   Patient's HPI begins approximately 2 weeks ago when he started having drenching night sweats, as though someone "poured water" on him while sleeping. On approximately 07/06/22, he began having low back pain radiating to R flank. He thought it could be a kidney issue. He was evaluated in  ED for this problem and was treated for sciatica secondary to degenerative disc disease. His chief presenting complaint of fever began around three days ago. He says that he started experiencing "hot flashes" and chills, which is unusual for him. He had an episode of this while he was being dialyzed yesterday. Medical personnel at the dialysis center evaluated him and he was found to have temperature elevated to 99 F. They recommended he proceed to the hospital.  In ED the patient was found to have temperature to 101 F. He was hemodynamically stable. Respiratory panel, HIV screening were negative. Mild leukocytosis. Blood cultures positive for E. Faecalis. MRI concerning for degenerative disc disease versus early vertebral osteomyelitis. CXR without signs of pneumonia.  Medical history notable for ESRD on HD. He has been receiving dialysis continually for about a year. He has a tunneled central line in place. It has never caused him any problems.  Social history notable for no IVDU. Lives by himself in [place]. Manages a Education administrator.  Review of Systems: Review of Systems  Constitutional:  Positive for chills, diaphoresis, fever, malaise/fatigue and weight loss (310 > 265 July to now).  Respiratory:  Positive for cough.   Gastrointestinal:  Positive for abdominal pain and vomiting. Negative for blood in stool, diarrhea, melena and nausea.  Genitourinary:  Positive for flank pain. Negative for dysuria, frequency, hematuria and urgency.  Skin:  Negative for rash.     amLODipine  10 mg Oral Daily   carvedilol  37.5 mg Oral BID WC   cloNIDine  0.3 mg Oral BID   heparin  5,000 Units Subcutaneous Q8H   hydrALAZINE  100 mg Oral TID   insulin aspart  12 Units Subcutaneous TID WC   insulin glargine-yfgn  25 Units Subcutaneous QHS   sevelamer carbonate  1,600 mg Oral TID WC    Past Medical History:  Diagnosis Date   Abscess of left groin    Acute blood loss anemia 11/11/2013   Anemia  in chronic kidney disease (CKD)    Asthma    Boil of scrotum 11/21/2015   Chest pain    a. 01/2015 Lexiscan MV: EF 28%, inferior, inferolateral, apical ischemia;  b. 01/2015 Cath: nl cors, PCWP 18 mmHg, CO 9.38 L/min, CI 3.53 L/min/m^2.   CHF (congestive heart failure) (HCC)    Depression    Situational   ESRD (end stage renal disease) ([place])    TTHS- [place]   Essential hypertension    Family history of adverse reaction to anesthesia    sister- "it was too much for her heart" died   GERD (gastroesophageal reflux disease)    Hyperlipidemia    Membranous glomerulonephritis    Morbid obesity ([place])    Nonischemic cardiomyopathy ([place])    a. 01/2015 Echo: EF 20-25%, diff HK, Gr 2 DD, Triv AI, mildly dil LA and Ao root.  PONV (postoperative nausea and vomiting)    Seizures ([place]) 02/29/2020   "electralytes were out of wack."   Type II diabetes mellitus ([place])    a. 01/2015 HbA1c = 8.9.   Past Surgical History:  Procedure Laterality Date   A/V FISTULAGRAM N/A 08/12/2021   Procedure: A/V FISTULAGRAM;  Surgeon: Waynetta Sandy, MD;  Location: [facility];  Service: Cardiovascular;  Laterality: N/A;   AV FISTULA PLACEMENT Right 12/18/2020   Procedure: RIGHT ARM RADIOCEPHALIC  ARTERIOVENOUS (AV) FISTULA CREATION;  Surgeon: Waynetta Sandy, MD;  Location: [facility];  Service: Vascular;  Laterality: Right;   CARDIAC CATHETERIZATION N/A 02/01/2015   Procedure: Right/Left Heart Cath and Coronary Angiography;  Surgeon: Josue Hector, MD;  Location: [facility];  Service: Cardiovascular;  Laterality: N/A;   IR FLUORO GUIDE CV LINE RIGHT  10/11/2019   IR US GUIDE VASC ACCESS RIGHT  10/11/2019   THORACOTOMY Left 11/08/2013   Procedure: LEFT THORACOTOMY;  Surgeon: Ivin Poot, MD;  Location: [facility];  Service: Thoracic;  Laterality: Left;    Social History   Tobacco Use   Smoking status: Never   Smokeless tobacco: Never  Vaping Use   Vaping Use: Never used   Substance Use Topics   Alcohol use: No    Alcohol/week: 0.0 standard drinks of alcohol   Drug use: Yes    Types: Marijuana    Comment: last used 09/15/2020   Family History  Problem Relation Age of Onset   Diabetes Mellitus II Mother        died @ 54.   Gastric cancer Mother    CAD Father        died @ 28.   Heart attack Father    Congestive Heart Failure Father    Diabetes Mellitus II Sister    CAD Sister        s/p PCI - age 79.    Allergies  Allergen Reactions   Acyclovir And Related Hives    OBJECTIVE: Vitals:   07/16/22 0430 07/16/22 0500 07/16/22 0539 07/16/22 0600  BP: (!) 158/83 132/68  (!) 159/90  Pulse: 94   88  Resp: 18   20  Temp:   97.6 F (36.4 C)   TempSrc:   Oral   SpO2: 99%   99%  Weight:      Height:       Body mass index is 30.83 kg/m.  Physical Exam Constitutional:      Appearance: He is not toxic-appearing.  HENT:     Mouth/Throat:     Mouth: Mucous membranes are moist.     Pharynx: Oropharynx is clear. No oropharyngeal exudate or posterior oropharyngeal erythema.  Eyes:     Conjunctiva/sclera: Conjunctivae normal.  Cardiovascular:     Rate and Rhythm: Rhythm irregular.     Heart sounds: No murmur heard. Pulmonary:     Effort: Pulmonary effort is normal.     Breath sounds: Normal breath sounds.  Abdominal:     General: There is no distension.     Palpations: Abdomen is soft.     Tenderness: There is no abdominal tenderness.  Skin:    Findings: No rash.  Neurological:     General: No focal deficit present.     Mental Status: He is oriented to person, place, and time.  Psychiatric:        Mood and Affect: Mood normal.        Behavior: Behavior normal.  Lab:  WBC 12.3 HGB 8.8  HIV non-reactive Resp panel negative  Microbiology:  Results for orders placed or performed during the hospital encounter of 07/15/22  Culture, blood (Routine x 2)     Status: None (Preliminary result)   Collection Time: 07/15/22  1:06 PM    Specimen: BLOOD  Result Value Ref Range Status   Specimen Description BLOOD LEFT ANTECUBITAL  Final   Special Requests   Final    BOTTLES DRAWN AEROBIC AND ANAEROBIC Blood Culture results may not be optimal due to an inadequate volume of blood received in culture bottles   Culture  Setup Time   Final    GRAM POSITIVE COCCI IN PAIRS IN CHAINS IN BOTH AEROBIC AND ANAEROBIC BOTTLES CRITICAL RESULT CALLED TO, READ BACK BY AND VERIFIED WITH: Jeremy Shell RN 07/16/22 @ 0420 BY AB Performed at [facility], [place] [address], [place], [place] [address]    Culture GRAM POSITIVE COCCI IN PAIRS IN CHAINS  Final   Report Status PENDING  Incomplete  Blood Culture ID Panel (Reflexed)     Status: Abnormal   Collection Time: 07/15/22  1:06 PM  Result Value Ref Range Status   Enterococcus faecalis DETECTED (A) NOT DETECTED Final    Comment: CRITICAL RESULT CALLED TO, READ BACK BY AND VERIFIED WITH: K. MUNNETT RN 07/16/22 @ 0420 BY AB    Enterococcus Faecium NOT DETECTED NOT DETECTED Final   Listeria monocytogenes NOT DETECTED NOT DETECTED Final   Staphylococcus species NOT DETECTED NOT DETECTED Final   Staphylococcus aureus (BCID) NOT DETECTED NOT DETECTED Final   Staphylococcus epidermidis NOT DETECTED NOT DETECTED Final   Staphylococcus lugdunensis NOT DETECTED NOT DETECTED Final   Streptococcus species NOT DETECTED NOT DETECTED Final   Streptococcus agalactiae NOT DETECTED NOT DETECTED Final   Streptococcus pneumoniae NOT DETECTED NOT DETECTED Final   Streptococcus pyogenes NOT DETECTED NOT DETECTED Final   A.calcoaceticus-baumannii NOT DETECTED NOT DETECTED Final   Bacteroides fragilis NOT DETECTED NOT DETECTED Final   Enterobacterales NOT DETECTED NOT DETECTED Final   Enterobacter cloacae complex NOT DETECTED NOT DETECTED Final   Escherichia coli NOT DETECTED NOT DETECTED Final   Klebsiella aerogenes NOT DETECTED NOT DETECTED Final   Klebsiella oxytoca NOT DETECTED NOT DETECTED Final    Klebsiella pneumoniae NOT DETECTED NOT DETECTED Final   Proteus species NOT DETECTED NOT DETECTED Final   Salmonella species NOT DETECTED NOT DETECTED Final   Serratia marcescens NOT DETECTED NOT DETECTED Final   Haemophilus influenzae NOT DETECTED NOT DETECTED Final   Neisseria meningitidis NOT DETECTED NOT DETECTED Final   Pseudomonas aeruginosa NOT DETECTED NOT DETECTED Final   Stenotrophomonas maltophilia NOT DETECTED NOT DETECTED Final   Candida albicans NOT DETECTED NOT DETECTED Final   Candida auris NOT DETECTED NOT DETECTED Final   Candida glabrata NOT DETECTED NOT DETECTED Final   Candida krusei NOT DETECTED NOT DETECTED Final   Candida parapsilosis NOT DETECTED NOT DETECTED Final   Candida tropicalis NOT DETECTED NOT DETECTED Final   Cryptococcus neoformans/gattii NOT DETECTED NOT DETECTED Final   Vancomycin resistance NOT DETECTED NOT DETECTED Final    Comment: Performed at [facility], [place] [address], [place], [place] [address]  Culture, blood (Routine x 2)     Status: None (Preliminary result)   Collection Time: 07/15/22  3:00 PM   Specimen: BLOOD  Result Value Ref Range Status   Specimen Description BLOOD RIGHT ANTECUBITAL  Final   Special Requests   Final  BOTTLES DRAWN AEROBIC AND ANAEROBIC Blood Culture adequate volume   Culture  Setup Time   Final    GRAM POSITIVE COCCI IN PAIRS IN CHAINS IN BOTH AEROBIC AND ANAEROBIC BOTTLES CRITICAL VALUE NOTED.  VALUE IS CONSISTENT WITH PREVIOUSLY REPORTED AND CALLED VALUE. Performed at [facility], [place] [address], [place], [place] [address]    Culture PENDING  Incomplete   Report Status PENDING  Incomplete  Resp Panel by RT-PCR (Flu A&B, Covid) Anterior Nasal Swab     Status: None   Collection Time: 07/15/22  3:08 PM   Specimen: Anterior Nasal Swab  Result Value Ref Range Status   SARS Coronavirus 2 by RT PCR NEGATIVE NEGATIVE Final    Comment: (NOTE) SARS-CoV-2 target nucleic acids are NOT DETECTED.  The  SARS-CoV-2 RNA is generally detectable in upper respiratory specimens during the acute phase of infection. The lowest concentration of SARS-CoV-2 viral copies this assay can detect is 138 copies/mL. A negative result does not preclude SARS-Cov-2 infection and should not be used as the sole basis for treatment or other patient management decisions. A negative result may occur with  improper specimen collection/handling, submission of specimen other than nasopharyngeal swab, presence of viral mutation(s) within the areas targeted by this assay, and inadequate number of viral copies(<138 copies/mL). A negative result must be combined with clinical observations, patient history, and epidemiological information. The expected result is Negative.  Fact Sheet for Patients:  EntrepreneurPulse.com.au  Fact Sheet for Healthcare Providers:  IncredibleEmployment.be  This test is no t yet approved or cleared by the [place] FDA and  has been authorized for detection and/or diagnosis of SARS-CoV-2 by FDA under an Emergency Use Authorization (EUA). This EUA will remain  in effect (meaning this test can be used) for the duration of the COVID-19 declaration under Section 564(b)(1) of the Act, 21 U.S.C.section 360bbb-3(b)(1), unless the authorization is terminated  or revoked sooner.       Influenza A by PCR NEGATIVE NEGATIVE Final   Influenza B by PCR NEGATIVE NEGATIVE Final    Comment: (NOTE) The Xpert Xpress SARS-CoV-2/FLU/RSV plus assay is intended as an aid in the diagnosis of influenza from Nasopharyngeal swab specimens and should not be used as a sole basis for treatment. Nasal washings and aspirates are unacceptable for Xpert Xpress SARS-CoV-2/FLU/RSV testing.  Fact Sheet for Patients: EntrepreneurPulse.com.au  Fact Sheet for Healthcare Providers: IncredibleEmployment.be  This test is not yet approved or  cleared by the [place] FDA and has been authorized for detection and/or diagnosis of SARS-CoV-2 by FDA under an Emergency Use Authorization (EUA). This EUA will remain in effect (meaning this test can be used) for the duration of the COVID-19 declaration under Section 564(b)(1) of the Act, 21 U.S.C. section 360bbb-3(b)(1), unless the authorization is terminated or revoked.  Performed at [facility], [place] [address] [place],  [address]    Imaging:  MR Lumbar Spine W Wo Contrast  Result Date: 07/15/2022 CLINICAL DATA:  Low back pain, infection suspected EXAM: MRI LUMBAR SPINE WITHOUT AND WITH CONTRAST TECHNIQUE: Multiplanar and multiecho pulse sequences of the lumbar spine were obtained without and with intravenous contrast. CONTRAST:  70m GADAVIST GADOBUTROL 1 MMOL/ML IV SOLN COMPARISON:  No prior MRI, correlation is made with CT lumbar spine 07/15/2022 FINDINGS: Segmentation:  Standard. Alignment: Straightening of the normal lumbar lordosis. No listhesis. Vertebrae: No acute fracture or suspicious osseous lesion. Increased T2 signal and minimal contrast enhancement at the anterior endplates about LB1-D1  without definite abnormal signal in the disc space. Congenitally short pedicles, which narrow the AP diameter of the spinal canal. Conus medullaris and cauda equina: Conus extends to the T12-L1 level. Conus and cauda equina appear normal. Paraspinal and other soft tissues: Negative. Disc levels: T12-L1: No significant disc bulge. Mild facet arthropathy. No spinal canal stenosis. No neural foraminal narrowing. L1-L2: No significant disc bulge. No spinal canal stenosis or neural foraminal narrowing. L2-L3: Minimal disc bulge. Mild facet arthropathy. No spinal canal stenosis. No neural foraminal narrowing. L3-L4: Minimal disc bulge. Mild facet arthropathy. Mild spinal canal stenosis. Narrowing of the lateral recesses. No neural foraminal narrowing. L4-L5: Mild disc bulge. Mild facet  arthropathy. No spinal canal stenosis. Narrowing of the lateral recesses. No neural foraminal narrowing. L5-S1: Minimal disc bulge. Mild facet arthropathy. No spinal canal stenosis. No neural foraminal narrowing. IMPRESSION: 1. L3-L4 mild spinal canal stenosis. Narrowing of the lateral recesses at this level could affect the descending L4 nerve roots. 2. Narrowing of the lateral recesses at L4-L5 could affect the descending L5 nerve roots. 3. No neural foraminal narrowing. 4. Edema in the anterior endplates about the N5-A2 disc space, without definite abnormal signal in the disc, favored to be degenerative, although early discitis osteomyelitis can appear similar. Correlate with lab values and consider short interval follow-up. Electronically Signed   By: Merilyn Baba M.D.   On: 07/15/2022 19:13   CT Angio Chest PE W and/or Wo Contrast  Result Date: 07/15/2022 CLINICAL DATA:  Pulmonary embolism suspected. 150 mL of Omnipaque 350. EXAM: CT ANGIOGRAPHY CHEST WITH CONTRAST TECHNIQUE: Multidetector CT imaging of the chest was performed using the standard protocol during bolus administration of intravenous contrast. Multiplanar CT image reconstructions and MIPs were obtained to evaluate the vascular anatomy. RADIATION DOSE REDUCTION: This exam was performed according to the departmental dose-optimization program which includes automated exposure control, adjustment of the mA and/or kV according to patient size and/or use of iterative reconstruction technique. CONTRAST:  120m OMNIPAQUE IOHEXOL 350 MG/ML SOLN COMPARISON:  None Available. FINDINGS: Cardiovascular: Satisfactory opacification of the pulmonary arteries to the segmental level. No evidence of pulmonary embolism. Normal heart size. No pericardial effusion. Left IJ access double-lumen catheter with distal tip in the right atrium. Mediastinum/Nodes: No enlarged mediastinal, hilar, or axillary lymph nodes. Thyroid gland, trachea, and esophagus demonstrate no  significant findings. Lungs/Pleura: Ground-glass attenuation of the lung parenchyma prominent in the dependent portions of bilateral lower lobes concerning for airspace disease. Upper Abdomen: Cholelithiasis without evidence of acute cholecystitis. Musculoskeletal: Cortical irregularity of the left fifth rib posteriorly, likely a chronic fracture. Mild multilevel degenerate disc disease of the thoracic spine. Review of the MIP images confirms the above findings. IMPRESSION: 1. No evidence of pulmonary embolism. 2. Ground-glass attenuation of the lung parenchyma prominent in the dependent portions of bilateral lower lobes concerning for airspace disease. 3. Cholelithiasis without evidence of acute cholecystitis. 4. Cortical irregularity of the left fifth rib posteriorly, likely a chronic fracture. Correlate with localized pain. Electronically Signed   By: IKeane PoliceD.O.   On: 07/15/2022 16:57   CT L-SPINE NO CHARGE  Result Date: 07/15/2022 CLINICAL DATA:  Cough, abdominal pain/back pain, right-sided leg pain. EXAM: CT LUMBAR SPINE WITHOUT CONTRAST TECHNIQUE: Multidetector CT imaging of the lumbar spine was performed without intravenous contrast administration. Multiplanar CT image reconstructions were also generated. RADIATION DOSE REDUCTION: This exam was performed according to the departmental dose-optimization program which includes automated exposure control, adjustment of the mA and/or kV according to patient  size and/or use of iterative reconstruction technique. COMPARISON:  Radiographs 07/09/2022 FINDINGS: Segmentation: The lowest lumbar type non-rib-bearing vertebra is labeled as L5. Alignment: No vertebral subluxation is observed. Vertebrae: Congenitally short pedicles in the lumbar spine. Schmorl's nodes and endplate sclerosis at O2-7 with some vacuum disc phenomenon loss of intervertebral disc height at the L4-5 level. Similar findings were also present on the CT pelvis from/25/16. No lumbar spine  fracture or acute bony findings. Paraspinal and other soft tissues: No significant paraspinal edema. Otherwise please see dedicated CT abdomen report. Disc levels: T12-L1: Mild bilateral foraminal impingement due to short pedicles. L1-2: No impingement. L2-3: Mild bilateral foraminal stenosis and mild central narrowing of the thecal sac due to short pedicles and disc bulge. Faint calcification along the posterior annulus fibrosis of the intervertebral disc. L3-4: Moderate central narrowing of the thecal sac and mild to moderate bilateral foraminal stenosis due to disc bulge and short pedicles. L4-5: Moderate central narrowing of the thecal sac and mild bilateral foraminal stenosis due to disc bulge and short pedicles. Calcification along the posterior annulus fibrosis of the intervertebral disc. L5-S1: Mild to moderate bilateral foraminal stenosis and borderline central narrowing of the thecal sac due to short pedicles and degenerative facet arthropathy. Bilateral facet sclerosis is present, right greater than left. No impingement is identified along the sacral plexus. IMPRESSION: 1. Lumbar spondylosis, congenitally short pedicles, and degenerative disc disease, causing moderate impingement at L3-4 and L4-5; mild to moderate impingement at L5-S1; and mild impingement at T12-L1 and L2-3. Electronically Signed   By: Van Clines M.D.   On: 07/15/2022 16:50   CT ABDOMEN PELVIS W CONTRAST  Result Date: 07/15/2022 CLINICAL DATA:  Acute generalized abdominal pain, sepsis. EXAM: CT ABDOMEN AND PELVIS WITH CONTRAST TECHNIQUE: Multidetector CT imaging of the abdomen and pelvis was performed using the standard protocol following bolus administration of intravenous contrast. RADIATION DOSE REDUCTION: This exam was performed according to the departmental dose-optimization program which includes automated exposure control, adjustment of the mA and/or kV according to patient size and/or use of iterative reconstruction  technique. CONTRAST:  165m OMNIPAQUE IOHEXOL 350 MG/ML SOLN COMPARISON:  Jan 31, 2015.  October 09, 2019. FINDINGS: Lower chest: Mild left basilar atelectasis or infiltrate is noted. Hepatobiliary: Mild cholelithiasis is noted. No biliary dilatation is noted. No focal hepatic abnormality is noted. Pancreas: Unremarkable. No pancreatic ductal dilatation or surrounding inflammatory changes. Spleen: Normal in size without focal abnormality. Adrenals/Urinary Tract: Right adrenal gland appears normal. 2.1 cm left adrenal nodule is noted with average Hounsfield measurement of 38. No hydronephrosis or renal obstruction is noted. 2.8 cm cyst seen in upper pole of left kidney. Two smaller low densities are noted in lower pole which may represent cysts, but renal ultrasound is recommended for confirmation. Urinary bladder is unremarkable. Stomach/Bowel: Stomach is within normal limits. Appendix appears normal. No evidence of bowel wall thickening, distention, or inflammatory changes. Vascular/Lymphatic: No significant vascular findings are present. No enlarged abdominal or pelvic lymph nodes. Reproductive: Prostate is unremarkable. Other: No abdominal wall hernia or abnormality. No abdominopelvic ascites. Musculoskeletal: No acute or significant osseous findings. IMPRESSION: Mild left basilar atelectasis or infiltrate is noted. Mild cholelithiasis without evidence of cholecystitis. 2.1 cm left adrenal nodule is noted. Follow-up CT scan or MRI in 12 months is recommended to ensure stability and rule out neoplasm. 2.8 cm simple cyst is seen in upper pole of left kidney. Two smaller low densities are noted in the lower pole of left kidney which may represent  cyst, but renal ultrasound is recommended for confirmation and to rule out neoplasm. Electronically Signed   By: Marijo Conception M.D.   On: 07/15/2022 16:50   DG Chest 2 View  Result Date: 07/15/2022 CLINICAL DATA:  Suspected sepsis.  Fever. EXAM: CHEST - 2 VIEW  COMPARISON:  07/02/2021 FINDINGS: Central line placed from the left has its tip in the proximal right atrium. Heart size is normal. Mildly tortuous aorta. The lungs are clear. No effusions. Old left-sided rib fracture and mild curvature of the spine as seen previously. IMPRESSION: No active cardiopulmonary disease. Central line tip in the proximal right atrium. Electronically Signed   By: Nelson Chimes M.D.   On: 07/15/2022 13:53   DG Lumbar Spine Complete  Result Date: 07/09/2022 CLINICAL DATA:  Right-sided shooting leg pain EXAM: LUMBAR SPINE - COMPLETE 4+ VIEW COMPARISON:  None Available. FINDINGS: Straightening of the normal lumbar lordosis. Mild disc space loss in the lower lumbar spine with anterior osteophytes at L4 and L5. Vertebral body heights are preserved. There is mild-to-moderate lower lumbar spine predominant facet degenerative change. Moderate colonic stool burden. No dilated loops of small bowel are visualized. IMPRESSION: Mild lower lumbar spine predominant degenerative change. Electronically Signed   By: Marin Roberts M.D.   On: 07/09/2022 11:44    Assessment/plan: Aven Christen is a 41 y.o. 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 faecalis bacteremia.  E. faecalis bacteremia Patient is hemodynamically stable. No concern for sepsis at this time. Likely source is the central line. His lumbar spine disease may be early osteomyelitis secondary to septic emboli, but this is uncertain and there is no other evidence of septic emboli. May consider getting CRP and ESR to further stratify risk for osteomyelitis/discitis. High index of suspicion for endocarditis given clinical findings and duration of symptoms. Recommend source control via removal of the central line. TEE to look for evidence of endocarditis. Treat with IV ampicillin, which has good activity against E. faecalis. Repeat blood cultures  11/9.  Follow-up: No follow-ups on file.  Nani Gasser MD 07/16/2022, 9:07 AM Pager: [phone]

## 2022-07-16 NOTE — Progress Notes (Signed)
Pt's home meds sent home with wife.

## 2022-07-16 NOTE — ED Notes (Signed)
Pt is extremely rude and refuses all care.

## 2022-07-16 NOTE — ED Notes (Signed)
Pt again refuses to take any medications and states he is has already taken his medications today even after being told to hold off. I also asked the second visitor to please step out because the room was so crowded and loud I could barely hear the pt.

## 2022-07-16 NOTE — ED Notes (Signed)
Pt again states he will not take his medications because he already has taken all the meds he needs this morning. I tried again to explain to him the importance of the medication but he stated he is "not taking nothing."

## 2022-07-16 NOTE — Progress Notes (Signed)
Patient endorsed back pain to the nursing staff. Evaluated at bedside, resting in bed comfortably has some right sided lower back discomfort. The pain is located off of the midline, in the area of his latissmus dorsi and obliques. Not tender to palpation. With him being in bed for the past few days, likely MSK pain. Encouraged moving as much as possible. Limited with pain regimen and ESRD. Will give one time dose of robaxin to see if this helps relieve the pain. Would also recommend PT to help with daily movements.   Of note, patient also found to have left renal cysts and left adrenal nodule on CT a/p yesterday. Radiology recommending CT/MRI in 12 months to monitor left adrenal nodule and renal US to rule out malignancy. Will relay this to day team.

## 2022-07-16 NOTE — ED Notes (Signed)
Doctor is in with the pt speaking to him regarding the medications that need to be administered.

## 2022-07-17 ENCOUNTER — Inpatient Hospital Stay (HOSPITAL_COMMUNITY): Payer: Medicaid Other

## 2022-07-17 DIAGNOSIS — R5081 Fever presenting with conditions classified elsewhere: Secondary | ICD-10-CM | POA: Diagnosis not present

## 2022-07-17 DIAGNOSIS — I330 Acute and subacute infective endocarditis: Secondary | ICD-10-CM | POA: Insufficient documentation

## 2022-07-17 DIAGNOSIS — R7881 Bacteremia: Secondary | ICD-10-CM | POA: Diagnosis not present

## 2022-07-17 DIAGNOSIS — N250 Renal osteodystrophy: Secondary | ICD-10-CM | POA: Diagnosis not present

## 2022-07-17 DIAGNOSIS — Z992 Dependence on renal dialysis: Secondary | ICD-10-CM | POA: Diagnosis not present

## 2022-07-17 DIAGNOSIS — T80211D Bloodstream infection due to central venous catheter, subsequent encounter: Secondary | ICD-10-CM

## 2022-07-17 DIAGNOSIS — B952 Enterococcus as the cause of diseases classified elsewhere: Secondary | ICD-10-CM | POA: Diagnosis not present

## 2022-07-17 DIAGNOSIS — D631 Anemia in chronic kidney disease: Secondary | ICD-10-CM | POA: Diagnosis not present

## 2022-07-17 DIAGNOSIS — N281 Cyst of kidney, acquired: Secondary | ICD-10-CM | POA: Diagnosis not present

## 2022-07-17 DIAGNOSIS — R509 Fever, unspecified: Secondary | ICD-10-CM | POA: Diagnosis not present

## 2022-07-17 DIAGNOSIS — A498 Other bacterial infections of unspecified site: Secondary | ICD-10-CM | POA: Diagnosis not present

## 2022-07-17 DIAGNOSIS — I120 Hypertensive chronic kidney disease with stage 5 chronic kidney disease or end stage renal disease: Secondary | ICD-10-CM | POA: Diagnosis not present

## 2022-07-17 DIAGNOSIS — T80211A Bloodstream infection due to central venous catheter, initial encounter: Secondary | ICD-10-CM | POA: Diagnosis not present

## 2022-07-17 DIAGNOSIS — N186 End stage renal disease: Secondary | ICD-10-CM | POA: Diagnosis not present

## 2022-07-17 HISTORY — PX: IR REMOVAL TUN CV CATH W/O FL: IMG2289

## 2022-07-17 LAB — CBC
HCT: 29.2 % — ABNORMAL LOW (ref 39.0–52.0)
Hemoglobin: 9.2 g/dL — ABNORMAL LOW (ref 13.0–17.0)
MCH: 26.4 pg (ref 26.0–34.0)
MCHC: 31.5 g/dL (ref 30.0–36.0)
MCV: 83.9 fL (ref 80.0–100.0)
Platelets: 312 10*3/uL (ref 150–400)
RBC: 3.48 MIL/uL — ABNORMAL LOW (ref 4.22–5.81)
RDW: 15.8 % — ABNORMAL HIGH (ref 11.5–15.5)
WBC: 10.9 10*3/uL — ABNORMAL HIGH (ref 4.0–10.5)
nRBC: 0 % (ref 0.0–0.2)

## 2022-07-17 LAB — GLUCOSE, CAPILLARY
Glucose-Capillary: 144 mg/dL — ABNORMAL HIGH (ref 70–99)
Glucose-Capillary: 136 mg/dL — ABNORMAL HIGH (ref 70–99)
Glucose-Capillary: 235 mg/dL — ABNORMAL HIGH (ref 70–99)
Glucose-Capillary: 156 mg/dL — ABNORMAL HIGH (ref 70–99)
Glucose-Capillary: 104 mg/dL — ABNORMAL HIGH (ref 70–99)

## 2022-07-17 LAB — RENAL FUNCTION PANEL
Albumin: 2.5 g/dL — ABNORMAL LOW (ref 3.5–5.0)
Anion gap: 12 (ref 5–15)
BUN: 42 mg/dL — ABNORMAL HIGH (ref 6–20)
CO2: 23 mmol/L (ref 22–32)
Calcium: 8.6 mg/dL — ABNORMAL LOW (ref 8.9–10.3)
Chloride: 99 mmol/L (ref 98–111)
Creatinine, Ser: 7.41 mg/dL — ABNORMAL HIGH (ref 0.61–1.24)
GFR, Estimated: 9 mL/min — ABNORMAL LOW (ref >60)
Glucose, Bld: 144 mg/dL — ABNORMAL HIGH (ref 70–99)
Phosphorus: 3.5 mg/dL (ref 2.5–4.6)
Potassium: 3.4 mmol/L — ABNORMAL LOW (ref 3.5–5.1)
Sodium: 134 mmol/L — ABNORMAL LOW (ref 135–145)

## 2022-07-17 MED ORDER — METHOCARBAMOL 500 MG PO TABS
500.0000 mg | ORAL_TABLET | Freq: Once | ORAL | Status: AC
  Administered 2022-07-17: 500 mg via ORAL
  Filled 2022-07-17: qty 1

## 2022-07-17 MED ORDER — HEPARIN SODIUM (PORCINE) 1000 UNIT/ML IJ SOLN
INTRAMUSCULAR | Status: AC
  Administered 2022-07-17: 1000 [IU]
  Filled 2022-07-17: qty 5

## 2022-07-17 MED ORDER — LIDOCAINE HCL 1 % IJ SOLN
INTRAMUSCULAR | Status: AC
  Administered 2022-07-17: 10 mL via INTRADERMAL
  Filled 2022-07-17: qty 20

## 2022-07-17 MED ORDER — ONDANSETRON HCL 4 MG/2ML IJ SOLN
4.0000 mg | Freq: Three times a day (TID) | INTRAMUSCULAR | Status: DC | PRN
  Administered 2022-07-17 – 2022-07-21 (×3): 4 mg via INTRAVENOUS
  Filled 2022-07-17 (×3): qty 2

## 2022-07-17 MED ORDER — ONDANSETRON HCL 4 MG PO TABS
4.0000 mg | ORAL_TABLET | Freq: Once | ORAL | Status: AC | PRN
  Administered 2022-07-19: 4 mg via ORAL
  Filled 2022-07-17: qty 1

## 2022-07-17 MED ORDER — CEFAZOLIN SODIUM-DEXTROSE 2-4 GM/100ML-% IV SOLN: 2.0000 g | INTRAVENOUS | Status: DC

## 2022-07-17 MED ORDER — ACETAMINOPHEN 500 MG PO TABS
1000.0000 mg | ORAL_TABLET | Freq: Four times a day (QID) | ORAL | Status: DC
  Administered 2022-07-17 – 2022-07-22 (×20): 1000 mg via ORAL
  Filled 2022-07-17 (×20): qty 2

## 2022-07-17 MED ORDER — LIDOCAINE HCL 1 % IJ SOLN
10.0000 mL | Freq: Once | INTRAMUSCULAR | Status: AC
  Filled 2022-07-17: qty 10

## 2022-07-17 NOTE — H&P (Signed)
Chief Complaint: Patient was seen in consultation today for  Chief Complaint  Patient presents with   Blood Infection   Referring Physician(s): Dr. Posey Pronto  Supervising Physician: Mir, Sharen Heck  Patient Status: [facility] - In-pt  History of Present Illness: Stellan Vick is a 41 y.o. male with a medical history significant for DM2, non-ischemic cardiomyopathy, CHF, morbid obesity, seizures and ESRD on hemodialysis. He presented to the ED 07/15/22 with a 3-day history of intermittent fevers and night sweats. During his dialysis session on 07/15/22 he started to tremble and run a low fever. He was sent to the ED and found to have a temperature of 101. He had additional complaints of nausea, vomiting, non-productive cough and chest pain. He has a left IJ tunneled dialysis catheter placed by Vascular about a year ago.   Blood cultures were positive for E. Faecalis with an unclear source of infection. Due to the catheter being in over a year Interventional Radiology was asked to remove the catheter for a line holiday. The patient was seen in IR today and his HD catheter was removed. The patient will need new line placement this weekend.   Interventional Radiology has been asked to evaluate this patient for an image-guided tunneled dialysis catheter placement.   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 01/2015 Lexiscan MV: EF 28%, inferior, inferolateral, apical ischemia;  b. 01/2015 Cath: nl cors, PCWP 18 mmHg, CO 9.38 L/min, CI 3.53 L/min/m^2.   CHF (congestive heart failure) (HCC)    Depression    Situational   ESRD (end stage renal disease) ([place])    TTHS- [place]   Essential hypertension    Family history of adverse reaction to anesthesia    sister- "it was too much for her heart" died   GERD (gastroesophageal reflux  disease)    Hyperlipidemia    Membranous glomerulonephritis    Morbid obesity ([place])    Nonischemic cardiomyopathy ([place])    a. 01/2015 Echo: EF 20-25%, diff HK, Gr 2 DD, Triv AI, mildly dil LA and Ao root.   PONV (postoperative nausea and vomiting)    Seizures ([place]) 02/29/2020   "electralytes were out of wack."   Type II diabetes mellitus ([place])    a. 01/2015 HbA1c = 8.9.    Past Surgical History:  Procedure Laterality Date   A/V FISTULAGRAM N/A 08/12/2021   Procedure: A/V FISTULAGRAM;  Surgeon: Waynetta Sandy, MD;  Location: [place] [facility];  Service: Cardiovascular;  Laterality: N/A;   AV FISTULA PLACEMENT Right 12/18/2020   Procedure: RIGHT ARM RADIOCEPHALIC  ARTERIOVENOUS (AV) FISTULA CREATION;  Surgeon: Waynetta Sandy, MD;  Location: [place];  Service: Vascular;  Laterality: Right;   CARDIAC CATHETERIZATION N/A 02/01/2015   Procedure: Right/Left Heart Cath and Coronary Angiography;  Surgeon: Josue Hector, MD;  Location: [place] [facility];  Service: Cardiovascular;  Laterality: N/A;   IR FLUORO GUIDE CV LINE RIGHT  10/11/2019   IR US GUIDE VASC ACCESS RIGHT  10/11/2019   THORACOTOMY Left 11/08/2013   Procedure: LEFT THORACOTOMY;  Surgeon: Ivin Poot, MD;  Location: [place];  Service: Thoracic;  Laterality: Left;    Allergies: Acyclovir and related  Medications: Prior to Admission medications   Medication Sig Start Date End Date  Taking? Authorizing Provider  acetaminophen (TYLENOL) 500 MG tablet Take 1,000 mg by mouth every 6 (six) hours as needed for moderate pain.   Yes [provider]  amLODipine (NORVASC) 10 MG tablet Take 1 tablet (10 mg total) by mouth daily. 01/10/22  Yes Axel Filler, MD  atorvastatin (LIPITOR) 80 MG tablet Take 1 tablet (80 mg total) by mouth daily. NEEDS FOLLOW UP APPOINTMENT FOR ANYMORE REFILLS Patient taking differently: Take 80 mg by mouth daily. 05/08/22  Yes Bensimhon, Shaune Pascal, MD  AURYXIA 1 GM 210 MG(Fe) tablet  Take 420 mg by mouth 3 (three) times daily. 08/05/21  Yes [provider]  carvedilol (COREG) 25 MG tablet Take 1.5 tablets (37.5 mg total) by mouth 2 (two) times daily with a meal. Need to schedule an appointment for further refills 05/05/22  Yes [name], MD  cloNIDine (CATAPRES) 0.3 MG tablet Take 1 tablet (0.3 mg total) by mouth 2 (two) times daily. 04/10/21  Yes [name], MD  Doxercalciferol (HECTOROL IV) Inject 1 Dose into the vein Every Tuesday,Thursday,and Saturday with dialysis. 06/05/22 06/04/23 Yes [provider]  glipiZIDE (GLUCOTROL) 5 MG tablet Take 1 tablet (5 mg total) by mouth daily before breakfast. 03/07/22  Yes Amponsah, Charisse March, MD  hydrALAZINE (APRESOLINE) 100 MG tablet Take 1 tablet (100 mg total) by mouth 3 (three) times daily. 01/31/22  Yes Bensimhon, Shaune Pascal, MD  ibuprofen (ADVIL) 200 MG tablet Take 400 mg by mouth every 6 (six) hours as needed for moderate pain.   Yes [provider]  insulin aspart (NOVOLOG FLEXPEN) 100 UNIT/ML FlexPen Inject 15 Units into the skin 3 (three) times daily with meals. Patient taking differently: Inject 17 Units into the skin 3 (three) times daily with meals. 03/25/22 07/15/22 Yes Masters, Katie, DO  insulin glargine (LANTUS) 100 unit/mL SOPN Inject 37 Units into the skin at bedtime. 10/07/21 07/15/22 Yes Gaylan Gerold, DO  lidocaine (LIDODERM) 5 % Place 1 patch onto the skin daily. Remove and Discard patch within 12 hours or as directed. 07/09/22  Yes Prosperi, Christian H, PA-C  methocarbamol (ROBAXIN) 500 MG tablet Take 1 tablet (500 mg total) by mouth 2 (two) times daily. 07/09/22  Yes Prosperi, Christian H, PA-C  multivitamin (RENA-VIT) TABS tablet Take 1 tablet by mouth daily.   Yes [provider]  nitroGLYCERIN (NITROSTAT) 0.4 MG SL tablet PLACE 1 TABLET UNDER THE TONGUE EVERY 5 MINUTES AS NEEDED FOR CHEST PAIN. Patient taking differently: Place 0.4 mg under the tongue every 5 (five) minutes as  needed for chest pain. 06/28/19  Yes [name], MD  ondansetron (ZOFRAN-ODT) 4 MG disintegrating tablet Take 1 tablet (4 mg total) by mouth every 8 (eight) hours as needed for nausea or vomiting. 07/02/21  Yes Smoot, Leary Roca, PA-C  RENVELA 800 MG tablet Take 1,600 mg by mouth 3 (three) times daily with meals. 07/28/21  Yes [provider]  UNABLE TO FIND Inject 1 Dose into the vein Every Tuesday,Thursday,and Saturday with dialysis. Med Name: VIT D injection/IV.   Yes [provider]  Accu-Chek Softclix Lancets lancets Use as directed to check blood sugar four times daily. 02/24/22   Rick Duff, MD  blood glucose meter kit and supplies KIT Dispense based on patient and insurance preference. Use up to four times daily as directed. (FOR ICD-9 250.00, 250.01). 07/21/18   Katherine Roan, MD  Blood Glucose Monitoring Suppl (ACCU-CHEK GUIDE) w/Device KIT Use to test blood sugar 02/24/22  Rick Duff, MD  Blood Pressure Monitoring (FORA P20 BLOOD PRESSURE CUFF) MISC Check your blood pressure everyday in the morning. 02/24/22   Rick Duff, MD  cloNIDine (CATAPRES) 0.3 MG tablet Take 1 tablet (0.3 mg total) by mouth 2 (two) times daily as directed 08/21/21     ferric citrate (AURYXIA) 1 GM 210 MG(Fe) tablet Take 2 tablets (420 mg total) by mouth as directed with meals & 2 with snacks Patient not taking: Reported on 07/15/2022 02/06/22     ferric citrate (AURYXIA) 1 GM 210 MG(Fe) tablet Take 2 tablets (420 mg total) by mouth 3 (three) times daily with meals, AND 2 tablets (420 mg total) 2 (two) times daily with snacks Patient not taking: Reported on 07/15/2022 03/19/22     glucose blood (ACCU-CHEK GUIDE) test strip USE TO CHECK BLOOD SUGAR 4 TIMES DAILY 02/24/22   Rick Duff, MD  Insulin Pen Needle (PEN NEEDLES) 31G X 5 MM MISC use four times daily with insulin 10/07/21 01/05/22  Gaylan Gerold, DO  Insulin Syringe-Needle U-100 (ULTICARE INSULIN SYRINGE) 30G X 1/2" 0.5  ML MISC USE 4 TIMES DAILY WITH INSULIN INJECTIONS. 10/07/21 10/07/22  Iona Beard, MD     Family History  Problem Relation Age of Onset   Diabetes Mellitus II Mother        died @ 70.   Gastric cancer Mother    CAD Father        died @ 74.   Heart attack Father    Congestive Heart Failure Father    Diabetes Mellitus II Sister    CAD Sister        s/p PCI - age 53.    Social History   Socioeconomic History   Marital status: Single    Spouse name: Not on file   Number of children: Not on file   Years of education: Not on file   Highest education level: Some college, no degree  Occupational History   Not on file  Tobacco Use   Smoking status: Never   Smokeless tobacco: Never  Vaping Use   Vaping Use: Never used  Substance and Sexual Activity   Alcohol use: No    Alcohol/week: 0.0 standard drinks of alcohol   Drug use: Yes    Types: Marijuana    Comment: last used 09/15/2020   Sexual activity: Yes    Partners: Female  Other Topics Concern   Not on file  Social History Narrative   Lives in [place] by himself.  Currently in [facility].   Caffeine- none       Social Determinants of Health   Financial Resource Strain: Not on file  Food Insecurity: No Food Insecurity (07/16/2022)   Hunger Vital Sign    Worried About Running Out of Food in the Last Year: Never true    Ran Out of Food in the Last Year: Never true  Transportation Needs: No Transportation Needs (07/16/2022)   PRAPARE - Hydrologist (Medical): No    Lack of Transportation (Non-Medical): No  Physical Activity: Insufficiently Active (11/27/2021)   Exercise Vital Sign    Days of Exercise per Week: 2 days    Minutes of Exercise per Session: 30 min  Stress: Stress Concern Present (11/27/2021)   [place]    Feeling of Stress : To some extent  Social Connections: Not on file    Review of Systems: A 12 point ROS  discussed  and pertinent positives are indicated in the HPI above.  All other systems are negative.  Review of Systems  Constitutional:  Negative for appetite change and fatigue.  Respiratory:  Negative for cough and shortness of breath.   Cardiovascular:  Negative for chest pain and leg swelling.  Gastrointestinal:  Positive for nausea and vomiting. Negative for abdominal pain and diarrhea.  Neurological:  Negative for dizziness and headaches.    Vital Signs: BP (!) 172/76   Pulse 82   Temp 99 F (37.2 C)   Resp 14   Ht _0  (1.956 m)   Wt 260 lb (117.9 kg)   SpO2 99%   BMI 30.83 kg/m   Physical Exam Constitutional:      General: He is not in acute distress.    Appearance: He is not ill-appearing.  HENT:     Mouth/Throat:     Mouth: Mucous membranes are moist.     Pharynx: Oropharynx is clear.  Cardiovascular:     Rate and Rhythm: Normal rate and regular rhythm.     Pulses: Normal pulses.  Pulmonary:     Effort: Pulmonary effort is normal.  Musculoskeletal:     Right lower leg: No edema.     Left lower leg: No edema.  Skin:    General: Skin is warm and dry.  Neurological:     Mental Status: He is alert and oriented to person, place, and time.     Imaging: ECHOCARDIOGRAM COMPLETE  Result Date: 07/16/2022    ECHOCARDIOGRAM REPORT   Patient Name:   MARCELLUS PULLIAM Date of Exam: 07/16/2022 Medical Rec #:  [id number]       Height:       77.0 in Accession #:    [id number]      Weight:       260.0 lb Date of Birth:  [birth year]       BSA:          2.501 m Patient Age:    89 years        BP:           144/74 mmHg Patient Gender: M               HR:           90 bpm. Exam Location:  Inpatient Procedure: 2D Echo, Cardiac Doppler and Color Doppler Indications:    Bacteremia  History:        Patient has prior history of Echocardiogram examinations, most                 recent 06/28/2019. Cardiomyopathy, Signs/Symptoms:Chest Pain;                 Risk Factors:Hypertension, Diabetes  and Dyslipidemia. Kidney                 disorder. Dialysis catheter.  Sonographer:    Roseanna Rainbow RDCS Referring Phys: [id number] [place]  1. Left ventricular ejection fraction, by estimation, is 50 to 55%. The left ventricle has low normal function. The left ventricle has no regional wall motion abnormalities. There is mild concentric left ventricular hypertrophy. Left ventricular diastolic parameters are consistent with Grade II diastolic dysfunction (pseudonormalization).  2. Right ventricular systolic function is normal. The right ventricular size is normal. Tricuspid regurgitation signal is inadequate for assessing PA pressure.  3. The mitral valve is normal in structure. Trivial mitral valve regurgitation. No evidence of mitral stenosis.  4. Dialysis catheter tip in  right atrium.  5. The aortic valve is tricuspid. Aortic valve regurgitation is moderate. No aortic stenosis is present. Cannot fully rule out small vegetation on aortic valve. Suggest TEE to assess the aortic valve.  6. Aortic dilatation noted. There is mild dilatation of the ascending aorta and of the aortic root, measuring 40 mm.  7. The inferior vena cava is dilated in size with >50% respiratory variability, suggesting right atrial pressure of 8 mmHg. FINDINGS  Left Ventricle: Left ventricular ejection fraction, by estimation, is 50 to 55%. The left ventricle has low normal function. The left ventricle has no regional wall motion abnormalities. The left ventricular internal cavity size was normal in size. There is mild concentric left ventricular hypertrophy. Left ventricular diastolic parameters are consistent with Grade II diastolic dysfunction (pseudonormalization). Right Ventricle: The right ventricular size is normal. No increase in right ventricular wall thickness. Right ventricular systolic function is normal. Tricuspid regurgitation signal is inadequate for assessing PA pressure. Left Atrium: Left atrial size was normal in  size. Right Atrium: Dialysis catheter tip in right atrium. Right atrial size was normal in size. Pericardium: There is no evidence of pericardial effusion. Mitral Valve: The mitral valve is normal in structure. Trivial mitral valve regurgitation. No evidence of mitral valve stenosis. Tricuspid Valve: The tricuspid valve is normal in structure. Tricuspid valve regurgitation is not demonstrated. Aortic Valve: The aortic valve is tricuspid. Aortic valve regurgitation is moderate. Aortic regurgitation PHT measures 389 msec. No aortic stenosis is present. Pulmonic Valve: The pulmonic valve was normal in structure. Pulmonic valve regurgitation is not visualized. Aorta: Aortic dilatation noted. There is mild dilatation of the ascending aorta and of the aortic root, measuring 40 mm. Venous: The inferior vena cava is dilated in size with greater than 50% respiratory variability, suggesting right atrial pressure of 8 mmHg. IAS/Shunts: No atrial level shunt detected by color flow Doppler.  LEFT VENTRICLE PLAX 2D LVIDd:         5.60 cm LVIDs:         4.00 cm LV PW:         1.40 cm LV IVS:        1.20 cm LVOT diam:     2.80 cm LV SV:         145 LV SV Index:   58 LVOT Area:     6.16 cm  LV Volumes (MOD) LV vol d, MOD A2C: 198.0 ml LV vol d, MOD A4C: 206.5 ml LV vol s, MOD A2C: 84.2 ml LV vol s, MOD A4C: 91.5 ml LV SV MOD A2C:     113.8 ml LV SV MOD A4C:     206.5 ml LV SV MOD BP:      119.6 ml RIGHT VENTRICLE             IVC RV S prime:     14.00 cm/s  IVC diam: 2.40 cm TAPSE (M-mode): 2.0 cm LEFT ATRIUM             Index        RIGHT ATRIUM          Index LA diam:        4.00 cm 1.60 cm/m   RA Area:     7.04 cm LA Vol (A2C):   70.5 ml 28.19 ml/m  RA Volume:   10.90 ml 4.36 ml/m LA Vol (A4C):   74.2 ml 29.67 ml/m LA Biplane Vol: 79.8 ml 31.91 ml/m  AORTIC VALVE LVOT Vmax:  127.00 cm/s LVOT Vmean:  81.800 cm/s LVOT VTI:    0.236 m AI PHT:      389 msec  AORTA Ao Root diam: 3.90 cm Ao Asc diam:  3.80 cm MITRAL VALVE MV  Area (PHT): 4.43 cm    SHUNTS MV Decel Time: 171 msec    Systemic VTI:  0.24 m MV E velocity: 79.13 cm/s  Systemic Diam: 2.80 cm MV A velocity: 87.47 cm/s MV E/A ratio:  0.90 Dalton McleanMD Electronically signed by Franki Monte Signature Date/Time: 07/16/2022/5:14:52 PM    Final    MR Lumbar Spine W Wo Contrast  Result Date: 07/15/2022 CLINICAL DATA:  Low back pain, infection suspected EXAM: MRI LUMBAR SPINE WITHOUT AND WITH CONTRAST TECHNIQUE: Multiplanar and multiecho pulse sequences of the lumbar spine were obtained without and with intravenous contrast. CONTRAST:  5m GADAVIST GADOBUTROL 1 MMOL/ML IV SOLN COMPARISON:  No prior MRI, correlation is made with CT lumbar spine 07/15/2022 FINDINGS: Segmentation:  Standard. Alignment: Straightening of the normal lumbar lordosis. No listhesis. Vertebrae: No acute fracture or suspicious osseous lesion. Increased T2 signal and minimal contrast enhancement at the anterior endplates about LZ1-I9 without definite abnormal signal in the disc space. Congenitally short pedicles, which narrow the AP diameter of the spinal canal. Conus medullaris and cauda equina: Conus extends to the T12-L1 level. Conus and cauda equina appear normal. Paraspinal and other soft tissues: Negative. Disc levels: T12-L1: No significant disc bulge. Mild facet arthropathy. No spinal canal stenosis. No neural foraminal narrowing. L1-L2: No significant disc bulge. No spinal canal stenosis or neural foraminal narrowing. L2-L3: Minimal disc bulge. Mild facet arthropathy. No spinal canal stenosis. No neural foraminal narrowing. L3-L4: Minimal disc bulge. Mild facet arthropathy. Mild spinal canal stenosis. Narrowing of the lateral recesses. No neural foraminal narrowing. L4-L5: Mild disc bulge. Mild facet arthropathy. No spinal canal stenosis. Narrowing of the lateral recesses. No neural foraminal narrowing. L5-S1: Minimal disc bulge. Mild facet arthropathy. No spinal canal stenosis. No neural  foraminal narrowing. IMPRESSION: 1. L3-L4 mild spinal canal stenosis. Narrowing of the lateral recesses at this level could affect the descending L4 nerve roots. 2. Narrowing of the lateral recesses at L4-L5 could affect the descending L5 nerve roots. 3. No neural foraminal narrowing. 4. Edema in the anterior endplates about the LC7-E9disc space, without definite abnormal signal in the disc, favored to be degenerative, although early discitis osteomyelitis can appear similar. Correlate with lab values and consider short interval follow-up. Electronically Signed   By: AMerilyn BabaM.D.   On: 07/15/2022 19:13   CT Angio Chest PE W and/or Wo Contrast  Result Date: 07/15/2022 CLINICAL DATA:  Pulmonary embolism suspected. 150 mL of Omnipaque 350. EXAM: CT ANGIOGRAPHY CHEST WITH CONTRAST TECHNIQUE: Multidetector CT imaging of the chest was performed using the standard protocol during bolus administration of intravenous contrast. Multiplanar CT image reconstructions and MIPs were obtained to evaluate the vascular anatomy. RADIATION DOSE REDUCTION: This exam was performed according to the departmental dose-optimization program which includes automated exposure control, adjustment of the mA and/or kV according to patient size and/or use of iterative reconstruction technique. CONTRAST:  1571mOMNIPAQUE IOHEXOL 350 MG/ML SOLN COMPARISON:  None Available. FINDINGS: Cardiovascular: Satisfactory opacification of the pulmonary arteries to the segmental level. No evidence of pulmonary embolism. Normal heart size. No pericardial effusion. Left IJ access double-lumen catheter with distal tip in the right atrium. Mediastinum/Nodes: No enlarged mediastinal, hilar, or axillary lymph nodes. Thyroid gland, trachea, and esophagus demonstrate no significant findings. Lungs/Pleura:  Ground-glass attenuation of the lung parenchyma prominent in the dependent portions of bilateral lower lobes concerning for airspace disease. Upper Abdomen:  Cholelithiasis without evidence of acute cholecystitis. Musculoskeletal: Cortical irregularity of the left fifth rib posteriorly, likely a chronic fracture. Mild multilevel degenerate disc disease of the thoracic spine. Review of the MIP images confirms the above findings. IMPRESSION: 1. No evidence of pulmonary embolism. 2. Ground-glass attenuation of the lung parenchyma prominent in the dependent portions of bilateral lower lobes concerning for airspace disease. 3. Cholelithiasis without evidence of acute cholecystitis. 4. Cortical irregularity of the left fifth rib posteriorly, likely a chronic fracture. Correlate with localized pain. Electronically Signed   By: Keane Police D.O.   On: 07/15/2022 16:57   CT L-SPINE NO CHARGE  Result Date: 07/15/2022 CLINICAL DATA:  Cough, abdominal pain/back pain, right-sided leg pain. EXAM: CT LUMBAR SPINE WITHOUT CONTRAST TECHNIQUE: Multidetector CT imaging of the lumbar spine was performed without intravenous contrast administration. Multiplanar CT image reconstructions were also generated. RADIATION DOSE REDUCTION: This exam was performed according to the departmental dose-optimization program which includes automated exposure control, adjustment of the mA and/or kV according to patient size and/or use of iterative reconstruction technique. COMPARISON:  Radiographs 07/09/2022 FINDINGS: Segmentation: The lowest lumbar type non-rib-bearing vertebra is labeled as L5. Alignment: No vertebral subluxation is observed. Vertebrae: Congenitally short pedicles in the lumbar spine. Schmorl's nodes and endplate sclerosis at U0-2 with some vacuum disc phenomenon loss of intervertebral disc height at the L4-5 level. Similar findings were also present on the CT pelvis from/25/16. No lumbar spine fracture or acute bony findings. Paraspinal and other soft tissues: No significant paraspinal edema. Otherwise please see dedicated CT abdomen report. Disc levels: T12-L1: Mild bilateral  foraminal impingement due to short pedicles. L1-2: No impingement. L2-3: Mild bilateral foraminal stenosis and mild central narrowing of the thecal sac due to short pedicles and disc bulge. Faint calcification along the posterior annulus fibrosis of the intervertebral disc. L3-4: Moderate central narrowing of the thecal sac and mild to moderate bilateral foraminal stenosis due to disc bulge and short pedicles. L4-5: Moderate central narrowing of the thecal sac and mild bilateral foraminal stenosis due to disc bulge and short pedicles. Calcification along the posterior annulus fibrosis of the intervertebral disc. L5-S1: Mild to moderate bilateral foraminal stenosis and borderline central narrowing of the thecal sac due to short pedicles and degenerative facet arthropathy. Bilateral facet sclerosis is present, right greater than left. No impingement is identified along the sacral plexus. IMPRESSION: 1. Lumbar spondylosis, congenitally short pedicles, and degenerative disc disease, causing moderate impingement at L3-4 and L4-5; mild to moderate impingement at L5-S1; and mild impingement at T12-L1 and L2-3. Electronically Signed   By: Van Clines M.D.   On: 07/15/2022 16:50   CT ABDOMEN PELVIS W CONTRAST  Result Date: 07/15/2022 CLINICAL DATA:  Acute generalized abdominal pain, sepsis. EXAM: CT ABDOMEN AND PELVIS WITH CONTRAST TECHNIQUE: Multidetector CT imaging of the abdomen and pelvis was performed using the standard protocol following bolus administration of intravenous contrast. RADIATION DOSE REDUCTION: This exam was performed according to the departmental dose-optimization program which includes automated exposure control, adjustment of the mA and/or kV according to patient size and/or use of iterative reconstruction technique. CONTRAST:  180m OMNIPAQUE IOHEXOL 350 MG/ML SOLN COMPARISON:  Jan 31, 2015.  October 09, 2019. FINDINGS: Lower chest: Mild left basilar atelectasis or infiltrate is noted.  Hepatobiliary: Mild cholelithiasis is noted. No biliary dilatation is noted. No focal hepatic abnormality is noted. Pancreas:  Unremarkable. No pancreatic ductal dilatation or surrounding inflammatory changes. Spleen: Normal in size without focal abnormality. Adrenals/Urinary Tract: Right adrenal gland appears normal. 2.1 cm left adrenal nodule is noted with average Hounsfield measurement of 38. No hydronephrosis or renal obstruction is noted. 2.8 cm cyst seen in upper pole of left kidney. Two smaller low densities are noted in lower pole which may represent cysts, but renal ultrasound is recommended for confirmation. Urinary bladder is unremarkable. Stomach/Bowel: Stomach is within normal limits. Appendix appears normal. No evidence of bowel wall thickening, distention, or inflammatory changes. Vascular/Lymphatic: No significant vascular findings are present. No enlarged abdominal or pelvic lymph nodes. Reproductive: Prostate is unremarkable. Other: No abdominal wall hernia or abnormality. No abdominopelvic ascites. Musculoskeletal: No acute or significant osseous findings. IMPRESSION: Mild left basilar atelectasis or infiltrate is noted. Mild cholelithiasis without evidence of cholecystitis. 2.1 cm left adrenal nodule is noted. Follow-up CT scan or MRI in 12 months is recommended to ensure stability and rule out neoplasm. 2.8 cm simple cyst is seen in upper pole of left kidney. Two smaller low densities are noted in the lower pole of left kidney which may represent cyst, but renal ultrasound is recommended for confirmation and to rule out neoplasm. Electronically Signed   By: Marijo Conception M.D.   On: 07/15/2022 16:50   DG Chest 2 View  Result Date: 07/15/2022 CLINICAL DATA:  Suspected sepsis.  Fever. EXAM: CHEST - 2 VIEW COMPARISON:  07/02/2021 FINDINGS: Central line placed from the left has its tip in the proximal right atrium. Heart size is normal. Mildly tortuous aorta. The lungs are clear. No effusions.  Old left-sided rib fracture and mild curvature of the spine as seen previously. IMPRESSION: No active cardiopulmonary disease. Central line tip in the proximal right atrium. Electronically Signed   By: Nelson Chimes M.D.   On: 07/15/2022 13:53   DG Lumbar Spine Complete  Result Date: 07/09/2022 CLINICAL DATA:  Right-sided shooting leg pain EXAM: LUMBAR SPINE - COMPLETE 4+ VIEW COMPARISON:  None Available. FINDINGS: Straightening of the normal lumbar lordosis. Mild disc space loss in the lower lumbar spine with anterior osteophytes at L4 and L5. Vertebral body heights are preserved. There is mild-to-moderate lower lumbar spine predominant facet degenerative change. Moderate colonic stool burden. No dilated loops of small bowel are visualized. IMPRESSION: Mild lower lumbar spine predominant degenerative change. Electronically Signed   By: Marin Roberts M.D.   On: 07/09/2022 11:44    Labs:  CBC: Recent Labs    08/12/21 1012 07/15/22 1315 07/16/22 0139 07/17/22 0712  WBC  --  17.3* 12.3* 10.9*  HGB 11.9* 10.6* 8.8* 9.2*  HCT 35.0* 34.0* 27.5* 29.2*  PLT  --  359 320 312    COAGS: Recent Labs    07/15/22 1228 07/15/22 1315  INR  --  1.2  APTT 31  --     BMP: Recent Labs    08/12/21 1012 07/15/22 1315 07/16/22 0139 07/17/22 0712  NA 140 138 136 134*  K 4.6 4.5 4.2 3.4*  CL 100 103 106 99  CO2  --  19* 19* 23  GLUCOSE 233* 240* 272* 144*  BUN 47* 60* 63* 42*  CALCIUM  --  9.1 8.3* 8.6*  CREATININE 9.70* 9.10* 9.83* 7.41*  GFRNONAA  --  7* 6* 9*    LIVER FUNCTION TESTS: Recent Labs    07/15/22 1315 07/16/22 0139 07/17/22 0712  BILITOT 0.8  --   --   AST 25  --   --  ALT 26  --   --   ALKPHOS 55  --   --   PROT 7.7  --   --   ALBUMIN 2.8* 2.4* 2.5*    TUMOR MARKERS: No results for input(s): "AFPTM", "CEA", "CA199", "CHROMGRNA" in the last 8760 hours.  Assessment and Plan:  ESRD on hemodialysis; bacteremia with removal of tunneled dialysis catheter; new  placement requested after line holiday  Purnell Daigle, 41 year old male, is tentatively scheduled for Saturday, 07/19/22, for an image-guided tunneled dialysis catheter placement.   Risks and benefits discussed with the patient including, but not limited to bleeding, infection, vascular injury, pneumothorax which may require chest tube placement, air embolism or even death  All of the patient's questions were answered, patient is agreeable to proceed. He will be NPO at midnight Saturday morning.   Consent signed and in IR.   Thank you for this interesting consult.  I greatly enjoyed meeting Dominik Yordy and look forward to participating in their care.  A copy of this report was sent to the requesting provider on this date.  Electronically Signed: Soyla Dryer, AGACNP-BC [phone] 07/17/2022, 10:24 AM   I spent a total of 20 Minutes    in face to face in clinical consultation, greater than 50% of which was counseling/coordinating care for tunneled dialysis catheter placement.

## 2022-07-17 NOTE — Progress Notes (Addendum)
Subjective:   Summary: Jeremy [name] is a 41 y.o. year old male currently admitted on the IMTS HD#2 for bacteremia 2/2 presumed central line infection.  Overnight Events: - Paged overnight about back pain.  Scheduled Tylenol, Robaxin.  Lidocaine patch also available.  He reports some improvement in back pain today and is not having any red flag symptoms. - 1 episode of nausea and vomiting after dialysis this morning.  Nausea resolved after Zofran.  He states that he frequently has postdialysis nausea and vomiting - He denies any chest pain, dyspnea, fevers and chills, night sweats. - We discussed the importance of TEE and he is now amenable to this procedure.  Contacted cardiology regarding rescheduling   Objective:  Vital signs in last 24 hours: Vitals:   07/16/22 2330 07/17/22 0003 07/17/22 0136 07/17/22 0407  BP: (!) 176/74 (!) 171/75 (!) 173/97 (!) 160/73  Pulse: 97 95 66 88  Resp:  17 17 14   Temp:   98.6 F (37 C) 99 F (37.2 C)  TempSrc:      SpO2: 96% 95% 100% 100%  Weight:      Height:       Supplemental O2: Room Air SpO2: 100 %   Physical Exam:  Constitutional: NAD Cardiovascular: RRR, no murmurs, rubs or gallops Pulmonary/Chest: normal work of breathing on room air, lungs clear to auscultation bilaterally.  Catheter removed, prior site covered in dressings. Abdomen: Soft, nondistended, nontender Skin: warm and dry Extremities: upper/lower extremity pulses 2+, no lower extremity edema present   Filed Weights   07/15/22 1321  Weight: 117.9 kg     Intake/Output Summary (Last 24 hours) at 07/17/2022 0713 Last data filed at 07/17/2022 0300 Gross per 24 hour  Intake 226.91 ml  Output 1459 ml  Net -1232.09 ml   Net IO Since Admission: -1,232.09 mL [07/17/22 0713]  Pertinent Labs:    Latest Ref Rng & Units 07/16/2022    1:39 AM 07/15/2022    1:15 PM 08/12/2021   10:12 AM  CBC  WBC 4.0 - 10.5 K/uL 12.3  17.3    Hemoglobin 13.0 - 17.0  g/dL 8.8  10.6  11.9   Hematocrit 39.0 - 52.0 % 27.5  34.0  35.0   Platelets 150 - 400 K/uL 320  359         Latest Ref Rng & Units 07/16/2022    1:39 AM 07/15/2022    1:15 PM 08/12/2021   10:12 AM  CMP  Glucose 70 - 99 mg/dL 272  240  233   BUN 6 - 20 mg/dL 63  60  47   Creatinine 0.61 - 1.24 mg/dL 9.83  9.10  9.70   Sodium 135 - 145 mmol/L 136  138  140   Potassium 3.5 - 5.1 mmol/L 4.2  4.5  4.6   Chloride 98 - 111 mmol/L 106  103  100   CO2 22 - 32 mmol/L 19  19    Calcium 8.9 - 10.3 mg/dL 8.3  9.1    Total Protein 6.5 - 8.1 g/dL  7.7    Total Bilirubin 0.3 - 1.2 mg/dL  0.8    Alkaline Phos 38 - 126 U/L  55    AST 15 - 41 U/L  25    ALT 0 - 44 U/L  26    CBGs 136, 144, 199 Urine culture pending Initial blood culture showing Enterococcus  faecalis, repeat cultures after line removal pending  Imaging: ECHOCARDIOGRAM COMPLETE  Result Date: 07/16/2022    ECHOCARDIOGRAM REPORT   Patient Name:   Jeremy [name] Date of Exam: 07/16/2022 Medical Rec #:  [id number]       Height:       77.0 in Accession #:    [id number]      Weight:       260.0 lb Date of Birth:  [birth year]       BSA:          2.501 m Patient Age:    89 years        BP:           144/74 mmHg Patient Gender: M               HR:           90 bpm. Exam Location:  Inpatient Procedure: 2D Echo, Cardiac Doppler and Color Doppler Indications:    Bacteremia  History:        Patient has prior history of Echocardiogram examinations, most                 recent 06/28/2019. Cardiomyopathy, Signs/Symptoms:Chest Pain;                 Risk Factors:Hypertension, Diabetes and Dyslipidemia. Kidney                 disorder. Dialysis catheter.  Sonographer:    Roseanna Rainbow RDCS Referring Phys: [id number] [place]  1. Left ventricular ejection fraction, by estimation, is 50 to 55%. The left ventricle has low normal function. The left ventricle has no regional wall motion abnormalities. There is mild concentric left ventricular hypertrophy. Left  ventricular diastolic parameters are consistent with Grade II diastolic dysfunction (pseudonormalization).  2. Right ventricular systolic function is normal. The right ventricular size is normal. Tricuspid regurgitation signal is inadequate for assessing PA pressure.  3. The mitral valve is normal in structure. Trivial mitral valve regurgitation. No evidence of mitral stenosis.  4. Dialysis catheter tip in right atrium.  5. The aortic valve is tricuspid. Aortic valve regurgitation is moderate. No aortic stenosis is present. Cannot fully rule out small vegetation on aortic valve. Suggest TEE to assess the aortic valve.  6. Aortic dilatation noted. There is mild dilatation of the ascending aorta and of the aortic root, measuring 40 mm.  7. The inferior vena cava is dilated in size with >50% respiratory variability, suggesting right atrial pressure of 8 mmHg. FINDINGS  Left Ventricle: Left ventricular ejection fraction, by estimation, is 50 to 55%. The left ventricle has low normal function. The left ventricle has no regional wall motion abnormalities. The left ventricular internal cavity size was normal in size. There is mild concentric left ventricular hypertrophy. Left ventricular diastolic parameters are consistent with Grade II diastolic dysfunction (pseudonormalization). Right Ventricle: The right ventricular size is normal. No increase in right ventricular wall thickness. Right ventricular systolic function is normal. Tricuspid regurgitation signal is inadequate for assessing PA pressure. Left Atrium: Left atrial size was normal in size. Right Atrium: Dialysis catheter tip in right atrium. Right atrial size was normal in size. Pericardium: There is no evidence of pericardial effusion. Mitral Valve: The mitral valve is normal in structure. Trivial mitral valve regurgitation. No evidence of mitral valve stenosis. Tricuspid Valve: The tricuspid valve is normal in structure. Tricuspid valve regurgitation is not  demonstrated. Aortic Valve: The aortic valve is tricuspid. Aortic valve regurgitation  is moderate. Aortic regurgitation PHT measures 389 msec. No aortic stenosis is present. Pulmonic Valve: The pulmonic valve was normal in structure. Pulmonic valve regurgitation is not visualized. Aorta: Aortic dilatation noted. There is mild dilatation of the ascending aorta and of the aortic root, measuring 40 mm. Venous: The inferior vena cava is dilated in size with greater than 50% respiratory variability, suggesting right atrial pressure of 8 mmHg. IAS/Shunts: No atrial level shunt detected by color flow Doppler.  LEFT VENTRICLE PLAX 2D LVIDd:         5.60 cm LVIDs:         4.00 cm LV PW:         1.40 cm LV IVS:        1.20 cm LVOT diam:     2.80 cm LV SV:         145 LV SV Index:   58 LVOT Area:     6.16 cm  LV Volumes (MOD) LV vol d, MOD A2C: 198.0 ml LV vol d, MOD A4C: 206.5 ml LV vol s, MOD A2C: 84.2 ml LV vol s, MOD A4C: 91.5 ml LV SV MOD A2C:     113.8 ml LV SV MOD A4C:     206.5 ml LV SV MOD BP:      119.6 ml RIGHT VENTRICLE             IVC RV S prime:     14.00 cm/s  IVC diam: 2.40 cm TAPSE (M-mode): 2.0 cm LEFT ATRIUM             Index        RIGHT ATRIUM          Index LA diam:        4.00 cm 1.60 cm/m   RA Area:     7.04 cm LA Vol (A2C):   70.5 ml 28.19 ml/m  RA Volume:   10.90 ml 4.36 ml/m LA Vol (A4C):   74.2 ml 29.67 ml/m LA Biplane Vol: 79.8 ml 31.91 ml/m  AORTIC VALVE LVOT Vmax:   127.00 cm/s LVOT Vmean:  81.800 cm/s LVOT VTI:    0.236 m AI PHT:      389 msec  AORTA Ao Root diam: 3.90 cm Ao Asc diam:  3.80 cm MITRAL VALVE MV Area (PHT): 4.43 cm    SHUNTS MV Decel Time: 171 msec    Systemic VTI:  0.24 m MV E velocity: 79.13 cm/s  Systemic Diam: 2.80 cm MV A velocity: 87.47 cm/s MV E/A ratio:  0.90 Dalton McleanMD Electronically signed by Franki Monte Signature Date/Time: 07/16/2022/5:14:52 PM    Final      EKG:   Assessment/Plan:  Patient Summary: Jeremy [name] is a 41 y.o. with a pertinent  PMH of HTN, CHF, ESRD on TTS HD, who presented with fevers and chills and admitted for bacteremia.    #E. Faecalis Bacteremia He is no longer having any fevers or chills.  Line was removed after dialysis yesterday.  Repeat cultures collected following line removal.  Remains afebrile, hemodynamically stable.  Echo yesterday showed possible vegetation on aortic valve, recommendation for follow-up TEE.  He is now amenable to TEE.  ID added on ceftriaxone in addition to ampicillin for augmentation of coverage for possible endocarditis.   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  Catheter removed following dialysis.  He will have final day of up to 72 hours.  IR will replace catheter following this.  He will need follow-up with vascular surgery for long-term dialysis access. Plan:  - Continue [place] - Zofran as needed after dialysis to manage nausea and vomiting -HD per nephrology, appreciate recs -Replace dialysis catheter after line holiday, appreciate IR assistance.  Reassess long-term access in the outpatient setting with vascular surgery   #Back pain, suspect MSK d/t degenerative changes Back pain slightly better on Tylenol, Robaxin, lidocaine patch.  Appears to be MSK in nature.  He has no red flag symptoms.  Will not reimage at this time, but can consider if he develops any worsening or alarming symptoms.  Believe PT and mobility will also help his pain. 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  He is already on significant antihypertensive regimen, with blood pressures better managed after dialysis, will try to modify as appropriate. Plan:  - Continue Coreg 37.5 twice daily, clonidine 0.3 mg twice daily, hydralazine 100 mg 3 times daily, amlodipine 10 mg daily   #Elevated troponin, likely demand ischemia Pt had elevated tropnins at 256, then repeat at 212. Low suspicion of ACS.   #Type 2 Diabetes Mellitus  Fasting slightly elevated this morning, but insulin administration timings were off.  We will monitor today and then titrate. Plan:  - Lantus 25U At bedtime  - Novolog 12U with meals  - CBG Checks   #Anemia of Chronic Disease #Hx of Iron Deficiency Anemia Labs c/w inflammation/CKD Plan:  - Monitor CBC  Diet: Renal IVF: None,None VTE: Heparin Code: Full PT/OT recs: Pending,. TOC recs:  Family Update:   Dispo: Anticipated discharge pending treatment of bacteremia, line exchange, infectious workup  Linus Galas, MD PGY-1 Internal Medicine Resident Please contact the on call pager after 5 pm and on weekends at [phone].

## 2022-07-17 NOTE — Progress Notes (Signed)
Nephrology Follow-Up Consult note   Assessment/Recommendations: Jeremy [name] is a/an 41 y.o. male with a past medical history significant for ESRD, admitted for bacteremia.       Outpatient Dialysis Orders:  Center: [facility]  on TTS. 180NRe 4 hours BFR 400 DFR 800 EDW 124kg 2K 2.5Ca TDC  Heparin 5000 unit bolus and 4000 unit bolus mid HD Mircera 10mcg IV q 4 weeks Venofer 50mg  weekly Hectorol 53mcg IV q HD   Assessment/Plan:  E faecalis bacteremia: Infectious disease on board, getting IV ampicillin. TDC removed this morning and planning to replace on saturday  ESRD:  TTS schedule. Had HD on 11/8. HD cath now out and plan for HD next on Saturday after catheter replaced.  Hypertension/volume: Some opacities in BL lower lobes on chest CT. Suspect volume related. BP elevated. Continue home meds and will titrate volume down with HD. He has lost weight and will need a new EDW  Anemia: Hgb ~9. Last ESA dose given 06/26/22, will start aranesp here  Metabolic bone disease: Phos controlled, calcium at goal. Continue binders and hectorol.   Nutrition:  Albumin low, continue renal diet and protein supplements Back pain: seen in the ED for back pain this week. Work up per primary team T2DM: On insulin   Recommendations conveyed to primary service.    [facility] 07/17/2022 10:53 AM  ___________________________________________________________  CC: Bacteremia  Interval History/Subjective: Patient underwent TDC removal this morning.  No significant issues.  Otherwise feels well.  Tolerated dialysis yesterday with no issues   Medications:  Current Facility-Administered Medications  Medication Dose Route Frequency Provider Last Rate Last Admin   acetaminophen (TYLENOL) tablet 1,000 mg  1,000 mg Oral Q6H Nooruddin, Saad, MD   1,000 mg at 07/17/22 0627   amLODipine (NORVASC) tablet 10 mg  10 mg Oral Daily Farrel Gordon, DO       ampicillin (OMNIPEN) 2 g in  sodium chloride 0.9 % 100 mL IVPB  2 g Intravenous Q12H Nooruddin, Saad, MD 300 mL/hr at 07/17/22 0151 2 g at 07/17/22 0151   carvedilol (COREG) tablet 37.5 mg  37.5 mg Oral BID WC Katsadouros, Vasilios, MD   37.5 mg at 07/17/22 0817   cefTRIAXone (ROCEPHIN) 2 g in sodium chloride 0.9 % 100 mL IVPB  2 g Intravenous Q12H Einar Grad, RPH 200 mL/hr at 07/17/22 1048 2 g at 07/17/22 1048   Chlorhexidine Gluconate Cloth 2 % PADS 6 each  6 each Topical Q0600 Janalee Dane, PA-C   6 each at 07/17/22 3007   cloNIDine (CATAPRES) tablet 0.3 mg  0.3 mg Oral BID Katsadouros, Vasilios, MD   0.3 mg at 07/17/22 1041   heparin injection 5,000 Units  5,000 Units Subcutaneous Q8H Katsadouros, Vasilios, MD   5,000 Units at 07/17/22 6226   hydrALAZINE (APRESOLINE) tablet 100 mg  100 mg Oral TID Farrel Gordon, DO   100 mg at 07/17/22 0817   insulin aspart (novoLOG) injection 12 Units  12 Units Subcutaneous TID WC Katsadouros, Vasilios, MD   12 Units at 07/17/22 1040   insulin glargine-yfgn (SEMGLEE) injection 25 Units  25 Units Subcutaneous QHS Katsadouros, Vasilios, MD   25 Units at 07/17/22 0155   lidocaine (LIDODERM) 5 % 1 patch  1 patch Transdermal Q24H Linus Galas, MD   1 patch at 07/16/22 2021   Muscle Rub CREA   Topical PRN Katsadouros, Vasilios, MD       ondansetron (ZOFRAN) injection 4 mg  4 mg  Intravenous Q8H PRN Linus Galas, MD   4 mg at 07/17/22 0811   sevelamer carbonate (RENVELA) tablet 1,600 mg  1,600 mg Oral TID WC Katsadouros, Vasilios, MD   1,600 mg at 07/17/22 0817   sodium chloride flush (NS) 0.9 % injection 10-40 mL  10-40 mL Intracatheter Q12H Charise Killian, MD       sodium chloride flush (NS) 0.9 % injection 10-40 mL  10-40 mL Intracatheter PRN Charise Killian, MD          Review of Systems: 10 systems reviewed and negative except per interval history/subjective  Physical Exam: Vitals:   07/17/22 0815 07/17/22 0850  BP: (!) 172/76 (!) 172/76  Pulse: 82   Resp:     Temp:    SpO2: 99%    No intake/output data recorded.  Intake/Output Summary (Last 24 hours) at 07/17/2022 1053 Last data filed at 07/17/2022 0300 Gross per 24 hour  Intake 226.91 ml  Output 1459 ml  Net -1232.09 ml   Constitutional: well-appearing, no acute distress ENMT: ears and nose without scars or lesions, MMM CV: normal rate, no edema Respiratory: Bilateral chest rise, normal work of breathing Gastrointestinal: soft, non-tender, no palpable masses or hernias Skin: no visible lesions or rashes Psych: alert, judgement/insight appropriate, appropriate mood and affect   Test Results I personally reviewed new and old clinical labs and radiology tests Lab Results  Component Value Date   NA 134 (L) 07/17/2022   K 3.4 (L) 07/17/2022   CL 99 07/17/2022   CO2 23 07/17/2022   BUN 42 (H) 07/17/2022   CREATININE 7.41 (H) 07/17/2022   GFR 109.39 03/09/2015   CALCIUM 8.6 (L) 07/17/2022   ALBUMIN 2.5 (L) 07/17/2022   PHOS 3.5 07/17/2022    CBC Recent Labs  Lab 07/15/22 1315 07/16/22 0139 07/17/22 0712  WBC 17.3* 12.3* 10.9*  NEUTROABS 15.9* 9.4*  --   HGB 10.6* 8.8* 9.2*  HCT 34.0* 27.5* 29.2*  MCV 86.3 86.5 83.9  PLT 359 320 312

## 2022-07-17 NOTE — Progress Notes (Signed)
Pt just came back from dialysis asking for stronger pain medication, pt being rude and aggressive, pulled out his tele leads and refused them back on saying he wants pain medication, Doc on call paged and notified, said will come see pt. Obasogie-Asidi, Amedee Cerrone Efe

## 2022-07-17 NOTE — Progress Notes (Signed)
    Jeremy [name] has been requested to perform a transesophageal echocardiogram on Jeremy [name] for bacteremia.  After careful review of history and examination, the risks and benefits of transesophageal echocardiogram have been explained including risks of esophageal damage, perforation (1:10,000 risk), bleeding, pharyngeal hematoma as well as other potential complications associated with conscious sedation including aspiration, arrhythmia, respiratory failure and death. Alternatives to treatment were discussed, questions were answered. Patient is now willing to proceed with procedure (declined yesterday).  He states he declined because he does not like the anesthesia (it makes him sick on his stomach). Advised hm that we can give him Zofran prior to the anesthesia.  Procedure is scheduled for 07/18/2022 at 2:00pm with Dr. Phineas Inches. Will make NPO at midnight and place pre-procedural orders.  [name], PA-C 07/17/2022 3:34 PM

## 2022-07-17 NOTE — Progress Notes (Signed)
Subjective:   Paged to bedside as patient was agitated and refusing all medications. He states nothing he has been given has helped his back pain that's been going on for two weeks. He states he was not even able to tolerate dialysis because of it, however earlier when we saw pt in dialysis he was comfortable. Lidocaine patch has not worked for him, neither has tylenol PRN. He states the pain is now in the midline of his lumbar area and does not radiate.    Physical Exam:   Constitutional: Very uncomfortable male MSK: Lumbar area not tender to palpation, no erythema seen   Assessment and Plan:   So far, we have tried tylenol 1000mg  and lidocaine patches for his pain which have not seemed to work. Pain is likely due to disck degeneration noted on previous scans of L4-L5. He denies it worsening, but it is constantly there and clearly making him uncomfortable. Will try scheduling Tylenol and Robaxin 500mg  as this seems to be MSK pain, and re-evaluate.   Plan:  -Schedule Tylenol  - Robaxin 500mg  one time dose  - Can consider re-imaging if back pain persists in the AM, or if patient develops neurologic symptoms.

## 2022-07-17 NOTE — Progress Notes (Signed)
Received patient in bed to unit.  Alert and oriented.  Informed consent signed and in chart.   Treatment initiated: 2109 Treatment completed: 0054  Patient tolerated well.  Transported back to the room  Alert, without acute distress.  Hand-off given to patient's nurse.   Access used: catheter Access issues: none  Total UF removed: 1459 Medication(s) given: Rocephin Post HD VS:  Post HD weight:    [facility]

## 2022-07-17 NOTE — Evaluation (Signed)
Physical Therapy Evaluation Patient Details Name: Jeremy [name] MRN: [id number] DOB: 01/28/1981 Today's Date: 07/17/2022  History of Present Illness  41 year old presenting 07/15/22 with several days of fevers and night sweats, found to have E faecalis bacteremia likely secondary to central line associated infection. Missed several dialysis sessions due to back pain; MRI this admission showing L4-L5 disc edema, favored to be degenerative although possible early osteomyelitis;   PMH end-stage renal disease on HD with tunneled hemodialysis catheter, insulin-dependent type 2 diabetes, hypertension, hyperlipidemia, heart failure with recovered ejection fraction and diastolic dysfunction  Clinical Impression   Pt admitted secondary to problem above with deficits below. PTA patient was independent and without back pain. Currently unclear if infectious process or DJD. Pain not going down into leg during session and may be localizing to lumbar area. Patient felt having RW helped reduce his back pain.  Pt currently requires minguard assist with cues for proper use of RW.  Anticipate patient will benefit from PT to address problems listed below. Will continue to follow acutely to maximize functional mobility independence and safety.  May benefit from OPPT on discharge to further address back pain if remains an issue.         Recommendations for follow up therapy are one component of a multi-disciplinary discharge planning process, led by the attending physician.  Recommendations may be updated based on patient status, additional functional criteria and insurance authorization.  Follow Up Recommendations Outpatient PT (potentially, depending on pain management)      Assistance Recommended at Discharge None  Patient can return home with the following       Equipment Recommendations Rolling walker (2 wheels)  Recommendations for Other Services       Functional Status Assessment Patient has had a  recent decline in their functional status and demonstrates the ability to make significant improvements in function in a reasonable and predictable amount of time.     Precautions / Restrictions Precautions Precautions: None Restrictions Weight Bearing Restrictions: No      Mobility  Bed Mobility Overal bed mobility: Independent                  Transfers Overall transfer level: Needs assistance Equipment used: Rolling walker (2 wheels) Transfers: Sit to/from Stand Sit to Stand: Supervision           General transfer comment: vc for proper use of RW    Ambulation/Gait Ambulation/Gait assistance: Supervision Gait Distance (Feet): 170 Feet Assistive device: Rolling walker (2 wheels) Gait Pattern/deviations: Step-through pattern, Trunk flexed   Gait velocity interpretation: >2.62 ft/sec, indicative of community ambulatory   General Gait Details: vc for upright posture and safe use of RW; cues for incr support via bil UEs to offload spine and RLE (although RLE not painful during session)  Stairs            Wheelchair Mobility    Modified Rankin (Stroke Patients Only)       Balance Overall balance assessment: Independent                                           Pertinent Vitals/Pain Pain Assessment Pain Assessment: 0-10 Pain Score: 6  Pain Location: rt low back Pain Descriptors / Indicators: Pressure, Shooting Pain Intervention(s): Limited activity within patient's tolerance, Monitored during session, Premedicated before session    Home Living Family/patient expects to be discharged to::  Private residence   Available Help at Discharge: Family;Available PRN/intermittently             Home Equipment: None      Prior Function Prior Level of Function : Independent/Modified Independent                     Hand Dominance        Extremity/Trunk Assessment   Upper Extremity Assessment Upper Extremity  Assessment: Overall WFL for tasks assessed    Lower Extremity Assessment Lower Extremity Assessment: Overall WFL for tasks assessed    Cervical / Trunk Assessment Cervical / Trunk Assessment: Other exceptions Cervical / Trunk Exceptions: overweight  Communication   Communication: No difficulties  Cognition Arousal/Alertness: Awake/alert Behavior During Therapy: WFL for tasks assessed/performed Overall Cognitive Status: Within Functional Limits for tasks assessed                                          General Comments      Exercises     Assessment/Plan    PT Assessment Patient needs continued PT services  PT Problem List Decreased activity tolerance;Decreased mobility;Decreased knowledge of use of DME;Pain       PT Treatment Interventions DME instruction;Gait training;Stair training;Functional mobility training;Therapeutic activities;Therapeutic exercise;Patient/family education    PT Goals (Current goals can be found in the Care Plan section)  Acute Rehab PT Goals Patient Stated Goal: to decr back pain PT Goal Formulation: With patient Time For Goal Achievement: 07/31/22 Potential to Achieve Goals: Good    Frequency Min 2X/week     Co-evaluation               AM-PAC PT "6 Clicks" Mobility  Outcome Measure Help needed turning from your back to your side while in a flat bed without using bedrails?: None Help needed moving from lying on your back to sitting on the side of a flat bed without using bedrails?: None Help needed moving to and from a bed to a chair (including a wheelchair)?: A Little Help needed standing up from a chair using your arms (e.g., wheelchair or bedside chair)?: A Little Help needed to walk in hospital room?: A Little Help needed climbing 3-5 steps with a railing? : A Little 6 Click Score: 20    End of Session   Activity Tolerance: Patient tolerated treatment well Patient left: in bed;with call bell/phone within  reach;with family/visitor present Nurse Communication: Mobility status;Other (comment) (pt would like to walk again later) PT Visit Diagnosis: Pain Pain - Right/Left: Right Pain - part of body:  (low back)    Time: 0923-3007 PT Time Calculation (min) (ACUTE ONLY): 12 min   Charges:   PT Evaluation $PT Eval Low Complexity: [place], PT Acute Rehabilitation Services  Office [phone]   Rexanne Mano 07/17/2022, 12:47 PM

## 2022-07-17 NOTE — Progress Notes (Signed)
  Transition of Care [facility]) Screening Note   Patient Details  Name: Jeremy [name] Date of Birth: Nov 15, 1980   Transition of Care [facility]) CM/SW Contact:    Pollie Friar, RN Phone Number: 07/17/2022, 3:47 PM   Pt is from home with spouse. Recommendations are for outpatient therapy. Pt sleeping at time of CM visit. Will f/u with pt in am.  Transition of Care Department [facility]) has reviewed patient and no TOC needs have been identified at this time. We will continue to monitor patient advancement through interdisciplinary progression rounds. If new patient transition needs arise, please place a TOC consult.

## 2022-07-17 NOTE — Progress Notes (Addendum)
I have seen and examined the patient. I have personally reviewed the clinical findings, laboratory findings, microbiological data and imaging studies. The assessment and treatment plan was discussed with the Resident. I agree with her/his recommendations except following additions/corrections.  Afebrile Patient seen post HD, HD catheter has been removed  Back pain is better controlled. No spinal tenderness HD catheter site is bandaged C/D/I  Ceftriaxone added last evening due to TTE with ? AV vegetation.  Discussed with him regarding the reason for TEE and he is agreeable now.   Continue ampicillin and ceftriaxone as is Ordered 2 sets of blood cx after HD catheter removal  Cardiology notified to schedule for TEE  Fu pending microbiologic data Monitor CBC and BMP  Following   Jeremy Oz, MD Infectious Disease Physician [facility] [address] [place], [place] [address] Phone: [phone]  Fax: [phone]  Date of admission: 07/15/2022   Patient profile Jeremy [name] is a 41 y.o. male Jeremy [name] is a 41 y.o. male with ESRD on HD admitted for catheter associated E faecalis bacteremia and concern for endocarditis.  Principal Problem:   Fever Active Problems:   Bacteremia   Catheter-related bloodstream infection   Enterococcus faecalis infection   Assessment and plan  Principal Problem:   Fever Active Problems:   Bacteremia   Catheter-related bloodstream infection   Enterococcus faecalis infection  E faecalis bacteremia secondary to indwelling central line High index of suspicion for endocarditis Clinically stable. Culprit central line removed. TTE notable for possible vegetation on the aortic valve. Proceed with TEE for better characterization of the lesion in question. In the meantime, we will treat with double beta-lactam therapy for presumed endocarditis. Repeat blood cultures pending  collection. 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 Central line was removed this morning. Yesterday the patient declined to undergo TEE. Given findings of TTE, we discussed with this patient the importance of TEE to help better understand his risk for heart infection.  Scheduled Meds:  acetaminophen  1,000 mg Oral Q6H   amLODipine  10 mg Oral Daily   carvedilol  37.5 mg Oral BID WC   Chlorhexidine Gluconate Cloth  6 each Topical Q0600   cloNIDine  0.3 mg Oral BID   heparin  5,000 Units Subcutaneous Q8H   hydrALAZINE  100 mg Oral TID   insulin aspart  12 Units Subcutaneous TID WC   insulin glargine-yfgn  25 Units Subcutaneous QHS   lidocaine  1 patch Transdermal Q24H   sevelamer carbonate  1,600 mg Oral TID WC   sodium chloride flush  10-40 mL Intracatheter Q12H   Continuous Infusions:  ampicillin (OMNIPEN) IV 2 g (07/17/22 0151)   cefTRIAXone (ROCEPHIN)  IV Stopped (07/17/22 0133)   PRN Meds:.Muscle Rub, ondansetron (ZOFRAN) IV, sodium chloride flush  Anti-infectives (From admission, onward)    Start     Dose/Rate Route Frequency Ordered Stop   07/17/22 1200  vancomycin (VANCOCIN) IVPB 1000 mg/200 mL premix  Status:  Discontinued        1,000 mg 200 mL/hr over 60 Minutes Intravenous Every T-Th-Sa (Hemodialysis) 07/15/22 1618 07/16/22 0453   07/16/22 1930  cefTRIAXone (ROCEPHIN) 2 g in sodium chloride 0.9 % 100 mL IVPB        2 g 200 mL/hr over 30 Minutes Intravenous Every 12 hours  07/16/22 1831     07/16/22 1545  ceFEPIme (MAXIPIME) 1 g in sodium chloride 0.9 % 100 mL IVPB  Status:  Discontinued       See Hyperspace for full Linked Orders Report.   1 g 200 mL/hr over 30 Minutes Intravenous Every 24 hours 07/15/22 1538 07/16/22 0322   07/16/22 1000  cefTRIAXone (ROCEPHIN) 2 g in sodium chloride 0.9 % 100 mL IVPB   Status:  Discontinued        2 g 200 mL/hr over 30 Minutes Intravenous Every 24 hours 07/16/22 0322 07/16/22 0453   07/16/22 0500  ampicillin (OMNIPEN) 2 g in sodium chloride 0.9 % 100 mL IVPB        2 g 300 mL/hr over 20 Minutes Intravenous Every 12 hours 07/16/22 0453     07/15/22 1545  ceFEPIme (MAXIPIME) 1 g in sodium chloride 0.9 % 100 mL IVPB  Status:  Discontinued        1 g 200 mL/hr over 30 Minutes Intravenous Every 24 hours 07/15/22 1535 07/15/22 1538   07/15/22 1545  ceFEPIme (MAXIPIME) 2 g in sodium chloride 0.9 % 100 mL IVPB       See Hyperspace for full Linked Orders Report.   2 g 200 mL/hr over 30 Minutes Intravenous  Once 07/15/22 1538 07/15/22 1906   07/15/22 1545  vancomycin (VANCOCIN) 2,500 mg in sodium chloride 0.9 % 500 mL IVPB        2,500 mg 262.5 mL/hr over 120 Minutes Intravenous  Once 07/15/22 1540 07/15/22 2132   07/15/22 1515  ceFEPIme (MAXIPIME) 2 g in sodium chloride 0.9 % 100 mL IVPB  Status:  Discontinued        2 g 200 mL/hr over 30 Minutes Intravenous  Once 07/15/22 1507 07/15/22 1535   07/15/22 1515  metroNIDAZOLE (FLAGYL) IVPB 500 mg        500 mg 100 mL/hr over 60 Minutes Intravenous  Once 07/15/22 1507 07/15/22 1707   07/15/22 1515  vancomycin (VANCOCIN) IVPB 1000 mg/200 mL premix  Status:  Discontinued        1,000 mg 200 mL/hr over 60 Minutes Intravenous  Once 07/15/22 1507 07/15/22 1544       Physical exam Blood pressure (!) 172/76, pulse 82, temperature 99 F (37.2 C), resp. rate 14, height 6\' 5"  (1.956 m), weight 117.9 kg, SpO2 99 %.   General: No apparent acute distress. Respiratory: Breathing regular and unlabored. Abdominal: Soft and non-tender. Musculoskeletal: No spinal tenderness. Neurologic: No gross focal deficits.  Labs WBC 12.3 > 10.9  Microbiology Results for orders placed or performed during the hospital encounter of 07/15/22  Culture, blood (Routine x 2)     Status: Abnormal (Preliminary result)   Collection Time:  07/15/22  1:06 PM   Specimen: BLOOD  Result Value Ref Range Status   Specimen Description BLOOD LEFT ANTECUBITAL  Final   Special Requests   Final    BOTTLES DRAWN AEROBIC AND ANAEROBIC Blood Culture results may not be optimal due to an inadequate volume of blood received in culture bottles   Culture  Setup Time   Final    GRAM POSITIVE COCCI IN PAIRS IN CHAINS IN BOTH AEROBIC AND ANAEROBIC BOTTLES CRITICAL RESULT CALLED TO, READ BACK BY AND VERIFIED WITH: K. MUNNETT RN 07/16/22 @ 0420 BY AB    Culture (A)  Final    ENTEROCOCCUS FAECALIS SUSCEPTIBILITIES TO FOLLOW Performed at [facility], [place] [address], [place], [place] [address]  Report Status PENDING  Incomplete  Blood Culture ID Panel (Reflexed)     Status: Abnormal   Collection Time: 07/15/22  1:06 PM  Result Value Ref Range Status   Enterococcus faecalis DETECTED (A) NOT DETECTED Final    Comment: CRITICAL RESULT CALLED TO, READ BACK BY AND VERIFIED WITH: K. MUNNETT RN 07/16/22 @ 0420 BY AB    Enterococcus Faecium NOT DETECTED NOT DETECTED Final   Listeria monocytogenes NOT DETECTED NOT DETECTED Final   Staphylococcus species NOT DETECTED NOT DETECTED Final   Staphylococcus aureus (BCID) NOT DETECTED NOT DETECTED Final   Staphylococcus epidermidis NOT DETECTED NOT DETECTED Final   Staphylococcus lugdunensis NOT DETECTED NOT DETECTED Final   Streptococcus species NOT DETECTED NOT DETECTED Final   Streptococcus agalactiae NOT DETECTED NOT DETECTED Final   Streptococcus pneumoniae NOT DETECTED NOT DETECTED Final   Streptococcus pyogenes NOT DETECTED NOT DETECTED Final   A.calcoaceticus-baumannii NOT DETECTED NOT DETECTED Final   Bacteroides fragilis NOT DETECTED NOT DETECTED Final   Enterobacterales NOT DETECTED NOT DETECTED Final   Enterobacter cloacae complex NOT DETECTED NOT DETECTED Final   Escherichia coli NOT DETECTED NOT DETECTED Final   Klebsiella aerogenes NOT DETECTED NOT DETECTED Final   Klebsiella  oxytoca NOT DETECTED NOT DETECTED Final   Klebsiella pneumoniae NOT DETECTED NOT DETECTED Final   Proteus species NOT DETECTED NOT DETECTED Final   Salmonella species NOT DETECTED NOT DETECTED Final   Serratia marcescens NOT DETECTED NOT DETECTED Final   Haemophilus influenzae NOT DETECTED NOT DETECTED Final   Neisseria meningitidis NOT DETECTED NOT DETECTED Final   Pseudomonas aeruginosa NOT DETECTED NOT DETECTED Final   Stenotrophomonas maltophilia NOT DETECTED NOT DETECTED Final   Candida albicans NOT DETECTED NOT DETECTED Final   Candida auris NOT DETECTED NOT DETECTED Final   Candida glabrata NOT DETECTED NOT DETECTED Final   Candida krusei NOT DETECTED NOT DETECTED Final   Candida parapsilosis NOT DETECTED NOT DETECTED Final   Candida tropicalis NOT DETECTED NOT DETECTED Final   Cryptococcus neoformans/gattii NOT DETECTED NOT DETECTED Final   Vancomycin resistance NOT DETECTED NOT DETECTED Final    Comment: Performed at [facility], [address] [place], [place] [address]  Culture, blood (Routine x 2)     Status: Abnormal (Preliminary result)   Collection Time: 07/15/22  3:00 PM   Specimen: BLOOD  Result Value Ref Range Status   Specimen Description BLOOD RIGHT ANTECUBITAL  Final   Special Requests   Final    BOTTLES DRAWN AEROBIC AND ANAEROBIC Blood Culture adequate volume   Culture  Setup Time   Final    GRAM POSITIVE COCCI IN PAIRS IN CHAINS IN BOTH AEROBIC AND ANAEROBIC BOTTLES CRITICAL VALUE NOTED.  VALUE IS CONSISTENT WITH PREVIOUSLY REPORTED AND CALLED VALUE. Performed at [facility], [place] [address] [place],  [address]    Culture ENTEROCOCCUS FAECALIS (A)  Final   Report Status PENDING  Incomplete  Resp Panel by RT-PCR (Flu A&B, Covid) Anterior Nasal Swab     Status: None   Collection Time: 07/15/22  3:08 PM   Specimen: Anterior Nasal Swab  Result Value Ref Range Status   SARS Coronavirus 2 by RT PCR NEGATIVE NEGATIVE Final    Comment:  (NOTE) SARS-CoV-2 target nucleic acids are NOT DETECTED.  The SARS-CoV-2 RNA is generally detectable in upper respiratory specimens during the acute phase of infection. The lowest concentration of SARS-CoV-2 viral copies this assay can detect is 138 copies/mL. A negative result  does not preclude SARS-Cov-2 infection and should not be used as the sole basis for treatment or other patient management decisions. A negative result may occur with  improper specimen collection/handling, submission of specimen other than nasopharyngeal swab, presence of viral mutation(s) within the areas targeted by this assay, and inadequate number of viral copies(<138 copies/mL). A negative result must be combined with clinical observations, patient history, and epidemiological information. The expected result is Negative.  Fact Sheet for Patients:  EntrepreneurPulse.com.au  Fact Sheet for Healthcare Providers:  IncredibleEmployment.be  This test is no t yet approved or cleared by the [place] FDA and  has been authorized for detection and/or diagnosis of SARS-CoV-2 by FDA under an Emergency Use Authorization (EUA). This EUA will remain  in effect (meaning this test can be used) for the duration of the COVID-19 declaration under Section 564(b)(1) of the Act, 21 U.S.C.section 360bbb-3(b)(1), unless the authorization is terminated  or revoked sooner.       Influenza A by PCR NEGATIVE NEGATIVE Final   Influenza B by PCR NEGATIVE NEGATIVE Final    Comment: (NOTE) The Xpert Xpress SARS-CoV-2/FLU/RSV plus assay is intended as an aid in the diagnosis of influenza from Nasopharyngeal swab specimens and should not be used as a sole basis for treatment. Nasal washings and aspirates are unacceptable for Xpert Xpress SARS-CoV-2/FLU/RSV testing.  Fact Sheet for Patients: EntrepreneurPulse.com.au  Fact Sheet for Healthcare  Providers: IncredibleEmployment.be  This test is not yet approved or cleared by the [place] FDA and has been authorized for detection and/or diagnosis of SARS-CoV-2 by FDA under an Emergency Use Authorization (EUA). This EUA will remain in effect (meaning this test can be used) for the duration of the COVID-19 declaration under Section 564(b)(1) of the Act, 21 U.S.C. section 360bbb-3(b)(1), unless the authorization is terminated or revoked.  Performed at [facility], [place] [address] [place], [place] [address]   MRSA Next Gen by PCR, Nasal     Status: None   Collection Time: 07/16/22  6:12 PM   Specimen: Nasal Mucosa; Nasal Swab  Result Value Ref Range Status   MRSA by PCR Next Gen NOT DETECTED NOT DETECTED Final    Comment: (NOTE) The GeneXpert MRSA Assay (FDA approved for NASAL specimens only), is one component of a comprehensive MRSA colonization surveillance program. It is not intended to diagnose MRSA infection nor to guide or monitor treatment for MRSA infections. Test performance is not FDA approved in patients less than 51 years old. Performed at [facility], [place] [address] [place], [place] [address]     Imaging and other diagnostics ECHOCARDIOGRAM COMPLETE  Result Date: 07/16/2022    ECHOCARDIOGRAM REPORT   Patient Name:   DERWARD MARPLE Date of Exam: 07/16/2022 Medical Rec #:  [id number]       Height:       77.0 in Accession #:    [id number]      Weight:       260.0 lb Date of Birth:  [birth year]       BSA:          2.501 m Patient Age:    46 years        BP:           144/74 mmHg Patient Gender: M               HR:           90 bpm. Exam Location:  Inpatient Procedure: 2D Echo,  Cardiac Doppler and Color Doppler Indications:    Bacteremia  History:        Patient has prior history of Echocardiogram examinations, most                 recent 06/28/2019. Cardiomyopathy, Signs/Symptoms:Chest Pain;                 Risk Factors:Hypertension,  Diabetes and Dyslipidemia. Kidney                 disorder. Dialysis catheter.  Sonographer:    Roseanna Rainbow RDCS Referring Phys: [id number] [place]  1. Left ventricular ejection fraction, by estimation, is 50 to 55%. The left ventricle has low normal function. The left ventricle has no regional wall motion abnormalities. There is mild concentric left ventricular hypertrophy. Left ventricular diastolic parameters are consistent with Grade II diastolic dysfunction (pseudonormalization).  2. Right ventricular systolic function is normal. The right ventricular size is normal. Tricuspid regurgitation signal is inadequate for assessing PA pressure.  3. The mitral valve is normal in structure. Trivial mitral valve regurgitation. No evidence of mitral stenosis.  4. Dialysis catheter tip in right atrium.  5. The aortic valve is tricuspid. Aortic valve regurgitation is moderate. No aortic stenosis is present. Cannot fully rule out small vegetation on aortic valve. Suggest TEE to assess the aortic valve.  6. Aortic dilatation noted. There is mild dilatation of the ascending aorta and of the aortic root, measuring 40 mm.  7. The inferior vena cava is dilated in size with >50% respiratory variability, suggesting right atrial pressure of 8 mmHg. FINDINGS  Left Ventricle: Left ventricular ejection fraction, by estimation, is 50 to 55%. The left ventricle has low normal function. The left ventricle has no regional wall motion abnormalities. The left ventricular internal cavity size was normal in size. There is mild concentric left ventricular hypertrophy. Left ventricular diastolic parameters are consistent with Grade II diastolic dysfunction (pseudonormalization). Right Ventricle: The right ventricular size is normal. No increase in right ventricular wall thickness. Right ventricular systolic function is normal. Tricuspid regurgitation signal is inadequate for assessing PA pressure. Left Atrium: Left atrial size was  normal in size. Right Atrium: Dialysis catheter tip in right atrium. Right atrial size was normal in size. Pericardium: There is no evidence of pericardial effusion. Mitral Valve: The mitral valve is normal in structure. Trivial mitral valve regurgitation. No evidence of mitral valve stenosis. Tricuspid Valve: The tricuspid valve is normal in structure. Tricuspid valve regurgitation is not demonstrated. Aortic Valve: The aortic valve is tricuspid. Aortic valve regurgitation is moderate. Aortic regurgitation PHT measures 389 msec. No aortic stenosis is present. Pulmonic Valve: The pulmonic valve was normal in structure. Pulmonic valve regurgitation is not visualized. Aorta: Aortic dilatation noted. There is mild dilatation of the ascending aorta and of the aortic root, measuring 40 mm. Venous: The inferior vena cava is dilated in size with greater than 50% respiratory variability, suggesting right atrial pressure of 8 mmHg. IAS/Shunts: No atrial level shunt detected by color flow Doppler.  LEFT VENTRICLE PLAX 2D LVIDd:         5.60 cm LVIDs:         4.00 cm LV PW:         1.40 cm LV IVS:        1.20 cm LVOT diam:     2.80 cm LV SV:         145 LV SV Index:   58 LVOT Area:  6.16 cm  LV Volumes (MOD) LV vol d, MOD A2C: 198.0 ml LV vol d, MOD A4C: 206.5 ml LV vol s, MOD A2C: 84.2 ml LV vol s, MOD A4C: 91.5 ml LV SV MOD A2C:     113.8 ml LV SV MOD A4C:     206.5 ml LV SV MOD BP:      119.6 ml RIGHT VENTRICLE             IVC RV S prime:     14.00 cm/s  IVC diam: 2.40 cm TAPSE (M-mode): 2.0 cm LEFT ATRIUM             Index        RIGHT ATRIUM          Index LA diam:        4.00 cm 1.60 cm/m   RA Area:     7.04 cm LA Vol (A2C):   70.5 ml 28.19 ml/m  RA Volume:   10.90 ml 4.36 ml/m LA Vol (A4C):   74.2 ml 29.67 ml/m LA Biplane Vol: 79.8 ml 31.91 ml/m  AORTIC VALVE LVOT Vmax:   127.00 cm/s LVOT Vmean:  81.800 cm/s LVOT VTI:    0.236 m AI PHT:      389 msec  AORTA Ao Root diam: 3.90 cm Ao Asc diam:  3.80 cm MITRAL  VALVE MV Area (PHT): 4.43 cm    SHUNTS MV Decel Time: 171 msec    Systemic VTI:  0.24 m MV E velocity: 79.13 cm/s  Systemic Diam: 2.80 cm MV A velocity: 87.47 cm/s MV E/A ratio:  0.90 Dalton McleanMD Electronically signed by Franki Monte Signature Date/Time: 07/16/2022/5:14:52 PM    Final    MR Lumbar Spine W Wo Contrast  Result Date: 07/15/2022 CLINICAL DATA:  Low back pain, infection suspected EXAM: MRI LUMBAR SPINE WITHOUT AND WITH CONTRAST TECHNIQUE: Multiplanar and multiecho pulse sequences of the lumbar spine were obtained without and with intravenous contrast. CONTRAST:  3mL GADAVIST GADOBUTROL 1 MMOL/ML IV SOLN COMPARISON:  No prior MRI, correlation is made with CT lumbar spine 07/15/2022 FINDINGS: Segmentation:  Standard. Alignment: Straightening of the normal lumbar lordosis. No listhesis. Vertebrae: No acute fracture or suspicious osseous lesion. Increased T2 signal and minimal contrast enhancement at the anterior endplates about W2-B7, without definite abnormal signal in the disc space. Congenitally short pedicles, which narrow the AP diameter of the spinal canal. Conus medullaris and cauda equina: Conus extends to the T12-L1 level. Conus and cauda equina appear normal. Paraspinal and other soft tissues: Negative. Disc levels: T12-L1: No significant disc bulge. Mild facet arthropathy. No spinal canal stenosis. No neural foraminal narrowing. L1-L2: No significant disc bulge. No spinal canal stenosis or neural foraminal narrowing. L2-L3: Minimal disc bulge. Mild facet arthropathy. No spinal canal stenosis. No neural foraminal narrowing. L3-L4: Minimal disc bulge. Mild facet arthropathy. Mild spinal canal stenosis. Narrowing of the lateral recesses. No neural foraminal narrowing. L4-L5: Mild disc bulge. Mild facet arthropathy. No spinal canal stenosis. Narrowing of the lateral recesses. No neural foraminal narrowing. L5-S1: Minimal disc bulge. Mild facet arthropathy. No spinal canal stenosis. No  neural foraminal narrowing. IMPRESSION: 1. L3-L4 mild spinal canal stenosis. Narrowing of the lateral recesses at this level could affect the descending L4 nerve roots. 2. Narrowing of the lateral recesses at L4-L5 could affect the descending L5 nerve roots. 3. No neural foraminal narrowing. 4. Edema in the anterior endplates about the S2-G3 disc space, without definite abnormal signal in the disc, favored  to be degenerative, although early discitis osteomyelitis can appear similar. Correlate with lab values and consider short interval follow-up. Electronically Signed   By: Merilyn Baba M.D.   On: 07/15/2022 19:13   CT Angio Chest PE W and/or Wo Contrast  Result Date: 07/15/2022 CLINICAL DATA:  Pulmonary embolism suspected. 150 mL of Omnipaque 350. EXAM: CT ANGIOGRAPHY CHEST WITH CONTRAST TECHNIQUE: Multidetector CT imaging of the chest was performed using the standard protocol during bolus administration of intravenous contrast. Multiplanar CT image reconstructions and MIPs were obtained to evaluate the vascular anatomy. RADIATION DOSE REDUCTION: This exam was performed according to the departmental dose-optimization program which includes automated exposure control, adjustment of the mA and/or kV according to patient size and/or use of iterative reconstruction technique. CONTRAST:  180mL OMNIPAQUE IOHEXOL 350 MG/ML SOLN COMPARISON:  None Available. FINDINGS: Cardiovascular: Satisfactory opacification of the pulmonary arteries to the segmental level. No evidence of pulmonary embolism. Normal heart size. No pericardial effusion. Left IJ access double-lumen catheter with distal tip in the right atrium. Mediastinum/Nodes: No enlarged mediastinal, hilar, or axillary lymph nodes. Thyroid gland, trachea, and esophagus demonstrate no significant findings. Lungs/Pleura: Ground-glass attenuation of the lung parenchyma prominent in the dependent portions of bilateral lower lobes concerning for airspace disease. Upper  Abdomen: Cholelithiasis without evidence of acute cholecystitis. Musculoskeletal: Cortical irregularity of the left fifth rib posteriorly, likely a chronic fracture. Mild multilevel degenerate disc disease of the thoracic spine. Review of the MIP images confirms the above findings. IMPRESSION: 1. No evidence of pulmonary embolism. 2. Ground-glass attenuation of the lung parenchyma prominent in the dependent portions of bilateral lower lobes concerning for airspace disease. 3. Cholelithiasis without evidence of acute cholecystitis. 4. Cortical irregularity of the left fifth rib posteriorly, likely a chronic fracture. Correlate with localized pain. Electronically Signed   By: Keane Police D.O.   On: 07/15/2022 16:57   CT L-SPINE NO CHARGE  Result Date: 07/15/2022 CLINICAL DATA:  Cough, abdominal pain/back pain, right-sided leg pain. EXAM: CT LUMBAR SPINE WITHOUT CONTRAST TECHNIQUE: Multidetector CT imaging of the lumbar spine was performed without intravenous contrast administration. Multiplanar CT image reconstructions were also generated. RADIATION DOSE REDUCTION: This exam was performed according to the departmental dose-optimization program which includes automated exposure control, adjustment of the mA and/or kV according to patient size and/or use of iterative reconstruction technique. COMPARISON:  Radiographs 07/09/2022 FINDINGS: Segmentation: The lowest lumbar type non-rib-bearing vertebra is labeled as L5. Alignment: No vertebral subluxation is observed. Vertebrae: Congenitally short pedicles in the lumbar spine. Schmorl's nodes and endplate sclerosis at Z8-5 with some vacuum disc phenomenon loss of intervertebral disc height at the L4-5 level. Similar findings were also present on the CT pelvis from/25/16. No lumbar spine fracture or acute bony findings. Paraspinal and other soft tissues: No significant paraspinal edema. Otherwise please see dedicated CT abdomen report. Disc levels: T12-L1: Mild  bilateral foraminal impingement due to short pedicles. L1-2: No impingement. L2-3: Mild bilateral foraminal stenosis and mild central narrowing of the thecal sac due to short pedicles and disc bulge. Faint calcification along the posterior annulus fibrosis of the intervertebral disc. L3-4: Moderate central narrowing of the thecal sac and mild to moderate bilateral foraminal stenosis due to disc bulge and short pedicles. L4-5: Moderate central narrowing of the thecal sac and mild bilateral foraminal stenosis due to disc bulge and short pedicles. Calcification along the posterior annulus fibrosis of the intervertebral disc. L5-S1: Mild to moderate bilateral foraminal stenosis and borderline central narrowing of the thecal sac due  to short pedicles and degenerative facet arthropathy. Bilateral facet sclerosis is present, right greater than left. No impingement is identified along the sacral plexus. IMPRESSION: 1. Lumbar spondylosis, congenitally short pedicles, and degenerative disc disease, causing moderate impingement at L3-4 and L4-5; mild to moderate impingement at L5-S1; and mild impingement at T12-L1 and L2-3. Electronically Signed   By: Van Clines M.D.   On: 07/15/2022 16:50   CT ABDOMEN PELVIS W CONTRAST  Result Date: 07/15/2022 CLINICAL DATA:  Acute generalized abdominal pain, sepsis. EXAM: CT ABDOMEN AND PELVIS WITH CONTRAST TECHNIQUE: Multidetector CT imaging of the abdomen and pelvis was performed using the standard protocol following bolus administration of intravenous contrast. RADIATION DOSE REDUCTION: This exam was performed according to the departmental dose-optimization program which includes automated exposure control, adjustment of the mA and/or kV according to patient size and/or use of iterative reconstruction technique. CONTRAST:  12mL OMNIPAQUE IOHEXOL 350 MG/ML SOLN COMPARISON:  Jan 31, 2015.  October 09, 2019. FINDINGS: Lower chest: Mild left basilar atelectasis or infiltrate is  noted. Hepatobiliary: Mild cholelithiasis is noted. No biliary dilatation is noted. No focal hepatic abnormality is noted. Pancreas: Unremarkable. No pancreatic ductal dilatation or surrounding inflammatory changes. Spleen: Normal in size without focal abnormality. Adrenals/Urinary Tract: Right adrenal gland appears normal. 2.1 cm left adrenal nodule is noted with average Hounsfield measurement of 38. No hydronephrosis or renal obstruction is noted. 2.8 cm cyst seen in upper pole of left kidney. Two smaller low densities are noted in lower pole which may represent cysts, but renal ultrasound is recommended for confirmation. Urinary bladder is unremarkable. Stomach/Bowel: Stomach is within normal limits. Appendix appears normal. No evidence of bowel wall thickening, distention, or inflammatory changes. Vascular/Lymphatic: No significant vascular findings are present. No enlarged abdominal or pelvic lymph nodes. Reproductive: Prostate is unremarkable. Other: No abdominal wall hernia or abnormality. No abdominopelvic ascites. Musculoskeletal: No acute or significant osseous findings. IMPRESSION: Mild left basilar atelectasis or infiltrate is noted. Mild cholelithiasis without evidence of cholecystitis. 2.1 cm left adrenal nodule is noted. Follow-up CT scan or MRI in 12 months is recommended to ensure stability and rule out neoplasm. 2.8 cm simple cyst is seen in upper pole of left kidney. Two smaller low densities are noted in the lower pole of left kidney which may represent cyst, but renal ultrasound is recommended for confirmation and to rule out neoplasm. Electronically Signed   By: Marijo Conception M.D.   On: 07/15/2022 16:50   DG Chest 2 View  Result Date: 07/15/2022 CLINICAL DATA:  Suspected sepsis.  Fever. EXAM: CHEST - 2 VIEW COMPARISON:  07/02/2021 FINDINGS: Central line placed from the left has its tip in the proximal right atrium. Heart size is normal. Mildly tortuous aorta. The lungs are clear. No  effusions. Old left-sided rib fracture and mild curvature of the spine as seen previously. IMPRESSION: No active cardiopulmonary disease. Central line tip in the proximal right atrium. Electronically Signed   By: Nelson Chimes M.D.   On: 07/15/2022 13:53   DG Lumbar Spine Complete  Result Date: 07/09/2022 CLINICAL DATA:  Right-sided shooting leg pain EXAM: LUMBAR SPINE - COMPLETE 4+ VIEW COMPARISON:  None Available. FINDINGS: Straightening of the normal lumbar lordosis. Mild disc space loss in the lower lumbar spine with anterior osteophytes at L4 and L5. Vertebral body heights are preserved. There is mild-to-moderate lower lumbar spine predominant facet degenerative change. Moderate colonic stool burden. No dilated loops of small bowel are visualized. IMPRESSION: Mild lower lumbar spine predominant  degenerative change. Electronically Signed   By: Marin Roberts M.D.   On: 07/09/2022 11:44    Nani Gasser MD 07/17/2022, 10:20 AM  Pager: [phone]

## 2022-07-18 ENCOUNTER — Encounter (HOSPITAL_COMMUNITY): Payer: Self-pay | Admitting: Internal Medicine

## 2022-07-18 ENCOUNTER — Encounter (HOSPITAL_COMMUNITY)
Admission: EM | Disposition: A | Discharge status: Home / Self Care | Payer: Self-pay | Source: Home / Self Care | Attending: Internal Medicine

## 2022-07-18 ENCOUNTER — Inpatient Hospital Stay (HOSPITAL_COMMUNITY): Payer: Medicaid Other | Admitting: Anesthesiology

## 2022-07-18 ENCOUNTER — Inpatient Hospital Stay (HOSPITAL_COMMUNITY): Payer: Medicaid Other

## 2022-07-18 ENCOUNTER — Ambulatory Visit (HOSPITAL_COMMUNITY): Admit: 2022-07-18 | Payer: Medicaid Other | Admitting: Internal Medicine

## 2022-07-18 DIAGNOSIS — I351 Nonrheumatic aortic (valve) insufficiency: Secondary | ICD-10-CM | POA: Diagnosis not present

## 2022-07-18 DIAGNOSIS — K011 Impacted teeth: Secondary | ICD-10-CM | POA: Diagnosis not present

## 2022-07-18 DIAGNOSIS — I080 Rheumatic disorders of both mitral and aortic valves: Secondary | ICD-10-CM

## 2022-07-18 DIAGNOSIS — I82210 Acute embolism and thrombosis of superior vena cava: Secondary | ICD-10-CM | POA: Diagnosis not present

## 2022-07-18 DIAGNOSIS — R7881 Bacteremia: Secondary | ICD-10-CM | POA: Diagnosis not present

## 2022-07-18 DIAGNOSIS — N250 Renal osteodystrophy: Secondary | ICD-10-CM | POA: Diagnosis not present

## 2022-07-18 DIAGNOSIS — Z794 Long term (current) use of insulin: Secondary | ICD-10-CM

## 2022-07-18 DIAGNOSIS — E1122 Type 2 diabetes mellitus with diabetic chronic kidney disease: Secondary | ICD-10-CM | POA: Diagnosis not present

## 2022-07-18 DIAGNOSIS — N186 End stage renal disease: Secondary | ICD-10-CM | POA: Diagnosis not present

## 2022-07-18 DIAGNOSIS — T80211A Bloodstream infection due to central venous catheter, initial encounter: Secondary | ICD-10-CM | POA: Diagnosis not present

## 2022-07-18 DIAGNOSIS — I509 Heart failure, unspecified: Secondary | ICD-10-CM

## 2022-07-18 DIAGNOSIS — I120 Hypertensive chronic kidney disease with stage 5 chronic kidney disease or end stage renal disease: Secondary | ICD-10-CM | POA: Diagnosis not present

## 2022-07-18 DIAGNOSIS — I350 Nonrheumatic aortic (valve) stenosis: Secondary | ICD-10-CM | POA: Diagnosis not present

## 2022-07-18 DIAGNOSIS — Z992 Dependence on renal dialysis: Secondary | ICD-10-CM | POA: Diagnosis not present

## 2022-07-18 DIAGNOSIS — I132 Hypertensive heart and chronic kidney disease with heart failure and with stage 5 chronic kidney disease, or end stage renal disease: Secondary | ICD-10-CM | POA: Diagnosis not present

## 2022-07-18 DIAGNOSIS — I330 Acute and subacute infective endocarditis: Secondary | ICD-10-CM | POA: Diagnosis not present

## 2022-07-18 DIAGNOSIS — A498 Other bacterial infections of unspecified site: Secondary | ICD-10-CM | POA: Diagnosis not present

## 2022-07-18 DIAGNOSIS — Z7984 Long term (current) use of oral hypoglycemic drugs: Secondary | ICD-10-CM | POA: Diagnosis not present

## 2022-07-18 DIAGNOSIS — D631 Anemia in chronic kidney disease: Secondary | ICD-10-CM | POA: Diagnosis not present

## 2022-07-18 DIAGNOSIS — I339 Acute and subacute endocarditis, unspecified: Secondary | ICD-10-CM | POA: Diagnosis not present

## 2022-07-18 DIAGNOSIS — R5081 Fever presenting with conditions classified elsewhere: Secondary | ICD-10-CM | POA: Diagnosis not present

## 2022-07-18 DIAGNOSIS — R509 Fever, unspecified: Secondary | ICD-10-CM | POA: Diagnosis not present

## 2022-07-18 DIAGNOSIS — B952 Enterococcus as the cause of diseases classified elsewhere: Secondary | ICD-10-CM | POA: Diagnosis not present

## 2022-07-18 HISTORY — PX: TEE WITHOUT CARDIOVERSION: SHX5443

## 2022-07-18 LAB — CULTURE, BLOOD (ROUTINE X 2): Special Requests: ADEQUATE

## 2022-07-18 LAB — GLUCOSE, CAPILLARY
Glucose-Capillary: 188 mg/dL — ABNORMAL HIGH (ref 70–99)
Glucose-Capillary: 201 mg/dL — ABNORMAL HIGH (ref 70–99)
Glucose-Capillary: 169 mg/dL — ABNORMAL HIGH (ref 70–99)
Glucose-Capillary: 91 mg/dL (ref 70–99)

## 2022-07-18 LAB — RENAL FUNCTION PANEL
Albumin: 2.2 g/dL — ABNORMAL LOW (ref 3.5–5.0)
Anion gap: 14 (ref 5–15)
BUN: 52 mg/dL — ABNORMAL HIGH (ref 6–20)
CO2: 22 mmol/L (ref 22–32)
Calcium: 8.1 mg/dL — ABNORMAL LOW (ref 8.9–10.3)
Chloride: 98 mmol/L (ref 98–111)
Creatinine, Ser: 9.76 mg/dL — ABNORMAL HIGH (ref 0.61–1.24)
GFR, Estimated: 6 mL/min — ABNORMAL LOW (ref >60)
Glucose, Bld: 160 mg/dL — ABNORMAL HIGH (ref 70–99)
Phosphorus: 4.6 mg/dL (ref 2.5–4.6)
Potassium: 3.9 mmol/L (ref 3.5–5.1)
Sodium: 134 mmol/L — ABNORMAL LOW (ref 135–145)

## 2022-07-18 LAB — CBC
HCT: 26.3 % — ABNORMAL LOW (ref 39.0–52.0)
Hemoglobin: 8.1 g/dL — ABNORMAL LOW (ref 13.0–17.0)
MCH: 26.7 pg (ref 26.0–34.0)
MCHC: 30.8 g/dL (ref 30.0–36.0)
MCV: 86.8 fL (ref 80.0–100.0)
Platelets: 288 10*3/uL (ref 150–400)
RBC: 3.03 MIL/uL — ABNORMAL LOW (ref 4.22–5.81)
RDW: 15.9 % — ABNORMAL HIGH (ref 11.5–15.5)
WBC: 9.6 10*3/uL (ref 4.0–10.5)
nRBC: 0 % (ref 0.0–0.2)

## 2022-07-18 LAB — HEPATITIS B SURFACE ANTIBODY, QUANTITATIVE: Hep B S AB Quant (Post): 35.3 m[IU]/mL (ref >9.9)

## 2022-07-18 LAB — ECHO TEE: P 1/2 time: 264 msec

## 2022-07-18 SURGERY — ECHOCARDIOGRAM, TRANSESOPHAGEAL
Anesthesia: Monitor Anesthesia Care

## 2022-07-18 MED ORDER — LIDOCAINE 2% (20 MG/ML) 5 ML SYRINGE
INTRAMUSCULAR | Status: DC | PRN
  Administered 2022-07-18: 50 mg via INTRAVENOUS

## 2022-07-18 MED ORDER — BUTAMBEN-TETRACAINE-BENZOCAINE 2-2-14 % EX AERO
INHALATION_SPRAY | CUTANEOUS | Status: DC | PRN
  Administered 2022-07-18: 2 via TOPICAL

## 2022-07-18 MED ORDER — METHOCARBAMOL 500 MG PO TABS
500.0000 mg | ORAL_TABLET | Freq: Three times a day (TID) | ORAL | Status: DC | PRN
  Administered 2022-07-18 – 2022-07-19 (×2): 500 mg via ORAL
  Filled 2022-07-18: qty 1

## 2022-07-18 MED ORDER — PROPOFOL 500 MG/50ML IV EMUL
INTRAVENOUS | Status: DC | PRN
  Administered 2022-07-18: 75 ug/kg/min via INTRAVENOUS

## 2022-07-18 MED ORDER — GLYCOPYRROLATE 0.2 MG/ML IJ SOLN
INTRAMUSCULAR | Status: DC | PRN
  Administered 2022-07-18: .2 mg via INTRAVENOUS

## 2022-07-18 MED ORDER — SODIUM CHLORIDE 0.9 % IV SOLN: INTRAVENOUS | Status: DC | PRN

## 2022-07-18 MED ORDER — DEXMEDETOMIDINE HCL IN NACL 80 MCG/20ML IV SOLN
INTRAVENOUS | Status: DC | PRN
  Administered 2022-07-18: 8 ug via INTRAVENOUS

## 2022-07-18 MED ORDER — PROPOFOL 10 MG/ML IV BOLUS
INTRAVENOUS | Status: DC | PRN
  Administered 2022-07-18: 30 mg via INTRAVENOUS
  Administered 2022-07-18 (×2): 40 mg via INTRAVENOUS

## 2022-07-18 NOTE — Interval H&P Note (Signed)
History and Physical Interval Note:  07/18/2022 1:37 PM  Jeremy [name]  has presented today for surgery, with the diagnosis of bacteremia.  The various methods of treatment have been discussed with the patient and family. After consideration of risks, benefits and other options for treatment, the patient has consented to  Procedure(s): TRANSESOPHAGEAL ECHOCARDIOGRAM (TEE) (N/A) as a surgical intervention.  The patient's history has been reviewed, patient examined, no change in status, stable for surgery.  I have reviewed the patient's chart and labs.  Questions were answered to the patient's satisfaction.     Janina Mayo

## 2022-07-18 NOTE — Progress Notes (Signed)
Pt telemetry observed to be on and off and he is getting irritable as we keep coming to put it back on, pt said he does not want it back on if it comes off again, Dr on call ( Dr Nooruddin) paged and notified, ordered to D/C tele at this time, will however continue to monitor. Obasogie-Asidi, Demir Titsworth Efe

## 2022-07-18 NOTE — Anesthesia Procedure Notes (Addendum)
Procedure Name: MAC Date/Time: 07/18/2022 1:51 PM  Performed by: Mariea Clonts, CRNAPre-anesthesia Checklist: Patient identified, Emergency Drugs available, Suction available, Patient being monitored and Timeout performed Patient Re-evaluated:Patient Re-evaluated prior to induction Oxygen Delivery Method: Simple face mask and Nasal cannula

## 2022-07-18 NOTE — Progress Notes (Signed)
  Echocardiogram 2D Echocardiogram has been performed.  Jeremy [name] M 07/18/2022, 2:42 PM

## 2022-07-18 NOTE — CV Procedure (Signed)
INDICATIONS: Aortic Insufficiency  PROCEDURE:   Informed consent was obtained prior to the procedure. The risks, benefits and alternatives for the procedure were discussed and the patient comprehended these risks.  Risks include, but are not limited to, cough, sore throat, vomiting, nausea, somnolence, esophageal and stomach trauma or perforation, bleeding, low blood pressure, aspiration, pneumonia, infection, trauma to the teeth and death.    After a procedural time-out, the oropharynx was anesthetized with 20% benzocaine spray.   During this procedure the patient was administered propofol to achieve and maintain moderate conscious sedation.  The patient's heart rate, blood pressure, and oxygen saturationweare monitored continuously during the procedure. The period of conscious sedation was 20 minutes, of which I was present face-to-face 100% of this time.  The transesophageal probe was inserted in the esophagus and stomach without difficulty and multiple views were obtained.  The patient was kept under observation until the patient left the procedure room.  The patient left the procedure room in stable condition.   Agitated microbubble saline contrast was not administered.  COMPLICATIONS:    There were no immediate complications.  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

## 2022-07-18 NOTE — Transfer of Care (Signed)
Immediate Anesthesia Transfer of Care Note  Patient: Jeremy [name]  Procedure(s) Performed: TRANSESOPHAGEAL ECHOCARDIOGRAM (TEE)  Patient Location: Endoscopy Unit  Anesthesia Type:MAC  Level of Consciousness: awake, alert , and oriented  Airway & Oxygen Therapy: Patient Spontanous Breathing and Patient connected to nasal cannula oxygen  Post-op Assessment: Report given to RN and Post -op Vital signs reviewed and stable  Post vital signs: Reviewed and stable  Last Vitals:  Vitals Value Taken Time  BP    Temp    Pulse 79 07/18/22 1424  Resp 28 07/18/22 1424  SpO2 98 % 07/18/22 1424    Last Pain:  Vitals:   07/18/22 1324  TempSrc: Temporal  PainSc: 0-No pain      Patients Stated Pain Goal: 0 (48/47/20 7218)  Complications: No notable events documented.

## 2022-07-18 NOTE — TOC Initial Note (Signed)
Transition of Care [facility]) - Initial/Assessment Note    Patient Details  Name: Jeremy [name] MRN: [id number] Date of Birth: 02-02-1981  [facility]) CM/SW Contact:    Pollie Friar, RN Phone Number: 07/18/2022, 10:37 AM  Clinical Narrative:                 Pt is from home with his spouse. He states she is with him most of the time and can provide needed supervision.  No DME at home. Walker recommended and CM will have it delivered to the room per [employer]. Pt has recommendations for outpatient PT. Pt asked to attend close to his home. CM has arranged outpatient through the [facility] office. Information on the AVS. Pt states he sometimes has issues with transportation. Cm has added the number for [employer] to his AVS as this is the company that his medicaid uses as for their transportation needs. Pt will just need to call and arrange transportation 2-3 days in advance. Pt is aware.  TOC following for further d/c needs.   Expected Discharge Plan: OP Rehab Barriers to Discharge: Continued Medical Work up   Patient Goals and CMS Choice     Choice offered to / list presented to : Patient  Expected Discharge Plan and Services Expected Discharge Plan: OP Rehab   Discharge Planning Services: CM Consult Post Acute Care Choice: Durable Medical Equipment Living arrangements for the past 2 months: Single Family Home                 DME Arranged: Walker rolling DME Agency: [employer] Date DME Agency Contacted: 07/18/22   Representative spoke with at DME Agency: Annie Sable            Prior Living Arrangements/Services Living arrangements for the past 2 months: [place] Lives with:: Spouse Patient language and need for interpreter reviewed:: Yes Do you feel safe going back to the place where you live?: Yes        Care giver support system in place?: Yes (comment)   Criminal Activity/Legal Involvement Pertinent to Current Situation/Hospitalization: No  - Comment as needed  Activities of Daily Living Home Assistive Devices/Equipment: None ADL Screening (condition at time of admission) Patient's cognitive ability adequate to safely complete daily activities?: Yes Is the patient deaf or have difficulty hearing?: No Does the patient have difficulty seeing, even when wearing glasses/contacts?: No Does the patient have difficulty concentrating, remembering, or making decisions?: No Patient able to express need for assistance with ADLs?: Yes Does the patient have difficulty dressing or bathing?: No Independently performs ADLs?: Yes (appropriate for developmental age) Does the patient have difficulty walking or climbing stairs?: No Weakness of Legs: None Weakness of Arms/Hands: None  Permission Sought/Granted                  Emotional Assessment Appearance:: Appears stated age Attitude/Demeanor/Rapport: Engaged Affect (typically observed): Accepting Orientation: : Oriented to Self, Oriented to Place, Oriented to  Time, Oriented to Situation   Psych Involvement: No (comment)  Admission diagnosis:  Hyperglycemia [R73.9] ESRD (end stage renal disease) ([place]) [N18.6] Elevated troponin [R79.89] Fever [R50.9] Pneumonia of both lower lobes due to infectious organism [J18.9] Acute midline low back pain with right-sided sciatica [M54.41] Sepsis without acute organ dysfunction, due to unspecified organism [facility]) [A41.9] Patient Active Problem List   Diagnosis Date Noted   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   Bacteremia due to Enterococcus 07/16/2022   Catheter-related bloodstream infection 07/16/2022  Enterococcus faecalis infection 07/16/2022   Fever 07/15/2022   Unspecified protein-calorie malnutrition ([place]) 10/27/2019   Anemia, unspecified 10/26/2019   Acute kidney failure, unspecified ([place]) 10/21/2019   Coagulation defect, unspecified ([place]) 10/21/2019   Personal history of COVID-19 10/21/2019   Secondary hyperparathyroidism  of renal origin ([place]) 10/21/2019   COVID-19 virus infection    Mass of soft tissue of face 09/23/2018   Onychomycosis due to dermatophyte 03/29/2018   Anemia, iron deficiency 07/18/2016   Anemia in chronic kidney disease 07/18/2016   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, stage 5 ([place]) 05/29/2016   Suspected sleep apnea 05/24/2015   Chronic combined systolic and diastolic CHF (congestive heart failure) ([place]) 03/01/2015   Nonischemic cardiomyopathy ([place])    Chest pain 01/09/2015   Asthma    Hyperlipidemia 03/31/2012   Obesity, Class I, BMI 30-34.9 03/21/2011   Essential hypertension 03/21/2011   Gastroesophageal reflux disease 03/21/2011   Type 2 diabetes mellitus with diabetic nephropathy ([place]) 02/20/2011   PCP:  Rick Duff, MD Pharmacy:   [facility] [address] [place] [address] Phone: [phone] Fax: [phone]  [id number] - [place], [place] [address] [place] [address] Phone: [phone] Fax: [phone]     Social Determinants of Health (SDOH) Interventions Food Insecurity Interventions: Intervention Not Indicated Housing Interventions: Intervention Not Indicated Utilities Interventions: Intervention Not Indicated  Readmission Risk Interventions     No data to display

## 2022-07-18 NOTE — Anesthesia Postprocedure Evaluation (Signed)
Anesthesia Post Note  Patient: Jeremy [name]  Procedure(s) Performed: TRANSESOPHAGEAL ECHOCARDIOGRAM (TEE)     Patient location during evaluation: PACU Anesthesia Type: MAC Level of consciousness: awake and alert Pain management: pain level controlled Vital Signs Assessment: post-procedure vital signs reviewed and stable Respiratory status: spontaneous breathing, nonlabored ventilation and respiratory function stable Cardiovascular status: stable and blood pressure returned to baseline Anesthetic complications: no   No notable events documented.  Last Vitals:  Vitals:   07/18/22 1450 07/18/22 1500  BP: (!) 139/57 (!) 161/84  Pulse: 80 83  Resp: 20 (!) 21  Temp:    SpO2: 98% 98%    Last Pain:  Vitals:   07/18/22 1500  TempSrc:   PainSc: 0-No pain                 [name]

## 2022-07-18 NOTE — Anesthesia Preprocedure Evaluation (Addendum)
Anesthesia Evaluation  Patient identified by MRN, date of birth, ID band Patient awake    Reviewed: Allergy & Precautions, NPO status , Patient's Chart, lab work & pertinent test results, reviewed documented beta blocker date and time   History of Anesthesia Complications (+) PONV and history of anesthetic complications  Airway Mallampati: I  TM Distance: >3 FB Neck ROM: Full    Dental  (+) Dental Advisory Given, Teeth Intact   Pulmonary asthma    Pulmonary exam normal        Cardiovascular hypertension, Pt. on medications and Pt. on home beta blockers +CHF  Normal cardiovascular exam+ Valvular Problems/Murmurs AI    '23 TTE - EF 50 to 55%. There is mild concentric left ventricular hypertrophy. Grade II diastolic dysfunction (pseudonormalization). Trivial mitral valve regurgitation. Dialysis catheter tip in right atrium. Aortic valve regurgitation is moderate. Cannot fully rule out small vegetation on aortic valve. Suggest TEE to assess the aortic valve. There is mild dilatation of the ascending aorta and of the aortic root, measuring 40 mm.      Neuro/Psych Seizures -, Well Controlled,  PSYCHIATRIC DISORDERS  Depression       GI/Hepatic ,GERD  Controlled and Medicated,,(+)     substance abuse  marijuana use  Endo/Other  diabetes, Type 2, Oral Hypoglycemic Agents, Insulin Dependent   Obesity   Renal/GU ESRF and DialysisRenal disease     Musculoskeletal negative musculoskeletal ROS (+)    Abdominal   Peds  Hematology  (+) Blood dyscrasia, anemia   Anesthesia Other Findings   Reproductive/Obstetrics                             Anesthesia Physical Anesthesia Plan  ASA: 3  Anesthesia Plan: MAC   Post-op Pain Management: Minimal or no pain anticipated   Induction:   PONV Risk Score and Plan: 1 and Propofol infusion and Treatment may vary due to age or medical condition  Airway  Management Planned: Nasal Cannula and Natural Airway  Additional Equipment: None  Intra-op Plan:   Post-operative Plan:   Informed Consent: I have reviewed the patients History and Physical, chart, labs and discussed the procedure including the risks, benefits and alternatives for the proposed anesthesia with the patient or authorized representative who has indicated his/her understanding and acceptance.       Plan Discussed with: CRNA and Anesthesiologist  Anesthesia Plan Comments:        Anesthesia Quick Evaluation

## 2022-07-18 NOTE — Progress Notes (Addendum)
Subjective:   Summary: Jeremy [name] is a 41 y.o. year old male currently admitted on the IMTS HD#3 for bacteremia 2/2 presumed central line infection.  Overnight Events: - NAEON - He denies any fevers and chills, nausea or vomiting, abdominal pain, red flag symptoms for back pain - Discussed the importance of staying for TEE this afternoon and dialysis access replacement tomorrow   Objective:  Vital signs in last 24 hours: Vitals:   07/17/22 1825 07/17/22 2032 07/18/22 0038 07/18/22 0407  BP: (!) 182/73 (!) 148/60 (!) 119/49 (!) 143/64  Pulse: 90 91 87 71  Resp: 18 18 11 18   Temp:  99.5 F (37.5 C) 99.2 F (37.3 C) 98.9 F (37.2 C)  TempSrc:  Oral Oral Oral  SpO2: 99% 99% 97% 100%  Weight:      Height:       Supplemental O2: Room Air SpO2: 100 %   Physical Exam:  Constitutional: NAD Cardiovascular: RRR, no murmurs, rubs or gallops Pulmonary/Chest: normal work of breathing on room air, lungs clear to auscultation bilaterally.  Catheter removed, prior site covered in dressings. Abdomen: Soft, nondistended, nontender Skin: warm and dry Extremities: upper/lower extremity pulses 2+, no lower extremity edema present   Filed Weights   07/15/22 1321  Weight: 117.9 kg     Intake/Output Summary (Last 24 hours) at 07/18/2022 0634 Last data filed at 07/17/2022 1800 Gross per 24 hour  Intake 800 ml  Output --  Net 800 ml    Net IO Since Admission: -432.09 mL [07/18/22 0634]  Pertinent Labs:    Latest Ref Rng & Units 07/18/2022    3:35 AM 07/17/2022    7:12 AM 07/16/2022    1:39 AM  CBC  WBC 4.0 - 10.5 K/uL 9.6  10.9  12.3   Hemoglobin 13.0 - 17.0 g/dL 8.1  9.2  8.8   Hematocrit 39.0 - 52.0 % 26.3  29.2  27.5   Platelets 150 - 400 K/uL 288  312  320        Latest Ref Rng & Units 07/18/2022    3:35 AM 07/17/2022    7:12 AM 07/16/2022    1:39 AM  CMP  Glucose 70 - 99 mg/dL 160  144  272   BUN 6 - 20 mg/dL 52  42  63   Creatinine 0.61 -  1.24 mg/dL 9.76  7.41  9.83   Sodium 135 - 145 mmol/L 134  134  136   Potassium 3.5 - 5.1 mmol/L 3.9  3.4  4.2   Chloride 98 - 111 mmol/L 98  99  106   CO2 22 - 32 mmol/L 22  23  19    Calcium 8.9 - 10.3 mg/dL 8.1  8.6  8.3   CBGs 188, 104, 156 Urine culture pending Initial blood culture showing Enterococcus faecalis, repeat cultures after line removal pending, fungal culture pending   Imaging: US RENAL  Result Date: 07/17/2022 CLINICAL DATA:  Evaluate for renal cyst. EXAM: RENAL / URINARY TRACT ULTRASOUND COMPLETE COMPARISON:  07/15/2022 FINDINGS: Right Kidney: Renal measurements: 11.2 x 5.9 x 5.6 cm = volume: 194.8 mL. Echogenicity within normal limits. No mass or hydronephrosis visualized. Left Kidney: Renal measurements: 11.9 x 6.2 x 5.7 cm = volume: 220.5 mL. Anechoic cyst with increased through transmission is identified arising off the interpolar left kidney measuring 3.4 x 2.5 x 2.8 cm. The additional small low density  lesions within the inferior pole of the left kidney are not confidently identified on current exam and may be too small to visualize via ultrasound. No suspicious kidney mass identified. Bladder: Appears normal for degree of bladder distention. Other: None. IMPRESSION: 1. No acute findings. 2. Simple appearing left renal cyst. The additional small low density lesions within the inferior pole of the left kidney are not confidently identified on current exam and may be too small to visualize via ultrasound. No suspicious kidney mass identified. Electronically Signed   By: Kerby Moors M.D.   On: 07/17/2022 15:23   IR Removal Tun Cv Cath W/O FL  Result Date: 07/17/2022 INDICATION: Patient with a tunneled dialysis catheter placed by vascular team. Patient now with bacteremia. Interventional radiology asked to remove tunneled dialysis catheter for line holiday. EXAM: REMOVAL TUNNELED CENTRAL VENOUS CATHETER MEDICATIONS: 1% lidocaine 10 mL ANESTHESIA/SEDATION: None FLUOROSCOPY:  None COMPLICATIONS: None immediate. PROCEDURE: Informed written consent was obtained from the patient after a thorough discussion of the procedural risks, benefits and alternatives. All questions were addressed. Maximal Sterile Barrier Technique was utilized including caps, mask, sterile gowns, sterile gloves, sterile drape, hand hygiene and skin antiseptic. A timeout was performed prior to the initiation of the procedure. The patient's left chest and catheter was prepped and draped in a normal sterile fashion. Heparin was removed from both ports of catheter. 1% lidocaine was used for local anesthesia. Using gentle blunt dissection and moderate manual traction the cuff of the catheter was exposed and the catheter was removed in it's entirety. Pressure was held till hemostasis was obtained. A sterile dressing was applied. The patient tolerated the procedure well with no immediate complications. IMPRESSION: Successful catheter removal as described above. Read by: Soyla Dryer, NP Electronically Signed   By: Miachel Roux M.D.   On: 07/17/2022 11:00     EKG:   Assessment/Plan:  Patient Summary: Jeremy [name] is a 41 y.o. with a pertinent PMH of HTN, CHF, ESRD on TTS HD, who presented with fevers and chills and admitted for bacteremia.    #E. Faecalis Bacteremia #Concern for endocarditis He is no longer having any fevers or chills, leukocytosis is downtrending on current antimicrobial therapy.  Repeat blood cultures after line removal still pending.  TEE official results pending, but messaged by Dr. Harl Bowie about severe aortic insufficiency and concern for vegetation, recommended CT surgery consult. Plan: - Appreciate ID assistance - Ampicillin 2g Q12H for 10-14 days (Day 2) plus ceftriaxone 2 g twice daily - Monitor CBC and fever  -Follow-up blood cultures - F/U TEE results, will discuss with patient and consult CT surgery for evaluation.  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  Still no dialysis access, will be replaced tomorrow with IR.  He will need follow-up with vascular surgery for long-term dialysis access. Plan:  - Continue Revelmar - Zofran as needed after dialysis to manage nausea and vomiting -HD per nephrology, appreciate recs -Replace dialysis catheter after line holiday, appreciate IR assistance.  Reassess long-term access in the outpatient setting with vascular surgery   #Back pain, suspect MSK d/t degenerative changes Back pain slightly better on Tylenol, Robaxin, lidocaine patch.  Appears to be MSK in nature.  He has no red flag symptoms.  Will not reimage at this time, but can consider if he develops any worsening or alarming symptoms.  Believe PT and mobility will also help his pain.  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  He is already on significant antihypertensive regimen, with blood pressures better managed after dialysis, will try to modify as appropriate can possibly consider exchanging hydralazine for an ARNI if appropriate. Plan:  - Continue Coreg 37.5 twice daily, clonidine 0.3 mg twice daily, hydralazine 100 mg 3 times daily, amlodipine 10 mg daily   #Elevated troponin, likely demand ischemia Pt had elevated tropnins at 256, then repeat at 212. Low suspicion of ACS.   #Type 2 Diabetes Mellitus  N.p.o. for most of today and will be n.p.o. again from midnight tonight.  Would not be too aggressive with insulin at this time. Plan:  - Lantus 25U At bedtime  - Novolog 12U with meals  - CBG Checks   #Anemia of Chronic Disease #Hx of Iron Deficiency Anemia Labs c/w inflammation/CKD Plan:  -  Monitor CBC  Diet: Renal IVF: None,None VTE: Heparin Code: Full PT/OT recs: Pending,. TOC recs:  Family Update:   Dispo: Anticipated discharge pending treatment of bacteremia, line exchange, infectious workup  Linus Galas, MD PGY-1 Internal Medicine Resident Please contact the on call pager after 5 pm and on weekends at [phone].

## 2022-07-18 NOTE — Progress Notes (Signed)
Nephrology Follow-Up Consult note   Assessment/Recommendations: Jeremy [name] is a/an 41 y.o. male with a past medical history significant for ESRD, admitted for bacteremia.       Outpatient Dialysis Orders:  Center: [facility]  on TTS. 180NRe 4 hours BFR 400 DFR 800 EDW 124kg 2K 2.5Ca TDC  Heparin 5000 unit bolus and 4000 unit bolus mid HD Mircera 28mcg IV q 4 weeks Venofer 50mg  weekly Hectorol 105mcg IV q HD   Assessment/Plan:  E faecalis bacteremia: Infectious disease on board, getting IV ampicillin. Sayre removed on 07/17/2022.  Plan for replacement of catheter tomorrow once blood cultures are negative for 48 hours  ESRD:  TTS schedule. Had HD on 11/8. HD cath now out and plan for HD next on Saturday after catheter replaced.  Hypertension/volume: Some opacities in BL lower lobes on chest CT. Suspect volume related. BP elevated. Continue home meds and will titrate volume down with HD. He has lost weight and will need a new EDW  Anemia: Hgb ~8. Last ESA dose given 06/26/22, will start aranesp here  Metabolic bone disease: Phos controlled, calcium at goal. Continue binders and hectorol.   Nutrition:  Albumin low, continue renal diet and protein supplements Back pain: seen in the ED for back pain this week. Work up per primary team T2DM: On insulin   Recommendations conveyed to primary service.    [facility] 07/18/2022 9:41 AM  ___________________________________________________________  CC: Bacteremia  Interval History/Subjective: Patient having some back pain today.  Hoping to go home as soon as possible.  Discussed need for tunneled dialysis catheter tomorrow if blood cultures are negative.  If they are positive he would need a temporary dialysis catheter.  Plan for dialysis tomorrow.   Medications:  Current Facility-Administered Medications  Medication Dose Route Frequency Provider Last Rate Last Admin   acetaminophen (TYLENOL) tablet 1,000  mg  1,000 mg Oral Q6H Nooruddin, Saad, MD   1,000 mg at 07/18/22 0629   amLODipine (NORVASC) tablet 10 mg  10 mg Oral Daily Farrel Gordon, DO   10 mg at 07/17/22 2232   ampicillin (OMNIPEN) 2 g in sodium chloride 0.9 % 100 mL IVPB  2 g Intravenous Q12H Nooruddin, Saad, MD 300 mL/hr at 07/18/22 0158 2 g at 07/18/22 0158   carvedilol (COREG) tablet 37.5 mg  37.5 mg Oral BID WC Katsadouros, Vasilios, MD   37.5 mg at 07/18/22 6270   ceFAZolin (ANCEF) IVPB 2g/100 mL premix  2 g Intravenous to [employer], Roselyn Reef R, NP       cefTRIAXone (ROCEPHIN) 2 g in sodium chloride 0.9 % 100 mL IVPB  2 g Intravenous Q12H Einar Grad, RPH 200 mL/hr at 07/17/22 2131 2 g at 07/17/22 2131   Chlorhexidine Gluconate Cloth 2 % PADS 6 each  6 each Topical Q0600 Janalee Dane, PA-C   6 each at 07/17/22 3500   cloNIDine (CATAPRES) tablet 0.3 mg  0.3 mg Oral BID Katsadouros, Vasilios, MD   0.3 mg at 07/17/22 2124   heparin injection 5,000 Units  5,000 Units Subcutaneous Q8H Katsadouros, Vasilios, MD   5,000 Units at 07/18/22 0630   hydrALAZINE (APRESOLINE) tablet 100 mg  100 mg Oral TID Farrel Gordon, DO   100 mg at 07/18/22 0833   insulin aspart (novoLOG) injection 12 Units  12 Units Subcutaneous TID WC Katsadouros, Vasilios, MD   12 Units at 07/17/22 1912   insulin glargine-yfgn (SEMGLEE) injection 25 Units  25 Units Subcutaneous QHS Katsadouros,  Vasilios, MD   25 Units at 07/17/22 0155   lidocaine (LIDODERM) 5 % 1 patch  1 patch Transdermal Q24H Linus Galas, MD   1 patch at 07/17/22 1518   methocarbamol (ROBAXIN) tablet 500 mg  500 mg Oral Q8H PRN Linus Galas, MD       Muscle Rub CREA   Topical PRN Katsadouros, Vasilios, MD       ondansetron (ZOFRAN) injection 4 mg  4 mg Intravenous Q8H PRN Linus Galas, MD   4 mg at 07/17/22 0811   ondansetron (ZOFRAN) tablet 4 mg  4 mg Oral Once PRN Sande Rives E, PA-C       sevelamer carbonate (RENVELA) tablet 1,600 mg  1,600 mg Oral TID WC  Katsadouros, Vasilios, MD   1,600 mg at 07/17/22 1828   sodium chloride flush (NS) 0.9 % injection 10-40 mL  10-40 mL Intracatheter Q12H Charise Killian, MD   10 mL at 07/17/22 2132   sodium chloride flush (NS) 0.9 % injection 10-40 mL  10-40 mL Intracatheter PRN Charise Killian, MD          Review of Systems: 10 systems reviewed and negative except per interval history/subjective  Physical Exam: Vitals:   07/18/22 0407 07/18/22 0823  BP: (!) 143/64 (!) 163/79  Pulse: 71 88  Resp: 18 18  Temp: 98.9 F (37.2 C) 98.3 F (36.8 C)  SpO2: 100% 96%   No intake/output data recorded.  Intake/Output Summary (Last 24 hours) at 07/18/2022 0941 Last data filed at 07/17/2022 1800 Gross per 24 hour  Intake 560 ml  Output --  Net 560 ml   Constitutional: well-appearing, no acute distress ENMT: ears and nose without scars or lesions, MMM CV: normal rate, no edema Respiratory: Bilateral chest rise, normal work of breathing Gastrointestinal: soft, non-tender, no palpable masses or hernias Skin: no visible lesions or rashes Psych: alert, judgement/insight appropriate, appropriate mood and affect   Test Results I personally reviewed new and old clinical labs and radiology tests Lab Results  Component Value Date   NA 134 (L) 07/18/2022   K 3.9 07/18/2022   CL 98 07/18/2022   CO2 22 07/18/2022   BUN 52 (H) 07/18/2022   CREATININE 9.76 (H) 07/18/2022   GFR 109.39 03/09/2015   CALCIUM 8.1 (L) 07/18/2022   ALBUMIN 2.2 (L) 07/18/2022   PHOS 4.6 07/18/2022    CBC Recent Labs  Lab 07/15/22 1315 07/16/22 0139 07/17/22 0712 07/18/22 0335  WBC 17.3* 12.3* 10.9* 9.6  NEUTROABS 15.9* 9.4*  --   --   HGB 10.6* 8.8* 9.2* 8.1*  HCT 34.0* 27.5* 29.2* 26.3*  MCV 86.3 86.5 83.9 86.8  PLT 359 320 312 288

## 2022-07-18 NOTE — Progress Notes (Signed)
Mobility Specialist: Progress Note   07/18/22 1111  Mobility  Activity Ambulated with assistance in hallway  Level of Assistance Contact guard assist, steadying assist  Assistive Device Front wheel walker  Distance Ambulated (ft) 250 ft  Activity Response Tolerated well  Mobility Referral Yes  $Mobility charge 1 Mobility   Pt received in the chair and agreeable to mobility. C/o 8/10 back pain during ambulation. No c/o dizziness or SOB. Cues for upright posture and to keep both hands on RW. Pt back to the chair after session with call bell and phone in reach.   [place] Jeremy [name] Mobility Specialist Please contact via SecureChat or Rehab office at [phone]

## 2022-07-18 NOTE — Progress Notes (Addendum)
ID Brief Note   Patient in procedure for TEE  Afebrile.  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 D/w Nephrology.  CT surgery consult  D/w IMTS  Dr Baxter Flattery covering this weekend. I am back Monday   [name], MD Infectious Disease Physician [facility] [address] [place], [place] [address] Phone: [phone]  Fax: [phone]
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
## 2022-07-18 NOTE — Consult Note (Signed)
Cardiology Consultation   Patient ID: Jeremy [name] MRN: [id number]; DOB: 1981-03-01  Admit date: 07/15/2022 Date of Consult: 07/18/2022  PCP:  Rick Duff, [place] Providers Cardiologist:  None        Patient Profile:   Jeremy [name] is a 41 y.o. male admitted 07/15/2022 with fever and infectious symptoms found to have Enterococcus faecalis bacteremia  History of Present Illness:   Jeremy [name] is a 41 year old gentleman with end-stage renal disease, dialyzed via a left AV fistula, morbid obesity, type 2 diabetes, and history of nonischemic cardiomyopathy.  He has initial blood cultures grew Enterococcus faecalis and he was felt to be at high risk of endocarditis with an indwelling right internal jugular catheter.  2D echo demonstrated an LVEF of 50 to 55%.  There was moderate aortic insufficiency and concern was raised about the possibility of a small vegetation on the aortic valve.  The patient underwent transesophageal echo this afternoon again demonstrating essentially normal LVEF of 55 to 60% and the presence of a vegetation seen on the noncoronary cusp of the aortic valve with severe aortic insufficiency.  Cardiology consultation is requested to help with management.  The patient reports that he presented with 3 days of drenching sweats and then he developed shaking chills during a dialysis session.  He also had associated shortness of breath and chest pain.  There is associated nausea and vomiting.  These are the symptoms that brought him to the hospital.  He is feeling much better now and states that his breathing is improved.  He has had no further chest pain.  He has no chills at present.  He has had an indwelling dialysis catheter without permanent access.  His dialysis catheter was removed after he was found to have bacteremia.  Past Medical History:  Diagnosis Date   Abscess of left groin    Acute blood loss anemia 11/11/2013   Anemia in chronic  kidney disease (CKD)    Asthma    Boil of scrotum 11/21/2015   Chest pain    a. 01/2015 Lexiscan MV: EF 28%, inferior, inferolateral, apical ischemia;  b. 01/2015 Cath: nl cors, PCWP 18 mmHg, CO 9.38 L/min, CI 3.53 L/min/m^2.   CHF (congestive heart failure) (HCC)    Depression    Situational   ESRD (end stage renal disease) ([place] [facility])    TTHS- [place]   Essential hypertension    Family history of adverse reaction to anesthesia    sister- "it was too much for her heart" died   GERD (gastroesophageal reflux disease)    Hyperlipidemia    Membranous glomerulonephritis    Morbid obesity ([place])    Nonischemic cardiomyopathy ([place])    a. 01/2015 Echo: EF 20-25%, diff HK, Gr 2 DD, Triv AI, mildly dil LA and Ao root.   PONV (postoperative nausea and vomiting)    Seizures ([place]) 02/29/2020   "electralytes were out of wack."   Type II diabetes mellitus ([place])    a. 01/2015 HbA1c = 8.9.    Past Surgical History:  Procedure Laterality Date   A/V FISTULAGRAM N/A 08/12/2021   Procedure: A/V FISTULAGRAM;  Surgeon: Waynetta Sandy, MD;  Location: [facility];  Service: Cardiovascular;  Laterality: N/A;   AV FISTULA PLACEMENT Right 12/18/2020   Procedure: RIGHT ARM RADIOCEPHALIC  ARTERIOVENOUS (AV) FISTULA CREATION;  Surgeon: Waynetta Sandy, MD;  Location: [facility];  Service: Vascular;  Laterality: Right;   CARDIAC CATHETERIZATION N/A 02/01/2015  Procedure: Right/Left Heart Cath and Coronary Angiography;  Surgeon: Josue Hector, MD;  Location: [facility];  Service: Cardiovascular;  Laterality: N/A;   IR FLUORO GUIDE CV LINE RIGHT  10/11/2019   IR REMOVAL TUN CV CATH W/O FL  07/17/2022   IR US GUIDE VASC ACCESS RIGHT  10/11/2019   THORACOTOMY Left 11/08/2013   Procedure: LEFT THORACOTOMY;  Surgeon: Ivin Poot, MD;  Location: [facility];  Service: Thoracic;  Laterality: Left;     Home Medications:  Prior to Admission medications   Medication Sig Start Date End Date Taking?  Authorizing Provider  acetaminophen (TYLENOL) 500 MG tablet Take 1,000 mg by mouth every 6 (six) hours as needed for moderate pain.   Yes [provider]  amLODipine (NORVASC) 10 MG tablet Take 1 tablet (10 mg total) by mouth daily. 01/10/22  Yes Axel Filler, MD  atorvastatin (LIPITOR) 80 MG tablet Take 1 tablet (80 mg total) by mouth daily. NEEDS FOLLOW UP APPOINTMENT FOR ANYMORE REFILLS Patient taking differently: Take 80 mg by mouth daily. 05/08/22  Yes Bensimhon, Shaune Pascal, MD  AURYXIA 1 GM 210 MG(Fe) tablet Take 420 mg by mouth 3 (three) times daily. 08/05/21  Yes [provider]  carvedilol (COREG) 25 MG tablet Take 1.5 tablets (37.5 mg total) by mouth 2 (two) times daily with a meal. Need to schedule an appointment for further refills 05/05/22  Yes [name], MD  cloNIDine (CATAPRES) 0.3 MG tablet Take 1 tablet (0.3 mg total) by mouth 2 (two) times daily. 04/10/21  Yes [name], MD  Doxercalciferol (HECTOROL IV) Inject 1 Dose into the vein Every Tuesday,Thursday,and Saturday with dialysis. 06/05/22 06/04/23 Yes [provider]  glipiZIDE (GLUCOTROL) 5 MG tablet Take 1 tablet (5 mg total) by mouth daily before breakfast. 03/07/22  Yes Amponsah, Charisse March, MD  hydrALAZINE (APRESOLINE) 100 MG tablet Take 1 tablet (100 mg total) by mouth 3 (three) times daily. 01/31/22  Yes Bensimhon, Shaune Pascal, MD  ibuprofen (ADVIL) 200 MG tablet Take 400 mg by mouth every 6 (six) hours as needed for moderate pain.   Yes [provider]  insulin aspart (NOVOLOG FLEXPEN) 100 UNIT/ML FlexPen Inject 15 Units into the skin 3 (three) times daily with meals. Patient taking differently: Inject 17 Units into the skin 3 (three) times daily with meals. 03/25/22 07/15/22 Yes Masters, Katie, DO  insulin glargine (LANTUS) 100 unit/mL SOPN Inject 37 Units into the skin at bedtime. 10/07/21 07/15/22 Yes Gaylan Gerold, DO  lidocaine (LIDODERM) 5 % Place 1 patch onto the skin daily.  Remove and Discard patch within 12 hours or as directed. 07/09/22  Yes Prosperi, Christian H, PA-C  methocarbamol (ROBAXIN) 500 MG tablet Take 1 tablet (500 mg total) by mouth 2 (two) times daily. 07/09/22  Yes Prosperi, Christian H, PA-C  multivitamin (RENA-VIT) TABS tablet Take 1 tablet by mouth daily.   Yes [provider]  nitroGLYCERIN (NITROSTAT) 0.4 MG SL tablet PLACE 1 TABLET UNDER THE TONGUE EVERY 5 MINUTES AS NEEDED FOR CHEST PAIN. Patient taking differently: Place 0.4 mg under the tongue every 5 (five) minutes as needed for chest pain. 06/28/19  Yes [name], MD  ondansetron (ZOFRAN-ODT) 4 MG disintegrating tablet Take 1 tablet (4 mg total) by mouth every 8 (eight) hours as needed for nausea or vomiting. 07/02/21  Yes Smoot, Leary Roca, PA-C  RENVELA 800 MG tablet Take 1,600 mg by mouth 3 (three) times daily with meals. 07/28/21  Yes [provider]  UNABLE TO FIND Inject 1 Dose into the vein Every Tuesday,Thursday,and Saturday with dialysis. Med Name: VIT D injection/IV.   Yes [provider]  Accu-Chek Softclix Lancets lancets Use as directed to check blood sugar four times daily. 02/24/22   Rick Duff, MD  blood glucose meter kit and supplies KIT Dispense based on patient and insurance preference. Use up to four times daily as directed. (FOR ICD-9 250.00, 250.01). 07/21/18   Katherine Roan, MD  Blood Glucose Monitoring Suppl (ACCU-CHEK GUIDE) w/Device KIT Use to test blood sugar 02/24/22   Rick Duff, MD  Blood Pressure Monitoring (FORA P20 BLOOD PRESSURE CUFF) MISC Check your blood pressure everyday in the morning. 02/24/22   Rick Duff, MD  cloNIDine (CATAPRES) 0.3 MG tablet Take 1 tablet (0.3 mg total) by mouth 2 (two) times daily as directed 08/21/21     ferric citrate (AURYXIA) 1 GM 210 MG(Fe) tablet Take 2 tablets (420 mg total) by mouth as directed with meals & 2 with snacks Patient not taking: Reported on 07/15/2022 02/06/22      ferric citrate (AURYXIA) 1 GM 210 MG(Fe) tablet Take 2 tablets (420 mg total) by mouth 3 (three) times daily with meals, AND 2 tablets (420 mg total) 2 (two) times daily with snacks Patient not taking: Reported on 07/15/2022 03/19/22     glucose blood (ACCU-CHEK GUIDE) test strip USE TO CHECK BLOOD SUGAR 4 TIMES DAILY 02/24/22   Rick Duff, MD  Insulin Pen Needle (PEN NEEDLES) 31G X 5 MM MISC use four times daily with insulin 10/07/21 01/05/22  Gaylan Gerold, DO  Insulin Syringe-Needle U-100 (ULTICARE INSULIN SYRINGE) 30G X 1/2" 0.5 ML MISC USE 4 TIMES DAILY WITH INSULIN INJECTIONS. 10/07/21 10/07/22  Iona Beard, MD    Inpatient Medications: Scheduled Meds:  acetaminophen  1,000 mg Oral Q6H   amLODipine  10 mg Oral Daily   carvedilol  37.5 mg Oral BID WC   Chlorhexidine Gluconate Cloth  6 each Topical Q0600   cloNIDine  0.3 mg Oral BID   heparin  5,000 Units Subcutaneous Q8H   hydrALAZINE  100 mg Oral TID   insulin aspart  12 Units Subcutaneous TID WC   insulin glargine-yfgn  25 Units Subcutaneous QHS   lidocaine  1 patch Transdermal Q24H   sevelamer carbonate  1,600 mg Oral TID WC   sodium chloride flush  10-40 mL Intracatheter Q12H   Continuous Infusions:  ampicillin (OMNIPEN) IV 2 g (07/18/22 1626)   cefTRIAXone (ROCEPHIN)  IV Stopped (07/18/22 1130)   PRN Meds: methocarbamol, Muscle Rub, ondansetron (ZOFRAN) IV, ondansetron, sodium chloride flush  Allergies:    Allergies  Allergen Reactions   Acyclovir And Related Hives    Social History:   Social History   Socioeconomic History   Marital status: Single    Spouse name: Not on file   Number of children: Not on file   Years of education: Not on file   Highest education level: Some college, no degree  Occupational History   Not on file  Tobacco Use   Smoking status: Never   Smokeless tobacco: Never  Vaping Use   Vaping Use: Never used  Substance and Sexual Activity   Alcohol use: No    Alcohol/week: 0.0  standard drinks of alcohol   Drug use: Yes    Types: Marijuana    Comment: last used 09/15/2020   Sexual activity: Yes    Partners: Female  Other Topics Concern  Not on file  Social History Narrative   Lives in [place] by himself.  Currently in [facility].   Caffeine- none       Social Determinants of Health   Financial Resource Strain: Not on file  Food Insecurity: No Food Insecurity (07/16/2022)   Hunger Vital Sign    Worried About Running Out of Food in the Last Year: Never true    Ran Out of Food in the Last Year: Never true  Transportation Needs: No Transportation Needs (07/16/2022)   PRAPARE - Hydrologist (Medical): No    Lack of Transportation (Non-Medical): No  Physical Activity: Insufficiently Active (11/27/2021)   Exercise Vital Sign    Days of Exercise per Week: 2 days    Minutes of Exercise per Session: 30 min  Stress: Stress Concern Present (11/27/2021)   [place]    Feeling of Stress : To some extent  Social Connections: Not on file  Intimate Partner Violence: Not At Risk (07/16/2022)   Humiliation, Afraid, Rape, and Kick questionnaire    Fear of Current or Ex-Partner: No    Emotionally Abused: No    Physically Abused: No    Sexually Abused: No    Family History:   Family History  Problem Relation Age of Onset   Diabetes Mellitus II Mother        died @ 48.   Gastric cancer Mother    CAD Father        died @ 30.   Heart attack Father    Congestive Heart Failure Father    Diabetes Mellitus II Sister    CAD Sister        s/p PCI - age 32.     ROS:  Please see the history of present illness.  All other ROS reviewed and negative.     Physical Exam/Data:   Vitals:   07/18/22 1440 07/18/22 1450 07/18/22 1500 07/18/22 1520  BP: 129/69 (!) 139/57 (!) 161/84 (!) 157/82  Pulse: 82 80 83 79  Resp: 15 20 (!) 21 18  Temp:    98 F (36.7 C)  TempSrc:    Oral   SpO2: 96% 98% 98% 100%  Weight:      Height:        Intake/Output Summary (Last 24 hours) at 07/18/2022 1804 Last data filed at 07/18/2022 1452 Gross per 24 hour  Intake 350 ml  Output --  Net 350 ml      07/18/2022   12:00 PM 07/15/2022    1:21 PM 07/03/2022    1:23 PM  Last 3 Weights  Weight (lbs) 265 lb 10.5 oz 260 lb 260 lb  Weight (kg) 120.5 kg 117.935 kg 117.935 kg     Body mass index is 31.5 kg/m.  General:  Well nourished, well developed, in no acute distress HEENT: normal Neck: no JVD Vascular: No carotid bruits; Distal pulses 2+ bilaterally Cardiac:  normal S1, S2; tachycardic and regular with a 2/6 systolic murmur at the right upper sternal border and a 2/6 early diastolic murmur also at the right upper sternal border Lungs:  clear to auscultation bilaterally, no wheezing, rhonchi or rales  Abd: soft, nontender, no hepatomegaly  Ext: no edema Musculoskeletal:  No deformities, BUE and BLE strength normal and equal Skin: warm and dry  Neuro:  CNs 2-12 intact, no focal abnormalities noted Psych:  Normal affect   EKG:  The EKG was personally  reviewed and demonstrates: Sinus tachycardia with left axis deviation and poor R wave progression cannot rule out age-indeterminate anterior infarct    Relevant CV Studies: Transesophageal echo: 1. Left ventricular ejection fraction, by estimation, is 55 to 60%. The  left ventricle has normal function.   2. Right ventricular systolic function is normal. The right ventricular  size is normal.   3. No left atrial/left atrial appendage thrombus was detected.   4. 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   5. The mitral valve is normal in structure. No evidence of mitral valve  regurgitation.   6. Small defect with non central AI jet concerning for perforation of the  [place] approx. 0.2 cm. Small vegetation seen on the [place] (image 59). aortic  valve is sclerotic with mild calcification. Central AI jet with VC ~0.67  cm and holodiastolic  flow reversal   (image 56) was seen in the descending aorta. PHT 264 ms. Concerning for  borderline severe AI.   7. No aortic root abscess.   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   Laboratory Data:  High Sensitivity Troponin:   Recent Labs  Lab 07/15/22 1228 07/15/22 1315  TROPONINIHS 256* 213*     Chemistry Recent Labs  Lab 07/16/22 0139 07/17/22 0712 2022/07/31 0335  NA 136 134* 134*  K 4.2 3.4* 3.9  CL 106 99 98  CO2 19* 23 22  GLUCOSE 272* 144* 160*  BUN 63* 42* 52*  CREATININE 9.83* 7.41* 9.76*  CALCIUM 8.3* 8.6* 8.1*  GFRNONAA 6* 9* 6*  ANIONGAP _0 Recent Labs  Lab 07/15/22 1315 07/16/22 0139 07/17/22 0712 07/31/22 0335  PROT 7.7  --   --   --   ALBUMIN 2.8* 2.4* 2.5* 2.2*  AST 25  --   --   --   ALT 26  --   --   --   ALKPHOS 55  --   --   --   BILITOT 0.8  --   --   --    Lipids No results for input(s): "CHOL", "TRIG", "HDL", "LABVLDL", "LDLCALC", "CHOLHDL" in the last 168 hours.  Hematology Recent Labs  Lab 07/16/22 0139 07/17/22 0712 07/31/2022 0335  WBC 12.3* 10.9* 9.6  RBC 3.18* 3.48* 3.03*  HGB 8.8* 9.2* 8.1*  HCT 27.5* 29.2* 26.3*  MCV 86.5 83.9 86.8  MCH 27.7 26.4 26.7  MCHC 32.0 31.5 30.8  RDW 15.9* 15.8* 15.9*  PLT 320 312 288   Thyroid No results for input(s): "TSH", "FREET4" in the last 168 hours.  BNPNo results for input(s): "BNP", "PROBNP" in the last 168 hours.  DDimer No results for input(s): "DDIMER" in the last 168 hours.   Radiology/Studies:  ECHO TEE  Result Date: 07-31-2022    TRANSESOPHOGEAL ECHO REPORT   Patient Name:   NEWMAN WAREN Date of Exam: 2022/07/31 Medical Rec #:  [id number]       Height:       77.0 in Accession #:    [id number]      Weight:       265.7 lb Date of Birth:  [birth year]       BSA:          2.524 m Patient Age:    52 years        BP:           140/79 mmHg Patient Gender: M  HR:           74 bpm. Exam  Location:  [facility] Procedure: Transesophageal Echo, Cardiac Doppler, Limited Color Doppler and 3D            Echo Indications:     Bacterimea  History:         Patient has prior history of Echocardiogram examinations, most                  recent 07/16/2022. Cardiomyopathy, Signs/Symptoms:Chest Pain;                  Risk Factors:Hypertension, Diabetes, Dyslipidemia and                  Non-Smoker.  Sonographer:     Greer Pickerel Sonographer#2:   Darlina Sicilian RDCS Referring Phys:  [id number] [name] Diagnosing Phys: [place] PROCEDURE: After discussion of the risks and benefits of a TEE, an informed consent was obtained from the patient. The transesophogeal probe was passed without difficulty through the esophogus of the patient. Imaged were obtained with the patient in a left lateral decubitus position. Sedation performed by different physician. The patient's vital signs; including heart rate, blood pressure, and oxygen saturation; remained stable throughout the procedure.  IMPRESSIONS  1. Left ventricular ejection fraction, by estimation, is 55 to 60%. The left ventricle has normal function.  2. Right ventricular systolic function is normal. The right ventricular size is normal.  3. No left atrial/left atrial appendage thrombus was detected.  4. 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  5. The mitral valve is normal in structure. No evidence of mitral valve regurgitation.  6. Small defect with non central AI jet concerning for perforation of the [place] approx. 0.2 cm. Small vegetation seen on the [place] (image 59). aortic valve is sclerotic with mild calcification. Central AI jet with VC ~0.67 cm and holodiastolic flow reversal  (image 56) was seen in the descending aorta. PHT 264 ms. Concerning for borderline severe AI.  7. No aortic root abscess. 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 FINDINGS  Left Ventricle: Left ventricular ejection fraction,  by estimation, is 55 to 60%. The left ventricle has normal function. The left ventricular internal cavity size was normal in size. Right Ventricle: The right ventricular size is normal. Right ventricular systolic function is normal. Left Atrium: Left atrial size was normal in size. No left atrial/left atrial appendage thrombus was detected. Right Atrium: Right atrial size was normal in size. 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 Pericardium: There is no evidence of pericardial effusion. Mitral Valve: The mitral valve is normal in structure. No evidence of mitral valve regurgitation. Tricuspid Valve: The tricuspid valve is normal in structure. Tricuspid valve regurgitation is not demonstrated. Aortic Valve: Small defect with non central AI jet concerning for perforation of the NCC approx. 0.2 cm. Small vegetation seen on the [place] (image 59). aortic valve is sclerotic with mild calcification. Central AI jet with VC ~0.67 cm and holodiastolic flow reversal (image 56) was seen in the descending aorta. PHT 264 ms. Concerning for borderline severe AI. Aortic regurgitation PHT measures 264 msec. Pulmonic Valve: The pulmonic valve was normal in structure. Pulmonic valve regurgitation is not visualized. Aorta: No aortic root abscess. IAS/Shunts: No atrial level shunt detected by color flow Doppler. Additional Comments: Spectral Doppler performed. AORTIC VALVE AI PHT:      264 msec Phineas Inches Electronically signed by Phineas Inches Signature Date/Time: 07/18/2022/3:20:15 PM  Final    US RENAL  Result Date: 07/17/2022 CLINICAL DATA:  Evaluate for renal cyst. EXAM: RENAL / URINARY TRACT ULTRASOUND COMPLETE COMPARISON:  07/15/2022 FINDINGS: Right Kidney: Renal measurements: 11.2 x 5.9 x 5.6 cm = volume: 194.8 mL. Echogenicity within normal limits. No mass or hydronephrosis visualized. Left Kidney: Renal measurements: 11.9 x 6.2 x 5.7 cm = volume: 220.5 mL. Anechoic cyst with increased through transmission is identified arising off  the interpolar left kidney measuring 3.4 x 2.5 x 2.8 cm. The additional small low density lesions within the inferior pole of the left kidney are not confidently identified on current exam and may be too small to visualize via ultrasound. No suspicious kidney mass identified. Bladder: Appears normal for degree of bladder distention. Other: None. IMPRESSION: 1. No acute findings. 2. Simple appearing left renal cyst. The additional small low density lesions within the inferior pole of the left kidney are not confidently identified on current exam and may be too small to visualize via ultrasound. No suspicious kidney mass identified. Electronically Signed   By: Kerby Moors M.D.   On: 07/17/2022 15:23   IR Removal Tun Cv Cath W/O FL  Result Date: 07/17/2022 INDICATION: Patient with a tunneled dialysis catheter placed by vascular team. Patient now with bacteremia. Interventional radiology asked to remove tunneled dialysis catheter for line holiday. EXAM: REMOVAL TUNNELED CENTRAL VENOUS CATHETER MEDICATIONS: 1% lidocaine 10 mL ANESTHESIA/SEDATION: None FLUOROSCOPY: None COMPLICATIONS: None immediate. PROCEDURE: Informed written consent was obtained from the patient after a thorough discussion of the procedural risks, benefits and alternatives. All questions were addressed. Maximal Sterile Barrier Technique was utilized including caps, mask, sterile gowns, sterile gloves, sterile drape, hand hygiene and skin antiseptic. A timeout was performed prior to the initiation of the procedure. The patient's left chest and catheter was prepped and draped in a normal sterile fashion. Heparin was removed from both ports of catheter. 1% lidocaine was used for local anesthesia. Using gentle blunt dissection and moderate manual traction the cuff of the catheter was exposed and the catheter was removed in it's entirety. Pressure was held till hemostasis was obtained. A sterile dressing was applied. The patient tolerated the  procedure well with no immediate complications. IMPRESSION: Successful catheter removal as described above. Read by: Soyla Dryer, NP Electronically Signed   By: Miachel Roux M.D.   On: 07/17/2022 11:00   ECHOCARDIOGRAM COMPLETE  Result Date: 07/16/2022    ECHOCARDIOGRAM REPORT   Patient Name:   Jeremy [name] Date of Exam: 07/16/2022 Medical Rec #:  [id number]       Height:       77.0 in Accession #:    [id number]      Weight:       260.0 lb Date of Birth:  04-Mar-1981       BSA:          2.501 m Patient Age:    39 years        BP:           144/74 mmHg Patient Gender: M               HR:           90 bpm. Exam Location:  Inpatient Procedure: 2D Echo, Cardiac Doppler and Color Doppler Indications:    Bacteremia  History:        Patient has prior history of Echocardiogram examinations, most  recent 06/28/2019. Cardiomyopathy, Signs/Symptoms:Chest Pain;                 Risk Factors:Hypertension, Diabetes and Dyslipidemia. Kidney                 disorder. Dialysis catheter.  Sonographer:    Roseanna Rainbow RDCS Referring Phys: [id number] [place]  1. Left ventricular ejection fraction, by estimation, is 50 to 55%. The left ventricle has low normal function. The left ventricle has no regional wall motion abnormalities. There is mild concentric left ventricular hypertrophy. Left ventricular diastolic parameters are consistent with Grade II diastolic dysfunction (pseudonormalization).  2. Right ventricular systolic function is normal. The right ventricular size is normal. Tricuspid regurgitation signal is inadequate for assessing PA pressure.  3. The mitral valve is normal in structure. Trivial mitral valve regurgitation. No evidence of mitral stenosis.  4. Dialysis catheter tip in right atrium.  5. The aortic valve is tricuspid. Aortic valve regurgitation is moderate. No aortic stenosis is present. Cannot fully rule out small vegetation on aortic valve. Suggest TEE to assess the aortic valve.  6.  Aortic dilatation noted. There is mild dilatation of the ascending aorta and of the aortic root, measuring 40 mm.  7. The inferior vena cava is dilated in size with >50% respiratory variability, suggesting right atrial pressure of 8 mmHg. FINDINGS  Left Ventricle: Left ventricular ejection fraction, by estimation, is 50 to 55%. The left ventricle has low normal function. The left ventricle has no regional wall motion abnormalities. The left ventricular internal cavity size was normal in size. There is mild concentric left ventricular hypertrophy. Left ventricular diastolic parameters are consistent with Grade II diastolic dysfunction (pseudonormalization). Right Ventricle: The right ventricular size is normal. No increase in right ventricular wall thickness. Right ventricular systolic function is normal. Tricuspid regurgitation signal is inadequate for assessing PA pressure. Left Atrium: Left atrial size was normal in size. Right Atrium: Dialysis catheter tip in right atrium. Right atrial size was normal in size. Pericardium: There is no evidence of pericardial effusion. Mitral Valve: The mitral valve is normal in structure. Trivial mitral valve regurgitation. No evidence of mitral valve stenosis. Tricuspid Valve: The tricuspid valve is normal in structure. Tricuspid valve regurgitation is not demonstrated. Aortic Valve: The aortic valve is tricuspid. Aortic valve regurgitation is moderate. Aortic regurgitation PHT measures 389 msec. No aortic stenosis is present. Pulmonic Valve: The pulmonic valve was normal in structure. Pulmonic valve regurgitation is not visualized. Aorta: Aortic dilatation noted. There is mild dilatation of the ascending aorta and of the aortic root, measuring 40 mm. Venous: The inferior vena cava is dilated in size with greater than 50% respiratory variability, suggesting right atrial pressure of 8 mmHg. IAS/Shunts: No atrial level shunt detected by color flow Doppler.  LEFT VENTRICLE PLAX 2D  LVIDd:         5.60 cm LVIDs:         4.00 cm LV PW:         1.40 cm LV IVS:        1.20 cm LVOT diam:     2.80 cm LV SV:         145 LV SV Index:   58 LVOT Area:     6.16 cm  LV Volumes (MOD) LV vol d, MOD A2C: 198.0 ml LV vol d, MOD A4C: 206.5 ml LV vol s, MOD A2C: 84.2 ml LV vol s, MOD A4C: 91.5 ml LV SV MOD A2C:  113.8 ml LV SV MOD A4C:     206.5 ml LV SV MOD BP:      119.6 ml RIGHT VENTRICLE             IVC RV S prime:     14.00 cm/s  IVC diam: 2.40 cm TAPSE (M-mode): 2.0 cm LEFT ATRIUM             Index        RIGHT ATRIUM          Index LA diam:        4.00 cm 1.60 cm/m   RA Area:     7.04 cm LA Vol (A2C):   70.5 ml 28.19 ml/m  RA Volume:   10.90 ml 4.36 ml/m LA Vol (A4C):   74.2 ml 29.67 ml/m LA Biplane Vol: 79.8 ml 31.91 ml/m  AORTIC VALVE LVOT Vmax:   127.00 cm/s LVOT Vmean:  81.800 cm/s LVOT VTI:    0.236 m AI PHT:      389 msec  AORTA Ao Root diam: 3.90 cm Ao Asc diam:  3.80 cm MITRAL VALVE MV Area (PHT): 4.43 cm    SHUNTS MV Decel Time: 171 msec    Systemic VTI:  0.24 m MV E velocity: 79.13 cm/s  Systemic Diam: 2.80 cm MV A velocity: 87.47 cm/s MV E/A ratio:  0.90 Dalton McleanMD Electronically signed by Franki Monte Signature Date/Time: 07/16/2022/5:14:52 PM    Final    MR Lumbar Spine W Wo Contrast  Result Date: 07/15/2022 CLINICAL DATA:  Low back pain, infection suspected EXAM: MRI LUMBAR SPINE WITHOUT AND WITH CONTRAST TECHNIQUE: Multiplanar and multiecho pulse sequences of the lumbar spine were obtained without and with intravenous contrast. CONTRAST:  34m GADAVIST GADOBUTROL 1 MMOL/ML IV SOLN COMPARISON:  No prior MRI, correlation is made with CT lumbar spine 07/15/2022 FINDINGS: Segmentation:  Standard. Alignment: Straightening of the normal lumbar lordosis. No listhesis. Vertebrae: No acute fracture or suspicious osseous lesion. Increased T2 signal and minimal contrast enhancement at the anterior endplates about LC1-Y6 without definite abnormal signal in the disc space.  Congenitally short pedicles, which narrow the AP diameter of the spinal canal. Conus medullaris and cauda equina: Conus extends to the T12-L1 level. Conus and cauda equina appear normal. Paraspinal and other soft tissues: Negative. Disc levels: T12-L1: No significant disc bulge. Mild facet arthropathy. No spinal canal stenosis. No neural foraminal narrowing. L1-L2: No significant disc bulge. No spinal canal stenosis or neural foraminal narrowing. L2-L3: Minimal disc bulge. Mild facet arthropathy. No spinal canal stenosis. No neural foraminal narrowing. L3-L4: Minimal disc bulge. Mild facet arthropathy. Mild spinal canal stenosis. Narrowing of the lateral recesses. No neural foraminal narrowing. L4-L5: Mild disc bulge. Mild facet arthropathy. No spinal canal stenosis. Narrowing of the lateral recesses. No neural foraminal narrowing. L5-S1: Minimal disc bulge. Mild facet arthropathy. No spinal canal stenosis. No neural foraminal narrowing. IMPRESSION: 1. L3-L4 mild spinal canal stenosis. Narrowing of the lateral recesses at this level could affect the descending L4 nerve roots. 2. Narrowing of the lateral recesses at L4-L5 could affect the descending L5 nerve roots. 3. No neural foraminal narrowing. 4. Edema in the anterior endplates about the LA6-T0disc space, without definite abnormal signal in the disc, favored to be degenerative, although early discitis osteomyelitis can appear similar. Correlate with lab values and consider short interval follow-up. Electronically Signed   By: AMerilyn BabaM.D.   On: 07/15/2022 19:13   CT Angio Chest PE W and/or Wo Contrast  Result Date: 07/15/2022 CLINICAL DATA:  Pulmonary embolism suspected. 150 mL of Omnipaque 350. EXAM: CT ANGIOGRAPHY CHEST WITH CONTRAST TECHNIQUE: Multidetector CT imaging of the chest was performed using the standard protocol during bolus administration of intravenous contrast. Multiplanar CT image reconstructions and MIPs were obtained to evaluate the  vascular anatomy. RADIATION DOSE REDUCTION: This exam was performed according to the departmental dose-optimization program which includes automated exposure control, adjustment of the mA and/or kV according to patient size and/or use of iterative reconstruction technique. CONTRAST:  149m OMNIPAQUE IOHEXOL 350 MG/ML SOLN COMPARISON:  None Available. FINDINGS: Cardiovascular: Satisfactory opacification of the pulmonary arteries to the segmental level. No evidence of pulmonary embolism. Normal heart size. No pericardial effusion. Left IJ access double-lumen catheter with distal tip in the right atrium. Mediastinum/Nodes: No enlarged mediastinal, hilar, or axillary lymph nodes. Thyroid gland, trachea, and esophagus demonstrate no significant findings. Lungs/Pleura: Ground-glass attenuation of the lung parenchyma prominent in the dependent portions of bilateral lower lobes concerning for airspace disease. Upper Abdomen: Cholelithiasis without evidence of acute cholecystitis. Musculoskeletal: Cortical irregularity of the left fifth rib posteriorly, likely a chronic fracture. Mild multilevel degenerate disc disease of the thoracic spine. Review of the MIP images confirms the above findings. IMPRESSION: 1. No evidence of pulmonary embolism. 2. Ground-glass attenuation of the lung parenchyma prominent in the dependent portions of bilateral lower lobes concerning for airspace disease. 3. Cholelithiasis without evidence of acute cholecystitis. 4. Cortical irregularity of the left fifth rib posteriorly, likely a chronic fracture. Correlate with localized pain. Electronically Signed   By: IKeane PoliceD.O.   On: 07/15/2022 16:57   CT L-SPINE NO CHARGE  Result Date: 07/15/2022 CLINICAL DATA:  Cough, abdominal pain/back pain, right-sided leg pain. EXAM: CT LUMBAR SPINE WITHOUT CONTRAST TECHNIQUE: Multidetector CT imaging of the lumbar spine was performed without intravenous contrast administration. Multiplanar CT image  reconstructions were also generated. RADIATION DOSE REDUCTION: This exam was performed according to the departmental dose-optimization program which includes automated exposure control, adjustment of the mA and/or kV according to patient size and/or use of iterative reconstruction technique. COMPARISON:  Radiographs 07/09/2022 FINDINGS: Segmentation: The lowest lumbar type non-rib-bearing vertebra is labeled as L5. Alignment: No vertebral subluxation is observed. Vertebrae: Congenitally short pedicles in the lumbar spine. Schmorl's nodes and endplate sclerosis at LL7-9with some vacuum disc phenomenon loss of intervertebral disc height at the L4-5 level. Similar findings were also present on the CT pelvis from/25/16. No lumbar spine fracture or acute bony findings. Paraspinal and other soft tissues: No significant paraspinal edema. Otherwise please see dedicated CT abdomen report. Disc levels: T12-L1: Mild bilateral foraminal impingement due to short pedicles. L1-2: No impingement. L2-3: Mild bilateral foraminal stenosis and mild central narrowing of the thecal sac due to short pedicles and disc bulge. Faint calcification along the posterior annulus fibrosis of the intervertebral disc. L3-4: Moderate central narrowing of the thecal sac and mild to moderate bilateral foraminal stenosis due to disc bulge and short pedicles. L4-5: Moderate central narrowing of the thecal sac and mild bilateral foraminal stenosis due to disc bulge and short pedicles. Calcification along the posterior annulus fibrosis of the intervertebral disc. L5-S1: Mild to moderate bilateral foraminal stenosis and borderline central narrowing of the thecal sac due to short pedicles and degenerative facet arthropathy. Bilateral facet sclerosis is present, right greater than left. No impingement is identified along the sacral plexus. IMPRESSION: 1. Lumbar spondylosis, congenitally short pedicles, and degenerative disc disease, causing moderate  impingement at L3-4 and L4-5; mild  to moderate impingement at L5-S1; and mild impingement at T12-L1 and L2-3. Electronically Signed   By: Van Clines M.D.   On: 07/15/2022 16:50   CT ABDOMEN PELVIS W CONTRAST  Result Date: 07/15/2022 CLINICAL DATA:  Acute generalized abdominal pain, sepsis. EXAM: CT ABDOMEN AND PELVIS WITH CONTRAST TECHNIQUE: Multidetector CT imaging of the abdomen and pelvis was performed using the standard protocol following bolus administration of intravenous contrast. RADIATION DOSE REDUCTION: This exam was performed according to the departmental dose-optimization program which includes automated exposure control, adjustment of the mA and/or kV according to patient size and/or use of iterative reconstruction technique. CONTRAST:  120m OMNIPAQUE IOHEXOL 350 MG/ML SOLN COMPARISON:  Jan 31, 2015.  October 09, 2019. FINDINGS: Lower chest: Mild left basilar atelectasis or infiltrate is noted. Hepatobiliary: Mild cholelithiasis is noted. No biliary dilatation is noted. No focal hepatic abnormality is noted. Pancreas: Unremarkable. No pancreatic ductal dilatation or surrounding inflammatory changes. Spleen: Normal in size without focal abnormality. Adrenals/Urinary Tract: Right adrenal gland appears normal. 2.1 cm left adrenal nodule is noted with average Hounsfield measurement of 38. No hydronephrosis or renal obstruction is noted. 2.8 cm cyst seen in upper pole of left kidney. Two smaller low densities are noted in lower pole which may represent cysts, but renal ultrasound is recommended for confirmation. Urinary bladder is unremarkable. Stomach/Bowel: Stomach is within normal limits. Appendix appears normal. No evidence of bowel wall thickening, distention, or inflammatory changes. Vascular/Lymphatic: No significant vascular findings are present. No enlarged abdominal or pelvic lymph nodes. Reproductive: Prostate is unremarkable. Other: No abdominal wall hernia or abnormality. No  abdominopelvic ascites. Musculoskeletal: No acute or significant osseous findings. IMPRESSION: Mild left basilar atelectasis or infiltrate is noted. Mild cholelithiasis without evidence of cholecystitis. 2.1 cm left adrenal nodule is noted. Follow-up CT scan or MRI in 12 months is recommended to ensure stability and rule out neoplasm. 2.8 cm simple cyst is seen in upper pole of left kidney. Two smaller low densities are noted in the lower pole of left kidney which may represent cyst, but renal ultrasound is recommended for confirmation and to rule out neoplasm. Electronically Signed   By: JMarijo ConceptionM.D.   On: 07/15/2022 16:50   DG Chest 2 View  Result Date: 07/15/2022 CLINICAL DATA:  Suspected sepsis.  Fever. EXAM: CHEST - 2 VIEW COMPARISON:  07/02/2021 FINDINGS: Central line placed from the left has its tip in the proximal right atrium. Heart size is normal. Mildly tortuous aorta. The lungs are clear. No effusions. Old left-sided rib fracture and mild curvature of the spine as seen previously. IMPRESSION: No active cardiopulmonary disease. Central line tip in the proximal right atrium. Electronically Signed   By: MNelson ChimesM.D.   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  ID team following the patient.  His dialysis catheter has been removed.  He is treated with ampicillin and ceftriaxone.  He does not appear to be in acute heart failure.  He is not hemodynamically unstable and continues to require multiple medications for treatment of severe hypertension.  I suspect the patient will require aortic valve replacement due to his severe aortic valve insufficiency.  The patient is being seen by cardiac surgery (Dr. VDarcey Nora and is undergoing early evaluation for valve replacement.  We discussed considerations around coronary assessment including catheter angiography or gated coronary CTA.  Because of the presence of aortic valve  endocarditis, it would be  optimal to image his coronaries without invasive catheterization if possible.  He has had widely patent coronaries in the past when he underwent catheterization for evaluation of cardiomyopathy in 2016.  We will try to assess him with a gated coronary CTA and I will schedule this for Monday.  As part of his evaluation today, TEE images are personally reviewed.  Available imaging and lab data is also reviewed.  Again, the patient appears hemodynamically stable at present and will continue with medical therapy as guided by the infectious disease team.  We will arrange for a coronary CTA on Monday.   Risk Assessment/Risk Scores:                For questions or updates, please contact [place] Please consult [email or web address] for contact info under    Signed, Sherren Mocha, MD  07/18/2022 6:04 PM

## 2022-07-19 ENCOUNTER — Inpatient Hospital Stay (HOSPITAL_COMMUNITY): Payer: Medicaid Other

## 2022-07-19 DIAGNOSIS — Z992 Dependence on renal dialysis: Secondary | ICD-10-CM | POA: Diagnosis not present

## 2022-07-19 DIAGNOSIS — I351 Nonrheumatic aortic (valve) insufficiency: Secondary | ICD-10-CM | POA: Diagnosis not present

## 2022-07-19 DIAGNOSIS — N186 End stage renal disease: Secondary | ICD-10-CM | POA: Diagnosis not present

## 2022-07-19 DIAGNOSIS — I330 Acute and subacute infective endocarditis: Secondary | ICD-10-CM | POA: Diagnosis not present

## 2022-07-19 DIAGNOSIS — Z01810 Encounter for preprocedural cardiovascular examination: Secondary | ICD-10-CM | POA: Diagnosis not present

## 2022-07-19 DIAGNOSIS — N250 Renal osteodystrophy: Secondary | ICD-10-CM | POA: Diagnosis not present

## 2022-07-19 DIAGNOSIS — R7881 Bacteremia: Secondary | ICD-10-CM | POA: Diagnosis not present

## 2022-07-19 DIAGNOSIS — D631 Anemia in chronic kidney disease: Secondary | ICD-10-CM | POA: Diagnosis not present

## 2022-07-19 DIAGNOSIS — R5081 Fever presenting with conditions classified elsewhere: Secondary | ICD-10-CM | POA: Diagnosis not present

## 2022-07-19 DIAGNOSIS — I120 Hypertensive chronic kidney disease with stage 5 chronic kidney disease or end stage renal disease: Secondary | ICD-10-CM | POA: Diagnosis not present

## 2022-07-19 DIAGNOSIS — I10 Essential (primary) hypertension: Secondary | ICD-10-CM

## 2022-07-19 DIAGNOSIS — B952 Enterococcus as the cause of diseases classified elsewhere: Secondary | ICD-10-CM | POA: Diagnosis not present

## 2022-07-19 DIAGNOSIS — A498 Other bacterial infections of unspecified site: Secondary | ICD-10-CM | POA: Diagnosis not present

## 2022-07-19 LAB — CBC
HCT: 27.6 % — ABNORMAL LOW (ref 39.0–52.0)
Hemoglobin: 8.6 g/dL — ABNORMAL LOW (ref 13.0–17.0)
MCH: 27 pg (ref 26.0–34.0)
MCHC: 31.2 g/dL (ref 30.0–36.0)
MCV: 86.5 fL (ref 80.0–100.0)
Platelets: 314 10*3/uL (ref 150–400)
RBC: 3.19 MIL/uL — ABNORMAL LOW (ref 4.22–5.81)
RDW: 15.9 % — ABNORMAL HIGH (ref 11.5–15.5)
WBC: 12 10*3/uL — ABNORMAL HIGH (ref 4.0–10.5)
nRBC: 0 % (ref 0.0–0.2)

## 2022-07-19 LAB — GLUCOSE, CAPILLARY
Glucose-Capillary: 117 mg/dL — ABNORMAL HIGH (ref 70–99)
Glucose-Capillary: 163 mg/dL — ABNORMAL HIGH (ref 70–99)
Glucose-Capillary: 199 mg/dL — ABNORMAL HIGH (ref 70–99)
Glucose-Capillary: 111 mg/dL — ABNORMAL HIGH (ref 70–99)

## 2022-07-19 LAB — RENAL FUNCTION PANEL
Albumin: 2.3 g/dL — ABNORMAL LOW (ref 3.5–5.0)
Anion gap: 12 (ref 5–15)
BUN: 57 mg/dL — ABNORMAL HIGH (ref 6–20)
CO2: 24 mmol/L (ref 22–32)
Calcium: 8.6 mg/dL — ABNORMAL LOW (ref 8.9–10.3)
Chloride: 104 mmol/L (ref 98–111)
Creatinine, Ser: 11.09 mg/dL — ABNORMAL HIGH (ref 0.61–1.24)
GFR, Estimated: 5 mL/min — ABNORMAL LOW (ref >60)
Glucose, Bld: 95 mg/dL (ref 70–99)
Phosphorus: 5.5 mg/dL — ABNORMAL HIGH (ref 2.5–4.6)
Potassium: 4.2 mmol/L (ref 3.5–5.1)
Sodium: 140 mmol/L (ref 135–145)

## 2022-07-19 LAB — HEPATIC FUNCTION PANEL
ALT: 18 U/L (ref 0–44)
AST: 16 U/L (ref 15–41)
Albumin: 2.3 g/dL — ABNORMAL LOW (ref 3.5–5.0)
Alkaline Phosphatase: 34 U/L — ABNORMAL LOW (ref 38–126)
Bilirubin, Direct: <0.1 mg/dL (ref 0.0–0.2)
Total Bilirubin: 0.6 mg/dL (ref 0.3–1.2)
Total Protein: 6.7 g/dL (ref 6.5–8.1)

## 2022-07-19 LAB — SURGICAL PCR SCREEN
MRSA, PCR: NEGATIVE
Staphylococcus aureus: NEGATIVE

## 2022-07-19 LAB — TSH: TSH: 2.408 u[IU]/mL (ref 0.350–4.500)

## 2022-07-19 MED ORDER — PANTOPRAZOLE SODIUM 20 MG PO TBEC
20.0000 mg | DELAYED_RELEASE_TABLET | Freq: Every day | ORAL | Status: DC
  Administered 2022-07-19 – 2022-07-22 (×4): 20 mg via ORAL
  Filled 2022-07-19 (×4): qty 1

## 2022-07-19 MED ORDER — ALUM & MAG HYDROXIDE-SIMETH 200-200-20 MG/5ML PO SUSP
15.0000 mL | Freq: Four times a day (QID) | ORAL | Status: DC | PRN
  Administered 2022-07-19: 15 mL via ORAL
  Filled 2022-07-19: qty 30

## 2022-07-19 MED ORDER — METHOCARBAMOL 500 MG PO TABS
500.0000 mg | ORAL_TABLET | Freq: Three times a day (TID) | ORAL | Status: DC
  Administered 2022-07-19 – 2022-07-22 (×8): 500 mg via ORAL
  Filled 2022-07-19 (×9): qty 1

## 2022-07-19 NOTE — Progress Notes (Signed)
Nephrology Follow-Up Consult note   Assessment/Recommendations: Jeremy [name] is a/an 41 y.o. male with a past medical history significant for ESRD, admitted for bacteremia.     Subjective: explained to patient that there was change of plans as ID recommended 72 hrs of blood culture neg prior to replacing the [facility]. So we are not planning HD this weekend, unless there is symptomatic issue that requires it. Questions answered and pt was agreeable after a bit of discussion and explaining.   Physical Exam: Constitutional: well-appearing, no acute distress ENMT: ears and nose without scars or lesions, MMM CV: normal rate, no edema Respiratory: no rales or wheezing, normal work of breathing Gastrointestinal: soft, non-tender, no palpable masses or hernias Skin: no visible lesions or rashes Psych: alert, judgement/insight appropriate, appropriate mood and affect  OP HD: [facility]  4h   400/800  124kg  2/2.5 bath  TDC  Hep 5000+ 4000 Mircera 79mcg IV q 4 weeks Venofer 50mg  weekly Hectorol 4mcg IV q HD   Assessment/Plan:  E faecalis bacteremia: Infectious disease on board, getting IV ampicillin. [place] removed on 07/17/2022.  Plan for replacement of catheter Monday 11/13.   ESRD:  TTS schedule. Had HD on 11/8. HD cath now out and plan is for next HD off schedule on Monday.   Hypertension/volume: Some opacities in BL lower lobes on chest CT. Suspect volume related. 1.5kg up today, no O2, no vol excess on exam. UF 2-3 L next HD Monday.   Anemia: Hgb ~8. Last ESA dose given 06/26/22, will start aranesp here  Metabolic bone disease: Phos controlled, calcium at goal. Continue binders and hectorol.   Nutrition:  Albumin low, continue renal diet and protein supplements T2DM: On insulin  Kelly Splinter, MD 07/19/2022, 1:47 PM  Recent Labs  Lab 07/18/22 0335 07/19/22 0247  HGB 8.1* 8.6*  ALBUMIN 2.2* 2.3*  2.3*  CALCIUM 8.1* 8.6*  PHOS 4.6 5.5*  CREATININE 9.76* 11.09*  K 3.9 4.2    Inpatient  medications:  acetaminophen  1,000 mg Oral Q6H   amLODipine  10 mg Oral Daily   carvedilol  37.5 mg Oral BID WC   Chlorhexidine Gluconate Cloth  6 each Topical Q0600   cloNIDine  0.3 mg Oral BID   heparin  5,000 Units Subcutaneous Q8H   hydrALAZINE  100 mg Oral TID   insulin aspart  12 Units Subcutaneous TID WC   insulin glargine-yfgn  25 Units Subcutaneous QHS   lidocaine  1 patch Transdermal Q24H   methocarbamol  500 mg Oral TID   pantoprazole  20 mg Oral Daily   sevelamer carbonate  1,600 mg Oral TID WC   sodium chloride flush  10-40 mL Intracatheter Q12H    ampicillin (OMNIPEN) IV 2 g (07/19/22 0205)   cefTRIAXone (ROCEPHIN)  IV 2 g (07/19/22 1030)   alum & mag hydroxide-simeth, Muscle Rub, ondansetron (ZOFRAN) IV, sodium chloride flush

## 2022-07-19 NOTE — Progress Notes (Signed)
Subjective:   Summary: Jeremy [name] is a 41 y.o. year old male currently admitted on the IMTS HD#4 for bacteremia 2/2 presumed central line infection.  Overnight Events: - NAEON - evaluated by CT surgery after TEE yesterday showed aortic valve endocarditis with severe AI. Needs to have AVR after completing 2 weeks of abx and pre-op tests including CTA coronary on Monday.  Additionally needs to have HD access replaced. -Still endorsing stable back pain.  No saddle anesthesia, no bowel or bladder incontinence, no recent fevers. - Denies chest pain, dyspnea, lower extremity swelling, nausea or vomiting.  Objective:  Vital signs in last 24 hours: Vitals:   07/18/22 1520 07/18/22 2009 07/19/22 0041 07/19/22 0352  BP: (!) 157/82 (!) 166/82 (!) 160/50 (!) 155/67  Pulse: 79 88 90 95  Resp: 18 (!) 24 18 20   Temp: 98 F (36.7 C) 98.2 F (36.8 C) 98.7 F (37.1 C) 98.7 F (37.1 C)  TempSrc: Oral Oral Oral Oral  SpO2: 100% 95% 93% 99%  Weight:      Height:       Supplemental O2: Room Air    Physical Exam:  Constitutional: NAD Cardiovascular: RRR, no murmurs, rubs or gallops Pulmonary/Chest: normal work of breathing on room air, lungs clear to auscultation bilaterally.  Catheter removed, prior site covered in dressings. Abdomen: Soft, nondistended, nontender Skin: warm and dry Extremities: upper/lower extremity pulses 2+, no lower extremity edema present   Filed Weights   07/15/22 1321 07/18/22 1200  Weight: 117.9 kg 120.5 kg     Intake/Output Summary (Last 24 hours) at 07/19/2022 9924 Last data filed at 07/19/2022 0355 Gross per 24 hour  Intake 350 ml  Output 300 ml  Net 50 ml    Net IO Since Admission: -382.09 mL [07/19/22 0633]  Pertinent Labs:    Latest Ref Rng & Units 07/19/2022    2:47 AM 07/18/2022    3:35 AM 07/17/2022    7:12 AM  CBC  WBC 4.0 - 10.5 K/uL 12.0  9.6  10.9   Hemoglobin 13.0 - 17.0 g/dL 8.6  8.1  9.2   Hematocrit 39.0 -  52.0 % 27.6  26.3  29.2   Platelets 150 - 400 K/uL 314  288  312        Latest Ref Rng & Units 07/19/2022    2:47 AM 07/18/2022    3:35 AM 07/17/2022    7:12 AM  CMP  Glucose 70 - 99 mg/dL 95  160  144   BUN 6 - 20 mg/dL 57  52  42   Creatinine 0.61 - 1.24 mg/dL 11.09  9.76  7.41   Sodium 135 - 145 mmol/L 140  134  134   Potassium 3.5 - 5.1 mmol/L 4.2  3.9  3.4   Chloride 98 - 111 mmol/L 104  98  99   CO2 22 - 32 mmol/L 24  22  23    Calcium 8.9 - 10.3 mg/dL 8.6  8.1  8.6   CBGs 163, 117, 91 Urine culture pending Initial blood culture showing Enterococcus faecalis, repeat cultures after line removal showing no growth after 48 hours, fungal culture.  No growth after 24 hours   Imaging: DG Orthopantogram  Result Date: 07/18/2022 CLINICAL DATA:  Severe aortic insufficiency, fever, blood infection EXAM: ORTHOPANTOGRAM/PANORAMIC COMPARISON:  05/10/2012 FINDINGS: Panorex view of the mandible was performed. There are no acute or destructive bony  abnormalities. Bilateral impacted lower wisdom teeth again noted. Otherwise no significant dental abnormalities. IMPRESSION: 1. No acute bony abnormality. Electronically Signed   By: Randa Ngo M.D.   On: 07/18/2022 18:50   ECHO TEE  Result Date: 07/18/2022    TRANSESOPHOGEAL ECHO REPORT   Patient Name:   Jeremy [name] Date of Exam: 07/18/2022 Medical Rec #:  [id number]       Height:       77.0 in Accession #:    [id number]      Weight:       265.7 lb Date of Birth:  [birth year]       BSA:          2.524 m Patient Age:    69 years        BP:           140/79 mmHg Patient Gender: M               HR:           74 bpm. Exam Location:  [facility] Procedure: Transesophageal Echo, Cardiac Doppler, Limited Color Doppler and 3D            Echo Indications:     Bacterimea  History:         Patient has prior history of Echocardiogram examinations, most                  recent 07/16/2022. Cardiomyopathy, Signs/Symptoms:Chest Pain;                  Risk  Factors:Hypertension, Diabetes, Dyslipidemia and                  Non-Smoker.  Sonographer:     Greer Pickerel Sonographer#2:   Darlina Sicilian RDCS Referring Phys:  [id number] [name] Diagnosing Phys: [place] PROCEDURE: After discussion of the risks and benefits of a TEE, an informed consent was obtained from the patient. The transesophogeal probe was passed without difficulty through the esophogus of the patient. Imaged were obtained with the patient in a left lateral decubitus position. Sedation performed by different physician. The patient's vital signs; including heart rate, blood pressure, and oxygen saturation; remained stable throughout the procedure.  IMPRESSIONS  1. Left ventricular ejection fraction, by estimation, is 55 to 60%. The left ventricle has normal function.  2. Right ventricular systolic function is normal. The right ventricular size is normal.  3. No left atrial/left atrial appendage thrombus was detected.  4. 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  5. The mitral valve is normal in structure. No evidence of mitral valve regurgitation.  6. Small defect with non central AI jet concerning for perforation of the Toco approx. 0.2 cm. Small vegetation seen on the Willis (image 59). aortic valve is sclerotic with mild calcification. Central AI jet with VC ~0.67 cm and holodiastolic flow reversal  (image 56) was seen in the descending aorta. PHT 264 ms. Concerning for borderline severe AI.  7. No aortic root abscess. 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 FINDINGS  Left Ventricle: Left ventricular ejection fraction, by estimation, is 55 to 60%. The left ventricle has normal function. The left ventricular internal cavity size was normal in size. Right Ventricle: The right ventricular size is normal. Right ventricular systolic function is normal. Left Atrium: Left atrial size was normal in size. No left atrial/left atrial appendage  thrombus was detected. Right Atrium: Right atrial size was normal in size.  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 Pericardium: There is no evidence of pericardial effusion. Mitral Valve: The mitral valve is normal in structure. No evidence of mitral valve regurgitation. Tricuspid Valve: The tricuspid valve is normal in structure. Tricuspid valve regurgitation is not demonstrated. Aortic Valve: Small defect with non central AI jet concerning for perforation of the NCC approx. 0.2 cm. Small vegetation seen on the [place] (image 59). aortic valve is sclerotic with mild calcification. Central AI jet with VC ~0.67 cm and holodiastolic flow reversal (image 56) was seen in the descending aorta. PHT 264 ms. Concerning for borderline severe AI. Aortic regurgitation PHT measures 264 msec. Pulmonic Valve: The pulmonic valve was normal in structure. Pulmonic valve regurgitation is not visualized. Aorta: No aortic root abscess. IAS/Shunts: No atrial level shunt detected by color flow Doppler. Additional Comments: Spectral Doppler performed. AORTIC VALVE AI PHT:      264 msec Phineas Inches Electronically signed by Phineas Inches Signature Date/Time: 07/18/2022/3:20:15 PM    Final      EKG:   Assessment/Plan:  Patient Summary: Jeremy [name] is a 41 y.o. with a pertinent PMH of HTN, CHF, ESRD on TTS HD, who presented with fevers and chills and admitted for bacteremia and endocarditis.   #E. 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 CT surgery planning for AVR. Pre-op evaluation includes CTA coronary, dental films, course of abx, he also needs replacement of temporary HD access. Appreciate multiple consultant services assisting with this process. Plan: - Appreciate ID assistance with abx. Ampicillin 2g Q12H for 14 days (Day 4) plus ceftriaxone 2 g twice daily - Monitor CBC and fever -Follow-up blood cultures, fungal cultures -Pending CTA coronary study with  cardiology -Appreciate CT surgery recs. -appreciate assistance from IR for line replacement, nephrology with HD.  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  HD access will be replaced with IR.  He will need follow-up with vascular surgery for long-term dialysis access. Plan:  - Continue Revelmar - Zofran as needed after dialysis to manage nausea and vomiting -HD per nephrology, appreciate recs -Replace dialysis catheter after line holiday, appreciate IR assistance. - Reassess long-term access in the outpatient setting with vascular surgery   #Back pain, suspect MSK d/t degenerative changes Back pain slightly better on Tylenol, Robaxin, lidocaine patch.  Appears to be MSK in nature.  He has no red flag symptoms.  Believe PT and mobility will also help his pain.  We will consider reimaging in the outpatient setting before his future AVR date. 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  He is already on significant antihypertensive regimen, with blood pressures better managed after dialysis, will try to modify as appropriate can possibly consider exchanging hydralazine for an ARNI if appropriate. Plan:  - Continue Coreg 37.5 twice daily, clonidine 0.3 mg twice daily, hydralazine 100 mg 3 times daily, amlodipine 10 mg daily   #Elevated troponin, likely demand ischemia Pt had elevated tropnins at 256, then repeat at 212. Low suspicion of ACS.   #Type 2 Diabetes Mellitus  CBGs adequately controlled for now, no hypoglycemic episodes.  Would not be  too aggressive with insulin at this time given necessity of n.p.o. orders for  procedures. Plan:  - Lantus 25U At bedtime  - Novolog 12U with meals  - CBG Checks   #Anemia of Chronic Disease #Hx of Iron Deficiency Anemia Labs c/w inflammation/CKD Plan:  - Monitor CBC  Diet: Renal IVF: None,None VTE: Heparin Code: Full PT/OT recs: Pending,. TOC recs:  Family Update:   Dispo: Anticipated discharge pending treatment of bacteremia, line exchange, infectious workup  Linus Galas, MD PGY-1 Internal Medicine Resident Please contact the on call pager after 5 pm and on weekends at [phone].

## 2022-07-19 NOTE — Progress Notes (Addendum)
Progress Note  Patient Name: Jeremy [name] Date of Encounter: 07/19/2022  Primary Cardiologist: None  Subjective   No acute events overnight. His blood pressure has been poorly controlled despite being on max doses of medications.  Inpatient Medications    Scheduled Meds:  acetaminophen  1,000 mg Oral Q6H   amLODipine  10 mg Oral Daily   carvedilol  37.5 mg Oral BID WC   Chlorhexidine Gluconate Cloth  6 each Topical Q0600   cloNIDine  0.3 mg Oral BID   heparin  5,000 Units Subcutaneous Q8H   hydrALAZINE  100 mg Oral TID   insulin aspart  12 Units Subcutaneous TID WC   insulin glargine-yfgn  25 Units Subcutaneous QHS   lidocaine  1 patch Transdermal Q24H   methocarbamol  500 mg Oral TID   pantoprazole  20 mg Oral Daily   sevelamer carbonate  1,600 mg Oral TID WC   sodium chloride flush  10-40 mL Intracatheter Q12H   Continuous Infusions:  ampicillin (OMNIPEN) IV 2 g (07/19/22 1425)   cefTRIAXone (ROCEPHIN)  IV 2 g (07/19/22 1030)   PRN Meds: alum & mag hydroxide-simeth, Muscle Rub, ondansetron (ZOFRAN) IV, sodium chloride flush   Vital Signs    Vitals:   07/19/22 0500 07/19/22 0845 07/19/22 1221 07/19/22 1630  BP:  (!) 179/91 (!) 183/96 139/67  Pulse:  90 (!) 102 81  Resp:  18 18 20   Temp:  98.5 F (36.9 C) 97.9 F (36.6 C) 98.9 F (37.2 C)  TempSrc:  Oral Oral Oral  SpO2:  96% 99% 97%  Weight: 125.4 kg     Height:        Intake/Output Summary (Last 24 hours) at 07/19/2022 1634 Last data filed at 07/19/2022 1015 Gross per 24 hour  Intake --  Output 800 ml  Net -800 ml   Filed Weights   07/15/22 1321 07/18/22 1200 07/19/22 0500  Weight: 117.9 kg 120.5 kg 125.4 kg    Telemetry     Personally reviewed, normal, HR controlled and normal sinus rhythm  ECG    An ECG dated 07/16/22 was personally reviewed today and demonstrated:  Sinus tachycardia  Physical Exam   GEN: No acute distress.   Neck: No JVD. Cardiac: RRR, no murmur, rub, or  gallop.  Respiratory: Nonlabored. Clear to auscultation bilaterally. GI: Soft, nontender, bowel sounds present. MS: Pain in the back Neuro:  Nonfocal. Psych: Alert and oriented x 3. Normal affect.  Labs    Chemistry Recent Labs  Lab 07/15/22 1315 07/16/22 0139 07/17/22 0712 07/18/22 0335 07/19/22 0247  NA 138   < > 134* 134* 140  K 4.5   < > 3.4* 3.9 4.2  CL 103   < > 99 98 104  CO2 19*   < > 23 22 24   GLUCOSE 240*   < > 144* 160* 95  BUN 60*   < > 42* 52* 57*  CREATININE 9.10*   < > 7.41* 9.76* 11.09*  CALCIUM 9.1   < > 8.6* 8.1* 8.6*  PROT 7.7  --   --   --  6.7  ALBUMIN 2.8*   < > 2.5* 2.2* 2.3*  2.3*  AST 25  --   --   --  16  ALT 26  --   --   --  18  ALKPHOS 55  --   --   --  34*  BILITOT 0.8  --   --   --  0.6  GFRNONAA 7*   < > 9* 6* 5*  ANIONGAP 16*   < > 12 14 12    < > = values in this interval not displayed.     Hematology Recent Labs  Lab 07/17/22 0712 07/18/22 0335 07/19/22 0247  WBC 10.9* 9.6 12.0*  RBC 3.48* 3.03* 3.19*  HGB 9.2* 8.1* 8.6*  HCT 29.2* 26.3* 27.6*  MCV 83.9 86.8 86.5  MCH 26.4 26.7 27.0  MCHC 31.5 30.8 31.2  RDW 15.8* 15.9* 15.9*  PLT 312 288 314    Cardiac Enzymes Recent Labs  Lab 07/15/22 1228 07/15/22 1315  TROPONINIHS 256* 213*    BNPNo results for input(s): "BNP", "PROBNP" in the last 168 hours.   DDimerNo results for input(s): "DDIMER" in the last 168 hours.   Cardiac studies    VAS US DOPPLER PRE CABG  Result Date: 07/19/2022 PREOPERATIVE VASCULAR EVALUATION Patient Name:  Jeremy [name]  Date of Exam:   07/19/2022 Medical Rec #: [id number]        Accession #:    [id number] Date of Birth: 04/26/1981        Patient Gender: M Patient Age:   41 years Exam Location:  [facility] Procedure:      VAS US DOPPLER PRE CABG Referring Phys: Collier Salina VANTRIGT --------------------------------------------------------------------------------  Indications:      Pre-AVR. Risk Factors:     Hypertension, hyperlipidemia,  Diabetes. Other Factors:    Bacteremia with vegetation, ESRD. Comparison Study: No prior studies. Performing Technologist: Darlin Coco RDMS, RVT  Examination Guidelines: A complete evaluation includes B-mode imaging, spectral Doppler, color Doppler, and power Doppler as needed of all accessible portions of each vessel. Bilateral testing is considered an integral part of a complete examination. Limited examinations for reoccurring indications may be performed as noted.  Right Carotid Findings: +----------+--------+--------+--------+--------+--------+           PSV cm/sEDV cm/sStenosisDescribeComments +----------+--------+--------+--------+--------+--------+ CCA Prox  91      12                               +----------+--------+--------+--------+--------+--------+ CCA Distal67      18                               +----------+--------+--------+--------+--------+--------+ ICA Prox  47      18                               +----------+--------+--------+--------+--------+--------+ ICA Mid   85      33                               +----------+--------+--------+--------+--------+--------+ ICA Distal76      24                               +----------+--------+--------+--------+--------+--------+ ECA       149     14                               +----------+--------+--------+--------+--------+--------+ +----------+--------+-------+----------------+------------+           PSV cm/sEDV cmsDescribe        Arm Pressure +----------+--------+-------+----------------+------------+ Subclavian126  Multiphasic, WNL             +----------+--------+-------+----------------+------------+ +---------+--------+--+--------+--+---------+ VertebralPSV cm/s59EDV cm/s17Antegrade +---------+--------+--+--------+--+---------+ Left Carotid Findings: +----------+--------+--------+--------+--------+--------+           PSV cm/sEDV cm/sStenosisDescribeComments  +----------+--------+--------+--------+--------+--------+ CCA Prox  93      14                               +----------+--------+--------+--------+--------+--------+ CCA Distal60      15                               +----------+--------+--------+--------+--------+--------+ ICA Prox  54      20                               +----------+--------+--------+--------+--------+--------+ ICA Mid   72      21                               +----------+--------+--------+--------+--------+--------+ ICA Distal94      27                               +----------+--------+--------+--------+--------+--------+ ECA       115     11                               +----------+--------+--------+--------+--------+--------+  +----------+--------+--------+----------------+------------+ SubclavianPSV cm/sEDV cm/sDescribe        Arm Pressure +----------+--------+--------+----------------+------------+           126             Multiphasic, WNL             +----------+--------+--------+----------------+------------+ +---------+--------+--+--------+--+---------+ VertebralPSV cm/s81EDV cm/s22Antegrade +---------+--------+--+--------+--+---------+  ABI Findings: +--------+------------------+-----+---------+--------+ Right   Rt Pressure (mmHg)IndexWaveform Comment  +--------+------------------+-----+---------+--------+ Brachial                       triphasic         +--------+------------------+-----+---------+--------+ +--------+------------------+-----+---------+-------+ Left    Lt Pressure (mmHg)IndexWaveform Comment +--------+------------------+-----+---------+-------+ Brachial                       triphasic        +--------+------------------+-----+---------+-------+  Right Doppler Findings: +--------+--------+-----+---------+----------------------------------+ Site    PressureIndexDoppler  Comments                            +--------+--------+-----+---------+----------------------------------+ Brachial             triphasic                                   +--------+--------+-----+---------+----------------------------------+ Radial               triphasicDistal stenosis w/ PSV of 503 cm/s +--------+--------+-----+---------+----------------------------------+ Ulnar                triphasic                                   +--------+--------+-----+---------+----------------------------------+  Left Doppler Findings: +--------+--------+-----+---------+--------+  Site    PressureIndexDoppler  Comments +--------+--------+-----+---------+--------+ Brachial             triphasic         +--------+--------+-----+---------+--------+ Radial               triphasic         +--------+--------+-----+---------+--------+ Ulnar                triphasic         +--------+--------+-----+---------+--------+   Summary: Right Carotid: The extracranial vessels were near-normal with only minimal wall                thickening or plaque. Left Carotid: The extracranial vessels were near-normal with only minimal wall               thickening or plaque. Vertebrals:  Bilateral vertebral arteries demonstrate antegrade flow. Subclavians: Normal flow hemodynamics were seen in bilateral subclavian              arteries. Right Upper Extremity: Doppler waveforms remain within normal limits with right radial compression. Doppler waveform obliterate with right ulnar compression. 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 Left Upper Extremity: Doppler waveforms remain within normal limits with left radial compression. Doppler waveforms remain within normal limits with left ulnar compression.  Electronically signed by Monica Martinez MD on 07/19/2022 at 1:13:36 PM.    Final    DG Orthopantogram  Result Date: 07/18/2022 CLINICAL DATA:  Severe aortic insufficiency,  fever, blood infection EXAM: ORTHOPANTOGRAM/PANORAMIC COMPARISON:  05/10/2012 FINDINGS: Panorex view of the mandible was performed. There are no acute or destructive bony abnormalities. Bilateral impacted lower wisdom teeth again noted. Otherwise no significant dental abnormalities. IMPRESSION: 1. No acute bony abnormality. Electronically Signed   By: Randa Ngo M.D.   On: 07/18/2022 18:50   ECHO TEE  Result Date: 07/18/2022    TRANSESOPHOGEAL ECHO REPORT   Patient Name:   KAILER HEINDEL Date of Exam: 07/18/2022 Medical Rec #:  [id number]       Height:       77.0 in Accession #:    [id number]      Weight:       265.7 lb Date of Birth:  November 18, 1980       BSA:          2.524 m Patient Age:    51 years        BP:           140/79 mmHg Patient Gender: M               HR:           74 bpm. Exam Location:  [place] Procedure: Transesophageal Echo, Cardiac Doppler, Limited Color Doppler and 3D            Echo Indications:     Bacterimea  History:         Patient has prior history of Echocardiogram examinations, most                  recent 07/16/2022. Cardiomyopathy, Signs/Symptoms:Chest Pain;                  Risk Factors:Hypertension, Diabetes, Dyslipidemia and                  Non-Smoker.  Sonographer:     Greer Pickerel Sonographer#2:   Darlina Sicilian RDCS Referring Phys:  [id number] [name] Diagnosing Phys:  [place] PROCEDURE: After discussion of the risks and benefits of a TEE, an informed consent was obtained from the patient. The transesophogeal probe was passed without difficulty through the esophogus of the patient. Imaged were obtained with the patient in a left lateral decubitus position. Sedation performed by different physician. The patient's vital signs; including heart rate, blood pressure, and oxygen saturation; remained stable throughout the procedure.  IMPRESSIONS  1. Left ventricular ejection fraction, by estimation, is 55 to 60%. The left ventricle has normal function.  2. Right  ventricular systolic function is normal. The right ventricular size is normal.  3. No left atrial/left atrial appendage thrombus was detected.  4. 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  5. The mitral valve is normal in structure. No evidence of mitral valve regurgitation.  6. Small defect with non central AI jet concerning for perforation of the [employer] approx. 0.2 cm. Small vegetation seen on the [place] (image 59). aortic valve is sclerotic with mild calcification. Central AI jet with VC ~0.67 cm and holodiastolic flow reversal  (image 56) was seen in the descending aorta. PHT 264 ms. Concerning for borderline severe AI.  7. No aortic root abscess. 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 FINDINGS  Left Ventricle: Left ventricular ejection fraction, by estimation, is 55 to 60%. The left ventricle has normal function. The left ventricular internal cavity size was normal in size. Right Ventricle: The right ventricular size is normal. Right ventricular systolic function is normal. Left Atrium: Left atrial size was normal in size. No left atrial/left atrial appendage thrombus was detected. Right Atrium: Right atrial size was normal in size. 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 Pericardium: There is no evidence of pericardial effusion. Mitral Valve: The mitral valve is normal in structure. No evidence of mitral valve regurgitation. Tricuspid Valve: The tricuspid valve is normal in structure. Tricuspid valve regurgitation is not demonstrated. Aortic Valve: Small defect with non central AI jet concerning for perforation of the NCC approx. 0.2 cm. Small vegetation seen on the [place] (image 59). aortic valve is sclerotic with mild calcification. Central AI jet with VC ~0.67 cm and holodiastolic flow reversal (image 56) was seen in the descending aorta. PHT 264 ms. Concerning for borderline severe AI. Aortic regurgitation PHT measures 264 msec. Pulmonic Valve: The pulmonic valve  was normal in structure. Pulmonic valve regurgitation is not visualized. Aorta: No aortic root abscess. IAS/Shunts: No atrial level shunt detected by color flow Doppler. Additional Comments: Spectral Doppler performed. 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 Patient will need to be on 2-week course of antibiotics before they plan replacing aortic valve. We will schedule him for CTA cardiac on 07/21/2022 for coronary artery evaluation.   #HTN, poorly controlled Plan -Patient stated his blood pressures have always been poorly controlled.  Continue current medications, amlodipine 10 mg once daily, clonidine 0.3 mg twice daily, hydralazine 100 mg 3 times a day. Patient refused to be on nitrates because it gives him terrible headaches. Would  wean him off carvedilol due to severe aortic insufficiency.  In addition, recommend treating his back pain which might help his HTN.  I have spent a total of 35 minutes with patient reviewing chart , telemetry, EKGs, labs and examining patient as well as establishing an assessment and plan that was discussed with the patient.  > 50% of time was spent in direct patient care.       Signed, Chalmers Guest, MD  07/19/2022, 4:34 PM

## 2022-07-19 NOTE — Progress Notes (Signed)
Pre-AVR ultrasound study completed.   Please see CV Proc for preliminary results.   Darlin Coco, RDMS, RVT

## 2022-07-20 ENCOUNTER — Encounter (HOSPITAL_COMMUNITY): Payer: Self-pay | Admitting: Internal Medicine

## 2022-07-20 DIAGNOSIS — I358 Other nonrheumatic aortic valve disorders: Secondary | ICD-10-CM

## 2022-07-20 DIAGNOSIS — Z992 Dependence on renal dialysis: Secondary | ICD-10-CM | POA: Diagnosis not present

## 2022-07-20 DIAGNOSIS — M5441 Lumbago with sciatica, right side: Secondary | ICD-10-CM

## 2022-07-20 DIAGNOSIS — I38 Endocarditis, valve unspecified: Secondary | ICD-10-CM | POA: Diagnosis not present

## 2022-07-20 DIAGNOSIS — D631 Anemia in chronic kidney disease: Secondary | ICD-10-CM | POA: Diagnosis not present

## 2022-07-20 DIAGNOSIS — I120 Hypertensive chronic kidney disease with stage 5 chronic kidney disease or end stage renal disease: Secondary | ICD-10-CM | POA: Diagnosis not present

## 2022-07-20 DIAGNOSIS — N186 End stage renal disease: Secondary | ICD-10-CM

## 2022-07-20 DIAGNOSIS — N250 Renal osteodystrophy: Secondary | ICD-10-CM | POA: Diagnosis not present

## 2022-07-20 DIAGNOSIS — R7881 Bacteremia: Secondary | ICD-10-CM | POA: Diagnosis not present

## 2022-07-20 DIAGNOSIS — B952 Enterococcus as the cause of diseases classified elsewhere: Secondary | ICD-10-CM | POA: Diagnosis not present

## 2022-07-20 LAB — GLUCOSE, CAPILLARY
Glucose-Capillary: 157 mg/dL — ABNORMAL HIGH (ref 70–99)
Glucose-Capillary: 176 mg/dL — ABNORMAL HIGH (ref 70–99)
Glucose-Capillary: 208 mg/dL — ABNORMAL HIGH (ref 70–99)
Glucose-Capillary: 120 mg/dL — ABNORMAL HIGH (ref 70–99)

## 2022-07-20 LAB — BASIC METABOLIC PANEL
Anion gap: 14 (ref 5–15)
BUN: 63 mg/dL — ABNORMAL HIGH (ref 6–20)
CO2: 20 mmol/L — ABNORMAL LOW (ref 22–32)
Calcium: 8.6 mg/dL — ABNORMAL LOW (ref 8.9–10.3)
Chloride: 104 mmol/L (ref 98–111)
Creatinine, Ser: 11.77 mg/dL — ABNORMAL HIGH (ref 0.61–1.24)
GFR, Estimated: 5 mL/min — ABNORMAL LOW (ref >60)
Glucose, Bld: 145 mg/dL — ABNORMAL HIGH (ref 70–99)
Potassium: 4.6 mmol/L (ref 3.5–5.1)
Sodium: 138 mmol/L (ref 135–145)

## 2022-07-20 LAB — CBC
HCT: 28.3 % — ABNORMAL LOW (ref 39.0–52.0)
Hemoglobin: 9 g/dL — ABNORMAL LOW (ref 13.0–17.0)
MCH: 27.4 pg (ref 26.0–34.0)
MCHC: 31.8 g/dL (ref 30.0–36.0)
MCV: 86 fL (ref 80.0–100.0)
Platelets: 345 10*3/uL (ref 150–400)
RBC: 3.29 MIL/uL — ABNORMAL LOW (ref 4.22–5.81)
RDW: 15.9 % — ABNORMAL HIGH (ref 11.5–15.5)
WBC: 11.5 10*3/uL — ABNORMAL HIGH (ref 4.0–10.5)
nRBC: 0 % (ref 0.0–0.2)

## 2022-07-20 MED ORDER — SENNOSIDES-DOCUSATE SODIUM 8.6-50 MG PO TABS
1.0000 | ORAL_TABLET | Freq: Two times a day (BID) | ORAL | Status: DC
  Administered 2022-07-20 (×2): 1 via ORAL
  Filled 2022-07-20 (×4): qty 1

## 2022-07-20 MED ORDER — CHLORHEXIDINE GLUCONATE CLOTH 2 % EX PADS
6.0000 | MEDICATED_PAD | Freq: Every day | CUTANEOUS | Status: DC
  Administered 2022-07-21: 6 via TOPICAL

## 2022-07-20 MED ORDER — POLYETHYLENE GLYCOL 3350 17 G PO PACK
17.0000 g | PACK | Freq: Every day | ORAL | Status: DC
  Administered 2022-07-20: 17 g via ORAL
  Filled 2022-07-20 (×3): qty 1

## 2022-07-20 NOTE — Progress Notes (Addendum)
Jeremy [name] for Infectious Disease    Date of Admission:  07/15/2022   Total days of antibiotics 6/day 4 since clearance of bacteremia         ID: Jeremy [name] is a 41 y.o. male with e.faecalis AV endocarditis with severe AI .thought to be related to HD catheter Principal Problem:   Fever Active Problems:   Primary hypertension   Bacteremia   Catheter-related bloodstream infection   Enterococcus faecalis infection   Acute bacterial endocarditis   Nonrheumatic aortic valve insufficiency    Subjective: Afebrile but having significant pain radiating from back to right leg, with associated numbness;spouse is worried that it is not being addressed  Medications:   acetaminophen  1,000 mg Oral Q6H   amLODipine  10 mg Oral Daily   carvedilol  37.5 mg Oral BID WC   Chlorhexidine Gluconate Cloth  6 each Topical Q0600   cloNIDine  0.3 mg Oral BID   heparin  5,000 Units Subcutaneous Q8H   hydrALAZINE  100 mg Oral TID   insulin aspart  12 Units Subcutaneous TID WC   insulin glargine-yfgn  25 Units Subcutaneous QHS   lidocaine  1 patch Transdermal Q24H   methocarbamol  500 mg Oral TID   pantoprazole  20 mg Oral Daily   polyethylene glycol  17 g Oral Daily   senna-docusate  1 tablet Oral BID   sevelamer carbonate  1,600 mg Oral TID WC   sodium chloride flush  10-40 mL Intracatheter Q12H    Objective: Vital signs in last 24 hours: Temp:  [97.6 F (36.4 C)-98.9 F (37.2 C)] 98.2 F (36.8 C) (11/12 0809) Pulse Rate:  [70-102] 82 (11/12 0809) Resp:  [18-20] 18 (11/12 0809) BP: (124-183)/(57-96) 162/75 (11/12 0809) SpO2:  [97 %-100 %] 98 % (11/12 0809) Weight:  [125.9 kg] 125.9 kg (11/12 0440) Physical Exam  Constitutional: He is oriented to person, place, and time. He appears well-developed and well-nourished. No distress.  HENT:  Mouth/Throat: Oropharynx is clear and moist. No oropharyngeal exudate.  Cardiovascular: Normal rate, regular rhythm and normal heart sounds.  Exam reveals no gallop and no friction rub.  No murmur heard.  Pulmonary/Chest: Effort normal and breath sounds normal. No respiratory distress. He has no wheezes.  Abdominal: Soft. Bowel sounds are normal. He exhibits no distension. There is no tenderness.  Lymphadenopathy:  He has no cervical adenopathy.  Neurological: He is alert and oriented to person, place, and time. Decreased sensation to right thigh vs. Left thigh, motor intact. Skin: Skin is warm and dry. No rash noted. No erythema.  Psychiatric: He has a normal mood and affect. His behavior is normal.    Lab Results Recent Labs    07/18/22 0335 07/19/22 0247 07/20/22 0652  WBC 9.6 12.0*  --   HGB 8.1* 8.6*  --   HCT 26.3* 27.6*  --   NA 134* 140 138  K 3.9 4.2 4.6  CL 98 104 104  CO2 22 24 20*  BUN 52* 57* 63*  CREATININE 9.76* 11.09* 11.77*   Liver Panel Recent Labs    07/18/22 0335 07/19/22 0247  PROT  --  6.7  ALBUMIN 2.2* 2.3*  2.3*  AST  --  16  ALT  --  18  ALKPHOS  --  34*  BILITOT  --  0.6  BILIDIR  --  <0.1  IBILI  --  NOT CALCULATED   Sedimentation Rate No results for input(s): "ESRSEDRATE" in the last 72 hours.  C-Reactive Protein No results for input(s): "CRP" in the last 72 hours.  Microbiology: 11/07 blood cx- amp S -e.faecalis 11/09 blood cx NGTD Studies/Results: VAS US DOPPLER PRE CABG  Result Date: 07/19/2022 PREOPERATIVE VASCULAR EVALUATION Patient Name:  Jeremy [name]  Date of Exam:   07/19/2022 Medical Rec #: [id number]        Accession #:    [id number] Date of Birth: 03-17-1981        Patient Gender: M Patient Age:   35 years Exam Location:  [facility] Procedure:      VAS US DOPPLER PRE CABG Referring Phys: Collier Salina VANTRIGT --------------------------------------------------------------------------------  Indications:      Pre-AVR. Risk Factors:     Hypertension, hyperlipidemia, Diabetes. Other Factors:    Bacteremia with vegetation, ESRD. Comparison Study: No prior studies.  Performing Technologist: Darlin Coco RDMS, RVT  Examination Guidelines: A complete evaluation includes B-mode imaging, spectral Doppler, color Doppler, and power Doppler as needed of all accessible portions of each vessel. Bilateral testing is considered an integral part of a complete examination. Limited examinations for reoccurring indications may be performed as noted.  Right Carotid Findings: +----------+--------+--------+--------+--------+--------+           PSV cm/sEDV cm/sStenosisDescribeComments +----------+--------+--------+--------+--------+--------+ CCA Prox  91      12                               +----------+--------+--------+--------+--------+--------+ CCA Distal67      18                               +----------+--------+--------+--------+--------+--------+ ICA Prox  47      18                               +----------+--------+--------+--------+--------+--------+ ICA Mid   85      33                               +----------+--------+--------+--------+--------+--------+ ICA Distal76      24                               +----------+--------+--------+--------+--------+--------+ ECA       149     14                               +----------+--------+--------+--------+--------+--------+ +----------+--------+-------+----------------+------------+           PSV cm/sEDV cmsDescribe        Arm Pressure +----------+--------+-------+----------------+------------+ Subclavian126            Multiphasic, WNL             +----------+--------+-------+----------------+------------+ +---------+--------+--+--------+--+---------+ VertebralPSV cm/s59EDV cm/s17Antegrade +---------+--------+--+--------+--+---------+ Left Carotid Findings: +----------+--------+--------+--------+--------+--------+           PSV cm/sEDV cm/sStenosisDescribeComments +----------+--------+--------+--------+--------+--------+ CCA Prox  93      14                                +----------+--------+--------+--------+--------+--------+ CCA Distal60      15                               +----------+--------+--------+--------+--------+--------+  ICA Prox  54      20                               +----------+--------+--------+--------+--------+--------+ ICA Mid   72      21                               +----------+--------+--------+--------+--------+--------+ ICA Distal94      27                               +----------+--------+--------+--------+--------+--------+ ECA       115     11                               +----------+--------+--------+--------+--------+--------+  +----------+--------+--------+----------------+------------+ SubclavianPSV cm/sEDV cm/sDescribe        Arm Pressure +----------+--------+--------+----------------+------------+           126             Multiphasic, WNL             +----------+--------+--------+----------------+------------+ +---------+--------+--+--------+--+---------+ VertebralPSV cm/s81EDV cm/s22Antegrade +---------+--------+--+--------+--+---------+  ABI Findings: +--------+------------------+-----+---------+--------+ Right   Rt Pressure (mmHg)IndexWaveform Comment  +--------+------------------+-----+---------+--------+ Brachial                       triphasic         +--------+------------------+-----+---------+--------+ +--------+------------------+-----+---------+-------+ Left    Lt Pressure (mmHg)IndexWaveform Comment +--------+------------------+-----+---------+-------+ Brachial                       triphasic        +--------+------------------+-----+---------+-------+  Right Doppler Findings: +--------+--------+-----+---------+----------------------------------+ Site    PressureIndexDoppler  Comments                           +--------+--------+-----+---------+----------------------------------+ Brachial             triphasic                                    +--------+--------+-----+---------+----------------------------------+ Radial               triphasicDistal stenosis w/ PSV of 503 cm/s +--------+--------+-----+---------+----------------------------------+ Ulnar                triphasic                                   +--------+--------+-----+---------+----------------------------------+  Left Doppler Findings: +--------+--------+-----+---------+--------+ Site    PressureIndexDoppler  Comments +--------+--------+-----+---------+--------+ Brachial             triphasic         +--------+--------+-----+---------+--------+ Radial               triphasic         +--------+--------+-----+---------+--------+ Ulnar                triphasic         +--------+--------+-----+---------+--------+   Summary: Right Carotid: The extracranial vessels were near-normal with only minimal wall                thickening or plaque. Left Carotid: The extracranial vessels  were near-normal with only minimal wall               thickening or plaque. Vertebrals:  Bilateral vertebral arteries demonstrate antegrade flow. Subclavians: Normal flow hemodynamics were seen in bilateral subclavian              arteries. Right Upper Extremity: Doppler waveforms remain within normal limits with right radial compression. Doppler waveform obliterate with right ulnar compression. Incidental finding: Distal right radial artery stenosis with peak systolic velocities of 469 cm/s. Left Upper Extremity: Doppler waveforms remain within normal limits with left radial compression. Doppler waveforms remain within normal limits with left ulnar compression.  Electronically signed by Monica Martinez MD on 07/19/2022 at 1:13:36 PM.    Final    DG Orthopantogram  Result Date: 07/18/2022 CLINICAL DATA:  Severe aortic insufficiency, fever, blood infection EXAM: ORTHOPANTOGRAM/PANORAMIC COMPARISON:  05/10/2012 FINDINGS: Panorex view of the mandible was performed.  There are no acute or destructive bony abnormalities. Bilateral impacted lower wisdom teeth again noted. Otherwise no significant dental abnormalities. IMPRESSION: 1. No acute bony abnormality. Electronically Signed   By: Randa Ngo M.D.   On: 07/18/2022 18:50   ECHO TEE  Result Date: 07/18/2022    TRANSESOPHOGEAL ECHO REPORT   Patient Name:   Jeremy [name] Date of Exam: 07/18/2022 Medical Rec #:  [id number]       Height:       77.0 in Accession #:    [id number]      Weight:       265.7 lb Date of Birth:  1980/11/22       BSA:          2.524 m Patient Age:    10 years        BP:           140/79 mmHg Patient Gender: M               HR:           74 bpm. Exam Location:  [place] Procedure: Transesophageal Echo, Cardiac Doppler, Limited Color Doppler and 3D            Echo Indications:     Bacterimea  History:         Patient has prior history of Echocardiogram examinations, most                  recent 07/16/2022. Cardiomyopathy, Signs/Symptoms:Chest Pain;                  Risk Factors:Hypertension, Diabetes, Dyslipidemia and                  Non-Smoker.  Sonographer:     Greer Pickerel Sonographer#2:   Darlina Sicilian RDCS Referring Phys:  [id number] [name] Diagnosing Phys: [place] PROCEDURE: After discussion of the risks and benefits of a TEE, an informed consent was obtained from the patient. The transesophogeal probe was passed without difficulty through the esophogus of the patient. Imaged were obtained with the patient in a left lateral decubitus position. Sedation performed by different physician. The patient's vital signs; including heart rate, blood pressure, and oxygen saturation; remained stable throughout the procedure.  IMPRESSIONS  1. Left ventricular ejection fraction, by estimation, is 55 to 60%. The left ventricle has normal function.  2. Right ventricular systolic function is normal. The right ventricular size is normal.  3. No left atrial/left atrial appendage thrombus was  detected.  4. SVC thrombus seen (image  57).  5. The mitral valve is normal in structure. No evidence of mitral valve regurgitation.  6. Small defect with non central AI jet concerning for perforation of the [place] approx. 0.2 cm. Small vegetation seen on the [place] (image 59). aortic valve is sclerotic with mild calcification. Central AI jet with VC ~0.67 cm and holodiastolic flow reversal  (image 56) was seen in the descending aorta. PHT 264 ms. Concerning for borderline severe AI.  7. No aortic root abscess. 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 FINDINGS  Left Ventricle: Left ventricular ejection fraction, by estimation, is 55 to 60%. The left ventricle has normal function. The left ventricular internal cavity size was normal in size. Right Ventricle: The right ventricular size is normal. Right ventricular systolic function is normal. Left Atrium: Left atrial size was normal in size. No left atrial/left atrial appendage thrombus was detected. Right Atrium: Right atrial size was normal in size. 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 Pericardium: There is no evidence of pericardial effusion. Mitral Valve: The mitral valve is normal in structure. No evidence of mitral valve regurgitation. Tricuspid Valve: The tricuspid valve is normal in structure. Tricuspid valve regurgitation is not demonstrated. Aortic Valve: Small defect with non central AI jet concerning for perforation of the NCC approx. 0.2 cm. Small vegetation seen on the [place] (image 59). aortic valve is sclerotic with mild calcification. Central AI jet with VC ~0.67 cm and holodiastolic flow reversal (image 56) was seen in the descending aorta. PHT 264 ms. Concerning for borderline severe AI. Aortic regurgitation PHT measures 264 msec. Pulmonic Valve: The pulmonic valve was normal in structure. Pulmonic valve regurgitation is not visualized. Aorta: No aortic root abscess. IAS/Shunts: No atrial level  shunt detected by color flow Doppler. Additional Comments: Spectral Doppler performed. 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 The plan is to have minimum of 2 wk of IV abtx prior to getting AVR replacement.  New HD line and tunneled line for abtx to be placed by IR tomorrow  Cardiac surgery work up -- he is scheduled for gated coronary CTA on Monday  Dental health = please have dentistry formally evaluate patient to see if any teeth extractions need to be done prior to surgery  Right leg numbness/sciatica like back pain = has mri of lumbar spine which shows mild narrowing of l4-l5 recess. May need to consider  further evaluation vs different therapies to treat pain. Imaging shows possible early osteo?  [facility] for Infectious Diseases Pager: [phone]  07/20/2022, 10:38 AM

## 2022-07-20 NOTE — Progress Notes (Signed)
HD#5 SUBJECTIVE:  Patient Summary: Jeremy [name] is a 41 y.o. with a pertinent PMH of HTN, CHF, ESRD on TTS HD, who presented with fevers and chills and admitted for bacteremia and endocarditis.   Overnight Events: None  Interim History: [name] remains eager to leave as soon as he is able but understands there are several remaining tests/procedures that must happen before he is able to go home.   OBJECTIVE:  Vital Signs: Vitals:   07/19/22 2344 07/20/22 0438 07/20/22 0440 07/20/22 0809  BP: (!) 124/57 128/64  (!) 162/75  Pulse: 70 84  82  Resp: 18 19  18   Temp: 97.8 F (36.6 C) 97.6 F (36.4 C)  98.2 F (36.8 C)  TempSrc:  Oral  Oral  SpO2: 98% 98%  98%  Weight:   125.9 kg   Height:       Supplemental O2: Room Air SpO2: 98 % O2 Flow Rate (L/min): 3 L/min  Filed Weights   07/18/22 1200 07/19/22 0500 07/20/22 0440  Weight: 120.5 kg 125.4 kg 125.9 kg     Intake/Output Summary (Last 24 hours) at 07/20/2022 1155 Last data filed at 07/20/2022 0749 Gross per 24 hour  Intake --  Output 1025 ml  Net -1025 ml   Net IO Since Admission: -1,907.09 mL [07/20/22 1155]  Physical Exam: Constitutional:Resting comfortably, streaming church service on his phone. In no acute distress. Cardio:Regular rate and rhythm.  Pulm: Normal work of breathing on room air. ZOX:WRUEAVWU for extremity edema. Skin:Warm and dry. Neuro:Alert and oriented x3. No focal deficit noted. Psych:Pleasant mood and affect.  Patient Lines/Drains/Airways Status     Active Line/Drains/Airways     Name Placement date Placement time Site Days   Peripheral IV 07/02/21 20 G Left;Posterior Hand 07/02/21  1415  Hand  383   Peripheral IV 07/02/21 20 G Left;Upper Forearm 07/02/21  2002  Forearm  383   Peripheral IV 07/16/22 22 G 1.75" Left;Lateral Forearm 07/16/22  2016  Forearm  4   Fistula / Graft Right Forearm Arteriovenous fistula 12/18/20  0849  Forearm  579   Incision (Closed) 11/08/13 Chest Left  11/08/13  1236  -- 3176   Incision (Closed) 11/08/13 Neck Left 11/08/13  1236  -- 3176   Incision (Closed) 06/02/16 Flank Right;Lower 06/02/16  1400  -- 2239   Incision (Closed) 12/18/20 Arm Right 12/18/20  0847  -- 579   Wound / Incision (Open or Dehisced) 10/14/19 Other (Comment) Throat Right top layer of skin is gone 10/14/19  1805  Throat  1010             ASSESSMENT/PLAN:  Assessment: Principal Problem:   Fever Active Problems:   Primary hypertension   Bacteremia   Catheter-related bloodstream infection   Enterococcus faecalis infection   Acute bacterial endocarditis   Nonrheumatic aortic valve insufficiency   Plan: #E. Faecalis Bacteremia #Endocarditis with severe AI Ongoing pre-op evaluation, remaining tasks include CTA coronary, continued course of antibiotics, and replacement of HD catheter. He is set to have HD catheter replaced by IR 11/13. Repeat blood cultures show no growth at 3 days, fungal cultures show no growth at 2 days. 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 Renal function stable. -HD once he has access -Continue renvela -Zofran PRN post-HD n/v -Will need outpatient f/u with [facility] for AV fistula   #Back pain, suspect MSK d/t degenerative changes Currently managing with tylenol, robaxin, muscle rub, and lidocaine patch; no complaint of pain this morning  though he has been declining out of bed mobility due to pain. No red flag symptoms.  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  BP have been overall stable and WNL with adherence to multi-therapy regimen.  -Continue Coreg 37.5 mg BID, clonidine 0.3 mg BID, hydralazine 100 mg TID, amlodipine 10 mg daily -Consider changing hydralazine to an ARNI   #Type 2 Diabetes Mellitus  CBGs consistently <180 with no hypoglycemia. -Continue semglee 25 units daily at bedtime, novolog 12 units TID with meals -Follow CBG   #Anemia of Chronic Disease #Hx of Iron Deficiency Anemia Stable. -Trend CBC  Best Practice: Diet: Renal diet IVF: None VTE: heparin injection 5,000 Units Start: 07/16/22 0600 Code: Full AB: Ceftriaxone, ampicillin Therapy Recs: Outpatient PT DISPO: Anticipated discharge to Home pending  completion of pre-op work-up, HD access, outpatient IV antibiotic access .  Signature: Farrel Gordon, D.O.  Internal Medicine Resident, PGY-2 [facility]  Pager: [phone]   Please contact the on call pager after 5 pm and on weekends at [phone].

## 2022-07-20 NOTE — Progress Notes (Signed)
Placed patient on CPAP for the night via auto-mode.  

## 2022-07-20 NOTE — Progress Notes (Signed)
Mobility Specialist Progress Note:   07/20/22 0900  Mobility  Activity  (bed level ex)  Level of Assistance Modified independent, requires aide device or extra time  Assistive Device None  Range of Motion/Exercises Active  Activity Response Tolerated fair  Mobility Referral Yes  $Mobility charge 1 Mobility   Pt agreeable to mobility session, however still politely declining OOB mobility d/t back pain. Pt able to demonstrate bed a few bed level strengthening exercises with min back pain. States he will get OOB later today. Will f/u if time allows.   Nelta Numbers Mobility Specialist Please contact via SecureChat or  Rehab office at [phone]

## 2022-07-20 NOTE — Consult Note (Signed)
Chief Complaint: Patient was seen in consultation today for tunneled dialysis catheter and possible tunneled central catheter placement Chief Complaint  Patient presents with   Blood Infection   at the request of Dr Jonnie Finner; Dr Danny Lawless   Supervising Physician: Aletta Edouard  Patient Status: [facility] - In-pt  History of Present Illness: Pharell Rolfson is a 41 y.o. male   ESRD; admitted for bacteremia Vasc team HD catheter removed in IR 11/9--- Holiday since then Request replacement per Nephrology Scheduled now for same 11/13   TEE showing Ao Valve endocarditis Will need AVR after antibiotics completed Requesting tunneled central catheter placement per Int Med MD  Consented for both lines IR Rad will determine placement timing HD will be planned for 11/13 definitely-- timing to be determined for Tunn central line    Past Medical History:  Diagnosis Date   Abscess of left groin    Acute blood loss anemia 11/11/2013   Anemia in chronic kidney disease (CKD)    Asthma    Boil of scrotum 11/21/2015   Chest pain    a. 01/2015 Lexiscan MV: EF 28%, inferior, inferolateral, apical ischemia;  b. 01/2015 Cath: nl cors, PCWP 18 mmHg, CO 9.38 L/min, CI 3.53 L/min/m^2.   CHF (congestive heart failure) (HCC)    Depression    Situational   ESRD (end stage renal disease) ([place])    TTHS- [place]   Essential hypertension    Family history of adverse reaction to anesthesia    sister- "it was too much for her heart" died   GERD (gastroesophageal reflux disease)    Hyperlipidemia    Membranous glomerulonephritis    Morbid obesity ([place])    Nonischemic cardiomyopathy ([place])    a. 01/2015 Echo: EF 20-25%, diff HK, Gr 2 DD, Triv AI, mildly dil LA and Ao root.   PONV (postoperative nausea and vomiting)    Seizures ([place]) 02/29/2020   "electralytes were out of wack."   Type II diabetes mellitus ([place])    a. 01/2015 HbA1c = 8.9.    Past Surgical History:  Procedure Laterality  Date   A/V FISTULAGRAM N/A 08/12/2021   Procedure: A/V FISTULAGRAM;  Surgeon: Waynetta Sandy, MD;  Location: [place] [facility];  Service: Cardiovascular;  Laterality: N/A;   AV FISTULA PLACEMENT Right 12/18/2020   Procedure: RIGHT ARM RADIOCEPHALIC  ARTERIOVENOUS (AV) FISTULA CREATION;  Surgeon: Waynetta Sandy, MD;  Location: [place];  Service: Vascular;  Laterality: Right;   CARDIAC CATHETERIZATION N/A 02/01/2015   Procedure: Right/Left Heart Cath and Coronary Angiography;  Surgeon: Josue Hector, MD;  Location: [place] [facility];  Service: Cardiovascular;  Laterality: N/A;   IR FLUORO GUIDE CV LINE RIGHT  10/11/2019   IR REMOVAL TUN CV CATH W/O FL  07/17/2022   IR US GUIDE VASC ACCESS RIGHT  10/11/2019   THORACOTOMY Left 11/08/2013   Procedure: LEFT THORACOTOMY;  Surgeon: Ivin Poot, MD;  Location: [place];  Service: Thoracic;  Laterality: Left;    Allergies: Acyclovir and related  Medications: Prior to Admission medications   Medication Sig Start Date End Date Taking? Authorizing Provider  acetaminophen (TYLENOL) 500 MG tablet Take 1,000 mg by mouth every 6 (six) hours as needed for moderate pain.   Yes [provider]  amLODipine (NORVASC) 10 MG tablet Take 1 tablet (10 mg total) by mouth daily. 01/10/22  Yes Axel Filler, MD  atorvastatin (LIPITOR) 80 MG tablet Take 1 tablet (80 mg total) by mouth  daily. NEEDS FOLLOW UP APPOINTMENT FOR ANYMORE REFILLS Patient taking differently: Take 80 mg by mouth daily. 05/08/22  Yes Bensimhon, Shaune Pascal, MD  AURYXIA 1 GM 210 MG(Fe) tablet Take 420 mg by mouth 3 (three) times daily. 08/05/21  Yes [provider]  carvedilol (COREG) 25 MG tablet Take 1.5 tablets (37.5 mg total) by mouth 2 (two) times daily with a meal. Need to schedule an appointment for further refills 05/05/22  Yes [name], MD  cloNIDine (CATAPRES) 0.3 MG tablet Take 1 tablet (0.3 mg total) by mouth 2 (two) times daily. 04/10/21  Yes  [name], MD  Doxercalciferol (HECTOROL IV) Inject 1 Dose into the vein Every Tuesday,Thursday,and Saturday with dialysis. 06/05/22 06/04/23 Yes [provider]  glipiZIDE (GLUCOTROL) 5 MG tablet Take 1 tablet (5 mg total) by mouth daily before breakfast. 03/07/22  Yes Amponsah, Charisse March, MD  hydrALAZINE (APRESOLINE) 100 MG tablet Take 1 tablet (100 mg total) by mouth 3 (three) times daily. 01/31/22  Yes Bensimhon, Shaune Pascal, MD  ibuprofen (ADVIL) 200 MG tablet Take 400 mg by mouth every 6 (six) hours as needed for moderate pain.   Yes [provider]  insulin aspart (NOVOLOG FLEXPEN) 100 UNIT/ML FlexPen Inject 15 Units into the skin 3 (three) times daily with meals. Patient taking differently: Inject 17 Units into the skin 3 (three) times daily with meals. 03/25/22 07/15/22 Yes Masters, Katie, DO  insulin glargine (LANTUS) 100 unit/mL SOPN Inject 37 Units into the skin at bedtime. 10/07/21 07/15/22 Yes Gaylan Gerold, DO  lidocaine (LIDODERM) 5 % Place 1 patch onto the skin daily. Remove and Discard patch within 12 hours or as directed. 07/09/22  Yes Prosperi, Christian H, PA-C  methocarbamol (ROBAXIN) 500 MG tablet Take 1 tablet (500 mg total) by mouth 2 (two) times daily. 07/09/22  Yes Prosperi, Christian H, PA-C  multivitamin (RENA-VIT) TABS tablet Take 1 tablet by mouth daily.   Yes [provider]  nitroGLYCERIN (NITROSTAT) 0.4 MG SL tablet PLACE 1 TABLET UNDER THE TONGUE EVERY 5 MINUTES AS NEEDED FOR CHEST PAIN. Patient taking differently: Place 0.4 mg under the tongue every 5 (five) minutes as needed for chest pain. 06/28/19  Yes [name], MD  ondansetron (ZOFRAN-ODT) 4 MG disintegrating tablet Take 1 tablet (4 mg total) by mouth every 8 (eight) hours as needed for nausea or vomiting. 07/02/21  Yes Smoot, Leary Roca, PA-C  RENVELA 800 MG tablet Take 1,600 mg by mouth 3 (three) times daily with meals. 07/28/21  Yes [provider]  UNABLE TO FIND Inject 1  Dose into the vein Every Tuesday,Thursday,and Saturday with dialysis. Med Name: VIT D injection/IV.   Yes [provider]  Accu-Chek Softclix Lancets lancets Use as directed to check blood sugar four times daily. 02/24/22   Rick Duff, MD  blood glucose meter kit and supplies KIT Dispense based on patient and insurance preference. Use up to four times daily as directed. (FOR ICD-9 250.00, 250.01). 07/21/18   Katherine Roan, MD  Blood Glucose Monitoring Suppl (ACCU-CHEK GUIDE) w/Device KIT Use to test blood sugar 02/24/22   Rick Duff, MD  Blood Pressure Monitoring (FORA P20 BLOOD PRESSURE CUFF) MISC Check your blood pressure everyday in the morning. 02/24/22   Rick Duff, MD  cloNIDine (CATAPRES) 0.3 MG tablet Take 1 tablet (0.3 mg total) by mouth 2 (two) times daily as directed 08/21/21     ferric citrate (AURYXIA) 1 GM 210 MG(Fe) tablet Take 2 tablets (420  mg total) by mouth as directed with meals & 2 with snacks Patient not taking: Reported on 07/15/2022 02/06/22     ferric citrate (AURYXIA) 1 GM 210 MG(Fe) tablet Take 2 tablets (420 mg total) by mouth 3 (three) times daily with meals, AND 2 tablets (420 mg total) 2 (two) times daily with snacks Patient not taking: Reported on 07/15/2022 03/19/22     glucose blood (ACCU-CHEK GUIDE) test strip USE TO CHECK BLOOD SUGAR 4 TIMES DAILY 02/24/22   Rick Duff, MD  Insulin Pen Needle (PEN NEEDLES) 31G X 5 MM MISC use four times daily with insulin 10/07/21 01/05/22  Gaylan Gerold, DO  Insulin Syringe-Needle U-100 (ULTICARE INSULIN SYRINGE) 30G X 1/2" 0.5 ML MISC USE 4 TIMES DAILY WITH INSULIN INJECTIONS. 10/07/21 10/07/22  Iona Beard, MD     Family History  Problem Relation Age of Onset   Diabetes Mellitus II Mother        died @ 63.   Gastric cancer Mother    CAD Father        died @ 71.   Heart attack Father    Congestive Heart Failure Father    Diabetes Mellitus II Sister    CAD Sister        s/p PCI - age 34.     Social History   Socioeconomic History   Marital status: Single    Spouse name: Not on file   Number of children: Not on file   Years of education: Not on file   Highest education level: Some college, no degree  Occupational History   Not on file  Tobacco Use   Smoking status: Never   Smokeless tobacco: Never  Vaping Use   Vaping Use: Never used  Substance and Sexual Activity   Alcohol use: No    Alcohol/week: 0.0 standard drinks of alcohol   Drug use: Yes    Types: Marijuana    Comment: last used 09/15/2020   Sexual activity: Yes    Partners: Female  Other Topics Concern   Not on file  Social History Narrative   Lives in [place] by himself.  Currently in [place].   Caffeine- none       Social Determinants of Health   Financial Resource Strain: Not on file  Food Insecurity: No Food Insecurity (07/16/2022)   Hunger Vital Sign    Worried About Running Out of Food in the Last Year: Never true    Ran Out of Food in the Last Year: Never true  Transportation Needs: No Transportation Needs (07/16/2022)   PRAPARE - Hydrologist (Medical): No    Lack of Transportation (Non-Medical): No  Physical Activity: Insufficiently Active (11/27/2021)   Exercise Vital Sign    Days of Exercise per Week: 2 days    Minutes of Exercise per Session: 30 min  Stress: Stress Concern Present (11/27/2021)   [place]    Feeling of Stress : To some extent  Social Connections: Not on file    Review of Systems: A 12 point ROS discussed and pertinent positives are indicated in the HPI above.  All other systems are negative.  Vital Signs: BP (!) 162/75   Pulse 82   Temp 98.2 F (36.8 C) (Oral)   Resp 18   Ht _0  (1.956 m)   Wt 277 lb 9 oz (125.9 kg)   SpO2 98%   BMI 32.91 kg/m  Physical Exam Vitals reviewed.  HENT:     Mouth/Throat:     Mouth: Mucous membranes are moist.   Cardiovascular:     Rate and Rhythm: Normal rate and regular rhythm.     Heart sounds: Normal heart sounds.  Pulmonary:     Effort: Pulmonary effort is normal.     Breath sounds: Normal breath sounds.  Abdominal:     Palpations: Abdomen is soft.     Tenderness: There is no abdominal tenderness.  Musculoskeletal:        General: Normal range of motion.  Skin:    General: Skin is warm.  Neurological:     Mental Status: He is alert and oriented to person, place, and time.  Psychiatric:        Behavior: Behavior normal.     Imaging: VAS US DOPPLER PRE CABG  Result Date: 07/19/2022 PREOPERATIVE VASCULAR EVALUATION Patient Name:  JONTAE ADEBAYO  Date of Exam:   07/19/2022 Medical Rec #: [id number]        Accession #:    [id number] Date of Birth: March 09, 1981        Patient Gender: M Patient Age:   60 years Exam Location:  [facility] Procedure:      VAS US DOPPLER PRE CABG Referring Phys: Collier Salina VANTRIGT --------------------------------------------------------------------------------  Indications:      Pre-AVR. Risk Factors:     Hypertension, hyperlipidemia, Diabetes. Other Factors:    Bacteremia with vegetation, ESRD. Comparison Study: No prior studies. Performing Technologist: Darlin Coco RDMS, RVT  Examination Guidelines: A complete evaluation includes B-mode imaging, spectral Doppler, color Doppler, and power Doppler as needed of all accessible portions of each vessel. Bilateral testing is considered an integral part of a complete examination. Limited examinations for reoccurring indications may be performed as noted.  Right Carotid Findings: +----------+--------+--------+--------+--------+--------+           PSV cm/sEDV cm/sStenosisDescribeComments +----------+--------+--------+--------+--------+--------+ CCA Prox  91      12                               +----------+--------+--------+--------+--------+--------+ CCA Distal67      18                                +----------+--------+--------+--------+--------+--------+ ICA Prox  47      18                               +----------+--------+--------+--------+--------+--------+ ICA Mid   85      33                               +----------+--------+--------+--------+--------+--------+ ICA Distal76      24                               +----------+--------+--------+--------+--------+--------+ ECA       149     14                               +----------+--------+--------+--------+--------+--------+ +----------+--------+-------+----------------+------------+           PSV cm/sEDV cmsDescribe        Arm Pressure +----------+--------+-------+----------------+------------+ Subclavian126  Multiphasic, WNL             +----------+--------+-------+----------------+------------+ +---------+--------+--+--------+--+---------+ VertebralPSV cm/s59EDV cm/s17Antegrade +---------+--------+--+--------+--+---------+ Left Carotid Findings: +----------+--------+--------+--------+--------+--------+           PSV cm/sEDV cm/sStenosisDescribeComments +----------+--------+--------+--------+--------+--------+ CCA Prox  93      14                               +----------+--------+--------+--------+--------+--------+ CCA Distal60      15                               +----------+--------+--------+--------+--------+--------+ ICA Prox  54      20                               +----------+--------+--------+--------+--------+--------+ ICA Mid   72      21                               +----------+--------+--------+--------+--------+--------+ ICA Distal94      27                               +----------+--------+--------+--------+--------+--------+ ECA       115     11                               +----------+--------+--------+--------+--------+--------+  +----------+--------+--------+----------------+------------+ SubclavianPSV cm/sEDV  cm/sDescribe        Arm Pressure +----------+--------+--------+----------------+------------+           126             Multiphasic, WNL             +----------+--------+--------+----------------+------------+ +---------+--------+--+--------+--+---------+ VertebralPSV cm/s81EDV cm/s22Antegrade +---------+--------+--+--------+--+---------+  ABI Findings: +--------+------------------+-----+---------+--------+ Right   Rt Pressure (mmHg)IndexWaveform Comment  +--------+------------------+-----+---------+--------+ Brachial                       triphasic         +--------+------------------+-----+---------+--------+ +--------+------------------+-----+---------+-------+ Left    Lt Pressure (mmHg)IndexWaveform Comment +--------+------------------+-----+---------+-------+ Brachial                       triphasic        +--------+------------------+-----+---------+-------+  Right Doppler Findings: +--------+--------+-----+---------+----------------------------------+ Site    PressureIndexDoppler  Comments                           +--------+--------+-----+---------+----------------------------------+ Brachial             triphasic                                   +--------+--------+-----+---------+----------------------------------+ Radial               triphasicDistal stenosis w/ PSV of 503 cm/s +--------+--------+-----+---------+----------------------------------+ Ulnar                triphasic                                   +--------+--------+-----+---------+----------------------------------+  Left Doppler Findings: +--------+--------+-----+---------+--------+ Site  PressureIndexDoppler  Comments +--------+--------+-----+---------+--------+ Brachial             triphasic         +--------+--------+-----+---------+--------+ Radial               triphasic         +--------+--------+-----+---------+--------+ Ulnar                 triphasic         +--------+--------+-----+---------+--------+   Summary: Right Carotid: The extracranial vessels were near-normal with only minimal wall                thickening or plaque. Left Carotid: The extracranial vessels were near-normal with only minimal wall               thickening or plaque. Vertebrals:  Bilateral vertebral arteries demonstrate antegrade flow. Subclavians: Normal flow hemodynamics were seen in bilateral subclavian              arteries. Right Upper Extremity: Doppler waveforms remain within normal limits with right radial compression. Doppler waveform obliterate with right ulnar compression. Incidental finding: Distal right radial artery stenosis with peak systolic velocities of 245 cm/s. Left Upper Extremity: Doppler waveforms remain within normal limits with left radial compression. Doppler waveforms remain within normal limits with left ulnar compression.  Electronically signed by Monica Martinez MD on 07/19/2022 at 1:13:36 PM.    Final    DG Orthopantogram  Result Date: 07/18/2022 CLINICAL DATA:  Severe aortic insufficiency, fever, blood infection EXAM: ORTHOPANTOGRAM/PANORAMIC COMPARISON:  05/10/2012 FINDINGS: Panorex view of the mandible was performed. There are no acute or destructive bony abnormalities. Bilateral impacted lower wisdom teeth again noted. Otherwise no significant dental abnormalities. IMPRESSION: 1. No acute bony abnormality. Electronically Signed   By: Randa Ngo M.D.   On: 07/18/2022 18:50   ECHO TEE  Result Date: 07/18/2022    TRANSESOPHOGEAL ECHO REPORT   Patient Name:   NAGI FURIO Date of Exam: 07/18/2022 Medical Rec #:  [id number]       Height:       77.0 in Accession #:    [id number]      Weight:       265.7 lb Date of Birth:  13-Jan-1981       BSA:          2.524 m Patient Age:    54 years        BP:           140/79 mmHg Patient Gender: M               HR:           74 bpm. Exam Location:  [facility] Procedure: Transesophageal Echo,  Cardiac Doppler, Limited Color Doppler and 3D            Echo Indications:     Bacterimea  History:         Patient has prior history of Echocardiogram examinations, most                  recent 07/16/2022. Cardiomyopathy, Signs/Symptoms:Chest Pain;                  Risk Factors:Hypertension, Diabetes, Dyslipidemia and                  Non-Smoker.  Sonographer:     Greer Pickerel Sonographer#2:   Darlina Sicilian RDCS Referring Phys:  [id number] [name] Diagnosing Phys: [place] PROCEDURE:  After discussion of the risks and benefits of a TEE, an informed consent was obtained from the patient. The transesophogeal probe was passed without difficulty through the esophogus of the patient. Imaged were obtained with the patient in a left lateral decubitus position. Sedation performed by different physician. The patient's vital signs; including heart rate, blood pressure, and oxygen saturation; remained stable throughout the procedure.  IMPRESSIONS  1. Left ventricular ejection fraction, by estimation, is 55 to 60%. The left ventricle has normal function.  2. Right ventricular systolic function is normal. The right ventricular size is normal.  3. No left atrial/left atrial appendage thrombus was detected.  4. 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  5. The mitral valve is normal in structure. No evidence of mitral valve regurgitation.  6. Small defect with non central AI jet concerning for perforation of the [place] approx. 0.2 cm. Small vegetation seen on the [place] (image 59). aortic valve is sclerotic with mild calcification. Central AI jet with VC ~0.67 cm and holodiastolic flow reversal  (image 56) was seen in the descending aorta. PHT 264 ms. Concerning for borderline severe AI.  7. No aortic root abscess. 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 FINDINGS  Left Ventricle: Left ventricular ejection fraction, by estimation, is 55 to 60%. The left ventricle has  normal function. The left ventricular internal cavity size was normal in size. Right Ventricle: The right ventricular size is normal. Right ventricular systolic function is normal. Left Atrium: Left atrial size was normal in size. No left atrial/left atrial appendage thrombus was detected. Right Atrium: Right atrial size was normal in size. 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 Pericardium: There is no evidence of pericardial effusion. Mitral Valve: The mitral valve is normal in structure. No evidence of mitral valve regurgitation. Tricuspid Valve: The tricuspid valve is normal in structure. Tricuspid valve regurgitation is not demonstrated. Aortic Valve: Small defect with non central AI jet concerning for perforation of the NCC approx. 0.2 cm. Small vegetation seen on the [place] (image 59). aortic valve is sclerotic with mild calcification. Central AI jet with VC ~0.67 cm and holodiastolic flow reversal (image 56) was seen in the descending aorta. PHT 264 ms. Concerning for borderline severe AI. Aortic regurgitation PHT measures 264 msec. Pulmonic Valve: The pulmonic valve was normal in structure. Pulmonic valve regurgitation is not visualized. Aorta: No aortic root abscess. IAS/Shunts: No atrial level shunt detected by color flow Doppler. Additional Comments: Spectral Doppler performed. AORTIC VALVE AI PHT:      264 msec Phineas Inches Electronically signed by Phineas Inches Signature Date/Time: 07/18/2022/3:20:15 PM    Final    US RENAL  Result Date: 07/17/2022 CLINICAL DATA:  Evaluate for renal cyst. EXAM: RENAL / URINARY TRACT ULTRASOUND COMPLETE COMPARISON:  07/15/2022 FINDINGS: Right Kidney: Renal measurements: 11.2 x 5.9 x 5.6 cm = volume: 194.8 mL. Echogenicity within normal limits. No mass or hydronephrosis visualized. Left Kidney: Renal measurements: 11.9 x 6.2 x 5.7 cm = volume: 220.5 mL. Anechoic cyst with increased through transmission is identified arising off the interpolar left kidney measuring 3.4 x 2.5 x 2.8  cm. The additional small low density lesions within the inferior pole of the left kidney are not confidently identified on current exam and may be too small to visualize via ultrasound. No suspicious kidney mass identified. Bladder: Appears normal for degree of bladder distention. Other: None. IMPRESSION: 1. No acute findings. 2. Simple appearing left renal cyst. The additional small low density lesions within  the inferior pole of the left kidney are not confidently identified on current exam and may be too small to visualize via ultrasound. No suspicious kidney mass identified. Electronically Signed   By: Kerby Moors M.D.   On: 07/17/2022 15:23   IR Removal Tun Cv Cath W/O FL  Result Date: 07/17/2022 INDICATION: Patient with a tunneled dialysis catheter placed by vascular team. Patient now with bacteremia. Interventional radiology asked to remove tunneled dialysis catheter for line holiday. EXAM: REMOVAL TUNNELED CENTRAL VENOUS CATHETER MEDICATIONS: 1% lidocaine 10 mL ANESTHESIA/SEDATION: None FLUOROSCOPY: None COMPLICATIONS: None immediate. PROCEDURE: Informed written consent was obtained from the patient after a thorough discussion of the procedural risks, benefits and alternatives. All questions were addressed. Maximal Sterile Barrier Technique was utilized including caps, mask, sterile gowns, sterile gloves, sterile drape, hand hygiene and skin antiseptic. A timeout was performed prior to the initiation of the procedure. The patient's left chest and catheter was prepped and draped in a normal sterile fashion. Heparin was removed from both ports of catheter. 1% lidocaine was used for local anesthesia. Using gentle blunt dissection and moderate manual traction the cuff of the catheter was exposed and the catheter was removed in it's entirety. Pressure was held till hemostasis was obtained. A sterile dressing was applied. The patient tolerated the procedure well with no immediate complications. IMPRESSION:  Successful catheter removal as described above. Read by: Soyla Dryer, NP Electronically Signed   By: Miachel Roux M.D.   On: 07/17/2022 11:00   ECHOCARDIOGRAM COMPLETE  Result Date: 07/16/2022    ECHOCARDIOGRAM REPORT   Patient Name:   KELSO BIBBY Date of Exam: 07/16/2022 Medical Rec #:  [id number]       Height:       77.0 in Accession #:    [id number]      Weight:       260.0 lb Date of Birth:  08-22-1981       BSA:          2.501 m Patient Age:    55 years        BP:           144/74 mmHg Patient Gender: M               HR:           90 bpm. Exam Location:  Inpatient Procedure: 2D Echo, Cardiac Doppler and Color Doppler Indications:    Bacteremia  History:        Patient has prior history of Echocardiogram examinations, most                 recent 06/28/2019. Cardiomyopathy, Signs/Symptoms:Chest Pain;                 Risk Factors:Hypertension, Diabetes and Dyslipidemia. Kidney                 disorder. Dialysis catheter.  Sonographer:    Roseanna Rainbow RDCS Referring Phys: [id number] [place]  1. Left ventricular ejection fraction, by estimation, is 50 to 55%. The left ventricle has low normal function. The left ventricle has no regional wall motion abnormalities. There is mild concentric left ventricular hypertrophy. Left ventricular diastolic parameters are consistent with Grade II diastolic dysfunction (pseudonormalization).  2. Right ventricular systolic function is normal. The right ventricular size is normal. Tricuspid regurgitation signal is inadequate for assessing PA pressure.  3. The mitral valve is normal in structure. Trivial mitral valve regurgitation. No evidence of mitral stenosis.  4. Dialysis catheter tip in right atrium.  5. The aortic valve is tricuspid. Aortic valve regurgitation is moderate. No aortic stenosis is present. Cannot fully rule out small vegetation on aortic valve. Suggest TEE to assess the aortic valve.  6. Aortic dilatation noted. There is mild dilatation of the  ascending aorta and of the aortic root, measuring 40 mm.  7. The inferior vena cava is dilated in size with >50% respiratory variability, suggesting right atrial pressure of 8 mmHg. FINDINGS  Left Ventricle: Left ventricular ejection fraction, by estimation, is 50 to 55%. The left ventricle has low normal function. The left ventricle has no regional wall motion abnormalities. The left ventricular internal cavity size was normal in size. There is mild concentric left ventricular hypertrophy. Left ventricular diastolic parameters are consistent with Grade II diastolic dysfunction (pseudonormalization). Right Ventricle: The right ventricular size is normal. No increase in right ventricular wall thickness. Right ventricular systolic function is normal. Tricuspid regurgitation signal is inadequate for assessing PA pressure. Left Atrium: Left atrial size was normal in size. Right Atrium: Dialysis catheter tip in right atrium. Right atrial size was normal in size. Pericardium: There is no evidence of pericardial effusion. Mitral Valve: The mitral valve is normal in structure. Trivial mitral valve regurgitation. No evidence of mitral valve stenosis. Tricuspid Valve: The tricuspid valve is normal in structure. Tricuspid valve regurgitation is not demonstrated. Aortic Valve: The aortic valve is tricuspid. Aortic valve regurgitation is moderate. Aortic regurgitation PHT measures 389 msec. No aortic stenosis is present. Pulmonic Valve: The pulmonic valve was normal in structure. Pulmonic valve regurgitation is not visualized. Aorta: Aortic dilatation noted. There is mild dilatation of the ascending aorta and of the aortic root, measuring 40 mm. Venous: The inferior vena cava is dilated in size with greater than 50% respiratory variability, suggesting right atrial pressure of 8 mmHg. IAS/Shunts: No atrial level shunt detected by color flow Doppler.  LEFT VENTRICLE PLAX 2D LVIDd:         5.60 cm LVIDs:         4.00 cm LV PW:          1.40 cm LV IVS:        1.20 cm LVOT diam:     2.80 cm LV SV:         145 LV SV Index:   58 LVOT Area:     6.16 cm  LV Volumes (MOD) LV vol d, MOD A2C: 198.0 ml LV vol d, MOD A4C: 206.5 ml LV vol s, MOD A2C: 84.2 ml LV vol s, MOD A4C: 91.5 ml LV SV MOD A2C:     113.8 ml LV SV MOD A4C:     206.5 ml LV SV MOD BP:      119.6 ml RIGHT VENTRICLE             IVC RV S prime:     14.00 cm/s  IVC diam: 2.40 cm TAPSE (M-mode): 2.0 cm LEFT ATRIUM             Index        RIGHT ATRIUM          Index LA diam:        4.00 cm 1.60 cm/m   RA Area:     7.04 cm LA Vol (A2C):   70.5 ml 28.19 ml/m  RA Volume:   10.90 ml 4.36 ml/m LA Vol (A4C):   74.2 ml 29.67 ml/m LA Biplane Vol: 79.8 ml 31.91 ml/m  AORTIC VALVE LVOT Vmax:   127.00 cm/s LVOT Vmean:  81.800 cm/s LVOT VTI:    0.236 m AI PHT:      389 msec  AORTA Ao Root diam: 3.90 cm Ao Asc diam:  3.80 cm MITRAL VALVE MV Area (PHT): 4.43 cm    SHUNTS MV Decel Time: 171 msec    Systemic VTI:  0.24 m MV E velocity: 79.13 cm/s  Systemic Diam: 2.80 cm MV A velocity: 87.47 cm/s MV E/A ratio:  0.90 Dalton McleanMD Electronically signed by Franki Monte Signature Date/Time: 07/16/2022/5:14:52 PM    Final    MR Lumbar Spine W Wo Contrast  Result Date: 07/15/2022 CLINICAL DATA:  Low back pain, infection suspected EXAM: MRI LUMBAR SPINE WITHOUT AND WITH CONTRAST TECHNIQUE: Multiplanar and multiecho pulse sequences of the lumbar spine were obtained without and with intravenous contrast. CONTRAST:  46m GADAVIST GADOBUTROL 1 MMOL/ML IV SOLN COMPARISON:  No prior MRI, correlation is made with CT lumbar spine 07/15/2022 FINDINGS: Segmentation:  Standard. Alignment: Straightening of the normal lumbar lordosis. No listhesis. Vertebrae: No acute fracture or suspicious osseous lesion. Increased T2 signal and minimal contrast enhancement at the anterior endplates about LI1-W4 without definite abnormal signal in the disc space. Congenitally short pedicles, which narrow the AP diameter of the  spinal canal. Conus medullaris and cauda equina: Conus extends to the T12-L1 level. Conus and cauda equina appear normal. Paraspinal and other soft tissues: Negative. Disc levels: T12-L1: No significant disc bulge. Mild facet arthropathy. No spinal canal stenosis. No neural foraminal narrowing. L1-L2: No significant disc bulge. No spinal canal stenosis or neural foraminal narrowing. L2-L3: Minimal disc bulge. Mild facet arthropathy. No spinal canal stenosis. No neural foraminal narrowing. L3-L4: Minimal disc bulge. Mild facet arthropathy. Mild spinal canal stenosis. Narrowing of the lateral recesses. No neural foraminal narrowing. L4-L5: Mild disc bulge. Mild facet arthropathy. No spinal canal stenosis. Narrowing of the lateral recesses. No neural foraminal narrowing. L5-S1: Minimal disc bulge. Mild facet arthropathy. No spinal canal stenosis. No neural foraminal narrowing. IMPRESSION: 1. L3-L4 mild spinal canal stenosis. Narrowing of the lateral recesses at this level could affect the descending L4 nerve roots. 2. Narrowing of the lateral recesses at L4-L5 could affect the descending L5 nerve roots. 3. No neural foraminal narrowing. 4. Edema in the anterior endplates about the LR1-V4disc space, without definite abnormal signal in the disc, favored to be degenerative, although early discitis osteomyelitis can appear similar. Correlate with lab values and consider short interval follow-up. Electronically Signed   By: AMerilyn BabaM.D.   On: 07/15/2022 19:13   CT Angio Chest PE W and/or Wo Contrast  Result Date: 07/15/2022 CLINICAL DATA:  Pulmonary embolism suspected. 150 mL of Omnipaque 350. EXAM: CT ANGIOGRAPHY CHEST WITH CONTRAST TECHNIQUE: Multidetector CT imaging of the chest was performed using the standard protocol during bolus administration of intravenous contrast. Multiplanar CT image reconstructions and MIPs were obtained to evaluate the vascular anatomy. RADIATION DOSE REDUCTION: This exam was  performed according to the departmental dose-optimization program which includes automated exposure control, adjustment of the mA and/or kV according to patient size and/or use of iterative reconstruction technique. CONTRAST:  1545mOMNIPAQUE IOHEXOL 350 MG/ML SOLN COMPARISON:  None Available. FINDINGS: Cardiovascular: Satisfactory opacification of the pulmonary arteries to the segmental level. No evidence of pulmonary embolism. Normal heart size. No pericardial effusion. Left IJ access double-lumen catheter with distal tip in the right atrium. Mediastinum/Nodes: No enlarged mediastinal, hilar, or axillary lymph nodes. Thyroid gland, trachea, and  esophagus demonstrate no significant findings. Lungs/Pleura: Ground-glass attenuation of the lung parenchyma prominent in the dependent portions of bilateral lower lobes concerning for airspace disease. Upper Abdomen: Cholelithiasis without evidence of acute cholecystitis. Musculoskeletal: Cortical irregularity of the left fifth rib posteriorly, likely a chronic fracture. Mild multilevel degenerate disc disease of the thoracic spine. Review of the MIP images confirms the above findings. IMPRESSION: 1. No evidence of pulmonary embolism. 2. Ground-glass attenuation of the lung parenchyma prominent in the dependent portions of bilateral lower lobes concerning for airspace disease. 3. Cholelithiasis without evidence of acute cholecystitis. 4. Cortical irregularity of the left fifth rib posteriorly, likely a chronic fracture. Correlate with localized pain. Electronically Signed   By: Keane Police D.O.   On: 07/15/2022 16:57   CT L-SPINE NO CHARGE  Result Date: 07/15/2022 CLINICAL DATA:  Cough, abdominal pain/back pain, right-sided leg pain. EXAM: CT LUMBAR SPINE WITHOUT CONTRAST TECHNIQUE: Multidetector CT imaging of the lumbar spine was performed without intravenous contrast administration. Multiplanar CT image reconstructions were also generated. RADIATION DOSE REDUCTION:  This exam was performed according to the departmental dose-optimization program which includes automated exposure control, adjustment of the mA and/or kV according to patient size and/or use of iterative reconstruction technique. COMPARISON:  Radiographs 07/09/2022 FINDINGS: Segmentation: The lowest lumbar type non-rib-bearing vertebra is labeled as L5. Alignment: No vertebral subluxation is observed. Vertebrae: Congenitally short pedicles in the lumbar spine. Schmorl's nodes and endplate sclerosis at G2-5 with some vacuum disc phenomenon loss of intervertebral disc height at the L4-5 level. Similar findings were also present on the CT pelvis from/25/16. No lumbar spine fracture or acute bony findings. Paraspinal and other soft tissues: No significant paraspinal edema. Otherwise please see dedicated CT abdomen report. Disc levels: T12-L1: Mild bilateral foraminal impingement due to short pedicles. L1-2: No impingement. L2-3: Mild bilateral foraminal stenosis and mild central narrowing of the thecal sac due to short pedicles and disc bulge. Faint calcification along the posterior annulus fibrosis of the intervertebral disc. L3-4: Moderate central narrowing of the thecal sac and mild to moderate bilateral foraminal stenosis due to disc bulge and short pedicles. L4-5: Moderate central narrowing of the thecal sac and mild bilateral foraminal stenosis due to disc bulge and short pedicles. Calcification along the posterior annulus fibrosis of the intervertebral disc. L5-S1: Mild to moderate bilateral foraminal stenosis and borderline central narrowing of the thecal sac due to short pedicles and degenerative facet arthropathy. Bilateral facet sclerosis is present, right greater than left. No impingement is identified along the sacral plexus. IMPRESSION: 1. Lumbar spondylosis, congenitally short pedicles, and degenerative disc disease, causing moderate impingement at L3-4 and L4-5; mild to moderate impingement at L5-S1; and  mild impingement at T12-L1 and L2-3. Electronically Signed   By: Van Clines M.D.   On: 07/15/2022 16:50   CT ABDOMEN PELVIS W CONTRAST  Result Date: 07/15/2022 CLINICAL DATA:  Acute generalized abdominal pain, sepsis. EXAM: CT ABDOMEN AND PELVIS WITH CONTRAST TECHNIQUE: Multidetector CT imaging of the abdomen and pelvis was performed using the standard protocol following bolus administration of intravenous contrast. RADIATION DOSE REDUCTION: This exam was performed according to the departmental dose-optimization program which includes automated exposure control, adjustment of the mA and/or kV according to patient size and/or use of iterative reconstruction technique. CONTRAST:  121m OMNIPAQUE IOHEXOL 350 MG/ML SOLN COMPARISON:  Jan 31, 2015.  October 09, 2019. FINDINGS: Lower chest: Mild left basilar atelectasis or infiltrate is noted. Hepatobiliary: Mild cholelithiasis is noted. No biliary dilatation is noted. No focal  hepatic abnormality is noted. Pancreas: Unremarkable. No pancreatic ductal dilatation or surrounding inflammatory changes. Spleen: Normal in size without focal abnormality. Adrenals/Urinary Tract: Right adrenal gland appears normal. 2.1 cm left adrenal nodule is noted with average Hounsfield measurement of 38. No hydronephrosis or renal obstruction is noted. 2.8 cm cyst seen in upper pole of left kidney. Two smaller low densities are noted in lower pole which may represent cysts, but renal ultrasound is recommended for confirmation. Urinary bladder is unremarkable. Stomach/Bowel: Stomach is within normal limits. Appendix appears normal. No evidence of bowel wall thickening, distention, or inflammatory changes. Vascular/Lymphatic: No significant vascular findings are present. No enlarged abdominal or pelvic lymph nodes. Reproductive: Prostate is unremarkable. Other: No abdominal wall hernia or abnormality. No abdominopelvic ascites. Musculoskeletal: No acute or significant osseous  findings. IMPRESSION: Mild left basilar atelectasis or infiltrate is noted. Mild cholelithiasis without evidence of cholecystitis. 2.1 cm left adrenal nodule is noted. Follow-up CT scan or MRI in 12 months is recommended to ensure stability and rule out neoplasm. 2.8 cm simple cyst is seen in upper pole of left kidney. Two smaller low densities are noted in the lower pole of left kidney which may represent cyst, but renal ultrasound is recommended for confirmation and to rule out neoplasm. Electronically Signed   By: Marijo Conception M.D.   On: 07/15/2022 16:50   DG Chest 2 View  Result Date: 07/15/2022 CLINICAL DATA:  Suspected sepsis.  Fever. EXAM: CHEST - 2 VIEW COMPARISON:  07/02/2021 FINDINGS: Central line placed from the left has its tip in the proximal right atrium. Heart size is normal. Mildly tortuous aorta. The lungs are clear. No effusions. Old left-sided rib fracture and mild curvature of the spine as seen previously. IMPRESSION: No active cardiopulmonary disease. Central line tip in the proximal right atrium. Electronically Signed   By: Nelson Chimes M.D.   On: 07/15/2022 13:53   DG Lumbar Spine Complete  Result Date: 07/09/2022 CLINICAL DATA:  Right-sided shooting leg pain EXAM: LUMBAR SPINE - COMPLETE 4+ VIEW COMPARISON:  None Available. FINDINGS: Straightening of the normal lumbar lordosis. Mild disc space loss in the lower lumbar spine with anterior osteophytes at L4 and L5. Vertebral body heights are preserved. There is mild-to-moderate lower lumbar spine predominant facet degenerative change. Moderate colonic stool burden. No dilated loops of small bowel are visualized. IMPRESSION: Mild lower lumbar spine predominant degenerative change. Electronically Signed   By: Marin Roberts M.D.   On: 07/09/2022 11:44    Labs:  CBC: Recent Labs    07/16/22 0139 07/17/22 0712 07/18/22 0335 07/19/22 0247  WBC 12.3* 10.9* 9.6 12.0*  HGB 8.8* 9.2* 8.1* 8.6*  HCT 27.5* 29.2* 26.3* 27.6*  PLT  320 312 288 314    COAGS: Recent Labs    07/15/22 1228 07/15/22 1315  INR  --  1.2  APTT 31  --     BMP: Recent Labs    07/17/22 0712 07/18/22 0335 07/19/22 0247 07/20/22 0652  NA 134* 134* 140 138  K 3.4* 3.9 4.2 4.6  CL 99 98 104 104  CO2 _0 20*  GLUCOSE 144* 160* 95 145*  BUN 42* 52* 57* 63*  CALCIUM 8.6* 8.1* 8.6* 8.6*  CREATININE 7.41* 9.76* 11.09* 11.77*  GFRNONAA 9* 6* 5* 5*    LIVER FUNCTION TESTS: Recent Labs    07/15/22 1315 07/16/22 0139 07/17/22 0712 07/18/22 0335 07/19/22 0247  BILITOT 0.8  --   --   --  0.6  AST 25  --   --   --  16  ALT 26  --   --   --  18  ALKPHOS 55  --   --   --  34*  PROT 7.7  --   --   --  6.7  ALBUMIN 2.8* 2.4* 2.5* 2.2* 2.3*  2.3*    TUMOR MARKERS: No results for input(s): "AFPTM", "CEA", "CA199", "CHROMGRNA" in the last 8760 hours.  Assessment and Plan:  Scheduled for tunneled HD catheter placement in IR 11/13 Possible tunneled central catheter placement Risks and benefits discussed with the patient including, but not limited to bleeding, infection, vascular injury, pneumothorax which may require chest tube placement, air embolism or even death  All of the patient's questions were answered, patient is agreeable to proceed. Consent signed and in chart.  Thank you for this interesting consult.  I greatly enjoyed meeting Fred Franzen and look forward to participating in their care.  A copy of this report was sent to the requesting provider on this date.  Electronically Signed: Lavonia Drafts, PA-C 07/20/2022, 9:53 AM   I spent a total of 20 Minutes    in face to face in clinical consultation, greater than 50% of which was counseling/coordinating care for tunn dialysis catheterplacement

## 2022-07-20 NOTE — Progress Notes (Signed)
Nephrology Follow-Up Consult note   Assessment/Recommendations: Jeremy [name] is a/an 41 y.o. male with a past medical history significant for ESRD, admitted for bacteremia.     Subjective: K+ 4.6 today. Pt stable. Creat is 11.7. No SOB.    Physical Exam: Constitutional: well-appearing, no acute distress ENMT: ears and nose without scars or lesions, MMM CV: normal rate, no edema Respiratory: no rales or wheezing, normal work of breathing Gastrointestinal: soft, non-tender, no palpable masses or hernias Skin: no visible lesions or rashes Psych: alert, judgement/insight appropriate, appropriate mood and affect  [facility] HD: [facility]  4h   400/800  124kg  2/2.5 bath  TDC  Hep 5000+ 4000 Mircera 30mcg IV q 4 weeks Venofer 50mg  weekly Hectorol 60mcg IV q HD   Assessment/Plan:  E faecalis bacteremia: Infectious disease on board, getting IV ampicillin. [place] removed on 07/17/2022.  Plan for IR replacement of catheter Monday 11/13.   ESRD:  TTS schedule. Had last HD here on 11/8. Plan is for next HD off schedule tomorrow after catheter placement  Hypertension/volume: Some GG changes in BL lower lobes on chest CT 11/07, suspected to have been volume related. 1.5kg up today, no O2, no vol excess on exam. UF 2-3 L w/ next HD. Adjust dry wt down if possible.   Anemia: Hgb ~8- 9. Last ESA dose given 06/26/22, will start aranesp here  Metabolic bone disease: Phos controlled, calcium at goal. Continue binders and hectorol.   Nutrition:  Albumin low, continue renal diet and protein supplements T2DM: On insulin  Kelly Splinter, MD 07/20/2022, 4:41 PM  Recent Labs  Lab 07/18/22 0335 07/19/22 0247 07/20/22 0652  HGB 8.1* 8.6* 9.0*  ALBUMIN 2.2* 2.3*  2.3*  --   CALCIUM 8.1* 8.6* 8.6*  PHOS 4.6 5.5*  --   CREATININE 9.76* 11.09* 11.77*  K 3.9 4.2 4.6     Inpatient medications:  acetaminophen  1,000 mg Oral Q6H   amLODipine  10 mg Oral Daily   carvedilol  37.5 mg Oral BID WC   Chlorhexidine  Gluconate Cloth  6 each Topical Q0600   cloNIDine  0.3 mg Oral BID   heparin  5,000 Units Subcutaneous Q8H   hydrALAZINE  100 mg Oral TID   insulin aspart  12 Units Subcutaneous TID WC   insulin glargine-yfgn  25 Units Subcutaneous QHS   lidocaine  1 patch Transdermal Q24H   methocarbamol  500 mg Oral TID   pantoprazole  20 mg Oral Daily   polyethylene glycol  17 g Oral Daily   senna-docusate  1 tablet Oral BID   sevelamer carbonate  1,600 mg Oral TID WC   sodium chloride flush  10-40 mL Intracatheter Q12H    ampicillin (OMNIPEN) IV 2 g (07/20/22 1500)   cefTRIAXone (ROCEPHIN)  IV 2 g (07/20/22 1030)   alum & mag hydroxide-simeth, Muscle Rub, ondansetron (ZOFRAN) IV, sodium chloride flush

## 2022-07-21 ENCOUNTER — Inpatient Hospital Stay (HOSPITAL_COMMUNITY): Payer: Medicaid Other

## 2022-07-21 DIAGNOSIS — I330 Acute and subacute infective endocarditis: Secondary | ICD-10-CM | POA: Diagnosis not present

## 2022-07-21 DIAGNOSIS — I358 Other nonrheumatic aortic valve disorders: Secondary | ICD-10-CM | POA: Diagnosis not present

## 2022-07-21 DIAGNOSIS — N186 End stage renal disease: Secondary | ICD-10-CM | POA: Diagnosis not present

## 2022-07-21 DIAGNOSIS — R7881 Bacteremia: Secondary | ICD-10-CM | POA: Diagnosis not present

## 2022-07-21 DIAGNOSIS — T80211A Bloodstream infection due to central venous catheter, initial encounter: Secondary | ICD-10-CM

## 2022-07-21 DIAGNOSIS — Z5181 Encounter for therapeutic drug level monitoring: Secondary | ICD-10-CM | POA: Diagnosis not present

## 2022-07-21 DIAGNOSIS — I120 Hypertensive chronic kidney disease with stage 5 chronic kidney disease or end stage renal disease: Secondary | ICD-10-CM | POA: Diagnosis not present

## 2022-07-21 DIAGNOSIS — Z01818 Encounter for other preprocedural examination: Secondary | ICD-10-CM

## 2022-07-21 DIAGNOSIS — I351 Nonrheumatic aortic (valve) insufficiency: Secondary | ICD-10-CM | POA: Diagnosis not present

## 2022-07-21 DIAGNOSIS — A498 Other bacterial infections of unspecified site: Secondary | ICD-10-CM | POA: Diagnosis not present

## 2022-07-21 DIAGNOSIS — B999 Unspecified infectious disease: Secondary | ICD-10-CM | POA: Diagnosis not present

## 2022-07-21 DIAGNOSIS — B952 Enterococcus as the cause of diseases classified elsewhere: Secondary | ICD-10-CM

## 2022-07-21 DIAGNOSIS — I5020 Unspecified systolic (congestive) heart failure: Secondary | ICD-10-CM

## 2022-07-21 DIAGNOSIS — N250 Renal osteodystrophy: Secondary | ICD-10-CM | POA: Diagnosis not present

## 2022-07-21 DIAGNOSIS — Z992 Dependence on renal dialysis: Secondary | ICD-10-CM | POA: Diagnosis not present

## 2022-07-21 DIAGNOSIS — R5081 Fever presenting with conditions classified elsewhere: Secondary | ICD-10-CM | POA: Diagnosis not present

## 2022-07-21 DIAGNOSIS — Z4901 Encounter for fitting and adjustment of extracorporeal dialysis catheter: Secondary | ICD-10-CM | POA: Diagnosis not present

## 2022-07-21 DIAGNOSIS — T80211D Bloodstream infection due to central venous catheter, subsequent encounter: Secondary | ICD-10-CM | POA: Diagnosis not present

## 2022-07-21 DIAGNOSIS — D631 Anemia in chronic kidney disease: Secondary | ICD-10-CM | POA: Diagnosis not present

## 2022-07-21 HISTORY — PX: IR FLUORO GUIDE CV LINE LEFT: IMG2282

## 2022-07-21 HISTORY — PX: IR US GUIDE VASC ACCESS LEFT: IMG2389

## 2022-07-21 LAB — BASIC METABOLIC PANEL
Anion gap: 16 — ABNORMAL HIGH (ref 5–15)
BUN: 67 mg/dL — ABNORMAL HIGH (ref 6–20)
CO2: 19 mmol/L — ABNORMAL LOW (ref 22–32)
Calcium: 8.4 mg/dL — ABNORMAL LOW (ref 8.9–10.3)
Chloride: 101 mmol/L (ref 98–111)
Creatinine, Ser: 11.98 mg/dL — ABNORMAL HIGH (ref 0.61–1.24)
GFR, Estimated: 5 mL/min — ABNORMAL LOW (ref >60)
Glucose, Bld: 103 mg/dL — ABNORMAL HIGH (ref 70–99)
Potassium: 4.3 mmol/L (ref 3.5–5.1)
Sodium: 136 mmol/L (ref 135–145)

## 2022-07-21 LAB — GLUCOSE, CAPILLARY
Glucose-Capillary: 101 mg/dL — ABNORMAL HIGH (ref 70–99)
Glucose-Capillary: 109 mg/dL — ABNORMAL HIGH (ref 70–99)
Glucose-Capillary: 167 mg/dL — ABNORMAL HIGH (ref 70–99)
Glucose-Capillary: 126 mg/dL — ABNORMAL HIGH (ref 70–99)

## 2022-07-21 LAB — CBC
HCT: 28.1 % — ABNORMAL LOW (ref 39.0–52.0)
Hemoglobin: 8.8 g/dL — ABNORMAL LOW (ref 13.0–17.0)
MCH: 26.5 pg (ref 26.0–34.0)
MCHC: 31.3 g/dL (ref 30.0–36.0)
MCV: 84.6 fL (ref 80.0–100.0)
Platelets: 340 10*3/uL (ref 150–400)
RBC: 3.32 MIL/uL — ABNORMAL LOW (ref 4.22–5.81)
RDW: 16.1 % — ABNORMAL HIGH (ref 11.5–15.5)
WBC: 12.9 10*3/uL — ABNORMAL HIGH (ref 4.0–10.5)
nRBC: 0 % (ref 0.0–0.2)

## 2022-07-21 MED ORDER — HEPARIN SODIUM (PORCINE) 1000 UNIT/ML IJ SOLN
INTRAMUSCULAR | Status: AC
  Filled 2022-07-21: qty 5

## 2022-07-21 MED ORDER — CHLORHEXIDINE GLUCONATE CLOTH 2 % EX PADS
6.0000 | MEDICATED_PAD | Freq: Every day | CUTANEOUS | Status: DC
  Administered 2022-07-22: 6 via TOPICAL

## 2022-07-21 MED ORDER — MIDAZOLAM HCL 2 MG/2ML IJ SOLN
INTRAMUSCULAR | Status: AC
  Filled 2022-07-21: qty 2

## 2022-07-21 MED ORDER — HEPARIN SOD (PORK) LOCK FLUSH 10 UNIT/ML IV SOLN
INTRAVENOUS | Status: AC | PRN
  Administered 2022-07-21 (×2): 2.1 mL

## 2022-07-21 MED ORDER — FENTANYL CITRATE (PF) 100 MCG/2ML IJ SOLN
INTRAMUSCULAR | Status: AC | PRN
  Administered 2022-07-21: 50 ug via INTRAVENOUS
  Administered 2022-07-21 (×2): 25 ug via INTRAVENOUS

## 2022-07-21 MED ORDER — LIDOCAINE-EPINEPHRINE 1 %-1:100000 IJ SOLN
INTRAMUSCULAR | Status: AC
  Filled 2022-07-21: qty 1

## 2022-07-21 MED ORDER — HEPARIN SODIUM (PORCINE) 1000 UNIT/ML IJ SOLN
INTRAMUSCULAR | Status: AC
  Filled 2022-07-21: qty 10

## 2022-07-21 MED ORDER — MIDAZOLAM HCL 2 MG/2ML IJ SOLN
INTRAMUSCULAR | Status: AC | PRN
  Administered 2022-07-21: 1 mg via INTRAVENOUS
  Administered 2022-07-21 (×3): .5 mg via INTRAVENOUS

## 2022-07-21 MED ORDER — FENTANYL CITRATE (PF) 100 MCG/2ML IJ SOLN
INTRAMUSCULAR | Status: AC
  Filled 2022-07-21: qty 2

## 2022-07-21 MED ORDER — MIDAZOLAM HCL 2 MG/2ML IJ SOLN
INTRAMUSCULAR | Status: AC | PRN
  Administered 2022-07-21: .5 mg via INTRAVENOUS

## 2022-07-21 NOTE — Progress Notes (Signed)
Nephrology Follow-Up Consult note   Assessment/Recommendations: Jeremy [name] is a/an 41 y.o. male with a past medical history significant for ESRD, admitted for bacteremia.     Subjective: K+ 4.6 today. Pt stable. Creat is 11.7. No SOB.    Physical Exam: Constitutional: well-appearing, no acute distress ENMT: ears and nose without scars or lesions, MMM CV: normal rate, no edema Respiratory: no rales or wheezing, normal work of breathing Gastrointestinal: soft, non-tender, no palpable masses or hernias Skin: no visible lesions or rashes Psych: alert, judgement/insight appropriate, appropriate mood and affect  [facility] HD: [facility]  4h   400/800  124kg  2/2.5 bath  TDC  Hep 5000+ 4000 Mircera 12mcg IV q 4 weeks Venofer 50mg  weekly Hectorol 27mcg IV q HD   Assessment/Plan:  E faecalis bacteremia: Infectious disease on board, getting IV ampicillin. [place] removed on 07/17/2022. F/u cx's are negative x 72 hrs. Plan for IR [facility] replacement today.   ESRD:  TTS schedule. Had last HD here on 11/8. Plan is for next HD off schedule today after line placement. Plan short HD tomorrow to get back on schedule.   Hypertension/volume: Some GG changes in BL lower lobes on chest CT 11/07, suspected to have been volume related. 1 kg up today, no O2, no vol excess on exam. UF 2-3 L w/ next HD. Adjust dry wt down if possible.   Anemia: Hgb ~8- 9. Last ESA dose given 06/26/22, will start aranesp here  Metabolic bone disease: Phos controlled, calcium at goal. Continue binders and hectorol.   Nutrition:  Albumin low, continue renal diet and protein supplements T2DM: On insulin  Kelly Splinter, MD 07/21/2022, 2:45 PM  Recent Labs  Lab 07/18/22 0335 07/19/22 0247 07/20/22 0652 07/21/22 0331  HGB 8.1* 8.6* 9.0* 8.8*  ALBUMIN 2.2* 2.3*  2.3*  --   --   CALCIUM 8.1* 8.6* 8.6* 8.4*  PHOS 4.6 5.5*  --   --   CREATININE 9.76* 11.09* 11.77* 11.98*  K 3.9 4.2 4.6 4.3     Inpatient medications:  acetaminophen   1,000 mg Oral Q6H   amLODipine  10 mg Oral Daily   carvedilol  37.5 mg Oral BID WC   Chlorhexidine Gluconate Cloth  6 each Topical Q0600   Chlorhexidine Gluconate Cloth  6 each Topical Q0600   cloNIDine  0.3 mg Oral BID   heparin  5,000 Units Subcutaneous Q8H   heparin sodium (porcine)       hydrALAZINE  100 mg Oral TID   insulin aspart  12 Units Subcutaneous TID WC   insulin glargine-yfgn  25 Units Subcutaneous QHS   lidocaine  1 patch Transdermal Q24H   lidocaine-EPINEPHrine       methocarbamol  500 mg Oral TID   pantoprazole  20 mg Oral Daily   polyethylene glycol  17 g Oral Daily   senna-docusate  1 tablet Oral BID   sevelamer carbonate  1,600 mg Oral TID WC   sodium chloride flush  10-40 mL Intracatheter Q12H    ampicillin (OMNIPEN) IV 2 g (07/21/22 0211)   cefTRIAXone (ROCEPHIN)  IV 2 g (07/21/22 1102)   alum & mag hydroxide-simeth, heparin sodium (porcine), lidocaine-EPINEPHrine, midazolam, Muscle Rub, ondansetron (ZOFRAN) IV, sodium chloride flush

## 2022-07-21 NOTE — TOC Progression Note (Signed)
Transition of Care [facility]) - Progression Note    Patient Details  Name: Jeremy [name] MRN: [id number] Date of Birth: 05-Dec-1980  [facility] CM/SW Contact  Pollie Friar, RN Phone Number: 07/21/2022, 3:52 PM  Clinical Narrative:    Per notes pt is going to require several weeks of IV abx. CM has spoken to him and he states he has support at home.  CM has updated Pam with [employer] to discuss with ID and order the IV abx for home and do education with the patient and his spouse.  Pam will obtain [facility] RN for home.  TOC following.   Expected Discharge Plan: OP Rehab Barriers to Discharge: Continued Medical Work up  Expected Discharge Plan and Services Expected Discharge Plan: OP Rehab   Discharge Planning Services: CM Consult Post Acute Care Choice: Durable Medical Equipment Living arrangements for the past 2 months: Single Family Home                 DME Arranged: Walker rolling DME Agency: [employer] Date DME Agency Contacted: 07/18/22   Representative spoke with at DME Agency: [place]             Social Determinants of Health (SDOH) Interventions Food Insecurity Interventions: Intervention Not Indicated Housing Interventions: Intervention Not Indicated Utilities Interventions: Intervention Not Indicated  Readmission Risk Interventions     No data to display

## 2022-07-21 NOTE — Consult Note (Signed)
[facility] of Dental Medicine      INPATIENT CONSULTATION    PLAN/RECOMMENDATIONS     ASSESSMENT: There are no current signs of acute odontogenic infection including abscess, edema or erythema, or suspicious lesion requiring biopsy.   Caries; periodontal concerns that include severe bone loss, tooth mobility, accretions on teeth     RECOMMENDATIONS: Extractions of all indicated teeth to decrease the risk of perioperative and postoperative systemic infection and complications.   Establish dental care at an outside office of the patient's choice for routine care including cleanings/periodontal therapy and periodic exams.     PLAN: Discuss case with medical team and coordinate treatment as needed. Plan to schedule the patient for the O.R. as outpatient for all indicated dental treatment under general anesthesia (possible date November 30th).  Discussed in detail all treatment options and recommendations with the patient and they are agreeable to the plan.   Thank you for consulting with Hospital Dentistry and for the opportunity to participate in this patient's treatment.  Should you have any questions or concerns, please contact the [facility] at [phone].    Service Date:   07/21/2022 Admit Date:   07/15/2022  Patient Name:   Zaccai Chavarin Date of Birth:   30-Jul-1981 Medical Record Number: [id number]  Referring Provider:          Dahlia Byes, MD   HISTORY OF PRESENT ILLNESS: Trevone Prestwood is a very pleasant 41 y.o. male with history of morbid obesity, diabetes, hypertension and is on dialysis (past 3 years) who is currently admitted for severe aortic valve insufficiency and aortic valve endocarditis.   Hospital dentistry was consulted to complete a medically-necessary dental evaluation as part of the patient's pre-cardiac surgery work-up work-up.   DENTAL HISTORY: The patient reports that he last saw a dentist about 6 months  ago for some tooth pain he had been having. He reports that he was planning to have several molars extracted, however he started to have multiple medical issues that developed and never had extractions done. He does not have a dentist he sees regularly. He currently denies any dental/orofacial pain or sensitivity. Patient is able to manage oral secretions.  Patient denies dysphagia, odynophagia and dysphonia.   CHIEF COMPLAINT:  Preoperative dental consult.   Patient Active Problem List   Diagnosis Date Noted   Acute midline low back pain with right-sided sciatica 07/20/2022   ESRD (end stage renal disease) ([place]) 07/20/2022   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   Nonrheumatic aortic valve insufficiency 07/18/2022   Acute bacterial endocarditis 07/17/2022   Enterococcal bacteremia 07/16/2022   Catheter-related bloodstream infection 07/16/2022   Enterococcus faecalis infection 07/16/2022   Fever 07/15/2022   Unspecified protein-calorie malnutrition ([place]) 10/27/2019   Anemia, unspecified 10/26/2019   Acute kidney failure, unspecified () 10/21/2019   Coagulation defect, unspecified ([place]) 10/21/2019   Personal history of COVID-19 10/21/2019   Secondary hyperparathyroidism of renal origin ([place]) 10/21/2019   COVID-19 virus infection    Mass of soft tissue of face 09/23/2018   Onychomycosis due to dermatophyte 03/29/2018   Anemia, iron deficiency 07/18/2016   Anemia in chronic kidney disease 07/18/2016   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, stage 5 ([place]) 05/29/2016   Suspected sleep apnea 05/24/2015   Chronic combined systolic and diastolic CHF (congestive heart failure) ([place]) 03/01/2015   Nonischemic cardiomyopathy ([place])    Chest pain 01/09/2015   Asthma    Hyperlipidemia 03/31/2012   Obesity, Class I,  BMI 30-34.9 03/21/2011   Primary hypertension 03/21/2011   Gastroesophageal reflux disease 03/21/2011   Type 2 diabetes mellitus with diabetic  nephropathy ([place]) 02/20/2011   Past Medical History:  Diagnosis Date   Abscess of left groin    Acute blood loss anemia 11/11/2013   Anemia in chronic kidney disease (CKD)    Asthma    Boil of scrotum 11/21/2015   Chest pain    a. 01/2015 Lexiscan MV: EF 28%, inferior, inferolateral, apical ischemia;  b. 01/2015 Cath: nl cors, PCWP 18 mmHg, CO 9.38 L/min, CI 3.53 L/min/m^2.   CHF (congestive heart failure) (HCC)    Depression    Situational   ESRD (end stage renal disease) ([place])    TTHS- [place]   Essential hypertension    Family history of adverse reaction to anesthesia    sister- "it was too much for her heart" died   GERD (gastroesophageal reflux disease)    Hyperlipidemia    Membranous glomerulonephritis    Morbid obesity ([place])    Nonischemic cardiomyopathy ([place])    a. 01/2015 Echo: EF 20-25%, diff HK, Gr 2 DD, Triv AI, mildly dil LA and Ao root.   PONV (postoperative nausea and vomiting)    Seizures ([place]) 02/29/2020   "electralytes were out of wack."   Type II diabetes mellitus ([place])    a. 01/2015 HbA1c = 8.9.   Past Surgical History:  Procedure Laterality Date   A/V FISTULAGRAM N/A 08/12/2021   Procedure: A/V FISTULAGRAM;  Surgeon: Waynetta Sandy, MD;  Location: [facility];  Service: Cardiovascular;  Laterality: N/A;   AV FISTULA PLACEMENT Right 12/18/2020   Procedure: RIGHT ARM RADIOCEPHALIC  ARTERIOVENOUS (AV) FISTULA CREATION;  Surgeon: Waynetta Sandy, MD;  Location: [facility];  Service: Vascular;  Laterality: Right;   CARDIAC CATHETERIZATION N/A 02/01/2015   Procedure: Right/Left Heart Cath and Coronary Angiography;  Surgeon: Josue Hector, MD;  Location: [facility];  Service: Cardiovascular;  Laterality: N/A;   IR FLUORO GUIDE CV LINE RIGHT  10/11/2019   IR REMOVAL TUN CV CATH W/O FL  07/17/2022   IR US GUIDE VASC ACCESS RIGHT  10/11/2019   TEE WITHOUT CARDIOVERSION N/A 07/18/2022   Procedure: TRANSESOPHAGEAL ECHOCARDIOGRAM (TEE);   Surgeon: Janina Mayo, MD;  Location: [facility];  Service: Cardiovascular;  Laterality: N/A;   THORACOTOMY Left 11/08/2013   Procedure: LEFT THORACOTOMY;  Surgeon: Ivin Poot, MD;  Location: [facility];  Service: Thoracic;  Laterality: Left;   Allergies  Allergen Reactions   Acyclovir And Related Hives   Imdur [Isosorbide Nitrate]     Headaches from nitrate   Current Facility-Administered Medications  Medication Dose Route Frequency Provider Last Rate Last Admin   acetaminophen (TYLENOL) tablet 1,000 mg  1,000 mg Oral Q6H Nooruddin, Saad, MD   1,000 mg at 07/21/22 0614   alum & mag hydroxide-simeth (MAALOX/MYLANTA) 200-200-20 MG/5ML suspension 15 mL  15 mL Oral Q6H PRN Linus Galas, MD   15 mL at 07/19/22 1204   amLODipine (NORVASC) tablet 10 mg  10 mg Oral Daily Farrel Gordon, DO   10 mg at 07/20/22 2351   ampicillin (OMNIPEN) 2 g in sodium chloride 0.9 % 100 mL IVPB  2 g Intravenous Q12H Nooruddin, Saad, MD 300 mL/hr at 07/21/22 0211 2 g at 07/21/22 0211   carvedilol (COREG) tablet 37.5 mg  37.5 mg Oral BID WC Katsadouros, Vasilios, MD   37.5 mg at 07/20/22 1650   cefTRIAXone (ROCEPHIN) 2  g in sodium chloride 0.9 % 100 mL IVPB  2 g Intravenous Q12H Einar Grad, RPH 200 mL/hr at 07/21/22 1102 2 g at 07/21/22 1102   Chlorhexidine Gluconate Cloth 2 % PADS 6 each  6 each Topical Q0600 Janalee Dane, PA-C   6 each at 07/21/22 8469   Chlorhexidine Gluconate Cloth 2 % PADS 6 each  6 each Topical Q0600 Roney Jaffe, MD   6 each at 07/21/22 [phone]   cloNIDine (CATAPRES) tablet 0.3 mg  0.3 mg Oral BID Katsadouros, Vasilios, MD   0.3 mg at 07/21/22 1053   heparin injection 5,000 Units  5,000 Units Subcutaneous Q8H Katsadouros, Vasilios, MD   5,000 Units at 07/21/22 2841   hydrALAZINE (APRESOLINE) tablet 100 mg  100 mg Oral TID Farrel Gordon, DO   100 mg at 07/20/22 2351   insulin aspart (novoLOG) injection 12 Units  12 Units Subcutaneous TID WC Katsadouros, Vasilios, MD   12  Units at 07/20/22 1649   insulin glargine-yfgn (SEMGLEE) injection 25 Units  25 Units Subcutaneous QHS Katsadouros, Vasilios, MD   25 Units at 07/20/22 2146   lidocaine (LIDODERM) 5 % 1 patch  1 patch Transdermal Q24H Linus Galas, MD   1 patch at 07/20/22 1500   methocarbamol (ROBAXIN) tablet 500 mg  500 mg Oral TID Linus Galas, MD   500 mg at 07/21/22 1054   Muscle Rub CREA   Topical PRN Riesa Pope, MD   1 Application at 32/44/01 0806   ondansetron (ZOFRAN) injection 4 mg  4 mg Intravenous Q8H PRN Linus Galas, MD   4 mg at 07/18/22 2251   pantoprazole (PROTONIX) EC tablet 20 mg  20 mg Oral Daily Linus Galas, MD   20 mg at 07/21/22 1053   polyethylene glycol (MIRALAX / GLYCOLAX) packet 17 g  17 g Oral Daily Angelica Pou, MD   17 g at 07/20/22 0945   senna-docusate (Senokot-S) tablet 1 tablet  1 tablet Oral BID Angelica Pou, MD   1 tablet at 07/20/22 2138   sevelamer carbonate (RENVELA) tablet 1,600 mg  1,600 mg Oral TID WC Katsadouros, Vasilios, MD   1,600 mg at 07/20/22 1649   sodium chloride flush (NS) 0.9 % injection 10-40 mL  10-40 mL Intracatheter Q12H Charise Killian, MD   10 mL at 07/21/22 1055   sodium chloride flush (NS) 0.9 % injection 10-40 mL  10-40 mL Intracatheter PRN Charise Killian, MD        LABS: Lab Results  Component Value Date   WBC 12.9 (H) 07/21/2022   HGB 8.8 (L) 07/21/2022   HCT 28.1 (L) 07/21/2022   MCV 84.6 07/21/2022   PLT 340 07/21/2022      Component Value Date/Time   NA 136 07/21/2022 0331   NA 141 10/15/2020 1015   K 4.3 07/21/2022 0331   CL 101 07/21/2022 0331   CO2 19 (L) 07/21/2022 0331   GLUCOSE 103 (H) 07/21/2022 0331   BUN 67 (H) 07/21/2022 0331   BUN 62 (H) 10/15/2020 1015   CREATININE 11.98 (H) 07/21/2022 0331   CALCIUM 8.4 (L) 07/21/2022 0331   GFRNONAA 5 (L) 07/21/2022 0331   GFRAA 9 (L) 10/15/2020 1015   Lab Results  Component Value Date   INR 1.2 07/15/2022   INR 1.01  06/02/2016   INR 1.01 11/08/2013   No results found for: "PTT"  Social History   Socioeconomic History   Marital status: Single    Spouse name: Not on  file   Number of children: Not on file   Years of education: Not on file   Highest education level: Some college, no degree  Occupational History   Not on file  Tobacco Use   Smoking status: Never   Smokeless tobacco: Never  Vaping Use   Vaping Use: Never used  Substance and Sexual Activity   Alcohol use: No    Alcohol/week: 0.0 standard drinks of alcohol   Drug use: Yes    Types: Marijuana    Comment: last used 09/15/2020   Sexual activity: Yes    Partners: Female  Other Topics Concern   Not on file  Social History Narrative   Lives in [place] by himself.  Currently in [facility].   Caffeine- none       Social Determinants of Health   Financial Resource Strain: Not on file  Food Insecurity: No Food Insecurity (07/16/2022)   Hunger Vital Sign    Worried About Running Out of Food in the Last Year: Never true    Ran Out of Food in the Last Year: Never true  Transportation Needs: No Transportation Needs (07/16/2022)   PRAPARE - Hydrologist (Medical): No    Lack of Transportation (Non-Medical): No  Physical Activity: Insufficiently Active (11/27/2021)   Exercise Vital Sign    Days of Exercise per Week: 2 days    Minutes of Exercise per Session: 30 min  Stress: Stress Concern Present (11/27/2021)   [place]    Feeling of Stress : To some extent  Social Connections: Not on file  Intimate Partner Violence: Not At Risk (07/16/2022)   Humiliation, Afraid, Rape, and Kick questionnaire    Fear of Current or Ex-Partner: No    Emotionally Abused: No    Physically Abused: No    Sexually Abused: No   Family History  Problem Relation Age of Onset   Diabetes Mellitus II Mother        died @ 30.   Gastric cancer Mother    CAD Father         died @ 45.   Heart attack Father    Congestive Heart Failure Father    Diabetes Mellitus II Sister    CAD Sister        s/p PCI - age 40.     REVIEW OF SYSTEMS:  Reviewed with the patient as per HPI. PSYCH:  [+] Dental phobia     VITAL SIGNS: BP (!) 141/76 (BP Location: Left Arm)   Pulse 88   Temp 97.9 F (36.6 C) (Oral)   Resp 20   Ht 6\' 5"  (1.956 m)   Wt 125 kg   SpO2 100%   BMI 32.68 kg/m      PHYSICAL EXAM: GENERAL:  Well-developed, comfortable and in no apparent distress. NEUROLOGICAL:  Alert and oriented to person, place and  time. EXTRAORAL:  Facial symmetry present without any edema or erythema.  No swelling or lymphadenopathy.  TMJ asymptomatic without clicks or crepitations. INTRAORAL:  Soft tissues appear well-perfused and mucous membranes moist.  FOM and vestibules soft and not raised. Oral cavity without mass or lesion. No signs of infection, parulis, sinus tract, edema or erythema evident upon exam.     DENTAL EXAM: Clinical findings charted.    OVERALL IMPRESSION:  Poor remaining dentition.       ORAL HYGIENE:  Poor  PERIODONTAL:  Pink, healthy gingival tissue with blunted  papilla with areas of inflamed and erythematous gingival tissue.   Generalized plaque and calculus accumulation. [+] Mobility:  #18 and #31 Class 2 mobility CARIES:  #1, #2, #16, #17, #18 and #31 OTHER FINDINGS:   [+]  #17 and #32 drifting mesial   RADIOGRAPHIC EXAM:   07/18/2022 Orthopantogram reviewed and interpreted. Condyles seated bilaterally in fossas.  No evidence of abnormal pathology.  All visualized osseous structures appear WNL. Periapical radiolucencies surrounding teeth numbers 1, 2, 15, 16, 17, 18, 31 & 32. Severe decay approximating the pulp #1, #18 & #31.  Caries- #2, #16, #17 & #31.  #17 & #32 are drifting mesial.   ASSESSMENT:  1.  Severe aortic insufficiency, AV endocarditis 2.  Preoperative dental exam 3.  Caries 4.  Chronic apical periodontitis 5.   Accretions on teeth 6.  Loose teeth 7.  Periodontal disease 9.  Dental phobia 10.  Postoperative bleeding risk   PLAN AND RECOMMENDATIONS: I discussed the risks, benefits, and complications of various scenarios with the patient in relationship to their medical and dental conditions, which included systemic infection such as endocarditis, bacteremia or other serious issues that could potentially occur either before, during or after their anticipated heart surgery if dental/oral concerns are not addressed.  I explained that if any chronic or acute dental/oral infection(s) are addressed and subsequently not maintained following medical optimization and recovery, their risk of the previously mentioned complications are just as high and could potentially occur postoperatively.  I explained all significant findings of the dental consultation with the patient including multiple molars (#1, #2, #15, #16, #17, #18, #31 and #32) with poor or hopeless prognosis with chronic infection due to periodontal disease and dental caries, and the recommended care including extractions of teeth numbers 1, 2, 15, 16, 17, 18, 31 and 32 in order to optimize them for heart surgery from a dental standpoint.  The patient verbalized understanding of all findings, discussion, and recommendations. We then discussed various treatment options to include no treatment, multiple extractions with alveoloplasty, pre-prosthetic surgery as indicated, periodontal therapy, dental restorations, root canal therapy, crown and bridge therapy, implant therapy, and replacement of missing teeth as indicated.  The patient verbalized understanding of all options, and currently wishes to proceed with extractions of all recommended/indicated teeth in the operating room under general anesthesia. Discussed with the patient that since he will be discharged, we will contact him to schedule surgery date prior to his cardiac surgery.  He verbalized understanding.     Plan to discuss all findings and recommendations with medical team and coordinate future care as needed.  Tentative O.R. date for November 30th (Thursday) to complete all indicated dental treatment. The patient will need to establish care at a dental office of his choice for routine dental care including replacement of missing teeth as needed, cleanings and exams.  All questions and concerns were invited and addressed.  The patient tolerated today's visit well.   Sandi Mariscal, DMD

## 2022-07-21 NOTE — Progress Notes (Signed)
Patient refused CPAP for the night  

## 2022-07-21 NOTE — Procedures (Signed)
Vascular and Interventional Radiology Procedure Note  Patient: Jeremy [name] DOB: 01-11-1981 Medical Record Number: [id number] Note Date/Time: 07/21/22 2:56 PM   Performing Physician: Michaelle Birks, MD Assistant(s): None  Diagnosis: Poor IV access, ESRD requiring Hemodialysis, and Long term IV Abx  Procedure:  TUNNELED CENTRAL VENOUS "Powerline" CATHETER PLACEMENT TUNNELED HEMODIALYSIS CATHETER PLACEMENT  Anesthesia: Conscious Sedation Complications: None Estimated Blood Loss: Minimal Specimens:  None  Findings:  Successful placement of ipsilateral left-sided, 30 cm 70F dual lumen Powerline and   28 cm (tip-to-cuff), tunneled hemodialysis catheters with the tips of the catheters in the proximal right atrium.  Plan: Catheters are ready for use.  See detailed procedure note with images in PACS. The patient tolerated the procedure well without incident or complication and was returned to Floor Bed in stable condition.    Michaelle Birks, MD [facility] Rad[facility]   Pager. [place]ara Visa

## 2022-07-21 NOTE — Progress Notes (Signed)
PT Cancellation Note  Patient Details Name: Peighton Edgin MRN: [id number] DOB: [birth year]   Cancelled Treatment:    Reason Eval/Treat Not Completed: Patient at procedure or test/unavailable  Patient off the floor for procedure. Will attempt to see later today as feasible.   [place]  Office [phone]  Rexanne Mano 07/21/2022, 1:39 PM

## 2022-07-21 NOTE — Procedures (Signed)
HD Note:  Some information was entered later than the data was gathered due to patient care needs. The stated time with the data is accurate.  Received patient in bed to unit.  Alert and oriented.  Informed consent signed and in chart.   Patient tolerated well.    Transported back to the room  Alert, without acute distress.  Hand-off given to patient's nurse.   Access used: HD Catheter Access issues: The lines had to be reversed and even with that the arterial line had higher pressures.  Total UF removed: 3300, patient began to have cramping in his hand in the last few minutes of treatment.    [facility]

## 2022-07-21 NOTE — Progress Notes (Addendum)
                 Interval history No events overnight. Feels well without fevers or night sweats.  Very eager to go home.  Physical exam Blood pressure (!) 135/52, pulse 82, temperature 98.4 F (36.9 C), temperature source Axillary, resp. rate 20, height 6\' 5"  (1.956 m), weight 125 kg, SpO2 100 %.  General: Well-appearing.  No apparent acute distress. Cardiovascular: Regular rate and rhythm.  Right radial pulse normal. Pulmonary: Breathing is regular and unlabored. Abdominal: Nondistended. Extremities: No pedal edema. Neurologic: No gross focal deficits. Psychiatric: Mood is irritated.  Affect is concordant.  Weight change: -0.9 kg   Intake/Output Summary (Last 24 hours) at 07/21/2022 0730 Last data filed at 07/21/2022 0453 Gross per 24 hour  Intake --  Output 830 ml  Net -830 ml    Labs WBC: 12.9 Electrolytes stable Fungus culture, blood: No growth at 3 days Blood cultures x2: No growth at 4 days  Assessment and plan Hospital day 6  Jeremy [name] is a 41 y.o. with a history of ESRD on dialysis via tunneled HD catheter, admitted for E faecalis bacteremia, found to have aortic valve vegetation and aortic insufficiency concerning for endocarditis, being treated with IV ampicillin and ceftriaxone.  Endocarditis E faecalis bacteremia  Central line associated bloodstream infection Culprit line removed.  No fevers since starting IV antibiotics.  Blood bacterial and fungal growth culture showing no growth at 4 and 3 days respectively.  Plan to replace central line today for resumption of dialysis and administration of antibiotics.  Infectious diseases following. - Ceftriaxone 2 g IV twice daily - Ampicillin 2 g IV twice daily  Severe aortic insufficiency Observed on TEE.  Minimal symptoms.  Patient is hemodynamically stable without evidence of low output or congestive heart failure.  Work-up for aortic valve replacement is currently underway.  Pending a coronary CTA and  dental evaluation, in addition to at least 2 weeks of IV antibiotics prior to replacement.  ESRD Membranous nephropathy Thought to be due to poorly controlled diabetes.  Plan is to undergo HD catheter placement today.  Fluid status and electrolytes are stable.  We will resume dialysis once line is in place.  Hypertension Nonischemic cardiomyopathy Cardiomyopathy likely secondary to longstanding poorly controlled hypertension and diabetes.  Euvolemic on exam. Ejection fraction is preserved on last echo. - Coreg 37.5 mg twice daily - Clonidine 0.3 mg twice daily - Hydralazine 100 mg 3 times daily - Amlodipine 10 mg daily  Type 2 diabetes mellitus Well-controlled in hospital. - Semglee 25 units nightly - NovoLog 12 units 3 times daily with meals  Back pain Probably musculoskeletal due to degenerative changes rather than early osteomyelitis or discitis.  Diet: NPO for line placement VTE: Heparin Code: Full PT/OT recommendations: Outpatient PT  Discharge plan: home with IV antibiotics pending line placement and coronary CTA.   Nani Gasser MD 07/21/2022, 7:30 AM  Pager: [phone] After 5pm on weekdays and 1pm on weekends: [phone]

## 2022-07-21 NOTE — Progress Notes (Addendum)
[facility] Infectious Diseases Follow Up Note  Patient Identification: Patient Name: Jeremy [name] MRN: [id number] [name] Date: 07/15/2022 12:34 PM Age: 41 y.o.Today's Date: 07/21/2022  Reason for Visit: endocarditis, probable early discitis and OM   Principal Problem:   Fever Active Problems:   Primary hypertension   Enterococcal bacteremia   Catheter-related bloodstream infection   Enterococcus faecalis infection   Acute bacterial endocarditis   Nonrheumatic aortic valve insufficiency   Acute midline low back pain with right-sided sciatica   ESRD (end stage renal disease) (HCC)   Aortic valve endocarditis   Current Antibiotics:  Ampicillin 11/7-c Ceftriaxone 11/8-c  Lines/Hardwares: AVF +  Interval Events: remains afebrile   Assessment 41 year old male with PMH as below including ICM/CHF, 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 Found to have   # E faecalis bacteremia/Native AV endocarditis Repeat blood cx 11/9 NG in 4 days  TEE 11/10 Borderline Severe AI. Concern for aortic valve leaflet perforation 2/2 IE. SVC thrombus  CTVS - plan for AVR once pre op work up completed with dental eval, CT corinoary angiogram and s/p 2 weeks of abtx  # ESRD on HD # Lumbar degenerative disc disease with also concerns for early discitis and osteomyelitis in MRI   Recommendations Continue IV ampicillin and ceftriaxone as is  Fu repeat blood cx 11/9 for clearance  Agree with 2 weeks course of IV abtx to sterilize valves prior to valve surgery. OK to place HD catheter from ID standpoint No definite need for central line for long term abtx as can do Vancomycin/gentamicin via HD in the OP setting to avoid additional line placement. However, since CVC is already placed, defer to Dr Gale Journey in terms of final abtx recs (  Vancomycin/gentamicin through HD vs amp/ceftriaxone via CVC) Fu Dental consult and CT coronary angiogram  Monitor CBC and BMP  D/w IMTS resident  Dr Gale Journey to resume care starting tomorrow.   Rest of the management as per the primary team. Thank you for the consult. Please page with pertinent questions or concerns.  ______________________________________________________________________ Subjective patient seen and examined at the bedside. Back pain is improving.   Vitals BP (!) 140/67 (BP Location: Left Arm)   Pulse 75   Temp 98.5 F (36.9 C) (Oral)   Resp 20   Ht 6\' 5"  (1.956 m)   Wt 125 kg   SpO2 99%   BMI 32.68 kg/m     Physical Exam Constitutional:  Black male, lying in the bed, appears comfortable     Comments:   Cardiovascular:     Rate and Rhythm: Normal rate and regular rhythm.     Heart sounds:   Pulmonary:     Effort: Pulmonary effort is normal.     Comments:   Abdominal:     Palpations: Abdomen is soft.     Tenderness: non distended   Musculoskeletal:        General: No swelling or tenderness.   Skin:    Comments: No obvious rashes   Neurological:     General: No focal deficit present. Awake, alert and oriented   Psychiatric:        Mood and Affect: Mood normal.   Pertinent Microbiology Results for orders placed or performed during the hospital encounter of 07/15/22  Culture, blood (Routine x 2)     Status: Abnormal   Collection Time: 07/15/22  1:06 PM  Specimen: BLOOD  Result Value Ref Range Status   Specimen Description BLOOD LEFT ANTECUBITAL  Final   Special Requests   Final    BOTTLES DRAWN AEROBIC AND ANAEROBIC Blood Culture results may not be optimal due to an inadequate volume of blood received in culture bottles   Culture  Setup Time   Final    GRAM POSITIVE COCCI IN PAIRS IN CHAINS IN BOTH AEROBIC AND ANAEROBIC BOTTLES CRITICAL RESULT CALLED TO, READ BACK BY AND VERIFIED WITH: [name] RN 07/16/22 @ 0420 BY AB Performed at [facility], [place] [address] [place], [place] [address]    Culture ENTEROCOCCUS FAECALIS (A)  Final   Report Status 07/18/2022 FINAL  Final   Organism ID, Bacteria ENTEROCOCCUS FAECALIS  Final      Susceptibility   Enterococcus faecalis - MIC*    AMPICILLIN <=2 SENSITIVE Sensitive     VANCOMYCIN 1 SENSITIVE Sensitive     GENTAMICIN SYNERGY SENSITIVE Sensitive     * ENTEROCOCCUS FAECALIS  Blood Culture ID Panel (Reflexed)     Status: Abnormal   Collection Time: 07/15/22  1:06 PM  Result Value Ref Range Status   Enterococcus faecalis DETECTED (A) NOT DETECTED Final    Comment: CRITICAL RESULT CALLED TO, READ BACK BY AND VERIFIED WITH: K. MUNNETT RN 07/16/22 @ 0420 BY AB    Enterococcus Faecium NOT DETECTED NOT DETECTED Final   Listeria monocytogenes NOT DETECTED NOT DETECTED Final   Staphylococcus species NOT DETECTED NOT DETECTED Final   Staphylococcus aureus (BCID) NOT DETECTED NOT DETECTED Final   Staphylococcus epidermidis NOT DETECTED NOT DETECTED Final   Staphylococcus lugdunensis NOT DETECTED NOT DETECTED Final   Streptococcus species NOT DETECTED NOT DETECTED Final   Streptococcus agalactiae NOT DETECTED NOT DETECTED Final   Streptococcus pneumoniae NOT DETECTED NOT DETECTED Final   Streptococcus pyogenes NOT DETECTED NOT DETECTED Final   A.calcoaceticus-baumannii NOT DETECTED NOT DETECTED Final   Bacteroides fragilis NOT DETECTED NOT DETECTED Final   Enterobacterales NOT DETECTED NOT DETECTED Final   Enterobacter cloacae complex NOT DETECTED NOT DETECTED Final   Escherichia coli NOT DETECTED NOT DETECTED Final   Klebsiella aerogenes NOT DETECTED NOT DETECTED Final   Klebsiella oxytoca NOT DETECTED NOT DETECTED Final   Klebsiella pneumoniae NOT DETECTED NOT DETECTED Final   Proteus species NOT DETECTED NOT DETECTED Final   Salmonella species NOT DETECTED NOT DETECTED Final   Serratia marcescens NOT DETECTED NOT DETECTED Final   Haemophilus influenzae NOT DETECTED NOT  DETECTED Final   Neisseria meningitidis NOT DETECTED NOT DETECTED Final   Pseudomonas aeruginosa NOT DETECTED NOT DETECTED Final   Stenotrophomonas maltophilia NOT DETECTED NOT DETECTED Final   Candida albicans NOT DETECTED NOT DETECTED Final   Candida auris NOT DETECTED NOT DETECTED Final   Candida glabrata NOT DETECTED NOT DETECTED Final   Candida krusei NOT DETECTED NOT DETECTED Final   Candida parapsilosis NOT DETECTED NOT DETECTED Final   Candida tropicalis NOT DETECTED NOT DETECTED Final   Cryptococcus neoformans/gattii NOT DETECTED NOT DETECTED Final   Vancomycin resistance NOT DETECTED NOT DETECTED Final    Comment: Performed at [facility], [address] [place], [place] [address]  Culture, blood (Routine x 2)     Status: Abnormal   Collection Time: 07/15/22  3:00 PM   Specimen: BLOOD  Result Value Ref Range Status   Specimen Description BLOOD RIGHT ANTECUBITAL  Final   Special Requests   Final    BOTTLES DRAWN AEROBIC  AND ANAEROBIC Blood Culture adequate volume   Culture  Setup Time   Final    GRAM POSITIVE COCCI IN PAIRS IN CHAINS IN BOTH AEROBIC AND ANAEROBIC BOTTLES CRITICAL VALUE NOTED.  VALUE IS CONSISTENT WITH PREVIOUSLY REPORTED AND CALLED VALUE.    Culture (A)  Final    ENTEROCOCCUS FAECALIS SUSCEPTIBILITIES PERFORMED ON PREVIOUS CULTURE WITHIN THE LAST 5 DAYS. Performed at [facility], [place] [address] [place], [place] [address]    Report Status 07/18/2022 FINAL  Final  Resp Panel by RT-PCR (Flu A&B, Covid) Anterior Nasal Swab     Status: None   Collection Time: 07/15/22  3:08 PM   Specimen: Anterior Nasal Swab  Result Value Ref Range Status   SARS Coronavirus 2 by RT PCR NEGATIVE NEGATIVE Final    Comment: (NOTE) SARS-CoV-2 target nucleic acids are NOT DETECTED.  The SARS-CoV-2 RNA is generally detectable in upper respiratory specimens during the acute phase of infection. The lowest concentration of SARS-CoV-2 viral copies this assay can  detect is 138 copies/mL. A negative result does not preclude SARS-Cov-2 infection and should not be used as the sole basis for treatment or other patient management decisions. A negative result may occur with  improper specimen collection/handling, submission of specimen other than nasopharyngeal swab, presence of viral mutation(s) within the areas targeted by this assay, and inadequate number of viral copies(<138 copies/mL). A negative result must be combined with clinical observations, patient history, and epidemiological information. The expected result is Negative.  Fact Sheet for Patients:  EntrepreneurPulse.com.au  Fact Sheet for Healthcare Providers:  IncredibleEmployment.be  This test is no t yet approved or cleared by the [place] FDA and  has been authorized for detection and/or diagnosis of SARS-CoV-2 by FDA under an Emergency Use Authorization (EUA). This EUA will remain  in effect (meaning this test can be used) for the duration of the COVID-19 declaration under Section 564(b)(1) of the Act, 21 U.S.C.section 360bbb-3(b)(1), unless the authorization is terminated  or revoked sooner.       Influenza A by PCR NEGATIVE NEGATIVE Final   Influenza B by PCR NEGATIVE NEGATIVE Final    Comment: (NOTE) The Xpert Xpress SARS-CoV-2/FLU/RSV plus assay is intended as an aid in the diagnosis of influenza from Nasopharyngeal swab specimens and should not be used as a sole basis for treatment. Nasal washings and aspirates are unacceptable for Xpert Xpress SARS-CoV-2/FLU/RSV testing.  Fact Sheet for Patients: EntrepreneurPulse.com.au  Fact Sheet for Healthcare Providers: IncredibleEmployment.be  This test is not yet approved or cleared by the [place] FDA and has been authorized for detection and/or diagnosis of SARS-CoV-2 by FDA under an Emergency Use Authorization (EUA). This EUA will remain in  effect (meaning this test can be used) for the duration of the COVID-19 declaration under Section 564(b)(1) of the Act, 21 U.S.C. section 360bbb-3(b)(1), unless the authorization is terminated or revoked.  Performed at [facility], [place] [address], [place], [place] [address]   MRSA Next Gen by PCR, Nasal     Status: None   Collection Time: 07/16/22  6:12 PM   Specimen: Nasal Mucosa; Nasal Swab  Result Value Ref Range Status   MRSA by PCR Next Gen NOT DETECTED NOT DETECTED Final    Comment: (NOTE) The GeneXpert MRSA Assay (FDA approved for NASAL specimens only), is one component of a comprehensive MRSA colonization surveillance program. It is not intended to diagnose MRSA infection nor to guide or monitor treatment for MRSA infections. Test  performance is not FDA approved in patients less than 60 years old. Performed at [facility], [place] [address], [place], [place] [address]   Culture, blood (Routine X 2) w Reflex to ID Panel     Status: None (Preliminary result)   Collection Time: 07/17/22 11:00 AM   Specimen: BLOOD RIGHT ARM  Result Value Ref Range Status   Specimen Description BLOOD RIGHT ARM  Final   Special Requests   Final    BOTTLES DRAWN AEROBIC AND ANAEROBIC Blood Culture results may not be optimal due to an excessive volume of blood received in culture bottles   Culture   Final    NO GROWTH 3 DAYS Performed at [facility], [place] [address], [place], [place] [address]    Report Status PENDING  Incomplete  Culture, blood (Routine X 2) w Reflex to ID Panel     Status: None (Preliminary result)   Collection Time: 07/17/22 11:19 AM   Specimen: BLOOD RIGHT HAND  Result Value Ref Range Status   Specimen Description BLOOD RIGHT HAND  Final   Special Requests   Final    BOTTLES DRAWN AEROBIC AND ANAEROBIC Blood Culture results may not be optimal due to an excessive volume of blood received in culture bottles   Culture   Final    NO GROWTH 3 DAYS Performed  at [facility], [place] [address], [place], [place] [address]    Report Status PENDING  Incomplete  Fungus culture, blood     Status: None (Preliminary result)   Collection Time: 07/18/22  3:35 AM   Specimen: BLOOD  Result Value Ref Range Status   Specimen Description BLOOD RIGHT ANTECUBITAL  Final   Special Requests   Final    BOTTLES DRAWN AEROBIC AND ANAEROBIC Blood Culture adequate volume   Culture   Final    NO GROWTH 2 DAYS Performed at [facility], [place] [address], [place], [place] [address]    Report Status PENDING  Incomplete  Surgical pcr screen     Status: None   Collection Time: 07/19/22  7:25 AM   Specimen: Nasal Mucosa; Nasal Swab  Result Value Ref Range Status   MRSA, PCR NEGATIVE NEGATIVE Final   Staphylococcus aureus NEGATIVE NEGATIVE Final    Comment: (NOTE) The Xpert SA Assay (FDA approved for NASAL specimens in patients 81 years of age and older), is one component of a comprehensive surveillance program. It is not intended to diagnose infection nor to guide or monitor treatment. Performed at [facility], [place] [address], [place], [place] [address]    Pertinent Lab.    Latest Ref Rng & Units 07/21/2022    3:31 AM 07/20/2022    6:52 AM 07/19/2022    2:47 AM  CBC  WBC 4.0 - 10.5 K/uL 12.9  11.5  12.0   Hemoglobin 13.0 - 17.0 g/dL 8.8  9.0  8.6   Hematocrit 39.0 - 52.0 % 28.1  28.3  27.6   Platelets 150 - 400 K/uL 340  345  314       Latest Ref Rng & Units 07/21/2022    3:31 AM 07/20/2022    6:52 AM 07/19/2022    2:47 AM  CMP  Glucose 70 - 99 mg/dL 103  145  95   BUN 6 - 20 mg/dL 67  63  57   Creatinine 0.61 - 1.24 mg/dL 11.98  11.77  11.09   Sodium 135 - 145  mmol/L 136  138  140   Potassium 3.5 - 5.1 mmol/L 4.3  4.6  4.2   Chloride 98 - 111 mmol/L 101  104  104   CO2 22 - 32 mmol/L 19  20  24    Calcium 8.9 - 10.3 mg/dL 8.4  8.6  8.6   Total Protein 6.5 - 8.1 g/dL   6.7   Total Bilirubin 0.3 - 1.2 mg/dL   0.6   Alkaline Phos 38  - 126 U/L   34   AST 15 - 41 U/L   16   ALT 0 - 44 U/L   18      Pertinent Imaging today Plain films and CT images have been personally visualized and interpreted; radiology reports have been reviewed. Decision making incorporated into the Impression / Recommendations.  VAS US DOPPLER PRE CABG  Result Date: 07/19/2022 PREOPERATIVE VASCULAR EVALUATION Patient Name:  KEYVIN RISON  Date of Exam:   07/19/2022 Medical Rec #: [id number]        Accession #:    [id number] Date of Birth: November 22, 1980        Patient Gender: M Patient Age:   28 years Exam Location:  [facility] Procedure:      VAS US DOPPLER PRE CABG Referring Phys: Collier Salina VANTRIGT --------------------------------------------------------------------------------  Indications:      Pre-AVR. Risk Factors:     Hypertension, hyperlipidemia, Diabetes. Other Factors:    Bacteremia with vegetation, ESRD. Comparison Study: No prior studies. Performing Technologist: Darlin Coco RDMS, RVT  Examination Guidelines: A complete evaluation includes B-mode imaging, spectral Doppler, color Doppler, and power Doppler as needed of all accessible portions of each vessel. Bilateral testing is considered an integral part of a complete examination. Limited examinations for reoccurring indications may be performed as noted.  Right Carotid Findings: +----------+--------+--------+--------+--------+--------+           PSV cm/sEDV cm/sStenosisDescribeComments +----------+--------+--------+--------+--------+--------+ CCA Prox  91      12                               +----------+--------+--------+--------+--------+--------+ CCA Distal67      18                               +----------+--------+--------+--------+--------+--------+ ICA Prox  47      18                               +----------+--------+--------+--------+--------+--------+ ICA Mid   85      33                                +----------+--------+--------+--------+--------+--------+ ICA Distal76      24                               +----------+--------+--------+--------+--------+--------+ ECA       149     14                               +----------+--------+--------+--------+--------+--------+ +----------+--------+-------+----------------+------------+           PSV cm/sEDV cmsDescribe        Arm Pressure +----------+--------+-------+----------------+------------+ Subclavian126  Multiphasic, WNL             +----------+--------+-------+----------------+------------+ +---------+--------+--+--------+--+---------+ VertebralPSV cm/s59EDV cm/s17Antegrade +---------+--------+--+--------+--+---------+ Left Carotid Findings: +----------+--------+--------+--------+--------+--------+           PSV cm/sEDV cm/sStenosisDescribeComments +----------+--------+--------+--------+--------+--------+ CCA Prox  93      14                               +----------+--------+--------+--------+--------+--------+ CCA Distal60      15                               +----------+--------+--------+--------+--------+--------+ ICA Prox  54      20                               +----------+--------+--------+--------+--------+--------+ ICA Mid   72      21                               +----------+--------+--------+--------+--------+--------+ ICA Distal94      27                               +----------+--------+--------+--------+--------+--------+ ECA       115     11                               +----------+--------+--------+--------+--------+--------+  +----------+--------+--------+----------------+------------+ SubclavianPSV cm/sEDV cm/sDescribe        Arm Pressure +----------+--------+--------+----------------+------------+           126             Multiphasic, WNL             +----------+--------+--------+----------------+------------+  +---------+--------+--+--------+--+---------+ VertebralPSV cm/s81EDV cm/s22Antegrade +---------+--------+--+--------+--+---------+  ABI Findings: +--------+------------------+-----+---------+--------+ Right   Rt Pressure (mmHg)IndexWaveform Comment  +--------+------------------+-----+---------+--------+ Brachial                       triphasic         +--------+------------------+-----+---------+--------+ +--------+------------------+-----+---------+-------+ Left    Lt Pressure (mmHg)IndexWaveform Comment +--------+------------------+-----+---------+-------+ Brachial                       triphasic        +--------+------------------+-----+---------+-------+  Right Doppler Findings: +--------+--------+-----+---------+----------------------------------+ Site    PressureIndexDoppler  Comments                           +--------+--------+-----+---------+----------------------------------+ Brachial             triphasic                                   +--------+--------+-----+---------+----------------------------------+ Radial               triphasicDistal stenosis w/ PSV of 503 cm/s +--------+--------+-----+---------+----------------------------------+ Ulnar                triphasic                                   +--------+--------+-----+---------+----------------------------------+  Left Doppler Findings: +--------+--------+-----+---------+--------+ Site  PressureIndexDoppler  Comments +--------+--------+-----+---------+--------+ Brachial             triphasic         +--------+--------+-----+---------+--------+ Radial               triphasic         +--------+--------+-----+---------+--------+ Ulnar                triphasic         +--------+--------+-----+---------+--------+   Summary: Right Carotid: The extracranial vessels were near-normal with only minimal wall                thickening or plaque. Left Carotid: The extracranial  vessels were near-normal with only minimal wall               thickening or plaque. Vertebrals:  Bilateral vertebral arteries demonstrate antegrade flow. Subclavians: Normal flow hemodynamics were seen in bilateral subclavian              arteries. Right Upper Extremity: Doppler waveforms remain within normal limits with right radial compression. Doppler waveform obliterate with right ulnar compression. 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 Left Upper Extremity: Doppler waveforms remain within normal limits with left radial compression. Doppler waveforms remain within normal limits with left ulnar compression.  Electronically signed by Monica Martinez MD on 07/19/2022 at 1:13:36 PM.    Final     I spent 60  minutes for this patient encounter including review of prior medical records, coordination of care with primary/other specialist with greater than 50% of time being face to face/counseling and discussing diagnostics/treatment plan with the patient/family.  Electronically signed by:   [name], MD Infectious Disease Physician [facility] Pager: [phone]

## 2022-07-21 NOTE — Progress Notes (Addendum)
Progress Note  Patient Name: Jeremy [name] Date of Encounter: 07/21/2022  Primary Cardiologist: Loralie Champagne, MD  Subjective   No acute complaints, feeling better. He is frustrated with having to stay in the hospital and asks if any of this can be done outpatient because he wants to be discharged as he has things to do.  Inpatient Medications    Scheduled Meds:  acetaminophen  1,000 mg Oral Q6H   amLODipine  10 mg Oral Daily   carvedilol  37.5 mg Oral BID WC   Chlorhexidine Gluconate Cloth  6 each Topical Q0600   Chlorhexidine Gluconate Cloth  6 each Topical Q0600   cloNIDine  0.3 mg Oral BID   heparin  5,000 Units Subcutaneous Q8H   hydrALAZINE  100 mg Oral TID   insulin aspart  12 Units Subcutaneous TID WC   insulin glargine-yfgn  25 Units Subcutaneous QHS   lidocaine  1 patch Transdermal Q24H   methocarbamol  500 mg Oral TID   pantoprazole  20 mg Oral Daily   polyethylene glycol  17 g Oral Daily   senna-docusate  1 tablet Oral BID   sevelamer carbonate  1,600 mg Oral TID WC   sodium chloride flush  10-40 mL Intracatheter Q12H   Continuous Infusions:  ampicillin (OMNIPEN) IV 2 g (07/21/22 0211)   cefTRIAXone (ROCEPHIN)  IV 2 g (07/21/22 1102)   PRN Meds: alum & mag hydroxide-simeth, Muscle Rub, ondansetron (ZOFRAN) IV, sodium chloride flush   Vital Signs    Vitals:   07/20/22 2348 07/21/22 0400 07/21/22 0500 07/21/22 0821  BP: (!) 143/86 (!) 135/52  (!) 140/67  Pulse: (!) 105 82  75  Resp: 20 20  20   Temp: 98.7 F (37.1 C) 98.4 F (36.9 C)  98.5 F (36.9 C)  TempSrc: Oral Axillary  Oral  SpO2: 98% 100%  99%  Weight:   125 kg   Height:        Intake/Output Summary (Last 24 hours) at 07/21/2022 1103 Last data filed at 07/21/2022 0453 Gross per 24 hour  Intake --  Output 830 ml  Net -830 ml      07/21/2022    5:00 AM 07/20/2022    4:40 AM 07/19/2022    5:00 AM  Last 3 Weights  Weight (lbs) 275 lb 9.2 oz 277 lb 9 oz 276 lb 7.3 oz  Weight (kg)  125 kg 125.9 kg 125.4 kg     Telemetry    N/A - Personally Reviewed  ECG    No new tracings. Sinus tach on intake. - Personally Reviewed  Physical Exam   GEN: No acute distress.  HEENT: Normocephalic, atraumatic, sclera non-icteric. Neck: No JVD or bruits. Cardiac: RRR 2/6 systolic/diastolic murmur, no rubs or gallops.  Respiratory: Clear to auscultation bilaterally. Breathing is unlabored. GI: Soft, nontender, non-distended, BS +x 4. MS: no deformity. Extremities: No clubbing or cyanosis. No edema. Distal pedal pulses are 2+ and equal bilaterally. Neuro:  AAOx3. Follows commands. Psych:  Responds to questions appropriately with a normal affect.  Labs    High Sensitivity Troponin:   Recent Labs  Lab 07/15/22 1228 07/15/22 1315  TROPONINIHS 256* 213*      Cardiac EnzymesNo results for input(s): "TROPONINI" in the last 168 hours. No results for input(s): "TROPIPOC" in the last 168 hours.   Chemistry Recent Labs  Lab 07/15/22 1315 07/16/22 0139 07/17/22 5465 07/18/22 0335 07/19/22 0247 07/20/22 0652 07/21/22 0331  NA 138   < > 134* 134* 140  138 136  K 4.5   < > 3.4* 3.9 4.2 4.6 4.3  CL 103   < > 99 98 104 104 101  CO2 19*   < > 23 22 24  20* 19*  GLUCOSE 240*   < > 144* 160* 95 145* 103*  BUN 60*   < > 42* 52* 57* 63* 67*  CREATININE 9.10*   < > 7.41* 9.76* 11.09* 11.77* 11.98*  CALCIUM 9.1   < > 8.6* 8.1* 8.6* 8.6* 8.4*  PROT 7.7  --   --   --  6.7  --   --   ALBUMIN 2.8*   < > 2.5* 2.2* 2.3*  2.3*  --   --   AST 25  --   --   --  16  --   --   ALT 26  --   --   --  18  --   --   ALKPHOS 55  --   --   --  34*  --   --   BILITOT 0.8  --   --   --  0.6  --   --   GFRNONAA 7*   < > 9* 6* 5* 5* 5*  ANIONGAP 16*   < > 12 14 12 14  16*   < > = values in this interval not displayed.     Hematology Recent Labs  Lab 07/19/22 0247 07/20/22 0652 07/21/22 0331  WBC 12.0* 11.5* 12.9*  RBC 3.19* 3.29* 3.32*  HGB 8.6* 9.0* 8.8*  HCT 27.6* 28.3* 28.1*  MCV 86.5  86.0 84.6  MCH 27.0 27.4 26.5  MCHC 31.2 31.8 31.3  RDW 15.9* 15.9* 16.1*  PLT 314 345 340    BNPNo results for input(s): "BNP", "PROBNP" in the last 168 hours.   DDimer No results for input(s): "DDIMER" in the last 168 hours.   Radiology    VAS US DOPPLER PRE CABG  Result Date: 07/19/2022 PREOPERATIVE VASCULAR EVALUATION Patient Name:  Jeremy [name]  Date of Exam:   07/19/2022 Medical Rec #: [id number]        Accession #:    [id number] Date of Birth: 1980-09-17        Patient Gender: M Patient Age:   41 years Exam Location:  [facility] Procedure:      VAS US DOPPLER PRE CABG Referring Phys: Collier Salina VANTRIGT --------------------------------------------------------------------------------  Indications:      Pre-AVR. Risk Factors:     Hypertension, hyperlipidemia, Diabetes. Other Factors:    Bacteremia with vegetation, ESRD. Comparison Study: No prior studies. Performing Technologist: Darlin Coco RDMS, RVT  Examination Guidelines: A complete evaluation includes B-mode imaging, spectral Doppler, color Doppler, and power Doppler as needed of all accessible portions of each vessel. Bilateral testing is considered an integral part of a complete examination. Limited examinations for reoccurring indications may be performed as noted.  Right Carotid Findings: +----------+--------+--------+--------+--------+--------+           PSV cm/sEDV cm/sStenosisDescribeComments +----------+--------+--------+--------+--------+--------+ CCA Prox  91      12                               +----------+--------+--------+--------+--------+--------+ CCA Distal67      18                               +----------+--------+--------+--------+--------+--------+ ICA Prox  47  18                               +----------+--------+--------+--------+--------+--------+ ICA Mid   85      33                               +----------+--------+--------+--------+--------+--------+ ICA Distal76       24                               +----------+--------+--------+--------+--------+--------+ ECA       149     14                               +----------+--------+--------+--------+--------+--------+ +----------+--------+-------+----------------+------------+           PSV cm/sEDV cmsDescribe        Arm Pressure +----------+--------+-------+----------------+------------+ Subclavian126            Multiphasic, WNL             +----------+--------+-------+----------------+------------+ +---------+--------+--+--------+--+---------+ VertebralPSV cm/s59EDV cm/s17Antegrade +---------+--------+--+--------+--+---------+ Left Carotid Findings: +----------+--------+--------+--------+--------+--------+           PSV cm/sEDV cm/sStenosisDescribeComments +----------+--------+--------+--------+--------+--------+ CCA Prox  93      14                               +----------+--------+--------+--------+--------+--------+ CCA Distal60      15                               +----------+--------+--------+--------+--------+--------+ ICA Prox  54      20                               +----------+--------+--------+--------+--------+--------+ ICA Mid   72      21                               +----------+--------+--------+--------+--------+--------+ ICA Distal94      27                               +----------+--------+--------+--------+--------+--------+ ECA       115     11                               +----------+--------+--------+--------+--------+--------+  +----------+--------+--------+----------------+------------+ SubclavianPSV cm/sEDV cm/sDescribe        Arm Pressure +----------+--------+--------+----------------+------------+           126             Multiphasic, WNL             +----------+--------+--------+----------------+------------+ +---------+--------+--+--------+--+---------+ VertebralPSV cm/s81EDV cm/s22Antegrade  +---------+--------+--+--------+--+---------+  ABI Findings: +--------+------------------+-----+---------+--------+ Right   Rt Pressure (mmHg)IndexWaveform Comment  +--------+------------------+-----+---------+--------+ Brachial                       triphasic         +--------+------------------+-----+---------+--------+ +--------+------------------+-----+---------+-------+ Left    Lt Pressure (mmHg)IndexWaveform Comment +--------+------------------+-----+---------+-------+ Brachial  triphasic        +--------+------------------+-----+---------+-------+  Right Doppler Findings: +--------+--------+-----+---------+----------------------------------+ Site    PressureIndexDoppler  Comments                           +--------+--------+-----+---------+----------------------------------+ Brachial             triphasic                                   +--------+--------+-----+---------+----------------------------------+ Radial               triphasicDistal stenosis w/ PSV of 503 cm/s +--------+--------+-----+---------+----------------------------------+ Ulnar                triphasic                                   +--------+--------+-----+---------+----------------------------------+  Left Doppler Findings: +--------+--------+-----+---------+--------+ Site    PressureIndexDoppler  Comments +--------+--------+-----+---------+--------+ Brachial             triphasic         +--------+--------+-----+---------+--------+ Radial               triphasic         +--------+--------+-----+---------+--------+ Ulnar                triphasic         +--------+--------+-----+---------+--------+   Summary: Right Carotid: The extracranial vessels were near-normal with only minimal wall                thickening or plaque. Left Carotid: The extracranial vessels were near-normal with only minimal wall               thickening or plaque.  Vertebrals:  Bilateral vertebral arteries demonstrate antegrade flow. Subclavians: Normal flow hemodynamics were seen in bilateral subclavian              arteries. Right Upper Extremity: Doppler waveforms remain within normal limits with right radial compression. Doppler waveform obliterate with right ulnar compression. Incidental finding: Distal right radial artery stenosis with peak systolic velocities of 962 cm/s. Left Upper Extremity: Doppler waveforms remain within normal limits with left radial compression. Doppler waveforms remain within normal limits with left ulnar compression.  Electronically signed by Monica Martinez MD on 07/19/2022 at 1:13:36 PM.    Final     Cardiac Studies   Dopplers 07/19/22   Summary:  Right Carotid: The extracranial vessels were near-normal with only minimal  wall                thickening or plaque.   Left Carotid: The extracranial vessels were near-normal with only minimal  wall               thickening or plaque.  Vertebrals:  Bilateral vertebral arteries demonstrate antegrade flow.  Subclavians: Normal flow hemodynamics were seen in bilateral subclavian               arteries.   Right Upper Extremity: Doppler waveforms remain within normal limits with  right radial compression. Doppler waveform obliterate with right ulnar  compression.   Incidental finding: Distal right radial artery stenosis with peak systolic  velocities of 836 cm/s.   Left Upper Extremity: Doppler waveforms remain within normal limits with  left radial compression. Doppler waveforms remain within normal limits  with left ulnar  compression.   Electronically signed by Monica Martinez MD on 07/19/2022 at 1:13:36 PM.   TEE 07/18/22  IMPRESSIONS     1. Left ventricular ejection fraction, by estimation, is 55 to 60%. The  left ventricle has normal function.   2. Right ventricular systolic function is normal. The right ventricular  size is normal.   3. No left  atrial/left atrial appendage thrombus was detected.   4. 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   5. The mitral valve is normal in structure. No evidence of mitral valve  regurgitation.   6. Small defect with non central AI jet concerning for perforation of the  [place] approx. 0.2 cm. Small vegetation seen on the [place] (image 59). aortic  valve is sclerotic with mild calcification. Central AI jet with VC ~0.67  cm and holodiastolic flow reversal   (image 56) was seen in the descending aorta. PHT 264 ms. Concerning for  borderline severe AI.   7. No aortic root abscess.   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   2D echo 07/16/22   1. Left ventricular ejection fraction, by estimation, is 50 to 55%. The  left ventricle has low normal function. The left ventricle has no regional  wall motion abnormalities. There is mild concentric left ventricular  hypertrophy. Left ventricular  diastolic parameters are consistent with Grade II diastolic dysfunction  (pseudonormalization).   2. Right ventricular systolic function is normal. The right ventricular  size is normal. Tricuspid regurgitation signal is inadequate for assessing  PA pressure.   3. The mitral valve is normal in structure. Trivial mitral valve  regurgitation. No evidence of mitral stenosis.   4. Dialysis catheter tip in right atrium.   5. The aortic valve is tricuspid. Aortic valve regurgitation is moderate.  No aortic stenosis is present. Cannot fully rule out small vegetation on  aortic valve. Suggest TEE to assess the aortic valve.   6. Aortic dilatation noted. 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   7. The inferior vena cava is dilated in size with >50% respiratory  variability, suggesting right atrial pressure of 8 mmHg.    Patient Profile     41 y.o. male with ESRD on HD, HTN, HLD, obesity, NICM with recovered  EF (EF 20-25% in 2016 with normal cors at that time, improved to normal), DM, seizures, asthma, anemia who presented to the hospital with fever and infectious symptoms with chills, SOB, chest pain, nausea, and vomiting, found to have Enterococcus faecalis bacteremia. TEE + for IE of AV.  Assessment & Plan    1. 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 I reviewed with cardmaster who will assist with ensuring dental team has been notified of this request - will order telemetry to follow for any heart block - cor CTA planned for today, reviewed with nurse navigator team to ensure this is completed, timing TBD based on other procedures today - Dr. Gasper Sells to follow result - patient inquiring whether discharge is feasible? Await MD input  2. H/o NICM, chronic combined CHF - recovered EF - troponins low/flat, suspect demand process -> cor CT eval planned as above - volume managed per HD  3. ERSD on HD - per nephrology - per IR, tunneled central catheter placement planned today  4. Essential HTN - current rx - amlodipine 10mg  daily, carvedilol 37.5mg  BID, hydralazine 100mg  TID, SBP 257D-051G systolic - follow  Remainder of issues per IM  For questions or updates, please contact [facility] Please consult [email or web address] for contact info under Cardiology/STEMI.  Signed, Charlie Pitter, PA-C 07/21/2022, 11:03 AM    Personally seen and examined. Agree with APP above with the following comments:  Briefly 41 yo M with AV endocarditis and severe AI planed for 2 week of ABX, dental work, then plans for surgery.  Had ESRD and HFrEF.  Patient notes no symptoms.  He is anxious to leadve. No CP, SOB, or DOE. Heart rates elevated at 74 bpm.  Exam  notable for prominent diastolic heart murmur, rest as above.  Would recommend  Cardiac CT Retro with Nitro for CAD and for AV No evidence of HB on EKG, starting telemetry; eval for abscess Once dental plan in place, if no obstructive CAD TCTS note seems so suggest 2 weeks of ABX then surgery (not necessarily inpatient)  Rudean Haskell, MD [facility]  [address] [place], [place] [address] [phone]  12:44 PM

## 2022-07-22 ENCOUNTER — Inpatient Hospital Stay (HOSPITAL_COMMUNITY): Payer: Medicaid Other

## 2022-07-22 ENCOUNTER — Other Ambulatory Visit (HOSPITAL_COMMUNITY): Payer: Medicaid Other

## 2022-07-22 ENCOUNTER — Encounter: Payer: Medicaid Other | Admitting: Student

## 2022-07-22 DIAGNOSIS — R7881 Bacteremia: Secondary | ICD-10-CM | POA: Diagnosis not present

## 2022-07-22 DIAGNOSIS — B952 Enterococcus as the cause of diseases classified elsewhere: Secondary | ICD-10-CM | POA: Diagnosis not present

## 2022-07-22 DIAGNOSIS — D631 Anemia in chronic kidney disease: Secondary | ICD-10-CM | POA: Diagnosis not present

## 2022-07-22 DIAGNOSIS — I120 Hypertensive chronic kidney disease with stage 5 chronic kidney disease or end stage renal disease: Secondary | ICD-10-CM | POA: Diagnosis not present

## 2022-07-22 DIAGNOSIS — A419 Sepsis, unspecified organism: Secondary | ICD-10-CM

## 2022-07-22 DIAGNOSIS — I351 Nonrheumatic aortic (valve) insufficiency: Secondary | ICD-10-CM

## 2022-07-22 DIAGNOSIS — R509 Fever, unspecified: Secondary | ICD-10-CM | POA: Diagnosis not present

## 2022-07-22 DIAGNOSIS — I350 Nonrheumatic aortic (valve) stenosis: Secondary | ICD-10-CM | POA: Diagnosis not present

## 2022-07-22 DIAGNOSIS — R5081 Fever presenting with conditions classified elsewhere: Secondary | ICD-10-CM | POA: Diagnosis not present

## 2022-07-22 DIAGNOSIS — A498 Other bacterial infections of unspecified site: Secondary | ICD-10-CM | POA: Diagnosis not present

## 2022-07-22 DIAGNOSIS — I339 Acute and subacute endocarditis, unspecified: Secondary | ICD-10-CM | POA: Diagnosis not present

## 2022-07-22 DIAGNOSIS — N250 Renal osteodystrophy: Secondary | ICD-10-CM | POA: Diagnosis not present

## 2022-07-22 DIAGNOSIS — Z992 Dependence on renal dialysis: Secondary | ICD-10-CM | POA: Diagnosis not present

## 2022-07-22 DIAGNOSIS — I330 Acute and subacute infective endocarditis: Secondary | ICD-10-CM | POA: Diagnosis not present

## 2022-07-22 DIAGNOSIS — I5020 Unspecified systolic (congestive) heart failure: Secondary | ICD-10-CM | POA: Diagnosis not present

## 2022-07-22 DIAGNOSIS — N186 End stage renal disease: Secondary | ICD-10-CM | POA: Diagnosis not present

## 2022-07-22 DIAGNOSIS — I358 Other nonrheumatic aortic valve disorders: Secondary | ICD-10-CM | POA: Diagnosis not present

## 2022-07-22 LAB — CULTURE, BLOOD (ROUTINE X 2)
Culture: NO GROWTH
Culture: NO GROWTH

## 2022-07-22 LAB — CBC
HCT: 29.9 % — ABNORMAL LOW (ref 39.0–52.0)
Hemoglobin: 9.2 g/dL — ABNORMAL LOW (ref 13.0–17.0)
MCH: 26.5 pg (ref 26.0–34.0)
MCHC: 30.8 g/dL (ref 30.0–36.0)
MCV: 86.2 fL (ref 80.0–100.0)
Platelets: 370 10*3/uL (ref 150–400)
RBC: 3.47 MIL/uL — ABNORMAL LOW (ref 4.22–5.81)
RDW: 16.2 % — ABNORMAL HIGH (ref 11.5–15.5)
WBC: 10.1 10*3/uL (ref 4.0–10.5)
nRBC: 0 % (ref 0.0–0.2)

## 2022-07-22 LAB — RENAL FUNCTION PANEL
Albumin: 2.5 g/dL — ABNORMAL LOW (ref 3.5–5.0)
Anion gap: 12 (ref 5–15)
BUN: 34 mg/dL — ABNORMAL HIGH (ref 6–20)
CO2: 27 mmol/L (ref 22–32)
Calcium: 8.6 mg/dL — ABNORMAL LOW (ref 8.9–10.3)
Chloride: 99 mmol/L (ref 98–111)
Creatinine, Ser: 7.51 mg/dL — ABNORMAL HIGH (ref 0.61–1.24)
GFR, Estimated: 9 mL/min — ABNORMAL LOW (ref >60)
Glucose, Bld: 170 mg/dL — ABNORMAL HIGH (ref 70–99)
Phosphorus: 5.4 mg/dL — ABNORMAL HIGH (ref 2.5–4.6)
Potassium: 3.9 mmol/L (ref 3.5–5.1)
Sodium: 138 mmol/L (ref 135–145)

## 2022-07-22 LAB — HEPATIC FUNCTION PANEL
ALT: 42 U/L (ref 0–44)
AST: 42 U/L — ABNORMAL HIGH (ref 15–41)
Albumin: 2.4 g/dL — ABNORMAL LOW (ref 3.5–5.0)
Alkaline Phosphatase: 38 U/L (ref 38–126)
Bilirubin, Direct: <0.1 mg/dL (ref 0.0–0.2)
Total Bilirubin: 0.5 mg/dL (ref 0.3–1.2)
Total Protein: 7.6 g/dL (ref 6.5–8.1)

## 2022-07-22 LAB — GLUCOSE, CAPILLARY
Glucose-Capillary: 162 mg/dL — ABNORMAL HIGH (ref 70–99)
Glucose-Capillary: 118 mg/dL — ABNORMAL HIGH (ref 70–99)

## 2022-07-22 MED ORDER — IOHEXOL 350 MG/ML SOLN
95.0000 mL | Freq: Once | INTRAVENOUS | Status: AC | PRN
  Administered 2022-07-22: 95 mL via INTRAVENOUS

## 2022-07-22 MED ORDER — GENTAMICIN SULFATE 40 MG/ML IJ SOLN: 150.0000 mg | INTRAVENOUS | Status: DC

## 2022-07-22 MED ORDER — GENTAMICIN SULFATE 40 MG/ML IJ SOLN: 200.0000 mg | Freq: Once | INTRAVENOUS | Status: DC

## 2022-07-22 MED ORDER — METOPROLOL TARTRATE 50 MG PO TABS
100.0000 mg | ORAL_TABLET | ORAL | Status: AC
  Administered 2022-07-22: 100 mg via ORAL
  Filled 2022-07-22: qty 2

## 2022-07-22 MED ORDER — HEPARIN SODIUM (PORCINE) 1000 UNIT/ML DIALYSIS: 2000.0000 [IU] | INTRAMUSCULAR | Status: DC | PRN

## 2022-07-22 MED ORDER — VANCOMYCIN HCL IN DEXTROSE 1-5 GM/200ML-% IV SOLN
1000.0000 mg | INTRAVENOUS | Status: DC
  Filled 2022-07-22: qty 200

## 2022-07-22 MED ORDER — VANCOMYCIN HCL 10 G IV SOLR
2500.0000 mg | Freq: Once | INTRAVENOUS | Status: AC
  Administered 2022-07-22: 2500 mg via INTRAVENOUS
  Filled 2022-07-22: qty 2500

## 2022-07-22 MED ORDER — NITROGLYCERIN 0.4 MG SL SUBL
SUBLINGUAL_TABLET | SUBLINGUAL | Status: AC
  Administered 2022-07-22: 0.8 mg via SUBLINGUAL
  Filled 2022-07-22: qty 2

## 2022-07-22 MED ORDER — VANCOMYCIN HCL 10 G IV SOLR: 2500.0000 mg | Freq: Once | INTRAVENOUS | Status: DC

## 2022-07-22 MED ORDER — VANCOMYCIN HCL IN DEXTROSE 1-5 GM/200ML-% IV SOLN: 1000.0000 mg | INTRAVENOUS | Status: DC

## 2022-07-22 MED ORDER — NITROGLYCERIN 0.4 MG SL SUBL: 0.8000 mg | SUBLINGUAL_TABLET | Freq: Once | SUBLINGUAL | Status: AC

## 2022-07-22 MED ORDER — GENTAMICIN SULFATE 40 MG/ML IJ SOLN
200.0000 mg | Freq: Once | INTRAVENOUS | Status: DC
  Filled 2022-07-22: qty 5

## 2022-07-22 NOTE — TOC Transition Note (Signed)
Transition of Care [facility]) - CM/SW Discharge Note   Patient Details  Name: Jeremy [name] MRN: [id number] Date of Birth: 05-19-1981  Transition of Care [facility] CM/SW Contact:  Pollie Friar, RN Phone Number: 07/22/2022, 3:17 PM   Clinical Narrative:    Pt discharging home with outpatient therapy. Information on the AVS. Walker for home at the bedside.  Pt will receive IV abx with his HD sessions.  Pt has transportation home.   Final next level of care: OP Rehab Barriers to Discharge: No Barriers Identified   Patient Goals and CMS Choice     Choice offered to / list presented to : Patient  Discharge Placement                       Discharge Plan and Services   Discharge Planning Services: CM Consult Post Acute Care Choice: Durable Medical Equipment          DME Arranged: Walker rolling DME Agency: [employer] Date DME Agency Contacted: 07/18/22   Representative spoke with at DME Agency: [place]            Social Determinants of Health ([place]) Interventions Food Insecurity Interventions: Intervention Not Indicated Housing Interventions: Intervention Not Indicated Utilities Interventions: Intervention Not Indicated   Readmission Risk Interventions     No data to display

## 2022-07-22 NOTE — Progress Notes (Addendum)
Pt for d/c today. Noted pt to need iv vancomycin and gentamicin with HD at d/c. Contacted [facility] and clinic does not have the gentamicin. Clinic will have to order med and unsure when med will be delivered. Update provided to treatment team for direction on situation.   Melven Sartorius Renal Navigator [phone]  Addendum at 3:44 pm: ID MD agreeable to pt treating with iv vanc only until clinic is able to obtain gentamicin next week. Spoke to [place] at clinic to make request that med be ordered and that MD agreeable to vanc only until other abx arrives. Clinic aware pt to d/c today and resume on Thursday. Renal PA to send orders to clinic for iv abx needs at d/c.

## 2022-07-22 NOTE — Progress Notes (Signed)
Progress Note  Patient Name: Jeremy [name] Date of Encounter: 07/22/2022  Primary Cardiologist: Loralie Champagne, MD  Subjective   Got [place].  Did not appear to get his Cardiac CT Patient notes that he is getting his CT, Dental consult, and HD, then leaving.  Inpatient Medications    Scheduled Meds:  acetaminophen  1,000 mg Oral Q6H   amLODipine  10 mg Oral Daily   carvedilol  37.5 mg Oral BID WC   Chlorhexidine Gluconate Cloth  6 each Topical Q0600   Chlorhexidine Gluconate Cloth  6 each Topical Q0600   Chlorhexidine Gluconate Cloth  6 each Topical Q0600   cloNIDine  0.3 mg Oral BID   heparin  5,000 Units Subcutaneous Q8H   hydrALAZINE  100 mg Oral TID   insulin aspart  12 Units Subcutaneous TID WC   insulin glargine-yfgn  25 Units Subcutaneous QHS   lidocaine  1 patch Transdermal Q24H   methocarbamol  500 mg Oral TID   pantoprazole  20 mg Oral Daily   polyethylene glycol  17 g Oral Daily   senna-docusate  1 tablet Oral BID   sevelamer carbonate  1,600 mg Oral TID WC   sodium chloride flush  10-40 mL Intracatheter Q12H   Continuous Infusions:  ampicillin (OMNIPEN) IV 2 g (07/22/22 0215)   cefTRIAXone (ROCEPHIN)  IV 2 g (07/21/22 2148)   PRN Meds: alum & mag hydroxide-simeth, [START ON 07/23/2022] heparin, Muscle Rub, ondansetron (ZOFRAN) IV, sodium chloride flush   Vital Signs    Vitals:   07/21/22 2024 07/21/22 2105 07/21/22 2336 07/22/22 0450  BP: (!) 171/65 (!) 155/68 (!) 120/57 (!) 97/41  Pulse: 90 90 100 72  Resp: (!) _0 Temp: 98.3 F (36.8 C) 98.6 F (37 C) 98.6 F (37 C) 98.3 F (36.8 C)  TempSrc:  Oral Oral Oral  SpO2: 100% 100% 97% 100%  Weight:      Height:        Intake/Output Summary (Last 24 hours) at 07/22/2022 0800 Last data filed at 07/22/2022 0000 Gross per 24 hour  Intake 100 ml  Output 3300 ml  Net -3200 ml      07/21/2022    4:00 PM 07/21/2022    5:00 AM 07/20/2022    4:40 AM  Last 3 Weights  Weight (lbs) 272  lb 0.8 oz 275 lb 9.2 oz 277 lb 9 oz  Weight (kg) 123.4 kg 125 kg 125.9 kg     Telemetry    Not added - Personally Reviewed  ECG    No new tracings. Sinus tach on intake. - Personally Reviewed  Physical Exam   GEN: No acute distress.  HEENT: Normocephalic, atraumatic, sclera non-icteric. Neck: No JVD or bruits. Cardiac: RRR 2/6 systolic/diastolic murmur, no rubs or gallops.  Respiratory: Clear to auscultation bilaterally. Breathing is unlabored. GI: Soft, nontender, non-distended, BS +x 4. MS: no deformity. Extremities: No clubbing or cyanosis. No edema. Distal pedal pulses are 2+ and equal bilaterally. Neuro:  AAOx3. Follows commands. Psych:  Responds to questions appropriately with a normal affect.  Labs    High Sensitivity Troponin:   Recent Labs  Lab 07/15/22 1228 07/15/22 1315  TROPONINIHS 256* 213*      Cardiac EnzymesNo results for input(s): "TROPONINI" in the last 168 hours. No results for input(s): "TROPIPOC" in the last 168 hours.   Chemistry Recent Labs  Lab 07/15/22 1315 07/16/22 0139 07/18/22 0335 07/19/22 0247 07/20/22 1856 07/21/22 0331 07/22/22 0409  NA 138   < >  134* 140 138 136 138  K 4.5   < > 3.9 4.2 4.6 4.3 3.9  CL 103   < > 98 104 104 101 99  CO2 19*   < > 22 24 20* 19* 27  GLUCOSE 240*   < > 160* 95 145* 103* 170*  BUN 60*   < > 52* 57* 63* 67* 34*  CREATININE 9.10*   < > 9.76* 11.09* 11.77* 11.98* 7.51*  CALCIUM 9.1   < > 8.1* 8.6* 8.6* 8.4* 8.6*  PROT 7.7  --   --  6.7  --   --  7.6  ALBUMIN 2.8*   < > 2.2* 2.3*  2.3*  --   --  2.4*  2.5*  AST 25  --   --  16  --   --  42*  ALT 26  --   --  18  --   --  42  ALKPHOS 55  --   --  34*  --   --  38  BILITOT 0.8  --   --  0.6  --   --  0.5  GFRNONAA 7*   < > 6* 5* 5* 5* 9*  ANIONGAP 16*   < > _0 16* 12   < > = values in this interval not displayed.     Hematology Recent Labs  Lab 07/20/22 0652 07/21/22 0331 07/22/22 0409  WBC 11.5* 12.9* 10.1  RBC 3.29* 3.32* 3.47*   HGB 9.0* 8.8* 9.2*  HCT 28.3* 28.1* 29.9*  MCV 86.0 84.6 86.2  MCH 27.4 26.5 26.5  MCHC 31.8 31.3 30.8  RDW 15.9* 16.1* 16.2*  PLT 345 340 370    BNPNo results for input(s): "BNP", "PROBNP" in the last 168 hours.   DDimer No results for input(s): "DDIMER" in the last 168 hours.   Radiology    IR US Guide Vasc Access Left  Result Date: 07/21/2022 INDICATION: ESRD on HD.  Infection requiring long-term IV antibiotics. EXAM: Procedures: 1. ULTRASOUND AND FLUOROSCOPIC GUIDED PLACEMENT OF TUNNELED CENTRAL VENOUS "Powerline" CATHETER 2. TUNNELED CENTRAL VENOUS HEMODIALYSIS CATHETER PLACEMENT WITH ULTRASOUND AND FLUOROSCOPIC GUIDANCE MEDICATIONS: The patient was on scheduled IV antibiotics. ANESTHESIA/SEDATION: Moderate (conscious) sedation was employed during this procedure. A total of Versed 3 mg and Fentanyl 100 mcg was administered intravenously. Moderate Sedation Time: 34 minutes. The patient's level of consciousness and vital signs were monitored continuously by radiology nursing throughout the procedure under my direct supervision. FLUOROSCOPY TIME:  Fluoroscopic dose; 9 mGy COMPLICATIONS: None immediate. PROCEDURE: Informed written consent was obtained from the the patient and/or patient's representative after a discussion of the risks, benefits, and alternatives to treatment. Questions regarding the procedure were encouraged and answered. The LEFT neck and chest were prepped with chlorhexidine in a sterile fashion, and a sterile drape was applied covering the operative field. Maximum barrier sterile technique with sterile gowns and gloves were used for the procedure. A timeout was performed prior to the initiation of the procedure. After creating a small venotomy incision, a micropuncture kit was utilized to access the internal jugular vein. This was performed twice at the ipsilateral LEFT neck, longitudinally to allow for dual catheter placement. Real-time ultrasound guidance was utilized for  vascular access including the acquisition of a permanent ultrasound image documenting patency of the accessed vessel. The microwire was utilized to measure appropriate catheter length. The micropuncture sheath was exchanged for a peel-away sheath over a guidewire. A 5 Fr dual lumen tunneled central venous  catheter measuring 30 cm was tunneled in a retrograde fashion from the anterior chest wall to the venotomy incision. A 0.035 inch wire was advanced to the level of the IVC and the micropuncture sheath was exchanged for a peel-away sheath. A Mahurkar tunneled hemodialysis catheter measuring 28 cm from tip to cuff was tunneled in a retrograde fashion from the anterior chest wall to the venotomy incision. The tunneled central venous catheter then HD catheter were inserted through their respective peel-away sheaths, with tips appropriately positioned within the proximal RIGHT atrium. Final catheter positioning was confirmed and documented with a spot radiographic image. The catheters aspirate and flush normally. The HD catheter was flushed with appropriate volume heparin dwells. The catheter exit sites were secured with a 2-0 Ethilon retention suture. The venotomy incisions were was with Dermabond. Dressings were applied. The patient tolerated the procedure well without immediate post procedural complication. IMPRESSION: Successful placement of a 30 cm, 5 Fr dual lumen tunneled central venous "Powerline" catheter and a 28 cm tip-to-cuff tunneled hemodialysis catheter, ipsilateral via the LEFT internal jugular vein with tips terminating at the proximal RIGHT atrium. The catheters are ready for immediate use. Michaelle Birks, MD [facility] Radiology Electronically Signed   By: Michaelle Birks M.D.   On: 07/21/2022 14:49   IR Fluoro Guide CV Line Left  Result Date: 07/21/2022 INDICATION: ESRD on HD.  Infection requiring long-term IV antibiotics. EXAM: Procedures: 1.  ULTRASOUND AND FLUOROSCOPIC GUIDED PLACEMENT OF TUNNELED CENTRAL VENOUS "Powerline" CATHETER 2. TUNNELED CENTRAL VENOUS HEMODIALYSIS CATHETER PLACEMENT WITH ULTRASOUND AND FLUOROSCOPIC GUIDANCE MEDICATIONS: The patient was on scheduled IV antibiotics. ANESTHESIA/SEDATION: Moderate (conscious) sedation was employed during this procedure. A total of Versed 3 mg and Fentanyl 100 mcg was administered intravenously. Moderate Sedation Time: 34 minutes. The patient's level of consciousness and vital signs were monitored continuously by radiology nursing throughout the procedure under my direct supervision. FLUOROSCOPY TIME:  Fluoroscopic dose; 9 mGy COMPLICATIONS: None immediate. PROCEDURE: Informed written consent was obtained from the the patient and/or patient's representative after a discussion of the risks, benefits, and alternatives to treatment. Questions regarding the procedure were encouraged and answered. The LEFT neck and chest were prepped with chlorhexidine in a sterile fashion, and a sterile drape was applied covering the operative field. Maximum barrier sterile technique with sterile gowns and gloves were used for the procedure. A timeout was performed prior to the initiation of the procedure. After creating a small venotomy incision, a micropuncture kit was utilized to access the internal jugular vein. This was performed twice at the ipsilateral LEFT neck, longitudinally to allow for dual catheter placement. Real-time ultrasound guidance was utilized for vascular access including the acquisition of a permanent ultrasound image documenting patency of the accessed vessel. The microwire was utilized to measure appropriate catheter length. The micropuncture sheath was exchanged for a peel-away sheath over a guidewire. A 5 Fr dual lumen tunneled central venous catheter measuring 30 cm was tunneled in a retrograde fashion from the anterior chest wall to the venotomy incision. A 0.035 inch wire was advanced to the  level of the IVC and the micropuncture sheath was exchanged for a peel-away sheath. A Mahurkar tunneled hemodialysis catheter measuring 28 cm from tip to cuff was tunneled in a retrograde fashion from the anterior chest wall to the venotomy incision. The tunneled central venous catheter then HD catheter were inserted through their respective peel-away sheaths, with tips appropriately positioned within the proximal RIGHT atrium. Final catheter positioning was confirmed  and documented with a spot radiographic image. The catheters aspirate and flush normally. The HD catheter was flushed with appropriate volume heparin dwells. The catheter exit sites were secured with a 2-0 Ethilon retention suture. The venotomy incisions were was with Dermabond. Dressings were applied. The patient tolerated the procedure well without immediate post procedural complication. IMPRESSION: Successful placement of a 30 cm, 5 Fr dual lumen tunneled central venous "Powerline" catheter and a 28 cm tip-to-cuff tunneled hemodialysis catheter, ipsilateral via the LEFT internal jugular vein with tips terminating at the proximal RIGHT atrium. The catheters are ready for immediate use. Michaelle Birks, MD [facility] Radiology Electronically Signed   By: Michaelle Birks M.D.   On: 07/21/2022 14:49   IR Fluoro Guide CV Line Left  Result Date: 07/21/2022 INDICATION: ESRD on HD.  Infection requiring long-term IV antibiotics. EXAM: Procedures: 1. ULTRASOUND AND FLUOROSCOPIC GUIDED PLACEMENT OF TUNNELED CENTRAL VENOUS "Powerline" CATHETER 2. TUNNELED CENTRAL VENOUS HEMODIALYSIS CATHETER PLACEMENT WITH ULTRASOUND AND FLUOROSCOPIC GUIDANCE MEDICATIONS: The patient was on scheduled IV antibiotics. ANESTHESIA/SEDATION: Moderate (conscious) sedation was employed during this procedure. A total of Versed 3 mg and Fentanyl 100 mcg was administered intravenously. Moderate Sedation Time: 34 minutes. The patient's level  of consciousness and vital signs were monitored continuously by radiology nursing throughout the procedure under my direct supervision. FLUOROSCOPY TIME:  Fluoroscopic dose; 9 mGy COMPLICATIONS: None immediate. PROCEDURE: Informed written consent was obtained from the the patient and/or patient's representative after a discussion of the risks, benefits, and alternatives to treatment. Questions regarding the procedure were encouraged and answered. The LEFT neck and chest were prepped with chlorhexidine in a sterile fashion, and a sterile drape was applied covering the operative field. Maximum barrier sterile technique with sterile gowns and gloves were used for the procedure. A timeout was performed prior to the initiation of the procedure. After creating a small venotomy incision, a micropuncture kit was utilized to access the internal jugular vein. This was performed twice at the ipsilateral LEFT neck, longitudinally to allow for dual catheter placement. Real-time ultrasound guidance was utilized for vascular access including the acquisition of a permanent ultrasound image documenting patency of the accessed vessel. The microwire was utilized to measure appropriate catheter length. The micropuncture sheath was exchanged for a peel-away sheath over a guidewire. A 5 Fr dual lumen tunneled central venous catheter measuring 30 cm was tunneled in a retrograde fashion from the anterior chest wall to the venotomy incision. A 0.035 inch wire was advanced to the level of the IVC and the micropuncture sheath was exchanged for a peel-away sheath. A Mahurkar tunneled hemodialysis catheter measuring 28 cm from tip to cuff was tunneled in a retrograde fashion from the anterior chest wall to the venotomy incision. The tunneled central venous catheter then HD catheter were inserted through their respective peel-away sheaths, with tips appropriately positioned within the proximal RIGHT atrium. Final catheter positioning was  confirmed and documented with a spot radiographic image. The catheters aspirate and flush normally. The HD catheter was flushed with appropriate volume heparin dwells. The catheter exit sites were secured with a 2-0 Ethilon retention suture. The venotomy incisions were was with Dermabond. Dressings were applied. The patient tolerated the procedure well without immediate post procedural complication. IMPRESSION: Successful placement of a 30 cm, 5 Fr dual lumen tunneled central venous "Powerline" catheter and a 28 cm tip-to-cuff tunneled hemodialysis catheter, ipsilateral via the LEFT internal jugular vein with tips terminating at the proximal RIGHT atrium. The catheters  are ready for immediate use. Michaelle Birks, MD [facility] Radiology Electronically Signed   By: Michaelle Birks M.D.   On: 07/21/2022 14:49   IR US Guide Vasc Access Left  Result Date: 07/21/2022 INDICATION: ESRD on HD.  Infection requiring long-term IV antibiotics. EXAM: Procedures: 1. ULTRASOUND AND FLUOROSCOPIC GUIDED PLACEMENT OF TUNNELED CENTRAL VENOUS "Powerline" CATHETER 2. TUNNELED CENTRAL VENOUS HEMODIALYSIS CATHETER PLACEMENT WITH ULTRASOUND AND FLUOROSCOPIC GUIDANCE MEDICATIONS: The patient was on scheduled IV antibiotics. ANESTHESIA/SEDATION: Moderate (conscious) sedation was employed during this procedure. A total of Versed 3 mg and Fentanyl 100 mcg was administered intravenously. Moderate Sedation Time: 34 minutes. The patient's level of consciousness and vital signs were monitored continuously by radiology nursing throughout the procedure under my direct supervision. FLUOROSCOPY TIME:  Fluoroscopic dose; 9 mGy COMPLICATIONS: None immediate. PROCEDURE: Informed written consent was obtained from the the patient and/or patient's representative after a discussion of the risks, benefits, and alternatives to treatment. Questions regarding the procedure were encouraged and answered. The LEFT  neck and chest were prepped with chlorhexidine in a sterile fashion, and a sterile drape was applied covering the operative field. Maximum barrier sterile technique with sterile gowns and gloves were used for the procedure. A timeout was performed prior to the initiation of the procedure. After creating a small venotomy incision, a micropuncture kit was utilized to access the internal jugular vein. This was performed twice at the ipsilateral LEFT neck, longitudinally to allow for dual catheter placement. Real-time ultrasound guidance was utilized for vascular access including the acquisition of a permanent ultrasound image documenting patency of the accessed vessel. The microwire was utilized to measure appropriate catheter length. The micropuncture sheath was exchanged for a peel-away sheath over a guidewire. A 5 Fr dual lumen tunneled central venous catheter measuring 30 cm was tunneled in a retrograde fashion from the anterior chest wall to the venotomy incision. A 0.035 inch wire was advanced to the level of the IVC and the micropuncture sheath was exchanged for a peel-away sheath. A Mahurkar tunneled hemodialysis catheter measuring 28 cm from tip to cuff was tunneled in a retrograde fashion from the anterior chest wall to the venotomy incision. The tunneled central venous catheter then HD catheter were inserted through their respective peel-away sheaths, with tips appropriately positioned within the proximal RIGHT atrium. Final catheter positioning was confirmed and documented with a spot radiographic image. The catheters aspirate and flush normally. The HD catheter was flushed with appropriate volume heparin dwells. The catheter exit sites were secured with a 2-0 Ethilon retention suture. The venotomy incisions were was with Dermabond. Dressings were applied. The patient tolerated the procedure well without immediate post procedural complication. IMPRESSION: Successful placement of a 30 cm, 5 Fr dual lumen  tunneled central venous "Powerline" catheter and a 28 cm tip-to-cuff tunneled hemodialysis catheter, ipsilateral via the LEFT internal jugular vein with tips terminating at the proximal RIGHT atrium. The catheters are ready for immediate use. Michaelle Birks, MD [facility] Radiology Electronically Signed   By: Michaelle Birks M.D.   On: 07/21/2022 14:49    Cardiac Studies   Dopplers 07/19/22   Summary:  Right Carotid: The extracranial vessels were near-normal with only minimal  wall                thickening or plaque.   Left Carotid: The extracranial vessels were near-normal with only minimal  wall  thickening or plaque.  Vertebrals:  Bilateral vertebral arteries demonstrate antegrade flow.  Subclavians: Normal flow hemodynamics were seen in bilateral subclavian               arteries.   Right Upper Extremity: Doppler waveforms remain within normal limits with  right radial compression. Doppler waveform obliterate with right ulnar  compression.   Incidental finding: Distal right radial artery stenosis with peak systolic  velocities of 010 cm/s.   Left Upper Extremity: Doppler waveforms remain within normal limits with  left radial compression. Doppler waveforms remain within normal limits  with left ulnar compression.   Electronically signed by Monica Martinez MD on 07/19/2022 at 1:13:36 PM.   TEE 07/18/22  IMPRESSIONS     1. Left ventricular ejection fraction, by estimation, is 55 to 60%. The  left ventricle has normal function.   2. Right ventricular systolic function is normal. The right ventricular  size is normal.   3. No left atrial/left atrial appendage thrombus was detected.   4. 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   5. The mitral valve is normal in structure. No evidence of mitral valve  regurgitation.   6. Small defect with non central AI jet concerning for perforation of the  [place] approx. 0.2 cm. Small  vegetation seen on the [place] (image 59). aortic  valve is sclerotic with mild calcification. Central AI jet with VC ~0.67  cm and holodiastolic flow reversal   (image 56) was seen in the descending aorta. PHT 264 ms. Concerning for  borderline severe AI.   7. No aortic root abscess.   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   2D echo 07/16/22   1. Left ventricular ejection fraction, by estimation, is 50 to 55%. The  left ventricle has low normal function. The left ventricle has no regional  wall motion abnormalities. There is mild concentric left ventricular  hypertrophy. Left ventricular  diastolic parameters are consistent with Grade II diastolic dysfunction  (pseudonormalization).   2. Right ventricular systolic function is normal. The right ventricular  size is normal. Tricuspid regurgitation signal is inadequate for assessing  PA pressure.   3. The mitral valve is normal in structure. Trivial mitral valve  regurgitation. No evidence of mitral stenosis.   4. Dialysis catheter tip in right atrium.   5. The aortic valve is tricuspid. Aortic valve regurgitation is moderate.  No aortic stenosis is present. Cannot fully rule out small vegetation on  aortic valve. Suggest TEE to assess the aortic valve.   6. Aortic dilatation noted. 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   7. The inferior vena cava is dilated in size with >50% respiratory  variability, suggesting right atrial pressure of 8 mmHg.    Patient Profile     41 y.o. male with ESRD on HD, HTN, HLD, obesity, NICM with recovered EF (EF 20-25% in 2016 with normal cors at that time, improved to normal), DM, seizures, asthma, anemia who presented to the hospital with fever and infectious symptoms with chills, SOB, chest pain, nausea, and vomiting, found to have Enterococcus faecalis bacteremia. TEE + for IE of  AV.  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 - 2D Echo with mild dilation of ascending aorta and aortic root but not specifically mentioned on TEE - ID, CT surgery on board, on abx - cardiac CT  today for surgical planning; discussed with CT team for rate control (goal is CAD and valves) - I suspect he will leave today one way or another   H/o NICM, chronic combined CHF - recovered EF - volume managed per HD - limited therapy related to ESRD  ERSD on HD - per nephrology - per IR, tunneled central catheter placement planned today  Essential HTN - current rx - amlodipine 68m daily, carvedilol 37.566mBID, hydralazine 10028mID, SBP 120500B-704Ustolic - improves with HD  Remainder of issues per IM  For questions or updates, please contact [facility] consult [email or web address] for contact info under Cardiology/STEMI.    MahRudean HaskellD [id number] [id number]:00 AM

## 2022-07-22 NOTE — Progress Notes (Signed)
Nephrology Follow-Up Consult note   Assessment/Recommendations: Jeremy [name] is a/an 41 y.o. male with a past medical history significant for ESRD, admitted for bacteremia.     Subjective: HD last night, 3.3  L off.   Physical Exam: Constitutional: well-appearing, no acute distress ENMT: ears and nose without scars or lesions, MMM CV: normal rate, no edema Respiratory: no rales or wheezing, normal work of breathing Gastrointestinal: soft, non-tender, no palpable masses or hernias Skin: no visible lesions or rashes Psych: alert, judgement/insight appropriate, appropriate mood and affect  [facility]: [facility]  4h   400/800  124kg  2/2.5 bath  TDC  Hep 5000+ 4000 Mircera 94mcg IV q 4 weeks Venofer 50mg  weekly Hectorol 57mcg IV q HD   Assessment/Plan:  E faecalis bacteremia: Infectious disease on board. Arbutus removed on 07/17/2022. F/u cx's are negative x 72 hrs. New TDC placed by IR yesterday 11/13.  IV abx upon discharge per ID rec's.   ESRD:  TTS schedule. Had HD here on 11/8, then had line holiday and then had HD overnight last night. Declining HD today which is reasonable. If pt going home, will plan next HD at OP unit on 11/16.  Hypertension/volume: Some GG changes in BL lower lobes on chest CT 11/07, suspected to have been volume related. Down 1kg today, consider lower edw by 0.5-1 kg. On RA, no resp issues.   Anemia: Hgb ~8- 9. Last ESA dose given 06/26/22, will start aranesp here  Metabolic bone disease: Phos controlled, calcium at goal. Continue binders and hectorol.   Nutrition:  Albumin low, continue renal diet and protein supplements T2DM: On insulin Dispo: probable dc today  Kelly Splinter, MD 07/22/2022, 12:18 PM  Recent Labs  Lab 07/19/22 0247 07/20/22 0652 07/21/22 0331 07/22/22 0409  HGB 8.6*   < > 8.8* 9.2*  ALBUMIN 2.3*  2.3*  --   --  2.4*  2.5*  CALCIUM 8.6*   < > 8.4* 8.6*  PHOS 5.5*  --   --  5.4*  CREATININE 11.09*   < > 11.98* 7.51*  K 4.2   < > 4.3 3.9    < > = values in this interval not displayed.     Inpatient medications:  acetaminophen  1,000 mg Oral Q6H   amLODipine  10 mg Oral Daily   carvedilol  37.5 mg Oral BID WC   Chlorhexidine Gluconate Cloth  6 each Topical Q0600   Chlorhexidine Gluconate Cloth  6 each Topical Q0600   Chlorhexidine Gluconate Cloth  6 each Topical Q0600   cloNIDine  0.3 mg Oral BID   heparin  5,000 Units Subcutaneous Q8H   hydrALAZINE  100 mg Oral TID   insulin aspart  12 Units Subcutaneous TID WC   insulin glargine-yfgn  25 Units Subcutaneous QHS   lidocaine  1 patch Transdermal Q24H   methocarbamol  500 mg Oral TID   pantoprazole  20 mg Oral Daily   polyethylene glycol  17 g Oral Daily   senna-docusate  1 tablet Oral BID   sevelamer carbonate  1,600 mg Oral TID WC   sodium chloride flush  10-40 mL Intracatheter Q12H    gentamicin     gentamicin     vancomycin     vancomycin     alum & mag hydroxide-simeth, gentamicin, [START ON 07/23/2022] heparin, Muscle Rub, ondansetron (ZOFRAN) IV, sodium chloride flush

## 2022-07-22 NOTE — Discharge Summary (Addendum)
Name: Jeremy [name] MRN: [id number] DOB: Jul 18, 1981 41 y.o. PCP: Rick Duff, MD  Date of Admission: 07/15/2022 12:34 PM Date of Discharge: 07/22/2022 2:22 PM Attending Physician: Charise Killian, MD  Discharge Diagnosis: Principal Problem:   Fever Active Problems:   Primary hypertension   Enterococcal bacteremia   Catheter-related bloodstream infection   Enterococcus faecalis infection   Acute bacterial endocarditis   Nonrheumatic aortic valve insufficiency   Acute midline low back pain with right-sided sciatica   ESRD (end stage renal disease) ([place])   Aortic valve endocarditis   HFrEF (heart failure with reduced ejection fraction) ([place])   Bloodstream infection due to central venous catheter   Medication monitoring encounter   Bacteremia due to Enterococcus   Sepsis without acute organ dysfunction [facility])   Discharge Medications: Allergies as of 07/22/2022       Reactions   Acyclovir And Related Hives   Imdur [isosorbide Nitrate]    Headaches from nitrate        Medication List     STOP taking these medications    UNABLE TO FIND       TAKE these medications    Accu-Chek Guide test strip Generic drug: glucose blood USE TO CHECK BLOOD SUGAR 4 TIMES DAILY   Accu-Chek Guide w/Device Kit Use to test blood sugar   Accu-Chek Softclix Lancets lancets Use as directed to check blood sugar four times daily.   acetaminophen 500 MG tablet Commonly known as: TYLENOL Take 1,000 mg by mouth every 6 (six) hours as needed for moderate pain.   amLODipine 10 MG tablet Commonly known as: NORVASC Take 1 tablet (10 mg total) by mouth daily.   atorvastatin 80 MG tablet Commonly known as: LIPITOR Take 1 tablet (80 mg total) by mouth daily. NEEDS FOLLOW UP APPOINTMENT FOR ANYMORE REFILLS What changed: additional instructions   Auryxia 1 GM 210 MG(Fe) tablet Generic drug: ferric citrate Take 420 mg by mouth 3 (three) times daily. What changed: Another medication  with the same name was removed. Continue taking this medication, and follow the directions you see here.   blood glucose meter kit and supplies Kit Dispense based on patient and insurance preference. Use up to four times daily as directed. (FOR ICD-9 250.00, 250.01).   carvedilol 25 MG tablet Commonly known as: COREG Take 1.5 tablets (37.5 mg total) by mouth 2 (two) times daily with a meal. Need to schedule an appointment for further refills   cloNIDine 0.3 MG tablet Commonly known as: CATAPRES Take 1 tablet (0.3 mg total) by mouth 2 (two) times daily. What changed: Another medication with the same name was removed. Continue taking this medication, and follow the directions you see here.   FORA P20 Blood Pressure Cuff Misc Check your blood pressure everyday in the morning.   gentamicin 200 mg in dextrose 5 % 50 mL Inject 200 mg into the vein once for 1 dose.   gentamicin 150 mg in dextrose 5 % 50 mL Inject 150 mg into the vein Every Tuesday,Thursday,and Saturday with dialysis. Start taking on: July 24, 2022   glipiZIDE 5 MG tablet Commonly known as: GLUCOTROL Take 1 tablet (5 mg total) by mouth daily before breakfast.   HECTOROL IV Inject 1 Dose into the vein Every Tuesday,Thursday,and Saturday with dialysis.   hydrALAZINE 100 MG tablet Commonly known as: APRESOLINE Take 1 tablet (100 mg total) by mouth 3 (three) times daily.   ibuprofen 200 MG tablet Commonly known as: ADVIL Take 400 mg by mouth  every 6 (six) hours as needed for moderate pain.   Lantus SoloStar 100 UNIT/ML Solostar Pen Generic drug: insulin glargine Inject 37 Units into the skin at bedtime.   lidocaine 5 % Commonly known as: Lidoderm Place 1 patch onto the skin daily. Remove and Discard patch within 12 hours or as directed.   methocarbamol 500 MG tablet Commonly known as: ROBAXIN Take 1 tablet (500 mg total) by mouth 2 (two) times daily.   multivitamin Tabs tablet Take 1 tablet by mouth  daily.   nitroGLYCERIN 0.4 MG SL tablet Commonly known as: NITROSTAT PLACE 1 TABLET UNDER THE TONGUE EVERY 5 MINUTES AS NEEDED FOR CHEST PAIN. What changed:  how much to take how to take this when to take this reasons to take this additional instructions   NovoLOG FlexPen 100 UNIT/ML FlexPen Generic drug: insulin aspart Inject 15 Units into the skin 3 (three) times daily with meals. What changed: how much to take   ondansetron 4 MG disintegrating tablet Commonly known as: ZOFRAN-ODT Take 1 tablet (4 mg total) by mouth every 8 (eight) hours as needed for nausea or vomiting.   Renvela 800 MG tablet Generic drug: sevelamer carbonate Take 1,600 mg by mouth 3 (three) times daily with meals.   UltiCare Insulin Syringe 30G X 1/2" 0.5 ML Misc Generic drug: Insulin Syringe-Needle U-100 USE 4 TIMES DAILY WITH INSULIN INJECTIONS.   Unifine Pentips 31G X 5 MM Misc Generic drug: Insulin Pen Needle use four times daily with insulin   vancomycin 1-5 GM/200ML-% Soln Commonly known as: VANCOCIN Inject 200 mLs (1,000 mg total) into the vein Every Tuesday,Thursday,and Saturday with dialysis. Start taking on: July 24, 2022   vancomycin 3,000 mg in sodium chloride 0.9 % 500 mL Inject 3,000 mg into the vein once for 1 dose.               Durable Medical Equipment  (From admission, onward)           Start     Ordered   07/18/22 1041  For home use only DME Walker rolling  Once       Question Answer Comment  Walker: With 5 Inch Wheels   Patient needs a walker to treat with the following condition Weakness      07/18/22 1040            Disposition and follow-up:   [name] is a 41 y.o. year old with a history of ESRD on dialysis via tunneled HD catheter, admitted for E faecalis bacteremia, found to have aortic valve vegetation and aortic insufficiency concerning for endocarditis, discharged on a regimen of IV vancomycin and gentamicin to be administered  during hemodialysis days.  E faecalis bacteremia Native aortic valve endocarditis Severe aortic insufficiency Hemodialysis catheter replaced during hospitalization.  Blood cultures negative on discharge--final result.  Small bore CVC also placed but is no longer needed.  Will receive gentamicin and vancomycin via HD catheter during dialysis days.  Has CT surgery follow-up for aortic valve replacement. - Ensure patient is making dialysis appointments - Please make sure he has appointments with CT surgery - Ambulatory referral to IR for removal of small bore CVC placed at time of discharge  Lumbar back pain Due to degenerative disease versus osteomyelitis/discitis secondary to endocarditis. - Reassess at follow-up - Consider a repeat MRI lumbar spine with/without contrast within the next several months or sooner if indicated.  Pending labs and studies - HCV antibodies - Coronary CTA - 11/10  fungal culture  Follow-up Appointments:  Follow-up Information     [facility] Follow up.   Why: Please call 2-3 days in advance for transportation needs Contact information: [phone]        [facility] Schedule an appointment as soon as possible for a visit in 1 week(s).   Specialty: Rehabilitation Contact information: [address] [id number] mc [place] [phone]        Rick Duff, MD Follow up on 08/05/2022.   Specialty: Student Why: Please arrive at 3:00 for your appointment at 3:15 PM. Contact information: [address] [place] [address] [place]. Call in 3 day(s).   Specialty: Radiology Contact information: [address] [id number] [place] [phone]        Jabier Mutton, MD. Go on 08/13/2022.   Specialty: Infectious Diseases Contact  information: [address] [place] [address] [phone]                 Hospital Course by problem list: Principal Problem:   Fever Active Problems:   Primary hypertension   Enterococcal bacteremia   Catheter-related bloodstream infection   Enterococcus faecalis infection   Acute bacterial endocarditis   Nonrheumatic aortic valve insufficiency   Acute midline low back pain with right-sided sciatica   ESRD (end stage renal disease) ([facility])   Aortic valve endocarditis   HFrEF (heart failure with reduced ejection fraction) ([place])   Bloodstream infection due to central venous catheter   Medication monitoring encounter   Bacteremia due to Enterococcus   Sepsis without acute organ dysfunction ([place])  Resolved Problems:   * No resolved hospital problems. *  E faecalis bacteremia Central line associated bloodstream infection Native aortic valve endocarditis Severe aortic valve insufficiency Presented with several days to weeks of night sweats subjective fevers.  Was found to be febrile to approximately 102 F in the emergency room.  Blood cultures were drawn which grew E faecalis in 4/4 bottles, susceptible to ampicillin, vancomycin, gentamicin.  The source was presumed to be this patient's tunneled hemodialysis catheter.  A TEE was performed which demonstrated a vegetation on the aortic valve.  Infectious disease, cardiology, and cardiothoracic surgery were consulted for comanagement.  The hemodialysis catheter was replaced and an additional central line was placed in anticipation of a prolonged course of outpatient IV antibiotics.  He was treated initially with 1 day of vancomycin, cefepime, metronidazole.  This was narrowed to ampicillin and ceftriaxone IV which she received from November 8 to November 13.  Cardiothoracic surgery recommended repair of the aortic valve, to be completed after discharge.  He was discharged on a regimen of vancomycin and gentamicin to be  administered during hemodialysis days.  An ambulatory referral to interventional radiology was placed at time of discharge for removal of the superfluous small bore central venous catheter.  Back pain Had been seen in days prior to this hospitalization for this problem. At that time, it was thought to be sciatica. MRI on admission showed signs of degenerative disease, although it was noted that early discitis/osteomyelitis can have a similar appearance. Pain was well-controlled for duration of hospitalization and given location and description of pain, we suspect MSK pain +/- radiculopathy with lower likelihood of infection. Will need to have this reassessed at follow-up and repeat imaging done in next several months or if symptoms progress.  Nonischemic cardiomyopathy Hypertension Patient remained hemodynamically stable and without signs of overt chest tube or low output heart failure for the duration of his admission.  Echo demonstrated an ejection fraction of 55 to 60%.  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 Had HD catheter pulled due to bacteremia, then replaced. Tolerated dialysis well. Will resume TTS schedule on discharge with antibiotics to be administered during those sessions.  Type 2 diabetes Well controlled while hospitalized.  Discharge Exam:  Subjective: eager to leave the hospital. Feels well. No fevers, chills, or night sweats. No shortness of breath, chest pain, or dizziness.   Blood pressure 123/60, pulse 61, temperature 98.8 F (37.1 C), temperature source Oral, resp. rate 17, height _0  (1.956 m), weight 123.4 kg, SpO2 100 %. General: Well-appearing.  No apparent acute distress. Cardiovascular: Regular rate and rhythm.  Right radial pulse normal. Soft diastolic murmur heard best at left upper sternal border. Pulmonary: Breathing is regular and unlabored. Abdominal: Nondistended. Extremities: No pedal edema. Neurologic: No gross focal  deficits. Psychiatric: Pleasant. Appropriate mood and affect.  Pertinent studies and procedures:  07/15/22 Bcx 4/4 positive for E faecalis, pan-sensitive 07/17/22 Bcx 4/4 with no growth 07/18/22 fungal culture 2/2 no growth  07/16/22 TEE impression:  1. Left ventricular ejection fraction, by estimation, is 55 to 60%. The  left ventricle has normal function.   2. Right ventricular systolic function is normal. The right ventricular  size is normal.   3. No left atrial/left atrial appendage thrombus was detected.   4. 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   5. The mitral valve is normal in structure. No evidence of mitral valve  regurgitation.   6. Small defect with non central AI jet concerning for perforation of the  [place] approx. 0.2 cm. Small vegetation seen on the [place] (image 59). aortic  valve is sclerotic with mild calcification. Central AI jet with VC ~0.67  cm and holodiastolic flow reversal   (image 56) was seen in the descending aorta. PHT 264 ms. Concerning for  borderline severe AI.   7. No aortic root abscess.   07/15/22 MR lumbar spine impression: 1. L3-L4 mild spinal canal stenosis. Narrowing of the lateral recesses at this level could affect the descending L4 nerve roots. 2. Narrowing of the lateral recesses at L4-L5 could affect the descending L5 nerve roots. 3. No neural foraminal narrowing. 4. Edema in the anterior endplates about the F7-P1 disc space, without definite abnormal signal in the disc, favored to be degenerative, although early discitis osteomyelitis can appear similar. Correlate with lab values and consider short interval follow-up.  Discharge Instructions:   Discharge Instructions      [name]  You were admitted for fevers and night sweats and treated for a heart infection caused by bacteria, also known as endocarditis.  The infection is affecting your heart valve and prevents it from working properly.  You will need to have surgery to replace  the infected heart valve.  In the meantime you will receive antibiotics called vancomycin and gentamicin on the days that you get dialysis.  We are discharging you home now that you are doing better. To help assist you on your road to recovery, I have written the following recommendations:   Please be sure to attend all of your scheduled dialysis sessions.  This is even more important now because you will receive important antibiotics at those appointments.  I have referred you to interventional radiology for removal of the extra catheter.  If you  do not hear from their office in the next few days,  You will have lots of appointments with doctors in the near future.  Please make every effort to attend those appointments.  Each one is a step towards eradicating your heart infection.  Please call your doctor or go to the emergency room for signs or symptoms of worsening infection including fever, night sweats, chills, or unintentional weight loss; or if you have signs or symptoms of heart failure including dizziness, lightheadedness, fainting, difficulty breathing, or severe leg swelling.  It was a privilege to be a part of your hospital care team, and I hope you feel better as a result of your stay.  All the best, Nani Gasser, MD     Signed: Nani Gasser MD 07/22/2022, 2:22 PM   Pager: [phone]

## 2022-07-22 NOTE — Progress Notes (Signed)
PHARMACY CONSULT NOTE FOR:  OUTPATIENT  PARENTERAL ANTIBIOTIC THERAPY (OPAT)  Indication: Endocarditis  Regimen: Vancomycin 1000 mg IV qHD, Gentamicin 150 mg IV qHD  End date: 08/28/22  IV antibiotics will be given at scheduled HD sessions at discharge.    Thank you for allowing pharmacy to be a part of this patient's care.   Gena Fray, PharmD PGY1 Pharmacy Resident   07/22/2022 1:31 PM

## 2022-07-22 NOTE — Progress Notes (Signed)
Pharmacy Antibiotic Note  Jeremy [name] is a 41 y.o. male admitted on 07/15/2022 with  E. Faecalis endocarditis . Pharmacy has been consulted for vancomycin and gentamicin dosing.   Patient previously on ampicillin and ceftriaxone. Switched to vanc and gent given patient is on scheduled TTS HD PTA and abx can be given at HD sessions instead of daily through additional line. No need for central line antibiotics per ID. Will need to have removed prior to discharge. Patient has received HD cath and central line for antibiotics. Currently afebrile with WBC 10.1. Will need 2 weeks of antibiotics prior to surgery.   Plan: Give vancomycin loading 2500 mg IV x1  Start vancomycin 1000 mg IV qHD Give gent loading 200 mg IV x1 Start gentamicin 150 mg IV qHD Obtain vancomycin and gentamicin peak and trough levels to achieve therapeutic goal as appropriate  - vancomycin target levels 15-20  - gentamicin target trough <1, peak 3-4  Monitor for s/sx worsening infection,  Height: 6\' 5"  (195.6 cm) Weight: 123.4 kg (272 lb 0.8 oz) IBW/kg (Calculated) : 89.1  Temp (24hrs), Avg:98.4 F (36.9 C), Min:97.8 F (36.6 C), Max:98.8 F (37.1 C)  Recent Labs  Lab 07/15/22 1315 07/16/22 0139 07/18/22 0335 07/19/22 0247 07/20/22 0652 07/21/22 0331 07/22/22 0409  WBC 17.3*   < > 9.6 12.0* 11.5* 12.9* 10.1  CREATININE 9.10*   < > 9.76* 11.09* 11.77* 11.98* 7.51*  LATICACIDVEN 1.7  --   --   --   --   --   --    < > = values in this interval not displayed.    Estimated Creatinine Clearance: 18.6 mL/min (A) (by C-G formula based on SCr of 7.51 mg/dL (H)).    Allergies  Allergen Reactions   Acyclovir And Related Hives   Imdur [Isosorbide Nitrate]     Headaches from nitrate    Antimicrobials this admission: Ampicillin 11/7 >> 11/14 Cefepime 11/8 x1 Ceftriaxone 11/9 >> 11/14 Vancomycin 11/7 x1, 11/14 >> Gentamicin 11/14 >>  Dose adjustments this admission: N/A  Microbiology results: 11/7 BCx:  enterococcus faecalis - pan s 11/9 BCx: NGTD X5  11/11 MRSA PCR: negative   Thank you for allowing pharmacy to be a part of this patient's care.   Gena Fray, PharmD PGY1 Pharmacy Resident   07/22/2022 1:02 PM

## 2022-07-22 NOTE — Progress Notes (Signed)
[facility] Infectious Diseases Follow Up Note  Patient Identification: Patient Name: Jeremy [name] MRN: [id number] [place] Date: 07/15/2022 12:34 PM Age: 41 y.o.Today's Date: 07/22/2022  Reason for Visit: native AV endocarditis, probable early discitis and OM   Principal Problem:   Fever Active Problems:   Primary hypertension   Enterococcal bacteremia   Catheter-related bloodstream infection   Enterococcus faecalis infection   Acute bacterial endocarditis   Nonrheumatic aortic valve insufficiency   Acute midline low back pain with right-sided sciatica   ESRD (end stage renal disease) (HCC)   Aortic valve endocarditis   HFrEF (heart failure with reduced ejection fraction) ([place])   Bloodstream infection due to central venous catheter   Medication monitoring encounter   Bacteremia due to Enterococcus   Current Antibiotics:  11/14-c vanc 11/14-c gent  Ampicillin 11/7-14 Ceftriaxone 11/8-14  Lines/Hardwares: Nonfunctioning right upper ext distal AVF Left tunneled cath removed/line holiday, now s/p new left tunneled dialysis cath and ir placed tunneled picc 11/13     Assessment 42 year old male with PMH as below including ICM/CHF, 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 Found to have   # E faecalis bacteremia/Native AV endocarditis Repeat blood cx 11/9 NG in 4 days  TEE 11/10 Borderline Severe AI. Concern for aortic valve leaflet perforation 2/2 IE. SVC thrombus  CTVS - plan for AVR once pre op work up completed with dental eval, CT coronary angiogram and s/p 2 weeks of abtx   Due to high risk line infection and no superiority of ceftriaxone/amp over vanc/gent will do the latter  Tunneled ir placed picc can be removed -- primary team discussed with ir and this is to be removed  outpatient   # ESRD on HD # Lumbar degenerative disc disease with also concerns for early discitis and osteomyelitis in MRI   Treatment is as above Moderate lower back pain stable during admission   Recommendations Agree with 2 weeks course of IV abtx to sterilize valves prior to valve surgery. Please arrange for IR removal of tunnelled catheter as soon as possible after discharge He can get tiw vancomycin/gentamicin tiw with dialysis at dialysis center; they have their protocol for these antibiotics F/u ct surgery/cardiology per their discretion Follow up id clinic 12/6 @ 415 with dr [name] Will sign off Discussed with primary team   I spent 50 minute reviewing data/chart, and coordinating care and >50% direct face to face time providing counseling/discussing diagnostics/treatment plan with patient   ______________________________________________________________________ Subjective patient seen and examined at the bedside. Back pain is improving.   Vitals BP 123/60 (BP Location: Right Arm)   Pulse 61   Temp 98.8 F (37.1 C) (Oral)   Resp 17   Ht _0  (1.956 m)   Wt 123.4 kg   SpO2 100%   BMI 32.26 kg/m     Physical Exam General/constitutional: no distress, pleasant HEENT: Normocephalic, PER, Conj Clear, EOMI, Oropharynx clear Neck supple CV: rrr no mrg Lungs: clear to auscultation, normal respiratory effort Abd: Soft, Nontender Ext: no edema Skin: No Rash Neuro: nonfocal MSK: no peripheral joint tenderness/swelling; back not examined   Central line presence: left chest tunneled hd cath and picc    Pertinent Microbiology Results for orders placed or performed during the hospital encounter of 07/15/22  Culture, blood (Routine x 2)     Status: Abnormal   Collection Time: 07/15/22  1:06 PM  Specimen: BLOOD  Result Value Ref Range Status   Specimen Description BLOOD LEFT ANTECUBITAL  Final   Special Requests   Final    BOTTLES DRAWN AEROBIC AND ANAEROBIC  Blood Culture results may not be optimal due to an inadequate volume of blood received in culture bottles   Culture  Setup Time   Final    GRAM POSITIVE COCCI IN PAIRS IN CHAINS IN BOTH AEROBIC AND ANAEROBIC BOTTLES CRITICAL RESULT CALLED TO, READ BACK BY AND VERIFIED WITH: [name] RN 07/16/22 @ 0420 BY AB Performed at [facility], [place] [address], [name], [name] [address]    Culture ENTEROCOCCUS FAECALIS (A)  Final   Report Status 07/18/2022 FINAL  Final   Organism ID, Bacteria ENTEROCOCCUS FAECALIS  Final      Susceptibility   Enterococcus faecalis - MIC*    AMPICILLIN <=2 SENSITIVE Sensitive     VANCOMYCIN 1 SENSITIVE Sensitive     GENTAMICIN SYNERGY SENSITIVE Sensitive     * ENTEROCOCCUS FAECALIS  Blood Culture ID Panel (Reflexed)     Status: Abnormal   Collection Time: 07/15/22  1:06 PM  Result Value Ref Range Status   Enterococcus faecalis DETECTED (A) NOT DETECTED Final    Comment: CRITICAL RESULT CALLED TO, READ BACK BY AND VERIFIED WITH: K. MUNNETT RN 07/16/22 @ 0420 BY AB    Enterococcus Faecium NOT DETECTED NOT DETECTED Final   Listeria monocytogenes NOT DETECTED NOT DETECTED Final   Staphylococcus species NOT DETECTED NOT DETECTED Final   Staphylococcus aureus (BCID) NOT DETECTED NOT DETECTED Final   Staphylococcus epidermidis NOT DETECTED NOT DETECTED Final   Staphylococcus lugdunensis NOT DETECTED NOT DETECTED Final   Streptococcus species NOT DETECTED NOT DETECTED Final   Streptococcus agalactiae NOT DETECTED NOT DETECTED Final   Streptococcus pneumoniae NOT DETECTED NOT DETECTED Final   Streptococcus pyogenes NOT DETECTED NOT DETECTED Final   A.calcoaceticus-baumannii NOT DETECTED NOT DETECTED Final   Bacteroides fragilis NOT DETECTED NOT DETECTED Final   Enterobacterales NOT DETECTED NOT DETECTED Final   Enterobacter cloacae complex NOT DETECTED NOT DETECTED Final   Escherichia coli NOT DETECTED NOT DETECTED Final   Klebsiella aerogenes NOT DETECTED  NOT DETECTED Final   Klebsiella oxytoca NOT DETECTED NOT DETECTED Final   Klebsiella pneumoniae NOT DETECTED NOT DETECTED Final   Proteus species NOT DETECTED NOT DETECTED Final   Salmonella species NOT DETECTED NOT DETECTED Final   Serratia marcescens NOT DETECTED NOT DETECTED Final   Haemophilus influenzae NOT DETECTED NOT DETECTED Final   Neisseria meningitidis NOT DETECTED NOT DETECTED Final   Pseudomonas aeruginosa NOT DETECTED NOT DETECTED Final   Stenotrophomonas maltophilia NOT DETECTED NOT DETECTED Final   Candida albicans NOT DETECTED NOT DETECTED Final   Candida auris NOT DETECTED NOT DETECTED Final   Candida glabrata NOT DETECTED NOT DETECTED Final   Candida krusei NOT DETECTED NOT DETECTED Final   Candida parapsilosis NOT DETECTED NOT DETECTED Final   Candida tropicalis NOT DETECTED NOT DETECTED Final   Cryptococcus neoformans/gattii NOT DETECTED NOT DETECTED Final   Vancomycin resistance NOT DETECTED NOT DETECTED Final    Comment: Performed at [facility], [address], [place], [place] [address]  Culture, blood (Routine x 2)     Status: Abnormal   Collection Time: 07/15/22  3:00 PM   Specimen: BLOOD  Result Value Ref Range Status   Specimen Description BLOOD RIGHT ANTECUBITAL  Final   Special Requests   Final    BOTTLES DRAWN AEROBIC  AND ANAEROBIC Blood Culture adequate volume   Culture  Setup Time   Final    GRAM POSITIVE COCCI IN PAIRS IN CHAINS IN BOTH AEROBIC AND ANAEROBIC BOTTLES CRITICAL VALUE NOTED.  VALUE IS CONSISTENT WITH PREVIOUSLY REPORTED AND CALLED VALUE.    Culture (A)  Final    ENTEROCOCCUS FAECALIS SUSCEPTIBILITIES PERFORMED ON PREVIOUS CULTURE WITHIN THE LAST 5 DAYS. Performed at [facility], [place] [address], [place], [place] [address]    Report Status 07/18/2022 FINAL  Final  Resp Panel by RT-PCR (Flu A&B, Covid) Anterior Nasal Swab     Status: None   Collection Time: 07/15/22  3:08 PM   Specimen: Anterior Nasal Swab  Result  Value Ref Range Status   SARS Coronavirus 2 by RT PCR NEGATIVE NEGATIVE Final    Comment: (NOTE) SARS-CoV-2 target nucleic acids are NOT DETECTED.  The SARS-CoV-2 RNA is generally detectable in upper respiratory specimens during the acute phase of infection. The lowest concentration of SARS-CoV-2 viral copies this assay can detect is 138 copies/mL. A negative result does not preclude SARS-Cov-2 infection and should not be used as the sole basis for treatment or other patient management decisions. A negative result may occur with  improper specimen collection/handling, submission of specimen other than nasopharyngeal swab, presence of viral mutation(s) within the areas targeted by this assay, and inadequate number of viral copies(<138 copies/mL). A negative result must be combined with clinical observations, patient history, and epidemiological information. The expected result is Negative.  Fact Sheet for Patients:  EntrepreneurPulse.com.au  Fact Sheet for Healthcare Providers:  IncredibleEmployment.be  This test is no t yet approved or cleared by the [place] FDA and  has been authorized for detection and/or diagnosis of SARS-CoV-2 by FDA under an Emergency Use Authorization (EUA). This EUA will remain  in effect (meaning this test can be used) for the duration of the COVID-19 declaration under Section 564(b)(1) of the Act, 21 U.S.C.section 360bbb-3(b)(1), unless the authorization is terminated  or revoked sooner.       Influenza A by PCR NEGATIVE NEGATIVE Final   Influenza B by PCR NEGATIVE NEGATIVE Final    Comment: (NOTE) The Xpert Xpress SARS-CoV-2/FLU/RSV plus assay is intended as an aid in the diagnosis of influenza from Nasopharyngeal swab specimens and should not be used as a sole basis for treatment. Nasal washings and aspirates are unacceptable for Xpert Xpress SARS-CoV-2/FLU/RSV testing.  Fact Sheet for  Patients: EntrepreneurPulse.com.au  Fact Sheet for Healthcare Providers: IncredibleEmployment.be  This test is not yet approved or cleared by the [place] FDA and has been authorized for detection and/or diagnosis of SARS-CoV-2 by FDA under an Emergency Use Authorization (EUA). This EUA will remain in effect (meaning this test can be used) for the duration of the COVID-19 declaration under Section 564(b)(1) of the Act, 21 U.S.C. section 360bbb-3(b)(1), unless the authorization is terminated or revoked.  Performed at [facility], [place] [address] [place], [place] [address]   MRSA Next Gen by PCR, Nasal     Status: None   Collection Time: 07/16/22  6:12 PM   Specimen: Nasal Mucosa; Nasal Swab  Result Value Ref Range Status   MRSA by PCR Next Gen NOT DETECTED NOT DETECTED Final    Comment: (NOTE) The GeneXpert MRSA Assay (FDA approved for NASAL specimens only), is one component of a comprehensive MRSA colonization surveillance program. It is not intended to diagnose MRSA infection nor to guide or monitor treatment for MRSA infections. Test  performance is not FDA approved in patients less than 36 years old. Performed at [facility], [place] [address] [place], [place] [address]   Culture, blood (Routine X 2) w Reflex to ID Panel     Status: None   Collection Time: 07/17/22 11:00 AM   Specimen: BLOOD RIGHT ARM  Result Value Ref Range Status   Specimen Description BLOOD RIGHT ARM  Final   Special Requests   Final    BOTTLES DRAWN AEROBIC AND ANAEROBIC Blood Culture results may not be optimal due to an excessive volume of blood received in culture bottles   Culture   Final    NO GROWTH 5 DAYS Performed at [facility], [place] [address], [place], [place] [address]    Report Status 07/22/2022 FINAL  Final  Culture, blood (Routine X 2) w Reflex to ID Panel     Status: None   Collection Time: 07/17/22 11:19 AM   Specimen: BLOOD  RIGHT HAND  Result Value Ref Range Status   Specimen Description BLOOD RIGHT HAND  Final   Special Requests   Final    BOTTLES DRAWN AEROBIC AND ANAEROBIC Blood Culture results may not be optimal due to an excessive volume of blood received in culture bottles   Culture   Final    NO GROWTH 5 DAYS Performed at [facility], [place] [address] [place], [place] [address]    Report Status 07/22/2022 FINAL  Final  Fungus culture, blood     Status: None (Preliminary result)   Collection Time: 07/18/22  3:35 AM   Specimen: BLOOD  Result Value Ref Range Status   Specimen Description BLOOD RIGHT ANTECUBITAL  Final   Special Requests   Final    BOTTLES DRAWN AEROBIC AND ANAEROBIC Blood Culture adequate volume   Culture   Final    NO GROWTH 4 DAYS Performed at [facility], [place] [address], [place], [place] [address]    Report Status PENDING  Incomplete  Surgical pcr screen     Status: None   Collection Time: 07/19/22  7:25 AM   Specimen: Nasal Mucosa; Nasal Swab  Result Value Ref Range Status   MRSA, PCR NEGATIVE NEGATIVE Final   Staphylococcus aureus NEGATIVE NEGATIVE Final    Comment: (NOTE) The Xpert SA Assay (FDA approved for NASAL specimens in patients 46 years of age and older), is one component of a comprehensive surveillance program. It is not intended to diagnose infection nor to guide or monitor treatment. Performed at [facility], [place] [address] [place],  [address]    Pertinent Lab.    Latest Ref Rng & Units 07/22/2022    4:09 AM 07/21/2022    3:31 AM 07/20/2022    6:52 AM  CBC  WBC 4.0 - 10.5 K/uL 10.1  12.9  11.5   Hemoglobin 13.0 - 17.0 g/dL 9.2  8.8  9.0   Hematocrit 39.0 - 52.0 % 29.9  28.1  28.3   Platelets 150 - 400 K/uL 370  340  345       Latest Ref Rng & Units 07/22/2022    4:09 AM 07/21/2022    3:31 AM 07/20/2022    6:52 AM  CMP  Glucose 70 - 99 mg/dL 170  103  145   BUN 6 - 20 mg/dL 34  67  63   Creatinine 0.61 - 1.24 mg/dL  7.51  11.98  11.77   Sodium 135 - 145 mmol/L  138  136  138   Potassium 3.5 - 5.1 mmol/L 3.9  4.3  4.6   Chloride 98 - 111 mmol/L 99  101  104   CO2 22 - 32 mmol/L _0 Calcium 8.9 - 10.3 mg/dL 8.6  8.4  8.6   Total Protein 6.5 - 8.1 g/dL 7.6     Total Bilirubin 0.3 - 1.2 mg/dL 0.5     Alkaline Phos 38 - 126 U/L 38     AST 15 - 41 U/L 42     ALT 0 - 44 U/L 42        Pertinent Imaging today Plain films and CT images have been personally visualized and interpreted; radiology reports have been reviewed. Decision making incorporated into the Impression / Recommendations.  DG Chest 2 View  Result Date: 07/22/2022 CLINICAL DATA:  Fever. Infection on dialysis catheter with new 1 placed recently. EXAM: CHEST - 2 VIEW COMPARISON:  Chest two views 07/15/2022 FINDINGS: Redemonstration of dual-lumen left internal jugular central venous catheter with tip overlying the superior right atrium, unchanged. There is a new smaller caliber catheter from similar left internal jugular approach with the tip terminating over the more superior aspect of the superior vena cava. Cardiac silhouette and mediastinal contours are within limits. The lungs are clear. No pleural effusion or pneumothorax. Redemonstration of old posterior left rib fracture. No acute skeletal abnormality. IMPRESSION: 1. No acute cardiopulmonary process. 2. Unchanged left internal jugular approach temporary dialysis access catheter. New smaller caliber catheter from similar left internal jugular approach with the tip terminating over the more superior aspect of the superior vena cava. Electronically Signed   By: Yvonne Kendall M.D.   On: 07/22/2022 08:25   IR US Guide Vasc Access Left  Result Date: 07/21/2022 INDICATION: ESRD on HD.  Infection requiring long-term IV antibiotics. EXAM: Procedures: 1. ULTRASOUND AND FLUOROSCOPIC GUIDED PLACEMENT OF TUNNELED CENTRAL VENOUS "Powerline" CATHETER 2. TUNNELED CENTRAL VENOUS HEMODIALYSIS CATHETER  PLACEMENT WITH ULTRASOUND AND FLUOROSCOPIC GUIDANCE MEDICATIONS: The patient was on scheduled IV antibiotics. ANESTHESIA/SEDATION: Moderate (conscious) sedation was employed during this procedure. A total of Versed 3 mg and Fentanyl 100 mcg was administered intravenously. Moderate Sedation Time: 34 minutes. The patient's level of consciousness and vital signs were monitored continuously by radiology nursing throughout the procedure under my direct supervision. FLUOROSCOPY TIME:  Fluoroscopic dose; 9 mGy COMPLICATIONS: None immediate. PROCEDURE: Informed written consent was obtained from the the patient and/or patient's representative after a discussion of the risks, benefits, and alternatives to treatment. Questions regarding the procedure were encouraged and answered. The LEFT neck and chest were prepped with chlorhexidine in a sterile fashion, and a sterile drape was applied covering the operative field. Maximum barrier sterile technique with sterile gowns and gloves were used for the procedure. A timeout was performed prior to the initiation of the procedure. After creating a small venotomy incision, a micropuncture kit was utilized to access the internal jugular vein. This was performed twice at the ipsilateral LEFT neck, longitudinally to allow for dual catheter placement. Real-time ultrasound guidance was utilized for vascular access including the acquisition of a permanent ultrasound image documenting patency of the accessed vessel. The microwire was utilized to measure appropriate catheter length. The micropuncture sheath was exchanged for a peel-away sheath over a guidewire. A 5 Fr dual lumen tunneled central venous catheter measuring 30 cm was tunneled in a retrograde fashion from the anterior chest wall to the venotomy incision. A 0.035 inch wire  was advanced to the level of the IVC and the micropuncture sheath was exchanged for a peel-away sheath. A Mahurkar tunneled hemodialysis catheter measuring 28 cm  from tip to cuff was tunneled in a retrograde fashion from the anterior chest wall to the venotomy incision. The tunneled central venous catheter then HD catheter were inserted through their respective peel-away sheaths, with tips appropriately positioned within the proximal RIGHT atrium. Final catheter positioning was confirmed and documented with a spot radiographic image. The catheters aspirate and flush normally. The HD catheter was flushed with appropriate volume heparin dwells. The catheter exit sites were secured with a 2-0 Ethilon retention suture. The venotomy incisions were was with Dermabond. Dressings were applied. The patient tolerated the procedure well without immediate post procedural complication. IMPRESSION: Successful placement of a 30 cm, 5 Fr dual lumen tunneled central venous "Powerline" catheter and a 28 cm tip-to-cuff tunneled hemodialysis catheter, ipsilateral via the LEFT internal jugular vein with tips terminating at the proximal RIGHT atrium. The catheters are ready for immediate use. Michaelle Birks, MD [facility] Radiology Electronically Signed   By: Michaelle Birks M.D.   On: 07/21/2022 14:49   IR Fluoro Guide CV Line Left  Result Date: 07/21/2022 INDICATION: ESRD on HD.  Infection requiring long-term IV antibiotics. EXAM: Procedures: 1. ULTRASOUND AND FLUOROSCOPIC GUIDED PLACEMENT OF TUNNELED CENTRAL VENOUS "Powerline" CATHETER 2. TUNNELED CENTRAL VENOUS HEMODIALYSIS CATHETER PLACEMENT WITH ULTRASOUND AND FLUOROSCOPIC GUIDANCE MEDICATIONS: The patient was on scheduled IV antibiotics. ANESTHESIA/SEDATION: Moderate (conscious) sedation was employed during this procedure. A total of Versed 3 mg and Fentanyl 100 mcg was administered intravenously. Moderate Sedation Time: 34 minutes. The patient's level of consciousness and vital signs were monitored continuously by radiology nursing throughout the procedure under my direct supervision.  FLUOROSCOPY TIME:  Fluoroscopic dose; 9 mGy COMPLICATIONS: None immediate. PROCEDURE: Informed written consent was obtained from the the patient and/or patient's representative after a discussion of the risks, benefits, and alternatives to treatment. Questions regarding the procedure were encouraged and answered. The LEFT neck and chest were prepped with chlorhexidine in a sterile fashion, and a sterile drape was applied covering the operative field. Maximum barrier sterile technique with sterile gowns and gloves were used for the procedure. A timeout was performed prior to the initiation of the procedure. After creating a small venotomy incision, a micropuncture kit was utilized to access the internal jugular vein. This was performed twice at the ipsilateral LEFT neck, longitudinally to allow for dual catheter placement. Real-time ultrasound guidance was utilized for vascular access including the acquisition of a permanent ultrasound image documenting patency of the accessed vessel. The microwire was utilized to measure appropriate catheter length. The micropuncture sheath was exchanged for a peel-away sheath over a guidewire. A 5 Fr dual lumen tunneled central venous catheter measuring 30 cm was tunneled in a retrograde fashion from the anterior chest wall to the venotomy incision. A 0.035 inch wire was advanced to the level of the IVC and the micropuncture sheath was exchanged for a peel-away sheath. A Mahurkar tunneled hemodialysis catheter measuring 28 cm from tip to cuff was tunneled in a retrograde fashion from the anterior chest wall to the venotomy incision. The tunneled central venous catheter then HD catheter were inserted through their respective peel-away sheaths, with tips appropriately positioned within the proximal RIGHT atrium. Final catheter positioning was confirmed and documented with a spot radiographic image. The catheters aspirate and flush normally. The HD catheter was flushed with  appropriate volume heparin  dwells. The catheter exit sites were secured with a 2-0 Ethilon retention suture. The venotomy incisions were was with Dermabond. Dressings were applied. The patient tolerated the procedure well without immediate post procedural complication. IMPRESSION: Successful placement of a 30 cm, 5 Fr dual lumen tunneled central venous "Powerline" catheter and a 28 cm tip-to-cuff tunneled hemodialysis catheter, ipsilateral via the LEFT internal jugular vein with tips terminating at the proximal RIGHT atrium. The catheters are ready for immediate use. Michaelle Birks, MD [facility] Radiology Electronically Signed   By: Michaelle Birks M.D.   On: 07/21/2022 14:49   IR Fluoro Guide CV Line Left  Result Date: 07/21/2022 INDICATION: ESRD on HD.  Infection requiring long-term IV antibiotics. EXAM: Procedures: 1. ULTRASOUND AND FLUOROSCOPIC GUIDED PLACEMENT OF TUNNELED CENTRAL VENOUS "Powerline" CATHETER 2. TUNNELED CENTRAL VENOUS HEMODIALYSIS CATHETER PLACEMENT WITH ULTRASOUND AND FLUOROSCOPIC GUIDANCE MEDICATIONS: The patient was on scheduled IV antibiotics. ANESTHESIA/SEDATION: Moderate (conscious) sedation was employed during this procedure. A total of Versed 3 mg and Fentanyl 100 mcg was administered intravenously. Moderate Sedation Time: 34 minutes. The patient's level of consciousness and vital signs were monitored continuously by radiology nursing throughout the procedure under my direct supervision. FLUOROSCOPY TIME:  Fluoroscopic dose; 9 mGy COMPLICATIONS: None immediate. PROCEDURE: Informed written consent was obtained from the the patient and/or patient's representative after a discussion of the risks, benefits, and alternatives to treatment. Questions regarding the procedure were encouraged and answered. The LEFT neck and chest were prepped with chlorhexidine in a sterile fashion, and a sterile drape was applied covering the operative field.  Maximum barrier sterile technique with sterile gowns and gloves were used for the procedure. A timeout was performed prior to the initiation of the procedure. After creating a small venotomy incision, a micropuncture kit was utilized to access the internal jugular vein. This was performed twice at the ipsilateral LEFT neck, longitudinally to allow for dual catheter placement. Real-time ultrasound guidance was utilized for vascular access including the acquisition of a permanent ultrasound image documenting patency of the accessed vessel. The microwire was utilized to measure appropriate catheter length. The micropuncture sheath was exchanged for a peel-away sheath over a guidewire. A 5 Fr dual lumen tunneled central venous catheter measuring 30 cm was tunneled in a retrograde fashion from the anterior chest wall to the venotomy incision. A 0.035 inch wire was advanced to the level of the IVC and the micropuncture sheath was exchanged for a peel-away sheath. A Mahurkar tunneled hemodialysis catheter measuring 28 cm from tip to cuff was tunneled in a retrograde fashion from the anterior chest wall to the venotomy incision. The tunneled central venous catheter then HD catheter were inserted through their respective peel-away sheaths, with tips appropriately positioned within the proximal RIGHT atrium. Final catheter positioning was confirmed and documented with a spot radiographic image. The catheters aspirate and flush normally. The HD catheter was flushed with appropriate volume heparin dwells. The catheter exit sites were secured with a 2-0 Ethilon retention suture. The venotomy incisions were was with Dermabond. Dressings were applied. The patient tolerated the procedure well without immediate post procedural complication. IMPRESSION: Successful placement of a 30 cm, 5 Fr dual lumen tunneled central venous "Powerline" catheter and a 28 cm tip-to-cuff tunneled hemodialysis catheter, ipsilateral via the LEFT internal  jugular vein with tips terminating at the proximal RIGHT atrium. The catheters are ready for immediate use. Michaelle Birks, MD [facility] Radiology Electronically Signed   By: Wille Glaser  Mugweru M.D.   On: 07/21/2022 14:49   IR US Guide Vasc Access Left  Result Date: 07/21/2022 INDICATION: ESRD on HD.  Infection requiring long-term IV antibiotics. EXAM: Procedures: 1. ULTRASOUND AND FLUOROSCOPIC GUIDED PLACEMENT OF TUNNELED CENTRAL VENOUS "Powerline" CATHETER 2. TUNNELED CENTRAL VENOUS HEMODIALYSIS CATHETER PLACEMENT WITH ULTRASOUND AND FLUOROSCOPIC GUIDANCE MEDICATIONS: The patient was on scheduled IV antibiotics. ANESTHESIA/SEDATION: Moderate (conscious) sedation was employed during this procedure. A total of Versed 3 mg and Fentanyl 100 mcg was administered intravenously. Moderate Sedation Time: 34 minutes. The patient's level of consciousness and vital signs were monitored continuously by radiology nursing throughout the procedure under my direct supervision. FLUOROSCOPY TIME:  Fluoroscopic dose; 9 mGy COMPLICATIONS: None immediate. PROCEDURE: Informed written consent was obtained from the the patient and/or patient's representative after a discussion of the risks, benefits, and alternatives to treatment. Questions regarding the procedure were encouraged and answered. The LEFT neck and chest were prepped with chlorhexidine in a sterile fashion, and a sterile drape was applied covering the operative field. Maximum barrier sterile technique with sterile gowns and gloves were used for the procedure. A timeout was performed prior to the initiation of the procedure. After creating a small venotomy incision, a micropuncture kit was utilized to access the internal jugular vein. This was performed twice at the ipsilateral LEFT neck, longitudinally to allow for dual catheter placement. Real-time ultrasound guidance was utilized for vascular access including the acquisition of  a permanent ultrasound image documenting patency of the accessed vessel. The microwire was utilized to measure appropriate catheter length. The micropuncture sheath was exchanged for a peel-away sheath over a guidewire. A 5 Fr dual lumen tunneled central venous catheter measuring 30 cm was tunneled in a retrograde fashion from the anterior chest wall to the venotomy incision. A 0.035 inch wire was advanced to the level of the IVC and the micropuncture sheath was exchanged for a peel-away sheath. A Mahurkar tunneled hemodialysis catheter measuring 28 cm from tip to cuff was tunneled in a retrograde fashion from the anterior chest wall to the venotomy incision. The tunneled central venous catheter then HD catheter were inserted through their respective peel-away sheaths, with tips appropriately positioned within the proximal RIGHT atrium. Final catheter positioning was confirmed and documented with a spot radiographic image. The catheters aspirate and flush normally. The HD catheter was flushed with appropriate volume heparin dwells. The catheter exit sites were secured with a 2-0 Ethilon retention suture. The venotomy incisions were was with Dermabond. Dressings were applied. The patient tolerated the procedure well without immediate post procedural complication. IMPRESSION: Successful placement of a 30 cm, 5 Fr dual lumen tunneled central venous "Powerline" catheter and a 28 cm tip-to-cuff tunneled hemodialysis catheter, ipsilateral via the LEFT internal jugular vein with tips terminating at the proximal RIGHT atrium. The catheters are ready for immediate use. Michaelle Birks, MD [facility] Radiology Electronically Signed   By: Michaelle Birks M.D.   On: 07/21/2022 14:49          Jabier Mutton, [place] for [place] [phone]  pager   [phone] cell 07/22/2022, 1:49 PM

## 2022-07-22 NOTE — Progress Notes (Signed)
PT Cancellation Note  Patient Details Name: Jeremy [name] MRN: [id number] DOB: [birth year]   Cancelled Treatment:    Reason Eval/Treat Not Completed: Patient at procedure or test/unavailable; out of room for testing and note plans for HD as well prior to d/c.  Will attempt again later as schedule permits and pt available.    Reginia Naas 07/22/2022, 1:08 PM Magda Kiel, PT Acute Rehabilitation Services Office:[phone] 07/22/2022

## 2022-07-22 NOTE — Discharge Instructions (Addendum)
[name]  You were admitted for fevers and night sweats and treated for a heart infection caused by bacteria, also known as endocarditis.  The infection is affecting your heart valve and prevents it from working properly.  You will need to have surgery to replace the infected heart valve.  In the meantime you will receive antibiotics called vancomycin and gentamicin on the days that you get dialysis.  We are discharging you home now that you are doing better. To help assist you on your road to recovery, I have written the following recommendations:   Please be sure to attend all of your scheduled dialysis sessions.  This is even more important now because you will receive important antibiotics at those appointments.  I have referred you to interventional radiology for removal of the extra catheter.  If you do not hear from their office in the next few days,  You will have lots of appointments with doctors in the near future.  Please make every effort to attend those appointments.  Each one is a step towards eradicating your heart infection.  Please call your doctor or go to the emergency room for signs or symptoms of worsening infection including fever, night sweats, chills, or unintentional weight loss; or if you have signs or symptoms of heart failure including dizziness, lightheadedness, fainting, difficulty breathing, or severe leg swelling.  It was a privilege to be a part of your hospital care team, and I hope you feel better as a result of your stay.  All the best, Nani Gasser, MD

## 2022-07-22 NOTE — Progress Notes (Signed)
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  Echo shows no other valvular involvement.  No evidence of root abscess.  Cardiac CTA with coronary flow study has been completed, report pending. Patient has been seen by dental and recommended for extraction of necrotic molar teeth. Patient's dialysis access has been changed to a dual-lumen tunneled catheter via the left IJ and has received the HD earlier today on a Tuesday Thursday Saturday schedule. Patient will receive IV vancomycin and gentamicin as an outpatient with each dialysis session. I will plan on seeing the patient back in the office for follow-up after his dental extractions to schedule surgery after he has had a least 2 weeks of IV antibiotics to reduce the risk of recurrent infection in a new valve. He is lumbar back pain somewhat better but still present. Objective: Vital signs in last 24 hours: Temp:  [97.8 F (36.6 C)-98.8 F (37.1 C)] 98.8 F (37.1 C) (11/14 1133) Pulse Rate:  [61-100] 61 (11/14 1133) Cardiac Rhythm: Atrial fibrillation (11/13 1610) Resp:  [16-27] 17 (11/14 0833) BP: (97-171)/(41-101) 123/60 (11/14 1133) SpO2:  [96 %-100 %] 100 % (11/14 1133) Weight:  [123.4 kg] 123.4 kg (11/13 1600)  Hemodynamic parameters for last 24 hours:    Intake/Output from previous day: 11/13 0701 - 11/14 0700 In: 100 [IV Piggyback:100] Out: 3650 [Urine:350] Intake/Output this shift: No intake/output data recorded.  Exam Soft flow murmur in the right upper sternal border, normal sinus rhythm Lungs clear Tunneled HD catheter dressing clean and dry Neuro intact No petechiae  Lab Results: Recent Labs    07/21/22 0331 07/22/22 0409  WBC 12.9* 10.1  HGB 8.8* 9.2*  HCT 28.1* 29.9*  PLT 340 370   BMET:  Recent Labs    07/21/22 0331 07/22/22 0409  NA 136 138  K 4.3 3.9  CL 101 99  CO2 19*  27  GLUCOSE 103* 170*  BUN 67* 34*  CREATININE 11.98* 7.51*  CALCIUM 8.4* 8.6*    PT/INR: No results for input(s): "LABPROT", "INR" in the last 72 hours. ABG    Component Value Date/Time   PHART 7.470 (H) 02/01/2015 1125   HCO3 23.9 02/01/2015 1125   TCO2 27 08/12/2021 1012   ACIDBASEDEF 3.0 (H) 11/08/2013 1344   O2SAT 56.2 06/03/2016 0358   CBG (last 3)  Recent Labs    07/21/22 2139 07/22/22 0613 07/22/22 1204  GLUCAP 126* 162* 118*    Assessment/Plan: S/P Procedure(s) (LRB): TRANSESOPHAGEAL ECHOCARDIOGRAM (TEE) (N/A) Patient would benefit from aortic valve replacement with a biologic tissue valve which could be placed at a later date with TAVR if needed. He needs dental extraction of necrotic teeth and at least 2 weeks of IV antibiotics prior to cardiac surgery. I will follow the patient in the clinic and arrange for surgery within the next 3 to 4 weeks.  Discussed the plan with patient and he understands especially importance of not missing any of his antibiotic infusions.  LOS: 7 days    Dahlia Byes 07/22/2022

## 2022-07-22 NOTE — Progress Notes (Signed)
PT Cancellation Note  Patient Details Name: Jeremy [name] MRN: [id number] DOB: [birth year]   Cancelled Treatment:    Reason Eval/Treat Not Completed: Other (comment); RN in the room in process to discharge pt.  Touched base and pt without concerns, walker delivered to the room and reports follow up PT set up.  Further PT deferred to post-acute care as pt discharge to home today.    Reginia Naas 07/22/2022, 3:06 PM Magda Kiel, PT Acute Rehabilitation Services Office:[phone] 07/22/2022

## 2022-07-23 ENCOUNTER — Other Ambulatory Visit (HOSPITAL_COMMUNITY): Payer: Self-pay

## 2022-07-23 ENCOUNTER — Telehealth: Payer: Self-pay

## 2022-07-23 DIAGNOSIS — K045 Chronic apical periodontitis: Secondary | ICD-10-CM

## 2022-07-23 DIAGNOSIS — Z01818 Encounter for other preprocedural examination: Secondary | ICD-10-CM

## 2022-07-23 DIAGNOSIS — K0889 Other specified disorders of teeth and supporting structures: Secondary | ICD-10-CM

## 2022-07-23 DIAGNOSIS — K029 Dental caries, unspecified: Secondary | ICD-10-CM

## 2022-07-23 DIAGNOSIS — K056 Periodontal disease, unspecified: Secondary | ICD-10-CM

## 2022-07-23 LAB — HCV AB W REFLEX TO QUANT PCR: HCV Ab: NONREACTIVE

## 2022-07-23 LAB — HCV INTERPRETATION

## 2022-07-23 MED ORDER — ONDANSETRON HCL 4 MG PO TABS
4.0000 mg | ORAL_TABLET | Freq: Every day | ORAL | 11 refills | Status: DC | PRN
  Filled 2022-07-23: qty 30, 30d supply, fill #0

## 2022-07-23 NOTE — Patient Outreach (Signed)
Transition Care Management Follow-up Telephone Call Date of discharge and from where: 07/22/22 [facility] How have you been since you were released from the hospital? Okay Any questions or concerns? Yes  Items Reviewed: Did the pt receive and understand the discharge instructions provided? Yes  Medications obtained and verified? Yes  Other? No  Any new allergies since your discharge? No  Dietary orders reviewed? No Do you have support at home? Yes   Home Care and Equipment/Supplies: Were home health services ordered? not applicable If so, what is the name of the agency? Has the agency set up a time to come to the patient's home? not applicable Were any new equipment or medical supplies ordered?  No What is the name of the medical supply agency?  Were you able to get the supplies/equipment? not applicable Do you have any questions related to the use of the equipment or supplies? No  Functional Questionnaire: (I = Independent and D = Dependent) ADLs: I  Bathing/Dressing- I  Meal Prep- I  Eating- I  Maintaining continence- I  Transferring/Ambulation- I  Managing Meds- I  Follow up appointments reviewed:  PCP Hospital f/u appt confirmed? Yes  Scheduled to see [place] on 08/05/22 @ 3:15. [facility] f/u appt confirmed? No  Scheduled to see . Are transportation arrangements needed? No  If their condition worsens, is the pt aware to call PCP or go to the Emergency Dept.? Yes Was the patient provided with contact information for the PCP's office or ED? Yes Was to pt encouraged to call back with questions or concerns? Yes Mickel Fuchs, BSW, [place] Managed Medicaid Team  [phone]

## 2022-07-24 ENCOUNTER — Other Ambulatory Visit (HOSPITAL_COMMUNITY): Payer: Self-pay

## 2022-07-24 DIAGNOSIS — Z992 Dependence on renal dialysis: Secondary | ICD-10-CM | POA: Diagnosis not present

## 2022-07-24 DIAGNOSIS — N2581 Secondary hyperparathyroidism of renal origin: Secondary | ICD-10-CM | POA: Diagnosis not present

## 2022-07-24 DIAGNOSIS — N186 End stage renal disease: Secondary | ICD-10-CM | POA: Diagnosis not present

## 2022-07-24 DIAGNOSIS — R7881 Bacteremia: Secondary | ICD-10-CM | POA: Diagnosis not present

## 2022-07-24 DIAGNOSIS — D509 Iron deficiency anemia, unspecified: Secondary | ICD-10-CM | POA: Diagnosis not present

## 2022-07-24 MED ORDER — ONDANSETRON 4 MG PO TBDP
4.0000 mg | ORAL_TABLET | Freq: Three times a day (TID) | ORAL | 3 refills | Status: DC | PRN
  Filled 2022-07-24: qty 30, 10d supply, fill #0

## 2022-07-25 ENCOUNTER — Other Ambulatory Visit (HOSPITAL_COMMUNITY): Payer: Self-pay

## 2022-07-25 ENCOUNTER — Encounter (HOSPITAL_COMMUNITY): Payer: Self-pay | Admitting: *Deleted

## 2022-07-25 LAB — FUNGUS CULTURE, BLOOD
Culture: NO GROWTH
Special Requests: ADEQUATE

## 2022-07-28 ENCOUNTER — Telehealth (HOSPITAL_COMMUNITY): Payer: Self-pay

## 2022-07-28 DIAGNOSIS — D509 Iron deficiency anemia, unspecified: Secondary | ICD-10-CM | POA: Diagnosis not present

## 2022-07-28 DIAGNOSIS — R7881 Bacteremia: Secondary | ICD-10-CM | POA: Diagnosis not present

## 2022-07-28 DIAGNOSIS — Z992 Dependence on renal dialysis: Secondary | ICD-10-CM | POA: Diagnosis not present

## 2022-07-28 DIAGNOSIS — N186 End stage renal disease: Secondary | ICD-10-CM | POA: Diagnosis not present

## 2022-07-28 DIAGNOSIS — N2581 Secondary hyperparathyroidism of renal origin: Secondary | ICD-10-CM | POA: Diagnosis not present

## 2022-07-28 DIAGNOSIS — D689 Coagulation defect, unspecified: Secondary | ICD-10-CM | POA: Diagnosis not present

## 2022-07-28 DIAGNOSIS — D631 Anemia in chronic kidney disease: Secondary | ICD-10-CM | POA: Diagnosis not present

## 2022-08-02 DIAGNOSIS — N186 End stage renal disease: Secondary | ICD-10-CM | POA: Diagnosis not present

## 2022-08-02 DIAGNOSIS — N2581 Secondary hyperparathyroidism of renal origin: Secondary | ICD-10-CM | POA: Diagnosis not present

## 2022-08-02 DIAGNOSIS — D631 Anemia in chronic kidney disease: Secondary | ICD-10-CM | POA: Diagnosis not present

## 2022-08-02 DIAGNOSIS — Z992 Dependence on renal dialysis: Secondary | ICD-10-CM | POA: Diagnosis not present

## 2022-08-02 DIAGNOSIS — D509 Iron deficiency anemia, unspecified: Secondary | ICD-10-CM | POA: Diagnosis not present

## 2022-08-02 DIAGNOSIS — D689 Coagulation defect, unspecified: Secondary | ICD-10-CM | POA: Diagnosis not present

## 2022-08-02 DIAGNOSIS — R7881 Bacteremia: Secondary | ICD-10-CM | POA: Diagnosis not present

## 2022-08-05 ENCOUNTER — Encounter: Payer: Medicaid Other | Admitting: Student

## 2022-08-05 DIAGNOSIS — N186 End stage renal disease: Secondary | ICD-10-CM | POA: Diagnosis not present

## 2022-08-05 DIAGNOSIS — Z992 Dependence on renal dialysis: Secondary | ICD-10-CM | POA: Diagnosis not present

## 2022-08-05 DIAGNOSIS — D689 Coagulation defect, unspecified: Secondary | ICD-10-CM | POA: Diagnosis not present

## 2022-08-05 DIAGNOSIS — R7881 Bacteremia: Secondary | ICD-10-CM | POA: Diagnosis not present

## 2022-08-05 DIAGNOSIS — D509 Iron deficiency anemia, unspecified: Secondary | ICD-10-CM | POA: Diagnosis not present

## 2022-08-05 DIAGNOSIS — N2581 Secondary hyperparathyroidism of renal origin: Secondary | ICD-10-CM | POA: Diagnosis not present

## 2022-08-06 ENCOUNTER — Encounter: Payer: Self-pay | Admitting: Student

## 2022-08-07 DIAGNOSIS — E1129 Type 2 diabetes mellitus with other diabetic kidney complication: Secondary | ICD-10-CM | POA: Diagnosis not present

## 2022-08-07 DIAGNOSIS — N186 End stage renal disease: Secondary | ICD-10-CM | POA: Diagnosis not present

## 2022-08-07 DIAGNOSIS — R7881 Bacteremia: Secondary | ICD-10-CM | POA: Diagnosis not present

## 2022-08-07 DIAGNOSIS — N2581 Secondary hyperparathyroidism of renal origin: Secondary | ICD-10-CM | POA: Diagnosis not present

## 2022-08-07 DIAGNOSIS — D689 Coagulation defect, unspecified: Secondary | ICD-10-CM | POA: Diagnosis not present

## 2022-08-07 DIAGNOSIS — D509 Iron deficiency anemia, unspecified: Secondary | ICD-10-CM | POA: Diagnosis not present

## 2022-08-07 DIAGNOSIS — Z992 Dependence on renal dialysis: Secondary | ICD-10-CM | POA: Diagnosis not present

## 2022-08-08 ENCOUNTER — Other Ambulatory Visit (HOSPITAL_COMMUNITY): Payer: Self-pay | Admitting: Cardiology

## 2022-08-08 ENCOUNTER — Other Ambulatory Visit (HOSPITAL_COMMUNITY): Payer: Self-pay | Admitting: *Deleted

## 2022-08-08 ENCOUNTER — Other Ambulatory Visit (HOSPITAL_COMMUNITY): Payer: Self-pay

## 2022-08-08 MED ORDER — CLONIDINE HCL 0.3 MG PO TABS
0.3000 mg | ORAL_TABLET | Freq: Two times a day (BID) | ORAL | 0 refills | Status: DC
  Filled 2022-08-08: qty 60, 30d supply, fill #0

## 2022-08-08 MED ORDER — CARVEDILOL 25 MG PO TABS
37.5000 mg | ORAL_TABLET | Freq: Two times a day (BID) | ORAL | 0 refills | Status: DC
  Filled 2022-08-08: qty 270, 90d supply, fill #0

## 2022-08-09 DIAGNOSIS — D689 Coagulation defect, unspecified: Secondary | ICD-10-CM | POA: Diagnosis not present

## 2022-08-09 DIAGNOSIS — N186 End stage renal disease: Secondary | ICD-10-CM | POA: Diagnosis not present

## 2022-08-09 DIAGNOSIS — Z992 Dependence on renal dialysis: Secondary | ICD-10-CM | POA: Diagnosis not present

## 2022-08-09 DIAGNOSIS — N2581 Secondary hyperparathyroidism of renal origin: Secondary | ICD-10-CM | POA: Diagnosis not present

## 2022-08-09 DIAGNOSIS — R7881 Bacteremia: Secondary | ICD-10-CM | POA: Diagnosis not present

## 2022-08-11 ENCOUNTER — Ambulatory Visit: Payer: Medicaid Other | Admitting: Cardiothoracic Surgery

## 2022-08-12 DIAGNOSIS — D631 Anemia in chronic kidney disease: Secondary | ICD-10-CM | POA: Diagnosis not present

## 2022-08-12 DIAGNOSIS — D509 Iron deficiency anemia, unspecified: Secondary | ICD-10-CM | POA: Diagnosis not present

## 2022-08-12 DIAGNOSIS — R7881 Bacteremia: Secondary | ICD-10-CM | POA: Diagnosis not present

## 2022-08-12 DIAGNOSIS — Z992 Dependence on renal dialysis: Secondary | ICD-10-CM | POA: Diagnosis not present

## 2022-08-12 DIAGNOSIS — N186 End stage renal disease: Secondary | ICD-10-CM | POA: Diagnosis not present

## 2022-08-12 DIAGNOSIS — D689 Coagulation defect, unspecified: Secondary | ICD-10-CM | POA: Diagnosis not present

## 2022-08-12 DIAGNOSIS — N2581 Secondary hyperparathyroidism of renal origin: Secondary | ICD-10-CM | POA: Diagnosis not present

## 2022-08-13 ENCOUNTER — Encounter: Payer: Self-pay | Admitting: Internal Medicine

## 2022-08-13 ENCOUNTER — Ambulatory Visit: Payer: Medicaid Other | Admitting: Internal Medicine

## 2022-08-13 ENCOUNTER — Other Ambulatory Visit: Payer: Self-pay

## 2022-08-13 VITALS — BP 188/93 | HR 84 | Temp 98.1°F | Resp 16 | Ht 77.0 in | Wt 277.0 lb

## 2022-08-13 DIAGNOSIS — I330 Acute and subacute infective endocarditis: Secondary | ICD-10-CM | POA: Diagnosis not present

## 2022-08-13 NOTE — Progress Notes (Signed)
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   Encounter for preoperative dental examination 07/23/2022   Sepsis without acute organ dysfunction ([place]) 07/22/2022   HFrEF (heart failure with reduced ejection fraction) ([place]) 07/21/2022   Bloodstream infection due to central venous catheter 07/21/2022   Medication monitoring encounter 07/21/2022   Bacteremia due to Enterococcus 07/21/2022   Acute midline low back pain with right-sided sciatica 07/20/2022   ESRD (end stage renal disease) ([place]) 07/20/2022   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   Nonrheumatic aortic valve insufficiency 07/18/2022   Acute bacterial endocarditis 07/17/2022   Enterococcal bacteremia 07/16/2022   Catheter-related bloodstream infection 07/16/2022   Enterococcus faecalis infection 07/16/2022   Fever 07/15/2022   Unspecified protein-calorie malnutrition ([place]) 10/27/2019   Anemia, unspecified 10/26/2019   Acute kidney failure, unspecified ([place]) 10/21/2019   Coagulation defect, unspecified ([place]) 10/21/2019   Personal history of COVID-19 10/21/2019   Secondary hyperparathyroidism of renal origin ([place]) 10/21/2019   COVID-19 virus infection    Mass of soft tissue of face 09/23/2018   Onychomycosis due to dermatophyte 03/29/2018   Anemia, iron deficiency 07/18/2016   Anemia in chronic kidney disease 07/18/2016   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, stage 5 ([place]) 05/29/2016   Suspected sleep apnea 05/24/2015   Chronic combined systolic and diastolic CHF (congestive heart failure) ([place]) 03/01/2015   Nonischemic cardiomyopathy ([place])    Chest pain 01/09/2015   Asthma    Hyperlipidemia 03/31/2012   Obesity, Class I, BMI 30-34.9 03/21/2011   Primary hypertension 03/21/2011   Gastroesophageal reflux  disease 03/21/2011   Type 2 diabetes mellitus with diabetic nephropathy ([place]) 02/20/2011      Subjective:    Patient ID: Jeremy [name], male    DOB: [birth year], 41 y.o.   MRN: [id number]  Chief Complaint  Patient presents with   Hospitalization Follow-up    Endocarditis -     HPI:  Jeremy [name] is a 41 y.o. male esrd here for f/o e faecalis endocarditis  He is getting gent/vanc with dialysis; plan 6 weeks EOT 08/28/22 He hasn't seen dental/cts for planned valve replacement yet  He has no n/v/diarrhea/rash  He also haven't heard from IR to remove the tunneled picc which was placed into left chest along his permacath  We had decided to use vanc/gent which are given with his dialysis permacath rather than the picc and he was supposed to have picc removed  No back pain since hospital admission  No other complaint   Allergies  Allergen Reactions   Acyclovir And Related Hives   Imdur [Isosorbide Nitrate]     Headaches from nitrate      Outpatient Medications Prior to Visit  Medication Sig Dispense Refill   Accu-Chek Softclix Lancets lancets Use as directed to check blood sugar four times daily. 400 each 1   acetaminophen (TYLENOL) 500 MG tablet Take 1,000 mg by mouth every 6 (six) hours as needed for moderate pain.     amLODipine (NORVASC) 10 MG tablet Take 1 tablet (10 mg total) by mouth daily. 90 tablet 3   atorvastatin (LIPITOR) 80 MG tablet Take 1 tablet (80 mg total) by mouth daily. NEEDS FOLLOW UP APPOINTMENT FOR ANYMORE REFILLS (Patient taking differently: Take 80 mg by mouth daily.) 90 tablet 0   AURYXIA 1 GM  210 MG(Fe) tablet Take 420 mg by mouth 3 (three) times daily.     blood glucose meter kit and supplies KIT Dispense based on patient and insurance preference. Use up to four times daily as directed. (FOR ICD-9 250.00, 250.01). 1 each 0   Blood Glucose Monitoring Suppl (ACCU-CHEK GUIDE) w/Device KIT Use to test blood sugar 1 kit 0   Blood Pressure  Monitoring (FORA P20 BLOOD PRESSURE CUFF) MISC Check your blood pressure everyday in the morning. 1 each 0   carvedilol (COREG) 25 MG tablet Take 1.5 tablets (37.5 mg total) by mouth 2 (two) times daily with a meal. Need to schedule an appointment for further refills 270 tablet 0   cloNIDine (CATAPRES) 0.3 MG tablet Take 1 tablet (0.3 mg total) by mouth 2 (two) times daily. 60 tablet 0   Doxercalciferol (HECTOROL IV) Inject 1 Dose into the vein Every Tuesday,Thursday,and Saturday with dialysis.     gentamicin 150 mg in dextrose 5 % 50 mL Inject 150 mg into the vein Every Tuesday,Thursday,and Saturday with dialysis.     glipiZIDE (GLUCOTROL) 5 MG tablet Take 1 tablet (5 mg total) by mouth daily before breakfast. 90 tablet 1   glucose blood (ACCU-CHEK GUIDE) test strip USE TO CHECK BLOOD SUGAR 4 TIMES DAILY 400 strip 4   hydrALAZINE (APRESOLINE) 100 MG tablet Take 1 tablet (100 mg total) by mouth 3 (three) times daily. 180 tablet 5   ibuprofen (ADVIL) 200 MG tablet Take 400 mg by mouth every 6 (six) hours as needed for moderate pain.     Insulin Syringe-Needle U-100 (ULTICARE INSULIN SYRINGE) 30G X 1/2" 0.5 ML MISC USE 4 TIMES DAILY WITH INSULIN INJECTIONS. 100 each 3   lidocaine (LIDODERM) 5 % Place 1 patch onto the skin daily. Remove and Discard patch within 12 hours or as directed. 30 patch 0   methocarbamol (ROBAXIN) 500 MG tablet Take 1 tablet (500 mg total) by mouth 2 (two) times daily. 26 tablet 0   multivitamin (RENA-VIT) TABS tablet Take 1 tablet by mouth daily.     nitroGLYCERIN (NITROSTAT) 0.4 MG SL tablet PLACE 1 TABLET UNDER THE TONGUE EVERY 5 MINUTES AS NEEDED FOR CHEST PAIN. (Patient taking differently: Place 0.4 mg under the tongue every 5 (five) minutes as needed for chest pain.) 25 tablet 3   ondansetron (ZOFRAN) 4 MG tablet Take 1 tablet (4 mg total) by mouth daily as needed. 30 tablet 11   ondansetron (ZOFRAN-ODT) 4 MG disintegrating tablet Take 1 tablet (4 mg total) by mouth every 8  (eight) hours as needed for nausea or vomiting. 20 tablet 0   RENVELA 800 MG tablet Take 1,600 mg by mouth 3 (three) times daily with meals.     vancomycin (VANCOCIN) 1-5 GM/200ML-% SOLN Inject 200 mLs (1,000 mg total) into the vein Every Tuesday,Thursday,and Saturday with dialysis. 4000 mL    insulin aspart (NOVOLOG FLEXPEN) 100 UNIT/ML FlexPen Inject 15 Units into the skin 3 (three) times daily with meals. (Patient taking differently: Inject 17 Units into the skin 3 (three) times daily with meals.) 39 mL 3   insulin glargine (LANTUS) 100 unit/mL SOPN Inject 37 Units into the skin at bedtime. 33.3 mL 0   Insulin Pen Needle (PEN NEEDLES) 31G X 5 MM MISC use four times daily with insulin 300 each 2   No facility-administered medications prior to visit.     Social History   Socioeconomic History   Marital status: Single    Spouse name: Not on  file   Number of children: Not on file   Years of education: Not on file   Highest education level: Some college, no degree  Occupational History   Not on file  Tobacco Use   Smoking status: Never   Smokeless tobacco: Never  Vaping Use   Vaping Use: Never used  Substance and Sexual Activity   Alcohol use: No    Alcohol/week: 0.0 standard drinks of alcohol   Drug use: Yes    Types: Marijuana    Comment: last used 09/15/2020   Sexual activity: Yes    Partners: Female  Other Topics Concern   Not on file  Social History Narrative   Lives in [place] by himself.  Currently in [facility].   Caffeine- none       Social Determinants of Health   Financial Resource Strain: Not on file  Food Insecurity: No Food Insecurity (07/16/2022)   Hunger Vital Sign    Worried About Running Out of Food in the Last Year: Never true    Ran Out of Food in the Last Year: Never true  Transportation Needs: No Transportation Needs (07/16/2022)   PRAPARE - Hydrologist (Medical): No    Lack of Transportation (Non-Medical): No  Physical  Activity: Insufficiently Active (11/27/2021)   Exercise Vital Sign    Days of Exercise per Week: 2 days    Minutes of Exercise per Session: 30 min  Stress: Stress Concern Present (11/27/2021)   [place]    Feeling of Stress : To some extent  Social Connections: Not on file  Intimate Partner Violence: Not At Risk (07/16/2022)   Humiliation, Afraid, Rape, and Kick questionnaire    Fear of Current or Ex-Partner: No    Emotionally Abused: No    Physically Abused: No    Sexually Abused: No      Review of Systems    All other ros negative  Objective:    BP (!) 188/93   Pulse 84   Temp 98.1 F (36.7 C)   Resp 16   Ht _0  (1.956 m)   Wt 277 lb (125.6 kg)   SpO2 98%   BMI 32.85 kg/m  Nursing note and vital signs reviewed.  Physical Exam     General/constitutional: no distress, pleasant HEENT: Normocephalic, PER, Conj Clear, EOMI, Oropharynx clear Neck supple CV: rrr no mrg Lungs: clear to auscultation, normal respiratory effort Abd: Soft, Nontender Ext: no edema Skin: No Rash Neuro: nonfocal MSK: no peripheral joint swelling/tenderness/warmth; back spines nontender  Central line: left permatcath and picc site no purulence/erythema  Labs:  Micro:  Serology:  Imaging:  Assessment & Plan:   Problem List Items Addressed This Visit       Cardiovascular and Mediastinum   Acute bacterial endocarditis - Primary   Relevant Orders   IR Removal Tun Cv Cath W/O FL      No orders of the defined types were placed in this encounter.    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 Found to have    # E faecalis bacteremia/Native AV endocarditis Repeat blood cx 11/9 NG in 4 days  TEE 11/10 Borderline Severe AI. Concern for aortic valve leaflet perforation 2/2 IE. SVC thrombus  CTVS - plan for AVR once pre op work up completed with dental eval, CT coronary angiogram and s/p 2 weeks of abtx    Due to high risk line infection and no superiority of ceftriaxone/amp over vanc/gent will do the latter   Tunneled ir placed picc can be removed -- primary team discussed with ir and this is to be removed outpatient     # ESRD on HD # Lumbar degenerative disc disease with also concerns for early discitis and osteomyelitis in MRI    08/13/22 id assessment Planned 6 weeks vanc/gent eot 12/21 No abscess on lower back and assymptomatic today's visit no plan to repeat mri   -ir order placed to remove tunneled picc -send chart to dr Darcey Nora and dental Dr Benson [place] to follow up with patient for dental and valve surgery -advise patient to request id visit during surgery in hospital when that happens to decide if further abx needed based on valve surgery -otherwise f/u with me 4-6 weeks -hd center managing vanc/gent   Follow-up: Return in about 4 weeks (around 09/10/2022).   I have spent a total of 40 minutes of face-to-face and non-face-to-face time, excluding clinical staff time, preparing to see patient, ordering tests and/or medications, and provide counseling the patient    Jabier Mutton, [place] for [facility] 08/13/2022, 4:34 PM

## 2022-08-13 NOTE — Patient Instructions (Addendum)
Your dentist was Sandi Mariscal and your CT surgeon was dr Patterson Hammersmith. I will include the chart for their team to contact you to follow up   You should have 6 weeks of the antibiotics with your dialysis center until 08/28/2022  Once/when you have your valve surgery -- the tissue culture should be done and if depending on how it looks or if the culture is positive will decide if we need to treat you further with antibiotics after valve surgery  -the inpatient infectious disease team will need to see you to decide    In the mean time I have placed an order for the radiologist to remove the tunneled picc line. There are 2 lines there and make sure they confirm with you they'll remove the picc line, not the dialysis line

## 2022-08-14 DIAGNOSIS — D689 Coagulation defect, unspecified: Secondary | ICD-10-CM | POA: Diagnosis not present

## 2022-08-14 DIAGNOSIS — D631 Anemia in chronic kidney disease: Secondary | ICD-10-CM | POA: Diagnosis not present

## 2022-08-14 DIAGNOSIS — D509 Iron deficiency anemia, unspecified: Secondary | ICD-10-CM | POA: Diagnosis not present

## 2022-08-14 DIAGNOSIS — N2581 Secondary hyperparathyroidism of renal origin: Secondary | ICD-10-CM | POA: Diagnosis not present

## 2022-08-14 DIAGNOSIS — Z992 Dependence on renal dialysis: Secondary | ICD-10-CM | POA: Diagnosis not present

## 2022-08-14 DIAGNOSIS — N186 End stage renal disease: Secondary | ICD-10-CM | POA: Diagnosis not present

## 2022-08-14 DIAGNOSIS — R7881 Bacteremia: Secondary | ICD-10-CM | POA: Diagnosis not present

## 2022-08-15 ENCOUNTER — Other Ambulatory Visit: Payer: Self-pay | Admitting: Student

## 2022-08-15 ENCOUNTER — Encounter: Payer: Self-pay | Admitting: *Deleted

## 2022-08-15 ENCOUNTER — Other Ambulatory Visit (HOSPITAL_COMMUNITY): Payer: Self-pay | Admitting: Internal Medicine

## 2022-08-15 ENCOUNTER — Telehealth: Payer: Self-pay

## 2022-08-15 ENCOUNTER — Other Ambulatory Visit (HOSPITAL_COMMUNITY): Payer: Self-pay

## 2022-08-15 DIAGNOSIS — Z794 Long term (current) use of insulin: Secondary | ICD-10-CM

## 2022-08-15 DIAGNOSIS — E7849 Other hyperlipidemia: Secondary | ICD-10-CM

## 2022-08-15 MED ORDER — ATORVASTATIN CALCIUM 80 MG PO TABS
80.0000 mg | ORAL_TABLET | Freq: Every day | ORAL | 0 refills | Status: DC
  Filled 2022-08-15: qty 30, 30d supply, fill #0

## 2022-08-15 NOTE — Telephone Encounter (Signed)
Called and spoke with patient. Relayed message per Dr. Gale Journey, that his dental team and CT surgeon "have been trying to call him and he needs to answer/setup visits with them"  Patient stated he had not received any calls. Confirmed that his cell number is the best contact number for him [phone]).  Provided patient with phone number for Dr. Prescott Gum and told patient that I would send additional contact info via [place].   Patient verbalized understanding. All questions answered.  Called Dr. Lucianne Lei Trigt's office [phone]) and ensured front desk staff had best contact number for patient. Attempted to contact Dr. Orma Flaming office [phone]), but office closed for the day.  Binnie Kand, RN

## 2022-08-15 NOTE — Telephone Encounter (Signed)
-----   Message from Jabier Mutton, MD sent at 08/15/2022 11:29 AM EST ----- Hi team [facility] Could you let patient know that dental/cts have been trying to call him and he needs to answer/setup visits with them.  thanks ----- Message ----- From: Dahlia Byes, MD Sent: 08/15/2022  10:43 AM EST To: Jabier Mutton, MD   ----- Message ----- From: Laury Deep, RN Sent: 08/14/2022   3:04 PM EST To: Dahlia Byes, MD  Yes, he needs extractions and won't answer dentals calls.  He needs to get that done, then come back here for srgy eval.  Thanks,  Ryan ----- Message ----- From: Dahlia Byes, MD Sent: 08/14/2022   2:48 PM EST To: Laury Deep, RN  This is the patient who went home with endocarditis, bad teeth , needs AVR?? ----- Message ----- From: Jabier Mutton, MD Sent: 08/14/2022  10:52 AM EST To: Dahlia Byes, MD  He is getting the abx with dialysis  He said he doesn't know of any dental/cts appointment f/u and haven't heard either from either team.    ----- Message ----- From: Dahlia Byes, MD Sent: 08/14/2022   9:15 AM EST To: Jabier Mutton, MD  We are trying to followup with the patient but he has not kept any of his appointments. Is he being compliant with your antibiotic therapy? ----- Message ----- From: Jabier Mutton, MD Sent: 08/13/2022   4:49 PM EST To: Dahlia Byes, MD; Charlaine Dalton, DMD  Dear doctors. 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 He still haven't received CTS or dental follow up yet. He'll finish abx on 12/21. Please send valve culture during surgery and call ID team to see if he needs ongoing treatment. thanks

## 2022-08-18 ENCOUNTER — Ambulatory Visit (HOSPITAL_COMMUNITY)
Admission: RE | Admit: 2022-08-18 | Discharge: 2022-08-18 | Disposition: A | Discharge status: Home / Self Care | Payer: Medicaid Other | Source: Ambulatory Visit | Attending: Internal Medicine | Admitting: Internal Medicine

## 2022-08-18 DIAGNOSIS — Z452 Encounter for adjustment and management of vascular access device: Secondary | ICD-10-CM | POA: Insufficient documentation

## 2022-08-18 DIAGNOSIS — I330 Acute and subacute infective endocarditis: Secondary | ICD-10-CM | POA: Diagnosis not present

## 2022-08-18 HISTORY — PX: IR REMOVAL TUN CV CATH W/O FL: IMG2289

## 2022-08-18 NOTE — Procedures (Signed)
Interventional Radiology Procedure Note  Risks and benefits of LIJ tunneled central catheter removal were discussed with the patient including, but not limited to bleeding, hematoma, infection, damage to adjacent structures.  All of the patient's questions were answered, patient is agreeable to proceed. Consent signed and in chart. PROCEDURE SUMMARY:  Successful removal of tunneled central catheter  No complications.   EBL = trace  Please see full dictation in imaging section of Epic for procedure details.  Jacqualine Mau NP 08/18/2022 11:32 AM

## 2022-08-19 DIAGNOSIS — N186 End stage renal disease: Secondary | ICD-10-CM | POA: Diagnosis not present

## 2022-08-19 DIAGNOSIS — N2581 Secondary hyperparathyroidism of renal origin: Secondary | ICD-10-CM | POA: Diagnosis not present

## 2022-08-19 DIAGNOSIS — Z992 Dependence on renal dialysis: Secondary | ICD-10-CM | POA: Diagnosis not present

## 2022-08-19 DIAGNOSIS — D509 Iron deficiency anemia, unspecified: Secondary | ICD-10-CM | POA: Diagnosis not present

## 2022-08-19 DIAGNOSIS — R7881 Bacteremia: Secondary | ICD-10-CM | POA: Diagnosis not present

## 2022-08-19 DIAGNOSIS — D689 Coagulation defect, unspecified: Secondary | ICD-10-CM | POA: Diagnosis not present

## 2022-08-21 DIAGNOSIS — N2581 Secondary hyperparathyroidism of renal origin: Secondary | ICD-10-CM | POA: Diagnosis not present

## 2022-08-21 DIAGNOSIS — R7881 Bacteremia: Secondary | ICD-10-CM | POA: Diagnosis not present

## 2022-08-21 DIAGNOSIS — Z992 Dependence on renal dialysis: Secondary | ICD-10-CM | POA: Diagnosis not present

## 2022-08-21 DIAGNOSIS — N186 End stage renal disease: Secondary | ICD-10-CM | POA: Diagnosis not present

## 2022-08-21 DIAGNOSIS — D689 Coagulation defect, unspecified: Secondary | ICD-10-CM | POA: Diagnosis not present

## 2022-08-21 DIAGNOSIS — D509 Iron deficiency anemia, unspecified: Secondary | ICD-10-CM | POA: Diagnosis not present

## 2022-08-23 DIAGNOSIS — N186 End stage renal disease: Secondary | ICD-10-CM | POA: Diagnosis not present

## 2022-08-23 DIAGNOSIS — Z992 Dependence on renal dialysis: Secondary | ICD-10-CM | POA: Diagnosis not present

## 2022-08-23 DIAGNOSIS — D509 Iron deficiency anemia, unspecified: Secondary | ICD-10-CM | POA: Diagnosis not present

## 2022-08-23 DIAGNOSIS — N2581 Secondary hyperparathyroidism of renal origin: Secondary | ICD-10-CM | POA: Diagnosis not present

## 2022-08-23 DIAGNOSIS — D689 Coagulation defect, unspecified: Secondary | ICD-10-CM | POA: Diagnosis not present

## 2022-08-23 DIAGNOSIS — R7881 Bacteremia: Secondary | ICD-10-CM | POA: Diagnosis not present

## 2022-08-26 ENCOUNTER — Other Ambulatory Visit: Payer: Self-pay | Admitting: Student

## 2022-08-26 ENCOUNTER — Other Ambulatory Visit (HOSPITAL_COMMUNITY): Payer: Self-pay

## 2022-08-26 DIAGNOSIS — N2589 Other disorders resulting from impaired renal tubular function: Secondary | ICD-10-CM | POA: Diagnosis not present

## 2022-08-26 DIAGNOSIS — Z992 Dependence on renal dialysis: Secondary | ICD-10-CM | POA: Diagnosis not present

## 2022-08-26 DIAGNOSIS — D689 Coagulation defect, unspecified: Secondary | ICD-10-CM | POA: Diagnosis not present

## 2022-08-26 DIAGNOSIS — R17 Unspecified jaundice: Secondary | ICD-10-CM | POA: Diagnosis not present

## 2022-08-26 DIAGNOSIS — K7689 Other specified diseases of liver: Secondary | ICD-10-CM | POA: Diagnosis not present

## 2022-08-26 DIAGNOSIS — Z79899 Other long term (current) drug therapy: Secondary | ICD-10-CM | POA: Diagnosis not present

## 2022-08-26 DIAGNOSIS — Z794 Long term (current) use of insulin: Secondary | ICD-10-CM

## 2022-08-26 DIAGNOSIS — R82998 Other abnormal findings in urine: Secondary | ICD-10-CM | POA: Diagnosis not present

## 2022-08-26 DIAGNOSIS — E440 Moderate protein-calorie malnutrition: Secondary | ICD-10-CM | POA: Diagnosis not present

## 2022-08-26 DIAGNOSIS — R7881 Bacteremia: Secondary | ICD-10-CM | POA: Diagnosis not present

## 2022-08-26 DIAGNOSIS — N2581 Secondary hyperparathyroidism of renal origin: Secondary | ICD-10-CM | POA: Diagnosis not present

## 2022-08-26 DIAGNOSIS — D509 Iron deficiency anemia, unspecified: Secondary | ICD-10-CM | POA: Diagnosis not present

## 2022-08-26 DIAGNOSIS — N186 End stage renal disease: Secondary | ICD-10-CM | POA: Diagnosis not present

## 2022-08-26 DIAGNOSIS — D631 Anemia in chronic kidney disease: Secondary | ICD-10-CM | POA: Diagnosis not present

## 2022-08-26 MED ORDER — PEN NEEDLES 31G X 5 MM MISC
1.0000 | Freq: Four times a day (QID) | 2 refills | Status: DC
  Filled 2022-08-26: qty 100, 34d supply, fill #0
  Filled 2022-08-26 – 2022-12-26 (×2): qty 100, 25d supply, fill #0

## 2022-08-26 MED ORDER — INSULIN GLARGINE 100 UNITS/ML SOLOSTAR PEN
37.0000 [IU] | PEN_INJECTOR | Freq: Every day | SUBCUTANEOUS | 3 refills | Status: DC
  Filled 2022-08-26: qty 33, 89d supply, fill #0

## 2022-08-26 NOTE — Progress Notes (Signed)
Sent script for lantus and needles.

## 2022-08-27 ENCOUNTER — Other Ambulatory Visit (HOSPITAL_COMMUNITY): Payer: Self-pay

## 2022-08-28 DIAGNOSIS — K7689 Other specified diseases of liver: Secondary | ICD-10-CM | POA: Diagnosis not present

## 2022-08-28 DIAGNOSIS — D689 Coagulation defect, unspecified: Secondary | ICD-10-CM | POA: Diagnosis not present

## 2022-08-28 DIAGNOSIS — R17 Unspecified jaundice: Secondary | ICD-10-CM | POA: Diagnosis not present

## 2022-08-28 DIAGNOSIS — D631 Anemia in chronic kidney disease: Secondary | ICD-10-CM | POA: Diagnosis not present

## 2022-08-28 DIAGNOSIS — Z992 Dependence on renal dialysis: Secondary | ICD-10-CM | POA: Diagnosis not present

## 2022-08-28 DIAGNOSIS — N2589 Other disorders resulting from impaired renal tubular function: Secondary | ICD-10-CM | POA: Diagnosis not present

## 2022-08-28 DIAGNOSIS — R82998 Other abnormal findings in urine: Secondary | ICD-10-CM | POA: Diagnosis not present

## 2022-08-28 DIAGNOSIS — D509 Iron deficiency anemia, unspecified: Secondary | ICD-10-CM | POA: Diagnosis not present

## 2022-08-28 DIAGNOSIS — E440 Moderate protein-calorie malnutrition: Secondary | ICD-10-CM | POA: Diagnosis not present

## 2022-08-28 DIAGNOSIS — Z79899 Other long term (current) drug therapy: Secondary | ICD-10-CM | POA: Diagnosis not present

## 2022-08-28 DIAGNOSIS — R7881 Bacteremia: Secondary | ICD-10-CM | POA: Diagnosis not present

## 2022-08-28 DIAGNOSIS — N186 End stage renal disease: Secondary | ICD-10-CM | POA: Diagnosis not present

## 2022-08-28 DIAGNOSIS — N2581 Secondary hyperparathyroidism of renal origin: Secondary | ICD-10-CM | POA: Diagnosis not present

## 2022-08-30 DIAGNOSIS — R82998 Other abnormal findings in urine: Secondary | ICD-10-CM | POA: Diagnosis not present

## 2022-08-30 DIAGNOSIS — R7881 Bacteremia: Secondary | ICD-10-CM | POA: Diagnosis not present

## 2022-08-30 DIAGNOSIS — Z992 Dependence on renal dialysis: Secondary | ICD-10-CM | POA: Diagnosis not present

## 2022-08-30 DIAGNOSIS — N2589 Other disorders resulting from impaired renal tubular function: Secondary | ICD-10-CM | POA: Diagnosis not present

## 2022-08-30 DIAGNOSIS — D631 Anemia in chronic kidney disease: Secondary | ICD-10-CM | POA: Diagnosis not present

## 2022-08-30 DIAGNOSIS — R17 Unspecified jaundice: Secondary | ICD-10-CM | POA: Diagnosis not present

## 2022-08-30 DIAGNOSIS — D509 Iron deficiency anemia, unspecified: Secondary | ICD-10-CM | POA: Diagnosis not present

## 2022-08-30 DIAGNOSIS — Z79899 Other long term (current) drug therapy: Secondary | ICD-10-CM | POA: Diagnosis not present

## 2022-08-30 DIAGNOSIS — K7689 Other specified diseases of liver: Secondary | ICD-10-CM | POA: Diagnosis not present

## 2022-08-30 DIAGNOSIS — E440 Moderate protein-calorie malnutrition: Secondary | ICD-10-CM | POA: Diagnosis not present

## 2022-08-30 DIAGNOSIS — N2581 Secondary hyperparathyroidism of renal origin: Secondary | ICD-10-CM | POA: Diagnosis not present

## 2022-08-30 DIAGNOSIS — D689 Coagulation defect, unspecified: Secondary | ICD-10-CM | POA: Diagnosis not present

## 2022-08-30 DIAGNOSIS — N186 End stage renal disease: Secondary | ICD-10-CM | POA: Diagnosis not present

## 2022-09-02 DIAGNOSIS — Z992 Dependence on renal dialysis: Secondary | ICD-10-CM | POA: Diagnosis not present

## 2022-09-02 DIAGNOSIS — N2581 Secondary hyperparathyroidism of renal origin: Secondary | ICD-10-CM | POA: Diagnosis not present

## 2022-09-02 DIAGNOSIS — R7881 Bacteremia: Secondary | ICD-10-CM | POA: Diagnosis not present

## 2022-09-02 DIAGNOSIS — D689 Coagulation defect, unspecified: Secondary | ICD-10-CM | POA: Diagnosis not present

## 2022-09-02 DIAGNOSIS — N186 End stage renal disease: Secondary | ICD-10-CM | POA: Diagnosis not present

## 2022-09-02 DIAGNOSIS — D509 Iron deficiency anemia, unspecified: Secondary | ICD-10-CM | POA: Diagnosis not present

## 2022-09-04 ENCOUNTER — Other Ambulatory Visit (HOSPITAL_COMMUNITY): Payer: Self-pay | Admitting: Internal Medicine

## 2022-09-04 ENCOUNTER — Other Ambulatory Visit (HOSPITAL_COMMUNITY): Payer: Self-pay

## 2022-09-04 ENCOUNTER — Other Ambulatory Visit: Payer: Self-pay

## 2022-09-04 ENCOUNTER — Other Ambulatory Visit (HOSPITAL_COMMUNITY): Payer: Self-pay | Admitting: Cardiology

## 2022-09-04 ENCOUNTER — Encounter (HOSPITAL_COMMUNITY): Payer: Self-pay | Admitting: *Deleted

## 2022-09-04 DIAGNOSIS — D509 Iron deficiency anemia, unspecified: Secondary | ICD-10-CM | POA: Diagnosis not present

## 2022-09-04 DIAGNOSIS — Z992 Dependence on renal dialysis: Secondary | ICD-10-CM | POA: Diagnosis not present

## 2022-09-04 DIAGNOSIS — E7849 Other hyperlipidemia: Secondary | ICD-10-CM

## 2022-09-04 DIAGNOSIS — D689 Coagulation defect, unspecified: Secondary | ICD-10-CM | POA: Diagnosis not present

## 2022-09-04 DIAGNOSIS — N186 End stage renal disease: Secondary | ICD-10-CM | POA: Diagnosis not present

## 2022-09-04 DIAGNOSIS — R7881 Bacteremia: Secondary | ICD-10-CM | POA: Diagnosis not present

## 2022-09-04 DIAGNOSIS — N2581 Secondary hyperparathyroidism of renal origin: Secondary | ICD-10-CM | POA: Diagnosis not present

## 2022-09-04 MED ORDER — ATORVASTATIN CALCIUM 80 MG PO TABS
80.0000 mg | ORAL_TABLET | Freq: Every day | ORAL | 0 refills | Status: DC
  Filled 2022-09-04 – 2022-09-24 (×2): qty 30, 30d supply, fill #0

## 2022-09-04 MED ORDER — CLONIDINE HCL 0.3 MG PO TABS
0.3000 mg | ORAL_TABLET | Freq: Two times a day (BID) | ORAL | 0 refills | Status: DC
  Filled 2022-09-04: qty 60, 30d supply, fill #0

## 2022-09-04 MED ORDER — CARVEDILOL 25 MG PO TABS
37.5000 mg | ORAL_TABLET | Freq: Two times a day (BID) | ORAL | 0 refills | Status: DC
  Filled 2022-09-04 – 2022-11-07 (×2): qty 270, 90d supply, fill #0

## 2022-09-04 NOTE — Telephone Encounter (Signed)
Refill requested for clonidine Pt overdue for follow up Refills completed until follow up

## 2022-09-06 DIAGNOSIS — N2581 Secondary hyperparathyroidism of renal origin: Secondary | ICD-10-CM | POA: Diagnosis not present

## 2022-09-06 DIAGNOSIS — R7881 Bacteremia: Secondary | ICD-10-CM | POA: Diagnosis not present

## 2022-09-06 DIAGNOSIS — Z992 Dependence on renal dialysis: Secondary | ICD-10-CM | POA: Diagnosis not present

## 2022-09-06 DIAGNOSIS — N186 End stage renal disease: Secondary | ICD-10-CM | POA: Diagnosis not present

## 2022-09-06 DIAGNOSIS — D689 Coagulation defect, unspecified: Secondary | ICD-10-CM | POA: Diagnosis not present

## 2022-09-06 DIAGNOSIS — D509 Iron deficiency anemia, unspecified: Secondary | ICD-10-CM | POA: Diagnosis not present

## 2022-09-07 DIAGNOSIS — N186 End stage renal disease: Secondary | ICD-10-CM | POA: Diagnosis not present

## 2022-09-07 DIAGNOSIS — E1129 Type 2 diabetes mellitus with other diabetic kidney complication: Secondary | ICD-10-CM | POA: Diagnosis not present

## 2022-09-07 DIAGNOSIS — Z992 Dependence on renal dialysis: Secondary | ICD-10-CM | POA: Diagnosis not present

## 2022-09-09 DIAGNOSIS — R7881 Bacteremia: Secondary | ICD-10-CM | POA: Diagnosis not present

## 2022-09-09 DIAGNOSIS — Z992 Dependence on renal dialysis: Secondary | ICD-10-CM | POA: Diagnosis not present

## 2022-09-09 DIAGNOSIS — N186 End stage renal disease: Secondary | ICD-10-CM | POA: Diagnosis not present

## 2022-09-09 DIAGNOSIS — N2581 Secondary hyperparathyroidism of renal origin: Secondary | ICD-10-CM | POA: Diagnosis not present

## 2022-09-09 DIAGNOSIS — D689 Coagulation defect, unspecified: Secondary | ICD-10-CM | POA: Diagnosis not present

## 2022-09-10 ENCOUNTER — Ambulatory Visit: Payer: Medicaid Other | Admitting: Internal Medicine

## 2022-09-11 DIAGNOSIS — N2581 Secondary hyperparathyroidism of renal origin: Secondary | ICD-10-CM | POA: Diagnosis not present

## 2022-09-11 DIAGNOSIS — D689 Coagulation defect, unspecified: Secondary | ICD-10-CM | POA: Diagnosis not present

## 2022-09-11 DIAGNOSIS — Z992 Dependence on renal dialysis: Secondary | ICD-10-CM | POA: Diagnosis not present

## 2022-09-11 DIAGNOSIS — R7881 Bacteremia: Secondary | ICD-10-CM | POA: Diagnosis not present

## 2022-09-11 DIAGNOSIS — N186 End stage renal disease: Secondary | ICD-10-CM | POA: Diagnosis not present

## 2022-09-12 ENCOUNTER — Other Ambulatory Visit (HOSPITAL_COMMUNITY): Payer: Self-pay

## 2022-09-13 DIAGNOSIS — Z992 Dependence on renal dialysis: Secondary | ICD-10-CM | POA: Diagnosis not present

## 2022-09-13 DIAGNOSIS — N186 End stage renal disease: Secondary | ICD-10-CM | POA: Diagnosis not present

## 2022-09-13 DIAGNOSIS — R7881 Bacteremia: Secondary | ICD-10-CM | POA: Diagnosis not present

## 2022-09-13 DIAGNOSIS — N2581 Secondary hyperparathyroidism of renal origin: Secondary | ICD-10-CM | POA: Diagnosis not present

## 2022-09-13 DIAGNOSIS — D689 Coagulation defect, unspecified: Secondary | ICD-10-CM | POA: Diagnosis not present

## 2022-09-17 DIAGNOSIS — I871 Compression of vein: Secondary | ICD-10-CM | POA: Diagnosis not present

## 2022-09-17 DIAGNOSIS — T82898A Other specified complication of vascular prosthetic devices, implants and grafts, initial encounter: Secondary | ICD-10-CM | POA: Diagnosis not present

## 2022-09-17 DIAGNOSIS — N186 End stage renal disease: Secondary | ICD-10-CM | POA: Diagnosis not present

## 2022-09-17 DIAGNOSIS — Z992 Dependence on renal dialysis: Secondary | ICD-10-CM | POA: Diagnosis not present

## 2022-09-18 ENCOUNTER — Other Ambulatory Visit: Payer: Self-pay

## 2022-09-18 ENCOUNTER — Inpatient Hospital Stay (HOSPITAL_COMMUNITY)
Admission: EM | Admit: 2022-09-18 | Discharge: 2022-09-19 | DRG: 189 | Disposition: A | Discharge status: Home / Self Care | Payer: Medicaid Other | Attending: Internal Medicine | Admitting: Internal Medicine

## 2022-09-18 ENCOUNTER — Emergency Department (HOSPITAL_COMMUNITY): Payer: Medicaid Other

## 2022-09-18 DIAGNOSIS — J9601 Acute respiratory failure with hypoxia: Secondary | ICD-10-CM | POA: Diagnosis present

## 2022-09-18 DIAGNOSIS — Z833 Family history of diabetes mellitus: Secondary | ICD-10-CM

## 2022-09-18 DIAGNOSIS — I120 Hypertensive chronic kidney disease with stage 5 chronic kidney disease or end stage renal disease: Secondary | ICD-10-CM | POA: Diagnosis not present

## 2022-09-18 DIAGNOSIS — R0603 Acute respiratory distress: Secondary | ICD-10-CM | POA: Diagnosis not present

## 2022-09-18 DIAGNOSIS — Z91158 Patient's noncompliance with renal dialysis for other reason: Secondary | ICD-10-CM

## 2022-09-18 DIAGNOSIS — J45909 Unspecified asthma, uncomplicated: Secondary | ICD-10-CM | POA: Diagnosis present

## 2022-09-18 DIAGNOSIS — Z794 Long term (current) use of insulin: Secondary | ICD-10-CM

## 2022-09-18 DIAGNOSIS — R61 Generalized hyperhidrosis: Secondary | ICD-10-CM | POA: Diagnosis not present

## 2022-09-18 DIAGNOSIS — Z8249 Family history of ischemic heart disease and other diseases of the circulatory system: Secondary | ICD-10-CM | POA: Diagnosis not present

## 2022-09-18 DIAGNOSIS — J810 Acute pulmonary edema: Principal | ICD-10-CM | POA: Diagnosis present

## 2022-09-18 DIAGNOSIS — I169 Hypertensive crisis, unspecified: Secondary | ICD-10-CM | POA: Diagnosis present

## 2022-09-18 DIAGNOSIS — Z1152 Encounter for screening for COVID-19: Secondary | ICD-10-CM | POA: Diagnosis not present

## 2022-09-18 DIAGNOSIS — E785 Hyperlipidemia, unspecified: Secondary | ICD-10-CM | POA: Diagnosis present

## 2022-09-18 DIAGNOSIS — I428 Other cardiomyopathies: Secondary | ICD-10-CM | POA: Diagnosis not present

## 2022-09-18 DIAGNOSIS — Z634 Disappearance and death of family member: Secondary | ICD-10-CM | POA: Diagnosis not present

## 2022-09-18 DIAGNOSIS — E1122 Type 2 diabetes mellitus with diabetic chronic kidney disease: Secondary | ICD-10-CM | POA: Diagnosis present

## 2022-09-18 DIAGNOSIS — R7989 Other specified abnormal findings of blood chemistry: Secondary | ICD-10-CM

## 2022-09-18 DIAGNOSIS — Z7984 Long term (current) use of oral hypoglycemic drugs: Secondary | ICD-10-CM

## 2022-09-18 DIAGNOSIS — E874 Mixed disorder of acid-base balance: Secondary | ICD-10-CM | POA: Diagnosis not present

## 2022-09-18 DIAGNOSIS — D509 Iron deficiency anemia, unspecified: Secondary | ICD-10-CM | POA: Diagnosis not present

## 2022-09-18 DIAGNOSIS — R0602 Shortness of breath: Secondary | ICD-10-CM | POA: Diagnosis not present

## 2022-09-18 DIAGNOSIS — I161 Hypertensive emergency: Secondary | ICD-10-CM | POA: Diagnosis not present

## 2022-09-18 DIAGNOSIS — R4702 Dysphasia: Secondary | ICD-10-CM | POA: Diagnosis not present

## 2022-09-18 DIAGNOSIS — D631 Anemia in chronic kidney disease: Secondary | ICD-10-CM | POA: Diagnosis present

## 2022-09-18 DIAGNOSIS — Z992 Dependence on renal dialysis: Secondary | ICD-10-CM

## 2022-09-18 DIAGNOSIS — I2489 Other forms of acute ischemic heart disease: Secondary | ICD-10-CM | POA: Diagnosis present

## 2022-09-18 DIAGNOSIS — Z79899 Other long term (current) drug therapy: Secondary | ICD-10-CM | POA: Diagnosis not present

## 2022-09-18 DIAGNOSIS — R000 Tachycardia, unspecified: Secondary | ICD-10-CM | POA: Diagnosis not present

## 2022-09-18 DIAGNOSIS — N186 End stage renal disease: Secondary | ICD-10-CM | POA: Diagnosis present

## 2022-09-18 DIAGNOSIS — I5042 Chronic combined systolic (congestive) and diastolic (congestive) heart failure: Secondary | ICD-10-CM | POA: Diagnosis present

## 2022-09-18 DIAGNOSIS — I132 Hypertensive heart and chronic kidney disease with heart failure and with stage 5 chronic kidney disease, or end stage renal disease: Secondary | ICD-10-CM | POA: Diagnosis present

## 2022-09-18 DIAGNOSIS — I10 Essential (primary) hypertension: Secondary | ICD-10-CM | POA: Diagnosis not present

## 2022-09-18 DIAGNOSIS — F419 Anxiety disorder, unspecified: Secondary | ICD-10-CM | POA: Diagnosis not present

## 2022-09-18 DIAGNOSIS — E8779 Other fluid overload: Secondary | ICD-10-CM | POA: Diagnosis not present

## 2022-09-18 DIAGNOSIS — Z800 Family history of malignant neoplasm of digestive organs: Secondary | ICD-10-CM

## 2022-09-18 HISTORY — DX: Acute pulmonary edema: J81.0

## 2022-09-18 LAB — BLOOD GAS, VENOUS
Acid-Base Excess: 4.2 mmol/L — ABNORMAL HIGH (ref 0.0–2.0)
Bicarbonate: 27.3 mmol/L (ref 20.0–28.0)
Drawn by: 66167
O2 Saturation: 75.2 %
Patient temperature: 36.9
pCO2, Ven: 35 mmHg — ABNORMAL LOW (ref 44–60)
pH, Ven: 7.5 — ABNORMAL HIGH (ref 7.25–7.43)
pO2, Ven: 43 mmHg (ref 32–45)

## 2022-09-18 LAB — BASIC METABOLIC PANEL
Anion gap: 16 — ABNORMAL HIGH (ref 5–15)
BUN: 68 mg/dL — ABNORMAL HIGH (ref 6–20)
CO2: 20 mmol/L — ABNORMAL LOW (ref 22–32)
Calcium: 9.4 mg/dL (ref 8.9–10.3)
Chloride: 105 mmol/L (ref 98–111)
Creatinine, Ser: 10.96 mg/dL — ABNORMAL HIGH (ref 0.61–1.24)
GFR, Estimated: 5 mL/min — ABNORMAL LOW (ref >60)
Glucose, Bld: 257 mg/dL — ABNORMAL HIGH (ref 70–99)
Potassium: 4.4 mmol/L (ref 3.5–5.1)
Sodium: 141 mmol/L (ref 135–145)

## 2022-09-18 LAB — I-STAT VENOUS BLOOD GAS, ED
Acid-base deficit: 5 mmol/L — ABNORMAL HIGH (ref 0.0–2.0)
Bicarbonate: 18.3 mmol/L — ABNORMAL LOW (ref 20.0–28.0)
Calcium, Ion: 1.14 mmol/L — ABNORMAL LOW (ref 1.15–1.40)
HCT: 38 % — ABNORMAL LOW (ref 39.0–52.0)
Hemoglobin: 12.9 g/dL — ABNORMAL LOW (ref 13.0–17.0)
O2 Saturation: 77 %
Potassium: 4.4 mmol/L (ref 3.5–5.1)
Sodium: 142 mmol/L (ref 135–145)
TCO2: 19 mmol/L — ABNORMAL LOW (ref 22–32)
pCO2, Ven: 27.5 mmHg — ABNORMAL LOW (ref 44–60)
pH, Ven: 7.432 — ABNORMAL HIGH (ref 7.25–7.43)
pO2, Ven: 39 mmHg (ref 32–45)

## 2022-09-18 LAB — CBC
HCT: 36.3 % — ABNORMAL LOW (ref 39.0–52.0)
Hemoglobin: 11.5 g/dL — ABNORMAL LOW (ref 13.0–17.0)
MCH: 27.8 pg (ref 26.0–34.0)
MCHC: 31.7 g/dL (ref 30.0–36.0)
MCV: 87.7 fL (ref 80.0–100.0)
Platelets: 329 10*3/uL (ref 150–400)
RBC: 4.14 MIL/uL — ABNORMAL LOW (ref 4.22–5.81)
RDW: 17.2 % — ABNORMAL HIGH (ref 11.5–15.5)
WBC: 13.9 10*3/uL — ABNORMAL HIGH (ref 4.0–10.5)
nRBC: 0 % (ref 0.0–0.2)

## 2022-09-18 LAB — I-STAT CHEM 8, ED
BUN: 62 mg/dL — ABNORMAL HIGH (ref 6–20)
Calcium, Ion: 1.26 mmol/L (ref 1.15–1.40)
Chloride: 110 mmol/L (ref 98–111)
Creatinine, Ser: 12.5 mg/dL — ABNORMAL HIGH (ref 0.61–1.24)
Glucose, Bld: 263 mg/dL — ABNORMAL HIGH (ref 70–99)
HCT: 40 % (ref 39.0–52.0)
Hemoglobin: 13.6 g/dL (ref 13.0–17.0)
Potassium: 4.4 mmol/L (ref 3.5–5.1)
Sodium: 144 mmol/L (ref 135–145)
TCO2: 22 mmol/L (ref 22–32)

## 2022-09-18 LAB — LIPID PANEL
Cholesterol: 117 mg/dL (ref 0–200)
HDL: 30 mg/dL — ABNORMAL LOW (ref >40)
LDL Cholesterol: 66 mg/dL (ref 0–99)
Total CHOL/HDL Ratio: 3.9 RATIO
Triglycerides: 104 mg/dL (ref <150)
VLDL: 21 mg/dL (ref 0–40)

## 2022-09-18 LAB — BRAIN NATRIURETIC PEPTIDE: B Natriuretic Peptide: 1173.5 pg/mL — ABNORMAL HIGH (ref 0.0–100.0)

## 2022-09-18 LAB — GLUCOSE, CAPILLARY
Glucose-Capillary: 142 mg/dL — ABNORMAL HIGH (ref 70–99)
Glucose-Capillary: 205 mg/dL — ABNORMAL HIGH (ref 70–99)

## 2022-09-18 LAB — CBC WITH DIFFERENTIAL/PLATELET
Abs Immature Granulocytes: 0.02 10*3/uL (ref 0.00–0.07)
Basophils Absolute: 0.1 10*3/uL (ref 0.0–0.1)
Basophils Relative: 1 %
Eosinophils Absolute: 0.2 10*3/uL (ref 0.0–0.5)
Eosinophils Relative: 2 %
HCT: 40.5 % (ref 39.0–52.0)
Hemoglobin: 11.9 g/dL — ABNORMAL LOW (ref 13.0–17.0)
Immature Granulocytes: 0 %
Lymphocytes Relative: 17 %
Lymphs Abs: 1.7 10*3/uL (ref 0.7–4.0)
MCH: 27.2 pg (ref 26.0–34.0)
MCHC: 29.4 g/dL — ABNORMAL LOW (ref 30.0–36.0)
MCV: 92.7 fL (ref 80.0–100.0)
Monocytes Absolute: 0.6 10*3/uL (ref 0.1–1.0)
Monocytes Relative: 6 %
Neutro Abs: 7.2 10*3/uL (ref 1.7–7.7)
Neutrophils Relative %: 74 %
Platelets: 370 10*3/uL (ref 150–400)
RBC: 4.37 MIL/uL (ref 4.22–5.81)
RDW: 17.5 % — ABNORMAL HIGH (ref 11.5–15.5)
WBC: 9.8 10*3/uL (ref 4.0–10.5)
nRBC: 0 % (ref 0.0–0.2)

## 2022-09-18 LAB — TROPONIN I (HIGH SENSITIVITY)
Troponin I (High Sensitivity): 168 ng/L (ref <18)
Troponin I (High Sensitivity): 196 ng/L (ref <18)

## 2022-09-18 LAB — RENAL FUNCTION PANEL
Albumin: 3.6 g/dL (ref 3.5–5.0)
Anion gap: 13 (ref 5–15)
BUN: 65 mg/dL — ABNORMAL HIGH (ref 6–20)
CO2: 21 mmol/L — ABNORMAL LOW (ref 22–32)
Calcium: 9.5 mg/dL (ref 8.9–10.3)
Chloride: 106 mmol/L (ref 98–111)
Creatinine, Ser: 11.13 mg/dL — ABNORMAL HIGH (ref 0.61–1.24)
GFR, Estimated: 5 mL/min — ABNORMAL LOW (ref >60)
Glucose, Bld: 222 mg/dL — ABNORMAL HIGH (ref 70–99)
Phosphorus: 4.3 mg/dL (ref 2.5–4.6)
Potassium: 4.3 mmol/L (ref 3.5–5.1)
Sodium: 140 mmol/L (ref 135–145)

## 2022-09-18 LAB — TSH: TSH: 2.39 u[IU]/mL (ref 0.350–4.500)

## 2022-09-18 LAB — LACTIC ACID, PLASMA: Lactic Acid, Venous: 1.1 mmol/L (ref 0.5–1.9)

## 2022-09-18 LAB — RESP PANEL BY RT-PCR (RSV, FLU A&B, COVID)  RVPGX2
Influenza A by PCR: NEGATIVE
Influenza B by PCR: NEGATIVE
Resp Syncytial Virus by PCR: NEGATIVE
SARS Coronavirus 2 by RT PCR: NEGATIVE

## 2022-09-18 LAB — MAGNESIUM: Magnesium: 1.7 mg/dL (ref 1.7–2.4)

## 2022-09-18 LAB — MRSA NEXT GEN BY PCR, NASAL: MRSA by PCR Next Gen: NOT DETECTED

## 2022-09-18 LAB — CBG MONITORING, ED: Glucose-Capillary: 153 mg/dL — ABNORMAL HIGH (ref 70–99)

## 2022-09-18 LAB — PHOSPHORUS: Phosphorus: 3.6 mg/dL (ref 2.5–4.6)

## 2022-09-18 MED ORDER — NITROGLYCERIN IN D5W 200-5 MCG/ML-% IV SOLN
0.0000 ug/min | INTRAVENOUS | Status: DC
  Administered 2022-09-18: 5 ug/min via INTRAVENOUS
  Filled 2022-09-18: qty 250

## 2022-09-18 MED ORDER — GLIPIZIDE 5 MG PO TABS
5.0000 mg | ORAL_TABLET | Freq: Every day | ORAL | Status: DC
  Administered 2022-09-19: 5 mg via ORAL
  Filled 2022-09-18 (×2): qty 1

## 2022-09-18 MED ORDER — INSULIN ASPART 100 UNIT/ML IJ SOLN: 0.0000 [IU] | Freq: Three times a day (TID) | INTRAMUSCULAR | Status: DC

## 2022-09-18 MED ORDER — INSULIN ASPART 100 UNIT/ML IJ SOLN: 10.0000 [IU] | Freq: Three times a day (TID) | INTRAMUSCULAR | Status: DC

## 2022-09-18 MED ORDER — HYDRALAZINE HCL 20 MG/ML IJ SOLN: 2.0000 mg | Freq: Once | INTRAMUSCULAR | Status: DC

## 2022-09-18 MED ORDER — ASPIRIN 81 MG PO CHEW: 324.0000 mg | CHEWABLE_TABLET | Freq: Once | ORAL | Status: DC

## 2022-09-18 MED ORDER — ATORVASTATIN CALCIUM 80 MG PO TABS
80.0000 mg | ORAL_TABLET | Freq: Every day | ORAL | Status: DC
  Administered 2022-09-18 – 2022-09-19 (×2): 80 mg via ORAL
  Filled 2022-09-18: qty 2
  Filled 2022-09-18: qty 1

## 2022-09-18 MED ORDER — HEPARIN SODIUM (PORCINE) 5000 UNIT/ML IJ SOLN
5000.0000 [IU] | Freq: Three times a day (TID) | INTRAMUSCULAR | Status: DC
  Administered 2022-09-18 – 2022-09-19 (×3): 5000 [IU] via SUBCUTANEOUS
  Filled 2022-09-18 (×3): qty 1

## 2022-09-18 MED ORDER — INSULIN GLARGINE-YFGN 100 UNIT/ML ~~LOC~~ SOLN
37.0000 [IU] | Freq: Every day | SUBCUTANEOUS | Status: DC
  Administered 2022-09-18: 20 [IU] via SUBCUTANEOUS
  Filled 2022-09-18 (×2): qty 0.37

## 2022-09-18 MED ORDER — FERRIC CITRATE 1 GM 210 MG(FE) PO TABS
420.0000 mg | ORAL_TABLET | Freq: Three times a day (TID) | ORAL | Status: DC
  Administered 2022-09-18 – 2022-09-19 (×4): 420 mg via ORAL
  Filled 2022-09-18 (×6): qty 2

## 2022-09-18 MED ORDER — NITROGLYCERIN 2 % TD OINT
1.0000 [in_us] | TOPICAL_OINTMENT | Freq: Once | TRANSDERMAL | Status: AC
  Administered 2022-09-18: 1 [in_us] via TOPICAL
  Filled 2022-09-18: qty 1

## 2022-09-18 MED ORDER — ACETAMINOPHEN 325 MG PO TABS
650.0000 mg | ORAL_TABLET | Freq: Four times a day (QID) | ORAL | Status: DC | PRN
  Administered 2022-09-18: 650 mg via ORAL
  Filled 2022-09-18: qty 2

## 2022-09-18 MED ORDER — ONDANSETRON HCL 4 MG/2ML IJ SOLN: 4.0000 mg | Freq: Four times a day (QID) | INTRAMUSCULAR | Status: DC | PRN

## 2022-09-18 MED ORDER — HYDRALAZINE HCL 50 MG PO TABS
100.0000 mg | ORAL_TABLET | Freq: Three times a day (TID) | ORAL | Status: DC
  Administered 2022-09-18 – 2022-09-19 (×4): 100 mg via ORAL
  Filled 2022-09-18 (×4): qty 2

## 2022-09-18 MED ORDER — AMLODIPINE BESYLATE 10 MG PO TABS
10.0000 mg | ORAL_TABLET | Freq: Every day | ORAL | Status: DC
  Administered 2022-09-18: 10 mg via ORAL
  Filled 2022-09-18: qty 1

## 2022-09-18 MED ORDER — FUROSEMIDE 10 MG/ML IJ SOLN
80.0000 mg | Freq: Once | INTRAMUSCULAR | Status: AC
  Administered 2022-09-18: 80 mg via INTRAVENOUS
  Filled 2022-09-18: qty 8

## 2022-09-18 MED ORDER — CARVEDILOL 25 MG PO TABS
37.5000 mg | ORAL_TABLET | Freq: Two times a day (BID) | ORAL | Status: DC
  Administered 2022-09-18 – 2022-09-19 (×3): 37.5 mg via ORAL
  Filled 2022-09-18: qty 1
  Filled 2022-09-18: qty 3
  Filled 2022-09-18: qty 1

## 2022-09-18 MED ORDER — METHOCARBAMOL 500 MG PO TABS
500.0000 mg | ORAL_TABLET | Freq: Two times a day (BID) | ORAL | Status: DC
  Administered 2022-09-18 – 2022-09-19 (×3): 500 mg via ORAL
  Filled 2022-09-18 (×3): qty 1

## 2022-09-18 MED ORDER — CLONIDINE HCL 0.1 MG PO TABS
0.3000 mg | ORAL_TABLET | Freq: Two times a day (BID) | ORAL | Status: DC
  Administered 2022-09-18 – 2022-09-19 (×3): 0.3 mg via ORAL
  Filled 2022-09-18: qty 3
  Filled 2022-09-18: qty 1
  Filled 2022-09-18: qty 3

## 2022-09-18 MED ORDER — HEPARIN SODIUM (PORCINE) 1000 UNIT/ML IJ SOLN
INTRAMUSCULAR | Status: AC
  Filled 2022-09-18: qty 5

## 2022-09-18 MED ORDER — HALOPERIDOL LACTATE 5 MG/ML IJ SOLN
1.0000 mg | Freq: Once | INTRAMUSCULAR | Status: AC
  Administered 2022-09-18: 1 mg via INTRAVENOUS
  Filled 2022-09-18: qty 1

## 2022-09-18 MED ORDER — AMLODIPINE BESYLATE 10 MG PO TABS: 10.0000 mg | ORAL_TABLET | Freq: Once | ORAL | Status: DC

## 2022-09-18 MED ORDER — CHLORHEXIDINE GLUCONATE CLOTH 2 % EX PADS
6.0000 | MEDICATED_PAD | Freq: Every day | CUTANEOUS | Status: DC
  Administered 2022-09-18 – 2022-09-19 (×2): 6 via TOPICAL

## 2022-09-18 MED ORDER — ONDANSETRON HCL 4 MG PO TABS
4.0000 mg | ORAL_TABLET | Freq: Four times a day (QID) | ORAL | Status: DC | PRN
  Administered 2022-09-18: 4 mg via ORAL
  Filled 2022-09-18: qty 1

## 2022-09-18 NOTE — ED Notes (Signed)
Lab results was given to Nurse. 

## 2022-09-18 NOTE — Procedures (Signed)
   I was present at this dialysis session, have reviewed the session itself and made  appropriate changes Kelly Splinter MD [place] pager [phone]   09/18/2022, 11:42 AM

## 2022-09-18 NOTE — ED Notes (Signed)
Holding insulin until pt is ready to eat lunch tray

## 2022-09-18 NOTE — H&P (Signed)
Date: 09/18/2022               Patient Name:  Jeremy [name] MRN: [id number]  DOB: Jan 08, 1981 Age / Sex: 42 y.o., male   PCP: Rick Duff, MD         Medical Service: Internal Medicine Teaching Service         Attending Physician: Dr. Charise Killian, MD    First Contact: Dr. Drucie Opitz, MD Pager: [phone]  Second Contact: Dr. Christiana Fuchs, DO Pager: [phone]       After Hours (After 5p/  First Contact Pager: [phone]  weekends / holidays): Second Contact Pager: [phone]   Chief Complaint: SOB  History of Present Illness:  Mr. Printice Hellmer is a 42 year old male with past medical history of ESRD on TTS HD, bacteremia, aortic valve endocarditis, HFrecEF, HTN, HLD, insulin dependent T2DM presents to the ER with complaint of shortness of breath.  Patient reports he had dialysis line placed recently but missed his dialysis on Tuesday due to issues with his line. Patient reports that he woke up due to respiratory distress this morning.  Felt like he could not take deep breaths has some chest discomfort with taking deep breaths so he called EMS.  He did not complete HD on Tuesday due to issues with his line. He denies any chest pain at the moment, and has no nausea, vomiting, leg swelling, palpitations, headache, or dysuria.  ED course: S/p 2 doses of nitroglycerin by EMS. Found to have SBP in the 190s to 210s on arrival tachypneic and hypoxic.  Placed on BiPAP, and given IV Lasix 80 mg x 1 and Nitropaste.  Labs significant for BNP 1173, BUN/sCR 10.96/68, K+ 4.4, sodium 141, bicarb 20, troponin 168, WBC 9.8, Hgb 11.9.CXR showed evidence of pulmonary edema.  Nephrology was consulted for evaluation and IMTS was consulted for admission  Meds:  Current Meds  Medication Sig   acetaminophen (TYLENOL) 500 MG tablet Take 1,000 mg by mouth every 6 (six) hours as needed for moderate pain.   amLODipine (NORVASC) 10 MG tablet Take 1 tablet (10 mg total) by mouth daily.   atorvastatin  (LIPITOR) 80 MG tablet Take 1 tablet (80 mg total) by mouth daily. NEEDS FOLLOW UP APPOINTMENT FOR ANYMORE REFILLS   AURYXIA 1 GM 210 MG(Fe) tablet Take 420 mg by mouth 3 (three) times daily.   carvedilol (COREG) 25 MG tablet Take 1.5 tablets (37.5 mg total) by mouth 2 (two) times daily with a meal. Need to schedule an appointment for further refills   cloNIDine (CATAPRES) 0.3 MG tablet Take 1 tablet (0.3 mg total) by mouth 2 (two) times daily.   Doxercalciferol (HECTOROL IV) Inject 1 Dose into the vein Every Tuesday,Thursday,and Saturday with dialysis.   gentamicin 150 mg in dextrose 5 % 50 mL Inject 150 mg into the vein Every Tuesday,Thursday,and Saturday with dialysis.   glipiZIDE (GLUCOTROL) 5 MG tablet Take 1 tablet (5 mg total) by mouth daily before breakfast.   glucose blood (ACCU-CHEK GUIDE) test strip USE TO CHECK BLOOD SUGAR 4 TIMES DAILY   hydrALAZINE (APRESOLINE) 100 MG tablet Take 1 tablet (100 mg total) by mouth 3 (three) times daily.   insulin aspart (NOVOLOG FLEXPEN) 100 UNIT/ML FlexPen Inject 15 Units into the skin 3 (three) times daily with meals. (Patient taking differently: Inject 17 Units into the skin 3 (three) times daily with meals.)   insulin glargine (LANTUS) 100 unit/mL SOPN Inject 37 Units into the skin at  bedtime.   methocarbamol (ROBAXIN) 500 MG tablet Take 1 tablet (500 mg total) by mouth 2 (two) times daily.   multivitamin (RENA-VIT) TABS tablet Take 1 tablet by mouth daily.   nitroGLYCERIN (NITROSTAT) 0.4 MG SL tablet PLACE 1 TABLET UNDER THE TONGUE EVERY 5 MINUTES AS NEEDED FOR CHEST PAIN. (Patient taking differently: Place 0.4 mg under the tongue every 5 (five) minutes as needed for chest pain.)   ondansetron (ZOFRAN) 4 MG tablet Take 1 tablet (4 mg total) by mouth daily as needed.   RENVELA 800 MG tablet Take 800 mg by mouth daily.   vancomycin (VANCOCIN) 1-5 GM/200ML-% SOLN Inject 200 mLs (1,000 mg total) into the vein Every Tuesday,Thursday,and Saturday with  dialysis.    Allergies: Allergies as of 09/18/2022 - Review Complete 09/18/2022  Allergen Reaction Noted   Acyclovir and related Hives 11/28/2016   Imdur [isosorbide nitrate]  07/21/2022   Past Medical History:  Diagnosis Date   Abscess of left groin    Acute blood loss anemia 11/11/2013   Anemia in chronic kidney disease (CKD)    Asthma    Boil of scrotum 11/21/2015   Chest pain    a. 01/2015 Lexiscan MV: EF 28%, inferior, inferolateral, apical ischemia;  b. 01/2015 Cath: nl cors, PCWP 18 mmHg, CO 9.38 L/min, CI 3.53 L/min/m^2.   CHF (congestive heart failure) (HCC)    Depression    Situational   ESRD (end stage renal disease) ([place])    TTHS- [place]   Essential hypertension    Family history of adverse reaction to anesthesia    sister- "it was too much for her heart" died   GERD (gastroesophageal reflux disease)    Hyperlipidemia    Membranous glomerulonephritis    Morbid obesity ([place])    Nonischemic cardiomyopathy ([place])    a. 01/2015 Echo: EF 20-25%, diff HK, Gr 2 DD, Triv AI, mildly dil LA and Ao root.   PONV (postoperative nausea and vomiting)    Seizures ([place]) 02/29/2020   "electralytes were out of wack."   Type II diabetes mellitus ([place])    a. 01/2015 HbA1c = 8.9.    Family History: History of hypertension, cirrhosis and renal failure in father.  History of diabetes and stomach cancer in mother.  Social History:  Lives With: Girlfriend, has kids but they do not live with him Occupation: Landscaping Support: Family in the area Level of Function: Able to perform all ADLs/IADLs when not acutely ill PCP: Dr. Rick Duff, MD  Substances: Denies tobacco or alcohol but endorses smoking marijuana multiple times a week.  Review of Systems: A complete ROS was negative except as per HPI.   Physical Exam: Blood pressure (!) 215/97, pulse 99, resp. rate 17, height 6\' 5"  (1.956 m), weight 120.2 kg, SpO2 100 %.  General: Ill-appearing, obese middle-age man  sitting in bed on BiPAP. Mild respiratory distress. HEENT: [place]/AT. Moist mucous membrane.  Neck: JVD to the mid neck. Tunneled [place] on left neck/chest wall CV: Tachycardic. No murmurs, rubs, or gallops. No LE edema Pulmonary: On BiPAP. Mild increased WOB. Bibasilar rales up to the mid lung fields. No wheezing or rhonchi. Abdominal: Soft, nontender, nondistended. Normal bowel sounds. Extremities: Radial and DP pulses 2+ and symmetric. Skin: Warm and dry. No obvious rash or lesions. Neuro: A&Ox3. Moves all extremities. Normal sensation. No focal deficit. Psych: Anxious mood  EKG: personally reviewed my interpretation is sinus tach with left axis deviation  CXR: personally reviewed my interpretation is lower lung opacity  concerning for acute pulmonary edema.  Assessment & Plan by Problem: Principal Problem:   Flash pulmonary edema [facility])  Mr. Corion Sherrod is a 42 year old male with past medical history of ESRD on TTS HD, bacteremia, aortic valve endocarditis, HFrecEF, HTN, HLD, insulin dependent T2DM presented for evaluation of shortness of breath and found to have severe symptomatic hypertension and acute flash pulmonary edema.  #Severe symptomatic hypertension #Acute flash pulmonary edema Patient with a history of ESRD on HD TTS presented via EMS for worsening shortness of breath and pleuritic chest pain after not completing HD due to line issues during his last session. Patient found to be in respiratory distress requiring BiPAP therapy on admission. Found to have SBP >190 with labs significant for BNP >1000, and elevated troponin to 168. CXR shows evidence of pulmonary edema. Patient's presentation consistent with severe symptomatic hypertension and flash pulmonary edema. S/p nitro x 2 by EMS. No active chest pain at the moment.  S/p 1 dose of Lasix IV 80 mg without significant improvement in BP or urine output. SBP still in 200-220s. Will initiate nitro drip pending fluid removal via HD this  morning. -Start nitro drip, SBP goal 100-120 -Nephrology following, pending HD this morning -O2 supplementation as needed -Resume amlodipine, hydra and clonidine -Close BP monitoring  #Acute respiratory failure with hypoxia #Respiratory alkalosis #Metabolic acidosis Patient reported to EMS he woke up due to respiratory distress. Initially placed on nonrebreather mask without significant improvement patient unable to tolerate CPAP en route due to anxiety.  Placed on BiPAP on arrival. Patient's respiratory distress secondary to flash pulmonary edema.  VBG shows respiratory alkalosis likely compensating for his metabolic acidosis. Patient initially placed on BiPAP on admission. Mentation at baseline. 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 Per spouse, patient was only able to complete part of his HD session on Tuesday due to issue with his line. He is currently admitted for flash pulmonary edema. BUNs/creatinine 68/10.96 on admission but no clinical signs of uremia. No electrolyte abnormalities. -Nephrology following, HD this morning for fluid removal -Strict I&O's, renal diet -F/u lactic acid, mag, phos -Trend renal function panel -Nephrotoxic agents  #HFrecEF Patient with a history of EF 20-25% on TTE in 2016. Last TTE 07/2022 showed EF 50-55%, mild LVH, G2DD, moderate aortic regurg. Patient found to be hypoxic on admission.  BNP > 1000. Patient with JVD and crackles in the lung but no significant LE edema. S/p IV Lasix 80 mg x 1. Patient's respiratory distress likely driven by his acute flash pulmonary edema. -Reassess volume status after HD -Continue Coreg 37.5 mg twice daily -Tricked I&O's, daily weight -Check mag, phos  #T2DM Last A1c 8.2% 2 months ago. Home regimen includes Lantus 37 units at bedtime, NovoLog 17 units 3 times  daily with meals and glipizide 5 mg daily before breakfast.  Blood sugar 257 on BMP. -Semglee 37 units at bedtime -NovoLog 10 units TID with meals -Glipizide 5 mg daily -SSI with CBG monitoring  #Troponinemia Patient reported some pleuritic chest pain when EMS arrived was given to doses of sublingual nitro. Patient found to have troponin elevation to 168->196 on admission. He denies any current chest pain. EKG without any ischemic changes.  Elevated troponins secondary to demand ischemia in the setting of flash pulmonary edema. -Repeat troponin after HD  #Hx E faecalis  bacteremia #Hx native aortic valve endocarditis Patient admitted in early November 2023 for night sweats and subjective fevers and found to have E faecalis bacteremia as well as negative aortic valve endocarditis with presumed source from his tunneled HD catheter. Patient's HD catheter was replaced and he was discharged home with a regimen of vancomycin and gentamicin to be administered during HD days until 08/28/2022.  There was a concern for aortic valve leaflet perforation 2/2 endocarditis. -Pending aortic valve repair with CT surgery -Follow-up outpatient with ID  #Anemia of chronic disease #Iron deficiency anemia Hemoglobin improved from 9.2 one months ago to 11.9 on admission. -Continue Auryxia 420 mg 3 times daily -Trend CBC  #HLD No recent lipid panel on file. -Check lipid panel -Continue atorvastatin 80 mg daily  CODE STATUS: Full code DIET: Renal/carb modified PPx: Heparin  Dispo: Admit patient to Inpatient with expected length of stay greater than 2 midnights.  Signed: Lacinda Axon, MD 09/18/2022, 8:38 AM  Pager: [phone] Internal Medicine Teaching Service After 5pm on weekdays and 1pm on weekends: On Call pager: [phone]

## 2022-09-18 NOTE — Progress Notes (Addendum)
Received patient in bed to unit.  Alert and oriented.  Informed consent signed and in chart.   Treatment initiated: 0909 Treatment completed: 1235  Patient tolerated well. Pt was able to come off of bi-pap and is now on high flow n/c Transported back to the room  Alert, without acute distress.  Hand-off given to patient's nurse.   Access used: catheter Access issues: none  Total UF removed: 3.5L Medication(s) given: none Post HD VS: 214/95, 98100,98%16 Post HD weight: Unable to weigh [facility]

## 2022-09-18 NOTE — ED Notes (Signed)
ED TO INPATIENT HANDOFF REPORT  ED Nurse Name and Phone #:  Heath Lark [phone]  S Name/Age/Gender Jeremy [name] 42 y.o. male Room/Bed: 013C/013C  Code Status   Code Status: Full Code  Home/SNF/Other Home Patient oriented to: self, place, time, and situation Is this baseline? Yes   Triage Complete: Triage complete  Chief Complaint Flash pulmonary edema ([place]) [J81.0]  Triage Note No notes on file   Allergies Allergies  Allergen Reactions   Acyclovir And Related Hives   Imdur [Isosorbide Nitrate]     Headaches from nitrate    Level of Care/Admitting Diagnosis ED Disposition     ED Disposition  Admit   Condition  --   [facility]: [place] [[id number]]  Level of Care: Progressive [102]  Admit to Progressive based on following criteria: NEPHROLOGY stable condition requiring close monitoring for AKI, requiring Hemodialysis or Peritoneal Dialysis either from expected electrolyte imbalance, acidosis, or fluid overload that can be managed by NIPPV or high flow oxygen.  Admit to Progressive based on following criteria: RESPIRATORY PROBLEMS hypoxemic/hypercapnic respiratory failure that is responsive to NIPPV (BiPAP) or High Flow Nasal Cannula (6-80 lpm). Frequent assessment/intervention, no > Q2 hrs < Q4 hrs, to maintain oxygenation and pulmonary hygiene.  May admit patient to [facility] or [facility] if equivalent level of care is available:: No  Covid Evaluation: Asymptomatic - no recent exposure (last 10 days) testing not required  Diagnosis: Flash pulmonary edema [facility]) [[id number]]  Admitting Physician: Charise Killian [[id number]]  Attending Physician: Charise Killian [[id number]]  Certification:: I certify this patient will need inpatient services for at least 2 midnights  Estimated Length of Stay: 3          B Medical/Surgery History Past Medical History:  Diagnosis Date   Abscess of left groin    Acute blood loss anemia 11/11/2013    Anemia in chronic kidney disease (CKD)    Asthma    Boil of scrotum 11/21/2015   Chest pain    a. 01/2015 Lexiscan MV: EF 28%, inferior, inferolateral, apical ischemia;  b. 01/2015 Cath: nl cors, PCWP 18 mmHg, CO 9.38 L/min, CI 3.53 L/min/m^2.   CHF (congestive heart failure) (HCC)    Depression    Situational   ESRD (end stage renal disease) ([place])    TTHS- [place]   Essential hypertension    Family history of adverse reaction to anesthesia    sister- "it was too much for her heart" died   GERD (gastroesophageal reflux disease)    Hyperlipidemia    Membranous glomerulonephritis    Morbid obesity ([place])    Nonischemic cardiomyopathy ([place])    a. 01/2015 Echo: EF 20-25%, diff HK, Gr 2 DD, Triv AI, mildly dil LA and Ao root.   PONV (postoperative nausea and vomiting)    Seizures ([place]) 02/29/2020   "electralytes were out of wack."   Type II diabetes mellitus ([place])    a. 01/2015 HbA1c = 8.9.   Past Surgical History:  Procedure Laterality Date   A/V FISTULAGRAM N/A 08/12/2021   Procedure: A/V FISTULAGRAM;  Surgeon: Waynetta Sandy, MD;  Location: [place] [facility];  Service: Cardiovascular;  Laterality: N/A;   AV FISTULA PLACEMENT Right 12/18/2020   Procedure: RIGHT ARM RADIOCEPHALIC  ARTERIOVENOUS (AV) FISTULA CREATION;  Surgeon: Waynetta Sandy, MD;  Location: [place];  Service: Vascular;  Laterality: Right;   CARDIAC CATHETERIZATION N/A 02/01/2015   Procedure: Right/Left Heart Cath and Coronary Angiography;  Surgeon: Collier Salina  Tommy Rainwater, MD;  Location: [place] [facility];  Service: Cardiovascular;  Laterality: N/A;   IR FLUORO GUIDE CV LINE LEFT  07/21/2022   IR FLUORO GUIDE CV LINE LEFT  07/21/2022   IR FLUORO GUIDE CV LINE RIGHT  10/11/2019   IR REMOVAL TUN CV CATH W/O FL  07/17/2022   IR REMOVAL TUN CV CATH W/O FL  08/18/2022   IR US GUIDE VASC ACCESS LEFT  07/21/2022   IR US GUIDE VASC ACCESS LEFT  07/21/2022   IR US GUIDE VASC ACCESS RIGHT  10/11/2019   TEE WITHOUT  CARDIOVERSION N/A 07/18/2022   Procedure: TRANSESOPHAGEAL ECHOCARDIOGRAM (TEE);  Surgeon: Janina Mayo, MD;  Location: [place];  Service: Cardiovascular;  Laterality: N/A;   THORACOTOMY Left 11/08/2013   Procedure: LEFT THORACOTOMY;  Surgeon: Ivin Poot, MD;  Location: [facility] OR;  Service: Thoracic;  Laterality: Left;     A IV Location/Drains/Wounds Patient Lines/Drains/Airways Status     Active Line/Drains/Airways     Name Placement date Placement time Site Days   Peripheral IV 07/02/21 20 G Left;Posterior Hand 07/02/21  1415  Hand  443   Peripheral IV 07/02/21 20 G Left;Upper Forearm 07/02/21  2002  Forearm  443   Peripheral IV 07/21/22 20 G Left Antecubital 07/21/22  1159  Antecubital  59   Peripheral IV 09/18/22 20 G Left;Posterior Hand 09/18/22  --  Hand  less than 1   Fistula / Graft Right Forearm Arteriovenous fistula 12/18/20  0849  Forearm  639   Hemodialysis Catheter Left Internal jugular Double lumen Permanent (Tunneled) 07/21/22  1404  Internal jugular  59   Incision (Closed) 11/08/13 Neck Left 11/08/13  1236  -- 3236   Incision (Closed) 12/18/20 Arm Right 12/18/20  0847  -- 639            Intake/Output Last 24 hours No intake or output data in the 24 hours ending 09/18/22 1607  Labs/Imaging Results for orders placed or performed during the hospital encounter of 09/18/22 (from the past 48 hour(s))  CBC with Differential     Status: Abnormal   Collection Time: 09/18/22  5:51 AM  Result Value Ref Range   WBC 9.8 4.0 - 10.5 K/uL   RBC 4.37 4.22 - 5.81 MIL/uL   Hemoglobin 11.9 (L) 13.0 - 17.0 g/dL   HCT 40.5 39.0 - 52.0 %   MCV 92.7 80.0 - 100.0 fL   MCH 27.2 26.0 - 34.0 pg   MCHC 29.4 (L) 30.0 - 36.0 g/dL   RDW 17.5 (H) 11.5 - 15.5 %   Platelets 370 150 - 400 K/uL   nRBC 0.0 0.0 - 0.2 %   Neutrophils Relative % 74 %   Neutro Abs 7.2 1.7 - 7.7 K/uL   Lymphocytes Relative 17 %   Lymphs Abs 1.7 0.7 - 4.0 K/uL   Monocytes Relative 6 %   Monocytes Absolute  0.6 0.1 - 1.0 K/uL   Eosinophils Relative 2 %   Eosinophils Absolute 0.2 0.0 - 0.5 K/uL   Basophils Relative 1 %   Basophils Absolute 0.1 0.0 - 0.1 K/uL   Immature Granulocytes 0 %   Abs Immature Granulocytes 0.02 0.00 - 0.07 K/uL    Comment: Performed at [facility], [place] [address] [place], [place] [address]  Brain natriuretic peptide     Status: Abnormal   Collection Time: 09/18/22  5:51 AM  Result Value Ref Range   B Natriuretic Peptide 1,173.5 (H) 0.0 -  100.0 pg/mL    Comment: Performed at [facility], [place] [address] [place], [place] [address]  Troponin I (High Sensitivity)     Status: Abnormal   Collection Time: 09/18/22  5:51 AM  Result Value Ref Range   Troponin I (High Sensitivity) 168 (HH) <18 ng/L    Comment: CRITICAL RESULT CALLED TO, READ BACK BY AND VERIFIED WITH B.BECK,RN. 1093 09/18/22. LPAIT (NOTE) Elevated high sensitivity troponin I (hsTnI) values and significant  changes across serial measurements may suggest ACS but many other  chronic and acute conditions are known to elevate hsTnI results.  Refer to the "Links" section for chest pain algorithms and additional  guidance. Performed at [facility], [place] [address], [place], [place] [address]   Basic metabolic panel     Status: Abnormal   Collection Time: 09/18/22  6:06 AM  Result Value Ref Range   Sodium 141 135 - 145 mmol/L   Potassium 4.4 3.5 - 5.1 mmol/L   Chloride 105 98 - 111 mmol/L   CO2 20 (L) 22 - 32 mmol/L   Glucose, Bld 257 (H) 70 - 99 mg/dL    Comment: Glucose reference range applies only to samples taken after fasting for at least 8 hours.   BUN 68 (H) 6 - 20 mg/dL   Creatinine, Ser 10.96 (H) 0.61 - 1.24 mg/dL   Calcium 9.4 8.9 - 10.3 mg/dL   GFR, Estimated 5 (L) >60 mL/min    Comment: (NOTE) Calculated using the CKD-EPI Creatinine Equation (2021)    Anion gap 16 (H) 5 - 15    Comment: Performed at [facility] [address] [place], [place] [address]  Resp  panel by RT-PCR (RSV, Flu A&B, Covid) Anterior Nasal Swab     Status: None   Collection Time: 09/18/22  6:21 AM   Specimen: Anterior Nasal Swab  Result Value Ref Range   SARS Coronavirus 2 by RT PCR NEGATIVE NEGATIVE    Comment: (NOTE) SARS-CoV-2 target nucleic acids are NOT DETECTED.  The SARS-CoV-2 RNA is generally detectable in upper respiratory specimens during the acute phase of infection. The lowest concentration of SARS-CoV-2 viral copies this assay can detect is 138 copies/mL. A negative result does not preclude SARS-Cov-2 infection and should not be used as the sole basis for treatment or other patient management decisions. A negative result may occur with  improper specimen collection/handling, submission of specimen other than nasopharyngeal swab, presence of viral mutation(s) within the areas targeted by this assay, and inadequate number of viral copies(<138 copies/mL). A negative result must be combined with clinical observations, patient history, and epidemiological information. The expected result is Negative.  Fact Sheet for Patients:  EntrepreneurPulse.com.au  Fact Sheet for Healthcare Providers:  IncredibleEmployment.be  This test is no t yet approved or cleared by the [place] FDA and  has been authorized for detection and/or diagnosis of SARS-CoV-2 by FDA under an Emergency Use Authorization (EUA). This EUA will remain  in effect (meaning this test can be used) for the duration of the COVID-19 declaration under Section 564(b)(1) of the Act, 21 U.S.C.section 360bbb-3(b)(1), unless the authorization is terminated  or revoked sooner.       Influenza A by PCR NEGATIVE NEGATIVE   Influenza B by PCR NEGATIVE NEGATIVE    Comment: (NOTE) The Xpert Xpress SARS-CoV-2/FLU/RSV plus assay is intended as an aid in the diagnosis of influenza from Nasopharyngeal swab specimens and should not be used as a sole basis  for treatment.  Nasal washings and aspirates are unacceptable for Xpert Xpress SARS-CoV-2/FLU/RSV testing.  Fact Sheet for Patients: EntrepreneurPulse.com.au  Fact Sheet for Healthcare Providers: IncredibleEmployment.be  This test is not yet approved or cleared by the [place] FDA and has been authorized for detection and/or diagnosis of SARS-CoV-2 by FDA under an Emergency Use Authorization (EUA). This EUA will remain in effect (meaning this test can be used) for the duration of the COVID-19 declaration under Section 564(b)(1) of the Act, 21 U.S.C. section 360bbb-3(b)(1), unless the authorization is terminated or revoked.     Resp Syncytial Virus by PCR NEGATIVE NEGATIVE    Comment: (NOTE) Fact Sheet for Patients: EntrepreneurPulse.com.au  Fact Sheet for Healthcare Providers: IncredibleEmployment.be  This test is not yet approved or cleared by the [place] FDA and has been authorized for detection and/or diagnosis of SARS-CoV-2 by FDA under an Emergency Use Authorization (EUA). This EUA will remain in effect (meaning this test can be used) for the duration of the COVID-19 declaration under Section 564(b)(1) of the Act, 21 U.S.C. section 360bbb-3(b)(1), unless the authorization is terminated or revoked.  Performed at [facility], [place] [address], [place], [place] [address]   I-stat chem 8, ED (not at [facility], [facility] or [facility])     Status: Abnormal   Collection Time: 09/18/22  6:31 AM  Result Value Ref Range   Sodium 144 135 - 145 mmol/L   Potassium 4.4 3.5 - 5.1 mmol/L   Chloride 110 98 - 111 mmol/L   BUN 62 (H) 6 - 20 mg/dL   Creatinine, Ser 12.50 (H) 0.61 - 1.24 mg/dL   Glucose, Bld 263 (H) 70 - 99 mg/dL    Comment: Glucose reference range applies only to samples taken after fasting for at least 8 hours.   Calcium, Ion 1.26 1.15 - 1.40 mmol/L   TCO2 22 22 - 32 mmol/L   Hemoglobin 13.6 13.0 - 17.0 g/dL    HCT 40.0 39.0 - 52.0 %  Troponin I (High Sensitivity)     Status: Abnormal   Collection Time: 09/18/22  8:04 AM  Result Value Ref Range   Troponin I (High Sensitivity) 196 (HH) <18 ng/L    Comment: CRITICAL VALUE NOTED. VALUE IS CONSISTENT WITH PREVIOUSLY REPORTED/CALLED VALUE (NOTE) Elevated high sensitivity troponin I (hsTnI) values and significant  changes across serial measurements may suggest ACS but many other  chronic and acute conditions are known to elevate hsTnI results.  Refer to the "Links" section for chest pain algorithms and additional  guidance. Performed at [facility], [place] [address] [place],  [address]   I-Stat venous blood gas, ED     Status: Abnormal   Collection Time: 09/18/22  8:10 AM  Result Value Ref Range   pH, Ven 7.432 (H) 7.25 - 7.43   pCO2, Ven 27.5 (L) 44 - 60 mmHg   pO2, Ven 39 32 - 45 mmHg   Bicarbonate 18.3 (L) 20.0 - 28.0 mmol/L   TCO2 19 (L) 22 - 32 mmol/L   O2 Saturation 77 %   Acid-base deficit 5.0 (H) 0.0 - 2.0 mmol/L   Sodium 142 135 - 145 mmol/L   Potassium 4.4 3.5 - 5.1 mmol/L   Calcium, Ion 1.14 (L) 1.15 - 1.40 mmol/L   HCT 38.0 (L) 39.0 - 52.0 %   Hemoglobin 12.9 (L) 13.0 - 17.0 g/dL   Sample type VENOUS    Comment NOTIFIED PHYSICIAN   Renal function panel  Status: Abnormal   Collection Time: 09/18/22  9:14 AM  Result Value Ref Range   Sodium 140 135 - 145 mmol/L   Potassium 4.3 3.5 - 5.1 mmol/L   Chloride 106 98 - 111 mmol/L   CO2 21 (L) 22 - 32 mmol/L   Glucose, Bld 222 (H) 70 - 99 mg/dL    Comment: Glucose reference range applies only to samples taken after fasting for at least 8 hours.   BUN 65 (H) 6 - 20 mg/dL   Creatinine, Ser 11.13 (H) 0.61 - 1.24 mg/dL   Calcium 9.5 8.9 - 10.3 mg/dL   Phosphorus 4.3 2.5 - 4.6 mg/dL   Albumin 3.6 3.5 - 5.0 g/dL   GFR, Estimated 5 (L) >60 mL/min    Comment: (NOTE) Calculated using the CKD-EPI Creatinine Equation (2021)    Anion gap 13 5 - 15    Comment: Performed at  [facility] [address], [place], [place] [address]  CBC     Status: Abnormal   Collection Time: 09/18/22  9:14 AM  Result Value Ref Range   WBC 13.9 (H) 4.0 - 10.5 K/uL   RBC 4.14 (L) 4.22 - 5.81 MIL/uL   Hemoglobin 11.5 (L) 13.0 - 17.0 g/dL   HCT 36.3 (L) 39.0 - 52.0 %   MCV 87.7 80.0 - 100.0 fL   MCH 27.8 26.0 - 34.0 pg   MCHC 31.7 30.0 - 36.0 g/dL   RDW 17.2 (H) 11.5 - 15.5 %   Platelets 329 150 - 400 K/uL   nRBC 0.0 0.0 - 0.2 %    Comment: Performed at [name], [place] [address] [place], [place] [address]  CBG monitoring, ED     Status: Abnormal   Collection Time: 09/18/22  2:53 PM  Result Value Ref Range   Glucose-Capillary 153 (H) 70 - 99 mg/dL    Comment: Glucose reference range applies only to samples taken after fasting for at least 8 hours.   DG Chest Portable 1 View  Result Date: 09/18/2022 CLINICAL DATA:  43 year old male with respiratory distress. Shortness of breath. EXAM: PORTABLE CHEST 1 VIEW COMPARISON:  Chest radiographs 07/22/2022 and earlier. FINDINGS: Portable AP upright view at 0553 hours. Lordotic positioning. Stable left chest dual lumen dialysis catheter. Mild cardiomegaly and mediastinal contours not significantly changed. Indistinct asymmetric increased lower lung combined interstitial and airspace opacity. No pneumothorax, pleural effusion, or air bronchograms. No acute osseous abnormality identified. IMPRESSION: Cardiomegaly and chronic left chest dialysis catheter. Asymmetric increased vague lower lung opacity suspicious for acute pulmonary edema in this setting. Bilateral respiratory infection felt less likely. Electronically Signed   By: Genevie Ann M.D.   On: 09/18/2022 06:15    Pending Labs Unresulted Labs (From admission, onward)     Start     Ordered   09/19/22 0500  Renal function panel  Tomorrow morning,   R        09/18/22 1035   09/19/22 0500  CBC  Tomorrow morning,   R        09/18/22 1035   09/18/22 1035  Lipid panel  Once,    R        09/18/22 1034   09/18/22 1035  Magnesium  Once,   R        09/18/22 1034   09/18/22 1035  TSH  Once,   R        09/18/22 1034   09/18/22 1035  Phosphorus  Once,  R        09/18/22 1035   09/18/22 0854  Lactic acid, plasma  STAT Now then every 3 hours,   R (with STAT occurrences)      09/18/22 0853   09/18/22 0747  Blood gas, venous  Once,   R        09/18/22 0746   09/17/22 0914  Miscellaneous [facility] test (send-out)  Once,   R        09/17/22 0914            Vitals/Pain Today's Vitals   09/18/22 1200 09/18/22 1235 09/18/22 1430 09/18/22 1450  BP: (!) 185/86 (!) 215/101 (!) 173/71 (!) 162/68  Pulse: 100 100 (!) 106 (!) 103  Resp:  16 (!) 24 (!) 31  SpO2: 98% 98% 95% 95%  Weight:      Height:      PainSc:        Isolation Precautions Airborne and Contact precautions  Medications Medications  aspirin chewable tablet 324 mg (0 mg Oral Hold 09/18/22 0745)  amLODipine (NORVASC) tablet 10 mg (0 mg Oral Hold 09/18/22 0745)  hydrALAZINE (APRESOLINE) injection 2 mg (0 mg Intravenous Hold 09/18/22 0745)  heparin injection 5,000 Units (5,000 Units Subcutaneous Not Given 09/18/22 1357)  acetaminophen (TYLENOL) tablet 650 mg (650 mg Oral Given 09/18/22 1359)  ondansetron (ZOFRAN) injection 4 mg (has no administration in time range)    Or  ondansetron (ZOFRAN) tablet 4 mg (has no administration in time range)  Chlorhexidine Gluconate Cloth 2 % PADS 6 each (has no administration in time range)  atorvastatin (LIPITOR) tablet 80 mg (80 mg Oral Given 09/18/22 1352)  ferric citrate (AURYXIA) tablet 420 mg (420 mg Oral Given 09/18/22 1352)  carvedilol (COREG) tablet 37.5 mg (has no administration in time range)  cloNIDine (CATAPRES) tablet 0.3 mg (0.3 mg Oral Given 09/18/22 1351)  glipiZIDE (GLUCOTROL) tablet 5 mg (has no administration in time range)  methocarbamol (ROBAXIN) tablet 500 mg (500 mg Oral Given 09/18/22 1352)  insulin glargine-yfgn (SEMGLEE) injection 37 Units (has no  administration in time range)  insulin aspart (novoLOG) injection 0-6 Units (has no administration in time range)  insulin aspart (novoLOG) injection 10 Units (has no administration in time range)  heparin sodium (porcine) 1000 UNIT/ML injection (has no administration in time range)  hydrALAZINE (APRESOLINE) tablet 100 mg (has no administration in time range)  nitroGLYCERIN (NITROGLYN) 2 % ointment 1 inch (1 inch Topical Given 09/18/22 0612)  furosemide (LASIX) injection 80 mg (80 mg Intravenous Given 09/18/22 0611)  haloperidol lactate (HALDOL) injection 1 mg (1 mg Intravenous Given 09/18/22 0617)    Mobility walks     Focused Assessments Renal Assessment Handoff:  Hemodialysis Schedule: Hemodialysis Schedule: Monday/Wednesday/Friday Last Hemodialysis date and time: 09/18/2022    Restricted appendage: left  chest    R Recommendations: See Admitting Provider Note  Report given to:   Additional Notes: Insulin held until patient eats his meal, states he is not hungry at this time

## 2022-09-18 NOTE — Progress Notes (Signed)
Heart Failure Navigator Progress Note  Assessed for Heart & Vascular TOC clinic readiness.  Patient does not meet criteria due to ESRD on hemodialysis.   Navigator will sign off at this time.    Keslie Gritz, BSN, RN Heart Failure Nurse Navigator Secure Chat Only   

## 2022-09-18 NOTE — ED Notes (Signed)
Pt in dialysis, meds due this morning will be given now

## 2022-09-18 NOTE — ED Notes (Signed)
Internal medicine provider removed patient's bipap. Patient began to desat down to 85%. Patient placed on 6L [place] and patient was at 88% on 6L and RR were 34. RT called and patient is being placed back on Bipap

## 2022-09-18 NOTE — ED Provider Notes (Signed)
[facility] EMERGENCY DEPARTMENT Provider Note   CSN: [id number] Arrival date & time: 09/18/22  0559     History  Chief Complaint  Patient presents with   Shortness of Breath    BIBA for SOB, onset this morning. Pt uncooperative d/t feeling like he cannot breathe. Pt received 2 SL nitro with EMS. EMS attempted to place pt on BIPAP, pt uncooperative. Pt endorsees feeling better standing. Keeps standing up out of bed. Pt recently had dialysis cath placed in L chest    Jeremy [name] is a 42 y.o. male.  The history is provided by the EMS personnel. The history is limited by the condition of the patient.  Shortness of Breath Severity:  Severe Timing:  Constant Progression:  Unchanged Chronicity:  Recurrent Relieved by:  Nothing Worsened by:  Nothing Ineffective treatments:  None tried Associated symptoms: chest pain   Associated symptoms: no diaphoresis and no ear pain   Risk factors: no recent alcohol use   Patient with ESRD TTS who missed one treated due to a line issue presents with severe SOB and elevated BP.  Unable to tolerate CPAP for EMS.       Home Medications Prior to Admission medications   Medication Sig Start Date End Date Taking? Authorizing Provider  Accu-Chek Softclix Lancets lancets Use as directed to check blood sugar four times daily. 02/24/22   Rick Duff, MD  acetaminophen (TYLENOL) 500 MG tablet Take 1,000 mg by mouth every 6 (six) hours as needed for moderate pain.    [provider]  amLODipine (NORVASC) 10 MG tablet Take 1 tablet (10 mg total) by mouth daily. 01/10/22   Axel Filler, MD  atorvastatin (LIPITOR) 80 MG tablet Take 1 tablet (80 mg total) by mouth daily. NEEDS FOLLOW UP APPOINTMENT FOR ANYMORE REFILLS 09/04/22   Bensimhon, Shaune Pascal, MD  AURYXIA 1 GM 210 MG(Fe) tablet Take 420 mg by mouth 3 (three) times daily. 08/05/21   [provider]  blood glucose meter kit and supplies KIT Dispense based  on patient and insurance preference. Use up to four times daily as directed. (FOR ICD-9 250.00, 250.01). 07/21/18   Katherine Roan, MD  Blood Glucose Monitoring Suppl (ACCU-CHEK GUIDE) w/Device KIT Use to test blood sugar 02/24/22   Rick Duff, MD  Blood Pressure Monitoring (FORA P20 BLOOD PRESSURE CUFF) MISC Check your blood pressure everyday in the morning. 02/24/22   Rick Duff, MD  carvedilol (COREG) 25 MG tablet Take 1.5 tablets (37.5 mg total) by mouth 2 (two) times daily with a meal. Need to schedule an appointment for further refills 09/04/22   [name], MD  cloNIDine (CATAPRES) 0.3 MG tablet Take 1 tablet (0.3 mg total) by mouth 2 (two) times daily. 09/04/22   [name], MD  Doxercalciferol (HECTOROL IV) Inject 1 Dose into the vein Every Tuesday,Thursday,and Saturday with dialysis. 06/05/22 06/04/23  [provider]  gentamicin 150 mg in dextrose 5 % 50 mL Inject 150 mg into the vein Every Tuesday,Thursday,and Saturday with dialysis. 07/24/22   Nani Gasser, MD  glipiZIDE (GLUCOTROL) 5 MG tablet Take 1 tablet (5 mg total) by mouth daily before breakfast. 03/07/22   Lacinda Axon, MD  glucose blood (ACCU-CHEK GUIDE) test strip USE TO CHECK BLOOD SUGAR 4 TIMES DAILY 02/24/22   Rick Duff, MD  hydrALAZINE (APRESOLINE) 100 MG tablet Take 1 tablet (100 mg total) by mouth 3 (three) times daily. 01/31/22   Bensimhon, Shaune Pascal, MD  ibuprofen (ADVIL) 200 MG tablet Take 400 mg by mouth every 6 (six) hours as needed for moderate pain.    [provider]  insulin aspart (NOVOLOG FLEXPEN) 100 UNIT/ML FlexPen Inject 15 Units into the skin 3 (three) times daily with meals. Patient taking differently: Inject 17 Units into the skin 3 (three) times daily with meals. 03/25/22 11/10/22  Masters, Katie, DO  insulin glargine (LANTUS) 100 unit/mL SOPN Inject 37 Units into the skin at bedtime. 08/26/22   Rick Duff, MD  Insulin Pen Needle (PEN NEEDLES)  31G X 5 MM MISC Use four times daily with insulin. 08/26/22   Rick Duff, MD  Insulin Syringe-Needle U-100 Flossie Buffy INSULIN SYRINGE) 30G X 1/2" 0.5 ML MISC USE 4 TIMES DAILY WITH INSULIN INJECTIONS. 10/07/21 10/07/22  [place] Beard, MD  lidocaine (LIDODERM) 5 % Place 1 patch onto the skin daily. Remove and Discard patch within 12 hours or as directed. 07/09/22   Prosperi, Christian H, PA-C  methocarbamol (ROBAXIN) 500 MG tablet Take 1 tablet (500 mg total) by mouth 2 (two) times daily. 07/09/22   Prosperi, Christian H, PA-C  multivitamin (RENA-VIT) TABS tablet Take 1 tablet by mouth daily.    [provider]  nitroGLYCERIN (NITROSTAT) 0.4 MG SL tablet PLACE 1 TABLET UNDER THE TONGUE EVERY 5 MINUTES AS NEEDED FOR CHEST PAIN. Patient taking differently: Place 0.4 mg under the tongue every 5 (five) minutes as needed for chest pain. 06/28/19   [name], MD  ondansetron (ZOFRAN) 4 MG tablet Take 1 tablet (4 mg total) by mouth daily as needed. 07/23/22     ondansetron (ZOFRAN-ODT) 4 MG disintegrating tablet Take 1 tablet (4 mg total) by mouth every 8 (eight) hours as needed for nausea or vomiting. 07/02/21   Smoot, Leary Roca, PA-C  RENVELA 800 MG tablet Take 1,600 mg by mouth 3 (three) times daily with meals. 07/28/21   [provider]  vancomycin (VANCOCIN) 1-5 GM/200ML-% SOLN Inject 200 mLs (1,000 mg total) into the vein Every Tuesday,Thursday,and Saturday with dialysis. 07/24/22   Nani Gasser, MD      Allergies    Acyclovir and related and Imdur [isosorbide nitrate]    Review of Systems   Review of Systems  Unable to perform ROS: Acuity of condition  Constitutional:  Negative for diaphoresis.  HENT:  Negative for ear pain.   Respiratory:  Positive for shortness of breath.   Cardiovascular:  Positive for chest pain.    Physical Exam Updated Vital Signs BP (!) 205/101   Pulse (!) 121   Ht 6\' 5"  (1.956 m)   Wt 120.2 kg   SpO2 98%   BMI 31.42 kg/m   Physical Exam Vitals and nursing note reviewed. Exam conducted with a chaperone present.  Constitutional:      General: He is in acute distress.     Appearance: He is well-developed. He is not diaphoretic.  HENT:     Head: Normocephalic and atraumatic.     Nose: Nose normal.  Eyes:     Conjunctiva/sclera: Conjunctivae normal.     Pupils: Pupils are equal, round, and reactive to light.  Cardiovascular:     Rate and Rhythm: Regular rhythm. Tachycardia present.     Pulses: Normal pulses.     Heart sounds: Normal heart sounds.  Pulmonary:     Effort: Tachypnea present.     Breath sounds: Decreased air movement present. Rales present. No wheezing.  Abdominal:     General: Bowel sounds are normal.  Palpations: Abdomen is soft.     Tenderness: There is no abdominal tenderness. There is no guarding or rebound.  Musculoskeletal:        General: Normal range of motion.     Cervical back: Normal range of motion and neck supple.  Skin:    General: Skin is warm and dry.     Capillary Refill: Capillary refill takes less than 2 seconds.  Neurological:     Mental Status: He is alert and oriented to person, place, and time.  Psychiatric:        Mood and Affect: Mood normal.     ED Results / Procedures / Treatments   Labs (all labs ordered are listed, but only abnormal results are displayed) Results for orders placed or performed during the hospital encounter of 09/18/22  CBC with Differential  Result Value Ref Range   WBC 9.8 4.0 - 10.5 K/uL   RBC 4.37 4.22 - 5.81 MIL/uL   Hemoglobin 11.9 (L) 13.0 - 17.0 g/dL   HCT 40.5 39.0 - 52.0 %   MCV 92.7 80.0 - 100.0 fL   MCH 27.2 26.0 - 34.0 pg   MCHC 29.4 (L) 30.0 - 36.0 g/dL   RDW 17.5 (H) 11.5 - 15.5 %   Platelets 370 150 - 400 K/uL   nRBC 0.0 0.0 - 0.2 %   Neutrophils Relative % 74 %   Neutro Abs 7.2 1.7 - 7.7 K/uL   Lymphocytes Relative 17 %   Lymphs Abs 1.7 0.7 - 4.0 K/uL   Monocytes Relative 6 %   Monocytes Absolute 0.6 0.1  - 1.0 K/uL   Eosinophils Relative 2 %   Eosinophils Absolute 0.2 0.0 - 0.5 K/uL   Basophils Relative 1 %   Basophils Absolute 0.1 0.0 - 0.1 K/uL   Immature Granulocytes 0 %   Abs Immature Granulocytes 0.02 0.00 - 0.07 K/uL   DG Chest Portable 1 View  Result Date: 09/18/2022 CLINICAL DATA:  42 year old male with respiratory distress. Shortness of breath. EXAM: PORTABLE CHEST 1 VIEW COMPARISON:  Chest radiographs 07/22/2022 and earlier. FINDINGS: Portable AP upright view at 0553 hours. Lordotic positioning. Stable left chest dual lumen dialysis catheter. Mild cardiomegaly and mediastinal contours not significantly changed. Indistinct asymmetric increased lower lung combined interstitial and airspace opacity. No pneumothorax, pleural effusion, or air bronchograms. No acute osseous abnormality identified. IMPRESSION: Cardiomegaly and chronic left chest dialysis catheter. Asymmetric increased vague lower lung opacity suspicious for acute pulmonary edema in this setting. Bilateral respiratory infection felt less likely. Electronically Signed   By: Genevie Ann M.D.   On: 09/18/2022 06:15    EKG Sinus tachycardia HR 121 with LAFB    Radiology No results found.  Procedures Procedures    Medications Ordered in ED Medications  haloperidol lactate (HALDOL) injection 1 mg (has no administration in time range)  nitroGLYCERIN (NITROGLYN) 2 % ointment 1 inch (1 inch Topical Given 09/18/22 0612)  furosemide (LASIX) injection 80 mg (80 mg Intravenous Given 09/18/22 4709)    ED Course/ Medical Decision Making/ A&P                           Medical Decision Making Patient with ESRD TTS who missed a session presents with SOB and elevated BP   Problems Addressed: Acute pulmonary edema [facility]):    Details: Lasix given, bipap initiated to be admitted am dialysis  ESRD (end stage renal disease) [facility]):    Details: Nephrology contacted  for dialysis  Hypertensive crisis:    Details: Improved with nitro paste    Amount and/or Complexity of Data Reviewed Independent Historian: EMS    Details: See above  External Data Reviewed: notes.    Details: Previous notes reviewed, patient is an IM teaching patient  Labs: ordered.    Details: All labs reviewed: normal white count,  hemoglobin low 11.9, normal platelet count 370K.  Normal sodium 144, normal potassium 4.4, elevated creatinine. COVId and flu are pending.   Radiology: ordered and independent interpretation performed.    Details: Pulmonary edema by me on CXR  ECG/medicine tests: ordered and independent interpretation performed. Decision-making details documented in ED Course. Discussion of management or test interpretation with external provider(s): Dr. Royce Macadamia, please admit to medicine and will do dialysis in am. Agrees with medication choices  Case d/w IM resident who will see patient for admission    Risk Prescription drug management. Decision regarding hospitalization.  Critical Care Total time providing critical care: 31 minutes (Consults and BIPAP and review of Records and bedside evaluation and reevaluation )   CRITICAL CARE Performed by: Gian Ybarra K Oleg Oleson-Rasch Total critical care time: 31 minutes Critical care time was exclusive of separately billable procedures and treating other patients. Critical care was necessary to treat or prevent imminent or life-threatening deterioration. Critical care was time spent personally by me on the following activities: development of treatment plan with patient and/or surrogate as well as nursing, discussions with consultants, evaluation of patient's response to treatment, examination of patient, obtaining history from patient or surrogate, ordering and performing treatments and interventions, ordering and review of laboratory studies, ordering and review of radiographic studies, pulse oximetry and re-evaluation of patient's condition.  Final Clinical Impression(s) / ED Diagnoses Final diagnoses:   Acute pulmonary edema ([place])  ESRD (end stage renal disease) ([place])  Hypertensive crisis   The patient appears reasonably stabilized for admission considering the current resources, flow, and capabilities available in the ED at this time, and I doubt any other [facility] requiring further screening and/or treatment in the ED prior to admission.  Rx / DC Orders ED Discharge Orders     None         Eveleen Mcnear, MD 09/18/22 [phone]

## 2022-09-18 NOTE — Progress Notes (Signed)
Pt receives out-pt HD at [facility] on TTS. Will assist as needed.   Sury Wentworth Renal Navigator [phone] 

## 2022-09-18 NOTE — Consult Note (Signed)
Renal Service Consult Note [facility]  Jeremy [name] 09/18/2022 Jeremy Blazing, MD Requesting Physician: Dr. Saverio Danker  Reason for Consult: ESRD pt w/ resp distress HPI: The patient is a 42 y.o. year-old w/ PMH as below who presented w/ SOB, onset this am. Rec'd 2 SL ntg w/ EMS. In ED bipap was tried w/ varying results. CXR shows moderated bilat pulm edema.  Pt is being admitted. We are asked to see for dialysis.   Pt seen in ED, main c/o is SOB, no CP or fever or prod cough.   ROS - denies CP, no joint pain, no HA, no blurry vision, no rash, no diarrhea, no nausea/ vomiting   Past Medical History  Past Medical History:  Diagnosis Date   Abscess of left groin    Acute blood loss anemia 11/11/2013   Anemia in chronic kidney disease (CKD)    Asthma    Boil of scrotum 11/21/2015   Chest pain    a. 01/2015 Lexiscan MV: EF 28%, inferior, inferolateral, apical ischemia;  b. 01/2015 Cath: nl cors, PCWP 18 mmHg, CO 9.38 L/min, CI 3.53 L/min/m^2.   CHF (congestive heart failure) (HCC)    Depression    Situational   ESRD (end stage renal disease) ([place])    T[facility]S- [place]   Essential hypertension    Family history of adverse reaction to anesthesia    sister- "it was too much for her heart" died   GERD (gastroesophageal reflux disease)    Hyperlipidemia    Membranous glomerulonephritis    Morbid obesity ([place])    Nonischemic cardiomyopathy ([place])    a. 01/2015 Echo: EF 20-25%, diff HK, Gr 2 DD, Triv AI, mildly dil LA and Ao root.   PONV (postoperative nausea and vomiting)    Seizures ([place]) 02/29/2020   "electralytes were out of wack."   Type II diabetes mellitus ([place])    a. 01/2015 HbA1c = 8.9.   Past Surgical History  Past Surgical History:  Procedure Laterality Date   A/V FISTULAGRAM N/A 08/12/2021   Procedure: A/V FISTULAGRAM;  Surgeon: Waynetta Sandy, MD;  Location: [facility] CV [facility];  Service: Cardiovascular;  Laterality: N/A;   AV FISTULA  PLACEMENT Right 12/18/2020   Procedure: RIGHT ARM RADIOCEPHALIC  ARTERIOVENOUS (AV) FISTULA CREATION;  Surgeon: Waynetta Sandy, MD;  Location: [facility];  Service: Vascular;  Laterality: Right;   CARDIAC CATHETERIZATION N/A 02/01/2015   Procedure: Right/Left Heart Cath and Coronary Angiography;  Surgeon: Josue Hector, MD;  Location: [facility] CV [facility];  Service: Cardiovascular;  Laterality: N/A;   IR FLUORO GUIDE CV LINE LEFT  07/21/2022   IR FLUORO GUIDE CV LINE LEFT  07/21/2022   IR FLUORO GUIDE CV LINE RIGHT  10/11/2019   IR REMOVAL TUN CV CATH W/O FL  07/17/2022   IR REMOVAL TUN CV CATH W/O FL  08/18/2022   IR US GUIDE VASC ACCESS LEFT  07/21/2022   IR US GUIDE VASC ACCESS LEFT  07/21/2022   IR US GUIDE VASC ACCESS RIGHT  10/11/2019   TEE WITHOUT CARDIOVERSION N/A 07/18/2022   Procedure: TRANSESOPHAGEAL ECHOCARDIOGRAM (TEE);  Surgeon: Janina Mayo, MD;  Location: [facility] ENDOSCOPY;  Service: Cardiovascular;  Laterality: N/A;   THORACOTOMY Left 11/08/2013   Procedure: LEFT THORACOTOMY;  Surgeon: Ivin Poot, MD;  Location: [facility] OR;  Service: Thoracic;  Laterality: Left;   Family History  Family History  Problem Relation Age of Onset   Diabetes Mellitus II Mother  died @ 18.   Gastric cancer Mother    CAD Father        died @ 72.   Heart attack Father    Congestive Heart Failure Father    Diabetes Mellitus II Sister    CAD Sister        s/p PCI - age 67.   Social History  reports that he has never smoked. He has never used smokeless tobacco. He reports current drug use. Drug: Marijuana. He reports that he does not drink alcohol. Allergies  Allergies  Allergen Reactions   Acyclovir And Related Hives   Imdur [Isosorbide Nitrate]     Headaches from nitrate   Home medications Prior to Admission medications   Medication Sig Start Date End Date Taking? Authorizing Provider  acetaminophen (TYLENOL) 500 MG tablet Take 1,000 mg by mouth every 6 (six) hours as needed for  moderate pain.   Yes [provider]  amLODipine (NORVASC) 10 MG tablet Take 1 tablet (10 mg total) by mouth daily. 01/10/22  Yes Axel Filler, MD  atorvastatin (LIPITOR) 80 MG tablet Take 1 tablet (80 mg total) by mouth daily. NEEDS FOLLOW UP APPOINTMENT FOR ANYMORE REFILLS 09/04/22  Yes Bensimhon, Shaune Pascal, MD  AURYXIA 1 GM 210 MG(Fe) tablet Take 420 mg by mouth 3 (three) times daily. 08/05/21  Yes [provider]  carvedilol (COREG) 25 MG tablet Take 1.5 tablets (37.5 mg total) by mouth 2 (two) times daily with a meal. Need to schedule an appointment for further refills 09/04/22  Yes [name], MD  cloNIDine (CATAPRES) 0.3 MG tablet Take 1 tablet (0.3 mg total) by mouth 2 (two) times daily. 09/04/22  Yes [name], MD  Doxercalciferol (HECTOROL IV) Inject 1 Dose into the vein Every Tuesday,Thursday,and Saturday with dialysis. 06/05/22 06/04/23 Yes [provider]  gentamicin 150 mg in dextrose 5 % 50 mL Inject 150 mg into the vein Every Tuesday,Thursday,and Saturday with dialysis. 07/24/22  Yes Nani Gasser, MD  glipiZIDE (GLUCOTROL) 5 MG tablet Take 1 tablet (5 mg total) by mouth daily before breakfast. 03/07/22  Yes Amponsah, Charisse March, MD  glucose blood (ACCU-CHEK GUIDE) test strip USE TO CHECK BLOOD SUGAR 4 TIMES DAILY 02/24/22  Yes Rick Duff, MD  hydrALAZINE (APRESOLINE) 100 MG tablet Take 1 tablet (100 mg total) by mouth 3 (three) times daily. 01/31/22  Yes Bensimhon, Shaune Pascal, MD  insulin aspart (NOVOLOG FLEXPEN) 100 UNIT/ML FlexPen Inject 15 Units into the skin 3 (three) times daily with meals. Patient taking differently: Inject 17 Units into the skin 3 (three) times daily with meals. 03/25/22 11/10/22 Yes Masters, Katie, DO  insulin glargine (LANTUS) 100 unit/mL SOPN Inject 37 Units into the skin at bedtime. 08/26/22  Yes Rick Duff, MD  methocarbamol (ROBAXIN) 500 MG tablet Take 1 tablet (500 mg total) by mouth 2 (two) times  daily. 07/09/22  Yes Prosperi, Christian H, PA-C  multivitamin (RENA-VIT) TABS tablet Take 1 tablet by mouth daily.   Yes [provider]  nitroGLYCERIN (NITROSTAT) 0.4 MG SL tablet PLACE 1 TABLET UNDER THE TONGUE EVERY 5 MINUTES AS NEEDED FOR CHEST PAIN. Patient taking differently: Place 0.4 mg under the tongue every 5 (five) minutes as needed for chest pain. 06/28/19  Yes [name], MD  ondansetron (ZOFRAN) 4 MG tablet Take 1 tablet (4 mg total) by mouth daily as needed. 07/23/22  Yes   RENVELA 800 MG tablet Take 800 mg by mouth daily. 07/28/21  Yes [provider]  vancomycin (VANCOCIN) 1-5 GM/200ML-% SOLN Inject 200 mLs (1,000 mg total) into the vein Every Tuesday,Thursday,and Saturday with dialysis. 07/24/22  Yes Nani Gasser, MD  Accu-Chek Softclix Lancets lancets Use as directed to check blood sugar four times daily. 02/24/22   Rick Duff, MD  blood glucose meter kit and supplies KIT Dispense based on patient and insurance preference. Use up to four times daily as directed. (FOR ICD-9 250.00, 250.01). 07/21/18   Katherine Roan, MD  Blood Glucose Monitoring Suppl (ACCU-CHEK GUIDE) w/Device KIT Use to test blood sugar 02/24/22   Rick Duff, MD  Blood Pressure Monitoring (FORA P20 BLOOD PRESSURE CUFF) MISC Check your blood pressure everyday in the morning. 02/24/22   Rick Duff, MD  Insulin Pen Needle (PEN NEEDLES) 31G X 5 MM MISC Use four times daily with insulin. 08/26/22   Rick Duff, MD  Insulin Syringe-Needle U-100 Flossie Buffy INSULIN SYRINGE) 30G X 1/2" 0.5 ML MISC USE 4 TIMES DAILY WITH INSULIN INJECTIONS. 10/07/21 10/07/22  Iona Beard, MD  lidocaine (LIDODERM) 5 % Place 1 patch onto the skin daily. Remove and Discard patch within 12 hours or as directed. Patient not taking: Reported on 09/18/2022 07/09/22   Anselmo Pickler, [place]     Vitals:   09/18/22 0746 09/18/22 0800 09/18/22 0805 09/18/22 0810  BP:  (!) 203/97 (!)  218/101 (!) 220/87  Pulse: (!) 103 99 94 99  Resp: (!) 32 (!) 27 (!) 29   SpO2: 94% 100% 98% 98%  Weight:      Height:       Exam Gen alert, no distress, bipap in place Mild^wob No rash, cyanosis or gangrene Sclera anicteric, throat clear  No jvd or bruits Chest clear bilat to bases, no rales/ wheezing RRR no RG Abd soft ntnd no mass or ascites +bs GU normal male MS no joint effusions or deformity Ext 1+ LE edema, no wounds or ulcers Neuro is alert, Ox 3 , nf    LIJ TDC intact    Home meds include- norvasc 10, hydralazine 100 tid, insulin aspart/ glargine, renavite, renvela 800 ac tid, prns/ vits/ supps     [facility]  [facility]  400/800   122.5kg   TDC  Hep 5000+ 4042midrun - last HD 1/06, post wt 123kg - hectorol 11 mcg IV tiw - no esa   Assessment/ Plan: AHRF/ pulm edema/ resp distress - missed HD on Tuesday. On bipap in ED. CXR bilat sig pulm edema due to vol overload. Will get upstairs asap for dialysis. ESRD - HD TTS. HD today upstairs on schedule.  HTN/ volume - vol overload most likely. Get wt's post HD when more stable. BP's high, wean IV ntg during HD per symptoms. Cont home meds.  Anemia esrd - Hb 12-13, no esa needs MBD ckd - Ca in range, add on phos and albumin. Cont IV vdra and renvela as binder.  DM2 NICM    Rob Mahmood Boehringer  MD 09/18/2022, 8:24 AM Recent Labs  Lab 09/18/22 0606 09/18/22 0631 09/18/22 0810  HGB  --  13.6 12.9*  CALCIUM 9.4  --   --   CREATININE 10.96* 12.50*  --   K 4.4 4.4 4.4   Inpatient medications:  amLODipine  10 mg Oral Once   aspirin  324 mg Oral Once   Chlorhexidine Gluconate Cloth  6 each Topical Q0600   heparin  5,000 Units Subcutaneous Q8H   hydrALAZINE  2 mg Intravenous Once  nitroGLYCERIN 15 mcg/min (09/18/22 0823)   acetaminophen, ondansetron (ZOFRAN) IV **OR** ondansetron

## 2022-09-18 NOTE — Progress Notes (Signed)
Pt has reached 524ml on hd tx. Respiratory in to reassess need for bipap and attempt HFNC.

## 2022-09-18 NOTE — Progress Notes (Signed)
Patient transported to dialysis on bipap. RN at bedside.

## 2022-09-18 NOTE — Progress Notes (Signed)
Patient taken off bipap by MD and placed on 6L [place]. Sats were 88% on RT arrival. Patient has increased WOB. RT placed patient back on bipap at this time.

## 2022-09-19 ENCOUNTER — Other Ambulatory Visit (HOSPITAL_COMMUNITY): Payer: Self-pay

## 2022-09-19 DIAGNOSIS — J810 Acute pulmonary edema: Principal | ICD-10-CM

## 2022-09-19 LAB — RENAL FUNCTION PANEL
Albumin: 2.9 g/dL — ABNORMAL LOW (ref 3.5–5.0)
Anion gap: 10 (ref 5–15)
BUN: 41 mg/dL — ABNORMAL HIGH (ref 6–20)
CO2: 25 mmol/L (ref 22–32)
Calcium: 8.5 mg/dL — ABNORMAL LOW (ref 8.9–10.3)
Chloride: 103 mmol/L (ref 98–111)
Creatinine, Ser: 8.04 mg/dL — ABNORMAL HIGH (ref 0.61–1.24)
GFR, Estimated: 8 mL/min — ABNORMAL LOW (ref >60)
Glucose, Bld: 194 mg/dL — ABNORMAL HIGH (ref 70–99)
Phosphorus: 4.2 mg/dL (ref 2.5–4.6)
Potassium: 3.8 mmol/L (ref 3.5–5.1)
Sodium: 138 mmol/L (ref 135–145)

## 2022-09-19 LAB — CBC
HCT: 32.1 % — ABNORMAL LOW (ref 39.0–52.0)
Hemoglobin: 9.9 g/dL — ABNORMAL LOW (ref 13.0–17.0)
MCH: 27.1 pg (ref 26.0–34.0)
MCHC: 30.8 g/dL (ref 30.0–36.0)
MCV: 87.9 fL (ref 80.0–100.0)
Platelets: 253 10*3/uL (ref 150–400)
RBC: 3.65 MIL/uL — ABNORMAL LOW (ref 4.22–5.81)
RDW: 17.2 % — ABNORMAL HIGH (ref 11.5–15.5)
WBC: 9.9 10*3/uL (ref 4.0–10.5)
nRBC: 0 % (ref 0.0–0.2)

## 2022-09-19 LAB — BLOOD GAS, VENOUS
Acid-Base Excess: 2.4 mmol/L — ABNORMAL HIGH (ref 0.0–2.0)
Bicarbonate: 26.4 mmol/L (ref 20.0–28.0)
Drawn by: 66642
O2 Saturation: 95.4 %
Patient temperature: 36.6
pCO2, Ven: 37 mmHg — ABNORMAL LOW (ref 44–60)
pH, Ven: 7.46 — ABNORMAL HIGH (ref 7.25–7.43)
pO2, Ven: 68 mmHg — ABNORMAL HIGH (ref 32–45)

## 2022-09-19 LAB — GLUCOSE, CAPILLARY
Glucose-Capillary: 118 mg/dL — ABNORMAL HIGH (ref 70–99)
Glucose-Capillary: 160 mg/dL — ABNORMAL HIGH (ref 70–99)
Glucose-Capillary: 101 mg/dL — ABNORMAL HIGH (ref 70–99)

## 2022-09-19 LAB — MISC LABCORP TEST (SEND OUT): Labcorp test code: 6510

## 2022-09-19 LAB — HEPATITIS B SURFACE ANTIGEN: Hepatitis B Surface Ag: NONREACTIVE

## 2022-09-19 LAB — TROPONIN I (HIGH SENSITIVITY): Troponin I (High Sensitivity): 155 ng/L (ref <18)

## 2022-09-19 MED ORDER — INSULIN GLARGINE-YFGN 100 UNIT/ML ~~LOC~~ SOLN
20.0000 [IU] | Freq: Every day | SUBCUTANEOUS | Status: DC
  Filled 2022-09-19: qty 0.2

## 2022-09-19 MED ORDER — INSULIN GLARGINE-YFGN 100 UNIT/ML ~~LOC~~ SOPN
20.0000 [IU] | PEN_INJECTOR | Freq: Every day | SUBCUTANEOUS | 11 refills | Status: DC
  Filled 2022-09-19: qty 10, 50d supply, fill #0
  Filled 2022-09-22: qty 15, 75d supply, fill #0
  Filled 2023-02-16: qty 15, 75d supply, fill #1

## 2022-09-19 MED ORDER — CHLORHEXIDINE GLUCONATE CLOTH 2 % EX PADS: 6.0000 | MEDICATED_PAD | Freq: Every day | CUTANEOUS | Status: DC

## 2022-09-19 MED ORDER — HEPARIN SODIUM (PORCINE) 1000 UNIT/ML IJ SOLN
INTRAMUSCULAR | Status: AC
  Administered 2022-09-19: 5000 [IU]
  Filled 2022-09-19: qty 5

## 2022-09-19 MED ORDER — HEPARIN SODIUM (PORCINE) 1000 UNIT/ML IJ SOLN
INTRAMUSCULAR | Status: AC
  Administered 2022-09-19: 4000 [IU]
  Filled 2022-09-19: qty 4

## 2022-09-19 MED ORDER — HEPARIN SODIUM (PORCINE) 1000 UNIT/ML IJ SOLN
INTRAMUSCULAR | Status: AC
  Administered 2022-09-19: 5200 [IU]
  Filled 2022-09-19: qty 6

## 2022-09-19 NOTE — Progress Notes (Signed)
Patient provided with verbal discharge instructions. Paper copy of discharge provided to patient. RN answered all questions. VSS at discharge. IV removed by patient. Patient belongings sent with patient. Patient dc'd via wheelchair to private vehicle.

## 2022-09-19 NOTE — Discharge Summary (Incomplete)
Name: Myka Lukins MRN: [id number] DOB: 12/20/1980 42 y.o. PCP: Rick Duff, MD  Date of Admission: 09/18/2022  5:59 AM Date of Discharge: 09/19/22 Attending Physician: Dr.  Saverio Danker  Discharge Diagnosis: Principal Problem:   Flash pulmonary edema [facility])    Discharge Medications: Allergies as of 09/19/2022       Reactions   Acyclovir And Related Hives   Imdur [isosorbide Nitrate]    Headaches from nitrate        Medication List     STOP taking these medications    gentamicin 150 mg in dextrose 5 % 50 mL   insulin glargine 100 unit/mL Sopn Commonly known as: LANTUS   lidocaine 5 % Commonly known as: Lidoderm   methocarbamol 500 MG tablet Commonly known as: ROBAXIN   vancomycin 1-5 GM/200ML-% Soln Commonly known as: VANCOCIN       TAKE these medications    Accu-Chek Guide test strip Generic drug: glucose blood USE TO CHECK BLOOD SUGAR 4 TIMES DAILY   Accu-Chek Guide w/Device Kit Use to test blood sugar   Accu-Chek Softclix Lancets lancets Use as directed to check blood sugar four times daily.   acetaminophen 500 MG tablet Commonly known as: TYLENOL Take 1,000 mg by mouth every 6 (six) hours as needed for moderate pain.   amLODipine 10 MG tablet Commonly known as: NORVASC Take 1 tablet (10 mg total) by mouth daily.   atorvastatin 80 MG tablet Commonly known as: LIPITOR Take 1 tablet (80 mg total) by mouth daily. NEEDS FOLLOW UP APPOINTMENT FOR ANYMORE REFILLS   Auryxia 1 GM 210 MG(Fe) tablet Generic drug: ferric citrate Take 420 mg by mouth 3 (three) times daily.   blood glucose meter kit and supplies Kit Dispense based on patient and insurance preference. Use up to four times daily as directed. (FOR ICD-9 250.00, 250.01).   carvedilol 25 MG tablet Commonly known as: COREG Take 1.5 tablets (37.5 mg total) by mouth 2 (two) times daily with a meal. Need to schedule an appointment for further refills   cloNIDine 0.3 MG tablet Commonly  known as: CATAPRES Take 1 tablet (0.3 mg total) by mouth 2 (two) times daily.   FORA P20 Blood Pressure Cuff Misc Check your blood pressure everyday in the morning.   glipiZIDE 5 MG tablet Commonly known as: GLUCOTROL Take 1 tablet (5 mg total) by mouth daily before breakfast.   HECTOROL IV Inject 1 Dose into the vein Every Tuesday,Thursday,and Saturday with dialysis.   hydrALAZINE 100 MG tablet Commonly known as: APRESOLINE Take 1 tablet (100 mg total) by mouth 3 (three) times daily.   insulin glargine-yfgn 100 UNIT/ML injection Commonly known as: SEMGLEE Inject 0.2 mLs (20 Units total) into the skin at bedtime.   multivitamin Tabs tablet Take 1 tablet by mouth daily.   nitroGLYCERIN 0.4 MG SL tablet Commonly known as: NITROSTAT PLACE 1 TABLET UNDER THE TONGUE EVERY 5 MINUTES AS NEEDED FOR CHEST PAIN. What changed:  how much to take how to take this when to take this reasons to take this additional instructions   NovoLOG FlexPen 100 UNIT/ML FlexPen Generic drug: insulin aspart Inject 15 Units into the skin 3 (three) times daily with meals. What changed: how much to take   ondansetron 4 MG tablet Commonly known as: ZOFRAN Take 1 tablet (4 mg total) by mouth daily as needed.   Pen Needles 31G X 5 MM Misc Use four times daily with insulin.   Renvela 800 MG tablet Generic drug: sevelamer  carbonate Take 800 mg by mouth daily.   UltiCare Insulin Syringe 30G X 1/2" 0.5 ML Misc Generic drug: Insulin Syringe-Needle U-100 USE 4 TIMES DAILY WITH INSULIN INJECTIONS.        Disposition and follow-up:   Mr.Krist Cawley was discharged from [facility] in [place] condition.  At the hospital follow up visit please address:  1.  Follow-up: a. HTN- home medications were restarted  B. Diabetes- long acting insulin decreased to 20 units at discharge. F/u on fasting glucose.  2.  Labs / imaging needed at time of follow-up: none  Hospital Course by  problem list: Severe symptomatic hypertension Acute flash pulmonary edema Patient presented for acute dyspnea in setting of missed HD session. He was tachycardic, tachypneic and had severe HTN. CXT concerning for acute pulmonary edema. Labs notable for BNP 1173 With elevated troponin to 168 to 196. He was placed on Bipap and given IV diuresis with nitroglycerin infusion. Urgent HD completed with 3.5 L removal.  Acute respiratory failure with hypoxia Respiratory alkalosis  Metabolic acidosis  Patient was initially placed on nonrebreather without significant improvement and patient was unable to tolerate CPAP.  He was placed on BiPAP on arrival.  VBG showed respiratory alkalosis compensating for metabolic acidosis.  He was able to titrate to 6 L nasal cannula following for session of HD and titrate off of oxygen after second session day of discharge.  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  He was not able to complete his HD session on Tuesday due to line.  BUN/creatinine 68/10.96 but no signs of uremia.  No electrolyte abnormalities.  Hx E. Faecelis Bacteremia  Hx Native Aortic Valve Endocarditis  Patient mated in early November 2023 for night sweats and subjective fever and found to have A-fib Sowles bacteremia as well as negative for aortic valve endocarditis with presumed source from his tunneled HD catheter.  Catheter was replaced and he was discharged with vancomycin and gentamicin.  He has follow-up with cardiology in January and infectious disease in early February.  T2DM Last A1c 8.2% 2 months ago.  He requested to change Lantus dose to 20 units at bedtime and fasting glucose well-controlled with this dose. 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  Last TTE November 2023 showed EF 50 to 55% with  moderate aortic regurg.  BNP greater than thousand in setting of flash pulmonary edema due to missed dialysis session.  Received 2 sessions of HD.  Tropinemia In setting of flash pulmonary edema patient had pleuritic chest pain.'s were elevated 1 68-1 96.  EKG without any ischemic changes.  Troponin down trended to 150.  Elevated troponins in setting of demand ischemia due to flash pulmonary edema.  Anemia of chronic disease Iron deficiency anemia Hemoglobin 11.9 on admission.  Medications include Auryxia 420 mg TID.  Hyperlipidemia Lipid panel completed and showed LDL of 66. Medications include atorvastatin 80 mg.  He is at goal for secondary prevention less than 70.   Discharge Exam:   BP (!) 169/83 (BP Location: Left Arm)   Pulse 96   Temp 98.4 F (36.9 C) (Oral)   Resp 20   Ht 6\' 5"  (1.956 m)   Wt 116.1 kg   SpO2 95%   BMI 30.35 kg/m  Constitutional: well-appearing male, sitting comfortably in bed, in no acute distress Cardiovascular: RRR, no murmurs, rubs or gallops Pulmonary/Chest: Mild rales heard bilaterally Abdominal: soft, non-tender, non-distended Skin: warm and dry Extremities: upper/lower extremity pulses 2+, no lower extremity edema present    Pertinent Labs, Studies, and Procedures:     Latest Ref Rng & Units 09/19/2022   12:46 AM 09/18/2022    9:14 AM 09/18/2022    8:10 AM  CBC  WBC 4.0 - 10.5 K/uL 9.9  13.9    Hemoglobin 13.0 - 17.0 g/dL 9.9  11.5  12.9   Hematocrit 39.0 - 52.0 % 32.1  36.3  38.0   Platelets 150 - 400 K/uL 253  329         Latest Ref Rng & Units 09/19/2022   12:46 AM 09/18/2022    9:14 AM 09/18/2022    8:10 AM  CMP  Glucose 70 - 99 mg/dL 194  222    BUN 6 - 20 mg/dL 41  65    Creatinine 0.61 - 1.24 mg/dL 8.04  11.13    Sodium 135 - 145 mmol/L 138  140  142   Potassium 3.5 - 5.1 mmol/L 3.8  4.3  4.4   Chloride 98 - 111 mmol/L 103  106    CO2 22 - 32 mmol/L 25  21    Calcium 8.9 - 10.3 mg/dL 8.5  9.5      DG Chest Portable 1  View  Result Date: 09/18/2022 CLINICAL DATA:  42 year old male with respiratory distress. Shortness of breath. EXAM: PORTABLE CHEST 1 VIEW COMPARISON:  Chest radiographs 07/22/2022 and earlier. FINDINGS: Portable AP upright view at 0553 hours. Lordotic positioning. Stable left chest dual lumen dialysis catheter. Mild cardiomegaly and mediastinal contours not significantly changed. Indistinct asymmetric increased lower lung combined interstitial and airspace opacity. No pneumothorax, pleural effusion, or air bronchograms. No acute osseous abnormality identified. IMPRESSION: Cardiomegaly and chronic left chest dialysis catheter. Asymmetric increased vague lower lung opacity suspicious for acute pulmonary edema in this setting. Bilateral respiratory infection felt less likely. Electronically Signed   By: Genevie Ann M.D.   On: 09/18/2022 06:15     Discharge Instructions: Discharge Instructions     Diet - low sodium heart healthy   Complete by: As directed    Discharge instructions   Complete by: As directed    Mr. Robbert, Langlinais were admitted due to elevated blood pressure from missing dialysis. I have sent a message to the Internal Medicine [facility] to have you follow-up within 7 days of discharge. The only medication change is your long acting insulin to 20 units nightly.  Be sure to follow-up with Cardiology on 1/19 at 10:30 and Infectious Disease on 2/7 at 2:30 I am glad you are feeling better! Dr. Howie Ill   Increase activity slowly   Complete by: As directed        Signed: Christiana Fuchs, DO 09/20/2022, 4:48 PM   Pager: [phone]

## 2022-09-19 NOTE — Progress Notes (Signed)
[facility] Progress Note  Subjective: seen in room. SOB "much better", feeling "great".  We walked in the halls w/ no O2 and sats were erratic, moving up and down between 85% and 95%. Would drop when he was talking. Weighed him standing at 116.9kg.   Vitals:   09/19/22 0400 09/19/22 0500 09/19/22 0600 09/19/22 0833  BP: 112/70  (!) 153/74 (!) 157/73  Pulse: 94  92 95  Resp: 15  13 19   Temp:    97.8 F (36.6 C)  TempSrc:    Oral  SpO2: 100%  100% 91%  Weight:  116.1 kg    Height:        Exam: Gen alert No jvd or bruits Chest bibasilar rales persist RRR no RG Abd soft ntnd no mass or ascites +bs Ext 1+ LE edema Neuro is alert, Ox 3 , nf    LIJ TDC intact      Home meds include- norvasc 10, hydralazine 100 tid, insulin aspart/ glargine, renavite, renvela 800 ac tid, prns/ vits/ supps        OP HD:  [facility]  4h  400/800   122.5kg   TDC  Hep 5000+ 4054midrun - last HD 1/06, post wt 123kg - hectorol 11 mcg IV tiw - no esa     Assessment/ Plan: AHRF/ pulm edema/ resp distress - missed HD on Tuesday. On bipap in ED. CXR bilat sig pulm edema due to vol overload. Had HD yest w/ 3.5 L UF, and is down 5-6kg under prior dry wt. Feels much better but SpO2's are not great w/ walking, exam suggesting still a bit wet. Recommend extra HD today to get more volume off ESRD - HD TTS. Had HD here yesterday w/ improvement from bipap to [place] O2. Plan extra HD today as above. Lower dry wt further.  HTN/ volume - vol overload, as above. BP's much better. Cont home meds Anemia esrd - Hb 12-13, no esa needs MBD ckd - CCa and phos in range. Cont IV vdra and renvela as binder.  DM2 NICM       Rob Delaynee Alred 09/19/2022, 11:01 AM   Recent Labs  Lab 09/18/22 0914 09/18/22 1834 09/19/22 0046  HGB 11.5*  --  9.9*  ALBUMIN 3.6  --  2.9*  CALCIUM 9.5  --  8.5*  PHOS 4.3 3.6 4.2  CREATININE 11.13*  --  8.04*  K 4.3  --  3.8   No results for input(s): "IRON", "TIBC",  "FERRITIN" in the last 168 hours. Inpatient medications:  amLODipine  10 mg Oral QHS   aspirin  324 mg Oral Once   atorvastatin  80 mg Oral Daily   carvedilol  37.5 mg Oral BID WC   Chlorhexidine Gluconate Cloth  6 each Topical Q0600   cloNIDine  0.3 mg Oral BID   ferric citrate  420 mg Oral TID   glipiZIDE  5 mg Oral QAC breakfast   heparin  5,000 Units Subcutaneous Q8H   hydrALAZINE  2 mg Intravenous Once   hydrALAZINE  100 mg Oral TID   insulin aspart  0-6 Units Subcutaneous TID WC   insulin aspart  10 Units Subcutaneous TID WC   insulin glargine-yfgn  37 Units Subcutaneous QHS   methocarbamol  500 mg Oral BID    acetaminophen, ondansetron (ZOFRAN) IV **OR** ondansetron

## 2022-09-19 NOTE — Plan of Care (Signed)

## 2022-09-19 NOTE — Hospital Course (Addendum)
Severe symptomatic hypertension Acute flash pulmonary edema Patient presented for acute dyspnea in setting of missed HD session. He was tachycardic, tachypneic and had severe HTN. CXT concerning for acute pulmonary edema. Labs notable for BNP 1173 With elevated troponin to 168 to 196. He was placed on Bipap and given IV diuresis with nitroglycerin infusion. Urgent HD completed with 3.5 L removal.  Acute respiratory failure with hypoxia Respiratory alkalosis  Metabolic acidosis  Patient was initially placed on nonrebreather without significant improvement and patient was unable to tolerate CPAP.  He was placed on BiPAP on arrival.  VBG showed respiratory alkalosis compensating for metabolic acidosis.  He was able to titrate to 6 L nasal cannula following for session of HD and titrate off of oxygen after second session day of discharge.  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  He was not able to complete his HD session on Tuesday due to line.  BUN/creatinine 68/10.96 but no signs of uremia.  No electrolyte abnormalities.  Hx E. Faecelis Bacteremia  Hx Native Aortic Valve Endocarditis  Patient mated in early November 2023 for night sweats and subjective fever and found to have A-fib Sowles bacteremia as well as negative for aortic valve endocarditis with presumed source from his tunneled HD catheter.  Catheter was replaced and he was discharged with vancomycin and gentamicin.  He has follow-up with cardiology in January and infectious disease in early February.  T2DM Last A1c 8.2% 2 months ago.  He requested to change Lantus dose to 20 units at bedtime and fasting glucose well-controlled with this dose. 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  Last TTE November 2023 showed EF 50 to 55% with moderate  aortic regurg.  BNP greater than thousand in setting of flash pulmonary edema due to missed dialysis session.  Received 2 sessions of HD.  Tropinemia In setting of flash pulmonary edema patient had pleuritic chest pain.'s were elevated 1 68-1 96.  EKG without any ischemic changes.  Troponin down trended to 150.  Elevated troponins in setting of demand ischemia due to flash pulmonary edema.  Anemia of chronic disease Iron deficiency anemia Hemoglobin 11.9 on admission.  Medications include Auryxia 420 mg TID.  Hyperlipidemia Lipid panel completed and showed LDL of 66. Medications include atorvastatin 80 mg.  He is at goal for secondary prevention less than 70.

## 2022-09-19 NOTE — Progress Notes (Signed)
Received patient in bed to unit.  Alert and oriented.  Informed consent signed and in chart.   Treatment initiated: 13:04 Treatment completed: 16:25  Patient tolerated well.  Transported back to the room  Alert, without acute distress.  Hand-off given to patient's nurse.   Access used: left [facility] Access issues: none  Total UF removed: 2.9L Medication(s) given: none    09/19/22 1625  Vitals  Temp 98.4 F (36.9 C)  Temp Source Oral  BP (!) 197/78  MAP (mmHg) 112  BP Location Left Arm  BP Method Automatic  Patient Position (if appropriate) Lying  Pulse Rate Source Monitor  ECG Heart Rate 100  Resp (!) 25  Oxygen Therapy  O2 Device Room Air  During Treatment Monitoring  Blood Flow Rate (mL/min) 400 mL/min  Arterial Pressure (mmHg) -100 mmHg  Venous Pressure (mmHg) 200 mmHg  TMP (mmHg) 17 mmHg  Ultrafiltration Rate (mL/min) 1059 mL/min  Dialysate Flow Rate (mL/min) 300 ml/min  HD Safety Checks Performed Yes  Intra-Hemodialysis Comments Tx completed  Post Treatment  Duration of HD Treatment -hour(s) 3.1 hour(s)  Liters Processed 76.1  Fluid Removed (mL) 2.9 mL  Tolerated HD Treatment Yes  Hemodialysis Catheter Left Subclavian  Placement Date/Time: (c) 09/18/22 1730   Placed prior to admission: Yes  Orientation: Left  Access Location: Subclavian  Site Condition No complications  Blue Lumen Status Flushed;Heparin locked;Dead end cap in place  Red Lumen Status Flushed;Heparin locked;Dead end cap in place  Catheter fill solution Heparin 1000 units/ml  Catheter fill volume (Arterial) 2.6 cc  Catheter fill volume (Venous) 2.6  Dressing Type Transparent  Dressing Status Antimicrobial disc in place  Drainage Description None      [facility]

## 2022-09-19 NOTE — TOC Progression Note (Incomplete)
Transition of Care [facility]) - Progression Note    Patient Details  Name: Jeremy [name] MRN: [id number] Date of Birth: [birth year]  [facility] CM/SW Contact  Zenon Mayo, RN Phone Number: 09/19/2022, 8:57 AM  Clinical Narrative:    From home, ESRD patient gets HD on TTHSAT, missed HD due to line issues. Became sob, presented with resp failure per MD note.          Expected Discharge Plan and Services                                               Social Determinants of Health (SDOH) Interventions SDOH Screenings   Food Insecurity: No Food Insecurity (07/16/2022)  Housing: Low Risk  (07/16/2022)  Transportation Needs: No Transportation Needs (07/16/2022)  Utilities: Not At Risk (07/16/2022)  Depression (PHQ2-9): Low Risk  (08/13/2022)  Physical Activity: Insufficiently Active (11/27/2021)  Stress: Stress Concern Present (11/27/2021)  Tobacco Use: Low Risk  (08/18/2022)    Readmission Risk Interventions     No data to display

## 2022-09-19 NOTE — Progress Notes (Addendum)
Subjective:   Summary: Jeremy [name] is a 42 y.o. year old male currently admitted on the IMTS HD#1 for acute respiratory failure with hypoxia.  Overnight Events: NOE  Patient was seen this a.m. bedside.  He was continuously asking when he was able to go home.  He denies any chest pain.  He says his breathing is better, and he was getting up and moving around without the oxygen just fine.  He states he missed his Saturday dialysis session as well as his Tuesday dialysis session because his line was not working properly.  He feels like he is back to his baseline.  He denies any fevers, chills, nausea, vomiting, lower extremity edema.  Bowel movements have been regular.   Objective:  Vital signs in last 24 hours: Vitals:   09/19/22 1300 09/19/22 1304 09/19/22 1313 09/19/22 1330  BP: (!) 141/68 (!) 144/64 (!) 144/68 (!) 141/68  Pulse:  90 91 87  Resp:  (!) 23 (!) 21 14  Temp:      TempSrc:      SpO2:  93% 90% 93%  Weight:      Height:       Supplemental O2: Room Air   Physical Exam:  Constitutional: well-appearing male, sitting comfortably in bed, in no acute distress Cardiovascular: RRR, no murmurs, rubs or gallops Pulmonary/Chest: Mild rales heard bilaterally Abdominal: soft, non-tender, non-distended Skin: warm and dry Extremities: upper/lower extremity pulses 2+, no lower extremity edema present  Filed Weights   09/18/22 0607 09/19/22 0500 09/19/22 1250  Weight: 120.2 kg 116.1 kg 116.1 kg     Intake/Output Summary (Last 24 hours) at 09/19/2022 1346 Last data filed at 09/19/2022 0600 Gross per 24 hour  Intake 44.9 ml  Output 575 ml  Net -530.1 ml   Net IO Since Admission: -530.1 mL [09/19/22 1346]  Pertinent Labs:    Latest Ref Rng & Units 09/19/2022   12:46 AM 09/18/2022    9:14 AM 09/18/2022    8:10 AM  CBC  WBC 4.0 - 10.5 K/uL 9.9  13.9    Hemoglobin 13.0 - 17.0 g/dL 9.9  11.5  12.9   Hematocrit 39.0 - 52.0 % 32.1  36.3  38.0   Platelets  150 - 400 K/uL 253  329         Latest Ref Rng & Units 09/19/2022   12:46 AM 09/18/2022    9:14 AM 09/18/2022    8:10 AM  CMP  Glucose 70 - 99 mg/dL 194  222    BUN 6 - 20 mg/dL 41  65    Creatinine 0.61 - 1.24 mg/dL 8.04  11.13    Sodium 135 - 145 mmol/L 138  140  142   Potassium 3.5 - 5.1 mmol/L 3.8  4.3  4.4   Chloride 98 - 111 mmol/L 103  106    CO2 22 - 32 mmol/L 25  21    Calcium 8.9 - 10.3 mg/dL 8.5  9.5       Assessment/Plan:   Principal Problem:   Flash pulmonary edema (HCC)   Patient Summary: Abigail Marsiglia is a 42 y.o. with a pertinent PMH of end-stage renal disease on hemodialysis, aortic valve endocarditis, heart failure with reduced ejection fraction, hypertension, hyperlipidemia, insulin-dependent type 2 diabetes, who presented with shortness of breath and admitted for severe symptomatic hypertension and acute flash pulmonary edema.    # Acute respiratory  failure with hypoxia #Acute flash pulmonary edema 2/2 Hypervolemia with Missed Dialysis Sessions #Respiratory alkalosis (improving) #Metabolic acidosis (improving) Patient initially presented with complaints of shortness of breath, chest x-ray revealed lower lung opacity concerning for acute pulmonary edema.  He was given a dose of IV Lasix 80 mg.  He is missed 2 of his last 3 dialysis appointments, on Tuesday and last Saturday due to line issues.  He is currently not any chest pain, and per chart review is breathing well on room air.  He received dialysis yesterday urgently, and since then he has been feeling much better.  They were able to pull 3.5 L UF, and is currently down 5 to 6 kg under his dry weight.  O2 saturations upon ambulation review to date of obesity 95%, with nephrology which helped out and he was talking.Initial pH on VBG was 7.5, repeat was 7.46. pCO2 improving as well.   Plan: - Due to patient desaturating with ambulation, will receive another dialysis session today. 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 He received one session yesterday, and another one today. Nephrology on board and appreciate recommendations.   #Hx E. Faecelis Bacteremia  #Hx Native Aortic Valve Endocarditis  Patient admitted in early November 2023 for night sweats and subjective fevers and found to have E faecalis bacteremia as well as negative aortic valve endocarditis with presumed source from his tunneled HD catheter. Patient's HD catheter was replaced and he was discharged home with a regimen of vancomycin and gentamicin to be administered during HD days until 08/28/2022 . He states he has completed antibiotics with his dialysis treatment. Last ID note was 08/13/2022, and he was supposed to follow up around 1/3 however do not believe it happened, f/u scheduled 10/15/22. He was supposed to follow up with CT surgery as well after he got his dental extractions, however unsure if this was done. Cardiology appt looks to be scheduled 09/26/22.   #T2DM Last A1c 8.2% 2 months ago. Home regimen includes Lantus 37 units at bedtime, NovoLog 17 units 3 times daily with meals and glipizide 5 mg daily before breakfast. CBG <130, will adjust insulin regimen as necessary.   Plan:  -Semglee 37 units at bedtime -NovoLog 10 units TID with meals -Glipizide 5 mg daily -SSI with CBG monitoring  #HFrEF: Pt's last TTE 07/2022 showed an EF of 50-55% and mild LVH. BNP on admission was over 1000, no crackles heard in lungs and no lower extremity edema. Lasix was given in the emergency department.   Plan:  - Continue coreg 37.5mg  BID  #Severe Symptomatic Hypertension (Resolved) Blood pressure medication has been resumed, and blood pressure has been stable ranging from 263-785 systolic.   Plan:  - Coreg 37.5mg  BID  - Clonidine .3mg  BID - Hydralazine 100mg  TID  #Tropninemia  Pt initially  presented with elevated troponin at 196, likely secondary to demand ischemia. Repeat troponins flat at 155.   Diet: Renal IVF: None,None VTE: Heparin Code: Full    Dispo: Anticipated discharge to Home in 0-1 days pending medical management.   Drucie Opitz, MD PGY-1 Internal Medicine Resident Pager Number [phone] Please contact the on call pager after 5 pm and on weekends at [phone].

## 2022-09-19 NOTE — Progress Notes (Signed)
Pt arrived back on the unit from HD. RN called to the room pt requested to call MD. States he is 'ready to go, has things to do and take care of", Pt states was told he go home after HD.  "Has kids at home he needs to get to." RN paged MD awaiting callback.

## 2022-09-19 NOTE — Progress Notes (Signed)
Case discussed with attending and nephrologist. Pt is for possible d/c later today if pt able to ambulate without desaturation after HD. Pt receives out-pt HD at [facility] on TTS 10:35 am chair time. Contacted clinic to make them aware that pt may d/c later today and resume care tomorrow morning on normal schedule. Clinic to place pt on the schedule for tomorrow. If pt d/c to home tomorrow morning, pt can receive out-pt HD at clinic with pt needing to arrive at 10:15 for 10:35 chair time.   Melven Sartorius Renal Navigator [phone]

## 2022-09-20 NOTE — TOC Transition Note (Signed)
Transition of care contact from inpatient facility  Date of discharge: 09/19/22 Date of contact: 09/20/22 Method: Phone Spoke to: Patient  Patient contacted to discuss transition of care from recent inpatient hospitalization. Patient was admitted to [facility] from 09/18/22-09/19/22 with discharge diagnosis of flash pulmonary edema 2nd missed hemodialysis treatments.  Medication changes were reviewed.  Unfortunately, patient did not go to scheduled HD today. Next HD on 09/23/22 at [facility].  Tobie Poet, NP

## 2022-09-21 ENCOUNTER — Other Ambulatory Visit (HOSPITAL_COMMUNITY): Payer: Self-pay

## 2022-09-22 ENCOUNTER — Other Ambulatory Visit (HOSPITAL_COMMUNITY): Payer: Self-pay

## 2022-09-22 DIAGNOSIS — E878 Other disorders of electrolyte and fluid balance, not elsewhere classified: Secondary | ICD-10-CM | POA: Diagnosis not present

## 2022-09-22 DIAGNOSIS — N2581 Secondary hyperparathyroidism of renal origin: Secondary | ICD-10-CM | POA: Diagnosis not present

## 2022-09-22 DIAGNOSIS — K7689 Other specified diseases of liver: Secondary | ICD-10-CM | POA: Diagnosis not present

## 2022-09-22 DIAGNOSIS — D631 Anemia in chronic kidney disease: Secondary | ICD-10-CM | POA: Diagnosis not present

## 2022-09-22 DIAGNOSIS — N2589 Other disorders resulting from impaired renal tubular function: Secondary | ICD-10-CM | POA: Diagnosis not present

## 2022-09-22 DIAGNOSIS — D509 Iron deficiency anemia, unspecified: Secondary | ICD-10-CM | POA: Diagnosis not present

## 2022-09-22 DIAGNOSIS — N185 Chronic kidney disease, stage 5: Secondary | ICD-10-CM | POA: Diagnosis not present

## 2022-09-22 DIAGNOSIS — R7881 Bacteremia: Secondary | ICD-10-CM | POA: Diagnosis not present

## 2022-09-22 DIAGNOSIS — D689 Coagulation defect, unspecified: Secondary | ICD-10-CM | POA: Diagnosis not present

## 2022-09-22 DIAGNOSIS — Z992 Dependence on renal dialysis: Secondary | ICD-10-CM | POA: Diagnosis not present

## 2022-09-22 DIAGNOSIS — Z79899 Other long term (current) drug therapy: Secondary | ICD-10-CM | POA: Diagnosis not present

## 2022-09-22 DIAGNOSIS — R17 Unspecified jaundice: Secondary | ICD-10-CM | POA: Diagnosis not present

## 2022-09-22 DIAGNOSIS — N186 End stage renal disease: Secondary | ICD-10-CM | POA: Diagnosis not present

## 2022-09-22 DIAGNOSIS — R82998 Other abnormal findings in urine: Secondary | ICD-10-CM | POA: Diagnosis not present

## 2022-09-22 DIAGNOSIS — E1129 Type 2 diabetes mellitus with other diabetic kidney complication: Secondary | ICD-10-CM | POA: Diagnosis not present

## 2022-09-22 DIAGNOSIS — R0602 Shortness of breath: Secondary | ICD-10-CM | POA: Diagnosis not present

## 2022-09-22 DIAGNOSIS — E440 Moderate protein-calorie malnutrition: Secondary | ICD-10-CM | POA: Diagnosis not present

## 2022-09-22 NOTE — Progress Notes (Signed)
Late Entry Note:  Contacted [facility] this morning to advise staff that pt was d/c on Friday. Appears pt did not go to treatment on Saturday per renal NP note on 1/13. Clinic advised pt should hopefully resume tomorrow.   Melven Sartorius  Renal Navigator [phone]

## 2022-09-24 ENCOUNTER — Other Ambulatory Visit (HOSPITAL_COMMUNITY): Payer: Self-pay

## 2022-09-25 ENCOUNTER — Telehealth (HOSPITAL_COMMUNITY): Payer: Self-pay

## 2022-09-25 DIAGNOSIS — K7689 Other specified diseases of liver: Secondary | ICD-10-CM | POA: Diagnosis not present

## 2022-09-25 DIAGNOSIS — R7881 Bacteremia: Secondary | ICD-10-CM | POA: Diagnosis not present

## 2022-09-25 DIAGNOSIS — R17 Unspecified jaundice: Secondary | ICD-10-CM | POA: Diagnosis not present

## 2022-09-25 DIAGNOSIS — E440 Moderate protein-calorie malnutrition: Secondary | ICD-10-CM | POA: Diagnosis not present

## 2022-09-25 DIAGNOSIS — N186 End stage renal disease: Secondary | ICD-10-CM | POA: Diagnosis not present

## 2022-09-25 DIAGNOSIS — E878 Other disorders of electrolyte and fluid balance, not elsewhere classified: Secondary | ICD-10-CM | POA: Diagnosis not present

## 2022-09-25 DIAGNOSIS — E1129 Type 2 diabetes mellitus with other diabetic kidney complication: Secondary | ICD-10-CM | POA: Diagnosis not present

## 2022-09-25 DIAGNOSIS — D631 Anemia in chronic kidney disease: Secondary | ICD-10-CM | POA: Diagnosis not present

## 2022-09-25 DIAGNOSIS — N185 Chronic kidney disease, stage 5: Secondary | ICD-10-CM | POA: Diagnosis not present

## 2022-09-25 DIAGNOSIS — R82998 Other abnormal findings in urine: Secondary | ICD-10-CM | POA: Diagnosis not present

## 2022-09-25 DIAGNOSIS — D689 Coagulation defect, unspecified: Secondary | ICD-10-CM | POA: Diagnosis not present

## 2022-09-25 DIAGNOSIS — N2589 Other disorders resulting from impaired renal tubular function: Secondary | ICD-10-CM | POA: Diagnosis not present

## 2022-09-25 DIAGNOSIS — D509 Iron deficiency anemia, unspecified: Secondary | ICD-10-CM | POA: Diagnosis not present

## 2022-09-25 DIAGNOSIS — Z992 Dependence on renal dialysis: Secondary | ICD-10-CM | POA: Diagnosis not present

## 2022-09-25 DIAGNOSIS — Z79899 Other long term (current) drug therapy: Secondary | ICD-10-CM | POA: Diagnosis not present

## 2022-09-25 DIAGNOSIS — N2581 Secondary hyperparathyroidism of renal origin: Secondary | ICD-10-CM | POA: Diagnosis not present

## 2022-09-25 NOTE — Telephone Encounter (Signed)
Called to confirm/remind patient of their appointment at the [facility] on 09/26/22.   Patient reminded to bring all medications and/or complete list.  Confirmed patient has transportation. Gave directions, instructed to utilize valet parking.  Confirmed appointment prior to ending call.   

## 2022-09-26 ENCOUNTER — Encounter (HOSPITAL_COMMUNITY): Payer: Self-pay

## 2022-09-26 ENCOUNTER — Inpatient Hospital Stay (HOSPITAL_COMMUNITY)
Admission: RE | Admit: 2022-09-26 | Discharge: 2022-09-26 | Disposition: A | Discharge status: Home / Self Care | Payer: Medicaid Other | Source: Ambulatory Visit | Attending: Cardiology | Admitting: Cardiology

## 2022-09-26 ENCOUNTER — Ambulatory Visit (HOSPITAL_COMMUNITY)
Admission: RE | Admit: 2022-09-26 | Discharge: 2022-09-26 | Disposition: A | Discharge status: Home / Self Care | Payer: Medicaid Other | Source: Ambulatory Visit | Attending: Family Medicine | Admitting: Family Medicine

## 2022-09-26 VITALS — BP 102/52 | HR 82 | Ht 77.0 in | Wt 252.0 lb

## 2022-09-26 DIAGNOSIS — N186 End stage renal disease: Secondary | ICD-10-CM | POA: Diagnosis not present

## 2022-09-26 DIAGNOSIS — I351 Nonrheumatic aortic (valve) insufficiency: Secondary | ICD-10-CM

## 2022-09-26 DIAGNOSIS — E785 Hyperlipidemia, unspecified: Secondary | ICD-10-CM | POA: Diagnosis not present

## 2022-09-26 DIAGNOSIS — E1122 Type 2 diabetes mellitus with diabetic chronic kidney disease: Secondary | ICD-10-CM | POA: Insufficient documentation

## 2022-09-26 DIAGNOSIS — Z79899 Other long term (current) drug therapy: Secondary | ICD-10-CM | POA: Diagnosis not present

## 2022-09-26 DIAGNOSIS — R002 Palpitations: Secondary | ICD-10-CM | POA: Diagnosis not present

## 2022-09-26 DIAGNOSIS — I5032 Chronic diastolic (congestive) heart failure: Secondary | ICD-10-CM

## 2022-09-26 DIAGNOSIS — M5136 Other intervertebral disc degeneration, lumbar region: Secondary | ICD-10-CM | POA: Diagnosis not present

## 2022-09-26 DIAGNOSIS — I132 Hypertensive heart and chronic kidney disease with heart failure and with stage 5 chronic kidney disease, or end stage renal disease: Secondary | ICD-10-CM | POA: Diagnosis not present

## 2022-09-26 DIAGNOSIS — K047 Periapical abscess without sinus: Secondary | ICD-10-CM | POA: Insufficient documentation

## 2022-09-26 DIAGNOSIS — I330 Acute and subacute infective endocarditis: Secondary | ICD-10-CM

## 2022-09-26 DIAGNOSIS — I428 Other cardiomyopathies: Secondary | ICD-10-CM | POA: Insufficient documentation

## 2022-09-26 DIAGNOSIS — I10 Essential (primary) hypertension: Secondary | ICD-10-CM | POA: Diagnosis not present

## 2022-09-26 DIAGNOSIS — B952 Enterococcus as the cause of diseases classified elsewhere: Secondary | ICD-10-CM | POA: Diagnosis not present

## 2022-09-26 NOTE — Patient Instructions (Addendum)
Thank you for coming in today  You have referred to Dental clinic for further evaluation this is needed to be able to get a follow up appointment with cardiothoracic surgeon. Their office stated that you needed to dental clearance to be seen.  Your provider has recommended that  you wear a Zio Patch for 14 days.  This monitor will record your heart rhythm for our review.  IF you have any symptoms while wearing the monitor please press the button.  If you have any issues with the patch or you notice a red or orange light on it please call the company at [phone].  Once you remove the patch please mail it back to the company as soon as possible so we can get the results.   Your physician recommends that you schedule a follow-up appointment in:  3 months with Dr. Kendall Flack will receive a reminder letter in the mail a few months in advance. If you don't receive a letter, please call our office to schedule the follow-up appointment.     Do the following things EVERYDAY: Weigh yourself in the morning before breakfast. Write it down and keep it in a log. Take your medicines as prescribed Eat low salt foods--Limit salt (sodium) to 2000 mg per day.  Stay as active as you can everyday Limit all fluids for the day to less than 2 liters  At the [facility], you and your health needs are our priority. As part of our continuing mission to provide you with exceptional heart care, we have created designated Provider Care Teams. These Care Teams include your primary Cardiologist (physician) and Advanced Practice Providers (APPs- Physician Assistants and Nurse Practitioners) who all work together to provide you with the care you need, when you need it.   You may see any of the following providers on your designated Care Team at your next follow up: Dr Glori Bickers Dr Loralie Champagne Dr. Roxana Hires, NP Lyda Jester, [place] [facility] [place],  [place] Forestine Na, NP Audry Riles, PharmD   Please be sure to bring in all your medications bottles to every appointment.   If you have any questions or concerns before your next appointment please send [place] a message through [place] or call our office at [phone].    TO LEAVE A MESSAGE FOR THE NURSE SELECT OPTION 2, PLEASE LEAVE A MESSAGE INCLUDING: YOUR NAME DATE OF BIRTH CALL BACK NUMBER REASON FOR CALL**this is important as we prioritize the call backs  YOU WILL RECEIVE A CALL BACK THE SAME DAY AS LONG AS YOU CALL BEFORE 4:00 PM

## 2022-09-26 NOTE — Progress Notes (Signed)
Patient ID: Jeremy [name], male   DOB: [birth year], 42 y.o.   MRN: [id number]    [facility] Clinic Note   PCP: Dr. Randell Patient Nephrology: [facility] HF Cardiologist: Dr. Aundra Dubin  42 y.o. with history of nonischemic cardiomyopathy, HTN, and DM presents for cardiology followup.  In 4/16, patient was seen in the ER with edema and chest pain.  The only medication he was taking was metformin.  BP was elevated.  Echo showed EF 20-25%, and Cardiolite suggested ischemia.  He had RHC/LHC in 5/16.  This showed no coronary disease and mildly elevated filling pressures.    Admitted 9/21 through 06/03/16 with marked volume overload and hypertensive crisis. Diuresed with IV lasix and transitioned to lasix 160 mg twice daily. Due to worsening renal function, nephrology consulted. Work up consistent with nephrotic syndrome. 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  Discharge creatinine 3.42.  He has been following with renal. EF improved to 55% on 9/17 echo.    Echo in 5/19 showed EF 50-55%, moderate LVH, normal RV.   Echo in 10/20 showed EF up to 55-60%, normal RV.   He was admitted in 2/21 with COVID-19 infection and developed AKI proceeding to ESRD.  He was able to come off HD eventually, and was been off HD for almost a year.  However, creatinine is worsening, and he is going to be going back on HD in August.   Follow up 7/22, NYHA II and volume stable. Unfortunately lost to follow up.  Admitted 11/23 with E faecalis bacteremia, source felt to be right IJ catheter. Echo showed EF 50-55%, grade II DD, RV norma, moderate AI with ? vegetation. TEE showed normal LV function, borderline severe AI, concern for AoV leaflet perforation 2/2 infective endocarditis. ID consulted, he was started on IV ampicillin/ceftriaxone and his dialysis catheter was removed. CVTS consulted and recommended AVR. Planned to be cleared by dental surgery before considering AVR. Dental surgeon  saw him and mad recs to remove at least 8 teeth. CTA showed no evidence of CAD, coronary calcium score of ), normal cors with right dominance, AoV not well visualized 2/2 endocarditis. After his line holiday, tunneled catheter placed for dialysis. He was then discharged home, with plans to follow up with CVTS after teeth extraction. Re-admitted 1/24 with acute dyspnea after missing HD. Placed on BiPap and IV nitro gtt. Had urgent dialysis with resolution of symptoms.  Today he returns for HF follow up. Overall feeling fine. Feels occasional palpitations, has SOB if his BP is too high. 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 Denies abnormal bleeding, CP, dizziness, edema, or PND/Orthopnea. Appetite ok. No fever or chills. Dry weight 114 kg. Taking all medications. He still makes urine. iHD on T/TH/Sat schedule, no issues with low BP at dialysis. Has not seen Buyer, retail.  ECG (personally reviewed): NSR, PAC 80 bpm  Labs (6/16): LDL 308, HDL 46, TGs 284 Labs (7/16): K 3.5, creatinine 1.0 Labs 05/24/2015: K 3.9 Creatinine 1.43  Labs (3/17): creatinine 1.55, LDL 108, HDL 31 Labs (9/17): hemoglobin 8.3, K 4, BUN 40, creatinine 3.69, BNP 56 Labs (06/03/2016) : K 4.6 Creatinine 3.42  Labs (10/17): K 4.4, creatinine 3.44, hgb 9.4 Labs (1/18): K 4.9, creatinine 1.85 Labs (4/19): K 4.6, creatinine 2.14 Labs (8/20): K 4.4, creatinine 2.81 Labs (2/22): LDL 99  PMH: 1. Type II diabetes since 2006.  2. HTN since around 2010.   3. Asthma 4. Hyperlipidemia 5. Morbid obesity 6. Cardiomyopathy: Nonischemic.  Echo (5/16) with EF 20-25%, diffuse hypokinesis, grade II diastolic dysfunction.  Lexiscan Cardiolite (5/16) with EF 28%, inferior/inferolateral/apical ischemia.  RHC/LHC (5/16) with normal coronaries, mean RA 11, PA 33/21 mean 28, mean PCWP 18, CI 2.53.  - Echo (4/17): EF 50%, moderate LVH, normal RV size and systolic function. Grade IDD  - Echo (9/17): EF 55%.  - Echo (5/19): EF 50-55%, moderate LVH,  normal RV size and systolic function.  - Echo (10/20): EF 55-60%, normal RV.  - Echo (11/23): EF 50-55%, grade II DD, normal RV, moderate AI w/ ? vegetation 7. ABIs normal 8/16.  8. ESRD: AKI in 9/17 with diagnosis of nephrotic syndrome. 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  9. COVID-19 infection 2/21.  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   SH: Lives with girlfriend, nonsmoker, no cocaine, occasional marijuana, no ETOH.  Works as Art gallery manager.  FH: Father with CHF, died at 78.  Sister with CHF, PAD.   Review of systems complete and found to be negative unless listed in HPI.    Current Outpatient Medications  Medication Sig Dispense Refill   Accu-Chek Softclix Lancets lancets Use as directed to check blood sugar four times daily. 400 each 1   acetaminophen (TYLENOL) 500 MG tablet Take 1,000 mg by mouth every 6 (six) hours as needed for moderate pain.     amLODipine (NORVASC) 10 MG tablet Take 1 tablet (10 mg total) by mouth daily. 90 tablet 3   atorvastatin (LIPITOR) 80 MG tablet Take 1 tablet (80 mg total) by mouth daily. NEEDS FOLLOW UP APPOINTMENT FOR ANYMORE REFILLS 30 tablet 0   AURYXIA 1 GM 210 MG(Fe) tablet Take 420 mg by mouth 3 (three) times daily.     blood glucose meter kit and supplies KIT Dispense based on patient and insurance preference. Use up to four times daily as directed. (FOR ICD-9 250.00, 250.01). 1 each 0   Blood Glucose Monitoring Suppl (ACCU-CHEK GUIDE) w/Device KIT Use to test blood sugar 1 kit 0   Blood Pressure Monitoring (FORA P20 BLOOD PRESSURE CUFF) MISC Check your blood pressure everyday in the morning. 1 each 0   carvedilol (COREG) 25 MG tablet Take 1.5 tablets (37.5 mg total) by mouth 2 (two) times daily with a meal. Need to schedule an appointment for further refills 270 tablet 0    cloNIDine (CATAPRES) 0.3 MG tablet Take 1 tablet (0.3 mg total) by mouth 2 (two) times daily. 60 tablet 0   Doxercalciferol (HECTOROL IV) Inject 1 Dose into the vein Every Tuesday,Thursday,and Saturday with dialysis.     glipiZIDE (GLUCOTROL) 5 MG tablet Take 1 tablet (5 mg total) by mouth daily before breakfast. 90 tablet 1   glucose blood (ACCU-CHEK GUIDE) test strip USE TO CHECK BLOOD SUGAR 4 TIMES DAILY 400 strip 4   hydrALAZINE (APRESOLINE) 100 MG tablet Take 1 tablet (100 mg total) by mouth 3 (three) times daily. 180 tablet 5   insulin aspart (NOVOLOG FLEXPEN) 100 UNIT/ML FlexPen Inject 15 Units into the skin 3 (three) times daily with meals. (Patient taking differently: Inject 17 Units into the skin 3 (three) times daily with meals.) 39 mL 3   insulin glargine-yfgn (SEMGLEE, YFGN,) 100 UNIT/ML Pen Inject 20 Units into the skin at bedtime. 15 mL 11   Insulin Pen Needle (PEN NEEDLES) 31G X 5 MM MISC Use four times daily with  insulin. 300 each 2   Insulin Syringe-Needle U-100 (ULTICARE INSULIN SYRINGE) 30G X 1/2" 0.5 ML MISC USE 4 TIMES DAILY WITH INSULIN INJECTIONS. 100 each 3   multivitamin (RENA-VIT) TABS tablet Take 1 tablet by mouth daily.     ondansetron (ZOFRAN) 4 MG tablet Take 1 tablet (4 mg total) by mouth daily as needed. 30 tablet 11   RENVELA 800 MG tablet Take 800 mg by mouth daily.     nitroGLYCERIN (NITROSTAT) 0.4 MG SL tablet PLACE 1 TABLET UNDER THE TONGUE EVERY 5 MINUTES AS NEEDED FOR CHEST PAIN. (Patient not taking: Reported on 09/26/2022) 25 tablet 3   No current facility-administered medications for this encounter.   BP (!) 102/52   Pulse 82   Ht 6\' 5"  (1.956 m)   Wt 114.3 kg (252 lb)   SpO2 100%   BMI 29.88 kg/m   Wt Readings from Last 3 Encounters:  09/26/22 114.3 kg (252 lb)  09/19/22 116.1 kg (255 lb 15.3 oz)  08/13/22 125.6 kg (277 lb)   Physical Exam General:  NAD. No resp difficulty HEENT: Normal Neck: Supple. No JVD. Carotids 2+ bilat; no bruits. No  lymphadenopathy or thryomegaly appreciated. Cor: PMI nondisplaced. Regular rate & rhythm. No rubs, gallops, 2/6 diastolic murmur RUSB Lungs: Clear, L tunneled HD catheter looks OK. Abdomen: Soft, nontender, nondistended. No hepatosplenomegaly. No bruits or masses. Good bowel sounds. Extremities: No cyanosis, clubbing, rash, edema Neuro: Alert & oriented x 3, cranial nerves grossly intact. Moves all 4 extremities w/o difficulty. Affect pleasant.  Assessment/Plan:  1. Chronic diastolic CHF: Nonischemic cardiomyopathy, ?from long-standing HTN.  EF was 20-25% in 2006 but EF has improved.  Echo in 10/20 showed EF 55-60% with normal RV systolic function. Echo 11/23 showed EF 50-55%, grade II DD, RV norma, moderate AI with ? vegetation. Today, NYHA class II.  Volume is managed by HD. - Continue Coreg 37.5 mg bid. - Continue hydralazine 100 mg tid. 2. AoV endocarditis, with severe AI: Treated for E faecalis bacteremia (11/23) 2/2 infected IJ HD catheter. TEE showed severe AI. Seen by CVTS and planning for AVR, but needs several dental extractions and 2 weeks of IV abx beforehand. Also concern for early discitis and osteomyelitis on MRI. He received 6 weeks of IV Vanc/Gent at dialysis. He has not followed up with dental surgery, will refer today. (Dr. Benson Norway no longer in practice). No fevers or chills.  - He is followed by Dr. Gale Journey, will need to decided if further abx is needed based on valve surgery. 3.  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   - Continue atorvastatin.  4. HTN: BP well controlled.   - Continue current medications. 5. 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 Had ESRD on HD after COVID-19 infection, was able to go off HD for about a year, but now back on iHD on T/TH/Sat schedule.  6. Palpitations: Place 2 week Zio. 7. Dental abscess: Refer to dental surgery. Needs dental clearance before he can move forward with CVTS. 8. Lumbar degenerative  disc disease: There was concern for early discitis and osteomyelitis on MRI during 11/23 admission. As above, he completed 6 weeks of IV Vanc/Gent. No back pain today.  Follow up in 3 months with Dr. Aundra Dubin, may need to change depending surgery date. Will notify Dr. Prescott Gum of visit today as well.  L[place], FNP  09/26/2022

## 2022-09-27 DIAGNOSIS — N2581 Secondary hyperparathyroidism of renal origin: Secondary | ICD-10-CM | POA: Diagnosis not present

## 2022-09-27 DIAGNOSIS — N186 End stage renal disease: Secondary | ICD-10-CM | POA: Diagnosis not present

## 2022-09-27 DIAGNOSIS — N185 Chronic kidney disease, stage 5: Secondary | ICD-10-CM | POA: Diagnosis not present

## 2022-09-27 DIAGNOSIS — R82998 Other abnormal findings in urine: Secondary | ICD-10-CM | POA: Diagnosis not present

## 2022-09-27 DIAGNOSIS — E1129 Type 2 diabetes mellitus with other diabetic kidney complication: Secondary | ICD-10-CM | POA: Diagnosis not present

## 2022-09-27 DIAGNOSIS — D689 Coagulation defect, unspecified: Secondary | ICD-10-CM | POA: Diagnosis not present

## 2022-09-27 DIAGNOSIS — N2589 Other disorders resulting from impaired renal tubular function: Secondary | ICD-10-CM | POA: Diagnosis not present

## 2022-09-27 DIAGNOSIS — D631 Anemia in chronic kidney disease: Secondary | ICD-10-CM | POA: Diagnosis not present

## 2022-09-27 DIAGNOSIS — K7689 Other specified diseases of liver: Secondary | ICD-10-CM | POA: Diagnosis not present

## 2022-09-27 DIAGNOSIS — Z992 Dependence on renal dialysis: Secondary | ICD-10-CM | POA: Diagnosis not present

## 2022-09-27 DIAGNOSIS — E878 Other disorders of electrolyte and fluid balance, not elsewhere classified: Secondary | ICD-10-CM | POA: Diagnosis not present

## 2022-09-27 DIAGNOSIS — D509 Iron deficiency anemia, unspecified: Secondary | ICD-10-CM | POA: Diagnosis not present

## 2022-09-27 DIAGNOSIS — R7881 Bacteremia: Secondary | ICD-10-CM | POA: Diagnosis not present

## 2022-09-27 DIAGNOSIS — Z79899 Other long term (current) drug therapy: Secondary | ICD-10-CM | POA: Diagnosis not present

## 2022-09-27 DIAGNOSIS — R17 Unspecified jaundice: Secondary | ICD-10-CM | POA: Diagnosis not present

## 2022-09-27 DIAGNOSIS — E440 Moderate protein-calorie malnutrition: Secondary | ICD-10-CM | POA: Diagnosis not present

## 2022-09-30 ENCOUNTER — Other Ambulatory Visit (HOSPITAL_COMMUNITY): Payer: Self-pay

## 2022-09-30 DIAGNOSIS — Z992 Dependence on renal dialysis: Secondary | ICD-10-CM | POA: Diagnosis not present

## 2022-09-30 DIAGNOSIS — N2581 Secondary hyperparathyroidism of renal origin: Secondary | ICD-10-CM | POA: Diagnosis not present

## 2022-09-30 DIAGNOSIS — D509 Iron deficiency anemia, unspecified: Secondary | ICD-10-CM | POA: Diagnosis not present

## 2022-09-30 DIAGNOSIS — N186 End stage renal disease: Secondary | ICD-10-CM | POA: Diagnosis not present

## 2022-09-30 DIAGNOSIS — D689 Coagulation defect, unspecified: Secondary | ICD-10-CM | POA: Diagnosis not present

## 2022-09-30 DIAGNOSIS — R7881 Bacteremia: Secondary | ICD-10-CM | POA: Diagnosis not present

## 2022-09-30 MED ORDER — CLONIDINE HCL 0.3 MG PO TABS
0.3000 mg | ORAL_TABLET | Freq: Two times a day (BID) | ORAL | 4 refills | Status: DC
  Filled 2022-09-30: qty 180, 90d supply, fill #0
  Filled 2023-01-12: qty 180, 90d supply, fill #1

## 2022-10-01 ENCOUNTER — Encounter: Payer: Self-pay | Admitting: Student

## 2022-10-01 ENCOUNTER — Ambulatory Visit (INDEPENDENT_AMBULATORY_CARE_PROVIDER_SITE_OTHER): Payer: Medicaid Other | Admitting: Student

## 2022-10-01 ENCOUNTER — Other Ambulatory Visit (HOSPITAL_COMMUNITY): Payer: Self-pay

## 2022-10-01 VITALS — BP 133/53 | HR 86 | Temp 97.7°F | Ht 77.0 in | Wt 253.7 lb

## 2022-10-01 DIAGNOSIS — K047 Periapical abscess without sinus: Secondary | ICD-10-CM | POA: Diagnosis not present

## 2022-10-01 DIAGNOSIS — K056 Periodontal disease, unspecified: Secondary | ICD-10-CM | POA: Diagnosis not present

## 2022-10-01 DIAGNOSIS — I5020 Unspecified systolic (congestive) heart failure: Secondary | ICD-10-CM

## 2022-10-01 DIAGNOSIS — L0292 Furuncle, unspecified: Secondary | ICD-10-CM | POA: Diagnosis not present

## 2022-10-01 DIAGNOSIS — I10 Essential (primary) hypertension: Secondary | ICD-10-CM

## 2022-10-01 DIAGNOSIS — Z794 Long term (current) use of insulin: Secondary | ICD-10-CM

## 2022-10-01 DIAGNOSIS — L84 Corns and callosities: Secondary | ICD-10-CM | POA: Diagnosis not present

## 2022-10-01 DIAGNOSIS — N492 Inflammatory disorders of scrotum: Secondary | ICD-10-CM

## 2022-10-01 DIAGNOSIS — I110 Hypertensive heart disease with heart failure: Secondary | ICD-10-CM

## 2022-10-01 DIAGNOSIS — E1121 Type 2 diabetes mellitus with diabetic nephropathy: Secondary | ICD-10-CM | POA: Diagnosis not present

## 2022-10-01 MED ORDER — GLIPIZIDE 5 MG PO TABS
5.0000 mg | ORAL_TABLET | Freq: Every day | ORAL | 1 refills | Status: DC
  Filled 2022-10-01: qty 90, 90d supply, fill #0
  Filled 2022-12-26: qty 90, 90d supply, fill #1

## 2022-10-01 MED ORDER — DOXYCYCLINE HYCLATE 50 MG PO CAPS
100.0000 mg | ORAL_CAPSULE | Freq: Two times a day (BID) | ORAL | 0 refills | Status: AC
  Filled 2022-10-01: qty 28, 7d supply, fill #0

## 2022-10-01 NOTE — Patient Instructions (Signed)
We will see you next week to check your glucometer.

## 2022-10-03 DIAGNOSIS — N2581 Secondary hyperparathyroidism of renal origin: Secondary | ICD-10-CM | POA: Diagnosis not present

## 2022-10-03 DIAGNOSIS — N186 End stage renal disease: Secondary | ICD-10-CM | POA: Diagnosis not present

## 2022-10-03 DIAGNOSIS — D689 Coagulation defect, unspecified: Secondary | ICD-10-CM | POA: Diagnosis not present

## 2022-10-03 DIAGNOSIS — Z992 Dependence on renal dialysis: Secondary | ICD-10-CM | POA: Diagnosis not present

## 2022-10-03 DIAGNOSIS — D509 Iron deficiency anemia, unspecified: Secondary | ICD-10-CM | POA: Diagnosis not present

## 2022-10-03 DIAGNOSIS — R7881 Bacteremia: Secondary | ICD-10-CM | POA: Diagnosis not present

## 2022-10-03 NOTE — Assessment & Plan Note (Signed)
Patient previously had boil of his scrotum. Currently has scrotal boil. Will see if boil self drains over the next two -three days and treat with doxycycline. Will also send referral to urology for possible I&D.

## 2022-10-03 NOTE — Assessment & Plan Note (Signed)
Patient referred to [facility].

## 2022-10-03 NOTE — Assessment & Plan Note (Addendum)
Patient did not bring his glucometer but states that his blood sugars are mostly in the 140-150 range. He is currently on semeglee 20u qhs, mealtime insulin 17u TID, and glipizide 5mg .  -Patient will bring glucometer next week and will adjust insulin at that time. Can likely discontinue glipizide at that time.  -Sent referral to ophthalmology and podiatry

## 2022-10-03 NOTE — Assessment & Plan Note (Signed)
Today's Vitals   10/01/22 1404  BP: (!) 133/53  Pulse: 86  Temp: 97.7 F (36.5 C)  TempSrc: Oral  SpO2: 100%  Weight: 253 lb 11.2 oz (115.1 kg)  Height: 6\' 5"  (1.956 m)  PainSc: 8   PainLoc: Foot   Body mass index is 30.08 kg/m. Continue medications as noted under HF section.

## 2022-10-03 NOTE — Progress Notes (Signed)
   CC: F/u after recent hospitalization   HPI:  [name] is a 42 y.o. M with PMH per below. He presents for follow up after recent hospitalization for severe symptomatic hypotension with acute flash pulmonary edema.  Patient states that he currently does not have any chest pain or shortness of breath.  He does note that he needs another blood pressure cuff.  Patient also reports that for the past couple days he has noticed a scrotal mass.  He states that he has had this before and it is usually boil that ultimately self drains. He notes some discomfort in the area but denies any fevers or chills and notes no erythema of the overlying skin.    Patient has known aortic valve vegetation and has plans for AVR. However, patient has to have a dental abscess removed prior to this.   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 01/2015 Lexiscan MV: EF 28%, inferior, inferolateral, apical ischemia;  b. 01/2015 Cath: nl cors, PCWP 18 mmHg, CO 9.38 L/min, CI 3.53 L/min/m^2.   CHF (congestive heart failure) (HCC)    Depression    Situational   ESRD (end stage renal disease) ([place])    TTHS- [place]   Essential hypertension    Family history of adverse reaction to anesthesia    sister- "it was too much for her heart" died   GERD (gastroesophageal reflux disease)    Hyperlipidemia    Membranous glomerulonephritis    Morbid obesity ([place])    Nonischemic cardiomyopathy ([place])    a. 01/2015 Echo: EF 20-25%, diff HK, Gr 2 DD, Triv AI, mildly dil LA and Ao root.   PONV (postoperative nausea and vomiting)    Seizures ([place]) 02/29/2020   "electralytes were out of wack."   Type II diabetes mellitus ([place])    a. 01/2015 HbA1c = 8.9.   Review of Systems:  Negative except per above  Physical Exam:  Vitals:   10/01/22 1404  BP: (!)  133/53  Pulse: 86  Temp: 97.7 F (36.5 C)  TempSrc: Oral  SpO2: 100%  Weight: 253 lb 11.2 oz (115.1 kg)  Height: 6\' 5"  (1.956 m)   Constitutional: Well-developed, well-nourished, and in no distress.  Cardiovascular: Normal rate, regular rhythm, intact distal pulses. No gallop and no friction rub.  No murmur heard. No lower extremity edema  Pulmonary: Non labored breathing on room air, no wheezing or rales  Abdominal: Soft. Normal bowel sounds. Non distended and non tender Musculoskeletal: Normal range of motion.        General: No tenderness or edema.  Neurological: Alert and oriented to person, place, and time. Non focal  Skin: Skin is warm and dry.  GU: Scrotal mass on R aspect of scrotal sac, appears to be superficial, fluctuant, overlying skin with no erythema   Assessment & Plan:   See Encounters Tab for problem based charting.  Patient discussed with Dr.  Saverio Danker

## 2022-10-03 NOTE — Assessment & Plan Note (Signed)
Euvolemic on exam.   TEE 07/2022. EF 55-60% RV w/ normal sys fxn. Borderline severe AI w/ c/f Aortic vegetation noted. SVC thrombus noted.  -Continue HD for volume control, clonidine 0.3mg  BID, coreg 37.5mg  BID, amlodipine 10mg  qd, and hydralazine 100mg  TID for blood pressure control

## 2022-10-06 NOTE — Progress Notes (Signed)
Internal Medicine Clinic Attending  Case discussed with Dr. Carter  At the time of the visit.  We reviewed the resident's history and exam and pertinent patient test results.  I agree with the assessment, diagnosis, and plan of care documented in the resident's note.  

## 2022-10-07 DIAGNOSIS — D509 Iron deficiency anemia, unspecified: Secondary | ICD-10-CM | POA: Diagnosis not present

## 2022-10-07 DIAGNOSIS — N186 End stage renal disease: Secondary | ICD-10-CM | POA: Diagnosis not present

## 2022-10-07 DIAGNOSIS — D689 Coagulation defect, unspecified: Secondary | ICD-10-CM | POA: Diagnosis not present

## 2022-10-07 DIAGNOSIS — N2581 Secondary hyperparathyroidism of renal origin: Secondary | ICD-10-CM | POA: Diagnosis not present

## 2022-10-07 DIAGNOSIS — Z992 Dependence on renal dialysis: Secondary | ICD-10-CM | POA: Diagnosis not present

## 2022-10-07 DIAGNOSIS — R7881 Bacteremia: Secondary | ICD-10-CM | POA: Diagnosis not present

## 2022-10-08 ENCOUNTER — Ambulatory Visit: Payer: Medicaid Other | Admitting: Podiatry

## 2022-10-08 ENCOUNTER — Encounter: Payer: Medicaid Other | Admitting: Student

## 2022-10-08 DIAGNOSIS — E1129 Type 2 diabetes mellitus with other diabetic kidney complication: Secondary | ICD-10-CM | POA: Diagnosis not present

## 2022-10-08 DIAGNOSIS — N186 End stage renal disease: Secondary | ICD-10-CM | POA: Diagnosis not present

## 2022-10-08 DIAGNOSIS — Z992 Dependence on renal dialysis: Secondary | ICD-10-CM | POA: Diagnosis not present

## 2022-10-09 DIAGNOSIS — D509 Iron deficiency anemia, unspecified: Secondary | ICD-10-CM | POA: Diagnosis not present

## 2022-10-09 DIAGNOSIS — R7881 Bacteremia: Secondary | ICD-10-CM | POA: Diagnosis not present

## 2022-10-09 DIAGNOSIS — Z992 Dependence on renal dialysis: Secondary | ICD-10-CM | POA: Diagnosis not present

## 2022-10-09 DIAGNOSIS — N186 End stage renal disease: Secondary | ICD-10-CM | POA: Diagnosis not present

## 2022-10-09 DIAGNOSIS — N2581 Secondary hyperparathyroidism of renal origin: Secondary | ICD-10-CM | POA: Diagnosis not present

## 2022-10-11 DIAGNOSIS — Z992 Dependence on renal dialysis: Secondary | ICD-10-CM | POA: Diagnosis not present

## 2022-10-11 DIAGNOSIS — N2581 Secondary hyperparathyroidism of renal origin: Secondary | ICD-10-CM | POA: Diagnosis not present

## 2022-10-11 DIAGNOSIS — D509 Iron deficiency anemia, unspecified: Secondary | ICD-10-CM | POA: Diagnosis not present

## 2022-10-11 DIAGNOSIS — R7881 Bacteremia: Secondary | ICD-10-CM | POA: Diagnosis not present

## 2022-10-11 DIAGNOSIS — N186 End stage renal disease: Secondary | ICD-10-CM | POA: Diagnosis not present

## 2022-10-13 ENCOUNTER — Other Ambulatory Visit (HOSPITAL_COMMUNITY): Payer: Self-pay

## 2022-10-14 ENCOUNTER — Other Ambulatory Visit: Payer: Self-pay

## 2022-10-14 ENCOUNTER — Other Ambulatory Visit (HOSPITAL_COMMUNITY): Payer: Self-pay

## 2022-10-14 DIAGNOSIS — D689 Coagulation defect, unspecified: Secondary | ICD-10-CM | POA: Diagnosis not present

## 2022-10-14 DIAGNOSIS — N186 End stage renal disease: Secondary | ICD-10-CM | POA: Diagnosis not present

## 2022-10-14 DIAGNOSIS — D509 Iron deficiency anemia, unspecified: Secondary | ICD-10-CM | POA: Diagnosis not present

## 2022-10-14 DIAGNOSIS — N2581 Secondary hyperparathyroidism of renal origin: Secondary | ICD-10-CM | POA: Diagnosis not present

## 2022-10-14 DIAGNOSIS — R7881 Bacteremia: Secondary | ICD-10-CM | POA: Diagnosis not present

## 2022-10-14 DIAGNOSIS — Z992 Dependence on renal dialysis: Secondary | ICD-10-CM | POA: Diagnosis not present

## 2022-10-14 MED ORDER — AURYXIA 1 GM 210 MG(FE) PO TABS
420.0000 mg | ORAL_TABLET | Freq: Every day | ORAL | 11 refills | Status: DC
  Filled 2023-03-19: qty 300, 30d supply, fill #0

## 2022-10-14 MED ORDER — AURYXIA 1 GM 210 MG(FE) PO TABS
420.0000 mg | ORAL_TABLET | Freq: Every day | ORAL | 11 refills | Status: DC
  Filled 2022-10-14: qty 300, 30d supply, fill #0
  Filled 2023-01-02: qty 300, 30d supply, fill #1

## 2022-10-15 ENCOUNTER — Ambulatory Visit: Payer: Medicaid Other | Admitting: Internal Medicine

## 2022-10-16 ENCOUNTER — Other Ambulatory Visit (HOSPITAL_COMMUNITY): Payer: Self-pay

## 2022-10-17 DIAGNOSIS — Z992 Dependence on renal dialysis: Secondary | ICD-10-CM | POA: Diagnosis not present

## 2022-10-17 DIAGNOSIS — N2581 Secondary hyperparathyroidism of renal origin: Secondary | ICD-10-CM | POA: Diagnosis not present

## 2022-10-17 DIAGNOSIS — D689 Coagulation defect, unspecified: Secondary | ICD-10-CM | POA: Diagnosis not present

## 2022-10-17 DIAGNOSIS — R7881 Bacteremia: Secondary | ICD-10-CM | POA: Diagnosis not present

## 2022-10-17 DIAGNOSIS — N186 End stage renal disease: Secondary | ICD-10-CM | POA: Diagnosis not present

## 2022-10-17 DIAGNOSIS — D509 Iron deficiency anemia, unspecified: Secondary | ICD-10-CM | POA: Diagnosis not present

## 2022-10-20 ENCOUNTER — Other Ambulatory Visit (HOSPITAL_COMMUNITY): Payer: Self-pay

## 2022-10-21 DIAGNOSIS — Z992 Dependence on renal dialysis: Secondary | ICD-10-CM | POA: Diagnosis not present

## 2022-10-21 DIAGNOSIS — R7881 Bacteremia: Secondary | ICD-10-CM | POA: Diagnosis not present

## 2022-10-21 DIAGNOSIS — N186 End stage renal disease: Secondary | ICD-10-CM | POA: Diagnosis not present

## 2022-10-21 DIAGNOSIS — D689 Coagulation defect, unspecified: Secondary | ICD-10-CM | POA: Diagnosis not present

## 2022-10-21 DIAGNOSIS — N2581 Secondary hyperparathyroidism of renal origin: Secondary | ICD-10-CM | POA: Diagnosis not present

## 2022-10-21 DIAGNOSIS — D509 Iron deficiency anemia, unspecified: Secondary | ICD-10-CM | POA: Diagnosis not present

## 2022-10-22 ENCOUNTER — Telehealth: Payer: Self-pay

## 2022-10-22 ENCOUNTER — Ambulatory Visit: Payer: Medicaid Other | Admitting: Internal Medicine

## 2022-10-22 NOTE — Telephone Encounter (Signed)
Attempted to call patient regarding missed appointment. Per Dr. Gale Journey patient can follow up as needed. Will need to follow up with Cardiology. Left voicemail stating this. Leatrice Jewels, RMA

## 2022-10-23 DIAGNOSIS — D689 Coagulation defect, unspecified: Secondary | ICD-10-CM | POA: Diagnosis not present

## 2022-10-23 DIAGNOSIS — D509 Iron deficiency anemia, unspecified: Secondary | ICD-10-CM | POA: Diagnosis not present

## 2022-10-23 DIAGNOSIS — Z992 Dependence on renal dialysis: Secondary | ICD-10-CM | POA: Diagnosis not present

## 2022-10-23 DIAGNOSIS — N2581 Secondary hyperparathyroidism of renal origin: Secondary | ICD-10-CM | POA: Diagnosis not present

## 2022-10-23 DIAGNOSIS — N186 End stage renal disease: Secondary | ICD-10-CM | POA: Diagnosis not present

## 2022-10-23 DIAGNOSIS — R7881 Bacteremia: Secondary | ICD-10-CM | POA: Diagnosis not present

## 2022-10-24 ENCOUNTER — Other Ambulatory Visit (HOSPITAL_COMMUNITY): Payer: Self-pay

## 2022-10-24 ENCOUNTER — Other Ambulatory Visit (HOSPITAL_COMMUNITY): Payer: Self-pay | Admitting: Internal Medicine

## 2022-10-24 DIAGNOSIS — E7849 Other hyperlipidemia: Secondary | ICD-10-CM

## 2022-10-24 MED ORDER — ATORVASTATIN CALCIUM 80 MG PO TABS
80.0000 mg | ORAL_TABLET | Freq: Every day | ORAL | 0 refills | Status: DC
  Filled 2022-10-24: qty 30, 30d supply, fill #0

## 2022-10-27 ENCOUNTER — Other Ambulatory Visit (HOSPITAL_COMMUNITY): Payer: Self-pay

## 2022-10-27 MED ORDER — ONDANSETRON HCL 4 MG PO TABS
4.0000 mg | ORAL_TABLET | Freq: Every day | ORAL | 11 refills | Status: DC | PRN
  Filled 2022-10-27: qty 30, 30d supply, fill #0
  Filled 2023-02-23 (×2): qty 30, 30d supply, fill #1
  Filled 2023-04-15: qty 30, 30d supply, fill #2

## 2022-10-28 ENCOUNTER — Telehealth (HOSPITAL_COMMUNITY): Payer: Self-pay | Admitting: Surgery

## 2022-10-28 DIAGNOSIS — Z992 Dependence on renal dialysis: Secondary | ICD-10-CM | POA: Diagnosis not present

## 2022-10-28 DIAGNOSIS — D689 Coagulation defect, unspecified: Secondary | ICD-10-CM | POA: Diagnosis not present

## 2022-10-28 DIAGNOSIS — R7881 Bacteremia: Secondary | ICD-10-CM | POA: Diagnosis not present

## 2022-10-28 DIAGNOSIS — N2581 Secondary hyperparathyroidism of renal origin: Secondary | ICD-10-CM | POA: Diagnosis not present

## 2022-10-28 DIAGNOSIS — N186 End stage renal disease: Secondary | ICD-10-CM | POA: Diagnosis not present

## 2022-10-28 NOTE — Telephone Encounter (Signed)
I called patient regarding Zio monitor return after receiving an email from company indicating that it was delayed.  He tells me that he mailed the device back a "while ago".  I will let Tama know that device has been returned.

## 2022-10-30 DIAGNOSIS — D689 Coagulation defect, unspecified: Secondary | ICD-10-CM | POA: Diagnosis not present

## 2022-10-30 DIAGNOSIS — R7881 Bacteremia: Secondary | ICD-10-CM | POA: Diagnosis not present

## 2022-10-30 DIAGNOSIS — N186 End stage renal disease: Secondary | ICD-10-CM | POA: Diagnosis not present

## 2022-10-30 DIAGNOSIS — N2581 Secondary hyperparathyroidism of renal origin: Secondary | ICD-10-CM | POA: Diagnosis not present

## 2022-10-30 DIAGNOSIS — Z992 Dependence on renal dialysis: Secondary | ICD-10-CM | POA: Diagnosis not present

## 2022-11-04 DIAGNOSIS — N186 End stage renal disease: Secondary | ICD-10-CM | POA: Diagnosis not present

## 2022-11-04 DIAGNOSIS — N2581 Secondary hyperparathyroidism of renal origin: Secondary | ICD-10-CM | POA: Diagnosis not present

## 2022-11-04 DIAGNOSIS — R7881 Bacteremia: Secondary | ICD-10-CM | POA: Diagnosis not present

## 2022-11-04 DIAGNOSIS — D689 Coagulation defect, unspecified: Secondary | ICD-10-CM | POA: Diagnosis not present

## 2022-11-04 DIAGNOSIS — Z992 Dependence on renal dialysis: Secondary | ICD-10-CM | POA: Diagnosis not present

## 2022-11-04 NOTE — Addendum Note (Signed)
Encounter addended by: Micki Riley, RN on: 11/04/2022 4:18 PM  Actions taken: Imaging Exam ended

## 2022-11-06 DIAGNOSIS — E1129 Type 2 diabetes mellitus with other diabetic kidney complication: Secondary | ICD-10-CM | POA: Diagnosis not present

## 2022-11-06 DIAGNOSIS — N186 End stage renal disease: Secondary | ICD-10-CM | POA: Diagnosis not present

## 2022-11-06 DIAGNOSIS — Z992 Dependence on renal dialysis: Secondary | ICD-10-CM | POA: Diagnosis not present

## 2022-11-07 ENCOUNTER — Other Ambulatory Visit (HOSPITAL_COMMUNITY): Payer: Self-pay

## 2022-11-08 DIAGNOSIS — D689 Coagulation defect, unspecified: Secondary | ICD-10-CM | POA: Diagnosis not present

## 2022-11-08 DIAGNOSIS — D509 Iron deficiency anemia, unspecified: Secondary | ICD-10-CM | POA: Diagnosis not present

## 2022-11-08 DIAGNOSIS — N186 End stage renal disease: Secondary | ICD-10-CM | POA: Diagnosis not present

## 2022-11-08 DIAGNOSIS — Z992 Dependence on renal dialysis: Secondary | ICD-10-CM | POA: Diagnosis not present

## 2022-11-08 DIAGNOSIS — N2581 Secondary hyperparathyroidism of renal origin: Secondary | ICD-10-CM | POA: Diagnosis not present

## 2022-11-08 DIAGNOSIS — R7881 Bacteremia: Secondary | ICD-10-CM | POA: Diagnosis not present

## 2022-11-10 ENCOUNTER — Telehealth (HOSPITAL_COMMUNITY): Payer: Self-pay

## 2022-11-10 ENCOUNTER — Other Ambulatory Visit (HOSPITAL_COMMUNITY): Payer: Self-pay

## 2022-11-10 MED ORDER — CARVEDILOL 25 MG PO TABS
50.0000 mg | ORAL_TABLET | Freq: Two times a day (BID) | ORAL | 11 refills | Status: DC
  Filled 2022-11-10: qty 120, fill #0
  Filled 2023-01-16: qty 120, 30d supply, fill #0
  Filled 2023-02-16: qty 120, 30d supply, fill #1
  Filled 2023-03-19: qty 120, 30d supply, fill #2
  Filled 2023-04-17: qty 120, 30d supply, fill #3
  Filled 2023-07-03: qty 120, 30d supply, fill #4
  Filled 2023-09-17 (×2): qty 120, 30d supply, fill #5

## 2022-11-10 NOTE — Telephone Encounter (Signed)
Patient's med has been changed and updated in his chart. Pt has been made aware, agreeable, and verbalized understanding of his results.

## 2022-11-11 DIAGNOSIS — R7881 Bacteremia: Secondary | ICD-10-CM | POA: Diagnosis not present

## 2022-11-11 DIAGNOSIS — D509 Iron deficiency anemia, unspecified: Secondary | ICD-10-CM | POA: Diagnosis not present

## 2022-11-11 DIAGNOSIS — N2581 Secondary hyperparathyroidism of renal origin: Secondary | ICD-10-CM | POA: Diagnosis not present

## 2022-11-11 DIAGNOSIS — N186 End stage renal disease: Secondary | ICD-10-CM | POA: Diagnosis not present

## 2022-11-11 DIAGNOSIS — D689 Coagulation defect, unspecified: Secondary | ICD-10-CM | POA: Diagnosis not present

## 2022-11-11 DIAGNOSIS — Z992 Dependence on renal dialysis: Secondary | ICD-10-CM | POA: Diagnosis not present

## 2022-11-13 DIAGNOSIS — D509 Iron deficiency anemia, unspecified: Secondary | ICD-10-CM | POA: Diagnosis not present

## 2022-11-13 DIAGNOSIS — Z992 Dependence on renal dialysis: Secondary | ICD-10-CM | POA: Diagnosis not present

## 2022-11-13 DIAGNOSIS — R7881 Bacteremia: Secondary | ICD-10-CM | POA: Diagnosis not present

## 2022-11-13 DIAGNOSIS — N2581 Secondary hyperparathyroidism of renal origin: Secondary | ICD-10-CM | POA: Diagnosis not present

## 2022-11-13 DIAGNOSIS — D689 Coagulation defect, unspecified: Secondary | ICD-10-CM | POA: Diagnosis not present

## 2022-11-13 DIAGNOSIS — N186 End stage renal disease: Secondary | ICD-10-CM | POA: Diagnosis not present

## 2022-11-15 DIAGNOSIS — N2581 Secondary hyperparathyroidism of renal origin: Secondary | ICD-10-CM | POA: Diagnosis not present

## 2022-11-15 DIAGNOSIS — Z992 Dependence on renal dialysis: Secondary | ICD-10-CM | POA: Diagnosis not present

## 2022-11-15 DIAGNOSIS — R7881 Bacteremia: Secondary | ICD-10-CM | POA: Diagnosis not present

## 2022-11-15 DIAGNOSIS — N186 End stage renal disease: Secondary | ICD-10-CM | POA: Diagnosis not present

## 2022-11-15 DIAGNOSIS — D509 Iron deficiency anemia, unspecified: Secondary | ICD-10-CM | POA: Diagnosis not present

## 2022-11-15 DIAGNOSIS — D689 Coagulation defect, unspecified: Secondary | ICD-10-CM | POA: Diagnosis not present

## 2022-11-18 DIAGNOSIS — R7881 Bacteremia: Secondary | ICD-10-CM | POA: Diagnosis not present

## 2022-11-18 DIAGNOSIS — D689 Coagulation defect, unspecified: Secondary | ICD-10-CM | POA: Diagnosis not present

## 2022-11-18 DIAGNOSIS — N2581 Secondary hyperparathyroidism of renal origin: Secondary | ICD-10-CM | POA: Diagnosis not present

## 2022-11-18 DIAGNOSIS — Z992 Dependence on renal dialysis: Secondary | ICD-10-CM | POA: Diagnosis not present

## 2022-11-18 DIAGNOSIS — N186 End stage renal disease: Secondary | ICD-10-CM | POA: Diagnosis not present

## 2022-11-22 DIAGNOSIS — N2581 Secondary hyperparathyroidism of renal origin: Secondary | ICD-10-CM | POA: Diagnosis not present

## 2022-11-22 DIAGNOSIS — D689 Coagulation defect, unspecified: Secondary | ICD-10-CM | POA: Diagnosis not present

## 2022-11-22 DIAGNOSIS — N186 End stage renal disease: Secondary | ICD-10-CM | POA: Diagnosis not present

## 2022-11-22 DIAGNOSIS — Z992 Dependence on renal dialysis: Secondary | ICD-10-CM | POA: Diagnosis not present

## 2022-11-22 DIAGNOSIS — R7881 Bacteremia: Secondary | ICD-10-CM | POA: Diagnosis not present

## 2022-11-25 ENCOUNTER — Other Ambulatory Visit (HOSPITAL_COMMUNITY): Payer: Self-pay | Admitting: Internal Medicine

## 2022-11-25 ENCOUNTER — Other Ambulatory Visit (HOSPITAL_COMMUNITY): Payer: Self-pay

## 2022-11-25 DIAGNOSIS — R7881 Bacteremia: Secondary | ICD-10-CM | POA: Diagnosis not present

## 2022-11-25 DIAGNOSIS — N2581 Secondary hyperparathyroidism of renal origin: Secondary | ICD-10-CM | POA: Diagnosis not present

## 2022-11-25 DIAGNOSIS — D689 Coagulation defect, unspecified: Secondary | ICD-10-CM | POA: Diagnosis not present

## 2022-11-25 DIAGNOSIS — D509 Iron deficiency anemia, unspecified: Secondary | ICD-10-CM | POA: Diagnosis not present

## 2022-11-25 DIAGNOSIS — E7849 Other hyperlipidemia: Secondary | ICD-10-CM

## 2022-11-25 DIAGNOSIS — Z992 Dependence on renal dialysis: Secondary | ICD-10-CM | POA: Diagnosis not present

## 2022-11-25 DIAGNOSIS — N186 End stage renal disease: Secondary | ICD-10-CM | POA: Diagnosis not present

## 2022-11-26 ENCOUNTER — Other Ambulatory Visit (HOSPITAL_COMMUNITY): Payer: Self-pay

## 2022-11-26 MED ORDER — ATORVASTATIN CALCIUM 80 MG PO TABS
80.0000 mg | ORAL_TABLET | Freq: Every day | ORAL | 11 refills | Status: DC
  Filled 2022-11-26: qty 30, 30d supply, fill #0
  Filled 2022-12-26: qty 30, 30d supply, fill #1

## 2022-11-27 ENCOUNTER — Other Ambulatory Visit (HOSPITAL_COMMUNITY): Payer: Self-pay

## 2022-11-29 DIAGNOSIS — R7881 Bacteremia: Secondary | ICD-10-CM | POA: Diagnosis not present

## 2022-11-29 DIAGNOSIS — D509 Iron deficiency anemia, unspecified: Secondary | ICD-10-CM | POA: Diagnosis not present

## 2022-11-29 DIAGNOSIS — N186 End stage renal disease: Secondary | ICD-10-CM | POA: Diagnosis not present

## 2022-11-29 DIAGNOSIS — N2581 Secondary hyperparathyroidism of renal origin: Secondary | ICD-10-CM | POA: Diagnosis not present

## 2022-11-29 DIAGNOSIS — D689 Coagulation defect, unspecified: Secondary | ICD-10-CM | POA: Diagnosis not present

## 2022-11-29 DIAGNOSIS — Z992 Dependence on renal dialysis: Secondary | ICD-10-CM | POA: Diagnosis not present

## 2022-12-07 DIAGNOSIS — E1129 Type 2 diabetes mellitus with other diabetic kidney complication: Secondary | ICD-10-CM | POA: Diagnosis not present

## 2022-12-07 DIAGNOSIS — N186 End stage renal disease: Secondary | ICD-10-CM | POA: Diagnosis not present

## 2022-12-07 DIAGNOSIS — Z992 Dependence on renal dialysis: Secondary | ICD-10-CM | POA: Diagnosis not present

## 2022-12-09 DIAGNOSIS — N2581 Secondary hyperparathyroidism of renal origin: Secondary | ICD-10-CM | POA: Diagnosis not present

## 2022-12-09 DIAGNOSIS — Z992 Dependence on renal dialysis: Secondary | ICD-10-CM | POA: Diagnosis not present

## 2022-12-09 DIAGNOSIS — D689 Coagulation defect, unspecified: Secondary | ICD-10-CM | POA: Diagnosis not present

## 2022-12-09 DIAGNOSIS — R7881 Bacteremia: Secondary | ICD-10-CM | POA: Diagnosis not present

## 2022-12-09 DIAGNOSIS — N186 End stage renal disease: Secondary | ICD-10-CM | POA: Diagnosis not present

## 2022-12-12 DIAGNOSIS — R7881 Bacteremia: Secondary | ICD-10-CM | POA: Diagnosis not present

## 2022-12-12 DIAGNOSIS — N186 End stage renal disease: Secondary | ICD-10-CM | POA: Diagnosis not present

## 2022-12-12 DIAGNOSIS — N2581 Secondary hyperparathyroidism of renal origin: Secondary | ICD-10-CM | POA: Diagnosis not present

## 2022-12-12 DIAGNOSIS — Z992 Dependence on renal dialysis: Secondary | ICD-10-CM | POA: Diagnosis not present

## 2022-12-12 DIAGNOSIS — D689 Coagulation defect, unspecified: Secondary | ICD-10-CM | POA: Diagnosis not present

## 2022-12-16 DIAGNOSIS — N186 End stage renal disease: Secondary | ICD-10-CM | POA: Diagnosis not present

## 2022-12-16 DIAGNOSIS — D509 Iron deficiency anemia, unspecified: Secondary | ICD-10-CM | POA: Diagnosis not present

## 2022-12-16 DIAGNOSIS — Z23 Encounter for immunization: Secondary | ICD-10-CM | POA: Diagnosis not present

## 2022-12-16 DIAGNOSIS — D689 Coagulation defect, unspecified: Secondary | ICD-10-CM | POA: Diagnosis not present

## 2022-12-16 DIAGNOSIS — N2581 Secondary hyperparathyroidism of renal origin: Secondary | ICD-10-CM | POA: Diagnosis not present

## 2022-12-16 DIAGNOSIS — R7881 Bacteremia: Secondary | ICD-10-CM | POA: Diagnosis not present

## 2022-12-16 DIAGNOSIS — Z992 Dependence on renal dialysis: Secondary | ICD-10-CM | POA: Diagnosis not present

## 2022-12-18 DIAGNOSIS — D689 Coagulation defect, unspecified: Secondary | ICD-10-CM | POA: Diagnosis not present

## 2022-12-18 DIAGNOSIS — N2581 Secondary hyperparathyroidism of renal origin: Secondary | ICD-10-CM | POA: Diagnosis not present

## 2022-12-18 DIAGNOSIS — D509 Iron deficiency anemia, unspecified: Secondary | ICD-10-CM | POA: Diagnosis not present

## 2022-12-18 DIAGNOSIS — N186 End stage renal disease: Secondary | ICD-10-CM | POA: Diagnosis not present

## 2022-12-18 DIAGNOSIS — Z992 Dependence on renal dialysis: Secondary | ICD-10-CM | POA: Diagnosis not present

## 2022-12-18 DIAGNOSIS — R7881 Bacteremia: Secondary | ICD-10-CM | POA: Diagnosis not present

## 2022-12-18 DIAGNOSIS — Z23 Encounter for immunization: Secondary | ICD-10-CM | POA: Diagnosis not present

## 2022-12-26 ENCOUNTER — Other Ambulatory Visit (HOSPITAL_COMMUNITY): Payer: Self-pay

## 2022-12-30 DIAGNOSIS — N2581 Secondary hyperparathyroidism of renal origin: Secondary | ICD-10-CM | POA: Diagnosis not present

## 2022-12-30 DIAGNOSIS — N186 End stage renal disease: Secondary | ICD-10-CM | POA: Diagnosis not present

## 2022-12-30 DIAGNOSIS — Z992 Dependence on renal dialysis: Secondary | ICD-10-CM | POA: Diagnosis not present

## 2022-12-30 DIAGNOSIS — R7881 Bacteremia: Secondary | ICD-10-CM | POA: Diagnosis not present

## 2022-12-30 DIAGNOSIS — D689 Coagulation defect, unspecified: Secondary | ICD-10-CM | POA: Diagnosis not present

## 2023-01-02 ENCOUNTER — Other Ambulatory Visit: Payer: Self-pay

## 2023-01-02 ENCOUNTER — Other Ambulatory Visit (HOSPITAL_COMMUNITY): Payer: Self-pay

## 2023-01-02 DIAGNOSIS — D689 Coagulation defect, unspecified: Secondary | ICD-10-CM | POA: Diagnosis not present

## 2023-01-02 DIAGNOSIS — R7881 Bacteremia: Secondary | ICD-10-CM | POA: Diagnosis not present

## 2023-01-02 DIAGNOSIS — N186 End stage renal disease: Secondary | ICD-10-CM | POA: Diagnosis not present

## 2023-01-02 DIAGNOSIS — N2581 Secondary hyperparathyroidism of renal origin: Secondary | ICD-10-CM | POA: Diagnosis not present

## 2023-01-02 DIAGNOSIS — Z992 Dependence on renal dialysis: Secondary | ICD-10-CM | POA: Diagnosis not present

## 2023-01-06 ENCOUNTER — Other Ambulatory Visit (HOSPITAL_COMMUNITY): Payer: Self-pay

## 2023-01-06 ENCOUNTER — Other Ambulatory Visit (HOSPITAL_COMMUNITY): Payer: Self-pay | Admitting: Cardiology

## 2023-01-06 DIAGNOSIS — R7881 Bacteremia: Secondary | ICD-10-CM | POA: Diagnosis not present

## 2023-01-06 DIAGNOSIS — N2581 Secondary hyperparathyroidism of renal origin: Secondary | ICD-10-CM | POA: Diagnosis not present

## 2023-01-06 DIAGNOSIS — N186 End stage renal disease: Secondary | ICD-10-CM | POA: Diagnosis not present

## 2023-01-06 DIAGNOSIS — D509 Iron deficiency anemia, unspecified: Secondary | ICD-10-CM | POA: Diagnosis not present

## 2023-01-06 DIAGNOSIS — D689 Coagulation defect, unspecified: Secondary | ICD-10-CM | POA: Diagnosis not present

## 2023-01-06 DIAGNOSIS — Z992 Dependence on renal dialysis: Secondary | ICD-10-CM | POA: Diagnosis not present

## 2023-01-06 DIAGNOSIS — E7849 Other hyperlipidemia: Secondary | ICD-10-CM

## 2023-01-06 MED ORDER — ATORVASTATIN CALCIUM 80 MG PO TABS
80.0000 mg | ORAL_TABLET | Freq: Every day | ORAL | 3 refills | Status: DC
  Filled 2023-01-06 – 2023-01-23 (×2): qty 90, 90d supply, fill #0
  Filled 2023-04-17: qty 90, 90d supply, fill #1
  Filled 2023-07-13 – 2023-07-15 (×2): qty 90, 90d supply, fill #2
  Filled 2023-10-31: qty 90, 90d supply, fill #3

## 2023-01-10 DIAGNOSIS — N2581 Secondary hyperparathyroidism of renal origin: Secondary | ICD-10-CM | POA: Diagnosis not present

## 2023-01-10 DIAGNOSIS — Z992 Dependence on renal dialysis: Secondary | ICD-10-CM | POA: Diagnosis not present

## 2023-01-10 DIAGNOSIS — R7881 Bacteremia: Secondary | ICD-10-CM | POA: Diagnosis not present

## 2023-01-10 DIAGNOSIS — D509 Iron deficiency anemia, unspecified: Secondary | ICD-10-CM | POA: Diagnosis not present

## 2023-01-10 DIAGNOSIS — N186 End stage renal disease: Secondary | ICD-10-CM | POA: Diagnosis not present

## 2023-01-12 ENCOUNTER — Other Ambulatory Visit (HOSPITAL_COMMUNITY): Payer: Self-pay

## 2023-01-13 DIAGNOSIS — N2581 Secondary hyperparathyroidism of renal origin: Secondary | ICD-10-CM | POA: Diagnosis not present

## 2023-01-13 DIAGNOSIS — D689 Coagulation defect, unspecified: Secondary | ICD-10-CM | POA: Diagnosis not present

## 2023-01-13 DIAGNOSIS — N186 End stage renal disease: Secondary | ICD-10-CM | POA: Diagnosis not present

## 2023-01-13 DIAGNOSIS — R7881 Bacteremia: Secondary | ICD-10-CM | POA: Diagnosis not present

## 2023-01-13 DIAGNOSIS — Z992 Dependence on renal dialysis: Secondary | ICD-10-CM | POA: Diagnosis not present

## 2023-01-13 DIAGNOSIS — N185 Chronic kidney disease, stage 5: Secondary | ICD-10-CM | POA: Diagnosis not present

## 2023-01-16 ENCOUNTER — Other Ambulatory Visit (HOSPITAL_COMMUNITY): Payer: Self-pay

## 2023-01-16 DIAGNOSIS — Z992 Dependence on renal dialysis: Secondary | ICD-10-CM | POA: Diagnosis not present

## 2023-01-16 DIAGNOSIS — D689 Coagulation defect, unspecified: Secondary | ICD-10-CM | POA: Diagnosis not present

## 2023-01-16 DIAGNOSIS — N186 End stage renal disease: Secondary | ICD-10-CM | POA: Diagnosis not present

## 2023-01-16 DIAGNOSIS — N185 Chronic kidney disease, stage 5: Secondary | ICD-10-CM | POA: Diagnosis not present

## 2023-01-16 DIAGNOSIS — N2581 Secondary hyperparathyroidism of renal origin: Secondary | ICD-10-CM | POA: Diagnosis not present

## 2023-01-16 DIAGNOSIS — R7881 Bacteremia: Secondary | ICD-10-CM | POA: Diagnosis not present

## 2023-01-20 DIAGNOSIS — D689 Coagulation defect, unspecified: Secondary | ICD-10-CM | POA: Diagnosis not present

## 2023-01-20 DIAGNOSIS — N2581 Secondary hyperparathyroidism of renal origin: Secondary | ICD-10-CM | POA: Diagnosis not present

## 2023-01-20 DIAGNOSIS — R7881 Bacteremia: Secondary | ICD-10-CM | POA: Diagnosis not present

## 2023-01-20 DIAGNOSIS — N186 End stage renal disease: Secondary | ICD-10-CM | POA: Diagnosis not present

## 2023-01-20 DIAGNOSIS — Z992 Dependence on renal dialysis: Secondary | ICD-10-CM | POA: Diagnosis not present

## 2023-01-23 ENCOUNTER — Other Ambulatory Visit (HOSPITAL_COMMUNITY): Payer: Self-pay

## 2023-01-23 DIAGNOSIS — R7881 Bacteremia: Secondary | ICD-10-CM | POA: Diagnosis not present

## 2023-01-23 DIAGNOSIS — D689 Coagulation defect, unspecified: Secondary | ICD-10-CM | POA: Diagnosis not present

## 2023-01-23 DIAGNOSIS — N186 End stage renal disease: Secondary | ICD-10-CM | POA: Diagnosis not present

## 2023-01-23 DIAGNOSIS — N2581 Secondary hyperparathyroidism of renal origin: Secondary | ICD-10-CM | POA: Diagnosis not present

## 2023-01-23 DIAGNOSIS — Z992 Dependence on renal dialysis: Secondary | ICD-10-CM | POA: Diagnosis not present

## 2023-01-27 DIAGNOSIS — R519 Headache, unspecified: Secondary | ICD-10-CM | POA: Diagnosis not present

## 2023-01-27 DIAGNOSIS — Z992 Dependence on renal dialysis: Secondary | ICD-10-CM | POA: Diagnosis not present

## 2023-01-27 DIAGNOSIS — N2581 Secondary hyperparathyroidism of renal origin: Secondary | ICD-10-CM | POA: Diagnosis not present

## 2023-01-27 DIAGNOSIS — R7881 Bacteremia: Secondary | ICD-10-CM | POA: Diagnosis not present

## 2023-01-27 DIAGNOSIS — N186 End stage renal disease: Secondary | ICD-10-CM | POA: Diagnosis not present

## 2023-01-27 DIAGNOSIS — D689 Coagulation defect, unspecified: Secondary | ICD-10-CM | POA: Diagnosis not present

## 2023-01-29 LAB — HM DIABETES EYE EXAM

## 2023-01-30 DIAGNOSIS — N2581 Secondary hyperparathyroidism of renal origin: Secondary | ICD-10-CM | POA: Diagnosis not present

## 2023-01-30 DIAGNOSIS — D689 Coagulation defect, unspecified: Secondary | ICD-10-CM | POA: Diagnosis not present

## 2023-01-30 DIAGNOSIS — Z992 Dependence on renal dialysis: Secondary | ICD-10-CM | POA: Diagnosis not present

## 2023-01-30 DIAGNOSIS — N186 End stage renal disease: Secondary | ICD-10-CM | POA: Diagnosis not present

## 2023-01-30 DIAGNOSIS — R7881 Bacteremia: Secondary | ICD-10-CM | POA: Diagnosis not present

## 2023-01-30 DIAGNOSIS — R519 Headache, unspecified: Secondary | ICD-10-CM | POA: Diagnosis not present

## 2023-02-03 DIAGNOSIS — Z992 Dependence on renal dialysis: Secondary | ICD-10-CM | POA: Diagnosis not present

## 2023-02-03 DIAGNOSIS — D509 Iron deficiency anemia, unspecified: Secondary | ICD-10-CM | POA: Diagnosis not present

## 2023-02-03 DIAGNOSIS — N186 End stage renal disease: Secondary | ICD-10-CM | POA: Diagnosis not present

## 2023-02-03 DIAGNOSIS — N2581 Secondary hyperparathyroidism of renal origin: Secondary | ICD-10-CM | POA: Diagnosis not present

## 2023-02-03 DIAGNOSIS — R7881 Bacteremia: Secondary | ICD-10-CM | POA: Diagnosis not present

## 2023-02-03 DIAGNOSIS — D689 Coagulation defect, unspecified: Secondary | ICD-10-CM | POA: Diagnosis not present

## 2023-02-05 DIAGNOSIS — N186 End stage renal disease: Secondary | ICD-10-CM | POA: Diagnosis not present

## 2023-02-05 DIAGNOSIS — D509 Iron deficiency anemia, unspecified: Secondary | ICD-10-CM | POA: Diagnosis not present

## 2023-02-05 DIAGNOSIS — Z992 Dependence on renal dialysis: Secondary | ICD-10-CM | POA: Diagnosis not present

## 2023-02-05 DIAGNOSIS — N2581 Secondary hyperparathyroidism of renal origin: Secondary | ICD-10-CM | POA: Diagnosis not present

## 2023-02-05 DIAGNOSIS — R7881 Bacteremia: Secondary | ICD-10-CM | POA: Diagnosis not present

## 2023-02-05 DIAGNOSIS — D689 Coagulation defect, unspecified: Secondary | ICD-10-CM | POA: Diagnosis not present

## 2023-02-06 DIAGNOSIS — E1129 Type 2 diabetes mellitus with other diabetic kidney complication: Secondary | ICD-10-CM | POA: Diagnosis not present

## 2023-02-06 DIAGNOSIS — N186 End stage renal disease: Secondary | ICD-10-CM | POA: Diagnosis not present

## 2023-02-06 DIAGNOSIS — Z992 Dependence on renal dialysis: Secondary | ICD-10-CM | POA: Diagnosis not present

## 2023-02-09 ENCOUNTER — Encounter: Payer: Self-pay | Admitting: *Deleted

## 2023-02-10 DIAGNOSIS — N186 End stage renal disease: Secondary | ICD-10-CM | POA: Diagnosis not present

## 2023-02-10 DIAGNOSIS — Z992 Dependence on renal dialysis: Secondary | ICD-10-CM | POA: Diagnosis not present

## 2023-02-10 DIAGNOSIS — R7881 Bacteremia: Secondary | ICD-10-CM | POA: Diagnosis not present

## 2023-02-10 DIAGNOSIS — D689 Coagulation defect, unspecified: Secondary | ICD-10-CM | POA: Diagnosis not present

## 2023-02-10 DIAGNOSIS — N2581 Secondary hyperparathyroidism of renal origin: Secondary | ICD-10-CM | POA: Diagnosis not present

## 2023-02-14 DIAGNOSIS — N186 End stage renal disease: Secondary | ICD-10-CM | POA: Diagnosis not present

## 2023-02-14 DIAGNOSIS — N2581 Secondary hyperparathyroidism of renal origin: Secondary | ICD-10-CM | POA: Diagnosis not present

## 2023-02-14 DIAGNOSIS — D689 Coagulation defect, unspecified: Secondary | ICD-10-CM | POA: Diagnosis not present

## 2023-02-14 DIAGNOSIS — Z992 Dependence on renal dialysis: Secondary | ICD-10-CM | POA: Diagnosis not present

## 2023-02-14 DIAGNOSIS — R7881 Bacteremia: Secondary | ICD-10-CM | POA: Diagnosis not present

## 2023-02-16 ENCOUNTER — Other Ambulatory Visit (HOSPITAL_COMMUNITY): Payer: Self-pay

## 2023-02-16 ENCOUNTER — Encounter (HOSPITAL_COMMUNITY): Payer: Self-pay | Admitting: *Deleted

## 2023-02-17 ENCOUNTER — Other Ambulatory Visit (HOSPITAL_COMMUNITY): Payer: Self-pay

## 2023-02-17 DIAGNOSIS — E1121 Type 2 diabetes mellitus with diabetic nephropathy: Secondary | ICD-10-CM | POA: Diagnosis not present

## 2023-02-17 DIAGNOSIS — L02411 Cutaneous abscess of right axilla: Secondary | ICD-10-CM | POA: Diagnosis not present

## 2023-02-17 DIAGNOSIS — K611 Rectal abscess: Secondary | ICD-10-CM | POA: Diagnosis not present

## 2023-02-17 DIAGNOSIS — L089 Local infection of the skin and subcutaneous tissue, unspecified: Secondary | ICD-10-CM | POA: Diagnosis not present

## 2023-02-17 MED ORDER — SULFAMETHOXAZOLE-TRIMETHOPRIM 800-160 MG PO TABS
1.0000 | ORAL_TABLET | Freq: Two times a day (BID) | ORAL | 0 refills | Status: DC
  Filled 2023-02-17: qty 10, 5d supply, fill #0

## 2023-02-18 ENCOUNTER — Emergency Department (HOSPITAL_COMMUNITY): Payer: Medicaid Other

## 2023-02-18 ENCOUNTER — Other Ambulatory Visit: Payer: Self-pay

## 2023-02-18 ENCOUNTER — Encounter (HOSPITAL_COMMUNITY): Payer: Self-pay

## 2023-02-18 ENCOUNTER — Emergency Department (HOSPITAL_COMMUNITY)
Admission: EM | Admit: 2023-02-18 | Discharge: 2023-02-18 | Disposition: A | Discharge status: Home / Self Care | Payer: Medicaid Other | Attending: Emergency Medicine | Admitting: Emergency Medicine

## 2023-02-18 DIAGNOSIS — E1121 Type 2 diabetes mellitus with diabetic nephropathy: Secondary | ICD-10-CM | POA: Diagnosis not present

## 2023-02-18 DIAGNOSIS — D72829 Elevated white blood cell count, unspecified: Secondary | ICD-10-CM | POA: Insufficient documentation

## 2023-02-18 DIAGNOSIS — L02215 Cutaneous abscess of perineum: Secondary | ICD-10-CM | POA: Diagnosis not present

## 2023-02-18 DIAGNOSIS — Z992 Dependence on renal dialysis: Secondary | ICD-10-CM | POA: Diagnosis not present

## 2023-02-18 DIAGNOSIS — L0231 Cutaneous abscess of buttock: Secondary | ICD-10-CM | POA: Insufficient documentation

## 2023-02-18 DIAGNOSIS — N186 End stage renal disease: Secondary | ICD-10-CM | POA: Insufficient documentation

## 2023-02-18 DIAGNOSIS — I509 Heart failure, unspecified: Secondary | ICD-10-CM | POA: Diagnosis not present

## 2023-02-18 DIAGNOSIS — K5730 Diverticulosis of large intestine without perforation or abscess without bleeding: Secondary | ICD-10-CM | POA: Diagnosis not present

## 2023-02-18 DIAGNOSIS — J45909 Unspecified asthma, uncomplicated: Secondary | ICD-10-CM | POA: Diagnosis not present

## 2023-02-18 DIAGNOSIS — Z7984 Long term (current) use of oral hypoglycemic drugs: Secondary | ICD-10-CM | POA: Diagnosis not present

## 2023-02-18 DIAGNOSIS — L0291 Cutaneous abscess, unspecified: Secondary | ICD-10-CM

## 2023-02-18 DIAGNOSIS — I5042 Chronic combined systolic (congestive) and diastolic (congestive) heart failure: Secondary | ICD-10-CM | POA: Diagnosis not present

## 2023-02-18 DIAGNOSIS — E1122 Type 2 diabetes mellitus with diabetic chronic kidney disease: Secondary | ICD-10-CM | POA: Diagnosis not present

## 2023-02-18 DIAGNOSIS — Z794 Long term (current) use of insulin: Secondary | ICD-10-CM | POA: Diagnosis not present

## 2023-02-18 DIAGNOSIS — N185 Chronic kidney disease, stage 5: Secondary | ICD-10-CM | POA: Diagnosis not present

## 2023-02-18 DIAGNOSIS — L02411 Cutaneous abscess of right axilla: Secondary | ICD-10-CM | POA: Diagnosis not present

## 2023-02-18 LAB — CBC WITH DIFFERENTIAL/PLATELET
Abs Immature Granulocytes: 0.03 10*3/uL (ref 0.00–0.07)
Basophils Absolute: 0.1 10*3/uL (ref 0.0–0.1)
Basophils Relative: 1 %
Eosinophils Absolute: 0.2 10*3/uL (ref 0.0–0.5)
Eosinophils Relative: 2 %
HCT: 36.8 % — ABNORMAL LOW (ref 39.0–52.0)
Hemoglobin: 11.6 g/dL — ABNORMAL LOW (ref 13.0–17.0)
Immature Granulocytes: 0 %
Lymphocytes Relative: 8 %
Lymphs Abs: 0.9 10*3/uL (ref 0.7–4.0)
MCH: 27.4 pg (ref 26.0–34.0)
MCHC: 31.5 g/dL (ref 30.0–36.0)
MCV: 87 fL (ref 80.0–100.0)
Monocytes Absolute: 1.3 10*3/uL — ABNORMAL HIGH (ref 0.1–1.0)
Monocytes Relative: 11 %
Neutro Abs: 8.9 10*3/uL — ABNORMAL HIGH (ref 1.7–7.7)
Neutrophils Relative %: 78 %
Platelets: 218 10*3/uL (ref 150–400)
RBC: 4.23 MIL/uL (ref 4.22–5.81)
RDW: 16.3 % — ABNORMAL HIGH (ref 11.5–15.5)
WBC: 11.4 10*3/uL — ABNORMAL HIGH (ref 4.0–10.5)
nRBC: 0 % (ref 0.0–0.2)

## 2023-02-18 LAB — BASIC METABOLIC PANEL
Anion gap: 17 — ABNORMAL HIGH (ref 5–15)
BUN: 88 mg/dL — ABNORMAL HIGH (ref 6–20)
CO2: 18 mmol/L — ABNORMAL LOW (ref 22–32)
Calcium: 9.3 mg/dL (ref 8.9–10.3)
Chloride: 101 mmol/L (ref 98–111)
Creatinine, Ser: 15.96 mg/dL — ABNORMAL HIGH (ref 0.61–1.24)
GFR, Estimated: 3 mL/min — ABNORMAL LOW (ref >60)
Glucose, Bld: 169 mg/dL — ABNORMAL HIGH (ref 70–99)
Potassium: 5 mmol/L (ref 3.5–5.1)
Sodium: 136 mmol/L (ref 135–145)

## 2023-02-18 LAB — LACTIC ACID, PLASMA: Lactic Acid, Venous: 0.8 mmol/L (ref 0.5–1.9)

## 2023-02-18 MED ORDER — PIPERACILLIN-TAZOBACTAM 3.375 G IVPB 30 MIN
3.3750 g | Freq: Once | INTRAVENOUS | Status: AC
  Administered 2023-02-18: 3.375 g via INTRAVENOUS
  Filled 2023-02-18: qty 50

## 2023-02-18 MED ORDER — IOHEXOL 350 MG/ML SOLN
75.0000 mL | Freq: Once | INTRAVENOUS | Status: AC | PRN
  Administered 2023-02-18: 75 mL via INTRAVENOUS

## 2023-02-18 MED ORDER — LIDOCAINE HCL 2 % IJ SOLN
0.0000 mL | Freq: Once | INTRAMUSCULAR | Status: DC | PRN
  Filled 2023-02-18: qty 20

## 2023-02-18 MED ORDER — VANCOMYCIN HCL IN DEXTROSE 1-5 GM/200ML-% IV SOLN: 1000.0000 mg | INTRAVENOUS | Status: DC

## 2023-02-18 MED ORDER — LIDOCAINE HCL 1 % IJ SOLN: 40.0000 mL | Freq: Once | INTRAMUSCULAR | Status: DC

## 2023-02-18 MED ORDER — ONDANSETRON HCL 4 MG/2ML IJ SOLN
4.0000 mg | Freq: Four times a day (QID) | INTRAMUSCULAR | Status: DC | PRN
  Administered 2023-02-18: 4 mg via INTRAVENOUS

## 2023-02-18 MED ORDER — VANCOMYCIN VARIABLE DOSE PER UNSTABLE RENAL FUNCTION (PHARMACIST DOSING): Status: DC

## 2023-02-18 MED ORDER — VANCOMYCIN HCL 2000 MG/400ML IV SOLN
2000.0000 mg | Freq: Once | INTRAVENOUS | Status: AC
  Administered 2023-02-18: 2000 mg via INTRAVENOUS
  Filled 2023-02-18: qty 400

## 2023-02-18 MED ORDER — HYDROMORPHONE HCL 1 MG/ML IJ SOLN
0.5000 mg | Freq: Once | INTRAMUSCULAR | Status: AC
  Administered 2023-02-18: 0.5 mg via INTRAVENOUS
  Filled 2023-02-18: qty 1

## 2023-02-18 MED ORDER — ONDANSETRON HCL 4 MG/2ML IJ SOLN
INTRAMUSCULAR | Status: AC
  Filled 2023-02-18: qty 2

## 2023-02-18 MED ORDER — PIPERACILLIN-TAZOBACTAM IN DEX 2-0.25 GM/50ML IV SOLN
2.2500 g | Freq: Three times a day (TID) | INTRAVENOUS | Status: DC
  Filled 2023-02-18: qty 50

## 2023-02-18 MED ORDER — LIDOCAINE HCL (PF) 1 % IJ SOLN
40.0000 mL | Freq: Once | INTRAMUSCULAR | Status: DC
  Filled 2023-02-18: qty 60

## 2023-02-18 NOTE — Discharge Instructions (Addendum)
It was a pleasure taking care of you today. As discussed, continue taking your Bactrim as previously prescribed. I have included the number of general surgery. Call to schedule an appointment for follow-up. Return to the ER for new or worsening symptoms.   Call your dialysis center tomorrow to schedule a dialysis session

## 2023-02-18 NOTE — Procedures (Addendum)
Incision and Drainage Procedure Note  Pre-operative Diagnosis:  #1 buttock abscess, right #2 right axilla abscess  Post-operative Diagnosis: same  Procedures: #1 incision and drainage of Right buttock abscess. #2 incision and drainage of right axillary abscess  Indications: buttock abscess, not spontaneously draining   Anesthesia: 1% plain lidocaine  Procedure Details  The procedure, risks and complications have been discussed in detail (including, but not limited to infection, bleeding) with the patient, and the patient has signed consent to the procedure.  The skin around the right axillary was prepped. Lidocaine was infused into the area. A cruciate incision was made and large amount of purulent drainage was evacuated and a portion sent for culture. Bandage was placed in over the wound.  The skin at the buttock was sterilely prepped and draped over the affected area in the usual fashion. After adequate local anesthesia, I&D with a #11 blade was performed on the right buttock. Purulent drainage: present  The patient was observed until stable.  Findings: Purulent drainage   EBL: minimal  Drains: none  Condition: Tolerated procedure well   Complications: none.   Wound was packed with 1/4" iodoform gauze for hemostasis.  Follow up appointment made in our office for packing removal Friday 6/14.  Continue antibiotics as previously prescribed.    [facility] Please see [place] for pager number during day hours 7:00am-4:30pm

## 2023-02-18 NOTE — ED Notes (Signed)
Provided pt a urinal.

## 2023-02-18 NOTE — ED Triage Notes (Signed)
Patient reports non-draining abscess to R buttocks the size of a tennis ball x 3 days that is prohibiting him from sitting down and receiving his dialysis tx. Last tx on Saturday.

## 2023-02-18 NOTE — Consult Note (Signed)
Consult Note  Jeremy [name] 26-Mar-1981  [id number].    Requesting MD: Dr. Rush Landmark Chief Complaint/Reason for Consult: abscess  HPI:  42 y.o. 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  He first noted the right buttocks abscess about 5 days ago and saw his PCP yesterday.  He also has a right axillary abscess.  He was started on Bactrim by PCP for both of these.  No I&D performed.  He presents to the ED due to ongoing pain.  He missed dialysis yesterday due to pain from abscess.  He  has had to undergo incision and drainage of abscesses located buttock in the past. It seems more frequent when he changes soaps.  He denies fever, chills.  Workup in ED significant for CT scan showing abscess up to 6.4 cm in the inferior medial right buttock.  General surgery asked to see to evaluate for procedural management.  Substance use: Denies cigarette and alcohol use.  History of marijuana use Blood thinners: none   ROS: Reviewed and as above  Family History  Problem Relation Age of Onset   Diabetes Mellitus II Mother        died @ 42.   Gastric cancer Mother    CAD Father        died @ 16.   Heart attack Father    Congestive Heart Failure Father    Diabetes Mellitus II Sister    CAD Sister        s/p PCI - age 24.    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 01/2015 Lexiscan MV: EF 28%, inferior, inferolateral, apical ischemia;  b. 01/2015 Cath: nl cors, PCWP 18 mmHg, CO 9.38 L/min, CI 3.53 L/min/m^2.   CHF (congestive heart failure) (HCC)    Depression    Situational   ESRD (end stage renal disease) (HCC)    TTHS- [place]   Essential hypertension     Family history of adverse reaction to anesthesia    sister- "it was too much for her heart" died   GERD (gastroesophageal reflux disease)    Hyperlipidemia    Membranous glomerulonephritis    Morbid obesity (HCC)    Nonischemic cardiomyopathy (HCC)    a. 01/2015 Echo: EF 20-25%, diff HK, Gr 2 DD, Triv AI, mildly dil LA and Ao root.   PONV (postoperative nausea and vomiting)    Seizures (HCC) 02/29/2020   "electralytes were out of wack."   Type II diabetes mellitus (HCC)    a. 01/2015 HbA1c = 8.9.    Past Surgical History:  Procedure Laterality Date   A/V FISTULAGRAM N/A 08/12/2021   Procedure: A/V FISTULAGRAM;  Surgeon: Maeola Harman, MD;  Location: [facility] INVASIVE CV LAB;  Service: Cardiovascular;  Laterality: N/A;   AV FISTULA PLACEMENT Right 12/18/2020   Procedure: RIGHT ARM RADIOCEPHALIC  ARTERIOVENOUS (AV) FISTULA CREATION;  Surgeon: Maeola Harman, MD;  Location: [facility] OR;  Service: Vascular;  Laterality: Right;   CARDIAC CATHETERIZATION N/A 02/01/2015   Procedure: Right/Left Heart Cath and Coronary Angiography;  Surgeon: Wendall Stade, MD;  Location: [facility] INVASIVE CV LAB;  Service: Cardiovascular;  Laterality: N/A;   IR FLUORO GUIDE CV LINE LEFT  07/21/2022   IR FLUORO GUIDE CV LINE LEFT  07/21/2022   IR FLUORO GUIDE CV LINE RIGHT  10/11/2019   IR REMOVAL TUN CV CATH W/O FL  07/17/2022   IR REMOVAL TUN CV CATH W/O FL  08/18/2022   IR US GUIDE VASC ACCESS LEFT  07/21/2022   IR US GUIDE VASC ACCESS LEFT  07/21/2022   IR US GUIDE VASC ACCESS RIGHT  10/11/2019   TEE WITHOUT CARDIOVERSION N/A 07/18/2022   Procedure: TRANSESOPHAGEAL ECHOCARDIOGRAM (TEE);  Surgeon: Maisie Fus, MD;  Location: [facility] ENDOSCOPY;  Service: Cardiovascular;  Laterality: N/A;   THORACOTOMY Left 11/08/2013   Procedure: LEFT THORACOTOMY;  Surgeon: Kerin Perna, MD;  Location: [facility] OR;  Service: Thoracic;  Laterality: Left;    Social History:  reports that he has never smoked. He has never used  smokeless tobacco. He reports current drug use. Drug: Marijuana. He reports that he does not drink alcohol.  Allergies:  Allergies  Allergen Reactions   Acyclovir And Related Hives   Imdur [Isosorbide Nitrate]     Headaches from nitrate    (Not in a hospital admission)   Blood pressure (!) 123/53, pulse 86, temperature 98 F (36.7 C), resp. rate 17, height 6\' 5"  (1.956 m), weight 117.9 kg, SpO2 100 %. Physical Exam: General: pleasant, WD, obese male/male who is laying in bed in NAD HEENT: head is normocephalic, atraumatic.  Sclera are noninjected.  Pupils equal and round. EOMs intact.  Ears and nose without any masses or lesions.  Mouth is pink and moist Heart: regular, rate, and rhythm.  Normal s1,s2. No obvious murmurs, gallops, or rubs noted.  Palpable radial and pedal pulses bilaterally Lungs: CTAB, no wheezes, rhonchi, or rales noted.  Respiratory effort nonlabored Abd: soft, NT, ND, +BS, no masses, hernias, or organomegaly MSK: all 4 extremities are symmetrical with no cyanosis, clubbing, or edema. Skin: warm and dry with no masses, lesions, or rashes, moderate sized abscess over inferior buttock, moderate abscess over right axilla Neuro: Cranial nerves 2-12 grossly intact, sensation is normal throughout Psych: A&Ox3 with an appropriate affect.    Results for orders placed or performed during the hospital encounter of 02/18/23 (from the past 48 hour(s))  CBC with Differential     Status: Abnormal   Collection Time: 02/18/23  9:39 AM  Result Value Ref Range   WBC 11.4 (H) 4.0 - 10.5 K/uL   RBC 4.23 4.22 - 5.81 MIL/uL   Hemoglobin 11.6 (L) 13.0 - 17.0 g/dL   HCT 45.4 (L) 09.8 - 11.9 %   MCV 87.0 80.0 - 100.0 fL   MCH 27.4 26.0 - 34.0 pg   MCHC 31.5 30.0 - 36.0 g/dL   RDW 14.7 (H) 82.9 - 56.2 %   Platelets 218 150 - 400 K/uL   nRBC 0.0 0.0 - 0.2 %   Neutrophils Relative % 78 %   Neutro Abs 8.9 (H) 1.7 - 7.7 K/uL   Lymphocytes Relative 8 %   Lymphs Abs 0.9 0.7 - 4.0  K/uL   Monocytes Relative 11 %   Monocytes Absolute 1.3 (H) 0.1 - 1.0 K/uL   Eosinophils Relative 2 %   Eosinophils Absolute 0.2 0.0 - 0.5 K/uL   Basophils Relative 1 %   Basophils Absolute 0.1 0.0 - 0.1 K/uL   Immature Granulocytes 0 %   Abs Immature Granulocytes 0.03 0.00 - 0.07 K/uL    Comment:  Performed at [facility], [address], [place], [place] [address]  Basic metabolic panel     Status: Abnormal   Collection Time: 02/18/23  9:39 AM  Result Value Ref Range   Sodium 136 135 - 145 mmol/L   Potassium 5.0 3.5 - 5.1 mmol/L   Chloride 101 98 - 111 mmol/L   CO2 18 (L) 22 - 32 mmol/L   Glucose, Bld 169 (H) 70 - 99 mg/dL    Comment: Glucose reference range applies only to samples taken after fasting for at least 8 hours.   BUN 88 (H) 6 - 20 mg/dL   Creatinine, Ser 60.45 (H) 0.61 - 1.24 mg/dL   Calcium 9.3 8.9 - 40.9 mg/dL   GFR, Estimated 3 (L) >60 mL/min    Comment: (NOTE) Calculated using the CKD-EPI Creatinine Equation (2021)    Anion gap 17 (H) 5 - 15    Comment: Performed at [facility], [address], [place], [place] [address]  Lactic acid, plasma     Status: None   Collection Time: 02/18/23 10:30 AM  Result Value Ref Range   Lactic Acid, Venous 0.8 0.5 - 1.9 mmol/L    Comment: Performed at [facility], [address], [place], [place] [address]   CT ABDOMEN PELVIS W CONTRAST  Result Date: 02/18/2023 CLINICAL DATA:  Abscess within the perineum. EXAM: CT ABDOMEN AND PELVIS WITH CONTRAST TECHNIQUE: Multidetector CT imaging of the abdomen and pelvis was performed using the standard protocol following bolus administration of intravenous contrast. RADIATION DOSE REDUCTION: This exam was performed according to the departmental dose-optimization program which includes automated exposure control, adjustment of the mA and/or kV according to patient size and/or use of iterative reconstruction technique. CONTRAST:  75mL OMNIPAQUE IOHEXOL 350 MG/ML SOLN  COMPARISON:  AP chest 09/18/2022; CT abdomen pelvis 07/15/2022 FINDINGS: Lower chest: There is minimal curvilinear subsegmental atelectasis versus scarring within the lingula and anterior aspect of the left lower lobe, similar to prior. No pleural effusion. Mild scattered ground-glass opacities consistent with the airspace opacities seen on yesterday's chest radiograph. Partial visualization of central venous catheter tips terminating within the superior right atrium, unchanged. Hepatobiliary: Smooth liver contours. Layering small gallstones are again visualized. No definite gallbladder wall inflammatory change. No intrahepatic or extrahepatic biliary ductal dilatation. Pancreas: Unremarkable. No pancreatic ductal dilatation or surrounding inflammatory changes. Spleen: Normal in size without focal abnormality. Adrenals/Urinary Tract: Normal right adrenal gland. Unchanged 2.0 x 1.7 cm left adrenal nodule compared to 07/15/2022, nonspecific by internal Hounsfield unit density. This now demonstrates 7 months instability and is likely benign. The kidneys enhance uniformly are symmetric in size without hydronephrosis. A 2.5 cm fluid density benign cyst is seen within the lower pole of the left kidney, unchanged from prior. Additional two left lower pole and a single right lower pole subcentimeter low-density lesions are unchanged from prior, too small to further characterize but statistically most likely cysts. No follow-up imaging is recommended. No focal urinary bladder wall thickening is seen. Stomach/Bowel: Mild descending colon and sigmoid diverticulosis without inflammatory changes to indicate acute diverticulitis. The terminal ileum is unremarkable. Normal appendix (coronal series 6 images 54 through 67). No dilated loops of bowel are seen to indicate bowel obstruction. Vascular/Lymphatic: No abdominal aortic aneurysm. The major intra-abdominal aortic branch vessels are patent. No mesenteric, retroperitoneal, or  pelvic pathologically enlarged lymph nodes by CT criteria. Reproductive: The visualized prostate and seminal vesicles are unremarkable. Other: No ventral abdominal wall hernia. No  free air or free fluid is seen within the abdomen or pelvis. Within the inferior medial right buttock subcutaneous fat, just deep to the skin surface, there is an oval low-density collection suggesting complex fluid with moderately thick enhancing border, measuring up to approximately 3.1 x 5.3 x 6.4 cm (transverse by AP by craniocaudal as measured on axial image 117 and coronal image 94). There is moderate right and mild left buttock subcutaneous inflammatory fat stranding and posterior buttocks skin thickening. Musculoskeletal: Moderate anterior L4-5 disc space narrowing with moderate degenerative Schmorl's nodes and moderate to high-grade surrounding sclerosis. Moderate anterior L4-5 endplate osteophytes. There is a 9 mm degenerative Schmorl's node again seen within the mid transverse mid AP dimension of the inferior T10 endplate. IMPRESSION: 1. Within the inferior medial right buttock subcutaneous fat, there is an oval low-density collection measuring up to 6.4 cm with moderately thick enhancing border. This is compatible with an abscess. 2. Mild descending colon and sigmoid diverticulosis without inflammatory changes to indicate acute diverticulitis. 3. Cholelithiasis without CT evidence of acute cholecystitis. 4. Mild scattered bilateral lower lung ground-glass opacities consistent with the airspace opacities seen on yesterday's chest radiograph. Electronically Signed   By: Neita Garnet M.D.   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   [facility] Surgery 02/18/2023, 1:58 PM Please see Amion for pager number during day hours 7:00am-4:30pm

## 2023-02-18 NOTE — Progress Notes (Signed)
Pharmacy Antibiotic Note  Jeremy [name] is a 42 y.o. male admitted on 02/18/2023 with abscess in right buttocks region.  Pharmacy has been consulted for vancomycin and Zosyn dosing. PMH significant for ESRD-HD on TTS, last session Saturday 6/8.   Afebrile, wbc 11.4.   Plan: Vancomycin 2000 mg IV x1, followed by vancomycin 1000 mg IV post-HD on Tues, Thurs, Sat Zosyn 3.375 g IV x1, followed by Zosyn 2.25 g q8h  Monitor clinical progress and cultures  Monitor for HD schedule adjustments and missed antibiotic doses   Height: 6\' 5"  (195.6 cm) Weight: 117.9 kg (260 lb) IBW/kg (Calculated) : 89.1  Temp (24hrs), Avg:98.4 F (36.9 C), Min:98.4 F (36.9 C), Max:98.4 F (36.9 C)  Recent Labs  Lab 02/18/23 0939  CREATININE 15.96*    Estimated Creatinine Clearance: 8.6 mL/min (A) (by C-G formula based on SCr of 15.96 mg/dL (H)).    Allergies  Allergen Reactions   Acyclovir And Related Hives   Imdur [Isosorbide Nitrate]     Headaches from nitrate    Antimicrobials this admission: 6/12 vancomycin >>  6/12 Zosyn >>   Dose adjustments this admission: NA  Microbiology results: 6/12 BCx: sent  Thank you for allowing pharmacy to be a part of this patient's care.  Andreas Ohm, PharmD Pharmacy Resident  02/18/2023 11:25 AM

## 2023-02-18 NOTE — ED Provider Notes (Signed)
[place] EMERGENCY DEPARTMENT AT [facility] Provider Note   CSN: [id number] Arrival date & time: 02/18/23  0848     History  Chief Complaint  Patient presents with   Abscess    Chapman Matteucci is a 42 y.o. male with a past medical history significant for type 2 diabetes, asthma, history of recurrent skin infections, seizures, depression, CHF, ESRD on dialysis Tuesday/Thursday/Saturday who presents to the ED due to abscess to right buttocks region.  Abscess has been present for roughly 5 days.  Notes it is the size of a tennis ball.  Tracks into the perineum region. Does not believe it is located in scrotum. Denies any difficulties with bowel movements or urination.  No fever or chills.  Patient seen by PCP yesterday for buttocks abscess and right axilla abscess and was started on Bactrim.  No I&D was performed at this time due to his history of diabetes.  Patient has a history of recurrent abscesses that have required I&D.  Patient missed dialysis yesterday due to significant pain and inability to sit.  History obtained from patient and past medical records. No interpreter used during encounter.       Home Medications Prior to Admission medications   Medication Sig Start Date End Date Taking? Authorizing Provider  Accu-Chek Softclix Lancets lancets Use as directed to check blood sugar four times daily. 02/24/22   Marolyn Haller, MD  acetaminophen (TYLENOL) 500 MG tablet Take 1,000 mg by mouth every 6 (six) hours as needed for moderate pain.    [provider]  amLODipine (NORVASC) 10 MG tablet Take 1 tablet (10 mg total) by mouth daily. 01/10/22   Tyson Alias, MD  atorvastatin (LIPITOR) 80 MG tablet Take 1 tablet (80 mg total) by mouth daily. 01/06/23   Bensimhon, Bevelyn Buckles, MD  AURYXIA 1 GM 210 MG(Fe) tablet Take 420 mg by mouth 3 (three) times daily. 08/05/21   [provider]  blood glucose meter kit and supplies KIT Dispense based on patient  and insurance preference. Use up to four times daily as directed. (FOR ICD-9 250.00, 250.01). 07/21/18   Angelita Ingles, MD  Blood Glucose Monitoring Suppl (ACCU-CHEK GUIDE) w/Device KIT Use to test blood sugar 02/24/22   Marolyn Haller, MD  Blood Pressure Monitoring (FORA P20 BLOOD PRESSURE CUFF) MISC Check your blood pressure everyday in the morning. 02/24/22   Marolyn Haller, MD  carvedilol (COREG) 25 MG tablet Take 2 tablets (50 mg total) by mouth 2 (two) times daily. 11/10/22   Laurey Morale, MD  cloNIDine (CATAPRES) 0.3 MG tablet Take 1 tablet (0.3 mg total) by mouth 2 (two) times daily as directed. 09/30/22     Doxercalciferol (HECTOROL IV) Inject 1 Dose into the vein Every Tuesday,Thursday,and Saturday with dialysis. 06/05/22 06/04/23  [provider]  ferric citrate (AURYXIA) 1 GM 210 MG(Fe) tablet Take 2 tablets (420 mg total) by mouth as directed with meals and 2 tablets (420 mg total) with snacks. 10/14/22     ferric citrate (AURYXIA) 1 GM 210 MG(Fe) tablet Take 2 tablets (420 mg total) by mouth with each meal and 2 tablets (420 mg total) with snacks. 10/14/22     glipiZIDE (GLUCOTROL) 5 MG tablet Take 1 tablet (5 mg total) by mouth daily before breakfast. 10/01/22   Marolyn Haller, MD  glucose blood (ACCU-CHEK GUIDE) test strip USE TO CHECK BLOOD SUGAR 4 TIMES DAILY 02/24/22   Marolyn Haller, MD  hydrALAZINE (APRESOLINE) 100 MG tablet Take 1  tablet (100 mg total) by mouth 3 (three) times daily. 01/31/22   Bensimhon, Bevelyn Buckles, MD  insulin aspart (NOVOLOG FLEXPEN) 100 UNIT/ML FlexPen Inject 15 Units into the skin 3 (three) times daily with meals. Patient taking differently: Inject 17 Units into the skin 3 (three) times daily with meals. 03/25/22 11/10/22  Masters, Katie, DO  insulin glargine-yfgn (SEMGLEE, YFGN,) 100 UNIT/ML Pen Inject 20 Units into the skin at bedtime. 09/19/22   Masters, Katie, DO  Insulin Pen Needle (PEN NEEDLES) 31G X 5 MM MISC Use four times daily with  insulin. 08/26/22   Marolyn Haller, MD  multivitamin (RENA-VIT) TABS tablet Take 1 tablet by mouth daily.    [provider]  nitroGLYCERIN (NITROSTAT) 0.4 MG SL tablet PLACE 1 TABLET UNDER THE TONGUE EVERY 5 MINUTES AS NEEDED FOR CHEST PAIN. Patient not taking: Reported on 09/26/2022 06/28/19   Laurey Morale, MD  ondansetron (ZOFRAN) 4 MG tablet Take 1 tablet (4 mg total) by mouth daily as needed. 07/23/22     ondansetron (ZOFRAN) 4 MG tablet Take 1 tablet (4 mg total) by mouth daily as needed. 10/27/22     RENVELA 800 MG tablet Take 800 mg by mouth daily. 07/28/21   [provider]  sulfamethoxazole-trimethoprim (BACTRIM DS) 800-160 MG tablet Take 1 tablet by mouth 2 (two) times daily for 5 days 02/17/23         Allergies    Acyclovir and related and Imdur [isosorbide nitrate]    Review of Systems   Review of Systems  Constitutional:  Negative for chills and fever.  Gastrointestinal:  Negative for abdominal pain.  Genitourinary:  Negative for scrotal swelling and testicular pain.  Skin:  Positive for color change and wound.    Physical Exam Updated Vital Signs BP (!) 123/53   Pulse 86   Temp 98 F (36.7 C)   Resp 17   Ht 6\' 5"  (1.956 m)   Wt 117.9 kg   SpO2 100%   BMI 30.83 kg/m  Physical Exam Vitals and nursing note reviewed.  Constitutional:      General: He is not in acute distress.    Appearance: He is not ill-appearing.  HENT:     Head: Normocephalic.  Eyes:     Pupils: Pupils are equal, round, and reactive to light.  Cardiovascular:     Rate and Rhythm: Normal rate and regular rhythm.     Pulses: Normal pulses.     Heart sounds: Normal heart sounds. No murmur heard.    No friction rub. No gallop.  Pulmonary:     Effort: Pulmonary effort is normal.     Breath sounds: Normal breath sounds.  Abdominal:     General: Abdomen is flat. There is no distension.     Palpations: Abdomen is soft.     Tenderness: There is no abdominal tenderness.  There is no guarding or rebound.  Musculoskeletal:        General: Normal range of motion.     Cervical back: Neck supple.  Skin:    General: Skin is warm and dry.     Comments: Induration with central fluctuance in right axilla.   Neurological:     General: No focal deficit present.     Mental Status: He is alert.  Psychiatric:        Mood and Affect: Mood normal.        Behavior: Behavior normal.     ED Results / Procedures / Treatments  Labs (all labs ordered are listed, but only abnormal results are displayed) Labs Reviewed  CBC WITH DIFFERENTIAL/PLATELET - Abnormal; Notable for the following components:      Result Value   WBC 11.4 (*)    Hemoglobin 11.6 (*)    HCT 36.8 (*)    RDW 16.3 (*)    Neutro Abs 8.9 (*)    Monocytes Absolute 1.3 (*)    All other components within normal limits  BASIC METABOLIC PANEL - Abnormal; Notable for the following components:   CO2 18 (*)    Glucose, Bld 169 (*)    BUN 88 (*)    Creatinine, Ser 15.96 (*)    GFR, Estimated 3 (*)    Anion gap 17 (*)    All other components within normal limits  CULTURE, BLOOD (ROUTINE X 2)  CULTURE, BLOOD (ROUTINE X 2)  LACTIC ACID, PLASMA  LACTIC ACID, PLASMA  HEPATIC FUNCTION PANEL  LIPASE, BLOOD    EKG None  Radiology CT ABDOMEN PELVIS W CONTRAST  Result Date: 02/18/2023 CLINICAL DATA:  Abscess within the perineum. EXAM: CT ABDOMEN AND PELVIS WITH CONTRAST TECHNIQUE: Multidetector CT imaging of the abdomen and pelvis was performed using the standard protocol following bolus administration of intravenous contrast. RADIATION DOSE REDUCTION: This exam was performed according to the departmental dose-optimization program which includes automated exposure control, adjustment of the mA and/or kV according to patient size and/or use of iterative reconstruction technique. CONTRAST:  75mL OMNIPAQUE IOHEXOL 350 MG/ML SOLN COMPARISON:  AP chest 09/18/2022; CT abdomen pelvis 07/15/2022 FINDINGS: Lower  chest: There is minimal curvilinear subsegmental atelectasis versus scarring within the lingula and anterior aspect of the left lower lobe, similar to prior. No pleural effusion. Mild scattered ground-glass opacities consistent with the airspace opacities seen on yesterday's chest radiograph. Partial visualization of central venous catheter tips terminating within the superior right atrium, unchanged. Hepatobiliary: Smooth liver contours. Layering small gallstones are again visualized. No definite gallbladder wall inflammatory change. No intrahepatic or extrahepatic biliary ductal dilatation. Pancreas: Unremarkable. No pancreatic ductal dilatation or surrounding inflammatory changes. Spleen: Normal in size without focal abnormality. Adrenals/Urinary Tract: Normal right adrenal gland. Unchanged 2.0 x 1.7 cm left adrenal nodule compared to 07/15/2022, nonspecific by internal Hounsfield unit density. This now demonstrates 7 months instability and is likely benign. The kidneys enhance uniformly are symmetric in size without hydronephrosis. A 2.5 cm fluid density benign cyst is seen within the lower pole of the left kidney, unchanged from prior. Additional two left lower pole and a single right lower pole subcentimeter low-density lesions are unchanged from prior, too small to further characterize but statistically most likely cysts. No follow-up imaging is recommended. No focal urinary bladder wall thickening is seen. Stomach/Bowel: Mild descending colon and sigmoid diverticulosis without inflammatory changes to indicate acute diverticulitis. The terminal ileum is unremarkable. Normal appendix (coronal series 6 images 54 through 67). No dilated loops of bowel are seen to indicate bowel obstruction. Vascular/Lymphatic: No abdominal aortic aneurysm. The major intra-abdominal aortic branch vessels are patent. No mesenteric, retroperitoneal, or pelvic pathologically enlarged lymph nodes by CT criteria. Reproductive: The  visualized prostate and seminal vesicles are unremarkable. Other: No ventral abdominal wall hernia. No free air or free fluid is seen within the abdomen or pelvis. Within the inferior medial right buttock subcutaneous fat, just deep to the skin surface, there is an oval low-density collection suggesting complex fluid with moderately thick enhancing border, measuring up to approximately 3.1 x 5.3 x 6.4 cm (transverse by  AP by craniocaudal as measured on axial image 117 and coronal image 94). There is moderate right and mild left buttock subcutaneous inflammatory fat stranding and posterior buttocks skin thickening. Musculoskeletal: Moderate anterior L4-5 disc space narrowing with moderate degenerative Schmorl's nodes and moderate to high-grade surrounding sclerosis. Moderate anterior L4-5 endplate osteophytes. There is a 9 mm degenerative Schmorl's node again seen within the mid transverse mid AP dimension of the inferior T10 endplate. IMPRESSION: 1. Within the inferior medial right buttock subcutaneous fat, there is an oval low-density collection measuring up to 6.4 cm with moderately thick enhancing border. This is compatible with an abscess. 2. Mild descending colon and sigmoid diverticulosis without inflammatory changes to indicate acute diverticulitis. 3. Cholelithiasis without CT evidence of acute cholecystitis. 4. Mild scattered bilateral lower lung ground-glass opacities consistent with the airspace opacities seen on yesterday's chest radiograph. Electronically Signed   By: Neita Garnet M.D.   On: 02/18/2023 13:25    Procedures Procedures    Medications Ordered in ED Medications  vancomycin (VANCOREADY) IVPB 2000 mg/400 mL (2,000 mg Intravenous New Bag/Given 02/18/23 1238)  piperacillin-tazobactam (ZOSYN) IVPB 2.25 g (has no administration in time range)  vancomycin (VANCOCIN) IVPB 1000 mg/200 mL premix (has no administration in time range)  HYDROmorphone (DILAUDID) injection 0.5 mg (0.5 mg  Intravenous Given 02/18/23 1024)  piperacillin-tazobactam (ZOSYN) IVPB 3.375 g (0 g Intravenous Stopped 02/18/23 1213)  iohexol (OMNIPAQUE) 350 MG/ML injection 75 mL (75 mLs Intravenous Contrast Given 02/18/23 1229)    ED Course/ Medical Decision Making/ A&P                             Medical Decision Making Amount and/or Complexity of Data Reviewed Labs: ordered. Decision-making details documented in ED Course. Radiology: ordered and independent interpretation performed. Decision-making details documented in ED Course.  Risk Prescription drug management.   This patient presents to the ED for concern of erythema and edema to buttocks, this involves an extensive number of treatment options, and is a complaint that carries with it a high risk of complications and morbidity.  The differential diagnosis includes abscess, cellulitis, fournier's, etc  42 year old male presents to the ED due to right buttocks abscess that is been present for the past 3 to 5 days.  History of same.  Also has a abscess in right axilla region.  History of diabetes and ESRD.  Missed dialysis on Tuesday due to significant pain.  No fever or chills.  Denies difficulties with bowel movements or urination.  Patient seen by PCP yesterday and prescribed Bactrim.  Upon arrival patient afebrile, not tachycardic or hypoxic.  Patient in no acute distress. Rectal exam performed with chaperone in room.  Area of induration that tracks from right buttocks region into the perineum. Does not appear to go into rectum.  Does not involve the scrotum. Abscess to right axilla region. Labs ordered. Lactic acid and blood cultures.  CT abdomen to investigate location of abscess and extensiveness and to rule out Fournier's. IV antibiotics started.   9:45 AM discussed with radiology who recommends CT scan with contrast.  Will discuss with nephrology to arrange for dialysis given patient missed dialysis yesterday.  CBC significant for leukocytosis  11.4.  Anemia with hemoglobin 11.6.  BMP significant for elevated creatinine and BUN secondary to ESRD.  Glucose elevated 169.  Slight anion gap.  Lactic acid normal.  CT abdomen personally reviewed and interpreted which demonstrates a 6.4 cm abscess to  medial right buttock region.  Evidence of diverticulosis without diverticulitis.  Does also demonstrate cholelithiasis without evidence of acute cholecystitis.  No right upper quadrant or epigastric tenderness.  Added hepatic function panel and lipase.  Low suspicion for acute cholecystitis given benign exam.  1:43 PM Discussed with Dr. Sheliah Hatch with general surgery who will come see patient. Will hold off on I&D of axilla region until surgery evaluation.   Discussed with Dr. Sheliah Hatch with general surgery who will perform I&D at bedside. Advised patient to continue taking Bactrim as previous prescribed. No infectious symptoms to suggest pneumonia. Advised patient to call the dialysis center tomorrow to schedule a session. Strict ED precautions discussed with patient. Patient states understanding and agrees to plan. Patient discharged home in no acute distress and stable vitals  Discussed with Dr. Rush Landmark who agrees with assessment and plan.       Final Clinical Impression(s) / ED Diagnoses Final diagnoses:  Abscess    Rx / DC Orders ED Discharge Orders     None         Jesusita Oka 02/18/23 1503    Tegeler, Canary Brim, MD 02/18/23 1558

## 2023-02-19 ENCOUNTER — Telehealth: Payer: Self-pay | Admitting: *Deleted

## 2023-02-19 DIAGNOSIS — N186 End stage renal disease: Secondary | ICD-10-CM | POA: Diagnosis not present

## 2023-02-19 DIAGNOSIS — D689 Coagulation defect, unspecified: Secondary | ICD-10-CM | POA: Diagnosis not present

## 2023-02-19 DIAGNOSIS — D509 Iron deficiency anemia, unspecified: Secondary | ICD-10-CM | POA: Diagnosis not present

## 2023-02-19 DIAGNOSIS — N2581 Secondary hyperparathyroidism of renal origin: Secondary | ICD-10-CM | POA: Diagnosis not present

## 2023-02-19 DIAGNOSIS — R7881 Bacteremia: Secondary | ICD-10-CM | POA: Diagnosis not present

## 2023-02-19 DIAGNOSIS — Z992 Dependence on renal dialysis: Secondary | ICD-10-CM | POA: Diagnosis not present

## 2023-02-19 NOTE — Transitions of Care (Post Inpatient/ED Visit) (Signed)
02/19/2023  Name: Jeremy [name] MRN: [id number] DOB: [birth year]  Today's TOC FU Call Status: Today's TOC FU Call Status:: Successful TOC FU Call Competed TOC FU Call Complete Date: 02/19/23  Transition Care Management Follow-up Telephone Call Date of Discharge: 02/18/23 Discharge Facility: [facility]) Type of Discharge: Emergency Department Reason for ED Visit: Other: (abscess) How have you been since you were released from the hospital?: Better Any questions or concerns?: No  Items Reviewed: Did you receive and understand the discharge instructions provided?: Yes Medications obtained,verified, and reconciled?: Yes (Medications Reviewed) Any new allergies since your discharge?: No Dietary orders reviewed?: NA Do you have support at home?: Yes People in Home: significant other Name of Support/Comfort Primary Source: Jeremy [name]  Medications Reviewed Today: Medications Reviewed Today     Reviewed by Heidi Dach, RN (Registered Nurse) on 02/19/23 at 1119  Med List Status: <None>   Medication Order Taking? Sig Documenting Provider Last Dose Status Informant  Accu-Chek Softclix Lancets lancets [id number] No Use as directed to check blood sugar four times daily.  Patient not taking: Reported on 02/19/2023   Marolyn Haller, MD Not Taking Active Self, Pharmacy Records  acetaminophen (TYLENOL) 500 MG tablet [id number] Yes Take 1,000 mg by mouth every 6 (six) hours as needed for moderate pain. [provider] Taking Active Self, Pharmacy Records  amLODipine (NORVASC) 10 MG tablet [id number] Yes Take 1 tablet (10 mg total) by mouth daily. Tyson Alias, MD Taking Active Self, Pharmacy Records  atorvastatin (LIPITOR) 80 MG tablet [id number] Yes Take 1 tablet (80 mg total) by mouth daily. Bensimhon, Bevelyn Buckles, MD Taking Active   AURYXIA 1 GM 210 MG(Fe) tablet [id number] Yes Take 420 mg by mouth 3 (three) times daily. [provider] Taking Consider Medication  Status and Discontinue (Patient Preference) Self, Pharmacy Records           Med Note Jola Schmidt   Tue Jul 15, 2022  7:33 PM)    blood glucose meter kit and supplies KIT [id number] No Dispense based on patient and insurance preference. Use up to four times daily as directed. (FOR ICD-9 250.00, 250.01).  Patient not taking: Reported on 02/19/2023   Angelita Ingles, MD Not Taking Active Self, Pharmacy Records  Blood Glucose Monitoring Suppl (ACCU-CHEK GUIDE) w/Device KIT [id number] No Use to test blood sugar  Patient not taking: Reported on 02/19/2023   Marolyn Haller, MD Not Taking Active Self, Pharmacy Records  Blood Pressure Monitoring (FORA P20 BLOOD PRESSURE CUFF) MISC [id number] Yes Check your blood pressure everyday in the morning. Marolyn Haller, MD Taking Active Self, Pharmacy Records  carvedilol (COREG) 25 MG tablet [id number] Yes Take 2 tablets (50 mg total) by mouth 2 (two) times daily. Laurey Morale, MD Taking Active   cloNIDine (CATAPRES) 0.3 MG tablet [id number] Yes Take 1 tablet (0.3 mg total) by mouth 2 (two) times daily as directed.  Taking Active   Doxercalciferol (HECTOROL IV) [id number] Yes Inject 1 Dose into the vein Every Tuesday,Thursday,and Saturday with dialysis. [provider] Taking Active Self, Pharmacy Records  ferric citrate (AURYXIA) 1 GM 210 MG(Fe) tablet [id number] Yes Take 2 tablets (420 mg total) by mouth as directed with meals and 2 tablets (420 mg total) with snacks.  Taking Active   ferric citrate (AURYXIA) 1 GM 210 MG(Fe) tablet [id number] Yes Take 2 tablets (420 mg total) by mouth with each meal and 2 tablets (420 mg total) with snacks.  Taking Active   glipiZIDE (GLUCOTROL) 5  MG tablet [id number] Yes Take 1 tablet (5 mg total) by mouth daily before breakfast. Marolyn Haller, MD Taking Active   glucose blood (ACCU-CHEK GUIDE) test strip [id number] No USE TO CHECK BLOOD SUGAR 4 TIMES DAILY  Patient not taking: Reported on 02/19/2023    Marolyn Haller, MD Not Taking Active Self, Pharmacy Records  hydrALAZINE (APRESOLINE) 100 MG tablet [id number] Yes Take 1 tablet (100 mg total) by mouth 3 (three) times daily. Bensimhon, Bevelyn Buckles, MD Taking Active Self, Pharmacy Records  insulin aspart (NOVOLOG FLEXPEN) 100 UNIT/ML FlexPen [id number] Yes Inject 15 Units into the skin 3 (three) times daily with meals.  Patient taking differently: Inject 17 Units into the skin 3 (three) times daily with meals.   Masters, Florentina Addison, DO Taking Active Self, Pharmacy Records  insulin glargine-yfgn [facility], YFGN,) 100 UNIT/ML Pen [id number] Yes Inject 20 Units into the skin at bedtime. Masters, Psychiatric nurse, DO Taking Active   Insulin Pen Needle (PEN NEEDLES) 31G X 5 MM MISC [id number] Yes Use four times daily with insulin. Marolyn Haller, MD Taking Active   multivitamin (RENA-VIT) TABS tablet [id number] Yes Take 1 tablet by mouth daily. [provider] Taking Active Self, Pharmacy Records  nitroGLYCERIN (NITROSTAT) 0.4 MG SL tablet [id number] No PLACE 1 TABLET UNDER THE TONGUE EVERY 5 MINUTES AS NEEDED FOR CHEST PAIN.  Patient not taking: Reported on 09/26/2022   Laurey Morale, MD Not Taking Active Self, Pharmacy Records           Med Note Burnis Kingfisher H   Tue Jul 02, 2021  8:15 PM)    ondansetron [facility]) 4 MG tablet [id number] Yes Take 1 tablet (4 mg total) by mouth daily as needed.  Taking Active   ondansetron (ZOFRAN) 4 MG tablet [id number] Yes Take 1 tablet (4 mg total) by mouth daily as needed.  Taking Active   RENVELA 800 MG tablet [id number] Yes Take 800 mg by mouth daily. [provider] Taking Active Self, Pharmacy Records           Med Note (WHITE, Karlene Lineman Sep 18, 2022  7:38 AM) Patient confirmed  sulfamethoxazole-trimethoprim (BACTRIM DS) 800-160 MG tablet [id number] Yes Take 1 tablet by mouth 2 (two) times daily for 5 days  Taking Active             Home Care and Equipment/Supplies: Were Home Health Services Ordered?:  NA Any new equipment or medical supplies ordered?: NA  Functional Questionnaire: Do you need assistance with bathing/showering or dressing?: No Do you need assistance with meal preparation?: No Do you need assistance with eating?: No Do you have difficulty maintaining continence: No Do you need assistance with getting out of bed/getting out of a chair/moving?: No Do you have difficulty managing or taking your medications?: No  Follow up appointments reviewed: PCP Follow-up appointment confirmed?: NA Specialist Hospital Follow-up appointment confirmed?: No Reason Specialist Follow-Up Not Confirmed: Patient has Specialist Provider Number and will Call for Appointment Do you need transportation to your follow-up appointment?: No Do you understand care options if your condition(s) worsen?: Yes-patient verbalized understanding  SDOH Interventions Today    Flowsheet Row Most Recent Value  SDOH Interventions   Transportation Interventions Intervention Not Indicated      The patient was given information about care management services as a benefit of their Medicaid health plan today.    Patient  agreed to services and verbal consent obtained.   RNCM scheduled initial telephone outreach on 02/24/23 @ 2:30 pm.   Estanislado Emms RN, BSN [place]  Managed [facility] RN Care Coordinator [phone]

## 2023-02-21 DIAGNOSIS — N2581 Secondary hyperparathyroidism of renal origin: Secondary | ICD-10-CM | POA: Diagnosis not present

## 2023-02-21 DIAGNOSIS — D509 Iron deficiency anemia, unspecified: Secondary | ICD-10-CM | POA: Diagnosis not present

## 2023-02-21 DIAGNOSIS — N186 End stage renal disease: Secondary | ICD-10-CM | POA: Diagnosis not present

## 2023-02-21 DIAGNOSIS — R7881 Bacteremia: Secondary | ICD-10-CM | POA: Diagnosis not present

## 2023-02-21 DIAGNOSIS — D689 Coagulation defect, unspecified: Secondary | ICD-10-CM | POA: Diagnosis not present

## 2023-02-21 DIAGNOSIS — Z992 Dependence on renal dialysis: Secondary | ICD-10-CM | POA: Diagnosis not present

## 2023-02-23 ENCOUNTER — Other Ambulatory Visit: Payer: Medicaid Other | Admitting: *Deleted

## 2023-02-23 ENCOUNTER — Other Ambulatory Visit (HOSPITAL_COMMUNITY): Payer: Self-pay

## 2023-02-23 LAB — CULTURE, BLOOD (ROUTINE X 2)
Culture: NO GROWTH
Culture: NO GROWTH
Special Requests: ADEQUATE

## 2023-02-23 NOTE — Patient Outreach (Signed)
  Medicaid Managed Care   Unsuccessful Attempt Note   02/23/2023 Name: Suhaan Jaques MRN: [id number] DOB: Jan 18, 1981  Referred by: Marolyn Haller, MD Reason for referral : High Risk Managed Medicaid (Unsuccessful RNCM initial telephone outreach)   An unsuccessful telephone outreach was attempted today. The patient was referred to the case management team for assistance with care management and care coordination.    Follow Up Plan: A HIPAA compliant phone message was left for the patient providing contact information and requesting a return call. and The Managed Medicaid care management team will reach out to the patient again over the next 7 days.    Estanislado Emms RN, BSN [place]  Managed [facility] RN Care Coordinator [phone]

## 2023-02-23 NOTE — Patient Instructions (Signed)
Visit Information  [name]  - as a part of your Medicaid benefit, you are eligible for care management and care coordination services at no cost or copay. I was unable to reach you by phone today but would be happy to help you with your health related needs. Please feel free to call me @ [phone].   A member of the Managed Medicaid care management team will reach out to you again over the next 7 days.   Estanislado Emms RN, BSN [place]  Managed [facility] RN Care Coordinator [phone]

## 2023-03-03 DIAGNOSIS — R7881 Bacteremia: Secondary | ICD-10-CM | POA: Diagnosis not present

## 2023-03-03 DIAGNOSIS — N186 End stage renal disease: Secondary | ICD-10-CM | POA: Diagnosis not present

## 2023-03-03 DIAGNOSIS — D509 Iron deficiency anemia, unspecified: Secondary | ICD-10-CM | POA: Diagnosis not present

## 2023-03-03 DIAGNOSIS — Z992 Dependence on renal dialysis: Secondary | ICD-10-CM | POA: Diagnosis not present

## 2023-03-03 DIAGNOSIS — D689 Coagulation defect, unspecified: Secondary | ICD-10-CM | POA: Diagnosis not present

## 2023-03-03 DIAGNOSIS — N2581 Secondary hyperparathyroidism of renal origin: Secondary | ICD-10-CM | POA: Diagnosis not present

## 2023-03-04 ENCOUNTER — Encounter: Payer: Self-pay | Admitting: Dietician

## 2023-03-05 DIAGNOSIS — N186 End stage renal disease: Secondary | ICD-10-CM | POA: Diagnosis not present

## 2023-03-05 DIAGNOSIS — D509 Iron deficiency anemia, unspecified: Secondary | ICD-10-CM | POA: Diagnosis not present

## 2023-03-05 DIAGNOSIS — R7881 Bacteremia: Secondary | ICD-10-CM | POA: Diagnosis not present

## 2023-03-05 DIAGNOSIS — N2581 Secondary hyperparathyroidism of renal origin: Secondary | ICD-10-CM | POA: Diagnosis not present

## 2023-03-05 DIAGNOSIS — D689 Coagulation defect, unspecified: Secondary | ICD-10-CM | POA: Diagnosis not present

## 2023-03-05 DIAGNOSIS — Z992 Dependence on renal dialysis: Secondary | ICD-10-CM | POA: Diagnosis not present

## 2023-03-07 DIAGNOSIS — R7881 Bacteremia: Secondary | ICD-10-CM | POA: Diagnosis not present

## 2023-03-07 DIAGNOSIS — D509 Iron deficiency anemia, unspecified: Secondary | ICD-10-CM | POA: Diagnosis not present

## 2023-03-07 DIAGNOSIS — N2581 Secondary hyperparathyroidism of renal origin: Secondary | ICD-10-CM | POA: Diagnosis not present

## 2023-03-07 DIAGNOSIS — D689 Coagulation defect, unspecified: Secondary | ICD-10-CM | POA: Diagnosis not present

## 2023-03-07 DIAGNOSIS — N186 End stage renal disease: Secondary | ICD-10-CM | POA: Diagnosis not present

## 2023-03-07 DIAGNOSIS — Z992 Dependence on renal dialysis: Secondary | ICD-10-CM | POA: Diagnosis not present

## 2023-03-08 DIAGNOSIS — E1129 Type 2 diabetes mellitus with other diabetic kidney complication: Secondary | ICD-10-CM | POA: Diagnosis not present

## 2023-03-08 DIAGNOSIS — N186 End stage renal disease: Secondary | ICD-10-CM | POA: Diagnosis not present

## 2023-03-08 DIAGNOSIS — Z992 Dependence on renal dialysis: Secondary | ICD-10-CM | POA: Diagnosis not present

## 2023-03-09 ENCOUNTER — Other Ambulatory Visit: Payer: Medicaid Other | Admitting: *Deleted

## 2023-03-09 NOTE — Patient Outreach (Signed)
  Medicaid Managed Care   Unsuccessful Attempt Note   03/09/2023 Name: Jeremy [name] MRN: [id number] DOB: 03/03/1981  Referred by: Manuela Neptune, MD Reason for referral : High Risk Managed Medicaid (Unsuccessful RNCM initial telephone outreach)   A second unsuccessful telephone outreach was attempted today. The patient was referred to the case management team for assistance with care management and care coordination.    Follow Up Plan: A HIPAA compliant phone message was left for the patient providing contact information and requesting a return call. and The Managed Medicaid care management team will reach out to the patient again over the next 7 days.    Estanislado Emms RN, BSN [place]  Managed [facility] RN Care Coordinator [phone]

## 2023-03-09 NOTE — Patient Instructions (Signed)
Visit Information  Mr. Linton Segoviano  - as a part of your Medicaid benefit, you are eligible for care management and care coordination services at no cost or copay. I was unable to reach you by phone today but would be happy to help you with your health related needs. Please feel free to call me @ [phone].   A member of the Managed Medicaid care management team will reach out to you again over the next 7 days.   Elster Corbello RN, BSN [place]  Managed Medicaid RN Care Coordinator [phone]   

## 2023-03-10 DIAGNOSIS — R7881 Bacteremia: Secondary | ICD-10-CM | POA: Diagnosis not present

## 2023-03-10 DIAGNOSIS — N2581 Secondary hyperparathyroidism of renal origin: Secondary | ICD-10-CM | POA: Diagnosis not present

## 2023-03-10 DIAGNOSIS — N185 Chronic kidney disease, stage 5: Secondary | ICD-10-CM | POA: Diagnosis not present

## 2023-03-10 DIAGNOSIS — Z992 Dependence on renal dialysis: Secondary | ICD-10-CM | POA: Diagnosis not present

## 2023-03-10 DIAGNOSIS — N186 End stage renal disease: Secondary | ICD-10-CM | POA: Diagnosis not present

## 2023-03-10 DIAGNOSIS — D509 Iron deficiency anemia, unspecified: Secondary | ICD-10-CM | POA: Diagnosis not present

## 2023-03-10 DIAGNOSIS — D689 Coagulation defect, unspecified: Secondary | ICD-10-CM | POA: Diagnosis not present

## 2023-03-13 DIAGNOSIS — D509 Iron deficiency anemia, unspecified: Secondary | ICD-10-CM | POA: Diagnosis not present

## 2023-03-13 DIAGNOSIS — N186 End stage renal disease: Secondary | ICD-10-CM | POA: Diagnosis not present

## 2023-03-13 DIAGNOSIS — R7881 Bacteremia: Secondary | ICD-10-CM | POA: Diagnosis not present

## 2023-03-13 DIAGNOSIS — N2581 Secondary hyperparathyroidism of renal origin: Secondary | ICD-10-CM | POA: Diagnosis not present

## 2023-03-13 DIAGNOSIS — N185 Chronic kidney disease, stage 5: Secondary | ICD-10-CM | POA: Diagnosis not present

## 2023-03-13 DIAGNOSIS — D689 Coagulation defect, unspecified: Secondary | ICD-10-CM | POA: Diagnosis not present

## 2023-03-13 DIAGNOSIS — Z992 Dependence on renal dialysis: Secondary | ICD-10-CM | POA: Diagnosis not present

## 2023-03-16 ENCOUNTER — Telehealth: Payer: Self-pay

## 2023-03-16 NOTE — Telephone Encounter (Signed)
..   Medicaid Managed Care   Unsuccessful Outreach Note  03/16/2023 Name: Jeremy [name] MRN: [id number] DOB: 02/05/1981  Referred by: Manuela Neptune, MD Reason for referral : Appointment   Third unsuccessful telephone outreach was attempted today. The patient was referred to the case management team for assistance with care management and care coordination. The patient's primary care provider has been notified of our unsuccessful attempts to make or maintain contact with the patient. The care management team is pleased to engage with this patient at any time in the future should he/she be interested in assistance from the care management team.   Follow Up Plan: We have been unable to make contact with the patient for follow up. The care management team is available to follow up with the patient after provider conversation with the patient regarding recommendation for care management engagement and subsequent re-referral to the care management team.   Weston Settle Care Guide  [facility] Managed  Care Guide [facility]  [phone]

## 2023-03-17 DIAGNOSIS — D509 Iron deficiency anemia, unspecified: Secondary | ICD-10-CM | POA: Diagnosis not present

## 2023-03-17 DIAGNOSIS — Z992 Dependence on renal dialysis: Secondary | ICD-10-CM | POA: Diagnosis not present

## 2023-03-17 DIAGNOSIS — R7881 Bacteremia: Secondary | ICD-10-CM | POA: Diagnosis not present

## 2023-03-17 DIAGNOSIS — N186 End stage renal disease: Secondary | ICD-10-CM | POA: Diagnosis not present

## 2023-03-17 DIAGNOSIS — N2581 Secondary hyperparathyroidism of renal origin: Secondary | ICD-10-CM | POA: Diagnosis not present

## 2023-03-17 DIAGNOSIS — D689 Coagulation defect, unspecified: Secondary | ICD-10-CM | POA: Diagnosis not present

## 2023-03-19 ENCOUNTER — Other Ambulatory Visit (HOSPITAL_COMMUNITY): Payer: Self-pay

## 2023-03-19 DIAGNOSIS — R7881 Bacteremia: Secondary | ICD-10-CM | POA: Diagnosis not present

## 2023-03-19 DIAGNOSIS — Z992 Dependence on renal dialysis: Secondary | ICD-10-CM | POA: Diagnosis not present

## 2023-03-19 DIAGNOSIS — D689 Coagulation defect, unspecified: Secondary | ICD-10-CM | POA: Diagnosis not present

## 2023-03-19 DIAGNOSIS — N186 End stage renal disease: Secondary | ICD-10-CM | POA: Diagnosis not present

## 2023-03-19 DIAGNOSIS — N2581 Secondary hyperparathyroidism of renal origin: Secondary | ICD-10-CM | POA: Diagnosis not present

## 2023-03-19 DIAGNOSIS — D509 Iron deficiency anemia, unspecified: Secondary | ICD-10-CM | POA: Diagnosis not present

## 2023-03-19 MED ORDER — AURYXIA 1 GM 210 MG(FE) PO TABS
420.0000 mg | ORAL_TABLET | Freq: Every day | ORAL | 11 refills | Status: DC
  Filled 2023-03-19: qty 300, 30d supply, fill #0

## 2023-03-19 MED ORDER — CARVEDILOL 25 MG PO TABS: 25.0000 mg | ORAL_TABLET | Freq: Two times a day (BID) | ORAL | 3 refills | Status: DC

## 2023-03-31 DIAGNOSIS — Z992 Dependence on renal dialysis: Secondary | ICD-10-CM | POA: Diagnosis not present

## 2023-03-31 DIAGNOSIS — R7881 Bacteremia: Secondary | ICD-10-CM | POA: Diagnosis not present

## 2023-03-31 DIAGNOSIS — N2581 Secondary hyperparathyroidism of renal origin: Secondary | ICD-10-CM | POA: Diagnosis not present

## 2023-03-31 DIAGNOSIS — N186 End stage renal disease: Secondary | ICD-10-CM | POA: Diagnosis not present

## 2023-03-31 DIAGNOSIS — D689 Coagulation defect, unspecified: Secondary | ICD-10-CM | POA: Diagnosis not present

## 2023-03-31 DIAGNOSIS — D509 Iron deficiency anemia, unspecified: Secondary | ICD-10-CM | POA: Diagnosis not present

## 2023-04-02 DIAGNOSIS — Z992 Dependence on renal dialysis: Secondary | ICD-10-CM | POA: Diagnosis not present

## 2023-04-02 DIAGNOSIS — D509 Iron deficiency anemia, unspecified: Secondary | ICD-10-CM | POA: Diagnosis not present

## 2023-04-02 DIAGNOSIS — D689 Coagulation defect, unspecified: Secondary | ICD-10-CM | POA: Diagnosis not present

## 2023-04-02 DIAGNOSIS — N186 End stage renal disease: Secondary | ICD-10-CM | POA: Diagnosis not present

## 2023-04-02 DIAGNOSIS — R7881 Bacteremia: Secondary | ICD-10-CM | POA: Diagnosis not present

## 2023-04-02 DIAGNOSIS — N2581 Secondary hyperparathyroidism of renal origin: Secondary | ICD-10-CM | POA: Diagnosis not present

## 2023-04-03 ENCOUNTER — Other Ambulatory Visit (HOSPITAL_COMMUNITY): Payer: Self-pay

## 2023-04-03 ENCOUNTER — Other Ambulatory Visit: Payer: Self-pay | Admitting: Student

## 2023-04-03 DIAGNOSIS — E1121 Type 2 diabetes mellitus with diabetic nephropathy: Secondary | ICD-10-CM

## 2023-04-04 DIAGNOSIS — N2581 Secondary hyperparathyroidism of renal origin: Secondary | ICD-10-CM | POA: Diagnosis not present

## 2023-04-04 DIAGNOSIS — D689 Coagulation defect, unspecified: Secondary | ICD-10-CM | POA: Diagnosis not present

## 2023-04-04 DIAGNOSIS — R7881 Bacteremia: Secondary | ICD-10-CM | POA: Diagnosis not present

## 2023-04-04 DIAGNOSIS — N186 End stage renal disease: Secondary | ICD-10-CM | POA: Diagnosis not present

## 2023-04-04 DIAGNOSIS — D509 Iron deficiency anemia, unspecified: Secondary | ICD-10-CM | POA: Diagnosis not present

## 2023-04-04 DIAGNOSIS — Z992 Dependence on renal dialysis: Secondary | ICD-10-CM | POA: Diagnosis not present

## 2023-04-06 ENCOUNTER — Other Ambulatory Visit (HOSPITAL_COMMUNITY): Payer: Self-pay

## 2023-04-06 MED ORDER — GLIPIZIDE 5 MG PO TABS
5.0000 mg | ORAL_TABLET | Freq: Every day | ORAL | 1 refills | Status: DC
Start: 2023-04-06 — End: 2023-09-06
  Filled 2023-04-06: qty 90, 90d supply, fill #0
  Filled 2023-07-07: qty 90, 90d supply, fill #1

## 2023-04-07 DIAGNOSIS — D689 Coagulation defect, unspecified: Secondary | ICD-10-CM | POA: Diagnosis not present

## 2023-04-07 DIAGNOSIS — N186 End stage renal disease: Secondary | ICD-10-CM | POA: Diagnosis not present

## 2023-04-07 DIAGNOSIS — Z992 Dependence on renal dialysis: Secondary | ICD-10-CM | POA: Diagnosis not present

## 2023-04-07 DIAGNOSIS — R7881 Bacteremia: Secondary | ICD-10-CM | POA: Diagnosis not present

## 2023-04-07 DIAGNOSIS — N2581 Secondary hyperparathyroidism of renal origin: Secondary | ICD-10-CM | POA: Diagnosis not present

## 2023-04-08 ENCOUNTER — Other Ambulatory Visit (HOSPITAL_COMMUNITY): Payer: Self-pay

## 2023-04-08 DIAGNOSIS — N186 End stage renal disease: Secondary | ICD-10-CM | POA: Diagnosis not present

## 2023-04-08 DIAGNOSIS — E1129 Type 2 diabetes mellitus with other diabetic kidney complication: Secondary | ICD-10-CM | POA: Diagnosis not present

## 2023-04-08 DIAGNOSIS — Z992 Dependence on renal dialysis: Secondary | ICD-10-CM | POA: Diagnosis not present

## 2023-04-10 ENCOUNTER — Other Ambulatory Visit (HOSPITAL_COMMUNITY): Payer: Self-pay

## 2023-04-10 DIAGNOSIS — N2581 Secondary hyperparathyroidism of renal origin: Secondary | ICD-10-CM | POA: Diagnosis not present

## 2023-04-10 DIAGNOSIS — D689 Coagulation defect, unspecified: Secondary | ICD-10-CM | POA: Diagnosis not present

## 2023-04-10 DIAGNOSIS — N186 End stage renal disease: Secondary | ICD-10-CM | POA: Diagnosis not present

## 2023-04-10 DIAGNOSIS — D509 Iron deficiency anemia, unspecified: Secondary | ICD-10-CM | POA: Diagnosis not present

## 2023-04-10 DIAGNOSIS — R7881 Bacteremia: Secondary | ICD-10-CM | POA: Diagnosis not present

## 2023-04-10 DIAGNOSIS — Z992 Dependence on renal dialysis: Secondary | ICD-10-CM | POA: Diagnosis not present

## 2023-04-10 MED ORDER — CLONIDINE HCL 0.3 MG PO TABS
0.3000 mg | ORAL_TABLET | Freq: Two times a day (BID) | ORAL | 4 refills | Status: DC
  Filled 2023-04-10: qty 180, 90d supply, fill #0
  Filled 2023-07-30: qty 180, 90d supply, fill #1

## 2023-04-14 DIAGNOSIS — R7881 Bacteremia: Secondary | ICD-10-CM | POA: Diagnosis not present

## 2023-04-14 DIAGNOSIS — Z992 Dependence on renal dialysis: Secondary | ICD-10-CM | POA: Diagnosis not present

## 2023-04-14 DIAGNOSIS — D689 Coagulation defect, unspecified: Secondary | ICD-10-CM | POA: Diagnosis not present

## 2023-04-14 DIAGNOSIS — N186 End stage renal disease: Secondary | ICD-10-CM | POA: Diagnosis not present

## 2023-04-14 DIAGNOSIS — N2581 Secondary hyperparathyroidism of renal origin: Secondary | ICD-10-CM | POA: Diagnosis not present

## 2023-04-15 ENCOUNTER — Other Ambulatory Visit (HOSPITAL_COMMUNITY): Payer: Self-pay

## 2023-04-17 ENCOUNTER — Other Ambulatory Visit (HOSPITAL_COMMUNITY): Payer: Self-pay

## 2023-04-18 DIAGNOSIS — R7881 Bacteremia: Secondary | ICD-10-CM | POA: Diagnosis not present

## 2023-04-18 DIAGNOSIS — Z992 Dependence on renal dialysis: Secondary | ICD-10-CM | POA: Diagnosis not present

## 2023-04-18 DIAGNOSIS — D689 Coagulation defect, unspecified: Secondary | ICD-10-CM | POA: Diagnosis not present

## 2023-04-18 DIAGNOSIS — N2581 Secondary hyperparathyroidism of renal origin: Secondary | ICD-10-CM | POA: Diagnosis not present

## 2023-04-18 DIAGNOSIS — N186 End stage renal disease: Secondary | ICD-10-CM | POA: Diagnosis not present

## 2023-04-21 DIAGNOSIS — R7881 Bacteremia: Secondary | ICD-10-CM | POA: Diagnosis not present

## 2023-04-21 DIAGNOSIS — N2581 Secondary hyperparathyroidism of renal origin: Secondary | ICD-10-CM | POA: Diagnosis not present

## 2023-04-21 DIAGNOSIS — Z992 Dependence on renal dialysis: Secondary | ICD-10-CM | POA: Diagnosis not present

## 2023-04-21 DIAGNOSIS — D509 Iron deficiency anemia, unspecified: Secondary | ICD-10-CM | POA: Diagnosis not present

## 2023-04-21 DIAGNOSIS — D689 Coagulation defect, unspecified: Secondary | ICD-10-CM | POA: Diagnosis not present

## 2023-04-21 DIAGNOSIS — N186 End stage renal disease: Secondary | ICD-10-CM | POA: Diagnosis not present

## 2023-04-23 DIAGNOSIS — D689 Coagulation defect, unspecified: Secondary | ICD-10-CM | POA: Diagnosis not present

## 2023-04-23 DIAGNOSIS — R7881 Bacteremia: Secondary | ICD-10-CM | POA: Diagnosis not present

## 2023-04-23 DIAGNOSIS — N2581 Secondary hyperparathyroidism of renal origin: Secondary | ICD-10-CM | POA: Diagnosis not present

## 2023-04-23 DIAGNOSIS — Z992 Dependence on renal dialysis: Secondary | ICD-10-CM | POA: Diagnosis not present

## 2023-04-23 DIAGNOSIS — D509 Iron deficiency anemia, unspecified: Secondary | ICD-10-CM | POA: Diagnosis not present

## 2023-04-23 DIAGNOSIS — N186 End stage renal disease: Secondary | ICD-10-CM | POA: Diagnosis not present

## 2023-04-25 DIAGNOSIS — R7881 Bacteremia: Secondary | ICD-10-CM | POA: Diagnosis not present

## 2023-04-25 DIAGNOSIS — N2581 Secondary hyperparathyroidism of renal origin: Secondary | ICD-10-CM | POA: Diagnosis not present

## 2023-04-25 DIAGNOSIS — N186 End stage renal disease: Secondary | ICD-10-CM | POA: Diagnosis not present

## 2023-04-25 DIAGNOSIS — Z992 Dependence on renal dialysis: Secondary | ICD-10-CM | POA: Diagnosis not present

## 2023-04-25 DIAGNOSIS — D509 Iron deficiency anemia, unspecified: Secondary | ICD-10-CM | POA: Diagnosis not present

## 2023-04-25 DIAGNOSIS — D689 Coagulation defect, unspecified: Secondary | ICD-10-CM | POA: Diagnosis not present

## 2023-04-28 ENCOUNTER — Other Ambulatory Visit (HOSPITAL_COMMUNITY): Payer: Self-pay

## 2023-04-28 DIAGNOSIS — N2589 Other disorders resulting from impaired renal tubular function: Secondary | ICD-10-CM | POA: Diagnosis not present

## 2023-04-28 DIAGNOSIS — R7881 Bacteremia: Secondary | ICD-10-CM | POA: Diagnosis not present

## 2023-04-28 DIAGNOSIS — D689 Coagulation defect, unspecified: Secondary | ICD-10-CM | POA: Diagnosis not present

## 2023-04-28 DIAGNOSIS — E1129 Type 2 diabetes mellitus with other diabetic kidney complication: Secondary | ICD-10-CM | POA: Diagnosis not present

## 2023-04-28 DIAGNOSIS — K7689 Other specified diseases of liver: Secondary | ICD-10-CM | POA: Diagnosis not present

## 2023-04-28 DIAGNOSIS — Z992 Dependence on renal dialysis: Secondary | ICD-10-CM | POA: Diagnosis not present

## 2023-04-28 DIAGNOSIS — D509 Iron deficiency anemia, unspecified: Secondary | ICD-10-CM | POA: Diagnosis not present

## 2023-04-28 DIAGNOSIS — E7849 Other hyperlipidemia: Secondary | ICD-10-CM | POA: Diagnosis not present

## 2023-04-28 DIAGNOSIS — N186 End stage renal disease: Secondary | ICD-10-CM | POA: Diagnosis not present

## 2023-04-28 DIAGNOSIS — D631 Anemia in chronic kidney disease: Secondary | ICD-10-CM | POA: Diagnosis not present

## 2023-04-28 DIAGNOSIS — R82998 Other abnormal findings in urine: Secondary | ICD-10-CM | POA: Diagnosis not present

## 2023-04-28 DIAGNOSIS — N2581 Secondary hyperparathyroidism of renal origin: Secondary | ICD-10-CM | POA: Diagnosis not present

## 2023-04-28 MED ORDER — INSULIN ASPART 100 UNIT/ML FLEXPEN
15.0000 [IU] | PEN_INJECTOR | Freq: Three times a day (TID) | SUBCUTANEOUS | 0 refills | Status: DC
  Filled 2023-04-28: qty 9, 21d supply, fill #0
  Filled 2023-04-28: qty 10, 22d supply, fill #0

## 2023-04-28 MED ORDER — HYDRALAZINE HCL 50 MG PO TABS
50.0000 mg | ORAL_TABLET | Freq: Three times a day (TID) | ORAL | 6 refills | Status: DC
  Filled 2023-04-28: qty 90, 30d supply, fill #0

## 2023-04-28 MED ORDER — DOXYCYCLINE HYCLATE 100 MG PO TABS
100.0000 mg | ORAL_TABLET | Freq: Two times a day (BID) | ORAL | 0 refills | Status: DC
  Filled 2023-04-28: qty 14, 7d supply, fill #0

## 2023-05-02 DIAGNOSIS — N2589 Other disorders resulting from impaired renal tubular function: Secondary | ICD-10-CM | POA: Diagnosis not present

## 2023-05-02 DIAGNOSIS — K7689 Other specified diseases of liver: Secondary | ICD-10-CM | POA: Diagnosis not present

## 2023-05-02 DIAGNOSIS — R7881 Bacteremia: Secondary | ICD-10-CM | POA: Diagnosis not present

## 2023-05-02 DIAGNOSIS — E7849 Other hyperlipidemia: Secondary | ICD-10-CM | POA: Diagnosis not present

## 2023-05-02 DIAGNOSIS — N2581 Secondary hyperparathyroidism of renal origin: Secondary | ICD-10-CM | POA: Diagnosis not present

## 2023-05-02 DIAGNOSIS — D631 Anemia in chronic kidney disease: Secondary | ICD-10-CM | POA: Diagnosis not present

## 2023-05-02 DIAGNOSIS — R82998 Other abnormal findings in urine: Secondary | ICD-10-CM | POA: Diagnosis not present

## 2023-05-02 DIAGNOSIS — Z992 Dependence on renal dialysis: Secondary | ICD-10-CM | POA: Diagnosis not present

## 2023-05-02 DIAGNOSIS — E1129 Type 2 diabetes mellitus with other diabetic kidney complication: Secondary | ICD-10-CM | POA: Diagnosis not present

## 2023-05-02 DIAGNOSIS — N186 End stage renal disease: Secondary | ICD-10-CM | POA: Diagnosis not present

## 2023-05-02 DIAGNOSIS — D689 Coagulation defect, unspecified: Secondary | ICD-10-CM | POA: Diagnosis not present

## 2023-05-02 DIAGNOSIS — D509 Iron deficiency anemia, unspecified: Secondary | ICD-10-CM | POA: Diagnosis not present

## 2023-05-05 DIAGNOSIS — N186 End stage renal disease: Secondary | ICD-10-CM | POA: Diagnosis not present

## 2023-05-05 DIAGNOSIS — N2581 Secondary hyperparathyroidism of renal origin: Secondary | ICD-10-CM | POA: Diagnosis not present

## 2023-05-05 DIAGNOSIS — D509 Iron deficiency anemia, unspecified: Secondary | ICD-10-CM | POA: Diagnosis not present

## 2023-05-05 DIAGNOSIS — D689 Coagulation defect, unspecified: Secondary | ICD-10-CM | POA: Diagnosis not present

## 2023-05-05 DIAGNOSIS — D631 Anemia in chronic kidney disease: Secondary | ICD-10-CM | POA: Diagnosis not present

## 2023-05-05 DIAGNOSIS — R7881 Bacteremia: Secondary | ICD-10-CM | POA: Diagnosis not present

## 2023-05-05 DIAGNOSIS — Z992 Dependence on renal dialysis: Secondary | ICD-10-CM | POA: Diagnosis not present

## 2023-05-07 DIAGNOSIS — R7881 Bacteremia: Secondary | ICD-10-CM | POA: Diagnosis not present

## 2023-05-07 DIAGNOSIS — D631 Anemia in chronic kidney disease: Secondary | ICD-10-CM | POA: Diagnosis not present

## 2023-05-07 DIAGNOSIS — N186 End stage renal disease: Secondary | ICD-10-CM | POA: Diagnosis not present

## 2023-05-07 DIAGNOSIS — D509 Iron deficiency anemia, unspecified: Secondary | ICD-10-CM | POA: Diagnosis not present

## 2023-05-07 DIAGNOSIS — D689 Coagulation defect, unspecified: Secondary | ICD-10-CM | POA: Diagnosis not present

## 2023-05-07 DIAGNOSIS — N2581 Secondary hyperparathyroidism of renal origin: Secondary | ICD-10-CM | POA: Diagnosis not present

## 2023-05-07 DIAGNOSIS — Z992 Dependence on renal dialysis: Secondary | ICD-10-CM | POA: Diagnosis not present

## 2023-05-09 DIAGNOSIS — N186 End stage renal disease: Secondary | ICD-10-CM | POA: Diagnosis not present

## 2023-05-09 DIAGNOSIS — Z992 Dependence on renal dialysis: Secondary | ICD-10-CM | POA: Diagnosis not present

## 2023-05-09 DIAGNOSIS — E1129 Type 2 diabetes mellitus with other diabetic kidney complication: Secondary | ICD-10-CM | POA: Diagnosis not present

## 2023-05-12 DIAGNOSIS — D509 Iron deficiency anemia, unspecified: Secondary | ICD-10-CM | POA: Diagnosis not present

## 2023-05-12 DIAGNOSIS — N2581 Secondary hyperparathyroidism of renal origin: Secondary | ICD-10-CM | POA: Diagnosis not present

## 2023-05-12 DIAGNOSIS — R7881 Bacteremia: Secondary | ICD-10-CM | POA: Diagnosis not present

## 2023-05-12 DIAGNOSIS — D631 Anemia in chronic kidney disease: Secondary | ICD-10-CM | POA: Diagnosis not present

## 2023-05-12 DIAGNOSIS — N186 End stage renal disease: Secondary | ICD-10-CM | POA: Diagnosis not present

## 2023-05-12 DIAGNOSIS — Z992 Dependence on renal dialysis: Secondary | ICD-10-CM | POA: Diagnosis not present

## 2023-05-12 DIAGNOSIS — D689 Coagulation defect, unspecified: Secondary | ICD-10-CM | POA: Diagnosis not present

## 2023-05-14 DIAGNOSIS — D509 Iron deficiency anemia, unspecified: Secondary | ICD-10-CM | POA: Diagnosis not present

## 2023-05-14 DIAGNOSIS — R7881 Bacteremia: Secondary | ICD-10-CM | POA: Diagnosis not present

## 2023-05-14 DIAGNOSIS — N186 End stage renal disease: Secondary | ICD-10-CM | POA: Diagnosis not present

## 2023-05-14 DIAGNOSIS — D689 Coagulation defect, unspecified: Secondary | ICD-10-CM | POA: Diagnosis not present

## 2023-05-14 DIAGNOSIS — N2581 Secondary hyperparathyroidism of renal origin: Secondary | ICD-10-CM | POA: Diagnosis not present

## 2023-05-14 DIAGNOSIS — Z992 Dependence on renal dialysis: Secondary | ICD-10-CM | POA: Diagnosis not present

## 2023-05-14 DIAGNOSIS — D631 Anemia in chronic kidney disease: Secondary | ICD-10-CM | POA: Diagnosis not present

## 2023-05-16 DIAGNOSIS — Z992 Dependence on renal dialysis: Secondary | ICD-10-CM | POA: Diagnosis not present

## 2023-05-16 DIAGNOSIS — N186 End stage renal disease: Secondary | ICD-10-CM | POA: Diagnosis not present

## 2023-05-16 DIAGNOSIS — D631 Anemia in chronic kidney disease: Secondary | ICD-10-CM | POA: Diagnosis not present

## 2023-05-16 DIAGNOSIS — N2581 Secondary hyperparathyroidism of renal origin: Secondary | ICD-10-CM | POA: Diagnosis not present

## 2023-05-16 DIAGNOSIS — D509 Iron deficiency anemia, unspecified: Secondary | ICD-10-CM | POA: Diagnosis not present

## 2023-05-16 DIAGNOSIS — D689 Coagulation defect, unspecified: Secondary | ICD-10-CM | POA: Diagnosis not present

## 2023-05-16 DIAGNOSIS — R7881 Bacteremia: Secondary | ICD-10-CM | POA: Diagnosis not present

## 2023-05-19 ENCOUNTER — Other Ambulatory Visit (HOSPITAL_COMMUNITY): Payer: Self-pay

## 2023-05-19 DIAGNOSIS — N2581 Secondary hyperparathyroidism of renal origin: Secondary | ICD-10-CM | POA: Diagnosis not present

## 2023-05-19 DIAGNOSIS — D689 Coagulation defect, unspecified: Secondary | ICD-10-CM | POA: Diagnosis not present

## 2023-05-19 DIAGNOSIS — R7881 Bacteremia: Secondary | ICD-10-CM | POA: Diagnosis not present

## 2023-05-19 DIAGNOSIS — D509 Iron deficiency anemia, unspecified: Secondary | ICD-10-CM | POA: Diagnosis not present

## 2023-05-19 DIAGNOSIS — N186 End stage renal disease: Secondary | ICD-10-CM | POA: Diagnosis not present

## 2023-05-19 DIAGNOSIS — Z992 Dependence on renal dialysis: Secondary | ICD-10-CM | POA: Diagnosis not present

## 2023-05-19 DIAGNOSIS — D631 Anemia in chronic kidney disease: Secondary | ICD-10-CM | POA: Diagnosis not present

## 2023-05-19 MED ORDER — CARVEDILOL 25 MG PO TABS
25.0000 mg | ORAL_TABLET | Freq: Two times a day (BID) | ORAL | 3 refills | Status: DC
  Filled 2023-05-19: qty 180, 90d supply, fill #0
  Filled 2023-07-03 – 2023-07-30 (×2): qty 180, 90d supply, fill #1

## 2023-05-23 DIAGNOSIS — R7881 Bacteremia: Secondary | ICD-10-CM | POA: Diagnosis not present

## 2023-05-23 DIAGNOSIS — N186 End stage renal disease: Secondary | ICD-10-CM | POA: Diagnosis not present

## 2023-05-23 DIAGNOSIS — N2581 Secondary hyperparathyroidism of renal origin: Secondary | ICD-10-CM | POA: Diagnosis not present

## 2023-05-23 DIAGNOSIS — Z992 Dependence on renal dialysis: Secondary | ICD-10-CM | POA: Diagnosis not present

## 2023-05-23 DIAGNOSIS — D509 Iron deficiency anemia, unspecified: Secondary | ICD-10-CM | POA: Diagnosis not present

## 2023-05-23 DIAGNOSIS — D689 Coagulation defect, unspecified: Secondary | ICD-10-CM | POA: Diagnosis not present

## 2023-05-23 DIAGNOSIS — D631 Anemia in chronic kidney disease: Secondary | ICD-10-CM | POA: Diagnosis not present

## 2023-05-26 DIAGNOSIS — N2589 Other disorders resulting from impaired renal tubular function: Secondary | ICD-10-CM | POA: Diagnosis not present

## 2023-05-26 DIAGNOSIS — Z992 Dependence on renal dialysis: Secondary | ICD-10-CM | POA: Diagnosis not present

## 2023-05-26 DIAGNOSIS — R17 Unspecified jaundice: Secondary | ICD-10-CM | POA: Diagnosis not present

## 2023-05-26 DIAGNOSIS — N186 End stage renal disease: Secondary | ICD-10-CM | POA: Diagnosis not present

## 2023-05-26 DIAGNOSIS — E1129 Type 2 diabetes mellitus with other diabetic kidney complication: Secondary | ICD-10-CM | POA: Diagnosis not present

## 2023-05-26 DIAGNOSIS — N2581 Secondary hyperparathyroidism of renal origin: Secondary | ICD-10-CM | POA: Diagnosis not present

## 2023-05-26 DIAGNOSIS — R7881 Bacteremia: Secondary | ICD-10-CM | POA: Diagnosis not present

## 2023-05-26 DIAGNOSIS — E878 Other disorders of electrolyte and fluid balance, not elsewhere classified: Secondary | ICD-10-CM | POA: Diagnosis not present

## 2023-05-26 DIAGNOSIS — E440 Moderate protein-calorie malnutrition: Secondary | ICD-10-CM | POA: Diagnosis not present

## 2023-05-26 DIAGNOSIS — D509 Iron deficiency anemia, unspecified: Secondary | ICD-10-CM | POA: Diagnosis not present

## 2023-05-26 DIAGNOSIS — R52 Pain, unspecified: Secondary | ICD-10-CM | POA: Diagnosis not present

## 2023-05-26 DIAGNOSIS — D631 Anemia in chronic kidney disease: Secondary | ICD-10-CM | POA: Diagnosis not present

## 2023-05-26 DIAGNOSIS — Z79899 Other long term (current) drug therapy: Secondary | ICD-10-CM | POA: Diagnosis not present

## 2023-05-26 DIAGNOSIS — D689 Coagulation defect, unspecified: Secondary | ICD-10-CM | POA: Diagnosis not present

## 2023-05-28 DIAGNOSIS — R52 Pain, unspecified: Secondary | ICD-10-CM | POA: Diagnosis not present

## 2023-05-28 DIAGNOSIS — D689 Coagulation defect, unspecified: Secondary | ICD-10-CM | POA: Diagnosis not present

## 2023-05-28 DIAGNOSIS — N2589 Other disorders resulting from impaired renal tubular function: Secondary | ICD-10-CM | POA: Diagnosis not present

## 2023-05-28 DIAGNOSIS — Z79899 Other long term (current) drug therapy: Secondary | ICD-10-CM | POA: Diagnosis not present

## 2023-05-28 DIAGNOSIS — Z992 Dependence on renal dialysis: Secondary | ICD-10-CM | POA: Diagnosis not present

## 2023-05-28 DIAGNOSIS — N186 End stage renal disease: Secondary | ICD-10-CM | POA: Diagnosis not present

## 2023-05-28 DIAGNOSIS — N2581 Secondary hyperparathyroidism of renal origin: Secondary | ICD-10-CM | POA: Diagnosis not present

## 2023-05-28 DIAGNOSIS — R17 Unspecified jaundice: Secondary | ICD-10-CM | POA: Diagnosis not present

## 2023-05-28 DIAGNOSIS — R7881 Bacteremia: Secondary | ICD-10-CM | POA: Diagnosis not present

## 2023-05-28 DIAGNOSIS — E1129 Type 2 diabetes mellitus with other diabetic kidney complication: Secondary | ICD-10-CM | POA: Diagnosis not present

## 2023-05-28 DIAGNOSIS — E878 Other disorders of electrolyte and fluid balance, not elsewhere classified: Secondary | ICD-10-CM | POA: Diagnosis not present

## 2023-05-28 DIAGNOSIS — D631 Anemia in chronic kidney disease: Secondary | ICD-10-CM | POA: Diagnosis not present

## 2023-05-28 DIAGNOSIS — E440 Moderate protein-calorie malnutrition: Secondary | ICD-10-CM | POA: Diagnosis not present

## 2023-05-28 DIAGNOSIS — D509 Iron deficiency anemia, unspecified: Secondary | ICD-10-CM | POA: Diagnosis not present

## 2023-05-30 DIAGNOSIS — Z992 Dependence on renal dialysis: Secondary | ICD-10-CM | POA: Diagnosis not present

## 2023-05-30 DIAGNOSIS — N2589 Other disorders resulting from impaired renal tubular function: Secondary | ICD-10-CM | POA: Diagnosis not present

## 2023-05-30 DIAGNOSIS — R52 Pain, unspecified: Secondary | ICD-10-CM | POA: Diagnosis not present

## 2023-05-30 DIAGNOSIS — E440 Moderate protein-calorie malnutrition: Secondary | ICD-10-CM | POA: Diagnosis not present

## 2023-05-30 DIAGNOSIS — D509 Iron deficiency anemia, unspecified: Secondary | ICD-10-CM | POA: Diagnosis not present

## 2023-05-30 DIAGNOSIS — N186 End stage renal disease: Secondary | ICD-10-CM | POA: Diagnosis not present

## 2023-05-30 DIAGNOSIS — D689 Coagulation defect, unspecified: Secondary | ICD-10-CM | POA: Diagnosis not present

## 2023-05-30 DIAGNOSIS — R7881 Bacteremia: Secondary | ICD-10-CM | POA: Diagnosis not present

## 2023-05-30 DIAGNOSIS — R17 Unspecified jaundice: Secondary | ICD-10-CM | POA: Diagnosis not present

## 2023-05-30 DIAGNOSIS — Z79899 Other long term (current) drug therapy: Secondary | ICD-10-CM | POA: Diagnosis not present

## 2023-05-30 DIAGNOSIS — E878 Other disorders of electrolyte and fluid balance, not elsewhere classified: Secondary | ICD-10-CM | POA: Diagnosis not present

## 2023-05-30 DIAGNOSIS — E1129 Type 2 diabetes mellitus with other diabetic kidney complication: Secondary | ICD-10-CM | POA: Diagnosis not present

## 2023-05-30 DIAGNOSIS — N2581 Secondary hyperparathyroidism of renal origin: Secondary | ICD-10-CM | POA: Diagnosis not present

## 2023-05-30 DIAGNOSIS — D631 Anemia in chronic kidney disease: Secondary | ICD-10-CM | POA: Diagnosis not present

## 2023-06-02 DIAGNOSIS — D509 Iron deficiency anemia, unspecified: Secondary | ICD-10-CM | POA: Diagnosis not present

## 2023-06-02 DIAGNOSIS — N186 End stage renal disease: Secondary | ICD-10-CM | POA: Diagnosis not present

## 2023-06-02 DIAGNOSIS — D689 Coagulation defect, unspecified: Secondary | ICD-10-CM | POA: Diagnosis not present

## 2023-06-02 DIAGNOSIS — N2581 Secondary hyperparathyroidism of renal origin: Secondary | ICD-10-CM | POA: Diagnosis not present

## 2023-06-02 DIAGNOSIS — Z992 Dependence on renal dialysis: Secondary | ICD-10-CM | POA: Diagnosis not present

## 2023-06-02 DIAGNOSIS — R7881 Bacteremia: Secondary | ICD-10-CM | POA: Diagnosis not present

## 2023-06-04 ENCOUNTER — Other Ambulatory Visit (HOSPITAL_COMMUNITY): Payer: Self-pay

## 2023-06-04 DIAGNOSIS — D509 Iron deficiency anemia, unspecified: Secondary | ICD-10-CM | POA: Diagnosis not present

## 2023-06-04 DIAGNOSIS — N2581 Secondary hyperparathyroidism of renal origin: Secondary | ICD-10-CM | POA: Diagnosis not present

## 2023-06-04 DIAGNOSIS — Z992 Dependence on renal dialysis: Secondary | ICD-10-CM | POA: Diagnosis not present

## 2023-06-04 DIAGNOSIS — R7881 Bacteremia: Secondary | ICD-10-CM | POA: Diagnosis not present

## 2023-06-04 DIAGNOSIS — D689 Coagulation defect, unspecified: Secondary | ICD-10-CM | POA: Diagnosis not present

## 2023-06-04 DIAGNOSIS — N186 End stage renal disease: Secondary | ICD-10-CM | POA: Diagnosis not present

## 2023-06-04 MED ORDER — DOXYCYCLINE HYCLATE 100 MG PO TABS
100.0000 mg | ORAL_TABLET | Freq: Two times a day (BID) | ORAL | 0 refills | Status: DC
  Filled 2023-06-04: qty 14, 7d supply, fill #0

## 2023-06-04 MED ORDER — ONDANSETRON HCL 4 MG PO TABS
4.0000 mg | ORAL_TABLET | Freq: Every day | ORAL | 11 refills | Status: DC | PRN
  Filled 2023-06-04: qty 30, 30d supply, fill #0

## 2023-06-06 DIAGNOSIS — D509 Iron deficiency anemia, unspecified: Secondary | ICD-10-CM | POA: Diagnosis not present

## 2023-06-06 DIAGNOSIS — R7881 Bacteremia: Secondary | ICD-10-CM | POA: Diagnosis not present

## 2023-06-06 DIAGNOSIS — N186 End stage renal disease: Secondary | ICD-10-CM | POA: Diagnosis not present

## 2023-06-06 DIAGNOSIS — Z992 Dependence on renal dialysis: Secondary | ICD-10-CM | POA: Diagnosis not present

## 2023-06-06 DIAGNOSIS — N2581 Secondary hyperparathyroidism of renal origin: Secondary | ICD-10-CM | POA: Diagnosis not present

## 2023-06-06 DIAGNOSIS — D689 Coagulation defect, unspecified: Secondary | ICD-10-CM | POA: Diagnosis not present

## 2023-06-08 DIAGNOSIS — E1129 Type 2 diabetes mellitus with other diabetic kidney complication: Secondary | ICD-10-CM | POA: Diagnosis not present

## 2023-06-08 DIAGNOSIS — Z992 Dependence on renal dialysis: Secondary | ICD-10-CM | POA: Diagnosis not present

## 2023-06-08 DIAGNOSIS — N186 End stage renal disease: Secondary | ICD-10-CM | POA: Diagnosis not present

## 2023-06-09 DIAGNOSIS — N186 End stage renal disease: Secondary | ICD-10-CM | POA: Diagnosis not present

## 2023-06-09 DIAGNOSIS — D689 Coagulation defect, unspecified: Secondary | ICD-10-CM | POA: Diagnosis not present

## 2023-06-09 DIAGNOSIS — D509 Iron deficiency anemia, unspecified: Secondary | ICD-10-CM | POA: Diagnosis not present

## 2023-06-09 DIAGNOSIS — R7881 Bacteremia: Secondary | ICD-10-CM | POA: Diagnosis not present

## 2023-06-09 DIAGNOSIS — N2581 Secondary hyperparathyroidism of renal origin: Secondary | ICD-10-CM | POA: Diagnosis not present

## 2023-06-09 DIAGNOSIS — Z992 Dependence on renal dialysis: Secondary | ICD-10-CM | POA: Diagnosis not present

## 2023-06-11 DIAGNOSIS — D689 Coagulation defect, unspecified: Secondary | ICD-10-CM | POA: Diagnosis not present

## 2023-06-11 DIAGNOSIS — D509 Iron deficiency anemia, unspecified: Secondary | ICD-10-CM | POA: Diagnosis not present

## 2023-06-11 DIAGNOSIS — N2581 Secondary hyperparathyroidism of renal origin: Secondary | ICD-10-CM | POA: Diagnosis not present

## 2023-06-11 DIAGNOSIS — Z992 Dependence on renal dialysis: Secondary | ICD-10-CM | POA: Diagnosis not present

## 2023-06-11 DIAGNOSIS — R7881 Bacteremia: Secondary | ICD-10-CM | POA: Diagnosis not present

## 2023-06-11 DIAGNOSIS — N186 End stage renal disease: Secondary | ICD-10-CM | POA: Diagnosis not present

## 2023-06-13 DIAGNOSIS — D509 Iron deficiency anemia, unspecified: Secondary | ICD-10-CM | POA: Diagnosis not present

## 2023-06-13 DIAGNOSIS — N2581 Secondary hyperparathyroidism of renal origin: Secondary | ICD-10-CM | POA: Diagnosis not present

## 2023-06-13 DIAGNOSIS — Z992 Dependence on renal dialysis: Secondary | ICD-10-CM | POA: Diagnosis not present

## 2023-06-13 DIAGNOSIS — D689 Coagulation defect, unspecified: Secondary | ICD-10-CM | POA: Diagnosis not present

## 2023-06-13 DIAGNOSIS — N186 End stage renal disease: Secondary | ICD-10-CM | POA: Diagnosis not present

## 2023-06-13 DIAGNOSIS — R7881 Bacteremia: Secondary | ICD-10-CM | POA: Diagnosis not present

## 2023-06-16 ENCOUNTER — Other Ambulatory Visit (HOSPITAL_COMMUNITY): Payer: Self-pay

## 2023-06-16 DIAGNOSIS — D509 Iron deficiency anemia, unspecified: Secondary | ICD-10-CM | POA: Diagnosis not present

## 2023-06-16 DIAGNOSIS — D689 Coagulation defect, unspecified: Secondary | ICD-10-CM | POA: Diagnosis not present

## 2023-06-16 DIAGNOSIS — R52 Pain, unspecified: Secondary | ICD-10-CM | POA: Diagnosis not present

## 2023-06-16 DIAGNOSIS — R7881 Bacteremia: Secondary | ICD-10-CM | POA: Diagnosis not present

## 2023-06-16 DIAGNOSIS — Z992 Dependence on renal dialysis: Secondary | ICD-10-CM | POA: Diagnosis not present

## 2023-06-16 DIAGNOSIS — N186 End stage renal disease: Secondary | ICD-10-CM | POA: Diagnosis not present

## 2023-06-16 DIAGNOSIS — N2581 Secondary hyperparathyroidism of renal origin: Secondary | ICD-10-CM | POA: Diagnosis not present

## 2023-06-16 MED ORDER — DOXYCYCLINE HYCLATE 100 MG PO TABS
100.0000 mg | ORAL_TABLET | Freq: Two times a day (BID) | ORAL | 0 refills | Status: AC
  Filled 2023-06-16: qty 10, 5d supply, fill #0

## 2023-06-16 MED ORDER — HYDRALAZINE HCL 50 MG PO TABS
50.0000 mg | ORAL_TABLET | Freq: Three times a day (TID) | ORAL | 3 refills | Status: DC
  Filled 2023-06-16: qty 270, 90d supply, fill #0

## 2023-06-18 DIAGNOSIS — Z992 Dependence on renal dialysis: Secondary | ICD-10-CM | POA: Diagnosis not present

## 2023-06-18 DIAGNOSIS — N2581 Secondary hyperparathyroidism of renal origin: Secondary | ICD-10-CM | POA: Diagnosis not present

## 2023-06-18 DIAGNOSIS — R7881 Bacteremia: Secondary | ICD-10-CM | POA: Diagnosis not present

## 2023-06-18 DIAGNOSIS — D509 Iron deficiency anemia, unspecified: Secondary | ICD-10-CM | POA: Diagnosis not present

## 2023-06-18 DIAGNOSIS — D689 Coagulation defect, unspecified: Secondary | ICD-10-CM | POA: Diagnosis not present

## 2023-06-18 DIAGNOSIS — R52 Pain, unspecified: Secondary | ICD-10-CM | POA: Diagnosis not present

## 2023-06-18 DIAGNOSIS — N186 End stage renal disease: Secondary | ICD-10-CM | POA: Diagnosis not present

## 2023-06-20 DIAGNOSIS — R52 Pain, unspecified: Secondary | ICD-10-CM | POA: Diagnosis not present

## 2023-06-20 DIAGNOSIS — D509 Iron deficiency anemia, unspecified: Secondary | ICD-10-CM | POA: Diagnosis not present

## 2023-06-20 DIAGNOSIS — Z992 Dependence on renal dialysis: Secondary | ICD-10-CM | POA: Diagnosis not present

## 2023-06-20 DIAGNOSIS — D689 Coagulation defect, unspecified: Secondary | ICD-10-CM | POA: Diagnosis not present

## 2023-06-20 DIAGNOSIS — R7881 Bacteremia: Secondary | ICD-10-CM | POA: Diagnosis not present

## 2023-06-20 DIAGNOSIS — N2581 Secondary hyperparathyroidism of renal origin: Secondary | ICD-10-CM | POA: Diagnosis not present

## 2023-06-20 DIAGNOSIS — N186 End stage renal disease: Secondary | ICD-10-CM | POA: Diagnosis not present

## 2023-06-23 DIAGNOSIS — R7881 Bacteremia: Secondary | ICD-10-CM | POA: Diagnosis not present

## 2023-06-23 DIAGNOSIS — D631 Anemia in chronic kidney disease: Secondary | ICD-10-CM | POA: Diagnosis not present

## 2023-06-23 DIAGNOSIS — D689 Coagulation defect, unspecified: Secondary | ICD-10-CM | POA: Diagnosis not present

## 2023-06-23 DIAGNOSIS — E1129 Type 2 diabetes mellitus with other diabetic kidney complication: Secondary | ICD-10-CM | POA: Diagnosis not present

## 2023-06-23 DIAGNOSIS — R17 Unspecified jaundice: Secondary | ICD-10-CM | POA: Diagnosis not present

## 2023-06-23 DIAGNOSIS — Z992 Dependence on renal dialysis: Secondary | ICD-10-CM | POA: Diagnosis not present

## 2023-06-23 DIAGNOSIS — N2581 Secondary hyperparathyroidism of renal origin: Secondary | ICD-10-CM | POA: Diagnosis not present

## 2023-06-23 DIAGNOSIS — N2589 Other disorders resulting from impaired renal tubular function: Secondary | ICD-10-CM | POA: Diagnosis not present

## 2023-06-23 DIAGNOSIS — D509 Iron deficiency anemia, unspecified: Secondary | ICD-10-CM | POA: Diagnosis not present

## 2023-06-23 DIAGNOSIS — Z79899 Other long term (current) drug therapy: Secondary | ICD-10-CM | POA: Diagnosis not present

## 2023-06-23 DIAGNOSIS — N186 End stage renal disease: Secondary | ICD-10-CM | POA: Diagnosis not present

## 2023-06-23 DIAGNOSIS — E440 Moderate protein-calorie malnutrition: Secondary | ICD-10-CM | POA: Diagnosis not present

## 2023-06-23 DIAGNOSIS — E878 Other disorders of electrolyte and fluid balance, not elsewhere classified: Secondary | ICD-10-CM | POA: Diagnosis not present

## 2023-06-25 DIAGNOSIS — E440 Moderate protein-calorie malnutrition: Secondary | ICD-10-CM | POA: Diagnosis not present

## 2023-06-25 DIAGNOSIS — R7881 Bacteremia: Secondary | ICD-10-CM | POA: Diagnosis not present

## 2023-06-25 DIAGNOSIS — E1129 Type 2 diabetes mellitus with other diabetic kidney complication: Secondary | ICD-10-CM | POA: Diagnosis not present

## 2023-06-25 DIAGNOSIS — N2589 Other disorders resulting from impaired renal tubular function: Secondary | ICD-10-CM | POA: Diagnosis not present

## 2023-06-25 DIAGNOSIS — N2581 Secondary hyperparathyroidism of renal origin: Secondary | ICD-10-CM | POA: Diagnosis not present

## 2023-06-25 DIAGNOSIS — E878 Other disorders of electrolyte and fluid balance, not elsewhere classified: Secondary | ICD-10-CM | POA: Diagnosis not present

## 2023-06-25 DIAGNOSIS — Z992 Dependence on renal dialysis: Secondary | ICD-10-CM | POA: Diagnosis not present

## 2023-06-25 DIAGNOSIS — R17 Unspecified jaundice: Secondary | ICD-10-CM | POA: Diagnosis not present

## 2023-06-25 DIAGNOSIS — D631 Anemia in chronic kidney disease: Secondary | ICD-10-CM | POA: Diagnosis not present

## 2023-06-25 DIAGNOSIS — Z79899 Other long term (current) drug therapy: Secondary | ICD-10-CM | POA: Diagnosis not present

## 2023-06-25 DIAGNOSIS — N186 End stage renal disease: Secondary | ICD-10-CM | POA: Diagnosis not present

## 2023-06-25 DIAGNOSIS — D509 Iron deficiency anemia, unspecified: Secondary | ICD-10-CM | POA: Diagnosis not present

## 2023-06-25 DIAGNOSIS — D689 Coagulation defect, unspecified: Secondary | ICD-10-CM | POA: Diagnosis not present

## 2023-06-27 DIAGNOSIS — Z992 Dependence on renal dialysis: Secondary | ICD-10-CM | POA: Diagnosis not present

## 2023-06-27 DIAGNOSIS — N2589 Other disorders resulting from impaired renal tubular function: Secondary | ICD-10-CM | POA: Diagnosis not present

## 2023-06-27 DIAGNOSIS — E1129 Type 2 diabetes mellitus with other diabetic kidney complication: Secondary | ICD-10-CM | POA: Diagnosis not present

## 2023-06-27 DIAGNOSIS — E440 Moderate protein-calorie malnutrition: Secondary | ICD-10-CM | POA: Diagnosis not present

## 2023-06-27 DIAGNOSIS — D631 Anemia in chronic kidney disease: Secondary | ICD-10-CM | POA: Diagnosis not present

## 2023-06-27 DIAGNOSIS — R7881 Bacteremia: Secondary | ICD-10-CM | POA: Diagnosis not present

## 2023-06-27 DIAGNOSIS — D509 Iron deficiency anemia, unspecified: Secondary | ICD-10-CM | POA: Diagnosis not present

## 2023-06-27 DIAGNOSIS — E878 Other disorders of electrolyte and fluid balance, not elsewhere classified: Secondary | ICD-10-CM | POA: Diagnosis not present

## 2023-06-27 DIAGNOSIS — D689 Coagulation defect, unspecified: Secondary | ICD-10-CM | POA: Diagnosis not present

## 2023-06-27 DIAGNOSIS — N2581 Secondary hyperparathyroidism of renal origin: Secondary | ICD-10-CM | POA: Diagnosis not present

## 2023-06-27 DIAGNOSIS — Z79899 Other long term (current) drug therapy: Secondary | ICD-10-CM | POA: Diagnosis not present

## 2023-06-27 DIAGNOSIS — R17 Unspecified jaundice: Secondary | ICD-10-CM | POA: Diagnosis not present

## 2023-06-27 DIAGNOSIS — N186 End stage renal disease: Secondary | ICD-10-CM | POA: Diagnosis not present

## 2023-06-30 DIAGNOSIS — N186 End stage renal disease: Secondary | ICD-10-CM | POA: Diagnosis not present

## 2023-06-30 DIAGNOSIS — N2581 Secondary hyperparathyroidism of renal origin: Secondary | ICD-10-CM | POA: Diagnosis not present

## 2023-06-30 DIAGNOSIS — R52 Pain, unspecified: Secondary | ICD-10-CM | POA: Diagnosis not present

## 2023-06-30 DIAGNOSIS — Z992 Dependence on renal dialysis: Secondary | ICD-10-CM | POA: Diagnosis not present

## 2023-06-30 DIAGNOSIS — R7881 Bacteremia: Secondary | ICD-10-CM | POA: Diagnosis not present

## 2023-06-30 DIAGNOSIS — D689 Coagulation defect, unspecified: Secondary | ICD-10-CM | POA: Diagnosis not present

## 2023-06-30 DIAGNOSIS — D509 Iron deficiency anemia, unspecified: Secondary | ICD-10-CM | POA: Diagnosis not present

## 2023-07-02 DIAGNOSIS — D689 Coagulation defect, unspecified: Secondary | ICD-10-CM | POA: Diagnosis not present

## 2023-07-02 DIAGNOSIS — D509 Iron deficiency anemia, unspecified: Secondary | ICD-10-CM | POA: Diagnosis not present

## 2023-07-02 DIAGNOSIS — R7881 Bacteremia: Secondary | ICD-10-CM | POA: Diagnosis not present

## 2023-07-02 DIAGNOSIS — R52 Pain, unspecified: Secondary | ICD-10-CM | POA: Diagnosis not present

## 2023-07-02 DIAGNOSIS — N2581 Secondary hyperparathyroidism of renal origin: Secondary | ICD-10-CM | POA: Diagnosis not present

## 2023-07-02 DIAGNOSIS — Z992 Dependence on renal dialysis: Secondary | ICD-10-CM | POA: Diagnosis not present

## 2023-07-02 DIAGNOSIS — N186 End stage renal disease: Secondary | ICD-10-CM | POA: Diagnosis not present

## 2023-07-03 ENCOUNTER — Other Ambulatory Visit (HOSPITAL_COMMUNITY): Payer: Self-pay

## 2023-07-03 ENCOUNTER — Encounter (HOSPITAL_COMMUNITY): Payer: Self-pay | Admitting: *Deleted

## 2023-07-04 DIAGNOSIS — R52 Pain, unspecified: Secondary | ICD-10-CM | POA: Diagnosis not present

## 2023-07-04 DIAGNOSIS — N186 End stage renal disease: Secondary | ICD-10-CM | POA: Diagnosis not present

## 2023-07-04 DIAGNOSIS — D509 Iron deficiency anemia, unspecified: Secondary | ICD-10-CM | POA: Diagnosis not present

## 2023-07-04 DIAGNOSIS — N2581 Secondary hyperparathyroidism of renal origin: Secondary | ICD-10-CM | POA: Diagnosis not present

## 2023-07-04 DIAGNOSIS — R7881 Bacteremia: Secondary | ICD-10-CM | POA: Diagnosis not present

## 2023-07-04 DIAGNOSIS — D689 Coagulation defect, unspecified: Secondary | ICD-10-CM | POA: Diagnosis not present

## 2023-07-04 DIAGNOSIS — Z992 Dependence on renal dialysis: Secondary | ICD-10-CM | POA: Diagnosis not present

## 2023-07-06 ENCOUNTER — Other Ambulatory Visit (HOSPITAL_COMMUNITY): Payer: Self-pay

## 2023-07-06 ENCOUNTER — Ambulatory Visit
Admission: EM | Admit: 2023-07-06 | Discharge: 2023-07-06 | Disposition: A | Discharge status: Home / Self Care | Payer: Medicaid Other | Attending: Internal Medicine | Admitting: Internal Medicine

## 2023-07-06 ENCOUNTER — Other Ambulatory Visit: Payer: Self-pay

## 2023-07-06 DIAGNOSIS — L03031 Cellulitis of right toe: Secondary | ICD-10-CM

## 2023-07-06 MED ORDER — MUPIROCIN CALCIUM 2 % EX CREA
1.0000 | TOPICAL_CREAM | Freq: Two times a day (BID) | CUTANEOUS | 0 refills | Status: DC
  Filled 2023-07-06: qty 15, 3d supply, fill #0

## 2023-07-06 MED ORDER — DOXYCYCLINE HYCLATE 100 MG PO CAPS
100.0000 mg | ORAL_CAPSULE | Freq: Two times a day (BID) | ORAL | 0 refills | Status: DC
Start: 2023-07-06 — End: 2023-09-04
  Filled 2023-07-06: qty 20, 10d supply, fill #0

## 2023-07-06 NOTE — ED Triage Notes (Addendum)
Pt presents to UC w/ c/o corn on 4th toe of right foot x1 week. Pt states he's been "picking at it" which has caused swelling and pain. Swelling is present in the right foot and ankle. Pain radiates to right shin area. Pt reports he has taken tylenol and has been cleaning it with peroxide. Denies drainage.

## 2023-07-06 NOTE — ED Provider Notes (Addendum)
[facility]    CSN: [id number] Arrival date & time: 07/06/23  1155      History   Chief Complaint Chief Complaint  Patient presents with   Toe Pain    HPI Jeremy [name] is a 42 y.o. male presents for evaluation of a toe infection.  Patient reports he had a corn on the top of his right fourth toe for about a week.  He states he has been picking at it and over the past 3 days he has had swelling and pain to the area.  Does endorse some clear to yellow drainage as well.  States the swelling is extending into his foot.  No fevers or chills.  He does have a complex medical history including chronic kidney disease on hemodialysis as well as diabetes type 2.  He has not been treating with any OTC medications but has been clean with hydrogen peroxide.  No other concerns at this time.   Toe Pain    Past Medical History:  Diagnosis Date   Abscess of left groin    Acute blood loss anemia 11/11/2013   Anemia in chronic kidney disease (CKD)    Asthma    Boil of scrotum 11/21/2015   Chest pain    a. 01/2015 Lexiscan MV: EF 28%, inferior, inferolateral, apical ischemia;  b. 01/2015 Cath: nl cors, PCWP 18 mmHg, CO 9.38 L/min, CI 3.53 L/min/m^2.   CHF (congestive heart failure) (HCC)    Depression    Situational   ESRD (end stage renal disease) (HCC)    TTHS- [place]   Essential hypertension    Family history of adverse reaction to anesthesia    sister- "it was too much for her heart" died   GERD (gastroesophageal reflux disease)    Hyperlipidemia    Membranous glomerulonephritis    Morbid obesity (HCC)    Nonischemic cardiomyopathy (HCC)    a. 01/2015 Echo: EF 20-25%, diff HK, Gr 2 DD, Triv AI, mildly dil LA and Ao root.   PONV (postoperative nausea and vomiting)    Seizures (HCC) 02/29/2020   "electralytes were out of wack."   Type II diabetes mellitus (HCC)    a. 01/2015 HbA1c = 8.9.    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   Aortic valve insufficiency 07/18/2022   Catheter-related bloodstream infection 07/16/2022   Anemia, unspecified 10/26/2019   Coagulation defect, unspecified (HCC) 10/21/2019   Secondary hyperparathyroidism of renal origin (HCC) 10/21/2019   Mass of soft tissue of face 09/23/2018   Onychomycosis due to dermatophyte 03/29/2018   Anemia, iron deficiency 07/18/2016   Anemia in chronic kidney disease 07/18/2016   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, stage 5 (HCC) 05/29/2016   Scrotal abscess 11/21/2015   Suspected sleep apnea 05/24/2015   Chronic combined systolic and diastolic CHF (congestive heart failure) (HCC) 03/01/2015   Nonischemic cardiomyopathy (HCC)    Asthma    Hyperlipidemia 03/31/2012   Obesity, Class I, BMI 30-34.9 03/21/2011   Primary hypertension 03/21/2011   Gastroesophageal reflux disease 03/21/2011   Type 2 diabetes mellitus with  diabetic nephropathy (HCC) 02/20/2011    Past Surgical History:  Procedure Laterality Date   A/V FISTULAGRAM N/A 08/12/2021   Procedure: A/V FISTULAGRAM;  Surgeon: Maeola Harman, MD;  Location: [facility] INVASIVE CV LAB;  Service: Cardiovascular;  Laterality: N/A;   AV FISTULA PLACEMENT Right 12/18/2020   Procedure: RIGHT ARM RADIOCEPHALIC  ARTERIOVENOUS (AV) FISTULA CREATION;  Surgeon: Maeola Harman, MD;  Location: [facility] OR;  Service: Vascular;  Laterality: Right;   CARDIAC CATHETERIZATION N/A 02/01/2015   Procedure: Right/Left Heart Cath and Coronary Angiography;  Surgeon: Wendall Stade, MD;  Location: [facility] INVASIVE CV LAB;   Service: Cardiovascular;  Laterality: N/A;   IR FLUORO GUIDE CV LINE LEFT  07/21/2022   IR FLUORO GUIDE CV LINE LEFT  07/21/2022   IR FLUORO GUIDE CV LINE RIGHT  10/11/2019   IR REMOVAL TUN CV CATH W/O FL  07/17/2022   IR REMOVAL TUN CV CATH W/O FL  08/18/2022   IR US GUIDE VASC ACCESS LEFT  07/21/2022   IR US GUIDE VASC ACCESS LEFT  07/21/2022   IR US GUIDE VASC ACCESS RIGHT  10/11/2019   TEE WITHOUT CARDIOVERSION N/A 07/18/2022   Procedure: TRANSESOPHAGEAL ECHOCARDIOGRAM (TEE);  Surgeon: Maisie Fus, MD;  Location: [facility] ENDOSCOPY;  Service: Cardiovascular;  Laterality: N/A;   THORACOTOMY Left 11/08/2013   Procedure: LEFT THORACOTOMY;  Surgeon: Kerin Perna, MD;  Location: [facility] OR;  Service: Thoracic;  Laterality: Left;       Home Medications    Prior to Admission medications   Medication Sig Start Date End Date Taking? Authorizing Provider  doxycycline (VIBRAMYCIN) 100 MG capsule Take 1 capsule (100 mg total) by mouth 2 (two) times daily. 07/06/23  Yes Radford Pax, NP  mupirocin cream (BACTROBAN) 2 % Apply 1 Application topically 2 (two) times daily. 07/06/23  Yes Radford Pax, NP  Accu-Chek Softclix Lancets lancets Use as directed to check blood sugar four times daily. Patient not taking: Reported on 02/19/2023 02/24/22   Marolyn Haller, MD  acetaminophen (TYLENOL) 500 MG tablet Take 1,000 mg by mouth every 6 (six) hours as needed for moderate pain.    [provider]  amLODipine (NORVASC) 10 MG tablet Take 1 tablet (10 mg total) by mouth daily. 01/10/22   Tyson Alias, MD  atorvastatin (LIPITOR) 80 MG tablet Take 1 tablet (80 mg total) by mouth daily. 01/06/23   Bensimhon, Bevelyn Buckles, MD  AURYXIA 1 GM 210 MG(Fe) tablet Take 420 mg by mouth 3 (three) times daily. 08/05/21   [provider]  blood glucose meter kit and supplies KIT Dispense based on patient and insurance preference. Use up to four times daily as directed. (FOR ICD-9 250.00, 250.01). Patient not  taking: Reported on 02/19/2023 07/21/18   Angelita Ingles, MD  Blood Glucose Monitoring Suppl (ACCU-CHEK GUIDE) w/Device KIT Use to test blood sugar Patient not taking: Reported on 02/19/2023 02/24/22   Marolyn Haller, MD  Blood Pressure Monitoring (FORA P20 BLOOD PRESSURE CUFF) MISC Check your blood pressure everyday in the morning. 02/24/22   Marolyn Haller, MD  carvedilol (COREG) 25 MG tablet Take 2 tablets (50 mg total) by mouth 2 (two) times daily. 11/10/22   Laurey Morale, MD  carvedilol (COREG) 25 MG tablet Take 1 tablet (25 mg total) by mouth 2 (two) times daily with a meal. 03/19/23     carvedilol (COREG) 25 MG tablet Take 1 tablet by mouth twice a day with meals 05/19/23  cloNIDine (CATAPRES) 0.3 MG tablet Take 1 tablet (0.3 mg total) by mouth 2 (two) times daily as directed. 09/30/22     cloNIDine (CATAPRES) 0.3 MG tablet Take 1 tablet (0.3 mg total) by mouth 2 (two) times daily as directed. 04/10/23     ferric citrate (AURYXIA) 1 GM 210 MG(Fe) tablet Take 2 tablets (420 mg total) by mouth as directed with meals and 2 tablets (420 mg total) with snacks. 10/14/22     ferric citrate (AURYXIA) 1 GM 210 MG(Fe) tablet Take 2 tablets (420 mg total) by mouth with each meal and 2 tablets (420 mg total) with snacks. 10/14/22     ferric citrate (AURYXIA) 1 GM 210 MG(Fe) tablet Take 2 tablet by mouth as directed with meals and 2 with snacks 03/19/23     glipiZIDE (GLUCOTROL) 5 MG tablet Take 1 tablet (5 mg total) by mouth daily before breakfast. 04/06/23   Alexander-Savino, [place], MD  glucose blood (ACCU-CHEK GUIDE) test strip USE TO CHECK BLOOD SUGAR 4 TIMES DAILY Patient not taking: Reported on 02/19/2023 02/24/22   Marolyn Haller, MD  hydrALAZINE (APRESOLINE) 100 MG tablet Take 1 tablet (100 mg total) by mouth 3 (three) times daily. 01/31/22   Bensimhon, Bevelyn Buckles, MD  hydrALAZINE (APRESOLINE) 50 MG tablet Take 1 tablet (50 mg total) by mouth 3 (three) times daily. 04/28/23     hydrALAZINE  (APRESOLINE) 50 MG tablet Take 1 tablet (50 mg total) by mouth 3 (three) times daily. 06/16/23     insulin aspart (NOVOLOG FLEXPEN) 100 UNIT/ML FlexPen Inject 15 Units into the skin 3 (three) times daily with meals. Patient taking differently: Inject 17 Units into the skin 3 (three) times daily with meals. 03/25/22 02/19/23  Masters, Katie, DO  insulin aspart (NOVOLOG) 100 UNIT/ML FlexPen Inject 15 Units into the skin 3 (three) times daily with meals. 04/28/23     insulin glargine-yfgn (SEMGLEE, YFGN,) 100 UNIT/ML Pen Inject 20 Units into the skin at bedtime. 09/19/22   Masters, Katie, DO  Insulin Pen Needle (PEN NEEDLES) 31G X 5 MM MISC Use four times daily with insulin. 08/26/22   Marolyn Haller, MD  multivitamin (RENA-VIT) TABS tablet Take 1 tablet by mouth daily.    [provider]  nitroGLYCERIN (NITROSTAT) 0.4 MG SL tablet PLACE 1 TABLET UNDER THE TONGUE EVERY 5 MINUTES AS NEEDED FOR CHEST PAIN. Patient not taking: Reported on 09/26/2022 06/28/19   Laurey Morale, MD  ondansetron (ZOFRAN) 4 MG tablet Take 1 tablet (4 mg total) by mouth daily as needed. 07/23/22     ondansetron (ZOFRAN) 4 MG tablet Take 1 tablet (4 mg total) by mouth daily as needed. 10/27/22     ondansetron (ZOFRAN) 4 MG tablet Take 1 tablet by mouth once a day as needed 06/04/23     RENVELA 800 MG tablet Take 800 mg by mouth daily. 07/28/21   [provider]    Family History Family History  Problem Relation Age of Onset   Diabetes Mellitus II Mother        died @ 20.   Gastric cancer Mother    CAD Father        died @ 43.   Heart attack Father    Congestive Heart Failure Father    Diabetes Mellitus II Sister    CAD Sister        s/p PCI - age 5.    Social History Social History   Tobacco Use   Smoking status: Never  Smokeless tobacco: Never  Vaping Use   Vaping status: Never Used  Substance Use Topics   Alcohol use: No    Alcohol/week: 0.0 standard drinks of alcohol   Drug use: Yes     Types: Marijuana    Comment: last used 09/15/2020     Allergies   Acyclovir and related and Imdur [isosorbide nitrate]   Review of Systems Review of Systems  Skin:  Positive for wound.     Physical Exam Triage Vital Signs ED Triage Vitals  Encounter Vitals Group     BP 07/06/23 1207 115/76     Systolic BP Percentile --      Diastolic BP Percentile --      Pulse Rate 07/06/23 1207 76     Resp 07/06/23 1207 16     Temp 07/06/23 1207 98 F (36.7 C)     Temp Source 07/06/23 1207 Oral     SpO2 07/06/23 1207 98 %     Weight --      Height --      Head Circumference --      Peak Flow --      Pain Score 07/06/23 1205 10     Pain Loc --      Pain Education --      Exclude from Growth Chart --    No data found.  Updated Vital Signs BP 115/76 (BP Location: Left Arm)   Pulse 76   Temp 98 F (36.7 C) (Oral)   Resp 16   SpO2 98%   Visual Acuity Right Eye Distance:   Left Eye Distance:   Bilateral Distance:    Right Eye Near:   Left Eye Near:    Bilateral Near:     Physical Exam Vitals and nursing note reviewed.  Constitutional:      General: He is not in acute distress.    Appearance: Normal appearance. He is not ill-appearing.  HENT:     Head: Normocephalic and atraumatic.  Eyes:     Pupils: Pupils are equal, round, and reactive to light.  Cardiovascular:     Rate and Rhythm: Normal rate.  Pulmonary:     Effort: Pulmonary effort is normal.  Musculoskeletal:       Feet:  Feet:     Comments: There is a corn to the mid dorsum of the right 4th toe. Moderate swelling of toe without drainage or erythema or warmth.  Minimal swelling to the fourth MTP joint.  Cap refill +2 Skin:    General: Skin is warm and dry.  Neurological:     General: No focal deficit present.     Mental Status: He is alert and oriented to person, place, and time.  Psychiatric:        Mood and Affect: Mood normal.        Behavior: Behavior normal.      UC Treatments / Results   Labs (all labs ordered are listed, but only abnormal results are displayed) Labs Reviewed - No data to display  Basic metabolic panel Order: [id number] Status: Final result     Visible to patient: Yes (not seen)     Next appt: None   0 Result Notes          Component Ref Range & Units 4 mo ago (02/18/23) 9 mo ago (09/19/22) 9 mo ago (09/18/22) 9 mo ago (09/18/22) 9 mo ago (09/18/22) 9 mo ago (09/18/22) 11 mo ago (07/22/22)  Sodium 135 - 145 mmol/L 136  138 140 142 144 141 138  Potassium 3.5 - 5.1 mmol/L 5.0 3.8 4.3 4.4 4.4 4.4 3.9  Chloride 98 - 111 mmol/L 101 103 106  110 105 99  CO2 22 - 32 mmol/L 18 Low  25 21 Low    20 Low  27  Glucose, Bld 70 - 99 mg/dL 161 High  096 High  CM 222 High  CM  263 High  CM 257 High  CM 170 High  CM  Comment: Glucose reference range applies only to samples taken after fasting for at least 8 hours.  BUN 6 - 20 mg/dL 88 High  41 High  65 High   62 High  68 High  34 High   Creatinine, Ser 0.61 - 1.24 mg/dL 04.54 High  0.98 High  11.13 High   12.50 High  10.96 High  7.51 High   Calcium 8.9 - 10.3 mg/dL 9.3 8.5 Low  9.5   9.4 8.6 Low   GFR, Estimated >60 mL/min 3 Low  8 Low  CM 5 Low  CM   5 Low  CM 9 Low  CM  Comment: (NOTE) Calculated using the CKD-EPI Creatinine Equation (2021)  Anion gap 5 - 15 17 High  10 CM 13 CM   16 High  CM 12 CM  Comment: Performed at [facility], [address], [place], [place] [address]  Resulting Agency [facility]         Specimen Collected: 02/18/23 09:39      EKG   Radiology No results found.  Procedures Procedures (including critical care time)  Medications Ordered in UC Medications - No data to display  Initial Impression / Assessment and Plan / UC Course  I have reviewed the triage vital signs and the nursing notes.  Pertinent labs & imaging results that were available during my care of the patient were  reviewed by me and considered in my medical decision making (see chart for details).     Reviewed exam and symptoms with patient.  No red flags.  Discussed cellulitis.  Wound was cleansed and dressed by nursing staff and will start doxycycline as well as topical mupirocin.  Contact information for podiatry provided and patient instruction to follow-up within 2 to 3 days with them and/or his PCP for continued monitoring.  Discussed risk of osteomyelitis given his history and advise close follow-up to prevent this.  Strict ER precautions reviewed and patient verbalized understanding. Final Clinical Impressions(s) / UC Diagnoses   Final diagnoses:  Cellulitis of toe of right foot     Discharge Instructions      Keep the toe clean and dry.  Apply mupirocin topically 2 times a day.  Start doxycycline twice daily for 10 days.  Please follow-up with your PCP as well as podiatry for recheck in 2 to 3 days.  Please go to the ER if you develop any worsening symptoms.  This includes but is not limited to fevers, chills, worsening swelling of the toe, or any new concerns that arise.  I hope you feel better soon!    ED Prescriptions     Medication Sig Dispense Auth. Provider   mupirocin cream (BACTROBAN) 2 % Apply 1 Application topically 2 (two) times daily. 15 g Radford Pax, NP   doxycycline (VIBRAMYCIN) 100 MG capsule Take 1 capsule (100 mg total) by mouth 2 (two)  times daily. 20 capsule Radford Pax, NP      PDMP not reviewed this encounter.   Radford Pax, NP 07/06/23 1227    Radford Pax, NP 07/06/23 1228

## 2023-07-06 NOTE — Discharge Instructions (Signed)
Keep the toe clean and dry.  Apply mupirocin topically 2 times a day.  Start doxycycline twice daily for 10 days.  Please follow-up with your PCP as well as podiatry for recheck in 2 to 3 days.  Please go to the ER if you develop any worsening symptoms.  This includes but is not limited to fevers, chills, worsening swelling of the toe, or any new concerns that arise.  I hope you feel better soon!

## 2023-07-07 ENCOUNTER — Other Ambulatory Visit (HOSPITAL_COMMUNITY): Payer: Self-pay

## 2023-07-07 MED ORDER — NOVOLOG FLEXPEN 100 UNIT/ML ~~LOC~~ SOPN
15.0000 [IU] | PEN_INJECTOR | Freq: Three times a day (TID) | SUBCUTANEOUS | 0 refills | Status: DC
  Filled 2023-07-07: qty 9, 21d supply, fill #0

## 2023-07-08 DIAGNOSIS — N2581 Secondary hyperparathyroidism of renal origin: Secondary | ICD-10-CM | POA: Diagnosis not present

## 2023-07-08 DIAGNOSIS — N186 End stage renal disease: Secondary | ICD-10-CM | POA: Diagnosis not present

## 2023-07-08 DIAGNOSIS — Z992 Dependence on renal dialysis: Secondary | ICD-10-CM | POA: Diagnosis not present

## 2023-07-09 DIAGNOSIS — E1129 Type 2 diabetes mellitus with other diabetic kidney complication: Secondary | ICD-10-CM | POA: Diagnosis not present

## 2023-07-09 DIAGNOSIS — Z992 Dependence on renal dialysis: Secondary | ICD-10-CM | POA: Diagnosis not present

## 2023-07-09 DIAGNOSIS — N186 End stage renal disease: Secondary | ICD-10-CM | POA: Diagnosis not present

## 2023-07-13 ENCOUNTER — Other Ambulatory Visit (HOSPITAL_COMMUNITY): Payer: Self-pay

## 2023-07-14 DIAGNOSIS — Z992 Dependence on renal dialysis: Secondary | ICD-10-CM | POA: Diagnosis not present

## 2023-07-14 DIAGNOSIS — D689 Coagulation defect, unspecified: Secondary | ICD-10-CM | POA: Diagnosis not present

## 2023-07-14 DIAGNOSIS — R7881 Bacteremia: Secondary | ICD-10-CM | POA: Diagnosis not present

## 2023-07-14 DIAGNOSIS — D509 Iron deficiency anemia, unspecified: Secondary | ICD-10-CM | POA: Diagnosis not present

## 2023-07-14 DIAGNOSIS — N2581 Secondary hyperparathyroidism of renal origin: Secondary | ICD-10-CM | POA: Diagnosis not present

## 2023-07-14 DIAGNOSIS — N186 End stage renal disease: Secondary | ICD-10-CM | POA: Diagnosis not present

## 2023-07-15 ENCOUNTER — Other Ambulatory Visit (HOSPITAL_COMMUNITY): Payer: Self-pay

## 2023-07-15 ENCOUNTER — Encounter: Payer: Self-pay | Admitting: Podiatry

## 2023-07-15 ENCOUNTER — Ambulatory Visit: Payer: Medicaid Other | Admitting: Podiatry

## 2023-07-15 ENCOUNTER — Ambulatory Visit (INDEPENDENT_AMBULATORY_CARE_PROVIDER_SITE_OTHER): Payer: Medicaid Other

## 2023-07-15 DIAGNOSIS — M779 Enthesopathy, unspecified: Secondary | ICD-10-CM

## 2023-07-15 DIAGNOSIS — Z01818 Encounter for other preprocedural examination: Secondary | ICD-10-CM | POA: Diagnosis not present

## 2023-07-15 DIAGNOSIS — M869 Osteomyelitis, unspecified: Secondary | ICD-10-CM | POA: Diagnosis not present

## 2023-07-15 DIAGNOSIS — M7751 Other enthesopathy of right foot: Secondary | ICD-10-CM | POA: Diagnosis not present

## 2023-07-15 MED ORDER — DOXYCYCLINE HYCLATE 100 MG PO TABS
100.0000 mg | ORAL_TABLET | Freq: Two times a day (BID) | ORAL | 0 refills | Status: DC
  Filled 2023-07-15: qty 20, 10d supply, fill #0

## 2023-07-15 NOTE — Progress Notes (Signed)
Subjective:  Patient ID: Jeremy [name], male    DOB: 1980/12/18,  MRN: [id number]  Chief Complaint  Patient presents with   Callouses    PATIENT STATES THAT HE WENT TO A URGENT CARE AND HE HAD A CORN ON HIS RF 4TH TOE AND IT GOT INFECTED SO THEY TOLD HIM TO COME TO [facility] AND ANKLE. PATIENT STATES HE IS NOT IN PAIN OTHER THEN THE SORENESS ON TOP OF HIS RF  PATIENT STATES SAYS HE HAS BEEN GOING THRU THIS FOR A WEEK NOW     42 y.o. male presents with the above complaint.  Patient presents with right fourth digit toe infection.  Patient states has been present for quite some time the wound has been present for few weeks.  He wanted get it evaluated at that he may end up losing the toe.  He currently does not have any systemic signs of infection.  He is not currently on any antibiotics.  His last A1c was 8.2%.   Review of Systems: Negative except as noted in the HPI. Denies N/V/F/Ch.  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 01/2015 Lexiscan MV: EF 28%, inferior, inferolateral, apical ischemia;  b. 01/2015 Cath: nl cors, PCWP 18 mmHg, CO 9.38 L/min, CI 3.53 L/min/m^2.   CHF (congestive heart failure) (HCC)    Depression    Situational   ESRD (end stage renal disease) (HCC)    [facility]- [place]   Essential hypertension    Family history of adverse reaction to anesthesia    sister- "it was too much for her heart" died   GERD (gastroesophageal reflux disease)    Hyperlipidemia    Membranous glomerulonephritis    Morbid obesity (HCC)    Nonischemic cardiomyopathy (HCC)    a. 01/2015 Echo: EF 20-25%, diff HK, Gr 2 DD, Triv AI, mildly dil LA and Ao root.   PONV (postoperative nausea and vomiting)    Seizures (HCC) 02/29/2020   "electralytes were out of wack."   Type II diabetes mellitus (HCC)    a. 01/2015 HbA1c = 8.9.     Current Outpatient Medications:    Accu-Chek Softclix Lancets lancets, Use as directed to check blood sugar four times daily., Disp: 400 each, Rfl: 1   acetaminophen (TYLENOL) 500 MG tablet, Take 1,000 mg by mouth every 6 (six) hours as needed for moderate pain., Disp: , Rfl:    amLODipine (NORVASC) 10 MG tablet, Take 1 tablet (10 mg total) by mouth daily., Disp: 90 tablet, Rfl: 3   atorvastatin (LIPITOR) 80 MG tablet, Take 1 tablet (80 mg total) by mouth daily., Disp: 90 tablet, Rfl: 3   AURYXIA 1 GM 210 MG(Fe) tablet, Take 420 mg by mouth 3 (three) times daily., Disp: , Rfl:    blood glucose meter kit and supplies KIT, Dispense based on patient and insurance preference. Use up to four times daily as directed. (FOR ICD-9 250.00, 250.01)., Disp: 1 each, Rfl: 0   Blood Glucose Monitoring Suppl (ACCU-CHEK GUIDE) w/Device KIT, Use to test blood sugar, Disp: 1 kit, Rfl: 0   Blood Pressure Monitoring (FORA P20 BLOOD PRESSURE CUFF) MISC, Check your blood pressure everyday in the morning., Disp: 1 each, Rfl: 0   carvedilol (COREG) 25 MG tablet, Take 2 tablets (50 mg  total) by mouth 2 (two) times daily., Disp: 120 tablet, Rfl: 11   carvedilol (COREG) 25 MG tablet, Take 1 tablet (25 mg total) by mouth 2 (two) times daily with a meal., Disp: 180 tablet, Rfl: 3   carvedilol (COREG) 25 MG tablet, Take 1 tablet by mouth twice a day with meals, Disp: 180 tablet, Rfl: 3   cloNIDine (CATAPRES) 0.3 MG tablet, Take 1 tablet (0.3 mg total) by mouth 2 (two) times daily as directed., Disp: 180 tablet, Rfl: 4   cloNIDine (CATAPRES) 0.3 MG tablet, Take 1 tablet (0.3 mg total) by mouth 2 (two) times daily as directed., Disp: 180 tablet, Rfl: 4   doxycycline (VIBRA-TABS) 100 MG tablet, Take 1 tablet (100 mg total) by mouth 2 (two) times daily., Disp: 20 tablet, Rfl: 0   doxycycline (VIBRAMYCIN) 100 MG capsule, Take 1 capsule (100 mg total) by mouth 2 (two) times daily., Disp: 20 capsule, Rfl: 0   ferric citrate  (AURYXIA) 1 GM 210 MG(Fe) tablet, Take 2 tablets (420 mg total) by mouth as directed with meals and 2 tablets (420 mg total) with snacks., Disp: 300 tablet, Rfl: 11   ferric citrate (AURYXIA) 1 GM 210 MG(Fe) tablet, Take 2 tablets (420 mg total) by mouth with each meal and 2 tablets (420 mg total) with snacks., Disp: 300 tablet, Rfl: 11   ferric citrate (AURYXIA) 1 GM 210 MG(Fe) tablet, Take 2 tablet by mouth as directed with meals and 2 with snacks, Disp: 300 tablet, Rfl: 11   glipiZIDE (GLUCOTROL) 5 MG tablet, Take 1 tablet (5 mg total) by mouth daily before breakfast., Disp: 90 tablet, Rfl: 1   glucose blood (ACCU-CHEK GUIDE) test strip, USE TO CHECK BLOOD SUGAR 4 TIMES DAILY, Disp: 400 strip, Rfl: 4   hydrALAZINE (APRESOLINE) 100 MG tablet, Take 1 tablet (100 mg total) by mouth 3 (three) times daily., Disp: 180 tablet, Rfl: 5   hydrALAZINE (APRESOLINE) 50 MG tablet, Take 1 tablet (50 mg total) by mouth 3 (three) times daily., Disp: 90 tablet, Rfl: 6   hydrALAZINE (APRESOLINE) 50 MG tablet, Take 1 tablet (50 mg total) by mouth 3 (three) times daily., Disp: 270 tablet, Rfl: 3   insulin aspart (NOVOLOG FLEXPEN) 100 UNIT/ML FlexPen, Inject 15 Units into the skin 3 (three) times daily with meals., Disp: 9 mL, Rfl: 0   insulin glargine-yfgn (SEMGLEE, YFGN,) 100 UNIT/ML Pen, Inject 20 Units into the skin at bedtime., Disp: 15 mL, Rfl: 11   Insulin Pen Needle (PEN NEEDLES) 31G X 5 MM MISC, Use four times daily with insulin., Disp: 300 each, Rfl: 2   multivitamin (RENA-VIT) TABS tablet, Take 1 tablet by mouth daily., Disp: , Rfl:    mupirocin cream (BACTROBAN) 2 %, Apply 1 Application topically 2 (two) times daily., Disp: 15 g, Rfl: 0   nitroGLYCERIN (NITROSTAT) 0.4 MG SL tablet, PLACE 1 TABLET UNDER THE TONGUE EVERY 5 MINUTES AS NEEDED FOR CHEST PAIN., Disp: 25 tablet, Rfl: 3   ondansetron (ZOFRAN) 4 MG tablet, Take 1 tablet (4 mg total) by mouth daily as needed., Disp: 30 tablet, Rfl: 11   ondansetron  (ZOFRAN) 4 MG tablet, Take 1 tablet (4 mg total) by mouth daily as needed., Disp: 30 tablet, Rfl: 11   ondansetron (ZOFRAN) 4 MG tablet, Take 1 tablet by mouth once a day as needed, Disp: 30 tablet, Rfl: 11   RENVELA 800 MG tablet, Take 800 mg by mouth daily., Disp: , Rfl:    insulin aspart (NOVOLOG FLEXPEN)  100 UNIT/ML FlexPen, Inject 15 Units into the skin 3 (three) times daily with meals. (Patient taking differently: Inject 17 Units into the skin 3 (three) times daily with meals.), Disp: 39 mL, Rfl: 3  Social History   Tobacco Use  Smoking Status Never  Smokeless Tobacco Never    Allergies  Allergen Reactions   Acyclovir And Related Hives   Imdur [Isosorbide Nitrate]     Headaches from nitrate   Objective:  There were no vitals filed for this visit. There is no height or weight on file to calculate BMI. Constitutional Well developed. Well nourished.  Vascular Dorsalis pedis pulses palpable bilaterally. Posterior tibial pulses palpable bilaterally. Capillary refill normal to all digits.  No cyanosis or clubbing noted. Pedal hair growth normal.  Neurologic Normal speech. Oriented to person, place, and time. Epicritic sensation to light touch grossly present bilaterally.  Dermatologic Right fourth digit ulceration at the PIPJ joint.  Probing down to deep tissue no purulent drainage noted no erythema or redness noted.  Sausage toe structure noted.  No other abnormalities identified no active purulent drainage noted  Orthopedic: Normal joint ROM without pain or crepitus bilaterally. No visible deformities. No bony tenderness.   Radiographs: None Assessment:   1. Toe osteomyelitis, right (HCC)   2. Encounter for preoperative examination for general surgical procedure    Plan:  Patient was evaluated and treated and all questions answered.  Right fourth toe acute osteomyelitis without any other clinical signs of infection -All questions and concerns were discussed with the  patient extensive detail.  Given the amount of infection that is present in setting of osteomyelitis patient will benefit from outpatient for digit amputation.  He has palpable pedal flow therefore no vascular abnormalities identified.  I discussed my preoperative intra postop plan with the patient extensive detail he states understanding like to proceed with surgery. -Informed surgical risk consent was reviewed and read aloud to the patient.  I reviewed the films.  I have discussed my findings with the patient in great detail.  I have discussed all risks including but not limited to infection, stiffness, scarring, limp, disability, deformity, damage to blood vessels and nerves, numbness, poor healing, need for braces, arthritis, chronic pain, amputation, death.  All benefits and realistic expectations discussed in great detail.  I have made no promises as to the outcome.  I have provided realistic expectations.  I have offered the patient a 2nd opinion, which they have declined and assured me they preferred to proceed despite the risks   No follow-ups on file.

## 2023-07-16 DIAGNOSIS — D509 Iron deficiency anemia, unspecified: Secondary | ICD-10-CM | POA: Diagnosis not present

## 2023-07-16 DIAGNOSIS — D689 Coagulation defect, unspecified: Secondary | ICD-10-CM | POA: Diagnosis not present

## 2023-07-16 DIAGNOSIS — Z992 Dependence on renal dialysis: Secondary | ICD-10-CM | POA: Diagnosis not present

## 2023-07-16 DIAGNOSIS — N2581 Secondary hyperparathyroidism of renal origin: Secondary | ICD-10-CM | POA: Diagnosis not present

## 2023-07-16 DIAGNOSIS — R7881 Bacteremia: Secondary | ICD-10-CM | POA: Diagnosis not present

## 2023-07-16 DIAGNOSIS — N186 End stage renal disease: Secondary | ICD-10-CM | POA: Diagnosis not present

## 2023-07-18 DIAGNOSIS — N2581 Secondary hyperparathyroidism of renal origin: Secondary | ICD-10-CM | POA: Diagnosis not present

## 2023-07-18 DIAGNOSIS — R7881 Bacteremia: Secondary | ICD-10-CM | POA: Diagnosis not present

## 2023-07-18 DIAGNOSIS — N186 End stage renal disease: Secondary | ICD-10-CM | POA: Diagnosis not present

## 2023-07-18 DIAGNOSIS — D689 Coagulation defect, unspecified: Secondary | ICD-10-CM | POA: Diagnosis not present

## 2023-07-18 DIAGNOSIS — Z992 Dependence on renal dialysis: Secondary | ICD-10-CM | POA: Diagnosis not present

## 2023-07-18 DIAGNOSIS — D509 Iron deficiency anemia, unspecified: Secondary | ICD-10-CM | POA: Diagnosis not present

## 2023-07-21 DIAGNOSIS — N186 End stage renal disease: Secondary | ICD-10-CM | POA: Diagnosis not present

## 2023-07-21 DIAGNOSIS — N2581 Secondary hyperparathyroidism of renal origin: Secondary | ICD-10-CM | POA: Diagnosis not present

## 2023-07-21 DIAGNOSIS — Z23 Encounter for immunization: Secondary | ICD-10-CM | POA: Diagnosis not present

## 2023-07-21 DIAGNOSIS — D509 Iron deficiency anemia, unspecified: Secondary | ICD-10-CM | POA: Diagnosis not present

## 2023-07-21 DIAGNOSIS — D631 Anemia in chronic kidney disease: Secondary | ICD-10-CM | POA: Diagnosis not present

## 2023-07-21 DIAGNOSIS — R7881 Bacteremia: Secondary | ICD-10-CM | POA: Diagnosis not present

## 2023-07-21 DIAGNOSIS — Z992 Dependence on renal dialysis: Secondary | ICD-10-CM | POA: Diagnosis not present

## 2023-07-21 DIAGNOSIS — D689 Coagulation defect, unspecified: Secondary | ICD-10-CM | POA: Diagnosis not present

## 2023-07-23 ENCOUNTER — Other Ambulatory Visit (HOSPITAL_COMMUNITY): Payer: Self-pay

## 2023-07-23 DIAGNOSIS — Z992 Dependence on renal dialysis: Secondary | ICD-10-CM | POA: Diagnosis not present

## 2023-07-23 DIAGNOSIS — N186 End stage renal disease: Secondary | ICD-10-CM | POA: Diagnosis not present

## 2023-07-23 DIAGNOSIS — D689 Coagulation defect, unspecified: Secondary | ICD-10-CM | POA: Diagnosis not present

## 2023-07-23 DIAGNOSIS — D509 Iron deficiency anemia, unspecified: Secondary | ICD-10-CM | POA: Diagnosis not present

## 2023-07-23 DIAGNOSIS — N2581 Secondary hyperparathyroidism of renal origin: Secondary | ICD-10-CM | POA: Diagnosis not present

## 2023-07-23 DIAGNOSIS — R7881 Bacteremia: Secondary | ICD-10-CM | POA: Diagnosis not present

## 2023-07-23 DIAGNOSIS — D631 Anemia in chronic kidney disease: Secondary | ICD-10-CM | POA: Diagnosis not present

## 2023-07-23 DIAGNOSIS — Z23 Encounter for immunization: Secondary | ICD-10-CM | POA: Diagnosis not present

## 2023-07-23 MED ORDER — NITROGLYCERIN 0.4 MG SL SUBL
0.4000 mg | SUBLINGUAL_TABLET | SUBLINGUAL | 6 refills | Status: DC | PRN
  Filled 2023-07-23: qty 25, 10d supply, fill #0

## 2023-07-23 MED ORDER — PANTOPRAZOLE SODIUM 20 MG PO TBEC
20.0000 mg | DELAYED_RELEASE_TABLET | Freq: Every day | ORAL | 11 refills | Status: DC | PRN
  Filled 2023-07-23: qty 30, 30d supply, fill #0
  Filled 2023-08-31: qty 30, 30d supply, fill #1
  Filled 2023-10-08: qty 30, 30d supply, fill #2

## 2023-07-23 MED ORDER — ONDANSETRON HCL 4 MG PO TABS
4.0000 mg | ORAL_TABLET | Freq: Every day | ORAL | 3 refills | Status: DC | PRN
  Filled 2023-07-23: qty 30, 30d supply, fill #0
  Filled 2023-08-31: qty 30, 30d supply, fill #1

## 2023-07-25 DIAGNOSIS — N186 End stage renal disease: Secondary | ICD-10-CM | POA: Diagnosis not present

## 2023-07-25 DIAGNOSIS — Z992 Dependence on renal dialysis: Secondary | ICD-10-CM | POA: Diagnosis not present

## 2023-07-25 DIAGNOSIS — D509 Iron deficiency anemia, unspecified: Secondary | ICD-10-CM | POA: Diagnosis not present

## 2023-07-25 DIAGNOSIS — Z23 Encounter for immunization: Secondary | ICD-10-CM | POA: Diagnosis not present

## 2023-07-25 DIAGNOSIS — D689 Coagulation defect, unspecified: Secondary | ICD-10-CM | POA: Diagnosis not present

## 2023-07-25 DIAGNOSIS — D631 Anemia in chronic kidney disease: Secondary | ICD-10-CM | POA: Diagnosis not present

## 2023-07-25 DIAGNOSIS — N2581 Secondary hyperparathyroidism of renal origin: Secondary | ICD-10-CM | POA: Diagnosis not present

## 2023-07-25 DIAGNOSIS — R7881 Bacteremia: Secondary | ICD-10-CM | POA: Diagnosis not present

## 2023-07-28 DIAGNOSIS — D631 Anemia in chronic kidney disease: Secondary | ICD-10-CM | POA: Diagnosis not present

## 2023-07-28 DIAGNOSIS — N2581 Secondary hyperparathyroidism of renal origin: Secondary | ICD-10-CM | POA: Diagnosis not present

## 2023-07-28 DIAGNOSIS — Z992 Dependence on renal dialysis: Secondary | ICD-10-CM | POA: Diagnosis not present

## 2023-07-28 DIAGNOSIS — N186 End stage renal disease: Secondary | ICD-10-CM | POA: Diagnosis not present

## 2023-07-28 DIAGNOSIS — R7881 Bacteremia: Secondary | ICD-10-CM | POA: Diagnosis not present

## 2023-07-28 DIAGNOSIS — D689 Coagulation defect, unspecified: Secondary | ICD-10-CM | POA: Diagnosis not present

## 2023-07-29 ENCOUNTER — Telehealth: Payer: Self-pay | Admitting: Podiatry

## 2023-07-29 NOTE — Telephone Encounter (Signed)
DOS- 08/10/2023  AMPUTATION MPJ 4TH [id number]  [employer]ICA[employer] EFFECTIVE DATE- 09/08/2021  SPOKE WITH SERVANT M. FROM [employer] AND SHE STATED THAT PRIOR AUTH IS NOT REQUIRED FOR CPT CODE 14782.  CALL REFERENCE NUMBER: I - [id number]

## 2023-07-30 ENCOUNTER — Other Ambulatory Visit (HOSPITAL_COMMUNITY): Payer: Self-pay

## 2023-07-30 ENCOUNTER — Other Ambulatory Visit: Payer: Self-pay

## 2023-07-30 DIAGNOSIS — D631 Anemia in chronic kidney disease: Secondary | ICD-10-CM | POA: Diagnosis not present

## 2023-07-30 DIAGNOSIS — Z992 Dependence on renal dialysis: Secondary | ICD-10-CM | POA: Diagnosis not present

## 2023-07-30 DIAGNOSIS — N2581 Secondary hyperparathyroidism of renal origin: Secondary | ICD-10-CM | POA: Diagnosis not present

## 2023-07-30 DIAGNOSIS — R7881 Bacteremia: Secondary | ICD-10-CM | POA: Diagnosis not present

## 2023-07-30 DIAGNOSIS — N186 End stage renal disease: Secondary | ICD-10-CM | POA: Diagnosis not present

## 2023-07-30 DIAGNOSIS — D689 Coagulation defect, unspecified: Secondary | ICD-10-CM | POA: Diagnosis not present

## 2023-07-31 ENCOUNTER — Other Ambulatory Visit (HOSPITAL_COMMUNITY): Payer: Self-pay

## 2023-08-01 DIAGNOSIS — D631 Anemia in chronic kidney disease: Secondary | ICD-10-CM | POA: Diagnosis not present

## 2023-08-01 DIAGNOSIS — Z992 Dependence on renal dialysis: Secondary | ICD-10-CM | POA: Diagnosis not present

## 2023-08-01 DIAGNOSIS — R7881 Bacteremia: Secondary | ICD-10-CM | POA: Diagnosis not present

## 2023-08-01 DIAGNOSIS — D689 Coagulation defect, unspecified: Secondary | ICD-10-CM | POA: Diagnosis not present

## 2023-08-01 DIAGNOSIS — N2581 Secondary hyperparathyroidism of renal origin: Secondary | ICD-10-CM | POA: Diagnosis not present

## 2023-08-01 DIAGNOSIS — N186 End stage renal disease: Secondary | ICD-10-CM | POA: Diagnosis not present

## 2023-08-03 ENCOUNTER — Telehealth: Payer: Self-pay

## 2023-08-03 ENCOUNTER — Encounter: Payer: Self-pay | Admitting: Podiatry

## 2023-08-03 ENCOUNTER — Telehealth: Payer: Self-pay | Admitting: Podiatry

## 2023-08-03 NOTE — Telephone Encounter (Signed)
Pt called the surgery center stating that he would like to cancel and  reschedule his 08/10/2023 surgery with Dr.Patel.

## 2023-08-03 NOTE — Telephone Encounter (Signed)
Patient called and left a message - states that you were going to send in 30 days of antibiotic, but he only got 10 days Did he misunderstand? Or did the plan change? Please advise. thanks

## 2023-08-04 ENCOUNTER — Other Ambulatory Visit: Payer: Self-pay | Admitting: Podiatry

## 2023-08-04 ENCOUNTER — Other Ambulatory Visit (HOSPITAL_COMMUNITY): Payer: Self-pay

## 2023-08-04 MED ORDER — DOXYCYCLINE HYCLATE 100 MG PO TABS
100.0000 mg | ORAL_TABLET | Freq: Two times a day (BID) | ORAL | 0 refills | Status: DC
  Filled 2023-08-04: qty 60, 30d supply, fill #0

## 2023-08-08 DIAGNOSIS — N186 End stage renal disease: Secondary | ICD-10-CM | POA: Diagnosis not present

## 2023-08-08 DIAGNOSIS — E1129 Type 2 diabetes mellitus with other diabetic kidney complication: Secondary | ICD-10-CM | POA: Diagnosis not present

## 2023-08-08 DIAGNOSIS — Z992 Dependence on renal dialysis: Secondary | ICD-10-CM | POA: Diagnosis not present

## 2023-08-10 ENCOUNTER — Other Ambulatory Visit (HOSPITAL_COMMUNITY): Payer: Self-pay

## 2023-08-10 ENCOUNTER — Other Ambulatory Visit: Payer: Self-pay | Admitting: Podiatry

## 2023-08-10 DIAGNOSIS — M86671 Other chronic osteomyelitis, right ankle and foot: Secondary | ICD-10-CM | POA: Diagnosis not present

## 2023-08-10 DIAGNOSIS — M86171 Other acute osteomyelitis, right ankle and foot: Secondary | ICD-10-CM | POA: Diagnosis not present

## 2023-08-10 DIAGNOSIS — M868X7 Other osteomyelitis, ankle and foot: Secondary | ICD-10-CM | POA: Diagnosis not present

## 2023-08-10 MED ORDER — IBUPROFEN 800 MG PO TABS
800.0000 mg | ORAL_TABLET | Freq: Four times a day (QID) | ORAL | 1 refills | Status: DC | PRN
  Filled 2023-08-10: qty 60, 15d supply, fill #0

## 2023-08-10 MED ORDER — OXYCODONE-ACETAMINOPHEN 5-325 MG PO TABS
1.0000 | ORAL_TABLET | ORAL | 0 refills | Status: DC | PRN
  Filled 2023-08-10: qty 30, 5d supply, fill #0

## 2023-08-11 ENCOUNTER — Encounter: Payer: Self-pay | Admitting: Podiatry

## 2023-08-11 DIAGNOSIS — R7881 Bacteremia: Secondary | ICD-10-CM | POA: Diagnosis not present

## 2023-08-11 DIAGNOSIS — D689 Coagulation defect, unspecified: Secondary | ICD-10-CM | POA: Diagnosis not present

## 2023-08-11 DIAGNOSIS — N2581 Secondary hyperparathyroidism of renal origin: Secondary | ICD-10-CM | POA: Diagnosis not present

## 2023-08-11 DIAGNOSIS — Z992 Dependence on renal dialysis: Secondary | ICD-10-CM | POA: Diagnosis not present

## 2023-08-11 DIAGNOSIS — N186 End stage renal disease: Secondary | ICD-10-CM | POA: Diagnosis not present

## 2023-08-11 DIAGNOSIS — D509 Iron deficiency anemia, unspecified: Secondary | ICD-10-CM | POA: Diagnosis not present

## 2023-08-13 DIAGNOSIS — D509 Iron deficiency anemia, unspecified: Secondary | ICD-10-CM | POA: Diagnosis not present

## 2023-08-13 DIAGNOSIS — D689 Coagulation defect, unspecified: Secondary | ICD-10-CM | POA: Diagnosis not present

## 2023-08-13 DIAGNOSIS — R7881 Bacteremia: Secondary | ICD-10-CM | POA: Diagnosis not present

## 2023-08-13 DIAGNOSIS — N186 End stage renal disease: Secondary | ICD-10-CM | POA: Diagnosis not present

## 2023-08-13 DIAGNOSIS — N2581 Secondary hyperparathyroidism of renal origin: Secondary | ICD-10-CM | POA: Diagnosis not present

## 2023-08-13 DIAGNOSIS — Z992 Dependence on renal dialysis: Secondary | ICD-10-CM | POA: Diagnosis not present

## 2023-08-14 ENCOUNTER — Encounter: Payer: Self-pay | Admitting: Podiatry

## 2023-08-14 DIAGNOSIS — D509 Iron deficiency anemia, unspecified: Secondary | ICD-10-CM | POA: Diagnosis not present

## 2023-08-14 DIAGNOSIS — N2581 Secondary hyperparathyroidism of renal origin: Secondary | ICD-10-CM | POA: Diagnosis not present

## 2023-08-14 DIAGNOSIS — N186 End stage renal disease: Secondary | ICD-10-CM | POA: Diagnosis not present

## 2023-08-14 DIAGNOSIS — Z992 Dependence on renal dialysis: Secondary | ICD-10-CM | POA: Diagnosis not present

## 2023-08-14 DIAGNOSIS — D689 Coagulation defect, unspecified: Secondary | ICD-10-CM | POA: Diagnosis not present

## 2023-08-14 DIAGNOSIS — R7881 Bacteremia: Secondary | ICD-10-CM | POA: Diagnosis not present

## 2023-08-17 DIAGNOSIS — N186 End stage renal disease: Secondary | ICD-10-CM | POA: Diagnosis not present

## 2023-08-17 DIAGNOSIS — D509 Iron deficiency anemia, unspecified: Secondary | ICD-10-CM | POA: Diagnosis not present

## 2023-08-17 DIAGNOSIS — N2581 Secondary hyperparathyroidism of renal origin: Secondary | ICD-10-CM | POA: Diagnosis not present

## 2023-08-17 DIAGNOSIS — D689 Coagulation defect, unspecified: Secondary | ICD-10-CM | POA: Diagnosis not present

## 2023-08-17 DIAGNOSIS — Z992 Dependence on renal dialysis: Secondary | ICD-10-CM | POA: Diagnosis not present

## 2023-08-17 DIAGNOSIS — R7881 Bacteremia: Secondary | ICD-10-CM | POA: Diagnosis not present

## 2023-08-19 ENCOUNTER — Encounter: Payer: Medicaid Other | Admitting: Podiatry

## 2023-08-21 DIAGNOSIS — D689 Coagulation defect, unspecified: Secondary | ICD-10-CM | POA: Diagnosis not present

## 2023-08-21 DIAGNOSIS — D509 Iron deficiency anemia, unspecified: Secondary | ICD-10-CM | POA: Diagnosis not present

## 2023-08-21 DIAGNOSIS — R7881 Bacteremia: Secondary | ICD-10-CM | POA: Diagnosis not present

## 2023-08-21 DIAGNOSIS — Z992 Dependence on renal dialysis: Secondary | ICD-10-CM | POA: Diagnosis not present

## 2023-08-21 DIAGNOSIS — N2581 Secondary hyperparathyroidism of renal origin: Secondary | ICD-10-CM | POA: Diagnosis not present

## 2023-08-21 DIAGNOSIS — N186 End stage renal disease: Secondary | ICD-10-CM | POA: Diagnosis not present

## 2023-08-24 DIAGNOSIS — Z992 Dependence on renal dialysis: Secondary | ICD-10-CM | POA: Diagnosis not present

## 2023-08-24 DIAGNOSIS — D631 Anemia in chronic kidney disease: Secondary | ICD-10-CM | POA: Diagnosis not present

## 2023-08-24 DIAGNOSIS — T8249XA Other complication of vascular dialysis catheter, initial encounter: Secondary | ICD-10-CM | POA: Diagnosis not present

## 2023-08-24 DIAGNOSIS — N186 End stage renal disease: Secondary | ICD-10-CM | POA: Diagnosis not present

## 2023-08-24 DIAGNOSIS — D689 Coagulation defect, unspecified: Secondary | ICD-10-CM | POA: Diagnosis not present

## 2023-08-24 DIAGNOSIS — N2581 Secondary hyperparathyroidism of renal origin: Secondary | ICD-10-CM | POA: Diagnosis not present

## 2023-08-27 DIAGNOSIS — N186 End stage renal disease: Secondary | ICD-10-CM | POA: Diagnosis not present

## 2023-08-27 DIAGNOSIS — Z992 Dependence on renal dialysis: Secondary | ICD-10-CM | POA: Diagnosis not present

## 2023-08-27 DIAGNOSIS — D689 Coagulation defect, unspecified: Secondary | ICD-10-CM | POA: Diagnosis not present

## 2023-08-27 DIAGNOSIS — D631 Anemia in chronic kidney disease: Secondary | ICD-10-CM | POA: Diagnosis not present

## 2023-08-27 DIAGNOSIS — T8249XA Other complication of vascular dialysis catheter, initial encounter: Secondary | ICD-10-CM | POA: Diagnosis not present

## 2023-08-27 DIAGNOSIS — N2581 Secondary hyperparathyroidism of renal origin: Secondary | ICD-10-CM | POA: Diagnosis not present

## 2023-08-31 ENCOUNTER — Other Ambulatory Visit (HOSPITAL_COMMUNITY): Payer: Self-pay

## 2023-09-03 ENCOUNTER — Ambulatory Visit: Payer: Medicaid Other | Admitting: Podiatry

## 2023-09-03 ENCOUNTER — Ambulatory Visit: Payer: Medicaid Other

## 2023-09-03 ENCOUNTER — Inpatient Hospital Stay (HOSPITAL_COMMUNITY)
Admission: EM | Admit: 2023-09-03 | Discharge: 2023-09-07 | DRG: 616 | Disposition: A | Discharge status: Home / Self Care | Payer: Medicaid Other | Attending: Internal Medicine | Admitting: Internal Medicine

## 2023-09-03 ENCOUNTER — Encounter: Payer: Self-pay | Admitting: Podiatry

## 2023-09-03 ENCOUNTER — Emergency Department (HOSPITAL_COMMUNITY): Payer: Medicaid Other

## 2023-09-03 DIAGNOSIS — L089 Local infection of the skin and subcutaneous tissue, unspecified: Secondary | ICD-10-CM

## 2023-09-03 DIAGNOSIS — Z794 Long term (current) use of insulin: Secondary | ICD-10-CM

## 2023-09-03 DIAGNOSIS — I77810 Thoracic aortic ectasia: Secondary | ICD-10-CM | POA: Diagnosis present

## 2023-09-03 DIAGNOSIS — J45909 Unspecified asthma, uncomplicated: Secondary | ICD-10-CM | POA: Diagnosis present

## 2023-09-03 DIAGNOSIS — I5020 Unspecified systolic (congestive) heart failure: Secondary | ICD-10-CM | POA: Diagnosis present

## 2023-09-03 DIAGNOSIS — E66811 Obesity, class 1: Secondary | ICD-10-CM | POA: Diagnosis present

## 2023-09-03 DIAGNOSIS — R569 Unspecified convulsions: Secondary | ICD-10-CM | POA: Diagnosis present

## 2023-09-03 DIAGNOSIS — Z800 Family history of malignant neoplasm of digestive organs: Secondary | ICD-10-CM

## 2023-09-03 DIAGNOSIS — E1121 Type 2 diabetes mellitus with diabetic nephropathy: Secondary | ICD-10-CM | POA: Diagnosis present

## 2023-09-03 DIAGNOSIS — D631 Anemia in chronic kidney disease: Secondary | ICD-10-CM | POA: Diagnosis present

## 2023-09-03 DIAGNOSIS — T8131XA Disruption of external operation (surgical) wound, not elsewhere classified, initial encounter: Secondary | ICD-10-CM | POA: Diagnosis not present

## 2023-09-03 DIAGNOSIS — F419 Anxiety disorder, unspecified: Secondary | ICD-10-CM | POA: Diagnosis present

## 2023-09-03 DIAGNOSIS — M7989 Other specified soft tissue disorders: Secondary | ICD-10-CM | POA: Diagnosis not present

## 2023-09-03 DIAGNOSIS — I132 Hypertensive heart and chronic kidney disease with heart failure and with stage 5 chronic kidney disease, or end stage renal disease: Secondary | ICD-10-CM | POA: Diagnosis present

## 2023-09-03 DIAGNOSIS — N186 End stage renal disease: Secondary | ICD-10-CM

## 2023-09-03 DIAGNOSIS — B964 Proteus (mirabilis) (morganii) as the cause of diseases classified elsewhere: Secondary | ICD-10-CM | POA: Diagnosis present

## 2023-09-03 DIAGNOSIS — I5042 Chronic combined systolic (congestive) and diastolic (congestive) heart failure: Secondary | ICD-10-CM | POA: Diagnosis present

## 2023-09-03 DIAGNOSIS — I10 Essential (primary) hypertension: Secondary | ICD-10-CM | POA: Diagnosis not present

## 2023-09-03 DIAGNOSIS — E11621 Type 2 diabetes mellitus with foot ulcer: Secondary | ICD-10-CM | POA: Diagnosis present

## 2023-09-03 DIAGNOSIS — Z7984 Long term (current) use of oral hypoglycemic drugs: Secondary | ICD-10-CM

## 2023-09-03 DIAGNOSIS — K219 Gastro-esophageal reflux disease without esophagitis: Secondary | ICD-10-CM | POA: Diagnosis present

## 2023-09-03 DIAGNOSIS — Z992 Dependence on renal dialysis: Secondary | ICD-10-CM

## 2023-09-03 DIAGNOSIS — Z888 Allergy status to other drugs, medicaments and biological substances status: Secondary | ICD-10-CM

## 2023-09-03 DIAGNOSIS — Z951 Presence of aortocoronary bypass graft: Secondary | ICD-10-CM

## 2023-09-03 DIAGNOSIS — L03119 Cellulitis of unspecified part of limb: Secondary | ICD-10-CM

## 2023-09-03 DIAGNOSIS — Z881 Allergy status to other antibiotic agents status: Secondary | ICD-10-CM

## 2023-09-03 DIAGNOSIS — M898X9 Other specified disorders of bone, unspecified site: Secondary | ICD-10-CM | POA: Diagnosis present

## 2023-09-03 DIAGNOSIS — E1140 Type 2 diabetes mellitus with diabetic neuropathy, unspecified: Secondary | ICD-10-CM | POA: Diagnosis present

## 2023-09-03 DIAGNOSIS — L02619 Cutaneous abscess of unspecified foot: Secondary | ICD-10-CM

## 2023-09-03 DIAGNOSIS — N2581 Secondary hyperparathyroidism of renal origin: Secondary | ICD-10-CM | POA: Diagnosis present

## 2023-09-03 DIAGNOSIS — I2510 Atherosclerotic heart disease of native coronary artery without angina pectoris: Secondary | ICD-10-CM | POA: Diagnosis present

## 2023-09-03 DIAGNOSIS — E1169 Type 2 diabetes mellitus with other specified complication: Principal | ICD-10-CM | POA: Diagnosis present

## 2023-09-03 DIAGNOSIS — Z79899 Other long term (current) drug therapy: Secondary | ICD-10-CM

## 2023-09-03 DIAGNOSIS — E11628 Type 2 diabetes mellitus with other skin complications: Principal | ICD-10-CM | POA: Diagnosis present

## 2023-09-03 DIAGNOSIS — E785 Hyperlipidemia, unspecified: Secondary | ICD-10-CM | POA: Diagnosis present

## 2023-09-03 DIAGNOSIS — Z833 Family history of diabetes mellitus: Secondary | ICD-10-CM

## 2023-09-03 DIAGNOSIS — L03116 Cellulitis of left lower limb: Secondary | ICD-10-CM | POA: Diagnosis present

## 2023-09-03 DIAGNOSIS — L97529 Non-pressure chronic ulcer of other part of left foot with unspecified severity: Secondary | ICD-10-CM | POA: Diagnosis not present

## 2023-09-03 DIAGNOSIS — L02612 Cutaneous abscess of left foot: Secondary | ICD-10-CM

## 2023-09-03 DIAGNOSIS — S90822A Blister (nonthermal), left foot, initial encounter: Secondary | ICD-10-CM | POA: Diagnosis not present

## 2023-09-03 DIAGNOSIS — I428 Other cardiomyopathies: Secondary | ICD-10-CM | POA: Diagnosis present

## 2023-09-03 DIAGNOSIS — I330 Acute and subacute infective endocarditis: Secondary | ICD-10-CM | POA: Diagnosis present

## 2023-09-03 DIAGNOSIS — I061 Rheumatic aortic insufficiency: Secondary | ICD-10-CM | POA: Diagnosis present

## 2023-09-03 DIAGNOSIS — M869 Osteomyelitis, unspecified: Secondary | ICD-10-CM | POA: Diagnosis present

## 2023-09-03 DIAGNOSIS — I4891 Unspecified atrial fibrillation: Secondary | ICD-10-CM | POA: Diagnosis not present

## 2023-09-03 DIAGNOSIS — Z8249 Family history of ischemic heart disease and other diseases of the circulatory system: Secondary | ICD-10-CM

## 2023-09-03 DIAGNOSIS — E1122 Type 2 diabetes mellitus with diabetic chronic kidney disease: Secondary | ICD-10-CM

## 2023-09-03 LAB — COMPREHENSIVE METABOLIC PANEL
ALT: 15 U/L (ref 0–44)
AST: 18 U/L (ref 15–41)
Albumin: 3 g/dL — ABNORMAL LOW (ref 3.5–5.0)
Alkaline Phosphatase: 45 U/L (ref 38–126)
Anion gap: 17 — ABNORMAL HIGH (ref 5–15)
BUN: 66 mg/dL — ABNORMAL HIGH (ref 6–20)
CO2: 23 mmol/L (ref 22–32)
Calcium: 9.5 mg/dL (ref 8.9–10.3)
Chloride: 96 mmol/L — ABNORMAL LOW (ref 98–111)
Creatinine, Ser: 14.94 mg/dL — ABNORMAL HIGH (ref 0.61–1.24)
GFR, Estimated: 4 mL/min — ABNORMAL LOW (ref >60)
Glucose, Bld: 173 mg/dL — ABNORMAL HIGH (ref 70–99)
Potassium: 4 mmol/L (ref 3.5–5.1)
Sodium: 136 mmol/L (ref 135–145)
Total Bilirubin: 0.7 mg/dL (ref <1.2)
Total Protein: 8 g/dL (ref 6.5–8.1)

## 2023-09-03 LAB — CBC WITH DIFFERENTIAL/PLATELET
Abs Immature Granulocytes: 0.1 10*3/uL — ABNORMAL HIGH (ref 0.00–0.07)
Basophils Absolute: 0.1 10*3/uL (ref 0.0–0.1)
Basophils Relative: 0 %
Eosinophils Absolute: 0.2 10*3/uL (ref 0.0–0.5)
Eosinophils Relative: 1 %
HCT: 35.1 % — ABNORMAL LOW (ref 39.0–52.0)
Hemoglobin: 11.1 g/dL — ABNORMAL LOW (ref 13.0–17.0)
Immature Granulocytes: 1 %
Lymphocytes Relative: 5 %
Lymphs Abs: 1 10*3/uL (ref 0.7–4.0)
MCH: 27 pg (ref 26.0–34.0)
MCHC: 31.6 g/dL (ref 30.0–36.0)
MCV: 85.4 fL (ref 80.0–100.0)
Monocytes Absolute: 1.2 10*3/uL — ABNORMAL HIGH (ref 0.1–1.0)
Monocytes Relative: 7 %
Neutro Abs: 16.2 10*3/uL — ABNORMAL HIGH (ref 1.7–7.7)
Neutrophils Relative %: 86 %
Platelets: 288 10*3/uL (ref 150–400)
RBC: 4.11 MIL/uL — ABNORMAL LOW (ref 4.22–5.81)
RDW: 18.5 % — ABNORMAL HIGH (ref 11.5–15.5)
WBC: 18.7 10*3/uL — ABNORMAL HIGH (ref 4.0–10.5)
nRBC: 0 % (ref 0.0–0.2)

## 2023-09-03 LAB — I-STAT CG4 LACTIC ACID, ED: Lactic Acid, Venous: 0.7 mmol/L (ref 0.5–1.9)

## 2023-09-03 NOTE — Progress Notes (Signed)
Subjective:   Patient ID: Jeremy [name], male   DOB: 42 y.o.   MRN: [id number]   HPI Patient presents stating that he is healing okay on his right foot but 3 days ago started to develop a lot of swelling in the left foot in the forefoot and he has a lesion underneath and he had to bear more weight on the foot as he was recovering from surgery right foot.  Has not noted any chills or fever   ROS      Objective:  Physical Exam  Neurovascular status unchanged incision site right fourth from amputation is healing with no erythema edema in the left foot unfortunately has quite a bit of edema forefoot into the midfoot with patient taking doxycycline also.  Plantarly there is keratotic tissue that when debrided does show some localized drainage and odor.  Patient does not have current indications of systemic infection but does have cellulitic event     Assessment:  Probability for abscess submetatarsal left with possibility of sepsis which is getting closer to occurring     Plan:  H&P reviewed with Jeremy [name].  At this point I have recommended going to the hospital and I had Jeremy [name] consult with me and we both agree that this is prudent and that he will need IV antibiotics MRI blood work and that hopefully we can avoid any amputation.  He is sent straight to the emergency room at this time awaiting Jeremy [name] tomorrow  X-rays were negative at this time for osteomyelitic event but still possible cannot be discounted

## 2023-09-03 NOTE — ED Triage Notes (Signed)
Pt referred to ED from wound doc for wound to his L second toe. He had it drained approximately one hour PTA and referred to ED for drainage. Pt recently had R toe amputated and is currently approximately one month of doxycycline for amputation.

## 2023-09-04 ENCOUNTER — Observation Stay (HOSPITAL_COMMUNITY): Payer: Medicaid Other

## 2023-09-04 ENCOUNTER — Other Ambulatory Visit: Payer: Self-pay

## 2023-09-04 DIAGNOSIS — E1122 Type 2 diabetes mellitus with diabetic chronic kidney disease: Secondary | ICD-10-CM | POA: Diagnosis not present

## 2023-09-04 DIAGNOSIS — E1140 Type 2 diabetes mellitus with diabetic neuropathy, unspecified: Secondary | ICD-10-CM | POA: Diagnosis not present

## 2023-09-04 DIAGNOSIS — I428 Other cardiomyopathies: Secondary | ICD-10-CM | POA: Diagnosis not present

## 2023-09-04 DIAGNOSIS — E1169 Type 2 diabetes mellitus with other specified complication: Secondary | ICD-10-CM | POA: Diagnosis not present

## 2023-09-04 DIAGNOSIS — I5042 Chronic combined systolic (congestive) and diastolic (congestive) heart failure: Secondary | ICD-10-CM | POA: Diagnosis not present

## 2023-09-04 DIAGNOSIS — M869 Osteomyelitis, unspecified: Secondary | ICD-10-CM

## 2023-09-04 DIAGNOSIS — E785 Hyperlipidemia, unspecified: Secondary | ICD-10-CM | POA: Diagnosis not present

## 2023-09-04 DIAGNOSIS — R569 Unspecified convulsions: Secondary | ICD-10-CM | POA: Diagnosis not present

## 2023-09-04 DIAGNOSIS — E11628 Type 2 diabetes mellitus with other skin complications: Secondary | ICD-10-CM | POA: Diagnosis not present

## 2023-09-04 DIAGNOSIS — D631 Anemia in chronic kidney disease: Secondary | ICD-10-CM | POA: Diagnosis not present

## 2023-09-04 DIAGNOSIS — L03119 Cellulitis of unspecified part of limb: Secondary | ICD-10-CM | POA: Diagnosis not present

## 2023-09-04 DIAGNOSIS — T8131XA Disruption of external operation (surgical) wound, not elsewhere classified, initial encounter: Secondary | ICD-10-CM | POA: Diagnosis not present

## 2023-09-04 DIAGNOSIS — Z794 Long term (current) use of insulin: Secondary | ICD-10-CM

## 2023-09-04 DIAGNOSIS — M7989 Other specified soft tissue disorders: Secondary | ICD-10-CM | POA: Diagnosis not present

## 2023-09-04 DIAGNOSIS — N186 End stage renal disease: Secondary | ICD-10-CM

## 2023-09-04 DIAGNOSIS — I77810 Thoracic aortic ectasia: Secondary | ICD-10-CM | POA: Diagnosis not present

## 2023-09-04 DIAGNOSIS — Z992 Dependence on renal dialysis: Secondary | ICD-10-CM | POA: Diagnosis not present

## 2023-09-04 DIAGNOSIS — L089 Local infection of the skin and subcutaneous tissue, unspecified: Secondary | ICD-10-CM | POA: Diagnosis not present

## 2023-09-04 DIAGNOSIS — I10 Essential (primary) hypertension: Secondary | ICD-10-CM | POA: Diagnosis not present

## 2023-09-04 DIAGNOSIS — I330 Acute and subacute infective endocarditis: Secondary | ICD-10-CM | POA: Diagnosis not present

## 2023-09-04 DIAGNOSIS — I4891 Unspecified atrial fibrillation: Secondary | ICD-10-CM | POA: Diagnosis not present

## 2023-09-04 DIAGNOSIS — M86172 Other acute osteomyelitis, left ankle and foot: Secondary | ICD-10-CM | POA: Diagnosis not present

## 2023-09-04 DIAGNOSIS — I5020 Unspecified systolic (congestive) heart failure: Secondary | ICD-10-CM | POA: Diagnosis not present

## 2023-09-04 DIAGNOSIS — L02612 Cutaneous abscess of left foot: Secondary | ICD-10-CM

## 2023-09-04 DIAGNOSIS — E66811 Obesity, class 1: Secondary | ICD-10-CM | POA: Diagnosis not present

## 2023-09-04 DIAGNOSIS — L03116 Cellulitis of left lower limb: Secondary | ICD-10-CM | POA: Diagnosis not present

## 2023-09-04 DIAGNOSIS — N2581 Secondary hyperparathyroidism of renal origin: Secondary | ICD-10-CM | POA: Diagnosis not present

## 2023-09-04 DIAGNOSIS — E11621 Type 2 diabetes mellitus with foot ulcer: Secondary | ICD-10-CM | POA: Diagnosis not present

## 2023-09-04 DIAGNOSIS — J45909 Unspecified asthma, uncomplicated: Secondary | ICD-10-CM | POA: Diagnosis not present

## 2023-09-04 DIAGNOSIS — I132 Hypertensive heart and chronic kidney disease with heart failure and with stage 5 chronic kidney disease, or end stage renal disease: Secondary | ICD-10-CM | POA: Diagnosis not present

## 2023-09-04 DIAGNOSIS — L97529 Non-pressure chronic ulcer of other part of left foot with unspecified severity: Secondary | ICD-10-CM | POA: Diagnosis not present

## 2023-09-04 LAB — CBC WITH DIFFERENTIAL/PLATELET
Abs Immature Granulocytes: 0.08 10*3/uL — ABNORMAL HIGH (ref 0.00–0.07)
Basophils Absolute: 0.1 10*3/uL (ref 0.0–0.1)
Basophils Relative: 0 %
Eosinophils Absolute: 0.3 10*3/uL (ref 0.0–0.5)
Eosinophils Relative: 2 %
HCT: 34 % — ABNORMAL LOW (ref 39.0–52.0)
Hemoglobin: 10.7 g/dL — ABNORMAL LOW (ref 13.0–17.0)
Immature Granulocytes: 0 %
Lymphocytes Relative: 8 %
Lymphs Abs: 1.5 10*3/uL (ref 0.7–4.0)
MCH: 26.8 pg (ref 26.0–34.0)
MCHC: 31.5 g/dL (ref 30.0–36.0)
MCV: 85.2 fL (ref 80.0–100.0)
Monocytes Absolute: 1.3 10*3/uL — ABNORMAL HIGH (ref 0.1–1.0)
Monocytes Relative: 7 %
Neutro Abs: 14.6 10*3/uL — ABNORMAL HIGH (ref 1.7–7.7)
Neutrophils Relative %: 83 %
Platelets: 279 10*3/uL (ref 150–400)
RBC: 3.99 MIL/uL — ABNORMAL LOW (ref 4.22–5.81)
RDW: 18.5 % — ABNORMAL HIGH (ref 11.5–15.5)
WBC: 17.8 10*3/uL — ABNORMAL HIGH (ref 4.0–10.5)
nRBC: 0 % (ref 0.0–0.2)

## 2023-09-04 LAB — HEPATITIS B SURFACE ANTIGEN: Hepatitis B Surface Ag: NONREACTIVE

## 2023-09-04 LAB — RENAL FUNCTION PANEL
Albumin: 2.7 g/dL — ABNORMAL LOW (ref 3.5–5.0)
Anion gap: 18 — ABNORMAL HIGH (ref 5–15)
BUN: 72 mg/dL — ABNORMAL HIGH (ref 6–20)
CO2: 22 mmol/L (ref 22–32)
Calcium: 9.3 mg/dL (ref 8.9–10.3)
Chloride: 95 mmol/L — ABNORMAL LOW (ref 98–111)
Creatinine, Ser: 14.74 mg/dL — ABNORMAL HIGH (ref 0.61–1.24)
GFR, Estimated: 4 mL/min — ABNORMAL LOW (ref >60)
Glucose, Bld: 179 mg/dL — ABNORMAL HIGH (ref 70–99)
Phosphorus: 5.4 mg/dL — ABNORMAL HIGH (ref 2.5–4.6)
Potassium: 3.8 mmol/L (ref 3.5–5.1)
Sodium: 135 mmol/L (ref 135–145)

## 2023-09-04 LAB — I-STAT CG4 LACTIC ACID, ED: Lactic Acid, Venous: 1.2 mmol/L (ref 0.5–1.9)

## 2023-09-04 LAB — URINALYSIS, W/ REFLEX TO CULTURE (INFECTION SUSPECTED)
Bilirubin Urine: NEGATIVE
Glucose, UA: NEGATIVE mg/dL
Ketones, ur: NEGATIVE mg/dL
Leukocytes,Ua: NEGATIVE
Nitrite: NEGATIVE
Protein, ur: 100 mg/dL — AB
Specific Gravity, Urine: 1.015 (ref 1.005–1.030)
pH: 5 (ref 5.0–8.0)

## 2023-09-04 LAB — HIV ANTIBODY (ROUTINE TESTING W REFLEX): HIV Screen 4th Generation wRfx: NONREACTIVE

## 2023-09-04 LAB — D-DIMER, QUANTITATIVE: D-Dimer, Quant: 0.99 ug{FEU}/mL — ABNORMAL HIGH (ref 0.00–0.50)

## 2023-09-04 LAB — GLUCOSE, CAPILLARY: Glucose-Capillary: 158 mg/dL — ABNORMAL HIGH (ref 70–99)

## 2023-09-04 LAB — HEMOGLOBIN A1C
Hgb A1c MFr Bld: 6.8 % — ABNORMAL HIGH (ref 4.8–5.6)
Mean Plasma Glucose: 148 mg/dL

## 2023-09-04 LAB — CBG MONITORING, ED
Glucose-Capillary: 171 mg/dL — ABNORMAL HIGH (ref 70–99)
Glucose-Capillary: 189 mg/dL — ABNORMAL HIGH (ref 70–99)

## 2023-09-04 LAB — MAGNESIUM: Magnesium: 2 mg/dL (ref 1.7–2.4)

## 2023-09-04 MED ORDER — PANTOPRAZOLE SODIUM 20 MG PO TBEC: 20.0000 mg | DELAYED_RELEASE_TABLET | Freq: Every day | ORAL | Status: DC | PRN

## 2023-09-04 MED ORDER — HYDROCODONE-ACETAMINOPHEN 5-325 MG PO TABS: 1.0000 | ORAL_TABLET | Freq: Once | ORAL | Status: DC

## 2023-09-04 MED ORDER — SEVELAMER CARBONATE 800 MG PO TABS
800.0000 mg | ORAL_TABLET | Freq: Every day | ORAL | Status: DC
  Administered 2023-09-04 – 2023-09-05 (×2): 800 mg via ORAL
  Filled 2023-09-04 (×2): qty 1

## 2023-09-04 MED ORDER — HEPARIN SODIUM (PORCINE) 5000 UNIT/ML IJ SOLN
5000.0000 [IU] | Freq: Three times a day (TID) | INTRAMUSCULAR | Status: DC
  Administered 2023-09-04 – 2023-09-05 (×2): 5000 [IU] via SUBCUTANEOUS
  Filled 2023-09-04 (×3): qty 1

## 2023-09-04 MED ORDER — VANCOMYCIN HCL IN DEXTROSE 1-5 GM/200ML-% IV SOLN
1000.0000 mg | INTRAVENOUS | Status: AC
  Administered 2023-09-04 (×2): 1000 mg via INTRAVENOUS
  Filled 2023-09-04 (×2): qty 200

## 2023-09-04 MED ORDER — SODIUM CHLORIDE 0.9 % IV SOLN
1.0000 g | Freq: Every day | INTRAVENOUS | Status: DC
  Administered 2023-09-05 – 2023-09-06 (×3): 1 g via INTRAVENOUS
  Filled 2023-09-04 (×6): qty 10

## 2023-09-04 MED ORDER — ATORVASTATIN CALCIUM 80 MG PO TABS
80.0000 mg | ORAL_TABLET | Freq: Every day | ORAL | Status: DC
  Administered 2023-09-04 – 2023-09-07 (×3): 80 mg via ORAL
  Filled 2023-09-04 (×4): qty 1

## 2023-09-04 MED ORDER — CARVEDILOL 25 MG PO TABS
50.0000 mg | ORAL_TABLET | Freq: Two times a day (BID) | ORAL | Status: DC
  Administered 2023-09-04 – 2023-09-06 (×7): 50 mg via ORAL
  Filled 2023-09-04: qty 4
  Filled 2023-09-04 (×4): qty 2
  Filled 2023-09-04 (×2): qty 4
  Filled 2023-09-04: qty 2

## 2023-09-04 MED ORDER — HYDRALAZINE HCL 50 MG PO TABS
50.0000 mg | ORAL_TABLET | Freq: Three times a day (TID) | ORAL | Status: DC
  Administered 2023-09-05 – 2023-09-07 (×7): 50 mg via ORAL
  Filled 2023-09-04 (×3): qty 1
  Filled 2023-09-04: qty 2
  Filled 2023-09-04 (×6): qty 1

## 2023-09-04 MED ORDER — INSULIN GLARGINE-YFGN 100 UNIT/ML ~~LOC~~ SOLN
10.0000 [IU] | Freq: Every day | SUBCUTANEOUS | Status: DC
  Administered 2023-09-05 (×2): 10 [IU] via SUBCUTANEOUS
  Filled 2023-09-04 (×5): qty 0.1

## 2023-09-04 MED ORDER — RENA-VITE PO TABS
1.0000 | ORAL_TABLET | Freq: Every day | ORAL | Status: DC
  Administered 2023-09-05 – 2023-09-06 (×3): 1 via ORAL
  Filled 2023-09-04 (×5): qty 1

## 2023-09-04 MED ORDER — ONDANSETRON HCL 4 MG/2ML IJ SOLN
4.0000 mg | Freq: Three times a day (TID) | INTRAMUSCULAR | Status: DC | PRN
  Administered 2023-09-04: 4 mg via INTRAVENOUS
  Filled 2023-09-04: qty 2

## 2023-09-04 MED ORDER — VANCOMYCIN HCL IN DEXTROSE 1-5 GM/200ML-% IV SOLN
1000.0000 mg | INTRAVENOUS | Status: DC
  Filled 2023-09-04: qty 200

## 2023-09-04 MED ORDER — SODIUM CHLORIDE 0.9 % IV SOLN
2.0000 g | Freq: Once | INTRAVENOUS | Status: DC
  Filled 2023-09-04: qty 20

## 2023-09-04 MED ORDER — HEPARIN SODIUM (PORCINE) 1000 UNIT/ML IJ SOLN
4600.0000 [IU] | Freq: Once | INTRAMUSCULAR | Status: DC
  Filled 2023-09-04: qty 5

## 2023-09-04 MED ORDER — INSULIN ASPART 100 UNIT/ML IJ SOLN
0.0000 [IU] | Freq: Three times a day (TID) | INTRAMUSCULAR | Status: DC
  Administered 2023-09-04 – 2023-09-06 (×5): 1 [IU] via SUBCUTANEOUS
  Administered 2023-09-07: 2 [IU] via SUBCUTANEOUS

## 2023-09-04 MED ORDER — ONDANSETRON HCL 4 MG/2ML IJ SOLN
4.0000 mg | Freq: Four times a day (QID) | INTRAMUSCULAR | Status: DC
  Administered 2023-09-04 – 2023-09-05 (×4): 4 mg via INTRAVENOUS
  Filled 2023-09-04 (×4): qty 2

## 2023-09-04 MED ORDER — CHLORHEXIDINE GLUCONATE CLOTH 2 % EX PADS
6.0000 | MEDICATED_PAD | Freq: Every day | CUTANEOUS | Status: DC
  Administered 2023-09-05: 6 via TOPICAL

## 2023-09-04 MED ORDER — METRONIDAZOLE 500 MG/100ML IV SOLN
500.0000 mg | Freq: Two times a day (BID) | INTRAVENOUS | Status: DC
  Administered 2023-09-04 – 2023-09-05 (×5): 500 mg via INTRAVENOUS
  Filled 2023-09-04 (×5): qty 100

## 2023-09-04 MED ORDER — INSULIN ASPART 100 UNIT/ML IJ SOLN
0.0000 [IU] | Freq: Every day | INTRAMUSCULAR | Status: DC
  Administered 2023-09-05: 2 [IU] via SUBCUTANEOUS
  Administered 2023-09-06: 3 [IU] via SUBCUTANEOUS

## 2023-09-04 MED ORDER — HYDROMORPHONE HCL 2 MG PO TABS
1.0000 mg | ORAL_TABLET | ORAL | Status: DC | PRN
  Administered 2023-09-05 – 2023-09-06 (×2): 1 mg via ORAL
  Filled 2023-09-04 (×2): qty 1

## 2023-09-04 MED ORDER — ACETAMINOPHEN 500 MG PO TABS: 1000.0000 mg | ORAL_TABLET | Freq: Four times a day (QID) | ORAL | Status: DC | PRN

## 2023-09-04 MED ORDER — ACETAMINOPHEN 500 MG PO TABS
1000.0000 mg | ORAL_TABLET | Freq: Four times a day (QID) | ORAL | Status: DC
  Administered 2023-09-04 – 2023-09-07 (×11): 1000 mg via ORAL
  Filled 2023-09-04 (×11): qty 2

## 2023-09-04 MED ORDER — SODIUM CHLORIDE 0.9 % IV SOLN
2.0000 g | Freq: Every day | INTRAVENOUS | Status: DC
  Administered 2023-09-04: 2 g via INTRAVENOUS
  Filled 2023-09-04: qty 12.5

## 2023-09-04 NOTE — Consult Note (Signed)
[facility]  INPATIENT CONSULTATION  Reason for Consultation: ESRD Requesting Provider: Dr. Ninetta Lights  HPI: Jeremy [name] is an 42 y.o. male with ESRD on HD TTS, CAD s/p CABG, h/o HFrEF, HTN, DM type 2 currently admitted for osteomyelitis and nephrology is consulted for ESRD co management.   Presented from podiatry office for management of L foot infection and concern for osteo after a recent toe amputation.  MRI confirmed osteo and a partial 3rd ray amputation has been recommended tomorrow.  He's currently on broad spectrum abx in the meantime.   His last HD was on 12/24.  He tolerates treatments fine.   Other than being emotional about amputation he feels ok currently.   PMH: Past Medical History:  Diagnosis Date   Abscess of left groin    Acute blood loss anemia 11/11/2013   Anemia in chronic kidney disease (CKD)    Asthma    Boil of scrotum 11/21/2015   Chest pain    a. 01/2015 Lexiscan MV: EF 28%, inferior, inferolateral, apical ischemia;  b. 01/2015 Cath: nl cors, PCWP 18 mmHg, CO 9.38 L/min, CI 3.53 L/min/m^2.   CHF (congestive heart failure) (HCC)    Depression    Situational   ESRD (end stage renal disease) (HCC)    [facility]- [place]   Essential hypertension    Family history of adverse reaction to anesthesia    sister- "it was too much for her heart" died   GERD (gastroesophageal reflux disease)    Hyperlipidemia    Membranous glomerulonephritis    Morbid obesity (HCC)    Nonischemic cardiomyopathy (HCC)    a. 01/2015 Echo: EF 20-25%, diff HK, Gr 2 DD, Triv AI, mildly dil LA and Ao root.   PONV (postoperative nausea and vomiting)    Seizures (HCC) 02/29/2020   "electralytes were out of wack."   Type II diabetes mellitus (HCC)    a. 01/2015 HbA1c = 8.9.   PSH: Past Surgical History:  Procedure Laterality Date   A/V FISTULAGRAM N/A 08/12/2021   Procedure: A/V FISTULAGRAM;  Surgeon: Maeola Harman, MD;  Location: [facility] INVASIVE CV LAB;   Service: Cardiovascular;  Laterality: N/A;   AV FISTULA PLACEMENT Right 12/18/2020   Procedure: RIGHT ARM RADIOCEPHALIC  ARTERIOVENOUS (AV) FISTULA CREATION;  Surgeon: Maeola Harman, MD;  Location: [facility] OR;  Service: Vascular;  Laterality: Right;   CARDIAC CATHETERIZATION N/A 02/01/2015   Procedure: Right/Left Heart Cath and Coronary Angiography;  Surgeon: Wendall Stade, MD;  Location: [facility] INVASIVE CV LAB;  Service: Cardiovascular;  Laterality: N/A;   IR FLUORO GUIDE CV LINE LEFT  07/21/2022   IR FLUORO GUIDE CV LINE LEFT  07/21/2022   IR FLUORO GUIDE CV LINE RIGHT  10/11/2019   IR REMOVAL TUN CV CATH W/O FL  07/17/2022   IR REMOVAL TUN CV CATH W/O FL  08/18/2022   IR US GUIDE VASC ACCESS LEFT  07/21/2022   IR US GUIDE VASC ACCESS LEFT  07/21/2022   IR US GUIDE VASC ACCESS RIGHT  10/11/2019   TEE WITHOUT CARDIOVERSION N/A 07/18/2022   Procedure: TRANSESOPHAGEAL ECHOCARDIOGRAM (TEE);  Surgeon: Maisie Fus, MD;  Location: [facility] ENDOSCOPY;  Service: Cardiovascular;  Laterality: N/A;   THORACOTOMY Left 11/08/2013   Procedure: LEFT THORACOTOMY;  Surgeon: Kerin Perna, MD;  Location: [facility] OR;  Service: Thoracic;  Laterality: Left;    Past Medical History:  Diagnosis Date   Abscess of left groin    Acute blood loss anemia 11/11/2013  Anemia in chronic kidney disease (CKD)    Asthma    Boil of scrotum 11/21/2015   Chest pain    a. 01/2015 Lexiscan MV: EF 28%, inferior, inferolateral, apical ischemia;  b. 01/2015 Cath: nl cors, PCWP 18 mmHg, CO 9.38 L/min, CI 3.53 L/min/m^2.   CHF (congestive heart failure) (HCC)    Depression    Situational   ESRD (end stage renal disease) (HCC)    [facility]- [place] [employer]   Essential hypertension    Family history of adverse reaction to anesthesia    sister- "it was too much for her heart" died   GERD (gastroesophageal reflux disease)    Hyperlipidemia    Membranous glomerulonephritis    Morbid obesity (HCC)    Nonischemic cardiomyopathy (HCC)    a.  01/2015 Echo: EF 20-25%, diff HK, Gr 2 DD, Triv AI, mildly dil LA and Ao root.   PONV (postoperative nausea and vomiting)    Seizures (HCC) 02/29/2020   "electralytes were out of wack."   Type II diabetes mellitus (HCC)    a. 01/2015 HbA1c = 8.9.    Medications:  I have reviewed the patient's current medications.  Medications Prior to Admission  Medication Sig Dispense Refill   acetaminophen (TYLENOL) 500 MG tablet Take 1,000 mg by mouth every 6 (six) hours as needed for moderate pain.     amLODipine (NORVASC) 10 MG tablet Take 1 tablet (10 mg total) by mouth daily. (Patient taking differently: Take 10 mg by mouth at bedtime.) 90 tablet 3   atorvastatin (LIPITOR) 80 MG tablet Take 1 tablet (80 mg total) by mouth daily. 90 tablet 3   carvedilol (COREG) 25 MG tablet Take 2 tablets (50 mg total) by mouth 2 (two) times daily. 120 tablet 11   cloNIDine (CATAPRES) 0.3 MG tablet Take 1 tablet (0.3 mg total) by mouth 2 (two) times daily as directed. 180 tablet 4   ferric citrate (AURYXIA) 1 GM 210 MG(Fe) tablet Take 2 tablets (420 mg total) by mouth with each meal and 2 tablets (420 mg total) with snacks. 300 tablet 11   glipiZIDE (GLUCOTROL) 5 MG tablet Take 1 tablet (5 mg total) by mouth daily before breakfast. 90 tablet 1   hydrALAZINE (APRESOLINE) 50 MG tablet Take 1 tablet (50 mg total) by mouth 3 (three) times daily. 270 tablet 3   insulin aspart (NOVOLOG FLEXPEN) 100 UNIT/ML FlexPen Inject 15 Units into the skin 3 (three) times daily with meals. (Patient taking differently: Inject 17 Units into the skin 3 (three) times daily with meals.) 9 mL 0   insulin glargine-yfgn (SEMGLEE, YFGN,) 100 UNIT/ML Pen Inject 20 Units into the skin at bedtime. 15 mL 11   multivitamin (RENA-VIT) TABS tablet Take 1 tablet by mouth daily.     nitroGLYCERIN (NITROSTAT) 0.4 MG SL tablet Place 1 tablet (0.4 mg total) under the tongue every 5 (five) minutes x 3 doses as needed within 15 minutes. If chest pain does not  resolve, seek medical attention. 25 tablet 6   ondansetron (ZOFRAN) 4 MG tablet Take 1 tablet by mouth once a day as needed (Patient taking differently: Take 4 mg by mouth 3 (three) times a week.) 90 tablet 3   oxyCODONE-acetaminophen (PERCOCET) 5-325 MG tablet Take 1 tablet by mouth every 4 (four) hours as needed for severe pain (pain score 7-10). 30 tablet 0   pantoprazole (PROTONIX) 20 MG tablet Take 1 tablet (20 mg total) by mouth daily as directed for reflux. 30 tablet 11  RENVELA 800 MG tablet Take 800 mg by mouth 3 (three) times daily with meals.      ALLERGIES:   Allergies  Allergen Reactions   Acyclovir And Related Hives   Imdur [Isosorbide Nitrate]     Headaches from nitrate    FAM HX: Family History  Problem Relation Age of Onset   Diabetes Mellitus II Mother        died @ 54.   Gastric cancer Mother    CAD Father        died @ 45.   Heart attack Father    Congestive Heart Failure Father    Diabetes Mellitus II Sister    CAD Sister        s/p PCI - age 33.    Social History:   reports that he has never smoked. He has never used smokeless tobacco. He reports current drug use. Drug: Marijuana. He reports that he does not drink alcohol.  ROS: 12 system ROS neg except per HPI   Blood pressure (!) 150/68, pulse 91, temperature 98.2 F (36.8 C), temperature source Oral, resp. rate 17, SpO2 100%. PHYSICAL EXAM: Gen: apparing well  Eyes: anicteric, EOMI ENT: MMM Neck: LIJ TDC c/d/i CV: RRR Abd: soft, nontender Lungs: normal WOB lying flat on RA Extr: L foot with some mild nonpitting edema and dressing Neuro: conversant, nonfocal    Results for orders placed or performed during the hospital encounter of 09/03/23 (from the past 48 hours)  Urinalysis, w/ Reflex to Culture (Infection Suspected) -Urine, Clean Catch     Status: Abnormal   Collection Time: 09/03/23  6:17 PM  Result Value Ref Range   Specimen Source URINE, CLEAN CATCH    Color, Urine YELLOW YELLOW    APPearance HAZY (A) CLEAR   Specific Gravity, Urine 1.015 1.005 - 1.030   pH 5.0 5.0 - 8.0   Glucose, UA NEGATIVE NEGATIVE mg/dL   Hgb urine dipstick SMALL (A) NEGATIVE   Bilirubin Urine NEGATIVE NEGATIVE   Ketones, ur NEGATIVE NEGATIVE mg/dL   Protein, ur 161 (A) NEGATIVE mg/dL   Nitrite NEGATIVE NEGATIVE   Leukocytes,Ua NEGATIVE NEGATIVE   RBC / HPF 0-5 0 - 5 RBC/hpf   WBC, UA 0-5 0 - 5 WBC/hpf    Comment:        Reflex urine culture not performed if WBC <=10, OR if Squamous epithelial cells >5. If Squamous epithelial cells >5 suggest recollection.    Bacteria, UA RARE (A) NONE SEEN   Squamous Epithelial / HPF 0-5 0 - 5 /HPF   Sperm, UA PRESENT     Comment: Performed at [facility], [address] [place], [place] [address]  Comprehensive metabolic panel     Status: Abnormal   Collection Time: 09/03/23  6:30 PM  Result Value Ref Range   Sodium 136 135 - 145 mmol/L   Potassium 4.0 3.5 - 5.1 mmol/L   Chloride 96 (L) 98 - 111 mmol/L   CO2 23 22 - 32 mmol/L   Glucose, Bld 173 (H) 70 - 99 mg/dL    Comment: Glucose reference range applies only to samples taken after fasting for at least 8 hours.   BUN 66 (H) 6 - 20 mg/dL   Creatinine, Ser 54.09 (H) 0.61 - 1.24 mg/dL   Calcium 9.5 8.9 - 81.1 mg/dL   Total Protein 8.0 6.5 - 8.1 g/dL   Albumin 3.0 (L) 3.5 - 5.0 g/dL   AST 18 15 - 41 U/L  ALT 15 0 - 44 U/L   Alkaline Phosphatase 45 38 - 126 U/L   Total Bilirubin 0.7 <1.2 mg/dL   GFR, Estimated 4 (L) >60 mL/min    Comment: (NOTE) Calculated using the CKD-EPI Creatinine Equation (2021)    Anion gap 17 (H) 5 - 15    Comment: Performed at [facility], [address], [place], [place] [address]  CBC with Differential     Status: Abnormal   Collection Time: 09/03/23  6:30 PM  Result Value Ref Range   WBC 18.7 (H) 4.0 - 10.5 K/uL   RBC 4.11 (L) 4.22 - 5.81 MIL/uL   Hemoglobin 11.1 (L) 13.0 - 17.0 g/dL   HCT 64.4 (L) 03.4 - 74.2 %   MCV 85.4 80.0 - 100.0 fL    MCH 27.0 26.0 - 34.0 pg   MCHC 31.6 30.0 - 36.0 g/dL   RDW 59.5 (H) 63.8 - 75.6 %   Platelets 288 150 - 400 K/uL   nRBC 0.0 0.0 - 0.2 %   Neutrophils Relative % 86 %   Neutro Abs 16.2 (H) 1.7 - 7.7 K/uL   Lymphocytes Relative 5 %   Lymphs Abs 1.0 0.7 - 4.0 K/uL   Monocytes Relative 7 %   Monocytes Absolute 1.2 (H) 0.1 - 1.0 K/uL   Eosinophils Relative 1 %   Eosinophils Absolute 0.2 0.0 - 0.5 K/uL   Basophils Relative 0 %   Basophils Absolute 0.1 0.0 - 0.1 K/uL   Immature Granulocytes 1 %   Abs Immature Granulocytes 0.10 (H) 0.00 - 0.07 K/uL    Comment: Performed at [facility], [address] [place], [place] [address]  I-Stat Lactic Acid, ED     Status: None   Collection Time: 09/03/23  6:37 PM  Result Value Ref Range   Lactic Acid, Venous 0.7 0.5 - 1.9 mmol/L  I-Stat Lactic Acid, ED     Status: None   Collection Time: 09/04/23  1:49 AM  Result Value Ref Range   Lactic Acid, Venous 1.2 0.5 - 1.9 mmol/L  HIV Antibody (routine testing w rflx)     Status: None   Collection Time: 09/04/23  2:32 AM  Result Value Ref Range   HIV Screen 4th Generation wRfx Non Reactive Non Reactive    Comment: Performed at [facility], [address] [place], [place] [address]  CBC with Differential/Platelet     Status: Abnormal   Collection Time: 09/04/23  2:32 AM  Result Value Ref Range   WBC 17.8 (H) 4.0 - 10.5 K/uL   RBC 3.99 (L) 4.22 - 5.81 MIL/uL   Hemoglobin 10.7 (L) 13.0 - 17.0 g/dL   HCT 16.6 (L) 06.3 - 01.6 %   MCV 85.2 80.0 - 100.0 fL   MCH 26.8 26.0 - 34.0 pg   MCHC 31.5 30.0 - 36.0 g/dL   RDW 01.0 (H) 93.2 - 35.5 %   Platelets 279 150 - 400 K/uL   nRBC 0.0 0.0 - 0.2 %   Neutrophils Relative % 83 %   Neutro Abs 14.6 (H) 1.7 - 7.7 K/uL   Lymphocytes Relative 8 %   Lymphs Abs 1.5 0.7 - 4.0 K/uL   Monocytes Relative 7 %   Monocytes Absolute 1.3 (H) 0.1 - 1.0 K/uL   Eosinophils Relative 2 %   Eosinophils Absolute 0.3 0.0 - 0.5 K/uL   Basophils Relative 0 %    Basophils Absolute 0.1 0.0 - 0.1 K/uL  Immature Granulocytes 0 %   Abs Immature Granulocytes 0.08 (H) 0.00 - 0.07 K/uL    Comment: Performed at [facility], [address] [place], [place] [address]  Renal function panel     Status: Abnormal   Collection Time: 09/04/23  2:32 AM  Result Value Ref Range   Sodium 135 135 - 145 mmol/L   Potassium 3.8 3.5 - 5.1 mmol/L   Chloride 95 (L) 98 - 111 mmol/L   CO2 22 22 - 32 mmol/L   Glucose, Bld 179 (H) 70 - 99 mg/dL    Comment: Glucose reference range applies only to samples taken after fasting for at least 8 hours.   BUN 72 (H) 6 - 20 mg/dL   Creatinine, Ser 19.14 (H) 0.61 - 1.24 mg/dL   Calcium 9.3 8.9 - 78.2 mg/dL   Phosphorus 5.4 (H) 2.5 - 4.6 mg/dL   Albumin 2.7 (L) 3.5 - 5.0 g/dL   GFR, Estimated 4 (L) >60 mL/min    Comment: (NOTE) Calculated using the CKD-EPI Creatinine Equation (2021)    Anion gap 18 (H) 5 - 15    Comment: Performed at [facility], [address] [place], [place] [address]  Magnesium     Status: None   Collection Time: 09/04/23  2:32 AM  Result Value Ref Range   Magnesium 2.0 1.7 - 2.4 mg/dL    Comment: Performed at [facility], [address] [place], [place] [address]  Culture, blood (Routine X 2) w Reflex to ID Panel     Status: None (Preliminary result)   Collection Time: 09/04/23  2:32 AM   Specimen: BLOOD  Result Value Ref Range   Specimen Description BLOOD RIGHT ANTECUBITAL    Special Requests      BOTTLES DRAWN AEROBIC AND ANAEROBIC Blood Culture adequate volume   Culture      NO GROWTH < 12 HOURS Performed at [facility], [address], [place], [place] [address]    Report Status PENDING   D-dimer, quantitative     Status: Abnormal   Collection Time: 09/04/23  2:32 AM  Result Value Ref Range   D-Dimer, Quant 0.99 (H) 0.00 - 0.50 ug/mL-FEU    Comment: (NOTE) At the manufacturer cut-off value of 0.5 g/mL FEU, this assay has a negative predictive value of  95-100%.This assay is intended for use in conjunction with a clinical pretest probability (PTP) assessment model to exclude pulmonary embolism (PE) and deep venous thrombosis (DVT) in outpatients suspected of PE or DVT. Results should be correlated with clinical presentation. Performed at [facility], [address], [place], [place] [address]   Culture, blood (Routine X 2) w Reflex to ID Panel     Status: None (Preliminary result)   Collection Time: 09/04/23  2:39 AM   Specimen: BLOOD LEFT HAND  Result Value Ref Range   Specimen Description BLOOD LEFT HAND    Special Requests      BOTTLES DRAWN AEROBIC AND ANAEROBIC Blood Culture adequate volume   Culture      NO GROWTH < 12 HOURS Performed at [facility], [address], [place], [place] [address]    Report Status PENDING   CBG monitoring, ED     Status: Abnormal   Collection Time: 09/04/23  8:46 AM  Result Value Ref Range   Glucose-Capillary 171 (H) 70 - 99 mg/dL    Comment: Glucose reference range applies only to samples taken  after fasting for at least 8 hours.  CBG monitoring, ED     Status: Abnormal   Collection Time: 09/04/23 12:08 PM  Result Value Ref Range   Glucose-Capillary 189 (H) 70 - 99 mg/dL    Comment: Glucose reference range applies only to samples taken after fasting for at least 8 hours.    VAS [place] LOWER EXTREMITY VENOUS (DVT) Result Date: 09/04/2023  Lower Venous DVT Study Patient Name:  Jeremy [name]  Date of Exam:   09/04/2023 Medical Rec #: [id number]        Accession #:    [id number] Date of Birth: Jan 16, 1981        Patient Gender: M Patient Age:   55 years Exam Location:  [facility] Procedure:      VAS [place] LOWER EXTREMITY VENOUS (DVT) Referring Phys: JEFFREY HATCHER --------------------------------------------------------------------------------  Indications: Swelling, and Edema.  Comparison Study: No prior exam. Performing Technologist: Fernande Bras  Examination Guidelines: A  complete evaluation includes B-mode imaging, spectral Doppler, color Doppler, and power Doppler as needed of all accessible portions of each vessel. Bilateral testing is considered an integral part of a complete examination. Limited examinations for reoccurring indications may be performed as noted. The reflux portion of the exam is performed with the patient in reverse Trendelenburg.  +-----+---------------+---------+-----------+----------+--------------+ RIGHTCompressibilityPhasicitySpontaneityPropertiesThrombus Aging +-----+---------------+---------+-----------+----------+--------------+ CFV  Full           Yes      Yes                                 +-----+---------------+---------+-----------+----------+--------------+ SFJ  Full           Yes      Yes                                 +-----+---------------+---------+-----------+----------+--------------+   +---------+---------------+---------+-----------+----------+--------------+ LEFT     CompressibilityPhasicitySpontaneityPropertiesThrombus Aging +---------+---------------+---------+-----------+----------+--------------+ CFV      Full           Yes      Yes                                 +---------+---------------+---------+-----------+----------+--------------+ SFJ      Full           Yes      Yes                                 +---------+---------------+---------+-----------+----------+--------------+ FV Prox  Full                                                        +---------+---------------+---------+-----------+----------+--------------+ FV Mid   Full                                                        +---------+---------------+---------+-----------+----------+--------------+ FV DistalFull                                                        +---------+---------------+---------+-----------+----------+--------------+  PFV      Full                                                         +---------+---------------+---------+-----------+----------+--------------+ POP      Full           Yes      Yes                                 +---------+---------------+---------+-----------+----------+--------------+ PTV      Full                                                        +---------+---------------+---------+-----------+----------+--------------+ PERO     Full                                                        +---------+---------------+---------+-----------+----------+--------------+ Lymph node in left groin measuring 4.5 x .8 x 2.9 cm.    Summary: RIGHT: - No evidence of common femoral vein obstruction.   LEFT: - There is no evidence of deep vein thrombosis in the lower extremity.  - No cystic structure found in the popliteal fossa.  *See table(s) above for measurements and observations. Electronically signed by Coral Else MD on 09/04/2023 at 11:21:11 AM.    Final    MR FOOT LEFT WO CONTRAST Result Date: 09/04/2023 CLINICAL DATA:  Dorsal foot redness and swelling. Plantar forefoot wound. EXAM: MRI OF THE LEFT FOOT WITHOUT CONTRAST TECHNIQUE: Multiplanar, multisequence MR imaging of the left foot was performed. No intravenous contrast was administered. COMPARISON:  Left foot x-rays from yesterday. FINDINGS: Bones/Joint/Cartilage Abnormal marrow edema involving the third metatarsal head, base of the third proximal phalanx, and medial base of the fourth proximal phalanx. No fracture or dislocation. Joint spaces are preserved. No significant joint effusion. Ligaments Collateral ligaments are intact.  Lisfranc ligament is intact. Muscles and Tendons Flexor and extensor tendons are intact. No tenosynovitis. Increased T2 signal within and atrophy of the intrinsic muscles of the forefoot, nonspecific, but likely related to diabetic muscle changes. Soft tissue Skin ulceration at the plantar aspect of the third metatarsal head (series 8, image 13). Underlying 3.4 x  1.2 x 5.3 cm (AP by transverse by CC) fluid collection extending from the ulceration to the dorsal skin surface via the third intermetatarsal space. Prominent dorsal foot soft tissue swelling extending into the base of the toes. No soft tissue mass. IMPRESSION: 1. Skin ulceration at the plantar aspect of the third metatarsal head with underlying 3.4 x 1.2 x 5.3 cm abscess in the third intermetatarsal space, extending from the ulceration to the dorsal skin surface. 2. Osteomyelitis of the third metatarsal head, base of the third proximal phalanx, and medial base of the fourth proximal phalanx. Electronically Signed   By: Obie Dredge M.D.   On: 09/04/2023 08:26   DG Chest 2 View Result Date: 09/03/2023 CLINICAL DATA:  Left foot swelling  with possible infection. EXAM: CHEST - 2 VIEW COMPARISON:  September 18, 2022 FINDINGS: There is stable left-sided venous catheter positioning. The heart size and mediastinal contours are within normal limits. Both lungs are clear. The visualized skeletal structures are unremarkable. IMPRESSION: No active cardiopulmonary disease. Electronically Signed   By: Aram Candela M.D.   On: 09/03/2023 19:17   DG Foot 2 Views Left Result Date: 09/03/2023 Please see detailed radiograph report in office note.   HD orders [facility] TTS  TDC 180 NR BFR 500 DFR 800 EDW 115 4hrs 2k/2ca Mircera 30 q2wks Hectorol 8 TIW  Assessment/Plan Jeremy [name] is an 42 y.o. male with ESRD on HD TTS, CAD s/p CABG, h/o HFrEF, HTN, DM type 2 currently admitted for osteomyelitis and nephrology is consulted for ESRD co management.   **L foot osteomyelitis: on abx with amputation scheduled 12/28.   **ESRD on HD: plan for HD today given missed tx yesterday and OR in Am.  Pt aware HD may be overnight.  Renal diet, fluid restriction.  Avoid morphine and baclofen.   **Anemia: Hb in 10s.    **BMM:  cont outpt binder and VDRA.    **HTN: BP reasonable on home meds, UF with HD.   **DM and  other issues per primary  WIll follow, reach out with concerns  Tyler Pita 09/04/2023, 2:12 PM

## 2023-09-04 NOTE — Progress Notes (Signed)
Summary: Jeremy [name] is a 42 yo patient with a history of CAD, CHF, T2DM complicated by nephropathy and neuropathy, ESRD on HD MWF, and recent R foot 4th toe amputation who presented to the ED from his podiatrist's office on 12/26 with L foot swelling and purulent discharge concerning for infection. He was admitted to the Internal Medicine Teaching Service on 12/27 for the management of cellulitis and osteomyelitis.   Subjective:  Patient states he feels nauseated, had one episode of emesis earlier this morning. States pain has improved to about 7 out of 10 from 10 out of 10 when he first arrived to the ED. Denies loose stools or other complaints. Discussed updates and plan for today, including assessment by podiatry and HD with nephrology.   Objective:  Vital signs in last 24 hours: Vitals:   09/04/23 0545 09/04/23 0556 09/04/23 0600 09/04/23 0953  BP: (!) 108/37 (!) 117/55 (!) 116/51 (!) 138/49  Pulse: 88  92 (!) 119  Resp: 17  20 17   Temp:   98.1 F (36.7 C) 98.2 F (36.8 C)  TempSrc:    Oral  SpO2: 96%  97% 92%      Latest Ref Rng & Units 09/04/2023    2:32 AM 09/03/2023    6:30 PM 02/18/2023    9:39 AM  CBC  WBC 4.0 - 10.5 K/uL 17.8  18.7  11.4   Hemoglobin 13.0 - 17.0 g/dL 03.4  74.2  59.5   Hematocrit 39.0 - 52.0 % 34.0  35.1  36.8   Platelets 150 - 400 K/uL 279  288  218        Latest Ref Rng & Units 09/04/2023    2:32 AM 09/03/2023    6:30 PM 02/18/2023    9:39 AM  BMP  Glucose 70 - 99 mg/dL 638  756  433   BUN 6 - 20 mg/dL 72  66  88   Creatinine 0.61 - 1.24 mg/dL 29.51  88.41  66.06   Sodium 135 - 145 mmol/L 135  136  136   Potassium 3.5 - 5.1 mmol/L 3.8  4.0  5.0   Chloride 98 - 111 mmol/L 95  96  101   CO2 22 - 32 mmol/L 22  23  18    Calcium 8.9 - 10.3 mg/dL 9.3  9.5  9.3     HIV screen: non-reactive  Blood culture (left hand): no growth < 12 hours  MRI Left Foot  IMPRESSION: 1. Skin ulceration at the plantar aspect of the third metatarsal head  with underlying 3.4 x 1.2 x 5.3 cm abscess in the third intermetatarsal space, extending from the ulceration to the dorsal skin surface. 2. Osteomyelitis of the third metatarsal head, base of the third proximal phalanx, and medial base of the fourth proximal phalanx.   Physical Exam Constitutional: Patient is resting comfortably in bed in no acute distress.  CV: Regular rate and rhythm without murmurs on auscultation. No LE edema.  Pulmonary/Respiratory: Normal respiratory effort on room air.  Abdominal: Soft, non-tender, non-distended.  MSK: Right foot bandage placed over R 4th toe that was previously amputated; stiches still in place, no overt bleeding, purulent discharge, or edema noted. Left foot dorsal surface erythematous and edematous, tender to palpation; plantar surface with ulcer, appears to have some purulent, serosanguinous discharge with pressure on palpation. (See media for images of foot from physical exam on admission).  Psych: Normal mood and affect.   Assessment/Plan:  Principal Problem:   Cellulitis  of foot Active Problems:   Type 2 diabetes mellitus with diabetic nephropathy (HCC)   ESRD on dialysis (HCC)   HF with recovered EF  Jeremy [name] is a 42 yo patient with a history of CAD, CHF, T2DM , ESRD on HD MWF, and recent R foot 4th toe amputation (12/02) who presented to the ED for L foot swelling and purulent discharge and was admitted to the Internal Medicine Teaching Service on 12/27 for the management of cellulitis and osteomyelitis.   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 MRI of left foot confirmed osteomyelitis of 3rd metatarsal head, base of the third proximal phalanx, and medial base of the fourth proximal phalanx, as well as an abscess in the third intermetatarsal space. Podiatry to take patient to OR for surgical management  on 12/28. Will be NPO at midnight. Pain regimen in, seems to be helping. Blood cultures without growth <12 hours, will continue broad IV antibiotic coverage for now.   D Dimer elevated on admission, VAS [place] LLE pending. ABIs from August 2024 normal, but will follow up VAS [place] given patient has been immobile in the past few days.  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 HD MWF with [facility], receives HD via tunneled dialysis catheter as RUE fistula did not mature. Per patient, catheter last changed about 1 year ago. Patient has been following HD holiday schedule, last HD on Tuesday 12/24. - Consulted nephrology for inpatient HD  - Resumed home Renvela 800 mg daily, MVA daily   HFrecEF Nonischemic cardiomyopathy HTN Last TTE in 2023 with recovered EF at 55-60% and GIIDD. Appears euvolemic on exam. On home carvedilol 50 mg BID, hydralazine 50 mg q8H, amlodipine 10 mg daily at bedtime, and clonidine 0.3 mg BID. Blood pressures here 100s-130s systolic/30s-50s diastolic. Will monitor BP and resume home antihypertensives as needed.  - Resumed carvedilol 50 mg BID with meals - Resumed hydralazine 50 mg q8H    T2DM Hgb A1c 8.4 07/16/2022. Home regimen includes  glipizide 5 mg daily, Semglee/glargine 20 units daily at bedtime, and Novolog/aspart 17 units TID with meals. Will hold home glypizide.  - Semglee/glargine 10 units daily at bedtime - Very sensitive SSI with HS coverage  Severe aortic insufficiency Ascending aorta dilatation Aortic  dilatation 40 mm noted on 2023 TTE and TEE for evaluation of infective endocarditis. At the time, cardiology and cardiothoracic surgery recommended surgical follow up 4 weeks after discharge but patient did not follow up. Patient was euvolemic on admission and morning exams today. Volume/fluid management with HD sessions. Will consider re-engaging cardiothoracic surgery during current admission if indicated, if not will highly encourage patient to follow up with cardiothoracic surgery at discharge.    Anemia of ESRD Stable. Hgb on admission 11.1. Holding home PO iron. Will monitor.    HLD Chronic. Resumed home atorvastatin 80 mg daily.   Diet: Renal diet, 1200 mL fluid restriction  VTE prophylaxis:  sq Heparin  Code Status: Full Code   Prior to Admission Living Arrangement: Home Anticipated Discharge Location: Home Barriers to Discharge: Pending surgical/medical management Dispo: Anticipated discharge in approximately 2-3 day(s).   Philomena Doheny, MD, PGY-1 09/04/2023, 9:56 AM Pager: [phone] After 5pm on weekdays and 1pm on weekends: On Call pager [phone]

## 2023-09-04 NOTE — Progress Notes (Signed)
Pharmacy Antibiotic Note  Jeremy [name] is a 42 y.o. male with ESRD on HD MWF admitted on 09/03/2023 with LLE cellulitis, possible osteomyelitis.  Pharmacy has been consulted for Vancomycin and Cefepime  dosing.  Plan: Vancomycin 2000 mg IV now, then 1000 mg IV after each HD Cefepime 2 g IV q24h    Temp (24hrs), Avg:98.7 F (37.1 C), Min:98.2 F (36.8 C), Max:99.6 F (37.6 C)  Recent Labs  Lab 09/03/23 1830 09/03/23 1837 09/04/23 0149 09/04/23 0232  WBC 18.7*  --   --  17.8*  CREATININE 14.94*  --   --  14.74*  LATICACIDVEN  --  0.7 1.2  --     CrCl cannot be calculated (Unknown ideal weight.).    Allergies  Allergen Reactions   Acyclovir And Related Hives   Imdur [Isosorbide Nitrate]     Headaches from nitrate    Eddie Candle 09/04/2023 3:13 AM

## 2023-09-04 NOTE — H&P (Addendum)
Date: 09/04/2023         Patient Name:  Jeremy [name] MRN: [id number]  DOB: 09/27/1980 Age / Sex: 42 y.o., male   PCP: Manuela Neptune, MD         Medical Service: Internal Medicine Teaching Service         Attending Physician: Dr. Ginnie Smart, MD    First Contact: Dr. Philomena Doheny, MD Pager [phone] Pager: [phone]  Second Contact: Dr. Olegario Messier, MD Pager [phone] Pager: [phone]       After Hours (After 5p/  First Contact Pager: [phone]  weekends / holidays): Second Contact Pager: [phone]   Chief Concern: Diabetic foot infection  History of Present Illness: Jeremy [name] is 42 year old male with a history of CAD, CHF and T2DM who presented to the ED from his podiatrist's office with L foot swelling and purulent drainage concerning for osteomyelitis.   Patient reported at his podiatrist office yesterday and was found to have edematous left forefoot into the midfoot with keratotic tissue that showed some localized drainage and odor upon debridement. Foot XR done at the office was negative for Osteomyelitis but couldn't be completely rule out, hence the need for follow up MRI and IV antibiotics. He is scheduled to see Dr Scarlette Ar of [facility] today while in-patient .   Patient reports complete adherence to his home medications.   Of notes, patient was seen for a  follow up at his podriatrist after amputation of  R 4th digit in the R foot on 12/2  for which he was initiated on doxycyline 100 mg BID. Patient endorsed good healing on R foot  but reported 10/10 pain on his L foot up to his distal shin, swelling compared to the R foot, and drainage from the calluses on the plantar surface of the L foot.Reports tylenol has not help with his intermittent foot pain. Denies fever, chills, chest pain or shortness of breath   Review of Systems  Constitutional:  Negative for chills and fever.  HENT: Negative.    Eyes: Negative.    Respiratory: Negative.    Cardiovascular:  Negative for chest pain, palpitations and orthopnea.  Gastrointestinal: Negative.   Genitourinary: Negative.   Musculoskeletal:        L lower extremity pain on the top of foot  Skin:  Positive for rash.  Neurological: Negative.   Endo/Heme/Allergies: Negative.   Psychiatric/Behavioral: Negative.      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   Aortic valve insufficiency 07/18/2022   Catheter-related bloodstream infection 07/16/2022   Anemia, unspecified 10/26/2019   Coagulation defect, unspecified (HCC) 10/21/2019   Secondary hyperparathyroidism of renal origin (HCC) 10/21/2019   Mass of soft tissue of face 09/23/2018   Onychomycosis due to dermatophyte 03/29/2018   Anemia, iron deficiency 07/18/2016   Anemia in chronic kidney disease 07/18/2016  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, stage 5 (HCC) 05/29/2016   Scrotal abscess 11/21/2015   Suspected sleep apnea 05/24/2015   Chronic combined systolic and diastolic CHF (congestive heart failure) (HCC) 03/01/2015   Nonischemic cardiomyopathy (HCC)    Asthma    Hyperlipidemia 03/31/2012   Obesity, Class I, BMI 30-34.9 03/21/2011   Primary hypertension 03/21/2011   Gastroesophageal reflux disease 03/21/2011   Type 2 diabetes mellitus with diabetic nephropathy (HCC)  02/20/2011   Past Medical History:  Diagnosis Date   Abscess of left groin    Acute blood loss anemia 11/11/2013   Anemia in chronic kidney disease (CKD)    Asthma    Boil of scrotum 11/21/2015   Chest pain    a. 01/2015 Lexiscan MV: EF 28%, inferior, inferolateral, apical ischemia;  b. 01/2015 Cath: nl cors, PCWP 18 mmHg, CO 9.38 L/min, CI 3.53 L/min/m^2.   CHF (congestive heart failure) (HCC)    Depression    Situational   ESRD (end stage renal disease) (HCC)    TTHS- [place]   Essential hypertension    Family history of adverse reaction to anesthesia    sister- "it was too much for her heart" died   GERD (gastroesophageal reflux disease)    Hyperlipidemia    Membranous glomerulonephritis    Morbid obesity (HCC)    Nonischemic cardiomyopathy (HCC)    a. 01/2015 Echo: EF 20-25%, diff HK, Gr 2 DD, Triv AI, mildly dil LA and Ao root.   PONV (postoperative nausea and vomiting)    Seizures (HCC) 02/29/2020   "electralytes were out of wack."   Type II diabetes mellitus (HCC)    a. 01/2015 HbA1c = 8.9.    Medications Acetaminophen 500 mg   Percocet 5-325 mg  Amlodipine 10 mg  Coreg 25 mg Clonidine 0.3 Hydralazine 50 mg  Lipitor 80 mg  Glipizide 5 mg Semglee 20units Protonix 40 mg Zofran 4 mg  Renvella 800 mg   Surgical History: Past Surgical History:  Procedure Laterality Date   A/V FISTULAGRAM N/A 08/12/2021   Procedure: A/V FISTULAGRAM;  Surgeon: Maeola Harman, MD;  Location[facility] INVASIVE CV LAB;  Service: Cardiovascular;  Laterality: N/A;   AV FISTULA PLACEMENT Right 12/18/2020   Procedure: RIGHT ARM RADIOCEPHALIC  ARTERIOVENOUS (AV) FISTULA CREATION;  Surgeon: Maeola Harman, MD;  Location: [facility] OR;  Service: Vascular;  Laterality: Right;   CARDIAC CATHETERIZATION N/A 02/01/2015   Procedure: Right/Left Heart Cath and Coronary Angiography;  Surgeon: Wendall Stade, MD;  Location: [facility] INVASIVE CV LAB;  Service: Cardiovascular;  Laterality: N/A;    IR FLUORO GUIDE CV LINE LEFT  07/21/2022   IR FLUORO GUIDE CV LINE LEFT  07/21/2022   IR FLUORO GUIDE CV LINE RIGHT  10/11/2019   IR REMOVAL TUN CV CATH W/O FL  07/17/2022   IR REMOVAL TUN CV CATH W/O FL  08/18/2022   IR US GUIDE VASC ACCESS LEFT  07/21/2022   IR US GUIDE VASC ACCESS LEFT  07/21/2022   IR US GUIDE VASC ACCESS RIGHT  10/11/2019   TEE WITHOUT CARDIOVERSION N/A 07/18/2022   Procedure: TRANSESOPHAGEAL ECHOCARDIOGRAM (TEE);  Surgeon: Maisie Fus, MD;  Location: [facility] ENDOSCOPY;  Service: Cardiovascular;  Laterality: N/A;   THORACOTOMY Left 11/08/2013   Procedure: LEFT THORACOTOMY;  Surgeon: Kerin Perna, MD;  Location: [facility] OR;  Service: Thoracic;  Laterality: Left;     Family History:  Family History  Problem Relation Age of Onset  Diabetes Mellitus II Mother        died @ 55.   Gastric cancer Mother    CAD Father        died @ 49.   Heart attack Father    Congestive Heart Failure Father    Diabetes Mellitus II Sister    CAD Sister        s/p PCI - age 6.     Social History:  Lives with his girlfirend and has good social support from family and friends. 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 Denies alcohol or any other substances Surrogate decision maker: Sister Gershon Cull Younger  Physical Exam: Blood pressure (!) 120/50, pulse 89, temperature 98.4 F (36.9 C), resp. rate 17, SpO2 100%.  Physical Exam:  Constitutional: well-appearing laying in bed in mild distress HENT: normocephalic atraumatic, mucous membranes moist Cardiovascular: regular rate and rhythm Pulmonary/Chest: normal work of breathing on room air, lungs clear to auscultation bilaterally. No crackles  Abdominal: soft, non-tender, non-distended.  Neurological: alert & oriented x 3 MSK: Superior left chest with tunneled dialysis catheter without erythema. Left foot with pitting edema on the dorsum of the L foot with  overlying tender erythema. Probe to deep bone with purulent drainage and without pain. No crepitus.  Skin: warm and dry Psych: Normal mood and affect  LEFT FOOT PLANT SURFACE     L FOOT DORSAL, LATERAL   CBC    Component Value Date/Time   WBC 18.7 (H) 09/03/2023 1830   RBC 4.11 (L) 09/03/2023 1830   HGB 11.1 (L) 09/03/2023 1830   HCT 35.1 (L) 09/03/2023 1830   HCT 23.7 (L) 05/30/2016 1157   PLT 288 09/03/2023 1830   MCV 85.4 09/03/2023 1830   MCH 27.0 09/03/2023 1830   MCHC 31.6 09/03/2023 1830   RDW 18.5 (H) 09/03/2023 1830   LYMPHSABS 1.0 09/03/2023 1830   MONOABS 1.2 (H) 09/03/2023 1830   EOSABS 0.2 09/03/2023 1830   BASOSABS 0.1 09/03/2023 1830     CMP     Component Value Date/Time   NA 136 09/03/2023 1830   NA 141 10/15/2020 1015   K 4.0 09/03/2023 1830   CL 96 (L) 09/03/2023 1830   CO2 23 09/03/2023 1830   GLUCOSE 173 (H) 09/03/2023 1830   BUN 66 (H) 09/03/2023 1830   BUN 62 (H) 10/15/2020 1015   CREATININE 14.94 (H) 09/03/2023 1830   CALCIUM 9.5 09/03/2023 1830   PROT 8.0 09/03/2023 1830   ALBUMIN 3.0 (L) 09/03/2023 1830   AST 18 09/03/2023 1830   ALT 15 09/03/2023 1830   ALKPHOS 45 09/03/2023 1830   BILITOT 0.7 09/03/2023 1830   GFRNONAA 4 (L) 09/03/2023 1830   GFRAA 9 (L) 10/15/2020 1015    Imaging: DG Chest 2 View Result Date: 09/03/2023 CLINICAL DATA:  Left foot swelling with possible infection. EXAM: CHEST - 2 VIEW COMPARISON:  September 18, 2022 FINDINGS: There is stable left-sided venous catheter positioning. The heart size and mediastinal contours are within normal limits. Both lungs are clear. The visualized skeletal structures are unremarkable. IMPRESSION: No active cardiopulmonary disease. Electronically Signed   By: Aram Candela M.D.   On: 09/03/2023 19:17   DG Foot 2 Views Left Result Date: 09/03/2023 Please see detailed radiograph report in office note.     EKG:  Sinus rhythm with PACs on Telemetry  EKG sinus rhythm unchanged  from prior  Assessment &  Plan:  Jeremy [name] is a 42 y.o. male with a history of CAD, CHF and T2DM who presented with left foot edema, found to have pus draining from a callus on the planter surface of his left foot and admitted for cellulitis of the foot and osteomyelitis rule out.   Principal Problem:   Cellulitis of foot  #Cellulitis of the left foot  #Concern for osteomyelitis  Presented with a evidence of swollen erythematous left foot with drainage from a callus concerning for cellulitis . See PE above for images. WBC of 18.7 on arrival, however denies any fever or chills and not hypotensive. Left foot XR showed no sign of osteomyelitis but can't be ruled out completely. Will get an MRI to definitely rule out osteomyelitis. WELLS score of 0. Podiatry will see in the morning. - Left foot MRI wo contrast - Start Vancomycin, Cefepime, and flagyl - Follow blood cultures - Follow up [place] - if elevated, VAS [place] LLE - Pharmacy assistance appreciated  - Dr. 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 Follows up with [facility] on HD MWF through tunneled dialysis catheter as RUE fistula did not mature. Patient has been following the holiday schedule, last HD on Tuesday 12/24 - Consult Nephrology in the AM for HD on 12/27 - Restarted MBD meds  #HFrecEF #Nonischemic cardiomyopathy #HTN Last TTE in 2023 with recovered EF at 55-60% and GIIDD. Euvolemic on exam. - Continued Coreg and hydralazine - Holding other antihypertensives - Monitor VS  #T2DM Last A1c 8.4 a year ago. On glipizide, Semglee 20 units daily -Holding home glypizide -Restart LA insulin  #Severe aortic insufficiency #Ascending aorta dilatation Aortic dilatation 40 mm noted on 2023 TTE and TEE for evaluation  of infective endocarditis. At the time, cardiology and cardiothoracic surgery recommended surgery ~4 weeks after that hospitalization. Patient has been lost to follow up. Patient is euvolemic on exam today and fluid is managed by HD. - Re-engage cardiothoracic surgery at discharge or during this admission if clinically appropriate  #Anemia of ESRD -Stable -Holding home PO iron  #HLD -Restarted home statin  Barriers to Discharge: medical work up and treatment   Dispo: Admit patient to Inpatient with expected length of stay greater than 2 midnights.  Kathleen Lime , MD  Internal Medicine Resident PGY-1 09/04/2023, 2:21 AM   Please contact the on call pager after 5 pm and on weekends at [phone].

## 2023-09-04 NOTE — ED Notes (Signed)
Calld CCMD to place patient on monitor

## 2023-09-04 NOTE — Hospital Course (Addendum)
Jeremy [name] is a 42 yo patient with a history of CAD, CHF, T2DM , ESRD on HD MWF, and recent R foot 4th toe amputation (12/02) who presented to the ED for L foot swelling and purulent discharge and was admitted to the Internal Medicine Teaching Service on 12/27 for the management of cellulitis and osteomyelitis.    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  He was admitted for osteomyelitis and cellulitis.  He was cared for by [place] and by Dr. Scarlette Ar of [facility].  Due to his osteomyelitis he required 3rd ray amputation and 4th prox phalanx amputation on 12/28.  Responded well to surgery and to antibiotics. Initial antibiotic regimen was vancomycin and cefepime and then transition to Augmentin after op cultures grew Proteus species that was pan susceptible. Pain controlled with Tylenol and oxycodone, Tylenol was sufficient at end of hospital course. Blood cultures without growth.  He will be discharged on Augmentin completed 10-day course of total antibiotics.  He also be discharged with bilateral surgical shoes and crutches and he has been instructed not to bear weight on the left foot until cleared by podiatry.  Will be discharged home with wound care instructions which will be provided primarily by his girlfriend who is a Engineer, civil (consulting).  Unfortunately we were unable to arrange home health wound care for him.   New onset post-op Afib with RVR Pt is in Afib/RVR after his surgical procedures 12/28. HR as high as 140, blood pressure stable, asymptomatic. In PACU metoprolol 2g x2 was tried without improvement, then diltiazem 20 mg boluses x 2 alongside diltiazem drip did not stop A-fib.  He was then transition to amiodarone with a loading dose of 150 mg and then maintenance 60 mg/h and 30/h at that enable him to convert. He does  not have a history of afib. He does have a history of HFrecEF and takes coreg at home.  Midodrine was stopped and he did not return to A-fib for 36 hours through discharge.  At this point no further indication for rate or rhythm control medicines or anticoagulation.  Favor operation as cause of his A-fib.    R foot wound with dehiscence  He is s/p 4th toe amputation 12/2. Wound margins are somewhat displaced. No overt pain, bleeding, draining.  There is to an extent and odor from the foot.  Will include Augmentin as part of his treatment regimen dose to cover for any possible infection.  Recommend close follow-up for the right foot as well as the left in the setting.  Care with Betadine provided.    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 HD MWF with [facility], receives HD via tunneled dialysis catheter as RUE fistula did not mature. Per patient, catheter last changed about 1 year ago.  - Nephrology facilitated inpatient HD    T2DM - Semglee/glargine 10 units daily at bedtime - Very sensitive SSI with HS coverage

## 2023-09-04 NOTE — ED Provider Notes (Signed)
[facility]-EMER[facility]ENCY DEPT [facility] Emergency Department Provider Note MRN:  [id number]  Arrival date & time: 09/04/23     Chief Complaint   Wound Check   History of Present Illness   Jeremy [name] is a 42 y.o. year-old male with a history of CHF, CAD, diabetes presenting to the ED with chief complaint of wound check.  Increased pain swelling and redness to the left foot over the past 2 or 3 days.  Has an ulcer on the bottom of the foot.  Went to podiatry earlier today and had it opened and drained.  Advised to come to the emergency department for admission for IV antibiotics by podiatrist.  Denies fever.  Review of Systems  A thorough review of systems was obtained and all systems are negative except as noted in the HPI and PMH.   Patient's Health History    Past Medical History:  Diagnosis Date   Abscess of left groin    Acute blood loss anemia 11/11/2013   Anemia in chronic kidney disease (CKD)    Asthma    Boil of scrotum 11/21/2015   Chest pain    a. 01/2015 Lexiscan MV: EF 28%, inferior, inferolateral, apical ischemia;  b. 01/2015 Cath: nl cors, PCWP 18 mmHg, CO 9.38 L/min, CI 3.53 L/min/m^2.   CHF (congestive heart failure) (HCC)    Depression    Situational   ESRD (end stage renal disease) (HCC)    TTHS- [place]   Essential hypertension    Family history of adverse reaction to anesthesia    sister- "it was too much for her heart" died   GERD (gastroesophageal reflux disease)    Hyperlipidemia    Membranous glomerulonephritis    Morbid obesity (HCC)    Nonischemic cardiomyopathy (HCC)    a. 01/2015 Echo: EF 20-25%, diff HK, Gr 2 DD, Triv AI, mildly dil LA and Ao root.   PONV (postoperative nausea and vomiting)    Seizures (HCC) 02/29/2020   "electralytes were out of wack."   Type II diabetes mellitus (HCC)    a. 01/2015 HbA1c = 8.9.    Past Surgical History:  Procedure Laterality Date   A/V FISTULAGRAM N/A 08/12/2021   Procedure: A/V FISTULAGRAM;   Surgeon: Maeola Harman, MD;  Location: [facility] INVASIVE CV LAB;  Service: Cardiovascular;  Laterality: N/A;   AV FISTULA PLACEMENT Right 12/18/2020   Procedure: RIGHT ARM RADIOCEPHALIC  ARTERIOVENOUS (AV) FISTULA CREATION;  Surgeon: Maeola Harman, MD;  Location: [facility] OR;  Service: Vascular;  Laterality: Right;   CARDIAC CATHETERIZATION N/A 02/01/2015   Procedure: Right/Left Heart Cath and Coronary Angiography;  Surgeon: Wendall Stade, MD;  Location: [facility] INVASIVE CV LAB;  Service: Cardiovascular;  Laterality: N/A;   IR FLUORO GUIDE CV LINE LEFT  07/21/2022   IR FLUORO GUIDE CV LINE LEFT  07/21/2022   IR FLUORO GUIDE CV LINE RIGHT  10/11/2019   IR REMOVAL TUN CV CATH W/O FL  07/17/2022   IR REMOVAL TUN CV CATH W/O FL  08/18/2022   IR US GUIDE VASC ACCESS LEFT  07/21/2022   IR US GUIDE VASC ACCESS LEFT  07/21/2022   IR US GUIDE VASC ACCESS RIGHT  10/11/2019   TEE WITHOUT CARDIOVERSION N/A 07/18/2022   Procedure: TRANSESOPHAGEAL ECHOCARDIOGRAM (TEE);  Surgeon: Maisie Fus, MD;  Location: [facility] ENDOSCOPY;  Service: Cardiovascular;  Laterality: N/A;   THORACOTOMY Left 11/08/2013   Procedure: LEFT THORACOTOMY;  Surgeon: Kerin Perna, MD;  Location: [facility] OR;  Service: Thoracic;  Laterality: Left;    Family History  Problem Relation Age of Onset   Diabetes Mellitus II Mother        died @ 78.   Gastric cancer Mother    CAD Father        died @ 68.   Heart attack Father    Congestive Heart Failure Father    Diabetes Mellitus II Sister    CAD Sister        s/p PCI - age 26.    Social History   Socioeconomic History   Marital status: Single    Spouse name: Not on file   Number of children: Not on file   Years of education: Not on file   Highest education level: Some college, no degree  Occupational History   Not on file  Tobacco Use   Smoking status: Never   Smokeless tobacco: Never  Vaping Use   Vaping status: Never Used  Substance and Sexual Activity   Alcohol use: No     Alcohol/week: 0.0 standard drinks of alcohol   Drug use: Yes    Types: Marijuana    Comment: last used 09/15/2020   Sexual activity: Yes    Partners: Female  Other Topics Concern   Not on file  Social History Narrative   Lives in [place] by himself.  Currently in [facility].   Caffeine- none       Social Drivers of Corporate investment banker Strain: Not on file  Food Insecurity: No Food Insecurity (10/01/2022)   Hunger Vital Sign    Worried About Running Out of Food in the Last Year: Never true    Ran Out of Food in the Last Year: Never true  Transportation Needs: No Transportation Needs (02/19/2023)   PRAPARE - Administrator, Civil Service (Medical): No    Lack of Transportation (Non-Medical): No  Physical Activity: Insufficiently Active (11/27/2021)   Exercise Vital Sign    Days of Exercise per Week: 2 days    Minutes of Exercise per Session: 30 min  Stress: Stress Concern Present (11/27/2021)   [employer] of Occupational Health - Occupational Stress Questionnaire    Feeling of Stress : To some extent  Social Connections: Unknown (02/17/2023)   Received from [facility]   Social Network    Social Network: Not on file  Intimate Partner Violence: Unknown (02/17/2023)   Received from [facility]   HITS    Physically Hurt: Not on file    Insult or Talk Down To: Not on file    Threaten Physical Harm: Not on file    Scream or Curse: Not on file     Physical Exam   Vitals:   09/03/23 1806 09/03/23 2151  BP: (!) 161/62 (!) 120/50  Pulse: (!) 103 89  Resp: 20 17  Temp: 99.6 F (37.6 C) 98.4 F (36.9 C)  SpO2: 99% 100%    CONSTITUTIONAL: Well-appearing, NAD NEURO/PSYCH:  Alert and oriented x 3, no focal deficits EYES:  eyes equal and reactive ENT/NECK:  no LAD, no JVD CARDIO: Regular rate, well-perfused, normal S1 and S2 PULM:  CTAB no wheezing or rhonchi GI/GU:  non-distended, non-tender MSK/SPINE:  No gross  deformities, no edema SKIN: Swollen and erythematous left foot with plantar ulcer draining red/milky fluid   *Additional and/or pertinent findings included in MDM below  Diagnostic and Interventional Summary    EKG Interpretation Date/Time:    Ventricular Rate:  PR Interval:    QRS Duration:    QT Interval:    QTC Calculation:   R Axis:      Text Interpretation:         Labs Reviewed  COMPREHENSIVE METABOLIC PANEL - Abnormal; Notable for the following components:      Result Value   Chloride 96 (*)    Glucose, Bld 173 (*)    BUN 66 (*)    Creatinine, Ser 14.94 (*)    Albumin 3.0 (*)    GFR, Estimated 4 (*)    Anion gap 17 (*)    All other components within normal limits  CBC WITH DIFFERENTIAL/PLATELET - Abnormal; Notable for the following components:   WBC 18.7 (*)    RBC 4.11 (*)    Hemoglobin 11.1 (*)    HCT 35.1 (*)    RDW 18.5 (*)    Neutro Abs 16.2 (*)    Monocytes Absolute 1.2 (*)    Abs Immature Granulocytes 0.10 (*)    All other components within normal limits  URINALYSIS, W/ REFLEX TO CULTURE (INFECTION SUSPECTED)  I-STAT CG4 LACTIC ACID, ED  I-STAT CG4 LACTIC ACID, ED    DG Chest 2 View  Final Result      Medications  cefTRIAXone (ROCEPHIN) 2 g in sodium chloride 0.9 % 100 mL IVPB (has no administration in time range)     Procedures  /  Critical Care Procedures  ED Course and Medical Decision Making  Initial Impression and Ddx See clinical media image for further details.  Diabetic foot infection with ulcer draining purulent material, redness seems to be spreading to the plantar and dorsal surface of the foot.  Plain film in the office with podiatry today was unremarkable.  Podiatry requesting admission, IV antibiotics, possible MRI while inpatient.  Past medical/surgical history that increases complexity of ED encounter: Diabetes, ESRD, CHF  Interpretation of Diagnostics I personally reviewed the laboratory assessment and my interpretation  is as follows: Leukocytosis    Patient Reassessment and Ultimate Disposition/Management     Plan is for admission.  Patient management required discussion with the following services or consulting groups:  Hospitalist Service  Complexity of Problems Addressed Acute illness or injury that poses threat of life of bodily function  Additional Data Reviewed and Analyzed Further history obtained from: Further history from spouse/family member  Additional Factors Impacting ED Encounter Risk Consideration of hospitalization  Elmer Sow. Pilar Plate, MD [facility] Emergency Medicine [facility] [email or web address]  Final Clinical Impressions(s) / ED Diagnoses     ICD-10-CM   1. Diabetic foot infection [facility])  E11.628    L08.9       ED Discharge Orders     None        Discharge Instructions Discussed with and Provided to Patient:   Discharge Instructions   None      Sabas Sous, MD 09/04/23 0110

## 2023-09-04 NOTE — ED Notes (Signed)
Patients wound on left foot rewrapped.

## 2023-09-04 NOTE — ED Notes (Signed)
Patient transported to MRI 

## 2023-09-04 NOTE — Consult Note (Signed)
PODIATRY CONSULTATION  NAME Jeremy [name] MRN [id number] DOB 1980/11/24 DOA 09/03/2023   Reason for consult:  Chief Complaint  Patient presents with   Wound Check    Attending/Consulting physician: Jolayne Haines MD  History of present illness: "Jeremy [name] is 42 year old male with a history of CAD, CHF and T2DM who presented to the ED from his podiatrist's office with L foot swelling and purulent drainage concerning for osteomyelitis.    Patient reported at his podiatrist office yesterday and was found to have edematous left forefoot into the midfoot with keratotic tissue that showed some localized drainage and odor upon debridement. Foot XR done at the office was negative for Osteomyelitis but couldn't be completely rule out, hence the need for follow up MRI and IV antibiotics."  We discussed the findings of the MRI in detail and discussed that I recommend incision and drainage with partial third ray amputation meaning amputation of the third toe and some of the bone behind it as well as a portion of the fourth toe however will attempt to leave the fourth toe intact and just take some bone out rather than amputate the toe at this time.  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 01/2015 Lexiscan MV: EF 28%, inferior, inferolateral, apical ischemia;  b. 01/2015 Cath: nl cors, PCWP 18 mmHg, CO 9.38 L/min, CI 3.53 L/min/m^2.   CHF (congestive heart failure) (HCC)    Depression    Situational   ESRD (end stage renal disease) (HCC)    TTHS- [place]    Essential hypertension    Family history of adverse reaction to anesthesia    sister- "it was too much for her heart" died   GERD (gastroesophageal reflux disease)    Hyperlipidemia    Membranous glomerulonephritis    Morbid obesity (HCC)    Nonischemic  cardiomyopathy (HCC)    a. 01/2015 Echo: EF 20-25%, diff HK, Gr 2 DD, Triv AI, mildly dil LA and Ao root.   PONV (postoperative nausea and vomiting)    Seizures (HCC) 02/29/2020   "electralytes were out of wack."   Type II diabetes mellitus (HCC)    a. 01/2015 HbA1c = 8.9.       Latest Ref Rng & Units 09/04/2023    2:32 AM 09/03/2023    6:30 PM 02/18/2023    9:39 AM  CBC  WBC 4.0 - 10.5 K/uL 17.8  18.7  11.4   Hemoglobin 13.0 - 17.0 g/dL 69.6  29.5  28.4   Hematocrit 39.0 - 52.0 % 34.0  35.1  36.8   Platelets 150 - 400 K/uL 279  288  218        Latest Ref Rng & Units 09/04/2023    2:32 AM 09/03/2023    6:30 PM 02/18/2023    9:39 AM  BMP  Glucose 70 - 99 mg/dL 132  440  102   BUN 6 - 20 mg/dL 72  66  88   Creatinine 0.61 - 1.24 mg/dL 72.53  66.44  03.47   Sodium 135 - 145 mmol/L 135  136  136   Potassium 3.5 - 5.1 mmol/L 3.8  4.0  5.0   Chloride 98 - 111 mmol/L 95  96  101   CO2 22 -  32 mmol/L 22  23  18    Calcium 8.9 - 10.3 mg/dL 9.3  9.5  9.3       Physical Exam: Lower Extremity Exam Vasc: R - PT palpable, DP palpable. Cap refill < 3 sec to digits  L - PT palpable, DP palpable. Cap refill <3 sec to digits  Derm: R -no dehiscence or erythema at the right fourth toe amputation site continue to monitor  L -edema and erythema of the left foot improved from yesterday however still with significant edema and purulent drainage from plantar wound underlying the third metatarsal head.  Does probe deep  MSK:  R -prior right fourth toe amputation healing with sutures intact  L -significant swelling of the left foot  Neuro: R - Gross sensation absent. Gross motor function intact   L - Gross sensation absent. Gross motor function intact    ASSESSMENT/PLAN OF CARE 42 y.o. 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  WBC 17.8 MRI left foot.  Findings: Ulceration plantar aspect third metatarsal head with 3.4 x 1.2 x 5.3 cm abscess in the third intermetatarsal space, osteomyelitis of the third metatarsal head base of the third proximal phalanx and medial base of the fourth proximal phalanx  -N.p.o. past midnight for OR tomorrow for left foot third ray amputation and base of proximal phalanx of fourth toe resection.  Discussed with the patient and he is in agreement to proceed. - Continue IV abx broad spectrum pending further culture data - Anticoagulation: Hold pending OR - Wound care: None needed preop leave dressing in tact - WB status: Weightbearing as tolerated to the left foot preop - Will continue to follow   Thank you for the consult.  Please contact me directly with any questions or concerns.           Corinna Gab, DPM [facility]    [address] [place], [place] [address]                Office [phone]  Fax [phone]

## 2023-09-04 NOTE — Progress Notes (Signed)
Left lower extremity venous duplex has been completed.  Results can be found in chart review under CV Proc.  09/04/2023 10:38 AM  Abdulmalik Darco Durenda Age, RVT.

## 2023-09-04 NOTE — ED Notes (Signed)
Vascular Tech at bedside.  

## 2023-09-04 NOTE — ED Notes (Signed)
Pt is resting no complaints at this time.

## 2023-09-05 ENCOUNTER — Other Ambulatory Visit: Payer: Self-pay

## 2023-09-05 ENCOUNTER — Inpatient Hospital Stay (HOSPITAL_COMMUNITY): Payer: Medicaid Other | Admitting: Certified Registered Nurse Anesthetist

## 2023-09-05 ENCOUNTER — Encounter (HOSPITAL_COMMUNITY)
Admission: EM | Disposition: A | Discharge status: Home / Self Care | Payer: Self-pay | Source: Home / Self Care | Attending: Internal Medicine

## 2023-09-05 ENCOUNTER — Encounter (HOSPITAL_COMMUNITY): Payer: Self-pay | Admitting: Infectious Diseases

## 2023-09-05 ENCOUNTER — Inpatient Hospital Stay (HOSPITAL_COMMUNITY): Payer: Medicaid Other

## 2023-09-05 DIAGNOSIS — M86172 Other acute osteomyelitis, left ankle and foot: Secondary | ICD-10-CM | POA: Diagnosis not present

## 2023-09-05 DIAGNOSIS — I5042 Chronic combined systolic (congestive) and diastolic (congestive) heart failure: Secondary | ICD-10-CM | POA: Diagnosis not present

## 2023-09-05 DIAGNOSIS — E66811 Obesity, class 1: Secondary | ICD-10-CM | POA: Diagnosis not present

## 2023-09-05 DIAGNOSIS — M869 Osteomyelitis, unspecified: Secondary | ICD-10-CM

## 2023-09-05 DIAGNOSIS — I77810 Thoracic aortic ectasia: Secondary | ICD-10-CM | POA: Diagnosis not present

## 2023-09-05 DIAGNOSIS — I96 Gangrene, not elsewhere classified: Secondary | ICD-10-CM | POA: Diagnosis not present

## 2023-09-05 DIAGNOSIS — I509 Heart failure, unspecified: Secondary | ICD-10-CM | POA: Diagnosis not present

## 2023-09-05 DIAGNOSIS — L97529 Non-pressure chronic ulcer of other part of left foot with unspecified severity: Secondary | ICD-10-CM | POA: Diagnosis not present

## 2023-09-05 DIAGNOSIS — E1169 Type 2 diabetes mellitus with other specified complication: Secondary | ICD-10-CM

## 2023-09-05 DIAGNOSIS — N186 End stage renal disease: Secondary | ICD-10-CM | POA: Diagnosis not present

## 2023-09-05 DIAGNOSIS — R569 Unspecified convulsions: Secondary | ICD-10-CM | POA: Diagnosis not present

## 2023-09-05 DIAGNOSIS — I132 Hypertensive heart and chronic kidney disease with heart failure and with stage 5 chronic kidney disease, or end stage renal disease: Secondary | ICD-10-CM | POA: Diagnosis not present

## 2023-09-05 DIAGNOSIS — E1122 Type 2 diabetes mellitus with diabetic chronic kidney disease: Secondary | ICD-10-CM | POA: Diagnosis not present

## 2023-09-05 DIAGNOSIS — J45909 Unspecified asthma, uncomplicated: Secondary | ICD-10-CM | POA: Diagnosis not present

## 2023-09-05 DIAGNOSIS — Z992 Dependence on renal dialysis: Secondary | ICD-10-CM | POA: Diagnosis not present

## 2023-09-05 DIAGNOSIS — I330 Acute and subacute infective endocarditis: Secondary | ICD-10-CM | POA: Diagnosis not present

## 2023-09-05 DIAGNOSIS — D631 Anemia in chronic kidney disease: Secondary | ICD-10-CM | POA: Diagnosis not present

## 2023-09-05 DIAGNOSIS — Z9889 Other specified postprocedural states: Secondary | ICD-10-CM | POA: Diagnosis not present

## 2023-09-05 HISTORY — PX: INCISION AND DRAINAGE: SHX5863

## 2023-09-05 LAB — HEPATITIS B SURFACE ANTIBODY, QUANTITATIVE: Hep B S AB Quant (Post): 4.3 m[IU]/mL — ABNORMAL LOW

## 2023-09-05 LAB — BASIC METABOLIC PANEL
Anion gap: 12 (ref 5–15)
BUN: 41 mg/dL — ABNORMAL HIGH (ref 6–20)
CO2: 27 mmol/L (ref 22–32)
Calcium: 9.3 mg/dL (ref 8.9–10.3)
Chloride: 95 mmol/L — ABNORMAL LOW (ref 98–111)
Creatinine, Ser: 10.37 mg/dL — ABNORMAL HIGH (ref 0.61–1.24)
GFR, Estimated: 6 mL/min — ABNORMAL LOW (ref >60)
Glucose, Bld: 158 mg/dL — ABNORMAL HIGH (ref 70–99)
Potassium: 3.8 mmol/L (ref 3.5–5.1)
Sodium: 134 mmol/L — ABNORMAL LOW (ref 135–145)

## 2023-09-05 LAB — TROPONIN I (HIGH SENSITIVITY)
Troponin I (High Sensitivity): 38 ng/L — ABNORMAL HIGH (ref <18)
Troponin I (High Sensitivity): 38 ng/L — ABNORMAL HIGH (ref <18)

## 2023-09-05 LAB — CBC
HCT: 34 % — ABNORMAL LOW (ref 39.0–52.0)
Hemoglobin: 10.9 g/dL — ABNORMAL LOW (ref 13.0–17.0)
MCH: 26.8 pg (ref 26.0–34.0)
MCHC: 32.1 g/dL (ref 30.0–36.0)
MCV: 83.5 fL (ref 80.0–100.0)
Platelets: 292 10*3/uL (ref 150–400)
RBC: 4.07 MIL/uL — ABNORMAL LOW (ref 4.22–5.81)
RDW: 18.3 % — ABNORMAL HIGH (ref 11.5–15.5)
WBC: 12.4 10*3/uL — ABNORMAL HIGH (ref 4.0–10.5)
nRBC: 0 % (ref 0.0–0.2)

## 2023-09-05 LAB — GLUCOSE, CAPILLARY
Glucose-Capillary: 130 mg/dL — ABNORMAL HIGH (ref 70–99)
Glucose-Capillary: 187 mg/dL — ABNORMAL HIGH (ref 70–99)
Glucose-Capillary: 163 mg/dL — ABNORMAL HIGH (ref 70–99)
Glucose-Capillary: 139 mg/dL — ABNORMAL HIGH (ref 70–99)
Glucose-Capillary: 137 mg/dL — ABNORMAL HIGH (ref 70–99)
Glucose-Capillary: 255 mg/dL — ABNORMAL HIGH (ref 70–99)

## 2023-09-05 LAB — PROTIME-INR
INR: 1.3 — ABNORMAL HIGH (ref 0.8–1.2)
Prothrombin Time: 16.6 s — ABNORMAL HIGH (ref 11.4–15.2)

## 2023-09-05 LAB — APTT: aPTT: 37 s — ABNORMAL HIGH (ref 24–36)

## 2023-09-05 SURGERY — INCISION AND DRAINAGE
Anesthesia: Monitor Anesthesia Care | Site: Foot | Laterality: Left

## 2023-09-05 MED ORDER — SODIUM CHLORIDE 0.9 % IR SOLN
Status: DC | PRN
  Administered 2023-09-05: 3000 mL

## 2023-09-05 MED ORDER — PROPOFOL 500 MG/50ML IV EMUL: INTRAVENOUS | Status: DC | PRN

## 2023-09-05 MED ORDER — OXYCODONE HCL 5 MG PO TABS: 5.0000 mg | ORAL_TABLET | Freq: Once | ORAL | Status: DC | PRN

## 2023-09-05 MED ORDER — CHLORHEXIDINE GLUCONATE 0.12 % MT SOLN
OROMUCOSAL | Status: AC
  Administered 2023-09-05: 15 mL via OROMUCOSAL
  Filled 2023-09-05: qty 15

## 2023-09-05 MED ORDER — 0.9 % SODIUM CHLORIDE (POUR BTL) OPTIME
TOPICAL | Status: DC | PRN
  Administered 2023-09-05: 1000 mL

## 2023-09-05 MED ORDER — LIDOCAINE 2% (20 MG/ML) 5 ML SYRINGE
INTRAMUSCULAR | Status: DC | PRN
  Administered 2023-09-05: 40 mg via INTRAVENOUS

## 2023-09-05 MED ORDER — DILTIAZEM HCL-DEXTROSE 125-5 MG/125ML-% IV SOLN (PREMIX)
5.0000 mg/h | INTRAVENOUS | Status: DC
  Administered 2023-09-05: 5 mg/h via INTRAVENOUS
  Filled 2023-09-05: qty 125

## 2023-09-05 MED ORDER — FENTANYL CITRATE (PF) 250 MCG/5ML IJ SOLN
INTRAMUSCULAR | Status: DC | PRN
  Administered 2023-09-05 (×2): 25 ug via INTRAVENOUS
  Administered 2023-09-05: 50 ug via INTRAVENOUS

## 2023-09-05 MED ORDER — OXYCODONE HCL 5 MG/5ML PO SOLN: 5.0000 mg | Freq: Once | ORAL | Status: DC | PRN

## 2023-09-05 MED ORDER — HEPARIN (PORCINE) 25000 UT/250ML-% IV SOLN
2200.0000 [IU]/h | INTRAVENOUS | Status: DC
  Administered 2023-09-05: 1550 [IU]/h via INTRAVENOUS
  Administered 2023-09-06: 2000 [IU]/h via INTRAVENOUS
  Administered 2023-09-06: 2200 [IU]/h via INTRAVENOUS
  Filled 2023-09-05 (×4): qty 250

## 2023-09-05 MED ORDER — VANCOMYCIN HCL 500 MG IV SOLR
INTRAVENOUS | Status: AC
  Filled 2023-09-05: qty 10

## 2023-09-05 MED ORDER — FENTANYL CITRATE (PF) 250 MCG/5ML IJ SOLN
INTRAMUSCULAR | Status: AC
  Filled 2023-09-05: qty 5

## 2023-09-05 MED ORDER — SODIUM CHLORIDE 0.9 % IV SOLN: INTRAVENOUS | Status: DC | PRN

## 2023-09-05 MED ORDER — INSULIN ASPART 100 UNIT/ML IJ SOLN: 0.0000 [IU] | INTRAMUSCULAR | Status: DC | PRN

## 2023-09-05 MED ORDER — METOPROLOL TARTRATE 5 MG/5ML IV SOLN
2.0000 mg | Freq: Once | INTRAVENOUS | Status: AC
  Administered 2023-09-05: 2 mg via INTRAVENOUS

## 2023-09-05 MED ORDER — MIDAZOLAM HCL 2 MG/2ML IJ SOLN
INTRAMUSCULAR | Status: DC | PRN
  Administered 2023-09-05: 2 mg via INTRAVENOUS

## 2023-09-05 MED ORDER — TRIMETHOBENZAMIDE HCL 100 MG/ML IM SOLN
200.0000 mg | Freq: Once | INTRAMUSCULAR | Status: AC
  Administered 2023-09-05: 200 mg via INTRAMUSCULAR
  Filled 2023-09-05: qty 2

## 2023-09-05 MED ORDER — ONDANSETRON HCL 4 MG/2ML IJ SOLN
INTRAMUSCULAR | Status: DC | PRN
  Administered 2023-09-05: 4 mg via INTRAVENOUS

## 2023-09-05 MED ORDER — PHENYLEPHRINE 80 MCG/ML (10ML) SYRINGE FOR IV PUSH (FOR BLOOD PRESSURE SUPPORT)
PREFILLED_SYRINGE | INTRAVENOUS | Status: DC | PRN
  Administered 2023-09-05 (×4): 80 ug via INTRAVENOUS

## 2023-09-05 MED ORDER — DILTIAZEM LOAD VIA INFUSION
20.0000 mg | Freq: Once | INTRAVENOUS | Status: AC
  Administered 2023-09-05: 20 mg via INTRAVENOUS
  Filled 2023-09-05: qty 20

## 2023-09-05 MED ORDER — AMIODARONE HCL IN DEXTROSE 360-4.14 MG/200ML-% IV SOLN
60.0000 mg/h | INTRAVENOUS | Status: DC
  Administered 2023-09-05 (×2): 60 mg/h via INTRAVENOUS
  Filled 2023-09-05 (×2): qty 200

## 2023-09-05 MED ORDER — METOPROLOL TARTRATE 5 MG/5ML IV SOLN
INTRAVENOUS | Status: AC
  Filled 2023-09-05: qty 5

## 2023-09-05 MED ORDER — PROPOFOL 500 MG/50ML IV EMUL
INTRAVENOUS | Status: DC | PRN
  Administered 2023-09-05: 180 ug/kg/min via INTRAVENOUS

## 2023-09-05 MED ORDER — SODIUM CHLORIDE 0.9 % IV SOLN: INTRAVENOUS | Status: DC

## 2023-09-05 MED ORDER — ORAL CARE MOUTH RINSE: 15.0000 mL | Freq: Once | OROMUCOSAL | Status: AC

## 2023-09-05 MED ORDER — ACETAMINOPHEN 325 MG PO TABS: 325.0000 mg | ORAL_TABLET | ORAL | Status: DC | PRN

## 2023-09-05 MED ORDER — TOBRAMYCIN SULFATE 80 MG/2ML IJ SOLN
INTRAMUSCULAR | Status: AC
  Filled 2023-09-05: qty 4

## 2023-09-05 MED ORDER — ACETAMINOPHEN 160 MG/5ML PO SOLN
325.0000 mg | ORAL | Status: DC | PRN
Start: 2023-09-05 — End: 2023-09-05

## 2023-09-05 MED ORDER — METRONIDAZOLE 500 MG/100ML IV SOLN
INTRAVENOUS | Status: AC
  Filled 2023-09-05: qty 100

## 2023-09-05 MED ORDER — FENTANYL CITRATE (PF) 100 MCG/2ML IJ SOLN: 25.0000 ug | INTRAMUSCULAR | Status: DC | PRN

## 2023-09-05 MED ORDER — ACETAMINOPHEN 10 MG/ML IV SOLN: 1000.0000 mg | Freq: Once | INTRAVENOUS | Status: DC | PRN

## 2023-09-05 MED ORDER — DILTIAZEM LOAD VIA INFUSION
20.0000 mg | Freq: Once | INTRAVENOUS | Status: AC
  Administered 2023-09-05: 20 mg via INTRAVENOUS

## 2023-09-05 MED ORDER — DROPERIDOL 2.5 MG/ML IJ SOLN: 0.6250 mg | Freq: Once | INTRAMUSCULAR | Status: DC | PRN

## 2023-09-05 MED ORDER — AMIODARONE HCL IN DEXTROSE 360-4.14 MG/200ML-% IV SOLN
30.0000 mg/h | INTRAVENOUS | Status: DC
  Administered 2023-09-06: 30 mg/h via INTRAVENOUS
  Filled 2023-09-05: qty 200

## 2023-09-05 MED ORDER — PROPOFOL 10 MG/ML IV BOLUS
INTRAVENOUS | Status: DC | PRN
  Administered 2023-09-05: 20 mg via INTRAVENOUS

## 2023-09-05 MED ORDER — LIDOCAINE 2% (20 MG/ML) 5 ML SYRINGE
INTRAMUSCULAR | Status: AC
  Filled 2023-09-05: qty 15

## 2023-09-05 MED ORDER — HEPARIN BOLUS VIA INFUSION
5500.0000 [IU] | Freq: Once | INTRAVENOUS | Status: AC
Start: 2023-09-05 — End: 2023-09-05
  Administered 2023-09-05: 5500 [IU] via INTRAVENOUS
  Filled 2023-09-05: qty 5500

## 2023-09-05 MED ORDER — MIDAZOLAM HCL 2 MG/2ML IJ SOLN
INTRAMUSCULAR | Status: AC
  Filled 2023-09-05: qty 2

## 2023-09-05 MED ORDER — AMIODARONE LOAD VIA INFUSION
150.0000 mg | Freq: Once | INTRAVENOUS | Status: AC
  Administered 2023-09-05: 150 mg via INTRAVENOUS
  Filled 2023-09-05: qty 83.34

## 2023-09-05 MED ORDER — ONDANSETRON HCL 4 MG/2ML IJ SOLN
INTRAMUSCULAR | Status: AC
  Filled 2023-09-05: qty 6

## 2023-09-05 MED ORDER — CHLORHEXIDINE GLUCONATE 0.12 % MT SOLN: 15.0000 mL | Freq: Once | OROMUCOSAL | Status: AC

## 2023-09-05 MED ORDER — EPHEDRINE SULFATE (PRESSORS) 50 MG/ML IJ SOLN
INTRAMUSCULAR | Status: DC | PRN
  Administered 2023-09-05 (×2): 5 mg via INTRAVENOUS

## 2023-09-05 MED ORDER — TRIMETHOBENZAMIDE HCL 100 MG/ML IM SOLN
200.0000 mg | Freq: Four times a day (QID) | INTRAMUSCULAR | Status: DC | PRN
  Administered 2023-09-06: 200 mg via INTRAMUSCULAR
  Filled 2023-09-05 (×2): qty 2

## 2023-09-05 MED ORDER — LIDOCAINE 2% (20 MG/ML) 5 ML SYRINGE: INTRAMUSCULAR | Status: DC | PRN

## 2023-09-05 SURGICAL SUPPLY — 43 items
BEADS BIO ARTH CALC SULFAT 5CC (Bone Implant) IMPLANT
BIOBEADS ARTH CALC SULFATE 5CC (Bone Implant) ×1 IMPLANT
BLADE AVERAGE 25X9 (BLADE) IMPLANT
BLADE OSC/SAGITTAL MD 5.5X18 (BLADE) ×1 IMPLANT
BLADE SURG 10 STRL SS (BLADE) ×1 IMPLANT
BLADE SURG 15 STRL LF DISP TIS (BLADE) ×2 IMPLANT
BNDG COHESIVE 3X5 TAN ST LF (GAUZE/BANDAGES/DRESSINGS) ×1 IMPLANT
BNDG ELASTIC 3INX 5YD STR LF (GAUZE/BANDAGES/DRESSINGS) ×1 IMPLANT
BNDG ELASTIC 4INX 5YD STR LF (GAUZE/BANDAGES/DRESSINGS) IMPLANT
BNDG ESMARK 4X9 LF (GAUZE/BANDAGES/DRESSINGS) ×1 IMPLANT
BNDG GAUZE DERMACEA FLUFF 4 (GAUZE/BANDAGES/DRESSINGS) IMPLANT
CHLORAPREP W/TINT 26 (MISCELLANEOUS) ×1 IMPLANT
CNTNR URN SCR LID CUP LEK RST (MISCELLANEOUS) IMPLANT
DRSG ADAPTIC 3X8 NADH LF (GAUZE/BANDAGES/DRESSINGS) IMPLANT
ELECT REM PT RETURN 9FT ADLT (ELECTROSURGICAL) ×1 IMPLANT
ELECTRODE REM PT RTRN 9FT ADLT (ELECTROSURGICAL) ×1 IMPLANT
GAUZE SPONGE 2X2 STRL 8-PLY (GAUZE/BANDAGES/DRESSINGS) ×2 IMPLANT
GAUZE SPONGE 4X4 12PLY STRL (GAUZE/BANDAGES/DRESSINGS) ×1 IMPLANT
GAUZE STRETCH 2X75IN STRL (MISCELLANEOUS) ×1 IMPLANT
GAUZE XEROFORM 1X8 LF (GAUZE/BANDAGES/DRESSINGS) ×1 IMPLANT
GLOVE BIO SURGEON STRL SZ7.5 (GLOVE) ×1 IMPLANT
GLOVE BIOGEL PI IND STRL 7.5 (GLOVE) ×1 IMPLANT
GOWN STRL REUS W/ TWL LRG LVL3 (GOWN DISPOSABLE) ×2 IMPLANT
KIT BASIN OR (CUSTOM PROCEDURE TRAY) ×1 IMPLANT
NDL BIOPSY JAMSHIDI 8X6 (NEEDLE) IMPLANT
NDL HYPO 25X1 1.5 SAFETY (NEEDLE) ×2 IMPLANT
NEEDLE BIOPSY JAMSHIDI 8X6 (NEEDLE) IMPLANT
NEEDLE HYPO 25X1 1.5 SAFETY (NEEDLE) IMPLANT
PACK ORTHO EXTREMITY (CUSTOM PROCEDURE TRAY) ×1 IMPLANT
PACKING GAUZE IODOFORM 1INX5YD (GAUZE/BANDAGES/DRESSINGS) IMPLANT
PADDING CAST ABS COTTON 4X4 ST (CAST SUPPLIES) ×2 IMPLANT
SET HNDPC FAN SPRY TIP SCT (DISPOSABLE) IMPLANT
SPIKE FLUID TRANSFER (MISCELLANEOUS) ×2 IMPLANT
STAPLER VISISTAT 35W (STAPLE) IMPLANT
STOCKINETTE 4X48 STRL (DRAPES) ×1 IMPLANT
SUT ETHILON 3 0 FSLX (SUTURE) ×1 IMPLANT
SUT PROLENE 3 0 PS 2 (SUTURE) IMPLANT
SUT PROLENE 4 0 PS 2 18 (SUTURE) ×2 IMPLANT
SWAB COLLECTION DEVICE MRSA (MISCELLANEOUS) IMPLANT
SWAB CULTURE ESWAB REG 1ML (MISCELLANEOUS) IMPLANT
SYR CONTROL 10ML LL (SYRINGE) ×2 IMPLANT
UNDERPAD 30X36 HEAVY ABSORB (UNDERPADS AND DIAPERS) ×1 IMPLANT
WATER STERILE IRR 1000ML POUR (IV SOLUTION) ×1 IMPLANT

## 2023-09-05 NOTE — Progress Notes (Signed)
Received patient in bed to unit.  Alert and oriented.  Informed consent signed and in chart.   TX duration:4 hours  Patient tolerated well.  Transported back to the room  Alert, without acute distress.  Hand-off given to patient's nurse.   Access used: dialysis cath Access issues: arterial port aspirates slow and with resistance  Total UF removed: 2000 Medication(s) given: heplock 2.3 units per port Post HD VS: see table below Post HD weight: 109.5kg    09/05/23 0028  Vitals  Temp 98.1 F (36.7 C)  Temp Source Oral  BP (!) 155/75  MAP (mmHg) 99  BP Location Right Wrist  BP Method Automatic  Patient Position (if appropriate) Lying  Pulse Rate 95  Pulse Rate Source Monitor  ECG Heart Rate 96  Resp (!) 26  Oxygen Therapy  SpO2 98 %  O2 Device Room Air  During Treatment Monitoring  Dialysate Potassium Concentration 3  Dialysate Calcium Concentration 2.5  HD Safety Checks Performed Yes  Intra-Hemodialysis Comments Tolerated well  Post Treatment  Dialyzer Clearance Lightly streaked  Hemodialysis Intake (mL) 0 mL  Liters Processed 84.5  Fluid Removed (mL) 2000 mL  Tolerated HD Treatment Yes  Post-Hemodialysis Comments goal met  Hemodialysis Catheter Left Internal jugular Double lumen Permanent (Tunneled)  Placement Date/Time: 07/21/22 1404   Serial / Lot #: [id number]  Expiration Date: 12/19/26  Time Out: Correct patient;Correct site;Correct procedure  Maximum sterile barrier precautions: Hand hygiene;Cap;Mask;Sterile gown;Sterile gloves;Large sterile ...  Site Condition No complications  Blue Lumen Status Flushed;Heparin locked;Dead end cap in place  Red Lumen Status Flushed;Heparin locked;Dead end cap in place  Purple Lumen Status N/A  Catheter fill solution Heparin 1000 units/ml  Catheter fill volume (Arterial) 2.3 cc  Catheter fill volume (Venous) 2.3  Post treatment catheter status Capped and Clamped      [name]

## 2023-09-05 NOTE — Anesthesia Postprocedure Evaluation (Addendum)
Anesthesia Post Note  Patient: Jeremy [name]  Procedure(s) Performed: INCISION AND DRAINAGE WITH 3RD TOE METATARSAL RESECTION LEFT FOOT AND 4TH TOE PROXIMAL PHALANX RESECTION (Left: Foot)     Patient location during evaluation: PACU Anesthesia Type: MAC Level of consciousness: awake and alert Pain management: pain level controlled Vital Signs Assessment: post-procedure vital signs reviewed and stable Respiratory status: spontaneous breathing, nonlabored ventilation, respiratory function stable and patient connected to nasal cannula oxygen Cardiovascular status: stable and blood pressure returned to baseline Postop Assessment: no apparent nausea or vomiting Anesthetic complications: no  No notable events documented.  Last Vitals:  Vitals:   09/05/23 1430 09/05/23 1452  BP: 132/70 (!) 141/80  Pulse: (!) 133 (!) 140  Resp: (!) 26 (!) 23  Temp:  37 C  SpO2: 98% 98%                Shelton Silvas

## 2023-09-05 NOTE — Progress Notes (Signed)
Pt resting in bed comfortably. Pt initial HR was 90s, HR now 130s-150s. BP 111/41 (63). Hollis MDA notified. RN to get EKG. See new orders.

## 2023-09-05 NOTE — Progress Notes (Addendum)
Mr. Mabe is 42 year old with PMH of CAD, HFrecEF, DM who presented with OM of R 4th toe s/p amputation this morning. He developed a fib with RVR with HR in 140s. He is hemodynamically stable and asymptomatic.   HR remain elevated in 140s after 2 diltiazem boluses and continuous infusion at max dosing.  TSH wnl 11 months ago CXR wnl yesterday  P: Stop dilt and start amiodarone. CHADVASC elevated at 3, start heparin drip  1700:  Patient assessed at bedside. HR now in 90s. He is reporting some pressure like pain in his chest that started sometime after he got out of OR.  P: Continue amiodarone Start heparin Repeat EKG and check troponins

## 2023-09-05 NOTE — Progress Notes (Signed)
History and Physical Interval Note:  09/05/2023 9:38 AM  Jeremy [name]  has presented today for surgery, with the diagnosis of Left foot abscess and osteomyelitis of the distal 3rd ray and 4th proximal phalanx.  The various methods of treatment have been discussed with the patient and family. After consideration of risks, benefits and other options for treatment, the patient has consented to   Procedure(s) with comments: INCISION AND DRAINAGE WITH METATARSAL RESECTION LEFT FOOT (Left) - I&D with possible metatarsal resection, left foot as a surgical intervention.  The patient's history has been reviewed, patient examined, no change in status, stable for surgery.  I have reviewed the patient's chart and labs.  Questions were answered to the patient's satisfaction.     Jenelle Mages Jeremy [name]

## 2023-09-05 NOTE — Progress Notes (Signed)
Orthopedic Tech Progress Note Patient Details:  Jeremy [name] November 07, 1980 [id number]  Ortho Devices Type of Ortho Device: Postop shoe/boot Ortho Device/Splint Location: LLE Ortho Device/Splint Interventions: Application, Adjustment   Post Interventions Patient Tolerated: Well  Jeremy [name] Jeremy [name] 09/05/2023, 11:40 AM

## 2023-09-05 NOTE — Plan of Care (Signed)

## 2023-09-05 NOTE — Progress Notes (Cosign Needed)
HD#1 SUBJECTIVE:  Patient Summary: Jeremy [name] is a 42 yo patient with a history of CAD, CHF, T2DM complicated by nephropathy and neuropathy, ESRD on HD MWF, and recent R foot 4th toe amputation who presented to the ED from his podiatrist's office on 12/26 with L foot swelling and purulent discharge concerning for infection. He was admitted to the Internal Medicine Teaching Service on 12/27 for the management of cellulitis and osteomyelitis.   Overnight Events: None  Interim History: Pt reports some anxiety and nausea and emesis this morning. He is nervous about his procedure (3rd ray amputation) but agrees that it is the best and he is ready to get it done. Regarding nausea/emesis, he reports this started yesterday and zofran did not help. He does not feel feverish or chills. He thinks his emesis is due to low food intake and antibiotics. There is no blood. There are no stool changes.  OBJECTIVE:  Vital Signs: Vitals:   09/05/23 1200 09/05/23 1215 09/05/23 1230 09/05/23 1300  BP: (!) 144/81 137/69 (!) 144/87 (!) 140/78  Pulse: (!) 133 (!) 140 (!) 156 (!) 148  Resp: (!) 23 (!) 27 (!) 30 (!) 27  Temp:      TempSrc:      SpO2: 100% 98% 99% 97%  Weight:      Height:       Supplemental O2: Room Air SpO2: 97 %  Filed Weights   09/04/23 1958 09/05/23 0027  Weight: 111.7 kg 109.5 kg     Intake/Output Summary (Last 24 hours) at 09/05/2023 1351 Last data filed at 09/05/2023 1031 Gross per 24 hour  Intake 380.35 ml  Output 2015 ml  Net -1634.65 ml   Net IO Since Admission: -1,634.65 mL [09/05/23 1351]  Physical Exam: Physical Exam Constitutional:      General: He is not in acute distress.    Appearance: He is not ill-appearing.  HENT:     Head: Normocephalic.  Cardiovascular:     Rate and Rhythm: Normal rate.  Pulmonary:     Effort: Pulmonary effort is normal.  Abdominal:     General: Abdomen is flat. Bowel sounds are normal. There is no distension.     Tenderness:  There is no abdominal tenderness. There is no guarding or rebound.  Musculoskeletal:     Right lower leg: No edema.     Left lower leg: Edema present.     Comments: Left foot dorsal surface erythematous and edematous, plantar surface with ulcer, appears to have some purulent, serosanguinous discharge. Erythema appears to have improved from previous. Tenderness improved since yesterday, now very mild. Right foot is s/p 4th toe amputated; stiches still in place, no overt bleeding, purulent discharge, or edema noted.   Skin:    General: Skin is warm.     Capillary Refill: Capillary refill takes less than 2 seconds.  Neurological:     General: No focal deficit present.     Mental Status: He is alert.  Psychiatric:        Mood and Affect: Mood normal.        Behavior: Behavior normal.     Patient Lines/Drains/Airways Status     Active Line/Drains/Airways     Name Placement date Placement time Site Days   Peripheral IV 09/04/23 20 G 1" Left;Posterior Forearm 09/04/23  0445  Forearm  1   Fistula / Graft Right Forearm Arteriovenous fistula 12/18/20  0849  Forearm  991   Hemodialysis Catheter Left Internal jugular Double lumen  Permanent (Tunneled) 07/21/22  1404  Internal jugular  411   Hemodialysis Catheter Left Subclavian 09/18/22  1730  Subclavian  352   Incision (Closed) 09/05/23 Foot Left 09/05/23  1039  -- less than 1             ASSESSMENT/PLAN:  Assessment: Principal Problem:   Cellulitis of foot Active Problems:   Type 2 diabetes mellitus with diabetic nephropathy (HCC)   ESRD on dialysis (HCC)   HF with recovered EF   Diabetic foot infection (HCC)   Type 2 diabetes mellitus with chronic kidney disease on chronic dialysis, with long-term current use of insulin (HCC)   Abscess of left foot   Pyogenic inflammation of bone (HCC)   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   Plan: Cellulitis of the left foot  Osteomyelitis of L 3rd and 4th digits (now s/p resection) Dorsal surface of foot c/w cellulitis, continues to be erythematous, edematous, but only mildly tender to palpation (improving). MRI of left foot confirmed osteomyelitis of 3rd metatarsal head, base of the third proximal phalanx, and medial base of the fourth proximal phalanx, as well as an abscess in the third intermetatarsal space. Podiatry to take patient to OR for surgical management today. Pain regimen on board. Blood cultures without growth <24 hours, will continue broad IV antibiotic coverage for now and await intra-op cultures. - D Dimer elevated on admission, VAS [place] LLE normal. ABIs from August 2024 normal. - Continue IV cefepime, IV flagyl, and IV vancomycin   - Pain management: Tylenol 100 mg q6H for mild/moderate pain, hydromorphone/dilaudid 1 mg q4H PRN for severe pain  - I & D today with podiatry, L foot 3rd ray amputation\ & 4th proximal phalanx resection, Abx beads - PT/OT eval after 12/28 procedure - Follow up VAS [place] LLE    New onset post-op Afib with RVR Pt noticed to exhibit Afib/RVR after his surgical procedures today 12/28. HR as high as 140, blood pressure stable, he is asymptomatic - no CP, palpitations, dizziness, lightheadedness. In PACU metoprolol 2g x2 was tried without improvement. He does not have a history of afib. He does have a history of HFrecEF and takes coreg at home. - Will start diltiazem drip load 20mg  and 5-15mg /hour thereafter - Consider Echo  HFrecEF Nonischemic cardiomyopathy HTN Last TTE in 2023 with recovered EF at 55-60% and GIIDD. Appears euvolemic on exam. On home carvedilol 50 mg BID, hydralazine 50 mg q8H, amlodipine 10 mg daily  at bedtime, and clonidine 0.3 mg BID. Blood pressures here 100s-130s systolic/30s-50s diastolic. Will monitor BP and resume home antihypertensives as needed.  - Resumed carvedilol 50 mg BID with meals - Resumed hydralazine 50 mg q8H   Nausea/Vomiting Pt is NPO and receiving abx. He is also somewhat nervous for his upcoming procedures. Suspect these plus possible viral illness are contributing to his nausea. Fortunately exam is benign, remains afebrile, leukocytosis is resolving. He has failed zofran. Will try tigan. 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 HD MWF with [facility], receives  HD via tunneled dialysis catheter as RUE fistula did not mature. Per patient, catheter last changed about 1 year ago. Patient has been following HD holiday schedule, last HD on Tuesday 12/24. - Nephrology aboard for inpatient HD, thank you - Resumed home Renvela 800 mg daily, MVA daily    T2DM Hgb A1c 8.4 07/16/2022. Home regimen includes  glipizide 5 mg daily, Semglee/glargine 20 units daily at bedtime, and Novolog/aspart 17 units TID with meals. Will hold home glypizide.  - Semglee/glargine 10 units daily at bedtime - Very sensitive SSI with HS coverage   Severe aortic insufficiency Ascending aorta dilatation Aortic dilatation 40 mm noted on 2023 TTE and TEE for evaluation of infective endocarditis. At the time, cardiology and cardiothoracic surgery recommended surgical follow up 4 weeks after discharge but patient did not follow up. Patient was euvolemic on admission and morning exams today. Volume/fluid management with HD sessions. Will consider re-engaging cardiothoracic surgery during current admission if indicated, if not will highly encourage patient to follow up with cardiothoracic surgery at discharge.    Anemia of ESRD Stable. Hgb on admission 11.1.  Holding home PO iron. Will monitor.    HLD Chronic. Resumed home atorvastatin 80 mg daily.   Best Practice: Diet: Regular diet IVF: None VTE: heparin injection 5,000 Units Start: 09/04/23 1400 Code: Full AB: Vanc, cefepime, flagyl Therapy Recs: pending DISPO: Pending PT/OT, antibiotics decision, Afib RVR  Signature: Katheran James, D.O.  Internal Medicine Resident, PGY-1 [facility]  Pager: [phone] 1:51 PM, 09/05/2023   Please contact the on call pager after 5 pm and on weekends at [phone].

## 2023-09-05 NOTE — Progress Notes (Signed)
PHARMACY - ANTICOAGULATION CONSULT NOTE  Pharmacy Consult for heparin Indication: atrial fibrillation  Allergies  Allergen Reactions   Acyclovir And Related Hives   Imdur [Isosorbide Nitrate]     Headaches from nitrate    Patient Measurements: Height: 6\' 5"  (195.6 cm) Weight: 109.5 kg (241 lb 6.5 oz) IBW/kg (Calculated) : 89.1 Heparin Dosing Weight: 109.5 kg  Vital Signs: Temp: 98.6 F (37 C) (12/28 1452) Temp Source: Oral (12/28 1452) BP: 112/69 (12/28 1619) Pulse Rate: 135 (12/28 1619)  Labs: Recent Labs    09/03/23 1830 09/04/23 0232 09/05/23 0516  HGB 11.1* 10.7* 10.9*  HCT 35.1* 34.0* 34.0*  PLT 288 279 292  CREATININE 14.94* 14.74* 10.37*    Estimated Creatinine Clearance: 12.6 mL/min (A) (by C-G formula based on SCr of 10.37 mg/dL (H)).   Medical History: Past Medical History:  Diagnosis Date   Abscess of left groin    Acute blood loss anemia 11/11/2013   Anemia in chronic kidney disease (CKD)    Asthma    Boil of scrotum 11/21/2015   Chest pain    a. 01/2015 Lexiscan MV: EF 28%, inferior, inferolateral, apical ischemia;  b. 01/2015 Cath: nl cors, PCWP 18 mmHg, CO 9.38 L/min, CI 3.53 L/min/m^2.   CHF (congestive heart failure) (HCC)    Depression    Situational   ESRD (end stage renal disease) (HCC)    TTHS- [place]   Essential hypertension    Family history of adverse reaction to anesthesia    sister- "it was too much for her heart" died   GERD (gastroesophageal reflux disease)    Hyperlipidemia    Membranous glomerulonephritis    Morbid obesity (HCC)    Nonischemic cardiomyopathy (HCC)    a. 01/2015 Echo: EF 20-25%, diff HK, Gr 2 DD, Triv AI, mildly dil LA and Ao root.   PONV (postoperative nausea and vomiting)    Seizures (HCC) 02/29/2020   "electralytes were out of wack."   Type II diabetes mellitus (HCC)    a. 01/2015 HbA1c = 8.9.    Medications:  Medications Prior to Admission  Medication Sig Dispense Refill Last Dose/Taking    acetaminophen (TYLENOL) 500 MG tablet Take 1,000 mg by mouth every 6 (six) hours as needed for moderate pain.   09/03/2023 Evening   amLODipine (NORVASC) 10 MG tablet Take 1 tablet (10 mg total) by mouth daily. (Patient taking differently: Take 10 mg by mouth at bedtime.) 90 tablet 3 Past Week   atorvastatin (LIPITOR) 80 MG tablet Take 1 tablet (80 mg total) by mouth daily. 90 tablet 3 09/03/2023 Morning   carvedilol (COREG) 25 MG tablet Take 2 tablets (50 mg total) by mouth 2 (two) times daily. 120 tablet 11 09/03/2023 Morning   cloNIDine (CATAPRES) 0.3 MG tablet Take 1 tablet (0.3 mg total) by mouth 2 (two) times daily as directed. 180 tablet 4 09/03/2023 Morning   ferric citrate (AURYXIA) 1 GM 210 MG(Fe) tablet Take 2 tablets (420 mg total) by mouth with each meal and 2 tablets (420 mg total) with snacks. 300 tablet 11 09/03/2023 Morning   glipiZIDE (GLUCOTROL) 5 MG tablet Take 1 tablet (5 mg total) by mouth daily before breakfast. 90 tablet 1 09/03/2023 Morning   hydrALAZINE (APRESOLINE) 50 MG tablet Take 1 tablet (50 mg total) by mouth 3 (three) times daily. 270 tablet 3 09/03/2023 Noon   insulin aspart (NOVOLOG FLEXPEN) 100 UNIT/ML FlexPen Inject 15 Units into the skin 3 (three) times daily with meals. (Patient taking differently:  Inject 17 Units into the skin 3 (three) times daily with meals.) 9 mL 0 09/03/2023 Noon   insulin glargine-yfgn (SEMGLEE, YFGN,) 100 UNIT/ML Pen Inject 20 Units into the skin at bedtime. 15 mL 11 Past Week   multivitamin (RENA-VIT) TABS tablet Take 1 tablet by mouth daily.   09/03/2023 Morning   nitroGLYCERIN (NITROSTAT) 0.4 MG SL tablet Place 1 tablet (0.4 mg total) under the tongue every 5 (five) minutes x 3 doses as needed within 15 minutes. If chest pain does not resolve, seek medical attention. 25 tablet 6 Taking As Needed   ondansetron (ZOFRAN) 4 MG tablet Take 1 tablet by mouth once a day as needed (Patient taking differently: Take 4 mg by mouth 3 (three) times a  week.) 90 tablet 3 Past Week   oxyCODONE-acetaminophen (PERCOCET) 5-325 MG tablet Take 1 tablet by mouth every 4 (four) hours as needed for severe pain (pain score 7-10). 30 tablet 0 09/03/2023 Evening   pantoprazole (PROTONIX) 20 MG tablet Take 1 tablet (20 mg total) by mouth daily as directed for reflux. 30 tablet 11 09/03/2023 Morning   RENVELA 800 MG tablet Take 800 mg by mouth 3 (three) times daily with meals.   09/03/2023    Assessment: Weyker is 42 year old with PMH of CAD, HFrecEF, ESRD on HD MWF, DM with OM of R 4th toe, s/p amputation this morning. He developed a fib with RVR, HR in 140s. Started on amiodarone. Pharmacy consulted to start heparin infusion. Not on anticoagulation previously. Hgb prior to surgery 10.9 (low at baseline), Plt wnl. Has received subQ heparin 5000 units q 8 hours, with last dose at 1513 today.  Goal of Therapy:  Heparin level 0.3-0.7 units/ml Monitor platelets by anticoagulation protocol: Yes   Plan:  Obtain baseline aPTT/INR Give 5500 units bolus x 1 Start heparin infusion at 1550 units/hr Check anti-Xa level in 6-8 hours and daily while on heparin Continue to monitor H&H and platelets   Thank you for allowing pharmacy to be a part of this patient's care.   Signe Colt, PharmD 09/05/2023 5:22 PM  **Pharmacist phone directory can be found on [email or web address] listed under [facility]**

## 2023-09-05 NOTE — Anesthesia Preprocedure Evaluation (Addendum)
Anesthesia Evaluation  Patient identified by MRN, date of birth, ID band Patient awake    Reviewed: Allergy & Precautions, NPO status , Patient's Chart, lab work & pertinent test results, reviewed documented beta blocker date and time   History of Anesthesia Complications (+) PONV and history of anesthetic complications  Airway Mallampati: II  TM Distance: >3 FB Neck ROM: Full    Dental  (+) Teeth Intact, Dental Advisory Given   Pulmonary asthma    breath sounds clear to auscultation       Cardiovascular hypertension, Pt. on medications and Pt. on home beta blockers +CHF   Rhythm:Regular Rate:Normal  Echo:  1. Left ventricular ejection fraction, by estimation, is 55 to 60%. The  left ventricle has normal function.   2. Right ventricular systolic function is normal. The right ventricular  size is normal.   3. No left atrial/left atrial appendage thrombus was detected.   4. 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   5. The mitral valve is normal in structure. No evidence of mitral valve  regurgitation.   6. Small defect with non central AI jet concerning for perforation of the  NCC approx. 0.2 cm. Small vegetation seen on the NCC (image 59). aortic  valve is sclerotic with mild calcification. Central AI jet with VC ~0.67  cm and holodiastolic flow reversal   (image 56) was seen in the descending aorta. PHT 264 ms. Concerning for  borderline severe AI.   7. No aortic root abscess.     Neuro/Psych Seizures -,  PSYCHIATRIC DISORDERS  Depression       GI/Hepatic ,GERD  Medicated,,  Endo/Other  diabetes, Type 2, Oral Hypoglycemic Agents, Insulin Dependent    Renal/GU ESRF and DialysisRenal disease     Musculoskeletal negative musculoskeletal ROS (+)    Abdominal   Peds  Hematology  (+) Blood dyscrasia, anemia   Anesthesia Other Findings   Reproductive/Obstetrics                              Anesthesia Physical Anesthesia Plan  ASA: 3  Anesthesia Plan: MAC   Post-op Pain Management: Ofirmev IV (intra-op)*   Induction: Intravenous  PONV Risk Score and Plan: 2 and Ondansetron and Propofol infusion  Airway Management Planned: Nasal Cannula and Natural Airway  Additional Equipment: None  Intra-op Plan:   Post-operative Plan:   Informed Consent: I have reviewed the patients History and Physical, chart, labs and discussed the procedure including the risks, benefits and alternatives for the proposed anesthesia with the patient or authorized representative who has indicated his/her understanding and acceptance.       Plan Discussed with: CRNA  Anesthesia Plan Comments:        Anesthesia Quick Evaluation

## 2023-09-05 NOTE — Op Note (Signed)
Full Operative Report  Date of Operation: 10:49 AM, 09/05/2023   Patient: Jeremy [name] - 42 y.o. male  Surgeon: Pilar Plate, DPM   Assistant: None  Diagnosis: Abscess and osteomyelitis of left foot  Procedure:  1. 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 Resection of base of 4th proximal phalanx, left foot 3. Application of calcium sulfate dissolvable beads with vancomycin and tobramycin, left foot    Anesthesia: Monitor Anesthesia Care  Shelton Silvas, MD  Anesthesiologist: Shelton Silvas, MD CRNA: Dairl Ponder, CRNA   Estimated Blood Loss: Minimal  Hemostasis: 1) Anatomical dissection, mechanical compression, electrocautery 2) No tourniquet was used  Implants: Implant Name Type Inv. Item Serial No. Manufacturer Lot No. LRB No. Used Action  BIOBEADS ARTH CALC SULFATE 5CC - [id number] Bone Implant BIOBEADS ARTH CALC SULFATE 5CC  [employer] [id number] Left 1 Implanted    Materials: Prolene 3-0, 1 inch iodoform packing  Injectables: 1) Pre-operatively: None 2) Post-operatively: None   Specimens: Pathology: Left third partial ray amputation for pathology with proximal margin inked, left fourth proximal phalanx base with distal margin inked microbiology: Deep tissue swab culture and bone culture from third proximal phalanx for culture   Antibiotics: IV antibiotics given per schedule on the floor  Drains: None  Complications: Patient tolerated the procedure well without complication.   Operative findings: As below in detailed report  Indications for Procedure: Jeremy [name] presents to Pilar Plate, DPM with a chief complaint of draining ulceration to the plantar aspect of the third metatarsal head.  MRI with concern for osteomyelitis of the third proximal phalanx third metatarsal head as well as the medial base of the fourth proximal phalanx.  Also with abscess.  The patient has failed conservative treatments of various modalities. At this  time the patient has elected to proceed with surgical correction. All alternatives, risks, and complications of the procedures were thoroughly explained to the patient. Patient exhibits appropriate understanding of all discussion points and informed consent was signed and obtained in the chart with no guarantees to surgical outcome given or implied.  Description of Procedure: Patient was brought to the operating room. Patient remained on their hospital bed in the supine position. A surgical timeout was performed and all members of the operating room, the procedure, and the surgical site were identified. anesthesia occurred as per anesthesia record. Local anesthetic as previously described was then injected about the operative field in a local infiltrative block.  The operative lower extremity as noted above was then prepped and draped in the usual sterile manner. The following procedure then began.  Attention was directed to the left lower extremity. A racquet type incision was made over the dorsal medial aspect of the third metatarsal, including the entire digit. A full thickness incision was made down to bone using a #15 blade. The incision was continued through the soft tissue down to the shaft of the third metatarsal. At this time, 3-5 cc of purulent drainage was noted near the metatarsal head. A deep wound culture was taken at this time and passed off the field. Using a #15 blade, the digit was then disarticulated in its entirety at the metatarsophalangeal joint and freed of all soft tissue attachments. The specimen was passed off the field and sent for gross pathology. Next, a key elevator was then used to free up the periosteum on the metatarsal shaft. Using a sagittal saw, the metatarsal was cut transversely.  The proximal margin was inked.  A bone  culture was taken from the third proximal phalanx and sent for culture.  The remaining third ray was passed off the field sent as a grow specimen  Next  attention was directed to the fourth toe.  Dissection was carried out through the prior incision/amputation site to free up and expose the fourth proximal phalanx base.  Next a sagittal saw was used to resect the base of the fourth proximal phalanx transversely.  The distal margin was inked and that was sent as a separate pathologic specimen for examination.  All remaining non-viable and necrotic tissues were sharply resected and removed. Extensor and flexors tendons were grasped with a hemostat and cut proximally. The surgical site was then flushed with of saline with pulse lavage.  The skin flap was then approximated and closed proximally under minimal tension with Prolene 3-0 suture.  The plantar ulceration under the prior third met head was excised and then packed with 1 inch iodoform packing.  The surgical site was then dressed with Betadine Adaptic 4 x 4 Kerlix Ace roll. The patient tolerated both the procedure and anesthesia well with vital signs stable throughout. The patient was transferred in good condition and all vital signs stable  from the OR to recovery under the discretion of anesthesia.  Condition: Vital signs stable, neurovascular status unchanged from preoperative   Surgical plan:  Appear to have clean surgical margins with implantation of antibiotic beads as well to assist with residual infection.  Incision was closed primarily with suture.  No further surgical plans.  Nonweightbearing to the left lower extremity in postop shoe.  Continue antibiotics pending further culture data recommend long-term antibiotic therapy ID consult as needed  The patient will be nonweightbearing in a postop shoe to the operative limb until further instructed. The dressing is to remain clean, dry, and intact. Will continue to follow unless noted elsewhere.   Jeremy [name], DPM [facility]

## 2023-09-05 NOTE — Transfer of Care (Signed)
Immediate Anesthesia Transfer of Care Note  Patient: Jeremy [name]  Procedure(s) Performed: INCISION AND DRAINAGE WITH 3RD TOE METATARSAL RESECTION LEFT FOOT AND 4TH TOE PROXIMAL PHALANX RESECTION (Left: Foot)  Patient Location: PACU  Anesthesia Type:MAC  Level of Consciousness: awake, alert , and oriented  Airway & Oxygen Therapy: Patient Spontanous Breathing  Post-op Assessment: Report given to RN and Post -op Vital signs reviewed and stable  Post vital signs: Reviewed and stable  Last Vitals:  Vitals Value Taken Time  BP 92/56 09/05/23 1058  Temp 36.2 C 09/05/23 1058  Pulse 91 09/05/23 1058  Resp 22 09/05/23 1058  SpO2 92 % 09/05/23 1058    Last Pain:  Vitals:   09/05/23 0901  TempSrc: Oral  PainSc:       Patients Stated Pain Goal: 5 (09/04/23 1921)  Complications: No notable events documented.

## 2023-09-05 NOTE — Progress Notes (Signed)
RN paged IM resident regarding pt HR. IM en route to bedside to assess pt.

## 2023-09-06 ENCOUNTER — Encounter (HOSPITAL_COMMUNITY): Payer: Self-pay | Admitting: Infectious Diseases

## 2023-09-06 DIAGNOSIS — I4891 Unspecified atrial fibrillation: Secondary | ICD-10-CM | POA: Diagnosis not present

## 2023-09-06 LAB — CBC
HCT: 37 % — ABNORMAL LOW (ref 39.0–52.0)
Hemoglobin: 11.5 g/dL — ABNORMAL LOW (ref 13.0–17.0)
MCH: 26.5 pg (ref 26.0–34.0)
MCHC: 31.1 g/dL (ref 30.0–36.0)
MCV: 85.3 fL (ref 80.0–100.0)
Platelets: 345 10*3/uL (ref 150–400)
RBC: 4.34 MIL/uL (ref 4.22–5.81)
RDW: 18.7 % — ABNORMAL HIGH (ref 11.5–15.5)
WBC: 10.5 10*3/uL (ref 4.0–10.5)
nRBC: 0 % (ref 0.0–0.2)

## 2023-09-06 LAB — RENAL FUNCTION PANEL
Albumin: 2.7 g/dL — ABNORMAL LOW (ref 3.5–5.0)
Anion gap: 16 — ABNORMAL HIGH (ref 5–15)
BUN: 54 mg/dL — ABNORMAL HIGH (ref 6–20)
CO2: 22 mmol/L (ref 22–32)
Calcium: 9.4 mg/dL (ref 8.9–10.3)
Chloride: 98 mmol/L (ref 98–111)
Creatinine, Ser: 12.09 mg/dL — ABNORMAL HIGH (ref 0.61–1.24)
GFR, Estimated: 5 mL/min — ABNORMAL LOW (ref >60)
Glucose, Bld: 170 mg/dL — ABNORMAL HIGH (ref 70–99)
Phosphorus: 5.2 mg/dL — ABNORMAL HIGH (ref 2.5–4.6)
Potassium: 3.9 mmol/L (ref 3.5–5.1)
Sodium: 136 mmol/L (ref 135–145)

## 2023-09-06 LAB — GLUCOSE, CAPILLARY
Glucose-Capillary: 186 mg/dL — ABNORMAL HIGH (ref 70–99)
Glucose-Capillary: 179 mg/dL — ABNORMAL HIGH (ref 70–99)
Glucose-Capillary: 121 mg/dL — ABNORMAL HIGH (ref 70–99)
Glucose-Capillary: 252 mg/dL — ABNORMAL HIGH (ref 70–99)

## 2023-09-06 LAB — HEPARIN LEVEL (UNFRACTIONATED)
Heparin Unfractionated: <0.1 [IU]/mL — ABNORMAL LOW (ref 0.30–0.70)
Heparin Unfractionated: 0.19 [IU]/mL — ABNORMAL LOW (ref 0.30–0.70)
Heparin Unfractionated: 0.55 [IU]/mL (ref 0.30–0.70)

## 2023-09-06 MED ORDER — INSULIN GLARGINE-YFGN 100 UNIT/ML ~~LOC~~ SOLN
12.0000 [IU] | Freq: Every day | SUBCUTANEOUS | Status: DC
  Administered 2023-09-06: 12 [IU] via SUBCUTANEOUS
  Filled 2023-09-06 (×2): qty 0.12

## 2023-09-06 MED ORDER — HEPARIN BOLUS VIA INFUSION
3000.0000 [IU] | Freq: Once | INTRAVENOUS | Status: AC
  Administered 2023-09-06: 3000 [IU] via INTRAVENOUS
  Filled 2023-09-06: qty 3000

## 2023-09-06 MED ORDER — TRAZODONE HCL 50 MG PO TABS
50.0000 mg | ORAL_TABLET | Freq: Every evening | ORAL | Status: DC | PRN
  Administered 2023-09-06: 50 mg via ORAL
  Filled 2023-09-06: qty 1

## 2023-09-06 MED ORDER — AMIODARONE HCL 200 MG PO TABS
400.0000 mg | ORAL_TABLET | Freq: Two times a day (BID) | ORAL | Status: DC
Start: 2023-09-06 — End: 2023-09-06

## 2023-09-06 MED ORDER — SEVELAMER CARBONATE 800 MG PO TABS
800.0000 mg | ORAL_TABLET | Freq: Three times a day (TID) | ORAL | Status: DC
  Administered 2023-09-06 – 2023-09-07 (×3): 800 mg via ORAL
  Filled 2023-09-06 (×3): qty 1

## 2023-09-06 NOTE — Progress Notes (Signed)
PHARMACY - ANTICOAGULATION CONSULT NOTE  Pharmacy Consult for heparin Indication: atrial fibrillation  Allergies  Allergen Reactions   Acyclovir And Related Hives   Imdur [Isosorbide Nitrate]     Headaches from nitrate    Patient Measurements: Height: 6\' 5"  (195.6 cm) Weight: 112.5 kg (248 lb 0.3 oz) IBW/kg (Calculated) : 89.1 Heparin Dosing Weight: 109.5 kg  Vital Signs: Temp: 98.8 F (37.1 C) (12/29 1555) Temp Source: Oral (12/29 1555) BP: 128/62 (12/29 1555) Pulse Rate: 93 (12/29 1555)  Labs: Recent Labs    09/04/23 0232 09/05/23 0516 09/05/23 1735 09/05/23 1736 09/05/23 1925 09/06/23 0215 09/06/23 0951 09/06/23 1632  HGB 10.7* 10.9*  --   --   --  11.5*  --   --   HCT 34.0* 34.0*  --   --   --  37.0*  --   --   PLT 279 292  --   --   --  345  --   --   APTT  --   --   --  37*  --   --   --   --   LABPROT  --   --   --  16.6*  --   --   --   --   INR  --   --   --  1.3*  --   --   --   --   HEPARINUNFRC  --   --   --   --   --  <0.10* 0.19* 0.55  CREATININE 14.74* 10.37*  --   --   --  12.09*  --   --   TROPONINIHS  --   --  38*  --  38*  --   --   --     Estimated Creatinine Clearance: 11 mL/min (A) (by C-G formula based on SCr of 12.09 mg/dL (H)).   Medical History: Past Medical History:  Diagnosis Date   Abscess of left groin    Acute blood loss anemia 11/11/2013   Anemia in chronic kidney disease (CKD)    Asthma    Boil of scrotum 11/21/2015   Chest pain    a. 01/2015 Lexiscan MV: EF 28%, inferior, inferolateral, apical ischemia;  b. 01/2015 Cath: nl cors, PCWP 18 mmHg, CO 9.38 L/min, CI 3.53 L/min/m^2.   CHF (congestive heart failure) (HCC)    Depression    Situational   ESRD (end stage renal disease) (HCC)    [place]THS- [place]   Essential hypertension    Family history of adverse reaction to anesthesia    sister- "it was too much for her heart" died   Flash pulmonary edema (HCC) 09/18/2022   GERD (gastroesophageal reflux disease)     Hyperlipidemia    Membranous glomerulonephritis    Morbid obesity (HCC)    Nonischemic cardiomyopathy (HCC)    a. 01/2015 Echo: EF 20-25%, diff HK, Gr 2 DD, Triv AI, mildly dil LA and Ao root.   PONV (postoperative nausea and vomiting)    Seizures (HCC) 02/29/2020   "electralytes were out of wack."   Type II diabetes mellitus (HCC)    a. 01/2015 HbA1c = 8.9.   Assessment: Yap is 42 year old with PMH of CAD, HFrecEF, ESRD on HD MWF, DM with OM of R 4th toe, s/p amputation this morning. He developed a fib with RVR, HR in 140s. Started on amiodarone. Pharmacy consulted to start heparin infusion. Not on anticoagulation previously. Hgb prior to surgery 10.9 (low at  baseline), Plt wnl.   Back in sinus rhythm. Heparin level 0.55 is therapeutic on 2200 units/hr.  Goal of Therapy:  Heparin level 0.3-0.7 units/ml Monitor platelets by anticoagulation protocol: Yes   Plan:  Continue heparin 2200 units/hr Monitor daily heparin level, CBC, signs/symptoms of bleeding  F/u if to continue on oral anticoagulation   Thank you for allowing pharmacy to be a part of this patients care.    Alphia Moh, PharmD, BCPS, BCCP Clinical Pharmacist  Please check AMION for all [facility] Pharmacy phone numbers After 10:00 PM, call Main Pharmacy [phone]

## 2023-09-06 NOTE — Plan of Care (Signed)

## 2023-09-06 NOTE — Progress Notes (Addendum)
HD#2 SUBJECTIVE:  Patient Summary: Jeremy [name] is a 42 yo patient with a history of CAD, CHF, T2DM complicated by nephropathy and neuropathy, ESRD on HD MWF, and recent R foot 4th toe amputation who presented to the ED from his podiatrist's office on 12/26 with L foot swelling and purulent discharge concerning for infection. He was admitted to the Internal Medicine Teaching Service on 12/27 for the management of soft tissue infection and osteomyelitis on left foot.   Overnight Events: None  Interim History: Since yesterday his post-op onset A-fib has converted with amiodarone treatment.  He denies any discomfort this morning.  He does not have pain.  His nausea and vomiting are improving.  He feels much better after surgery.  OBJECTIVE:  Vital Signs: Vitals:   09/06/23 0458 09/06/23 0624 09/06/23 1311 09/06/23 1555  BP: (!) 133/58  (!) 137/57 128/62  Pulse: 90  94 93  Resp:   20 18  Temp: 98.7 F (37.1 C)  99 F (37.2 C) 98.8 F (37.1 C)  TempSrc: Oral  Oral Oral  SpO2: 99%  95% 98%  Weight:  112.5 kg    Height:       Supplemental O2: Room Air SpO2: 98 %  Filed Weights   09/04/23 1958 09/05/23 0027 09/06/23 0624  Weight: 111.7 kg 109.5 kg 112.5 kg     Intake/Output Summary (Last 24 hours) at 09/06/2023 1559 Last data filed at 09/06/2023 1020 Gross per 24 hour  Intake 1269.3 ml  Output --  Net 1269.3 ml   Net IO Since Admission: -365.35 mL [09/06/23 1559]  Physical Exam: Physical Exam Constitutional:      General: He is not in acute distress.    Appearance: Normal appearance. He is not ill-appearing.  Cardiovascular:     Rate and Rhythm: Normal rate.     Pulses: Normal pulses.  Pulmonary:     Effort: Pulmonary effort is normal.  Abdominal:     General: Abdomen is flat.     Tenderness: There is no abdominal tenderness.  Musculoskeletal:     Right lower leg: No edema.     Left lower leg: Edema present.     Comments: Left foot dorsal surface is s/p  surgical procedure now wrapped cleanly with no evidence of bleeding. Right foot is s/p 4th toe amputated; stiches still in place but margins have separated mildly. No overt bleeding, purulent discharge, or edema noted. There is no pain surrounding the site.    Skin:    General: Skin is warm.     Capillary Refill: Capillary refill takes less than 2 seconds.  Neurological:     General: No focal deficit present.     Mental Status: He is alert.  Psychiatric:        Mood and Affect: Mood normal.        Behavior: Behavior normal.     Patient Lines/Drains/Airways Status     Active Line/Drains/Airways     Name Placement date Placement time Site Days   Peripheral IV 09/04/23 20 G 1" Left;Posterior Forearm 09/04/23  0445  Forearm  2   Peripheral IV 09/05/23 22 G 1.75" Anterior;Left;Lateral Forearm 09/05/23  1826  Forearm  1   Fistula / Graft Right Forearm Arteriovenous fistula 12/18/20  0849  Forearm  992   Hemodialysis Catheter Left Internal jugular Double lumen Permanent (Tunneled) 07/21/22  1404  Internal jugular  412   Hemodialysis Catheter Left Subclavian 09/18/22  1730  Subclavian  353   Incision (  Closed) 09/05/23 Foot Left 09/05/23  1039  -- 1             ASSESSMENT/PLAN:  Assessment: Principal Problem:   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) Active Problems:   Type 2 diabetes mellitus with diabetic nephropathy (HCC)   ESRD on dialysis (HCC)   HF with recovered EF   Diabetic foot infection (HCC)   Type 2 diabetes mellitus with chronic kidney disease on chronic dialysis, with long-term current use of insulin (HCC)   Abscess of left foot   Pyogenic inflammation of bone (HCC)   Lone post-op atrial fibrillation (HCC), resolved  Jeremy [name] is a 42 yo patient with a history of CAD, CHF, T2DM , ESRD on HD MWF, and recent R foot 4th toe amputation (12/02) who presented to the ED for L foot swelling and purulent discharge and was admitted to the Internal Medicine  Teaching Service on 12/27 for the management of cellulitis and osteomyelitis.    Plan: Cellulitis of the left foot  Osteomyelitis of L 3rd and 4th digits (now s/p resection) L foot now sp 3rd ray amputation and 4th prox phalanx amputation appears to be doing well. Pain regimen on board. Blood cultures without growth <24 hours, will continue broad IV antibiotic coverage for now and await intra-op cultures. - Continue IV cefepime and IV vancomycin. Stop flagyl as abscess drained. Vanc and tobramycin beads placed during surgery. - Pain management: Tylenol 100 mg q6H for mild/moderate pain, hydromorphone/dilaudid 1 mg q4H PRN for severe pain  - POD1 after I & D with podiatry, L foot 3rd ray amputation\ & 4th proximal phalanx resection, Abx beads - NWB post op, L foot in shoe - PT/OT eval after 12/28 procedure   New onset post-op Afib with RVR Pt in Afib/RVR after his surgical procedures yesterday 12/28. HR as high as 140, blood pressure stable, asymptomatic. In PACU metoprolol 2g x2 was tried without improvement, then diltiazem 20 mg boluses x 2 alongside diltiazem drip did not stop A-fib.  He was then transition to amiodarone with a loading dose of 150 mg and then maintenance 60 mg/h and 30/h at that enable him to convert. He does not have a history of afib. He does have a history of HFrecEF and takes coreg at home. - Will stop amiodarone.  Will observe without antiarrhythmic medicine. - Consider Echo    R foot wound He is s/p 4th toe amputation 12/2. Wound margins are somewhat displaced but grossly intact. No overt pain, bleeding, draining. - wound care  Nausea/Vomiting Pt is NPO and receiving abx. He is also somewhat nervous for his upcoming procedures. Suspect these plus possible viral illness are contributing to his nausea. Fortunately exam is benign, remains afebrile, leukocytosis is resolving. He has failed zofran. Will try tigan. - Tigan   Chronic Stable Issues  HFrecEF Nonischemic  cardiomyopathy HTN Last TTE in 2023 with recovered EF at 55-60% and GIIDD. Appears euvolemic on exam. On home carvedilol 50 mg BID, hydralazine 50 mg q8H, amlodipine 10 mg daily at bedtime, and clonidine 0.3 mg BID. Blood pressures here 100s-130s systolic/30s-50s diastolic. Will monitor BP and resume home antihypertensives as needed.  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 HD MWF with [facility], receives HD via tunneled dialysis catheter as RUE fistula did  not mature. Per patient, catheter last changed about 1 year ago. Patient has been following HD holiday schedule, last HD on Tuesday 12/24. - Nephrology aboard for inpatient HD, thank you - Resumed home Renvela 800 mg daily, MVA daily    T2DM Hgb A1c 8.4 07/16/2022. Home regimen includes  glipizide 5 mg daily, Semglee/glargine 20 units daily at bedtime, and Novolog/aspart 17 units TID with meals. Will stop home glipizide (adjust insulin accordingly) - Semglee/glargine 10 units daily at bedtime - Very sensitive SSI with HS coverage   Severe aortic insufficiency Ascending aorta dilatation Aortic dilatation 40 mm noted on 2023 TTE and TEE for evaluation of infective endocarditis. At the time, cardiology and cardiothoracic surgery recommended surgical follow up 4 weeks after discharge but patient did not follow up. Patient was euvolemic on admission and morning exams today. Volume/fluid management with HD sessions. Will consider re-engaging cardiothoracic surgery during current admission if indicated, if not will highly encourage patient to follow up with cardiothoracic surgery at discharge.    Anemia of ESRD Stable. Hgb on admission 11.1. Holding home PO iron. Will monitor.    HLD Chronic. Resumed home atorvastatin 80 mg daily.     Best Practice: Diet: Regular diet IVF: None VTE: heparin injection 5,000 Units Start: 09/04/23 1400 Code: Full AB: Vanc, cefepime Therapy Recs: pending DISPO: Pending PT/OT, antibiotics decision based on culture results  Signature: Katheran James, D.O.  Internal Medicine Resident, PGY-1 [facility]  Pager: [phone] 3:59 PM, 09/06/2023   Please contact the on call pager after 5 pm and on weekends at [phone].

## 2023-09-06 NOTE — Progress Notes (Signed)
PHARMACY - ANTICOAGULATION Pharmacy Consult for heparin Indication: atrial fibrillation Brief A/P: Heparin level subtherapeutic Increase Heparin rate  Allergies  Allergen Reactions   Acyclovir And Related Hives   Imdur [Isosorbide Nitrate]     Headaches from nitrate    Patient Measurements: Height: 6\' 5"  (195.6 cm) Weight: 109.5 kg (241 lb 6.5 oz) IBW/kg (Calculated) : 89.1 Heparin Dosing Weight: 109.5 kg  Vital Signs: Temp: 98.3 F (36.8 C) (12/29 0100) Temp Source: Oral (12/29 0100) BP: 122/56 (12/28 2050) Pulse Rate: 94 (12/29 0055)  Labs: Recent Labs    09/04/23 0232 09/05/23 0516 09/05/23 1735 09/05/23 1736 09/05/23 1925 09/06/23 0215  HGB 10.7* 10.9*  --   --   --  11.5*  HCT 34.0* 34.0*  --   --   --  37.0*  PLT 279 292  --   --   --  345  APTT  --   --   --  37*  --   --   LABPROT  --   --   --  16.6*  --   --   INR  --   --   --  1.3*  --   --   HEPARINUNFRC  --   --   --   --   --  <0.10*  CREATININE 14.74* 10.37*  --   --   --  12.09*  TROPONINIHS  --   --  38*  --  38*  --     Estimated Creatinine Clearance: 10.8 mL/min (A) (by C-G formula based on SCr of 12.09 mg/dL (H)).   Assessment: 42 y.o. male with Afib for heparin  Goal of Therapy:  Heparin level 0.3-0.7 units/ml Monitor platelets by anticoagulation protocol: Yes   Plan:  Heparin 3000 units IV bolus, then increase heparin   2000 units/hr Check heparin level in 6 hours.   Geannie Risen, PharmD, BCPS

## 2023-09-06 NOTE — Progress Notes (Signed)
PHARMACY - ANTICOAGULATION CONSULT NOTE  Pharmacy Consult for heparin Indication: atrial fibrillation  Allergies  Allergen Reactions   Acyclovir And Related Hives   Imdur [Isosorbide Nitrate]     Headaches from nitrate    Patient Measurements: Height: 6\' 5"  (195.6 cm) Weight: 112.5 kg (248 lb 0.3 oz) IBW/kg (Calculated) : 89.1 Heparin Dosing Weight: 109.5 kg  Vital Signs: Temp: 98.7 F (37.1 C) (12/29 0458) Temp Source: Oral (12/29 0458) BP: 133/58 (12/29 0458) Pulse Rate: 90 (12/29 0458)  Labs: Recent Labs    09/04/23 0232 09/05/23 0516 09/05/23 1735 09/05/23 1736 09/05/23 1925 09/06/23 0215 09/06/23 0951  HGB 10.7* 10.9*  --   --   --  11.5*  --   HCT 34.0* 34.0*  --   --   --  37.0*  --   PLT 279 292  --   --   --  345  --   APTT  --   --   --  37*  --   --   --   LABPROT  --   --   --  16.6*  --   --   --   INR  --   --   --  1.3*  --   --   --   HEPARINUNFRC  --   --   --   --   --  <0.10* 0.19*  CREATININE 14.74* 10.37*  --   --   --  12.09*  --   TROPONINIHS  --   --  38*  --  38*  --   --     Estimated Creatinine Clearance: 11 mL/min (A) (by C-G formula based on SCr of 12.09 mg/dL (H)).   Medical History: Past Medical History:  Diagnosis Date   Abscess of left groin    Acute blood loss anemia 11/11/2013   Anemia in chronic kidney disease (CKD)    Asthma    Boil of scrotum 11/21/2015   Chest pain    a. 01/2015 Lexiscan MV: EF 28%, inferior, inferolateral, apical ischemia;  b. 01/2015 Cath: nl cors, PCWP 18 mmHg, CO 9.38 L/min, CI 3.53 L/min/m^2.   CHF (congestive heart failure) (HCC)    Depression    Situational   ESRD (end stage renal disease) (HCC)    TTHS- [place]   Essential hypertension    Family history of adverse reaction to anesthesia    sister- "it was too much for her heart" died   GERD (gastroesophageal reflux disease)    Hyperlipidemia    Membranous glomerulonephritis    Morbid obesity (HCC)    Nonischemic cardiomyopathy  (HCC)    a. 01/2015 Echo: EF 20-25%, diff HK, Gr 2 DD, Triv AI, mildly dil LA and Ao root.   PONV (postoperative nausea and vomiting)    Seizures (HCC) 02/29/2020   "electralytes were out of wack."   Type II diabetes mellitus (HCC)    a. 01/2015 HbA1c = 8.9.    Medications:  Medications Prior to Admission  Medication Sig Dispense Refill Last Dose/Taking   acetaminophen (TYLENOL) 500 MG tablet Take 1,000 mg by mouth every 6 (six) hours as needed for moderate pain.   09/03/2023 Evening   amLODipine (NORVASC) 10 MG tablet Take 1 tablet (10 mg total) by mouth daily. (Patient taking differently: Take 10 mg by mouth at bedtime.) 90 tablet 3 Past Week   atorvastatin (LIPITOR) 80 MG tablet Take 1 tablet (80 mg total) by mouth daily. 90 tablet 3  09/03/2023 Morning   carvedilol (COREG) 25 MG tablet Take 2 tablets (50 mg total) by mouth 2 (two) times daily. 120 tablet 11 09/03/2023 Morning   cloNIDine (CATAPRES) 0.3 MG tablet Take 1 tablet (0.3 mg total) by mouth 2 (two) times daily as directed. 180 tablet 4 09/03/2023 Morning   ferric citrate (AURYXIA) 1 GM 210 MG(Fe) tablet Take 2 tablets (420 mg total) by mouth with each meal and 2 tablets (420 mg total) with snacks. 300 tablet 11 09/03/2023 Morning   glipiZIDE (GLUCOTROL) 5 MG tablet Take 1 tablet (5 mg total) by mouth daily before breakfast. 90 tablet 1 09/03/2023 Morning   hydrALAZINE (APRESOLINE) 50 MG tablet Take 1 tablet (50 mg total) by mouth 3 (three) times daily. 270 tablet 3 09/03/2023 Noon   insulin aspart (NOVOLOG FLEXPEN) 100 UNIT/ML FlexPen Inject 15 Units into the skin 3 (three) times daily with meals. (Patient taking differently: Inject 17 Units into the skin 3 (three) times daily with meals.) 9 mL 0 09/03/2023 Noon   insulin glargine-yfgn (SEMGLEE, YFGN,) 100 UNIT/ML Pen Inject 20 Units into the skin at bedtime. 15 mL 11 Past Week   multivitamin (RENA-VIT) TABS tablet Take 1 tablet by mouth daily.   09/03/2023 Morning   nitroGLYCERIN  (NITROSTAT) 0.4 MG SL tablet Place 1 tablet (0.4 mg total) under the tongue every 5 (five) minutes x 3 doses as needed within 15 minutes. If chest pain does not resolve, seek medical attention. 25 tablet 6 Taking As Needed   ondansetron (ZOFRAN) 4 MG tablet Take 1 tablet by mouth once a day as needed (Patient taking differently: Take 4 mg by mouth 3 (three) times a week.) 90 tablet 3 Past Week   oxyCODONE-acetaminophen (PERCOCET) 5-325 MG tablet Take 1 tablet by mouth every 4 (four) hours as needed for severe pain (pain score 7-10). 30 tablet 0 09/03/2023 Evening   pantoprazole (PROTONIX) 20 MG tablet Take 1 tablet (20 mg total) by mouth daily as directed for reflux. 30 tablet 11 09/03/2023 Morning   RENVELA 800 MG tablet Take 800 mg by mouth 3 (three) times daily with meals.   09/03/2023    Assessment: Eady is 42 year old with PMH of CAD, HFrecEF, ESRD on HD MWF, DM with OM of R 4th toe, s/p amputation this morning. He developed a fib with RVR, HR in 140s. Started on amiodarone. Pharmacy consulted to start heparin infusion. Not on anticoagulation previously. Hgb prior to surgery 10.9 (low at baseline), Plt wnl.   Heparin level 0.19 which is subtherapeutic drawn at 4 hours post dose, plus charting says 0 units/hr for a short time during that period.  Will increase rate and reassess this afternoon.  Goal of Therapy:  Heparin level 0.3-0.7 units/ml Monitor platelets by anticoagulation protocol: Yes   Plan:  Obtain baseline aPTT/INR Give 3000 units bolus x 1 Start heparin infusion at 2200 units/hr Check anti-Xa level in 6-8 hours and daily while on heparin Continue to monitor H&H and platelets   Thank you for allowing pharmacy to be a part of this patients care.   Jeanella Cara, PharmD, [facility] Clinical Pharmacist Please see AMION for all Pharmacists' Contact Phone Numbers 09/06/2023, 10:28 AM

## 2023-09-06 NOTE — Plan of Care (Signed)
°  Problem: Coping: Goal: Ability to adjust to condition or change in health will improve Outcome: Progressing   Problem: Fluid Volume: Goal: Ability to maintain a balanced intake and output will improve Outcome: Progressing   Problem: Health Behavior/Discharge Planning: Goal: Ability to manage health-related needs will improve Outcome: Progressing   Problem: Metabolic: Goal: Ability to maintain appropriate glucose levels will improve Outcome: Progressing   Problem: Nutritional: Goal: Maintenance of adequate nutrition will improve Outcome: Progressing   Problem: Skin Integrity: Goal: Risk for impaired skin integrity will decrease Outcome: Progressing   Problem: Clinical Measurements: Goal: Will remain free from infection Outcome: Progressing

## 2023-09-06 NOTE — Progress Notes (Signed)
[facility] Progress Note   Subjective:   s/p amputation, doing fine overall.  Post op A fib with RVR - now on amio and heparin gtt.  Back in NSR this AM.   Objective Vitals:   09/06/23 0055 09/06/23 0100 09/06/23 0458 09/06/23 0624  BP:   (!) 133/58   Pulse: 94  90   Resp:      Temp:  98.3 F (36.8 C) 98.7 F (37.1 C)   TempSrc:  Oral Oral   SpO2:   99%   Weight:    112.5 kg  Height:       Physical Exam Gen: apparing well  Eyes: anicteric, EOMI ENT: MMM Neck: LIJ TDC c/d/i CV: RRR Abd: soft, nontender Lungs: normal WOB lying flat on RA Extr: L foot with dressing, no LE edema  Neuro: conversant, nonfocal  Additional Objective Labs: Basic Metabolic Panel: Recent Labs  Lab 09/04/23 0232 09/05/23 0516 09/06/23 0215  NA 135 134* 136  K 3.8 3.8 3.9  CL 95* 95* 98  CO2 22 27 22   GLUCOSE 179* 158* 170*  BUN 72* 41* 54*  CREATININE 14.74* 10.37* 12.09*  CALCIUM 9.3 9.3 9.4  PHOS 5.4*  --  5.2*   Liver Function Tests: Recent Labs  Lab 09/03/23 1830 09/04/23 0232 09/06/23 0215  AST 18  --   --   ALT 15  --   --   ALKPHOS 45  --   --   BILITOT 0.7  --   --   PROT 8.0  --   --   ALBUMIN 3.0* 2.7* 2.7*   No results for input(s): "LIPASE", "AMYLASE" in the last 168 hours. CBC: Recent Labs  Lab 09/03/23 1830 09/04/23 0232 09/05/23 0516 09/06/23 0215  WBC 18.7* 17.8* 12.4* 10.5  NEUTROABS 16.2* 14.6*  --   --   HGB 11.1* 10.7* 10.9* 11.5*  HCT 35.1* 34.0* 34.0* 37.0*  MCV 85.4 85.2 83.5 85.3  PLT 288 279 292 345   Blood Culture    Component Value Date/Time   SDES WOUND 09/05/2023 1030   SPECREQUEST left 3rd toe 09/05/2023 1030   CULT PENDING 09/05/2023 1030   REPTSTATUS PENDING 09/05/2023 1030    Cardiac Enzymes: No results for input(s): "CKTOTAL", "CKMB", "CKMBINDEX", "TROPONINI" in the last 168 hours. CBG: Recent Labs  Lab 09/05/23 0914 09/05/23 1057 09/05/23 1514 09/05/23 2057 09/06/23 0628  GLUCAP 163* 139* 137* 255*  186*   Iron Studies: No results for input(s): "IRON", "TIBC", "TRANSFERRIN", "FERRITIN" in the last 72 hours. @lablastinr3 @ Studies/Results: DG Foot 2 Views Left Result Date: 09/05/2023 CLINICAL DATA:  Postoperative state. EXAM: LEFT FOOT - 2 VIEW COMPARISON:  09/03/2023 FINDINGS: Partial amputation of the third ray at the mid third metatarsal. Resection at the base of the fourth toe proximal phalanx. Evidence for antibiotic beads at the resection sites. Atherosclerotic calcifications in the ankle. No unexpected fracture or dislocation. IMPRESSION: Postsurgical changes in the left foot. No unexpected findings. Electronically Signed   By: Richarda Overlie M.D.   On: 09/05/2023 15:18   VAS [place] LOWER EXTREMITY VENOUS (DVT) Result Date: 09/04/2023  Lower Venous DVT Study Patient Name:  Jeremy [name]  Date of Exam:   09/04/2023 Medical Rec #: [id number]        Accession #:    [id number] Date of Birth: [birth year]        Patient Gender: M Patient Age:   42 years Exam Location:  [facility] Procedure:  VAS [place] LOWER EXTREMITY VENOUS (DVT) Referring Phys: JEFFREY HATCHER --------------------------------------------------------------------------------  Indications: Swelling, and Edema.  Comparison Study: No prior exam. Performing Technologist: Fernande Bras  Examination Guidelines: A complete evaluation includes B-mode imaging, spectral Doppler, color Doppler, and power Doppler as needed of all accessible portions of each vessel. Bilateral testing is considered an integral part of a complete examination. Limited examinations for reoccurring indications may be performed as noted. The reflux portion of the exam is performed with the patient in reverse Trendelenburg.  +-----+---------------+---------+-----------+----------+--------------+ RIGHTCompressibilityPhasicitySpontaneityPropertiesThrombus Aging +-----+---------------+---------+-----------+----------+--------------+ CFV  Full           Yes       Yes                                 +-----+---------------+---------+-----------+----------+--------------+ SFJ  Full           Yes      Yes                                 +-----+---------------+---------+-----------+----------+--------------+   +---------+---------------+---------+-----------+----------+--------------+ LEFT     CompressibilityPhasicitySpontaneityPropertiesThrombus Aging +---------+---------------+---------+-----------+----------+--------------+ CFV      Full           Yes      Yes                                 +---------+---------------+---------+-----------+----------+--------------+ SFJ      Full           Yes      Yes                                 +---------+---------------+---------+-----------+----------+--------------+ FV Prox  Full                                                        +---------+---------------+---------+-----------+----------+--------------+ FV Mid   Full                                                        +---------+---------------+---------+-----------+----------+--------------+ FV DistalFull                                                        +---------+---------------+---------+-----------+----------+--------------+ PFV      Full                                                        +---------+---------------+---------+-----------+----------+--------------+ POP      Full           Yes      Yes                                 +---------+---------------+---------+-----------+----------+--------------+  PTV      Full                                                        +---------+---------------+---------+-----------+----------+--------------+ PERO     Full                                                        +---------+---------------+---------+-----------+----------+--------------+ Lymph node in left groin measuring 4.5 x .8 x 2.9 cm.    Summary: RIGHT: - No evidence of  common femoral vein obstruction.   LEFT: - There is no evidence of deep vein thrombosis in the lower extremity.  - No cystic structure found in the popliteal fossa.  *See table(s) above for measurements and observations. Electronically signed by Coral Else MD on 09/04/2023 at 11:21:11 AM.    Final    Medications:  ceFEPime (MAXIPIME) IV 1 g (09/05/23 2148)   heparin 2,000 Units/hr (09/06/23 0539)   vancomycin      acetaminophen  1,000 mg Oral Q6H   atorvastatin  80 mg Oral Daily   carvedilol  50 mg Oral BID WC   Chlorhexidine Gluconate Cloth  6 each Topical Q0600   hydrALAZINE  50 mg Oral Q8H   insulin aspart  0-5 Units Subcutaneous QHS   insulin aspart  0-6 Units Subcutaneous TID WC   insulin glargine-yfgn  12 Units Subcutaneous QHS   multivitamin  1 tablet Oral QHS   sevelamer carbonate  800 mg Oral QAC supper    HD orders [facility] MWF  TDC 180 NR BFR 500 DFR 800 EDW 115 4hrs 2k/2ca Mircera 30 q2wks Hectorol 8 TIW   Assessment/Plan Atlee Jelks is an 42 y.o. male with ESRD on HD TTS, CAD s/p CABG, h/o HFrEF, HTN, DM type 2 currently admitted for osteomyelitis and nephrology is consulted for ESRD co management.    **L foot osteomyelitis: s/p ray amputation 12/28. Abx beads placed, remains on IV abx currently.    **ESRD on HD: Recently switched to MWF outpt. Had HD here Friday.  Next HD Mon on schedule.   Renal diet, fluid restriction.  Avoid morphine and baclofen.    **Anemia: Hb in 10-11s.     **BMM:  cont outpt binder and VDRA.     **HTN: BP reasonable on home meds, UF with HD.   **A fib with RVR: post op complication, now on amio and heparin gtt.  In NSR currently.     **DM and other issues per primary   WIll follow, reach out with concerns    Estill Bakes MD 09/06/2023, 10:16 AM  [facility] Pager: [phone]

## 2023-09-06 NOTE — Progress Notes (Signed)
°  Subjective:  Patient ID: Jeremy [name], male    DOB: October 26, 1980,  MRN: [id number]  Chief Complaint  Patient presents with   Wound Check    DOS: 09/05/23 Procedure: 1. 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 Resection of base of 4th proximal phalanx, left foot 3. Application of calcium sulfate dissolvable beads with vancomycin and tobramycin, left foot  42 y.o. male seen for post op check. He denies pain, overall doing well. Discussed surgical findings and procedures that were performed. Reinforced non weightbearing to left foot.   Review of Systems: Negative except as noted in the HPI. Denies N/V/F/Ch.   Objective:   Vitals:   09/06/23 0100 09/06/23 0458  BP:  (!) 133/58  Pulse:  90  Resp:    Temp: 98.3 F (36.8 C) 98.7 F (37.1 C)  SpO2:  99%   Body mass index is 29.41 kg/m. Constitutional Well developed. Well nourished.  Vascular Foot warm and well perfused. Capillary refill normal to all digits.   No calf pain with palpation  Neurologic Normal speech. Oriented to person, place, and time. Epicritic sensation absent to toes  Dermatologic Dressing with some bloody strike through on plantar aspect suspect where packing is.  Orthopedic: S/p R 3rd partial ray amp. Edema to forefoot.    Radiographs: Resection of 3rd met head and 3rd toe, Resection of base of 4th proximal phalnax. Abx beads in place.   Pathology: Pending  Micro: Few GPC and GNR, pending  Assessment:   1. Diabetic foot infection (HCC)   Abscess and osteomyelitis of left foot  Plan:  Patient was evaluated and treated and all questions answered.  POD # 1 s/p left foot partial 3rd ray amputation, 4th proximal phalanx base resection -Progressing well post op. No pain.  - Will change dressing tomorrow.  -XR: Expected post op changes -WB Status: NWB in post op shoe to left foot -Sutures: to remain intact. -Medications/ABX: continue IV abx pending further culutre data -Dressing left intact.  Will need HH RN for packing dressing changes 3x weekly on DC.  Orders entered.         Corinna Gab, DPM [facility] / [facility]

## 2023-09-07 ENCOUNTER — Encounter (HOSPITAL_COMMUNITY): Payer: Self-pay | Admitting: Podiatry

## 2023-09-07 ENCOUNTER — Other Ambulatory Visit (HOSPITAL_COMMUNITY): Payer: Self-pay

## 2023-09-07 DIAGNOSIS — N186 End stage renal disease: Secondary | ICD-10-CM | POA: Diagnosis not present

## 2023-09-07 DIAGNOSIS — I5020 Unspecified systolic (congestive) heart failure: Secondary | ICD-10-CM | POA: Diagnosis not present

## 2023-09-07 DIAGNOSIS — D631 Anemia in chronic kidney disease: Secondary | ICD-10-CM | POA: Diagnosis not present

## 2023-09-07 DIAGNOSIS — Z992 Dependence on renal dialysis: Secondary | ICD-10-CM | POA: Diagnosis not present

## 2023-09-07 DIAGNOSIS — M869 Osteomyelitis, unspecified: Secondary | ICD-10-CM | POA: Diagnosis not present

## 2023-09-07 DIAGNOSIS — I132 Hypertensive heart and chronic kidney disease with heart failure and with stage 5 chronic kidney disease, or end stage renal disease: Secondary | ICD-10-CM | POA: Diagnosis not present

## 2023-09-07 LAB — CBC
HCT: 32.8 % — ABNORMAL LOW (ref 39.0–52.0)
Hemoglobin: 10.5 g/dL — ABNORMAL LOW (ref 13.0–17.0)
MCH: 26.3 pg (ref 26.0–34.0)
MCHC: 32 g/dL (ref 30.0–36.0)
MCV: 82.2 fL (ref 80.0–100.0)
Platelets: 343 10*3/uL (ref 150–400)
RBC: 3.99 MIL/uL — ABNORMAL LOW (ref 4.22–5.81)
RDW: 18.4 % — ABNORMAL HIGH (ref 11.5–15.5)
WBC: 10.9 10*3/uL — ABNORMAL HIGH (ref 4.0–10.5)
nRBC: 0 % (ref 0.0–0.2)

## 2023-09-07 LAB — HEPARIN LEVEL (UNFRACTIONATED): Heparin Unfractionated: 0.58 [IU]/mL (ref 0.30–0.70)

## 2023-09-07 LAB — BASIC METABOLIC PANEL
Anion gap: 15 (ref 5–15)
BUN: 66 mg/dL — ABNORMAL HIGH (ref 6–20)
CO2: 24 mmol/L (ref 22–32)
Calcium: 9.3 mg/dL (ref 8.9–10.3)
Chloride: 101 mmol/L (ref 98–111)
Creatinine, Ser: 14.28 mg/dL — ABNORMAL HIGH (ref 0.61–1.24)
GFR, Estimated: 4 mL/min — ABNORMAL LOW (ref >60)
Glucose, Bld: 108 mg/dL — ABNORMAL HIGH (ref 70–99)
Potassium: 3.7 mmol/L (ref 3.5–5.1)
Sodium: 140 mmol/L (ref 135–145)

## 2023-09-07 LAB — GLUCOSE, CAPILLARY
Glucose-Capillary: 129 mg/dL — ABNORMAL HIGH (ref 70–99)
Glucose-Capillary: 225 mg/dL — ABNORMAL HIGH (ref 70–99)

## 2023-09-07 MED ORDER — AMOXICILLIN-POT CLAVULANATE 500-125 MG PO TABS
1.0000 | ORAL_TABLET | Freq: Two times a day (BID) | ORAL | Status: DC
Start: 2023-09-07 — End: 2023-09-07
  Administered 2023-09-07: 1 via ORAL
  Filled 2023-09-07 (×2): qty 1

## 2023-09-07 MED ORDER — AMOXICILLIN-POT CLAVULANATE 500-125 MG PO TABS
1.0000 | ORAL_TABLET | Freq: Two times a day (BID) | ORAL | 0 refills | Status: DC
  Filled 2023-09-07: qty 13, 7d supply, fill #0

## 2023-09-07 NOTE — Progress Notes (Signed)
Discharge instructions reviewed with pt and his girlfriend.  Copy of instructions given to pt. [facility] has filled pt's script and will be picked up on the way out for discharge. Pt's unit primary nurse, Judi Cong has educated pt and his girlfriend on his dressing changes and she was able to demonstrate the dressing changes to the primary nurse. Crutches at the bedside to take home. Pt had no questions at this time. Pt getting dressed, girlfriend will go get the car and come to the main entrance.  Pt to be d/c'd via wheelchair with belongings, and will be escorted by hospital volunteer/staff.   Dalyce Renne,RN SWOT

## 2023-09-07 NOTE — Progress Notes (Signed)
Notified by attending that pt will d/c today and have out-pt HD tomorrow. Per attending and nephrologist note today, pt will not require inpt HD today and can f/u at out-pt clinic tomorrow for treatment (holiday schedule due to clinic being closed on Wed). Contacted [facility] to be advised that pt will d/c today and should resume care tomorrow on holiday schedule. Contacted inpt HD unit and made staff aware pt will not require inpt HD today per providers.   Olivia Canter Renal Navigator [phone]

## 2023-09-07 NOTE — Progress Notes (Signed)
PHARMACY - ANTICOAGULATION CONSULT NOTE  Pharmacy Consult for heparin Indication: atrial fibrillation  Allergies  Allergen Reactions   Acyclovir And Related Hives   Imdur [Isosorbide Nitrate]     Headaches from nitrate    Patient Measurements: Height: 6\' 5"  (195.6 cm) Weight: 107 kg (235 lb 14.3 oz) IBW/kg (Calculated) : 89.1 Heparin Dosing Weight: 109.5 kg  Vital Signs: Temp: 98.3 F (36.8 C) (12/30 0602) Temp Source: Oral (12/30 0602) BP: 151/62 (12/30 0602) Pulse Rate: 100 (12/29 2354)  Labs: Recent Labs    09/05/23 0516 09/05/23 0516 09/05/23 1735 09/05/23 1736 09/05/23 1925 09/06/23 0215 09/06/23 0951 09/06/23 1632 09/07/23 0235  HGB 10.9*  --   --   --   --  11.5*  --   --  10.5*  HCT 34.0*  --   --   --   --  37.0*  --   --  32.8*  PLT 292  --   --   --   --  345  --   --  343  APTT  --   --   --  37*  --   --   --   --   --   LABPROT  --   --   --  16.6*  --   --   --   --   --   INR  --   --   --  1.3*  --   --   --   --   --   HEPARINUNFRC  --    < >  --   --   --  <0.10* 0.19* 0.55 0.58  CREATININE 10.37*  --   --   --   --  12.09*  --   --  14.28*  TROPONINIHS  --   --  38*  --  38*  --   --   --   --    < > = values in this interval not displayed.    Estimated Creatinine Clearance: 9.1 mL/min (A) (by C-G formula based on SCr of 14.28 mg/dL (H)).   Medical History: Past Medical History:  Diagnosis Date   Abscess of left groin    Acute blood loss anemia 11/11/2013   Anemia in chronic kidney disease (CKD)    Asthma    Boil of scrotum 11/21/2015   Chest pain    a. 01/2015 Lexiscan MV: EF 28%, inferior, inferolateral, apical ischemia;  b. 01/2015 Cath: nl cors, PCWP 18 mmHg, CO 9.38 L/min, CI 3.53 L/min/m^2.   CHF (congestive heart failure) (HCC)    Depression    Situational   ESRD (end stage renal disease) (HCC)    TTHS- [place]   Essential hypertension    Family history of adverse reaction to anesthesia    sister- "it was too much  for her heart" died   Flash pulmonary edema (HCC) 09/18/2022   GERD (gastroesophageal reflux disease)    Hyperlipidemia    Membranous glomerulonephritis    Morbid obesity (HCC)    Nonischemic cardiomyopathy (HCC)    a. 01/2015 Echo: EF 20-25%, diff HK, Gr 2 DD, Triv AI, mildly dil LA and Ao root.   PONV (postoperative nausea and vomiting)    Seizures (HCC) 02/29/2020   "electralytes were out of wack."   Type II diabetes mellitus (HCC)    a. 01/2015 HbA1c = 8.9.   Assessment: Jeremy [name] is 42 year old with PMH of CAD, HFrecEF, ESRD on HD MWF, DM  with OM of R 4th toe, s/p amputation this morning. He developed a fib with RVR, HR in 140s. Started on amiodarone. Pharmacy consulted to start heparin infusion. Not on anticoagulation previously. Hgb prior to surgery 10.9 (low at baseline), Plt wnl.   Back in sinus rhythm. Heparin level is therapeutic at 0.58 on 2200 units/hr.  Goal of Therapy:  Heparin level 0.3-0.7 units/ml Monitor platelets by anticoagulation protocol: Yes   Plan:  Discussed w/ team - stopping IV heparin drip as Afib has resolved  Thank you for allowing pharmacy to be a part of this patient's care.    Rexford Maus, PharmD, BCPS 09/07/2023 7:08 AM

## 2023-09-07 NOTE — Discharge Planning (Signed)
[facility] Patient Discharge Orders- [facility] CLINIC: [facility]  Patient's name: Jeremy [name] Admit/DC Dates: 09/03/2023 - 09/07/23  Discharge Diagnoses: L foot osteomyelitis s/p ray amputation 09/05/23   ESRD  Aranesp: Given: no   Date and amount of last dose: n/a  Last Hgb: 10.5 PRBC's Given: no Date/# of units: n/a ESA dose for discharge: same dose IV Iron dose at discharge: none  Heparin change: no  EDW Change: Yes New EDW: 108kg  [place]ath Change: no  Access intervention/Change: no Details:  Hectorol/Calcitriol change: Yes- decrease hectorol to IV q HD (hypercalcemia)  Discharge Labs: Calcium 9.3 Phosphorus 5.2 Albumin 2.7 K+ 3.7  IV Antibiotics: no (on PO augmentin) Details:  On Coumadin?: no Last INR: Next INR: Managed By:   OTHER/APPTS/LAB ORDERS:    D/C Meds to be reconciled by nurse after every discharge.  Completed By: Rogers Blocker, PA-C 09/07/2023, 1:22 PM  [facility] Pager: [phone]    Reviewed by: MD:______ RN_______

## 2023-09-07 NOTE — Progress Notes (Signed)
  Subjective:  Patient ID: Jeremy [name], male    DOB: 1981/06/08,  MRN: [id number]  Chief Complaint  Patient presents with   Wound Check    DOS: 09/05/23 Procedure: 1. 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 Resection of base of 4th proximal phalanx, left foot 3. Application of calcium sulfate dissolvable beads with vancomycin and tobramycin, left foot  42 y.o. male seen for post op check. He denies pain, overall doing well. Discussed plan for follow up / home dressing care. He is aware of need to stay off left foot for healing. W  Review of Systems: Negative except as noted in the HPI. Denies N/V/F/Ch.   Objective:   Vitals:   09/07/23 0735 09/07/23 0823  BP: (!) 138/55   Pulse: 97   Resp: 20 20  Temp: 98.2 F (36.8 C)   SpO2: 100%    Body mass index is 27.97 kg/m. Constitutional Well developed. Well nourished.  Vascular Foot warm and well perfused. Capillary refill normal to all digits.   No calf pain with palpation  Neurologic Normal speech. Oriented to person, place, and time. Epicritic sensation absent to toes  Dermatologic Mild maceration of 3rd ray amp site. Well coapted no purulent draiange   Orthopedic: S/p R 3rd partial ray amp.    Radiographs: Resection of 3rd met head and 3rd toe, Resection of base of 4th proximal phalnax. Abx beads in place.   Pathology: Pending  Micro: Few GPC and GNR, pending  Assessment:   1. Diabetic foot infection (HCC)   Abscess and osteomyelitis of left foot  Plan:  Patient was evaluated and treated and all questions answered.  POD # 2 s/p left foot partial 3rd ray amputation, 4th proximal phalanx base resection -Progressing well post op. No pain.  - Dressings changed. No dehisence noted, some maceration at amp site. Applied betadine wet to dry. -XR: Expected post op changes -WB Status: NWB in post op shoe to left foot -Sutures: to remain intact. -Medications/ABX: continue IV abx pending further culutre  data -Dressing left intact. Will need HH RN for packing dressing changes 3x weekly on DC.  Orders entered.  -Pt stable for Dc from my standpoint when dressing care set up. NWB to LLE in post op shoe.  - Small dehisence of right 4th toe amp site - applied betadine dressing. Continue 3x weekly betadine dressings to the 4th toe amp site on right foot. Post op shoe to right foot, not yet ready for regular shoe.          Corinna Gab, DPM [facility] / [facility]

## 2023-09-07 NOTE — Evaluation (Signed)
Physical Therapy Evaluation Patient Details Name: Jeremy [name] MRN: [id number] DOB: [birth year] Today's Date: 09/07/2023  History of Present Illness  Pt is a 42 y/o male admitted w/ L foot pain and wound. MRI L foot showed osteomyelitis. On 12/28, pt underwent left partial 3rd ray amputation, resection of 4th proximal phalanx and application of antibiotic beads. 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   Clinical Impression  Jeremy [name] is 42 y.o. male admitted with above HPI and diagnosis. Patient is currently limited by functional impairments below (see PT problem list). Patient lives with girlfriend and children and is independent with household mobility with no AD since last admission. Patient currently completing bed mobility at mod ind level and for bed mobility and transfers with RW and bil crutches. Educated on NWB of Lt LE and pt maintained through transfers and gait. Pt overall steady with RW and maintained stability with gait using crutches. Reviewed stair mobility with crutches and pt verbalized understanding. Patient will benefit from continued skilled PT interventions to address impairments and progress independence with mobility. Acute PT will follow and progress as able.         If plan is discharge home, recommend the following: Assist for transportation;Help with stairs or ramp for entrance   Can travel by private vehicle        Equipment Recommendations Crutches  Recommendations for Other Services       Functional Status Assessment Patient has had a recent decline in their functional status and demonstrates the ability to make significant improvements in function in a reasonable and predictable amount of time.     Precautions / Restrictions Precautions Precautions: Fall Required Braces or Orthoses: Other Brace Other Brace: post op shoe LLE Restrictions Weight Bearing Restrictions Per Provider Order: Yes RLE Weight Bearing Per  Provider Order: Weight bearing as tolerated LLE Weight Bearing Per Provider Order: Non weight bearing Other Position/Activity Restrictions: bil LE in post-op shoe      Mobility  Bed Mobility Overal bed mobility: Modified Independent                  Transfers Overall transfer level: Modified independent Equipment used: Rolling walker (2 wheels), Crutches               General transfer comment: mod ind, pt using some momentum from low surface. steady once standing. pt with Lt foot resting on floor initially but denied pressure/weight on Lt foot.    Ambulation/Gait Ambulation/Gait assistance: Contact guard assist Gait Distance (Feet): 30 Feet (2x30) Assistive device: Rolling walker (2 wheels), Crutches Gait Pattern/deviations: Step-through pattern, Decreased stride length, Decreased weight shift to left (NWB on Lt LE) Gait velocity: decr     General Gait Details: pt able to maintain NWB on Lt LE with RW. cues to maintain safe position to walker. progressed to gait with crutches, no LOB and pt performed step through pattern.  Stairs            Wheelchair Mobility     Tilt Bed    Modified Rankin (Stroke Patients Only)       Balance Overall balance assessment: Needs assistance Sitting-balance support: No upper extremity supported, Feet supported Sitting balance-Leahy Scale: Normal     Standing balance support: No upper extremity supported, Bilateral upper extremity supported, During functional activity Standing balance-Leahy Scale: Fair Standing balance comment: able to stand without UE support through RLE  Pertinent Vitals/Pain Pain Assessment Pain Assessment: Faces Faces Pain Scale: Hurts a little bit Pain Location: LLE Pain Descriptors / Indicators: Sore, Throbbing Pain Intervention(s): Limited activity within patient's tolerance, Monitored during session, Repositioned    Home Living Family/patient  expects to be discharged to:: Private residence Living Arrangements: Spouse/significant other Available Help at Discharge: Family Type of Home: House Home Access: Stairs to enter Entrance Stairs-Rails: Right Entrance Stairs-Number of Steps: 2   Home Layout: One level Home Equipment: None      Prior Function Prior Level of Function : Independent/Modified Independent;Working/employed               ADLs Comments: 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 reports he was discharged home with some difficulty getting around without DME, had been sink bathing d/t instructions to avoid getting R foot wet (reports covering foot did not work well for showering attempt)     Extremity/Trunk Assessment   Upper Extremity Assessment Upper Extremity Assessment: Overall WFL for tasks assessed    Lower Extremity Assessment Lower Extremity Assessment: Overall WFL for tasks assessed    Cervical / Trunk Assessment Cervical / Trunk Assessment: Normal  Communication   Communication Communication: No apparent difficulties Cueing Techniques: Verbal cues  Cognition Arousal: Alert Behavior During Therapy: WFL for tasks assessed/performed Overall Cognitive Status: Within Functional Limits for tasks assessed                                          General Comments      Exercises     Assessment/Plan    PT Assessment Patient needs continued PT services  PT Problem List Decreased activity tolerance;Decreased balance;Decreased mobility;Decreased knowledge of use of DME;Decreased knowledge of precautions       PT Treatment Interventions DME instruction;Gait training;Stair training;Functional mobility training;Therapeutic activities;Therapeutic exercise;Balance training;Patient/family education    PT Goals (Current goals can be found in the Care Plan section)  Acute Rehab PT Goals Patient Stated Goal: to return home PT Goal  Formulation: With patient Time For Goal Achievement: 09/14/23 Potential to Achieve Goals: Good    Frequency Min 1X/week     Co-evaluation               AM-PAC PT "6 Clicks" Mobility  Outcome Measure Help needed turning from your back to your side while in a flat bed without using bedrails?: None Help needed moving from lying on your back to sitting on the side of a flat bed without using bedrails?: None Help needed moving to and from a bed to a chair (including a wheelchair)?: None Help needed standing up from a chair using your arms (e.g., wheelchair or bedside chair)?: A Little Help needed to walk in hospital room?: A Little Help needed climbing 3-5 steps with a railing? : A Little 6 Click Score: 21    End of Session Equipment Utilized During Treatment: Gait belt Activity Tolerance: Patient tolerated treatment well Patient left: in bed;with call bell/phone within reach Nurse Communication: Mobility status PT Visit Diagnosis: Other abnormalities of gait and mobility (R26.89);Difficulty in walking, not elsewhere classified (R26.2)    Time: 1200-1227 PT Time Calculation (min) (ACUTE ONLY): 27 min   Charges:   PT Evaluation $PT Eval Low Complexity: 1 Low PT Treatments $Gait Training: 8-22 mins PT General Charges $$ ACUTE PT VISIT: 1 Visit  Wynn Maudlin, DPT Acute Rehabilitation Services Office [phone]  09/07/23 1:00 PM

## 2023-09-07 NOTE — Evaluation (Signed)
Occupational Therapy Evaluation Patient Details Name: Jeremy [name] MRN: [id number] DOB: [birth year] Today's Date: 09/07/2023   History of Present Illness Pt is a 42 y/o male admitted w/ L foot pain and wound. MRI L foot showed osteomyelitis. On 12/28, pt underwent left partial 3rd ray amputation, resection of 4th proximal phalanx and application of antibiotic beads. 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   Clinical Impression   PTA, pt lives with family and typically Independent with ADLs, IADLs and mobility though had difficulty mobilizing post op in early December. Pt presents now with minor pain in L foot. Discussed DME options with trial of RW first w/ pt able to mobilize without assistance and reported able to keep LLE NWB while using RW. No physical assistance needed for ADLs; discussed other ADL/IADL strategies, importance of elevation of LLE and continued use of post op shoe to protect operative LLE. Pt functionally appropriate for DC home once medically cleared. No OT needs at DC. Recommend RW vs crutches after trial with PT.       If plan is discharge home, recommend the following: A little help with bathing/dressing/bathroom;Help with stairs or ramp for entrance    Functional Status Assessment  Patient has had a recent decline in their functional status and demonstrates the ability to make significant improvements in function in a reasonable and predictable amount of time.  Equipment Recommendations  Other (comment) (RW vs crutches; defer to PT)    Recommendations for Other Services       Precautions / Restrictions Precautions Precautions: Fall Required Braces or Orthoses: Other Brace Other Brace: post op shoe LLE Restrictions Weight Bearing Restrictions Per Provider Order: Yes LLE Weight Bearing Per Provider Order: Non weight bearing      Mobility Bed Mobility Overal bed mobility: Modified Independent                   Transfers Overall transfer level: Modified independent Equipment used: Rolling walker (2 wheels)               General transfer comment: L foot touching floor though pt reports no weight placed through this LE      Balance Overall balance assessment: Needs assistance Sitting-balance support: No upper extremity supported, Feet supported Sitting balance-Leahy Scale: Normal     Standing balance support: No upper extremity supported, Bilateral upper extremity supported, During functional activity Standing balance-Leahy Scale: Fair Standing balance comment: able to stand without UE support through RLE                           ADL either performed or assessed with clinical judgement   ADL Overall ADL's : Modified independent                                       General ADL Comments: reports able to manage post op shoe and able to reach easily. discussed covering foot for showers, use of shower chair vs continuing sponge bathing; transfers off of toilet (has sink to hold onto at home), elevation of operative LE and wearing post op shoe for protection. discussed stair mgmt and pt interested in trying crutches. pt able to mobilize in room with RW; reports no pressure through LLE though noted L foot touching floor w/ post op shoe donned     Vision Baseline Vision/History:  0 No visual deficits Ability to See in Adequate Light: 0 Adequate Patient Visual Report: No change from baseline Vision Assessment?: No apparent visual deficits     Perception         Praxis         Pertinent Vitals/Pain Pain Assessment Pain Assessment: Faces Faces Pain Scale: Hurts a little bit Pain Location: LLE Pain Descriptors / Indicators: Sore, Throbbing Pain Intervention(s): Monitored during session, Limited activity within patient's tolerance     Extremity/Trunk Assessment Upper Extremity Assessment Upper Extremity Assessment: Overall WFL for tasks  assessed;Right hand dominant   Lower Extremity Assessment Lower Extremity Assessment: Defer to PT evaluation   Cervical / Trunk Assessment Cervical / Trunk Assessment: Normal   Communication Communication Communication: No apparent difficulties Cueing Techniques: Verbal cues   Cognition Arousal: Alert Behavior During Therapy: WFL for tasks assessed/performed Overall Cognitive Status: Within Functional Limits for tasks assessed                                       General Comments  Pt reports MD cleared him to wear regular shoe on R foot; inquired to podiatry with MD reporting plan to assess this AM.    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 reports he was discharged home with some difficulty getting around without DME, had been sink bathing d/t instructions to avoid getting R foot wet (reports covering foot did not work well for showering attempt)        OT Problem List: Decreased knowledge of use of DME or AE;Decreased knowledge of precautions;Pain      OT Treatment/Interventions: Self-care/ADL training;Therapeutic exercise;Energy conservation;DME and/or AE instruction;Therapeutic activities;Patient/family education;Balance training    OT Goals(Current goals can be found in the care plan section) Acute Rehab OT  Goals Patient Stated Goal: for L foot to heal well and prevent another amputation OT Goal Formulation: With patient Time For Goal Achievement: 09/21/23 Potential to Achieve Goals: Good ADL Goals Pt Will Perform Tub/Shower Transfer: Tub transfer;with modified independence;ambulating;shower seat Additional ADL Goal #1: Pt to demo ability to gather ADL/IADL items with Modified Independence  OT Frequency: Min 1X/week    Co-evaluation              AM-PAC OT "6 Clicks" Daily Activity     Outcome Measure Help from another person eating meals?: None Help from another person taking care of personal grooming?: None Help from another person toileting, which includes using toliet, bedpan, or urinal?: None Help from another person bathing (including washing, rinsing, drying)?: None Help from another person to put on and taking off regular upper body clothing?: None Help from another person to put on and taking off regular lower body clothing?: None 6 Click Score: 24  End of Session Equipment Utilized During Treatment: Rolling walker (2 wheels) Nurse Communication: Mobility status  Activity Tolerance: Patient tolerated treatment well Patient left: in bed;with call bell/phone within reach  OT Visit Diagnosis: Other abnormalities of gait and mobility (R26.89);Unsteadiness on feet (R26.81)                Time: 5621-3086 OT Time Calculation (min): 23 min Charges:  OT General Charges $OT Visit: 1 Visit OT Evaluation $OT Eval Low Complexity: 1 Low  Bradd Canary, OTR/L Acute Rehab Services Office: [phone]   Lorre Munroe 09/07/2023, 9:18 AM

## 2023-09-07 NOTE — Plan of Care (Signed)
  Problem: Pain Management: Goal: General experience of comfort will improve Outcome: Progressing

## 2023-09-07 NOTE — Progress Notes (Signed)
Patient ID: Jeremy [name], male   DOB: 11-04-1980, 42 y.o.   MRN: [id number] S: No new complaints. O:BP (!) 138/55 (BP Location: Right Arm)   Pulse 97   Temp 98.2 F (36.8 C) (Oral)   Resp 20   Ht 6\' 5"  (1.956 m)   Wt 107 kg   SpO2 100%   BMI 27.97 kg/m   Intake/Output Summary (Last 24 hours) at 09/07/2023 0939 Last data filed at 09/06/2023 2212 Gross per 24 hour  Intake 918.33 ml  Output --  Net 918.33 ml   Intake/Output: I/O last 3 completed shifts: In: 1809.3 [P.O.:660; I.V.:679.7; IV Piggyback:469.7] Out: -   Intake/Output this shift:  No intake/output data recorded. Weight change: -5.5 kg Gen: NAD CVS: RRR Resp: CTA Abd: +BS, soft, NT/ND Ext: no edema, left foot with bandage in place.   Recent Labs  Lab 09/03/23 1830 09/04/23 0232 09/05/23 0516 09/06/23 0215 09/07/23 0235  NA 136 135 134* 136 140  K 4.0 3.8 3.8 3.9 3.7  CL 96* 95* 95* 98 101  CO2 23 22 27 22 24   GLUCOSE 173* 179* 158* 170* 108*  BUN 66* 72* 41* 54* 66*  CREATININE 14.94* 14.74* 10.37* 12.09* 14.28*  ALBUMIN 3.0* 2.7*  --  2.7*  --   CALCIUM 9.5 9.3 9.3 9.4 9.3  PHOS  --  5.4*  --  5.2*  --   AST 18  --   --   --   --   ALT 15  --   --   --   --    Liver Function Tests: Recent Labs  Lab 09/03/23 1830 09/04/23 0232 09/06/23 0215  AST 18  --   --   ALT 15  --   --   ALKPHOS 45  --   --   BILITOT 0.7  --   --   PROT 8.0  --   --   ALBUMIN 3.0* 2.7* 2.7*   No results for input(s): "LIPASE", "AMYLASE" in the last 168 hours. No results for input(s): "AMMONIA" in the last 168 hours. CBC: Recent Labs  Lab 09/03/23 1830 09/04/23 0232 09/05/23 0516 09/06/23 0215 09/07/23 0235  WBC 18.7* 17.8* 12.4* 10.5 10.9*  NEUTROABS 16.2* 14.6*  --   --   --   HGB 11.1* 10.7* 10.9* 11.5* 10.5*  HCT 35.1* 34.0* 34.0* 37.0* 32.8*  MCV 85.4 85.2 83.5 85.3 82.2  PLT 288 279 292 345 343   Cardiac Enzymes: No results for input(s): "CKTOTAL", "CKMB", "CKMBINDEX", "TROPONINI" in the last 168  hours. CBG: Recent Labs  Lab 09/06/23 0628 09/06/23 1159 09/06/23 1554 09/06/23 2134 09/07/23 0644  GLUCAP 186* 179* 121* 252* 129*    Iron Studies: No results for input(s): "IRON", "TIBC", "TRANSFERRIN", "FERRITIN" in the last 72 hours. Studies/Results: DG Foot 2 Views Left Result Date: 09/05/2023 CLINICAL DATA:  Postoperative state. EXAM: LEFT FOOT - 2 VIEW COMPARISON:  09/03/2023 FINDINGS: Partial amputation of the third ray at the mid third metatarsal. Resection at the base of the fourth toe proximal phalanx. Evidence for antibiotic beads at the resection sites. Atherosclerotic calcifications in the ankle. No unexpected fracture or dislocation. IMPRESSION: Postsurgical changes in the left foot. No unexpected findings. Electronically Signed   By: Richarda Overlie M.D.   On: 09/05/2023 15:18    acetaminophen  1,000 mg Oral Q6H   atorvastatin  80 mg Oral Daily   carvedilol  50 mg Oral BID WC   Chlorhexidine Gluconate Cloth  6 each Topical  Q0600   hydrALAZINE  50 mg Oral Q8H   insulin aspart  0-5 Units Subcutaneous QHS   insulin aspart  0-6 Units Subcutaneous TID WC   insulin glargine-yfgn  12 Units Subcutaneous QHS   multivitamin  1 tablet Oral QHS   sevelamer carbonate  800 mg Oral TID WC    BMET    Component Value Date/Time   NA 140 09/07/2023 0235   NA 141 10/15/2020 1015   K 3.7 09/07/2023 0235   CL 101 09/07/2023 0235   CO2 24 09/07/2023 0235   GLUCOSE 108 (H) 09/07/2023 0235   BUN 66 (H) 09/07/2023 0235   BUN 62 (H) 10/15/2020 1015   CREATININE 14.28 (H) 09/07/2023 0235   CALCIUM 9.3 09/07/2023 0235   GFRNONAA 4 (L) 09/07/2023 0235   GFRAA 9 (L) 10/15/2020 1015   CBC    Component Value Date/Time   WBC 10.9 (H) 09/07/2023 0235   RBC 3.99 (L) 09/07/2023 0235   HGB 10.5 (L) 09/07/2023 0235   HCT 32.8 (L) 09/07/2023 0235   HCT 23.7 (L) 05/30/2016 1157   PLT 343 09/07/2023 0235   MCV 82.2 09/07/2023 0235   MCH 26.3 09/07/2023 0235   MCHC 32.0 09/07/2023 0235    RDW 18.4 (H) 09/07/2023 0235   LYMPHSABS 1.5 09/04/2023 0232   MONOABS 1.3 (H) 09/04/2023 0232   EOSABS 0.3 09/04/2023 0232   BASOSABS 0.1 09/04/2023 0232    HD orders [facility] MWF  TDC 180 NR BFR 500 DFR 800 EDW 115 4hrs 2k/2ca Mircera 30 q2wks Hectorol 8 TIW   Assessment/Plan Tereso Pink is an 42 y.o. male with ESRD on HD TTS, CAD s/p  CABG, h/o HFrEF, HTN, DM type 2 currently admitted for osteomyelitis and nephrology is consulted for ESRD co management.  L foot osteomyelitis: s/p ray amputation 12/28. Abx beads placed, remains on IV abx currently.  ESRD on HD: Recently switched to MWF outpt. Had HD here Friday.  Next HD today on schedule.   Renal diet, fluid restriction.  Avoid morphine and baclofen.  Anemia: Hb in 10-11s.   BMM:  cont outpt binder and VDRA.   HTN: BP reasonable on home meds, UF with HD.  A fib with RVR: post op complication, now on amio and heparin gtt.  In NSR currently.   DM and other issues per primary Disposition - planned for HD today, however if he is clear for discharge, he can go to his outpatient unit tomorrow due to holiday schedule.  No evidence of volume overload and K well controlled.   Irena Cords, MD [facility]

## 2023-09-07 NOTE — TOC Transition Note (Addendum)
Transition of Care [facility]) - Discharge Note   Patient Details  Name: Jeremy [name] MRN: [id number] Date of Birth: 1980-12-24  Transition [facility]) CM/SW Contact:  Leone Haven, RN Phone Number: 09/07/2023, 11:54 AM   Clinical Narrative:    For dc today, NCM offered choice for [facility], to check on wound care to make sure not getting infected , girlfriend is a Engineer, civil (consulting) per patient and she will be doing the dressgings.  He does not have a preference of the agency.  NCM made referral to [facility] with [facility].  She is able to take , soc will begin 24 to 48 hrs post dc.  Patient has transport home, he states he has a walker at home as well. Crutches brought to bedside.   Final next level of care: Home w Home Health Services Barriers to Discharge: No Barriers Identified   Patient Goals and CMS Choice Patient states their goals for this hospitalization and ongoing recovery are:: return home CMS Medicare.gov Compare Post Acute Care list provided to:: Patient Choice offered to / list presented to : Patient      Discharge Placement                       Discharge Plan and Services Additional resources added to the After Visit Summary for                  DME Arranged: N/A DME Agency: NA       HH Arranged: RN HH Agency: [facility] Date [facility] Agency Contacted: 09/07/23 Time HH Agency Contacted: 1154 Representative spoke with at [facility] Agency: Tresa Endo  Social Drivers of Health (SDOH) Interventions SDOH Screenings   Food Insecurity: No Food Insecurity (09/04/2023)  Housing: Low Risk  (09/04/2023)  Transportation Needs: No Transportation Needs (09/04/2023)  Utilities: Not At Risk (09/04/2023)  Depression (PHQ2-9): Low Risk  (10/01/2022)  Physical Activity: Insufficiently Active (11/27/2021)  Social Connections: Unknown (02/17/2023)   Received from [facility]  Stress: Stress Concern Present (11/27/2021)  Tobacco Use: Low Risk   (09/05/2023)     Readmission Risk Interventions     No data to display

## 2023-09-07 NOTE — Progress Notes (Signed)
Orthopedic Tech Progress Note Patient Details:  Jeremy [name] [birth year] [id number]  Ortho Devices Type of Ortho Device: Crutches Ortho Device/Splint Location: LLE Ortho Device/Splint Interventions: Ordered, Application, Adjustment   Post Interventions Patient Tolerated: Well, Ambulated well Instructions Provided: Poper ambulation with device, Care of device  [name] 09/07/2023, 12:50 PM

## 2023-09-07 NOTE — Discharge Instructions (Addendum)
Jeremy [name], Jeremy [name] were hospitalized for a left foot infection.  While here is necessary to amputate part of the left foot in order to best control of the infection.  Fortunately you do not show signs of any systemic infection.  We have discharged you on continued antibiotics.  You will take a medicine called Augmentin.  You will take it for 7 days starting today, and you take it twice a day.  Start taking that medicine tonight, and then take it until you are out of pills.  Remember not to bear weight on your left foot until cleared by your podiatrist.  I will be important to attend to the wounds from your surgical sites when you are at home.  Anticipate changing the dressings 3 times a week. For R foot: Continue 3x weekly betadine dressings to the 4th toe amp site on right foot.  For L foot: 3x weekly home health dressing changes with iodoform packing in plantar ulceration. Ulceration/wpimd to be packed with 1/2 inch iodoform then apply betadine adaptic to amputation site, then 4x4 gauze, kerlix roll and ace to secure. Change MWF.   We will also ask you to hold some of your blood pressure medicines-specifically amlodipine and clonidine.  You have not needed these while you were here.  It might be necessary to restart these medicines in the future, for now do not take the medicine, but please discuss it with your primary care provider and your nephrologist at your next office visit.  Continue to take your Coreg and hydralazine.  Expect a phone call to set up a follow-up with your podiatrist.  We would also like to see you in the primary care office for the [facility].  That appointment is on January 10 at 10:45.  Severe pain, fevers (temperature over 100.4) or persistent chills, or injury to your surgical sites would be reasons to return to the ER.

## 2023-09-08 DIAGNOSIS — E11621 Type 2 diabetes mellitus with foot ulcer: Secondary | ICD-10-CM | POA: Diagnosis not present

## 2023-09-08 DIAGNOSIS — E1169 Type 2 diabetes mellitus with other specified complication: Secondary | ICD-10-CM | POA: Diagnosis not present

## 2023-09-08 DIAGNOSIS — K219 Gastro-esophageal reflux disease without esophagitis: Secondary | ICD-10-CM | POA: Diagnosis not present

## 2023-09-08 DIAGNOSIS — E789 Disorder of lipoprotein metabolism, unspecified: Secondary | ICD-10-CM | POA: Diagnosis not present

## 2023-09-08 DIAGNOSIS — E1129 Type 2 diabetes mellitus with other diabetic kidney complication: Secondary | ICD-10-CM | POA: Diagnosis not present

## 2023-09-08 DIAGNOSIS — I5042 Chronic combined systolic (congestive) and diastolic (congestive) heart failure: Secondary | ICD-10-CM | POA: Diagnosis not present

## 2023-09-08 DIAGNOSIS — I110 Hypertensive heart disease with heart failure: Secondary | ICD-10-CM | POA: Diagnosis not present

## 2023-09-08 DIAGNOSIS — L97521 Non-pressure chronic ulcer of other part of left foot limited to breakdown of skin: Secondary | ICD-10-CM | POA: Diagnosis not present

## 2023-09-08 DIAGNOSIS — N2581 Secondary hyperparathyroidism of renal origin: Secondary | ICD-10-CM | POA: Diagnosis not present

## 2023-09-08 DIAGNOSIS — D509 Iron deficiency anemia, unspecified: Secondary | ICD-10-CM | POA: Diagnosis not present

## 2023-09-08 DIAGNOSIS — I2510 Atherosclerotic heart disease of native coronary artery without angina pectoris: Secondary | ICD-10-CM | POA: Diagnosis not present

## 2023-09-08 DIAGNOSIS — Z4781 Encounter for orthopedic aftercare following surgical amputation: Secondary | ICD-10-CM | POA: Diagnosis not present

## 2023-09-08 DIAGNOSIS — T8249XA Other complication of vascular dialysis catheter, initial encounter: Secondary | ICD-10-CM | POA: Diagnosis not present

## 2023-09-08 DIAGNOSIS — Z992 Dependence on renal dialysis: Secondary | ICD-10-CM | POA: Diagnosis not present

## 2023-09-08 DIAGNOSIS — I351 Nonrheumatic aortic (valve) insufficiency: Secondary | ICD-10-CM | POA: Diagnosis not present

## 2023-09-08 DIAGNOSIS — F4321 Adjustment disorder with depressed mood: Secondary | ICD-10-CM | POA: Diagnosis not present

## 2023-09-08 DIAGNOSIS — M869 Osteomyelitis, unspecified: Secondary | ICD-10-CM | POA: Diagnosis not present

## 2023-09-08 DIAGNOSIS — B964 Proteus (mirabilis) (morganii) as the cause of diseases classified elsewhere: Secondary | ICD-10-CM | POA: Diagnosis not present

## 2023-09-08 DIAGNOSIS — J45909 Unspecified asthma, uncomplicated: Secondary | ICD-10-CM | POA: Diagnosis not present

## 2023-09-08 DIAGNOSIS — D631 Anemia in chronic kidney disease: Secondary | ICD-10-CM | POA: Diagnosis not present

## 2023-09-08 DIAGNOSIS — N186 End stage renal disease: Secondary | ICD-10-CM | POA: Diagnosis not present

## 2023-09-08 DIAGNOSIS — K045 Chronic apical periodontitis: Secondary | ICD-10-CM | POA: Diagnosis not present

## 2023-09-08 DIAGNOSIS — E1122 Type 2 diabetes mellitus with diabetic chronic kidney disease: Secondary | ICD-10-CM | POA: Diagnosis not present

## 2023-09-08 DIAGNOSIS — I428 Other cardiomyopathies: Secondary | ICD-10-CM | POA: Diagnosis not present

## 2023-09-08 DIAGNOSIS — D689 Coagulation defect, unspecified: Secondary | ICD-10-CM | POA: Diagnosis not present

## 2023-09-08 LAB — AEROBIC/ANAEROBIC CULTURE W GRAM STAIN (SURGICAL/DEEP WOUND)

## 2023-09-08 LAB — SURGICAL PATHOLOGY

## 2023-09-09 LAB — CULTURE, BLOOD (ROUTINE X 2)
Culture: NO GROWTH
Culture: NO GROWTH
Special Requests: ADEQUATE
Special Requests: ADEQUATE

## 2023-09-09 LAB — AEROBIC/ANAEROBIC CULTURE W GRAM STAIN (SURGICAL/DEEP WOUND)

## 2023-09-11 DIAGNOSIS — Z992 Dependence on renal dialysis: Secondary | ICD-10-CM | POA: Diagnosis not present

## 2023-09-11 DIAGNOSIS — D689 Coagulation defect, unspecified: Secondary | ICD-10-CM | POA: Diagnosis not present

## 2023-09-11 DIAGNOSIS — N2581 Secondary hyperparathyroidism of renal origin: Secondary | ICD-10-CM | POA: Diagnosis not present

## 2023-09-11 DIAGNOSIS — T8249XA Other complication of vascular dialysis catheter, initial encounter: Secondary | ICD-10-CM | POA: Diagnosis not present

## 2023-09-11 DIAGNOSIS — N186 End stage renal disease: Secondary | ICD-10-CM | POA: Diagnosis not present

## 2023-09-14 DIAGNOSIS — N186 End stage renal disease: Secondary | ICD-10-CM | POA: Diagnosis not present

## 2023-09-14 DIAGNOSIS — D689 Coagulation defect, unspecified: Secondary | ICD-10-CM | POA: Diagnosis not present

## 2023-09-14 DIAGNOSIS — N2581 Secondary hyperparathyroidism of renal origin: Secondary | ICD-10-CM | POA: Diagnosis not present

## 2023-09-14 DIAGNOSIS — Z992 Dependence on renal dialysis: Secondary | ICD-10-CM | POA: Diagnosis not present

## 2023-09-14 DIAGNOSIS — D509 Iron deficiency anemia, unspecified: Secondary | ICD-10-CM | POA: Diagnosis not present

## 2023-09-14 DIAGNOSIS — D631 Anemia in chronic kidney disease: Secondary | ICD-10-CM | POA: Diagnosis not present

## 2023-09-14 DIAGNOSIS — T8249XA Other complication of vascular dialysis catheter, initial encounter: Secondary | ICD-10-CM | POA: Diagnosis not present

## 2023-09-15 DIAGNOSIS — E1122 Type 2 diabetes mellitus with diabetic chronic kidney disease: Secondary | ICD-10-CM | POA: Diagnosis not present

## 2023-09-15 DIAGNOSIS — N2581 Secondary hyperparathyroidism of renal origin: Secondary | ICD-10-CM | POA: Diagnosis not present

## 2023-09-15 DIAGNOSIS — I5042 Chronic combined systolic (congestive) and diastolic (congestive) heart failure: Secondary | ICD-10-CM | POA: Diagnosis not present

## 2023-09-15 DIAGNOSIS — I110 Hypertensive heart disease with heart failure: Secondary | ICD-10-CM | POA: Diagnosis not present

## 2023-09-15 DIAGNOSIS — L97521 Non-pressure chronic ulcer of other part of left foot limited to breakdown of skin: Secondary | ICD-10-CM | POA: Diagnosis not present

## 2023-09-15 DIAGNOSIS — I351 Nonrheumatic aortic (valve) insufficiency: Secondary | ICD-10-CM | POA: Diagnosis not present

## 2023-09-15 DIAGNOSIS — D509 Iron deficiency anemia, unspecified: Secondary | ICD-10-CM | POA: Diagnosis not present

## 2023-09-15 DIAGNOSIS — I428 Other cardiomyopathies: Secondary | ICD-10-CM | POA: Diagnosis not present

## 2023-09-15 DIAGNOSIS — K045 Chronic apical periodontitis: Secondary | ICD-10-CM | POA: Diagnosis not present

## 2023-09-15 DIAGNOSIS — I2510 Atherosclerotic heart disease of native coronary artery without angina pectoris: Secondary | ICD-10-CM | POA: Diagnosis not present

## 2023-09-15 DIAGNOSIS — B964 Proteus (mirabilis) (morganii) as the cause of diseases classified elsewhere: Secondary | ICD-10-CM | POA: Diagnosis not present

## 2023-09-15 DIAGNOSIS — E789 Disorder of lipoprotein metabolism, unspecified: Secondary | ICD-10-CM | POA: Diagnosis not present

## 2023-09-15 DIAGNOSIS — Z4781 Encounter for orthopedic aftercare following surgical amputation: Secondary | ICD-10-CM | POA: Diagnosis not present

## 2023-09-15 DIAGNOSIS — K219 Gastro-esophageal reflux disease without esophagitis: Secondary | ICD-10-CM | POA: Diagnosis not present

## 2023-09-15 DIAGNOSIS — F4321 Adjustment disorder with depressed mood: Secondary | ICD-10-CM | POA: Diagnosis not present

## 2023-09-15 DIAGNOSIS — E1169 Type 2 diabetes mellitus with other specified complication: Secondary | ICD-10-CM | POA: Diagnosis not present

## 2023-09-15 DIAGNOSIS — J45909 Unspecified asthma, uncomplicated: Secondary | ICD-10-CM | POA: Diagnosis not present

## 2023-09-15 DIAGNOSIS — Z992 Dependence on renal dialysis: Secondary | ICD-10-CM | POA: Diagnosis not present

## 2023-09-15 DIAGNOSIS — N186 End stage renal disease: Secondary | ICD-10-CM | POA: Diagnosis not present

## 2023-09-15 DIAGNOSIS — M869 Osteomyelitis, unspecified: Secondary | ICD-10-CM | POA: Diagnosis not present

## 2023-09-15 DIAGNOSIS — D689 Coagulation defect, unspecified: Secondary | ICD-10-CM | POA: Diagnosis not present

## 2023-09-15 DIAGNOSIS — E11621 Type 2 diabetes mellitus with foot ulcer: Secondary | ICD-10-CM | POA: Diagnosis not present

## 2023-09-15 DIAGNOSIS — D631 Anemia in chronic kidney disease: Secondary | ICD-10-CM | POA: Diagnosis not present

## 2023-09-16 DIAGNOSIS — Z992 Dependence on renal dialysis: Secondary | ICD-10-CM | POA: Diagnosis not present

## 2023-09-16 DIAGNOSIS — T8249XA Other complication of vascular dialysis catheter, initial encounter: Secondary | ICD-10-CM | POA: Diagnosis not present

## 2023-09-16 DIAGNOSIS — D689 Coagulation defect, unspecified: Secondary | ICD-10-CM | POA: Diagnosis not present

## 2023-09-16 DIAGNOSIS — N2581 Secondary hyperparathyroidism of renal origin: Secondary | ICD-10-CM | POA: Diagnosis not present

## 2023-09-16 DIAGNOSIS — D509 Iron deficiency anemia, unspecified: Secondary | ICD-10-CM | POA: Diagnosis not present

## 2023-09-16 DIAGNOSIS — D631 Anemia in chronic kidney disease: Secondary | ICD-10-CM | POA: Diagnosis not present

## 2023-09-16 DIAGNOSIS — N186 End stage renal disease: Secondary | ICD-10-CM | POA: Diagnosis not present

## 2023-09-17 ENCOUNTER — Other Ambulatory Visit (HOSPITAL_COMMUNITY): Payer: Self-pay

## 2023-09-17 ENCOUNTER — Encounter: Payer: Self-pay | Admitting: Podiatry

## 2023-09-17 ENCOUNTER — Encounter (HOSPITAL_COMMUNITY): Payer: Self-pay | Admitting: *Deleted

## 2023-09-17 ENCOUNTER — Ambulatory Visit (INDEPENDENT_AMBULATORY_CARE_PROVIDER_SITE_OTHER): Payer: Medicaid Other | Admitting: Podiatry

## 2023-09-17 DIAGNOSIS — L02619 Cutaneous abscess of unspecified foot: Secondary | ICD-10-CM

## 2023-09-17 DIAGNOSIS — Z9889 Other specified postprocedural states: Secondary | ICD-10-CM

## 2023-09-17 DIAGNOSIS — L03119 Cellulitis of unspecified part of limb: Secondary | ICD-10-CM

## 2023-09-17 MED ORDER — AMOXICILLIN-POT CLAVULANATE 500-125 MG PO TABS
1.0000 | ORAL_TABLET | Freq: Every day | ORAL | 0 refills | Status: DC
  Filled 2023-09-17: qty 20, 10d supply, fill #0
  Filled 2023-09-17: qty 10, 10d supply, fill #0

## 2023-09-17 NOTE — Progress Notes (Signed)
  Subjective:  Patient ID: Jeremy [name], male    DOB: February 19, 1981,  MRN: [id number]  Chief Complaint  Patient presents with   Routine Post Op    RM22: hopsital post op/ L 3rd ray and 4th toe partial resection 09/07/23     DOS: 09/05/23 Procedure: 1. 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 Resection of base of 4th proximal phalanx, left foot 3. Application of calcium  sulfate dissolvable beads with vancomycin  and tobramycin , left foot  43 y.o. male seen for post op check.  First office for follow-up status post above procedures.  He reports he does have home health coming out and changing the dressing twice a week.  They are putting packing in the plantar wound.  He has finished a course of Augmentin .  Denies pain.  Denies nausea vomiting fever chills  Review of Systems: Negative except as noted in the HPI. Denies N/V/F/Ch.   Objective:   There were no vitals filed for this visit.  There is no height or weight on file to calculate BMI. Constitutional Well developed. Well nourished.  Vascular Foot warm and well perfused. Capillary refill normal to all digits.   No calf pain with palpation  Neurologic Normal speech. Oriented to person, place, and time. Epicritic sensation absent to toes  Dermatologic Maceration and breakdown along the incision line.  Mild dehiscence noted especially distally.  No active purulent drainage.  No malodor noted.   Orthopedic: S/p R 3rd partial ray amp.    Radiographs: Resection of 3rd met head and 3rd toe, Resection of base of 4th proximal phalnax. Abx beads in place.   Pathology: Skin with ulceration and associated necrotizing inflammation.  Bone at the resection margin does not show osteomyelitis.  Fourth toe proximal phalanx no evidence of acute osteomyelitis  Micro: Proteus mirabilis rare bacteriodes Caccae   Assessment:   Abscess and osteomyelitis of left foot  Plan:  Patient was evaluated and treated and all questions answered.  PO  s/p left foot partial 3rd ray amputation, 4th proximal phalanx base resection -Progressing well post op. No pain.  -Dehiscence noted distally on the incision line however the majority of the amputation site is still coapted.  Concern for possible surgical site infection will send course of Augmentin .  Will try to dry out the incision line with Betadine wet-to-dry dressings.  Continue these with home health nurse twice to 3 times weekly -XR: Expected post op changes -WB Status: Patient came in ambulating in a postop shoe against prior recommendations.  He says he cannot tolerate nonweightbearing with crutches therefore I will allow him to continue walking with his postop shoe on -Sutures: to remain intact. -Medications/ABX: E Rx for Augmentin  twice daily for 10 days -Dressing change Betadine dressing applied          [name], DPM [facility] / [facility]

## 2023-09-18 ENCOUNTER — Encounter: Payer: Medicaid Other | Admitting: Student

## 2023-09-18 DIAGNOSIS — D509 Iron deficiency anemia, unspecified: Secondary | ICD-10-CM | POA: Diagnosis not present

## 2023-09-18 DIAGNOSIS — T8249XA Other complication of vascular dialysis catheter, initial encounter: Secondary | ICD-10-CM | POA: Diagnosis not present

## 2023-09-18 DIAGNOSIS — D689 Coagulation defect, unspecified: Secondary | ICD-10-CM | POA: Diagnosis not present

## 2023-09-18 DIAGNOSIS — D631 Anemia in chronic kidney disease: Secondary | ICD-10-CM | POA: Diagnosis not present

## 2023-09-18 DIAGNOSIS — N186 End stage renal disease: Secondary | ICD-10-CM | POA: Diagnosis not present

## 2023-09-18 DIAGNOSIS — N2581 Secondary hyperparathyroidism of renal origin: Secondary | ICD-10-CM | POA: Diagnosis not present

## 2023-09-18 DIAGNOSIS — Z992 Dependence on renal dialysis: Secondary | ICD-10-CM | POA: Diagnosis not present

## 2023-09-21 DIAGNOSIS — E440 Moderate protein-calorie malnutrition: Secondary | ICD-10-CM | POA: Diagnosis not present

## 2023-09-21 DIAGNOSIS — N2581 Secondary hyperparathyroidism of renal origin: Secondary | ICD-10-CM | POA: Diagnosis not present

## 2023-09-21 DIAGNOSIS — N186 End stage renal disease: Secondary | ICD-10-CM | POA: Diagnosis not present

## 2023-09-21 DIAGNOSIS — Z992 Dependence on renal dialysis: Secondary | ICD-10-CM | POA: Diagnosis not present

## 2023-09-21 DIAGNOSIS — R17 Unspecified jaundice: Secondary | ICD-10-CM | POA: Diagnosis not present

## 2023-09-21 DIAGNOSIS — E1129 Type 2 diabetes mellitus with other diabetic kidney complication: Secondary | ICD-10-CM | POA: Diagnosis not present

## 2023-09-21 DIAGNOSIS — D631 Anemia in chronic kidney disease: Secondary | ICD-10-CM | POA: Diagnosis not present

## 2023-09-21 DIAGNOSIS — D689 Coagulation defect, unspecified: Secondary | ICD-10-CM | POA: Diagnosis not present

## 2023-09-21 DIAGNOSIS — N2589 Other disorders resulting from impaired renal tubular function: Secondary | ICD-10-CM | POA: Diagnosis not present

## 2023-09-21 DIAGNOSIS — T8249XA Other complication of vascular dialysis catheter, initial encounter: Secondary | ICD-10-CM | POA: Diagnosis not present

## 2023-09-21 DIAGNOSIS — E878 Other disorders of electrolyte and fluid balance, not elsewhere classified: Secondary | ICD-10-CM | POA: Diagnosis not present

## 2023-09-21 DIAGNOSIS — Z79899 Other long term (current) drug therapy: Secondary | ICD-10-CM | POA: Diagnosis not present

## 2023-09-21 DIAGNOSIS — D509 Iron deficiency anemia, unspecified: Secondary | ICD-10-CM | POA: Diagnosis not present

## 2023-09-23 DIAGNOSIS — E878 Other disorders of electrolyte and fluid balance, not elsewhere classified: Secondary | ICD-10-CM | POA: Diagnosis not present

## 2023-09-23 DIAGNOSIS — T8249XA Other complication of vascular dialysis catheter, initial encounter: Secondary | ICD-10-CM | POA: Diagnosis not present

## 2023-09-23 DIAGNOSIS — N2581 Secondary hyperparathyroidism of renal origin: Secondary | ICD-10-CM | POA: Diagnosis not present

## 2023-09-23 DIAGNOSIS — N186 End stage renal disease: Secondary | ICD-10-CM | POA: Diagnosis not present

## 2023-09-23 DIAGNOSIS — R17 Unspecified jaundice: Secondary | ICD-10-CM | POA: Diagnosis not present

## 2023-09-23 DIAGNOSIS — Z79899 Other long term (current) drug therapy: Secondary | ICD-10-CM | POA: Diagnosis not present

## 2023-09-23 DIAGNOSIS — D509 Iron deficiency anemia, unspecified: Secondary | ICD-10-CM | POA: Diagnosis not present

## 2023-09-23 DIAGNOSIS — D631 Anemia in chronic kidney disease: Secondary | ICD-10-CM | POA: Diagnosis not present

## 2023-09-23 DIAGNOSIS — Z992 Dependence on renal dialysis: Secondary | ICD-10-CM | POA: Diagnosis not present

## 2023-09-23 DIAGNOSIS — D689 Coagulation defect, unspecified: Secondary | ICD-10-CM | POA: Diagnosis not present

## 2023-09-23 DIAGNOSIS — E440 Moderate protein-calorie malnutrition: Secondary | ICD-10-CM | POA: Diagnosis not present

## 2023-09-23 DIAGNOSIS — N2589 Other disorders resulting from impaired renal tubular function: Secondary | ICD-10-CM | POA: Diagnosis not present

## 2023-09-23 DIAGNOSIS — E1129 Type 2 diabetes mellitus with other diabetic kidney complication: Secondary | ICD-10-CM | POA: Diagnosis not present

## 2023-09-24 DIAGNOSIS — D631 Anemia in chronic kidney disease: Secondary | ICD-10-CM | POA: Diagnosis not present

## 2023-09-24 DIAGNOSIS — J45909 Unspecified asthma, uncomplicated: Secondary | ICD-10-CM | POA: Diagnosis not present

## 2023-09-24 DIAGNOSIS — E11621 Type 2 diabetes mellitus with foot ulcer: Secondary | ICD-10-CM | POA: Diagnosis not present

## 2023-09-24 DIAGNOSIS — E1122 Type 2 diabetes mellitus with diabetic chronic kidney disease: Secondary | ICD-10-CM | POA: Diagnosis not present

## 2023-09-24 DIAGNOSIS — I428 Other cardiomyopathies: Secondary | ICD-10-CM | POA: Diagnosis not present

## 2023-09-24 DIAGNOSIS — E1169 Type 2 diabetes mellitus with other specified complication: Secondary | ICD-10-CM | POA: Diagnosis not present

## 2023-09-24 DIAGNOSIS — E789 Disorder of lipoprotein metabolism, unspecified: Secondary | ICD-10-CM | POA: Diagnosis not present

## 2023-09-24 DIAGNOSIS — D689 Coagulation defect, unspecified: Secondary | ICD-10-CM | POA: Diagnosis not present

## 2023-09-24 DIAGNOSIS — N2581 Secondary hyperparathyroidism of renal origin: Secondary | ICD-10-CM | POA: Diagnosis not present

## 2023-09-24 DIAGNOSIS — L97521 Non-pressure chronic ulcer of other part of left foot limited to breakdown of skin: Secondary | ICD-10-CM | POA: Diagnosis not present

## 2023-09-24 DIAGNOSIS — N186 End stage renal disease: Secondary | ICD-10-CM | POA: Diagnosis not present

## 2023-09-24 DIAGNOSIS — K045 Chronic apical periodontitis: Secondary | ICD-10-CM | POA: Diagnosis not present

## 2023-09-24 DIAGNOSIS — D509 Iron deficiency anemia, unspecified: Secondary | ICD-10-CM | POA: Diagnosis not present

## 2023-09-24 DIAGNOSIS — Z992 Dependence on renal dialysis: Secondary | ICD-10-CM | POA: Diagnosis not present

## 2023-09-24 DIAGNOSIS — F4321 Adjustment disorder with depressed mood: Secondary | ICD-10-CM | POA: Diagnosis not present

## 2023-09-24 DIAGNOSIS — B964 Proteus (mirabilis) (morganii) as the cause of diseases classified elsewhere: Secondary | ICD-10-CM | POA: Diagnosis not present

## 2023-09-24 DIAGNOSIS — Z4781 Encounter for orthopedic aftercare following surgical amputation: Secondary | ICD-10-CM | POA: Diagnosis not present

## 2023-09-24 DIAGNOSIS — I351 Nonrheumatic aortic (valve) insufficiency: Secondary | ICD-10-CM | POA: Diagnosis not present

## 2023-09-24 DIAGNOSIS — I110 Hypertensive heart disease with heart failure: Secondary | ICD-10-CM | POA: Diagnosis not present

## 2023-09-24 DIAGNOSIS — M869 Osteomyelitis, unspecified: Secondary | ICD-10-CM | POA: Diagnosis not present

## 2023-09-24 DIAGNOSIS — K219 Gastro-esophageal reflux disease without esophagitis: Secondary | ICD-10-CM | POA: Diagnosis not present

## 2023-09-24 DIAGNOSIS — I2510 Atherosclerotic heart disease of native coronary artery without angina pectoris: Secondary | ICD-10-CM | POA: Diagnosis not present

## 2023-09-24 DIAGNOSIS — I5042 Chronic combined systolic (congestive) and diastolic (congestive) heart failure: Secondary | ICD-10-CM | POA: Diagnosis not present

## 2023-09-25 DIAGNOSIS — E1129 Type 2 diabetes mellitus with other diabetic kidney complication: Secondary | ICD-10-CM | POA: Diagnosis not present

## 2023-09-25 DIAGNOSIS — D689 Coagulation defect, unspecified: Secondary | ICD-10-CM | POA: Diagnosis not present

## 2023-09-25 DIAGNOSIS — N2589 Other disorders resulting from impaired renal tubular function: Secondary | ICD-10-CM | POA: Diagnosis not present

## 2023-09-25 DIAGNOSIS — Z992 Dependence on renal dialysis: Secondary | ICD-10-CM | POA: Diagnosis not present

## 2023-09-25 DIAGNOSIS — Z79899 Other long term (current) drug therapy: Secondary | ICD-10-CM | POA: Diagnosis not present

## 2023-09-25 DIAGNOSIS — R17 Unspecified jaundice: Secondary | ICD-10-CM | POA: Diagnosis not present

## 2023-09-25 DIAGNOSIS — N186 End stage renal disease: Secondary | ICD-10-CM | POA: Diagnosis not present

## 2023-09-25 DIAGNOSIS — N2581 Secondary hyperparathyroidism of renal origin: Secondary | ICD-10-CM | POA: Diagnosis not present

## 2023-09-25 DIAGNOSIS — E878 Other disorders of electrolyte and fluid balance, not elsewhere classified: Secondary | ICD-10-CM | POA: Diagnosis not present

## 2023-09-25 DIAGNOSIS — E440 Moderate protein-calorie malnutrition: Secondary | ICD-10-CM | POA: Diagnosis not present

## 2023-09-25 DIAGNOSIS — D509 Iron deficiency anemia, unspecified: Secondary | ICD-10-CM | POA: Diagnosis not present

## 2023-09-25 DIAGNOSIS — D631 Anemia in chronic kidney disease: Secondary | ICD-10-CM | POA: Diagnosis not present

## 2023-09-25 DIAGNOSIS — T8249XA Other complication of vascular dialysis catheter, initial encounter: Secondary | ICD-10-CM | POA: Diagnosis not present

## 2023-09-28 DIAGNOSIS — Z992 Dependence on renal dialysis: Secondary | ICD-10-CM | POA: Diagnosis not present

## 2023-09-28 DIAGNOSIS — D509 Iron deficiency anemia, unspecified: Secondary | ICD-10-CM | POA: Diagnosis not present

## 2023-09-28 DIAGNOSIS — T8249XA Other complication of vascular dialysis catheter, initial encounter: Secondary | ICD-10-CM | POA: Diagnosis not present

## 2023-09-28 DIAGNOSIS — N186 End stage renal disease: Secondary | ICD-10-CM | POA: Diagnosis not present

## 2023-09-28 DIAGNOSIS — D689 Coagulation defect, unspecified: Secondary | ICD-10-CM | POA: Diagnosis not present

## 2023-09-28 DIAGNOSIS — N2581 Secondary hyperparathyroidism of renal origin: Secondary | ICD-10-CM | POA: Diagnosis not present

## 2023-09-28 DIAGNOSIS — D631 Anemia in chronic kidney disease: Secondary | ICD-10-CM | POA: Diagnosis not present

## 2023-09-30 ENCOUNTER — Other Ambulatory Visit (HOSPITAL_COMMUNITY): Payer: Self-pay

## 2023-09-30 DIAGNOSIS — M869 Osteomyelitis, unspecified: Secondary | ICD-10-CM | POA: Diagnosis not present

## 2023-09-30 DIAGNOSIS — D631 Anemia in chronic kidney disease: Secondary | ICD-10-CM | POA: Diagnosis not present

## 2023-09-30 DIAGNOSIS — K045 Chronic apical periodontitis: Secondary | ICD-10-CM | POA: Diagnosis not present

## 2023-09-30 DIAGNOSIS — F4321 Adjustment disorder with depressed mood: Secondary | ICD-10-CM | POA: Diagnosis not present

## 2023-09-30 DIAGNOSIS — D509 Iron deficiency anemia, unspecified: Secondary | ICD-10-CM | POA: Diagnosis not present

## 2023-09-30 DIAGNOSIS — N2581 Secondary hyperparathyroidism of renal origin: Secondary | ICD-10-CM | POA: Diagnosis not present

## 2023-09-30 DIAGNOSIS — I5042 Chronic combined systolic (congestive) and diastolic (congestive) heart failure: Secondary | ICD-10-CM | POA: Diagnosis not present

## 2023-09-30 DIAGNOSIS — I428 Other cardiomyopathies: Secondary | ICD-10-CM | POA: Diagnosis not present

## 2023-09-30 DIAGNOSIS — E1122 Type 2 diabetes mellitus with diabetic chronic kidney disease: Secondary | ICD-10-CM | POA: Diagnosis not present

## 2023-09-30 DIAGNOSIS — L97521 Non-pressure chronic ulcer of other part of left foot limited to breakdown of skin: Secondary | ICD-10-CM | POA: Diagnosis not present

## 2023-09-30 DIAGNOSIS — J45909 Unspecified asthma, uncomplicated: Secondary | ICD-10-CM | POA: Diagnosis not present

## 2023-09-30 DIAGNOSIS — E789 Disorder of lipoprotein metabolism, unspecified: Secondary | ICD-10-CM | POA: Diagnosis not present

## 2023-09-30 DIAGNOSIS — B964 Proteus (mirabilis) (morganii) as the cause of diseases classified elsewhere: Secondary | ICD-10-CM | POA: Diagnosis not present

## 2023-09-30 DIAGNOSIS — D689 Coagulation defect, unspecified: Secondary | ICD-10-CM | POA: Diagnosis not present

## 2023-09-30 DIAGNOSIS — K219 Gastro-esophageal reflux disease without esophagitis: Secondary | ICD-10-CM | POA: Diagnosis not present

## 2023-09-30 DIAGNOSIS — Z4781 Encounter for orthopedic aftercare following surgical amputation: Secondary | ICD-10-CM | POA: Diagnosis not present

## 2023-09-30 DIAGNOSIS — Z992 Dependence on renal dialysis: Secondary | ICD-10-CM | POA: Diagnosis not present

## 2023-09-30 DIAGNOSIS — N186 End stage renal disease: Secondary | ICD-10-CM | POA: Diagnosis not present

## 2023-09-30 DIAGNOSIS — E1169 Type 2 diabetes mellitus with other specified complication: Secondary | ICD-10-CM | POA: Diagnosis not present

## 2023-09-30 DIAGNOSIS — T8249XA Other complication of vascular dialysis catheter, initial encounter: Secondary | ICD-10-CM | POA: Diagnosis not present

## 2023-09-30 DIAGNOSIS — E11621 Type 2 diabetes mellitus with foot ulcer: Secondary | ICD-10-CM | POA: Diagnosis not present

## 2023-09-30 DIAGNOSIS — I110 Hypertensive heart disease with heart failure: Secondary | ICD-10-CM | POA: Diagnosis not present

## 2023-09-30 DIAGNOSIS — I351 Nonrheumatic aortic (valve) insufficiency: Secondary | ICD-10-CM | POA: Diagnosis not present

## 2023-09-30 DIAGNOSIS — I2510 Atherosclerotic heart disease of native coronary artery without angina pectoris: Secondary | ICD-10-CM | POA: Diagnosis not present

## 2023-09-30 MED ORDER — SEVELAMER CARBONATE 800 MG PO TABS
2400.0000 mg | ORAL_TABLET | Freq: Three times a day (TID) | ORAL | 3 refills | Status: DC
  Filled 2023-09-30: qty 810, 90d supply, fill #0

## 2023-10-01 ENCOUNTER — Encounter: Payer: 59 | Admitting: Student

## 2023-10-01 ENCOUNTER — Encounter (HOSPITAL_COMMUNITY): Payer: Self-pay | Admitting: *Deleted

## 2023-10-01 ENCOUNTER — Other Ambulatory Visit (HOSPITAL_COMMUNITY): Payer: Self-pay

## 2023-10-01 DIAGNOSIS — N2581 Secondary hyperparathyroidism of renal origin: Secondary | ICD-10-CM | POA: Diagnosis not present

## 2023-10-01 DIAGNOSIS — E789 Disorder of lipoprotein metabolism, unspecified: Secondary | ICD-10-CM | POA: Diagnosis not present

## 2023-10-01 DIAGNOSIS — Z992 Dependence on renal dialysis: Secondary | ICD-10-CM | POA: Diagnosis not present

## 2023-10-01 DIAGNOSIS — M869 Osteomyelitis, unspecified: Secondary | ICD-10-CM | POA: Diagnosis not present

## 2023-10-01 DIAGNOSIS — K045 Chronic apical periodontitis: Secondary | ICD-10-CM | POA: Diagnosis not present

## 2023-10-01 DIAGNOSIS — L97521 Non-pressure chronic ulcer of other part of left foot limited to breakdown of skin: Secondary | ICD-10-CM | POA: Diagnosis not present

## 2023-10-01 DIAGNOSIS — D689 Coagulation defect, unspecified: Secondary | ICD-10-CM | POA: Diagnosis not present

## 2023-10-01 DIAGNOSIS — E1122 Type 2 diabetes mellitus with diabetic chronic kidney disease: Secondary | ICD-10-CM | POA: Diagnosis not present

## 2023-10-01 DIAGNOSIS — I110 Hypertensive heart disease with heart failure: Secondary | ICD-10-CM | POA: Diagnosis not present

## 2023-10-01 DIAGNOSIS — D509 Iron deficiency anemia, unspecified: Secondary | ICD-10-CM | POA: Diagnosis not present

## 2023-10-01 DIAGNOSIS — J45909 Unspecified asthma, uncomplicated: Secondary | ICD-10-CM | POA: Diagnosis not present

## 2023-10-01 DIAGNOSIS — I5042 Chronic combined systolic (congestive) and diastolic (congestive) heart failure: Secondary | ICD-10-CM | POA: Diagnosis not present

## 2023-10-01 DIAGNOSIS — I2510 Atherosclerotic heart disease of native coronary artery without angina pectoris: Secondary | ICD-10-CM | POA: Diagnosis not present

## 2023-10-01 DIAGNOSIS — K219 Gastro-esophageal reflux disease without esophagitis: Secondary | ICD-10-CM | POA: Diagnosis not present

## 2023-10-01 DIAGNOSIS — N186 End stage renal disease: Secondary | ICD-10-CM | POA: Diagnosis not present

## 2023-10-01 DIAGNOSIS — B964 Proteus (mirabilis) (morganii) as the cause of diseases classified elsewhere: Secondary | ICD-10-CM | POA: Diagnosis not present

## 2023-10-01 DIAGNOSIS — D631 Anemia in chronic kidney disease: Secondary | ICD-10-CM | POA: Diagnosis not present

## 2023-10-01 DIAGNOSIS — I428 Other cardiomyopathies: Secondary | ICD-10-CM | POA: Diagnosis not present

## 2023-10-01 DIAGNOSIS — Z4781 Encounter for orthopedic aftercare following surgical amputation: Secondary | ICD-10-CM | POA: Diagnosis not present

## 2023-10-01 DIAGNOSIS — E1169 Type 2 diabetes mellitus with other specified complication: Secondary | ICD-10-CM | POA: Diagnosis not present

## 2023-10-01 DIAGNOSIS — E11621 Type 2 diabetes mellitus with foot ulcer: Secondary | ICD-10-CM | POA: Diagnosis not present

## 2023-10-01 DIAGNOSIS — F4321 Adjustment disorder with depressed mood: Secondary | ICD-10-CM | POA: Diagnosis not present

## 2023-10-01 DIAGNOSIS — I351 Nonrheumatic aortic (valve) insufficiency: Secondary | ICD-10-CM | POA: Diagnosis not present

## 2023-10-02 DIAGNOSIS — Z992 Dependence on renal dialysis: Secondary | ICD-10-CM | POA: Diagnosis not present

## 2023-10-02 DIAGNOSIS — T8249XA Other complication of vascular dialysis catheter, initial encounter: Secondary | ICD-10-CM | POA: Diagnosis not present

## 2023-10-02 DIAGNOSIS — N2581 Secondary hyperparathyroidism of renal origin: Secondary | ICD-10-CM | POA: Diagnosis not present

## 2023-10-02 DIAGNOSIS — D509 Iron deficiency anemia, unspecified: Secondary | ICD-10-CM | POA: Diagnosis not present

## 2023-10-02 DIAGNOSIS — D631 Anemia in chronic kidney disease: Secondary | ICD-10-CM | POA: Diagnosis not present

## 2023-10-02 DIAGNOSIS — N186 End stage renal disease: Secondary | ICD-10-CM | POA: Diagnosis not present

## 2023-10-02 DIAGNOSIS — D689 Coagulation defect, unspecified: Secondary | ICD-10-CM | POA: Diagnosis not present

## 2023-10-07 ENCOUNTER — Telehealth: Payer: Self-pay

## 2023-10-07 DIAGNOSIS — E789 Disorder of lipoprotein metabolism, unspecified: Secondary | ICD-10-CM | POA: Diagnosis not present

## 2023-10-07 DIAGNOSIS — E1122 Type 2 diabetes mellitus with diabetic chronic kidney disease: Secondary | ICD-10-CM | POA: Diagnosis not present

## 2023-10-07 DIAGNOSIS — N186 End stage renal disease: Secondary | ICD-10-CM | POA: Diagnosis not present

## 2023-10-07 DIAGNOSIS — F4321 Adjustment disorder with depressed mood: Secondary | ICD-10-CM | POA: Diagnosis not present

## 2023-10-07 DIAGNOSIS — I428 Other cardiomyopathies: Secondary | ICD-10-CM | POA: Diagnosis not present

## 2023-10-07 DIAGNOSIS — E11621 Type 2 diabetes mellitus with foot ulcer: Secondary | ICD-10-CM | POA: Diagnosis not present

## 2023-10-07 DIAGNOSIS — Z4781 Encounter for orthopedic aftercare following surgical amputation: Secondary | ICD-10-CM | POA: Diagnosis not present

## 2023-10-07 DIAGNOSIS — D631 Anemia in chronic kidney disease: Secondary | ICD-10-CM | POA: Diagnosis not present

## 2023-10-07 DIAGNOSIS — Z992 Dependence on renal dialysis: Secondary | ICD-10-CM | POA: Diagnosis not present

## 2023-10-07 DIAGNOSIS — K045 Chronic apical periodontitis: Secondary | ICD-10-CM | POA: Diagnosis not present

## 2023-10-07 DIAGNOSIS — N2581 Secondary hyperparathyroidism of renal origin: Secondary | ICD-10-CM | POA: Diagnosis not present

## 2023-10-07 DIAGNOSIS — I351 Nonrheumatic aortic (valve) insufficiency: Secondary | ICD-10-CM | POA: Diagnosis not present

## 2023-10-07 DIAGNOSIS — D509 Iron deficiency anemia, unspecified: Secondary | ICD-10-CM | POA: Diagnosis not present

## 2023-10-07 DIAGNOSIS — M869 Osteomyelitis, unspecified: Secondary | ICD-10-CM

## 2023-10-07 DIAGNOSIS — B964 Proteus (mirabilis) (morganii) as the cause of diseases classified elsewhere: Secondary | ICD-10-CM | POA: Diagnosis not present

## 2023-10-07 DIAGNOSIS — I2510 Atherosclerotic heart disease of native coronary artery without angina pectoris: Secondary | ICD-10-CM | POA: Diagnosis not present

## 2023-10-07 DIAGNOSIS — L97521 Non-pressure chronic ulcer of other part of left foot limited to breakdown of skin: Secondary | ICD-10-CM | POA: Diagnosis not present

## 2023-10-07 DIAGNOSIS — I110 Hypertensive heart disease with heart failure: Secondary | ICD-10-CM | POA: Diagnosis not present

## 2023-10-07 DIAGNOSIS — I5042 Chronic combined systolic (congestive) and diastolic (congestive) heart failure: Secondary | ICD-10-CM | POA: Diagnosis not present

## 2023-10-07 DIAGNOSIS — K219 Gastro-esophageal reflux disease without esophagitis: Secondary | ICD-10-CM | POA: Diagnosis not present

## 2023-10-07 DIAGNOSIS — E1169 Type 2 diabetes mellitus with other specified complication: Secondary | ICD-10-CM | POA: Diagnosis not present

## 2023-10-07 DIAGNOSIS — D689 Coagulation defect, unspecified: Secondary | ICD-10-CM | POA: Diagnosis not present

## 2023-10-07 DIAGNOSIS — J45909 Unspecified asthma, uncomplicated: Secondary | ICD-10-CM | POA: Diagnosis not present

## 2023-10-07 NOTE — Telephone Encounter (Signed)
Return call to [facility] CM who stated pt is constantly gone; they try to see pt on T/Th (dialysis on other days). Stated yesterday pt yelled at her; stated he's not in prison, he can go as he pleases. And since he's constantly not at home, he needs to go to the Wound Clinic.

## 2023-10-07 NOTE — Telephone Encounter (Signed)
Mirica, RN with [facility] states pt has not been available to do wound care. Pt needs to go to wound care clinic for fu visit. States due to missed visits for wound care, [facility] will discharged pt.

## 2023-10-08 ENCOUNTER — Other Ambulatory Visit (HOSPITAL_COMMUNITY): Payer: Self-pay

## 2023-10-09 DIAGNOSIS — E1129 Type 2 diabetes mellitus with other diabetic kidney complication: Secondary | ICD-10-CM | POA: Diagnosis not present

## 2023-10-09 DIAGNOSIS — N186 End stage renal disease: Secondary | ICD-10-CM | POA: Diagnosis not present

## 2023-10-09 DIAGNOSIS — Z992 Dependence on renal dialysis: Secondary | ICD-10-CM | POA: Diagnosis not present

## 2023-10-12 ENCOUNTER — Encounter (HOSPITAL_COMMUNITY): Payer: Self-pay | Admitting: *Deleted

## 2023-10-12 ENCOUNTER — Emergency Department (HOSPITAL_COMMUNITY): Payer: 59

## 2023-10-12 ENCOUNTER — Other Ambulatory Visit: Payer: Self-pay

## 2023-10-12 ENCOUNTER — Other Ambulatory Visit (HOSPITAL_COMMUNITY): Payer: Self-pay

## 2023-10-12 ENCOUNTER — Emergency Department (HOSPITAL_COMMUNITY)
Admission: EM | Admit: 2023-10-12 | Discharge: 2023-10-12 | Disposition: A | Discharge status: Home / Self Care | Payer: 59 | Attending: Emergency Medicine | Admitting: Emergency Medicine

## 2023-10-12 DIAGNOSIS — Z20822 Contact with and (suspected) exposure to covid-19: Secondary | ICD-10-CM | POA: Insufficient documentation

## 2023-10-12 DIAGNOSIS — J101 Influenza due to other identified influenza virus with other respiratory manifestations: Secondary | ICD-10-CM | POA: Diagnosis not present

## 2023-10-12 DIAGNOSIS — N2581 Secondary hyperparathyroidism of renal origin: Secondary | ICD-10-CM | POA: Diagnosis not present

## 2023-10-12 DIAGNOSIS — R0602 Shortness of breath: Secondary | ICD-10-CM | POA: Diagnosis present

## 2023-10-12 DIAGNOSIS — D689 Coagulation defect, unspecified: Secondary | ICD-10-CM | POA: Diagnosis not present

## 2023-10-12 DIAGNOSIS — N186 End stage renal disease: Secondary | ICD-10-CM | POA: Diagnosis not present

## 2023-10-12 DIAGNOSIS — R0989 Other specified symptoms and signs involving the circulatory and respiratory systems: Secondary | ICD-10-CM | POA: Diagnosis not present

## 2023-10-12 DIAGNOSIS — T8249XA Other complication of vascular dialysis catheter, initial encounter: Secondary | ICD-10-CM | POA: Diagnosis not present

## 2023-10-12 DIAGNOSIS — J9811 Atelectasis: Secondary | ICD-10-CM | POA: Diagnosis not present

## 2023-10-12 DIAGNOSIS — Z794 Long term (current) use of insulin: Secondary | ICD-10-CM | POA: Insufficient documentation

## 2023-10-12 DIAGNOSIS — J811 Chronic pulmonary edema: Secondary | ICD-10-CM | POA: Diagnosis not present

## 2023-10-12 DIAGNOSIS — Z992 Dependence on renal dialysis: Secondary | ICD-10-CM | POA: Diagnosis not present

## 2023-10-12 LAB — CBC
HCT: 35.6 % — ABNORMAL LOW (ref 39.0–52.0)
Hemoglobin: 11.1 g/dL — ABNORMAL LOW (ref 13.0–17.0)
MCH: 27.4 pg (ref 26.0–34.0)
MCHC: 31.2 g/dL (ref 30.0–36.0)
MCV: 87.9 fL (ref 80.0–100.0)
Platelets: 180 10*3/uL (ref 150–400)
RBC: 4.05 MIL/uL — ABNORMAL LOW (ref 4.22–5.81)
RDW: 21.1 % — ABNORMAL HIGH (ref 11.5–15.5)
WBC: 10.3 10*3/uL (ref 4.0–10.5)
nRBC: 0 % (ref 0.0–0.2)

## 2023-10-12 LAB — BASIC METABOLIC PANEL
Anion gap: 20 — ABNORMAL HIGH (ref 5–15)
BUN: 72 mg/dL — ABNORMAL HIGH (ref 6–20)
CO2: 19 mmol/L — ABNORMAL LOW (ref 22–32)
Calcium: 9.2 mg/dL (ref 8.9–10.3)
Chloride: 100 mmol/L (ref 98–111)
Creatinine, Ser: 13.37 mg/dL — ABNORMAL HIGH (ref 0.61–1.24)
GFR, Estimated: 4 mL/min — ABNORMAL LOW (ref >60)
Glucose, Bld: 173 mg/dL — ABNORMAL HIGH (ref 70–99)
Potassium: 4.1 mmol/L (ref 3.5–5.1)
Sodium: 139 mmol/L (ref 135–145)

## 2023-10-12 LAB — RESP PANEL BY RT-PCR (RSV, FLU A&B, COVID)  RVPGX2
Influenza A by PCR: POSITIVE — AB
Influenza B by PCR: NEGATIVE
Resp Syncytial Virus by PCR: NEGATIVE
SARS Coronavirus 2 by RT PCR: NEGATIVE

## 2023-10-12 LAB — TROPONIN I (HIGH SENSITIVITY)
Troponin I (High Sensitivity): 45 ng/L — ABNORMAL HIGH (ref <18)
Troponin I (High Sensitivity): 44 ng/L — ABNORMAL HIGH (ref <18)

## 2023-10-12 MED ORDER — OSELTAMIVIR PHOSPHATE 30 MG PO CAPS
ORAL_CAPSULE | ORAL | 0 refills | Status: DC
  Filled 2023-10-12: qty 3, 3d supply, fill #0

## 2023-10-12 MED ORDER — IPRATROPIUM-ALBUTEROL 0.5-2.5 (3) MG/3ML IN SOLN
3.0000 mL | Freq: Once | RESPIRATORY_TRACT | Status: AC
  Administered 2023-10-12: 3 mL via RESPIRATORY_TRACT
  Filled 2023-10-12: qty 3

## 2023-10-12 MED ORDER — ALBUTEROL SULFATE HFA 108 (90 BASE) MCG/ACT IN AERS: 2.0000 | INHALATION_SPRAY | RESPIRATORY_TRACT | Status: DC | PRN

## 2023-10-12 MED ORDER — OSELTAMIVIR PHOSPHATE 75 MG PO CAPS
75.0000 mg | ORAL_CAPSULE | Freq: Once | ORAL | Status: AC
  Administered 2023-10-12: 75 mg via ORAL
  Filled 2023-10-12: qty 1

## 2023-10-12 NOTE — ED Provider Notes (Signed)
[facility]GENC[facility] DE[facility]MENT AT [facility] Provider Note   CSN: [id number] Arrival date & time: 10/12/23  2595     History  Chief Complaint  Patient presents with   Shortness of Breath    Jeremy [name] is a 43 y.o. male.  Patient presents to the emerged department for evaluation of shortness of breath that has been ongoing the last couple of days.  Patient is a dialysis patient, did have full dialysis on Friday.       Home Medications Prior to Admission medications   Medication Sig Start Date End Date Taking? Authorizing Provider  oseltamivir (TAMIFLU) 30 MG capsule Take one tablet daily after dialysis 10/12/23  Yes Darwin Rothlisberger, Canary Brim, MD  acetaminophen (TYLENOL) 500 MG tablet Take 1,000 mg by mouth every 6 (six) hours as needed for moderate pain.    [provider]  amLODipine (NORVASC) 10 MG tablet Take 1 tablet (10 mg total) by mouth daily. Patient taking differently: Take 10 mg by mouth at bedtime. 01/10/22   Tyson Alias, MD  amoxicillin-clavulanate (AUGMENTIN) 500-125 MG tablet Take 1 tablet by mouth in the morning and at bedtime. Start first dose on night of 12/30. 09/07/23   Katheran James, DO  amoxicillin-clavulanate (AUGMENTIN) 500-125 MG tablet Take 1 tablet by mouth daily. 09/17/23   Standiford, Jenelle Mages, DPM  atorvastatin (LIPITOR) 80 MG tablet Take 1 tablet (80 mg total) by mouth daily. 01/06/23   Bensimhon, Bevelyn Buckles, MD  carvedilol (COREG) 25 MG tablet Take 2 tablets (50 mg total) by mouth 2 (two) times daily. 11/10/22   Laurey Morale, MD  cloNIDine (CATAPRES) 0.3 MG tablet Take 1 tablet (0.3 mg total) by mouth 2 (two) times daily as directed. 04/10/23     ferric citrate (AURYXIA) 1 GM 210 MG(Fe) tablet Take 2 tablets (420 mg total) by mouth with each meal and 2 tablets (420 mg total) with snacks. 10/14/22     hydrALAZINE (APRESOLINE) 50 MG tablet Take 1 tablet (50 mg total) by mouth 3 (three) times daily. 06/16/23     insulin  aspart (NOVOLOG FLEXPEN) 100 UNIT/ML FlexPen Inject 15 Units into the skin 3 (three) times daily with meals. Patient taking differently: Inject 17 Units into the skin 3 (three) times daily with meals. 07/07/23     insulin glargine-yfgn (SEMGLEE, YFGN,) 100 UNIT/ML Pen Inject 20 Units into the skin at bedtime. 09/19/22   Masters, Florentina Addison, DO  multivitamin (RENA-VIT) TABS tablet Take 1 tablet by mouth daily.    [provider]  nitroGLYCERIN (NITROSTAT) 0.4 MG SL tablet Place 1 tablet (0.4 mg total) under the tongue every 5 (five) minutes x 3 doses as needed within 15 minutes. If chest pain does not resolve, seek medical attention. 07/23/23     ondansetron (ZOFRAN) 4 MG tablet Take 1 tablet by mouth once a day as needed Patient taking differently: Take 4 mg by mouth 3 (three) times a week. 07/23/23     pantoprazole (PROTONIX) 20 MG tablet Take 1 tablet (20 mg total) by mouth daily as directed for reflux. 07/23/23     RENVELA 800 MG tablet Take 800 mg by mouth 3 (three) times daily with meals. 07/28/21   [provider]  sevelamer carbonate (RENVELA) 800 MG tablet Take 3 tablets (2,400 mg total) by mouth 3 (three) times daily with meals 09/30/23         Allergies    Acyclovir and related and Imdur [isosorbide nitrate]    Review of  Systems   Review of Systems  Physical Exam Updated Vital Signs BP (!) 134/47   Pulse 81   Temp 97.7 F (36.5 C)   Resp 13   Ht 6\' 5"  (1.956 m)   Wt 107 kg   SpO2 100%   BMI 27.97 kg/m  Physical Exam Vitals and nursing note reviewed.  Constitutional:      General: He is not in acute distress.    Appearance: He is well-developed.  HENT:     Head: Normocephalic and atraumatic.     Mouth/Throat:     Mouth: Mucous membranes are moist.  Eyes:     General: Vision grossly intact. Gaze aligned appropriately.     Extraocular Movements: Extraocular movements intact.     Conjunctiva/sclera: Conjunctivae normal.  Cardiovascular:     Rate and  Rhythm: Normal rate and regular rhythm.     Pulses: Normal pulses.     Heart sounds: Normal heart sounds, S1 normal and S2 normal. No murmur heard.    No friction rub. No gallop.  Pulmonary:     Effort: Pulmonary effort is normal. No respiratory distress.     Breath sounds: Decreased breath sounds and wheezing (End expiratory) present.  Abdominal:     Palpations: Abdomen is soft.     Tenderness: There is no abdominal tenderness. There is no guarding or rebound.     Hernia: No hernia is present.  Musculoskeletal:        General: No swelling.     Cervical back: Full passive range of motion without pain, normal range of motion and neck supple. No pain with movement, spinous process tenderness or muscular tenderness. Normal range of motion.     Right lower leg: No edema.     Left lower leg: No edema.  Skin:    General: Skin is warm and dry.     Capillary Refill: Capillary refill takes less than 2 seconds.     Findings: No ecchymosis, erythema, lesion or wound.  Neurological:     Mental Status: He is alert and oriented to person, place, and time.     GCS: GCS eye subscore is 4. GCS verbal subscore is 5. GCS motor subscore is 6.     Cranial Nerves: Cranial nerves 2-12 are intact.     Sensory: Sensation is intact.     Motor: Motor function is intact. No weakness or abnormal muscle tone.     Coordination: Coordination is intact.  Psychiatric:        Mood and Affect: Mood normal.        Speech: Speech normal.        Behavior: Behavior normal.     ED Results / Procedures / Treatments   Labs (all labs ordered are listed, but only abnormal results are displayed) Labs Reviewed  RESP PANEL BY RT-PCR (RSV, FLU A&B, COVID)  RVPGX2 - Abnormal; Notable for the following components:      Result Value   Influenza A by PCR POSITIVE (*)    All other components within normal limits  BASIC METABOLIC PANEL - Abnormal; Notable for the following components:   CO2 19 (*)    Glucose, Bld 173 (*)     BUN 72 (*)    Creatinine, Ser 13.37 (*)    GFR, Estimated 4 (*)    Anion gap 20 (*)    All other components within normal limits  CBC - Abnormal; Notable for the following components:   RBC 4.05 (*)  Hemoglobin 11.1 (*)    HCT 35.6 (*)    RDW 21.1 (*)    All other components within normal limits  TROPONIN I (HIGH SENSITIVITY) - Abnormal; Notable for the following components:   Troponin I (High Sensitivity) 45 (*)    All other components within normal limits  TROPONIN I (HIGH SENSITIVITY)    EKG EKG Interpretation Date/Time:  Monday October 12 2023 02:32:39 EST Ventricular Rate:  90 PR Interval:  152 QRS Duration:  98 QT Interval:  400 QTC Calculation: 489 R Axis:   -35  Text Interpretation: Normal sinus rhythm with sinus arrhythmia Left axis deviation ST & T wave abnormality, consider lateral ischemia Prolonged QT Abnormal ECG When compared with ECG of 05-Sep-2023 17:36, No significant change since last tracing Confirmed by Gilda Crease [phone]) on 10/12/2023 4:22:45 AM  Radiology DG Chest 2 View Result Date: 10/12/2023 CLINICAL DATA:  Shortness of breath for 2 days EXAM: CHEST - 2 VIEW COMPARISON:  09/03/2023 FINDINGS: Stable cardiomediastinal silhouette. Left IJ CVC tip in the right atrium. Pulmonary vascular congestion. Interstitial coarsening in the lower lungs. Right basilar atelectasis. No pleural effusion or pneumothorax. IMPRESSION: Pulmonary vascular congestion and mild interstitial edema. Electronically Signed   By: Minerva Fester M.D.   On: 10/12/2023 03:08    Procedures Procedures    Medications Ordered in ED Medications  oseltamivir (TAMIFLU) capsule 75 mg (75 mg Oral Given 10/12/23 0433)  ipratropium-albuterol (DUONEB) 0.5-2.5 (3) MG/3ML nebulizer solution 3 mL (3 mLs Nebulization Given 10/12/23 0434)    ED Course/ Medical Decision Making/ A&P                                 Medical Decision Making Amount and/or Complexity of Data Reviewed Labs:  ordered. Radiology: ordered.  Risk Prescription drug management.   Differential Diagnosis considered includes, but not limited to: COVID-19; influenza; RSV; simple viral URI; pneumonia; chf  Patient who is end-stage renal on dialysis presents to the emergency department with 2 days of progressively worsening shortness of breath.  Patient without chest pain.  Patient did complete his dialysis on Friday.  Patient's lab work reveals likely slight uremia but no electrolyte abnormality.  No significant anemia from his baseline.  Chest x-ray does not show significant volume overload.  Patient did test positive for flu which is likely the cause of his symptoms.  He did have some decreased air movement bilaterally and some slight wheezing.  Treat with Tamiflu, bronchodilator therapy.  Does not require hospitalization, oxygen saturations are 100% on room air.        Final Clinical Impression(s) / ED Diagnoses Final diagnoses:  Influenza A    Rx / DC Orders ED Discharge Orders          Ordered    oseltamivir (TAMIFLU) 30 MG capsule        10/12/23 0440              Gilda Crease, MD 10/12/23 [phone]

## 2023-10-12 NOTE — ED Triage Notes (Signed)
The pt is c/o shortness of breath  for approx 2-3 days    dialysis pt  last dialyzed Friday  he has a lt upper chest dialysis catheter

## 2023-10-13 ENCOUNTER — Other Ambulatory Visit: Payer: Self-pay | Admitting: *Deleted

## 2023-10-13 DIAGNOSIS — N186 End stage renal disease: Secondary | ICD-10-CM

## 2023-10-14 ENCOUNTER — Other Ambulatory Visit (HOSPITAL_COMMUNITY): Payer: Self-pay

## 2023-10-14 DIAGNOSIS — Z992 Dependence on renal dialysis: Secondary | ICD-10-CM | POA: Diagnosis not present

## 2023-10-14 DIAGNOSIS — N186 End stage renal disease: Secondary | ICD-10-CM | POA: Diagnosis not present

## 2023-10-14 DIAGNOSIS — T8249XA Other complication of vascular dialysis catheter, initial encounter: Secondary | ICD-10-CM | POA: Diagnosis not present

## 2023-10-14 DIAGNOSIS — N2581 Secondary hyperparathyroidism of renal origin: Secondary | ICD-10-CM | POA: Diagnosis not present

## 2023-10-14 DIAGNOSIS — D689 Coagulation defect, unspecified: Secondary | ICD-10-CM | POA: Diagnosis not present

## 2023-10-14 MED ORDER — ONDANSETRON HCL 4 MG PO TABS
4.0000 mg | ORAL_TABLET | Freq: Every day | ORAL | 3 refills | Status: DC | PRN
  Filled 2023-10-14: qty 34, 34d supply, fill #0
  Filled 2023-11-06: qty 30, 30d supply, fill #1

## 2023-10-15 ENCOUNTER — Ambulatory Visit: Payer: 59 | Admitting: Internal Medicine

## 2023-10-15 ENCOUNTER — Other Ambulatory Visit: Payer: Self-pay

## 2023-10-15 ENCOUNTER — Encounter (HOSPITAL_COMMUNITY): Payer: Self-pay | Admitting: *Deleted

## 2023-10-15 ENCOUNTER — Other Ambulatory Visit (HOSPITAL_COMMUNITY): Payer: Self-pay

## 2023-10-15 VITALS — BP 127/57 | HR 56 | Temp 98.3°F | Ht 77.0 in | Wt 254.1 lb

## 2023-10-15 DIAGNOSIS — Z992 Dependence on renal dialysis: Secondary | ICD-10-CM | POA: Diagnosis not present

## 2023-10-15 DIAGNOSIS — E1122 Type 2 diabetes mellitus with diabetic chronic kidney disease: Secondary | ICD-10-CM

## 2023-10-15 DIAGNOSIS — I10 Essential (primary) hypertension: Secondary | ICD-10-CM

## 2023-10-15 DIAGNOSIS — Z794 Long term (current) use of insulin: Secondary | ICD-10-CM

## 2023-10-15 DIAGNOSIS — E1121 Type 2 diabetes mellitus with diabetic nephropathy: Secondary | ICD-10-CM

## 2023-10-15 DIAGNOSIS — R0602 Shortness of breath: Secondary | ICD-10-CM

## 2023-10-15 DIAGNOSIS — Z89422 Acquired absence of other left toe(s): Secondary | ICD-10-CM | POA: Diagnosis not present

## 2023-10-15 DIAGNOSIS — N186 End stage renal disease: Secondary | ICD-10-CM

## 2023-10-15 MED ORDER — ACCU-CHEK SOFTCLIX LANCETS MISC
Freq: Four times a day (QID) | 0 refills | Status: AC
  Filled 2023-10-15: qty 100, 25d supply, fill #0

## 2023-10-15 MED ORDER — HYDRALAZINE HCL 50 MG PO TABS
50.0000 mg | ORAL_TABLET | Freq: Three times a day (TID) | ORAL | 3 refills | Status: DC
  Filled 2023-10-15 (×2): qty 270, 90d supply, fill #0
  Filled 2023-10-31: qty 90, 30d supply, fill #1

## 2023-10-15 MED ORDER — BLOOD GLUCOSE MONITOR KIT
PACK | Freq: Four times a day (QID) | 0 refills | Status: AC
  Filled 2023-10-15: qty 1, 30d supply, fill #0
  Filled 2023-10-15: qty 1, fill #0

## 2023-10-15 MED ORDER — ACCU-CHEK GUIDE TEST VI STRP
ORAL_STRIP | Freq: Four times a day (QID) | 0 refills | Status: AC
  Filled 2023-10-15: qty 100, 25d supply, fill #0

## 2023-10-15 MED ORDER — CARVEDILOL 25 MG PO TABS
50.0000 mg | ORAL_TABLET | Freq: Two times a day (BID) | ORAL | 11 refills | Status: AC
  Filled 2023-10-15: qty 120, 30d supply, fill #0
  Filled 2023-10-31: qty 120, 30d supply, fill #1
  Filled 2023-12-01: qty 120, 30d supply, fill #2
  Filled 2024-03-25: qty 120, 30d supply, fill #3

## 2023-10-15 NOTE — Assessment & Plan Note (Signed)
 Blood pressure well-controlled at 127/57.  Following hospital discharge amlodipine  and clonidine  were held.  He is currently taking hydralazine  50 mg 3 times daily and carvedilol  25 mg twice daily and reports adherence. P: With blood pressure being well-controlled will move amlodipine  and clonidine  from his med list Continue hydralazine  50 mg 3 times daily and carvedilol  25 mg twice daily

## 2023-10-15 NOTE — Patient Instructions (Addendum)
 Thank you, Jeremy [name] for allowing us  to provide your care today.   Foot wound You are overdo for follow-up with your podiatrist. Please call his office today to make follow-up. I have listed their number below. Phone: [phone]  Shortness of breath I think that your symptoms are from recovering from the flu. It will take some time for your body to recover from this. Your weight is up today and you should let your dialysis center know that as they may want to remove more fluids tomorrow.  I have ordered the following medication/changed the following medications:   Stop the following medications: Medications Discontinued During This Encounter  Medication Reason   oseltamivir  (TAMIFLU ) 30 MG capsule Completed Course   cloNIDine  (CATAPRES ) 0.3 MG tablet Discontinued by provider   amoxicillin -clavulanate (AUGMENTIN ) 500-125 MG tablet Discontinued by provider   amoxicillin -clavulanate (AUGMENTIN ) 500-125 MG tablet Completed Course   amLODipine  (NORVASC ) 10 MG tablet Discontinued by provider     Start the following medications: Meds ordered this encounter  Medications   blood glucose meter kit and supplies KIT    Sig: Dispense based on patient and insurance preference. Use up to four times daily as directed.    Dispense:  1 each    Refill:  0    Number of strips:   100    Number of lancets:   100     Follow up: 1 month  We look forward to seeing you next time. Please call our clinic at [phone] if you have any questions or concerns. The best time to call is Monday-Friday from 9am-4pm, but there is someone available 24/7. If after hours or the weekend, call the main hospital number and ask for the Internal Medicine Resident On-Call. If you need medication refills, please notify your pharmacy one week in advance and they will send us  a request.   Thank you for trusting me with your care. Wishing you the best!   Izetta Medley, DO [facility]

## 2023-10-15 NOTE — Assessment & Plan Note (Signed)
 He was admitted due to osteomyelitis of left fourth toe.  He underwent amputation of third and fourth ray on right foot December 28.  He followed up with podiatry in January and wound dehiscence was noted.  He has had a home health agent come out twice a week to have dressing changed.  He has missed follow-up with podiatry by about 2 weeks. P: Home health agent to go to home tomorrow to change dressing I sent a message to his podiatrist Dr. Malvin to help coordinate follow-up.  Phone number for [facility] also provided on after visit summary

## 2023-10-15 NOTE — Progress Notes (Signed)
 Subjective:  CC: Hospital follow-up  HPI:  [name] is a 43 y.o. 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   He was admitted from December 26 through December 30 for osteomyelitis of his left fourth toe.  He had amputation of his right fourth toe December 2.  He received third and fourth ray amputation of his left foot December 28.  He was to finish antibiotics with Augmentin  when op cultures grew Proteus.  Of note he was also seen in the emergency room February 3 for shortness of breath and found to have influenza A.  He received Tamiflu .  Weight is up from 235 lb to 254 lbs. He has been going to HD regularly.  Please see problem based assessment and plan for additional details.  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 01/2015 Lexiscan  MV: EF 28%, inferior, inferolateral, apical ischemia;  b. 01/2015 Cath: nl cors, PCWP 18 mmHg, CO 9.38 L/min, CI 3.53 L/min/m^2.   CHF (congestive heart failure) (HCC)    Depression    Situational   ESRD (end stage renal disease) (HCC)    [facility] [place]   Essential hypertension    Family history of adverse reaction to anesthesia    sister- it was too much for her heart died   Flash pulmonary edema (HCC) 09/18/2022   GERD (gastroesophageal reflux disease)    Hyperlipidemia    Membranous glomerulonephritis    Morbid obesity (HCC)    Nonischemic cardiomyopathy (HCC)    a. 01/2015 Echo: EF 20-25%, diff HK, Gr 2 DD, Triv AI, mildly dil LA and Ao root.   PONV (postoperative nausea and vomiting)    Seizures (HCC) 02/29/2020   electralytes were out of wack.   Type II diabetes mellitus (HCC)    a. 01/2015 HbA1c = 8.9.    MEDICATIONS:   Atorvastatin  80 mg Auryxia  210 mg Coreg  25 mg BID Hydalazine 50 mg TID Glargine 20 units at bedtime Novolog  17 units TID Pantoprazole  20 mg Renvala 800 mg TID  STOPPED: Augmentin  Amlodipine   clonidine   Family History  Problem Relation Age of Onset   Diabetes Mellitus II Mother        died @ 60.   Gastric cancer Mother    CAD Father        died @ 27.   Heart attack Father    Congestive Heart Failure Father    Diabetes Mellitus II Sister    CAD Sister        s/p PCI - age 64.    Past Surgical History:  Procedure Laterality Date   A/V FISTULAGRAM N/A 08/12/2021   Procedure: A/V FISTULAGRAM;  Surgeon: Sheree Penne Bruckner, MD;  Location: [facility] INVASIVE CV LAB;  Service: Cardiovascular;  Laterality: N/A;   AV FISTULA PLACEMENT Right 12/18/2020   Procedure: RIGHT ARM RADIOCEPHALIC  ARTERIOVENOUS (AV) FISTULA CREATION;  Surgeon: Sheree Penne Bruckner, MD;  Location: [facility] OR;  Service: Vascular;  Laterality: Right;   CARDIAC CATHETERIZATION N/A 02/01/2015   Procedure: Right/Left Heart Cath and Coronary Angiography;  Surgeon: Maude JAYSON Emmer, MD;  Location: [facility] INVASIVE CV LAB;  Service: Cardiovascular;  Laterality: N/A;  INCISION AND DRAINAGE Left 09/05/2023   Procedure: INCISION AND DRAINAGE WITH 3RD TOE METATARSAL RESECTION LEFT FOOT AND 4TH TOE PROXIMAL PHALANX RESECTION;  Surgeon: Malvin Marsa FALCON, DPM;  Location: [facility] OR;  Service: Orthopedics/Podiatry;  Laterality: Left;  I&D with possible metatarsal resection, left foot   IR FLUORO GUIDE CV LINE LEFT  07/21/2022   IR FLUORO GUIDE CV LINE LEFT  07/21/2022   IR FLUORO GUIDE CV LINE RIGHT  10/11/2019   IR REMOVAL TUN CV CATH W/O FL  07/17/2022   IR REMOVAL TUN CV CATH W/O FL  08/18/2022   IR US  GUIDE VASC ACCESS LEFT  07/21/2022   IR US  GUIDE VASC ACCESS LEFT  07/21/2022   IR US  GUIDE VASC ACCESS RIGHT  10/11/2019   TEE WITHOUT CARDIOVERSION N/A 07/18/2022   Procedure: TRANSESOPHAGEAL ECHOCARDIOGRAM (TEE);  Surgeon:  Alvan Ronal BRAVO, MD;  Location: [facility] ENDOSCOPY;  Service: Cardiovascular;  Laterality: N/A;   THORACOTOMY Left 11/08/2013   Procedure: LEFT THORACOTOMY;  Surgeon: Maude Fleeta Ochoa, MD;  Location: [facility] OR;  Service: Thoracic;  Laterality: Left;     Social History   Socioeconomic History   Marital status: Single    Spouse name: Not on file   Number of children: Not on file   Years of education: Not on file   Highest education level: Some college, no degree  Occupational History   Not on file  Tobacco Use   Smoking status: Never   Smokeless tobacco: Never  Vaping Use   Vaping status: Never Used  Substance and Sexual Activity   Alcohol use: No    Alcohol/week: 0.0 standard drinks of alcohol   Drug use: Yes    Types: Marijuana    Comment: last used 09/15/2020   Sexual activity: Yes    Partners: Female  Other Topics Concern   Not on file  Social History Narrative   Lives in [place] by himself.  Currently in [facility].   Caffeine- none       Social Drivers of Corporate Investment Banker Strain: Not on file  Food Insecurity: No Food Insecurity (09/04/2023)   Hunger Vital Sign    Worried About Running Out of Food in the Last Year: Never true    Ran Out of Food in the Last Year: Never true  Transportation Needs: No Transportation Needs (09/04/2023)   PRAPARE - Administrator, Civil Service (Medical): No    Lack of Transportation (Non-Medical): No  Physical Activity: Insufficiently Active (11/27/2021)   Exercise Vital Sign    Days of Exercise per Week: 2 days    Minutes of Exercise per Session: 30 min  Stress: Stress Concern Present (11/27/2021)   [employer] of Occupational Health - Occupational Stress Questionnaire    Feeling of Stress : To some extent  Social Connections: Unknown (02/17/2023)   Received from [facility]   Social Network    Social Network: Not on file  Intimate Partner Violence: Not At Risk (09/04/2023)   Humiliation, Afraid,  Rape, and Kick questionnaire    Fear of Current or Ex-Partner: No    Emotionally Abused: No    Physically Abused: No    Sexually Abused: No    Review of Systems: ROS negative except for what is noted on the assessment and plan.  Objective:   Vitals:   10/15/23 1316  BP: (!) 127/57  Pulse: (!) 56  Temp: 98.3 F (36.8 C)  TempSrc: Oral  SpO2: 100%  Weight: 254 lb 1.6 oz (115.3 kg)  Height: 6' 5 (1.956 m)    Physical Exam: Constitutional: well-appearing, in no acute distress Cardiovascular: regular rate and rhythm, no m/r/g Pulmonary/Chest: normal work of breathing on room air, lungs clear to auscultation bilaterally MSK: Right foot-sutures remain in place from amputation in December, no surrounding erythema and wound is well-approximated Left foot-dressing in place with no surrounding edema or erythema above bandages Skin: warm and dry  Assessment & Plan:  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  He underwent amputation of third and fourth ray on right foot December 28.  He followed up with podiatry in January and wound dehiscence was noted.  He has had a home health agent come out twice a week to have dressing changed.  He has missed follow-up with podiatry by about 2 weeks. P: Home health agent to go to home tomorrow to change dressing I sent a message to his podiatrist Dr. Malvin to help coordinate follow-up.  Phone number for [facility] also provided on after visit summary  Shortness of breath He endorses shortness of breath for the last week.  He was diagnosed with influenza 3 days ago and has completed Tamiflu .  He notes shortness of breath most when he lays down at night to sleep and feels like he has some wheezing. His weight is up by about 20 pounds.  He has not missed any dialysis sessions recently.  Oxygen saturation on room air is 100% On exam there are no crackles  or wheezing to his lungs, he does not have lower extremity edema. A: Shortness of breath multifactorial in the setting of known influenza infection and mild volume overload P: He has dialysis tomorrow  Type 2 diabetes mellitus with diabetic nephropathy (HCC) No medications include NovoLog  17 units 3 times daily, glargine 20 units nightly.  He ask for refills of glipizide .  This is not on his discharge medication.  I talked with him about stopping this medication with risk for hypoglycemia in setting of also taking insulin . Lab Results  Component Value Date   HGBA1C 6.8 (H) 09/03/2023  A1c likely to be inaccurate with ESRD Plan: He does not have glucometer with him today but denies having recent hypoglycemia episodes.  He is having trouble with his current glucometer and request another be sent -Glucometer sent to pharmacy -Follow-up in 1 month for repeat A1c and to review glucometer data  Primary hypertension Blood pressure well-controlled at 127/57.  Following hospital discharge amlodipine  and clonidine  were held.  He is currently taking hydralazine  50 mg 3 times daily and carvedilol  25 mg twice daily and reports adherence. P: With blood pressure being well-controlled will move amlodipine  and clonidine  from his med list Continue hydralazine  50 mg 3 times daily and carvedilol  25 mg twice daily   Patient discussed with Dr. Jerrell Pagan Amitai Delaughter, D.O. [facility] Internal [facility]  PGY-3 Pager: [phone]  Phone: [phone] Date 10/15/2023  Time 5:11 PM

## 2023-10-15 NOTE — Assessment & Plan Note (Signed)
 No medications include NovoLog  17 units 3 times daily, glargine 20 units nightly.  He ask for refills of glipizide .  This is not on his discharge medication.  I talked with him about stopping this medication with risk for hypoglycemia in setting of also taking insulin . Lab Results  Component Value Date   HGBA1C 6.8 (H) 09/03/2023  A1c likely to be inaccurate with ESRD Plan: He does not have glucometer with him today but denies having recent hypoglycemia episodes.  He is having trouble with his current glucometer and request another be sent -Glucometer sent to pharmacy -Follow-up in 1 month for repeat A1c and to review glucometer data

## 2023-10-15 NOTE — Assessment & Plan Note (Signed)
 He endorses shortness of breath for the last week.  He was diagnosed with influenza 3 days ago and has completed Tamiflu .  He notes shortness of breath most when he lays down at night to sleep and feels like he has some wheezing. His weight is up by about 20 pounds.  He has not missed any dialysis sessions recently.  Oxygen saturation on room air is 100% On exam there are no crackles or wheezing to his lungs, he does not have lower extremity edema. A: Shortness of breath multifactorial in the setting of known influenza infection and mild volume overload P: He has dialysis tomorrow

## 2023-10-16 ENCOUNTER — Telehealth: Payer: Self-pay | Admitting: Podiatry

## 2023-10-16 DIAGNOSIS — N186 End stage renal disease: Secondary | ICD-10-CM | POA: Diagnosis not present

## 2023-10-16 DIAGNOSIS — D689 Coagulation defect, unspecified: Secondary | ICD-10-CM | POA: Diagnosis not present

## 2023-10-16 DIAGNOSIS — Z992 Dependence on renal dialysis: Secondary | ICD-10-CM | POA: Diagnosis not present

## 2023-10-16 DIAGNOSIS — N2581 Secondary hyperparathyroidism of renal origin: Secondary | ICD-10-CM | POA: Diagnosis not present

## 2023-10-16 DIAGNOSIS — T8249XA Other complication of vascular dialysis catheter, initial encounter: Secondary | ICD-10-CM | POA: Diagnosis not present

## 2023-10-16 NOTE — Telephone Encounter (Signed)
 Pt left message today at 741am stating some one was to have contacted him about an appt for his toe amputation.  Upon checking chart he was to have scheduled an appt when he left his appt on 1/9 for 2 wks.  I called and was not able to leave a message( not able to complete call at this time). I sent my chart message for pt that I have scheduled him for 2/11 at 3pm and to call to confirm appt.

## 2023-10-16 NOTE — Progress Notes (Signed)
 Internal Medicine Clinic Attending  Case discussed with the resident physician at the time of the visit.  We reviewed the patient's history, exam, and pertinent patient test results.  I agree with the assessment, diagnosis, and plan of care documented in the resident's note.

## 2023-10-20 ENCOUNTER — Encounter: Payer: Self-pay | Admitting: Podiatry

## 2023-10-20 ENCOUNTER — Ambulatory Visit (INDEPENDENT_AMBULATORY_CARE_PROVIDER_SITE_OTHER): Payer: Medicaid Other | Admitting: Podiatry

## 2023-10-20 ENCOUNTER — Telehealth: Payer: Self-pay | Admitting: Podiatry

## 2023-10-20 ENCOUNTER — Ambulatory Visit (INDEPENDENT_AMBULATORY_CARE_PROVIDER_SITE_OTHER): Payer: Medicaid Other

## 2023-10-20 ENCOUNTER — Other Ambulatory Visit (HOSPITAL_COMMUNITY): Payer: Self-pay

## 2023-10-20 VITALS — Temp 97.2°F | Ht 76.0 in | Wt 255.0 lb

## 2023-10-20 DIAGNOSIS — L03119 Cellulitis of unspecified part of limb: Secondary | ICD-10-CM

## 2023-10-20 DIAGNOSIS — Z9889 Other specified postprocedural states: Secondary | ICD-10-CM

## 2023-10-20 DIAGNOSIS — L02619 Cutaneous abscess of unspecified foot: Secondary | ICD-10-CM

## 2023-10-20 DIAGNOSIS — M869 Osteomyelitis, unspecified: Secondary | ICD-10-CM

## 2023-10-20 NOTE — Telephone Encounter (Signed)
Pt called and is at the pharmacy and they are saying they did not get any prescriptions sent in for him. It was for antibiotics. The pharmacy is [facility]

## 2023-10-20 NOTE — Progress Notes (Signed)
  Subjective:  Patient ID: Jeremy [name], male    DOB: 12-06-1980,  MRN: [id number]   DOS: 09/05/23 Procedure: 1. 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 Resection of base of 4th proximal phalanx, left foot 3. Application of calcium sulfate dissolvable beads with vancomycin and tobramycin, left foot  43 y.o. male seen for post op check.   Patient is approximately 6 weeks status post above procedures.  He is walking in postop shoe.  Has had home health care nurse for dressing changes.  Review of Systems: Negative except as noted in the HPI. Denies N/V/F/Ch.   Objective:   Vitals:   10/20/23 1536  Temp: (!) 97.2 F (36.2 C)    Body mass index is 31.04 kg/m. Constitutional Well developed. Well nourished.  Vascular Foot warm and well perfused. Capillary refill normal to all digits.   No calf pain with palpation  Neurologic Normal speech. Oriented to person, place, and time. Epicritic sensation absent to toes  Dermatologic  Dehiscence noted at the amputation site with malodor present   Orthopedic: S/p R 3rd partial ray amp.    Radiographs: 10/20/2023 XR 3 views AP lateral bleak of the left foot weightbearing.  Findings:  Pathology: Skin with ulceration and associated necrotizing inflammation.  Bone at the resection margin does not show osteomyelitis.  Fourth toe proximal phalanx no evidence of acute osteomyelitis  Micro: Proteus mirabilis rare bacteriodes Caccae   Assessment:   Abscess and osteomyelitis of left foot  Plan:  Patient was evaluated and treated and all questions answered.  PO 6 weeks s/p left foot partial 3rd ray amputation, 4th proximal phalanx base resection -Concern for dehiscence at the surgical site.  Also concern for possible osteomyelitis at the resected margin of the third metatarsal. -Discussed with patient I recommend for him to go to the emergency department to be admitted to [facility] today.  He states he is not able to go today and  needs to get some stuff done at home due to a lot going on at home currently.  As his vitals are stable and he does not feel sick at all denies nausea vomiting fever chills I discussed we could put him on antibiotics orally and plan for direct admission early next week.  He is agreeable to this. -Reinforced to the patient that if he has any nausea vomiting fever chills he needs to go to the emergency department directly as it could be a sign of bacteremia or sepsis. -Discussed with the patient that he will likely need fourth toe amputation and possible revision resection of the third metatarsal versus transmetatarsal amputation and that he will need surgery to address the infection in his left foot.  He is understanding of this. -Will send E Rx for ciprofloxacin 500 mg twice daily for 10 days. -XR: Possible erosions of the distal aspect of the third metatarsal amputation site.  Concerning for osteomyelitis of the resected margin of the third metatarsal           Corinna Gab, DPM [facility]

## 2023-10-21 ENCOUNTER — Other Ambulatory Visit: Payer: Self-pay | Admitting: Podiatry

## 2023-10-21 ENCOUNTER — Other Ambulatory Visit (HOSPITAL_COMMUNITY): Payer: Self-pay

## 2023-10-21 MED ORDER — CIPROFLOXACIN HCL 500 MG PO TABS
500.0000 mg | ORAL_TABLET | Freq: Two times a day (BID) | ORAL | 0 refills | Status: DC
  Filled 2023-10-21: qty 20, 10d supply, fill #0

## 2023-10-21 NOTE — Progress Notes (Signed)
Ciprofloxacin sent.

## 2023-10-22 DIAGNOSIS — I5042 Chronic combined systolic (congestive) and diastolic (congestive) heart failure: Secondary | ICD-10-CM | POA: Diagnosis not present

## 2023-10-22 DIAGNOSIS — D689 Coagulation defect, unspecified: Secondary | ICD-10-CM | POA: Diagnosis not present

## 2023-10-22 DIAGNOSIS — E11621 Type 2 diabetes mellitus with foot ulcer: Secondary | ICD-10-CM | POA: Diagnosis not present

## 2023-10-22 DIAGNOSIS — Z4781 Encounter for orthopedic aftercare following surgical amputation: Secondary | ICD-10-CM | POA: Diagnosis not present

## 2023-10-22 DIAGNOSIS — M869 Osteomyelitis, unspecified: Secondary | ICD-10-CM | POA: Diagnosis not present

## 2023-10-22 DIAGNOSIS — B964 Proteus (mirabilis) (morganii) as the cause of diseases classified elsewhere: Secondary | ICD-10-CM | POA: Diagnosis not present

## 2023-10-22 DIAGNOSIS — F4321 Adjustment disorder with depressed mood: Secondary | ICD-10-CM | POA: Diagnosis not present

## 2023-10-22 DIAGNOSIS — D509 Iron deficiency anemia, unspecified: Secondary | ICD-10-CM | POA: Diagnosis not present

## 2023-10-22 DIAGNOSIS — K219 Gastro-esophageal reflux disease without esophagitis: Secondary | ICD-10-CM | POA: Diagnosis not present

## 2023-10-22 DIAGNOSIS — I428 Other cardiomyopathies: Secondary | ICD-10-CM | POA: Diagnosis not present

## 2023-10-22 DIAGNOSIS — I351 Nonrheumatic aortic (valve) insufficiency: Secondary | ICD-10-CM | POA: Diagnosis not present

## 2023-10-22 DIAGNOSIS — N2581 Secondary hyperparathyroidism of renal origin: Secondary | ICD-10-CM | POA: Diagnosis not present

## 2023-10-22 DIAGNOSIS — D631 Anemia in chronic kidney disease: Secondary | ICD-10-CM | POA: Diagnosis not present

## 2023-10-22 DIAGNOSIS — K045 Chronic apical periodontitis: Secondary | ICD-10-CM | POA: Diagnosis not present

## 2023-10-22 DIAGNOSIS — L97521 Non-pressure chronic ulcer of other part of left foot limited to breakdown of skin: Secondary | ICD-10-CM | POA: Diagnosis not present

## 2023-10-22 DIAGNOSIS — E1122 Type 2 diabetes mellitus with diabetic chronic kidney disease: Secondary | ICD-10-CM | POA: Diagnosis not present

## 2023-10-22 DIAGNOSIS — I2510 Atherosclerotic heart disease of native coronary artery without angina pectoris: Secondary | ICD-10-CM | POA: Diagnosis not present

## 2023-10-22 DIAGNOSIS — E789 Disorder of lipoprotein metabolism, unspecified: Secondary | ICD-10-CM | POA: Diagnosis not present

## 2023-10-22 DIAGNOSIS — I110 Hypertensive heart disease with heart failure: Secondary | ICD-10-CM | POA: Diagnosis not present

## 2023-10-22 DIAGNOSIS — J45909 Unspecified asthma, uncomplicated: Secondary | ICD-10-CM | POA: Diagnosis not present

## 2023-10-22 DIAGNOSIS — E1169 Type 2 diabetes mellitus with other specified complication: Secondary | ICD-10-CM | POA: Diagnosis not present

## 2023-10-22 DIAGNOSIS — Z992 Dependence on renal dialysis: Secondary | ICD-10-CM | POA: Diagnosis not present

## 2023-10-22 DIAGNOSIS — N186 End stage renal disease: Secondary | ICD-10-CM | POA: Diagnosis not present

## 2023-10-26 ENCOUNTER — Inpatient Hospital Stay (HOSPITAL_COMMUNITY): Payer: 59

## 2023-10-26 ENCOUNTER — Inpatient Hospital Stay (HOSPITAL_COMMUNITY)
Admission: EM | Admit: 2023-10-26 | Discharge: 2023-10-30 | DRG: 987 | Disposition: A | Discharge status: Home / Self Care | Payer: 59 | Attending: Internal Medicine | Admitting: Internal Medicine

## 2023-10-26 ENCOUNTER — Encounter (HOSPITAL_COMMUNITY): Payer: Self-pay | Admitting: Emergency Medicine

## 2023-10-26 ENCOUNTER — Other Ambulatory Visit: Payer: Self-pay

## 2023-10-26 ENCOUNTER — Emergency Department (HOSPITAL_COMMUNITY): Payer: 59

## 2023-10-26 DIAGNOSIS — I130 Hypertensive heart and chronic kidney disease with heart failure and stage 1 through stage 4 chronic kidney disease, or unspecified chronic kidney disease: Secondary | ICD-10-CM | POA: Diagnosis not present

## 2023-10-26 DIAGNOSIS — E11621 Type 2 diabetes mellitus with foot ulcer: Secondary | ICD-10-CM | POA: Diagnosis present

## 2023-10-26 DIAGNOSIS — E872 Acidosis, unspecified: Secondary | ICD-10-CM | POA: Diagnosis present

## 2023-10-26 DIAGNOSIS — L97524 Non-pressure chronic ulcer of other part of left foot with necrosis of bone: Secondary | ICD-10-CM

## 2023-10-26 DIAGNOSIS — I160 Hypertensive urgency: Secondary | ICD-10-CM

## 2023-10-26 DIAGNOSIS — R0989 Other specified symptoms and signs involving the circulatory and respiratory systems: Secondary | ICD-10-CM | POA: Diagnosis not present

## 2023-10-26 DIAGNOSIS — I351 Nonrheumatic aortic (valve) insufficiency: Secondary | ICD-10-CM | POA: Diagnosis not present

## 2023-10-26 DIAGNOSIS — I5083 High output heart failure: Secondary | ICD-10-CM

## 2023-10-26 DIAGNOSIS — Z794 Long term (current) use of insulin: Secondary | ICD-10-CM

## 2023-10-26 DIAGNOSIS — I330 Acute and subacute infective endocarditis: Secondary | ICD-10-CM | POA: Diagnosis not present

## 2023-10-26 DIAGNOSIS — E1169 Type 2 diabetes mellitus with other specified complication: Secondary | ICD-10-CM | POA: Diagnosis not present

## 2023-10-26 DIAGNOSIS — I21A1 Myocardial infarction type 2: Secondary | ICD-10-CM | POA: Diagnosis present

## 2023-10-26 DIAGNOSIS — Z992 Dependence on renal dialysis: Secondary | ICD-10-CM

## 2023-10-26 DIAGNOSIS — E11628 Type 2 diabetes mellitus with other skin complications: Secondary | ICD-10-CM | POA: Diagnosis not present

## 2023-10-26 DIAGNOSIS — N186 End stage renal disease: Secondary | ICD-10-CM

## 2023-10-26 DIAGNOSIS — I132 Hypertensive heart and chronic kidney disease with heart failure and with stage 5 chronic kidney disease, or end stage renal disease: Principal | ICD-10-CM | POA: Diagnosis present

## 2023-10-26 DIAGNOSIS — I82210 Acute embolism and thrombosis of superior vena cava: Secondary | ICD-10-CM | POA: Diagnosis present

## 2023-10-26 DIAGNOSIS — I5023 Acute on chronic systolic (congestive) heart failure: Secondary | ICD-10-CM | POA: Diagnosis present

## 2023-10-26 DIAGNOSIS — J810 Acute pulmonary edema: Secondary | ICD-10-CM | POA: Diagnosis present

## 2023-10-26 DIAGNOSIS — L97909 Non-pressure chronic ulcer of unspecified part of unspecified lower leg with unspecified severity: Secondary | ICD-10-CM | POA: Diagnosis not present

## 2023-10-26 DIAGNOSIS — J9601 Acute respiratory failure with hypoxia: Secondary | ICD-10-CM | POA: Diagnosis not present

## 2023-10-26 DIAGNOSIS — E785 Hyperlipidemia, unspecified: Secondary | ICD-10-CM | POA: Diagnosis present

## 2023-10-26 DIAGNOSIS — R0602 Shortness of breath: Secondary | ICD-10-CM | POA: Diagnosis not present

## 2023-10-26 DIAGNOSIS — L089 Local infection of the skin and subcutaneous tissue, unspecified: Secondary | ICD-10-CM | POA: Diagnosis present

## 2023-10-26 DIAGNOSIS — R0609 Other forms of dyspnea: Secondary | ICD-10-CM | POA: Diagnosis not present

## 2023-10-26 DIAGNOSIS — M7989 Other specified soft tissue disorders: Secondary | ICD-10-CM | POA: Diagnosis not present

## 2023-10-26 DIAGNOSIS — M86172 Other acute osteomyelitis, left ankle and foot: Secondary | ICD-10-CM

## 2023-10-26 DIAGNOSIS — E1140 Type 2 diabetes mellitus with diabetic neuropathy, unspecified: Secondary | ICD-10-CM | POA: Diagnosis present

## 2023-10-26 DIAGNOSIS — Z8249 Family history of ischemic heart disease and other diseases of the circulatory system: Secondary | ICD-10-CM

## 2023-10-26 DIAGNOSIS — T8131XA Disruption of external operation (surgical) wound, not elsewhere classified, initial encounter: Secondary | ICD-10-CM | POA: Insufficient documentation

## 2023-10-26 DIAGNOSIS — I428 Other cardiomyopathies: Secondary | ICD-10-CM | POA: Diagnosis present

## 2023-10-26 DIAGNOSIS — I509 Heart failure, unspecified: Secondary | ICD-10-CM | POA: Diagnosis not present

## 2023-10-26 DIAGNOSIS — Z1152 Encounter for screening for COVID-19: Secondary | ICD-10-CM | POA: Diagnosis not present

## 2023-10-26 DIAGNOSIS — R7881 Bacteremia: Secondary | ICD-10-CM | POA: Diagnosis not present

## 2023-10-26 DIAGNOSIS — N2581 Secondary hyperparathyroidism of renal origin: Secondary | ICD-10-CM | POA: Diagnosis present

## 2023-10-26 DIAGNOSIS — N189 Chronic kidney disease, unspecified: Secondary | ICD-10-CM | POA: Diagnosis not present

## 2023-10-26 DIAGNOSIS — I517 Cardiomegaly: Secondary | ICD-10-CM | POA: Diagnosis not present

## 2023-10-26 DIAGNOSIS — J351 Hypertrophy of tonsils: Secondary | ICD-10-CM | POA: Diagnosis not present

## 2023-10-26 DIAGNOSIS — Z79899 Other long term (current) drug therapy: Secondary | ICD-10-CM

## 2023-10-26 DIAGNOSIS — M869 Osteomyelitis, unspecified: Secondary | ICD-10-CM | POA: Diagnosis present

## 2023-10-26 DIAGNOSIS — E1122 Type 2 diabetes mellitus with diabetic chronic kidney disease: Secondary | ICD-10-CM

## 2023-10-26 DIAGNOSIS — I110 Hypertensive heart disease with heart failure: Secondary | ICD-10-CM | POA: Diagnosis not present

## 2023-10-26 DIAGNOSIS — Z833 Family history of diabetes mellitus: Secondary | ICD-10-CM

## 2023-10-26 DIAGNOSIS — I5042 Chronic combined systolic (congestive) and diastolic (congestive) heart failure: Secondary | ICD-10-CM | POA: Diagnosis not present

## 2023-10-26 DIAGNOSIS — E8779 Other fluid overload: Principal | ICD-10-CM

## 2023-10-26 DIAGNOSIS — I5043 Acute on chronic combined systolic (congestive) and diastolic (congestive) heart failure: Secondary | ICD-10-CM | POA: Diagnosis not present

## 2023-10-26 DIAGNOSIS — K056 Periodontal disease, unspecified: Secondary | ICD-10-CM | POA: Diagnosis present

## 2023-10-26 DIAGNOSIS — K011 Impacted teeth: Secondary | ICD-10-CM | POA: Diagnosis not present

## 2023-10-26 DIAGNOSIS — M25475 Effusion, left foot: Secondary | ICD-10-CM | POA: Diagnosis not present

## 2023-10-26 DIAGNOSIS — K029 Dental caries, unspecified: Secondary | ICD-10-CM | POA: Diagnosis not present

## 2023-10-26 DIAGNOSIS — D631 Anemia in chronic kidney disease: Secondary | ICD-10-CM | POA: Diagnosis present

## 2023-10-26 DIAGNOSIS — K219 Gastro-esophageal reflux disease without esophagitis: Secondary | ICD-10-CM | POA: Diagnosis present

## 2023-10-26 DIAGNOSIS — Z9889 Other specified postprocedural states: Secondary | ICD-10-CM | POA: Diagnosis not present

## 2023-10-26 DIAGNOSIS — I5020 Unspecified systolic (congestive) heart failure: Secondary | ICD-10-CM | POA: Diagnosis present

## 2023-10-26 DIAGNOSIS — Z634 Disappearance and death of family member: Secondary | ICD-10-CM

## 2023-10-26 LAB — CBC WITH DIFFERENTIAL/PLATELET
Abs Immature Granulocytes: 0.05 10*3/uL (ref 0.00–0.07)
Basophils Absolute: 0.1 10*3/uL (ref 0.0–0.1)
Basophils Relative: 1 %
Eosinophils Absolute: 0.1 10*3/uL (ref 0.0–0.5)
Eosinophils Relative: 1 %
HCT: 44.8 % (ref 39.0–52.0)
Hemoglobin: 14 g/dL (ref 13.0–17.0)
Immature Granulocytes: 0 %
Lymphocytes Relative: 6 %
Lymphs Abs: 1 10*3/uL (ref 0.7–4.0)
MCH: 27.5 pg (ref 26.0–34.0)
MCHC: 31.3 g/dL (ref 30.0–36.0)
MCV: 87.8 fL (ref 80.0–100.0)
Monocytes Absolute: 0.6 10*3/uL (ref 0.1–1.0)
Monocytes Relative: 4 %
Neutro Abs: 13.3 10*3/uL — ABNORMAL HIGH (ref 1.7–7.7)
Neutrophils Relative %: 88 %
Platelets: 372 10*3/uL (ref 150–400)
RBC: 5.1 MIL/uL (ref 4.22–5.81)
RDW: 21.8 % — ABNORMAL HIGH (ref 11.5–15.5)
WBC: 15.1 10*3/uL — ABNORMAL HIGH (ref 4.0–10.5)
nRBC: 0 % (ref 0.0–0.2)

## 2023-10-26 LAB — COMPREHENSIVE METABOLIC PANEL
ALT: 15 U/L (ref 0–44)
AST: 18 U/L (ref 15–41)
Albumin: 3.1 g/dL — ABNORMAL LOW (ref 3.5–5.0)
Alkaline Phosphatase: 45 U/L (ref 38–126)
Anion gap: 18 — ABNORMAL HIGH (ref 5–15)
BUN: 94 mg/dL — ABNORMAL HIGH (ref 6–20)
CO2: 21 mmol/L — ABNORMAL LOW (ref 22–32)
Calcium: 9.3 mg/dL (ref 8.9–10.3)
Chloride: 101 mmol/L (ref 98–111)
Creatinine, Ser: 13.28 mg/dL — ABNORMAL HIGH (ref 0.61–1.24)
GFR, Estimated: 4 mL/min — ABNORMAL LOW (ref >60)
Glucose, Bld: 184 mg/dL — ABNORMAL HIGH (ref 70–99)
Potassium: 4.7 mmol/L (ref 3.5–5.1)
Sodium: 140 mmol/L (ref 135–145)
Total Bilirubin: 0.7 mg/dL (ref 0.0–1.2)
Total Protein: 7.8 g/dL (ref 6.5–8.1)

## 2023-10-26 LAB — LACTIC ACID, PLASMA
Lactic Acid, Venous: 2.1 mmol/L (ref 0.5–1.9)
Lactic Acid, Venous: 1.8 mmol/L (ref 0.5–1.9)

## 2023-10-26 LAB — ECHOCARDIOGRAM COMPLETE
AV Mean grad: 5 mm[Hg]
AV Peak grad: 8.5 mm[Hg]
Ao pk vel: 1.46 m/s
Area-P 1/2: 5.42 cm2
Height: 77 in
P 1/2 time: 176 ms
S' Lateral: 6 cm
Weight: 3840 [oz_av]

## 2023-10-26 LAB — I-STAT CHEM 8, ED
BUN: 84 mg/dL — ABNORMAL HIGH (ref 6–20)
Calcium, Ion: 1.06 mmol/L — ABNORMAL LOW (ref 1.15–1.40)
Chloride: 106 mmol/L (ref 98–111)
Creatinine, Ser: 13.6 mg/dL — ABNORMAL HIGH (ref 0.61–1.24)
Glucose, Bld: 180 mg/dL — ABNORMAL HIGH (ref 70–99)
HCT: 44 % (ref 39.0–52.0)
Hemoglobin: 15 g/dL (ref 13.0–17.0)
Potassium: 4.6 mmol/L (ref 3.5–5.1)
Sodium: 140 mmol/L (ref 135–145)
TCO2: 24 mmol/L (ref 22–32)

## 2023-10-26 LAB — CBC
HCT: 42 % (ref 39.0–52.0)
Hemoglobin: 13.3 g/dL (ref 13.0–17.0)
MCH: 27.8 pg (ref 26.0–34.0)
MCHC: 31.7 g/dL (ref 30.0–36.0)
MCV: 87.9 fL (ref 80.0–100.0)
Platelets: 381 10*3/uL (ref 150–400)
RBC: 4.78 MIL/uL (ref 4.22–5.81)
RDW: 21.3 % — ABNORMAL HIGH (ref 11.5–15.5)
WBC: 12.8 10*3/uL — ABNORMAL HIGH (ref 4.0–10.5)
nRBC: 0 % (ref 0.0–0.2)

## 2023-10-26 LAB — BETA-HYDROXYBUTYRIC ACID: Beta-Hydroxybutyric Acid: 0.25 mmol/L (ref 0.05–0.27)

## 2023-10-26 LAB — TROPONIN I (HIGH SENSITIVITY)
Troponin I (High Sensitivity): 54 ng/L — ABNORMAL HIGH (ref <18)
Troponin I (High Sensitivity): 95 ng/L — ABNORMAL HIGH (ref <18)
Troponin I (High Sensitivity): 112 ng/L

## 2023-10-26 LAB — CREATININE, SERUM
Creatinine, Ser: 13.45 mg/dL — ABNORMAL HIGH (ref 0.61–1.24)
GFR, Estimated: 4 mL/min — ABNORMAL LOW (ref >60)

## 2023-10-26 LAB — RESP PANEL BY RT-PCR (RSV, FLU A&B, COVID)  RVPGX2
Influenza A by PCR: NEGATIVE
Influenza B by PCR: NEGATIVE
Resp Syncytial Virus by PCR: NEGATIVE
SARS Coronavirus 2 by RT PCR: NEGATIVE

## 2023-10-26 LAB — CBG MONITORING, ED
Glucose-Capillary: 124 mg/dL — ABNORMAL HIGH (ref 70–99)
Glucose-Capillary: 211 mg/dL — ABNORMAL HIGH (ref 70–99)
Glucose-Capillary: 171 mg/dL — ABNORMAL HIGH (ref 70–99)

## 2023-10-26 LAB — HEPATITIS B SURFACE ANTIGEN: Hepatitis B Surface Ag: NONREACTIVE

## 2023-10-26 LAB — VAS US ABI WITH/WO TBI
Left ABI: 1.5
Right ABI: 1.4

## 2023-10-26 LAB — BRAIN NATRIURETIC PEPTIDE: B Natriuretic Peptide: 859.3 pg/mL — ABNORMAL HIGH (ref 0.0–100.0)

## 2023-10-26 MED ORDER — INSULIN GLARGINE-YFGN 100 UNIT/ML ~~LOC~~ SOLN
20.0000 [IU] | Freq: Every day | SUBCUTANEOUS | Status: DC
  Administered 2023-10-26 – 2023-10-28 (×3): 20 [IU] via SUBCUTANEOUS
  Filled 2023-10-26 (×5): qty 0.2

## 2023-10-26 MED ORDER — PROCHLORPERAZINE EDISYLATE 10 MG/2ML IJ SOLN
10.0000 mg | Freq: Once | INTRAMUSCULAR | Status: AC
  Administered 2023-10-26: 10 mg via INTRAVENOUS
  Filled 2023-10-26: qty 2

## 2023-10-26 MED ORDER — LORAZEPAM BOLUS VIA INFUSION: 0.5000 mg | Freq: Once | INTRAVENOUS | Status: DC

## 2023-10-26 MED ORDER — HEPARIN SODIUM (PORCINE) 1000 UNIT/ML IJ SOLN
4600.0000 [IU] | Freq: Once | INTRAMUSCULAR | Status: AC
  Administered 2023-10-26: 4600 [IU] via INTRAVENOUS
  Filled 2023-10-26: qty 5

## 2023-10-26 MED ORDER — ATORVASTATIN CALCIUM 80 MG PO TABS
80.0000 mg | ORAL_TABLET | Freq: Every day | ORAL | Status: DC
  Administered 2023-10-26 – 2023-10-29 (×4): 80 mg via ORAL
  Filled 2023-10-26 (×4): qty 1
  Filled 2023-10-26: qty 2

## 2023-10-26 MED ORDER — ANTICOAGULANT SODIUM CITRATE 4% (200MG/5ML) IV SOLN: 5.0000 mL | Status: DC | PRN

## 2023-10-26 MED ORDER — CARVEDILOL 25 MG PO TABS
50.0000 mg | ORAL_TABLET | Freq: Two times a day (BID) | ORAL | Status: DC
  Administered 2023-10-26 – 2023-10-30 (×6): 50 mg via ORAL
  Filled 2023-10-26 (×2): qty 4
  Filled 2023-10-26 (×4): qty 2

## 2023-10-26 MED ORDER — HYDROMORPHONE HCL 2 MG PO TABS: 1.0000 mg | ORAL_TABLET | Freq: Four times a day (QID) | ORAL | Status: DC | PRN

## 2023-10-26 MED ORDER — INSULIN GLARGINE-YFGN 100 UNIT/ML ~~LOC~~ SOPN
20.0000 [IU] | PEN_INJECTOR | Freq: Every day | SUBCUTANEOUS | Status: DC
  Filled 2023-10-26: qty 3

## 2023-10-26 MED ORDER — LIDOCAINE HCL (PF) 1 % IJ SOLN: 5.0000 mL | INTRAMUSCULAR | Status: DC | PRN

## 2023-10-26 MED ORDER — SEVELAMER CARBONATE 800 MG PO TABS
2400.0000 mg | ORAL_TABLET | Freq: Three times a day (TID) | ORAL | Status: DC
  Administered 2023-10-26 – 2023-10-29 (×7): 2400 mg via ORAL
  Filled 2023-10-26 (×9): qty 3

## 2023-10-26 MED ORDER — HYDRALAZINE HCL 50 MG PO TABS
50.0000 mg | ORAL_TABLET | Freq: Three times a day (TID) | ORAL | Status: DC
  Administered 2023-10-26 – 2023-10-29 (×8): 50 mg via ORAL
  Filled 2023-10-26 (×8): qty 1

## 2023-10-26 MED ORDER — CIPROFLOXACIN HCL 500 MG PO TABS: 500.0000 mg | ORAL_TABLET | Freq: Two times a day (BID) | ORAL | Status: DC

## 2023-10-26 MED ORDER — ONDANSETRON HCL 4 MG PO TABS
4.0000 mg | ORAL_TABLET | Freq: Every day | ORAL | Status: DC | PRN
  Administered 2023-10-26: 4 mg via ORAL
  Filled 2023-10-26 (×2): qty 1

## 2023-10-26 MED ORDER — PANTOPRAZOLE SODIUM 40 MG PO TBEC
40.0000 mg | DELAYED_RELEASE_TABLET | Freq: Every day | ORAL | Status: DC
  Administered 2023-10-26 – 2023-10-29 (×4): 40 mg via ORAL
  Filled 2023-10-26 (×4): qty 1

## 2023-10-26 MED ORDER — LORAZEPAM 2 MG/ML IJ SOLN
0.5000 mg | Freq: Once | INTRAMUSCULAR | Status: AC
  Administered 2023-10-26: 0.5 mg via INTRAVENOUS
  Filled 2023-10-26: qty 1

## 2023-10-26 MED ORDER — INSULIN ASPART 100 UNIT/ML IJ SOLN
0.0000 [IU] | Freq: Three times a day (TID) | INTRAMUSCULAR | Status: DC
  Administered 2023-10-26: 5 [IU] via SUBCUTANEOUS
  Administered 2023-10-27: 2 [IU] via SUBCUTANEOUS
  Administered 2023-10-27 – 2023-10-28 (×4): 3 [IU] via SUBCUTANEOUS

## 2023-10-26 MED ORDER — PENTAFLUOROPROP-TETRAFLUOROETH EX AERO: 1.0000 | INHALATION_SPRAY | CUTANEOUS | Status: DC | PRN

## 2023-10-26 MED ORDER — HEPARIN SODIUM (PORCINE) 5000 UNIT/ML IJ SOLN
5000.0000 [IU] | Freq: Three times a day (TID) | INTRAMUSCULAR | Status: DC
  Administered 2023-10-26 – 2023-10-28 (×6): 5000 [IU] via SUBCUTANEOUS
  Filled 2023-10-26 (×6): qty 1

## 2023-10-26 MED ORDER — HEPARIN SODIUM (PORCINE) 1000 UNIT/ML DIALYSIS
3000.0000 [IU] | INTRAMUSCULAR | Status: AC | PRN
  Administered 2023-10-26: 3000 [IU] via INTRAVENOUS_CENTRAL
  Filled 2023-10-26: qty 3

## 2023-10-26 MED ORDER — ACETAMINOPHEN 500 MG PO TABS
1000.0000 mg | ORAL_TABLET | Freq: Four times a day (QID) | ORAL | Status: DC | PRN
  Administered 2023-10-26 – 2023-10-29 (×4): 1000 mg via ORAL
  Filled 2023-10-26 (×4): qty 2

## 2023-10-26 MED ORDER — LORAZEPAM 2 MG/ML IJ SOLN: 0.5000 mg | Freq: Once | INTRAMUSCULAR | Status: DC

## 2023-10-26 MED ORDER — ALTEPLASE 2 MG IJ SOLR: 2.0000 mg | Freq: Once | INTRAMUSCULAR | Status: DC | PRN

## 2023-10-26 MED ORDER — SODIUM CHLORIDE 0.9 % IV SOLN
1.0000 g | Freq: Every day | INTRAVENOUS | Status: DC
  Administered 2023-10-26: 1 g via INTRAVENOUS
  Filled 2023-10-26 (×2): qty 10

## 2023-10-26 MED ORDER — LIDOCAINE 5 % EX PTCH
1.0000 | MEDICATED_PATCH | CUTANEOUS | Status: DC
  Administered 2023-10-26 – 2023-10-29 (×3): 1 via TRANSDERMAL
  Filled 2023-10-26 (×4): qty 1

## 2023-10-26 MED ORDER — CIPROFLOXACIN HCL 500 MG PO TABS: 500.0000 mg | ORAL_TABLET | Freq: Every evening | ORAL | Status: DC

## 2023-10-26 MED ORDER — VANCOMYCIN HCL 2000 MG/400ML IV SOLN
2000.0000 mg | Freq: Once | INTRAVENOUS | Status: AC
  Administered 2023-10-26: 2000 mg via INTRAVENOUS
  Filled 2023-10-26: qty 400

## 2023-10-26 MED ORDER — DIPHENHYDRAMINE HCL 50 MG/ML IJ SOLN
25.0000 mg | Freq: Once | INTRAMUSCULAR | Status: AC
  Administered 2023-10-26: 25 mg via INTRAVENOUS
  Filled 2023-10-26: qty 1

## 2023-10-26 MED ORDER — LIDOCAINE-PRILOCAINE 2.5-2.5 % EX CREA: 1.0000 | TOPICAL_CREAM | CUTANEOUS | Status: DC | PRN

## 2023-10-26 MED ORDER — NEPRO/CARBSTEADY PO LIQD: 237.0000 mL | ORAL | Status: DC | PRN

## 2023-10-26 MED ORDER — HEPARIN SODIUM (PORCINE) 1000 UNIT/ML DIALYSIS: 1000.0000 [IU] | INTRAMUSCULAR | Status: DC | PRN

## 2023-10-26 MED ORDER — CHLORHEXIDINE GLUCONATE CLOTH 2 % EX PADS
6.0000 | MEDICATED_PAD | Freq: Every day | CUTANEOUS | Status: DC
  Administered 2023-10-28 – 2023-10-30 (×3): 6 via TOPICAL

## 2023-10-26 MED ORDER — ONDANSETRON HCL 4 MG/2ML IJ SOLN
4.0000 mg | Freq: Once | INTRAMUSCULAR | Status: AC
  Administered 2023-10-26: 4 mg via INTRAVENOUS
  Filled 2023-10-26: qty 2

## 2023-10-26 MED ORDER — VANCOMYCIN HCL IN DEXTROSE 1-5 GM/200ML-% IV SOLN: 1000.0000 mg | INTRAVENOUS | Status: DC

## 2023-10-26 MED ORDER — FUROSEMIDE 10 MG/ML IJ SOLN
40.0000 mg | Freq: Once | INTRAMUSCULAR | Status: AC
  Administered 2023-10-26: 40 mg via INTRAVENOUS
  Filled 2023-10-26 (×2): qty 4

## 2023-10-26 MED ORDER — HEPARIN SODIUM (PORCINE) 1000 UNIT/ML DIALYSIS
4000.0000 [IU] | Freq: Once | INTRAMUSCULAR | Status: AC
  Administered 2023-10-26: 4000 [IU] via INTRAVENOUS_CENTRAL
  Filled 2023-10-26: qty 4

## 2023-10-26 NOTE — Progress Notes (Signed)
 Subjective:  Overnight Events: No acute events overnight  Interval: Patient says that he only has chest pain when coughing and does not have chest pain when he is not coughing. He states that he has been going to his entire sessions of dialysis and that he has not been on lasix for a year or two. The patient's left foot does not have pain and per patient, has not changed recently. He says that it has had some drainage that is not bloody. His foot was last wrapped last night.  Objective:  Vital signs in last 24 hours: Vitals:   10/26/23 1130 10/26/23 1200 10/26/23 1217 10/26/23 1231  BP: (!) 152/81 (!) 143/58 (!) 157/57 (!) 154/69  Pulse: (!) 113 (!) 120 (!) 117 (!) 117  Resp: 17 (!) 31 19 (!) 27  Temp:   99.6 F (37.6 C)   TempSrc:   Oral   SpO2: 98% 100% 100% 100%  Weight:      Height:       Weight change:   Intake/Output Summary (Last 24 hours) at 10/26/2023 1256 Last data filed at 10/26/2023 1217 Gross per 24 hour  Intake --  Output 3500 ml  Net -3500 ml   Physical Exam Constitutional:      General: He is not in acute distress. HENT:     Head: Normocephalic.     Nose:     Comments: Normal to external appearance Eyes:     Comments: Gross movements intact  Cardiovascular:     Rate and Rhythm: Normal rate and regular rhythm.  Pulmonary:     Effort: Pulmonary effort is normal. No respiratory distress.     Breath sounds: Rales present.     Comments: Appears dyspneic Musculoskeletal:     Cervical back: Normal range of motion.     Right lower leg: No edema.     Left lower leg: No edema.  Skin:    Coloration: Skin is not jaundiced or pale.     Comments: Left foot is wrapped in ACE bandage. Left great toe appears normal, but with thickened toenail. Left fourth toe amputation not visualized due to bandage.  Neurological:     General: No focal deficit present.     Mental Status: He is alert and oriented to person, place, and time.  Psychiatric:        Mood and  Affect: Mood normal.        Behavior: Behavior normal.     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 He is not having any chest pain except when he coughs. For his left foot, he is not having any pain, but has noticed some non-bloody drainage from the left foot.  Acute Hypoxemic Respiratory Failure Flash Pulmonary Edema HFpEF Exacerbation Patient presents for acute shortness of breath, says that he stopped lasix "last year" (sister thinks it was stopped more recently) and is not able to say why. His chest  pain is only when coughing. Patient recently had the flu. BNP 859. Chest x-ray showing concerns for worsening pulmonary edema consistent with CHF exacerbation. He reports he is 20 pounds above his dry weight. Difficult to ascertain dry weight, but somewhere between 235-255. Received dialysis today as it was due and concern for volume overload. Most recent Echo 07/2022, so will repeat. Lactate 2.1. -Dialyzed today w/ nephrology -Start lasix -TTEcho pending -As needed BiPAP if patient somnolence improves -SpO2 goal greater than 92%, can give supplemental oxygen to reach goal  Hypertension BP up to 200/90 this morning, now 136/61 after dialysis. -Coreg 50mg  BID, hydralazine 50mg  TID.  Osteomyelitis of third metatarsal of left foot Wound dehiscence of previous surgical site of fourth phalanx of left toe WBC 12.8 down from 15.1 earlier this morning.  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 He is followed by podiatry outpatient; consulted for this hospitalization. He was seen on 10/20/2023 and was recommended to go to the emergency department as patient has developed third metatarsal osteomyelitis of that left foot. Will hold off on antibiotics. Podiatry advises empiric antibiotics. -IV Vanc/Cefepime, pharmacy consult -ESR and CRP pending -Blood culture pending -MRI of the left foot pending   ESRD on dialysis MWF Patient has not missed any dialysis. Cr 13.2, which is around baseline. BUN 94. -Dialysis per nephrology -Resume home Renvela -Renal diet, renal dose meds  Type 2 diabetes mellitus Patient has a past medical history of type 2 diabetes mellitus. His most recent A1c was 6.8 about 2 months ago. This is well-controlled at this time. BHA negative, glucose 187. -Start sliding scale insulin -Resume home Lantus 20 units daily   Anion gap metabolic acidosis Patient does have anion gap of 18. This is likely from uremia (BUN 94). Dialyzed today. Given he does have an infection, would like to check lactic acid. Will also check if patient has ketones concerning for early DKA. Lactate 2.1, BHA .25, CMP shows glucose of 184. -Follow-up VBG  GERD Patient has a past medical history of GERD.  He does report having some heartburn concerns.  He is on Protonix 20 mg daily. Will increase to 40 mg daily. -Increase Protonix to 40 mg daily  Diet: Carb-Modified VTE: Heparin IVF: None,None Code: Full   Prior to Admission Living Arrangement: Home, living with girlfriend Anticipated Discharge Location: Home Barriers to Discharge: Clinical improvement and definitive plan for osteomyelitis   Dispo: Admit patient to Inpatient with expected length of stay greater than 2 midnights.   LOS: 0 days   Kayleen Memos, Medical Student 10/26/2023, 12:56 PM  Attestation for Student Documentation:  I personally was present  and performed or re-performed the history, physical exam and medical decision-making activities of this service and have verified that the service and findings are accurately documented in the student's note.  Morrie Sheldon, MD 10/26/2023, 2:18 PM

## 2023-10-26 NOTE — ED Notes (Signed)
 Paged MD to notify of critical troponin result. RN awaiting return phone call.

## 2023-10-26 NOTE — ED Notes (Signed)
 Pt removed supplemental O2 and states he is not sure when he took it off. O2 saturation maintained 95% and higher on room air.

## 2023-10-26 NOTE — Procedures (Signed)
 I was present at the procedure, reviewed the HD regimen and made appropriate changes.   Vinson Moselle MD  [facility] 10/26/2023, 9:21 AM

## 2023-10-26 NOTE — ED Provider Notes (Signed)
 [place] EMERGENCY DEPARTMENT AT [facility] Provider Note  CSN: [id number] Arrival date & time: 10/26/23 4132  Chief Complaint(s) Shortness of Breath  HPI Jeremy [name] is a 43 y.o. male with PMH ESRD on hemodialysis Monday Wednesday Friday, CHF, T2DM, who presents Emergency Department for evaluation of shortness of breath.  Patient states that he has been feeling progressively more short of breath over the last 2 weeks but today felt significantly worse in the middle of the night.  Found to be hypoxic in the field 72% on room air.  Endorses compliance with dialysis and has not missed any sessions.  EMS did attempt to place the patient on BiPAP but he did not tolerate this.  Patient arrives on a nonrebreather saturating 95%.  Endorses nausea and vomiting.  Denies chest pain, headache, fever or other systemic symptoms.   Past Medical History Past Medical History:  Diagnosis Date   Abscess of left groin    Acute blood loss anemia 11/11/2013   Anemia in chronic kidney disease (CKD)    Asthma    Boil of scrotum 11/21/2015   Chest pain    a. 01/2015 Lexiscan MV: EF 28%, inferior, inferolateral, apical ischemia;  b. 01/2015 Cath: nl cors, PCWP 18 mmHg, CO 9.38 L/min, CI 3.53 L/min/m^2.   CHF (congestive heart failure) (HCC)    Depression    Situational   ESRD (end stage renal disease) (HCC)    [facility]- [place]   Essential hypertension    Family history of adverse reaction to anesthesia    sister- "it was too much for her heart" died   Flash pulmonary edema (HCC) 09/18/2022   GERD (gastroesophageal reflux disease)    Hyperlipidemia    Membranous glomerulonephritis    Morbid obesity (HCC)    Nonischemic cardiomyopathy (HCC)    a. 01/2015 Echo: EF 20-25%, diff HK, Gr 2 DD, Triv AI, mildly dil LA and Ao root.   PONV (postoperative nausea and vomiting)    Seizures (HCC) 02/29/2020   "electralytes were out of wack."   Type II diabetes mellitus (HCC)    a. 01/2015 HbA1c =  8.9.   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) 10/15/2023   Shortness of breath 10/15/2023   Lone post-op atrial fibrillation (HCC), resolved 09/06/2023   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) 09/05/2023   Diabetic foot infection (HCC) 09/04/2023   Type 2 diabetes mellitus with chronic kidney disease on chronic dialysis, with long-term current use of insulin (HCC) 09/04/2023   Abscess of left foot 09/04/2023   Pyogenic inflammation of bone (HCC) 09/04/2023   Caries 07/23/2022   Periodontal disease 07/23/2022   Loose, teeth 07/23/2022   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   Sepsis without acute organ dysfunction (HCC) 07/22/2022   HF with recovered EF 07/21/2022   Bacteremia due to Enterococcus 07/21/2022   ESRD on dialysis (HCC) 07/20/2022   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   Aortic valve insufficiency 07/18/2022   Catheter-related bloodstream infection 07/16/2022   Anemia, unspecified 10/26/2019   Coagulation defect, unspecified (HCC) 10/21/2019   Secondary hyperparathyroidism of  renal origin (HCC) 10/21/2019   Mass of soft tissue of face 09/23/2018   Onychomycosis due to dermatophyte 03/29/2018   Anemia, iron deficiency 07/18/2016   Anemia in chronic kidney disease 07/18/2016   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, stage 5 (HCC) 05/29/2016   Scrotal abscess 11/21/2015   Suspected sleep apnea 05/24/2015   Chronic combined systolic and diastolic CHF (congestive heart failure) (HCC) 03/01/2015   Nonischemic cardiomyopathy (HCC)     Asthma    Hyperlipidemia 03/31/2012   Obesity, Class I, BMI 30-34.9 03/21/2011   Primary hypertension 03/21/2011   Gastroesophageal reflux disease 03/21/2011   Type 2 diabetes mellitus with diabetic nephropathy (HCC) 02/20/2011   Home Medication(s) Prior to Admission medications   Medication Sig Start Date End Date Taking? Authorizing Provider  acetaminophen (TYLENOL) 500 MG tablet Take 1,000 mg by mouth every 6 (six) hours as needed for moderate pain.   Yes [provider]  atorvastatin (LIPITOR) 80 MG tablet Take 1 tablet (80 mg total) by mouth daily. 01/06/23  Yes Bensimhon, Bevelyn Buckles, MD  carvedilol (COREG) 25 MG tablet Take 2 tablets (50 mg total) by mouth 2 (two) times daily. 10/15/23  Yes Masters, Katie, DO  ciprofloxacin (CIPRO) 500 MG tablet Take 1 tablet (500 mg total) by mouth 2 (two) times daily for 10 days. 10/21/23 10/31/23 Yes Standiford, Jenelle Mages, DPM  hydrALAZINE (APRESOLINE) 50 MG tablet Take 1 tablet (50 mg total) by mouth 3 (three) times daily. 10/15/23  Yes Masters, Katie, DO  insulin aspart (NOVOLOG FLEXPEN) 100 UNIT/ML FlexPen Inject 15 Units into the skin 3 (three) times daily with meals. Patient taking differently: Inject 17 Units into the skin 3 (three) times daily with meals. 07/07/23  Yes   insulin glargine-yfgn (SEMGLEE, YFGN,) 100 UNIT/ML Pen Inject 20 Units into the skin at bedtime. 09/19/22  Yes Masters, Katie, DO  multivitamin (RENA-VIT) TABS tablet Take 1 tablet by mouth daily.   Yes [provider]  ondansetron (ZOFRAN) 4 MG tablet Take 1 tablet by mouth once a day as needed Patient taking differently: Take 4 mg by mouth 3 (three) times a week. 10/14/23  Yes   pantoprazole (PROTONIX) 20 MG tablet Take 1 tablet (20 mg total) by mouth daily as directed for reflux. 07/23/23  Yes   sevelamer carbonate (RENVELA) 800 MG tablet Take 3 tablets (2,400 mg total) by mouth 3 (three) times daily with meals 09/30/23  Yes   Accu-Chek Softclix Lancets lancets Use as  directed four times daily. 10/15/23     blood glucose meter kit and supplies KIT Use to check blood sugar up to four times daily as directed. 10/15/23   Masters, Katie, DO  glucose blood (ACCU-CHEK GUIDE TEST) test strip Use as directed four times daily. 10/15/23     nitroGLYCERIN (NITROSTAT) 0.4 MG SL tablet Place 1 tablet (0.4 mg total) under the tongue every 5 (five) minutes x 3 doses as needed within 15 minutes. If chest pain does not resolve, seek medical attention. Patient not taking: Reported on 10/26/2023 07/23/23     ondansetron (ZOFRAN) 4 MG tablet Take 1 tablet by mouth once a day as needed Patient not taking: Reported on 10/26/2023 07/23/23  Past Surgical History Past Surgical History:  Procedure Laterality Date   A/V FISTULAGRAM N/A 08/12/2021   Procedure: A/V FISTULAGRAM;  Surgeon: Maeola Harman, MD;  [facility] INVAS[facility] CV LAB;  Service: Cardiovascular;  Laterality: N/A;   AV FISTULA PLACEMENT Right 12/18/2020   Procedure: RIGHT ARM RADIOCEPHALIC  ARTERIOVENOUS (AV) FISTULA CREATION;  Surgeon: Maeola Harman, MD;  Location: [facility] OR;  Service: Vascular;  Laterality: Right;   CARDIAC CATHETERIZATION N/A 02/01/2015   Procedure: Right/Left Heart Cath and Coronary Angiography;  Surgeon: Wendall Stade, MD;  Location: [facility] INVASIVE CV LAB;  Service: Cardiovascular;  Laterality: N/A;   INCISION AND DRAINAGE Left 09/05/2023   Procedure: INCISION AND DRAINAGE WITH 3RD TOE METATARSAL RESECTION LEFT FOOT AND 4TH TOE PROXIMAL PHALANX RESECTION;  Surgeon: Pilar Plate, DPM;  Location: [facility] OR;  Service: Orthopedics/Podiatry;  Laterality: Left;  I&D with possible metatarsal resection, left foot   IR FLUORO GUIDE CV LINE LEFT  07/21/2022   IR FLUORO GUIDE CV LINE LEFT  07/21/2022   IR FLUORO GUIDE CV LINE RIGHT  10/11/2019   IR REMOVAL TUN CV CATH  W/O FL  07/17/2022   IR REMOVAL TUN CV CATH W/O FL  08/18/2022   IR US GUIDE VASC ACCESS LEFT  07/21/2022   IR US GUIDE VASC ACCESS LEFT  07/21/2022   IR US GUIDE VASC ACCESS RIGHT  10/11/2019   TEE WITHOUT CARDIOVERSION N/A 07/18/2022   Procedure: TRANSESOPHAGEAL ECHOCARDIOGRAM (TEE);  Surgeon: Maisie Fus, MD;  Location: [facility] ENDOSCOPY;  Service: Cardiovascular;  Laterality: N/A;   THORACOTOMY Left 11/08/2013   Procedure: LEFT THORACOTOMY;  Surgeon: Kerin Perna, MD;  Location: [facility] OR;  Service: Thoracic;  Laterality: Left;   Family History Family History  Problem Relation Age of Onset   Diabetes Mellitus II Mother        died @ 34.   Gastric cancer Mother    CAD Father        died @ 39.   Heart attack Father    Congestive Heart Failure Father    Diabetes Mellitus II Sister    CAD Sister        s/p PCI - age 47.    Social History Social History   Tobacco Use   Smoking status: Never   Smokeless tobacco: Never  Vaping Use   Vaping status: Never Used  Substance Use Topics   Alcohol use: No    Alcohol/week: 0.0 standard drinks of alcohol   Drug use: Yes    Types: Marijuana    Comment: last used 09/15/2020   Allergies Acyclovir and related and Imdur [isosorbide nitrate]  Review of Systems Review of Systems  Respiratory:  Positive for cough and shortness of breath.   Gastrointestinal:  Positive for nausea and vomiting.    Physical Exam Vital Signs  I have reviewed the triage vital signs BP 114/88   Pulse (!) 102   Temp 98.7 F (37.1 C) (Oral)   Resp 10   Ht 6\' 5"  (1.956 m)   Wt 108.9 kg   SpO2 95%   BMI 28.46 kg/m   Physical Exam Constitutional:      General: He is not in acute distress.    Appearance: Normal appearance.  HENT:     Head: Normocephalic and atraumatic.     Nose: No congestion or rhinorrhea.  Eyes:     General:        Right eye: No discharge.  Left eye: No discharge.     Extraocular Movements: Extraocular movements intact.      Pupils: Pupils are equal, round, and reactive to light.  Cardiovascular:     Rate and Rhythm: Normal rate and regular rhythm.     Heart sounds: No murmur heard. Pulmonary:     Effort: Tachypnea and respiratory distress present.     Breath sounds: Rales present. No wheezing.  Abdominal:     General: There is no distension.     Tenderness: There is no abdominal tenderness.  Musculoskeletal:        General: Normal range of motion.     Cervical back: Normal range of motion.  Skin:    General: Skin is warm and dry.  Neurological:     General: No focal deficit present.     Mental Status: He is alert.     ED Results and Treatments Labs (all labs ordered are listed, but only abnormal results are displayed) Labs Reviewed  CBC WITH DIFFERENTIAL/PLATELET - Abnormal; Notable for the following components:      Result Value   WBC 15.1 (*)    RDW 21.8 (*)    Neutro Abs 13.3 (*)    All other components within normal limits  BRAIN NATRIURETIC PEPTIDE - Abnormal; Notable for the following components:   B Natriuretic Peptide 859.3 (*)    All other components within normal limits  COMPREHENSIVE METABOLIC PANEL - Abnormal; Notable for the following components:   CO2 21 (*)    Glucose, Bld 184 (*)    BUN 94 (*)    Creatinine, Ser 13.28 (*)    Albumin 3.1 (*)    GFR, Estimated 4 (*)    Anion gap 18 (*)    All other components within normal limits  CREATININE, SERUM - Abnormal; Notable for the following components:   Creatinine, Ser 13.45 (*)    GFR, Estimated 4 (*)    All other components within normal limits  LACTIC ACID, PLASMA - Abnormal; Notable for the following components:   Lactic Acid, Venous 2.1 (*)    All other components within normal limits  CBC - Abnormal; Notable for the following components:   WBC 12.8 (*)    RDW 21.3 (*)    All other components within normal limits  I-STAT CHEM 8, ED - Abnormal; Notable for the following components:   BUN 84 (*)    Creatinine, Ser  13.60 (*)    Glucose, Bld 180 (*)    Calcium, Ion 1.06 (*)    All other components within normal limits  CBG MONITORING, ED - Abnormal; Notable for the following components:   Glucose-Capillary 124 (*)    All other components within normal limits  CBG MONITORING, ED - Abnormal; Notable for the following components:   Glucose-Capillary 211 (*)    All other components within normal limits  TROPONIN I (HIGH SENSITIVITY) - Abnormal; Notable for the following components:   Troponin I (High Sensitivity) 54 (*)    All other components within normal limits  TROPONIN I (HIGH SENSITIVITY) - Abnormal; Notable for the following components:   Troponin I (High Sensitivity) 95 (*)    All other components within normal limits  TROPONIN I (HIGH SENSITIVITY) - Abnormal; Notable for the following components:   Troponin I (High Sensitivity) 112 (*)    All other components within normal limits  RESP PANEL BY RT-PCR (RSV, FLU A&B, COVID)  [id number]  CULTURE, BLOOD (ROUTINE X 2)  CULTURE, BLOOD (ROUTINE  X 2)  HEPATITIS B SURFACE ANTIGEN  LACTIC ACID, PLASMA  BETA-HYDROXYBUTYRIC ACID  HEPATITIS B SURFACE ANTIBODY, QUANTITATIVE  URINALYSIS, ROUTINE W REFLEX MICROSCOPIC  BLOOD GAS, VENOUS  LIPOPROTEIN A (LPA)  RENAL FUNCTION PANEL  CBC WITH DIFFERENTIAL/PLATELET  LIPID PANEL  TROPONIN I (HIGH SENSITIVITY)  TROPONIN I (HIGH SENSITIVITY)                                                                                                                          Radiology VAS [place] ABI WITH/WO TBI Result Date: 10/26/2023  LOWER EXTREMITY DOPPLER STUDY Patient Name:  GODSON POLLAN  Date of Exam:   10/26/2023 Medical Rec #: [id number]        Accession #:    [id number] Date of Birth: 02-12-1981        Patient Gender: M Patient Age:   49 years Exam Location:  [facility] Procedure:      VAS [place] ABI WITH/WO TBI Referring Phys: Lyn Hollingshead STANDIFORD  --------------------------------------------------------------------------------  Indications: Ulceration. High Risk Factors: Hypertension, hyperlipidemia, Diabetes.  Limitations: Today's exam was limited due to Great toe girth, involuntary              patient movement, an open wound and bandages. Comparison Study: No prior studies. Performing Technologist: Olen Cordial RVT  Examination Guidelines: A complete evaluation includes at minimum, Doppler waveform signals and systolic blood pressure reading at the level of bilateral brachial, anterior tibial, and posterior tibial arteries, when vessel segments are accessible. Bilateral testing is considered an integral part of a complete examination. Photoelectric Plethysmograph (PPG) waveforms and toe systolic pressure readings are included as required and additional duplex testing as needed. Limited examinations for reoccurring indications may be performed as noted.  ABI Findings: +--------+------------------+-----+---------+--------+ Right   Rt Pressure (mmHg)IndexWaveform Comment  +--------+------------------+-----+---------+--------+ [id number]                    triphasic         +--------+------------------+-----+---------+--------+ PTA     219               1.34 triphasic         +--------+------------------+-----+---------+--------+ DP      229               1.40 triphasic         +--------+------------------+-----+---------+--------+ +--------+------------------+-----+-----------+-------+ Left    Lt Pressure (mmHg)IndexWaveform   Comment +--------+------------------+-----+-----------+-------+ [id number]                    triphasic          +--------+------------------+-----+-----------+-------+ PTA     242               1.48 multiphasic        +--------+------------------+-----+-----------+-------+ DP      244               1.50 multiphasic        +--------+------------------+-----+-----------+-------+  +-------+-----------+-----------+------------+------------+ ABI/TBIToday's  ABIToday's TBIPrevious ABIPrevious TBI +-------+-----------+-----------+------------+------------+ Right  1.4                                            +-------+-----------+-----------+------------+------------+ Left   1.5                                            +-------+-----------+-----------+------------+------------+  Summary: Right: Resting right ankle-brachial index indicates noncompressible right lower extremity arteries. Unable to obtain TBI due to great toe girth. Left: Resting left ankle-brachial index indicates noncompressible left lower extremity arteries. Unable to obtain TBI due to great toe girth. *See table(s) above for measurements and observations.  Electronically signed by Sherald Hess MD on 10/26/2023 at 4:47:59 PM.    Final    ECHOCARDIOGRAM COMPLETE Result Date: 10/26/2023    ECHOCARDIOGRAM REPORT   Patient Name:   RADEN BYINGTON Date of Exam: 10/26/2023 Medical Rec #:  [id number]       Height:       77.0 in Accession #:    [id number]      Weight:       240.0 lb Date of Birth:  Mar 11, 1981       BSA:          2.417 m Patient Age:    42 years        BP:           1331/61 mmHg Patient Gender: M               HR:           110 bpm. Exam Location:  Inpatient Procedure: 2D Echo, Cardiac Doppler and Color Doppler (Both Spectral and Color            Flow Doppler were utilized during procedure). Indications:    Dyspnea  History:        Patient has prior history of Echocardiogram examinations, most                 recent 07/18/2022. CHF and Cardiomyopathy; Risk                 Factors:Diabetes.  Sonographer:    Darlys Gales Referring Phys: [id number] JULIE MACHEN IMPRESSIONS  1. Left ventricular ejection fraction, by estimation, is 35 to 40%. The left ventricle has moderately decreased function. The left ventricle demonstrates global hypokinesis. The left ventricular internal cavity size was severely  dilated. Left ventricular diastolic function could not be evaluated.  2. Right ventricular systolic function is normal. The right ventricular size is normal. Tricuspid regurgitation signal is inadequate for assessing PA pressure.  3. The mitral valve is normal in structure. Mild mitral valve regurgitation. No evidence of mitral stenosis.  4. Tricuspid valve regurgitation is mild to moderate.  5. The aortic valve is abnormal.     The right and non coronary cusps have focal calcifications that are concerning for possible vegetations. Aortic valve regurgitation is severe. Aortic valve sclerosis/calcification is present, without any evidence of aortic stenosis. Aortic valve mean  gradient measures 5.0 mmHg. Aortic valve Vmax measures 1.46 m/s.  6. The inferior vena cava is normal in size with greater than 50% respiratory variability, suggesting right atrial pressure of 3 mmHg.  7. In the PSAX view at the level of the  AV, there is a jet by colorflow perpendicular to the aortic valve into the LVOT. Unclear if this is eccentric AI an eccentric TR in this area or actually a fistula.  8. Recommend TEE for further evaluation of AV leaflets and AR. FINDINGS  Left Ventricle: Left ventricular ejection fraction, by estimation, is 35 to 40%. The left ventricle has moderately decreased function. The left ventricle demonstrates global hypokinesis. Strain imaging was not performed. The left ventricular internal cavity size was severely dilated. There is no left ventricular hypertrophy. Left ventricular diastolic function could not be evaluated. Right Ventricle: The right ventricular size is normal. No increase in right ventricular wall thickness. Right ventricular systolic function is normal. Tricuspid regurgitation signal is inadequate for assessing PA pressure. Left Atrium: Left atrial size was normal in size. Right Atrium: Right atrial size was normal in size. Pericardium: There is no evidence of pericardial effusion. Mitral  Valve: The mitral valve is normal in structure. Mild mitral valve regurgitation. No evidence of mitral valve stenosis. Tricuspid Valve: The tricuspid valve is normal in structure. Tricuspid valve regurgitation is mild to moderate. No evidence of tricuspid stenosis. Aortic Valve: The right and non coronary cusps have focal calcifications. Cannot rule out vegetations. The aortic valve is abnormal. Aortic valve regurgitation is severe. Aortic regurgitation PHT measures 176 msec. Aortic valve sclerosis/calcification is  present, without any evidence of aortic stenosis. Aortic valve mean gradient measures 5.0 mmHg. Aortic valve peak gradient measures 8.5 mmHg. Pulmonic Valve: The pulmonic valve was normal in structure. Pulmonic valve regurgitation is not visualized. No evidence of pulmonic stenosis. Aorta: The aortic root is normal in size and structure. Venous: The inferior vena cava is normal in size with greater than 50% respiratory variability, suggesting right atrial pressure of 3 mmHg. IAS/Shunts: No atrial level shunt detected by color flow Doppler. Additional Comments: 3D imaging was not performed.  LEFT VENTRICLE PLAX 2D LVIDd:         7.00 cm Diastology LVIDs:         6.00 cm LV e' lateral:   7.72 cm/s LV PW:         1.20 cm LV E/e' lateral: 14.8 LV IVS:        1.10 cm  RIGHT VENTRICLE RV S prime:     10.00 cm/s TAPSE (M-mode): 1.8 cm LEFT ATRIUM              Index        RIGHT ATRIUM          Index LA Vol (A2C):   122.0 ml 50.48 ml/m  RA Area:     8.91 cm LA Vol (A4C):   63.3 ml  26.19 ml/m  RA Volume:   13.70 ml 5.67 ml/m LA Biplane Vol: 91.0 ml  37.65 ml/m  AORTIC VALVE AV Vmax:           146.00 cm/s AV Vmean:          107.000 cm/s AV VTI:            0.236 m AV Peak Grad:      8.5 mmHg AV Mean Grad:      5.0 mmHg LVOT Vmax:         133.00 cm/s LVOT Vmean:        99.800 cm/s LVOT VTI:          0.228 m LVOT/AV VTI ratio: 0.97 AI PHT:            176 msec MITRAL  VALVE MV Area (PHT): 5.42 cm     SHUNTS MV  Decel Time: 140 msec     Systemic VTI: 0.23 m MV E velocity: 114.00 cm/s Armanda Magic MD Electronically signed by Armanda Magic MD Signature Date/Time: 10/26/2023/2:12:58 PM    Final    DG Chest Portable 1 View Result Date: 10/26/2023 CLINICAL DATA:  Shortness of breath EXAM: PORTABLE CHEST 1 VIEW COMPARISON:  10/12/2023 FINDINGS: Cardiac shadow is enlarged but stable. Dialysis catheter is again seen. Increasing vascular congestion is noted consistent with fluid overload. Some parenchymal opacity is noted in the bases which may represent edema. IMPRESSION: Changes of CHF likely related to volume overload. Electronically Signed   By: Alcide Clever M.D.   On: 10/26/2023 03:51    Pertinent labs & imaging results that were available during my care of the patient were reviewed by me and considered in my medical decision making (see MDM for details).  Medications Ordered in ED Medications  Chlorhexidine Gluconate Cloth 2 % PADS 6 each (6 each Topical Not Given 10/26/23 0550)  sevelamer carbonate (RENVELA) tablet 2,400 mg (2,400 mg Oral Given 10/26/23 1846)  heparin injection 5,000 Units (5,000 Units Subcutaneous Given 10/26/23 1322)  insulin aspart (novoLOG) injection 0-15 Units (5 Units Subcutaneous Given 10/26/23 1846)  acetaminophen (TYLENOL) tablet 1,000 mg (1,000 mg Oral Given 10/26/23 1610)  atorvastatin (LIPITOR) tablet 80 mg (80 mg Oral Given 10/26/23 1321)  carvedilol (COREG) tablet 50 mg (50 mg Oral Given 10/26/23 1557)  pantoprazole (PROTONIX) EC tablet 40 mg (40 mg Oral Given 10/26/23 1321)  ondansetron (ZOFRAN) tablet 4 mg (4 mg Oral Given 10/26/23 0802)  hydrALAZINE (APRESOLINE) tablet 50 mg (50 mg Oral Given 10/26/23 1557)  insulin glargine-yfgn (SEMGLEE) injection 20 Units (has no administration in time range)  LORazepam (ATIVAN) injection 0.5 mg (0 mg Intravenous Hold 10/26/23 1849)  ceFEPIme (MAXIPIME) 1 g in sodium chloride 0.9 % 100 mL IVPB (0 g Intravenous Stopped 10/26/23 1707)  vancomycin  (VANCOCIN) IVPB 1000 mg/200 mL premix (has no administration in time range)  lidocaine (LIDODERM) 5 % 1 patch (1 patch Transdermal Patch Applied 10/26/23 1924)  ondansetron (ZOFRAN) injection 4 mg (4 mg Intravenous Given 10/26/23 0347)  LORazepam (ATIVAN) injection 0.5 mg (0.5 mg Intravenous Given 10/26/23 0359)  prochlorperazine (COMPAZINE) injection 10 mg (10 mg Intravenous Given 10/26/23 0439)  diphenhydrAMINE (BENADRYL) injection 25 mg (25 mg Intravenous Given 10/26/23 0439)  heparin injection 4,000 Units (4,000 Units Dialysis Given 10/26/23 0825)  heparin injection 3,000 Units (3,000 Units Dialysis Given 10/26/23 1022)  heparin sodium (porcine) injection 4,600 Units (4,600 Units Intravenous Given 10/26/23 1127)  furosemide (LASIX) injection 40 mg (40 mg Intravenous Given 10/26/23 1558)  vancomycin (VANCOREADY) IVPB 2000 mg/400 mL (2,000 mg Intravenous New Bag/Given 10/26/23 1727)  Procedures .Critical Care  Performed by: Glendora Score, MD Authorized by: Glendora Score, MD   Critical care provider statement:    Critical care time (minutes):  30   Critical care was necessary to treat or prevent imminent or life-threatening deterioration of the following conditions:  Respiratory failure   Critical care was time spent personally by me on the following activities:  Development of treatment plan with patient or surrogate, discussions with consultants, evaluation of patient's response to treatment, examination of patient, ordering and review of laboratory studies, ordering and review of radiographic studies, ordering and performing treatments and interventions, pulse oximetry, re-evaluation of patient's condition and review of old charts   (including critical care time)  Medical Decision Making / ED Course   This patient presents to the ED for concern of shortness of  breath, this involves an extensive number of treatment options, and is a complaint that carries with it a high risk of complications and morbidity.  The differential diagnosis includes Pe, PTX, Pulmonary Edema, ARDS, COPD/Asthma, ACS, CHF exacerbation, Arrhythmia, Pericardial Effusion/Tamponade, Anemia, Sepsis, Acidosis/Hypercapnia, Anxiety, Viral URI  MDM: Patient seen emergency room for evaluation of shortness of breath.  Physical exam reveals an ill-appearing tachypneic patient with rales at the bases bilaterally and mild accessory muscle use.  Laboratory evaluation with leukocytosis to 15.1, BNP 859.3, BUN 94, creatinine 13.28, albumin 3.1, COVID, flu, RSV negative.  Chest x-ray concerning for fluid overload.  We were initially able to transition the patient from a nonrebreather mask 10 L high flow nasal cannula.  Patient did agree to a short trial of BiPAP with Ativan for air hunger but unfortunately patient had another episode of nausea and vomiting despite antiemetics.  BiPAP discontinued and patient is hemodynamically stable back on a 10 L nasal cannula.  I spoke with Dr. Arlean Hopping of nephrology who will help prioritize dialysis for the patient.  Patient admitted due to the significant new oxygen requirement and fluid overload.   Additional history obtained: -Additional history obtained from sister -External records from outside source obtained and reviewed including: Chart review including previous notes, labs, imaging, consultation notes   Lab Tests: -I ordered, reviewed, and interpreted labs.   The pertinent results include:   Labs Reviewed  CBC WITH DIFFERENTIAL/PLATELET - Abnormal; Notable for the following components:      Result Value   WBC 15.1 (*)    RDW 21.8 (*)    Neutro Abs 13.3 (*)    All other components within normal limits  BRAIN NATRIURETIC PEPTIDE - Abnormal; Notable for the following components:   B Natriuretic Peptide 859.3 (*)    All other components within normal  limits  COMPREHENSIVE METABOLIC PANEL - Abnormal; Notable for the following components:   CO2 21 (*)    Glucose, Bld 184 (*)    BUN 94 (*)    Creatinine, Ser 13.28 (*)    Albumin 3.1 (*)    GFR, Estimated 4 (*)    Anion gap 18 (*)    All other components within normal limits  CREATININE, SERUM - Abnormal; Notable for the following components:   Creatinine, Ser 13.45 (*)    GFR, Estimated 4 (*)    All other components within normal limits  LACTIC ACID, PLASMA - Abnormal; Notable for the following components:   Lactic Acid, Venous 2.1 (*)    All other components within normal limits  CBC - Abnormal; Notable for the following components:   WBC 12.8 (*)    RDW 21.3 (*)  All other components within normal limits  I-STAT CHEM 8, ED - Abnormal; Notable for the following components:   BUN 84 (*)    Creatinine, Ser 13.60 (*)    Glucose, Bld 180 (*)    Calcium, Ion 1.06 (*)    All other components within normal limits  CBG MONITORING, ED - Abnormal; Notable for the following components:   Glucose-Capillary 124 (*)    All other components within normal limits  CBG MONITORING, ED - Abnormal; Notable for the following components:   Glucose-Capillary 211 (*)    All other components within normal limits  TROPONIN I (HIGH SENSITIVITY) - Abnormal; Notable for the following components:   Troponin I (High Sensitivity) 54 (*)    All other components within normal limits  TROPONIN I (HIGH SENSITIVITY) - Abnormal; Notable for the following components:   Troponin I (High Sensitivity) 95 (*)    All other components within normal limits  TROPONIN I (HIGH SENSITIVITY) - Abnormal; Notable for the following components:   Troponin I (High Sensitivity) 112 (*)    All other components within normal limits  RESP PANEL BY RT-PCR (RSV, FLU A&B, COVID)  RVPGX2  CULTURE, BLOOD (ROUTINE X 2)  CULTURE, BLOOD (ROUTINE X 2)  HEPATITIS B SURFACE ANTIGEN  LACTIC ACID, PLASMA  BETA-HYDROXYBUTYRIC ACID   HEPATITIS B SURFACE ANTIBODY, QUANTITATIVE  URINALYSIS, ROUTINE W REFLEX MICROSCOPIC  BLOOD GAS, VENOUS  LIPOPROTEIN A (LPA)  RENAL FUNCTION PANEL  CBC WITH DIFFERENTIAL/PLATELET  LIPID PANEL  TROPONIN I (HIGH SENSITIVITY)  TROPONIN I (HIGH SENSITIVITY)      Imaging Studies ordered: I ordered imaging studies including chest x-ray I independently visualized and interpreted imaging. I agree with the radiologist interpretation   Medicines ordered and prescription drug management: Meds ordered this encounter  Medications   ondansetron (ZOFRAN) injection 4 mg   LORazepam (ATIVAN) injection 0.5 mg   prochlorperazine (COMPAZINE) injection 10 mg   diphenhydrAMINE (BENADRYL) injection 25 mg   Chlorhexidine Gluconate Cloth 2 % PADS 6 each   DISCONTD: pentafluoroprop-tetrafluoroeth (GEBAUERS) aerosol 1 Application   DISCONTD: lidocaine (PF) (XYLOCAINE) 1 % injection 5 mL   DISCONTD: lidocaine-prilocaine (EMLA) cream 1 Application   DISCONTD: feeding supplement (NEPRO CARB STEADY) liquid 237 mL   DISCONTD: heparin injection 1,000 Units   DISCONTD: anticoagulant sodium citrate solution 5 mL   DISCONTD: alteplase (CATHFLO ACTIVASE) injection 2 mg   heparin injection 4,000 Units   heparin injection 3,000 Units   sevelamer carbonate (RENVELA) tablet 2,400 mg   heparin injection 5,000 Units   insulin aspart (novoLOG) injection 0-15 Units    Correction coverage::   Moderate (average weight, post-op)    CBG < 70::   Implement Hypoglycemia Standing Orders and refer to Hypoglycemia Standing Orders sidebar report    CBG 70 - 120::   0 units    CBG 121 - 150::   2 units    CBG 151 - 200::   3 units    CBG 201 - 250::   5 units    CBG 251 - 300::   8 units    CBG 301 - 350::   11 units    CBG 351 - 400::   15 units    CBG > 400:   call MD and obtain STAT lab verification   acetaminophen (TYLENOL) tablet 1,000 mg   atorvastatin (LIPITOR) tablet 80 mg   carvedilol (COREG) tablet 50 mg    DISCONTD: insulin glargine-yfgn (SEMGLEE) Pen 20 Units   pantoprazole (  PROTONIX) EC tablet 40 mg   ondansetron (ZOFRAN) tablet 4 mg   hydrALAZINE (APRESOLINE) tablet 50 mg   DISCONTD: ciprofloxacin (CIPRO) tablet 500 mg   DISCONTD: ciprofloxacin (CIPRO) tablet 500 mg   insulin glargine-yfgn (SEMGLEE) injection 20 Units   heparin sodium (porcine) injection 4,600 Units   furosemide (LASIX) injection 40 mg   DISCONTD: LORazepam (ATIVAN) bolus via infusion 0.5 mg   LORazepam (ATIVAN) injection 0.5 mg   ceFEPIme (MAXIPIME) 1 g in sodium chloride 0.9 % 100 mL IVPB    Antibiotic Indication::   Osteomyelitis   vancomycin (VANCOREADY) IVPB 2000 mg/400 mL    Indication::   Osteomyelitis   vancomycin (VANCOCIN) IVPB 1000 mg/200 mL premix    Indication::   Osteomyelitis   DISCONTD: HYDROmorphone (DILAUDID) tablet 1 mg    Refill:  0   lidocaine (LIDODERM) 5 % 1 patch    -I have reviewed the patients home medicines and have made adjustments as needed  Critical interventions Oxygen supplementation, BiPAP  Consultations Obtained: I requested consultation with the nephrologist on-call,  and discussed lab and imaging findings as well as pertinent plan - they recommend: Dialysis   Cardiac Monitoring: The patient was maintained on a cardiac monitor.  I personally viewed and interpreted the cardiac monitored which showed an underlying rhythm of: NSR  Social Determinants of Health:  Factors impacting patients care include: none   Reevaluation: After the interventions noted above, I reevaluated the patient and found that they have :improved  Co morbidities that complicate the patient evaluation  Past Medical History:  Diagnosis Date   Abscess of left groin    Acute blood loss anemia 11/11/2013   Anemia in chronic kidney disease (CKD)    Asthma    Boil of scrotum 11/21/2015   Chest pain    a. 01/2015 Lexiscan MV: EF 28%, inferior, inferolateral, apical ischemia;  b. 01/2015 Cath: nl cors,  PCWP 18 mmHg, CO 9.38 L/min, CI 3.53 L/min/m^2.   CHF (congestive heart failure) (HCC)    Depression    Situational   ESRD (end stage renal disease) (HCC)    [facility]THS- [place]   Essential hypertension    Family history of adverse reaction to anesthesia    sister- "it was too much for her heart" died   Flash pulmonary edema (HCC) 09/18/2022   GERD (gastroesophageal reflux disease)    Hyperlipidemia    Membranous glomerulonephritis    Morbid obesity (HCC)    Nonischemic cardiomyopathy (HCC)    a. 01/2015 Echo: EF 20-25%, diff HK, Gr 2 DD, Triv AI, mildly dil LA and Ao root.   PONV (postoperative nausea and vomiting)    Seizures (HCC) 02/29/2020   "electralytes were out of wack."   Type II diabetes mellitus (HCC)    a. 01/2015 HbA1c = 8.9.      Dispostion: I considered admission for this patient, and patient will require hospital admission for fluid overload and need for emergent dialysis     Final Clinical Impression(s) / ED Diagnoses Final diagnoses:  Other hypervolemia     @PCDICTATION @    Glendora Score, MD 10/26/23 2117

## 2023-10-26 NOTE — Progress Notes (Signed)
 ABI's have been completed. Preliminary results can be found in CV Proc through chart review.   10/26/23 4:32 PM Olen Cordial RVT

## 2023-10-26 NOTE — ED Triage Notes (Signed)
 Patient BIBA from home after waking up with severe SOB. Hx: CHF, CKD with dialysis MWF. EMS attempted to put patient on Cpap but was not tolerating the mask. Patient was 72% on RA. On NRB at 15 L on arrival and satting high 90s. Compliant with dialysis. Is not on Lasix. 20 gauge in L hand. No meds from EMS.  BP- 186/96 HR-116  O2- 95% on NRB BGL-245

## 2023-10-26 NOTE — ED Notes (Signed)
 Patient transported to MRI

## 2023-10-26 NOTE — ED Notes (Addendum)
 Patient transported to Ultrasound, holding lasix until patient returns to ED. Patient also reports needing food for Renvela, it si held as well.

## 2023-10-26 NOTE — Consult Note (Addendum)
 Renal Service Consult Note [facility]  Jeremy [name] 10/26/2023 Jeremy Krabbe, MD Requesting Physician: Dr. Lafonda Mosses, [facility].   Reason for Consult: ESRD pt w/ resp distress HPI: The patient is a 43 y.o. year-old w/ PMH as below who presented to ED c/o severe SOB. Last HD was Friday. In ED was 72% on RA, on NRB at 15L sats were high 90s. BP 186/98, HR 116. CXR shows sig bilat pulm edema. Has not missed HD. He was tried on bipap but was too somnolent and is now on 10 L [place] O2. Reports that his lasix was recently dc'd and he is 20 lbs up. Pt admitted to medical service. We are asked to see for dialysis.    Pt seen in ED room. Mild ^wob, not in distress but doesn't look comfortable. No CP, no fevers, or chills, no missed HD.    PMH HFpEF Osteomyelitis L foot ESRD on HD MWF DM2 on insulin    ROS - denies CP, no joint pain, no HA, no blurry vision, no rash, no diarrhea, no nausea/ vomiting, no dysuria, no difficulty voiding   Past Medical History  Past Medical History:  Diagnosis Date   Abscess of left groin    Acute blood loss anemia 11/11/2013   Anemia in chronic kidney disease (CKD)    Asthma    Boil of scrotum 11/21/2015   Chest pain    a. 01/2015 Lexiscan MV: EF 28%, inferior, inferolateral, apical ischemia;  b. 01/2015 Cath: nl cors, PCWP 18 mmHg, CO 9.38 L/min, CI 3.53 L/min/m^2.   CHF (congestive heart failure) (HCC)    Depression    Situational   ESRD (end stage renal disease) (HCC)    [facility]- [place]   Essential hypertension    Family history of adverse reaction to anesthesia    sister- "it was too much for her heart" died   Flash pulmonary edema (HCC) 09/18/2022   GERD (gastroesophageal reflux disease)    Hyperlipidemia    Membranous glomerulonephritis    Morbid obesity (HCC)    Nonischemic cardiomyopathy (HCC)    a. 01/2015 Echo: EF 20-25%, diff HK, Gr 2 DD, Triv AI, mildly dil LA and Ao root.   PONV (postoperative nausea and vomiting)     Seizures (HCC) 02/29/2020   "electralytes were out of wack."   Type II diabetes mellitus (HCC)    a. 01/2015 HbA1c = 8.9.   Past Surgical History  Past Surgical History:  Procedure Laterality Date   A/V FISTULAGRAM N/A 08/12/2021   Procedure: A/V FISTULAGRAM;  Surgeon: Maeola Harman, MD;  Location: [facility] INVASIVE CV LAB;  Service: Cardiovascular;  Laterality: N/A;   AV FISTULA PLACEMENT Right 12/18/2020   Procedure: RIGHT ARM RADIOCEPHALIC  ARTERIOVENOUS (AV) FISTULA CREATION;  Surgeon: Maeola Harman, MD;  Location: [facility] OR;  Service: Vascular;  Laterality: Right;   CARDIAC CATHETERIZATION N/A 02/01/2015   Procedure: Right/Left Heart Cath and Coronary Angiography;  Surgeon: Wendall Stade, MD;  Location: [facility] INVASIVE CV LAB;  Service: Cardiovascular;  Laterality: N/A;   INCISION AND DRAINAGE Left 09/05/2023   Procedure: INCISION AND DRAINAGE WITH 3RD TOE METATARSAL RESECTION LEFT FOOT AND 4TH TOE PROXIMAL PHALANX RESECTION;  Surgeon: Pilar Plate, DPM;  Location: [facility] OR;  Service: Orthopedics/Podiatry;  Laterality: Left;  I&D with possible metatarsal resection, left foot   IR FLUORO GUIDE CV LINE LEFT  07/21/2022   IR FLUORO GUIDE CV LINE LEFT  07/21/2022   IR FLUORO GUIDE CV LINE  RIGHT  10/11/2019   IR REMOVAL TUN CV CATH W/O FL  07/17/2022   IR REMOVAL TUN CV CATH W/O FL  08/18/2022   IR US GUIDE VASC ACCESS LEFT  07/21/2022   IR US GUIDE VASC ACCESS LEFT  07/21/2022   IR US GUIDE VASC ACCESS RIGHT  10/11/2019   TEE WITHOUT CARDIOVERSION N/A 07/18/2022   Procedure: TRANSESOPHAGEAL ECHOCARDIOGRAM (TEE);  Surgeon: Maisie Fus, MD;  Location: [facility] ENDOSCOPY;  Service: Cardiovascular;  Laterality: N/A;   THORACOTOMY Left 11/08/2013   Procedure: LEFT THORACOTOMY;  Surgeon: Kerin Perna, MD;  Location: [facility] OR;  Service: Thoracic;  Laterality: Left;   Family History  Family History  Problem Relation Age of Onset   Diabetes Mellitus II Mother        died @ 41.    Gastric cancer Mother    CAD Father        died @ 60.   Heart attack Father    Congestive Heart Failure Father    Diabetes Mellitus II Sister    CAD Sister        s/p PCI - age 37.   Social History  reports that he has never smoked. He has never used smokeless tobacco. He reports current drug use. Drug: Marijuana. He reports that he does not drink alcohol. Allergies  Allergies  Allergen Reactions   Acyclovir And Related Hives   Imdur [Isosorbide Nitrate]     Headaches from nitrate   Home medications Prior to Admission medications   Medication Sig Start Date End Date Taking? Authorizing Provider  Accu-Chek Softclix Lancets lancets Use as directed four times daily. 10/15/23     acetaminophen (TYLENOL) 500 MG tablet Take 1,000 mg by mouth every 6 (six) hours as needed for moderate pain.    [provider]  atorvastatin (LIPITOR) 80 MG tablet Take 1 tablet (80 mg total) by mouth daily. 01/06/23   Bensimhon, Bevelyn Buckles, MD  blood glucose meter kit and supplies KIT Use to check blood sugar up to four times daily as directed. 10/15/23   Masters, Katie, DO  carvedilol (COREG) 25 MG tablet Take 2 tablets (50 mg total) by mouth 2 (two) times daily. 10/15/23   Masters, Florentina Addison, DO  ciprofloxacin (CIPRO) 500 MG tablet Take 1 tablet (500 mg total) by mouth 2 (two) times daily for 10 days. 10/21/23 10/31/23  Standiford, Jenelle Mages, DPM  ferric citrate (AURYXIA) 1 GM 210 MG(Fe) tablet Take 2 tablets (420 mg total) by mouth with each meal and 2 tablets (420 mg total) with snacks. 10/14/22     glucose blood (ACCU-CHEK GUIDE TEST) test strip Use as directed four times daily. 10/15/23     hydrALAZINE (APRESOLINE) 50 MG tablet Take 1 tablet (50 mg total) by mouth 3 (three) times daily. 10/15/23   Masters, Katie, DO  insulin aspart (NOVOLOG FLEXPEN) 100 UNIT/ML FlexPen Inject 15 Units into the skin 3 (three) times daily with meals. Patient taking differently: Inject 17 Units into the skin 3 (three) times daily with  meals. 07/07/23     insulin glargine-yfgn (SEMGLEE, YFGN,) 100 UNIT/ML Pen Inject 20 Units into the skin at bedtime. 09/19/22   Masters, Florentina Addison, DO  multivitamin (RENA-VIT) TABS tablet Take 1 tablet by mouth daily.    [provider]  nitroGLYCERIN (NITROSTAT) 0.4 MG SL tablet Place 1 tablet (0.4 mg total) under the tongue every 5 (five) minutes x 3 doses as needed within 15 minutes. If chest pain does not resolve, seek  medical attention. 07/23/23     ondansetron (ZOFRAN) 4 MG tablet Take 1 tablet by mouth once a day as needed Patient taking differently: Take 4 mg by mouth 3 (three) times a week. 07/23/23     ondansetron (ZOFRAN) 4 MG tablet Take 1 tablet by mouth once a day as needed 10/14/23     pantoprazole (PROTONIX) 20 MG tablet Take 1 tablet (20 mg total) by mouth daily as directed for reflux. 07/23/23     RENVELA 800 MG tablet Take 800 mg by mouth 3 (three) times daily with meals. 07/28/21   [provider]  sevelamer carbonate (RENVELA) 800 MG tablet Take 3 tablets (2,400 mg total) by mouth 3 (three) times daily with meals 09/30/23        Vitals:   10/26/23 0330 10/26/23 0338 10/26/23 0342 10/26/23 0403  BP: (!) 164/73     Pulse: (!) 110 (!) 109 (!) 116   Resp: (!) 27 (!) 28 (!) 30 (!) 30  Temp:      TempSrc:      SpO2: 100% 97% 94%   Weight:      Height:       Exam Gen alert, mild ^wob, high flow 10 L [place] O2 No rash, cyanosis or gangrene Sclera anicteric, throat clear  No jvd or bruits Chest bilat crackles at bases RRR no MRG Abd soft ntnd no mass or ascites +bs GU nl male MS no joint effusions or deformity Ext trace bilat pretib edema, no other edema Neuro is alert, Ox 3 , nf    LIJ TDC intact      Renal-related home meds: - coreg 50 bid - hydralazine 50 tid - renvela 2400 ac tid - renavite - others: PPI, insulins, statin, sl ntg    OP HD: [place] M[place] 4h   B500  113.5kg   2K bath  TDC   Heparin 5000 + 4000 midrun   Assessment/ Plan: Acute  hypoxic resp failure - w/ sig pulm edema and volume overload, on 10 L [place] O2. Will plan HD 1st shift this am.  ESRD - on HD MWF. HD today on schedule.  HTN - bp's a bit high, cont home meds and lower vol w/ HD Volume - as above, sig vol overloaded. Get wt's post HD.  Anemia of esrd - Hb 14, no esa needs.  Secondary hyperparathyroidism - get records, cont binders w/ meals DM2       Vinson Moselle  MD [facility] 10/26/2023, 4:41 AM  Recent Labs  Lab 10/26/23 0335  HGB 14.0   Inpatient medications:

## 2023-10-26 NOTE — Consult Note (Addendum)
 Cardiology Consultation   Patient ID: Kerman Pfost MRN: [id number]; DOB: 22-Jul-1981  Admit date: 10/26/2023 Date of Consult: 10/26/2023  PCP:  Manuela Neptune, MD   [facility] Providers Cardiologist:  Marca Ancona, MD   {  Patient Profile:   Jeremy [name] is a 43 y.o. male with a hx of ESRD on HD (M/W/F), chronic HFpEF with remote NICM 2016 with subsequent improvement then recurrently reduced EF (35-40%), normal coronaries (cath 2016 + cor CT 07/2022), infective endocarditis with severe AI 2023, HTN, HLD, GERD, diabetes who is being seen 10/26/2023 for the evaluation of severe aortic regurgitation at the request of Dr. Lafonda Mosses.  History of Present Illness:   Jeremy [name] has past medical history of ESRD on HD (M/W/F), chronic HFpEF (remote EF 20-25% in 2016 with subsequent improvement but now reduced again), HTN, HLD, GERD, diabetes, severe AI. 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 He was seen by CT surgery and ID. CVTS recommended dental extractions prior to surgery. He did not follow up as outpatient with dental, CVTS, and was lost to follow-up with ID as well. CHF note does outline he received 6 weeks of IV gent/vanc with HD.  He presented to the [facility] ED on 10/26/2023 via EMS after waking up with severe dyspnea.   He presented to the ED in respiratory distress, with O2 sats at 72% when on room air. It was determined patient was having acute hypoxemic respiratory failure secondary to flash pulmonary edema. Patient attempted BiPAP but was unable to remain due to somnolence. He was then placed on a non-rebreather at 15 L. Patient was admitted to medicine who then consulted nephrology for management of ESRD with HD.   While in the ED, patient also reported chest pressure on exertion as well as at rest. He reported that he has recently stopped his Lasix, unsure of why, and is possible 20 lbs over his "dry weight".  Primary team  believes that his dry weight is potentially somewhere between 235-255 lbs. CXR showed worsening pulmonary edema consistent with CHF exacerbation.  Nephrology was urgently consulted for dialysis.  Updated echocardiogram was ordered as the last echo was from November 2023.  There is a concern for possible unstable angina.  Patient has a history of GERD symptoms that are concerning for unstable angina.  He reports taking nitroglycerin at home but feels that his symptoms have been worse over the last 2 weeks.  Primary team thought possible that his complaints are related to both his volume status and his GERD, with resolution of volume status will reevaluate if symptoms are improving.  They have restarted and increased the dose of his Protonix.  Patient was dialyzed per nephrology on 10/26/2023, this lowered his blood pressure from 200/90 to 136/61.  He was still taking Coreg 50 mg twice daily and hydralazine 50 mg 3 times daily for his hypertension.  Primary team started IV Lasix 40 mg x 1 dose to check response.  Updated echocardiogram was completed on 10/26/2023.  It showed reduction in LVEF to 35 to 40%, global hypokinesis, normal RV size and function, mild MR, mild to moderate TR, possible vegetations on aortic valve, severe aortic valve regurgitation, normal IVC.  These findings of decreased EF, possible vegetations prompted the cardiology consult at this time.  Past Medical History:  Diagnosis Date   Abscess of left groin    Acute blood loss anemia 11/11/2013   Anemia in chronic kidney disease (CKD)  Asthma    Boil of scrotum 11/21/2015   Chest pain    a. 01/2015 Lexiscan MV: EF 28%, inferior, inferolateral, apical ischemia;  b. 01/2015 Cath: nl cors, PCWP 18 mmHg, CO 9.38 L/min, CI 3.53 L/min/m^2.   CHF (congestive heart failure) (HCC)    Depression    Situational   ESRD (end stage renal disease) (HCC)    [facility]- [place]   Essential hypertension    Family history of adverse reaction to  anesthesia    sister- "it was too much for her heart" died   Flash pulmonary edema (HCC) 09/18/2022   GERD (gastroesophageal reflux disease)    Hyperlipidemia    Membranous glomerulonephritis    Morbid obesity (HCC)    Nonischemic cardiomyopathy (HCC)    a. 01/2015 Echo: EF 20-25%, diff HK, Gr 2 DD, Triv AI, mildly dil LA and Ao root.   PONV (postoperative nausea and vomiting)    Seizures (HCC) 02/29/2020   "electralytes were out of wack."   Type II diabetes mellitus (HCC)    a. 01/2015 HbA1c = 8.9.   Past Surgical History:  Procedure Laterality Date   A/V FISTULAGRAM N/A 08/12/2021   Procedure: A/V FISTULAGRAM;  Surgeon: Maeola Harman, MD;  Location: [facility] INVASIVE CV LAB;  Service: Cardiovascular;  Laterality: N/A;   AV FISTULA PLACEMENT Right 12/18/2020   Procedure: RIGHT ARM RADIOCEPHALIC  ARTERIOVENOUS (AV) FISTULA CREATION;  Surgeon: Maeola Harman, MD;  Location: [facility] OR;  Service: Vascular;  Laterality: Right;   CARDIAC CATHETERIZATION N/A 02/01/2015   Procedure: Right/Left Heart Cath and Coronary Angiography;  Surgeon: Wendall Stade, MD;  Location: [facility] INVASIVE CV LAB;  Service: Cardiovascular;  Laterality: N/A;   INCISION AND DRAINAGE Left 09/05/2023   Procedure: INCISION AND DRAINAGE WITH 3RD TOE METATARSAL RESECTION LEFT FOOT AND 4TH TOE PROXIMAL PHALANX RESECTION;  Surgeon: Pilar Plate, DPM;  Location: [facility] OR;  Service: Orthopedics/Podiatry;  Laterality: Left;  I&D with possible metatarsal resection, left foot   IR FLUORO GUIDE CV LINE LEFT  07/21/2022   IR FLUORO GUIDE CV LINE LEFT  07/21/2022   IR FLUORO GUIDE CV LINE RIGHT  10/11/2019   IR REMOVAL TUN CV CATH W/O FL  07/17/2022   IR REMOVAL TUN CV CATH W/O FL  08/18/2022   IR US GUIDE VASC ACCESS LEFT  07/21/2022   IR US GUIDE VASC ACCESS LEFT  07/21/2022   IR US GUIDE VASC ACCESS RIGHT  10/11/2019   TEE WITHOUT CARDIOVERSION N/A 07/18/2022   Procedure: TRANSESOPHAGEAL ECHOCARDIOGRAM (TEE);   Surgeon: Maisie Fus, MD;  Location: [facility] ENDOSCOPY;  Service: Cardiovascular;  Laterality: N/A;   THORACOTOMY Left 11/08/2013   Procedure: LEFT THORACOTOMY;  Surgeon: Kerin Perna, MD;  Location: [facility] OR;  Service: Thoracic;  Laterality: Left;    Home Medications:  Prior to Admission medications   Medication Sig Start Date End Date Taking? Authorizing Provider  acetaminophen (TYLENOL) 500 MG tablet Take 1,000 mg by mouth every 6 (six) hours as needed for moderate pain.   Yes [provider]  atorvastatin (LIPITOR) 80 MG tablet Take 1 tablet (80 mg total) by mouth daily. 01/06/23  Yes Bensimhon, Bevelyn Buckles, MD  carvedilol (COREG) 25 MG tablet Take 2 tablets (50 mg total) by mouth 2 (two) times daily. 10/15/23  Yes Masters, Katie, DO  ciprofloxacin (CIPRO) 500 MG tablet Take 1 tablet (500 mg total) by mouth 2 (two) times daily for 10 days. 10/21/23 10/31/23 Yes Standiford, Jenelle Mages, DPM  hydrALAZINE (APRESOLINE) 50 MG tablet Take 1 tablet (50 mg total) by mouth 3 (three) times daily. 10/15/23  Yes Masters, Katie, DO  insulin aspart (NOVOLOG FLEXPEN) 100 UNIT/ML FlexPen Inject 15 Units into the skin 3 (three) times daily with meals. Patient taking differently: Inject 17 Units into the skin 3 (three) times daily with meals. 07/07/23  Yes   insulin glargine-yfgn (SEMGLEE, YFGN,) 100 UNIT/ML Pen Inject 20 Units into the skin at bedtime. 09/19/22  Yes Masters, Katie, DO  multivitamin (RENA-VIT) TABS tablet Take 1 tablet by mouth daily.   Yes [provider]  ondansetron (ZOFRAN) 4 MG tablet Take 1 tablet by mouth once a day as needed Patient taking differently: Take 4 mg by mouth 3 (three) times a week. 10/14/23  Yes   pantoprazole (PROTONIX) 20 MG tablet Take 1 tablet (20 mg total) by mouth daily as directed for reflux. 07/23/23  Yes   sevelamer carbonate (RENVELA) 800 MG tablet Take 3 tablets (2,400 mg total) by mouth 3 (three) times daily with meals 09/30/23  Yes   Accu-Chek Softclix Lancets  lancets Use as directed four times daily. 10/15/23     blood glucose meter kit and supplies KIT Use to check blood sugar up to four times daily as directed. 10/15/23   Masters, Katie, DO  glucose blood (ACCU-CHEK GUIDE TEST) test strip Use as directed four times daily. 10/15/23     nitroGLYCERIN (NITROSTAT) 0.4 MG SL tablet Place 1 tablet (0.4 mg total) under the tongue every 5 (five) minutes x 3 doses as needed within 15 minutes. If chest pain does not resolve, seek medical attention. Patient not taking: Reported on 10/26/2023 07/23/23     ondansetron (ZOFRAN) 4 MG tablet Take 1 tablet by mouth once a day as needed Patient not taking: Reported on 10/26/2023 07/23/23      Inpatient Medications: Scheduled Meds:  atorvastatin  80 mg Oral Daily   carvedilol  50 mg Oral BID WC   Chlorhexidine Gluconate Cloth  6 each Topical Q0600   heparin  5,000 Units Subcutaneous Q8H   hydrALAZINE  50 mg Oral TID   insulin aspart  0-15 Units Subcutaneous TID WC   insulin glargine-yfgn  20 Units Subcutaneous QHS   LORazepam  0.5 mg Intravenous Once   pantoprazole  40 mg Oral Daily   sevelamer carbonate  2,400 mg Oral TID WC   Continuous Infusions:  ceFEPime (MAXIPIME) IV Stopped (10/26/23 1707)   [START ON 10/28/2023] vancomycin     vancomycin     PRN Meds: acetaminophen, ondansetron  Allergies:    Allergies  Allergen Reactions   Acyclovir And Related Hives   Imdur [Isosorbide Nitrate]     Headaches from nitrate   Social History:   Social History   Socioeconomic History   Marital status: Single    Spouse name: Not on file   Number of children: Not on file   Years of education: Not on file   Highest education level: Some college, no degree  Occupational History   Not on file  Tobacco Use   Smoking status: Never   Smokeless tobacco: Never  Vaping Use   Vaping status: Never Used  Substance and Sexual Activity   Alcohol use: No    Alcohol/week: 0.0 standard drinks of alcohol   Drug use: Yes     Types: Marijuana    Comment: last used 09/15/2020   Sexual activity: Yes    Partners: Female  Other Topics Concern   Not on  file  Social History Narrative   Lives in [place] by himself.  Currently in [facility].   Caffeine- none       Social Drivers of Corporate investment banker Strain: Not on file  Food Insecurity: No Food Insecurity (09/04/2023)   Hunger Vital Sign    Worried About Running Out of Food in the Last Year: Never true    Ran Out of Food in the Last Year: Never true  Transportation Needs: No Transportation Needs (09/04/2023)   PRAPARE - Administrator, Civil Service (Medical): No    Lack of Transportation (Non-Medical): No  Physical Activity: Insufficiently Active (11/27/2021)   Exercise Vital Sign    Days of Exercise per Week: 2 days    Minutes of Exercise per Session: 30 min  Stress: Stress Concern Present (11/27/2021)   [employer] of Occupational Health - Occupational Stress Questionnaire    Feeling of Stress : To some extent  Social Connections: Unknown (02/17/2023)   Received from [facility]   Social Network    Social Network: Not on file  Intimate Partner Violence: Not At Risk (09/04/2023)   Humiliation, Afraid, Rape, and Kick questionnaire    Fear of Current or Ex-Partner: No    Emotionally Abused: No    Physically Abused: No    Sexually Abused: No    Family History:   Family History  Problem Relation Age of Onset   Diabetes Mellitus II Mother        died @ 74.   Gastric cancer Mother    CAD Father        died @ 6.   Heart attack Father    Congestive Heart Failure Father    Diabetes Mellitus II Sister    CAD Sister        s/p PCI - age 33.    ROS:  Please see the history of present illness.  All other ROS reviewed and negative.     Physical Exam/Data:   Vitals:   10/26/23 1217 10/26/23 1231 10/26/23 1311 10/26/23 1515  BP: (!) 157/57 (!) 154/69 136/61 (!) 153/69  Pulse: (!) 117 (!) 117 (!) 114 99   Resp: 19 (!) 27 (!) 28 16  Temp: 99.6 F (37.6 C)  98.3 F (36.8 C)   TempSrc: Oral  Oral   SpO2: 100% 100% 100% 96%  Weight:      Height:        Intake/Output Summary (Last 24 hours) at 10/26/2023 1716 Last data filed at 10/26/2023 1707 Gross per 24 hour  Intake 100 ml  Output 3500 ml  Net -3400 ml      10/26/2023    8:30 AM 10/26/2023    3:25 AM 10/20/2023    3:36 PM  Last 3 Weights  Weight (lbs) -- 240 lb 255 lb  Weight (kg) -- 108.863 kg 115.667 kg     Body mass index is 28.46 kg/m.  General:  Well nourished, well developed, in no acute distress, currently on room air HEENT: normal Neck: no JVD Vascular: No carotid bruits; Distal pulses 2+ bilaterally Cardiac:  normal S1, S2; RRR;  + systolic / diastolic murmur ( machinery murmur )  Lungs:  clear to auscultation bilaterally, no wheezing, rhonchi or rales  Abd: soft, nontender, no hepatomegaly  Ext: no edema Musculoskeletal:  No deformities, BUE and BLE strength normal and equal Skin: warm and dry  Neuro:  CNs 2-12 intact, no focal abnormalities noted Psych:  Normal affect   EKG:  The EKG was personally reviewed and demonstrates:  sinus tachycardia, HR 108 bpm, LAD  Telemetry:  Telemetry was personally reviewed and demonstrates:  sinus tachycardia, rates 100-120s  Relevant CV Studies: IMPRESSIONS   1. Left ventricular ejection fraction, by estimation, is 35 to 40%. The  left ventricle has moderately decreased function. The left ventricle  demonstrates global hypokinesis. The left ventricular internal cavity size  was severely dilated. Left  ventricular diastolic function could not be evaluated.   2. Right ventricular systolic function is normal. The right ventricular  size is normal. Tricuspid regurgitation signal is inadequate for assessing  PA pressure.   3. The mitral valve is normal in structure. Mild mitral valve  regurgitation. No evidence of mitral stenosis.   4. Tricuspid valve regurgitation is mild to  moderate.   5. The aortic valve is abnormal.      The right and non coronary cusps have focal calcifications that are  concerning for possible vegetations. Aortic valve regurgitation is severe.  Aortic valve sclerosis/calcification is present, without any evidence of  aortic stenosis. Aortic valve mean   gradient measures 5.0 mmHg. Aortic valve Vmax measures 1.46 m/s.   6. The inferior vena cava is normal in size with greater than 50%  respiratory variability, suggesting right atrial pressure of 3 mmHg.   7. In the PSAX view at the level of the AV, there is a jet by colorflow  perpendicular to the aortic valve into the LVOT. Unclear if this is  eccentric AI an eccentric TR in this area or actually a fistula.   8. Recommend TEE for further evaluation of AV leaflets and AR.   Laboratory Data:  High Sensitivity Troponin:   Recent Labs  Lab 10/12/23 0241 10/12/23 0436 10/26/23 0335 10/26/23 0637  TROPONINIHS 45* 44* 54* 95*     Chemistry Recent Labs  Lab 10/26/23 0446 10/26/23 0456 10/26/23 0637  NA 140 140  --   K 4.7 4.6  --   CL 101 106  --   CO2 21*  --   --   GLUCOSE 184* 180*  --   BUN 94* 84*  --   CREATININE 13.28* 13.60* 13.45*  CALCIUM 9.3  --   --   GFRNONAA 4*  --  4*  ANIONGAP 18*  --   --     Recent Labs  Lab 10/26/23 0446  PROT 7.8  ALBUMIN 3.1*  AST 18  ALT 15  ALKPHOS 45  BILITOT 0.7   Lipids No results for input(s): "CHOL", "TRIG", "HDL", "LABVLDL", "LDLCALC", "CHOLHDL" in the last 168 hours.  Hematology Recent Labs  Lab 10/26/23 0335 10/26/23 0456 10/26/23 0649  WBC 15.1*  --  12.8*  RBC 5.10  --  4.78  HGB 14.0 15.0 13.3  HCT 44.8 44.0 42.0  MCV 87.8  --  87.9  MCH 27.5  --  27.8  MCHC 31.3  --  31.7  RDW 21.8*  --  21.3*  PLT 372  --  381   Thyroid No results for input(s): "TSH", "FREET4" in the last 168 hours.  BNP Recent Labs  Lab 10/26/23 0335  BNP 859.3*    DDimer No results for input(s): "DDIMER" in the last 168  hours.  Radiology/Studies:  VAS [place] ABI WITH/WO TBI Result Date: 10/26/2023  LOWER EXTREMITY DOPPLER STUDY Patient Name:  Jeremy [name]  Date of Exam:   10/26/2023 Medical Rec #: [id number]  Accession #:    [id number] Date of Birth: 1980/10/17        Patient Gender: M Patient Age:   59 years Exam Location:  [facility] Procedure:      VAS [place] ABI WITH/WO TBI Referring Phys: Lyn Hollingshead STANDIFORD --------------------------------------------------------------------------------  Indications: Ulceration. High Risk Factors: Hypertension, hyperlipidemia, Diabetes.  Limitations: Today's exam was limited due to Great toe girth, involuntary              patient movement, an open wound and bandages. Comparison Study: No prior studies. Performing Technologist: Olen Cordial RVT  Examination Guidelines: A complete evaluation includes at minimum, Doppler waveform signals and systolic blood pressure reading at the level of bilateral brachial, anterior tibial, and posterior tibial arteries, when vessel segments are accessible. Bilateral testing is considered an integral part of a complete examination. Photoelectric Plethysmograph (PPG) waveforms and toe systolic pressure readings are included as required and additional duplex testing as needed. Limited examinations for reoccurring indications may be performed as noted.  ABI Findings: +--------+------------------+-----+---------+--------+ Right   Rt Pressure (mmHg)IndexWaveform Comment  +--------+------------------+-----+---------+--------+ [id number]                    triphasic         +--------+------------------+-----+---------+--------+ PTA     219               1.34 triphasic         +--------+------------------+-----+---------+--------+ DP      229               1.40 triphasic         +--------+------------------+-----+---------+--------+ +--------+------------------+-----+-----------+-------+ Left    Lt Pressure (mmHg)IndexWaveform    Comment +--------+------------------+-----+-----------+-------+ [id number]                    triphasic          +--------+------------------+-----+-----------+-------+ PTA     242               1.48 multiphasic        +--------+------------------+-----+-----------+-------+ DP      244               1.50 multiphasic        +--------+------------------+-----+-----------+-------+ +-------+-----------+-----------+------------+------------+ ABI/TBIToday's ABIToday's TBIPrevious ABIPrevious TBI +-------+-----------+-----------+------------+------------+ Right  1.4                                            +-------+-----------+-----------+------------+------------+ Left   1.5                                            +-------+-----------+-----------+------------+------------+  Summary: Right: Resting right ankle-brachial index indicates noncompressible right lower extremity arteries. Unable to obtain TBI due to great toe girth. Left: Resting left ankle-brachial index indicates noncompressible left lower extremity arteries. Unable to obtain TBI due to great toe girth. *See table(s) above for measurements and observations.  Electronically signed by Sherald Hess MD on 10/26/2023 at 4:47:59 PM.    Final    ECHOCARDIOGRAM COMPLETE Result Date: 10/26/2023    ECHOCARDIOGRAM REPORT   Patient Name:   Jeremy [name] Date of Exam: 10/26/2023 Medical Rec #:  [id number]       Height:       77.0 in Accession #:  [id number]      Weight:       240.0 lb Date of Birth:  May 04, 1981       BSA:          2.417 m Patient Age:    42 years        BP:           1331/61 mmHg Patient Gender: M               HR:           110 bpm. Exam Location:  Inpatient Procedure: 2D Echo, Cardiac Doppler and Color Doppler (Both Spectral and Color            Flow Doppler were utilized during procedure). Indications:    Dyspnea  History:        Patient has prior history of Echocardiogram examinations, most                  recent 07/18/2022. CHF and Cardiomyopathy; Risk                 Factors:Diabetes.  Sonographer:    Darlys Gales Referring Phys: [id number] JULIE MACHEN IMPRESSIONS  1. Left ventricular ejection fraction, by estimation, is 35 to 40%. The left ventricle has moderately decreased function. The left ventricle demonstrates global hypokinesis. The left ventricular internal cavity size was severely dilated. Left ventricular diastolic function could not be evaluated.  2. Right ventricular systolic function is normal. The right ventricular size is normal. Tricuspid regurgitation signal is inadequate for assessing PA pressure.  3. The mitral valve is normal in structure. Mild mitral valve regurgitation. No evidence of mitral stenosis.  4. Tricuspid valve regurgitation is mild to moderate.  5. The aortic valve is abnormal.     The right and non coronary cusps have focal calcifications that are concerning for possible vegetations. Aortic valve regurgitation is severe. Aortic valve sclerosis/calcification is present, without any evidence of aortic stenosis. Aortic valve mean  gradient measures 5.0 mmHg. Aortic valve Vmax measures 1.46 m/s.  6. The inferior vena cava is normal in size with greater than 50% respiratory variability, suggesting right atrial pressure of 3 mmHg.  7. In the PSAX view at the level of the AV, there is a jet by colorflow perpendicular to the aortic valve into the LVOT. Unclear if this is eccentric AI an eccentric TR in this area or actually a fistula.  8. Recommend TEE for further evaluation of AV leaflets and AR. FINDINGS  Left Ventricle: Left ventricular ejection fraction, by estimation, is 35 to 40%. The left ventricle has moderately decreased function. The left ventricle demonstrates global hypokinesis. Strain imaging was not performed. The left ventricular internal cavity size was severely dilated. There is no left ventricular hypertrophy. Left ventricular diastolic function could not be  evaluated. Right Ventricle: The right ventricular size is normal. No increase in right ventricular wall thickness. Right ventricular systolic function is normal. Tricuspid regurgitation signal is inadequate for assessing PA pressure. Left Atrium: Left atrial size was normal in size. Right Atrium: Right atrial size was normal in size. Pericardium: There is no evidence of pericardial effusion. Mitral Valve: The mitral valve is normal in structure. Mild mitral valve regurgitation. No evidence of mitral valve stenosis. Tricuspid Valve: The tricuspid valve is normal in structure. Tricuspid valve regurgitation is mild to moderate. No evidence of tricuspid stenosis. Aortic Valve: The right and non coronary cusps have focal calcifications. Cannot rule out vegetations. The aortic valve is abnormal.  Aortic valve regurgitation is severe. Aortic regurgitation PHT measures 176 msec. Aortic valve sclerosis/calcification is  present, without any evidence of aortic stenosis. Aortic valve mean gradient measures 5.0 mmHg. Aortic valve peak gradient measures 8.5 mmHg. Pulmonic Valve: The pulmonic valve was normal in structure. Pulmonic valve regurgitation is not visualized. No evidence of pulmonic stenosis. Aorta: The aortic root is normal in size and structure. Venous: The inferior vena cava is normal in size with greater than 50% respiratory variability, suggesting right atrial pressure of 3 mmHg. IAS/Shunts: No atrial level shunt detected by color flow Doppler. Additional Comments: 3D imaging was not performed.  LEFT VENTRICLE PLAX 2D LVIDd:         7.00 cm Diastology LVIDs:         6.00 cm LV e' lateral:   7.72 cm/s LV PW:         1.20 cm LV E/e' lateral: 14.8 LV IVS:        1.10 cm  RIGHT VENTRICLE RV S prime:     10.00 cm/s TAPSE (M-mode): 1.8 cm LEFT ATRIUM              Index        RIGHT ATRIUM          Index LA Vol (A2C):   122.0 ml 50.48 ml/m  RA Area:     8.91 cm LA Vol (A4C):   63.3 ml  26.19 ml/m  RA Volume:   13.70 ml  5.67 ml/m LA Biplane Vol: 91.0 ml  37.65 ml/m  AORTIC VALVE AV Vmax:           146.00 cm/s AV Vmean:          107.000 cm/s AV VTI:            0.236 m AV Peak Grad:      8.5 mmHg AV Mean Grad:      5.0 mmHg LVOT Vmax:         133.00 cm/s LVOT Vmean:        99.800 cm/s LVOT VTI:          0.228 m LVOT/AV VTI ratio: 0.97 AI PHT:            176 msec MITRAL VALVE MV Area (PHT): 5.42 cm     SHUNTS MV Decel Time: 140 msec     Systemic VTI: 0.23 m MV E velocity: 114.00 cm/s Armanda Magic MD Electronically signed by Armanda Magic MD Signature Date/Time: 10/26/2023/2:12:58 PM    Final    DG Chest Portable 1 View Result Date: 10/26/2023 CLINICAL DATA:  Shortness of breath EXAM: PORTABLE CHEST 1 VIEW COMPARISON:  10/12/2023 FINDINGS: Cardiac shadow is enlarged but stable. Dialysis catheter is again seen. Increasing vascular congestion is noted consistent with fluid overload. Some parenchymal opacity is noted in the bases which may represent edema. IMPRESSION: Changes of CHF likely related to volume overload. Electronically Signed   By: Alcide Clever M.D.   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 LVID increased to 7 cm (previously 5.6 cm)  Prior echo from 07/2022 showed: EF 50-55%, mild LVH, trivial MR, grade II diastolic dysfunction, moderate  AR, a possible small vegetation on aortic valve -- TEE was recommended at that time. TEE then showed SVC thrombus, no MR, small vegetation on non-coronary cusp of aortic valve, concern for borderline severe AR  He was discharged from the hospital shortly after with plans to get IV vancomycin and gentamicin to be administered  during hemodialysis days. He was supposed to follow up with CT surgery for aortic valve replacement after dental surgery but failed to do so.  He was last seen in advanced heart failure clinic on 09/26/2022 where they discussed that he was suppoesd to be cleared by dental surgery before considering AVR. Dental surgeon recommended the patient remove at least 8 teeth. CTA showed no evidence of CAD, calcium score of 0, normal cors with right dominance. He was supposed to follow up with CVTS after extraction of his teeth. It was believed that he would require at least 2 weeks of IV antibiotics before undergoing AVR. He has yet to return to dental surgery to have teeth extracted.    Patient is feeling better overall in the room upon examination. He is currently 97% SpO2 on room air, denies any chest pain or shortness of breath. He states that he feels much better after attending dialysis. He denies any missed doses of medications/sessions of dialysis prior to admission.   Patient previously had echo showing EF 20-25% in 01/2015. Underwent RHC/LHC at that time which showed no significant CAD, nonischemic DCM. Suspect recurrent drop in EF is also nonischemic related to AR.   Treated for E faecalis bacteremia (07/2022) secondary to infected IJ HD catheter. Patient was treated with IV antibiotics x 6 weeks at dialysis  Patient was originally scheduled to get TEE tomorrow, will likely cancel this as patient has not gotten teeth pulled per dental surgery and would not likely be able to proceed with AVR, will discuss with MD Not necessary to consult CT surgery at this time, as patient has not proceeded with necessary dental workup prior to undergoing AVR  Diuresis/volume management being driven by nephrology  Currently getting dialysis M/W/F Has received IV Lasix 40 mg x 1 dose   Hypertension Most recent BP 153/69, this was after missing some medications while in dialysis Seems to improve after dialysis   Currently on carvedilol 50 mg BID, hydralazine 50 mg TID  Hyperlipidemia  Lipid panel from 09/2022 showed LDL 66 Ordered updated lipid panel/LPA for AM Currently on Lipitor 80 mg daily   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  Agree with assessment and plan as noted above.  Changes made to the above note as needed.  Patient seen and independently examined with  Evlyn Clines , PA .   We discussed all aspects of the encounter. I agree with the assessment and plan as stated above.    Aortic regurgitation:   he has had severe aortic insufficiency for the past year or so.   He has seen the CHF clinic.  He has not had his dental extractions,  he missed his ID appt.  He continues to have episodes of volume overload  over the weekend.   He admits to eating salty processed meats ( processed sandwich meat)   he admits to drinking more that he is supposed to on the weekends.   The echo from this admission shows marked LV dilatation compared to his previous echo in Nov. 2023.    This is due to his persistent severe aortic insufficiency .  At present  he is breathing comfortably on room air at present   He was seen by Dr. Maren Beach on Nov. 10, 2023 ( progress note)  Dr. Maren Beach  recommended that he get multiple dental extractions  He now needs to add treatment / amutation of a diabetic toe infection to his list .   He needs to have his dental extractions  He needs to have his infected toe amputated / treated  He needs to follow up with Dr. Shirlee Latch in the Advanced CHF clinic       I have spent a total of  50 minutes with patient reviewing hospital  notes , telemetry, EKGs, labs and examining patient as well as establishing an assessment and plan that was discussed with the patient.  > 50% of time was spent in direct patient care.     Vesta Mixer, Montez Hageman., MD, [facility] 10/26/2023, 5:28 PM [address]  [address] Office [phone] Pager [phone]

## 2023-10-26 NOTE — Progress Notes (Signed)
 Pharmacy Antibiotic Note  Jeremy [name] is a 43 y.o. male admitted on 10/26/2023 with  osteomyelitis .  Pharmacy has been consulted for cefepime / vanco dosing.  Plan: Vanco 2000 mg iv x1 as load dose f/b 1000 mg iv q s/p HD (MWF) Cefepime 1 gram iv qday after HD on HD days  Height: 6\' 5"  (195.6 cm) Weight:  (patient in ER stretcher and too lethargic and weak for standing weight.) IBW/kg (Calculated) : 89.1  Temp (24hrs), Avg:98.6 F (37 C), Min:97.7 F (36.5 C), Max:99.6 F (37.6 C)  Recent Labs  Lab 10/26/23 0335 10/26/23 0446 10/26/23 0456 10/26/23 0637 10/26/23 0649  WBC 15.1*  --   --   --  12.8*  CREATININE  --  13.28* 13.60* 13.45*  --   LATICACIDVEN  --   --   --   --  2.1*    Estimated Creatinine Clearance: 9.7 mL/min (A) (by C-G formula based on SCr of 13.45 mg/dL (H)).    Allergies  Allergen Reactions   Acyclovir And Related Hives   Imdur [Isosorbide Nitrate]     Headaches from nitrate    Antimicrobials this admission: Vanco 2/17 >>  Cefepime 2/17 >>   Dose adjustments this admission: Cefepime and vanco adjusted for iHD  Microbiology results: 2/17 BCx: ip   Thank you for allowing pharmacy to be a part of this patient's care.   Greta Doom BS, PharmD, BCPS Clinical Pharmacist 10/26/2023 2:33 PM  Contact: [phone] after 3 PM  "Be curious, not judgmental..." -Debbora Dus

## 2023-10-26 NOTE — H&P (Addendum)
 Date: 10/26/2023               Patient Name:  Jeremy [name] MRN: [id number]  DOB: [birth year] Age / Sex: 43 y.o., male   PCP: Manuela Neptune, MD         Medical Service: Internal Medicine Teaching Service         Attending Physician: Dr. Mercie Eon, MD      First Contact: Dr. Morrie Sheldon, MD Pager [phone]    Second Contact: Dr. Rudene Christians, DO Pager [phone]         After Hours (After 5p/  First Contact Pager: [phone]  weekends / holidays): Second Contact Pager: [phone]   SUBJECTIVE   Chief Complaint: Shortness of breath  History of Present Illness:   This is a 43 year old male with a past medical history of ESRD on dialysis Monday, Wednesday, Friday, type 2 diabetes mellitus, HFpEF, history of diabetic foot infection who presents to the emergency department concerns of shortness of breath.    He has been having shortness of breath for the past two weeks.  Has progressively gotten worse.  He also reports having sleep upright in recliner.  He states he is now coughing up pink-tinged sputum.  He states he has not missed dialysis.  However, his Lasix was stopped, and feels that his fluid is coming back.  He reports being 20 pounds over his dry weight.  He also endorses having some chest like pressure that is worse with exertion, and is improved with rest over the past few weeks.  His sister at bedside states that it is not improved at rest.  He reports that he came to the emergency department earlier this month and was diagnosed with the flu and has not improved since.  He denies any fevers or chills.  He does endorse weakness and fatigue.  Of note, he was recently admitted to our service on 12/26-12/30 for concerns of osteomyelitis.  He had a partial amputation of the fourth toe of the left foot at his previous hospitalization.  He was discharged with Augmentin.  Since then, he has been followed by his podiatrist who is concerned about worsening dehiscence of his  surgical site with ongoing osteomyelitis.  He was given ciprofloxacin for this and recommended to come to the emergency department on 10/20/1023, and he did not.  He now presents and has no toe pain, and his foot is wrapped.  No obvious drainage noted.  ED Course:  Patient initially presented to the emergency department with respiratory distress.  He is hypoxic at 72% on room air.  He was started on BiPAP.  Due to his somnolence he was not able to remain on BiPAP.  Nephrology was urgently consulted for dialysis and they will take him to dialysis early this morning.  Given concern for osteomyelitis and acute hypoxemic respiratory failure, IMTS was consulted for further evaluation and management.  Past Medical History Past Medical History:  Diagnosis Date   Abscess of left groin    Acute blood loss anemia 11/11/2013   Anemia in chronic kidney disease (CKD)    Asthma    Boil of scrotum 11/21/2015   Chest pain    a. 01/2015 Lexiscan MV: EF 28%, inferior, inferolateral, apical ischemia;  b. 01/2015 Cath: nl cors, PCWP 18 mmHg, CO 9.38 L/min, CI 3.53 L/min/m^2.   CHF (congestive heart failure) (HCC)    Depression    Situational   ESRD (end stage renal disease) (HCC)    [facility]S- [place]  [place]   Essential hypertension    Family history of adverse reaction to anesthesia    sister- "it was too much for her heart" died   Flash pulmonary edema (HCC) 09/18/2022   GERD (gastroesophageal reflux disease)    Hyperlipidemia    Membranous glomerulonephritis    Morbid obesity (HCC)    Nonischemic cardiomyopathy (HCC)    a. 01/2015 Echo: EF 20-25%, diff HK, Gr 2 DD, Triv AI, mildly dil LA and Ao root.   PONV (postoperative nausea and vomiting)    Seizures (HCC) 02/29/2020   "electralytes were out of wack."   Type II diabetes mellitus (HCC)    a. 01/2015 HbA1c = 8.9.     Meds:  Tylenol 1000 mg every 6 hours Atorvastatin 80 mg daily Ferric citrate 420 mg with each meal, and 4 and 20 mg with  snacks Coreg 50 mg twice daily Hydralazine 50 mg 3 times daily Semglee 20 units nightly Multivitamin daily Nitroglycerin tablet as needed NovoLog 15 units with meals 3 times daily Zofran 4 mg daily as needed Protonix 20 mg daily Renvela 800 mg 3 times daily Renavite  Doxycycline 100 BID  Ciprofloxacin 500 BID  Clonadine- paused  Amlodipine- paused    Past Surgical History  Past Surgical History:  Procedure Laterality Date   A/V FISTULAGRAM N/A 08/12/2021   Procedure: A/V FISTULAGRAM;  Surgeon: Maeola Harman, MD;  Location: [facility] INVASIVE CV LAB;  Service: Cardiovascular;  Laterality: N/A;   AV FISTULA PLACEMENT Right 12/18/2020   Procedure: RIGHT ARM RADIOCEPHALIC  ARTERIOVENOUS (AV) FISTULA CREATION;  Surgeon: Maeola Harman, MD;  Location[facility] OR;  Service: Vascular;  Laterality: Right;   CARDIAC CATHETERIZATION N/A 02/01/2015   Procedure: Right/Left Heart Cath and Coronary Angiography;  Surgeon: Wendall Stade, MD;  [facility] INVAS[facility] CV LAB;  Service: Cardiovascular;  Laterality: N/A;   INCISION AND DRAINAGE Left 09/05/2023   Procedure: INCISION AND DRAINAGE WITH 3RD TOE METATARSAL RESECTION LEFT FOOT AND 4TH TOE PROXIMAL PHALANX RESECTION;  Surgeon: Pilar Plate, DPM;  Location: [facility] OR;  Service: Orthopedics/Podiatry;  Laterality: Left;  I&D with possible metatarsal resection, left foot   IR FLUORO GUIDE CV LINE LEFT  07/21/2022   IR FLUORO GUIDE CV LINE LEFT  07/21/2022   IR FLUORO GUIDE CV LINE RIGHT  10/11/2019   IR REMOVAL TUN CV CATH W/O FL  07/17/2022   IR REMOVAL TUN CV CATH W/O FL  08/18/2022   IR US GUIDE VASC ACCESS LEFT  07/21/2022   IR US GUIDE VASC ACCESS LEFT  07/21/2022   IR US GUIDE VASC ACCESS RIGHT  10/11/2019   TEE WITHOUT CARDIOVERSION N/A 07/18/2022   Procedure: TRANSESOPHAGEAL ECHOCARDIOGRAM (TEE);  Surgeon: Maisie Fus, MD;  [facility] ENDOSCOPY;  Service: Cardiovascular;  Laterality: N/A;   THORACOTOMY Left 11/08/2013    Procedure: LEFT THORACOTOMY;  Surgeon: Kerin Perna, MD;  Location[facility] OR;  Service: Thoracic;  Laterality: Left;    Social:  Lives With: Girlfriend, [place] Occupation: Landscaping business  Support: Good support in sister and girlfriend  Level of Function: Independent in all ADLs and iADLs PCP: Dr. Hassan Rowan  Substances: No substance use  Family History: No family history reported   Allergies: Allergies as of 10/26/2023 - Review Complete 10/26/2023  Allergen Reaction Noted   Acyclovir and related Hives 11/28/2016   Imdur [isosorbide nitrate]  07/21/2022    Review of Systems: A complete ROS was negative except as per HPI.   OBJECTIVE:  Physical Exam: Blood pressure (!) 174/76, pulse (!) 106, temperature 97.7 F (36.5 C), temperature source Oral, resp. rate (!) 26, height 6\' 5"  (1.956 m), weight 108.9 kg, SpO2 97%.  Constitutional: ill-appearing man sitting in bed, in no acute distress HENT: normocephalic atraumatic, mucous membranes moist Eyes: conjunctiva non-erythematous Neck: supple Cardiovascular: regular rate and rhythm, no m/r/g Pulmonary/Chest: normal work of breathing on room air, lungs rales bilaterally Abdominal: soft, non-tender, non-distended MSK: normal bulk and tone. 4th toe amputation on R. L foot in clean bandaged wrapping. Neurological: alert & oriented x 3 Skin: warm and dry  Labs: CBC    Component Value Date/Time   WBC 15.1 (H) 10/26/2023 0335   RBC 5.10 10/26/2023 0335   HGB 15.0 10/26/2023 0456   HCT 44.0 10/26/2023 0456   HCT 23.7 (L) 05/30/2016 1157   PLT 372 10/26/2023 0335   MCV 87.8 10/26/2023 0335   MCH 27.5 10/26/2023 0335   MCHC 31.3 10/26/2023 0335   RDW 21.8 (H) 10/26/2023 0335   LYMPHSABS 1.0 10/26/2023 0335   MONOABS 0.6 10/26/2023 0335   EOSABS 0.1 10/26/2023 0335   BASOSABS 0.1 10/26/2023 0335     CMP     Component Value Date/Time   NA 140 10/26/2023 0456   NA 141 10/15/2020 1015   K 4.6 10/26/2023 0456    CL 106 10/26/2023 0456   CO2 21 (L) 10/26/2023 0446   GLUCOSE 180 (H) 10/26/2023 0456   BUN 84 (H) 10/26/2023 0456   BUN 62 (H) 10/15/2020 1015   CREATININE 13.60 (H) 10/26/2023 0456   CALCIUM 9.3 10/26/2023 0446   PROT 7.8 10/26/2023 0446   ALBUMIN 3.1 (L) 10/26/2023 0446   AST 18 10/26/2023 0446   ALT 15 10/26/2023 0446   ALKPHOS 45 10/26/2023 0446   BILITOT 0.7 10/26/2023 0446   GFRNONAA 4 (L) 10/26/2023 0446   GFRAA 9 (L) 10/15/2020 1015    Imaging:  Chest x-ray: Showing changes of CHF likely related to volume overload  EKG: personally reviewed my interpretation is sinus tachycardia.  No acute ST segment changes.  When compared to previous ECG, rate is much faster.  ASSESSMENT & PLAN:   Assessment & Plan by Problem: Active Problems:   ESRD on dialysis (HCC)   HF with recovered EF   Diabetic foot infection (HCC)   Flash pulmonary edema (HCC)   Dehiscence of external surgical wound   Ladarrious Kirksey is a 43 y.o. male with a past medical history of heart failure, ESRD on dialysis, type 2 diabetes mellitus, history of osteomyelitis who presents for concerns of shortness of breath.  Patient mated for further evaluation management of acute hypoxemic respiratory failure secondary to flash pulmonary edema.  Acute hypoxemic respiratory failure Flash pulmonary edema HFpEF exacerbation Patient presents for acute shortness of breath.  It has progressively become worse.  He is also reports having chest like pressure on exertion as well as with rest.  He has recently been stopped on his Lasix, but unclear why.  He reports he is 20 pounds above his dry weight.  Chest x-ray showing concerns for worsening pulmonary edema consistent with CHF exacerbation.  Given concern for significant volume overload, nephrology was consulted for urgent dialysis.  Will follow-up with nephrology. -Will trend troponins -Nephrology to dialyze -Can start Lasix afterwards as he does make urine -Will  obtain echocardiogram as most recent echo was in November 2023 -As needed BiPAP if patient somnolence improves -SpO2 goal greater than 92%, can give supplemental oxygen to  reach goal  Osteomyelitis of third metatarsal of left foot Wound dehiscence of previous surgical site of fourth phalanx of left toe Patient recently had partial fourth phalanx amputation of left foot.  He is followed by podiatry outpatient.  He was seen on 10/20/2023 and was recommended to go to the emergency department as patient has developed third metatarsal osteomyelitis of that left foot.  Given patient is hemodynamically stable at this time, will hold off on antibiotics at this time to ensure good culture data. -Obtain blood cultures -Obtain MRI of the left foot -Hold antibiotics for now -Consult podiatry  Concern for unstable angina He has a history of GERD but symptoms are concerning for unstable angina - pressure-like with and without activity. He takes nitroglycerin at home but feels that his symptoms are worse over the last two weeks. Possible that his complaint is related to his volume status and GERD, and so will monitor as these resolved (did increase his protonix, see below). -Consider cardiology consult if patient continues to have unstable angina after resolution of confounding problems   ESRD on dialysis MWF Patient has not missed any dialysis.  On labs today, he is becoming uremic.  Will need dialysis.  Nephrology following. -Dialysis per nephrology -Resume home Renvela  Type 2 diabetes mellitus Patient has a past medical history of type 2 diabetes mellitus.  His most recent A1c was 6.8 about 2 months ago.  This is well-controlled at this time.  However he is on dialysis, and could be miss represented.  Given acute infection, I am concerned that maybe he could be developing early DKA in the setting of his anion gap metabolic acidosis. -Start sliding scale insulin -Can resume home Lantus 20 units  daily -Start sliding scale insulin -Will obtain BHBA -Will also obtain urinalysis to look for ketones  Anion gap metabolic acidosis Patient does have anion gap.  This is likely from uremia.  Given he does have an infection, would like to check lactic acid.  Will also check if patient has ketones concerning for early DKA. -Follow-up lactic acid -Follow-up beta hydroxybutyrate acid -Follow-up CMP -Follow-up VBG  GERD Patient has a past medical history of GERD.  He does report having some heartburn concerns.  He is on Protonix 20 mg daily.  Will increase to 40 mg daily. -Increase Protonix to 40 mg daily  Diet: Carb-Modified VTE: Heparin IVF: None,None Code: Full  Prior to Admission Living Arrangement: Home, living with girlfriend Anticipated Discharge Location: Home Barriers to Discharge: Clinical improvement and definitive plan for osteomyelitis  Dispo: Admit patient to Inpatient with expected length of stay greater than 2 midnights.  Signed: Katheran James, DO Internal Medicine Resident PGY-1 Pager: [phone] 10/26/2023, 6:51 AM   On weekends or after 5pm please page on call intern or resident: First contact: [phone] If no answer in 15 minutes, please contact senior pager at [phone]

## 2023-10-26 NOTE — Consult Note (Addendum)
 PODIATRY CONSULTATION  NAME Jeremy [name] MRN [id number] DOB [birth year] DOA 10/26/2023   Reason for consult: Left foot wound  Attending/Consulting physician: Lowry Bowl MD  History of present illness: "43 y.o. male with ESRD on MWF HD, HFpEF, T2DM, and diabetic left foot infection who presents with acute progressive shortness of breath, associated with productive cough, orthopnea, and chest pain with coughing. He is not having any chest pain except when he coughs."   Patient recently seen in office last week.  There is noted to be concern for infection and residual osteomyelitis at the prior surgical site which included partial third ray amputation and resection base of fourth proximal phalanx.  Unfortunately the patient did not follow-up as instructed for extended period of time following the initial postop appointment.  When he did come back and there was concern for some malodor and dehiscence of the surgical site.  I recommended the patient the going to the emergency department last week however he did not want to stating he had some stuff to do at home.  Was plan to direct admit the patient today.  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 01/2015 Lexiscan MV: EF 28%, inferior, inferolateral, apical ischemia;  b. 01/2015 Cath: nl cors, PCWP 18 mmHg, CO 9.38 L/min, CI 3.53 L/min/m^2.   CHF (congestive heart failure) (HCC)    Depression    Situational   ESRD (end stage renal disease) (HCC)    [facility]- [place]   Essential hypertension    Family history of adverse reaction to anesthesia    sister- "it was too much for her heart" died   Flash pulmonary edema (HCC) 09/18/2022   GERD (gastroesophageal reflux disease)    Hyperlipidemia    Membranous glomerulonephritis    Morbid obesity (HCC)    Nonischemic cardiomyopathy  (HCC)    a. 01/2015 Echo: EF 20-25%, diff HK, Gr 2 DD, Triv AI, mildly dil LA and Ao root.   PONV (postoperative nausea and vomiting)    Seizures (HCC) 02/29/2020   "electralytes were out of wack."   Type II diabetes mellitus (HCC)    a. 01/2015 HbA1c = 8.9.       Latest Ref Rng & Units 10/26/2023    6:49 AM 10/26/2023    4:56 AM 10/26/2023    3:35 AM  CBC  WBC 4.0 - 10.5 K/uL 12.8   15.1   Hemoglobin 13.0 - 17.0 g/dL 40.9  81.1  91.4   Hematocrit 39.0 - 52.0 % 42.0  44.0  44.8   Platelets 150 - 400 K/uL 381   372        Latest Ref Rng & Units 10/26/2023    6:37 AM 10/26/2023    4:56 AM 10/26/2023    4:46 AM  BMP  Glucose 70 - 99 mg/dL  782  956   BUN 6 - 20 mg/dL  84  94   Creatinine 2.13 - 1.24 mg/dL 08.65  78.46  96.29   Sodium 135 - 145 mmol/L  140  140   Potassium 3.5 - 5.1 mmol/L  4.6  4.7   Chloride 98 - 111 mmol/L  106  101   CO2 22 - 32 mmol/L   21   Calcium 8.9 - 10.3 mg/dL  9.3       Physical Exam: Lower Extremity Exam Dressing clean dry and intact to left foot With DP and PT pulses edema noted of the left foot At last visit on the 11th there was concern for dehiscence and malodor of the surgical site though no active drainage or purulence was seen.  Radiographs: 10/20/2023 XR 3 views AP lateral bleak of the left foot weightbearing.  Findings: Possible erosions concerning for osteomyelitis at the resected margin of the third metatarsal.   Pathology: Skin with ulceration and associated necrotizing inflammation.  Bone at the resection margin does not show osteomyelitis.  Fourth toe proximal phalanx no evidence of acute osteomyelitis   Micro: Proteus mirabilis rare bacteriodes Caccae    ASSESSMENT/PLAN OF CARE 43 y.o. male with PMHx significant for ESRD and on HD, CHF, DM2 with neuropathy with dehiscence of prior surgical site including partial third ray amputation with concern for residual osteomyelitis.  MRI left foot, pending WBC 12.8 (15.1) Blood cultures  pending  -Recommend MRI of the left foot to evaluate for further osteomyelitis and abscess at the surgical site.  Surgical plans pending these results.  Plan for surgery tomorrow versus Wednesday -Ordered ABI PVR testing to rule out  component of PAD - Continue IV abx broad spectrum pending further culture data - Anticoagulation: Hold pending the OR - Wound care: Leave dressing clean dry and intact until surgery - WB status: Weightbearing left foot preoperatively - Will continue to follow   Thank you for the consult.  Please contact me directly with any questions or concerns.           Corinna Gab, DPM [facility]    [address] [place], [place] [address]                Office [phone]  Fax [phone]

## 2023-10-26 NOTE — Progress Notes (Signed)
 RE: troponin  Discussed elevated troponin with RN Willa Rough and Dr. Elease Hashimoto. Likely in the setting of demand ischemia due to severe aortic insuffiency. Prior LHC with minimal coronary artery disease. Patient not currently endorsing chest pain.  - Night team to follow troponin to peak

## 2023-10-26 NOTE — Progress Notes (Signed)
 PHARMACY NOTE:  ANTIMICROBIAL RENAL DOSAGE ADJUSTMENT  Current antimicrobial regimen includes a mismatch between antimicrobial dosage and estimated renal function.  As per policy approved by the Pharmacy & Therapeutics and Medical Executive Committees, the antimicrobial dosage will be adjusted accordingly.  Current antimicrobial dosage:  Cipro 500mg  PO BID  Indication: foot infection  Renal Function:  Estimated Creatinine Clearance: 9.6 mL/min (A) (by C-G formula based on SCr of 13.6 mg/dL (H)). [x]      On intermittent HD, scheduled: MWF []      On CRRT    Antimicrobial dosage has been changed to:  Cipro 500mg  PO daily    Thank you for allowing pharmacy to be a part of this patient's care.  Vernard Gambles, PharmD, BCPS  10/26/2023 6:52 AM

## 2023-10-26 NOTE — ED Notes (Signed)
 MD paged x2 to notify of troponin result and to ask for pain medication order. RN awaiting return phone call.

## 2023-10-26 NOTE — Progress Notes (Signed)
 Received patient in bed to unit.  Alert and oriented.  Informed consent signed and in chart.   TX duration: 3 hours  Patient tolerated well.  Transported back to the room  Alert, without acute distress.  Hand-off given to patient's nurse.   Access used: Left HD Cath internal jugular  Access issues: High arterial pressures, had to put BFR at 300 and 250 in the last 20 minutes  Total UF removed: 3.5L Medication(s) given: none   10/26/23 1217  Vitals  Temp 99.6 F (37.6 C)  Temp Source Oral  BP (!) 157/57  MAP (mmHg) 86  BP Location Right Wrist  BP Method Automatic  Patient Position (if appropriate) Lying  Pulse Rate (!) 117  Resp 19  Oxygen Therapy  SpO2 100 %  O2 Device Nasal Cannula  O2 Flow Rate (L/min) 10 L/min  During Treatment Monitoring  Duration of HD Treatment -hour(s) 3 hour(s)  HD Safety Checks Performed Yes  Intra-Hemodialysis Comments Tx completed  Dialysis Fluid Bolus Normal Saline  Bolus Amount (mL) 300 mL  Post Treatment  Dialyzer Clearance Clear  Liters Processed 54.8  Fluid Removed (mL) 3500 mL  Tolerated HD Treatment Yes  Hemodialysis Catheter Left Internal jugular Double lumen Permanent (Tunneled)  Placement Date/Time: 07/21/22 1404   Serial / Lot #: [id number]  Expiration Date: 12/19/26  Time Out: Correct patient;Correct site;Correct procedure  Maximum sterile barrier precautions: Hand hygiene;Cap;Mask;Sterile gown;Sterile gloves;Large sterile ...  Site Condition No complications  Blue Lumen Status Flushed;Dead end cap in place;Heparin locked  Red Lumen Status Flushed;Heparin locked;Dead end cap in place  Purple Lumen Status N/A  Catheter fill solution Heparin 1000 units/ml  Catheter fill volume (Arterial) 2.3 cc  Catheter fill volume (Venous) 2.3  Dressing Type Transparent  Dressing Status Antimicrobial disc/dressing in place;Clean, Dry, Intact  Interventions Other (Comment) (deaccessed)  Drainage Description None  Dressing Change Due  11/02/23  Post treatment catheter status Capped and Clamped     Stacie Glaze LPN [facility]

## 2023-10-27 ENCOUNTER — Inpatient Hospital Stay (HOSPITAL_COMMUNITY): Payer: 59

## 2023-10-27 ENCOUNTER — Encounter (HOSPITAL_COMMUNITY)
Admission: EM | Disposition: A | Discharge status: Home / Self Care | Payer: Self-pay | Source: Home / Self Care | Attending: Internal Medicine

## 2023-10-27 DIAGNOSIS — I5043 Acute on chronic combined systolic (congestive) and diastolic (congestive) heart failure: Secondary | ICD-10-CM

## 2023-10-27 DIAGNOSIS — E1122 Type 2 diabetes mellitus with diabetic chronic kidney disease: Secondary | ICD-10-CM | POA: Diagnosis not present

## 2023-10-27 DIAGNOSIS — I351 Nonrheumatic aortic (valve) insufficiency: Secondary | ICD-10-CM | POA: Diagnosis not present

## 2023-10-27 DIAGNOSIS — N186 End stage renal disease: Secondary | ICD-10-CM | POA: Diagnosis not present

## 2023-10-27 DIAGNOSIS — E11628 Type 2 diabetes mellitus with other skin complications: Secondary | ICD-10-CM

## 2023-10-27 DIAGNOSIS — J9601 Acute respiratory failure with hypoxia: Secondary | ICD-10-CM | POA: Diagnosis not present

## 2023-10-27 DIAGNOSIS — M86172 Other acute osteomyelitis, left ankle and foot: Secondary | ICD-10-CM | POA: Diagnosis not present

## 2023-10-27 DIAGNOSIS — L089 Local infection of the skin and subcutaneous tissue, unspecified: Secondary | ICD-10-CM

## 2023-10-27 LAB — CBC WITH DIFFERENTIAL/PLATELET
Abs Immature Granulocytes: 0.04 10*3/uL (ref 0.00–0.07)
Basophils Absolute: 0.1 10*3/uL (ref 0.0–0.1)
Basophils Relative: 1 %
Eosinophils Absolute: 0.1 10*3/uL (ref 0.0–0.5)
Eosinophils Relative: 1 %
HCT: 37.4 % — ABNORMAL LOW (ref 39.0–52.0)
Hemoglobin: 12 g/dL — ABNORMAL LOW (ref 13.0–17.0)
Immature Granulocytes: 0 %
Lymphocytes Relative: 16 %
Lymphs Abs: 1.7 10*3/uL (ref 0.7–4.0)
MCH: 28.3 pg (ref 26.0–34.0)
MCHC: 32.1 g/dL (ref 30.0–36.0)
MCV: 88.2 fL (ref 80.0–100.0)
Monocytes Absolute: 1 10*3/uL (ref 0.1–1.0)
Monocytes Relative: 10 %
Neutro Abs: 7.4 10*3/uL (ref 1.7–7.7)
Neutrophils Relative %: 72 %
Platelets: 320 10*3/uL (ref 150–400)
RBC: 4.24 MIL/uL (ref 4.22–5.81)
RDW: 20.7 % — ABNORMAL HIGH (ref 11.5–15.5)
WBC: 10.4 10*3/uL (ref 4.0–10.5)
nRBC: 0 % (ref 0.0–0.2)

## 2023-10-27 LAB — RENAL FUNCTION PANEL
Albumin: 2.6 g/dL — ABNORMAL LOW (ref 3.5–5.0)
Anion gap: 18 — ABNORMAL HIGH (ref 5–15)
BUN: 75 mg/dL — ABNORMAL HIGH (ref 6–20)
CO2: 20 mmol/L — ABNORMAL LOW (ref 22–32)
Calcium: 8.6 mg/dL — ABNORMAL LOW (ref 8.9–10.3)
Chloride: 97 mmol/L — ABNORMAL LOW (ref 98–111)
Creatinine, Ser: 11.15 mg/dL — ABNORMAL HIGH (ref 0.61–1.24)
GFR, Estimated: 5 mL/min — ABNORMAL LOW (ref >60)
Glucose, Bld: 165 mg/dL — ABNORMAL HIGH (ref 70–99)
Phosphorus: 7 mg/dL — ABNORMAL HIGH (ref 2.5–4.6)
Potassium: 5 mmol/L (ref 3.5–5.1)
Sodium: 135 mmol/L (ref 135–145)

## 2023-10-27 LAB — LIPID PANEL
Cholesterol: 132 mg/dL (ref 0–200)
HDL: 28 mg/dL — ABNORMAL LOW (ref >40)
LDL Cholesterol: 83 mg/dL (ref 0–99)
Total CHOL/HDL Ratio: 4.7 {ratio}
Triglycerides: 103 mg/dL (ref <150)
VLDL: 21 mg/dL (ref 0–40)

## 2023-10-27 LAB — CBG MONITORING, ED
Glucose-Capillary: 169 mg/dL — ABNORMAL HIGH (ref 70–99)
Glucose-Capillary: 130 mg/dL — ABNORMAL HIGH (ref 70–99)
Glucose-Capillary: 140 mg/dL — ABNORMAL HIGH (ref 70–99)

## 2023-10-27 LAB — GLUCOSE, CAPILLARY
Glucose-Capillary: 173 mg/dL — ABNORMAL HIGH (ref 70–99)
Glucose-Capillary: 215 mg/dL — ABNORMAL HIGH (ref 70–99)

## 2023-10-27 LAB — TROPONIN I (HIGH SENSITIVITY): Troponin I (High Sensitivity): 129 ng/L (ref <18)

## 2023-10-27 LAB — MRSA NEXT GEN BY PCR, NASAL: MRSA by PCR Next Gen: NOT DETECTED

## 2023-10-27 LAB — HEPATITIS B SURFACE ANTIBODY, QUANTITATIVE: Hep B S AB Quant (Post): 7.4 m[IU]/mL — ABNORMAL LOW

## 2023-10-27 SURGERY — TRANSESOPHAGEAL ECHOCARDIOGRAM (TEE) (CATHLAB)
Anesthesia: Monitor Anesthesia Care

## 2023-10-27 MED ORDER — MELATONIN 3 MG PO TABS
3.0000 mg | ORAL_TABLET | Freq: Every evening | ORAL | Status: DC | PRN
  Administered 2023-10-27 – 2023-10-29 (×3): 3 mg via ORAL
  Filled 2023-10-27 (×3): qty 1

## 2023-10-27 MED ORDER — SODIUM ZIRCONIUM CYCLOSILICATE 10 G PO PACK
10.0000 g | PACK | Freq: Once | ORAL | Status: AC
  Administered 2023-10-27: 10 g via ORAL
  Filled 2023-10-27: qty 1

## 2023-10-27 MED ORDER — HYDROXYZINE HCL 25 MG PO TABS
25.0000 mg | ORAL_TABLET | Freq: Three times a day (TID) | ORAL | Status: DC | PRN
  Administered 2023-10-27 – 2023-10-28 (×2): 25 mg via ORAL
  Filled 2023-10-27 (×2): qty 1

## 2023-10-27 NOTE — Plan of Care (Signed)

## 2023-10-27 NOTE — Progress Notes (Signed)
 Rounding Note    Patient Name: Jeremy [name] Date of Encounter: 10/27/2023  [facility] Cardiologist: Marca Ancona, MD    Subjective   43 year old gentleman with a prior history of aortic valve endocarditis, end-stage renal disease, moderate reduction in LV function who presented yesterday with volume overload.  He has a long history of severe aortic regurgitation.  He was seen by Dr. Maren Beach in 2023 and was instructed to have his teeth evaluated.  He never made the appointment to get his teeth evaluated.  He has a history of becoming very volume overloaded through the weekends.  He admits to eating more salty foods and drinking more fluids than he should during the weekends.  He had dialysis yesterday and has felt much better after getting dialysis.  He also has an infection on his left foot.  He has history of diabetic foot infections.  Today he is feeling quite well.  He is able to lie flat without any issues.  He is hemodynamics are stable.  Inpatient Medications    Scheduled Meds:  atorvastatin  80 mg Oral Daily   carvedilol  50 mg Oral BID WC   Chlorhexidine Gluconate Cloth  6 each Topical Q0600   heparin  5,000 Units Subcutaneous Q8H   hydrALAZINE  50 mg Oral TID   insulin aspart  0-15 Units Subcutaneous TID WC   insulin glargine-yfgn  20 Units Subcutaneous QHS   lidocaine  1 patch Transdermal Q24H   LORazepam  0.5 mg Intravenous Once   pantoprazole  40 mg Oral Daily   sevelamer carbonate  2,400 mg Oral TID WC   sodium zirconium cyclosilicate  10 g Oral Once   Continuous Infusions:  PRN Meds: acetaminophen, ondansetron   Vital Signs    Vitals:   10/27/23 1100 10/27/23 1115 10/27/23 1130 10/27/23 1145  BP: 113/68     Pulse: 92  97 99  Resp: (!) 28 (!) 26 (!) 23 (!) 21  Temp:      TempSrc:      SpO2: 100%  100% 100%  Weight:      Height:        Intake/Output Summary (Last 24 hours) at 10/27/2023 1233 Last data filed at 10/27/2023  0327 Gross per 24 hour  Intake 506.9 ml  Output --  Net 506.9 ml      10/26/2023    8:30 AM 10/26/2023    3:25 AM 10/20/2023    3:36 PM  Last 3 Weights  Weight (lbs) -- 240 lb 255 lb  Weight (kg) -- 108.863 kg 115.667 kg      Telemetry    NSR  - Personally Reviewed  ECG    Nsr  - Personally Reviewed  Physical Exam   GEN: No acute distress.   Neck: No JVD Cardiac: RRR, 2/6 to 3/6 systolic and diastolic "machinery" murmur. Dialysis catheter in left subclavian   Respiratory: Clear to auscultation bilaterally. GI: Soft, nontender, non-distended  MS: No edema; No deformity. Neuro:  Nonfocal  Psych: Normal affect   Labs    High Sensitivity Troponin:   Recent Labs  Lab 10/12/23 0436 10/26/23 0335 10/26/23 0637 10/26/23 1605 10/27/23 0335  TROPONINIHS 44* 54* 95* 112* 129*     Chemistry Recent Labs  Lab 10/26/23 0446 10/26/23 0456 10/26/23 0637 10/27/23 0335  NA 140 140  --  135  K 4.7 4.6  --  5.0  CL 101 106  --  97*  CO2 21*  --   --  20*  GLUCOSE 184* 180*  --  165*  BUN 94* 84*  --  75*  CREATININE 13.28* 13.60* 13.45* 11.15*  CALCIUM 9.3  --   --  8.6*  PROT 7.8  --   --   --   ALBUMIN 3.1*  --   --  2.6*  AST 18  --   --   --   ALT 15  --   --   --   ALKPHOS 45  --   --   --   BILITOT 0.7  --   --   --   GFRNONAA 4*  --  4* 5*  ANIONGAP 18*  --   --  18*    Lipids  Recent Labs  Lab 10/27/23 0335  CHOL 132  TRIG 103  HDL 28*  LDLCALC 83  CHOLHDL 4.7    Hematology Recent Labs  Lab 10/26/23 0335 10/26/23 0456 10/26/23 0649 10/27/23 0335  WBC 15.1*  --  12.8* 10.4  RBC 5.10  --  4.78 4.24  HGB 14.0 15.0 13.3 12.0*  HCT 44.8 44.0 42.0 37.4*  MCV 87.8  --  87.9 88.2  MCH 27.5  --  27.8 28.3  MCHC 31.3  --  31.7 32.1  RDW 21.8*  --  21.3* 20.7*  PLT 372  --  381 320   Thyroid No results for input(s): "TSH", "FREET4" in the last 168 hours.  BNP Recent Labs  Lab 10/26/23 0335  BNP 859.3*    DDimer No results for input(s):  "DDIMER" in the last 168 hours.   Radiology    MR FOOT LEFT WO CONTRAST Result Date: 10/27/2023 CLINICAL DATA:  Clinical concern for left foot osteomyelitis. 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 EXAM: MRI OF THE LEFT FOOT WITHOUT CONTRAST TECHNIQUE: Multiplanar, multisequence MR imaging of the left forefoot was performed. No intravenous contrast was administered. COMPARISON:  Radiographs 10/20/2023 and 09/03/2023.  MRI 09/04/2023. FINDINGS: Bones/Joint/Cartilage There are postsurgical changes from previous amputation of the 3rd ray through the distal 3rd metatarsal and amputation through the base of the 4th proximal phalanx. Apparent sinus tract extending dorsally from the remaining 3rd metatarsal, best seen on the short axis axial images. There is a small fluid collection adjacent to the remaining 3rd metatarsal with heterotopic ossification and prominent marrow edema extending proximally throughout the 3rd metatarsal shaft. Nonspecific low-level T2 hyperintensity within the heads of the 2nd and 4th metatarsals without cortical destruction or abnormal T1 signal. Nonspecific mild T2 hyperintensity within the remaining 4th proximal phalanx. No significant abnormalities within the 1st or 5th rays. No significant midfoot arthropathy or effusion. Ligaments Intact Lisfranc ligament. The collateral ligaments of the remaining metatarsophalangeal joints are intact. Muscles and Tendons Mild muscular atrophy. Postsurgical changes related to previous amputations. No significant tenosynovitis. Soft tissues Postsurgical changes related to previous amputations with apparent sinus tract extending dorsally from the remaining 3rd metatarsal. Subcentimeter fluid collection adjacent to the remaining 3rd metatarsal with surrounding inflammatory changes. No drainable fluid collections are identified. IMPRESSION: 1. Postsurgical changes related to previous amputations of the 3rd ray through the distal 3rd metatarsal and 4th proximal  phalanx. Apparent sinus tract extending dorsally from the remaining 3rd metatarsal with a small fluid collection adjacent to the remaining 3rd metatarsal and prominent marrow edema extending proximally throughout the 3rd metatarsal shaft. 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 2. Nonspecific low-level T2 hyperintensity within the heads of the 2nd and 4th metatarsals without cortical destruction or abnormal T1 signal. 3. No drainable  fluid collections identified. Electronically Signed   By: Carey Bullocks M.D.   On: 10/27/2023 10:30   VAS [place] ABI WITH/WO TBI Result Date: 10/26/2023  LOWER EXTREMITY DOPPLER STUDY Patient Name:  TERIK HAUGHEY  Date of Exam:   10/26/2023 Medical Rec #: [id number]        Accession #:    [id number] Date of Birth: [birth year]        Patient Gender: M Patient Age:   56 years Exam Location:  [facility] Procedure:      VAS [place] ABI WITH/WO TBI Referring Phys: Lyn Hollingshead STANDIFORD --------------------------------------------------------------------------------  Indications: Ulceration. High Risk Factors: Hypertension, hyperlipidemia, Diabetes.  Limitations: Today's exam was limited due to Great toe girth, involuntary              patient movement, an open wound and bandages. Comparison Study: No prior studies. Performing Technologist: Olen Cordial RVT  Examination Guidelines: A complete evaluation includes at minimum, Doppler waveform signals and systolic blood pressure reading at the level of bilateral brachial, anterior tibial, and posterior tibial arteries, when vessel segments are accessible. Bilateral testing is considered an integral part of a complete examination. Photoelectric Plethysmograph (PPG) waveforms and toe systolic pressure readings are included as required and additional duplex testing as needed. Limited examinations for reoccurring indications may be performed as noted.  ABI Findings: +--------+------------------+-----+---------+--------+ Right   Rt  Pressure (mmHg)IndexWaveform Comment  +--------+------------------+-----+---------+--------+ [id number]                    triphasic         +--------+------------------+-----+---------+--------+ PTA     219               1.34 triphasic         +--------+------------------+-----+---------+--------+ DP      229               1.40 triphasic         +--------+------------------+-----+---------+--------+ +--------+------------------+-----+-----------+-------+ Left    Lt Pressure (mmHg)IndexWaveform   Comment +--------+------------------+-----+-----------+-------+ [id number]                    triphasic          +--------+------------------+-----+-----------+-------+ PTA     242               1.48 multiphasic        +--------+------------------+-----+-----------+-------+ DP      244               1.50 multiphasic        +--------+------------------+-----+-----------+-------+ +-------+-----------+-----------+------------+------------+ ABI/TBIToday's ABIToday's TBIPrevious ABIPrevious TBI +-------+-----------+-----------+------------+------------+ Right  1.4                                            +-------+-----------+-----------+------------+------------+ Left   1.5                                            +-------+-----------+-----------+------------+------------+  Summary: Right: Resting right ankle-brachial index indicates noncompressible right lower extremity arteries. Unable to obtain TBI due to great toe girth. Left: Resting left ankle-brachial index indicates noncompressible left lower extremity arteries. Unable to obtain TBI due to great toe girth. *See table(s) above for measurements and observations.  Electronically signed by Sherald Hess MD on 10/26/2023 at  4:47:59 PM.    Final    ECHOCARDIOGRAM COMPLETE Result Date: 10/26/2023    ECHOCARDIOGRAM REPORT   Patient Name:   RAHEEM KOLBE Date of Exam: 10/26/2023 Medical Rec #:  [id number]        Height:       77.0 in Accession #:    [id number]      Weight:       240.0 lb Date of Birth:  [birth year]       BSA:          2.417 m Patient Age:    42 years        BP:           1331/61 mmHg Patient Gender: M               HR:           110 bpm. Exam Location:  Inpatient Procedure: 2D Echo, Cardiac Doppler and Color Doppler (Both Spectral and Color            Flow Doppler were utilized during procedure). Indications:    Dyspnea  History:        Patient has prior history of Echocardiogram examinations, most                 recent 07/18/2022. CHF and Cardiomyopathy; Risk                 Factors:Diabetes.  Sonographer:    Darlys Gales Referring Phys: [id number] JULIE MACHEN IMPRESSIONS  1. Left ventricular ejection fraction, by estimation, is 35 to 40%. The left ventricle has moderately decreased function. The left ventricle demonstrates global hypokinesis. The left ventricular internal cavity size was severely dilated. Left ventricular diastolic function could not be evaluated.  2. Right ventricular systolic function is normal. The right ventricular size is normal. Tricuspid regurgitation signal is inadequate for assessing PA pressure.  3. The mitral valve is normal in structure. Mild mitral valve regurgitation. No evidence of mitral stenosis.  4. Tricuspid valve regurgitation is mild to moderate.  5. The aortic valve is abnormal.     The right and non coronary cusps have focal calcifications that are concerning for possible vegetations. Aortic valve regurgitation is severe. Aortic valve sclerosis/calcification is present, without any evidence of aortic stenosis. Aortic valve mean  gradient measures 5.0 mmHg. Aortic valve Vmax measures 1.46 m/s.  6. The inferior vena cava is normal in size with greater than 50% respiratory variability, suggesting right atrial pressure of 3 mmHg.  7. In the PSAX view at the level of the AV, there is a jet by colorflow perpendicular to the aortic valve into the LVOT. Unclear if this  is eccentric AI an eccentric TR in this area or actually a fistula.  8. Recommend TEE for further evaluation of AV leaflets and AR. FINDINGS  Left Ventricle: Left ventricular ejection fraction, by estimation, is 35 to 40%. The left ventricle has moderately decreased function. The left ventricle demonstrates global hypokinesis. Strain imaging was not performed. The left ventricular internal cavity size was severely dilated. There is no left ventricular hypertrophy. Left ventricular diastolic function could not be evaluated. Right Ventricle: The right ventricular size is normal. No increase in right ventricular wall thickness. Right ventricular systolic function is normal. Tricuspid regurgitation signal is inadequate for assessing PA pressure. Left Atrium: Left atrial size was normal in size. Right Atrium: Right atrial size was normal in size. Pericardium: There is no evidence of pericardial effusion. Mitral Valve: The mitral  valve is normal in structure. Mild mitral valve regurgitation. No evidence of mitral valve stenosis. Tricuspid Valve: The tricuspid valve is normal in structure. Tricuspid valve regurgitation is mild to moderate. No evidence of tricuspid stenosis. Aortic Valve: The right and non coronary cusps have focal calcifications. Cannot rule out vegetations. The aortic valve is abnormal. Aortic valve regurgitation is severe. Aortic regurgitation PHT measures 176 msec. Aortic valve sclerosis/calcification is  present, without any evidence of aortic stenosis. Aortic valve mean gradient measures 5.0 mmHg. Aortic valve peak gradient measures 8.5 mmHg. Pulmonic Valve: The pulmonic valve was normal in structure. Pulmonic valve regurgitation is not visualized. No evidence of pulmonic stenosis. Aorta: The aortic root is normal in size and structure. Venous: The inferior vena cava is normal in size with greater than 50% respiratory variability, suggesting right atrial pressure of 3 mmHg. IAS/Shunts: No atrial level  shunt detected by color flow Doppler. Additional Comments: 3D imaging was not performed.  LEFT VENTRICLE PLAX 2D LVIDd:         7.00 cm Diastology LVIDs:         6.00 cm LV e' lateral:   7.72 cm/s LV PW:         1.20 cm LV E/e' lateral: 14.8 LV IVS:        1.10 cm  RIGHT VENTRICLE RV S prime:     10.00 cm/s TAPSE (M-mode): 1.8 cm LEFT ATRIUM              Index        RIGHT ATRIUM          Index LA Vol (A2C):   122.0 ml 50.48 ml/m  RA Area:     8.91 cm LA Vol (A4C):   63.3 ml  26.19 ml/m  RA Volume:   13.70 ml 5.67 ml/m LA Biplane Vol: 91.0 ml  37.65 ml/m  AORTIC VALVE AV Vmax:           146.00 cm/s AV Vmean:          107.000 cm/s AV VTI:            0.236 m AV Peak Grad:      8.5 mmHg AV Mean Grad:      5.0 mmHg LVOT Vmax:         133.00 cm/s LVOT Vmean:        99.800 cm/s LVOT VTI:          0.228 m LVOT/AV VTI ratio: 0.97 AI PHT:            176 msec MITRAL VALVE MV Area (PHT): 5.42 cm     SHUNTS MV Decel Time: 140 msec     Systemic VTI: 0.23 m MV E velocity: 114.00 cm/s Armanda Magic MD Electronically signed by Armanda Magic MD Signature Date/Time: 10/26/2023/2:12:58 PM    Final    DG Chest Portable 1 View Result Date: 10/26/2023 CLINICAL DATA:  Shortness of breath EXAM: PORTABLE CHEST 1 VIEW COMPARISON:  10/12/2023 FINDINGS: Cardiac shadow is enlarged but stable. Dialysis catheter is again seen. Increasing vascular congestion is noted consistent with fluid overload. Some parenchymal opacity is noted in the bases which may represent edema. IMPRESSION: Changes of CHF likely related to volume overload. Electronically Signed   By: Alcide Clever M.D.   On: 10/26/2023 03:51    Cardiac Studies      Patient Profile     43 y.o. male    Assessment & Plan    1.  Severe  aortic insufficiency: Patient has had severe aortic insufficiency for at least greater than 1 year.  He has issues with volume overload especially after the weekends when he admits to eating more salt and drinking more fluids than he  should.  His heart has dilated significantly since last year's echo.  He now has an infected left toe that needs attention-debridement versus amputation . He also have some teeth that apparently need evaluation and possibly extracted.  He is hemodynamically very stable at this point.  He does have moderately reduced LV systolic function.  He had no significant coronary artery disease by coronary CTA in 2023.   I would place him at moderately increased risk for his sedation for his left toe debridement/amputation and also for his dental extractions.  Patients with even severe aortic regurgitation seem to do okay with sedation and surgical procedures.  He does not need any additional workup from my standpoint.      For questions or updates, please contact [facility] Please consult [email or web address] for contact info under        Signed, Kristeen Miss, MD  10/27/2023, 12:33 PM

## 2023-10-27 NOTE — Progress Notes (Signed)
 PODIATRY PROGRESS NOTE Patient Name: Jeremy [name]  DOB 1981-05-29 DOA 10/26/2023  Hospital Day: 2  Assessment:  43 y.o. male with PMHx significant for ESRD and on HD, CHF, DM2 with neuropathy with dehiscence of prior surgical site including partial third ray amputation with concern for residual osteomyelitis.   MRI left foot: Postsurgical changes previous amputation of third ray through the third distal metatarsal and fourth proximal phalanx with small fluid collection adjacent to the remaining third metatarsal and prominent marrow edema extending proximally through the third metatarsal shaft concerning for recurrent osteomyelitis WBC 10.4 Blood cultures pending  Plan:  -N.p.o. past midnight for the OR tomorrow if cleared by primary team.  Per several messages I have received there is concern on the behalf of the primary care team regarding the patient's ability to undergo surgery safely due to cardiac comorbidities.  I discussed with the patient he is at high risk for surgery however for the cardiac surgeons they would not be able to proceed with valve replacement until source control is obtained.  Will discuss further with the primary team but tentatively plan for surgery tomorrow.  If surgery is found to be prohibitive from the standpoint, would recommend bedside debridement and long-term IV antibiotic therapy though this has high risk of failure to achieve complete source control.  Patient may need repeat OR later this week pending findings tomorrow.  -ABI PVR testing with noncompressible vessels - Continue IV abx broad spectrum pending further culture data - Anticoagulation: Hold pending the OR - Wound care: Leave dressing clean dry and intact until surgery - WB status: Weightbearing left foot preoperatively - Will continue to follow         Corinna Gab, DPM [facility]    Subjective:  Discussed with patient findings from MRI as well as treatment options  involving surgical debridement antibiotic beads and wound VAC versus antibiotic therapy with IV antibiotics for long-term.  Objective:   Vitals:   10/27/23 0633 10/27/23 0906  BP: (!) 147/85 115/61  Pulse: (!) 108 97  Resp: (!) 25 20  Temp:  97.9 F (36.6 C)  SpO2: 100% 100%       Latest Ref Rng & Units 10/27/2023    3:35 AM 10/26/2023    6:49 AM 10/26/2023    4:56 AM  CBC  WBC 4.0 - 10.5 K/uL 10.4  12.8    Hemoglobin 13.0 - 17.0 g/dL 10.2  72.5  36.6   Hematocrit 39.0 - 52.0 % 37.4  42.0  44.0   Platelets 150 - 400 K/uL 320  381         Latest Ref Rng & Units 10/27/2023    3:35 AM 10/26/2023    6:37 AM 10/26/2023    4:56 AM  BMP  Glucose 70 - 99 mg/dL 440   347   BUN 6 - 20 mg/dL 75   84   Creatinine 4.25 - 1.24 mg/dL 95.63  87.56  43.32   Sodium 135 - 145 mmol/L 135   140   Potassium 3.5 - 5.1 mmol/L 5.0   4.6   Chloride 98 - 111 mmol/L 97   106   CO2 22 - 32 mmol/L 20     Calcium 8.9 - 10.3 mg/dL 8.6       General: AAOx3, NAD  Lower Extremity Exam Dressing clean dry and intact to left foot With DP and PT pulses edema noted of the left foot At last visit on the  11th there was concern for dehiscence and malodor of the surgical site though no active drainage or purulence was seen.   Radiographs: 10/20/2023 XR 3 views AP lateral bleak of the left foot weightbearing.  Findings: Possible erosions concerning for osteomyelitis at the resected margin of the third metatarsal.   Pathology from Prior OR: Skin with ulceration and associated necrotizing inflammation.  Bone at the resection margin does not show osteomyelitis.  Fourth toe proximal phalanx no evidence of acute osteomyelitis   Micro from initial procedure: Proteus mirabilis rare bacteriodes Caccae    Radiology:  Results reviewed. See assessment for pertinent imaging results

## 2023-10-27 NOTE — ED Notes (Signed)
 Pt requesting something to help him rest. MD made aware.

## 2023-10-27 NOTE — Progress Notes (Signed)
 Heart Failure Navigator Progress Note  Assessed for Heart & Vascular TOC clinic readiness.  Patient does not meet criteria due to ESRD on hemodialysis.   Navigator will sign off at this time.   Rhae Hammock, BSN, Scientist, clinical (histocompatibility and immunogenetics) Only

## 2023-10-27 NOTE — Progress Notes (Addendum)
 [facility] Progress Note   Subjective: Seen in ED. Denies SOB. Standing weight obtained in ED. He is only 2.8 kg above OP EDW today. Will order Torsemide today    Objective Vitals:   10/27/23 0457 10/27/23 0600 10/27/23 0633 10/27/23 0906  BP: 137/86 (!) 169/92 (!) 147/85 115/61  Pulse: 93 (!) 112 (!) 108 97  Resp: 15 19 (!) 25 20  Temp:    97.9 F (36.6 C)  TempSrc:    Oral  SpO2: 100% 100% 100% 100%  Weight:      Height:       Physical Exam General: Pleasant chronically ill appearing male in NAD Heart:S1,S2 2/6 systolic M 1/6 diastolic M RRR SR on monitor.  Lungs: CTAB A/P Abdomen: NABS, NT Extremities: Wound L foot dressed No LE edema Dialysis Access: [facility] drsg intact   Additional Objective Labs: Basic Metabolic Panel: Recent Labs  Lab 10/26/23 0446 10/26/23 0456 10/26/23 0637 10/27/23 0335  NA 140 140  --  135  K 4.7 4.6  --  5.0  CL 101 106  --  97*  CO2 21*  --   --  20*  GLUCOSE 184* 180*  --  165*  BUN 94* 84*  --  75*  CREATININE 13.28* 13.60* 13.45* 11.15*  CALCIUM 9.3  --   --  8.6*  PHOS  --   --   --  7.0*   Liver Function Tests: Recent Labs  Lab 10/26/23 0446 10/27/23 0335  AST 18  --   ALT 15  --   ALKPHOS 45  --   BILITOT 0.7  --   PROT 7.8  --   ALBUMIN 3.1* 2.6*   No results for input(s): "LIPASE", "AMYLASE" in the last 168 hours. CBC: Recent Labs  Lab 10/26/23 0335 10/26/23 0456 10/26/23 0649 10/27/23 0335  WBC 15.1*  --  12.8* 10.4  NEUTROABS 13.3*  --   --  7.4  HGB 14.0 15.0 13.3 12.0*  HCT 44.8 44.0 42.0 37.4*  MCV 87.8  --  87.9 88.2  PLT 372  --  381 320   Blood Culture    Component Value Date/Time   SDES BLOOD SITE NOT SPECIFIED 10/26/2023 0703   SPECREQUEST  10/26/2023 0703    BOTTLES DRAWN AEROBIC AND ANAEROBIC Blood Culture adequate volume   CULT  10/26/2023 0703    NO GROWTH 1 DAY Performed at [facility], [address] [place], [place] [address]    REPTSTATUS PENDING  10/26/2023 0703    Cardiac Enzymes: No results for input(s): "CKTOTAL", "CKMB", "CKMBINDEX", "TROPONINI" in the last 168 hours. CBG: Recent Labs  Lab 10/26/23 1310 10/26/23 1707 10/26/23 2219 10/27/23 0825  GLUCAP 124* 211* 171* 169*   Iron Studies: No results for input(s): "IRON", "TIBC", "TRANSFERRIN", "FERRITIN" in the last 72 hours. @lablastinr3 @ Studies/Results: VAS [place] ABI WITH/WO TBI Result Date: 10/26/2023  LOWER EXTREMITY DOPPLER STUDY Patient Name:  Jeremy [name]  Date of Exam:   10/26/2023 Medical Rec #: [id number]        Accession #:    [id number] Date of Birth: Jul 16, 1981        Patient Gender: M Patient Age:   7 years Exam Location:  [facility] Procedure:      VAS [place] ABI WITH/WO TBI Referring Phys: Lyn Hollingshead STANDIFORD --------------------------------------------------------------------------------  Indications: Ulceration. High Risk Factors: Hypertension, hyperlipidemia, Diabetes.  Limitations: Today's exam was limited due to Great toe girth, involuntary  patient movement, an open wound and bandages. Comparison Study: No prior studies. Performing Technologist: Olen Cordial RVT  Examination Guidelines: A complete evaluation includes at minimum, Doppler waveform signals and systolic blood pressure reading at the level of bilateral brachial, anterior tibial, and posterior tibial arteries, when vessel segments are accessible. Bilateral testing is considered an integral part of a complete examination. Photoelectric Plethysmograph (PPG) waveforms and toe systolic pressure readings are included as required and additional duplex testing as needed. Limited examinations for reoccurring indications may be performed as noted.  ABI Findings: +--------+------------------+-----+---------+--------+ Right   Rt Pressure (mmHg)IndexWaveform Comment  +--------+------------------+-----+---------+--------+ [id number]                    triphasic          +--------+------------------+-----+---------+--------+ PTA     219               1.34 triphasic         +--------+------------------+-----+---------+--------+ DP      229               1.40 triphasic         +--------+------------------+-----+---------+--------+ +--------+------------------+-----+-----------+-------+ Left    Lt Pressure (mmHg)IndexWaveform   Comment +--------+------------------+-----+-----------+-------+ [id number]                    triphasic          +--------+------------------+-----+-----------+-------+ PTA     242               1.48 multiphasic        +--------+------------------+-----+-----------+-------+ DP      244               1.50 multiphasic        +--------+------------------+-----+-----------+-------+ +-------+-----------+-----------+------------+------------+ ABI/TBIToday's ABIToday's TBIPrevious ABIPrevious TBI +-------+-----------+-----------+------------+------------+ Right  1.4                                            +-------+-----------+-----------+------------+------------+ Left   1.5                                            +-------+-----------+-----------+------------+------------+  Summary: Right: Resting right ankle-brachial index indicates noncompressible right lower extremity arteries. Unable to obtain TBI due to great toe girth. Left: Resting left ankle-brachial index indicates noncompressible left lower extremity arteries. Unable to obtain TBI due to great toe girth. *See table(s) above for measurements and observations.  Electronically signed by Sherald Hess MD on 10/26/2023 at 4:47:59 PM.    Final    ECHOCARDIOGRAM COMPLETE Result Date: 10/26/2023    ECHOCARDIOGRAM REPORT   Patient Name:   Jeremy [name] Date of Exam: 10/26/2023 Medical Rec #:  [id number]       Height:       77.0 in Accession #:    [id number]      Weight:       240.0 lb Date of Birth:  [birth year]       BSA:          2.417 m Patient Age:     42 years        BP:           1331/61 mmHg Patient Gender: M  HR:           110 bpm. Exam Location:  Inpatient Procedure: 2D Echo, Cardiac Doppler and Color Doppler (Both Spectral and Color            Flow Doppler were utilized during procedure). Indications:    Dyspnea  History:        Patient has prior history of Echocardiogram examinations, most                 recent 07/18/2022. CHF and Cardiomyopathy; Risk                 Factors:Diabetes.  Sonographer:    Darlys Gales Referring Phys: [id number] JULIE MACHEN IMPRESSIONS  1. Left ventricular ejection fraction, by estimation, is 35 to 40%. The left ventricle has moderately decreased function. The left ventricle demonstrates global hypokinesis. The left ventricular internal cavity size was severely dilated. Left ventricular diastolic function could not be evaluated.  2. Right ventricular systolic function is normal. The right ventricular size is normal. Tricuspid regurgitation signal is inadequate for assessing PA pressure.  3. The mitral valve is normal in structure. Mild mitral valve regurgitation. No evidence of mitral stenosis.  4. Tricuspid valve regurgitation is mild to moderate.  5. The aortic valve is abnormal.     The right and non coronary cusps have focal calcifications that are concerning for possible vegetations. Aortic valve regurgitation is severe. Aortic valve sclerosis/calcification is present, without any evidence of aortic stenosis. Aortic valve mean  gradient measures 5.0 mmHg. Aortic valve Vmax measures 1.46 m/s.  6. The inferior vena cava is normal in size with greater than 50% respiratory variability, suggesting right atrial pressure of 3 mmHg.  7. In the PSAX view at the level of the AV, there is a jet by colorflow perpendicular to the aortic valve into the LVOT. Unclear if this is eccentric AI an eccentric TR in this area or actually a fistula.  8. Recommend TEE for further evaluation of AV leaflets and AR. FINDINGS  Left  Ventricle: Left ventricular ejection fraction, by estimation, is 35 to 40%. The left ventricle has moderately decreased function. The left ventricle demonstrates global hypokinesis. Strain imaging was not performed. The left ventricular internal cavity size was severely dilated. There is no left ventricular hypertrophy. Left ventricular diastolic function could not be evaluated. Right Ventricle: The right ventricular size is normal. No increase in right ventricular wall thickness. Right ventricular systolic function is normal. Tricuspid regurgitation signal is inadequate for assessing PA pressure. Left Atrium: Left atrial size was normal in size. Right Atrium: Right atrial size was normal in size. Pericardium: There is no evidence of pericardial effusion. Mitral Valve: The mitral valve is normal in structure. Mild mitral valve regurgitation. No evidence of mitral valve stenosis. Tricuspid Valve: The tricuspid valve is normal in structure. Tricuspid valve regurgitation is mild to moderate. No evidence of tricuspid stenosis. Aortic Valve: The right and non coronary cusps have focal calcifications. Cannot rule out vegetations. The aortic valve is abnormal. Aortic valve regurgitation is severe. Aortic regurgitation PHT measures 176 msec. Aortic valve sclerosis/calcification is  present, without any evidence of aortic stenosis. Aortic valve mean gradient measures 5.0 mmHg. Aortic valve peak gradient measures 8.5 mmHg. Pulmonic Valve: The pulmonic valve was normal in structure. Pulmonic valve regurgitation is not visualized. No evidence of pulmonic stenosis. Aorta: The aortic root is normal in size and structure. Venous: The inferior vena cava is normal in size with greater than 50% respiratory variability, suggesting  right atrial pressure of 3 mmHg. IAS/Shunts: No atrial level shunt detected by color flow Doppler. Additional Comments: 3D imaging was not performed.  LEFT VENTRICLE PLAX 2D LVIDd:         7.00 cm Diastology  LVIDs:         6.00 cm LV e' lateral:   7.72 cm/s LV PW:         1.20 cm LV E/e' lateral: 14.8 LV IVS:        1.10 cm  RIGHT VENTRICLE RV S prime:     10.00 cm/s TAPSE (M-mode): 1.8 cm LEFT ATRIUM              Index        RIGHT ATRIUM          Index LA Vol (A2C):   122.0 ml 50.48 ml/m  RA Area:     8.91 cm LA Vol (A4C):   63.3 ml  26.19 ml/m  RA Volume:   13.70 ml 5.67 ml/m LA Biplane Vol: 91.0 ml  37.65 ml/m  AORTIC VALVE AV Vmax:           146.00 cm/s AV Vmean:          107.000 cm/s AV VTI:            0.236 m AV Peak Grad:      8.5 mmHg AV Mean Grad:      5.0 mmHg LVOT Vmax:         133.00 cm/s LVOT Vmean:        99.800 cm/s LVOT VTI:          0.228 m LVOT/AV VTI ratio: 0.97 AI PHT:            176 msec MITRAL VALVE MV Area (PHT): 5.42 cm     SHUNTS MV Decel Time: 140 msec     Systemic VTI: 0.23 m MV E velocity: 114.00 cm/s Armanda Magic MD Electronically signed by Armanda Magic MD Signature Date/Time: 10/26/2023/2:12:58 PM    Final    DG Chest Portable 1 View Result Date: 10/26/2023 CLINICAL DATA:  Shortness of breath EXAM: PORTABLE CHEST 1 VIEW COMPARISON:  10/12/2023 FINDINGS: Cardiac shadow is enlarged but stable. Dialysis catheter is again seen. Increasing vascular congestion is noted consistent with fluid overload. Some parenchymal opacity is noted in the bases which may represent edema. IMPRESSION: Changes of CHF likely related to volume overload. Electronically Signed   By: Alcide Clever M.D.   On: 10/26/2023 03:51   Medications:  ceFEPime (MAXIPIME) IV Stopped (10/26/23 1707)   [START ON 10/28/2023] vancomycin      atorvastatin  80 mg Oral Daily   carvedilol  50 mg Oral BID WC   Chlorhexidine Gluconate Cloth  6 each Topical Q0600   heparin  5,000 Units Subcutaneous Q8H   hydrALAZINE  50 mg Oral TID   insulin aspart  0-15 Units Subcutaneous TID WC   insulin glargine-yfgn  20 Units Subcutaneous QHS   lidocaine  1 patch Transdermal Q24H   LORazepam  0.5 mg Intravenous Once   pantoprazole   40 mg Oral Daily   sevelamer carbonate  2,400 mg Oral TID WC   sodium zirconium cyclosilicate  10 g Oral Once    HD Orders: [facility] MWF 4 hrs 180NRe 500/800 113.5 kg TDC - Heparin 5000 units IV initial bolus Heparin 4000 units IV mid run - Hectorol 7 mcg IV three times per week - Mircera 50 mcg IV q 4 weeks (  last dose 09/30/2023 next dose due 10/28/2023)   Assessment/ Plan: Acute hypoxic resp failure - w/ sig pulm edema and volume overload, on 10 L [place] O2 on admit.  Net UF with HD 10/26/2023 3.5 liters. Standing wt in ED 116.3 kg. Will order Torsemide 80 mg PO on non HD day. UF as tolerated.  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 Cardiology consulted.  ESRD - on HD MWF. Next HD 10/28/2023 HTN - BP better controlled today after volume removed. Continue home meds. Added Torsemide on non HD days.  Volume - as above, sig vol overloaded. Get wt's post HD.  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  Secondary hyperparathyroidism - Continue VDRA, sevelamer binders.  DMT2   Shalayah Beagley H. Kawanda Drumheller NP-C 10/27/2023, 10:16 AM  [employer] [phone]

## 2023-10-27 NOTE — Consult Note (Signed)
 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 male with HTN, CKD on ESRD, severe AR, NICM with EF 20-25%, had I x D with 3rd MT resection of left foot and 4th toe proximal phalanx resection for osteomyelitis/dFU in late December  that has been slow to heal. He has noticed progressive shortness of breath over the last 2 weeks, especially night prior to presenting to EMS. He was found to be hypoxic and required nonrebreather. ED work up revealed fluid overload, despite not missing HD. Labs significant for leukocytosis of 15K but physical exam concerning for ongoing infection to surgical site. Due to concern for congestive heart failure, he underwent TTE which showed focal califications on right and non-coronary cusps concerning for possible vegetation. Blood cx were taken to assess for endocarditis. He also underwent mri of left foot which showed sinus tract extending dorsally from the remaining 3rd metatarls to procixal 3rd metatarsal shaft concerning for recurrent osteomyelitis. Dr Annamary Rummage is planning to take back to OR tomorrow for ray amputation for source control.  He had seen dr Zenaida Niece trigt from CT surgery last year to evaluate for possible AVR due to severe AR and was instructed to follow up with dentistry to manage dental caries and possible tooth extraction for source control in the setting of getting prosthetic valve.  Micro results from 12/28 -- proteus and bacteroides - bone and tissue cx;  blood cx at that time were negative  Past Medical History:  Diagnosis Date   Abscess of left groin    Acute blood loss anemia 11/11/2013   Anemia in chronic kidney disease (CKD)    Asthma    Boil of scrotum 11/21/2015   Chest pain    a. 01/2015 Lexiscan MV: EF 28%, inferior, inferolateral, apical ischemia;  b. 01/2015 Cath: nl cors, PCWP 18 mmHg, CO 9.38 L/min, CI 3.53 L/min/m^2.   CHF (congestive heart failure) (HCC)    Depression    Situational   ESRD (end stage renal disease) (HCC)    TTHS- [place]   Essential hypertension    Family history of adverse reaction to anesthesia    sister- "it was too much for her heart" died   Flash pulmonary edema (HCC) 09/18/2022   GERD (gastroesophageal reflux disease)    Hyperlipidemia    Membranous glomerulonephritis    Morbid obesity (HCC)    Nonischemic cardiomyopathy (HCC)    a. 01/2015 Echo: EF 20-25%, diff HK, Gr 2 DD, Triv AI, mildly dil LA and Ao root.   PONV (postoperative nausea and vomiting)    Seizures (HCC) 02/29/2020   "electralytes were out of wack."   Type II diabetes mellitus (HCC)    a. 01/2015 HbA1c = 8.9.    Allergies:  Allergies  Allergen Reactions   Acyclovir And Related Hives   Imdur [Isosorbide Nitrate]     Headaches from nitrate    Current antibiotics:  MEDICATIONS:  atorvastatin  80 mg Oral Daily   carvedilol  50 mg Oral BID WC   Chlorhexidine Gluconate Cloth  6 each Topical Q0600   heparin  5,000 Units Subcutaneous Q8H   hydrALAZINE  50 mg Oral TID   insulin aspart  0-15 Units Subcutaneous TID WC   insulin glargine-yfgn  20 Units Subcutaneous QHS   lidocaine  1 patch Transdermal Q24H   LORazepam  0.5 mg Intravenous Once   pantoprazole  40 mg Oral Daily   sevelamer carbonate  2,400 mg Oral TID WC    Social History   Tobacco Use   Smoking status: Never   Smokeless tobacco: Never  Vaping Use   Vaping status: Never Used  Substance Use Topics   Alcohol use: No    Alcohol/week: 0.0  standard drinks of alcohol   Drug use: Yes    Types: Marijuana    Comment: last used 09/15/2020    Family History  Problem Relation Age of Onset   Diabetes Mellitus II Mother        died @ 64.   Gastric cancer Mother    CAD Father        died @ 35.   Heart attack Father    Congestive Heart Failure Father    Diabetes Mellitus II Sister    CAD Sister        s/p PCI - age 26.    Review of Systems - shortness of breath, foot pain. Otherwise 12 point ros is negative   OBJECTIVE: Temp:  [97.9 F (36.6 C)-98.7 F (37.1 C)] 98 F (36.7 C) (02/18 1353) Pulse Rate:  [90-113] 98 (02/18 1353) Resp:  [10-34] 20 (02/18 1353) BP: (99-169)/(54-92) 127/65 (02/18 1353) SpO2:  [90 %-100 %] 100 % (02/18 1353) Physical Exam  Constitutional: He is oriented to person, place, and time. He appears well-developed and well-nourished. No distress.  HENT:  Mouth/Throat: Oropharynx is clear and moist. No oropharyngeal exudate.  Cardiovascular: Normal rate, regular rhythm and normal heart sounds. Exam reveals no gallop and no friction rub.  No murmur heard.  Pulmonary/Chest: Effort normal and breath sounds normal. No respiratory distress. He has no wheezes.  Abdominal: Soft. Bowel sounds are normal. He exhibits no distension. There is no tenderness.  Lymphadenopathy:  He has no cervical adenopathy.  Neurological: He is alert and oriented to person, place, and time.  Skin: Skin is warm and dry. No rash noted. No erythema. Left foot also has plantar lesion  Psychiatric: He has a normal mood and affect. His behavior is normal.    LABS: Results for orders placed or performed during the hospital encounter of 10/26/23 (from the past 48 hours)  Troponin I (High Sensitivity)     Status: Abnormal   Collection Time: 10/26/23  3:35 AM  Result Value Ref Range   Troponin I (High Sensitivity) 54 (H) <18 ng/L    Comment: (NOTE) Elevated high sensitivity troponin I (hsTnI) values and significant  changes  across serial measurements may suggest ACS but many other  chronic and acute conditions are known to elevate hsTnI results.  Refer to the "Links" section for chest pain algorithms and additional  guidance. Performed at [facility], [address], [place], [place] [address]   CBC with Differential     Status: Abnormal   Collection Time: 10/26/23  3:35 AM  Result Value Ref Range   WBC 15.1 (H) 4.0 - 10.5 K/uL   RBC 5.10 4.22 - 5.81 MIL/uL  Hemoglobin 14.0 13.0 - 17.0 g/dL   HCT 13.0 86.5 - 78.4 %   MCV 87.8 80.0 - 100.0 fL   MCH 27.5 26.0 - 34.0 pg   MCHC 31.3 30.0 - 36.0 g/dL   RDW 69.6 (H) 29.5 - 28.4 %   Platelets 372 150 - 400 K/uL   nRBC 0.0 0.0 - 0.2 %   Neutrophils Relative % 88 %   Neutro Abs 13.3 (H) 1.7 - 7.7 K/uL   Lymphocytes Relative 6 %   Lymphs Abs 1.0 0.7 - 4.0 K/uL   Monocytes Relative 4 %   Monocytes Absolute 0.6 0.1 - 1.0 K/uL   Eosinophils Relative 1 %   Eosinophils Absolute 0.1 0.0 - 0.5 K/uL   Basophils Relative 1 %   Basophils Absolute 0.1 0.0 - 0.1 K/uL   Immature Granulocytes 0 %   Abs Immature Granulocytes 0.05 0.00 - 0.07 K/uL    Comment: Performed at [facility], [address], [place], [place] [address]  Brain natriuretic peptide     Status: Abnormal   Collection Time: 10/26/23  3:35 AM  Result Value Ref Range   B Natriuretic Peptide 859.3 (H) 0.0 - 100.0 pg/mL    Comment: Performed at [facility], [address] [place], [place] [address]  Resp panel by RT-PCR (RSV, Flu A&B, Covid) Anterior Nasal Swab     Status: None   Collection Time: 10/26/23  3:35 AM   Specimen: Anterior Nasal Swab  Result Value Ref Range   SARS Coronavirus 2 by RT PCR NEGATIVE NEGATIVE   Influenza A by PCR NEGATIVE NEGATIVE   Influenza B by PCR NEGATIVE NEGATIVE    Comment: (NOTE) The Xpert Xpress SARS-CoV-2/FLU/RSV plus assay is intended as an aid in the diagnosis of influenza from Nasopharyngeal swab specimens and should not be used as a sole  basis for treatment. Nasal washings and aspirates are unacceptable for Xpert Xpress SARS-CoV-2/FLU/RSV testing.  Fact Sheet for Patients: BloggerCourse.com  Fact Sheet for Healthcare Providers: SeriousBroker.it  This test is not yet approved or cleared by the [place] FDA and has been authorized for detection and/or diagnosis of SARS-CoV-2 by FDA under an Emergency Use Authorization (EUA). This EUA will remain in effect (meaning this test can be used) for the duration of the COVID-19 declaration under Section 564(b)(1) of the Act, 21 U.S.C. section 360bbb-3(b)(1), unless the authorization is terminated or revoked.     Resp Syncytial Virus by PCR NEGATIVE NEGATIVE    Comment: (NOTE) Fact Sheet for Patients: BloggerCourse.com  Fact Sheet for Healthcare Providers: SeriousBroker.it  This test is not yet approved or cleared by the [place] FDA and has been authorized for detection and/or diagnosis of SARS-CoV-2 by FDA under an Emergency Use Authorization (EUA). This EUA will remain in effect (meaning this test can be used) for the duration of the COVID-19 declaration under Section 564(b)(1) of the Act, 21 U.S.C. section 360bbb-3(b)(1), unless the authorization is terminated or revoked.  Performed at [facility], [address], [place], [place] [address]   Comprehensive metabolic panel     Status: Abnormal   Collection Time: 10/26/23  4:46 AM  Result Value Ref Range   Sodium 140 135 - 145 mmol/L   Potassium 4.7 3.5 - 5.1 mmol/L   Chloride 101 98 - 111 mmol/L   CO2 21 (L) 22 - 32 mmol/L   Glucose, Bld 184 (H) 70 - 99 mg/dL    Comment: Glucose reference range  applies only to samples taken after fasting for at least 8 hours.   BUN 94 (H) 6 - 20 mg/dL   Creatinine, Ser 09.81 (H) 0.61 - 1.24 mg/dL   Calcium 9.3 8.9 - 19.1 mg/dL   Total Protein 7.8 6.5 - 8.1 g/dL    Albumin 3.1 (L) 3.5 - 5.0 g/dL   AST 18 15 - 41 U/L   ALT 15 0 - 44 U/L   Alkaline Phosphatase 45 38 - 126 U/L   Total Bilirubin 0.7 0.0 - 1.2 mg/dL   GFR, Estimated 4 (L) >60 mL/min    Comment: (NOTE) Calculated using the CKD-EPI Creatinine Equation (2021)    Anion gap 18 (H) 5 - 15    Comment: Performed at [facility], [address], [place], [place] [address]  I-stat chem 8, ED (not at [facility], [facility] or [facility])     Status: Abnormal   Collection Time: 10/26/23  4:56 AM  Result Value Ref Range   Sodium 140 135 - 145 mmol/L   Potassium 4.6 3.5 - 5.1 mmol/L   Chloride 106 98 - 111 mmol/L   BUN 84 (H) 6 - 20 mg/dL   Creatinine, Ser 56.21 (H) 0.61 - 1.24 mg/dL   Glucose, Bld 308 (H) 70 - 99 mg/dL    Comment: Glucose reference range applies only to samples taken after fasting for at least 8 hours.   Calcium, Ion 1.06 (L) 1.15 - 1.40 mmol/L   TCO2 24 22 - 32 mmol/L   Hemoglobin 15.0 13.0 - 17.0 g/dL   HCT 65.7 84.6 - 96.2 %  Hepatitis B surface antigen     Status: None   Collection Time: 10/26/23  6:37 AM  Result Value Ref Range   Hepatitis B Surface Ag NON REACTIVE NON REACTIVE    Comment: Performed at [facility], [address], [place], [place] [address]  Hepatitis B surface antibody,quantitative     Status: Abnormal   Collection Time: 10/26/23  6:37 AM  Result Value Ref Range   Hep B S AB Quant (Post) 7.4 (L) Immunity>10 mIU/mL    Comment: (NOTE)  Status of Immunity                     Anti-HBs Level  ------------------                     -------------- Inconsistent with Immunity                  0.0 - 10.0 Consistent with Immunity                         >10.0 Performed At: [facility] [address] [place], [place] [id number] Jolene Schimke MD VO:[phone]   Troponin I (High Sensitivity)     Status: Abnormal   Collection Time: 10/26/23  6:37 AM  Result Value Ref Range   Troponin I (High Sensitivity) 95 (H) <18 ng/L    Comment: RESULT CALLED TO,  READ BACK BY AND VERIFIED WITH CHRISTOPHER APPLEGATE RN.@0742  ON 2.17.25 BY TCALDWELL MT. (NOTE) Elevated high sensitivity troponin I (hsTnI) values and significant  changes across serial measurements may suggest ACS but many other  chronic and acute conditions are known to elevate hsTnI results.  Refer to the "Links" section for chest pain algorithms and additional  guidance. Performed at [facility], [address] [place], [place] [address]  Creatinine, serum     Status: Abnormal   Collection Time: 10/26/23  6:37 AM  Result Value Ref Range   Creatinine, Ser 13.45 (H) 0.61 - 1.24 mg/dL   GFR, Estimated 4 (L) >60 mL/min    Comment: (NOTE) Calculated using the CKD-EPI Creatinine Equation (2021) Performed at [facility], [address] [place], [place] [address]   Beta-hydroxybutyric acid     Status: None   Collection Time: 10/26/23  6:45 AM  Result Value Ref Range   Beta-Hydroxybutyric Acid 0.25 0.05 - 0.27 mmol/L    Comment: Performed at [facility], [address] [place], [place] [address]  Lactic acid, plasma     Status: Abnormal   Collection Time: 10/26/23  6:49 AM  Result Value Ref Range   Lactic Acid, Venous 2.1 (HH) 0.5 - 1.9 mmol/L    Comment: CRITICAL RESULT CALLED TO, READ BACK BY AND VERIFIED WITH CHRISTOPHER APPLEGATE RN.@0755  ON 2.17.25 BY TCALDWELL MT. Performed at [facility], [address], [place], [place] [address]   CBC     Status: Abnormal   Collection Time: 10/26/23  6:49 AM  Result Value Ref Range   WBC 12.8 (H) 4.0 - 10.5 K/uL   RBC 4.78 4.22 - 5.81 MIL/uL   Hemoglobin 13.3 13.0 - 17.0 g/dL   HCT 52.8 41.3 - 24.4 %   MCV 87.9 80.0 - 100.0 fL   MCH 27.8 26.0 - 34.0 pg   MCHC 31.7 30.0 - 36.0 g/dL   RDW 01.0 (H) 27.2 - 53.6 %   Platelets 381 150 - 400 K/uL   nRBC 0.0 0.0 - 0.2 %    Comment: Performed at [facility], [address] [name], [place] [address]  Culture, blood (Routine X 2) w Reflex to ID Panel      Status: None (Preliminary result)   Collection Time: 10/26/23  7:03 AM   Specimen: BLOOD  Result Value Ref Range   Specimen Description BLOOD SITE NOT SPECIFIED    Special Requests      BOTTLES DRAWN AEROBIC AND ANAEROBIC Blood Culture adequate volume   Culture      NO GROWTH 1 DAY Performed at [facility], [address], [place], [place] [address]    Report Status PENDING   CBG monitoring, ED     Status: Abnormal   Collection Time: 10/26/23  1:10 PM  Result Value Ref Range   Glucose-Capillary 124 (H) 70 - 99 mg/dL    Comment: Glucose reference range applies only to samples taken after fasting for at least 8 hours.  Lactic acid, plasma     Status: None   Collection Time: 10/26/23  4:05 PM  Result Value Ref Range   Lactic Acid, Venous 1.8 0.5 - 1.9 mmol/L    Comment: Performed at [facility], [address], [place], [place] [address]  Troponin I (High Sensitivity)     Status: Abnormal   Collection Time: 10/26/23  4:05 PM  Result Value Ref Range   Troponin I (High Sensitivity) 112 (HH) <18 ng/L    Comment: CRITICAL RESULT CALLED TO, READ BACK BY AND VERIFIED WITH WHITNEY H. RN @1738  ON 10/26/23 BY MAB (NOTE) Elevated high sensitivity troponin I (hsTnI) values and significant  changes across serial measurements may suggest ACS but many other  chronic and acute conditions are known to elevate hsTnI results.  Refer to the "Links" section for chest pain algorithms and additional  guidance. Performed at [facility], [address] [place], [place] [address]   CBG monitoring, ED     Status: Abnormal   Collection Time: 10/26/23  5:07 PM  Result Value Ref Range   Glucose-Capillary 211 (H) 70 - 99 mg/dL    Comment: Glucose reference range applies only to samples taken after fasting for at least 8 hours.  CBG monitoring, ED     Status: Abnormal   Collection Time: 10/26/23 10:19 PM  Result Value Ref Range   Glucose-Capillary 171 (H) 70 - 99 mg/dL    Comment:  Glucose reference range applies only to samples taken after fasting for at least 8 hours.  Troponin I (High Sensitivity)     Status: Abnormal   Collection Time: 10/27/23  3:35 AM  Result Value Ref Range   Troponin I (High Sensitivity) 129 (HH) <18 ng/L    Comment: CRITICAL RESULT CALLED TO, READ BACK BY AND VERIFIED WITH Mallory Shirk RN 10/27/23 @0420  BY J. WHITE (NOTE) Elevated high sensitivity troponin I (hsTnI) values and significant  changes across serial measurements may suggest ACS but many other  chronic and acute conditions are known to elevate hsTnI results.  Refer to the "Links" section for chest pain algorithms and additional  guidance. Performed at [facility], [address], [place], [place] [address]   Renal function panel     Status: Abnormal   Collection Time: 10/27/23  3:35 AM  Result Value Ref Range   Sodium 135 135 - 145 mmol/L   Potassium 5.0 3.5 - 5.1 mmol/L    Comment: HEMOLYSIS AT THIS LEVEL MAY AFFECT RESULT   Chloride 97 (L) 98 - 111 mmol/L   CO2 20 (L) 22 - 32 mmol/L   Glucose, Bld 165 (H) 70 - 99 mg/dL    Comment: Glucose reference range applies only to samples taken after fasting for at least 8 hours.   BUN 75 (H) 6 - 20 mg/dL   Creatinine, Ser 09.81 (H) 0.61 - 1.24 mg/dL   Calcium 8.6 (L) 8.9 - 10.3 mg/dL   Phosphorus 7.0 (H) 2.5 - 4.6 mg/dL   Albumin 2.6 (L) 3.5 - 5.0 g/dL   GFR, Estimated 5 (L) >60 mL/min    Comment: (NOTE) Calculated using the CKD-EPI Creatinine Equation (2021)    Anion gap 18 (H) 5 - 15    Comment: Performed at [facility], [address], [place], [place] [address]  CBC with Differential/Platelet     Status: Abnormal   Collection Time: 10/27/23  3:35 AM  Result Value Ref Range   WBC 10.4 4.0 - 10.5 K/uL   RBC 4.24 4.22 - 5.81 MIL/uL   Hemoglobin 12.0 (L) 13.0 - 17.0 g/dL   HCT 82.9 (L) 56.2 - 13.0 %   MCV 88.2 80.0 - 100.0 fL   MCH 28.3 26.0 - 34.0 pg   MCHC 32.1 30.0 - 36.0 g/dL   RDW 86.5 (H) 78.4 - 69.6  %   Platelets 320 150 - 400 K/uL   nRBC 0.0 0.0 - 0.2 %   Neutrophils Relative % 72 %   Neutro Abs 7.4 1.7 - 7.7 K/uL   Lymphocytes Relative 16 %   Lymphs Abs 1.7 0.7 - 4.0 K/uL   Monocytes Relative 10 %   Monocytes Absolute 1.0 0.1 - 1.0 K/uL   Eosinophils Relative 1 %   Eosinophils Absolute 0.1 0.0 - 0.5 K/uL   Basophils Relative 1 %   Basophils  Absolute 0.1 0.0 - 0.1 K/uL   Immature Granulocytes 0 %   Abs Immature Granulocytes 0.04 0.00 - 0.07 K/uL    Comment: Performed at [facility], [address], [place], [place] [address]  Lipid panel     Status: Abnormal   Collection Time: 10/27/23  3:35 AM  Result Value Ref Range   Cholesterol 132 0 - 200 mg/dL   Triglycerides 604 <540 mg/dL   HDL 28 (L) >98 mg/dL   Total CHOL/HDL Ratio 4.7 RATIO   VLDL 21 0 - 40 mg/dL   LDL Cholesterol 83 0 - 99 mg/dL    Comment:        Total Cholesterol/HDL:CHD Risk Coronary Heart Disease Risk Table                     Men   Women  1/2 Average Risk   3.4   3.3  Average Risk       5.0   4.4  2 X Average Risk   9.6   7.1  3 X Average Risk  23.4   11.0        Use the calculated Patient Ratio above and the CHD Risk Table to determine the patient's CHD Risk.        ATP III CLASSIFICATION (LDL):  <100     mg/dL   Optimal  119-147  mg/dL   Near or Above                    Optimal  130-159  mg/dL   Borderline  829-562  mg/dL   High  >130     mg/dL   Very High Performed at [facility], [address] [place], [place] [address]   CBG monitoring, ED     Status: Abnormal   Collection Time: 10/27/23  8:25 AM  Result Value Ref Range   Glucose-Capillary 169 (H) 70 - 99 mg/dL    Comment: Glucose reference range applies only to samples taken after fasting for at least 8 hours.  CBG monitoring, ED     Status: Abnormal   Collection Time: 10/27/23 11:46 AM  Result Value Ref Range   Glucose-Capillary 130 (H) 70 - 99 mg/dL    Comment: Glucose reference range applies only to samples taken  after fasting for at least 8 hours.  CBG monitoring, ED     Status: Abnormal   Collection Time: 10/27/23  1:29 PM  Result Value Ref Range   Glucose-Capillary 140 (H) 70 - 99 mg/dL    Comment: Glucose reference range applies only to samples taken after fasting for at least 8 hours.   *Note: Due to a large number of results and/or encounters for the requested time period, some results have not been displayed. A complete set of results can be found in Results Review.    MICRO: Blood cx pending IMAGING: MR FOOT LEFT WO CONTRAST Result Date: 10/27/2023 CLINICAL DATA:  Clinical concern for left foot osteomyelitis. 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 EXAM: MRI OF THE LEFT FOOT WITHOUT CONTRAST TECHNIQUE: Multiplanar, multisequence MR imaging of the left forefoot was performed. No intravenous contrast was administered. COMPARISON:  Radiographs 10/20/2023 and 09/03/2023.  MRI 09/04/2023. FINDINGS: Bones/Joint/Cartilage There are postsurgical changes from previous amputation of the 3rd ray through the distal 3rd metatarsal and amputation through the base of the 4th proximal phalanx. Apparent sinus tract extending dorsally from the remaining 3rd metatarsal, best seen on the short axis  axial images. There is a small fluid collection adjacent to the remaining 3rd metatarsal with heterotopic ossification and prominent marrow edema extending proximally throughout the 3rd metatarsal shaft. Nonspecific low-level T2 hyperintensity within the heads of the 2nd and 4th metatarsals without cortical destruction or abnormal T1 signal. Nonspecific mild T2 hyperintensity within the remaining 4th proximal phalanx. No significant abnormalities within the 1st or 5th rays. No significant midfoot arthropathy or effusion. Ligaments Intact Lisfranc ligament. The collateral ligaments of the remaining metatarsophalangeal joints are intact. Muscles and Tendons Mild muscular atrophy. Postsurgical changes related to previous amputations.  No significant tenosynovitis. Soft tissues Postsurgical changes related to previous amputations with apparent sinus tract extending dorsally from the remaining 3rd metatarsal. Subcentimeter fluid collection adjacent to the remaining 3rd metatarsal with surrounding inflammatory changes. No drainable fluid collections are identified. IMPRESSION: 1. 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 Apparent sinus tract extending dorsally from the remaining 3rd metatarsal with a small fluid collection adjacent to the remaining 3rd metatarsal and prominent marrow edema extending proximally throughout the 3rd metatarsal shaft. 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 2. Nonspecific low-level T2 hyperintensity within the heads of the 2nd and 4th metatarsals without cortical destruction or abnormal T1 signal. 3. No drainable fluid collections identified. Electronically Signed   By: Carey Bullocks M.D.   On: 10/27/2023 10:30   VAS [place] ABI WITH/WO TBI Result Date: 10/26/2023  LOWER EXTREMITY DOPPLER STUDY Patient Name:  Jeremy [name]  Date of Exam:   10/26/2023 Medical Rec #: [id number]        Accession #:    [id number] Date of Birth: 04-08-1981        Patient Gender: M Patient Age:   19 years Exam Location:  [facility] Procedure:      VAS [place] ABI WITH/WO TBI Referring Phys: Lyn Hollingshead STANDIFORD --------------------------------------------------------------------------------  Indications: Ulceration. High Risk Factors: Hypertension, hyperlipidemia, Diabetes.  Limitations: Today's exam was limited due to Great toe girth, involuntary              patient movement, an open wound and bandages. Comparison Study: No prior studies. Performing Technologist: Olen Cordial RVT  Examination Guidelines: A complete evaluation includes at minimum, Doppler waveform signals and systolic blood pressure reading at the level of bilateral  brachial, anterior tibial, and posterior tibial arteries, when vessel segments are accessible. Bilateral testing is considered an integral part of a complete examination. Photoelectric Plethysmograph (PPG) waveforms and toe systolic pressure readings are included as required and additional duplex testing as needed. Limited examinations for reoccurring indications may be performed as noted.  ABI Findings: +--------+------------------+-----+---------+--------+ Right   Rt Pressure (mmHg)IndexWaveform Comment  +--------+------------------+-----+---------+--------+ [id number]                    triphasic         +--------+------------------+-----+---------+--------+ PTA     219               1.34 triphasic         +--------+------------------+-----+---------+--------+ DP      229               1.40 triphasic         +--------+------------------+-----+---------+--------+ +--------+------------------+-----+-----------+-------+ Left    Lt Pressure (mmHg)IndexWaveform   Comment +--------+------------------+-----+-----------+-------+ [id number]                    triphasic          +--------+------------------+-----+-----------+-------+  PTA     242               1.48 multiphasic        +--------+------------------+-----+-----------+-------+ DP      244               1.50 multiphasic        +--------+------------------+-----+-----------+-------+ +-------+-----------+-----------+------------+------------+ ABI/TBIToday's ABIToday's TBIPrevious ABIPrevious TBI +-------+-----------+-----------+------------+------------+ Right  1.4                                            +-------+-----------+-----------+------------+------------+ Left   1.5                                            +-------+-----------+-----------+------------+------------+  Summary: Right: Resting right ankle-brachial index indicates noncompressible right lower extremity arteries. Unable to  obtain TBI due to great toe girth. Left: Resting left ankle-brachial index indicates noncompressible left lower extremity arteries. Unable to obtain TBI due to great toe girth. *See table(s) above for measurements and observations.  Electronically signed by Sherald Hess MD on 10/26/2023 at 4:47:59 PM.    Final    ECHOCARDIOGRAM COMPLETE Result Date: 10/26/2023    ECHOCARDIOGRAM REPORT   Patient Name:   Jeremy [name] Date of Exam: 10/26/2023 Medical Rec #:  [id number]       Height:       77.0 in Accession #:    [id number]      Weight:       240.0 lb Date of Birth:  07-19-1981       BSA:          2.417 m Patient Age:    42 years        BP:           1331/61 mmHg Patient Gender: M               HR:           110 bpm. Exam Location:  Inpatient Procedure: 2D Echo, Cardiac Doppler and Color Doppler (Both Spectral and Color            Flow Doppler were utilized during procedure). Indications:    Dyspnea  History:        Patient has prior history of Echocardiogram examinations, most                 recent 07/18/2022. CHF and Cardiomyopathy; Risk                 Factors:Diabetes.  Sonographer:    Darlys Gales Referring Phys: [id number] JULIE MACHEN IMPRESSIONS  1. Left ventricular ejection fraction, by estimation, is 35 to 40%. The left ventricle has moderately decreased function. The left ventricle demonstrates global hypokinesis. The left ventricular internal cavity size was severely dilated. Left ventricular diastolic function could not be evaluated.  2. Right ventricular systolic function is normal. The right ventricular size is normal. Tricuspid regurgitation signal is inadequate for assessing PA pressure.  3. The mitral valve is normal in structure. Mild mitral valve regurgitation. No evidence of mitral stenosis.  4. Tricuspid valve regurgitation is mild to moderate.  5. The aortic valve is abnormal.     The right and non coronary cusps have focal calcifications that are concerning for possible vegetations. Aortic  valve regurgitation is severe. Aortic valve sclerosis/calcification is present, without any evidence of aortic stenosis. Aortic valve mean  gradient measures 5.0 mmHg. Aortic valve Vmax measures 1.46 m/s.  6. The inferior vena cava is normal in size with greater than 50% respiratory variability, suggesting right atrial pressure of 3 mmHg.  7. In the PSAX view at the level of the AV, there is a jet by colorflow perpendicular to the aortic valve into the LVOT. Unclear if this is eccentric AI an eccentric TR in this area or actually a fistula.  8. Recommend TEE for further evaluation of AV leaflets and AR. FINDINGS  Left Ventricle: Left ventricular ejection fraction, by estimation, is 35 to 40%. The left ventricle has moderately decreased function. The left ventricle demonstrates global hypokinesis. Strain imaging was not performed. The left ventricular internal cavity size was severely dilated. There is no left ventricular hypertrophy. Left ventricular diastolic function could not be evaluated. Right Ventricle: The right ventricular size is normal. No increase in right ventricular wall thickness. Right ventricular systolic function is normal. Tricuspid regurgitation signal is inadequate for assessing PA pressure. Left Atrium: Left atrial size was normal in size. Right Atrium: Right atrial size was normal in size. Pericardium: There is no evidence of pericardial effusion. Mitral Valve: The mitral valve is normal in structure. Mild mitral valve regurgitation. No evidence of mitral valve stenosis. Tricuspid Valve: The tricuspid valve is normal in structure. Tricuspid valve regurgitation is mild to moderate. No evidence of tricuspid stenosis. Aortic Valve: The right and non coronary cusps have focal calcifications. Cannot rule out vegetations. The aortic valve is abnormal. Aortic valve regurgitation is severe. Aortic regurgitation PHT measures 176 msec. Aortic valve sclerosis/calcification is  present, without any  evidence of aortic stenosis. Aortic valve mean gradient measures 5.0 mmHg. Aortic valve peak gradient measures 8.5 mmHg. Pulmonic Valve: The pulmonic valve was normal in structure. Pulmonic valve regurgitation is not visualized. No evidence of pulmonic stenosis. Aorta: The aortic root is normal in size and structure. Venous: The inferior vena cava is normal in size with greater than 50% respiratory variability, suggesting right atrial pressure of 3 mmHg. IAS/Shunts: No atrial level shunt detected by color flow Doppler. Additional Comments: 3D imaging was not performed.  LEFT VENTRICLE PLAX 2D LVIDd:         7.00 cm Diastology LVIDs:         6.00 cm LV e' lateral:   7.72 cm/s LV PW:         1.20 cm LV E/e' lateral: 14.8 LV IVS:        1.10 cm  RIGHT VENTRICLE RV S prime:     10.00 cm/s TAPSE (M-mode): 1.8 cm LEFT ATRIUM              Index        RIGHT ATRIUM          Index LA Vol (A2C):   122.0 ml 50.48 ml/m  RA Area:     8.91 cm LA Vol (A4C):   63.3 ml  26.19 ml/m  RA Volume:   13.70 ml 5.67 ml/m LA Biplane Vol: 91.0 ml  37.65 ml/m  AORTIC VALVE AV Vmax:           146.00 cm/s AV Vmean:          107.000 cm/s AV VTI:            0.236 m AV Peak Grad:      8.5 mmHg AV Mean Grad:  5.0 mmHg LVOT Vmax:         133.00 cm/s LVOT Vmean:        99.800 cm/s LVOT VTI:          0.228 m LVOT/AV VTI ratio: 0.97 AI PHT:            176 msec MITRAL VALVE MV Area (PHT): 5.42 cm     SHUNTS MV Decel Time: 140 msec     Systemic VTI: 0.23 m MV E velocity: 114.00 cm/s Armanda Magic MD Electronically signed by Armanda Magic MD Signature Date/Time: 10/26/2023/2:12:58 PM    Final    DG Chest Portable 1 View Result Date: 10/26/2023 CLINICAL DATA:  Shortness of breath EXAM: PORTABLE CHEST 1 VIEW COMPARISON:  10/12/2023 FINDINGS: Cardiac shadow is enlarged but stable. Dialysis catheter is again seen. Increasing vascular congestion is noted consistent with fluid overload. Some parenchymal opacity is noted in the bases which may represent  edema. IMPRESSION: Changes of CHF likely related to volume overload. Electronically Signed   By: Alcide Clever M.D.   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 He is clinically stable - please get repeat tissue cultures sent to aerobic, fungal culture and also send to pathology to see we have clear margins.  - please repeat blood cx tomorrow am x 2  - start vancomycin and unasyn post surgery- renally dosed  - will reach out to cardiology to see if can get TEE to evaluate further findings from TTE  Severe AR requiring av replacement, agree with plan for dental evaluation and dental extraction as possible sources for endocarditis - if blood cx negative, then we'll pursue culture negative work up   ESRD on HD = continue on routine schedule per renal team, and will renally dose medications  I have personally spent involved in face-to-face and non-face-to-face activities for this patient on the day of the visit. Professional time spent includes the following activities: Preparing to see the patient (review of tests), Obtaining and/or reviewing separately obtained history (admission/discharge record), Performing a medically appropriate examination and/or evaluation , Ordering medications/tests/procedures, referring and communicating with other health care professionals, Documenting clinical information in the EMR, Independently interpreting results (not separately reported), Communicating results to the patient/family/caregiver, Counseling and educating the patient/family/caregiver and Care coordination (not separately reported).

## 2023-10-27 NOTE — Hospital Course (Signed)
 2/18: Feeling much better. SOB resolved. Chest pain improved. Tolerated dialysis well.

## 2023-10-27 NOTE — Progress Notes (Addendum)
 Subjective:  Overnight Events: Patient had a troponin level resulted at 129, no chest pain  Interval: Patient says that his shortness of breath, cough, chest pain (that only occurred during coughing) have completely resolved. He feels well at this time. The patient reports that he has not had any new feelings of fever, chills, toe pain. He says that he would have went to get his dental extractions to prepare for valve surgery last year, but his dentist stopped working.  Objective:  Vital signs in last 24 hours: Vitals:   10/27/23 0318 10/27/23 0457 10/27/23 0600 10/27/23 0633  BP: 129/72 137/86 (!) 169/92 (!) 147/85  Pulse: 99 93 (!) 112 (!) 108  Resp: 15 15 19  (!) 25  Temp: 98 F (36.7 C)     TempSrc: Oral     SpO2: 99% 100% 100% 100%  Weight:      Height:       Weight change:   Intake/Output Summary (Last 24 hours) at 10/27/2023 0735 Last data filed at 10/27/2023 0327 Gross per 24 hour  Intake 506.9 ml  Output 3500 ml  Net -2993.1 ml   Physical Exam Constitutional:      General: He is not in acute distress. HENT:     Head: Normocephalic.     Nose:     Comments: Normal to external appearance    Mouth/Throat:     Dentition: Dental caries present.     Comments: Left lower molars Eyes:     Comments: Gross movements intact  Cardiovascular:     Rate and Rhythm: Normal rate and regular rhythm.     Heart sounds: Murmur heard.  Pulmonary:     Effort: Pulmonary effort is normal. No respiratory distress.     Breath sounds: No rales.     Comments: No distress or dyspnea, on room air Chest:     Comments: Port site left chest clean, dry, intact Musculoskeletal:     Cervical back: Normal range of motion.     Right lower leg: No edema.     Left lower leg: No edema.  Skin:    Coloration: Skin is not jaundiced or pale.     Comments: Left foot end has purulence under fourth toe, sutures present at amputation site of 3rd toe. Ulcer of foot base. See photos.  Neurological:      General: No focal deficit present.     Mental Status: He is alert and oriented to person, place, and time.  Psychiatric:        Mood and Affect: Mood normal.        Behavior: Behavior normal.          ABI Summary:  Right: Resting right ankle-brachial index indicates noncompressible right  lower extremity arteries.   Unable to obtain TBI due to great toe girth.  Left: Resting left ankle-brachial index indicates noncompressible left  lower extremity arteries.   Unable to obtain TBI due to great toe girth.  *See table(s) above for measurements and observations.   TTEcho IMPRESSIONS   1. Left ventricular ejection fraction, by estimation, is 35 to 40%. The  left ventricle has moderately decreased function. The left ventricle  demonstrates global hypokinesis. The left ventricular internal cavity size  was severely dilated. Left  ventricular diastolic function could not be evaluated.   2. Right ventricular systolic function is normal. The right ventricular  size is normal. Tricuspid regurgitation signal is inadequate for assessing  PA pressure.   3. The mitral valve  is normal in structure. Mild mitral valve  regurgitation. No evidence of mitral stenosis.   4. Tricuspid valve regurgitation is mild to moderate.   5. The aortic valve is abnormal.      The right and non coronary cusps have focal calcifications that are  concerning for possible vegetations. Aortic valve regurgitation is severe.  Aortic valve sclerosis/calcification is present, without any evidence of  aortic stenosis. Aortic valve mean   gradient measures 5.0 mmHg. Aortic valve Vmax measures 1.46 m/s.   6. The inferior vena cava is normal in size with greater than 50%  respiratory variability, suggesting right atrial pressure of 3 mmHg.   7. In the PSAX view at the level of the AV, there is a jet by colorflow  perpendicular to the aortic valve into the LVOT. Unclear if this is  eccentric AI an eccentric TR in  this area or actually a fistula.   8. Recommend TEE for further evaluation of AV leaflets and AR.   MRI Left foot IMPRESSION: 1. 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 Apparent sinus tract extending dorsally from the remaining 3rd metatarsal with a small fluid collection adjacent to the remaining 3rd metatarsal and prominent marrow edema extending proximally throughout the 3rd metatarsal shaft. 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 2. Nonspecific low-level T2 hyperintensity within the heads of the 2nd and 4th metatarsals without cortical destruction or abnormal T1 signal. 3. No drainable fluid collections identified.  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 He was seen by CT surgery and ID. CVTS recommended dental extractions prior to surgery. He did not follow up as outpatient with dental, CVTS, and was lost to follow-up with ID as well.  Acute Hypoxemic Respiratory Failure Flash Pulmonary Edema HFpEF Exacerbation Patient presents for acute shortness of breath, says that he stopped lasix "last year" (sister thinks it was stopped more recently) and is not able to say why. His chest pain is only when coughing. Patient recently had the flu. BNP 859. Chest x-ray showing concerns for worsening  pulmonary edema consistent with CHF exacerbation. He reports he is 20 pounds above his dry weight. Difficult to ascertain dry weight, but somewhere between 235-255. 2/18: In order for cardiology/CT surg to do aortic valve surgery, he needs dental extraction and foot surgery first. Contacting outpatient dental today. Podiatry will do foot amputation tomorrow. We appreciate that cardiology, podiatry, and ID have helped with care coordination. Will hold lasix today as symptoms resolved. -As needed nasal cannula O2 for respiratory support -SpO2 goal greater than 92%, can give supplemental oxygen to reach goal  Hypertension BP up to 200/90 this morning, now 136/61 after dialysis. -Coreg 50mg  BID, hydralazine 50mg  TID.  Osteomyelitis of third metatarsal of left foot Wound dehiscence of previous surgical site of fourth phalanx of left  toe WBC 12.8 down from 15.1 earlier this morning. 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 He is followed by podiatry outpatient; consulted for this hospitalization. MRI shows findings suspicious for recurrent osteomyelitis. Podiatry advises empiric antibiotics and plans to do surgery tomorrow (2/19). -IV Vanc/Cefepime -Blood culture NGTD 1 day -ID ordered new Bcx, will follow   ESRD on dialysis MWF Patient has not missed any dialysis. Cr 13.2, which is around baseline. BUN 94. -Dialysis per nephrology -Resume home Ted Mcalpine -Renal diet, renal dose meds  Type 2 diabetes mellitus Patient has a past medical history of type 2 diabetes mellitus. His most recent A1c was 6.8 about 2 months ago. This is well-controlled at this time. BHA negative, glucose 130-211. -Start sliding scale insulin -Continue home Lantus 20 units daily   Anion gap metabolic acidosis Patient does have anion gap of 18. This is likely from chronic uremia. Dialyzed MWF. Lactate 2.1, BHA .25, CMP shows glucose of 184.   GERD Patient has a past medical history of  GERD.  He does report having some heartburn concerns.  He is on Protonix 20 mg daily. Will increase to 40 mg daily. -Increase Protonix to 40 mg daily  #Demand Ischemia / Type 2 MI - Troponin trended to a peak of 129 overnight w/o clinical history or EKG suggestive of ACS.  - Demand ischemia most likely secondary to severe aortic regurgitation - Low suspicion for PE, pericarditis, myocarditis - Denies chest pain since dialysis  Diet: Carb-Modified VTE: Heparin IVF: None,None Code: Full   Prior to Admission Living Arrangement: Home, living with girlfriend Anticipated Discharge Location: Home Barriers to Discharge: Clinical improvement and definitive plan for osteomyelitis   Dispo: Admit patient to Inpatient with expected length of stay greater than 2 midnights.   LOS: 1 day   Kayleen Memos, Medical Student 10/27/2023, 7:35 AM  Attestation for Student Documentation:  I personally was present and performed or re-performed the history, physical exam and medical decision-making activities of this service and have verified that the service and findings are accurately documented in the student's note.  Morrie Sheldon, MD 10/27/2023, 2:32 PM

## 2023-10-27 NOTE — ED Notes (Signed)
 MD notified of trop 129 this AM

## 2023-10-28 ENCOUNTER — Inpatient Hospital Stay (HOSPITAL_COMMUNITY): Payer: 59 | Admitting: Anesthesiology

## 2023-10-28 ENCOUNTER — Inpatient Hospital Stay (HOSPITAL_COMMUNITY): Payer: 59

## 2023-10-28 ENCOUNTER — Encounter (HOSPITAL_COMMUNITY): Payer: Self-pay | Admitting: Internal Medicine

## 2023-10-28 ENCOUNTER — Ambulatory Visit: Payer: 59 | Admitting: Vascular Surgery

## 2023-10-28 ENCOUNTER — Encounter (HOSPITAL_COMMUNITY)
Admission: EM | Disposition: A | Discharge status: Home / Self Care | Payer: Self-pay | Source: Home / Self Care | Attending: Internal Medicine

## 2023-10-28 ENCOUNTER — Ambulatory Visit (HOSPITAL_COMMUNITY): Payer: 59

## 2023-10-28 ENCOUNTER — Other Ambulatory Visit: Payer: Self-pay

## 2023-10-28 DIAGNOSIS — M86172 Other acute osteomyelitis, left ankle and foot: Secondary | ICD-10-CM

## 2023-10-28 DIAGNOSIS — J9601 Acute respiratory failure with hypoxia: Secondary | ICD-10-CM | POA: Diagnosis not present

## 2023-10-28 DIAGNOSIS — E1122 Type 2 diabetes mellitus with diabetic chronic kidney disease: Secondary | ICD-10-CM | POA: Diagnosis not present

## 2023-10-28 DIAGNOSIS — I351 Nonrheumatic aortic (valve) insufficiency: Secondary | ICD-10-CM | POA: Diagnosis not present

## 2023-10-28 DIAGNOSIS — E1169 Type 2 diabetes mellitus with other specified complication: Secondary | ICD-10-CM

## 2023-10-28 DIAGNOSIS — I132 Hypertensive heart and chronic kidney disease with heart failure and with stage 5 chronic kidney disease, or end stage renal disease: Secondary | ICD-10-CM

## 2023-10-28 DIAGNOSIS — I330 Acute and subacute infective endocarditis: Secondary | ICD-10-CM

## 2023-10-28 DIAGNOSIS — N186 End stage renal disease: Secondary | ICD-10-CM | POA: Diagnosis not present

## 2023-10-28 DIAGNOSIS — I509 Heart failure, unspecified: Secondary | ICD-10-CM | POA: Diagnosis not present

## 2023-10-28 HISTORY — PX: AMPUTATION: SHX166

## 2023-10-28 LAB — CBC WITH DIFFERENTIAL/PLATELET
Abs Immature Granulocytes: 0.02 10*3/uL (ref 0.00–0.07)
Basophils Absolute: 0.1 10*3/uL (ref 0.0–0.1)
Basophils Relative: 1 %
Eosinophils Absolute: 0.3 10*3/uL (ref 0.0–0.5)
Eosinophils Relative: 3 %
HCT: 40.6 % (ref 39.0–52.0)
Hemoglobin: 12.9 g/dL — ABNORMAL LOW (ref 13.0–17.0)
Immature Granulocytes: 0 %
Lymphocytes Relative: 26 %
Lymphs Abs: 2 10*3/uL (ref 0.7–4.0)
MCH: 27.4 pg (ref 26.0–34.0)
MCHC: 31.8 g/dL (ref 30.0–36.0)
MCV: 86.2 fL (ref 80.0–100.0)
Monocytes Absolute: 0.8 10*3/uL (ref 0.1–1.0)
Monocytes Relative: 11 %
Neutro Abs: 4.4 10*3/uL (ref 1.7–7.7)
Neutrophils Relative %: 59 %
Platelets: 344 10*3/uL (ref 150–400)
RBC: 4.71 MIL/uL (ref 4.22–5.81)
RDW: 20.2 % — ABNORMAL HIGH (ref 11.5–15.5)
WBC: 7.6 10*3/uL (ref 4.0–10.5)
nRBC: 0 % (ref 0.0–0.2)

## 2023-10-28 LAB — RENAL FUNCTION PANEL
Albumin: 3.1 g/dL — ABNORMAL LOW (ref 3.5–5.0)
Anion gap: 16 — ABNORMAL HIGH (ref 5–15)
BUN: 43 mg/dL — ABNORMAL HIGH (ref 6–20)
CO2: 25 mmol/L (ref 22–32)
Calcium: 9.2 mg/dL (ref 8.9–10.3)
Chloride: 92 mmol/L — ABNORMAL LOW (ref 98–111)
Creatinine, Ser: 7.41 mg/dL — ABNORMAL HIGH (ref 0.61–1.24)
GFR, Estimated: 9 mL/min — ABNORMAL LOW (ref >60)
Glucose, Bld: 133 mg/dL — ABNORMAL HIGH (ref 70–99)
Phosphorus: 4.9 mg/dL — ABNORMAL HIGH (ref 2.5–4.6)
Potassium: 3.5 mmol/L (ref 3.5–5.1)
Sodium: 133 mmol/L — ABNORMAL LOW (ref 135–145)

## 2023-10-28 LAB — POCT I-STAT, CHEM 8
BUN: 44 mg/dL — ABNORMAL HIGH (ref 6–20)
Calcium, Ion: 1.09 mmol/L — ABNORMAL LOW (ref 1.15–1.40)
Chloride: 98 mmol/L (ref 98–111)
Creatinine, Ser: 10 mg/dL — ABNORMAL HIGH (ref 0.61–1.24)
Glucose, Bld: 109 mg/dL — ABNORMAL HIGH (ref 70–99)
HCT: 41 % (ref 39.0–52.0)
Hemoglobin: 13.9 g/dL (ref 13.0–17.0)
Potassium: 4 mmol/L (ref 3.5–5.1)
Sodium: 135 mmol/L (ref 135–145)
TCO2: 25 mmol/L (ref 22–32)

## 2023-10-28 LAB — GLUCOSE, CAPILLARY
Glucose-Capillary: 186 mg/dL — ABNORMAL HIGH (ref 70–99)
Glucose-Capillary: 155 mg/dL — ABNORMAL HIGH (ref 70–99)
Glucose-Capillary: 112 mg/dL — ABNORMAL HIGH (ref 70–99)
Glucose-Capillary: 61 mg/dL — ABNORMAL LOW (ref 70–99)
Glucose-Capillary: 195 mg/dL — ABNORMAL HIGH (ref 70–99)

## 2023-10-28 LAB — SURGICAL PCR SCREEN
MRSA, PCR: NEGATIVE
Staphylococcus aureus: NEGATIVE

## 2023-10-28 SURGERY — AMPUTATION, FOOT, PARTIAL
Anesthesia: Monitor Anesthesia Care | Site: Foot | Laterality: Left

## 2023-10-28 MED ORDER — ANTICOAGULANT SODIUM CITRATE 4% (200MG/5ML) IV SOLN: 5.0000 mL | Status: DC | PRN

## 2023-10-28 MED ORDER — DEXTROSE 50 % IV SOLN
INTRAVENOUS | Status: AC
  Filled 2023-10-28: qty 50

## 2023-10-28 MED ORDER — PROPOFOL 10 MG/ML IV BOLUS
INTRAVENOUS | Status: DC | PRN
  Administered 2023-10-28 (×2): 20 mg via INTRAVENOUS
  Administered 2023-10-28: 30 mg via INTRAVENOUS
  Administered 2023-10-28: 20 mg via INTRAVENOUS
  Administered 2023-10-28: 50 mg via INTRAVENOUS

## 2023-10-28 MED ORDER — ALTEPLASE 2 MG IJ SOLR: 2.0000 mg | Freq: Once | INTRAMUSCULAR | Status: DC | PRN

## 2023-10-28 MED ORDER — HEPARIN SODIUM (PORCINE) 1000 UNIT/ML IJ SOLN
INTRAMUSCULAR | Status: AC
  Administered 2023-10-28: 1000 [IU]
  Filled 2023-10-28: qty 9

## 2023-10-28 MED ORDER — CHLORHEXIDINE GLUCONATE 0.12 % MT SOLN: 15.0000 mL | Freq: Once | OROMUCOSAL | Status: AC

## 2023-10-28 MED ORDER — TOBRAMYCIN SULFATE 80 MG/2ML IJ SOLN
INTRAMUSCULAR | Status: DC | PRN
  Administered 2023-10-28: 240 mg via INTRAMUSCULAR

## 2023-10-28 MED ORDER — VANCOMYCIN HCL IN DEXTROSE 1-5 GM/200ML-% IV SOLN
1000.0000 mg | INTRAVENOUS | Status: DC
  Administered 2023-10-28: 1000 mg via INTRAVENOUS
  Filled 2023-10-28: qty 200

## 2023-10-28 MED ORDER — NON FORMULARY
Status: DC | PRN
  Administered 2023-10-28: 5 mL via SURGICAL_CAVITY

## 2023-10-28 MED ORDER — MIDAZOLAM HCL 2 MG/2ML IJ SOLN
INTRAMUSCULAR | Status: DC | PRN
  Administered 2023-10-28: 2 mg via INTRAVENOUS

## 2023-10-28 MED ORDER — BUPIVACAINE HCL (PF) 0.5 % IJ SOLN
INTRAMUSCULAR | Status: AC
  Filled 2023-10-28: qty 10

## 2023-10-28 MED ORDER — SODIUM CHLORIDE 0.9 % IV SOLN: INTRAVENOUS | Status: DC

## 2023-10-28 MED ORDER — MIDAZOLAM HCL 2 MG/2ML IJ SOLN
INTRAMUSCULAR | Status: AC
  Filled 2023-10-28: qty 2

## 2023-10-28 MED ORDER — FENTANYL CITRATE (PF) 100 MCG/2ML IJ SOLN: 25.0000 ug | INTRAMUSCULAR | Status: DC | PRN

## 2023-10-28 MED ORDER — ACETAMINOPHEN 500 MG PO TABS: 1000.0000 mg | ORAL_TABLET | Freq: Once | ORAL | Status: DC | PRN

## 2023-10-28 MED ORDER — LIDOCAINE 2% (20 MG/ML) 5 ML SYRINGE
INTRAMUSCULAR | Status: DC | PRN
  Administered 2023-10-28: 60 mg via INTRAVENOUS

## 2023-10-28 MED ORDER — VANCOMYCIN HCL 1000 MG IV SOLR
INTRAVENOUS | Status: DC | PRN
  Administered 2023-10-28: 500 mg

## 2023-10-28 MED ORDER — ONDANSETRON HCL 4 MG/2ML IJ SOLN
INTRAMUSCULAR | Status: DC | PRN
  Administered 2023-10-28: 4 mg via INTRAVENOUS

## 2023-10-28 MED ORDER — OXYCODONE HCL 5 MG PO TABS: 5.0000 mg | ORAL_TABLET | Freq: Once | ORAL | Status: DC | PRN

## 2023-10-28 MED ORDER — ACETAMINOPHEN 10 MG/ML IV SOLN: 1000.0000 mg | Freq: Once | INTRAVENOUS | Status: DC | PRN

## 2023-10-28 MED ORDER — HEPARIN SODIUM (PORCINE) 1000 UNIT/ML DIALYSIS: 1000.0000 [IU] | INTRAMUSCULAR | Status: DC | PRN

## 2023-10-28 MED ORDER — FENTANYL CITRATE (PF) 250 MCG/5ML IJ SOLN
INTRAMUSCULAR | Status: AC
  Filled 2023-10-28: qty 5

## 2023-10-28 MED ORDER — PHENYLEPHRINE 80 MCG/ML (10ML) SYRINGE FOR IV PUSH (FOR BLOOD PRESSURE SUPPORT)
PREFILLED_SYRINGE | INTRAVENOUS | Status: DC | PRN
  Administered 2023-10-28 (×3): 160 ug via INTRAVENOUS

## 2023-10-28 MED ORDER — FENTANYL CITRATE (PF) 250 MCG/5ML IJ SOLN
INTRAMUSCULAR | Status: DC | PRN
Start: 2023-10-28 — End: 2023-10-28
  Administered 2023-10-28: 50 ug via INTRAVENOUS

## 2023-10-28 MED ORDER — ALBUMIN HUMAN 5 % IV SOLN: INTRAVENOUS | Status: DC | PRN

## 2023-10-28 MED ORDER — DEXTROSE 50 % IV SOLN
25.0000 mL | Freq: Once | INTRAVENOUS | Status: AC
  Administered 2023-10-28: 25 mL via INTRAVENOUS

## 2023-10-28 MED ORDER — VANCOMYCIN HCL 500 MG IV SOLR
INTRAVENOUS | Status: AC
  Filled 2023-10-28: qty 10

## 2023-10-28 MED ORDER — GLYCOPYRROLATE 0.2 MG/ML IJ SOLN
INTRAMUSCULAR | Status: DC | PRN
  Administered 2023-10-28: .2 mg via INTRAVENOUS

## 2023-10-28 MED ORDER — OXYCODONE HCL 5 MG/5ML PO SOLN: 5.0000 mg | Freq: Once | ORAL | Status: DC | PRN

## 2023-10-28 MED ORDER — INSULIN ASPART 100 UNIT/ML IJ SOLN
0.0000 [IU] | Freq: Three times a day (TID) | INTRAMUSCULAR | Status: DC
  Administered 2023-10-29: 2 [IU] via SUBCUTANEOUS

## 2023-10-28 MED ORDER — HEPARIN SODIUM (PORCINE) 1000 UNIT/ML DIALYSIS: 5000.0000 [IU] | Freq: Once | INTRAMUSCULAR | Status: DC

## 2023-10-28 MED ORDER — ACETAMINOPHEN 160 MG/5ML PO SOLN: 1000.0000 mg | Freq: Once | ORAL | Status: DC | PRN

## 2023-10-28 MED ORDER — LIDOCAINE HCL 1 % IJ SOLN
INTRAMUSCULAR | Status: AC
  Filled 2023-10-28: qty 20

## 2023-10-28 MED ORDER — ORAL CARE MOUTH RINSE: 15.0000 mL | Freq: Once | OROMUCOSAL | Status: AC

## 2023-10-28 MED ORDER — SODIUM CHLORIDE 0.9 % IV SOLN
3.0000 g | Freq: Two times a day (BID) | INTRAVENOUS | Status: DC
  Administered 2023-10-28 – 2023-10-29 (×2): 3 g via INTRAVENOUS
  Filled 2023-10-28 (×2): qty 8

## 2023-10-28 MED ORDER — TOBRAMYCIN SULFATE 80 MG/2ML IJ SOLN
INTRAMUSCULAR | Status: AC
  Filled 2023-10-28: qty 4

## 2023-10-28 MED ORDER — CHLORHEXIDINE GLUCONATE 0.12 % MT SOLN
OROMUCOSAL | Status: AC
  Administered 2023-10-28: 15 mL via OROMUCOSAL
  Filled 2023-10-28: qty 15

## 2023-10-28 SURGICAL SUPPLY — 41 items
BEADS BIO ARTH CALC SULFAT 5CC (Bone Implant) IMPLANT
BIOBEADS ARTH CALC SULFATE 5CC (Bone Implant) ×1 IMPLANT
BLADE OSC/SAGITTAL MD 5.5X18 (BLADE) ×1 IMPLANT
BLADE SURG 10 STRL SS (BLADE) ×1 IMPLANT
BLADE SURG 15 STRL LF DISP TIS (BLADE) ×2 IMPLANT
BNDG COHESIVE 3X5 TAN ST LF (GAUZE/BANDAGES/DRESSINGS) ×1 IMPLANT
BNDG ELASTIC 3INX 5YD STR LF (GAUZE/BANDAGES/DRESSINGS) ×1 IMPLANT
BNDG ELASTIC 4INX 5YD STR LF (GAUZE/BANDAGES/DRESSINGS) IMPLANT
BNDG ESMARK 4X9 LF (GAUZE/BANDAGES/DRESSINGS) ×1 IMPLANT
BNDG GAUZE DERMACEA FLUFF 4 (GAUZE/BANDAGES/DRESSINGS) IMPLANT
CANISTER WOUNDNEG PRESSURE 500 (CANNISTER) IMPLANT
CHLORAPREP W/TINT 26 (MISCELLANEOUS) ×1 IMPLANT
DRAPE DERMATAC (DRAPES) IMPLANT
ELECT REM PT RETURN 9FT ADLT (ELECTROSURGICAL) ×1 IMPLANT
ELECTRODE REM PT RTRN 9FT ADLT (ELECTROSURGICAL) ×1 IMPLANT
GAUZE SPONGE 2X2 STRL 8-PLY (GAUZE/BANDAGES/DRESSINGS) ×2 IMPLANT
GAUZE SPONGE 4X4 12PLY STRL (GAUZE/BANDAGES/DRESSINGS) ×1 IMPLANT
GAUZE STRETCH 2X75IN STRL (MISCELLANEOUS) ×1 IMPLANT
GAUZE XEROFORM 1X8 LF (GAUZE/BANDAGES/DRESSINGS) ×1 IMPLANT
GLOVE BIO SURGEON STRL SZ7.5 (GLOVE) ×1 IMPLANT
GLOVE BIOGEL PI IND STRL 7.5 (GLOVE) ×1 IMPLANT
GOWN STRL REUS W/ TWL LRG LVL3 (GOWN DISPOSABLE) ×2 IMPLANT
KIT BASIN OR (CUSTOM PROCEDURE TRAY) ×1 IMPLANT
NDL BIOPSY JAMSHIDI 8X6 (NEEDLE) IMPLANT
NDL HYPO 25X1 1.5 SAFETY (NEEDLE) ×2 IMPLANT
NDLE BIOPSY JAMSHIDI OT 8X11 (NEEDLE) IMPLANT
NEEDLE BIOPSY JAMSHIDI 8X6 (NEEDLE) IMPLANT
NEEDLE HYPO 25X1 1.5 SAFETY (NEEDLE) ×2 IMPLANT
PACK ORTHO EXTREMITY (CUSTOM PROCEDURE TRAY) ×1 IMPLANT
PADDING CAST ABS COTTON 4X4 ST (CAST SUPPLIES) ×2 IMPLANT
SPIKE FLUID TRANSFER (MISCELLANEOUS) ×2 IMPLANT
STAPLER VISISTAT 35W (STAPLE) IMPLANT
STOCKINETTE 4X48 STRL (DRAPES) ×1 IMPLANT
SUT ETHILON 3 0 FSLX (SUTURE) ×1 IMPLANT
SUT PROLENE 2 0 SH DA (SUTURE) IMPLANT
SUT PROLENE 4 0 PS 2 18 (SUTURE) ×2 IMPLANT
SWAB COLLECTION DEVICE MRSA (MISCELLANEOUS) IMPLANT
SWAB CULTURE ESWAB REG 1ML (MISCELLANEOUS) IMPLANT
SYR CONTROL 10ML LL (SYRINGE) ×2 IMPLANT
UNDERPAD 30X36 HEAVY ABSORB (UNDERPADS AND DIAPERS) ×1 IMPLANT
WATER STERILE IRR 1000ML POUR (IV SOLUTION) ×1 IMPLANT

## 2023-10-28 NOTE — Progress Notes (Addendum)
 [facility] Progress Note   Subjective: Seen post HD. No C/Os. Refused standing wt. Going to OR later today.  Net UF 2.1 liters.   Objective Vitals:   10/28/23 0530 10/28/23 0600 10/28/23 0630 10/28/23 0640  BP: (!) 113/58 132/60 129/68 (!) 145/53  Pulse: 62 76 (!) 103 99  Resp: 20 20 19 18   Temp:      TempSrc:      SpO2: 100% 100% 100% 100%  Weight:      Height:       Physical Exam General: Pleasant chronically ill appearing male in NAD Heart:S1,S2 2/6 systolic M 1/6 diastolic M RRR SR on monitor.  Lungs: CTAB A/P Abdomen: NABS, NT Extremities: Wound L foot dressed No LE edema Dialysis Access: [facility] dr[facility] intact  Additional Objective Labs: Basic Metabolic Panel: Recent Labs  Lab 10/26/23 0446 10/26/23 0456 10/26/23 0637 10/27/23 0335  NA 140 140  --  135  K 4.7 4.6  --  5.0  CL 101 106  --  97*  CO2 21*  --   --  20*  GLUCOSE 184* 180*  --  165*  BUN 94* 84*  --  75*  CREATININE 13.28* 13.60* 13.45* 11.15*  CALCIUM 9.3  --   --  8.6*  PHOS  --   --   --  7.0*   Liver Function Tests: Recent Labs  Lab 10/26/23 0446 10/27/23 0335  AST 18  --   ALT 15  --   ALKPHOS 45  --   BILITOT 0.7  --   PROT 7.8  --   ALBUMIN 3.1* 2.6*   No results for input(s): "LIPASE", "AMYLASE" in the last 168 hours. CBC: Recent Labs  Lab 10/26/23 0335 10/26/23 0456 10/26/23 0649 10/27/23 0335  WBC 15.1*  --  12.8* 10.4  NEUTROABS 13.3*  --   --  7.4  HGB 14.0 15.0 13.3 12.0*  HCT 44.8 44.0 42.0 37.4*  MCV 87.8  --  87.9 88.2  PLT 372  --  381 320   Blood Culture    Component Value Date/Time   SDES BLOOD SITE NOT SPECIFIED 10/26/2023 0703   SPECREQUEST  10/26/2023 0703    BOTTLES DRAWN AEROBIC AND ANAEROBIC Blood Culture adequate volume   CULT  10/26/2023 0703    NO GROWTH 1 DAY Performed at [facility], [address], [place], [place] [address]    REPTSTATUS PENDING 10/26/2023 0703    Cardiac Enzymes: No results for input(s):  "CKTOTAL", "CKMB", "CKMBINDEX", "TROPONINI" in the last 168 hours. CBG: Recent Labs  Lab 10/27/23 0825 10/27/23 1146 10/27/23 1329 10/27/23 1643 10/27/23 2115  GLUCAP 169* 130* 140* 173* 215*   Iron Studies: No results for input(s): "IRON", "TIBC", "TRANSFERRIN", "FERRITIN" in the last 72 hours. @lablastinr3 @ Studies/Results: MR FOOT LEFT WO CONTRAST Result Date: 10/27/2023 CLINICAL DATA:  Clinical concern for left foot osteomyelitis. 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 EXAM: MRI OF THE LEFT FOOT WITHOUT CONTRAST TECHNIQUE: Multiplanar, multisequence MR imaging of the left forefoot was performed. No intravenous contrast was administered. COMPARISON:  Radiographs 10/20/2023 and 09/03/2023.  MRI 09/04/2023. FINDINGS: Bones/Joint/Cartilage There are postsurgical changes from previous amputation of the 3rd ray through the distal 3rd metatarsal and amputation through the base of the 4th proximal phalanx. Apparent sinus tract extending dorsally from the remaining 3rd metatarsal, best seen on the short axis axial images. There is a small fluid collection adjacent to the remaining 3rd metatarsal with heterotopic ossification and prominent marrow  edema extending proximally throughout the 3rd metatarsal shaft. Nonspecific low-level T2 hyperintensity within the heads of the 2nd and 4th metatarsals without cortical destruction or abnormal T1 signal. Nonspecific mild T2 hyperintensity within the remaining 4th proximal phalanx. No significant abnormalities within the 1st or 5th rays. No significant midfoot arthropathy or effusion. Ligaments Intact Lisfranc ligament. The collateral ligaments of the remaining metatarsophalangeal joints are intact. Muscles and Tendons Mild muscular atrophy. Postsurgical changes related to previous amputations. No significant tenosynovitis. Soft tissues Postsurgical changes related to previous amputations with apparent sinus tract extending dorsally from the remaining 3rd metatarsal.  Subcentimeter fluid collection adjacent to the remaining 3rd metatarsal with surrounding inflammatory changes. No drainable fluid collections are identified. IMPRESSION: 1. 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 Apparent sinus tract extending dorsally from the remaining 3rd metatarsal with a small fluid collection adjacent to the remaining 3rd metatarsal and prominent marrow edema extending proximally throughout the 3rd metatarsal shaft. 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 2. Nonspecific low-level T2 hyperintensity within the heads of the 2nd and 4th metatarsals without cortical destruction or abnormal T1 signal. 3. No drainable fluid collections identified. Electronically Signed   By: Carey Bullocks M.D.   On: 10/27/2023 10:30   VAS [place] ABI WITH/WO TBI Result Date: 10/26/2023  LOWER EXTREMITY DOPPLER STUDY Patient Name:  NAVEEN CLARDY  Date of Exam:   10/26/2023 Medical Rec #: [id number]        Accession #:    [id number] Date of Birth: 11-Feb-1981        Patient Gender: M Patient Age:   43 years Exam Location:  [facility] Procedure:      VAS [place] ABI WITH/WO TBI Referring Phys: Lyn Hollingshead STANDIFORD --------------------------------------------------------------------------------  Indications: Ulceration. High Risk Factors: Hypertension, hyperlipidemia, Diabetes.  Limitations: Today's exam was limited due to Great toe girth, involuntary              patient movement, an open wound and bandages. Comparison Study: No prior studies. Performing Technologist: Olen Cordial RVT  Examination Guidelines: A complete evaluation includes at minimum, Doppler waveform signals and systolic blood pressure reading at the level of bilateral brachial, anterior tibial, and posterior tibial arteries, when vessel segments are accessible. Bilateral testing is considered an integral part of a complete examination. Photoelectric  Plethysmograph (PPG) waveforms and toe systolic pressure readings are included as required and additional duplex testing as needed. Limited examinations for reoccurring indications may be performed as noted.  ABI Findings: +--------+------------------+-----+---------+--------+ Right   Rt Pressure (mmHg)IndexWaveform Comment  +--------+------------------+-----+---------+--------+ [id number]                    triphasic         +--------+------------------+-----+---------+--------+ PTA     219               1.34 triphasic         +--------+------------------+-----+---------+--------+ DP      229               1.40 triphasic         +--------+------------------+-----+---------+--------+ +--------+------------------+-----+-----------+-------+ Left    Lt Pressure (mmHg)IndexWaveform   Comment +--------+------------------+-----+-----------+-------+ [id number]                    triphasic          +--------+------------------+-----+-----------+-------+ PTA     242  1.48 multiphasic        +--------+------------------+-----+-----------+-------+ DP      244               1.50 multiphasic        +--------+------------------+-----+-----------+-------+ +-------+-----------+-----------+------------+------------+ ABI/TBIToday's ABIToday's TBIPrevious ABIPrevious TBI +-------+-----------+-----------+------------+------------+ Right  1.4                                            +-------+-----------+-----------+------------+------------+ Left   1.5                                            +-------+-----------+-----------+------------+------------+  Summary: Right: Resting right ankle-brachial index indicates noncompressible right lower extremity arteries. Unable to obtain TBI due to great toe girth. Left: Resting left ankle-brachial index indicates noncompressible left lower extremity arteries. Unable to obtain TBI due to great toe girth. *See  table(s) above for measurements and observations.  Electronically signed by Sherald Hess MD on 10/26/2023 at 4:47:59 PM.    Final    ECHOCARDIOGRAM COMPLETE Result Date: 10/26/2023    ECHOCARDIOGRAM REPORT   Patient Name:   GLEN KESINGER Date of Exam: 10/26/2023 Medical Rec #:  [id number]       Height:       77.0 in Accession #:    [id number]      Weight:       240.0 lb Date of Birth:  1980/10/26       BSA:          2.417 m Patient Age:    42 years        BP:           1331/61 mmHg Patient Gender: M               HR:           110 bpm. Exam Location:  Inpatient Procedure: 2D Echo, Cardiac Doppler and Color Doppler (Both Spectral and Color            Flow Doppler were utilized during procedure). Indications:    Dyspnea  History:        Patient has prior history of Echocardiogram examinations, most                 recent 07/18/2022. CHF and Cardiomyopathy; Risk                 Factors:Diabetes.  Sonographer:    Darlys Gales Referring Phys: [id number] JULIE MACHEN IMPRESSIONS  1. Left ventricular ejection fraction, by estimation, is 35 to 40%. The left ventricle has moderately decreased function. The left ventricle demonstrates global hypokinesis. The left ventricular internal cavity size was severely dilated. Left ventricular diastolic function could not be evaluated.  2. Right ventricular systolic function is normal. The right ventricular size is normal. Tricuspid regurgitation signal is inadequate for assessing PA pressure.  3. The mitral valve is normal in structure. Mild mitral valve regurgitation. No evidence of mitral stenosis.  4. Tricuspid valve regurgitation is mild to moderate.  5. The aortic valve is abnormal.     The right and non coronary cusps have focal calcifications that are concerning for possible vegetations. Aortic valve regurgitation is severe. Aortic valve sclerosis/calcification is present, without any evidence of aortic stenosis. Aortic valve mean  gradient  measures 5.0 mmHg. Aortic valve  Vmax measures 1.46 m/s.  6. The inferior vena cava is normal in size with greater than 50% respiratory variability, suggesting right atrial pressure of 3 mmHg.  7. In the PSAX view at the level of the AV, there is a jet by colorflow perpendicular to the aortic valve into the LVOT. Unclear if this is eccentric AI an eccentric TR in this area or actually a fistula.  8. Recommend TEE for further evaluation of AV leaflets and AR. FINDINGS  Left Ventricle: Left ventricular ejection fraction, by estimation, is 35 to 40%. The left ventricle has moderately decreased function. The left ventricle demonstrates global hypokinesis. Strain imaging was not performed. The left ventricular internal cavity size was severely dilated. There is no left ventricular hypertrophy. Left ventricular diastolic function could not be evaluated. Right Ventricle: The right ventricular size is normal. No increase in right ventricular wall thickness. Right ventricular systolic function is normal. Tricuspid regurgitation signal is inadequate for assessing PA pressure. Left Atrium: Left atrial size was normal in size. Right Atrium: Right atrial size was normal in size. Pericardium: There is no evidence of pericardial effusion. Mitral Valve: The mitral valve is normal in structure. Mild mitral valve regurgitation. No evidence of mitral valve stenosis. Tricuspid Valve: The tricuspid valve is normal in structure. Tricuspid valve regurgitation is mild to moderate. No evidence of tricuspid stenosis. Aortic Valve: The right and non coronary cusps have focal calcifications. Cannot rule out vegetations. The aortic valve is abnormal. Aortic valve regurgitation is severe. Aortic regurgitation PHT measures 176 msec. Aortic valve sclerosis/calcification is  present, without any evidence of aortic stenosis. Aortic valve mean gradient measures 5.0 mmHg. Aortic valve peak gradient measures 8.5 mmHg. Pulmonic Valve: The pulmonic valve was normal in structure.  Pulmonic valve regurgitation is not visualized. No evidence of pulmonic stenosis. Aorta: The aortic root is normal in size and structure. Venous: The inferior vena cava is normal in size with greater than 50% respiratory variability, suggesting right atrial pressure of 3 mmHg. IAS/Shunts: No atrial level shunt detected by color flow Doppler. Additional Comments: 3D imaging was not performed.  LEFT VENTRICLE PLAX 2D LVIDd:         7.00 cm Diastology LVIDs:         6.00 cm LV e' lateral:   7.72 cm/s LV PW:         1.20 cm LV E/e' lateral: 14.8 LV IVS:        1.10 cm  RIGHT VENTRICLE RV S prime:     10.00 cm/s TAPSE (M-mode): 1.8 cm LEFT ATRIUM              Index        RIGHT ATRIUM          Index LA Vol (A2C):   122.0 ml 50.48 ml/m  RA Area:     8.91 cm LA Vol (A4C):   63.3 ml  26.19 ml/m  RA Volume:   13.70 ml 5.67 ml/m LA Biplane Vol: 91.0 ml  37.65 ml/m  AORTIC VALVE AV Vmax:           146.00 cm/s AV Vmean:          107.000 cm/s AV VTI:            0.236 m AV Peak Grad:      8.5 mmHg AV Mean Grad:      5.0 mmHg LVOT Vmax:         133.00 cm/s  LVOT Vmean:        99.800 cm/s LVOT VTI:          0.228 m LVOT/AV VTI ratio: 0.97 AI PHT:            176 msec MITRAL VALVE MV Area (PHT): 5.42 cm     SHUNTS MV Decel Time: 140 msec     Systemic VTI: 0.23 m MV E velocity: 114.00 cm/s Armanda Magic MD Electronically signed by Armanda Magic MD Signature Date/Time: 10/26/2023/2:12:58 PM    Final    Medications:  anticoagulant sodium citrate      atorvastatin  80 mg Oral Daily   carvedilol  50 mg Oral BID WC   Chlorhexidine Gluconate Cloth  6 each Topical Q0600   heparin  5,000 Units Subcutaneous Q8H   heparin  5,000 Units Dialysis Once in dialysis   hydrALAZINE  50 mg Oral TID   insulin aspart  0-15 Units Subcutaneous TID WC   insulin glargine-yfgn  20 Units Subcutaneous QHS   lidocaine  1 patch Transdermal Q24H   LORazepam  0.5 mg Intravenous Once   pantoprazole  40 mg Oral Daily   sevelamer carbonate  2,400 mg  Oral TID WC     HD Orders: [place] MWF 4 hrs 180NRe 500/800 113.5 kg TDC - Heparin 5000 units IV initial bolus Heparin 4000 units IV mid run - Hectorol 7 mcg IV three times per week - Mircera 50 mcg IV q 4 weeks (last dose 09/30/2023 next dose due 10/28/2023)     Assessment/ Plan: Acute hypoxic resp failure - w/ sig pulm edema and volume overload, on 10 L [place] O2 on admit.  Net UF with HD 10/26/2023 3.5 liters. Net UF 2.1 this AM. Still slightly above OP EDW but volume appears stable. Ordered Torsemide 80 mg PO on non HD day. UF as tolerated.  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 Cardiology consulted. Severe AR-Needs valve replacement. Dental extractions not done. Per primary.   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 ABX will start post op. Needs TEE.  ESRD - on HD MWF. Next HD 10/30/2023 HTN - BP better controlled today after volume removed. Continue home meds. Added Torsemide on non HD days.  Volume - as above, sig vol overloaded. Get wt's post HD.  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  Secondary hyperparathyroidism - Continue VDRA, sevelamer binders.  DMT2     Benjamyn Hestand H. Matsuko Kretz NP-C 10/28/2023, 7:00 AM  [employer] [phone]

## 2023-10-28 NOTE — Op Note (Signed)
 Full Operative Report  Date of Operation: 12:51 PM, 10/28/2023   Patient: Jeremy [name] - 43 y.o. male  Surgeon: Pilar Plate, DPM   Assistant: None  Diagnosis: Osteomyelitis of left foot  Procedure:  1.  Irrigation debridement with partial resection third metatarsal, left foot 2.  Bone biopsy fourth metatarsal head and fourth proximal phalanx, left foot 3.  Insertion dissolvable calcium sulfate antibiotic beads, left foot 4.  Application negative pressure wound therapy, 5.5 x 2 x 2 cm, left foot    Anesthesia: Anesthesia type not filed in the log.  No responsible provider has been recorded for the case.  No anesthesia staff entered.   Estimated Blood Loss: Minimal  Hemostasis: 1) Anatomical dissection, mechanical compression, electrocautery 2) No tourniquet was used  Implants: * No implants in log *  Materials: Wound VAC black sponge   Injectables: 1) Pre-operatively: 10 Cc of 50:50 mixture 1%lidocaine plain and 0.5% marcaine plain 2) Post-operatively: None   Specimens: Pathology: 3rd met with proximal margin inked, 4th met head biopsy, 4th proximal phalanx biopsy  Microbiology: Wound swab culture deep, bone for culture 3rd met stump site   Antibiotics: IV antibiotics given per schedule on the floor  Drains: None  Complications: Patient tolerated the procedure well without complication.   Operative findings: As below in detailed report  Indications for Procedure: [name] presents to Pilar Plate, DPM with a chief complaint of wound dehiscence with malodor and drainage following prior partial third ray amputation approximately 6 to 7 weeks ago.  Concern for reinfection of the third metatarsal with osteomyelitis of the stump site.  The patient has failed conservative treatments of various modalities. At this time the patient has elected to proceed with surgical correction. All alternatives, risks, and complications of the procedures were  thoroughly explained to the patient. Patient exhibits appropriate understanding of all discussion points and informed consent was signed and obtained in the chart with no guarantees to surgical outcome given or implied.  Description of Procedure: Patient was brought to the operating room. Patient remained on their hospital bed in the supine position. A surgical timeout was performed and all members of the operating room, the procedure, and the surgical site were identified. anesthesia occurred as per anesthesia record. Local anesthetic as previously described was then injected about the operative field in a local infiltrative block.  The operative lower extremity as noted above was then prepped and draped in the usual sterile manner. The following procedure then began.  Attention was directed to the dehiscence of the prior surgical site on the left foot.  There is noted to be necrotic fibrotic tissues present in the wound base.  Rongeur was used to debride the wound excisionally with removal of necrotic and fibrotic tissues.  Next a 10 blade was used to extend the proximal aspect of the dehiscence proximally towards the third metatarsal base.  Dissection was carried down to the third metatarsal shaft.  Clearly the metatarsal was dissected so as to expose the majority of the third metatarsal.  Next a sagittal saw was used to resect the third metatarsal proximally near the base.  The third metatarsal distally was then resected.  The proximal margin was inked.  A bone culture was harvested from the distal aspect of the third metatarsal at the prior resection margin which was sent for culture.  The remainder of the metatarsal was sent for pathology.  A deep wound culture swab was then obtained.  Further excisional debridement was then  performed with excision of necrotic fibrotic tissues in the infected tissue present in the wound bed.  The plantar wound underlying the prior third metatarsal head was inspected and  noted to be almost nearly fully healed at this time.  Next a Jamshidi needle was then used to harvest a bone biopsy from the fourth metatarsal head.  This bone biopsy was passed back table and sent for pathology.  A second bone biopsy was taken from the fourth proximal phalanx.  This was taken with rongeur and passed the back table and sent for pathology.  Next dissolvable calcium sulfate antibiotic beads mixed with vancomycin and tobramycin were prepared on the back table.  Small beads were made.  These were then implanted deep within the surgical site.  Next the surgical site was then partially closed with 2-0 Prolene suture especially dorsally along the newly made incision.  The remainder of the incision distally was left open to allow to drain and wound VAC was applied.  The wound measured 5.5 x 2 cm x 2 cm . wound vac black sponge was then cut to size and implanted within the surgical site.  The wound VAC was then applied and covered with derma tec drape and set to suction 125 mmHg continuous suction.  Additional wound VAC drape was needed to ensure there were no leaks present at the time of completion of the procedure.    The surgical site was then dressed with 4x4 kerlix ace wrap. The patient tolerated both the procedure and anesthesia well with vital signs stable throughout. The patient was transferred in good condition and all vital signs stable  from the OR to recovery under the discretion of anesthesia.  Condition: Vital signs stable, neurovascular status unchanged from preoperative   Surgical plan: Appear to have clean resection margin at third metatarsal resection site.  Closed dorsal incision and applied wound VAC.  Will need return to the OR later this week or weekend for repeat debridement possible graft application and closure versus continued VAC.  Follow pathology and cultures.  NWB to left foot.   The patient will be nonweigthbearing  to the operative limb until further instructed.  The dressing is to remain clean, dry, and intact. Will continue to follow unless noted elsewhere.   Carlena Hurl, DPM [facility]

## 2023-10-28 NOTE — H&P (View-Only) (Signed)
 Rounding Note    Patient Name: Jeremy [name] Date of Encounter: 10/28/2023  [facility] Cardiologist: Marca Ancona, MD    Subjective   43 year old gentleman with a prior history of aortic valve endocarditis, end-stage renal disease, moderate reduction in LV function who presented yesterday with volume overload.  He has a long history of severe aortic regurgitation.  He was seen by Dr. Maren Beach in 2023 and was instructed to have his teeth evaluated.  He never made the appointment to get his teeth evaluated.  He has a history of becoming very volume overloaded through the weekends.  He admits to eating more salty foods and drinking more fluids than he should during the weekends.  He had dialysis yesterday and has felt much better after getting dialysis.  He is scheduled for debridement/ amputation of his infected toe today .  Had HD early this am    Inpatient Medications    Scheduled Meds:  atorvastatin  80 mg Oral Daily   carvedilol  50 mg Oral BID WC   Chlorhexidine Gluconate Cloth  6 each Topical Q0600   heparin  5,000 Units Subcutaneous Q8H   hydrALAZINE  50 mg Oral TID   insulin aspart  0-15 Units Subcutaneous TID WC   insulin glargine-yfgn  20 Units Subcutaneous QHS   lidocaine  1 patch Transdermal Q24H   LORazepam  0.5 mg Intravenous Once   pantoprazole  40 mg Oral Daily   sevelamer carbonate  2,400 mg Oral TID WC   Continuous Infusions:  PRN Meds: acetaminophen, hydrOXYzine, melatonin, ondansetron   Vital Signs    Vitals:   10/28/23 0600 10/28/23 0630 10/28/23 0640 10/28/23 0710  BP: 132/60 129/68 (!) 145/53 (!) 145/53  Pulse: 76 (!) 103 99 87  Resp: 20 19 18 18   Temp:    97.9 F (36.6 C)  TempSrc:      SpO2: 100% 100% 100% 98%  Weight:    (S) 114 kg  Height:        Intake/Output Summary (Last 24 hours) at 10/28/2023 0815 Last data filed at 10/28/2023 0710 Gross per 24 hour  Intake --  Output 2100 ml  Net -2100 ml      10/28/2023     7:10 AM 10/26/2023    8:30 AM 10/26/2023    3:25 AM  Last 3 Weights  Weight (lbs) 251 lb 5.2 oz  -- 240 lb  Weight (kg) 114 kg  -- 108.863 kg     Significant value      Telemetry    NSR  - Personally Reviewed  ECG    Nsr  - Personally Reviewed  Physical Exam   GEN: NAD , unlabored breathing  Neck: No JVD Cardiac: RRR, 2-3/6 systolic , diastolic murmur at LSB   Respiratory:clear  GI: Soft, nontender, non-distended  MS: left foot wrapped  Neuro:  nonfocal   Psych: Normal affect   Labs    High Sensitivity Troponin:   Recent Labs  Lab 10/12/23 0436 10/26/23 0335 10/26/23 0637 10/26/23 1605 10/27/23 0335  TROPONINIHS 44* 54* 95* 112* 129*     Chemistry Recent Labs  Lab 10/26/23 0446 10/26/23 0456 10/26/23 0637 10/27/23 0335  NA 140 140  --  135  K 4.7 4.6  --  5.0  CL 101 106  --  97*  CO2 21*  --   --  20*  GLUCOSE 184* 180*  --  165*  BUN 94* 84*  --  75*  CREATININE 13.28*  13.60* 13.45* 11.15*  CALCIUM 9.3  --   --  8.6*  PROT 7.8  --   --   --   ALBUMIN 3.1*  --   --  2.6*  AST 18  --   --   --   ALT 15  --   --   --   ALKPHOS 45  --   --   --   BILITOT 0.7  --   --   --   GFRNONAA 4*  --  4* 5*  ANIONGAP 18*  --   --  18*    Lipids  Recent Labs  Lab 10/27/23 0335  CHOL 132  TRIG 103  HDL 28*  LDLCALC 83  CHOLHDL 4.7    Hematology Recent Labs  Lab 10/26/23 0649 10/27/23 0335 10/28/23 0742  WBC 12.8* 10.4 7.6  RBC 4.78 4.24 4.71  HGB 13.3 12.0* 12.9*  HCT 42.0 37.4* 40.6  MCV 87.9 88.2 86.2  MCH 27.8 28.3 27.4  MCHC 31.7 32.1 31.8  RDW 21.3* 20.7* 20.2*  PLT 381 320 344   Thyroid No results for input(s): "TSH", "FREET4" in the last 168 hours.  BNP Recent Labs  Lab 10/26/23 0335  BNP 859.3*    DDimer No results for input(s): "DDIMER" in the last 168 hours.   Radiology    MR FOOT LEFT WO CONTRAST Result Date: 10/27/2023 CLINICAL DATA:  Clinical concern for left foot osteomyelitis. 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 EXAM: MRI OF THE LEFT FOOT WITHOUT CONTRAST TECHNIQUE: Multiplanar, multisequence MR imaging of the left forefoot was performed. No intravenous contrast was administered. COMPARISON:  Radiographs 10/20/2023 and 09/03/2023.  MRI 09/04/2023. FINDINGS: Bones/Joint/Cartilage There are postsurgical changes from previous amputation of the 3rd ray through the distal 3rd metatarsal and amputation through the base of the 4th proximal phalanx. Apparent sinus tract extending dorsally from the remaining 3rd metatarsal, best seen on the short axis axial images. There is a small fluid collection adjacent to the remaining 3rd metatarsal with heterotopic ossification and prominent marrow edema extending proximally throughout the 3rd metatarsal shaft. Nonspecific low-level T2 hyperintensity within the heads of the 2nd and 4th metatarsals without cortical destruction or abnormal T1 signal. Nonspecific mild T2 hyperintensity within the remaining 4th proximal phalanx. No significant abnormalities within the 1st or 5th rays. No significant midfoot arthropathy or effusion. Ligaments Intact Lisfranc ligament. The collateral ligaments of the remaining metatarsophalangeal joints are intact. Muscles and Tendons Mild muscular atrophy. Postsurgical changes related to previous amputations. No significant tenosynovitis. Soft tissues Postsurgical changes related to previous amputations with apparent sinus tract extending dorsally from the remaining 3rd metatarsal. Subcentimeter fluid collection adjacent to the remaining 3rd metatarsal with surrounding inflammatory changes. No drainable fluid collections are identified. IMPRESSION: 1. 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 Apparent sinus tract extending dorsally from the remaining 3rd metatarsal with a small fluid collection adjacent to the remaining 3rd metatarsal and prominent marrow edema  extending proximally throughout the 3rd metatarsal shaft. 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 2. Nonspecific low-level T2 hyperintensity within the heads of the 2nd and 4th metatarsals without cortical destruction or abnormal T1 signal. 3. No drainable fluid collections identified. Electronically Signed   By: Carey Bullocks M.D.   On: 10/27/2023 10:30   VAS [place] ABI WITH/WO TBI Result Date: 10/26/2023  LOWER EXTREMITY DOPPLER STUDY Patient Name:  Jeremy [name]  Date of Exam:   10/26/2023 Medical  Rec #: [id number]        Accession #:    [id number] Date of Birth: Dec 07, 1980        Patient Gender: M Patient Age:   31 years Exam Location:  [facility] Procedure:      VAS [place] ABI WITH/WO TBI Referring Phys: Lyn Hollingshead STANDIFORD --------------------------------------------------------------------------------  Indications: Ulceration. High Risk Factors: Hypertension, hyperlipidemia, Diabetes.  Limitations: Today's exam was limited due to Great toe girth, involuntary              patient movement, an open wound and bandages. Comparison Study: No prior studies. Performing Technologist: Olen Cordial RVT  Examination Guidelines: A complete evaluation includes at minimum, Doppler waveform signals and systolic blood pressure reading at the level of bilateral brachial, anterior tibial, and posterior tibial arteries, when vessel segments are accessible. Bilateral testing is considered an integral part of a complete examination. Photoelectric Plethysmograph (PPG) waveforms and toe systolic pressure readings are included as required and additional duplex testing as needed. Limited examinations for reoccurring indications may be performed as noted.  ABI Findings: +--------+------------------+-----+---------+--------+ Right   Rt Pressure (mmHg)IndexWaveform Comment  +--------+------------------+-----+---------+--------+ [id number]                    triphasic          +--------+------------------+-----+---------+--------+ PTA     219               1.34 triphasic         +--------+------------------+-----+---------+--------+ DP      229               1.40 triphasic         +--------+------------------+-----+---------+--------+ +--------+------------------+-----+-----------+-------+ Left    Lt Pressure (mmHg)IndexWaveform   Comment +--------+------------------+-----+-----------+-------+ [id number]                    triphasic          +--------+------------------+-----+-----------+-------+ PTA     242               1.48 multiphasic        +--------+------------------+-----+-----------+-------+ DP      244               1.50 multiphasic        +--------+------------------+-----+-----------+-------+ +-------+-----------+-----------+------------+------------+ ABI/TBIToday's ABIToday's TBIPrevious ABIPrevious TBI +-------+-----------+-----------+------------+------------+ Right  1.4                                            +-------+-----------+-----------+------------+------------+ Left   1.5                                            +-------+-----------+-----------+------------+------------+  Summary: Right: Resting right ankle-brachial index indicates noncompressible right lower extremity arteries. Unable to obtain TBI due to great toe girth. Left: Resting left ankle-brachial index indicates noncompressible left lower extremity arteries. Unable to obtain TBI due to great toe girth. *See table(s) above for measurements and observations.  Electronically signed by Sherald Hess MD on 10/26/2023 at 4:47:59 PM.    Final    ECHOCARDIOGRAM COMPLETE Result Date: 10/26/2023    ECHOCARDIOGRAM REPORT   Patient Name:   Jeremy [name] Date of Exam: 10/26/2023 Medical Rec #:  [id number]       Height:  77.0 in Accession #:    [id number]      Weight:       240.0 lb Date of Birth:  Jan 26, 1981       BSA:          2.417 m Patient Age:     42 years        BP:           1331/61 mmHg Patient Gender: M               HR:           110 bpm. Exam Location:  Inpatient Procedure: 2D Echo, Cardiac Doppler and Color Doppler (Both Spectral and Color            Flow Doppler were utilized during procedure). Indications:    Dyspnea  History:        Patient has prior history of Echocardiogram examinations, most                 recent 07/18/2022. CHF and Cardiomyopathy; Risk                 Factors:Diabetes.  Sonographer:    Darlys Gales Referring Phys: [id number] JULIE MACHEN IMPRESSIONS  1. Left ventricular ejection fraction, by estimation, is 35 to 40%. The left ventricle has moderately decreased function. The left ventricle demonstrates global hypokinesis. The left ventricular internal cavity size was severely dilated. Left ventricular diastolic function could not be evaluated.  2. Right ventricular systolic function is normal. The right ventricular size is normal. Tricuspid regurgitation signal is inadequate for assessing PA pressure.  3. The mitral valve is normal in structure. Mild mitral valve regurgitation. No evidence of mitral stenosis.  4. Tricuspid valve regurgitation is mild to moderate.  5. The aortic valve is abnormal.     The right and non coronary cusps have focal calcifications that are concerning for possible vegetations. Aortic valve regurgitation is severe. Aortic valve sclerosis/calcification is present, without any evidence of aortic stenosis. Aortic valve mean  gradient measures 5.0 mmHg. Aortic valve Vmax measures 1.46 m/s.  6. The inferior vena cava is normal in size with greater than 50% respiratory variability, suggesting right atrial pressure of 3 mmHg.  7. In the PSAX view at the level of the AV, there is a jet by colorflow perpendicular to the aortic valve into the LVOT. Unclear if this is eccentric AI an eccentric TR in this area or actually a fistula.  8. Recommend TEE for further evaluation of AV leaflets and AR. FINDINGS  Left  Ventricle: Left ventricular ejection fraction, by estimation, is 35 to 40%. The left ventricle has moderately decreased function. The left ventricle demonstrates global hypokinesis. Strain imaging was not performed. The left ventricular internal cavity size was severely dilated. There is no left ventricular hypertrophy. Left ventricular diastolic function could not be evaluated. Right Ventricle: The right ventricular size is normal. No increase in right ventricular wall thickness. Right ventricular systolic function is normal. Tricuspid regurgitation signal is inadequate for assessing PA pressure. Left Atrium: Left atrial size was normal in size. Right Atrium: Right atrial size was normal in size. Pericardium: There is no evidence of pericardial effusion. Mitral Valve: The mitral valve is normal in structure. Mild mitral valve regurgitation. No evidence of mitral valve stenosis. Tricuspid Valve: The tricuspid valve is normal in structure. Tricuspid valve regurgitation is mild to moderate. No evidence of tricuspid stenosis. Aortic Valve: The right and non coronary cusps have focal calcifications. Cannot rule  out vegetations. The aortic valve is abnormal. Aortic valve regurgitation is severe. Aortic regurgitation PHT measures 176 msec. Aortic valve sclerosis/calcification is  present, without any evidence of aortic stenosis. Aortic valve mean gradient measures 5.0 mmHg. Aortic valve peak gradient measures 8.5 mmHg. Pulmonic Valve: The pulmonic valve was normal in structure. Pulmonic valve regurgitation is not visualized. No evidence of pulmonic stenosis. Aorta: The aortic root is normal in size and structure. Venous: The inferior vena cava is normal in size with greater than 50% respiratory variability, suggesting right atrial pressure of 3 mmHg. IAS/Shunts: No atrial level shunt detected by color flow Doppler. Additional Comments: 3D imaging was not performed.  LEFT VENTRICLE PLAX 2D LVIDd:         7.00 cm Diastology  LVIDs:         6.00 cm LV e' lateral:   7.72 cm/s LV PW:         1.20 cm LV E/e' lateral: 14.8 LV IVS:        1.10 cm  RIGHT VENTRICLE RV S prime:     10.00 cm/s TAPSE (M-mode): 1.8 cm LEFT ATRIUM              Index        RIGHT ATRIUM          Index LA Vol (A2C):   122.0 ml 50.48 ml/m  RA Area:     8.91 cm LA Vol (A4C):   63.3 ml  26.19 ml/m  RA Volume:   13.70 ml 5.67 ml/m LA Biplane Vol: 91.0 ml  37.65 ml/m  AORTIC VALVE AV Vmax:           146.00 cm/s AV Vmean:          107.000 cm/s AV VTI:            0.236 m AV Peak Grad:      8.5 mmHg AV Mean Grad:      5.0 mmHg LVOT Vmax:         133.00 cm/s LVOT Vmean:        99.800 cm/s LVOT VTI:          0.228 m LVOT/AV VTI ratio: 0.97 AI PHT:            176 msec MITRAL VALVE MV Area (PHT): 5.42 cm     SHUNTS MV Decel Time: 140 msec     Systemic VTI: 0.23 m MV E velocity: 114.00 cm/s Armanda Magic MD Electronically signed by Armanda Magic MD Signature Date/Time: 10/26/2023/2:12:58 PM    Final     Cardiac Studies      Patient Profile     43 y.o. male    Assessment & Plan    1.  Severe aortic insufficiency: Patient has had severe aortic insufficiency for at least greater than 1 year.  He has issues with volume overload especially after the weekends when he admits to eating more salt and drinking more fluids than he should.  His heart has dilated significantly since last year's echo.  He now has an infected left toe that needs attention-debridement versus amputation . He also have some teeth that apparently need evaluation and possibly extracted.  He is hemodynamically very stable at this point.  He does have moderately reduced LV systolic function.  He had no significant coronary artery disease by coronary CTA in 2023.  He is scheduled for amputation / debridement of his left 2nd toe. He is at low - moderate risk  but  appears to be well tuned up   There is some discussion about whether or not he will be able to have his dental work performed  in the hospital   He may need to go to get his dental work done as OP And then needs to follow up in the cardiologist office (CHF clinic with Dr. Shirlee Latch ) He will need repeat cath and likely CT scan prior to proposed AVR .  Dr. Leafy Ro has reviewed his chart       For questions or updates, please contact [facility] Please consult [email or web address] for contact info under        Signed, Kristeen Miss, MD  10/28/2023, 8:15 AM

## 2023-10-28 NOTE — Progress Notes (Signed)
 Rounding Note    Patient Name: Jeremy [name] Date of Encounter: 10/28/2023  [facility] Cardiologist: Marca Ancona, MD    Subjective   43 year old gentleman with a prior history of aortic valve endocarditis, end-stage renal disease, moderate reduction in LV function who presented yesterday with volume overload.  He has a long history of severe aortic regurgitation.  He was seen by Dr. Maren Beach in 2023 and was instructed to have his teeth evaluated.  He never made the appointment to get his teeth evaluated.  He has a history of becoming very volume overloaded through the weekends.  He admits to eating more salty foods and drinking more fluids than he should during the weekends.  He had dialysis yesterday and has felt much better after getting dialysis.  He is scheduled for debridement/ amputation of his infected toe today .  Had HD early this am    Inpatient Medications    Scheduled Meds:  atorvastatin  80 mg Oral Daily   carvedilol  50 mg Oral BID WC   Chlorhexidine Gluconate Cloth  6 each Topical Q0600   heparin  5,000 Units Subcutaneous Q8H   hydrALAZINE  50 mg Oral TID   insulin aspart  0-15 Units Subcutaneous TID WC   insulin glargine-yfgn  20 Units Subcutaneous QHS   lidocaine  1 patch Transdermal Q24H   LORazepam  0.5 mg Intravenous Once   pantoprazole  40 mg Oral Daily   sevelamer carbonate  2,400 mg Oral TID WC   Continuous Infusions:  PRN Meds: acetaminophen, hydrOXYzine, melatonin, ondansetron   Vital Signs    Vitals:   10/28/23 0600 10/28/23 0630 10/28/23 0640 10/28/23 0710  BP: 132/60 129/68 (!) 145/53 (!) 145/53  Pulse: 76 (!) 103 99 87  Resp: 20 19 18 18   Temp:    97.9 F (36.6 C)  TempSrc:      SpO2: 100% 100% 100% 98%  Weight:    (S) 114 kg  Height:        Intake/Output Summary (Last 24 hours) at 10/28/2023 0815 Last data filed at 10/28/2023 0710 Gross per 24 hour  Intake --  Output 2100 ml  Net -2100 ml      10/28/2023     7:10 AM 10/26/2023    8:30 AM 10/26/2023    3:25 AM  Last 3 Weights  Weight (lbs) 251 lb 5.2 oz  -- 240 lb  Weight (kg) 114 kg  -- 108.863 kg     Significant value      Telemetry    NSR  - Personally Reviewed  ECG    Nsr  - Personally Reviewed  Physical Exam   GEN: NAD , unlabored breathing  Neck: No JVD Cardiac: RRR, 2-3/6 systolic , diastolic murmur at LSB   Respiratory:clear  GI: Soft, nontender, non-distended  MS: left foot wrapped  Neuro:  nonfocal   Psych: Normal affect   Labs    High Sensitivity Troponin:   Recent Labs  Lab 10/12/23 0436 10/26/23 0335 10/26/23 0637 10/26/23 1605 10/27/23 0335  TROPONINIHS 44* 54* 95* 112* 129*     Chemistry Recent Labs  Lab 10/26/23 0446 10/26/23 0456 10/26/23 0637 10/27/23 0335  NA 140 140  --  135  K 4.7 4.6  --  5.0  CL 101 106  --  97*  CO2 21*  --   --  20*  GLUCOSE 184* 180*  --  165*  BUN 94* 84*  --  75*  CREATININE 13.28*  13.60* 13.45* 11.15*  CALCIUM 9.3  --   --  8.6*  PROT 7.8  --   --   --   ALBUMIN 3.1*  --   --  2.6*  AST 18  --   --   --   ALT 15  --   --   --   ALKPHOS 45  --   --   --   BILITOT 0.7  --   --   --   GFRNONAA 4*  --  4* 5*  ANIONGAP 18*  --   --  18*    Lipids  Recent Labs  Lab 10/27/23 0335  CHOL 132  TRIG 103  HDL 28*  LDLCALC 83  CHOLHDL 4.7    Hematology Recent Labs  Lab 10/26/23 0649 10/27/23 0335 10/28/23 0742  WBC 12.8* 10.4 7.6  RBC 4.78 4.24 4.71  HGB 13.3 12.0* 12.9*  HCT 42.0 37.4* 40.6  MCV 87.9 88.2 86.2  MCH 27.8 28.3 27.4  MCHC 31.7 32.1 31.8  RDW 21.3* 20.7* 20.2*  PLT 381 320 344   Thyroid No results for input(s): "TSH", "FREET4" in the last 168 hours.  BNP Recent Labs  Lab 10/26/23 0335  BNP 859.3*    DDimer No results for input(s): "DDIMER" in the last 168 hours.   Radiology    MR FOOT LEFT WO CONTRAST Result Date: 10/27/2023 CLINICAL DATA:  Clinical concern for left foot osteomyelitis. 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 EXAM: MRI OF THE LEFT FOOT WITHOUT CONTRAST TECHNIQUE: Multiplanar, multisequence MR imaging of the left forefoot was performed. No intravenous contrast was administered. COMPARISON:  Radiographs 10/20/2023 and 09/03/2023.  MRI 09/04/2023. FINDINGS: Bones/Joint/Cartilage There are postsurgical changes from previous amputation of the 3rd ray through the distal 3rd metatarsal and amputation through the base of the 4th proximal phalanx. Apparent sinus tract extending dorsally from the remaining 3rd metatarsal, best seen on the short axis axial images. There is a small fluid collection adjacent to the remaining 3rd metatarsal with heterotopic ossification and prominent marrow edema extending proximally throughout the 3rd metatarsal shaft. Nonspecific low-level T2 hyperintensity within the heads of the 2nd and 4th metatarsals without cortical destruction or abnormal T1 signal. Nonspecific mild T2 hyperintensity within the remaining 4th proximal phalanx. No significant abnormalities within the 1st or 5th rays. No significant midfoot arthropathy or effusion. Ligaments Intact Lisfranc ligament. The collateral ligaments of the remaining metatarsophalangeal joints are intact. Muscles and Tendons Mild muscular atrophy. Postsurgical changes related to previous amputations. No significant tenosynovitis. Soft tissues Postsurgical changes related to previous amputations with apparent sinus tract extending dorsally from the remaining 3rd metatarsal. Subcentimeter fluid collection adjacent to the remaining 3rd metatarsal with surrounding inflammatory changes. No drainable fluid collections are identified. IMPRESSION: 1. 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 Apparent sinus tract extending dorsally from the remaining 3rd metatarsal with a small fluid collection adjacent to the remaining 3rd metatarsal and prominent marrow edema  extending proximally throughout the 3rd metatarsal shaft. 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 2. Nonspecific low-level T2 hyperintensity within the heads of the 2nd and 4th metatarsals without cortical destruction or abnormal T1 signal. 3. No drainable fluid collections identified. Electronically Signed   By: Carey Bullocks M.D.   On: 10/27/2023 10:30   VAS [place] ABI WITH/WO TBI Result Date: 10/26/2023  LOWER EXTREMITY DOPPLER STUDY Patient Name:  LONELL STAMOS  Date of Exam:   10/26/2023 Medical  Rec #: [id number]        Accession #:    [id number] Date of Birth: Dec 07, 1980        Patient Gender: M Patient Age:   31 years Exam Location:  [facility] Procedure:      VAS [place] ABI WITH/WO TBI Referring Phys: Lyn Hollingshead STANDIFORD --------------------------------------------------------------------------------  Indications: Ulceration. High Risk Factors: Hypertension, hyperlipidemia, Diabetes.  Limitations: Today's exam was limited due to Great toe girth, involuntary              patient movement, an open wound and bandages. Comparison Study: No prior studies. Performing Technologist: Olen Cordial RVT  Examination Guidelines: A complete evaluation includes at minimum, Doppler waveform signals and systolic blood pressure reading at the level of bilateral brachial, anterior tibial, and posterior tibial arteries, when vessel segments are accessible. Bilateral testing is considered an integral part of a complete examination. Photoelectric Plethysmograph (PPG) waveforms and toe systolic pressure readings are included as required and additional duplex testing as needed. Limited examinations for reoccurring indications may be performed as noted.  ABI Findings: +--------+------------------+-----+---------+--------+ Right   Rt Pressure (mmHg)IndexWaveform Comment  +--------+------------------+-----+---------+--------+ [id number]                    triphasic          +--------+------------------+-----+---------+--------+ PTA     219               1.34 triphasic         +--------+------------------+-----+---------+--------+ DP      229               1.40 triphasic         +--------+------------------+-----+---------+--------+ +--------+------------------+-----+-----------+-------+ Left    Lt Pressure (mmHg)IndexWaveform   Comment +--------+------------------+-----+-----------+-------+ [id number]                    triphasic          +--------+------------------+-----+-----------+-------+ PTA     242               1.48 multiphasic        +--------+------------------+-----+-----------+-------+ DP      244               1.50 multiphasic        +--------+------------------+-----+-----------+-------+ +-------+-----------+-----------+------------+------------+ ABI/TBIToday's ABIToday's TBIPrevious ABIPrevious TBI +-------+-----------+-----------+------------+------------+ Right  1.4                                            +-------+-----------+-----------+------------+------------+ Left   1.5                                            +-------+-----------+-----------+------------+------------+  Summary: Right: Resting right ankle-brachial index indicates noncompressible right lower extremity arteries. Unable to obtain TBI due to great toe girth. Left: Resting left ankle-brachial index indicates noncompressible left lower extremity arteries. Unable to obtain TBI due to great toe girth. *See table(s) above for measurements and observations.  Electronically signed by Sherald Hess MD on 10/26/2023 at 4:47:59 PM.    Final    ECHOCARDIOGRAM COMPLETE Result Date: 10/26/2023    ECHOCARDIOGRAM REPORT   Patient Name:   RAMAR NOBREGA Date of Exam: 10/26/2023 Medical Rec #:  [id number]       Height:  77.0 in Accession #:    [id number]      Weight:       240.0 lb Date of Birth:  Jan 26, 1981       BSA:          2.417 m Patient Age:     42 years        BP:           1331/61 mmHg Patient Gender: M               HR:           110 bpm. Exam Location:  Inpatient Procedure: 2D Echo, Cardiac Doppler and Color Doppler (Both Spectral and Color            Flow Doppler were utilized during procedure). Indications:    Dyspnea  History:        Patient has prior history of Echocardiogram examinations, most                 recent 07/18/2022. CHF and Cardiomyopathy; Risk                 Factors:Diabetes.  Sonographer:    Darlys Gales Referring Phys: [id number] JULIE MACHEN IMPRESSIONS  1. Left ventricular ejection fraction, by estimation, is 35 to 40%. The left ventricle has moderately decreased function. The left ventricle demonstrates global hypokinesis. The left ventricular internal cavity size was severely dilated. Left ventricular diastolic function could not be evaluated.  2. Right ventricular systolic function is normal. The right ventricular size is normal. Tricuspid regurgitation signal is inadequate for assessing PA pressure.  3. The mitral valve is normal in structure. Mild mitral valve regurgitation. No evidence of mitral stenosis.  4. Tricuspid valve regurgitation is mild to moderate.  5. The aortic valve is abnormal.     The right and non coronary cusps have focal calcifications that are concerning for possible vegetations. Aortic valve regurgitation is severe. Aortic valve sclerosis/calcification is present, without any evidence of aortic stenosis. Aortic valve mean  gradient measures 5.0 mmHg. Aortic valve Vmax measures 1.46 m/s.  6. The inferior vena cava is normal in size with greater than 50% respiratory variability, suggesting right atrial pressure of 3 mmHg.  7. In the PSAX view at the level of the AV, there is a jet by colorflow perpendicular to the aortic valve into the LVOT. Unclear if this is eccentric AI an eccentric TR in this area or actually a fistula.  8. Recommend TEE for further evaluation of AV leaflets and AR. FINDINGS  Left  Ventricle: Left ventricular ejection fraction, by estimation, is 35 to 40%. The left ventricle has moderately decreased function. The left ventricle demonstrates global hypokinesis. Strain imaging was not performed. The left ventricular internal cavity size was severely dilated. There is no left ventricular hypertrophy. Left ventricular diastolic function could not be evaluated. Right Ventricle: The right ventricular size is normal. No increase in right ventricular wall thickness. Right ventricular systolic function is normal. Tricuspid regurgitation signal is inadequate for assessing PA pressure. Left Atrium: Left atrial size was normal in size. Right Atrium: Right atrial size was normal in size. Pericardium: There is no evidence of pericardial effusion. Mitral Valve: The mitral valve is normal in structure. Mild mitral valve regurgitation. No evidence of mitral valve stenosis. Tricuspid Valve: The tricuspid valve is normal in structure. Tricuspid valve regurgitation is mild to moderate. No evidence of tricuspid stenosis. Aortic Valve: The right and non coronary cusps have focal calcifications. Cannot rule  out vegetations. The aortic valve is abnormal. Aortic valve regurgitation is severe. Aortic regurgitation PHT measures 176 msec. Aortic valve sclerosis/calcification is  present, without any evidence of aortic stenosis. Aortic valve mean gradient measures 5.0 mmHg. Aortic valve peak gradient measures 8.5 mmHg. Pulmonic Valve: The pulmonic valve was normal in structure. Pulmonic valve regurgitation is not visualized. No evidence of pulmonic stenosis. Aorta: The aortic root is normal in size and structure. Venous: The inferior vena cava is normal in size with greater than 50% respiratory variability, suggesting right atrial pressure of 3 mmHg. IAS/Shunts: No atrial level shunt detected by color flow Doppler. Additional Comments: 3D imaging was not performed.  LEFT VENTRICLE PLAX 2D LVIDd:         7.00 cm Diastology  LVIDs:         6.00 cm LV e' lateral:   7.72 cm/s LV PW:         1.20 cm LV E/e' lateral: 14.8 LV IVS:        1.10 cm  RIGHT VENTRICLE RV S prime:     10.00 cm/s TAPSE (M-mode): 1.8 cm LEFT ATRIUM              Index        RIGHT ATRIUM          Index LA Vol (A2C):   122.0 ml 50.48 ml/m  RA Area:     8.91 cm LA Vol (A4C):   63.3 ml  26.19 ml/m  RA Volume:   13.70 ml 5.67 ml/m LA Biplane Vol: 91.0 ml  37.65 ml/m  AORTIC VALVE AV Vmax:           146.00 cm/s AV Vmean:          107.000 cm/s AV VTI:            0.236 m AV Peak Grad:      8.5 mmHg AV Mean Grad:      5.0 mmHg LVOT Vmax:         133.00 cm/s LVOT Vmean:        99.800 cm/s LVOT VTI:          0.228 m LVOT/AV VTI ratio: 0.97 AI PHT:            176 msec MITRAL VALVE MV Area (PHT): 5.42 cm     SHUNTS MV Decel Time: 140 msec     Systemic VTI: 0.23 m MV E velocity: 114.00 cm/s Armanda Magic MD Electronically signed by Armanda Magic MD Signature Date/Time: 10/26/2023/2:12:58 PM    Final     Cardiac Studies      Patient Profile     43 y.o. male    Assessment & Plan    1.  Severe aortic insufficiency: Patient has had severe aortic insufficiency for at least greater than 1 year.  He has issues with volume overload especially after the weekends when he admits to eating more salt and drinking more fluids than he should.  His heart has dilated significantly since last year's echo.  He now has an infected left toe that needs attention-debridement versus amputation . He also have some teeth that apparently need evaluation and possibly extracted.  He is hemodynamically very stable at this point.  He does have moderately reduced LV systolic function.  He had no significant coronary artery disease by coronary CTA in 2023.  He is scheduled for amputation / debridement of his left 2nd toe. He is at low - moderate risk  but  appears to be well tuned up   There is some discussion about whether or not he will be able to have his dental work performed  in the hospital   He may need to go to get his dental work done as OP And then needs to follow up in the cardiologist office (CHF clinic with Dr. Shirlee Latch ) He will need repeat cath and likely CT scan prior to proposed AVR .  Dr. Leafy Ro has reviewed his chart       For questions or updates, please contact [facility] Please consult [email or web address] for contact info under        Signed, Kristeen Miss, MD  10/28/2023, 8:15 AM

## 2023-10-28 NOTE — TOC CM/SW Note (Signed)
 Transition of Care [facility]) - Inpatient Brief Assessment   Patient Details  Name: Jeremy [name] MRN: [id number] Date of Birth: [birth year]  [facility]) CM/SW Contact:    Harriet Masson, RN Phone Number: 10/28/2023, 1:40 PM   Clinical Narrative: Patient is in surgery today for Left foot irrigation and debridement, resection 3rd metatarsal, antibiotic beads, wound vac. Patient has used [facility] home [facility].  TOC following for needs.   Transition of Care Asessment: Insurance and Status: Insurance coverage has been reviewed Patient has primary care physician: Yes Home environment has been reviewed: safe to discharge home Prior level of function:: independent Prior/Current Home Services: Current home services ([facility]) Social Drivers of Health Review: SDOH reviewed no interventions necessary Readmission risk has been reviewed: Yes Transition of care needs: transition of care needs identified, TOC will continue to follow

## 2023-10-28 NOTE — Plan of Care (Signed)

## 2023-10-28 NOTE — Progress Notes (Incomplete)
 Vanc/Unasyn s/p surgery

## 2023-10-28 NOTE — Progress Notes (Signed)
 Patients Wound Vac not working due to a leak. RN tried to reseal the leak but had no success. Notified Physician Carlena Hurl and got orders to remove Wound Vac and place a Wet to Dry dressing. Unable to removed black sponge from wound due to resistance. Placed Wet to Dry dressing. Changed twice. Got orders to elevate extremity. Passed report to Night shift RN.

## 2023-10-28 NOTE — Progress Notes (Signed)
 HD#2 SUBJECTIVE:  Patient Summary: Jeremy [name] is a 43 y.o. with a pertinent PMH of ESRD on dialysis Monday, Wednesday, Friday, type 2 diabetes mellitus, HFpEF, history of diabetic foot infection who presents to the emergency department concerns of shortness of breath .   Overnight Events: NAEO   Interim History: Patient was evaluated at bedside.  Denies any chest pain, troubles breathing, fevers, chills, night sweats, or any other signs or symptoms.  He did endorse some back pain likely from his bed.  Patient expressed discontent with his hospitalization but was understanding of plan and has no other concerns at this time.  OBJECTIVE:  Vital Signs: Vitals:   10/28/23 0630 10/28/23 0640 10/28/23 0710 10/28/23 0837  BP: 129/68 (!) 145/53 (!) 145/53 (!) 96/52  Pulse: (!) 103 99 87   Resp: 19 18 18  (!) 24  Temp:   97.9 F (36.6 C) 98.2 F (36.8 C)  TempSrc:    Oral  SpO2: 100% 100% 98%   Weight:   (S) 114 kg   Height:       Supplemental O2: Room Air  Filed Weights   10/26/23 0325 10/28/23 0710  Weight: 108.9 kg (S) 114 kg     Intake/Output Summary (Last 24 hours) at 10/28/2023 1001 Last data filed at 10/28/2023 0710 Gross per 24 hour  Intake --  Output 2100 ml  Net -2100 ml   Net IO Since Admission: -5,093.1 mL [10/28/23 1001]  CBC    Component Value Date/Time   WBC 7.6 10/28/2023 0742   RBC 4.71 10/28/2023 0742   HGB 12.9 (L) 10/28/2023 0742   HCT 40.6 10/28/2023 0742   HCT 23.7 (L) 05/30/2016 1157   PLT 344 10/28/2023 0742   MCV 86.2 10/28/2023 0742   MCH 27.4 10/28/2023 0742   MCHC 31.8 10/28/2023 0742   RDW 20.2 (H) 10/28/2023 0742   LYMPHSABS 2.0 10/28/2023 0742   MONOABS 0.8 10/28/2023 0742   EOSABS 0.3 10/28/2023 0742   BASOSABS 0.1 10/28/2023 0742   CMP     Component Value Date/Time   NA 133 (L) 10/28/2023 0742   NA 141 10/15/2020 1015   K 3.5 10/28/2023 0742   CL 92 (L) 10/28/2023 0742   CO2 25 10/28/2023 0742   GLUCOSE 133 (H) 10/28/2023  0742   BUN 43 (H) 10/28/2023 0742   BUN 62 (H) 10/15/2020 1015   CREATININE 7.41 (H) 10/28/2023 0742   CALCIUM 9.2 10/28/2023 0742   PROT 7.8 10/26/2023 0446   ALBUMIN 3.1 (L) 10/28/2023 0742   AST 18 10/26/2023 0446   ALT 15 10/26/2023 0446   ALKPHOS 45 10/26/2023 0446   BILITOT 0.7 10/26/2023 0446   GFR 109.39 03/09/2015 1145   GFRNONAA 9 (L) 10/28/2023 0742   Physical Exam: Physical Exam Constitutional:      Appearance: He is well-developed.  HENT:     Head: Normocephalic and atraumatic.  Cardiovascular:     Rate and Rhythm: Normal rate and regular rhythm.  Pulmonary:     Effort: Pulmonary effort is normal.     Breath sounds: Normal breath sounds.  Abdominal:     General: Bowel sounds are normal.     Palpations: Abdomen is soft.  Musculoskeletal:     Comments: 4th toe amputation on R. L foot in clean bandaged wrapping  Neurological:     General: No focal deficit present.     Mental Status: He is alert.    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 with a pertinent PMH of ESRD on dialysis Monday, Wednesday, Friday, type 2 diabetes mellitus, HFpEF, history of diabetic foot infection who presents to the emergency department concerns of shortness of breath .   Plan: #Acute hypoxemic respiratory failure #Aortic insufficiency #Flash pulmonary edema, improving #HFpEF exacerbation Stable this morning.  Prior to aortic valve replacement, patient will need dental extraction and resolution of infection/source control. Attempted to contact dentist yesterday for possible inpatient dental extraction  but unable to get a hold of.  After discussion with patient this morning, patient would prefer to reach out and follow-up outpatient.  He was provided with the information and counseled to call the office. Patient is also scheduled for a TEE tomorrow and will be n.p.o. at midnight.  Will also likely need repeat cath and CT scan prior to aortic valve replacement. - Follow-up cardiology recs - Plan for TEE tomorrow - Ensure patient schedules outpatient dental appointment  #Concern for osteomyelitis of third metatarsal Followed by podiatry and infectious disease. Blood cultures remain negative. Initially, there was concern that patient may be a poor surgical candidate given his worsening aortic insufficiency.  Discussed with cardiology who feels he had is at a moderately increased risk for sedation for left toe debridement/amputation and dental extractions but should tolerate sedation and surgical procedures.  Plan for OR today for debridement but would likely need follow-up and long-term antibiotic treatment for complete source control. - Follow-up ID recs - Follow-up podiatry recs - Follow-up blood cultures - Will get repeat tissue cultures sent to aerobic, fungal cultures, and to pathology to ensure clear margins - Start vancomycin and Unasyn postoperatively today  #Hypertension, resolved  Patient had episode of hypotension this morning with blood pressure of 96/52.  Morning Coreg was held.  Will continue current regimen and continue to monitor. - Continue Coreg 50 mg twice daily, hydralazine 50 mg 3 times daily  #ESRD on dialysis Monday/Wednesday/Friday Tolerated dialysis well this morning.  Continue home Lokelma and Renvela.   #Type 2 diabetes mellitus Stable.  Continue SSI and Lantus 20 units daily.  #GERD Continue Protonix 40 mg daily  Best Practice: Diet: N.p.o. at midnight IVF: None VTE: heparin injection 5,000 Units Start: 10/26/23 1400 Code: Full AB: Will start Unasyn plus  Vanco stop Therapy Recs: TBD Family Contact: Sister, to be notified. DISPO: Anticipated discharge  TBD  to Home pending  toe procedures and antibiotic regimen .  Signature: Morrie Sheldon, MD Internal Medicine Resident, PGY-1 [facility]  Pager: [phone]  Please contact the on call pager after 5 pm and on weekends at [phone].

## 2023-10-28 NOTE — Progress Notes (Signed)
 Pharmacy Antibiotic Note  Jeremy [name] is a 43 y.o. male admitted on 10/26/2023 with  recurrent osteomyelitis following recent toe amputation . Prior to admission completed course of ciprofloxacin. Now undergoing amputation 2/19, plan for tooth extraction 2/21. Pharmacy has been consulted for vancomycin/unasyn dosing.   Afeb, wbc 10.4, and last HD 2/19 AM  Plan: Resume vancomycin 1000 mg IV post-HD (MWF) - needs dose post-op Unasyn 3 g IV q12h Cards w/u - AVR, dental extraction, TEE F/u margins, culture data, duration of therapy  Height: 6\' 5"  (195.6 cm) Weight: 108.9 kg (240 lb) IBW/kg (Calculated) : 89.1  Temp (24hrs), Avg:98.1 F (36.7 C), Min:97.8 F (36.6 C), Max:98.9 F (37.2 C)  Recent Labs  Lab 10/26/23 0335 10/26/23 0446 10/26/23 0456 10/26/23 0637 10/26/23 0649 10/26/23 1605 10/27/23 0335 10/28/23 0742 10/28/23 1228  WBC 15.1*  --   --   --  12.8*  --  10.4 7.6  --   CREATININE  --    < > 13.60* 13.45*  --   --  11.15* 7.41* 10.00*  LATICACIDVEN  --   --   --   --  2.1* 1.8  --   --   --    < > = values in this interval not displayed.    Estimated Creatinine Clearance: 13.1 mL/min (A) (by C-G formula based on SCr of 10 mg/dL (H)).    Allergies  Allergen Reactions   Acyclovir And Related Hives   Imdur [Isosorbide Nitrate]     Headaches from nitrate    Antimicrobials this admission: Unasyn 2/19 >> Vanco 2/17 >> 2/18; 2/19 >> Cefepime 2/17 >> 2/18  Microbiology results: 12/28 Bone/tissue : proteus and bacteroides 2/19 Tissue: 2/18 Bcx: no growth <24h 2/18 MRSA PCR: negative  Thank you for allowing pharmacy to be a part of this patient's care.  Knute Neu 10/28/2023 1:39 PM

## 2023-10-28 NOTE — Progress Notes (Signed)
   10/28/23 0710  Vitals  Temp 97.9 F (36.6 C)  Pulse Rate 87  Resp 18  BP (!) 145/53  SpO2 98 %  O2 Device Room Air  Weight (S)  114 kg (Bed Scale)  Type of Weight Post-Dialysis  Oxygen Therapy  Patient Activity (if Appropriate) In bed  Pulse Oximetry Type Continuous  Post Treatment  Dialyzer Clearance Clear  Hemodialysis Intake (mL) 0 mL  Liters Processed 96  Fluid Removed (mL) 2100 mL  Tolerated HD Treatment Yes  Post-Hemodialysis Comments Pt. tolerated HD without difficulties and voice no complaints. UF goal met and Admin medication. Report call to [facility]   Received patient in bed to unit.  Alert and oriented.  Informed consent signed and in chart.   TX duration:4  Patient tolerated well.  Transported back to the room  Alert, without acute distress.  Hand-off given to patient's nurse.   Access used: Yes Access issues: No  Total UF removed: 2100 Medication(s) given: See MAR Post HD VS: See Above Grid Post HD weight: 114 kg   [facility]

## 2023-10-28 NOTE — Progress Notes (Signed)
 Pharmacist Heart Failure Core Measure Documentation  Assessment: Jeremy [name] has an EF documented as 35-40% on 10/26/23 by Echo.  Rationale: Heart failure patients with left ventricular systolic dysfunction (LVSD) and an EF < 40% should be prescribed an angiotensin converting enzyme inhibitor (ACEI) or angiotensin receptor blocker (ARB) at discharge unless a contraindication is documented in the medical record.  This patient is not currently on an ACEI or ARB for HF.  This note is being placed in the record in order to provide documentation that a contraindication to the use of these agents is present for this encounter.  ACE Inhibitor or Angiotensin Receptor Blocker is contraindicated (specify all that apply)  []   ACEI allergy AND ARB allergy []   Angioedema []   Moderate or severe aortic stenosis []   Hyperkalemia []   Hypotension []   Renal artery stenosis [x]   Worsening renal function, preexisting renal disease or dysfunction  Harland German, PharmD Clinical Pharmacist **Pharmacist phone directory can now be found on [email or web address] ([email or web address]).  Listed under [facility].

## 2023-10-28 NOTE — Anesthesia Preprocedure Evaluation (Signed)
 Anesthesia Evaluation  Patient identified by MRN, date of birth, ID band Patient awake    Reviewed: Allergy & Precautions, NPO status , Patient's Chart, lab work & pertinent test results  History of Anesthesia Complications (+) PONV and history of anesthetic complications  Airway Mallampati: III  TM Distance: >3 FB Neck ROM: Full    Dental  (+) Dental Advisory Given   Pulmonary shortness of breath, asthma , neg sleep apnea, neg recent URI   breath sounds clear to auscultation       Cardiovascular hypertension, Pt. on medications and Pt. on home beta blockers (-) angina +CHF   Rhythm:Regular   1. Left ventricular ejection fraction, by estimation, is 35 to 40%. The  left ventricle has moderately decreased function. The left ventricle  demonstrates global hypokinesis. The left ventricular internal cavity size  was severely dilated. Left  ventricular diastolic function could not be evaluated.   2. Right ventricular systolic function is normal. The right ventricular  size is normal. Tricuspid regurgitation signal is inadequate for assessing  PA pressure.   3. The mitral valve is normal in structure. Mild mitral valve  regurgitation. No evidence of mitral stenosis.   4. Tricuspid valve regurgitation is mild to moderate.   5. The aortic valve is abnormal.      The right and non coronary cusps have focal calcifications that are  concerning for possible vegetations. Aortic valve regurgitation is severe.  Aortic valve sclerosis/calcification is present, without any evidence of  aortic stenosis. Aortic valve mean   gradient measures 5.0 mmHg. Aortic valve Vmax measures 1.46 m/s.   6. The inferior vena cava is normal in size with greater than 50%  respiratory variability, suggesting right atrial pressure of 3 mmHg.   7. In the PSAX view at the level of the AV, there is a jet by colorflow  perpendicular to the aortic valve into the LVOT.  Unclear if this is  eccentric AI an eccentric TR in this area or actually a fistula.   8. Recommend TEE for further evaluation of AV leaflets and AR.     Neuro/Psych  PSYCHIATRIC DISORDERS  Depression       GI/Hepatic Neg liver ROS,GERD  Controlled and Medicated,,  Endo/Other  diabetes, Type 2, Insulin Dependent    Renal/GU ESRF and DialysisRenal diseaseHd today  Lab Results      Component                Value               Date                      NA                       135                 10/28/2023                K                        4.0                 10/28/2023                CO2                      25  10/28/2023                GLUCOSE                  109 (H)             10/28/2023                BUN                      44 (H)              10/28/2023                CREATININE               10.00 (H)           10/28/2023                CALCIUM                  9.2                 10/28/2023                GFR                      109.39              03/09/2015                GFRNONAA                 9 (L)               10/28/2023                Musculoskeletal negative musculoskeletal ROS (+)    Abdominal   Peds  Hematology Lab Results      Component                Value               Date                      WBC                      7.6                 10/28/2023                HGB                      13.9                10/28/2023                HCT                      41.0                10/28/2023                MCV                      86.2                10/28/2023  PLT                      344                 10/28/2023              Anesthesia Other Findings All: Acyclovir, imdur  Reproductive/Obstetrics                             Anesthesia Physical Anesthesia Plan  ASA: 3  Anesthesia Plan: MAC   Post-op Pain Management:    Induction: Intravenous  PONV Risk Score and Plan: 2 and  Propofol infusion  Airway Management Planned: Nasal Cannula, Natural Airway and Simple Face Mask  Additional Equipment: None  Intra-op Plan:   Post-operative Plan:   Informed Consent: I have reviewed the patients History and Physical, chart, labs and discussed the procedure including the risks, benefits and alternatives for the proposed anesthesia with the patient or authorized representative who has indicated his/her understanding and acceptance.     Dental advisory given  Plan Discussed with: CRNA  Anesthesia Plan Comments:         Anesthesia Quick Evaluation

## 2023-10-28 NOTE — Transfer of Care (Signed)
 Immediate Anesthesia Transfer of Care Note  Patient: Jeremy [name]  Procedure(s) Performed: AMPUTATION RAY FOOT (Left: Foot)  Patient Location: PACU  Anesthesia Type:MAC  Level of Consciousness: awake, alert , and oriented  Airway & Oxygen Therapy: Patient Spontanous Breathing and Patient connected to nasal cannula oxygen  Post-op Assessment: Report given to RN and Post -op Vital signs reviewed and stable  Post vital signs: Reviewed and stable  Last Vitals:  Vitals Value Taken Time  BP 99/50 10/28/23 1449  Temp    Pulse 97 10/28/23 1450  Resp 33 10/28/23 1450  SpO2 92 % 10/28/23 1450  Vitals shown include unfiled device data.  Last Pain:  Vitals:   10/28/23 1226  TempSrc:   PainSc: 0-No pain         Complications: No notable events documented.

## 2023-10-28 NOTE — Anesthesia Preprocedure Evaluation (Signed)
 Anesthesia Evaluation  Patient identified by MRN, date of birth, ID band Patient awake    Reviewed: Allergy & Precautions, NPO status , Patient's Chart, lab work & pertinent test results  History of Anesthesia Complications (+) PONV and history of anesthetic complications  Airway Mallampati: III  TM Distance: >3 FB Neck ROM: Full    Dental  (+) Dental Advisory Given   Pulmonary shortness of breath, asthma , neg sleep apnea, neg recent URI   breath sounds clear to auscultation       Cardiovascular hypertension, Pt. on medications and Pt. on home beta blockers (-) angina +CHF   Rhythm:Regular   1. Left ventricular ejection fraction, by estimation, is 35 to 40%. The  left ventricle has moderately decreased function. The left ventricle  demonstrates global hypokinesis. The left ventricular internal cavity size  was severely dilated. Left  ventricular diastolic function could not be evaluated.   2. Right ventricular systolic function is normal. The right ventricular  size is normal. Tricuspid regurgitation signal is inadequate for assessing  PA pressure.   3. The mitral valve is normal in structure. Mild mitral valve  regurgitation. No evidence of mitral stenosis.   4. Tricuspid valve regurgitation is mild to moderate.   5. The aortic valve is abnormal.      The right and non coronary cusps have focal calcifications that are  concerning for possible vegetations. Aortic valve regurgitation is severe.  Aortic valve sclerosis/calcification is present, without any evidence of  aortic stenosis. Aortic valve mean   gradient measures 5.0 mmHg. Aortic valve Vmax measures 1.46 m/s.   6. The inferior vena cava is normal in size with greater than 50%  respiratory variability, suggesting right atrial pressure of 3 mmHg.   7. In the PSAX view at the level of the AV, there is a jet by colorflow  perpendicular to the aortic valve into the LVOT.  Unclear if this is  eccentric AI an eccentric TR in this area or actually a fistula.   8. Recommend TEE for further evaluation of AV leaflets and AR.     Neuro/Psych  PSYCHIATRIC DISORDERS  Depression       GI/Hepatic Neg liver ROS,GERD  Controlled and Medicated,,  Endo/Other  diabetes, Type 2, Insulin Dependent    Renal/GU ESRF and DialysisRenal diseaseHd today  Lab Results      Component                Value               Date                      NA                       135                 10/28/2023                K                        4.0                 10/28/2023                CO2                      25  10/28/2023                GLUCOSE                  109 (H)             10/28/2023                BUN                      44 (H)              10/28/2023                CREATININE               10.00 (H)           10/28/2023                CALCIUM                  9.2                 10/28/2023                GFR                      109.39              03/09/2015                GFRNONAA                 9 (L)               10/28/2023                Musculoskeletal negative musculoskeletal ROS (+)    Abdominal   Peds  Hematology Lab Results      Component                Value               Date                      WBC                      7.6                 10/28/2023                HGB                      13.9                10/28/2023                HCT                      41.0                10/28/2023                MCV                      86.2                10/28/2023  PLT                      344                 10/28/2023              Anesthesia Other Findings   Reproductive/Obstetrics                              Anesthesia Physical Anesthesia Plan  ASA: 3  Anesthesia Plan: MAC   Post-op Pain Management:    Induction: Intravenous  PONV Risk Score and Plan: 2 and Propofol  infusion  Airway Management Planned: Nasal Cannula, Natural Airway and Simple Face Mask  Additional Equipment: None  Intra-op Plan:   Post-operative Plan:   Informed Consent: I have reviewed the patients History and Physical, chart, labs and discussed the procedure including the risks, benefits and alternatives for the proposed anesthesia with the patient or authorized representative who has indicated his/her understanding and acceptance.     Dental advisory given  Plan Discussed with: CRNA  Anesthesia Plan Comments:          Anesthesia Quick Evaluation

## 2023-10-28 NOTE — Progress Notes (Signed)
 History and Physical Interval Note:  10/28/2023 12:46 PM  Jeremy [name]  has presented today for surgery, with the diagnosis of osteomyelitis and wound of left foot.  The various methods of treatment have been discussed with the patient and family. After consideration of risks, benefits and other options for treatment, the patient has consented to   Procedure(s) with comments: AMPUTATION RAY FOOT (Left) - Partial 3rd ray resection, I&D, antibiotic beads, wound vac as a surgical intervention.  The patient's history has been reviewed, patient examined, no change in status, stable for surgery.  I have reviewed the patient's chart and labs.  Questions were answered to the patient's satisfaction.     Jenelle Mages Viera Okonski

## 2023-10-28 NOTE — Progress Notes (Signed)
 [facility]    Date of Admission:  10/26/2023   Total days of antibiotics 2          ID: Jeremy [name] is a 43 y.o. 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 Would like to go home despite heading to the OR this afternoon  Medications:   [MAR Hold] atorvastatin  80 mg Oral Daily   [MAR Hold] carvedilol  50 mg Oral BID WC   [MAR Hold] Chlorhexidine Gluconate Cloth  6 each Topical Q0600   [MAR Hold] heparin  5,000 Units Subcutaneous Q8H   [MAR Hold] hydrALAZINE  50 mg Oral TID   [MAR Hold] insulin aspart  0-15 Units Subcutaneous TID WC   [MAR Hold] insulin glargine-yfgn  20 Units Subcutaneous QHS   [MAR Hold] lidocaine  1 patch Transdermal Q24H   [MAR Hold] LORazepam  0.5 mg Intravenous Once   [MAR Hold] pantoprazole  40 mg Oral Daily   [MAR Hold] sevelamer carbonate  2,400 mg Oral TID WC    Objective: Vital signs in last 24 hours: Temp:  [97.8 F (36.6 C)-98.9 F (37.2 C)] 98.9 F (37.2 C) (02/19 1223) Pulse Rate:  [62-103] 97 (02/19 1223) Resp:  [17-24] 18 (02/19 1223) BP: (96-145)/(40-82) 111/67 (02/19 1223) SpO2:  [96 %-100 %] 96 % (02/19 1223) Weight:  [108.9 kg-114 kg] 108.9 kg (02/19 1223) Physical Exam  Constitutional: He is oriented to person, place, and time. He appears well-developed and well-nourished. No distress.  HENT:  Mouth/Throat: Oropharynx is clear and moist. No oropharyngeal exudate.  Cardiovascular: Normal rate, regular rhythm and normal heart sounds. Exam reveals no gallop and no friction rub.  No murmur heard.  Pulmonary/Chest:  Effort normal and breath sounds normal. No respiratory distress. He has no wheezes.  Abdominal: Soft. Bowel sounds are normal. He exhibits no distension. There is no tenderness.  Lymphadenopathy:  He has no cervical adenopathy.  Neurological: He is alert and oriented to person, place, and time.  Skin: Skin is warm and dry. No rash noted. No erythema.  Psychiatric: He has a normal mood and affect. His behavior is normal.    Lab Results Recent Labs    10/27/23 0335 10/28/23 0742 10/28/23 1228  WBC 10.4 7.6  --   HGB 12.0* 12.9* 13.9  HCT 37.4* 40.6 41.0  NA 135 133* 135  K 5.0 3.5 4.0  CL 97* 92* 98  CO2 20* 25  --   BUN 75* 43* 44*  CREATININE 11.15* 7.41* 10.00*   Liver Panel Recent Labs    10/26/23 0446 10/27/23 0335 10/28/23 0742  PROT 7.8  --   --   ALBUMIN 3.1* 2.6* 3.1*  AST 18  --   --   ALT 15  --   --   ALKPHOS 45  --   --   BILITOT 0.7  --   --    Sedimentation Rate No results for input(s): "ESRSEDRATE" in the last 72 hours. C-Reactive Protein No results for input(s): "CRP" in the last 72 hours.  Microbiology:  Blood cx ngtd Studies/Results: MR FOOT LEFT WO CONTRAST Result Date: 10/27/2023 CLINICAL DATA:  Clinical concern for left foot osteomyelitis. 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 EXAM: MRI OF THE LEFT FOOT WITHOUT CONTRAST TECHNIQUE: Multiplanar, multisequence MR imaging of the left forefoot was performed. No intravenous contrast was administered. COMPARISON:  Radiographs 10/20/2023 and 09/03/2023.  MRI 09/04/2023. FINDINGS: Bones/Joint/Cartilage There are postsurgical changes from previous amputation of the 3rd ray through the distal 3rd metatarsal and amputation through the base of the 4th proximal phalanx. Apparent sinus tract extending dorsally from the remaining 3rd metatarsal, best seen on the short axis axial images. There is a small fluid collection adjacent to the remaining 3rd metatarsal with heterotopic ossification and prominent marrow edema  extending proximally throughout the 3rd metatarsal shaft. Nonspecific low-level T2 hyperintensity within the heads of the 2nd and 4th metatarsals without cortical destruction or abnormal T1 signal. Nonspecific mild T2 hyperintensity within the remaining 4th proximal phalanx. No significant abnormalities within the 1st or 5th rays. No significant midfoot arthropathy or effusion. Ligaments Intact Lisfranc ligament. The collateral ligaments of the remaining metatarsophalangeal joints are intact. Muscles and Tendons Mild muscular atrophy. Postsurgical changes related to previous amputations. No significant tenosynovitis. Soft tissues Postsurgical changes related to previous amputations with apparent sinus tract extending dorsally from the remaining 3rd metatarsal. Subcentimeter fluid collection adjacent to the remaining 3rd metatarsal with surrounding inflammatory changes. No drainable fluid collections are identified. IMPRESSION: 1. 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 Apparent sinus tract extending dorsally from the remaining 3rd metatarsal with a small fluid collection adjacent to the remaining 3rd metatarsal and prominent marrow edema extending proximally throughout the 3rd metatarsal shaft. 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 2. Nonspecific low-level T2 hyperintensity within the heads of the 2nd and 4th metatarsals without cortical destruction or abnormal T1 signal. 3. No drainable fluid collections identified. Electronically Signed   By: Carey Bullocks M.D.   On: 10/27/2023 10:30   VAS [place] ABI WITH/WO TBI Result Date: 10/26/2023  LOWER EXTREMITY DOPPLER STUDY Patient Name:  Jeremy [name]  Date of Exam:   10/26/2023 Medical Rec #: [id number]        Accession #:    [id number] Date of Birth: [birth year]        Patient Gender: M Patient Age:   51 years Exam Location:  [facility] Procedure:      VAS [place]  ABI WITH/WO TBI Referring Phys: Lyn Hollingshead STANDIFORD --------------------------------------------------------------------------------  Indications: Ulceration. High Risk Factors: Hypertension, hyperlipidemia, Diabetes.  Limitations: Today's exam was limited due to Great toe girth, involuntary              patient movement, an open wound and bandages. Comparison Study: No prior studies. Performing Technologist: Olen Cordial RVT  Examination Guidelines: A complete evaluation includes at minimum, Doppler waveform signals and systolic blood pressure reading at the level of bilateral brachial, anterior tibial, and posterior tibial arteries, when vessel segments are accessible. Bilateral testing is considered an integral part of a complete examination. Photoelectric Plethysmograph (PPG) waveforms and toe systolic pressure readings are included as required and additional duplex testing as needed. Limited examinations for reoccurring indications may be performed as noted.  ABI Findings: +--------+------------------+-----+---------+--------+ Right   Rt Pressure (mmHg)IndexWaveform Comment  +--------+------------------+-----+---------+--------+ [id number]                    triphasic         +--------+------------------+-----+---------+--------+ PTA  219               1.34 triphasic         +--------+------------------+-----+---------+--------+ DP      229               1.40 triphasic         +--------+------------------+-----+---------+--------+ +--------+------------------+-----+-----------+-------+ Left    Lt Pressure (mmHg)IndexWaveform   Comment +--------+------------------+-----+-----------+-------+ [id number]                    triphasic          +--------+------------------+-----+-----------+-------+ PTA     242               1.48 multiphasic        +--------+------------------+-----+-----------+-------+ DP      244               1.50 multiphasic         +--------+------------------+-----+-----------+-------+ +-------+-----------+-----------+------------+------------+ ABI/TBIToday's ABIToday's TBIPrevious ABIPrevious TBI +-------+-----------+-----------+------------+------------+ Right  1.4                                            +-------+-----------+-----------+------------+------------+ Left   1.5                                            +-------+-----------+-----------+------------+------------+  Summary: Right: Resting right ankle-brachial index indicates noncompressible right lower extremity arteries. Unable to obtain TBI due to great toe girth. Left: Resting left ankle-brachial index indicates noncompressible left lower extremity arteries. Unable to obtain TBI due to great toe girth. *See table(s) above for measurements and observations.  Electronically signed by Sherald Hess MD on 10/26/2023 at 4:47:59 PM.    Final      Assessment/Plan: Left foot osteomyelitis = getting ray amputation plus plan to start iv vancomycin and unasyn with HD, will likely transition to iv with hd at discharge  Possible aortic valve vegetation /hx of av endocarditis= getting TEE tomorrow for better evaluation of abnormality to see if c/w vegetation to then need to treat for endocarditis  Severe AR= getting teeth extraction on 2/21 by dr Barbette Merino to help preparation AV replacement TBA  Fluid overload = continues per nephrology if needs additional sessions to help with fluid status  [facility] Pager: [phone]  10/28/2023, 2:28 PM

## 2023-10-28 NOTE — Progress Notes (Signed)
 Pt receives out-pt HD at [facility] on MWF. Will assist as needed.   Olivia Canter Renal Navigator [phone]

## 2023-10-28 NOTE — Inpatient Diabetes Management (Signed)
 Inpatient Diabetes Program Recommendations  AACE/ADA: New Consensus Statement on Inpatient Glycemic Control (2015)  Target Ranges:  Prepandial:   less than 140 mg/dL      Peak postprandial:   less than 180 mg/dL (1-2 hours)      Critically ill patients:  140 - 180 mg/dL   Lab Results  Component Value Date   GLUCAP 61 (L) 10/28/2023   HGBA1C 6.8 (H) 09/03/2023    Review of Glycemic Control  Latest Reference Range & Units 10/28/23 08:49 10/28/23 11:24 10/28/23 14:51  Glucose-Capillary 70 - 99 mg/dL 161 (H) 096 (H) 61 (L)  (H): Data is abnormally high (L): Data is abnormally low Diabetes history: Type 2 DM Outpatient Diabetes medications: Novolog 17 units TID, Semglee 20 units QHS Current orders for Inpatient glycemic control: Novolog 0-15 units TID, Semglee 20 units QHS  Inpatient Diabetes Program Recommendations:     Noted hypoglycemia following correction. With renal status, consider reducing correction to Novolog 0-6 units TID.   Thanks, Lujean Rave, MSN, RNC-OB Diabetes Coordinator [phone] (8a-5p)

## 2023-10-29 ENCOUNTER — Inpatient Hospital Stay (HOSPITAL_COMMUNITY): Payer: 59 | Admitting: Anesthesiology

## 2023-10-29 ENCOUNTER — Inpatient Hospital Stay (HOSPITAL_COMMUNITY): Payer: 59

## 2023-10-29 ENCOUNTER — Encounter (HOSPITAL_COMMUNITY)
Admission: EM | Disposition: A | Discharge status: Home / Self Care | Payer: Self-pay | Source: Home / Self Care | Attending: Internal Medicine

## 2023-10-29 ENCOUNTER — Encounter: Payer: Medicaid Other | Admitting: Podiatry

## 2023-10-29 ENCOUNTER — Encounter (HOSPITAL_COMMUNITY): Payer: Self-pay | Admitting: Internal Medicine

## 2023-10-29 DIAGNOSIS — N186 End stage renal disease: Secondary | ICD-10-CM

## 2023-10-29 DIAGNOSIS — I132 Hypertensive heart and chronic kidney disease with heart failure and with stage 5 chronic kidney disease, or end stage renal disease: Secondary | ICD-10-CM

## 2023-10-29 DIAGNOSIS — M86172 Other acute osteomyelitis, left ankle and foot: Secondary | ICD-10-CM | POA: Diagnosis not present

## 2023-10-29 DIAGNOSIS — E1122 Type 2 diabetes mellitus with diabetic chronic kidney disease: Secondary | ICD-10-CM

## 2023-10-29 DIAGNOSIS — I509 Heart failure, unspecified: Secondary | ICD-10-CM

## 2023-10-29 DIAGNOSIS — I351 Nonrheumatic aortic (valve) insufficiency: Secondary | ICD-10-CM

## 2023-10-29 DIAGNOSIS — J9601 Acute respiratory failure with hypoxia: Secondary | ICD-10-CM | POA: Diagnosis not present

## 2023-10-29 DIAGNOSIS — R7881 Bacteremia: Secondary | ICD-10-CM | POA: Diagnosis not present

## 2023-10-29 HISTORY — PX: TRANSESOPHAGEAL ECHOCARDIOGRAM (CATH LAB): EP1270

## 2023-10-29 LAB — GLUCOSE, CAPILLARY
Glucose-Capillary: 149 mg/dL — ABNORMAL HIGH (ref 70–99)
Glucose-Capillary: 137 mg/dL — ABNORMAL HIGH (ref 70–99)
Glucose-Capillary: 210 mg/dL — ABNORMAL HIGH (ref 70–99)
Glucose-Capillary: 217 mg/dL — ABNORMAL HIGH (ref 70–99)

## 2023-10-29 LAB — ECHO TEE
Est EF: 35
S' Lateral: 4.8 cm

## 2023-10-29 LAB — RENAL FUNCTION PANEL
Albumin: 2.7 g/dL — ABNORMAL LOW (ref 3.5–5.0)
Anion gap: 16 — ABNORMAL HIGH (ref 5–15)
BUN: 58 mg/dL — ABNORMAL HIGH (ref 6–20)
CO2: 24 mmol/L (ref 22–32)
Calcium: 9.2 mg/dL (ref 8.9–10.3)
Chloride: 97 mmol/L — ABNORMAL LOW (ref 98–111)
Creatinine, Ser: 10.46 mg/dL — ABNORMAL HIGH (ref 0.61–1.24)
GFR, Estimated: 6 mL/min — ABNORMAL LOW (ref >60)
Glucose, Bld: 108 mg/dL — ABNORMAL HIGH (ref 70–99)
Phosphorus: 8.7 mg/dL — ABNORMAL HIGH (ref 2.5–4.6)
Potassium: 4.4 mmol/L (ref 3.5–5.1)
Sodium: 137 mmol/L (ref 135–145)

## 2023-10-29 LAB — CBC
HCT: 34.9 % — ABNORMAL LOW (ref 39.0–52.0)
Hemoglobin: 11.2 g/dL — ABNORMAL LOW (ref 13.0–17.0)
MCH: 27.6 pg (ref 26.0–34.0)
MCHC: 32.1 g/dL (ref 30.0–36.0)
MCV: 86 fL (ref 80.0–100.0)
Platelets: 283 10*3/uL (ref 150–400)
RBC: 4.06 MIL/uL — ABNORMAL LOW (ref 4.22–5.81)
RDW: 19.8 % — ABNORMAL HIGH (ref 11.5–15.5)
WBC: 8.9 10*3/uL (ref 4.0–10.5)
nRBC: 0 % (ref 0.0–0.2)

## 2023-10-29 LAB — LIPOPROTEIN A (LPA): Lipoprotein (a): 217.3 nmol/L — ABNORMAL HIGH (ref <75.0)

## 2023-10-29 SURGERY — TRANSESOPHAGEAL ECHOCARDIOGRAM (TEE) (CATHLAB)
Anesthesia: Monitor Anesthesia Care

## 2023-10-29 MED ORDER — PROPOFOL 10 MG/ML IV BOLUS
INTRAVENOUS | Status: DC | PRN
  Administered 2023-10-29: 250 ug/kg/min via INTRAVENOUS
  Administered 2023-10-29 (×2): 50 mg via INTRAVENOUS

## 2023-10-29 MED ORDER — SODIUM CHLORIDE 0.9 % IV SOLN
1.0000 g | INTRAVENOUS | Status: DC
  Administered 2023-10-30: 1 g via INTRAVENOUS
  Filled 2023-10-29: qty 1

## 2023-10-29 MED ORDER — SODIUM CHLORIDE 0.9 % IV SOLN: 1.0000 g | INTRAVENOUS | Status: DC

## 2023-10-29 MED ORDER — INSULIN GLARGINE-YFGN 100 UNIT/ML ~~LOC~~ SOLN
15.0000 [IU] | Freq: Every day | SUBCUTANEOUS | Status: DC
  Administered 2023-10-29: 15 [IU] via SUBCUTANEOUS
  Filled 2023-10-29 (×2): qty 0.15

## 2023-10-29 MED ORDER — VANCOMYCIN HCL IN DEXTROSE 1-5 GM/200ML-% IV SOLN
1000.0000 mg | INTRAVENOUS | Status: DC
  Filled 2023-10-29: qty 200

## 2023-10-29 NOTE — Progress Notes (Signed)
 Pharmacy Antibiotic Note  Jeremy [name] is a 43 y.o. male admitted on 10/26/2023 with  recurrent osteomyelitis following recent toe amputation . Prior to admission completed course of ciprofloxacin. Now undergoing amputation 2/19, plan for tooth extraction 2/21. Pharmacy has been consulted for vancomycin/unasyn dosing. 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  Afeb, wbc normal, and last HD 2/19 AM and doses given post-op .  Plan: Resumed vancomycin 1000 mg IV post-HD (MWF) Initiate Ceftazidime 1 g IV qMWF at 1800 post-HD Stop amp-sulbactam Cards w/u - AVR, dental extraction (2/21) F/u margins, culture data, plan for 6 weeks  Height: 6\' 5"  (195.6 cm) Weight: 108.9 kg (240 lb) IBW/kg (Calculated) : 89.1  Temp (24hrs), Avg:98.1 F (36.7 C), Min:97.4 F (36.3 C), Max:98.9 F (37.2 C)  Recent Labs  Lab 10/26/23 0335 10/26/23 0446 10/26/23 0637 10/26/23 0649 10/26/23 1605 10/27/23 0335 10/28/23 0742 10/28/23 1228 10/29/23 0218  WBC 15.1*  --   --  12.8*  --  10.4 7.6  --  8.9  CREATININE  --    < > 13.45*  --   --  11.15* 7.41* 10.00* 10.46*  LATICACIDVEN  --   --   --  2.1* 1.8  --   --   --   --    < > = values in this interval not displayed.    Estimated Creatinine Clearance: 12.5 mL/min (A) (by C-G formula based on SCr of 10.46 mg/dL (H)).    Allergies  Allergen Reactions   Acyclovir And Related Hives   Imdur [Isosorbide Nitrate]     Headaches from nitrate    Antimicrobials this admission: Unasyn 2/19 >> 2/20 Ceftaz 2/20 >> Vanco 2/17 >> 2/18; 2/19 >> Cefepime 2/17 >> 2/18  Microbiology results: 12/28 Bone/tissue : proteus and bacteroides 2/19 Tissue: 2/18 Bcx: no growth <24h 2/18 MRSA PCR: negative  Thank you for allowing pharmacy to be a part of this patient's care.  Knute Neu 10/29/2023 10:09 AM

## 2023-10-29 NOTE — Anesthesia Preprocedure Evaluation (Signed)
 Anesthesia Evaluation  Patient identified by MRN, date of birth, ID band Patient awake    Reviewed: Allergy & Precautions, NPO status , Patient's Chart, lab work & pertinent test results  History of Anesthesia Complications (+) PONV, Family history of anesthesia reaction and history of anesthetic complications (reports sister died)  Airway Mallampati: III  TM Distance: >3 FB Neck ROM: Full    Dental  (+) Dental Advisory Given   Pulmonary neg shortness of breath, asthma (does not use inhalers) , neg sleep apnea, neg COPD, Recent URI  (flu a month ago), Resolved H/o flash pulmonary edema   Pulmonary exam normal breath sounds clear to auscultation       Cardiovascular hypertension (carvedilol, hydralazine), Pt. on home beta blockers and Pt. on medications (-) angina +CHF (EF 35%)  (-) Past MI, (-) Cardiac Stents and (-) CABG (-) dysrhythmias + Valvular Problems/Murmurs (severe) AI  Rhythm:Regular Rate:Normal  HLD  TEE 10/29/2023: IMPRESSIONS    1. Left ventricular ejection fraction, by estimation, is 35%. The left  ventricle has moderately decreased function. The left ventricle  demonstrates global hypokinesis. The left ventricular internal cavity size  was mildly to moderately dilated.   2. Right ventricular systolic function is mildly reduced. The right  ventricular size is normal.   3. No left atrial/left atrial appendage thrombus was detected.   4. No PFO/ASD by color doppler.   5. There is a mobile vegetation that appears calcified on the right cusp,  0.9 cm. There is a smaller mobile vegetation on the left cusp. The right  and noncoronary cusps do not fully coapt. I do not see a definite  perforation. The aortic valve is  tricuspid. Aortic valve regurgitation is severe. No aortic stenosis is  present. Difficult to assess but there does appear to be holodiastolic  flow reversal in the proximal descending thoracic aorta.    6. The mitral valve is normal in structure. Trivial mitral valve  regurgitation. No evidence of mitral stenosis.   7. The dialysis catheter extends into the right atrium, no vegetation  noted.   8. 3D performed of the aortic valve.     Neuro/Psych Seizures - (x1 2-3 years ago, not on AEDs),  PSYCHIATRIC DISORDERS  Depression       GI/Hepatic Neg liver ROS,GERD  ,,  Endo/Other  diabetes, Type 2, Insulin Dependent    Renal/GU ESRF and DialysisRenal disease (last HD on 10/28/2023)     Musculoskeletal   Abdominal   Peds  Hematology  (+) Blood dyscrasia, anemia Lab Results      Component                Value               Date                      WBC                      8.9                 10/29/2023                HGB                      11.2 (L)            10/29/2023  HCT                      34.9 (L)            10/29/2023                MCV                      86.0                10/29/2023                PLT                      283                 10/29/2023              Anesthesia Other Findings Old endocarditis  Renal panel not yet back. K 5.0 on iSTAT.  Reproductive/Obstetrics                             Anesthesia Physical Anesthesia Plan  ASA: 4  Anesthesia Plan: General   Post-op Pain Management:    Induction: Intravenous  PONV Risk Score and Plan: 3 and Ondansetron, Dexamethasone and Treatment may vary due to age or medical condition  Airway Management Planned: Nasal ETT and Video Laryngoscope Planned  Additional Equipment:   Intra-op Plan:   Post-operative Plan: Extubation in OR  Informed Consent: I have reviewed the patients History and Physical, chart, labs and discussed the procedure including the risks, benefits and alternatives for the proposed anesthesia with the patient or authorized representative who has indicated his/her understanding and acceptance.     Dental advisory given  Plan Discussed  with: CRNA and Anesthesiologist  Anesthesia Plan Comments: (Risks of general anesthesia discussed including, but not limited to, sore throat, hoarse voice, chipped/damaged teeth, injury to vocal cords, nausea and vomiting, allergic reactions, lung infection, heart attack, stroke, and death. All questions answered. )       Anesthesia Quick Evaluation

## 2023-10-29 NOTE — Transfer of Care (Signed)
 Immediate Anesthesia Transfer of Care Note  Patient: Jeremy [name]  Procedure(s) Performed: TRANSESOPHAGEAL ECHOCARDIOGRAM  Patient Location: PACU  Anesthesia Type:General  Level of Consciousness: sedated  Airway & Oxygen Therapy: Patient Spontanous Breathing  Post-op Assessment: Report given to RN  Post vital signs: Reviewed and stable  Last Vitals:  Vitals Value Taken Time  BP 99/27 10/29/23 0857  Temp 36.7 C 10/29/23 0857  Pulse 88 10/29/23 0900  Resp 19 10/29/23 0900  SpO2 100 % 10/29/23 0900  Vitals shown include unfiled device data.  Last Pain:  Vitals:   10/29/23 0857  TempSrc: Temporal  PainSc: 0-No pain      Patients Stated Pain Goal: 0 (10/29/23 0731)  Complications: No notable events documented.

## 2023-10-29 NOTE — Progress Notes (Addendum)
 [facility]  Date of Admission:  10/26/2023      Total days of antibiotics 3   Vancomycin 2/17 >> c  Unasyn 2/19 >> c         ASSESSMENT: Jeremy [name] is a 43 y.o. male admitted with:  H/O Aortic Valve Endocarditis -  Severe AI -  E Faecalis 2023, S/P 6 weeks Vanc/Gent through 08/28/22 -  TEE read more c/w old endocarditis. Needs valve replaced nonetheless but no urgent conditions that would warrant that to be done now.  Blood cultures from this admission are without growth.  -Dental extractions tomorrow -OP follow up with CT surgery planned.   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 3rd metatarsal bone and swab culture pending and no growth to date, preliminarily.  Bone biopsy 4th metatarsal head and 4th proximal phalanx - pending read.  With planned valve forecasted, would recommend aggressive treatment to ensure well treated - would recommend Ceftaz + Vancomycin with HD outpatient to complete 6 weeks from surgery with EOT 4/2.   ESRD -  Coordinate with nephrology re: abx plan   Poor Dentition -  Dr. Barbette Merino following and planning extractions tomorrow    PLAN: Vancomycin + Ceftaz with HD EOT 4/2 Plan follow up with ID near end of therapy.  Follow up with Dr. Drue Second arranged 4/3 at 1:30  ID will sign off - please call back with any questions/concerns or if we can be of further assistance.    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   Review of Systems: Review of Systems  Constitutional:  Negative for chills and fever.    Allergies  Allergen Reactions   Acyclovir And Related Hives   Imdur [Isosorbide Nitrate]     Headaches from nitrate    OBJECTIVE: Vitals:   10/29/23 0915 10/29/23 0920 10/29/23 0927 10/29/23 0940  BP: (!) 102/44 (!) 108/48 (!) 102/52 (!) 101/58  Pulse: 95 85    Resp: (!) 28 (!) 21    Temp:      TempSrc:      SpO2: 96% 99%    Weight:      Height:       Body mass index is 28.46 kg/m.  Physical Exam Constitutional:      Appearance: Normal appearance. He is well-developed.  Cardiovascular:     Rate and Rhythm: Normal rate.  Neurological:  Mental Status: He is alert.     Lab Results Lab Results  Component Value Date   WBC 8.9 10/29/2023   HGB 11.2 (L) 10/29/2023   HCT 34.9 (L) 10/29/2023   MCV 86.0 10/29/2023   PLT 283 10/29/2023    Lab Results  Component Value Date   CREATININE 10.46 (H) 10/29/2023   BUN 58 (H) 10/29/2023   NA 137 10/29/2023   K 4.4 10/29/2023   CL 97 (L) 10/29/2023   CO2 24 10/29/2023    Lab Results  Component Value Date   ALT 15 10/26/2023   AST 18 10/26/2023   ALKPHOS 45 10/26/2023   BILITOT 0.7 10/26/2023     Microbiology: Recent Results (from the past 240 hours)  Resp panel by RT-PCR (RSV, Flu A&B, Covid) Anterior Nasal Swab     Status: None   Collection Time: 10/26/23  3:35 AM   Specimen: Anterior Nasal Swab  Result Value Ref Range Status   SARS Coronavirus 2 by RT PCR NEGATIVE NEGATIVE Final   Influenza A by PCR  NEGATIVE NEGATIVE Final   Influenza B by PCR NEGATIVE NEGATIVE Final    Comment: (NOTE) The Xpert Xpress SARS-CoV-2/FLU/RSV plus assay is intended as an aid in the diagnosis of influenza from Nasopharyngeal swab specimens and should not be used as a sole basis for treatment. Nasal washings and aspirates are unacceptable for Xpert Xpress SARS-CoV-2/FLU/RSV testing.  Fact Sheet for Patients: BloggerCourse.com  Fact Sheet for Healthcare Providers: SeriousBroker.it  This test is not yet approved or cleared by the [place] FDA and has been authorized for detection and/or diagnosis of SARS-CoV-2 by FDA under an Emergency Use Authorization (EUA). This EUA will remain in effect (meaning this test can be used) for the duration of the COVID-19 declaration under Section 564(b)(1) of the Act, 21 U.S.C. section 360bbb-3(b)(1), unless the authorization is terminated or revoked.     Resp Syncytial Virus by PCR NEGATIVE NEGATIVE Final    Comment: (NOTE) Fact Sheet for Patients: BloggerCourse.com  Fact Sheet for Healthcare Providers: SeriousBroker.it  This test is not yet approved or cleared by the [place] FDA and has been authorized for detection and/or diagnosis of SARS-CoV-2 by FDA under an Emergency Use Authorization (EUA). This EUA will remain in effect (meaning this test can be used) for the duration of the COVID-19 declaration under Section 564(b)(1) of the Act, 21 U.S.C. section 360bbb-3(b)(1), unless the authorization is terminated or revoked.  Performed at [facility], [address] [place], [place] [address]   Culture, blood (Routine X 2) w Reflex to ID Panel     Status: None (Preliminary result)   Collection Time: 10/26/23  7:03 AM   Specimen: BLOOD  Result Value Ref Range Status   Specimen Description BLOOD SITE NOT SPECIFIED  Final   Special Requests    Final    BOTTLES DRAWN AEROBIC AND ANAEROBIC Blood Culture adequate volume   Culture   Final    NO GROWTH 3 DAYS Performed at [facility], [address] [place], [place] [address]    Report Status PENDING  Incomplete  Culture, blood (Routine X 2) w Reflex to ID Panel     Status: None (Preliminary result)   Collection Time: 10/27/23  2:18 PM   Specimen: BLOOD RIGHT ARM  Result Value Ref Range Status   Specimen Description BLOOD RIGHT ARM  Final   Special Requests   Final    BOTTLES DRAWN AEROBIC AND ANAEROBIC Blood  Culture results may not be optimal due to an inadequate volume of blood received in culture bottles   Culture   Final    NO GROWTH 2 DAYS Performed at [facility], [address] [place], [place] [address]    Report Status PENDING  Incomplete  Culture, blood (Routine X 2) w Reflex to ID Panel     Status: None (Preliminary result)   Collection Time: 10/27/23  2:18 PM   Specimen: BLOOD LEFT ARM  Result Value Ref Range Status   Specimen Description BLOOD LEFT ARM  Final   Special Requests   Final    BOTTLES DRAWN AEROBIC AND ANAEROBIC Blood Culture results may not be optimal due to an inadequate volume of blood received in culture bottles   Culture   Final    NO GROWTH 2 DAYS Performed at [facility], [address] [place] [address], [place], [place] [address]    Report Status PENDING  Incomplete  MRSA Next Gen by PCR, Nasal     Status: None   Collection Time: 10/27/23  2:30 PM   Specimen: Nasal Mucosa; Nasal Swab  Result Value Ref Range Status   MRSA by PCR Next Gen NOT DETECTED NOT DETECTED Final    Comment: (NOTE) The GeneXpert MRSA Assay (FDA approved for NASAL specimens only), is one component of a comprehensive MRSA colonization surveillance program. It is not intended to diagnose MRSA infection nor to guide or monitor treatment for MRSA infections. Test performance is not FDA approved in patients less than 38 years old. Performed at [facility]  Lab, [address], [place], [place] [address]   Surgical pcr screen     Status: None   Collection Time: 10/28/23 11:56 AM   Specimen: Nasal Mucosa; Nasal Swab  Result Value Ref Range Status   MRSA, PCR NEGATIVE NEGATIVE Final   Staphylococcus aureus NEGATIVE NEGATIVE Final    Comment: (NOTE) The Xpert SA Assay (FDA approved for NASAL specimens in patients 75 years of age and older), is one component of a comprehensive surveillance program. It is not intended to diagnose infection nor to guide or monitor treatment. Performed at [facility], [address], [place], [place] [address]   Aerobic/Anaerobic Culture w Gram Stain (surgical/deep wound)     Status: None (Preliminary result)   Collection Time: 10/28/23  2:18 PM   Specimen: Bone; Tissue  Result Value Ref Range Status   Specimen Description BONE TISSUE  Final   Special Requests 3RD METATARSAL SAMPLE A  Final   Gram Stain   Final    RARE WBC PRESENT,BOTH PMN AND MONONUCLEAR NO ORGANISMS SEEN    Culture   Final    NO GROWTH < 12 HOURS Performed at [facility], [address] [place], [place] [address]    Report Status PENDING  Incomplete  Aerobic/Anaerobic Culture w Gram Stain (surgical/deep wound)     Status: None (Preliminary result)   Collection Time: 10/28/23  2:19 PM   Specimen: Bone; Tissue  Result Value Ref Range Status   Specimen Description LEFT FOOT  Final   Special Requests  SWABS OF 3RD METATARSAL SAMPLE B  Final   Gram Stain NO WBC SEEN NO ORGANISMS SEEN   Final   Culture   Final    NO GROWTH < 24 HOURS Performed at [facility], [address], [place], [place] [address]    Report Status PENDING  Incomplete     Judeth Cornfield  Durwin Nora, MSN, NP-C [facility]  [email or web address] Pager: [phone] Office: [phone] [facility] Main Line: [phone] *Secure Chat Communication Welcome

## 2023-10-29 NOTE — Consult Note (Signed)
 Reason for Consult:Dental consult prior to cardiac surgery Referring Physician: Marca Ancona, MD  Jeremy [name] is an 43 y.o. male h/o aortic valve endocarditis,  h/o severe aortic regurgitation, ESRD on dialysis, with planned AVR.  CC: No dental pain, swelling, sensitivity.  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 01/2015 Lexiscan MV: EF 28%, inferior, inferolateral, apical ischemia;  b. 01/2015 Cath: nl cors, PCWP 18 mmHg, CO 9.38 L/min, CI 3.53 L/min/m^2.   CHF (congestive heart failure) (HCC)    Depression    Situational   ESRD (end stage renal disease) (HCC)    [facility]- [place]   Essential hypertension    Family history of adverse reaction to anesthesia    sister- "it was too much for her heart" died   Flash pulmonary edema (HCC) 09/18/2022   GERD (gastroesophageal reflux disease)    Hyperlipidemia    Membranous glomerulonephritis    Morbid obesity (HCC)    Nonischemic cardiomyopathy (HCC)    a. 01/2015 Echo: EF 20-25%, diff HK, Gr 2 DD, Triv AI, mildly dil LA and Ao root.   PONV (postoperative nausea and vomiting)    Seizures (HCC) 02/29/2020   "electralytes were out of wack."   Type II diabetes mellitus (HCC)    a. 01/2015 HbA1c = 8.9.    Past Surgical History:  Procedure Laterality Date   A/V FISTULAGRAM N/A 08/12/2021   Procedure: A/V FISTULAGRAM;  Surgeon: Maeola Harman, MD;  Location: [facility] INVASIVE CV LAB;  Service: Cardiovascular;  Laterality: N/A;   AMPUTATION Left 10/28/2023   Procedure: AMPUTATION RAY FOOT;  Surgeon: Pilar Plate, DPM;  Location: [facility] OR;  Service: Orthopedics/Podiatry;  Laterality: Left;  Partial 3rd ray resection, I&D, antibiotic beads, wound vac   AV FISTULA PLACEMENT Right 12/18/2020   Procedure: RIGHT ARM RADIOCEPHALIC  ARTERIOVENOUS (AV) FISTULA CREATION;   Surgeon: Maeola Harman, MD;  Location: [facility] OR;  Service: Vascular;  Laterality: Right;   CARDIAC CATHETERIZATION N/A 02/01/2015   Procedure: Right/Left Heart Cath and Coronary Angiography;  Surgeon: Wendall Stade, MD;  Location: [facility] INVASIVE CV LAB;  Service: Cardiovascular;  Laterality: N/A;   INCISION AND DRAINAGE Left 09/05/2023   Procedure: INCISION AND DRAINAGE WITH 3RD TOE METATARSAL RESECTION LEFT FOOT AND 4TH TOE PROXIMAL PHALANX RESECTION;  Surgeon: Pilar Plate, DPM;  Location: [facility] OR;  Service: Orthopedics/Podiatry;  Laterality: Left;  I&D with possible metatarsal resection, left foot   IR FLUORO GUIDE CV LINE LEFT  07/21/2022   IR FLUORO GUIDE CV LINE LEFT  07/21/2022   IR FLUORO GUIDE CV LINE RIGHT  10/11/2019   IR REMOVAL TUN CV CATH W/O FL  07/17/2022   IR REMOVAL TUN CV CATH W/O FL  08/18/2022   IR US GUIDE VASC ACCESS LEFT  07/21/2022   IR US GUIDE VASC ACCESS LEFT  07/21/2022   IR US GUIDE VASC ACCESS RIGHT  10/11/2019   TEE WITHOUT CARDIOVERSION N/A 07/18/2022   Procedure: TRANSESOPHAGEAL ECHOCARDIOGRAM (TEE);  Surgeon: Maisie Fus, MD;  Location: [facility] ENDOSCOPY;  Service: Cardiovascular;  Laterality: N/A;   THORACOTOMY Left 11/08/2013   Procedure: LEFT THORACOTOMY;  Surgeon: Kerin Perna, MD;  Location: [facility] OR;  Service: Thoracic;  Laterality: Left;    Family History  Problem Relation Age  of Onset   Diabetes Mellitus II Mother        died @ 30.   Gastric cancer Mother    CAD Father        died @ 29.   Heart attack Father    Congestive Heart Failure Father    Diabetes Mellitus II Sister    CAD Sister        s/p PCI - age 37.    Social History:  reports that he has never smoked. He has never used smokeless tobacco. He reports current drug use. Drug: Marijuana. He reports that he does not drink alcohol.  Allergies:  Allergies  Allergen Reactions   Acyclovir And Related Hives   Imdur [Isosorbide Nitrate]     Headaches from nitrate     Medications: I have reviewed the patient's current medications.  Results for orders placed or performed during the hospital encounter of 10/26/23 (from the past 48 hours)  CBG monitoring, ED     Status: Abnormal   Collection Time: 10/27/23 11:46 AM  Result Value Ref Range   Glucose-Capillary 130 (H) 70 - 99 mg/dL    Comment: Glucose reference range applies only to samples taken after fasting for at least 8 hours.  CBG monitoring, ED     Status: Abnormal   Collection Time: 10/27/23  1:29 PM  Result Value Ref Range   Glucose-Capillary 140 (H) 70 - 99 mg/dL    Comment: Glucose reference range applies only to samples taken after fasting for at least 8 hours.  Culture, blood (Routine X 2) w Reflex to ID Panel     Status: None (Preliminary result)   Collection Time: 10/27/23  2:18 PM   Specimen: BLOOD RIGHT ARM  Result Value Ref Range   Specimen Description BLOOD RIGHT ARM    Special Requests      BOTTLES DRAWN AEROBIC AND ANAEROBIC Blood Culture results may not be optimal due to an inadequate volume of blood received in culture bottles   Culture      NO GROWTH 2 DAYS Performed at [facility], [address] [place], [place] [address]    Report Status PENDING   Culture, blood (Routine X 2) w Reflex to ID Panel     Status: None (Preliminary result)   Collection Time: 10/27/23  2:18 PM   Specimen: BLOOD LEFT ARM  Result Value Ref Range   Specimen Description BLOOD LEFT ARM    Special Requests      BOTTLES DRAWN AEROBIC AND ANAEROBIC Blood Culture results may not be optimal due to an inadequate volume of blood received in culture bottles   Culture      NO GROWTH 2 DAYS Performed at [facility], [address], [place], [place] [address]    Report Status PENDING   MRSA Next Gen by PCR, Nasal     Status: None   Collection Time: 10/27/23  2:30 PM   Specimen: Nasal Mucosa; Nasal Swab  Result Value Ref Range   MRSA by PCR Next Gen NOT DETECTED NOT DETECTED    Comment:  (NOTE) The GeneXpert MRSA Assay (FDA approved for NASAL specimens only), is one component of a comprehensive MRSA colonization surveillance program. It is not intended to diagnose MRSA infection nor to guide or monitor treatment for MRSA infections. Test performance is not FDA approved in patients less than 32 years old. Performed at [facility] Lab, [address] [place], [place] [address]   Glucose, capillary  Status: Abnormal   Collection Time: 10/27/23  4:43 PM  Result Value Ref Range   Glucose-Capillary 173 (H) 70 - 99 mg/dL    Comment: Glucose reference range applies only to samples taken after fasting for at least 8 hours.  Glucose, capillary     Status: Abnormal   Collection Time: 10/27/23  9:15 PM  Result Value Ref Range   Glucose-Capillary 215 (H) 70 - 99 mg/dL    Comment: Glucose reference range applies only to samples taken after fasting for at least 8 hours.  CBC with Differential/Platelet     Status: Abnormal   Collection Time: 10/28/23  7:42 AM  Result Value Ref Range   WBC 7.6 4.0 - 10.5 K/uL   RBC 4.71 4.22 - 5.81 MIL/uL   Hemoglobin 12.9 (L) 13.0 - 17.0 g/dL   HCT 16.1 09.6 - 04.5 %   MCV 86.2 80.0 - 100.0 fL   MCH 27.4 26.0 - 34.0 pg   MCHC 31.8 30.0 - 36.0 g/dL   RDW 40.9 (H) 81.1 - 91.4 %   Platelets 344 150 - 400 K/uL   nRBC 0.0 0.0 - 0.2 %   Neutrophils Relative % 59 %   Neutro Abs 4.4 1.7 - 7.7 K/uL   Lymphocytes Relative 26 %   Lymphs Abs 2.0 0.7 - 4.0 K/uL   Monocytes Relative 11 %   Monocytes Absolute 0.8 0.1 - 1.0 K/uL   Eosinophils Relative 3 %   Eosinophils Absolute 0.3 0.0 - 0.5 K/uL   Basophils Relative 1 %   Basophils Absolute 0.1 0.0 - 0.1 K/uL   Immature Granulocytes 0 %   Abs Immature Granulocytes 0.02 0.00 - 0.07 K/uL    Comment: Performed at [facility], [address], [place], [place] [address]  Renal function panel     Status: Abnormal   Collection Time: 10/28/23  7:42 AM  Result Value Ref Range   Sodium 133 (L)  135 - 145 mmol/L   Potassium 3.5 3.5 - 5.1 mmol/L   Chloride 92 (L) 98 - 111 mmol/L   CO2 25 22 - 32 mmol/L   Glucose, Bld 133 (H) 70 - 99 mg/dL    Comment: Glucose reference range applies only to samples taken after fasting for at least 8 hours.   BUN 43 (H) 6 - 20 mg/dL   Creatinine, Ser 6.21 (H) 0.61 - 1.24 mg/dL   Calcium 9.2 8.9 - 30.8 mg/dL   Phosphorus 4.9 (H) 2.5 - 4.6 mg/dL   Albumin 3.1 (L) 3.5 - 5.0 g/dL   GFR, Estimated 9 (L) >60 mL/min    Comment: (NOTE) Calculated using the CKD-EPI Creatinine Equation (2021)    Anion gap 16 (H) 5 - 15    Comment: Performed at [facility], [address], [place], [place] [address]  Glucose, capillary     Status: Abnormal   Collection Time: 10/28/23  8:49 AM  Result Value Ref Range   Glucose-Capillary 186 (H) 70 - 99 mg/dL    Comment: Glucose reference range applies only to samples taken after fasting for at least 8 hours.  Glucose, capillary     Status: Abnormal   Collection Time: 10/28/23 11:24 AM  Result Value Ref Range   Glucose-Capillary 155 (H) 70 - 99 mg/dL    Comment: Glucose reference range applies only to samples taken after fasting for at least 8 hours.  Surgical pcr screen     Status: None   Collection Time: 10/28/23  11:56 AM   Specimen: Nasal Mucosa; Nasal Swab  Result Value Ref Range   MRSA, PCR NEGATIVE NEGATIVE   Staphylococcus aureus NEGATIVE NEGATIVE    Comment: (NOTE) The Xpert SA Assay (FDA approved for NASAL specimens in patients 49 years of age and older), is one component of a comprehensive surveillance program. It is not intended to diagnose infection nor to guide or monitor treatment. Performed at [facility], [address], [place], [place] [address]   I-STAT, [place] 8     Status: Abnormal   Collection Time: 10/28/23 12:28 PM  Result Value Ref Range   Sodium 135 135 - 145 mmol/L   Potassium 4.0 3.5 - 5.1 mmol/L   Chloride 98 98 - 111 mmol/L   BUN 44 (H) 6 - 20 mg/dL   Creatinine, Ser  60.45 (H) 0.61 - 1.24 mg/dL   Glucose, Bld 409 (H) 70 - 99 mg/dL    Comment: Glucose reference range applies only to samples taken after fasting for at least 8 hours.   Calcium, Ion 1.09 (L) 1.15 - 1.40 mmol/L   TCO2 25 22 - 32 mmol/L   Hemoglobin 13.9 13.0 - 17.0 g/dL   HCT 81.1 91.4 - 78.2 %  Aerobic/Anaerobic Culture w Gram Stain (surgical/deep wound)     Status: None (Preliminary result)   Collection Time: 10/28/23  2:18 PM   Specimen: Bone; Tissue  Result Value Ref Range   Specimen Description BONE TISSUE    Special Requests 3RD METATARSAL SAMPLE A    Gram Stain      RARE WBC PRESENT,BOTH PMN AND MONONUCLEAR NO ORGANISMS SEEN    Culture      NO GROWTH < 12 HOURS Performed at [facility], [address] [place], [place] [address]    Report Status PENDING   Aerobic/Anaerobic Culture w Gram Stain (surgical/deep wound)     Status: None (Preliminary result)   Collection Time: 10/28/23  2:19 PM   Specimen: Bone; Tissue  Result Value Ref Range   Specimen Description LEFT FOOT    Special Requests  SWABS OF 3RD METATARSAL SAMPLE B    Gram Stain NO WBC SEEN NO ORGANISMS SEEN     Culture      NO GROWTH < 24 HOURS Performed at [facility] Lab, [address] [place], [place] [address]    Report Status PENDING   Glucose, capillary     Status: Abnormal   Collection Time: 10/28/23  2:51 PM  Result Value Ref Range   Glucose-Capillary 61 (L) 70 - 99 mg/dL    Comment: Glucose reference range applies only to samples taken after fasting for at least 8 hours.  Glucose, capillary     Status: Abnormal   Collection Time: 10/28/23  3:13 PM  Result Value Ref Range   Glucose-Capillary 112 (H) 70 - 99 mg/dL    Comment: Glucose reference range applies only to samples taken after fasting for at least 8 hours.  Glucose, capillary     Status: Abnormal   Collection Time: 10/28/23  9:13 PM  Result Value Ref Range   Glucose-Capillary 195 (H) 70 - 99 mg/dL    Comment: Glucose reference  range applies only to samples taken after fasting for at least 8 hours.   Comment 1 Document in Chart   CBC     Status: Abnormal   Collection Time: 10/29/23  2:18 AM  Result Value Ref Range   WBC 8.9 4.0 - 10.5 K/uL  RBC 4.06 (L) 4.22 - 5.81 MIL/uL   Hemoglobin 11.2 (L) 13.0 - 17.0 g/dL   HCT 40.9 (L) 81.1 - 91.4 %   MCV 86.0 80.0 - 100.0 fL   MCH 27.6 26.0 - 34.0 pg   MCHC 32.1 30.0 - 36.0 g/dL   RDW 78.2 (H) 95.6 - 21.3 %   Platelets 283 150 - 400 K/uL   nRBC 0.0 0.0 - 0.2 %    Comment: Performed at [facility], [address] [place], [place] [address]  Renal function panel     Status: Abnormal   Collection Time: 10/29/23  2:18 AM  Result Value Ref Range   Sodium 137 135 - 145 mmol/L   Potassium 4.4 3.5 - 5.1 mmol/L   Chloride 97 (L) 98 - 111 mmol/L   CO2 24 22 - 32 mmol/L   Glucose, Bld 108 (H) 70 - 99 mg/dL    Comment: Glucose reference range applies only to samples taken after fasting for at least 8 hours.   BUN 58 (H) 6 - 20 mg/dL   Creatinine, Ser 84.69 (H) 0.61 - 1.24 mg/dL   Calcium 9.2 8.9 - 62.9 mg/dL   Phosphorus 8.7 (H) 2.5 - 4.6 mg/dL   Albumin 2.7 (L) 3.5 - 5.0 g/dL   GFR, Estimated 6 (L) >60 mL/min    Comment: (NOTE) Calculated using the CKD-EPI Creatinine Equation (2021)    Anion gap 16 (H) 5 - 15    Comment: Performed at [facility], [address] [place], [place] [address]  Glucose, capillary     Status: Abnormal   Collection Time: 10/29/23  6:12 AM  Result Value Ref Range   Glucose-Capillary 149 (H) 70 - 99 mg/dL    Comment: Glucose reference range applies only to samples taken after fasting for at least 8 hours.   Comment 1 Document in Chart    *Note: Due to a large number of results and/or encounters for the requested time period, some results have not been displayed. A complete set of results can be found in Results Review.    ECHO TEE Result Date: 10/29/2023    TRANSESOPHOGEAL ECHO REPORT   Patient Name:   Jeremy [name] Date  of Exam: 10/29/2023 Medical Rec #:  [id number]       Height:       77.0 in Accession #:    [id number]      Weight:       240.0 lb Date of Birth:  1981-07-20       BSA:          2.417 m Patient Age:    42 years        BP:           144/63 mmHg Patient Gender: M               HR:           105 bpm. Exam Location:  Inpatient Procedure: 3D Echo, Color Doppler, Cardiac Doppler and Transesophageal Echo            (Both Spectral and Color Flow Doppler were utilized during            procedure). Indications:    Aortic Valve  History:        Patient has prior history of Echocardiogram examinations, most                 recent 10/26/2023.  Sonographer:    Leotis Shames  Aundria Rud RDCS Referring Phys: [id number] Cyndi Bender PROCEDURE: The transesophogeal probe was passed without difficulty through the esophogus of the patient. Sedation performed by different physician. The patient developed no complications during the procedure.  IMPRESSIONS  1. Left ventricular ejection fraction, by estimation, is 35%. The left ventricle has moderately decreased function. The left ventricle demonstrates global hypokinesis. The left ventricular internal cavity size was mildly to moderately dilated.  2. Right ventricular systolic function is mildly reduced. The right ventricular size is normal.  3. No left atrial/left atrial appendage thrombus was detected.  4. No PFO/ASD by color doppler.  5. There is a mobile vegetation that appears calcified on the right cusp, 0.9 cm. There is a smaller mobile vegetation on the left cusp. The right and noncoronary cusps do not fully coapt. I do not see a definite perforation. The aortic valve is tricuspid. Aortic valve regurgitation is severe. No aortic stenosis is present. Difficult to assess but there does appear to be holodiastolic flow reversal in the proximal descending thoracic aorta.  6. The mitral valve is normal in structure. Trivial mitral valve regurgitation. No evidence of mitral stenosis.  7. The dialysis catheter  extends into the right atrium, no vegetation noted.  8. 3D performed of the aortic valve. FINDINGS  Left Ventricle: Left ventricular ejection fraction, by estimation, is 35%. The left ventricle has moderately decreased function. The left ventricle demonstrates global hypokinesis. Strain imaging was not performed. The left ventricular internal cavity size was mildly to moderately dilated. Right Ventricle: The right ventricular size is normal. No increase in right ventricular wall thickness. Right ventricular systolic function is mildly reduced. Left Atrium: Left atrial size was normal in size. No left atrial/left atrial appendage thrombus was detected. Right Atrium: Right atrial size was normal in size. Pericardium: There is no evidence of pericardial effusion. Mitral Valve: The mitral valve is normal in structure. Trivial mitral valve regurgitation. No evidence of mitral valve stenosis. Tricuspid Valve: The tricuspid valve is normal in structure. Tricuspid valve regurgitation is trivial. No evidence of tricuspid stenosis. Aortic Valve: There is a mobile vegetation that appears calcified on the right cusp, 0.9 cm. There is a smaller mobile vegetation on the left cusp. The right and noncoronary cusps do not fully coapt. I do not see a definite perforation. The aortic valve is tricuspid. Aortic valve regurgitation is severe. No aortic stenosis is present. Pulmonic Valve: The pulmonic valve was normal in structure. Pulmonic valve regurgitation is not visualized. Aorta: The aortic root is normal in size and structure. IAS/Shunts: No PFO/ASD by color doppler. Additional Comments: 3D imaging was not performed. LEFT VENTRICLE PLAX 2D LVIDd:         5.80 cm LVIDs:         4.80 cm LVOT diam:     2.50 cm LVOT Area:     4.91 cm   SHUNTS Systemic Diam: 2.50 cm Dalton McleanMD Electronically signed by Wilfred Lacy Signature Date/Time: 10/29/2023/9:51:10 AM    Final    EP STUDY Result Date: 10/29/2023 See surgical note for  result.  DG Foot Complete Left Result Date: 10/28/2023 CLINICAL DATA:  Status post partial third ray resection. EXAM: LEFT FOOT - COMPLETE 3+ VIEW COMPARISON:  Left foot radiographs dated 10/20/2023. MRI left foot dated 10/26/2023. FINDINGS: Status post interval partial third ray resection through the proximal third metatarsal shaft with antibiotic beads in place at the level of the transsection margin. 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 No acute osseous abnormality identified.  Wound VAC in place with postsurgical soft tissue swelling and air. Vascular calcifications are noted. IMPRESSION: 1. Status post interval partial third ray resection through the proximal third metatarsal shaft with antibiotic beads in place. 2. Expected postsurgical soft tissue swelling and air with a wound VAC in place. Electronically Signed   By: Hart Robinsons M.D.   On: 10/28/2023 17:00    ROS Blood pressure (!) 101/58, pulse 85, temperature 98 F (36.7 C), temperature source Temporal, resp. rate (!) 21, height 6\' 5"  (1.956 m), weight 108.9 kg, SpO2 99%. General appearance: alert and no distress Head: Normocephalic, without obvious abnormality, atraumatic Eyes: negative Nose: Nares normal. Septum midline. Mucosa normal. No drainage or sinus tenderness. Throat: Limited bedside exam reveals no obvious decay, edema, fluctuance, inflammation. Bilateral mandibular lingual tori. Pharynx clear.  Neck: no adenopathy  Assessment/Plan: Last x ray was 2013. Will need CT today to evaluate teeth prior to surgery.  Ocie Doyne 10/29/2023, 10:31 AM

## 2023-10-29 NOTE — Plan of Care (Signed)

## 2023-10-29 NOTE — Anesthesia Procedure Notes (Signed)
 Procedure Name: MAC Date/Time: 10/29/2023 8:13 AM  Performed by: Bartholomew Crews, CRNAPre-anesthesia Checklist: Patient identified, Emergency Drugs available, Suction available, Patient being monitored and Timeout performed Patient Re-evaluated:Patient Re-evaluated prior to induction Preoxygenation: Pre-oxygenation with 100% oxygen Induction Type: IV induction Placement Confirmation: positive ETCO2 Dental Injury: Teeth and Oropharynx as per pre-operative assessment

## 2023-10-29 NOTE — Progress Notes (Signed)
 Rounding Note    Patient Name: Jeremy [name] Date of Encounter: 10/29/2023  [facility] Cardiologist: Marca Ancona, MD    Subjective   43 year old gentleman with a prior history of aortic valve endocarditis, end-stage renal disease, moderate reduction in LV function who presented yesterday with volume overload.  He has a long history of severe aortic regurgitation.  He was seen by Dr. Maren Beach in 2023 and was instructed to have his teeth evaluated.  He never made the appointment to get his teeth evaluated.  He has a history of becoming very volume overloaded through the weekends.  He admits to eating more salty foods and drinking more fluids than he should during the weekends.  He is doing well  Breathing well   Having his dental extractions tomorrow   He has follow up arranged with Dr. Shirlee Latch on March 4 .      Inpatient Medications    Scheduled Meds:  atorvastatin  80 mg Oral Daily   carvedilol  50 mg Oral BID WC   Chlorhexidine Gluconate Cloth  6 each Topical Q0600   hydrALAZINE  50 mg Oral TID   insulin aspart  0-6 Units Subcutaneous TID WC   insulin glargine-yfgn  15 Units Subcutaneous QHS   lidocaine  1 patch Transdermal Q24H   LORazepam  0.5 mg Intravenous Once   pantoprazole  40 mg Oral Daily   sevelamer carbonate  2,400 mg Oral TID WC   Continuous Infusions:  [START ON 10/30/2023] cefTAZidime (FORTAZ)  IV     [START ON 10/30/2023] vancomycin     PRN Meds: acetaminophen, hydrOXYzine, melatonin, ondansetron   Vital Signs    Vitals:   10/29/23 0920 10/29/23 0927 10/29/23 0940 10/29/23 1055  BP: (!) 108/48 (!) 102/52 (!) 101/58 (!) 113/53  Pulse: 85   99  Resp: (!) 21   19  Temp:    98 F (36.7 C)  TempSrc:    Oral  SpO2: 99%   98%  Weight:      Height:        Intake/Output Summary (Last 24 hours) at 10/29/2023 1530 Last data filed at 10/29/2023 0331 Gross per 24 hour  Intake 500.23 ml  Output 150 ml  Net 350.23 ml      10/28/2023    12:23 PM 10/28/2023    7:10 AM 10/26/2023    8:30 AM  Last 3 Weights  Weight (lbs) 240 lb 251 lb 5.2 oz  --  Weight (kg) 108.863 kg 114 kg  --     Significant value      Telemetry    Sinus tach  - Personally Reviewed  ECG    Nsr  - Personally Reviewed  Physical Exam   GEN: NAD , unlabored breathing  Neck: No JVD Cardiac: RRR, 2-3/6 systolic , diastolic murmur at LSB ,  tachy  Respiratory:clear  GI: Soft, nontender, non-distended  MS: left foot wrapped  Neuro:  nonfocal   Psych: Normal affect   Labs    High Sensitivity Troponin:   Recent Labs  Lab 10/12/23 0436 10/26/23 0335 10/26/23 0637 10/26/23 1605 10/27/23 0335  TROPONINIHS 44* 54* 95* 112* 129*     Chemistry Recent Labs  Lab 10/26/23 0446 10/26/23 0456 10/27/23 0335 10/28/23 0742 10/28/23 1228 10/29/23 0218  NA 140   < > 135 133* 135 137  K 4.7   < > 5.0 3.5 4.0 4.4  CL 101   < > 97* 92* 98 97*  CO2 21*  --  20* 25  --  24  GLUCOSE 184*   < > 165* 133* 109* 108*  BUN 94*   < > 75* 43* 44* 58*  CREATININE 13.28*   < > 11.15* 7.41* 10.00* 10.46*  CALCIUM 9.3  --  8.6* 9.2  --  9.2  PROT 7.8  --   --   --   --   --   ALBUMIN 3.1*  --  2.6* 3.1*  --  2.7*  AST 18  --   --   --   --   --   ALT 15  --   --   --   --   --   ALKPHOS 45  --   --   --   --   --   BILITOT 0.7  --   --   --   --   --   GFRNONAA 4*   < > 5* 9*  --  6*  ANIONGAP 18*  --  18* 16*  --  16*   < > = values in this interval not displayed.    Lipids  Recent Labs  Lab 10/27/23 0335  CHOL 132  TRIG 103  HDL 28*  LDLCALC 83  CHOLHDL 4.7    Hematology Recent Labs  Lab 10/27/23 0335 10/28/23 0742 10/28/23 1228 10/29/23 0218  WBC 10.4 7.6  --  8.9  RBC 4.24 4.71  --  4.06*  HGB 12.0* 12.9* 13.9 11.2*  HCT 37.4* 40.6 41.0 34.9*  MCV 88.2 86.2  --  86.0  MCH 28.3 27.4  --  27.6  MCHC 32.1 31.8  --  32.1  RDW 20.7* 20.2*  --  19.8*  PLT 320 344  --  283   Thyroid No results for input(s): "TSH", "FREET4" in the  last 168 hours.  BNP Recent Labs  Lab 10/26/23 0335  BNP 859.3*    DDimer No results for input(s): "DDIMER" in the last 168 hours.   Radiology    ECHO TEE Result Date: 10/29/2023    TRANSESOPHOGEAL ECHO REPORT   Patient Name:   JESSON FOSKEY Date of Exam: 10/29/2023 Medical Rec #:  [id number]       Height:       77.0 in Accession #:    [id number]      Weight:       240.0 lb Date of Birth:  1981/05/06       BSA:          2.417 m Patient Age:    42 years        BP:           144/63 mmHg Patient Gender: M               HR:           105 bpm. Exam Location:  Inpatient Procedure: 3D Echo, Color Doppler, Cardiac Doppler and Transesophageal Echo            (Both Spectral and Color Flow Doppler were utilized during            procedure). Indications:     Aortic Valve  History:         Patient has prior history of Echocardiogram examinations, most                  recent 10/26/2023.  Sonographer:     Harriette Bouillon RDCS Referring Phys:  [id number] Cyndi Bender Diagnosing Phys: Wilfred Lacy PROCEDURE: The transesophogeal  probe was passed without difficulty through the esophogus of the patient. Sedation performed by different physician. The patient developed no complications during the procedure.  IMPRESSIONS  1. Left ventricular ejection fraction, by estimation, is 35%. The left ventricle has moderately decreased function. The left ventricle demonstrates global hypokinesis. The left ventricular internal cavity size was mildly to moderately dilated.  2. Right ventricular systolic function is mildly reduced. The right ventricular size is normal.  3. No left atrial/left atrial appendage thrombus was detected.  4. No PFO/ASD by color doppler.  5. There is a mobile vegetation that appears calcified on the right cusp, 0.9 cm. There is a smaller mobile vegetation on the left cusp. The right and noncoronary cusps do not fully coapt. I do not see a definite perforation. The aortic valve is tricuspid. Aortic valve regurgitation  is severe. No aortic stenosis is present. Difficult to assess but there does appear to be holodiastolic flow reversal in the proximal descending thoracic aorta.  6. The mitral valve is normal in structure. Trivial mitral valve regurgitation. No evidence of mitral stenosis.  7. The dialysis catheter extends into the right atrium, no vegetation noted.  8. 3D performed of the aortic valve. FINDINGS  Left Ventricle: Left ventricular ejection fraction, by estimation, is 35%. The left ventricle has moderately decreased function. The left ventricle demonstrates global hypokinesis. Strain imaging was not performed. The left ventricular internal cavity size was mildly to moderately dilated. Right Ventricle: The right ventricular size is normal. No increase in right ventricular wall thickness. Right ventricular systolic function is mildly reduced. Left Atrium: Left atrial size was normal in size. No left atrial/left atrial appendage thrombus was detected. Right Atrium: Right atrial size was normal in size. Pericardium: There is no evidence of pericardial effusion. Mitral Valve: The mitral valve is normal in structure. Trivial mitral valve regurgitation. No evidence of mitral valve stenosis. Tricuspid Valve: The tricuspid valve is normal in structure. Tricuspid valve regurgitation is trivial. No evidence of tricuspid stenosis. Aortic Valve: There is a mobile vegetation that appears calcified on the right cusp, 0.9 cm. There is a smaller mobile vegetation on the left cusp. The right and noncoronary cusps do not fully coapt. I do not see a definite perforation. The aortic valve is tricuspid. Aortic valve regurgitation is severe. No aortic stenosis is present. Pulmonic Valve: The pulmonic valve was normal in structure. Pulmonic valve regurgitation is not visualized. Aorta: The aortic root is normal in size and structure. IAS/Shunts: No PFO/ASD by color doppler.  LEFT VENTRICLE PLAX 2D LVIDd:         5.80 cm LVIDs:         4.80 cm  LVOT diam:     2.50 cm LVOT Area:     4.91 cm   SHUNTS Systemic Diam: 2.50 cm Dalton McleanMD Electronically signed by Wilfred Lacy Signature Date/Time: 10/29/2023/9:51:10 AM    Final (Updated)    CT MAXILLOFACIAL WO CONTRAST Result Date: 10/29/2023 CLINICAL DATA:  Preoperative evaluation for surgery tomorrow EXAM: CT MAXILLOFACIAL WITHOUT CONTRAST TECHNIQUE: Multidetector CT imaging of the maxillofacial structures was performed. Multiplanar CT image reconstructions were also generated. RADIATION DOSE REDUCTION: This exam was performed according to the departmental dose-optimization program which includes automated exposure control, adjustment of the mA and/or kV according to patient size and/or use of iterative reconstruction technique. COMPARISON:  None Available. FINDINGS: Osseous: There are no acute displaced fractures. The bilateral lower third molars are impacted (teeth 17, 32). Erosive changes are seen within the bilateral  lower second molars as result (teeth 18, 31), as well as periapical lucencies. Periapical lucencies are seen within the left upper second and third molars (teeth 15, 16). Orbits: Negative. No traumatic or inflammatory finding. Sinuses: Clear. Soft tissues: Soft tissues are unremarkable. Mild bilateral tonsillar enlargement with coarse calcifications. Airway remains patent. Limited intracranial: No significant or unexpected finding. IMPRESSION: 1. Impacted lower third molars bilaterally, with erosive changes of the lower second molars bilaterally as result. Periapical lucencies involving the bilateral lower second molars and left upper second molar. 2. No acute fractures. Electronically Signed   By: Sharlet Salina M.D.   On: 10/29/2023 12:48   EP STUDY Result Date: 10/29/2023 See surgical note for result.  DG Foot Complete Left Result Date: 10/28/2023 CLINICAL DATA:  Status post partial third ray resection. EXAM: LEFT FOOT - COMPLETE 3+ VIEW COMPARISON:  Left foot radiographs  dated 10/20/2023. MRI left foot dated 10/26/2023. FINDINGS: Status post interval partial third ray resection through the proximal third metatarsal shaft with antibiotic beads in place at the level of the transsection margin. 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 No acute osseous abnormality identified. Wound VAC in place with postsurgical soft tissue swelling and air. Vascular calcifications are noted. IMPRESSION: 1. Status post interval partial third ray resection through the proximal third metatarsal shaft with antibiotic beads in place. 2. Expected postsurgical soft tissue swelling and air with a wound VAC in place. Electronically Signed   By: Hart Robinsons M.D.   On: 10/28/2023 17:00    Cardiac Studies      Patient Profile     43 y.o. male    Assessment & Plan    1.  Severe aortic insufficiency:    TEE today showed severe AI,  old endocarditis   He has been stable while here in the hospital   Has had foot surgery .  Having his dental procedure tomorrow   He has an appt with Dr. Shirlee Latch on March 4.          For questions or updates, please contact  [facility] Please consult [email or web address] for contact info under        Signed, Kristeen Miss, MD  10/29/2023, 3:30 PM

## 2023-10-29 NOTE — Anesthesia Postprocedure Evaluation (Signed)
 Anesthesia Post Note  Patient: Jeremy [name]  Procedure(s) Performed: TRANSESOPHAGEAL ECHOCARDIOGRAM     Patient location during evaluation: Endoscopy Anesthesia Type: MAC Level of consciousness: awake and alert Pain management: pain level controlled Vital Signs Assessment: post-procedure vital signs reviewed and stable Respiratory status: spontaneous breathing, nonlabored ventilation, respiratory function stable and patient connected to nasal cannula oxygen Cardiovascular status: blood pressure returned to baseline and stable Postop Assessment: no apparent nausea or vomiting Anesthetic complications: no  No notable events documented.  Last Vitals:  Vitals:   10/29/23 0927 10/29/23 0940  BP: (!) 102/52 (!) 101/58  Pulse:    Resp:    Temp:    SpO2:      Last Pain:  Vitals:   10/29/23 0951  TempSrc:   PainSc: 6                  Trevor Iha

## 2023-10-29 NOTE — CV Procedure (Signed)
 Procedure: TEE  Indication: Aortic insufficiency  Sedation: Per anesthesiology.   Findings: Please see echo section for full report.  There is severe aortic insufficiency and evidence for prior endocarditis.   No complications.   Jeremy [name] 10/29/2023 9:03 AM

## 2023-10-29 NOTE — Interval H&P Note (Signed)
 History and Physical Interval Note:  10/29/2023 8:24 AM  Jeremy [name]  has presented today for surgery, with the diagnosis of Bacteremia, veg on AV.  The various methods of treatment have been discussed with the patient and family. After consideration of risks, benefits and other options for treatment, the patient has consented to  Procedure(s): TRANSESOPHAGEAL ECHOCARDIOGRAM (N/A) as a surgical intervention.  The patient's history has been reviewed, patient examined, no change in status, stable for surgery.  I have reviewed the patient's chart and labs.  Questions were answered to the patient's satisfaction.     Nicko Daher [employer]

## 2023-10-29 NOTE — Progress Notes (Addendum)
 [facility] Progress Note   Subjective: Seen in room. No C/Os. TEE today. Going for dental extractions tomorrow. Will need to hold heparin, schedule HD around dental extractions.    Objective Vitals:   10/29/23 0920 10/29/23 0927 10/29/23 0940 10/29/23 1055  BP: (!) 108/48 (!) 102/52 (!) 101/58 (!) 113/53  Pulse: 85   99  Resp: (!) 21   19  Temp:    98 F (36.7 C)  TempSrc:    Oral  SpO2: 99%   98%  Weight:      Height:       Physical Exam General: Pleasant chronically ill appearing male in NAD Heart:S1,S2 2/6 systolic M 1/6 diastolic M RRR SR on monitor.  Lungs: CTAB A/P Abdomen: NABS, NT Extremities: Wound L foot dressed No LE edema Dialysis Access: [facility] drsg intact   Additional Objective Labs: Basic Metabolic Panel: Recent Labs  Lab 10/27/23 0335 10/28/23 0742 10/28/23 1228 10/29/23 0218  NA 135 133* 135 137  K 5.0 3.5 4.0 4.4  CL 97* 92* 98 97*  CO2 20* 25  --  24  GLUCOSE 165* 133* 109* 108*  BUN 75* 43* 44* 58*  CREATININE 11.15* 7.41* 10.00* 10.46*  CALCIUM 8.6* 9.2  --  9.2  PHOS 7.0* 4.9*  --  8.7*   Liver Function Tests: Recent Labs  Lab 10/26/23 0446 10/27/23 0335 10/28/23 0742 10/29/23 0218  AST 18  --   --   --   ALT 15  --   --   --   ALKPHOS 45  --   --   --   BILITOT 0.7  --   --   --   PROT 7.8  --   --   --   ALBUMIN 3.1* 2.6* 3.1* 2.7*   No results for input(s): "LIPASE", "AMYLASE" in the last 168 hours. CBC: Recent Labs  Lab 10/26/23 0335 10/26/23 0456 10/26/23 0649 10/27/23 0335 10/28/23 0742 10/28/23 1228 10/29/23 0218  WBC 15.1*  --  12.8* 10.4 7.6  --  8.9  NEUTROABS 13.3*  --   --  7.4 4.4  --   --   HGB 14.0   < > 13.3 12.0* 12.9* 13.9 11.2*  HCT 44.8   < > 42.0 37.4* 40.6 41.0 34.9*  MCV 87.8  --  87.9 88.2 86.2  --  86.0  PLT 372  --  381 320 344  --  283   < > = values in this interval not displayed.   Blood Culture    Component Value Date/Time   SDES LEFT FOOT 10/28/2023 1419    SPECREQUEST  SWABS OF 3RD METATARSAL SAMPLE B 10/28/2023 1419   CULT  10/28/2023 1419    NO GROWTH < 24 HOURS Performed at [facility], [address] [place], [place] [address]    REPTSTATUS PENDING 10/28/2023 1419    Cardiac Enzymes: No results for input(s): "CKTOTAL", "CKMB", "CKMBINDEX", "TROPONINI" in the last 168 hours. CBG: Recent Labs  Lab 10/28/23 1451 10/28/23 1513 10/28/23 2113 10/29/23 0612 10/29/23 1110  GLUCAP 61* 112* 195* 149* 137*   Iron Studies: No results for input(s): "IRON", "TIBC", "TRANSFERRIN", "FERRITIN" in the last 72 hours. @lablastinr3 @ Studies/Results: CT MAXILLOFACIAL WO CONTRAST Result Date: 10/29/2023 CLINICAL DATA:  Preoperative evaluation for surgery tomorrow EXAM: CT MAXILLOFACIAL WITHOUT CONTRAST TECHNIQUE: Multidetector CT imaging of the maxillofacial structures was performed. Multiplanar CT image reconstructions were also generated. RADIATION DOSE REDUCTION: This exam was performed according to  the departmental dose-optimization program which includes automated exposure control, adjustment of the mA and/or kV according to patient size and/or use of iterative reconstruction technique. COMPARISON:  None Available. FINDINGS: Osseous: There are no acute displaced fractures. The bilateral lower third molars are impacted (teeth 17, 32). Erosive changes are seen within the bilateral lower second molars as result (teeth 18, 31), as well as periapical lucencies. Periapical lucencies are seen within the left upper second and third molars (teeth 15, 16). Orbits: Negative. No traumatic or inflammatory finding. Sinuses: Clear. Soft tissues: Soft tissues are unremarkable. Mild bilateral tonsillar enlargement with coarse calcifications. Airway remains patent. Limited intracranial: No significant or unexpected finding. IMPRESSION: 1. Impacted lower third molars bilaterally, with erosive changes of the lower second molars bilaterally as result. Periapical  lucencies involving the bilateral lower second molars and left upper second molar. 2. No acute fractures. Electronically Signed   By: Sharlet Salina M.D.   On: 10/29/2023 12:48   ECHO TEE Result Date: 10/29/2023    TRANSESOPHOGEAL ECHO REPORT   Patient Name:   ABOU STERKEL Date of Exam: 10/29/2023 Medical Rec #:  [id number]       Height:       77.0 in Accession #:    [id number]      Weight:       240.0 lb Date of Birth:  04-Aug-1981       BSA:          2.417 m Patient Age:    42 years        BP:           144/63 mmHg Patient Gender: M               HR:           105 bpm. Exam Location:  Inpatient Procedure: 3D Echo, Color Doppler, Cardiac Doppler and Transesophageal Echo            (Both Spectral and Color Flow Doppler were utilized during            procedure). Indications:    Aortic Valve  History:        Patient has prior history of Echocardiogram examinations, most                 recent 10/26/2023.  Sonographer:    Harriette Bouillon RDCS Referring Phys: [id number] Cyndi Bender PROCEDURE: The transesophogeal probe was passed without difficulty through the esophogus of the patient. Sedation performed by different physician. The patient developed no complications during the procedure.  IMPRESSIONS  1. Left ventricular ejection fraction, by estimation, is 35%. The left ventricle has moderately decreased function. The left ventricle demonstrates global hypokinesis. The left ventricular internal cavity size was mildly to moderately dilated.  2. Right ventricular systolic function is mildly reduced. The right ventricular size is normal.  3. No left atrial/left atrial appendage thrombus was detected.  4. No PFO/ASD by color doppler.  5. There is a mobile vegetation that appears calcified on the right cusp, 0.9 cm. There is a smaller mobile vegetation on the left cusp. The right and noncoronary cusps do not fully coapt. I do not see a definite perforation. The aortic valve is tricuspid. Aortic valve regurgitation is severe. No  aortic stenosis is present. Difficult to assess but there does appear to be holodiastolic flow reversal in the proximal descending thoracic aorta.  6. The mitral valve is normal in structure. Trivial mitral valve regurgitation. No evidence of mitral stenosis.  7.  The dialysis catheter extends into the right atrium, no vegetation noted.  8. 3D performed of the aortic valve. FINDINGS  Left Ventricle: Left ventricular ejection fraction, by estimation, is 35%. The left ventricle has moderately decreased function. The left ventricle demonstrates global hypokinesis. Strain imaging was not performed. The left ventricular internal cavity size was mildly to moderately dilated. Right Ventricle: The right ventricular size is normal. No increase in right ventricular wall thickness. Right ventricular systolic function is mildly reduced. Left Atrium: Left atrial size was normal in size. No left atrial/left atrial appendage thrombus was detected. Right Atrium: Right atrial size was normal in size. Pericardium: There is no evidence of pericardial effusion. Mitral Valve: The mitral valve is normal in structure. Trivial mitral valve regurgitation. No evidence of mitral valve stenosis. Tricuspid Valve: The tricuspid valve is normal in structure. Tricuspid valve regurgitation is trivial. No evidence of tricuspid stenosis. Aortic Valve: There is a mobile vegetation that appears calcified on the right cusp, 0.9 cm. There is a smaller mobile vegetation on the left cusp. The right and noncoronary cusps do not fully coapt. I do not see a definite perforation. The aortic valve is tricuspid. Aortic valve regurgitation is severe. No aortic stenosis is present. Pulmonic Valve: The pulmonic valve was normal in structure. Pulmonic valve regurgitation is not visualized. Aorta: The aortic root is normal in size and structure. IAS/Shunts: No PFO/ASD by color doppler. Additional Comments: 3D imaging was not performed. LEFT VENTRICLE PLAX 2D LVIDd:          5.80 cm LVIDs:         4.80 cm LVOT diam:     2.50 cm LVOT Area:     4.91 cm   SHUNTS Systemic Diam: 2.50 cm Dalton McleanMD Electronically signed by Wilfred Lacy Signature Date/Time: 10/29/2023/9:51:10 AM    Final    EP STUDY Result Date: 10/29/2023 See surgical note for result.  DG Foot Complete Left Result Date: 10/28/2023 CLINICAL DATA:  Status post partial third ray resection. EXAM: LEFT FOOT - COMPLETE 3+ VIEW COMPARISON:  Left foot radiographs dated 10/20/2023. MRI left foot dated 10/26/2023. FINDINGS: Status post interval partial third ray resection through the proximal third metatarsal shaft with antibiotic beads in place at the level of the transsection margin. 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 No acute osseous abnormality identified. Wound VAC in place with postsurgical soft tissue swelling and air. Vascular calcifications are noted. IMPRESSION: 1. Status post interval partial third ray resection through the proximal third metatarsal shaft with antibiotic beads in place. 2. Expected postsurgical soft tissue swelling and air with a wound VAC in place. Electronically Signed   By: Hart Robinsons M.D.   On: 10/28/2023 17:00   Medications:  [START ON 10/30/2023] cefTAZidime (FORTAZ)  IV     [START ON 10/30/2023] vancomycin      atorvastatin  80 mg Oral Daily   carvedilol  50 mg Oral BID WC   Chlorhexidine Gluconate Cloth  6 each Topical Q0600   hydrALAZINE  50 mg Oral TID   insulin aspart  0-6 Units Subcutaneous TID WC   insulin glargine-yfgn  15 Units Subcutaneous QHS   lidocaine  1 patch Transdermal Q24H   LORazepam  0.5 mg Intravenous Once   pantoprazole  40 mg Oral Daily   sevelamer carbonate  2,400 mg Oral TID WC     HD Orders: [facility] MWF 4 hrs 180NRe 500/800 113.5 kg TDC - Heparin 5000 units IV initial bolus Heparin 4000 units  IV mid run - Hectorol 7 mcg IV three times per week - Mircera 50 mcg IV q 4 weeks (last dose 09/30/2023 next dose due  10/28/2023)     Assessment/ Plan: Acute hypoxic resp failure - w/ sig pulm edema and volume overload, on 10 L [place] O2 on admit.  Now under OP EDW with hypotension noted.Torsemide has been Dc'd.   UF as tolerated.  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 Cardiology consulted. Severe AR-Needs valve replacement. Dental extractions scheduled for tomorrow. . Per primary.   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 ABX will start post op. TEE today more compatible with old endocarditis. Will require Vancomycin + Ceftaz with HD EOT 12/09/2023 three times per week at OP center. Asking ID regarding concerns about TDC.  ESRD - on HD MWF. Next HD 10/30/2023. Hold heparin. Schedule around dental extractions.  HTN - BP better controlled today after volume removed. Continue home meds. Added Torsemide on non HD days.  Volume - as above, sig vol overloaded. Get wt's post HD.  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  Secondary hyperparathyroidism - Continue VDRA, sevelamer binders.  DMT2   Heliodoro Domagalski H. Dewight Catino NP-C 10/29/2023, 2:17 PM  [employer] [phone]

## 2023-10-29 NOTE — Progress Notes (Signed)
  Subjective:  Patient ID: Jeremy [name], male    DOB: Dec 05, 1980,  MRN: [id number]   DOS: 10/28/2023 Procedure:  1.  Irrigation debridement with partial resection third metatarsal, left foot 2.  Bone biopsy fourth metatarsal head and fourth proximal phalanx, left foot 3.  Insertion dissolvable calcium sulfate antibiotic beads, left foot 4.  Application negative pressure wound therapy, 5.5 x 2 x 2 cm, left foot  43 y.o. male seen for post op check.  Had bleeding issues last evening and overnight. He reports dressing last changed around midnight that he can recall. Says he can feel his foot but not painful at this time. Aware of findings and plan going forward. Advised him no weight on foot and elevate while in bed.  Review of Systems: Negative except as noted in the HPI. Denies N/V/F/Ch.   Objective:   Vitals:   10/29/23 0104 10/29/23 0407  BP: (!) 115/52 125/65  Pulse: 99 100  Resp: 20 18  Temp: 98 F (36.7 C) 98.3 F (36.8 C)  SpO2: 95% 96%   Body mass index is 28.46 kg/m. Constitutional Well developed. Well nourished.  Vascular Foot warm and well perfused. Capillary refill normal to all digits.   No calf pain with palpation  Neurologic Normal speech. Oriented to person, place, and time. Epicritic sensation absent to forefoot  Dermatologic Removed dressing, mild oozing from proximal end of incision with hematoma present. Wound vac sponge in place. Cleansed foot and reapplied bulky gauze dressing.   Orthopedic: Edema and prior third toe amputation fourth toe with edema   Radiographs: Status post interval partial third ray resection through the proximal third metatarsal shaft with antibiotic beads, soft tissue emphysema with wound VAC  Pathology: Third metatarsal proximal margin-pending, fourth metatarsal head bone biopsy-pending, fourth proximal phalanx-pending  Micro: Third metatarsal bone culture pending, deep tissue swab culture pending  Assessment:   Recurrent  osteomyelitis of third metatarsal status post prior partial third ray amputation now status post interval proximal third metatarsal resection, antibiotic beads bone biopsy and wound VAC  Plan:  Patient was evaluated and treated and all questions answered.  POD # 1 s/p above procedures -Progressing as would be expected postoperatively unfortunately wound VAC failed due to location of the back and bleeding from the surgical site.  Discontinue. -Patient will require repeat debridement irrigation possible graft application possible delayed closure hopefully tomorrow. Plan to coordinate to do combo case with Dr. Barbette Merino tmrw am.  -Of note in regards to postoperative bleeding, per [facility] patient was given his heparin yesterday at 1549 which was immediately postoperative on patient with wound vac.  I had requested anticoagulation held prior to surgery and was not asked when to resume postoperatively.  Subsequent doses of heparin were held due to bleeding that occurred following admin yesterday afternoon.  -XR: Expected postoperative changes -WB Status: Nonweightbearing in soft dressing, elevate extremity. -Sutures: Remain intact until repeat OR. -Medications/ABX: Antibiotics per infectious disease and appreciate recommendations -Foot redressed.         Corinna Gab, DPM [facility] / [facility]

## 2023-10-29 NOTE — Progress Notes (Signed)
 HD#3 SUBJECTIVE:  Patient Summary: Jeremy [name] is a 43 y.o. with a pertinent PMH of ESRD on dialysis Monday, Wednesday, Friday, type 2 diabetes mellitus, HFpEF, history of diabetic foot infection who presents to the emergency department concerns of shortness of breath .   Overnight Events: NAEO   Interim History: Patient was evaluated at bedside.  Denies any chest pain, troubles breathing, fever, shortness of breath, or any other signs or symptoms.  Patient counseled on plan for the next few days.  No concerns at this time  OBJECTIVE:  Vital Signs: Vitals:   10/28/23 2237 10/29/23 0104 10/29/23 0407 10/29/23 0731  BP: 130/61 (!) 115/52 125/65 (!) 161/58  Pulse: 98 99 100 (!) 103  Resp: 18 20 18  (!) 26  Temp:  98 F (36.7 C) 98.3 F (36.8 C) 98.4 F (36.9 C)  TempSrc:  Oral Oral Temporal  SpO2: 94% 95% 96% 100%  Weight:      Height:       Supplemental O2: Room Air  Filed Weights   10/26/23 0325 10/28/23 0710 10/28/23 1223  Weight: 108.9 kg (S) 114 kg 108.9 kg     Intake/Output Summary (Last 24 hours) at 10/29/2023 0809 Last data filed at 10/29/2023 0331 Gross per 24 hour  Intake 1000.23 ml  Output 160 ml  Net 840.23 ml   Net IO Since Admission: -4,252.87 mL [10/29/23 0809]  CBC    Component Value Date/Time   WBC 8.9 10/29/2023 0218   RBC 4.06 (L) 10/29/2023 0218   HGB 11.2 (L) 10/29/2023 0218   HCT 34.9 (L) 10/29/2023 0218   HCT 23.7 (L) 05/30/2016 1157   PLT 283 10/29/2023 0218   MCV 86.0 10/29/2023 0218   MCH 27.6 10/29/2023 0218   MCHC 32.1 10/29/2023 0218   RDW 19.8 (H) 10/29/2023 0218   LYMPHSABS 2.0 10/28/2023 0742   MONOABS 0.8 10/28/2023 0742   EOSABS 0.3 10/28/2023 0742   BASOSABS 0.1 10/28/2023 0742   CMP     Component Value Date/Time   NA 137 10/29/2023 0218   NA 141 10/15/2020 1015   K 4.4 10/29/2023 0218   CL 97 (L) 10/29/2023 0218   CO2 24 10/29/2023 0218   GLUCOSE 108 (H) 10/29/2023 0218   BUN 58 (H) 10/29/2023 0218   BUN 62 (H)  10/15/2020 1015   CREATININE 10.46 (H) 10/29/2023 0218   CALCIUM 9.2 10/29/2023 0218   PROT 7.8 10/26/2023 0446   ALBUMIN 2.7 (L) 10/29/2023 0218   AST 18 10/26/2023 0446   ALT 15 10/26/2023 0446   ALKPHOS 45 10/26/2023 0446   BILITOT 0.7 10/26/2023 0446   GFR 109.39 03/09/2015 1145   GFRNONAA 6 (L) 10/29/2023 0218   Physical Exam Constitutional:      Appearance: He is well-developed.  HENT:     Head: Normocephalic.  Cardiovascular:     Rate and Rhythm: Regular rhythm. Tachycardia present.  Pulmonary:     Effort: Pulmonary effort is normal.     Breath sounds: Normal breath sounds.  Abdominal:     General: Bowel sounds are normal.     Palpations: Abdomen is soft.  Musculoskeletal:     Comments: 4th toe amputation on R. L foot in clean bandaged wrapping  Neurological:     General: No focal deficit present.     Mental Status: He is alert.    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 with a pertinent PMH of ESRD on dialysis Monday, Wednesday, Friday, type 2 diabetes mellitus, HFpEF, history of diabetic foot infection who presents to the emergency department concerns of shortness of breath .   Plan: #Acute hypoxemic respiratory failure #Aortic insufficiency #Flash pulmonary edema, improving #HFpEF exacerbation Cardiology and oral surgery following.  He's been slightly tachycardic in the 100s which is likely from holding his Coreg yesterday as patient had low/low-normal blood pressures yesterday.  Completed TEE this morning that noted severe aortic insufficiency and  evidence for prior endocarditis. Dr. Barbette Merino for dental extraction but will need CT today to evaluate teeth prior to surgery - Plan for dental extraction tomorrow (n.p.o. at midnight) - S/P CT maxillofacial - Follow-up cardiology recommendations  #Concern for osteomyelitis of third metatarsal #S/P I&D w/ partial resection of third metatarsal, left foot Followed by ID and podiatry. Patient has been afebrile with no leukocytosis. Blood cultures remain negative. Dr. Annamary Rummage completed procedure yesterday in which he was able to partial resection and debride the patient's left third metatarsal.  Noted to have had bleeding issues last evening and overnight.  Transitioned to vanc + Unasyn post-operative.  Unasyn switched to ceftazidime per ID.  After speaking with ID, planning for patient to receive antibiotics with HD so he does not require a PICC line. - Follow-up ID-appreciate recommendations - Continue vanc + ceftazidime - Stop Unasyn  #Hypertension, resolved  Continue Coreg 50 mg twice daily and hydralazine 50 mg 3 times daily  #ESRD on dialysis Monday/Wednesday/Friday Continue home Lokelma and Renvela   #Type 2 diabetes mellitus One episode of hypoglycemia yesterday at 21 likely from patient being NPO. Will continue SSI. Decreased to Lantus 15 units daily due to patient being NPO and likely will be NPO for teeth extraction tomorrow.   #GERD Continue Protonix 40 mg daily  Best Practice: Diet: N.p.o. at midnight IVF: None VTE: heparin injection 5,000 Units Start: 10/26/23 1400 Code: Full AB: Will start Unasyn plus Vanco stop Therapy Recs: TBD Family Contact: Sister, to be notified. DISPO: Anticipated discharge  TBD  to Home pending  toe procedures and antibiotic regimen .  Signature: Morrie Sheldon, MD Internal Medicine Resident, PGY-1 [facility]  Pager: [phone]  Please contact the on call pager after 5 pm and on weekends at [phone].

## 2023-10-29 NOTE — Progress Notes (Signed)
 RN and Tech went to change patients bed d/t blood on sheets and blankets from wound bleeding today. RN and Tech helped patient transfer from bed to chair to change bed, while patient was sitting in chair foots started bleeding heavily. Patient was moved back to clean bed and most of dressing was removed. There was gauze close to the surgical site that had a nice clot underneath so this was kept in place. Surgical site still bleeding, new dressing was applied, Gauze, ABD pads, kerlex and ace wrap.   2100: Checked on patients dressing. Blood had soaked through dressings and once again changed with same as above. Bleeding has slowed. Heparin was held.

## 2023-10-30 ENCOUNTER — Other Ambulatory Visit: Payer: Self-pay

## 2023-10-30 ENCOUNTER — Inpatient Hospital Stay (HOSPITAL_COMMUNITY): Payer: 59 | Admitting: Anesthesiology

## 2023-10-30 ENCOUNTER — Encounter (HOSPITAL_COMMUNITY): Payer: Self-pay | Admitting: Internal Medicine

## 2023-10-30 ENCOUNTER — Encounter (HOSPITAL_COMMUNITY)
Admission: EM | Disposition: A | Discharge status: Home / Self Care | Payer: Self-pay | Source: Home / Self Care | Attending: Internal Medicine

## 2023-10-30 DIAGNOSIS — I132 Hypertensive heart and chronic kidney disease with heart failure and with stage 5 chronic kidney disease, or end stage renal disease: Secondary | ICD-10-CM

## 2023-10-30 DIAGNOSIS — N186 End stage renal disease: Secondary | ICD-10-CM

## 2023-10-30 DIAGNOSIS — M86172 Other acute osteomyelitis, left ankle and foot: Secondary | ICD-10-CM | POA: Diagnosis not present

## 2023-10-30 DIAGNOSIS — I5042 Chronic combined systolic (congestive) and diastolic (congestive) heart failure: Secondary | ICD-10-CM

## 2023-10-30 DIAGNOSIS — K029 Dental caries, unspecified: Secondary | ICD-10-CM

## 2023-10-30 DIAGNOSIS — E1122 Type 2 diabetes mellitus with diabetic chronic kidney disease: Secondary | ICD-10-CM | POA: Diagnosis not present

## 2023-10-30 DIAGNOSIS — J9601 Acute respiratory failure with hypoxia: Secondary | ICD-10-CM | POA: Diagnosis not present

## 2023-10-30 DIAGNOSIS — Z992 Dependence on renal dialysis: Secondary | ICD-10-CM | POA: Diagnosis not present

## 2023-10-30 HISTORY — PX: TOOTH EXTRACTION: SHX859

## 2023-10-30 HISTORY — PX: IRRIGATION AND DEBRIDEMENT FOOT: SHX6602

## 2023-10-30 LAB — POCT I-STAT, CHEM 8
BUN: 72 mg/dL — ABNORMAL HIGH (ref 6–20)
Calcium, Ion: 1.11 mmol/L — ABNORMAL LOW (ref 1.15–1.40)
Chloride: 101 mmol/L (ref 98–111)
Creatinine, Ser: 15.4 mg/dL — ABNORMAL HIGH (ref 0.61–1.24)
Glucose, Bld: 116 mg/dL — ABNORMAL HIGH (ref 70–99)
HCT: 36 % — ABNORMAL LOW (ref 39.0–52.0)
Hemoglobin: 12.2 g/dL — ABNORMAL LOW (ref 13.0–17.0)
Potassium: 5 mmol/L (ref 3.5–5.1)
Sodium: 136 mmol/L (ref 135–145)
TCO2: 24 mmol/L (ref 22–32)

## 2023-10-30 LAB — CBC
HCT: 33.5 % — ABNORMAL LOW (ref 39.0–52.0)
Hemoglobin: 10.9 g/dL — ABNORMAL LOW (ref 13.0–17.0)
MCH: 27.9 pg (ref 26.0–34.0)
MCHC: 32.5 g/dL (ref 30.0–36.0)
MCV: 85.9 fL (ref 80.0–100.0)
Platelets: 296 10*3/uL (ref 150–400)
RBC: 3.9 MIL/uL — ABNORMAL LOW (ref 4.22–5.81)
RDW: 19.7 % — ABNORMAL HIGH (ref 11.5–15.5)
WBC: 8.8 10*3/uL (ref 4.0–10.5)
nRBC: 0 % (ref 0.0–0.2)

## 2023-10-30 LAB — GLUCOSE, CAPILLARY
Glucose-Capillary: 124 mg/dL — ABNORMAL HIGH (ref 70–99)
Glucose-Capillary: 132 mg/dL — ABNORMAL HIGH (ref 70–99)

## 2023-10-30 LAB — RENAL FUNCTION PANEL
Albumin: 2.9 g/dL — ABNORMAL LOW (ref 3.5–5.0)
Anion gap: 15 (ref 5–15)
BUN: 80 mg/dL — ABNORMAL HIGH (ref 6–20)
CO2: 24 mmol/L (ref 22–32)
Calcium: 9.2 mg/dL (ref 8.9–10.3)
Chloride: 98 mmol/L (ref 98–111)
Creatinine, Ser: 13.19 mg/dL — ABNORMAL HIGH (ref 0.61–1.24)
GFR, Estimated: 4 mL/min — ABNORMAL LOW (ref >60)
Glucose, Bld: 111 mg/dL — ABNORMAL HIGH (ref 70–99)
Phosphorus: 9.2 mg/dL — ABNORMAL HIGH (ref 2.5–4.6)
Potassium: 4.8 mmol/L (ref 3.5–5.1)
Sodium: 137 mmol/L (ref 135–145)

## 2023-10-30 LAB — VANCOMYCIN, RANDOM: Vancomycin Rm: 29 ug/mL

## 2023-10-30 LAB — SURGICAL PATHOLOGY

## 2023-10-30 SURGERY — DENTAL RESTORATION/EXTRACTIONS
Anesthesia: General | Site: Mouth

## 2023-10-30 SURGERY — IRRIGATION AND DEBRIDEMENT WOUND
Anesthesia: Choice | Laterality: Left

## 2023-10-30 MED ORDER — ANTICOAGULANT SODIUM CITRATE 4% (200MG/5ML) IV SOLN: 5.0000 mL | Status: DC | PRN

## 2023-10-30 MED ORDER — PROPOFOL 10 MG/ML IV BOLUS
INTRAVENOUS | Status: DC | PRN
Start: 2023-10-30 — End: 2023-10-30
  Administered 2023-10-30: 200 mg via INTRAVENOUS

## 2023-10-30 MED ORDER — DEXAMETHASONE SODIUM PHOSPHATE 10 MG/ML IJ SOLN
INTRAMUSCULAR | Status: AC
  Filled 2023-10-30: qty 1

## 2023-10-30 MED ORDER — ALTEPLASE 2 MG IJ SOLR: 2.0000 mg | Freq: Once | INTRAMUSCULAR | Status: DC | PRN

## 2023-10-30 MED ORDER — MIDAZOLAM HCL 2 MG/2ML IJ SOLN
INTRAMUSCULAR | Status: AC
  Filled 2023-10-30: qty 2

## 2023-10-30 MED ORDER — THROMBIN 20000 UNITS EX KIT
PACK | CUTANEOUS | Status: AC
  Filled 2023-10-30: qty 1

## 2023-10-30 MED ORDER — DEXAMETHASONE SODIUM PHOSPHATE 10 MG/ML IJ SOLN
INTRAMUSCULAR | Status: DC | PRN
  Administered 2023-10-30: 10 mg via INTRAVENOUS

## 2023-10-30 MED ORDER — SCOPOLAMINE 1 MG/3DAYS TD PT72
1.0000 | MEDICATED_PATCH | Freq: Once | TRANSDERMAL | Status: DC
  Administered 2023-10-30: 1.5 mg via TRANSDERMAL

## 2023-10-30 MED ORDER — HEPARIN SODIUM (PORCINE) 1000 UNIT/ML IJ SOLN: 4000.0000 [IU] | Freq: Once | INTRAMUSCULAR | Status: DC

## 2023-10-30 MED ORDER — SODIUM CHLORIDE 0.9 % IR SOLN
Status: DC | PRN
  Administered 2023-10-30: 200 mL

## 2023-10-30 MED ORDER — LIDOCAINE 2% (20 MG/ML) 5 ML SYRINGE
INTRAMUSCULAR | Status: AC
  Filled 2023-10-30: qty 5

## 2023-10-30 MED ORDER — EPHEDRINE 5 MG/ML INJ
INTRAVENOUS | Status: AC
  Filled 2023-10-30: qty 5

## 2023-10-30 MED ORDER — HYDROMORPHONE HCL 2 MG PO TABS: 1.0000 mg | ORAL_TABLET | Freq: Three times a day (TID) | ORAL | 0 refills | Status: DC | PRN

## 2023-10-30 MED ORDER — SODIUM CHLORIDE 0.9 % IR SOLN
Status: DC | PRN
  Administered 2023-10-30: 3000 mL

## 2023-10-30 MED ORDER — MIDAZOLAM HCL 2 MG/2ML IJ SOLN
INTRAMUSCULAR | Status: DC | PRN
  Administered 2023-10-30: 2 mg via INTRAVENOUS

## 2023-10-30 MED ORDER — CHLORHEXIDINE GLUCONATE 0.12 % MT SOLN
15.0000 mL | Freq: Once | OROMUCOSAL | Status: AC
  Administered 2023-10-30: 15 mL via OROMUCOSAL
  Filled 2023-10-30: qty 15

## 2023-10-30 MED ORDER — CHLORHEXIDINE GLUCONATE CLOTH 2 % EX PADS
6.0000 | MEDICATED_PAD | Freq: Once | CUTANEOUS | Status: AC
  Administered 2023-10-30: 6 via TOPICAL

## 2023-10-30 MED ORDER — THROMBIN (RECOMBINANT) 20000 UNITS EX SOLR
CUTANEOUS | Status: AC
  Filled 2023-10-30: qty 20000

## 2023-10-30 MED ORDER — PHENYLEPHRINE HCL-NACL 20-0.9 MG/250ML-% IV SOLN
INTRAVENOUS | Status: DC | PRN
  Administered 2023-10-30: 25 ug/min via INTRAVENOUS

## 2023-10-30 MED ORDER — FENTANYL CITRATE (PF) 250 MCG/5ML IJ SOLN
INTRAMUSCULAR | Status: AC
  Filled 2023-10-30: qty 5

## 2023-10-30 MED ORDER — SCOPOLAMINE 1 MG/3DAYS TD PT72
MEDICATED_PATCH | TRANSDERMAL | Status: DC
Start: 2023-10-30 — End: 2023-10-30
  Filled 2023-10-30: qty 1

## 2023-10-30 MED ORDER — CEFTAZIDIME IV (FOR PTA / DISCHARGE USE ONLY): 1.0000 g | INTRAVENOUS | Status: AC

## 2023-10-30 MED ORDER — CHLORHEXIDINE GLUCONATE CLOTH 2 % EX PADS: 6.0000 | MEDICATED_PAD | Freq: Once | CUTANEOUS | Status: DC

## 2023-10-30 MED ORDER — VANCOMYCIN HCL 500 MG IV SOLR
INTRAVENOUS | Status: AC
  Filled 2023-10-30: qty 10

## 2023-10-30 MED ORDER — OXYCODONE-ACETAMINOPHEN 5-325 MG PO TABS
1.0000 | ORAL_TABLET | ORAL | Status: DC | PRN
  Administered 2023-10-30 (×2): 2 via ORAL
  Filled 2023-10-30 (×2): qty 2

## 2023-10-30 MED ORDER — BUPIVACAINE HCL (PF) 0.5 % IJ SOLN
INTRAMUSCULAR | Status: AC
  Filled 2023-10-30: qty 30

## 2023-10-30 MED ORDER — ONDANSETRON HCL 4 MG/2ML IJ SOLN
INTRAMUSCULAR | Status: DC | PRN
  Administered 2023-10-30: 4 mg via INTRAVENOUS

## 2023-10-30 MED ORDER — ROCURONIUM BROMIDE 10 MG/ML (PF) SYRINGE
PREFILLED_SYRINGE | INTRAVENOUS | Status: DC | PRN
Start: 2023-10-30 — End: 2023-10-30
  Administered 2023-10-30: 50 mg via INTRAVENOUS

## 2023-10-30 MED ORDER — EPHEDRINE SULFATE-NACL 50-0.9 MG/10ML-% IV SOSY
PREFILLED_SYRINGE | INTRAVENOUS | Status: DC | PRN
  Administered 2023-10-30 (×2): 5 mg via INTRAVENOUS
  Administered 2023-10-30: 10 mg via INTRAVENOUS

## 2023-10-30 MED ORDER — FENTANYL CITRATE (PF) 100 MCG/2ML IJ SOLN
INTRAMUSCULAR | Status: AC
  Administered 2023-10-30: 25 ug via INTRAVENOUS
  Filled 2023-10-30: qty 2

## 2023-10-30 MED ORDER — VANCOMYCIN IV (FOR PTA / DISCHARGE USE ONLY): 1000.0000 mg | INTRAVENOUS | Status: AC

## 2023-10-30 MED ORDER — HEPARIN SODIUM (PORCINE) 1000 UNIT/ML IJ SOLN
2000.0000 [IU] | Freq: Once | INTRAMUSCULAR | Status: DC
Start: 2023-10-30 — End: 2023-10-30

## 2023-10-30 MED ORDER — VANCOMYCIN HCL 750 MG/150ML IV SOLN
750.0000 mg | INTRAVENOUS | Status: DC
  Administered 2023-10-30: 750 mg via INTRAVENOUS
  Filled 2023-10-30 (×4): qty 150

## 2023-10-30 MED ORDER — PENTAFLUOROPROP-TETRAFLUOROETH EX AERO: 1.0000 | INHALATION_SPRAY | CUTANEOUS | Status: DC | PRN

## 2023-10-30 MED ORDER — TOBRAMYCIN SULFATE 80 MG/2ML IJ SOLN
INTRAMUSCULAR | Status: DC | PRN
  Administered 2023-10-30: 120 mg

## 2023-10-30 MED ORDER — LIDOCAINE HCL (PF) 1 % IJ SOLN: 5.0000 mL | INTRAMUSCULAR | Status: DC | PRN

## 2023-10-30 MED ORDER — FENTANYL CITRATE (PF) 100 MCG/2ML IJ SOLN
25.0000 ug | INTRAMUSCULAR | Status: DC | PRN
  Administered 2023-10-30 (×2): 25 ug via INTRAVENOUS

## 2023-10-30 MED ORDER — ORAL CARE MOUTH RINSE: 15.0000 mL | Freq: Once | OROMUCOSAL | Status: AC

## 2023-10-30 MED ORDER — ONDANSETRON HCL 4 MG/2ML IJ SOLN
INTRAMUSCULAR | Status: AC
  Filled 2023-10-30: qty 2

## 2023-10-30 MED ORDER — SUGAMMADEX SODIUM 200 MG/2ML IV SOLN
INTRAVENOUS | Status: DC | PRN
  Administered 2023-10-30: 200 mg via INTRAVENOUS

## 2023-10-30 MED ORDER — BUPIVACAINE HCL (PF) 0.5 % IJ SOLN
INTRAMUSCULAR | Status: DC | PRN
  Administered 2023-10-30: 15 mL

## 2023-10-30 MED ORDER — SODIUM CHLORIDE 0.9 % IV SOLN: INTRAVENOUS | Status: DC

## 2023-10-30 MED ORDER — TOBRAMYCIN SULFATE 80 MG/2ML IJ SOLN
INTRAMUSCULAR | Status: AC
  Filled 2023-10-30: qty 4

## 2023-10-30 MED ORDER — INSULIN GLARGINE-YFGN 100 UNIT/ML ~~LOC~~ SOLN: 15.0000 [IU] | Freq: Every day | SUBCUTANEOUS | 11 refills | Status: DC

## 2023-10-30 MED ORDER — HEPARIN SODIUM (PORCINE) 1000 UNIT/ML DIALYSIS: 1000.0000 [IU] | INTRAMUSCULAR | Status: DC | PRN

## 2023-10-30 MED ORDER — LIDOCAINE 2% (20 MG/ML) 5 ML SYRINGE
INTRAMUSCULAR | Status: DC | PRN
  Administered 2023-10-30: 50 mg via INTRAVENOUS

## 2023-10-30 MED ORDER — LIDOCAINE-PRILOCAINE 2.5-2.5 % EX CREA: 1.0000 | TOPICAL_CREAM | CUTANEOUS | Status: DC | PRN

## 2023-10-30 MED ORDER — PHENYLEPHRINE 80 MCG/ML (10ML) SYRINGE FOR IV PUSH (FOR BLOOD PRESSURE SUPPORT)
PREFILLED_SYRINGE | INTRAVENOUS | Status: DC | PRN
  Administered 2023-10-30 (×5): 80 ug via INTRAVENOUS

## 2023-10-30 MED ORDER — HEPARIN SODIUM (PORCINE) 1000 UNIT/ML IJ SOLN
4600.0000 [IU] | Freq: Once | INTRAMUSCULAR | Status: AC
  Administered 2023-10-30: 4600 [IU]
  Filled 2023-10-30: qty 5

## 2023-10-30 MED ORDER — ACETAMINOPHEN 500 MG PO TABS: 1000.0000 mg | ORAL_TABLET | Freq: Four times a day (QID) | ORAL | Status: DC | PRN

## 2023-10-30 MED ORDER — ACETAMINOPHEN 10 MG/ML IV SOLN: 1000.0000 mg | Freq: Once | INTRAVENOUS | Status: DC | PRN

## 2023-10-30 MED ORDER — DEXMEDETOMIDINE HCL IN NACL 200 MCG/50ML IV SOLN
INTRAVENOUS | Status: DC | PRN
  Administered 2023-10-30: 8 ug via INTRAVENOUS

## 2023-10-30 MED ORDER — SUCCINYLCHOLINE CHLORIDE 200 MG/10ML IV SOSY
PREFILLED_SYRINGE | INTRAVENOUS | Status: AC
  Filled 2023-10-30: qty 10

## 2023-10-30 MED ORDER — SUGAMMADEX SODIUM 200 MG/2ML IV SOLN
INTRAVENOUS | Status: AC
  Filled 2023-10-30: qty 2

## 2023-10-30 MED ORDER — FENTANYL CITRATE (PF) 250 MCG/5ML IJ SOLN
INTRAMUSCULAR | Status: DC | PRN
  Administered 2023-10-30: 50 ug via INTRAVENOUS

## 2023-10-30 MED ORDER — VANCOMYCIN HCL 1000 MG IV SOLR
INTRAVENOUS | Status: AC
Start: 2023-10-30 — End: ?
  Filled 2023-10-30: qty 20

## 2023-10-30 MED ORDER — VANCOMYCIN HCL 500 MG IV SOLR
INTRAVENOUS | Status: DC | PRN
  Administered 2023-10-30: 500 mg via TOPICAL

## 2023-10-30 SURGICAL SUPPLY — 69 items
BAG COUNTER SPONGE SURGICOUNT (BAG) IMPLANT
BLADE SURG 15 STRL LF DISP TIS (BLADE) ×4 IMPLANT
BNDG ELASTIC 3INX 5YD STR LF (GAUZE/BANDAGES/DRESSINGS) ×2 IMPLANT
BNDG ELASTIC 4INX 5YD STR LF (GAUZE/BANDAGES/DRESSINGS) ×2 IMPLANT
BNDG ESMARK 4X9 LF (GAUZE/BANDAGES/DRESSINGS) ×2 IMPLANT
BNDG GAUZE DERMACEA FLUFF 4 (GAUZE/BANDAGES/DRESSINGS) ×2 IMPLANT
BUR CROSS CUT FISSURE 1.6 (BURR) ×2 IMPLANT
BUR EGG ELITE 4.0 (BURR) ×2 IMPLANT
BUR EGG ELITE 5.0 (BURR) IMPLANT
CANISTER SUCT 3000ML PPV (MISCELLANEOUS) ×2 IMPLANT
CHLORAPREP W/TINT 26 (MISCELLANEOUS) ×2 IMPLANT
CNTNR URN SCR LID CUP LEK RST (MISCELLANEOUS) ×2 IMPLANT
COVER BACK TABLE 60X90IN (DRAPES) ×2 IMPLANT
COVER SURGICAL LIGHT HANDLE (MISCELLANEOUS) ×2 IMPLANT
CUFF TOURN SGL QUICK 18X4 (TOURNIQUET CUFF) ×2 IMPLANT
CUFF TRNQT CYL 24X4X16.5-23 (TOURNIQUET CUFF) ×2 IMPLANT
DRAPE 3/4 80X56 (DRAPES) ×2 IMPLANT
DRAPE EXTREMITY T 121X128X90 (DISPOSABLE) ×2 IMPLANT
DRAPE SHEET LG 3/4 BI-LAMINATE (DRAPES) ×2 IMPLANT
DRAPE U-SHAPE 47X51 STRL (DRAPES) ×2 IMPLANT
ELECT REM PT RETURN 15FT ADLT (MISCELLANEOUS) ×2 IMPLANT
GAUZE PACKING FOLDED 2 STR (GAUZE/BANDAGES/DRESSINGS) ×2 IMPLANT
GAUZE PAD ABD 8X10 STRL (GAUZE/BANDAGES/DRESSINGS) IMPLANT
GAUZE SPONGE 4X4 12PLY STRL (GAUZE/BANDAGES/DRESSINGS) ×2 IMPLANT
GAUZE XEROFORM 1X8 LF (GAUZE/BANDAGES/DRESSINGS) ×2 IMPLANT
GLOVE BIO SURGEON STRL SZ7.5 (GLOVE) ×2 IMPLANT
GLOVE BIO SURGEON STRL SZ8 (GLOVE) ×2 IMPLANT
GLOVE BIOGEL PI IND STRL 7.5 (GLOVE) ×2 IMPLANT
GOWN STRL REUS W/ TWL LRG LVL3 (GOWN DISPOSABLE) ×2 IMPLANT
GOWN STRL REUS W/ TWL XL LVL3 (GOWN DISPOSABLE) ×4 IMPLANT
GRAFT SKIN WND MICRO 38 (Tissue) IMPLANT
IV NS 1000ML BAXH (IV SOLUTION) ×2 IMPLANT
KIT BASIN OR (CUSTOM PROCEDURE TRAY) ×4 IMPLANT
KIT STIMULAN RAPID CURE 10CC (Orthopedic Implant) IMPLANT
KIT TURNOVER KIT A (KITS) IMPLANT
KIT TURNOVER KIT B (KITS) ×2 IMPLANT
MANIFOLD NEPTUNE II (INSTRUMENTS) ×2 IMPLANT
NDL HYPO 25GX1X1/2 BEV (NEEDLE) ×4 IMPLANT
NDL HYPO 25X1 1.5 SAFETY (NEEDLE) ×2 IMPLANT
NEEDLE HYPO 25GX1X1/2 BEV (NEEDLE) ×4 IMPLANT
NEEDLE HYPO 25X1 1.5 SAFETY (NEEDLE) ×2 IMPLANT
NS IRRIG 1000ML POUR BTL (IV SOLUTION) ×4 IMPLANT
PAD ARMBOARD 7.5X6 YLW CONV (MISCELLANEOUS) ×2 IMPLANT
PADDING CAST ABS COTTON 4X4 ST (CAST SUPPLIES) ×2 IMPLANT
PENCIL SMOKE EVACUATOR (MISCELLANEOUS) IMPLANT
SET HNDPC FAN SPRY TIP SCT (DISPOSABLE) IMPLANT
SET IRRIG Y TYPE TUR BLADDER L (SET/KITS/TRAYS/PACK) ×2 IMPLANT
SLEEVE IRRIGATION ELITE 7 (MISCELLANEOUS) ×2 IMPLANT
SPIKE FLUID TRANSFER (MISCELLANEOUS) ×2 IMPLANT
SPONGE SURGIFOAM ABS GEL 12-7 (HEMOSTASIS) IMPLANT
SPONGE T-LAP 4X18 ~~LOC~~+RFID (SPONGE) ×2 IMPLANT
STAPLER SKIN PROX WIDE 3.9 (STAPLE) ×2 IMPLANT
STOCKINETTE 6 STRL (DRAPES) ×2 IMPLANT
SUCTION TUBE FRAZIER 10FR DISP (SUCTIONS) ×2 IMPLANT
SUT ETHILON 3 0 PS 1 (SUTURE) ×2 IMPLANT
SUT ETHILON 4 0 PS 2 18 (SUTURE) ×2 IMPLANT
SUT MNCRL AB 3-0 PS2 18 (SUTURE) ×2 IMPLANT
SUT MNCRL AB 4-0 PS2 18 (SUTURE) ×2 IMPLANT
SUT PLAIN 3 0 PS2 27 (SUTURE) ×2 IMPLANT
SUT VIC AB 2-0 SH 27XBRD (SUTURE) ×2 IMPLANT
SYR BULB EAR ULCER 3OZ GRN STR (SYRINGE) ×2 IMPLANT
SYR BULB IRRIG 60ML STRL (SYRINGE) ×2 IMPLANT
SYR CONTROL 10ML LL (SYRINGE) ×4 IMPLANT
TRAY ENT MC OR (CUSTOM PROCEDURE TRAY) ×2 IMPLANT
TUBE IRRIGATION SET MISONIX (TUBING) ×2 IMPLANT
TUBING IRRIGATION (MISCELLANEOUS) ×2 IMPLANT
UNDERPAD 30X36 HEAVY ABSORB (UNDERPADS AND DIAPERS) ×2 IMPLANT
YANKAUER SUCT BULB TIP 10FT TU (MISCELLANEOUS) ×2 IMPLANT
YANKAUER SUCT BULB TIP NO VENT (SUCTIONS) ×4 IMPLANT

## 2023-10-30 NOTE — Progress Notes (Signed)
 D/C order noted. Pt had procedure this morning. Contacted [facility] to be advised that pt will d/c today and should resume care on Monday. Renal NP contacted clinic to discuss pt's iv abx needs at d/c.   Olivia Canter Renal Navigator [phone]

## 2023-10-30 NOTE — Progress Notes (Signed)
 PHARMACY CONSULT NOTE FOR:  OUTPATIENT  PARENTERAL ANTIBIOTIC THERAPY (OPAT)  Informational as the patient will receive antibiotics with hemodialysis outpatient  Indication: L-foot osteo Regimen: Vancomycin 1g/HD-MWF + Ceftazidime 1g/HD-MWF End date: 12/10/23   IV antibiotic discharge orders are pended. To discharging provider:  please sign these orders via discharge navigator,  Select New Orders & click on the button choice - Manage This Unsigned Work.     Thank you for allowing pharmacy to be a part of this patient's care.  Georgina Pillion, PharmD, BCPS, BCIDP Infectious Diseases Clinical Pharmacist 10/30/2023 11:53 AM   **Pharmacist phone directory can now be found on [email or web address] ([email or web address]).  Listed under [facility].

## 2023-10-30 NOTE — Progress Notes (Signed)
 History and Physical Interval Note:  10/30/2023 7:31 AM  Jeremy [name]  has presented today for surgery, with the diagnosis of open wound s/p prior 3rd metatarsal resection left foot.  The various methods of treatment have been discussed with the patient and family. After consideration of risks, benefits and other options for treatment, the patient has consented to   Procedure(s): DENTAL RESTORATION/EXTRACTIONS (N/A) IRRIGATION AND DEBRIDEMENT WOUND (Left) possible abx beads, graft, vac vs delayed closure as a surgical intervention.  The patient's history has been reviewed, patient examined, no change in status, stable for surgery.  I have reviewed the patient's chart and labs.  Questions were answered to the patient's satisfaction.     Jenelle Mages Gildo Crisco

## 2023-10-30 NOTE — Progress Notes (Signed)
 Orthopedic Tech Progress Note Patient Details:  Jeremy [name] 1981-01-14 [id number]  Ortho Devices Type of Ortho Device: Postop shoe/boot Ortho Device/Splint Location: left Ortho Device/Splint Interventions: Ordered, Application, Adjustment   Post Interventions Patient Tolerated: Well Instructions Provided: Adjustment of device  Tonye Pearson 10/30/2023, 11:37 AM

## 2023-10-30 NOTE — Plan of Care (Signed)
 Problem: Education: Goal: Ability to describe self-care measures that may prevent or decrease complications (Diabetes Survival Skills Education) will improve Outcome: Adequate for Discharge Goal: Individualized Educational Video(s) Outcome: Adequate for Discharge   Problem: Coping: Goal: Ability to adjust to condition or change in health will improve 10/30/2023 1849 by Garth Bigness, RN Outcome: Adequate for Discharge 10/30/2023 8657 by Garth Bigness, RN Outcome: Progressing   Problem: Fluid Volume: Goal: Ability to maintain a balanced intake and output will improve Outcome: Adequate for Discharge   Problem: Health Behavior/Discharge Planning: Goal: Ability to identify and utilize available resources and services will improve Outcome: Adequate for Discharge Goal: Ability to manage health-related needs will improve Outcome: Adequate for Discharge   Problem: Metabolic: Goal: Ability to maintain appropriate glucose levels will improve Outcome: Adequate for Discharge   Problem: Nutritional: Goal: Maintenance of adequate nutrition will improve 10/30/2023 1849 by Garth Bigness, RN Outcome: Adequate for Discharge 10/30/2023 8469 by Garth Bigness, RN Outcome: Progressing Goal: Progress toward achieving an optimal weight will improve 10/30/2023 1849 by Garth Bigness, RN Outcome: Adequate for Discharge 10/30/2023 6295 by Garth Bigness, RN Outcome: Progressing   Problem: Skin Integrity: Goal: Risk for impaired skin integrity will decrease 10/30/2023 1849 by Garth Bigness, RN Outcome: Adequate for Discharge 10/30/2023 2841 by Garth Bigness, RN Outcome: Progressing   Problem: Tissue Perfusion: Goal: Adequacy of tissue perfusion will improve 10/30/2023 1849 by Garth Bigness, RN Outcome: Adequate for Discharge 10/30/2023 3244 by Garth Bigness, RN Outcome: Progressing   Problem: Education: Goal: Knowledge of General Education information will  improve Description: Including pain rating scale, medication(s)/side effects and non-pharmacologic comfort measures Outcome: Adequate for Discharge   Problem: Health Behavior/Discharge Planning: Goal: Ability to manage health-related needs will improve Outcome: Adequate for Discharge   Problem: Clinical Measurements: Goal: Ability to maintain clinical measurements within normal limits will improve Outcome: Adequate for Discharge Goal: Will remain free from infection Outcome: Adequate for Discharge Goal: Diagnostic test results will improve Outcome: Adequate for Discharge Goal: Respiratory complications will improve Outcome: Adequate for Discharge Goal: Cardiovascular complication will be avoided Outcome: Adequate for Discharge   Problem: Activity: Goal: Risk for activity intolerance will decrease 10/30/2023 1849 by Garth Bigness, RN Outcome: Adequate for Discharge 10/30/2023 0102 by Garth Bigness, RN Outcome: Progressing   Problem: Nutrition: Goal: Adequate nutrition will be maintained 10/30/2023 1849 by Garth Bigness, RN Outcome: Adequate for Discharge 10/30/2023 7253 by Garth Bigness, RN Outcome: Progressing   Problem: Coping: Goal: Level of anxiety will decrease 10/30/2023 1849 by Garth Bigness, RN Outcome: Adequate for Discharge 10/30/2023 6644 by Garth Bigness, RN Outcome: Progressing   Problem: Elimination: Goal: Will not experience complications related to bowel motility 10/30/2023 1849 by Garth Bigness, RN Outcome: Adequate for Discharge 10/30/2023 0347 by Garth Bigness, RN Outcome: Progressing Goal: Will not experience complications related to urinary retention 10/30/2023 1849 by Garth Bigness, RN Outcome: Adequate for Discharge 10/30/2023 4259 by Garth Bigness, RN Outcome: Progressing   Problem: Pain Managment: Goal: General experience of comfort will improve and/or be controlled 10/30/2023 1849 by Garth Bigness,  RN Outcome: Adequate for Discharge 10/30/2023 5638 by Garth Bigness, RN Outcome: Progressing   Problem: Safety: Goal: Ability to remain free from injury will improve 10/30/2023 1849 by Garth Bigness, RN Outcome: Adequate for Discharge 10/30/2023 7564 by Garth Bigness, RN Outcome: Progressing   Problem: Skin Integrity: Goal: Risk for impaired  skin integrity will decrease 10/30/2023 1849 by Garth Bigness, RN Outcome: Adequate for Discharge 10/30/2023 8295 by Garth Bigness, RN Outcome: Progressing

## 2023-10-30 NOTE — Progress Notes (Signed)
 Discharge information given to the patient. PIV removed. Patient rolled out of the unit on a wheelchair along with family members. Patient belongings with the family members.

## 2023-10-30 NOTE — Discharge Summary (Signed)
 Name: Jeremy [name] MRN: [id number] DOB: [birth year] 43 y.o. PCP: Manuela Neptune, MD  Date of Admission: 10/26/2023  3:16 AM Date of Discharge:  10/30/23 Attending Physician: Dr.  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)   Acute bacterial endocarditis    DISCHARGE MEDICATIONS:   Allergies as of 10/30/2023       Reactions   Acyclovir And Related Hives   Imdur [isosorbide Nitrate]    Headaches from nitrate        Medication List     STOP taking these medications    ciprofloxacin 500 MG tablet Commonly known as: Cipro   insulin glargine-yfgn 100 UNIT/ML Pen Commonly known as: Semglee (yfgn) Replaced by: insulin glargine-yfgn 100 UNIT/ML injection   NovoLOG FlexPen 100 UNIT/ML FlexPen Generic drug: insulin aspart       TAKE these medications    Accu-Chek Guide Test test strip Generic drug: glucose blood Use as directed four times daily.   Accu-Chek Guide w/Device Kit Use to check blood sugar up to four times daily as directed.   Accu-Chek Softclix Lancets lancets Use as directed four times daily.   acetaminophen 500 MG tablet Commonly known as: TYLENOL Take 1,000 mg by mouth every 6 (six) hours as needed for moderate pain.   atorvastatin 80 MG tablet Commonly known as: LIPITOR Take 1 tablet (80 mg total) by mouth daily.   carvedilol 25 MG tablet Commonly known as: COREG Take 2 tablets (50 mg total) by mouth 2 (two) times daily.   cefTAZidime IVPB Commonly known as: FORTAZ Inject 1 g into the vein every  Monday, Wednesday, and Friday with hemodialysis. Indication:  Foot osteo Last Day of Therapy:  12/10/23 Labs - Once weekly:  CBC/D and BMP, ESR and CRP Method of administration: Per HD protocol   hydrALAZINE 50 MG tablet Commonly known as: APRESOLINE Take 1 tablet (50 mg total) by mouth 3 (three) times daily.   HYDROmorphone 2 MG tablet Commonly known as: Dilaudid Take 0.5 tablets (1 mg total) by mouth every 8 (eight) hours as needed for severe pain (pain score 7-10).   insulin glargine-yfgn 100 UNIT/ML injection Commonly known as: SEMGLEE Inject 0.15 mLs (15 Units total) into the skin at bedtime. Replaces: insulin glargine-yfgn 100 UNIT/ML Pen   multivitamin Tabs tablet Take 1 tablet by mouth daily.   nitroGLYCERIN 0.4 MG SL tablet Commonly known as: NITROSTAT Place 1 tablet (0.4 mg total) under the tongue every 5 (five) minutes x 3 doses as needed within 15 minutes. If chest pain does not resolve, seek medical attention.   ondansetron 4 MG tablet Commonly known as: ZOFRAN Take 1 tablet by mouth once a day as needed What changed: Another medication with the same name was changed. Make sure you understand how and when to take each.   ondansetron 4 MG tablet Commonly known as: ZOFRAN Take 1 tablet by mouth once a day as needed What changed: when to take this   pantoprazole 20 MG tablet Commonly known as: Protonix Take 1 tablet (20 mg total) by  mouth daily as directed for reflux.   sevelamer carbonate 800 MG tablet Commonly known as: RENVELA Take 3 tablets (2,400 mg total) by mouth 3 (three) times daily with meals   vancomycin IVPB Inject 1,000 mg into the vein every Monday, Wednesday, and Friday with hemodialysis. Indication:  Foot osteo Last Day of Therapy:  12/10/23 Labs - Once weekly:  CBC/D, BMP, and vancomycin trough, ESR, CRP Method of administration:Per HD protocol               Home Infusion Instuctions  (From admission, onward)           Start      Ordered   10/30/23 0000  Home infusion instructions       Comments: To be given with HD  Question:  Instructions  Answer:  Flushing of vascular access device: 0.9% NaCl pre/post medication administration and prn patency; Heparin 100 u/ml, 5ml for implanted ports and Heparin 10u/ml, 5ml for all other central venous catheters.   10/30/23 1154              Discharge Care Instructions  (From admission, onward)           Start     Ordered   10/30/23 0000  Discharge wound care:       Comments: Negative pressure wound therapy (125 mm/Hg) every Mon-Wed-Fri 1000   10/30/23 1546            DISPOSITION AND FOLLOW-UP:  [name] was discharged from [facility] in Stable condition. At the hospital follow up visit please address:  Follow-up Recommendations: Consults: Infectious disease, cardiology  Labs:  CBC , renal function panel, path labs Studies: None Medications: Insulin glargine, vancomycin & ceftazidime (per HD), insulin aspart (discontinued at discharge)   HOSPITAL COURSE:  Patient Summary: #Acute hypoxemic respiratory failure #Flash pulmonary edema #HFpEF exacerbation Presented with chest pain and reportedly 20 pounds above his dry weight.  Chest x-ray notable for worsening pulmonary edema consistent with CHF exacerbation.  TTE notable for worsening ejection fraction to 35 to 40% with severe aortic valve regurgitation.  Valve replacement limited by teeth extraction noted on previous visits.  Dr. Barbette Merino performed inpatient procedure with Dr. Annamary Rummage alongside for second procedure on toes.  Per cardiology, they will be eligible for valve replacement after completion of antibiotics and source clearance.  #Osteomyelitis of third metatarsal of left foot #Wound dehiscence of previous surgical site of fourth phalanx of left toe Presented with leukocytosis of 15.1.  Started on IV vancomycin and cefepime.  No growth on blood cultures.  MRI shows  findings suspicious for recurrent osteomyelitis.  Podiatry and infectious disease consulted.  Initial concerns for operation by podiatry given aortic regurgitation, but cleared by cardiology.  I&D performed twice by Dr. Annamary Rummage. Patient switched to Unasyn and vancomycin postoperatively.  Unasyn was switched with cefepime.  Per ID, patient will not require a PICC line after discharge but will receive his antibiotics at HD. Will discharge on short course of PRN pain medications. Patient was advised to be non-weight bearing on his operative leg. Patient notes that he has crutches at home with a walker and shower chair and that he feels safe to ambulate at home.   #ESRD on dialysis MWF Followed and received HD per nephro.  Resumed home Renvela.  #Type 2 diabetes mellitus Started on SSI and home Lantus 20 units daily resumed.  Lantus decreased to 15 units daily given numerous n.p.o. orders and episode of hypoglycemia. Will  discharge on 15 units daily of Lantus and will discontinue his meal time insulin.   #GERD Home Protonix 20 mg daily increased to 40 mg daily.   DISCHARGE INSTRUCTIONS:   Discharge Instructions     (HEART FAILURE PATIENTS) Call MD:  Anytime you have any of the following symptoms: 1) 3 pound weight gain in 24 hours or 5 pounds in 1 week 2) shortness of breath, with or without a dry hacking cough 3) swelling in the hands, feet or stomach 4) if you have to sleep on extra pillows at night in order to breathe.   Complete by: As directed    Call MD for:  difficulty breathing, headache or visual disturbances   Complete by: As directed    Call MD for:  extreme fatigue   Complete by: As directed    Call MD for:  hives   Complete by: As directed    Call MD for:  persistant dizziness or light-headedness   Complete by: As directed    Call MD for:  persistant nausea and vomiting   Complete by: As directed    Call MD for:  redness, tenderness, or signs of infection (pain, swelling,  redness, odor or green/yellow discharge around incision site)   Complete by: As directed    Call MD for:  severe uncontrolled pain   Complete by: As directed    Call MD for:  temperature >100.4   Complete by: As directed    Diet - low sodium heart healthy   Complete by: As directed    Discharge instructions   Complete by: As directed    You were hospitalized for Acute Hypoxic Respiratory Failure, Flash Pulmonary Edema, and Heart Failure exacerbation from Severe Aortic Regurgitation. Thank you for allowing [place] to be part of your care.   We arranged for you to follow up at: 1. Internal Medicine Clinic: 2/28 @ 9:45 am with Dr Lyn Hollingshead 2. Cardiothoracic Surgery on 3/4 @ 1:30 PM, [facility]  3. Infectious Disease on 4/3,  [facility]  Please *STOP* taking the following medications:  - Insulin aspart 15 units three times daily   For your insulin glargine, please start taking 15 units instead of 20 units daily.   You will be receiving your antibiotics outpatients at hemodialysis.   I have also given you a short dose of Dilaudid to help with your pain.   You will be nonweightbearing in a postop shoe to the operative limb until further instructed. The dressing is to remain clean, dry, and intact.  Please make sure to follow up with your outpatient appointments.   Please call our clinic if you have any questions or concerns, we may be able to help and keep you from a long and expensive emergency room wait. Our clinic and after hours phone number is [phone], the best time to call is Monday through Friday 9 am to 4 pm but there is always someone available 24/7 if you have an emergency. If you need medication refills please notify your pharmacy one week in advance and they will send [place] a request.   Discharge wound care:   Complete by: As directed    Negative pressure wound therapy (125 mm/Hg) every Mon-Wed-Fri 1000   Home infusion  instructions   Complete by: As directed    To be given with HD   Instructions: Flushing of vascular access device: 0.9% NaCl pre/post medication administration and prn patency; Heparin 100  u/ml, 5ml for implanted ports and Heparin 10u/ml, 5ml for all other central venous catheters.   Increase activity slowly   Complete by: As directed        SUBJECTIVE:  Patient was evaluated at bedside. Tolerated procedures well. Denies any chest pain, troubles breathing, or other concerns.  Discharge Vitals:   BP 110/63   Pulse 67   Temp 98.1 F (36.7 C)   Resp 19   Ht 6\' 5"  (1.956 m)   Wt 113.4 kg Comment: Taken via bed.  SpO2 100%   BMI 29.65 kg/m   OBJECTIVE:  Physical Exam Constitutional:      Appearance: He is obese.  HENT:     Head: Normocephalic and atraumatic.  Pulmonary:     Effort: Pulmonary effort is normal.     Breath sounds: Normal breath sounds.  Abdominal:     General: Bowel sounds are normal.     Palpations: Abdomen is soft.  Neurological:     General: No focal deficit present.     Mental Status: He is alert.  Psychiatric:        Mood and Affect: Mood normal.        Behavior: Behavior normal.     Pertinent Labs, Studies, and Procedures:     Latest Ref Rng & Units 10/30/2023    8:27 AM 10/30/2023    7:37 AM 10/29/2023    2:18 AM  CBC  WBC 4.0 - 10.5 K/uL  8.8  8.9   Hemoglobin 13.0 - 17.0 g/dL 60.4  54.0  98.1   Hematocrit 39.0 - 52.0 % 36.0  33.5  34.9   Platelets 150 - 400 K/uL  296  283        Latest Ref Rng & Units 10/30/2023    8:27 AM 10/30/2023    7:37 AM 10/29/2023    2:18 AM  CMP  Glucose 70 - 99 mg/dL 191  478  295   BUN 6 - 20 mg/dL 72  80  58   Creatinine 0.61 - 1.24 mg/dL 62.13  08.65  78.46   Sodium 135 - 145 mmol/L 136  137  137   Potassium 3.5 - 5.1 mmol/L 5.0  4.8  4.4   Chloride 98 - 111 mmol/L 101  98  97   CO2 22 - 32 mmol/L  24  24   Calcium 8.9 - 10.3 mg/dL  9.2  9.2     MR FOOT LEFT WO CONTRAST Result Date: 10/27/2023 CLINICAL  DATA:  Clinical concern for left foot osteomyelitis. 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 EXAM: MRI OF THE LEFT FOOT WITHOUT CONTRAST TECHNIQUE: Multiplanar, multisequence MR imaging of the left forefoot was performed. No intravenous contrast was administered. COMPARISON:  Radiographs 10/20/2023 and 09/03/2023.  MRI 09/04/2023. FINDINGS: Bones/Joint/Cartilage There are postsurgical changes from previous amputation of the 3rd ray through the distal 3rd metatarsal and amputation through the base of the 4th proximal phalanx. Apparent sinus tract extending dorsally from the remaining 3rd metatarsal, best seen on the short axis axial images. There is a small fluid collection adjacent to the remaining 3rd metatarsal with heterotopic ossification and prominent marrow edema extending proximally throughout the 3rd metatarsal shaft. Nonspecific low-level T2 hyperintensity within the heads of the 2nd and 4th metatarsals without cortical destruction or abnormal T1 signal. Nonspecific mild T2 hyperintensity within the remaining 4th proximal phalanx. No significant abnormalities within the 1st or 5th rays. No significant midfoot arthropathy or effusion. Ligaments Intact Lisfranc ligament. The collateral ligaments of  the remaining metatarsophalangeal joints are intact. Muscles and Tendons Mild muscular atrophy. Postsurgical changes related to previous amputations. No significant tenosynovitis. Soft tissues Postsurgical changes related to previous amputations with apparent sinus tract extending dorsally from the remaining 3rd metatarsal. Subcentimeter fluid collection adjacent to the remaining 3rd metatarsal with surrounding inflammatory changes. No drainable fluid collections are identified. IMPRESSION: 1. 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 Apparent sinus tract extending dorsally from the remaining 3rd metatarsal with a small fluid  collection adjacent to the remaining 3rd metatarsal and prominent marrow edema extending proximally throughout the 3rd metatarsal shaft. 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 2. Nonspecific low-level T2 hyperintensity within the heads of the 2nd and 4th metatarsals without cortical destruction or abnormal T1 signal. 3. No drainable fluid collections identified. Electronically Signed   By: Carey Bullocks M.D.   On: 10/27/2023 10:30   VAS [place] ABI WITH/WO TBI Result Date: 10/26/2023  LOWER EXTREMITY DOPPLER STUDY Patient Name:  TAYSHON WINKER  Date of Exam:   10/26/2023 Medical Rec #: [id number]        Accession #:    [id number] Date of Birth: [birth year]        Patient Gender: M Patient Age:   70 years Exam [facility]:  [facility] Procedure:      VAS [place] ABI WITH/WO TBI Referring Phys: Lyn Hollingshead STANDIFORD --------------------------------------------------------------------------------  Indications: Ulceration. High Risk Factors: Hypertension, hyperlipidemia, Diabetes.  Limitations: Today's exam was limited due to Great toe girth, involuntary              patient movement, an open wound and bandages. Comparison Study: No prior studies. Performing Technologist: Olen Cordial RVT  Examination Guidelines: A complete evaluation includes at minimum, Doppler waveform signals and systolic blood pressure reading at the level of bilateral brachial, anterior tibial, and posterior tibial arteries, when vessel segments are accessible. Bilateral testing is considered an integral part of a complete examination. Photoelectric Plethysmograph (PPG) waveforms and toe systolic pressure readings are included as required and additional duplex testing as needed. Limited examinations for reoccurring indications may be performed as noted.  ABI Findings: +--------+------------------+-----+---------+--------+ Right   Rt Pressure (mmHg)IndexWaveform Comment  +--------+------------------+-----+---------+--------+  [id number]                    triphasic         +--------+------------------+-----+---------+--------+ PTA     219               1.34 triphasic         +--------+------------------+-----+---------+--------+ DP      229               1.40 triphasic         +--------+------------------+-----+---------+--------+ +--------+------------------+-----+-----------+-------+ Left    Lt Pressure (mmHg)IndexWaveform   Comment +--------+------------------+-----+-----------+-------+ [id number]                    triphasic          +--------+------------------+-----+-----------+-------+ PTA     242               1.48 multiphasic        +--------+------------------+-----+-----------+-------+ DP      244               1.50 multiphasic        +--------+------------------+-----+-----------+-------+ +-------+-----------+-----------+------------+------------+ ABI/TBIToday's ABIToday's TBIPrevious ABIPrevious TBI +-------+-----------+-----------+------------+------------+ Right  1.4                                            +-------+-----------+-----------+------------+------------+  Left   1.5                                            +-------+-----------+-----------+------------+------------+  Summary: Right: Resting right ankle-brachial index indicates noncompressible right lower extremity arteries. Unable to obtain TBI due to great toe girth. Left: Resting left ankle-brachial index indicates noncompressible left lower extremity arteries. Unable to obtain TBI due to great toe girth. *See table(s) above for measurements and observations.  Electronically signed by Sherald Hess MD on 10/26/2023 at 4:47:59 PM.    Final    ECHOCARDIOGRAM COMPLETE Result Date: 10/26/2023    ECHOCARDIOGRAM REPORT   Patient Name:   KOSTON HENNES Date of Exam: 10/26/2023 Medical Rec #:  [id number]       Height:       77.0 in Accession #:    [id number]      Weight:       240.0 lb Date of  Birth:  31-May-1981       BSA:          2.417 m Patient Age:    42 years        BP:           1331/61 mmHg Patient Gender: M               HR:           110 bpm. Exam Location:  Inpatient Procedure: 2D Echo, Cardiac Doppler and Color Doppler (Both Spectral and Color            Flow Doppler were utilized during procedure). Indications:    Dyspnea  History:        Patient has prior history of Echocardiogram examinations, most                 recent 07/18/2022. CHF and Cardiomyopathy; Risk                 Factors:Diabetes.  Sonographer:    Darlys Gales Referring Phys: [id number] JULIE MACHEN IMPRESSIONS  1. Left ventricular ejection fraction, by estimation, is 35 to 40%. The left ventricle has moderately decreased function. The left ventricle demonstrates global hypokinesis. The left ventricular internal cavity size was severely dilated. Left ventricular diastolic function could not be evaluated.  2. Right ventricular systolic function is normal. The right ventricular size is normal. Tricuspid regurgitation signal is inadequate for assessing PA pressure.  3. The mitral valve is normal in structure. Mild mitral valve regurgitation. No evidence of mitral stenosis.  4. Tricuspid valve regurgitation is mild to moderate.  5. The aortic valve is abnormal.     The right and non coronary cusps have focal calcifications that are concerning for possible vegetations. Aortic valve regurgitation is severe. Aortic valve sclerosis/calcification is present, without any evidence of aortic stenosis. Aortic valve mean  gradient measures 5.0 mmHg. Aortic valve Vmax measures 1.46 m/s.  6. The inferior vena cava is normal in size with greater than 50% respiratory variability, suggesting right atrial pressure of 3 mmHg.  7. In the PSAX view at the level of the AV, there is a jet by colorflow perpendicular to the aortic valve into the LVOT. Unclear if this is eccentric AI an eccentric TR in this area or actually a fistula.  8. Recommend TEE for  further evaluation of AV leaflets and AR. FINDINGS  Left Ventricle: Left ventricular ejection fraction,  by estimation, is 35 to 40%. The left ventricle has moderately decreased function. The left ventricle demonstrates global hypokinesis. Strain imaging was not performed. The left ventricular internal cavity size was severely dilated. There is no left ventricular hypertrophy. Left ventricular diastolic function could not be evaluated. Right Ventricle: The right ventricular size is normal. No increase in right ventricular wall thickness. Right ventricular systolic function is normal. Tricuspid regurgitation signal is inadequate for assessing PA pressure. Left Atrium: Left atrial size was normal in size. Right Atrium: Right atrial size was normal in size. Pericardium: There is no evidence of pericardial effusion. Mitral Valve: The mitral valve is normal in structure. Mild mitral valve regurgitation. No evidence of mitral valve stenosis. Tricuspid Valve: The tricuspid valve is normal in structure. Tricuspid valve regurgitation is mild to moderate. No evidence of tricuspid stenosis. Aortic Valve: The right and non coronary cusps have focal calcifications. Cannot rule out vegetations. The aortic valve is abnormal. Aortic valve regurgitation is severe. Aortic regurgitation PHT measures 176 msec. Aortic valve sclerosis/calcification is  present, without any evidence of aortic stenosis. Aortic valve mean gradient measures 5.0 mmHg. Aortic valve peak gradient measures 8.5 mmHg. Pulmonic Valve: The pulmonic valve was normal in structure. Pulmonic valve regurgitation is not visualized. No evidence of pulmonic stenosis. Aorta: The aortic root is normal in size and structure. Venous: The inferior vena cava is normal in size with greater than 50% respiratory variability, suggesting right atrial pressure of 3 mmHg. IAS/Shunts: No atrial level shunt detected by color flow Doppler. Additional Comments: 3D imaging was not performed.   LEFT VENTRICLE PLAX 2D LVIDd:         7.00 cm Diastology LVIDs:         6.00 cm LV e' lateral:   7.72 cm/s LV PW:         1.20 cm LV E/e' lateral: 14.8 LV IVS:        1.10 cm  RIGHT VENTRICLE RV S prime:     10.00 cm/s TAPSE (M-mode): 1.8 cm LEFT ATRIUM              Index        RIGHT ATRIUM          Index LA Vol (A2C):   122.0 ml 50.48 ml/m  RA Area:     8.91 cm LA Vol (A4C):   63.3 ml  26.19 ml/m  RA Volume:   13.70 ml 5.67 ml/m LA Biplane Vol: 91.0 ml  37.65 ml/m  AORTIC VALVE AV Vmax:           146.00 cm/s AV Vmean:          107.000 cm/s AV VTI:            0.236 m AV Peak Grad:      8.5 mmHg AV Mean Grad:      5.0 mmHg LVOT Vmax:         133.00 cm/s LVOT Vmean:        99.800 cm/s LVOT VTI:          0.228 m LVOT/AV VTI ratio: 0.97 AI PHT:            176 msec MITRAL VALVE MV Area (PHT): 5.42 cm     SHUNTS MV Decel Time: 140 msec     Systemic VTI: 0.23 m MV E velocity: 114.00 cm/s Armanda Magic MD Electronically signed by Armanda Magic MD Signature Date/Time: 10/26/2023/2:12:58 PM    Final    DG Chest  Portable 1 View Result Date: 10/26/2023 CLINICAL DATA:  Shortness of breath EXAM: PORTABLE CHEST 1 VIEW COMPARISON:  10/12/2023 FINDINGS: Cardiac shadow is enlarged but stable. Dialysis catheter is again seen. Increasing vascular congestion is noted consistent with fluid overload. Some parenchymal opacity is noted in the bases which may represent edema. IMPRESSION: Changes of CHF likely related to volume overload. Electronically Signed   By: Alcide Clever M.D.   On: 10/26/2023 03:51    Signed: Morrie Sheldon, MD Internal Medicine Resident, PGY-1 [facility]  Pager: [phone]

## 2023-10-30 NOTE — Anesthesia Procedure Notes (Signed)
 Procedure Name: Intubation Date/Time: 10/30/2023 9:32 AM  Performed by: Bartholomew Crews, CRNAPre-anesthesia Checklist: Patient identified Patient Re-evaluated:Patient Re-evaluated prior to induction Oxygen Delivery Method: Circle system utilized Preoxygenation: Pre-oxygenation with 100% oxygen Induction Type: IV induction Ventilation: Mask ventilation without difficulty Grade View: Grade I Nasal Tubes: Nasal Rae Tube size: 7.0 mm Number of attempts: 1 Airway Equipment and Method: Video-laryngoscopy Placement Confirmation: ETT inserted through vocal cords under direct vision, positive ETCO2 and breath sounds checked- equal and bilateral Secured at: 21 cm Tube secured with: Tape Dental Injury: Teeth and Oropharynx as per pre-operative assessment

## 2023-10-30 NOTE — Op Note (Signed)
 Full Operative Report  Date of Operation: 10:27 AM, 10/30/2023   Patient: Jeremy [name] - 43 y.o. male  Surgeon: Carlena Hurl DPM  Co-surgeon: Ocie Doyne, DMD -present for separate oral procedure at same time, please see separate operative report  Assistant: None  Diagnosis:   OSTEOMYELITIS of left foot  Procedure:  1.  Irrigation and debridement of surgical wound with prep for graft and closure, 6.5 x 2.5 x 3 cm 2.  Application dermal allograft, 6.5 x 2.5 x 3 cm, left foot 3.  Application antibiotic calcium sulfate dissolvable beads, left foot 4.  Delayed primary closure of surgical wound 6.5 x 2.5 cm, left foot    Anesthesia: General  Linton Rump, MD  Anesthesiologist: Linton Rump, MD CRNA: Bartholomew Crews, CRNA   Estimated Blood Loss: Minimal   Hemostasis: 1) Anatomical dissection, mechanical compression, electrocautery 2) No tourniquet was used during the procedure  Implants: Implant Name Type Inv. Item Serial No. Manufacturer Lot No. LRB No. Used Action  GRAFT SKIN WND MICRO 38 - [id number] Tissue GRAFT SKIN WND MICRO 38  [employer] [id number] Left 1 Implanted  GRAFT SKIN WND MICRO 38 - [id number] Tissue GRAFT SKIN WND MICRO 38  [employer] [id number] Left 1 Implanted  KIT STIMULAN RAPID CURE 10CC - [id number] Orthopedic Implant KIT STIMULAN RAPID CURE 10CC  [employer] [id number] Left 1 Implanted    Materials: 2-0 Prolene  Injectables: 1) Pre-operatively: None   2) Post-operatively: None   Specimens: Pathology: None microbiology: None   Antibiotics: IV antibiotics given per schedule on the floor  Drains: None  Complications: Patient tolerated the procedure well without complication.   Operative findings: As below in detailed report  Indications for Procedure: Quintan Saldivar presents to Ocie Doyne, DMD with a chief complaint of open surgical wound on left foot status post prior incision and drainage  with further resection of third metatarsal for osteomyelitis.  The patient has failed conservative treatments of various modalities. At this time the patient has elected to proceed with surgical correction. All alternatives, risks, and complications of the procedures were thoroughly explained to the patient. Patient exhibits appropriate understanding of all discussion points and informed consent was signed and obtained in the chart with no guarantees to surgical outcome given or implied.  Description of Procedure: Patient was brought to the operating room. Patient was transferred to the operative table in the supine position.  A surgical timeout was performed and all members of the operating room, the procedure, and the surgical site were identified. anesthesia occurred as per anesthesia record.   The operative lower extremity as noted above was then prepped and draped in the usual sterile manner. The following procedure then began.  Attention was directed to the left foot.  There is surgical site noted at the left midfoot at site of prior partial third ray amputation.  There is noted to be hematoma after removal of retention sutures and black wound VAC sponge.  The hematoma was completely debrided.  No further bleeding actively was noted from within the surgical site.  Wound was then irrigated and prepped for graft application.  It was thoroughly excisionally debrided with rongeur and curette to the level of healthy bleeding tissue with removal of any necrotic or fibrotic tissues present in wound bed.  Wound did probe to bone or other deep structures.  The wound was thoroughly irrigated with 3 L of sterile saline via power pulse lavage.  Next the acellular dermal allograft as above including  Kerecis micro powder 38 cm x 2 packets are mixed with small amount of sterile saline on the back table.  At the same time Stimulan dissolvable calcium sulfate antibiotic beads were mixed on the back table with  vancomycin and tobramycin liquid.  Small beads were formed.  This was mixed with the dermal allograft as above.  The antibiotic bead and Kerecis graft mixture was then implanted deep within the surgical site so as to promote healing and decrease dead space.  Next the wound to the left foot was measured and found to be 6.5 x 2.5 x 3 cm.  The tissue margins were mobilized and found to be easily able to be placed in close apposition.  2-0 Prolene was then used in a horizontal and simple fashion to perform a delayed primary closure of the surgical site under minimal tension.  The surgical site was then dressed with Betadine Adaptic 4 x 4 Kerlix Ace. The patient tolerated both the procedure and anesthesia well with vital signs stable throughout. The patient was transferred in good condition and all vital signs stable  from the OR to recovery under the discretion of anesthesia.   Condition: Vital signs stable, neurovascular status unchanged from preoperative   Surgical plan:  Wound bed appeared healthy with no residual infected tissue noted.  Acellular dermal allograft and antibiotic beads applied.  Closure of surgical wound performed.  Nonweightbearing left foot.   Continue to follow surgical pathology though plan to treat with long-term IV antibiotics at this time regardless of result.  ABX per ID.  Okay to resume anticoagulation tomorrow.  The patient will be nonweightbearing in a postop shoe to the operative limb until further instructed. The dressing is to remain clean, dry, and intact. Will continue to follow unless noted elsewhere.   Carlena Hurl, DPM [facility]

## 2023-10-30 NOTE — Discharge Instructions (Signed)
Discharge Instructions   None       

## 2023-10-30 NOTE — Op Note (Signed)
 10/30/2023  10:22 AM  PATIENT:  Jeremy [name]  43 y.o. male  PRE-OPERATIVE DIAGNOSIS:  Impacted teeth # 1, 16, 17, 32, non-restorable teeth # 2, 15, 18, 31 secondary to DENTAL CARIES and periodontal disease   POST-OPERATIVE DIAGNOSIS:  SAME  PROCEDURE:  Procedure(s): EXTRACTIONS teeth # 1, 2, 15, 16, 17, 18, 31, 32.    SURGEON:  Surgeon(s): Ocie Doyne, DMD Standif[place], Jenelle Mages, DPM  ANESTHESIA:   local and general  EBL:  minimal  DRAINS: none   SPECIMEN:  No Specimen  COUNTS:  YES  PLAN OF CARE: Discharge to home after PACU  PATIENT DISPOSITION:  PACU - hemodynamically stable.   PROCEDURE DETAILS: Dictation # [id number]...  [name], DMD 10/30/2023 10:22 AM

## 2023-10-30 NOTE — H&P (Signed)
 H&P documentation  -History and Physical Reviewed  -Patient has been re-examined  -No change in the plan of care  Jeremy [name]

## 2023-10-30 NOTE — Transfer of Care (Signed)
 Immediate Anesthesia Transfer of Care Note  Patient: Jeremy [name]  Procedure(s) Performed: DENTAL RESTORATION/EXTRACTIONS (Mouth) IRRIGATION AND DEBRIDEMENT WOUND (Left: Foot)  Patient Location: PACU  Anesthesia Type:General  Level of Consciousness: awake and sedated  Airway & Oxygen Therapy: Patient Spontanous Breathing  Post-op Assessment: Report given to RN  Post vital signs: Reviewed and stable  Last Vitals:  Vitals Value Taken Time  BP 106/59 10/30/23 1031  Temp    Pulse 92 10/30/23 1034  Resp 22 10/30/23 1034  SpO2 100 % 10/30/23 1034  Vitals shown include unfiled device data.  Last Pain:  Vitals:   10/30/23 0830  TempSrc: Temporal  PainSc: 0-No pain      Patients Stated Pain Goal: 0 (10/29/23 0731)  Complications: No notable events documented.

## 2023-10-30 NOTE — Anesthesia Postprocedure Evaluation (Signed)
 Anesthesia Post Note  Patient: Jeremy [name]  Procedure(s) Performed: DENTAL RESTORATION/EXTRACTIONS (Mouth) IRRIGATION AND DEBRIDEMENT WOUND (Left: Foot)     Patient location during evaluation: PACU Anesthesia Type: General Level of consciousness: awake Pain management: pain level controlled Vital Signs Assessment: post-procedure vital signs reviewed and stable Respiratory status: spontaneous breathing, nonlabored ventilation and respiratory function stable Cardiovascular status: blood pressure returned to baseline and stable Postop Assessment: no apparent nausea or vomiting Anesthetic complications: no   No notable events documented.  Last Vitals:  Vitals:   10/30/23 1115 10/30/23 1130  BP: 106/61   Pulse: 79   Resp: 19   Temp:  36.5 C  SpO2: 100%     Last Pain:  Vitals:   10/30/23 1130  TempSrc:   PainSc: Asleep                 Linton Rump

## 2023-10-30 NOTE — Anesthesia Postprocedure Evaluation (Signed)
 Anesthesia Post Note  Patient: Jeremy [name]  Procedure(s) Performed: AMPUTATION RAY FOOT (Left: Foot)     Patient location during evaluation: PACU Anesthesia Type: MAC Level of consciousness: awake and alert Pain management: pain level controlled Vital Signs Assessment: post-procedure vital signs reviewed and stable Respiratory status: spontaneous breathing, nonlabored ventilation and respiratory function stable Cardiovascular status: stable and blood pressure returned to baseline Postop Assessment: no apparent nausea or vomiting Anesthetic complications: no   No notable events documented.                Janayla Marik

## 2023-10-30 NOTE — Progress Notes (Signed)
 Pharmacy Antibiotic Note  Jeremy [name] is a 43 y.o. male admitted on 10/26/2023 with  recurrent osteomyelitis following recent toe amputation . Prior to admission completed course of ciprofloxacin. Now undergoing amputation 2/19, plan for tooth extraction 2/21. Pharmacy has been consulted for vancomycin/unasyn dosing. 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  Pre HD Vancomycin level 29 is supratherapeutic (goal 15-25).  Plan: Decrease Vancomycin 750 mg IV post-HD (MWF) Ceftazidime 1 g IV qMWF at 1800 post-HD Cards w/u - AVR, dental extraction (2/21) F/u margins, culture data, plan for 6 weeks Monitor HD tolerance, weekly vancomycin level   Height: 6\' 5"  (195.6 cm) Weight: 108.9 kg (240 lb) IBW/kg (Calculated) : 89.1  Temp (24hrs), Avg:97.9 F (36.6 C), Min:97.1 F (36.2 C), Max:98.2 F (36.8 C)  Recent Labs  Lab 10/26/23 0649 10/26/23 1605 10/27/23 0335 10/28/23 0742 10/28/23 1228 10/29/23 0218 10/30/23 0737 10/30/23 0827  WBC 12.8*  --  10.4 7.6  --  8.9 8.8  --   CREATININE  --   --  11.15* 7.41* 10.00* 10.46* 13.19* 15.40*  LATICACIDVEN 2.1* 1.8  --   --   --   --   --   --   VANCORANDOM  --   --   --   --   --   --  29  --     Estimated Creatinine Clearance: 8.5 mL/min (A) (by C-G formula based on SCr of 15.4 mg/dL (H)).    Allergies  Allergen Reactions   Acyclovir And Related Hives   Imdur [Isosorbide Nitrate]     Headaches from nitrate    Antimicrobials this admission: Unasyn 2/19 >> 2/20 Ceftaz 2/20 >> Vanco 2/17 >> 2/18; 2/19 >> Cefepime 2/17 >> 2/18  Vancomycin level 2/21 Pre HD = 29, decrease 1000mg  to 750mg  post HD MWF  Microbiology results: 2/17- bcx- ngtd 2/18 MRSA PCR neg 2/18 bcx- ngtd 2/19 bone tissue- ngtd 2/19 MRSA neg   Thank you for allowing pharmacy to be a part of this patient's care.  Alphia Moh, PharmD, BCPS, BCCP Clinical Pharmacist  Please check AMION for all [facility] Pharmacy phone  numbers After 10:00 PM, call Main Pharmacy [phone]

## 2023-10-30 NOTE — Progress Notes (Signed)
 [facility] Progress Note   Subjective:    Seen and examined patient on HD. Tolerating UFG 2.5L. S/p extraction of teeth and I&D of left foot for osteomyelitis today. S/p TEE yesterday.  Objective Vitals:   10/30/23 1130 10/30/23 1200 10/30/23 1449 10/30/23 1454  BP:  (!) 106/57 (!) 118/57 (!) 113/57  Pulse:  88 85 87  Resp:  20 15 (!) 21  Temp: 97.7 F (36.5 C) 97.8 F (36.6 C) 98.1 F (36.7 C)   TempSrc:  Axillary    SpO2:  95% 100% 100%  Weight:   113.4 kg   Height:       Physical Exam General: Pleasant chronically ill appearing male in NAD Heart:S1,S2 2/6 systolic M 1/6 diastolic M RRR SR on monitor.  Lungs: CTAB A/P Abdomen: NABS, NT Extremities: Wound L foot dressed No LE edema Dialysis Access: LIJ TDC drsg intact  Filed Weights   10/28/23 0710 10/28/23 1223 10/30/23 1449  Weight: (S) 114 kg 108.9 kg 113.4 kg    Intake/Output Summary (Last 24 hours) at 10/30/2023 1506 Last data filed at 10/30/2023 1009 Gross per 24 hour  Intake 250 ml  Output 225 ml  Net 25 ml    Additional Objective Labs: Basic Metabolic Panel: Recent Labs  Lab 10/28/23 0742 10/28/23 1228 10/29/23 0218 10/30/23 0737 10/30/23 0827  NA 133*   < > 137 137 136  K 3.5   < > 4.4 4.8 5.0  CL 92*   < > 97* 98 101  CO2 25  --  24 24  --   GLUCOSE 133*   < > 108* 111* 116*  BUN 43*   < > 58* 80* 72*  CREATININE 7.41*   < > 10.46* 13.19* 15.40*  CALCIUM 9.2  --  9.2 9.2  --   PHOS 4.9*  --  8.7* 9.2*  --    < > = values in this interval not displayed.   Liver Function Tests: Recent Labs  Lab 10/26/23 0446 10/27/23 0335 10/28/23 0742 10/29/23 0218 10/30/23 0737  AST 18  --   --   --   --   ALT 15  --   --   --   --   ALKPHOS 45  --   --   --   --   BILITOT 0.7  --   --   --   --   PROT 7.8  --   --   --   --   ALBUMIN 3.1*   < > 3.1* 2.7* 2.9*   < > = values in this interval not displayed.   No results for input(s): "LIPASE", "AMYLASE" in the last 168  hours. CBC: Recent Labs  Lab 10/26/23 0335 10/26/23 0456 10/26/23 0649 10/27/23 0335 10/28/23 0742 10/28/23 1228 10/29/23 0218 10/30/23 0737 10/30/23 0827  WBC 15.1*  --  12.8* 10.4 7.6  --  8.9 8.8  --   NEUTROABS 13.3*  --   --  7.4 4.4  --   --   --   --   HGB 14.0   < > 13.3 12.0* 12.9*   < > 11.2* 10.9* 12.2*  HCT 44.8   < > 42.0 37.4* 40.6   < > 34.9* 33.5* 36.0*  MCV 87.8  --  87.9 88.2 86.2  --  86.0 85.9  --   PLT 372  --  381 320 344  --  283 296  --    < > = values  in this interval not displayed.   Blood Culture    Component Value Date/Time   SDES LEFT FOOT 10/28/2023 1419   SPECREQUEST  SWABS OF 3RD METATARSAL SAMPLE B 10/28/2023 1419   CULT  10/28/2023 1419    RARE STAPHYLOCOCCUS AUREUS CULTURE REINCUBATED FOR BETTER GROWTH SUSCEPTIBILITIES TO FOLLOW NO ANAEROBES ISOLATED; CULTURE IN PROGRESS FOR 5 DAYS    REPTSTATUS PENDING 10/28/2023 1419    Cardiac Enzymes: No results for input(s): "CKTOTAL", "CKMB", "CKMBINDEX", "TROPONINI" in the last 168 hours. CBG: Recent Labs  Lab 10/29/23 1110 10/29/23 1627 10/29/23 2135 10/30/23 0614 10/30/23 1031  GLUCAP 137* 210* 217* 124* 132*   Iron Studies: No results for input(s): "IRON", "TIBC", "TRANSFERRIN", "FERRITIN" in the last 72 hours. Lab Results  Component Value Date   INR 1.3 (H) 09/05/2023   INR 1.2 07/15/2022   INR 1.01 06/02/2016   Studies/Results: ECHO TEE Result Date: 10/29/2023    TRANSESOPHOGEAL ECHO REPORT   Patient Name:   Jeremy [name] Date of Exam: 10/29/2023 Medical Rec #:  [id number]       Height:       77.0 in Accession #:    [id number]      Weight:       240.0 lb Date of Birth:  September 01, 1981       BSA:          2.417 m Patient Age:    42 years        BP:           144/63 mmHg Patient Gender: M               HR:           105 bpm. Exam Location:  Inpatient Procedure: 3D Echo, Color Doppler, Cardiac Doppler and Transesophageal Echo            (Both Spectral and Color Flow Doppler were utilized  during            procedure). Indications:     Aortic Valve  History:         Patient has prior history of Echocardiogram examinations, most                  recent 10/26/2023.  Sonographer:     Harriette Bouillon RDCS Referring Phys:  [id number] Cyndi Bender Diagnosing Phys: Wilfred Lacy PROCEDURE: The transesophogeal probe was passed without difficulty through the esophogus of the patient. Sedation performed by different physician. The patient developed no complications during the procedure.  IMPRESSIONS  1. Left ventricular ejection fraction, by estimation, is 35%. The left ventricle has moderately decreased function. The left ventricle demonstrates global hypokinesis. The left ventricular internal cavity size was mildly to moderately dilated.  2. Right ventricular systolic function is mildly reduced. The right ventricular size is normal.  3. No left atrial/left atrial appendage thrombus was detected.  4. No PFO/ASD by color doppler.  5. There is a mobile vegetation that appears calcified on the right cusp, 0.9 cm. There is a smaller mobile vegetation on the left cusp. The right and noncoronary cusps do not fully coapt. I do not see a definite perforation. The aortic valve is tricuspid. Aortic valve regurgitation is severe. No aortic stenosis is present. Difficult to assess but there does appear to be holodiastolic flow reversal in the proximal descending thoracic aorta.  6. The mitral valve is normal in structure. Trivial mitral valve regurgitation. No evidence of mitral stenosis.  7. The dialysis catheter extends into  the right atrium, no vegetation noted.  8. 3D performed of the aortic valve. FINDINGS  Left Ventricle: Left ventricular ejection fraction, by estimation, is 35%. The left ventricle has moderately decreased function. The left ventricle demonstrates global hypokinesis. Strain imaging was not performed. The left ventricular internal cavity size was mildly to moderately dilated. Right Ventricle: The right  ventricular size is normal. No increase in right ventricular wall thickness. Right ventricular systolic function is mildly reduced. Left Atrium: Left atrial size was normal in size. No left atrial/left atrial appendage thrombus was detected. Right Atrium: Right atrial size was normal in size. Pericardium: There is no evidence of pericardial effusion. Mitral Valve: The mitral valve is normal in structure. Trivial mitral valve regurgitation. No evidence of mitral valve stenosis. Tricuspid Valve: The tricuspid valve is normal in structure. Tricuspid valve regurgitation is trivial. No evidence of tricuspid stenosis. Aortic Valve: There is a mobile vegetation that appears calcified on the right cusp, 0.9 cm. There is a smaller mobile vegetation on the left cusp. The right and noncoronary cusps do not fully coapt. I do not see a definite perforation. The aortic valve is tricuspid. Aortic valve regurgitation is severe. No aortic stenosis is present. Pulmonic Valve: The pulmonic valve was normal in structure. Pulmonic valve regurgitation is not visualized. Aorta: The aortic root is normal in size and structure. IAS/Shunts: No PFO/ASD by color doppler.  LEFT VENTRICLE PLAX 2D LVIDd:         5.80 cm LVIDs:         4.80 cm LVOT diam:     2.50 cm LVOT Area:     4.91 cm   SHUNTS Systemic Diam: 2.50 cm Dalton McleanMD Electronically signed by Wilfred Lacy Signature Date/Time: 10/29/2023/9:51:10 AM    Final (Updated)    CT MAXILLOFACIAL WO CONTRAST Result Date: 10/29/2023 CLINICAL DATA:  Preoperative evaluation for surgery tomorrow EXAM: CT MAXILLOFACIAL WITHOUT CONTRAST TECHNIQUE: Multidetector CT imaging of the maxillofacial structures was performed. Multiplanar CT image reconstructions were also generated. RADIATION DOSE REDUCTION: This exam was performed according to the departmental dose-optimization program which includes automated exposure control, adjustment of the mA and/or kV according to patient size and/or use of  iterative reconstruction technique. COMPARISON:  None Available. FINDINGS: Osseous: There are no acute displaced fractures. The bilateral lower third molars are impacted (teeth 17, 32). Erosive changes are seen within the bilateral lower second molars as result (teeth 18, 31), as well as periapical lucencies. Periapical lucencies are seen within the left upper second and third molars (teeth 15, 16). Orbits: Negative. No traumatic or inflammatory finding. Sinuses: Clear. Soft tissues: Soft tissues are unremarkable. Mild bilateral tonsillar enlargement with coarse calcifications. Airway remains patent. Limited intracranial: No significant or unexpected finding. IMPRESSION: 1. Impacted lower third molars bilaterally, with erosive changes of the lower second molars bilaterally as result. Periapical lucencies involving the bilateral lower second molars and left upper second molar. 2. No acute fractures. Electronically Signed   By: Sharlet Salina M.D.   On: 10/29/2023 12:48   EP STUDY Result Date: 10/29/2023 See surgical note for result.  DG Foot Complete Left Result Date: 10/28/2023 CLINICAL DATA:  Status post partial third ray resection. EXAM: LEFT FOOT - COMPLETE 3+ VIEW COMPARISON:  Left foot radiographs dated 10/20/2023. MRI left foot dated 10/26/2023. FINDINGS: Status post interval partial third ray resection through the proximal third metatarsal shaft with antibiotic beads in place at the level of the transsection margin. 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 No  acute osseous abnormality identified. Wound VAC in place with postsurgical soft tissue swelling and air. Vascular calcifications are noted. IMPRESSION: 1. Status post interval partial third ray resection through the proximal third metatarsal shaft with antibiotic beads in place. 2. Expected postsurgical soft tissue swelling and air with a wound VAC in place. Electronically Signed   By: Hart Robinsons M.D.   On: 10/28/2023 17:00     Medications:  anticoagulant sodium citrate     cefTAZidime (FORTAZ)  IV     vancomycin      atorvastatin  80 mg Oral Daily   carvedilol  50 mg Oral BID WC   Chlorhexidine Gluconate Cloth  6 each Topical Q0600   hydrALAZINE  50 mg Oral TID   insulin aspart  0-6 Units Subcutaneous TID WC   insulin glargine-yfgn  15 Units Subcutaneous QHS   lidocaine  1 patch Transdermal Q24H   LORazepam  0.5 mg Intravenous Once   pantoprazole  40 mg Oral Daily   scopolamine       sevelamer carbonate  2,400 mg Oral TID WC    Dialysis Orders: [facility] M[facility] 4 hrs 180NRe 500/800 113.5 kg TDC - Heparin 5000 units IV initial bolus Heparin 4000 units IV mid run - Hectorol 7 mcg IV three times per week - Mircera 50 mcg IV q 4 weeks (last dose 09/30/2023 next dose due 10/28/2023)  Assessment/Plan: Acute hypoxic resp failure - w/ sig pulm edema and volume overload, on 10 L [place] O2 on admit.  Now under OP EDW with hypotension noted.Torsemide has been Dc'd.   UF as tolerated.  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 Cardiology consulted. Severe AR-Needs valve replacement. S/p Dental extractions today. Per primary.   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 ABX will start post op. 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 Will require Ceftazidime 1g with HD EOT 12/10/2023 three times per week at OP center. I called his outpatient HD center today who confirmed having the antibiotic. Asking ID regarding concerns about TDC.  ESRD - on HD MWF. On HD. Hold heparin. Schedule around dental extractions.  HTN - BP better controlled today after volume removed. Continue home meds. Added Torsemide on non HD days.  Volume - as above, sig vol overloaded. Get wt's post HD.  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  Secondary hyperparathyroidism - Continue VDRA, sevelamer binders.  DMT2   Salome Holmes, NP [facility] 10/30/2023,3:06 PM  LOS: 4 days

## 2023-10-30 NOTE — Plan of Care (Signed)
  Problem: Coping: Goal: Ability to adjust to condition or change in health will improve Outcome: Progressing   Problem: Nutritional: Goal: Maintenance of adequate nutrition will improve Outcome: Progressing Goal: Progress toward achieving an optimal weight will improve Outcome: Progressing   Problem: Skin Integrity: Goal: Risk for impaired skin integrity will decrease Outcome: Progressing   Problem: Tissue Perfusion: Goal: Adequacy of tissue perfusion will improve Outcome: Progressing   Problem: Activity: Goal: Risk for activity intolerance will decrease Outcome: Progressing   Problem: Nutrition: Goal: Adequate nutrition will be maintained Outcome: Progressing   Problem: Coping: Goal: Level of anxiety will decrease Outcome: Progressing   Problem: Elimination: Goal: Will not experience complications related to bowel motility Outcome: Progressing Goal: Will not experience complications related to urinary retention Outcome: Progressing   Problem: Pain Managment: Goal: General experience of comfort will improve and/or be controlled Outcome: Progressing   Problem: Safety: Goal: Ability to remain free from injury will improve Outcome: Progressing   Problem: Skin Integrity: Goal: Risk for impaired skin integrity will decrease Outcome: Progressing

## 2023-10-30 NOTE — Op Note (Signed)
 NAMEHUIE, Jeremy [name] MEDICAL RECORD NO: [id number] ACCOUNT NO: [id number] DATE OF BIRTH: [birth year] FACILITY: [facility] LOCATION: [facility] PHYSICIAN: [name], DDS  Operative Report   DATE OF PROCEDURE: 10/30/2023  PREOPERATIVE DIAGNOSES:  Impacted teeth 1, 16, 17, and 32. Nonrestorable teeth numbers 2, 15, 18, and 31 secondary to dental caries and periodontal disease.  POSTOPERATIVE DIAGNOSES:  Impacted teeth 1, 16, 17, and 32. Nonrestorable teeth numbers 2, 15, 18, and 31 secondary to dental caries and periodontal disease.  PROCEDURE:  Extraction of teeth numbers 1, 2, 15, 16, 17, 18, 31, and 32.  DESCRIPTION OF PROCEDURE:  The patient was taken to the operating room and placed on the table in supine position.  General anesthesia was administered.  A nasal endotracheal tube was placed and secured.  The eyes were protected.  The patient was draped  for the oral surgery procedure.  At the same time, Dr. Carlena Hurl, DPM, was working on the left foot.  Timeout was performed for both surgeons.  Then, the throat pack was placed in the posterior pharynx.  Bupivacaine was administered at an  inferior alveolar block on the right and left side and in a palatal and buccal infiltration in the maxilla around the teeth to be removed.  A bite block was placed on the right side of the mouth.  A Sweetheart retractor was used to retract the tongue.  A  #15 blade was used to make an incision overlying tooth #17 and carried forward in the buccal and lingual sulcus around tooth #18 in the mandible and an incision was created overlying tooth #16 and carried forward in the buccal and palatal sulcus at  tooth #15.  The periosteum was reflected from these teeth.  A Stryker handpiece was used to remove bone around tooth numbers 18 and 17.  Tooth #18 was removed with the dental forceps.  Tooth #17 was removed using the Stryker handpiece to remove  additional bone and then the tooth was elevated and removed  with the dental forceps.  The sockets were curetted, debrided, irrigated, and closed with 4-0 gut in the maxilla.  Tooth #16 was elevated and removed with the dental forceps and then tooth #15  was elevated and removed with the dental forceps.  The socket was curetted, debrided, and irrigated and closed with 4-0 gut.  Attention was turned to the right side.  A 15 blade was used to make an incision around teeth numbers 1 and 2 and 31 and 32.   The periosteum was reflected from around all four of these teeth.  In the mandible, the Stryker handpiece was used to remove bone approximately at tooth #31 and around tooth #32.  Tooth #31 was removed using a dental elevator and forceps.  The tooth  fractured and the distal root was removed by removing additional bone around it and then elevating the root with the 301 elevator.  Then, tooth #32 was removed using dental forceps and rongeurs.  The sockets were curetted, debrided, irrigated, and closed  with 4-0 gut.  Then in the maxilla, teeth number 1 and 2 were removed using a 301 elevator and dental forceps.  Then, the sockets were curetted, debrided, and closed with 4-0 gut.  Then the oral cavity was irrigated and suctioned.  The throat pack was  removed.  The patient was left in the care of anesthesia for completion of foot surgery and transfer to recovery room.  ESTIMATED BLOOD LOSS:  Minimal.  COMPLICATIONS:  None  SPECIMENS:  None.  COUNTS:  Correct.   PUS D: 10/30/2023 10:29:20 am T: 10/30/2023 10:48:00 am  JOB: [id number]/ [id number]

## 2023-10-30 NOTE — Progress Notes (Signed)
 Received patient in bed to unit.  Alert and oriented.  Informed consent signed and in chart.   TX duration: 4 hours  Patient tolerated well.  Transported back to the room  Alert, without acute distress.  Hand-off given to patient's nurse.   Access used: Left internal jugular HD cath Access issues: A-V and V-A with BFR at 350 due to high arterial pressures  Total UF removed: 2.5L Medication(s) given: Elita Quick, Vancomycin, Oxycodone/Tylenol mix, see [facility]   10/30/23 1911  Vitals  Temp 98 F (36.7 C)  Temp Source Oral  BP (!) 138/54  MAP (mmHg) 77  Pulse Rate (!) 112  ECG Heart Rate (!) 114  Resp (!) 22  Oxygen Therapy  SpO2 100 %  O2 Device Room Air  During Treatment Monitoring  Duration of HD Treatment -hour(s) 4 hour(s)  HD Safety Checks Performed Yes  Intra-Hemodialysis Comments Tx completed  Dialysis Fluid Bolus Normal Saline  Bolus Amount (mL) 300 mL  Post Treatment  Dialyzer Clearance Heavily streaked  Liters Processed 84  Fluid Removed (mL) 2500 mL  Tolerated HD Treatment Yes  Post-Hemodialysis Comments tolerated well  Hemodialysis Catheter Left Internal jugular Double lumen Permanent (Tunneled)  Placement Date/Time: 07/21/22 1404   Serial / Lot #: [id number]  Expiration Date: 12/19/26  Time Out: Correct patient;Correct site;Correct procedure  Maximum sterile barrier precautions: Hand hygiene;Cap;Mask;Sterile gown;Sterile gloves;Large sterile ...  Site Condition No complications  Blue Lumen Status Flushed;Heparin locked;Dead end cap in place  Red Lumen Status Flushed;Heparin locked;Dead end cap in place  Purple Lumen Status N/A  Catheter fill solution Heparin 1000 units/ml  Catheter fill volume (Arterial) 2.3 cc  Catheter fill volume (Venous) 2.3  Dressing Type Transparent  Dressing Status Antimicrobial disc/dressing in place;Clean, Dry, Intact  Interventions Other (Comment) (deaccessed)  Drainage Description None  Dressing Change Due 11/02/23  Post treatment  catheter status Capped and Clamped     Stacie Glaze LPN [facility]

## 2023-10-31 ENCOUNTER — Encounter (HOSPITAL_COMMUNITY): Payer: Self-pay | Admitting: *Deleted

## 2023-10-31 ENCOUNTER — Other Ambulatory Visit (HOSPITAL_COMMUNITY): Payer: Self-pay

## 2023-10-31 LAB — CULTURE, BLOOD (ROUTINE X 2)
Culture: NO GROWTH
Special Requests: ADEQUATE

## 2023-10-31 NOTE — Progress Notes (Signed)
 Clarification: Patient to receive Vancomycin 1gm IV AND Ceftazidime 1gm IV with HD until 12/10/23 in outpatient. I spoke with the HD RN at [place] today who confirmed having both ABXs in stock.  Salome Holmes, NP [facility]

## 2023-10-31 NOTE — Discharge Planning (Signed)
 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 Vancomycin 1gm and Ceftazidime 1gm with HD until 12/10/23.   I discussed with Dr. Drue Second on 2/21: no need to exchange current [facility] because patient was NOT bacteremic. Blood cultures 2/17 and 2/18 NGTD.  On Coumadin?: No   OTHER/APPTS/LAB ORDERS: Please obtain weekly vancomycin troughs while on Vancomycin    D/C Meds to be reconciled by nurse after every discharge.  Completed By: Salome Holmes, NP   Reviewed by: MD:______ RN_______

## 2023-11-01 ENCOUNTER — Other Ambulatory Visit: Payer: Self-pay | Admitting: Internal Medicine

## 2023-11-01 DIAGNOSIS — N186 End stage renal disease: Secondary | ICD-10-CM

## 2023-11-01 DIAGNOSIS — E1122 Type 2 diabetes mellitus with diabetic chronic kidney disease: Secondary | ICD-10-CM

## 2023-11-01 DIAGNOSIS — K219 Gastro-esophageal reflux disease without esophagitis: Secondary | ICD-10-CM

## 2023-11-01 LAB — CULTURE, BLOOD (ROUTINE X 2)
Culture: NO GROWTH
Culture: NO GROWTH

## 2023-11-01 MED ORDER — SEVELAMER CARBONATE 800 MG PO TABS
2400.0000 mg | ORAL_TABLET | Freq: Three times a day (TID) | ORAL | 3 refills | Status: AC
  Filled 2023-11-01: qty 810, 90d supply, fill #0

## 2023-11-01 MED ORDER — INSULIN GLARGINE-YFGN 100 UNIT/ML ~~LOC~~ SOPN
15.0000 [IU] | PEN_INJECTOR | Freq: Every day | SUBCUTANEOUS | 1 refills | Status: AC
Start: 2023-11-01 — End: ?
  Filled 2023-11-01 – 2024-04-22 (×2): qty 12, 80d supply, fill #0
  Filled 2024-04-22: qty 12, 80d supply, fill #1

## 2023-11-01 MED ORDER — PANTOPRAZOLE SODIUM 40 MG PO TBEC
40.0000 mg | DELAYED_RELEASE_TABLET | Freq: Every day | ORAL | 1 refills | Status: DC
  Filled 2023-11-01: qty 30, 30d supply, fill #0
  Filled 2023-12-01: qty 30, 30d supply, fill #1
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
## 2023-11-02 ENCOUNTER — Other Ambulatory Visit: Payer: Self-pay

## 2023-11-02 ENCOUNTER — Encounter (HOSPITAL_COMMUNITY): Payer: Self-pay | Admitting: Oral Surgery

## 2023-11-02 ENCOUNTER — Other Ambulatory Visit (HOSPITAL_COMMUNITY): Payer: Self-pay

## 2023-11-02 ENCOUNTER — Telehealth: Payer: Self-pay | Admitting: Podiatry

## 2023-11-02 LAB — AEROBIC/ANAEROBIC CULTURE W GRAM STAIN (SURGICAL/DEEP WOUND): Gram Stain: NONE SEEN

## 2023-11-02 NOTE — Telephone Encounter (Signed)
 [facility] called regarding pt's current wound care status. They note that he has been discharged from the hospital and wanted to know if he is currently being seen as an outpatient at the office. They stated that care can be continued with [facility] if new orders can be sent through fax by Dr. Annamary Rummage. Fax number to send is [phone].

## 2023-11-04 ENCOUNTER — Other Ambulatory Visit (HOSPITAL_COMMUNITY): Payer: Self-pay

## 2023-11-04 MED ORDER — ONDANSETRON HCL 4 MG PO TABS
4.0000 mg | ORAL_TABLET | Freq: Every day | ORAL | 3 refills | Status: AC | PRN
  Filled 2023-11-06: qty 30, 30d supply, fill #0
  Filled 2023-11-06 – 2023-12-07 (×2): qty 90, 90d supply, fill #0
  Filled 2024-02-22: qty 30, 30d supply, fill #1
  Filled 2024-03-10: qty 30, 30d supply, fill #2
  Filled 2024-04-21: qty 30, 30d supply, fill #3
  Filled 2024-05-24: qty 30, 30d supply, fill #4
  Filled 2024-06-23: qty 30, 30d supply, fill #5
  Filled 2024-06-23: qty 30, 30d supply, fill #0
  Filled 2024-07-21: qty 30, 30d supply, fill #1
  Filled 2024-08-23: qty 30, 30d supply, fill #2
  Filled 2024-10-11: qty 30, 30d supply, fill #3

## 2023-11-05 ENCOUNTER — Other Ambulatory Visit (HOSPITAL_COMMUNITY): Payer: Self-pay

## 2023-11-05 ENCOUNTER — Encounter (HOSPITAL_COMMUNITY): Payer: Self-pay | Admitting: Cardiology

## 2023-11-05 MED ORDER — NOVOLOG FLEXPEN 100 UNIT/ML ~~LOC~~ SOPN
15.0000 [IU] | PEN_INJECTOR | Freq: Three times a day (TID) | SUBCUTANEOUS | 0 refills | Status: DC
Start: 2023-11-05 — End: 2024-02-05
  Filled 2023-11-05: qty 9, 21d supply, fill #0

## 2023-11-06 ENCOUNTER — Encounter: Payer: 59 | Admitting: Student

## 2023-11-06 ENCOUNTER — Other Ambulatory Visit (HOSPITAL_COMMUNITY): Payer: Self-pay

## 2023-11-06 ENCOUNTER — Encounter (HOSPITAL_COMMUNITY): Payer: Self-pay | Admitting: *Deleted

## 2023-11-06 MED ORDER — TRAZODONE HCL 50 MG PO TABS
50.0000 mg | ORAL_TABLET | Freq: Every evening | ORAL | 1 refills | Status: DC | PRN
  Filled 2023-11-06: qty 30, 30d supply, fill #0

## 2023-11-07 ENCOUNTER — Other Ambulatory Visit (HOSPITAL_COMMUNITY): Payer: Self-pay

## 2023-11-10 ENCOUNTER — Encounter: Payer: 59 | Admitting: Student

## 2023-11-10 ENCOUNTER — Ambulatory Visit (HOSPITAL_COMMUNITY)
Admission: RE | Admit: 2023-11-10 | Discharge: 2023-11-10 | Disposition: A | Discharge status: Home / Self Care | Payer: 59 | Source: Ambulatory Visit | Attending: Adult Health | Admitting: Adult Health

## 2023-11-10 ENCOUNTER — Encounter (HOSPITAL_COMMUNITY): Payer: Self-pay

## 2023-11-10 ENCOUNTER — Other Ambulatory Visit (HOSPITAL_COMMUNITY): Payer: Self-pay

## 2023-11-10 VITALS — BP 146/70 | HR 85 | Wt 257.0 lb

## 2023-11-10 DIAGNOSIS — I351 Nonrheumatic aortic (valve) insufficiency: Secondary | ICD-10-CM | POA: Diagnosis not present

## 2023-11-10 DIAGNOSIS — N186 End stage renal disease: Secondary | ICD-10-CM | POA: Insufficient documentation

## 2023-11-10 DIAGNOSIS — I5042 Chronic combined systolic (congestive) and diastolic (congestive) heart failure: Secondary | ICD-10-CM | POA: Insufficient documentation

## 2023-11-10 DIAGNOSIS — Z833 Family history of diabetes mellitus: Secondary | ICD-10-CM | POA: Diagnosis not present

## 2023-11-10 DIAGNOSIS — Z794 Long term (current) use of insulin: Secondary | ICD-10-CM | POA: Insufficient documentation

## 2023-11-10 DIAGNOSIS — Z8249 Family history of ischemic heart disease and other diseases of the circulatory system: Secondary | ICD-10-CM | POA: Insufficient documentation

## 2023-11-10 DIAGNOSIS — M869 Osteomyelitis, unspecified: Secondary | ICD-10-CM | POA: Insufficient documentation

## 2023-11-10 DIAGNOSIS — Z79899 Other long term (current) drug therapy: Secondary | ICD-10-CM | POA: Insufficient documentation

## 2023-11-10 DIAGNOSIS — Z992 Dependence on renal dialysis: Secondary | ICD-10-CM | POA: Insufficient documentation

## 2023-11-10 DIAGNOSIS — E1122 Type 2 diabetes mellitus with diabetic chronic kidney disease: Secondary | ICD-10-CM | POA: Insufficient documentation

## 2023-11-10 DIAGNOSIS — I10 Essential (primary) hypertension: Secondary | ICD-10-CM | POA: Diagnosis not present

## 2023-11-10 DIAGNOSIS — I498 Other specified cardiac arrhythmias: Secondary | ICD-10-CM | POA: Diagnosis not present

## 2023-11-10 DIAGNOSIS — Z8619 Personal history of other infectious and parasitic diseases: Secondary | ICD-10-CM | POA: Diagnosis not present

## 2023-11-10 DIAGNOSIS — I428 Other cardiomyopathies: Secondary | ICD-10-CM | POA: Insufficient documentation

## 2023-11-10 DIAGNOSIS — E1169 Type 2 diabetes mellitus with other specified complication: Secondary | ICD-10-CM | POA: Insufficient documentation

## 2023-11-10 DIAGNOSIS — I132 Hypertensive heart and chronic kidney disease with heart failure and with stage 5 chronic kidney disease, or end stage renal disease: Secondary | ICD-10-CM | POA: Insufficient documentation

## 2023-11-10 MED ORDER — NITROGLYCERIN 0.4 MG SL SUBL
0.4000 mg | SUBLINGUAL_TABLET | SUBLINGUAL | 3 refills | Status: AC | PRN
  Filled 2023-11-10: qty 25, 5d supply, fill #0
  Filled 2024-08-23 – 2024-10-11 (×2): qty 25, 7d supply, fill #0

## 2023-11-10 NOTE — Patient Instructions (Signed)
 Medication Changes:  NITROGLYCERIN REFILLED   Follow-Up in: AS SCHEDULED WITH DR. Shirlee Latch   At the [facility], you and your health needs are our priority. We have a designated team specialized in the treatment of Heart Failure. This Care Team includes your primary Heart Failure Specialized Cardiologist (physician), Advanced Practice Providers (APPs- Physician Assistants and Nurse Practitioners), and Pharmacist who all work together to provide you with the care you need, when you need it.   You may see any of the following providers on your designated Care Team at your next follow up:  Dr. Arvilla Meres Dr. Marca Ancona Dr. Dorthula Nettles Dr. Theresia Bough Tonye Becket, NP Robbie Lis, [place] [facility] [place], [place] Brynda Peon, NP [place] Lee, NP Karle Plumber, PharmD   Please be sure to bring in all your medications bottles to every appointment.   Need to Contact us:  If you have any questions or concerns before your next appointment please send [place] a message through [place] or call our office at [phone].    TO LEAVE A MESSAGE FOR THE NURSE SELECT OPTION 2, PLEASE LEAVE A MESSAGE INCLUDING: YOUR NAME DATE OF BIRTH CALL BACK NUMBER REASON FOR CALL**this is important as we prioritize the call backs  YOU WILL RECEIVE A CALL BACK THE SAME DAY AS LONG AS YOU CALL BEFORE 4:00 PM
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
## 2023-11-19 ENCOUNTER — Encounter: Admitting: Podiatry

## 2023-11-19 ENCOUNTER — Encounter: Admitting: Student

## 2023-11-20 ENCOUNTER — Other Ambulatory Visit (HOSPITAL_COMMUNITY): Payer: Self-pay

## 2023-11-24 ENCOUNTER — Ambulatory Visit (INDEPENDENT_AMBULATORY_CARE_PROVIDER_SITE_OTHER): Admitting: Podiatry

## 2023-11-24 DIAGNOSIS — L03119 Cellulitis of unspecified part of limb: Secondary | ICD-10-CM

## 2023-11-24 DIAGNOSIS — Z9889 Other specified postprocedural states: Secondary | ICD-10-CM

## 2023-11-24 DIAGNOSIS — L02619 Cutaneous abscess of unspecified foot: Secondary | ICD-10-CM

## 2023-11-24 DIAGNOSIS — M86172 Other acute osteomyelitis, left ankle and foot: Secondary | ICD-10-CM

## 2023-11-24 NOTE — Progress Notes (Unsigned)
  Subjective:  Patient ID: Jeremy [name], male    DOB: 1981/07/29,  MRN: [id number]   DOS: 10/30/23 Procedure: 1.  Irrigation and debridement of surgical wound with prep for graft and closure, 6.5 x 2.5 x 3 cm 2.  Application dermal allograft, 6.5 x 2.5 x 3 cm, left foot 3.  Application antibiotic calcium sulfate dissolvable beads, left foot 4.  Delayed primary closure of surgical wound 6.5 x 2.5 cm, left foot  43 y.o. male seen for post op check. ***  Review of Systems: Negative except as noted in the HPI. Denies N/V/F/Ch.   Objective:  There were no vitals filed for this visit. There is no height or weight on file to calculate BMI. Constitutional Well developed. Well nourished.  Vascular Foot warm and well perfused. Capillary refill normal to all digits.   No calf pain with palpation  Neurologic Normal speech. Oriented to person, place, and time. Epicritic sensation ***  Dermatologic ***  Orthopedic: ***   Radiographs: ***  Pathology: ***  Micro: ***  Assessment:   1. Post-operative state   2. Cellulitis and abscess of foot, except toes   3. Other acute osteomyelitis of left foot (HCC)    Plan:  Patient was evaluated and treated and all questions answered.  POD # *** s/p *** -Progressing *** -XR: *** -WB Status: *** in *** -Sutures: ***. -Medications/ABX: *** -Foot redressed. - Return in ***        Corinna Gab, DPM [facility] / [facility]

## 2023-12-01 ENCOUNTER — Other Ambulatory Visit (HOSPITAL_COMMUNITY): Payer: Self-pay

## 2023-12-01 ENCOUNTER — Encounter (HOSPITAL_COMMUNITY): Payer: Self-pay | Admitting: *Deleted

## 2023-12-04 ENCOUNTER — Other Ambulatory Visit: Payer: Self-pay

## 2023-12-04 DIAGNOSIS — N186 End stage renal disease: Secondary | ICD-10-CM

## 2023-12-07 ENCOUNTER — Other Ambulatory Visit (HOSPITAL_COMMUNITY): Payer: Self-pay

## 2023-12-07 ENCOUNTER — Encounter (HOSPITAL_COMMUNITY): Payer: Self-pay | Admitting: *Deleted

## 2023-12-10 ENCOUNTER — Other Ambulatory Visit (HOSPITAL_COMMUNITY): Payer: Self-pay

## 2023-12-10 ENCOUNTER — Telehealth: Payer: Self-pay

## 2023-12-10 ENCOUNTER — Encounter: Payer: Self-pay | Admitting: Internal Medicine

## 2023-12-10 ENCOUNTER — Ambulatory Visit: Payer: 59 | Admitting: Internal Medicine

## 2023-12-10 ENCOUNTER — Other Ambulatory Visit: Payer: Self-pay

## 2023-12-10 VITALS — BP 109/64 | HR 81 | Resp 16 | Ht 77.0 in | Wt 253.4 lb

## 2023-12-10 DIAGNOSIS — I351 Nonrheumatic aortic (valve) insufficiency: Secondary | ICD-10-CM

## 2023-12-10 DIAGNOSIS — G4700 Insomnia, unspecified: Secondary | ICD-10-CM

## 2023-12-10 DIAGNOSIS — M86172 Other acute osteomyelitis, left ankle and foot: Secondary | ICD-10-CM | POA: Diagnosis not present

## 2023-12-10 DIAGNOSIS — M542 Cervicalgia: Secondary | ICD-10-CM | POA: Diagnosis not present

## 2023-12-10 DIAGNOSIS — L089 Local infection of the skin and subcutaneous tissue, unspecified: Secondary | ICD-10-CM

## 2023-12-10 DIAGNOSIS — E11628 Type 2 diabetes mellitus with other skin complications: Secondary | ICD-10-CM

## 2023-12-10 MED ORDER — METHOCARBAMOL 500 MG PO TABS
500.0000 mg | ORAL_TABLET | Freq: Three times a day (TID) | ORAL | 0 refills | Status: DC | PRN
  Filled 2023-12-10: qty 30, 10d supply, fill #0

## 2023-12-10 NOTE — Progress Notes (Signed)
 Patient ID: Jeremy [name], male   DOB: [birth year], 43 y.o.   MRN: [id number]  HPI  Christopher Mcardle is a 43 y.o. male recently admitted for left osteomyelitis s/p I X D   H/O Aortic Valve Endocarditis -  Severe AI -  E Faecalis 2023, S/P 6 weeks Vanc/Gent through 08/28/22 -  TEE read more c/w old endocarditis. Needs valve replaced nonetheless but no urgent conditions that would warrant that to be done now.  Blood cultures from this admission are without growth.  -Dental extractions tomorrow -OP follow up with CT surgery planned.    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 3rd metatarsal bone and swab culture pending and no growth to date, preliminarily.  Bone biopsy 4th metatarsal head and 4th proximal phalanx - pending read.  With planned valve forecasted, would recommend aggressive treatment to ensure well treated - would recommend Ceftaz + Vancomycin  with HD outpatient to complete 6 weeks from surgery with EOT 4/2.   Has had dental extractions as well.  His foot is well healed   Full Operative Report   Date of Operation: 10:27 AM, 10/30/2023    Patient: [name] - 43 y.o. male   Surgeon: Russ Course DPM   Co-surgeon: Ascencion Lava, DMD -present for separate oral procedure at same time, please see separate operative report   Assistant: None   Diagnosis:   OSTEOMYELITIS of left foot   Procedure:  1.  Irrigation and debridement of surgical wound with prep for graft and closure, 6.5 x 2.5 x 3 cm 2.  Application dermal allograft, 6.5 x 2.5 x 3 cm, left foot 3.  Application antibiotic calcium  sulfate dissolvable beads, left foot 4.  Delayed primary closure of surgical wound 6.5 x 2.5 cm, left foot   Specimen Description LEFT FOOT  Special Requests  SWABS OF 3RD METATARSAL SAMPLE B  Gram Stain NO WBC SEEN NO ORGANISMS SEEN  Culture RARE STAPHYLOCOCCUS AUREUS RARE  DIPHTHEROIDS(CORYNEBACTERIUM SPECIES) Standardized susceptibility testing for this organism is not available. NO ANAEROBES ISOLATED Performed at [facility], [address] [place], [place] [address]  Report Status 11/02/2023 FINAL  Organism ID, Bacteria STAPHYLOCOCCUS AUREUS  Resulting Agency [facility]     Susceptibility   Staphylococcus aureus    MIC    CIPROFLOXACIN  <=0.5 SENSI... Sensitive    CLINDAMYCIN RESISTANT Resistant    ERYTHROMYCIN >=8 RESISTANT Resistant    GENTAMICIN  <=0.5 SENSI... Sensitive    Inducible Clindamycin POSITIVE Resistant    LINEZOLID 2 SENSITIVE Sensitive    OXACILLIN <=0.25 SENS... Sensitive    RIFAMPIN <=0.5 SENSI... Sensitive    TETRACYCLINE >=16 RESIST... Resistant    TRIMETH /SULFA  <=10 SENSIT... Sensitive    VANCOMYCIN  1 SENSITIVE Sensitive      Outpatient Encounter Medications as of 12/10/2023  Medication Sig   Accu-Chek Softclix Lancets lancets Use as directed four times daily.   acetaminophen  (TYLENOL ) 500 MG tablet Take 1,000 mg by mouth every 6 (six) hours as needed for moderate pain.   atorvastatin  (LIPITOR ) 80 MG tablet Take 1 tablet (80 mg total) by mouth daily.   blood glucose meter kit and supplies KIT Use to check blood sugar up to four times daily as directed.   carvedilol  (COREG ) 25 MG tablet Take 2 tablets (50 mg total) by mouth 2 (two) times daily.   cefTAZidime  (FORTAZ ) IVPB Inject 1 g into the vein every Monday, Wednesday, and Friday with hemodialysis. Indication:  Foot osteo Last Day of Therapy:  12/10/23 Labs - Once weekly:  CBC/D and BMP, ESR and CRP Method of administration: Per HD protocol   glucose blood (ACCU-CHEK GUIDE TEST) test strip Use as directed four times daily.   hydrALAZINE  (APRESOLINE ) 50 MG tablet Take 1 tablet (50 mg total) by mouth 3 (three) times daily.   HYDROmorphone  (DILAUDID ) 2 MG tablet Take 0.5 tablets (1 mg total) by mouth every 8 (eight) hours as needed for severe pain (pain score 7-10).    insulin  aspart (NOVOLOG  FLEXPEN) 100 UNIT/ML FlexPen Inject 15 Units into the skin 3 (three) times daily with meals.   insulin  glargine-yfgn (SEMGLEE ) 100 UNIT/ML Pen Inject 15 Units into the skin daily.   multivitamin (RENA-VIT) TABS tablet Take 1 tablet by mouth daily.   nitroGLYCERIN  (NITROSTAT ) 0.4 MG SL tablet Place 1 tablet (0.4 mg total) under the tongue every 5 (five) minutes x 3 doses as needed within 15 minutes. If chest pain does not resolve, seek medical attention.   ondansetron  (ZOFRAN ) 4 MG tablet Take 1 tablet by mouth once a day as needed (Patient not taking: Reported on 11/10/2023)   ondansetron  (ZOFRAN ) 4 MG tablet Take 1 tablet by mouth once a day as needed (Patient taking differently: Take 4 mg by mouth 3 (three) times a week.)   ondansetron  (ZOFRAN ) 4 MG tablet Take 1 tablet (4 mg total) by mouth daily as needed.   pantoprazole  (PROTONIX ) 40 MG tablet Take 1 tablet (40 mg total) by mouth daily.   sevelamer  carbonate (RENVELA ) 800 MG tablet Take 3 tablets (2,400 mg total) by mouth 3 (three) times daily with meals   traZODone  (DESYREL ) 50 MG tablet Take 1 tablet (50 mg total) by mouth at bedtime as needed for sleep.   vancomycin  IVPB Inject 1,000 mg into the vein every Monday, Wednesday, and Friday with hemodialysis. Indication:  Foot osteo Last Day of Therapy:  12/10/23 Labs - Once weekly:  CBC/D, BMP, and vancomycin  trough, ESR, CRP Method of administration:Per HD protocol   No facility-administered encounter medications on file as of 12/10/2023.     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) 10/15/2023   Shortness of breath 10/15/2023   Lone post-op atrial fibrillation (HCC), resolved 09/06/2023   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) 09/05/2023   Diabetic foot infection (HCC) 09/04/2023   Type 2 diabetes mellitus with chronic kidney disease on chronic dialysis, with long-term current use of insulin  (HCC) 09/04/2023   Abscess of left foot 09/04/2023   Pyogenic inflammation of bone (HCC) 09/04/2023   Caries 07/23/2022   Periodontal disease 07/23/2022   Loose, teeth 07/23/2022   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   Sepsis without acute organ dysfunction (HCC) 07/22/2022   HF with recovered EF 07/21/2022   Bacteremia due to Enterococcus 07/21/2022   ESRD on dialysis (HCC) 07/20/2022   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   Aortic valve insufficiency 07/18/2022   Catheter-related bloodstream infection 07/16/2022   Anemia, unspecified 10/26/2019   Coagulation defect, unspecified (HCC) 10/21/2019   Secondary hyperparathyroidism of  renal origin (HCC) 10/21/2019   Mass of soft tissue of face 09/23/2018   Onychomycosis due to dermatophyte 03/29/2018   Anemia, iron  deficiency 07/18/2016   Anemia in chronic kidney disease 07/18/2016   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, stage 5 (HCC) 05/29/2016   Scrotal abscess 11/21/2015   Suspected sleep apnea 05/24/2015   Chronic combined systolic and diastolic CHF (congestive heart failure) (HCC) 03/01/2015   Nonischemic cardiomyopathy (HCC)    Asthma    Hyperlipidemia 03/31/2012   Obesity, Class I, BMI 30-34.9 03/21/2011   Primary hypertension 03/21/2011   Gastroesophageal reflux disease 03/21/2011   Type 2 diabetes mellitus with diabetic nephropathy (HCC) 02/20/2011     Health Maintenance Due  Topic Date Due   COVID-19 Vaccine (1) Never done   DTaP/Tdap/Td (2 - Td or Tdap) 05/10/2022     Review of Systems +neck pain,  soreness x 4 days. Not sure if just msk. No fever, chills, nightsweats. Also poor sleep. Gets nightmares with melatonin Physical Exam   There were no vitals taken for this visit.   No results found for: "CD4TCELL" No results found for: "CD4TABS" No results found for: "HIV1RNAQUANT" No results found for: "HEPBSAB" Lab Results  Component Value Date   LABRPR Non Reactive 12/06/2018    CBC Lab Results  Component Value Date   WBC 8.8 10/30/2023   RBC 3.90 (L) 10/30/2023   HGB 12.2 (L) 10/30/2023   HCT 36.0 (L) 10/30/2023   PLT 296 10/30/2023   MCV 85.9 10/30/2023   MCH 27.9 10/30/2023   MCHC 32.5 10/30/2023   RDW 19.7 (H) 10/30/2023   LYMPHSABS 2.0 10/28/2023   MONOABS 0.8 10/28/2023   EOSABS 0.3 10/28/2023    BMET Lab Results  Component Value Date   NA 136 10/30/2023   K 5.0 10/30/2023   CL 101 10/30/2023   CO2 24 10/30/2023   GLUCOSE 116 (H) 10/30/2023   BUN 72 (H) 10/30/2023   CREATININE 15.40 (H) 10/30/2023   CALCIUM  9.2 10/30/2023   GFRNONAA 4 (L) 10/30/2023   GFRAA 9 (L) 10/15/2020    Lab Results  Component Value Date   ESRSEDRATE 22 (H) 12/10/2023   Lab Results  Component Value Date   CRP 10.3 (H) 12/10/2023     Assessment and Plan  Osteomyelitis = just finished vancomycin  and ceftaz on 4/2 Will check labs for osteo to see now healed. And see if still needs further oral abtx. Iv abtx has stopped Continue with dressing per podiatry but appears close to being completely healed  Hx of severe AI, needing valve replacement = after completion of IV for osteomyelitis, and healing from dental extraction, patient is slated to follow up with cardiology and CT surgery for valve replacement  Neck pain = will try robaxin   Insomnia = try otc tea/chamomille, sleep hygiene  Rtc as needed

## 2023-12-10 NOTE — Telephone Encounter (Signed)
 Fax number: [phone] Faxed [facility] orders to Discontinue IV abx as of 12/09/23 per Dr Drue Second. Also called and gave verbally to Nurse TaoToa.

## 2023-12-11 LAB — CBC WITH DIFFERENTIAL/PLATELET
Absolute Lymphocytes: 1640 {cells}/uL (ref 850–3900)
Absolute Monocytes: 847 {cells}/uL (ref 200–950)
Basophils Absolute: 92 {cells}/uL (ref 0–200)
Basophils Relative: 1.2 %
Eosinophils Absolute: 146 {cells}/uL (ref 15–500)
Eosinophils Relative: 1.9 %
HCT: 44.5 % (ref 38.5–50.0)
Hemoglobin: 14 g/dL (ref 13.2–17.1)
MCH: 28.2 pg (ref 27.0–33.0)
MCHC: 31.5 g/dL — ABNORMAL LOW (ref 32.0–36.0)
MCV: 89.7 fL (ref 80.0–100.0)
MPV: 10.6 fL (ref 7.5–12.5)
Monocytes Relative: 11 %
Neutro Abs: 4974 {cells}/uL (ref 1500–7800)
Neutrophils Relative %: 64.6 %
Platelets: 230 10*3/uL (ref 140–400)
RBC: 4.96 10*6/uL (ref 4.20–5.80)
RDW: 18.1 % — ABNORMAL HIGH (ref 11.0–15.0)
Total Lymphocyte: 21.3 %
WBC: 7.7 10*3/uL (ref 3.8–10.8)

## 2023-12-11 LAB — C-REACTIVE PROTEIN: CRP: 10.3 mg/L — ABNORMAL HIGH (ref <8.0)

## 2023-12-11 LAB — SEDIMENTATION RATE: Sed Rate: 22 mm/h — ABNORMAL HIGH (ref 0–15)

## 2023-12-16 ENCOUNTER — Ambulatory Visit (HOSPITAL_COMMUNITY): Admission: RE | Admit: 2023-12-16 | Payer: 59 | Source: Ambulatory Visit

## 2023-12-16 ENCOUNTER — Ambulatory Visit: Payer: 59 | Admitting: Vascular Surgery

## 2023-12-21 ENCOUNTER — Other Ambulatory Visit (HOSPITAL_COMMUNITY): Payer: Self-pay

## 2023-12-28 ENCOUNTER — Telehealth (HOSPITAL_COMMUNITY): Payer: Self-pay | Admitting: Cardiology

## 2023-12-28 NOTE — Telephone Encounter (Signed)
 Called to confirm/remind patient of their appointment at the [facility] on 12/28/2023.   Appointment:   [x] Confirmed  [] Left mess   [] No answer/No voice mail  [] VM Full/unable to leave message  [] Phone not in service  Patient reminded to bring all medications and/or complete list.  Confirmed patient has transportation. Gave directions, instructed to utilize valet parking.

## 2023-12-29 ENCOUNTER — Encounter (HOSPITAL_COMMUNITY): Payer: Self-pay | Admitting: Cardiology

## 2023-12-29 ENCOUNTER — Other Ambulatory Visit (HOSPITAL_COMMUNITY): Payer: Self-pay

## 2023-12-29 ENCOUNTER — Ambulatory Visit (HOSPITAL_COMMUNITY)
Admission: RE | Admit: 2023-12-29 | Discharge: 2023-12-29 | Disposition: A | Discharge status: Home / Self Care | Source: Ambulatory Visit | Attending: Cardiology | Admitting: Cardiology

## 2023-12-29 VITALS — BP 118/50 | HR 85 | Wt 251.4 lb

## 2023-12-29 DIAGNOSIS — I358 Other nonrheumatic aortic valve disorders: Secondary | ICD-10-CM

## 2023-12-29 DIAGNOSIS — I132 Hypertensive heart and chronic kidney disease with heart failure and with stage 5 chronic kidney disease, or end stage renal disease: Secondary | ICD-10-CM | POA: Diagnosis present

## 2023-12-29 DIAGNOSIS — I330 Acute and subacute infective endocarditis: Secondary | ICD-10-CM

## 2023-12-29 DIAGNOSIS — I5042 Chronic combined systolic (congestive) and diastolic (congestive) heart failure: Secondary | ICD-10-CM | POA: Diagnosis not present

## 2023-12-29 DIAGNOSIS — I351 Nonrheumatic aortic (valve) insufficiency: Secondary | ICD-10-CM

## 2023-12-29 DIAGNOSIS — R9431 Abnormal electrocardiogram [ECG] [EKG]: Secondary | ICD-10-CM | POA: Diagnosis not present

## 2023-12-29 DIAGNOSIS — I428 Other cardiomyopathies: Secondary | ICD-10-CM | POA: Diagnosis not present

## 2023-12-29 DIAGNOSIS — N186 End stage renal disease: Secondary | ICD-10-CM | POA: Diagnosis not present

## 2023-12-29 DIAGNOSIS — I5022 Chronic systolic (congestive) heart failure: Secondary | ICD-10-CM | POA: Insufficient documentation

## 2023-12-29 DIAGNOSIS — I061 Rheumatic aortic insufficiency: Secondary | ICD-10-CM | POA: Diagnosis not present

## 2023-12-29 DIAGNOSIS — E1122 Type 2 diabetes mellitus with diabetic chronic kidney disease: Secondary | ICD-10-CM | POA: Diagnosis not present

## 2023-12-29 LAB — BASIC METABOLIC PANEL WITH GFR
Anion gap: 19 — ABNORMAL HIGH (ref 5–15)
BUN: 56 mg/dL — ABNORMAL HIGH (ref 6–20)
CO2: 21 mmol/L — ABNORMAL LOW (ref 22–32)
Calcium: 9.6 mg/dL (ref 8.9–10.3)
Chloride: 98 mmol/L (ref 98–111)
Creatinine, Ser: 12.82 mg/dL — ABNORMAL HIGH (ref 0.61–1.24)
GFR, Estimated: 4 mL/min — ABNORMAL LOW
Glucose, Bld: 138 mg/dL — ABNORMAL HIGH (ref 70–99)
Potassium: 5.4 mmol/L — ABNORMAL HIGH (ref 3.5–5.1)
Sodium: 138 mmol/L (ref 135–145)

## 2023-12-29 LAB — CBC
HCT: 46.4 % (ref 39.0–52.0)
Hemoglobin: 14.3 g/dL (ref 13.0–17.0)
MCH: 27.7 pg (ref 26.0–34.0)
MCHC: 30.8 g/dL (ref 30.0–36.0)
MCV: 89.7 fL (ref 80.0–100.0)
Platelets: 294 10*3/uL (ref 150–400)
RBC: 5.17 MIL/uL (ref 4.22–5.81)
RDW: 17.9 % — ABNORMAL HIGH (ref 11.5–15.5)
WBC: 14.8 10*3/uL — ABNORMAL HIGH (ref 4.0–10.5)
nRBC: 0 % (ref 0.0–0.2)

## 2023-12-29 LAB — BRAIN NATRIURETIC PEPTIDE: B Natriuretic Peptide: 401.1 pg/mL — ABNORMAL HIGH (ref 0.0–100.0)

## 2023-12-29 NOTE — Progress Notes (Signed)
 HF MD: Dr Mitzie Anda ID: Dr Levern Reader Vascular: Dr Vikki Graves   Chief Complaint: Heart Failure  HPI: Jeremy [name] is a 43 y.o. male with history of NICM, HTN, DMII, AI, ESRD.  History of aortic valve endocarditis in 2023 treated with IV antibiotics.   Cardiac cath 2016 showed no significant CAD.  Echo in 5/16 showed EF 20-25%.  By 4/17, echo showed EF up to 50%.   Admitted 11/23 with E faecalis bacteremia, source felt to be right IJ dialysis catheter. Echo showed EF 50-55%, grade II DD, RV norma, moderate AI with ? vegetation. TEE showed normal LV function, borderline severe AI, concern for AoV leaflet perforation from infective endocarditis. ID consulted, he was started on IV ampicillin /ceftriaxone  and his dialysis catheter was removed. CVTS consulted and recommended AVR. Planned to be cleared by dental surgery before considering AVR. Dental surgeon saw him and made recommendation to remove at least 8 teeth. CTA showed no evidence of CAD, coronary calcium  score of 0. After his line holiday, tunneled catheter placed for dialysis. He was then discharged home, with plans to follow up with TCTS after teeth extraction. However, he was lost to followup and did not come back to cardiology clinic or TCTS clinic.    Re-admitted 1/24 with acute dyspnea after missing HD. Placed on BiPap and IV nitro gtt. Had urgent dialysis with resolution of symptoms.  Admitted 10/26/23 with hypoxic respiratory failure, flash pulmonary edema. Echo showed EF 35-40%, severe AI. TEE showed EF 35%, severe aortic regurgitation with mobile vegetation that appeared calcified and old. ID consulted.   Multiple teeth extracted. Also treated for osteomyelitis of the 3rd metatarsal left foot. He has now completed antibiotics for osteomyelitis.   He returns for followup of CHF. He is short of breath if he "moves fast."  He does fine walking on flat ground but is short of breath walking up stairs.  No chest pain.  No lightheadedness or syncope.   No palpitations.    ECG (personally reviewed): NSR, LVH with repolarization abnormality.   Labs (2/25): LDL 83 Labs (4/25): hgb 14  PMH: 1. Type II diabetes since 2006.  2. HTN since around 2010.   3. Asthma 4. Hyperlipidemia 5. Morbid obesity 6. Cardiomyopathy: Nonischemic.  Echo (5/16) with EF 20-25%, diffuse hypokinesis, grade II diastolic dysfunction.  Lexiscan  Cardiolite  (5/16) with EF 28%, inferior/inferolateral/apical ischemia.  RHC/LHC (5/16) with normal coronaries, mean RA 11, PA 33/21 mean 28, mean PCWP 18, CI 2.53.  - Echo (4/17): EF 50%, moderate LVH, normal RV size and systolic function. Grade IDD  - Echo (9/17): EF 55%.  - Echo (5/19): EF 50-55%, moderate LVH, normal RV size and systolic function.  - Echo (10/20): EF 55-60%, normal RV.  - Echo (11/23): EF 50-55%, grade II DD, normal RV, moderate AI w/ ? Vegetation - TEE (2/25): EF 35%, global hypokinesis, mild RV dysfunction, severe AI, aortic valve with mobile calcified vegetations on aortic valve.  7. ABIs normal 8/16.  8. ESRD: AKI in 9/17 with diagnosis of nephrotic syndrome. 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  9. COVID-19 infection 2/21.  10. Aortic valve endocarditis: TEE (11/23) showed EF 55-60%, normal RV, no LAA, + SVC thrombus, borderline severe AI with concern for AoV leaflet perforation 2/2 infective endocarditis.  - TEE (2/25) with EF 35%, global hypokinesis, mild RV dysfunction, severe AI, aortic valve with mobile calcified vegetations on aortic valve.  11. 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   ROS: All systems negative  except as listed in HPI, PMH and Problem List.  SH:  Social History   Socioeconomic History   Marital status: Single    Spouse name: Not on file   Number of children: 2   Years of education: Not on file   Highest education level: Some college, no degree  Occupational History   Not on file  Tobacco Use   Smoking  status: Never   Smokeless tobacco: Never  Vaping Use   Vaping status: Never Used  Substance and Sexual Activity   Alcohol use: No    Alcohol/week: 0.0 standard drinks of alcohol   Drug use: Yes    Types: Marijuana    Comment: last used 09/15/2020   Sexual activity: Yes    Partners: Female  Other Topics Concern   Not on file  Social History Narrative   Lives in [place] by himself.  [facility] in [facility].   Caffeine- none       Social Drivers of Corporate investment banker Strain: Not on file  Food Insecurity: No Food Insecurity (10/27/2023)   Hunger Vital Sign    Worried About Running Out of Food in the Last Year: Never true    Ran Out of Food in the Last Year: Never true  Transportation Needs: No Transportation Needs (10/27/2023)   PRAPARE - Administrator, Civil Service (Medical): No    Lack of Transportation (Non-Medical): No  Physical Activity: Insufficiently Active (11/27/2021)   Exercise Vital Sign    Days of Exercise per Week: 2 days    Minutes of Exercise per Session: 30 min  Stress: Stress Concern Present (11/27/2021)   [employer] of Occupational Health - Occupational Stress Questionnaire    Feeling of Stress : To some extent  Social Connections: Unknown (02/17/2023)   Received from [facility]   Social Network    Social Network: Not on file  Intimate Partner Violence: Not At Risk (10/27/2023)   Humiliation, Afraid, Rape, and Kick questionnaire    Fear of Current or Ex-Partner: No    Emotionally Abused: No    Physically Abused: No    Sexually Abused: No    FH:  Family History  Problem Relation Age of Onset   Diabetes Mellitus II Mother        died @ 65.   Gastric cancer Mother    CAD Father        died @ 53.   Heart attack Father    Congestive Heart Failure Father    Diabetes Mellitus II Sister    CAD Sister        s/p PCI - age 69.   Current Outpatient Medications  Medication Sig Dispense Refill   Accu-Chek Softclix  Lancets lancets Use as directed four times daily. 100 each 0   acetaminophen  (TYLENOL ) 500 MG tablet Take 1,000 mg by mouth every 6 (six) hours as needed for moderate pain.     atorvastatin  (LIPITOR ) 80 MG tablet Take 1 tablet (80 mg total) by mouth daily. 90 tablet 3   blood glucose meter kit and supplies KIT Use to check blood sugar up to four times daily as directed. 1 each 0   carvedilol  (COREG ) 25 MG tablet Take 2 tablets (50 mg total) by mouth 2 (two) times daily. 120 tablet 11   glucose blood (ACCU-CHEK GUIDE TEST) test strip Use as directed four times daily. 100 strip 0   hydrALAZINE  (APRESOLINE ) 50 MG tablet Take 1 tablet (50  mg total) by mouth 3 (three) times daily. 270 tablet 3   HYDROmorphone  (DILAUDID ) 2 MG tablet Take 0.5 tablets (1 mg total) by mouth every 8 (eight) hours as needed for severe pain (pain score 7-10). 9 tablet 0   insulin  aspart (NOVOLOG  FLEXPEN) 100 UNIT/ML FlexPen Inject 15 Units into the skin 3 (three) times daily with meals. 9 mL 0   insulin  glargine-yfgn (SEMGLEE ) 100 UNIT/ML Pen Inject 15 Units into the skin daily. 15 mL 1   methocarbamol  (ROBAXIN ) 500 MG tablet Take 1 tablet (500 mg total) by mouth every 8 (eight) hours as needed for muscle spasms. 30 tablet 0   multivitamin (RENA-VIT) TABS tablet Take 1 tablet by mouth daily.     nitroGLYCERIN  (NITROSTAT ) 0.4 MG SL tablet Place 1 tablet (0.4 mg total) under the tongue every 5 (five) minutes x 3 doses as needed within 15 minutes. If chest pain does not resolve, seek medical attention. 25 tablet 3   ondansetron  (ZOFRAN ) 4 MG tablet Take 1 tablet (4 mg total) by mouth daily as needed. 90 tablet 3   pantoprazole  (PROTONIX ) 40 MG tablet Take 1 tablet (40 mg total) by mouth daily. 30 tablet 1   sevelamer  carbonate (RENVELA ) 800 MG tablet Take 3 tablets (2,400 mg total) by mouth 3 (three) times daily with meals 810 tablet 3   traZODone  (DESYREL ) 50 MG tablet Take 1 tablet (50 mg total) by mouth at bedtime as needed for  sleep. 30 tablet 1   No current facility-administered medications for this encounter.    Vitals:   12/29/23 0921  BP: (!) 118/50  Pulse: 85  SpO2: 99%  Weight: 114 kg (251 lb 6.4 oz)   Wt Readings from Last 3 Encounters:  12/29/23 114 kg (251 lb 6.4 oz)  12/10/23 114.9 kg (253 lb 6.4 oz)  11/10/23 116.6 kg (257 lb)   PHYSICAL EXAM: General: NAD Neck: No JVD, no thyromegaly or thyroid nodule.  Lungs: Clear to auscultation bilaterally with normal respiratory effort. CV: Nondisplaced PMI.  Heart regular S1/S2, no S3/S4, 2/6 diastolic murmur along the upper sternal border.  No peripheral edema.  No carotid bruit.  Normal pedal pulses.  Abdomen: Soft, nontender, no hepatosplenomegaly, no distention.  Skin: Intact without lesions or rashes.  Neurologic: Alert and oriented x 3.  Psych: Normal affect. Extremities: No clubbing or cyanosis.  HEENT: Normal.   ASSESSMENT & PLAN: 1. Chronic systolic CHF: Nonischemic cardiomyopathy.  LHC in 2016 with no significant CAD. TEE in 2/25 with EF 35%, global hypokinesis, mild RV dysfunction, severe AI, aortic valve with mobile calcified chronic vegetations.  Fall in LV EF from 2023 (echo with EF 50-55%) is most likely due to long-standing severe aortic insufficiency. NYHA class II symptoms.  He is not volume overloaded on exam.  - Volume managed by HD.  - Needs aortic valve replacement.  - Continue Coreg  50 mg bid.  - He is on hydralazine  50 tid, cannot tolerate Imdur  due to headaches.  2. Aortic insufficiency: Severe AI on TEE in 2023 in setting of aortic valve endocarditis from E faecalis.  AVR recommended but patient lost to followup.  Repeat TEE in 2/25 showed severe AI, aortic valve with mobile calcified chronic vegetations. EF now down to 35%.  He needs aortic valve replacement.  Given ESRD, suspect best option will be bioprosthetic AVR.  He has had tooth extractions.  - I will arrange for pre-op  RHC/LHC.  Discussed risks/benefits with patient  and he agrees to the procedure.  -  Refer to TCTS for AVR evaluation.  3. Left foot osteomyelitis: He has completed antibiotics and has been cleared by ID.  4. ESRD: chronic HD.  5. HTN: BP controlled on current regimen with Coreg  and hydralazine .   Follow up with APP in 3 months.   I spent 41 minutes reviewing records, interviewing/examining patient, and managing orders.   Peder Bourdon  12/29/2023

## 2023-12-29 NOTE — Patient Instructions (Addendum)
 There has been no changes to your medications.  Labs done today, your results will be available in MyChart, we will contact you for abnormal readings.  You are scheduled for a Cardiac Catheterization on Thursday, May 15 with Dr. Peder Bourdon.  1. Please arrive at the [facility] (Main Entrance A) at [facility]: [address] [place], [place] [address] at 5:30 AM (This time is 2 hour(s) before your procedure to ensure your preparation).   Free valet parking service is available. You will check in at ADMITTING. The support person will be asked to wait in the waiting room.  It is OK to have someone drop you off and come back when you are ready to be discharged.    Special note: Every effort is made to have your procedure done on time. Please understand that emergencies sometimes delay scheduled procedures.  2. Diet: Do not eat solid foods after midnight.  The patient may have clear liquids until 5am upon the day of the procedure.  3. Medication instructions in preparation for your procedure:   Contrast Allergy: No  TAKE half dose of your Insulin  the evening before your procedure. Hold all insulins the morning of your procedure.  On the morning of your procedure, take your any morning medicines NOT listed above.  You may use sips of water.  5. Plan to go home the same day, you will only stay overnight if medically necessary. 6. Bring a current list of your medications and current insurance cards. 7. You MUST have a responsible person to drive you home. 8. Someone MUST be with you the first 24 hours after you arrive home or your discharge will be delayed. 9. Please wear clothes that are easy to get on and off and wear slip-on shoes.   You have been referred to [facility]. His office will call you to arrange your appointment.  Your physician recommends that you schedule a follow-up appointment in: 3 months.  If you have any questions or concerns before your next appointment  please send us  a message through [place] or call our office at [phone].    TO LEAVE A MESSAGE FOR THE NURSE SELECT OPTION 2, PLEASE LEAVE A MESSAGE INCLUDING: YOUR NAME DATE OF BIRTH CALL BACK NUMBER REASON FOR CALL**this is important as we prioritize the call backs  YOU WILL RECEIVE A CALL BACK THE SAME DAY AS LONG AS YOU CALL BEFORE 4:00 PM  At the [facility], you and your health needs are our priority. As part of our continuing mission to provide you with exceptional heart care, we have created designated Provider Care Teams. These Care Teams include your primary Cardiologist (physician) and Advanced Practice Providers (APPs- Physician Assistants and Nurse Practitioners) who all work together to provide you with the care you need, when you need it.   You may see any of the following providers on your designated Care Team at your next follow up: Dr Jules Oar Dr Peder Bourdon Dr. Alwin Baars Dr. Arta Lark Amy Marijane Shoulders, NP Ruddy Corral, [place] [facility] [place], [place] Dennise Fitz, NP [place] Lee, NP Shawnee Dellen, NP Luster Salters, PharmD Bevely Brush, PharmD   Please be sure to bring in all your medications bottles to every appointment.    Thank you for choosing [facility]      Your physician recommends that you schedule a follow-up appointment in: 3 months

## 2024-01-01 ENCOUNTER — Other Ambulatory Visit (HOSPITAL_COMMUNITY): Payer: Self-pay

## 2024-01-01 ENCOUNTER — Other Ambulatory Visit: Payer: Self-pay | Admitting: Internal Medicine

## 2024-01-01 ENCOUNTER — Encounter (HOSPITAL_COMMUNITY): Payer: Self-pay | Admitting: *Deleted

## 2024-01-01 DIAGNOSIS — K219 Gastro-esophageal reflux disease without esophagitis: Secondary | ICD-10-CM

## 2024-01-01 MED ORDER — PANTOPRAZOLE SODIUM 40 MG PO TBEC
40.0000 mg | DELAYED_RELEASE_TABLET | Freq: Every day | ORAL | 1 refills | Status: DC
  Filled 2024-01-01: qty 30, 30d supply, fill #0

## 2024-01-01 NOTE — Telephone Encounter (Signed)
 Medication sent to pharmacy

## 2024-01-04 ENCOUNTER — Other Ambulatory Visit (HOSPITAL_COMMUNITY): Payer: Self-pay

## 2024-01-04 MED ORDER — HYDRALAZINE HCL 50 MG PO TABS
50.0000 mg | ORAL_TABLET | Freq: Three times a day (TID) | ORAL | 3 refills | Status: AC
  Filled 2024-01-04: qty 270, 90d supply, fill #0
  Filled 2024-06-11: qty 270, 90d supply, fill #1
  Filled 2024-09-17: qty 90, 30d supply, fill #2
  Filled 2024-10-11: qty 90, 30d supply, fill #3

## 2024-01-05 ENCOUNTER — Ambulatory Visit (HOSPITAL_COMMUNITY): Admission: RE | Admit: 2024-01-05 | Source: Ambulatory Visit

## 2024-01-05 ENCOUNTER — Ambulatory Visit (HOSPITAL_COMMUNITY)

## 2024-01-13 ENCOUNTER — Telehealth (HOSPITAL_COMMUNITY): Payer: Self-pay

## 2024-01-13 NOTE — Telephone Encounter (Signed)
 Approved [id number] R&L heart cath Auth#[id number] valid until 02/25/24

## 2024-01-13 NOTE — Telephone Encounter (Signed)
-----   Message from Nurse Emer C sent at 12/29/2023 10:02 AM EDT ----- Regarding: PA  Test: R & L Cath  Insurance: [facility]  CPT: [id number]   [facility]  Dx: AVR   Provider: Mitzie Anda  Scheduled Date: 01/21/24

## 2024-01-15 DIAGNOSIS — N186 End stage renal disease: Secondary | ICD-10-CM | POA: Diagnosis not present

## 2024-01-15 DIAGNOSIS — D689 Coagulation defect, unspecified: Secondary | ICD-10-CM | POA: Diagnosis not present

## 2024-01-15 DIAGNOSIS — T8249XA Other complication of vascular dialysis catheter, initial encounter: Secondary | ICD-10-CM | POA: Diagnosis not present

## 2024-01-15 DIAGNOSIS — Z992 Dependence on renal dialysis: Secondary | ICD-10-CM | POA: Diagnosis not present

## 2024-01-15 DIAGNOSIS — N2581 Secondary hyperparathyroidism of renal origin: Secondary | ICD-10-CM | POA: Diagnosis not present

## 2024-01-20 ENCOUNTER — Encounter (HOSPITAL_COMMUNITY): Payer: Self-pay | Admitting: *Deleted

## 2024-01-20 ENCOUNTER — Encounter (HOSPITAL_COMMUNITY): Payer: Self-pay | Admitting: Cardiology

## 2024-01-20 ENCOUNTER — Ambulatory Visit: Attending: Vascular Surgery | Admitting: Vascular Surgery

## 2024-01-20 ENCOUNTER — Other Ambulatory Visit (HOSPITAL_COMMUNITY): Payer: Self-pay

## 2024-01-20 MED ORDER — ATORVASTATIN CALCIUM 80 MG PO TABS
80.0000 mg | ORAL_TABLET | Freq: Every day | ORAL | 3 refills | Status: AC
  Filled 2024-01-20: qty 90, 90d supply, fill #0
  Filled 2024-04-21: qty 90, 90d supply, fill #1
  Filled 2024-07-21: qty 90, 90d supply, fill #2
  Filled 2024-10-11: qty 90, 90d supply, fill #3

## 2024-01-20 MED ORDER — CARVEDILOL 25 MG PO TABS
25.0000 mg | ORAL_TABLET | Freq: Two times a day (BID) | ORAL | 3 refills | Status: DC
  Filled 2024-01-20 (×2): qty 180, 90d supply, fill #0

## 2024-01-21 ENCOUNTER — Other Ambulatory Visit: Payer: Self-pay

## 2024-01-21 ENCOUNTER — Other Ambulatory Visit (HOSPITAL_COMMUNITY): Payer: Self-pay

## 2024-01-21 ENCOUNTER — Encounter (HOSPITAL_COMMUNITY)
Admission: RE | Disposition: A | Discharge status: Home / Self Care | Payer: Self-pay | Source: Home / Self Care | Attending: Internal Medicine

## 2024-01-21 ENCOUNTER — Encounter (HOSPITAL_COMMUNITY): Payer: Self-pay | Admitting: Internal Medicine

## 2024-01-21 ENCOUNTER — Ambulatory Visit (HOSPITAL_COMMUNITY)
Admission: RE | Admit: 2024-01-21 | Discharge: 2024-01-21 | Disposition: A | Discharge status: Home / Self Care | Attending: Internal Medicine | Admitting: Internal Medicine

## 2024-01-21 DIAGNOSIS — Z794 Long term (current) use of insulin: Secondary | ICD-10-CM | POA: Insufficient documentation

## 2024-01-21 DIAGNOSIS — T82898A Other specified complication of vascular prosthetic devices, implants and grafts, initial encounter: Secondary | ICD-10-CM | POA: Diagnosis present

## 2024-01-21 DIAGNOSIS — T8249XA Other complication of vascular dialysis catheter, initial encounter: Secondary | ICD-10-CM | POA: Diagnosis not present

## 2024-01-21 DIAGNOSIS — I132 Hypertensive heart and chronic kidney disease with heart failure and with stage 5 chronic kidney disease, or end stage renal disease: Secondary | ICD-10-CM | POA: Diagnosis not present

## 2024-01-21 DIAGNOSIS — I509 Heart failure, unspecified: Secondary | ICD-10-CM | POA: Insufficient documentation

## 2024-01-21 DIAGNOSIS — E1122 Type 2 diabetes mellitus with diabetic chronic kidney disease: Secondary | ICD-10-CM | POA: Insufficient documentation

## 2024-01-21 DIAGNOSIS — Z992 Dependence on renal dialysis: Secondary | ICD-10-CM | POA: Diagnosis not present

## 2024-01-21 DIAGNOSIS — N186 End stage renal disease: Secondary | ICD-10-CM | POA: Diagnosis not present

## 2024-01-21 HISTORY — PX: TUNNELLED CATHETER EXCHANGE: CATH118373

## 2024-01-21 SURGERY — TUNNELLED CATHETER EXCHANGE

## 2024-01-21 SURGERY — RIGHT/LEFT HEART CATH AND CORONARY ANGIOGRAPHY
Anesthesia: LOCAL

## 2024-01-21 MED ORDER — MIDAZOLAM HCL 2 MG/2ML IJ SOLN
INTRAMUSCULAR | Status: DC | PRN
  Administered 2024-01-21: 1 mg via INTRAVENOUS

## 2024-01-21 MED ORDER — FENTANYL CITRATE (PF) 100 MCG/2ML IJ SOLN
INTRAMUSCULAR | Status: DC | PRN
  Administered 2024-01-21: 50 ug via INTRAVENOUS

## 2024-01-21 MED ORDER — HEPARIN SODIUM (PORCINE) 1000 UNIT/ML IJ SOLN
INTRAMUSCULAR | Status: DC | PRN
  Administered 2024-01-21: 5000 [IU] via INTRAVENOUS

## 2024-01-21 MED ORDER — FENTANYL CITRATE (PF) 100 MCG/2ML IJ SOLN
INTRAMUSCULAR | Status: AC
  Filled 2024-01-21: qty 2

## 2024-01-21 MED ORDER — MIDAZOLAM HCL 2 MG/2ML IJ SOLN
INTRAMUSCULAR | Status: AC
  Filled 2024-01-21: qty 2

## 2024-01-21 MED ORDER — LIDOCAINE HCL (PF) 1 % IJ SOLN
INTRAMUSCULAR | Status: AC
  Filled 2024-01-21: qty 30

## 2024-01-21 MED ORDER — HEPARIN SODIUM (PORCINE) 1000 UNIT/ML IJ SOLN
INTRAMUSCULAR | Status: AC
  Filled 2024-01-21: qty 10

## 2024-01-21 MED ORDER — HEPARIN (PORCINE) IN NACL 1000-0.9 UT/500ML-% IV SOLN
INTRAVENOUS | Status: DC | PRN
  Administered 2024-01-21: 500 mL

## 2024-01-21 SURGICAL SUPPLY — 5 items
BALLOON MUSTANG 8X80X75 (BALLOONS) IMPLANT
CATH PALINDROME 14.5FR 28 (CATHETERS) IMPLANT
CATH SLIP KMP 65CM 5FR (CATHETERS) IMPLANT
GLIDEWIRE STIFF .35X180X3 HYDR (WIRE) IMPLANT
SYR MEDALLION 10ML (SYRINGE) IMPLANT

## 2024-01-21 NOTE — Discharge Instructions (Signed)

## 2024-01-21 NOTE — H&P (Signed)
 [facility]  Vascular Access Procedure H&P  Reason for Consultation: nonfunctioning HD catheter Requesting Provider: Dr. Cindra Cree  HPI: Jeremy [name] is an 43 y.o. male with ESRD on HD, HL, obesity, h/o seizures, DM,  HTN, HICM, h/o aortic endocarditis 2023 who presents for [facility] exchange for nonfunctioning HD catheter.   Pt referred from his HD clinic for [facility] exchange.  No recent infections, f/c, exit site pain or drainage.  He cannot recall when [facility] was placed but per review of records it appears 07/2022 here.   He confirms no h/o contrast allergy, NPO status, has a driver.   PMH: Past Medical History:  Diagnosis Date   Abscess of left groin    Acute blood loss anemia 11/11/2013   Anemia in chronic kidney disease (CKD)    Asthma    Boil of scrotum 11/21/2015   Chest pain    a. 01/2015 Lexiscan  MV: EF 28%, inferior, inferolateral, apical ischemia;  b. 01/2015 Cath: nl cors, PCWP 18 mmHg, CO 9.38 L/min, CI 3.53 L/min/m^2.   CHF (congestive heart failure) (HCC)    Depression    Situational   ESRD (end stage renal disease) (HCC)    [place]- [place]   Essential hypertension    Family history of adverse reaction to anesthesia    sister- "it was too much for her heart" died   Flash pulmonary edema (HCC) 09/18/2022   GERD (gastroesophageal reflux disease)    Hyperlipidemia    Membranous glomerulonephritis    Morbid obesity (HCC)    Nonischemic cardiomyopathy (HCC)    a. 01/2015 Echo: EF 20-25%, diff HK, Gr 2 DD, Triv AI, mildly dil LA and Ao root.   PONV (postoperative nausea and vomiting)    Seizures (HCC) 02/29/2020   "electralytes were out of wack."   Type II diabetes mellitus (HCC)    a. 01/2015 HbA1c = 8.9.   PSH: Past Surgical History:  Procedure Laterality Date   A/V FISTULAGRAM N/A 08/12/2021   Procedure: A/V FISTULAGRAM;  Surgeon: Adine Hoof, MD;  Location: [facility] INVASIVE CV LAB;  Service: Cardiovascular;  Laterality: N/A;   AMPUTATION Left  10/28/2023   Procedure: AMPUTATION RAY FOOT;  Surgeon: Evertt Hoe, DPM;  [facility]: [facility] OR;  Service: Orthopedics/Podiatry;  Laterality: Left;  Partial 3rd ray resection, I&D, antibiotic beads, wound vac   AV FISTULA PLACEMENT Right 12/18/2020   Procedure: RIGHT ARM RADIOCEPHALIC  ARTERIOVENOUS (AV) FISTULA CREATION;  Surgeon: Adine Hoof, MD;  Location: [facility] OR;  Service: Vascular;  Laterality: Right;   CARDIAC CATHETERIZATION N/A 02/01/2015   Procedure: Right/Left Heart Cath and Coronary Angiography;  Surgeon: Loyde Rule, MD;  Location: [facility] INVASIVE CV LAB;  Service: Cardiovascular;  Laterality: N/A;   INCISION AND DRAINAGE Left 09/05/2023   Procedure: INCISION AND DRAINAGE WITH 3RD TOE METATARSAL RESECTION LEFT FOOT AND 4TH TOE PROXIMAL PHALANX RESECTION;  Surgeon: Evertt Hoe, DPM;  Location: [facility] OR;  Service: Orthopedics/Podiatry;  Laterality: Left;  I&D with possible metatarsal resection, left foot   IR FLUORO GUIDE CV LINE LEFT  07/21/2022   IR FLUORO GUIDE CV LINE LEFT  07/21/2022   IR FLUORO GUIDE CV LINE RIGHT  10/11/2019   IR REMOVAL TUN CV CATH W/O FL  07/17/2022   IR REMOVAL TUN CV CATH W/O FL  08/18/2022   IR US  GUIDE VASC ACCESS LEFT  07/21/2022   IR US  GUIDE VASC ACCESS LEFT  07/21/2022   IR US  GUIDE VASC ACCESS RIGHT  10/11/2019  IRRIGATION AND DEBRIDEMENT FOOT Left 10/30/2023   Procedure: IRRIGATION AND DEBRIDEMENT WOUND;  Surgeon: Evertt Hoe, DPM;  [facility]: [facility] OR;  Service: Orthopedics/Podiatry;  Laterality: Left;   TEE WITHOUT CARDIOVERSION N/A 07/18/2022   Procedure: TRANSESOPHAGEAL ECHOCARDIOGRAM (TEE);  Surgeon: Bridgette Campus, MD;  Location: [facility] ENDOSCOPY;  Service: Cardiovascular;  Laterality: N/A;   THORACOTOMY Left 11/08/2013   Procedure: LEFT THORACOTOMY;  Surgeon: Heriberto London, MD;  Location: [facility] OR;  Service: Thoracic;  Laterality: Left;   TOOTH EXTRACTION N/A 10/30/2023   Procedure: DENTAL RESTORATION/EXTRACTIONS;   Surgeon: Ascencion Lava, DMD;  Location: [facility] OR;  Service: Oral Surgery;  Laterality: N/A;   TRANSESOPHAGEAL ECHOCARDIOGRAM (CATH LAB) N/A 10/29/2023   Procedure: TRANSESOPHAGEAL ECHOCARDIOGRAM;  Surgeon: Darlis Eisenmenger, MD;  Location: [facility] INVASIVE CV LAB;  Service: Cardiovascular;  Laterality: N/A;    Past Medical History:  Diagnosis Date   Abscess of left groin    Acute blood loss anemia 11/11/2013   Anemia in chronic kidney disease (CKD)    Asthma    Boil of scrotum 11/21/2015   Chest pain    a. 01/2015 Lexiscan  MV: EF 28%, inferior, inferolateral, apical ischemia;  b. 01/2015 Cath: nl cors, PCWP 18 mmHg, CO 9.38 L/min, CI 3.53 L/min/m^2.   CHF (congestive heart failure) (HCC)    Depression    Situational   ESRD (end stage renal disease) (HCC)    TTHS- [place]   Essential hypertension    Family history of adverse reaction to anesthesia    sister- "it was too much for her heart" died   Flash pulmonary edema (HCC) 09/18/2022   GERD (gastroesophageal reflux disease)    Hyperlipidemia    Membranous glomerulonephritis    Morbid obesity (HCC)    Nonischemic cardiomyopathy (HCC)    a. 01/2015 Echo: EF 20-25%, diff HK, Gr 2 DD, Triv AI, mildly dil LA and Ao root.   PONV (postoperative nausea and vomiting)    Seizures (HCC) 02/29/2020   "electralytes were out of wack."   Type II diabetes mellitus (HCC)    a. 01/2015 HbA1c = 8.9.    Medications:  I have reviewed the patient's current medications.  Medications Prior to Admission  Medication Sig Dispense Refill   acetaminophen  (TYLENOL ) 500 MG tablet Take 1,000 mg by mouth every 6 (six) hours as needed for moderate pain.     carvedilol  (COREG ) 25 MG tablet Take 2 tablets (50 mg total) by mouth 2 (two) times daily. 120 tablet 11   hydrALAZINE  (APRESOLINE ) 50 MG tablet Take 1 tablet by mouth 3 times a day 270 tablet 3   insulin  aspart (NOVOLOG  FLEXPEN) 100 UNIT/ML FlexPen Inject 15 Units into the skin 3 (three) times daily with  meals. 9 mL 0   insulin  glargine-yfgn (SEMGLEE ) 100 UNIT/ML Pen Inject 15 Units into the skin daily. (Patient taking differently: Inject 15 Units into the skin every evening.) 15 mL 1   multivitamin (RENA-VIT) TABS tablet Take 1 tablet by mouth daily.     nitroGLYCERIN  (NITROSTAT ) 0.4 MG SL tablet Place 1 tablet (0.4 mg total) under the tongue every 5 (five) minutes x 3 doses as needed within 15 minutes. If chest pain does not resolve, seek medical attention. 25 tablet 3   ondansetron  (ZOFRAN ) 4 MG tablet Take 1 tablet (4 mg total) by mouth daily as needed. 90 tablet 3   pantoprazole  (PROTONIX ) 40 MG tablet Take 1 tablet (40 mg total) by mouth daily. 30 tablet 1   sevelamer   carbonate (RENVELA ) 800 MG tablet Take 3 tablets (2,400 mg total) by mouth 3 (three) times daily with meals 810 tablet 3   Accu-Chek Softclix Lancets lancets Use as directed four times daily. 100 each 0   atorvastatin  (LIPITOR ) 80 MG tablet Take 1 tablet (80 mg total) by mouth daily. 90 tablet 3   blood glucose meter kit and supplies KIT Use to check blood sugar up to four times daily as directed. 1 each 0   carvedilol  (COREG ) 25 MG tablet Take 1 tablet by mouth twice a day with meals 180 tablet 3   glucose blood (ACCU-CHEK GUIDE TEST) test strip Use as directed four times daily. 100 strip 0   methocarbamol  (ROBAXIN ) 500 MG tablet Take 1 tablet (500 mg total) by mouth every 8 (eight) hours as needed for muscle spasms. 30 tablet 0    ALLERGIES:   Allergies  Allergen Reactions   Acyclovir And Related Hives   Imdur  [Isosorbide  Nitrate]     Headaches from nitrate    FAM HX: Family History  Problem Relation Age of Onset   Diabetes Mellitus II Mother        died @ 8.   Gastric cancer Mother    CAD Father        died @ 46.   Heart attack Father    Congestive Heart Failure Father    Diabetes Mellitus II Sister    CAD Sister        s/p PCI - age 45.    Social History:   reports that he has never smoked. He has  never used smokeless tobacco. He reports current drug use. Drug: Marijuana. He reports that he does not drink alcohol.  ROS: 12 system ROS neg except per HPI  Blood pressure 139/67, pulse 82, resp. rate 14, SpO2 98%. PHYSICAL EXAM: Gen: well appearing  Eyes: EOMI ENT: class 3 airway Neck::LIJ TDC c/d/i Extr: no edema Neuro: nonfocal Skin: exit site TDC no pain, drainage, erythema   No results found. However, due to the size of the patient record, not all encounters were searched. Please check Results Review for a complete set of results.  No results found.  Assessment/PlanJonathan Blend is an 43 y.o. male with ESRD on HD, HL, obesity, h/o seizures, DM,  HTN, HICM, h/o aortic endocarditis 2023 who presents for [facility] exchange for nonfunctioning HD catheter.   **ESRD with HD access malfunction:  TDC placed 07/2022 here for nonfunction, no recent infection.  After reviewing risk benefit alternatives patient provides consent to proceed with exchange using local and conscious sedation + angiogram.      Baron Border 01/21/2024, 1:23 PM

## 2024-01-21 NOTE — Op Note (Signed)
 Patient presents with poor flows in his left  IJ tunneled hemodialysis catheter (33 cm cuff to tip- Palindrome).  We believe it was placed here in 07/2022.  Chest x-ray shows the catheter tip is positioned deep in the RA. The catheter cuff is atthe exit site.    Summary:  1) The patient had a successful 28 cm CTT Palindrome hemodialysis catheter exchange in the left internal jugular vein. 2) Angiogram with 80% SVC restriction due to fibrin sheath noted treated with 8mm balloon angioplasty.   3) Okay to use catheter immediately.  Description of procedure: The left neck, chest and the catheter were prepped and draped in the usual sterile fashion. The exit site and adjacent tunnel tract were anesthetized with lidocaine  1% with epinephrine . The cuff was dissected free with a curved Kelly and manual traction. The catheter was withdrawn and venogram through each of the ports was performed.  Angiogram showed   A hydrophilic wire was manipulated and advanced through the arterial port and the tip was parked in the IVC. The catheter was completely removed and noted to be entirely intact. A new 28 cm cuff to tip Palindrome catheter was inserted over the guidewire and the tip parked in the right atrium and at that point the cuff was approximately 1 cm deep to the exit site.   Aspiration and flushing of both limbs of the catheter confirmed excellent flow. No kinks were visible on fluoroscopic imaging. Both limbs of the catheter were locked with heparin  and sterile caps were placed.   The hub was secured on to the chest wall with 2-0 nylon wing sutures. A 3-0 monocryl absorbable suture was used to secure the exit site with a purse string suture.   Sterile dressings were placed, and the patient returned to recovery in stable condition.  Sedation: 1 mg Versed , 50 mcg Fentanyl  Sedation time: 12 minutes  Contrast: 8 mL  Monitoring: Because of the patient's comorbid conditions and sedation during the procedure,  continuous EKG monitoring and O2 saturation monitoring was performed throughout the procedure by the RN. There were no abnormal arrhythmias encountered.  Complications: None.  Diagnoses:   T82.49XA Other complication of vascular dialysis catheter (Poor flows) N18.6 End stage renal disease  Z99.2 Dialysis dependence  Procedures Coding:  36581 Tunneled catheter exchange 37248 Angioplasty central venous restriction 77001  Fluoroscopy guidance for catheter exchange. Z6109 Contrast  Recommendations: Remove the suture in 3 weeks. 2.   Report any blood flow problems to [facility].   Discharge: The patient was discharged home in stable condition. The patient was given education regarding the care of the catheter and specific instructions in case of any problems.

## 2024-01-22 ENCOUNTER — Telehealth (HOSPITAL_COMMUNITY): Payer: Self-pay

## 2024-01-22 MED FILL — Lidocaine HCl Local Preservative Free (PF) Inj 1%: INTRAMUSCULAR | Qty: 30 | Status: AC

## 2024-01-22 NOTE — Telephone Encounter (Signed)
 Called patient to reschedule R/L Cath. Procedure scheduled for 01/28/24 at 1.30. patient told to report to main entrance of the hospital at 11.30 am and to only take 1/2 dose of insulin  the night before his procedure and to hold all insulins the morning of procedure. Patient verbalized understanding

## 2024-01-26 ENCOUNTER — Other Ambulatory Visit (HOSPITAL_COMMUNITY): Payer: Self-pay

## 2024-01-26 DIAGNOSIS — N186 End stage renal disease: Secondary | ICD-10-CM | POA: Diagnosis not present

## 2024-01-26 DIAGNOSIS — D689 Coagulation defect, unspecified: Secondary | ICD-10-CM | POA: Diagnosis not present

## 2024-01-26 DIAGNOSIS — T8249XA Other complication of vascular dialysis catheter, initial encounter: Secondary | ICD-10-CM | POA: Diagnosis not present

## 2024-01-26 DIAGNOSIS — N2581 Secondary hyperparathyroidism of renal origin: Secondary | ICD-10-CM | POA: Diagnosis not present

## 2024-01-26 DIAGNOSIS — Z992 Dependence on renal dialysis: Secondary | ICD-10-CM | POA: Diagnosis not present

## 2024-01-26 MED ORDER — METHOCARBAMOL 500 MG PO TABS
500.0000 mg | ORAL_TABLET | Freq: Every day | ORAL | 11 refills | Status: AC | PRN
  Filled 2024-01-26: qty 30, 30d supply, fill #0
  Filled 2024-07-12: qty 30, 30d supply, fill #1

## 2024-01-28 ENCOUNTER — Encounter (HOSPITAL_COMMUNITY): Admission: RE | Payer: Self-pay | Source: Home / Self Care

## 2024-01-28 ENCOUNTER — Ambulatory Visit (HOSPITAL_COMMUNITY): Admission: RE | Admit: 2024-01-28 | Source: Home / Self Care | Admitting: Cardiology

## 2024-01-28 SURGERY — RIGHT/LEFT HEART CATH AND CORONARY ANGIOGRAPHY
Anesthesia: LOCAL

## 2024-01-29 DIAGNOSIS — D689 Coagulation defect, unspecified: Secondary | ICD-10-CM | POA: Diagnosis not present

## 2024-01-29 DIAGNOSIS — N186 End stage renal disease: Secondary | ICD-10-CM | POA: Diagnosis not present

## 2024-01-29 DIAGNOSIS — Z992 Dependence on renal dialysis: Secondary | ICD-10-CM | POA: Diagnosis not present

## 2024-01-29 DIAGNOSIS — N2581 Secondary hyperparathyroidism of renal origin: Secondary | ICD-10-CM | POA: Diagnosis not present

## 2024-01-29 DIAGNOSIS — T8249XA Other complication of vascular dialysis catheter, initial encounter: Secondary | ICD-10-CM | POA: Diagnosis not present

## 2024-02-01 DIAGNOSIS — Z992 Dependence on renal dialysis: Secondary | ICD-10-CM | POA: Diagnosis not present

## 2024-02-01 DIAGNOSIS — N186 End stage renal disease: Secondary | ICD-10-CM | POA: Diagnosis not present

## 2024-02-01 DIAGNOSIS — N2581 Secondary hyperparathyroidism of renal origin: Secondary | ICD-10-CM | POA: Diagnosis not present

## 2024-02-01 DIAGNOSIS — D689 Coagulation defect, unspecified: Secondary | ICD-10-CM | POA: Diagnosis not present

## 2024-02-01 DIAGNOSIS — T8249XA Other complication of vascular dialysis catheter, initial encounter: Secondary | ICD-10-CM | POA: Diagnosis not present

## 2024-02-02 ENCOUNTER — Other Ambulatory Visit: Payer: Self-pay

## 2024-02-02 ENCOUNTER — Ambulatory Visit (HOSPITAL_COMMUNITY)
Admission: RE | Admit: 2024-02-02 | Discharge: 2024-02-02 | Disposition: A | Discharge status: Home / Self Care | Attending: Cardiology | Admitting: Cardiology

## 2024-02-02 ENCOUNTER — Encounter (HOSPITAL_COMMUNITY)
Admission: RE | Disposition: A | Discharge status: Home / Self Care | Payer: Self-pay | Source: Home / Self Care | Attending: Cardiology

## 2024-02-02 ENCOUNTER — Encounter (HOSPITAL_COMMUNITY): Payer: Self-pay | Admitting: Cardiology

## 2024-02-02 DIAGNOSIS — Z992 Dependence on renal dialysis: Secondary | ICD-10-CM | POA: Diagnosis not present

## 2024-02-02 DIAGNOSIS — I272 Pulmonary hypertension, unspecified: Secondary | ICD-10-CM | POA: Insufficient documentation

## 2024-02-02 DIAGNOSIS — E1122 Type 2 diabetes mellitus with diabetic chronic kidney disease: Secondary | ICD-10-CM | POA: Diagnosis not present

## 2024-02-02 DIAGNOSIS — I428 Other cardiomyopathies: Secondary | ICD-10-CM | POA: Diagnosis not present

## 2024-02-02 DIAGNOSIS — N186 End stage renal disease: Secondary | ICD-10-CM | POA: Diagnosis not present

## 2024-02-02 DIAGNOSIS — I429 Cardiomyopathy, unspecified: Secondary | ICD-10-CM | POA: Diagnosis not present

## 2024-02-02 DIAGNOSIS — Z794 Long term (current) use of insulin: Secondary | ICD-10-CM | POA: Diagnosis not present

## 2024-02-02 DIAGNOSIS — I351 Nonrheumatic aortic (valve) insufficiency: Secondary | ICD-10-CM | POA: Diagnosis not present

## 2024-02-02 DIAGNOSIS — I509 Heart failure, unspecified: Secondary | ICD-10-CM | POA: Insufficient documentation

## 2024-02-02 DIAGNOSIS — I132 Hypertensive heart and chronic kidney disease with heart failure and with stage 5 chronic kidney disease, or end stage renal disease: Secondary | ICD-10-CM | POA: Diagnosis not present

## 2024-02-02 HISTORY — PX: RIGHT/LEFT HEART CATH AND CORONARY ANGIOGRAPHY: CATH118266

## 2024-02-02 LAB — CBC
HCT: 48.1 % (ref 39.0–52.0)
Hemoglobin: 15.1 g/dL (ref 13.0–17.0)
MCH: 26.8 pg (ref 26.0–34.0)
MCHC: 31.4 g/dL (ref 30.0–36.0)
MCV: 85.4 fL (ref 80.0–100.0)
Platelets: 221 10*3/uL (ref 150–400)
RBC: 5.63 MIL/uL (ref 4.22–5.81)
RDW: 18.7 % — ABNORMAL HIGH (ref 11.5–15.5)
WBC: 7.5 10*3/uL (ref 4.0–10.5)
nRBC: 0 % (ref 0.0–0.2)

## 2024-02-02 LAB — BASIC METABOLIC PANEL WITH GFR
Anion gap: 15 (ref 5–15)
BUN: 50 mg/dL — ABNORMAL HIGH (ref 6–20)
CO2: 22 mmol/L (ref 22–32)
Calcium: 8.9 mg/dL (ref 8.9–10.3)
Chloride: 102 mmol/L (ref 98–111)
Creatinine, Ser: 14.84 mg/dL — ABNORMAL HIGH (ref 0.61–1.24)
GFR, Estimated: 4 mL/min — ABNORMAL LOW (ref >60)
Glucose, Bld: 148 mg/dL — ABNORMAL HIGH (ref 70–99)
Potassium: 5 mmol/L (ref 3.5–5.1)
Sodium: 139 mmol/L (ref 135–145)

## 2024-02-02 LAB — GLUCOSE, CAPILLARY: Glucose-Capillary: 161 mg/dL — ABNORMAL HIGH (ref 70–99)

## 2024-02-02 SURGERY — RIGHT/LEFT HEART CATH AND CORONARY ANGIOGRAPHY
Anesthesia: LOCAL

## 2024-02-02 MED ORDER — ACETAMINOPHEN 325 MG PO TABS: 650.0000 mg | ORAL_TABLET | ORAL | Status: DC | PRN

## 2024-02-02 MED ORDER — ONDANSETRON HCL 4 MG/2ML IJ SOLN
4.0000 mg | Freq: Four times a day (QID) | INTRAMUSCULAR | Status: DC | PRN
Start: 2024-02-02 — End: 2024-02-02

## 2024-02-02 MED ORDER — IOHEXOL 350 MG/ML SOLN
INTRAVENOUS | Status: DC | PRN
  Administered 2024-02-02: 64 mL

## 2024-02-02 MED ORDER — FENTANYL CITRATE (PF) 100 MCG/2ML IJ SOLN
INTRAMUSCULAR | Status: AC
  Filled 2024-02-02: qty 2

## 2024-02-02 MED ORDER — MIDAZOLAM HCL 2 MG/2ML IJ SOLN
INTRAMUSCULAR | Status: DC | PRN
  Administered 2024-02-02: 1 mg via INTRAVENOUS
  Administered 2024-02-02: .5 mg via INTRAVENOUS

## 2024-02-02 MED ORDER — ASPIRIN 81 MG PO CHEW
81.0000 mg | CHEWABLE_TABLET | ORAL | Status: AC
  Administered 2024-02-02: 81 mg via ORAL
  Filled 2024-02-02: qty 1

## 2024-02-02 MED ORDER — SODIUM CHLORIDE 0.9% FLUSH: 3.0000 mL | INTRAVENOUS | Status: DC | PRN

## 2024-02-02 MED ORDER — SODIUM CHLORIDE 0.9 % IV SOLN: INTRAVENOUS | Status: DC

## 2024-02-02 MED ORDER — MIDAZOLAM HCL 2 MG/2ML IJ SOLN
INTRAMUSCULAR | Status: AC
  Filled 2024-02-02: qty 2

## 2024-02-02 MED ORDER — LABETALOL HCL 5 MG/ML IV SOLN: 10.0000 mg | INTRAVENOUS | Status: DC | PRN

## 2024-02-02 MED ORDER — HEPARIN (PORCINE) IN NACL 1000-0.9 UT/500ML-% IV SOLN
INTRAVENOUS | Status: DC | PRN
  Administered 2024-02-02 (×2): 500 mL

## 2024-02-02 MED ORDER — SODIUM CHLORIDE 0.9 % IV SOLN: 250.0000 mL | INTRAVENOUS | Status: DC | PRN

## 2024-02-02 MED ORDER — HYDRALAZINE HCL 20 MG/ML IJ SOLN: 10.0000 mg | INTRAMUSCULAR | Status: DC | PRN

## 2024-02-02 MED ORDER — FENTANYL CITRATE (PF) 100 MCG/2ML IJ SOLN
INTRAMUSCULAR | Status: DC | PRN
  Administered 2024-02-02 (×2): 25 ug via INTRAVENOUS

## 2024-02-02 MED ORDER — SODIUM CHLORIDE 0.9% FLUSH: 3.0000 mL | Freq: Two times a day (BID) | INTRAVENOUS | Status: DC

## 2024-02-02 MED ORDER — LIDOCAINE HCL (PF) 1 % IJ SOLN
INTRAMUSCULAR | Status: DC | PRN
  Administered 2024-02-02 (×2): 10 mL

## 2024-02-02 SURGICAL SUPPLY — 9 items
CATH INFINITI 5FR MULTPACK ANG (CATHETERS) IMPLANT
CATH SWAN GANZ 7F STRAIGHT (CATHETERS) IMPLANT
KIT HEART LEFT (KITS) IMPLANT
KIT MICROPUNCTURE NIT STIFF (SHEATH) IMPLANT
PACK CARDIAC CATHETERIZATION (CUSTOM PROCEDURE TRAY) ×1 IMPLANT
SHEATH PINNACLE 5F 10CM (SHEATH) IMPLANT
SHEATH PINNACLE 7F 10CM (SHEATH) IMPLANT
SHEATH PROBE COVER 6X72 (BAG) IMPLANT
WIRE EMERALD 3MM-J .035X150CM (WIRE) IMPLANT

## 2024-02-02 NOTE — H&P (Signed)
 Advanced Heart Failure Team History and Physical Note   PCP:  Jose Ngo, MD  PCP-Cardiology: Peder Bourdon, MD     Reason for Admission: pre-op  cath   HPI:    43 y.o. with severe aortic insufficiency needs AVR, pre-op  R/LHC today.    Home Medications Prior to Admission medications   Medication Sig Start Date End Date Taking? Authorizing Provider  acetaminophen  (TYLENOL ) 500 MG tablet Take 1,000 mg by mouth every 6 (six) hours as needed for moderate pain.   Yes [provider]  atorvastatin  (LIPITOR ) 80 MG tablet Take 1 tablet (80 mg total) by mouth daily. 01/20/24  Yes   carvedilol  (COREG ) 25 MG tablet Take 2 tablets (50 mg total) by mouth 2 (two) times daily. 10/15/23  Yes Masters, Katie, DO  hydrALAZINE  (APRESOLINE ) 50 MG tablet Take 1 tablet by mouth 3 times a day 01/04/24  Yes   insulin  aspart (NOVOLOG  FLEXPEN) 100 UNIT/ML FlexPen Inject 15 Units into the skin 3 (three) times daily with meals. Patient taking differently: Inject 15 Units into the skin 3 (three) times daily as needed for high blood sugar. 11/05/23  Yes   insulin  glargine-yfgn (SEMGLEE ) 100 UNIT/ML Pen Inject 15 Units into the skin daily. Patient taking differently: Inject 15 Units into the skin every evening. 11/01/23  Yes Masters, Katie, DO  methocarbamol  (ROBAXIN ) 500 MG tablet Take 1 tablet (500 mg total) by mouth daily as needed. 01/26/24  Yes   multivitamin (RENA-VIT) TABS tablet Take 1 tablet by mouth daily.   Yes [provider]  ondansetron  (ZOFRAN ) 4 MG tablet Take 1 tablet (4 mg total) by mouth daily as needed. 11/04/23  Yes   pantoprazole  (PROTONIX ) 40 MG tablet Take 1 tablet (40 mg total) by mouth daily. 01/01/24 12/31/24 Yes Alexander-Savino, [place], MD  sevelamer  carbonate (RENVELA ) 800 MG tablet Take 3 tablets (2,400 mg total) by mouth 3 (three) times daily with meals 11/01/23  Yes Masters, Katie, DO  Accu-Chek Softclix Lancets lancets Use as directed four times daily.  10/15/23     blood glucose meter kit and supplies KIT Use to check blood sugar up to four times daily as directed. 10/15/23   Masters, Katie, DO  glucose blood (ACCU-CHEK GUIDE TEST) test strip Use as directed four times daily. 10/15/23     nitroGLYCERIN  (NITROSTAT ) 0.4 MG SL tablet Place 1 tablet (0.4 mg total) under the tongue every 5 (five) minutes x 3 doses as needed within 15 minutes. If chest pain does not resolve, seek medical attention. 11/10/23   Nieves Bars D, NP    Past Medical History: Past Medical History:  Diagnosis Date   Abscess of left groin    Acute blood loss anemia 11/11/2013   Anemia in chronic kidney disease (CKD)    Asthma    Boil of scrotum 11/21/2015   Chest pain    a. 01/2015 Lexiscan  MV: EF 28%, inferior, inferolateral, apical ischemia;  b. 01/2015 Cath: nl cors, PCWP 18 mmHg, CO 9.38 L/min, CI 3.53 L/min/m^2.   CHF (congestive heart failure) (HCC)    Depression    Situational   ESRD (end stage renal disease) (HCC)    TTHS- [place]   Essential hypertension    Family history of adverse reaction to anesthesia    sister- "it was too much for her heart" died   Flash pulmonary edema (HCC) 09/18/2022   GERD (gastroesophageal reflux disease)    Hyperlipidemia    Membranous glomerulonephritis    Morbid obesity (HCC)  Nonischemic cardiomyopathy (HCC)    a. 01/2015 Echo: EF 20-25%, diff HK, Gr 2 DD, Triv AI, mildly dil LA and Ao root.   PONV (postoperative nausea and vomiting)    Seizures (HCC) 02/29/2020   "electralytes were out of wack."   Type II diabetes mellitus (HCC)    a. 01/2015 HbA1c = 8.9.    Past Surgical History: Past Surgical History:  Procedure Laterality Date   A/V FISTULAGRAM N/A 08/12/2021   Procedure: A/V FISTULAGRAM;  Surgeon: Adine Hoof, MD;  Location: [facility] INVASIVE CV LAB;  Service: Cardiovascular;  Laterality: N/A;   AMPUTATION Left 10/28/2023   Procedure: AMPUTATION RAY FOOT;  Surgeon: Evertt Hoe, DPM;  Location:  [facility] OR;  Service: Orthopedics/Podiatry;  Laterality: Left;  Partial 3rd ray resection, I&D, antibiotic beads, wound vac   AV FISTULA PLACEMENT Right 12/18/2020   Procedure: RIGHT ARM RADIOCEPHALIC  ARTERIOVENOUS (AV) FISTULA CREATION;  Surgeon: Adine Hoof, MD;  Location: [facility] OR;  Service: Vascular;  Laterality: Right;   CARDIAC CATHETERIZATION N/A 02/01/2015   Procedure: Right/Left Heart Cath and Coronary Angiography;  Surgeon: Loyde Rule, MD;  Location: [facility] INVASIVE CV LAB;  Service: Cardiovascular;  Laterality: N/A;   INCISION AND DRAINAGE Left 09/05/2023   Procedure: INCISION AND DRAINAGE WITH 3RD TOE METATARSAL RESECTION LEFT FOOT AND 4TH TOE PROXIMAL PHALANX RESECTION;  Surgeon: Evertt Hoe, DPM;  Location: [facility] OR;  Service: Orthopedics/Podiatry;  Laterality: Left;  I&D with possible metatarsal resection, left foot   IR FLUORO GUIDE CV LINE LEFT  07/21/2022   IR FLUORO GUIDE CV LINE LEFT  07/21/2022   IR FLUORO GUIDE CV LINE RIGHT  10/11/2019   IR REMOVAL TUN CV CATH W/O FL  07/17/2022   IR REMOVAL TUN CV CATH W/O FL  08/18/2022   IR US  GUIDE VASC ACCESS LEFT  07/21/2022   IR US  GUIDE VASC ACCESS LEFT  07/21/2022   IR US  GUIDE VASC ACCESS RIGHT  10/11/2019   IRRIGATION AND DEBRIDEMENT FOOT Left 10/30/2023   Procedure: IRRIGATION AND DEBRIDEMENT WOUND;  Surgeon: Evertt Hoe, DPM;  Location: [facility] OR;  Service: Orthopedics/Podiatry;  Laterality: Left;   TEE WITHOUT CARDIOVERSION N/A 07/18/2022   Procedure: TRANSESOPHAGEAL ECHOCARDIOGRAM (TEE);  Surgeon: Bridgette Campus, MD;  Location: [facility] ENDOSCOPY;  Service: Cardiovascular;  Laterality: N/A;   THORACOTOMY Left 11/08/2013   Procedure: LEFT THORACOTOMY;  Surgeon: Heriberto London, MD;  Location[facility] OR;  Service: Thoracic;  Laterality: Left;   TOOTH EXTRACTION N/A 10/30/2023   Procedure: DENTAL RESTORATION/EXTRACTIONS;  Surgeon: Ascencion Lava, DMD;  Location: [facility] OR;  Service: Oral Surgery;  Laterality: N/A;    TRANSESOPHAGEAL ECHOCARDIOGRAM (CATH LAB) N/A 10/29/2023   Procedure: TRANSESOPHAGEAL ECHOCARDIOGRAM;  Surgeon: Darlis Eisenmenger, MD;  Location: [facility] INVASIVE CV LAB;  Service: Cardiovascular;  Laterality: N/A;   TUNNELLED CATHETER EXCHANGE N/A 01/21/2024   Procedure: TUNNELLED CATHETER EXCHANGE;  Surgeon: Baron Border, MD;  Location: [facility] INVASIVE CV LAB;  Service: Cardiovascular;  Laterality: N/A;    Family History:  Family History  Problem Relation Age of Onset   Diabetes Mellitus II Mother        died @ 37.   Gastric cancer Mother    CAD Father        died @ 38.   Heart attack Father    Congestive Heart Failure Father    Diabetes Mellitus II Sister    CAD Sister        s/p PCI - age 70.  Social History: Social History   Socioeconomic History   Marital status: Single    Spouse name: Not on file   Number of children: 2   Years of education: Not on file   Highest education level: Some college, no degree  Occupational History   Not on file  Tobacco Use   Smoking status: Never   Smokeless tobacco: Never  Vaping Use   Vaping status: Never Used  Substance and Sexual Activity   Alcohol use: No    Alcohol/week: 0.0 standard drinks of alcohol   Drug use: Yes    Types: Marijuana    Comment: last used 09/15/2020   Sexual activity: Yes    Partners: Female  Other Topics Concern   Not on file  Social History Narrative   Lives in [place] by himself.  Currently in [facility].   Caffeine- none       Social Drivers of Corporate investment banker Strain: Not on file  Food Insecurity: No Food Insecurity (10/27/2023)   Hunger Vital Sign    Worried About Running Out of Food in the Last Year: Never true    Ran Out of Food in the Last Year: Never true  Transportation Needs: No Transportation Needs (10/27/2023)   PRAPARE - Administrator, Civil Service (Medical): No    Lack of Transportation (Non-Medical): No  Physical Activity: Insufficiently Active (11/27/2021)    Exercise Vital Sign    Days of Exercise per Week: 2 days    Minutes of Exercise per Session: 30 min  Stress: Stress Concern Present (11/27/2021)   [employer] of Occupational Health - Occupational Stress Questionnaire    Feeling of Stress : To some extent  Social Connections: Unknown (02/17/2023)   Received from [facility]   Social Network    Social Network: Not on file    Allergies:  Allergies  Allergen Reactions   Acyclovir And Related Hives   Imdur  [Isosorbide  Nitrate]     Headaches from nitrate    Objective:    Vital Signs:   Temp:  [98.7 F (37.1 C)] 98.7 F (37.1 C) (05/27 0545) Pulse Rate:  [62] 62 (05/27 0545) Resp:  [16] 16 (05/27 0545) BP: (109)/(53) 109/53 (05/27 0545) SpO2:  [98 %] 98 % (05/27 0545) Weight:  [114.8 kg] 114.8 kg (05/27 0545)   Filed Weights   02/02/24 0545  Weight: 114.8 kg     Physical Exam     General:  Well appearing. No respiratory difficulty HEENT: Normal Neck: Supple. no JVD. Carotids 2+ bilat; no bruits. No lymphadenopathy or thyromegaly appreciated. Cor: PMI nondisplaced. Regular rate & rhythm. 2/6 diastolic murmur Lungs: Clear Abdomen: Soft, nontender, nondistended. No hepatosplenomegaly. No bruits or masses. Good bowel sounds. Extremities: No cyanosis, clubbing, rash, edema Neuro: Alert & oriented x 3, cranial nerves grossly intact. moves all 4 extremities w/o difficulty. Affect pleasant.    Labs     Basic Metabolic Panel: Recent Labs  Lab 02/02/24 0551  NA 139  K 5.0  CL 102  CO2 22  GLUCOSE 148*  BUN 50*  CREATININE 14.84*  CALCIUM  8.9    Liver Function Tests: No results for input(s): "AST", "ALT", "ALKPHOS", "BILITOT", "PROT", "ALBUMIN " in the last 168 hours. No results for input(s): "LIPASE", "AMYLASE" in the last 168 hours. No results for input(s): "AMMONIA" in the last 168 hours.  CBC: Recent Labs  Lab 02/02/24 0551  WBC 7.5  HGB 15.1  HCT 48.1  MCV 85.4  PLT 221     Cardiac Enzymes: No results for input(s): "CKTOTAL", "CKMB", "CKMBINDEX", "TROPONINI" in the last 168 hours.  BNP: BNP (last 3 results) Recent Labs    10/26/23 0335 12/29/23 0957  BNP 859.3* 401.1*    ProBNP (last 3 results) No results for input(s): "PROBNP" in the last 8760 hours.   CBG: Recent Labs  Lab 02/02/24 0543  GLUCAP 161*    Coagulation Studies: No results for input(s): "LABPROT", "INR" in the last 72 hours.  Imaging: No results found.   Assessment/Plan   Patient presents for [facility] pre-op  AVR.  Will use groin access given ESRD.    Peder Bourdon, MD 02/02/2024, 7:26 AM  Advanced Heart Failure Team Pager [phone] (M-F; 7a - 5p)  Please contact [facility] for night-coverage after hours (4p -7a ) and weekends on [email or web address]

## 2024-02-02 NOTE — Discharge Instructions (Signed)
 Femoral Site Care This sheet gives you information about how to care for yourself after your procedure. Your health care provider may also give you more specific instructions. If you have problems or questions, contact your health care provider. What can I expect after the procedure?  After the procedure, it is common to have: Bruising that usually fades within 1-2 weeks. Tenderness at the site. Follow these instructions at home: Wound care Follow instructions from your health care provider about how to take care of your insertion site. Make sure you: Wash your hands with soap and water before you change your bandage (dressing). If soap and water are not available, use hand sanitizer. Remove your dressing In 24 hours Do not take baths, swim, or use a hot tub until your health care provider approves. For 5 days You may shower 24 hours after the procedure or as told by your health care provider. Gently wash the site with plain soap and water. Pat the area dry with a clean towel. Do not rub the site. This may cause bleeding. Do not apply powder or lotion to the site. Keep the site clean and dry. Check your femoral site every day for signs of infection. Check for: Redness, swelling, or pain. Fluid or blood. Warmth. Pus or a bad smell. Activity For the first 2-3 days after your procedure, or as long as directed: Avoid climbing stairs as much as possible. Do not squat. Do not lift anything that is heavier than 10 lb (4.5 kg), or the limit that you are told, until your health care provider says that it is safe. For 5 days Rest as directed. Avoid sitting for a long time without moving. Get up to take short walks every 1-2 hours. Do not drive for 24 hours if you were given a medicine to help you relax (sedative). General instructions Take over-the-counter and prescription medicines only as told by your health care provider. Keep all follow-up visits as told by your health care provider. This  is important. Contact a health care provider if you have: A fever or chills. You have redness, swelling, or pain around your insertion site. Get help right away if: The catheter insertion area swells very fast. You pass out. You suddenly start to sweat or your skin gets clammy. The catheter insertion area is bleeding, and the bleeding does not stop when you hold steady pressure on the area. The area near or just beyond the catheter insertion site becomes pale, cool, tingly, or numb. These symptoms may represent a serious problem that is an emergency. Do not wait to see if the symptoms will go away. Get medical help right away. Call your local emergency services (911 in the [place] Do not drive yourself to the hospital. Summary After the procedure, it is common to have bruising that usually fades within 1-2 weeks. Check your femoral site every day for signs of infection. Do not lift anything that is heavier than 10 lb (4.5 kg), or the limit that you are told, until your health care provider says that it is safe. This information is not intended to replace advice given to you by your health care provider. Make sure you discuss any questions you have with your health care provider. Document Revised: 09/07/2017 Document Reviewed: 09/07/2017 Elsevier Patient Education  2020 [employer].

## 2024-02-03 ENCOUNTER — Other Ambulatory Visit (HOSPITAL_COMMUNITY): Payer: Self-pay

## 2024-02-03 ENCOUNTER — Ambulatory Visit: Admitting: Surgery

## 2024-02-03 DIAGNOSIS — N2581 Secondary hyperparathyroidism of renal origin: Secondary | ICD-10-CM | POA: Diagnosis not present

## 2024-02-03 DIAGNOSIS — D689 Coagulation defect, unspecified: Secondary | ICD-10-CM | POA: Diagnosis not present

## 2024-02-03 DIAGNOSIS — N186 End stage renal disease: Secondary | ICD-10-CM | POA: Diagnosis not present

## 2024-02-03 DIAGNOSIS — Z992 Dependence on renal dialysis: Secondary | ICD-10-CM | POA: Diagnosis not present

## 2024-02-03 DIAGNOSIS — T8249XA Other complication of vascular dialysis catheter, initial encounter: Secondary | ICD-10-CM | POA: Diagnosis not present

## 2024-02-03 LAB — POCT I-STAT EG7
Acid-Base Excess: 1 mmol/L (ref 0.0–2.0)
Bicarbonate: 26 mmol/L (ref 20.0–28.0)
Calcium, Ion: 1.09 mmol/L — ABNORMAL LOW (ref 1.15–1.40)
HCT: 44 % (ref 39.0–52.0)
Hemoglobin: 15 g/dL (ref 13.0–17.0)
O2 Saturation: 62 %
Potassium: 5.7 mmol/L — ABNORMAL HIGH (ref 3.5–5.1)
Sodium: 139 mmol/L (ref 135–145)
TCO2: 27 mmol/L (ref 22–32)
pCO2, Ven: 41.5 mmHg — ABNORMAL LOW (ref 44–60)
pH, Ven: 7.405 (ref 7.25–7.43)
pO2, Ven: 32 mmHg (ref 32–45)

## 2024-02-03 MED ORDER — PANTOPRAZOLE SODIUM 20 MG PO TBEC
20.0000 mg | DELAYED_RELEASE_TABLET | Freq: Every day | ORAL | 11 refills | Status: AC
  Filled 2024-02-03: qty 30, 30d supply, fill #0
  Filled 2024-03-10: qty 30, 30d supply, fill #1
  Filled 2024-04-21: qty 30, 30d supply, fill #2
  Filled 2024-05-24: qty 30, 30d supply, fill #3
  Filled 2024-06-23 (×2): qty 30, 30d supply, fill #4
  Filled 2024-07-21: qty 30, 30d supply, fill #5
  Filled 2024-08-23: qty 30, 30d supply, fill #6
  Filled 2024-09-17: qty 30, 30d supply, fill #7

## 2024-02-04 ENCOUNTER — Encounter (HOSPITAL_COMMUNITY): Payer: Self-pay

## 2024-02-04 ENCOUNTER — Other Ambulatory Visit (HOSPITAL_COMMUNITY): Payer: Self-pay

## 2024-02-04 LAB — POCT I-STAT EG7
Acid-Base Excess: 1 mmol/L (ref 0.0–2.0)
Bicarbonate: 25.8 mmol/L (ref 20.0–28.0)
Calcium, Ion: 1.08 mmol/L — ABNORMAL LOW (ref 1.15–1.40)
HCT: 44 % (ref 39.0–52.0)
Hemoglobin: 15 g/dL (ref 13.0–17.0)
O2 Saturation: 63 %
Potassium: 5.7 mmol/L — ABNORMAL HIGH (ref 3.5–5.1)
Sodium: 139 mmol/L (ref 135–145)
TCO2: 27 mmol/L (ref 22–32)
pCO2, Ven: 41.8 mmHg — ABNORMAL LOW (ref 44–60)
pH, Ven: 7.399 (ref 7.25–7.43)
pO2, Ven: 33 mmHg (ref 32–45)

## 2024-02-05 ENCOUNTER — Other Ambulatory Visit: Payer: Self-pay | Admitting: *Deleted

## 2024-02-05 ENCOUNTER — Other Ambulatory Visit (HOSPITAL_COMMUNITY): Payer: Self-pay

## 2024-02-05 DIAGNOSIS — Z992 Dependence on renal dialysis: Secondary | ICD-10-CM | POA: Diagnosis not present

## 2024-02-05 DIAGNOSIS — D689 Coagulation defect, unspecified: Secondary | ICD-10-CM | POA: Diagnosis not present

## 2024-02-05 DIAGNOSIS — N186 End stage renal disease: Secondary | ICD-10-CM | POA: Diagnosis not present

## 2024-02-05 DIAGNOSIS — T8249XA Other complication of vascular dialysis catheter, initial encounter: Secondary | ICD-10-CM | POA: Diagnosis not present

## 2024-02-05 DIAGNOSIS — N2581 Secondary hyperparathyroidism of renal origin: Secondary | ICD-10-CM | POA: Diagnosis not present

## 2024-02-05 MED ORDER — NOVOLOG FLEXPEN 100 UNIT/ML ~~LOC~~ SOPN
15.0000 [IU] | PEN_INJECTOR | Freq: Three times a day (TID) | SUBCUTANEOUS | 3 refills | Status: AC | PRN
  Filled 2024-02-05 – 2024-04-22 (×2): qty 12, 26d supply, fill #0
  Filled 2024-04-22 – 2024-07-26 (×3): qty 12, 26d supply, fill #1
  Filled 2024-09-17: qty 12, 26d supply, fill #2

## 2024-02-05 NOTE — Telephone Encounter (Signed)
 Copied from CRM [id number]. Topic: Clinical - Medication Question >> Feb 05, 2024  9:03 AM Shelby Dessert H wrote: Reason for CRM: Patient called and wanted to know the status if his insulin  aspart (NOVOLOG  FLEXPEN) 100 UNIT/ML FlexPen. Patient is completely out of it and I informed the patient that a rx was placed and he stated the pharmacy doe not have it. Patients callback is [phone].

## 2024-02-06 DIAGNOSIS — Z992 Dependence on renal dialysis: Secondary | ICD-10-CM | POA: Diagnosis not present

## 2024-02-06 DIAGNOSIS — N186 End stage renal disease: Secondary | ICD-10-CM | POA: Diagnosis not present

## 2024-02-06 DIAGNOSIS — E1129 Type 2 diabetes mellitus with other diabetic kidney complication: Secondary | ICD-10-CM | POA: Diagnosis not present

## 2024-02-09 ENCOUNTER — Ambulatory Visit (HOSPITAL_COMMUNITY)
Admission: RE | Admit: 2024-02-09 | Discharge: 2024-02-09 | Disposition: A | Discharge status: Home / Self Care | Source: Ambulatory Visit | Attending: Family Medicine | Admitting: Family Medicine

## 2024-02-09 ENCOUNTER — Telehealth

## 2024-02-09 ENCOUNTER — Telehealth: Admitting: Physician Assistant

## 2024-02-09 ENCOUNTER — Encounter (HOSPITAL_COMMUNITY): Payer: Self-pay

## 2024-02-09 ENCOUNTER — Other Ambulatory Visit (HOSPITAL_COMMUNITY): Payer: Self-pay

## 2024-02-09 VITALS — BP 144/80 | HR 54 | Ht 77.0 in | Wt 254.8 lb

## 2024-02-09 DIAGNOSIS — I061 Rheumatic aortic insufficiency: Secondary | ICD-10-CM | POA: Insufficient documentation

## 2024-02-09 DIAGNOSIS — Y838 Other surgical procedures as the cause of abnormal reaction of the patient, or of later complication, without mention of misadventure at the time of the procedure: Secondary | ICD-10-CM | POA: Insufficient documentation

## 2024-02-09 DIAGNOSIS — E1122 Type 2 diabetes mellitus with diabetic chronic kidney disease: Secondary | ICD-10-CM | POA: Diagnosis not present

## 2024-02-09 DIAGNOSIS — Z794 Long term (current) use of insulin: Secondary | ICD-10-CM | POA: Insufficient documentation

## 2024-02-09 DIAGNOSIS — I428 Other cardiomyopathies: Secondary | ICD-10-CM | POA: Insufficient documentation

## 2024-02-09 DIAGNOSIS — Z992 Dependence on renal dialysis: Secondary | ICD-10-CM | POA: Diagnosis not present

## 2024-02-09 DIAGNOSIS — R1030 Lower abdominal pain, unspecified: Secondary | ICD-10-CM | POA: Insufficient documentation

## 2024-02-09 DIAGNOSIS — T8172XA Complication of vein following a procedure, not elsewhere classified, initial encounter: Secondary | ICD-10-CM | POA: Diagnosis not present

## 2024-02-09 DIAGNOSIS — I120 Hypertensive chronic kidney disease with stage 5 chronic kidney disease or end stage renal disease: Secondary | ICD-10-CM | POA: Insufficient documentation

## 2024-02-09 DIAGNOSIS — L0291 Cutaneous abscess, unspecified: Secondary | ICD-10-CM | POA: Diagnosis not present

## 2024-02-09 DIAGNOSIS — R1031 Right lower quadrant pain: Secondary | ICD-10-CM | POA: Diagnosis not present

## 2024-02-09 DIAGNOSIS — N186 End stage renal disease: Secondary | ICD-10-CM | POA: Diagnosis not present

## 2024-02-09 MED ORDER — CEPHALEXIN 500 MG PO CAPS
500.0000 mg | ORAL_CAPSULE | Freq: Four times a day (QID) | ORAL | 0 refills | Status: AC
  Filled 2024-02-09: qty 20, 5d supply, fill #0

## 2024-02-09 NOTE — Patient Instructions (Addendum)
 We recommend getting CBC (blood work) and VAS GROIN US  to rule out Pseudoaneurysm.    At the [facility], you and your health needs are our priority. We have a designated team specialized in the treatment of Heart Failure. This Care Team includes your primary Heart Failure Specialized Cardiologist (physician), Advanced Practice Providers (APPs- Physician Assistants and Nurse Practitioners), and Pharmacist who all work together to provide you with the care you need, when you need it.   You may see any of the following providers on your designated Care Team at your next follow up:  Dr. Jules Oar Dr. Peder Bourdon Dr. Alwin Baars Dr. Judyth Nunnery Nieves Bars, NP Ruddy Corral, [place] [facility] [place], [place] Dennise Fitz, NP [place] Lee, NP Luster Salters, PharmD   Please be sure to bring in all your medications bottles to every appointment.   Need to Contact Us :  If you have any questions or concerns before your next appointment please send us  a message through [place] or call our office at [phone].    TO LEAVE A MESSAGE FOR THE NURSE SELECT OPTION 2, PLEASE LEAVE A MESSAGE INCLUDING: YOUR NAME DATE OF BIRTH CALL BACK NUMBER REASON FOR CALL**this is important as we prioritize the call backs  YOU WILL RECEIVE A CALL BACK THE SAME DAY AS LONG AS YOU CALL BEFORE 4:00 PM

## 2024-02-09 NOTE — Progress Notes (Signed)
 Virtual Visit Consent   Jeremy [name], you are scheduled for a virtual visit with a [facility] provider today. Just as with appointments in the office, your consent must be obtained to participate. Your consent will be active for this visit and any virtual visit you may have with one of our providers in the next 365 days. If you have a MyChart account, a copy of this consent can be sent to you electronically.  As this is a virtual visit, video technology does not allow for your provider to perform a traditional examination. This may limit your provider's ability to fully assess your condition. If your provider identifies any concerns that need to be evaluated in person or the need to arrange testing (such as labs, EKG, etc.), we will make arrangements to do so. Although advances in technology are sophisticated, we cannot ensure that it will always work on either your end or our end. If the connection with a video visit is poor, the visit may have to be switched to a telephone visit. With either a video or telephone visit, we are not always able to ensure that we have a secure connection.  By engaging in this virtual visit, you consent to the provision of healthcare and authorize for your insurance to be billed (if applicable) for the services provided during this visit. Depending on your insurance coverage, you may receive a charge related to this service.  I need to obtain your verbal consent now. Are you willing to proceed with your visit today? Federico Maiorino has provided verbal consent on 02/09/2024 for a virtual visit (video or telephone). Angelia Kelp, PA-C  Date: 02/09/2024 12:57 PM   Virtual Visit via Video Note   I, Angelia Kelp, connected with  Jeremy [name]  ([id number], 03-Aug-1981) on 02/09/24 at 12:45 PM EDT by a video-enabled telemedicine application and verified that I am speaking with the correct person using two identifiers.  Location: Patient: Virtual Visit  Location Patient: Home Provider: Virtual Visit Location Provider: Home Office   I discussed the limitations of evaluation and management by telemedicine and the availability of in person appointments. The patient expressed understanding and agreed to proceed.    History of Present Illness: Jeremy [name] is a 43 y.o. who identifies as a male who was assigned male at birth, and is being seen today for skin abscess.  Reports it is in the right groin area. He has recently had a cardiac cath in the right groin on 02/02/24. He did follow up with the heart failure clinic today for the same issue and they had advised to get an ultrasound to rule out a pseudoaneurysm and a CBC to check for leukocytosis or decreased hemoglobin. Patient declined and stated he would go to the ER, but instead scheduled a video visit with the Virtual Urgent Care team. He tells me they told him that it was not associated with the catheter insertion site. He reports a history of boils, and feels this is similar. Reports a boil in his right groin a few months ago that presented the exact same way and was treated successfully. He mentions the boil did drain a purulent discharge this morning. He denies fevers, chills, nausea, and vomiting. He is requesting antibiotic treatment.    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) 10/15/2023   Shortness of breath 10/15/2023   Lone post-op atrial fibrillation (HCC), resolved 09/06/2023   Osteomyelitis of fourth toe of  left foot (HCC) 09/05/2023   Diabetic foot infection (HCC) 09/04/2023   Type 2 diabetes mellitus with chronic kidney disease on chronic dialysis, with long-term current use of insulin  (HCC) 09/04/2023   Abscess of left foot 09/04/2023   Pyogenic inflammation of bone (HCC) 09/04/2023   Caries 07/23/2022   Periodontal disease 07/23/2022   Loose, teeth 07/23/2022   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   Sepsis without acute organ dysfunction (HCC) 07/22/2022   HF with recovered EF 07/21/2022   Bacteremia due to Enterococcus 07/21/2022   ESRD on dialysis (HCC) 07/20/2022   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   Aortic valve insufficiency 07/18/2022   Catheter-related bloodstream infection 07/16/2022   Anemia, unspecified 10/26/2019   Coagulation defect, unspecified (HCC) 10/21/2019   Secondary hyperparathyroidism of renal origin (HCC) 10/21/2019   Mass of soft tissue of face 09/23/2018   Onychomycosis due to dermatophyte 03/29/2018   Anemia, iron  deficiency 07/18/2016   Anemia in chronic kidney disease 07/18/2016   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, stage 5 (HCC) 05/29/2016   Scrotal abscess 11/21/2015   Suspected sleep apnea 05/24/2015   Chronic combined systolic and diastolic CHF (congestive heart failure) (HCC) 03/01/2015   Nonischemic cardiomyopathy (HCC)    Asthma    Hyperlipidemia 03/31/2012   Obesity, Class I, BMI 30-34.9 03/21/2011   Primary hypertension 03/21/2011   Gastroesophageal reflux disease 03/21/2011   Type 2 diabetes mellitus with diabetic nephropathy (HCC) 02/20/2011    Allergies:  Allergies  Allergen Reactions   Acyclovir And Related Hives   Imdur  [Isosorbide  Nitrate]     Headaches from nitrate   Medications:  Current Outpatient Medications:    cephALEXin  (KEFLEX ) 500 MG capsule, Take 1 capsule (500 mg total) by mouth 4 (four) times daily for 5 days., Disp: 20 capsule, Rfl: 0   Accu-Chek Softclix Lancets lancets, Use  as directed four times daily., Disp: 100 each, Rfl: 0   acetaminophen  (TYLENOL ) 500 MG tablet, Take 1,000 mg by mouth every 6 (six) hours as needed for moderate pain., Disp: , Rfl:    atorvastatin  (LIPITOR ) 80 MG tablet, Take 1 tablet (80 mg total) by mouth daily., Disp: 90 tablet, Rfl: 3   blood glucose meter kit and supplies KIT, Use to check blood sugar up to four times daily as directed., Disp: 1 each, Rfl: 0   carvedilol  (COREG ) 25 MG tablet, Take 2 tablets (50 mg total) by mouth 2 (two) times daily., Disp: 120 tablet, Rfl: 11   glucose blood (ACCU-CHEK GUIDE TEST) test strip, Use as directed four times daily., Disp: 100 strip, Rfl: 0   hydrALAZINE  (APRESOLINE ) 50 MG tablet, Take 1 tablet by mouth 3 times a day, Disp: 270 tablet, Rfl: 3   insulin  aspart (NOVOLOG  FLEXPEN) 100 UNIT/ML FlexPen, Inject 15 Units into the skin 3 (three) times daily as needed for high blood sugar., Disp: 12 mL, Rfl: 3   insulin  glargine-yfgn (SEMGLEE ) 100 UNIT/ML Pen, Inject 15 Units into the skin daily. (Patient taking differently: Inject 15 Units into the skin every evening.), Disp: 15 mL, Rfl:  1   methocarbamol  (ROBAXIN ) 500 MG tablet, Take 1 tablet (500 mg total) by mouth daily as needed., Disp: 30 tablet, Rfl: 11   multivitamin (RENA-VIT) TABS tablet, Take 1 tablet by mouth daily., Disp: , Rfl:    nitroGLYCERIN  (NITROSTAT ) 0.4 MG SL tablet, Place 1 tablet (0.4 mg total) under the tongue every 5 (five) minutes x 3 doses as needed within 15 minutes. If chest pain does not resolve, seek medical attention., Disp: 25 tablet, Rfl: 3   ondansetron  (ZOFRAN ) 4 MG tablet, Take 1 tablet (4 mg total) by mouth daily as needed., Disp: 90 tablet, Rfl: 3   pantoprazole  (PROTONIX ) 20 MG tablet, Take 1 tablet (20 mg total) by mouth daily as directed for reflux., Disp: 30 tablet, Rfl: 11   pantoprazole  (PROTONIX ) 40 MG tablet, Take 1 tablet (40 mg total) by mouth daily., Disp: 30 tablet, Rfl: 1   sevelamer  carbonate (RENVELA ) 800 MG  tablet, Take 3 tablets (2,400 mg total) by mouth 3 (three) times daily with meals, Disp: 810 tablet, Rfl: 3  Observations/Objective: Patient is well-developed, well-nourished in no acute distress.  Resting comfortably at home.  Head is normocephalic, atraumatic.  No labored breathing.  Speech is clear and coherent with logical content.  Patient is alert and oriented at baseline.    Assessment and Plan: 1. Abscess (Primary) - cephALEXin  (KEFLEX ) 500 MG capsule; Take 1 capsule (500 mg total) by mouth 4 (four) times daily for 5 days.  Dispense: 20 capsule; Refill: 0  - Will give Keflex  as above - Warm compresses - Keep area clean and dry - He has a scheduled procedure tomorrow with cardiology so the area can be re-evaluated in person tomorrow, and I have advised for him to have them look at it as well.  - Follow up in person if not improving or if worsening  Follow Up Instructions: I discussed the assessment and treatment plan with the patient. The patient was provided an opportunity to ask questions and all were answered. The patient agreed with the plan and demonstrated an understanding of the instructions.  A copy of instructions were sent to the patient via MyChart unless otherwise noted below.    The patient was advised to call back or seek an in-person evaluation if the symptoms worsen or if the condition fails to improve as anticipated.    Angelia Kelp, PA-C

## 2024-02-09 NOTE — Progress Notes (Addendum)
 When attempting to get patient information and AVS with instructions-but patient reports he's going to ER. Does not want ordered blood work or VAS US  Pseudoaneurysm at this time.   Orders replaced in case patient changes his mind and would like to go for study.

## 2024-02-09 NOTE — Progress Notes (Signed)
 HF MD: Dr Mitzie Anda ID: Dr Levern Reader Vascular: Dr Vikki Graves   HPI: Jaceon Heiberger is a 43 y.o. male with history of NICM, HTN, DMII, AI, ESRD.  History of aortic valve endocarditis in 2023 treated with IV antibiotics.   Cardiac cath 2016 showed no significant CAD.  Echo in 5/16 showed EF 20-25%.  By 4/17, echo showed EF up to 50%.   Admitted 11/23 with E faecalis bacteremia, source felt to be right IJ dialysis catheter. Echo showed EF 50-55%, grade II DD, RV norma, moderate AI with ? vegetation. TEE showed normal LV function, borderline severe AI, concern for AoV leaflet perforation from infective endocarditis. ID consulted, he was started on IV ampicillin /ceftriaxone  and his dialysis catheter was removed. CVTS consulted and recommended AVR. Planned to be cleared by dental surgery before considering AVR. Dental surgeon saw him and made recommendation to remove at least 8 teeth. CTA showed no evidence of CAD, coronary calcium  score of 0. After his line holiday, tunneled catheter placed for dialysis. He was then discharged home, with plans to follow up with TCTS after teeth extraction. However, he was lost to followup and did not come back to cardiology clinic or TCTS clinic.    Re-admitted 1/24 with acute dyspnea after missing HD. Placed on BiPap and IV nitro gtt. Had urgent dialysis with resolution of symptoms.  Admitted 10/26/23 with hypoxic respiratory failure, flash pulmonary edema. Echo showed EF 35-40%, severe AI. TEE showed EF 35%, severe aortic regurgitation with mobile vegetation that appeared calcified and old. ID consulted.   Multiple teeth extracted. Also treated for osteomyelitis of the 3rd metatarsal left foot. He has now completed antibiotics for osteomyelitis.   R/LHC showed normal cors, elevated PCWP but normal PA pressures, mild pulmonary hypertension, low CI 1.9 (Fick), 2.1 (TD), suspect due to valvular disease.   Today he presents for an acute visit with complaints of right testicular  swelling, started after his cath.  No fever, bleeding or drainage. Just bothersome. Says he has had boils in groin area in the past, treated with I&D and given doxycycline . Asking for antibiotic today.  ECG (personally reviewed): none ordered today.  Labs (2/25): LDL 83 Labs (4/25): hgb 14  PMH: 1. Type II diabetes since 2006.  2. HTN since around 2010.   3. Asthma 4. Hyperlipidemia 5. Morbid obesity 6. Cardiomyopathy: Nonischemic.  Echo (5/16) with EF 20-25%, diffuse hypokinesis, grade II diastolic dysfunction.  Lexiscan  Cardiolite  (5/16) with EF 28%, inferior/inferolateral/apical ischemia.  RHC/LHC (5/16) with normal coronaries, mean RA 11, PA 33/21 mean 28, mean PCWP 18, CI 2.53.  - Echo (4/17): EF 50%, moderate LVH, normal RV size and systolic function. Grade IDD  - Echo (9/17): EF 55%.  - Echo (5/19): EF 50-55%, moderate LVH, normal RV size and systolic function.  - Echo (10/20): EF 55-60%, normal RV.  - Echo (11/23): EF 50-55%, grade II DD, normal RV, moderate AI w/ ? Vegetation - TEE (2/25): EF 35%, global hypokinesis, mild RV dysfunction, severe AI, aortic valve with mobile calcified vegetations on aortic valve.  - R/LHC 5/25: normal cors, RA 6, PA 46/21 (30), PCWP 20, CO/CI (Fick) 4.7/1.9, PVR 2.12 WU, PAPi 4.1 7. ABIs normal 8/16.  8. ESRD: AKI in 9/17 with diagnosis of nephrotic syndrome. 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  9. COVID-19 infection 2/21.  10. Aortic valve endocarditis: TEE (11/23) showed EF 55-60%, normal RV, no LAA, + SVC thrombus, borderline severe AI with concern for AoV leaflet perforation 2/2 infective endocarditis.  -  TEE (2/25) with EF 35%, global hypokinesis, mild RV dysfunction, severe AI, aortic valve with mobile calcified vegetations on aortic valve.  11. 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   ROS: All systems negative except as listed in HPI, PMH and Problem List.  SH:  Social  History   Socioeconomic History   Marital status: Single    Spouse name: Not on file   Number of children: 2   Years of education: Not on file   Highest education level: Some college, no degree  Occupational History   Not on file  Tobacco Use   Smoking status: Never   Smokeless tobacco: Never  Vaping Use   Vaping status: Never Used  Substance and Sexual Activity   Alcohol use: No    Alcohol/week: 0.0 standard drinks of alcohol   Drug use: Yes    Types: Marijuana    Comment: last used 09/15/2020   Sexual activity: Yes    Partners: Female  Other Topics Concern   Not on file  Social History Narrative   Lives in [place] by himself.  Currently in [facility].   Caffeine- none       Social Drivers of Corporate investment banker Strain: Not on file  Food Insecurity: No Food Insecurity (10/27/2023)   Hunger Vital Sign    Worried About Running Out of Food in the Last Year: Never true    Ran Out of Food in the Last Year: Never true  Transportation Needs: No Transportation Needs (10/27/2023)   PRAPARE - Administrator, Civil Service (Medical): No    Lack of Transportation (Non-Medical): No  Physical Activity: Insufficiently Active (11/27/2021)   Exercise Vital Sign    Days of Exercise per Week: 2 days    Minutes of Exercise per Session: 30 min  Stress: Stress Concern Present (11/27/2021)   [employer] of Occupational Health - Occupational Stress Questionnaire    Feeling of Stress : To some extent  Social Connections: Unknown (02/17/2023)   Received from [facility]   Social Network    Social Network: Not on file  Intimate Partner Violence: Not At Risk (10/27/2023)   Humiliation, Afraid, Rape, and Kick questionnaire    Fear of Current or Ex-Partner: No    Emotionally Abused: No    Physically Abused: No    Sexually Abused: No    FH:  Family History  Problem Relation Age of Onset   Diabetes Mellitus II Mother        died @ 65.   Gastric  cancer Mother    CAD Father        died @ 85.   Heart attack Father    Congestive Heart Failure Father    Diabetes Mellitus II Sister    CAD Sister        s/p PCI - age 66.   Current Outpatient Medications  Medication Sig Dispense Refill   Accu-Chek Softclix Lancets lancets Use as directed four times daily. 100 each 0   acetaminophen  (TYLENOL ) 500 MG tablet Take 1,000 mg by mouth every 6 (six) hours as needed for moderate pain.     atorvastatin  (LIPITOR ) 80 MG tablet Take 1 tablet (80 mg total) by mouth daily. 90 tablet 3   blood glucose meter kit and supplies KIT Use to check blood sugar up to four times daily as directed. 1 each 0   carvedilol  (COREG ) 25 MG tablet Take 2 tablets (50 mg total) by  mouth 2 (two) times daily. 120 tablet 11   glucose blood (ACCU-CHEK GUIDE TEST) test strip Use as directed four times daily. 100 strip 0   hydrALAZINE  (APRESOLINE ) 50 MG tablet Take 1 tablet by mouth 3 times a day 270 tablet 3   insulin  aspart (NOVOLOG  FLEXPEN) 100 UNIT/ML FlexPen Inject 15 Units into the skin 3 (three) times daily as needed for high blood sugar. 12 mL 3   insulin  glargine-yfgn (SEMGLEE ) 100 UNIT/ML Pen Inject 15 Units into the skin daily. (Patient taking differently: Inject 15 Units into the skin every evening.) 15 mL 1   methocarbamol  (ROBAXIN ) 500 MG tablet Take 1 tablet (500 mg total) by mouth daily as needed. 30 tablet 11   multivitamin (RENA-VIT) TABS tablet Take 1 tablet by mouth daily.     nitroGLYCERIN  (NITROSTAT ) 0.4 MG SL tablet Place 1 tablet (0.4 mg total) under the tongue every 5 (five) minutes x 3 doses as needed within 15 minutes. If chest pain does not resolve, seek medical attention. 25 tablet 3   ondansetron  (ZOFRAN ) 4 MG tablet Take 1 tablet (4 mg total) by mouth daily as needed. 90 tablet 3   pantoprazole  (PROTONIX ) 20 MG tablet Take 1 tablet (20 mg total) by mouth daily as directed for reflux. 30 tablet 11   pantoprazole  (PROTONIX ) 40 MG tablet Take 1 tablet  (40 mg total) by mouth daily. 30 tablet 1   sevelamer  carbonate (RENVELA ) 800 MG tablet Take 3 tablets (2,400 mg total) by mouth 3 (three) times daily with meals 810 tablet 3   No current facility-administered medications for this encounter.   Wt Readings from Last 3 Encounters:  02/09/24 115.6 kg (254 lb 12.8 oz)  02/02/24 114.8 kg (253 lb)  12/29/23 114 kg (251 lb 6.4 oz)   BP (!) 144/80   Pulse (!) 54   Ht 6\' 5"  (1.956 m)   Wt 115.6 kg (254 lb 12.8 oz)   SpO2 98%   BMI 30.21 kg/m   PHYSICAL EXAM: General:  NAD. No resp difficulty, walked into clinic HEENT: Normal Neck: Supple. No JVD. Cor: Regular rate & rhythm. No rubs, gallops or murmurs. Lungs: Clear Abdomen: Soft, nontender, nondistended.  Extremities: No cyanosis, clubbing, rash, edema Neuro: Alert & oriented x 3, moves all 4 extremities w/o difficulty. Affect pleasant. GU: unilateral swelling to right testicle, no ecchymosis or drainage.   ASSESSMENT & PLAN: 1. Right groin swelling: concern for pseudoaneurysm post cath. No palpable bruit or thrill. Cath site soft, pedal pulses palpable, no ecchymosis to R flank or groin. Suspect he had a small hematoma from cath that migrated to right testicle. Will likely take weeks to resolve. - Out of abundance of caution, will arrange groin u/s to rule out pseudoaneurysm. Will get CBC to ensure hgb stable. - Patient upset he is not receiving abx therapy today, and now adamant he is leaving and going to the ED. I asked patient to reconsider, but I do not believe he needs abx at this time.  Vernia Good, FNP-BC 02/09/24

## 2024-02-09 NOTE — Patient Instructions (Signed)
 Cass Clerk, thank you for joining Angelia Kelp, PA-C for today's virtual visit.  While this provider is not your primary care provider (PCP), if your PCP is located in our provider database this encounter information will be shared with them immediately following your visit.   A [facility] MyChart account gives you access to today's visit and all your visits, tests, and labs performed at [facility] " click here if you don't have a [facility] MyChart account or go to [email or web address]  Consent: (Patient) Jeremy [name] provided verbal consent for this virtual visit at the beginning of the encounter.  Current Medications:  Current Outpatient Medications:    cephALEXin  (KEFLEX ) 500 MG capsule, Take 1 capsule (500 mg total) by mouth 4 (four) times daily for 5 days., Disp: 20 capsule, Rfl: 0   Accu-Chek Softclix Lancets lancets, Use as directed four times daily., Disp: 100 each, Rfl: 0   acetaminophen  (TYLENOL ) 500 MG tablet, Take 1,000 mg by mouth every 6 (six) hours as needed for moderate pain., Disp: , Rfl:    atorvastatin  (LIPITOR ) 80 MG tablet, Take 1 tablet (80 mg total) by mouth daily., Disp: 90 tablet, Rfl: 3   blood glucose meter kit and supplies KIT, Use to check blood sugar up to four times daily as directed., Disp: 1 each, Rfl: 0   carvedilol  (COREG ) 25 MG tablet, Take 2 tablets (50 mg total) by mouth 2 (two) times daily., Disp: 120 tablet, Rfl: 11   glucose blood (ACCU-CHEK GUIDE TEST) test strip, Use as directed four times daily., Disp: 100 strip, Rfl: 0   hydrALAZINE  (APRESOLINE ) 50 MG tablet, Take 1 tablet by mouth 3 times a day, Disp: 270 tablet, Rfl: 3   insulin  aspart (NOVOLOG  FLEXPEN) 100 UNIT/ML FlexPen, Inject 15 Units into the skin 3 (three) times daily as needed for high blood sugar., Disp: 12 mL, Rfl: 3   insulin  glargine-yfgn (SEMGLEE ) 100 UNIT/ML Pen, Inject 15 Units into the skin daily. (Patient taking differently: Inject 15 Units into  the skin every evening.), Disp: 15 mL, Rfl: 1   methocarbamol  (ROBAXIN ) 500 MG tablet, Take 1 tablet (500 mg total) by mouth daily as needed., Disp: 30 tablet, Rfl: 11   multivitamin (RENA-VIT) TABS tablet, Take 1 tablet by mouth daily., Disp: , Rfl:    nitroGLYCERIN  (NITROSTAT ) 0.4 MG SL tablet, Place 1 tablet (0.4 mg total) under the tongue every 5 (five) minutes x 3 doses as needed within 15 minutes. If chest pain does not resolve, seek medical attention., Disp: 25 tablet, Rfl: 3   ondansetron  (ZOFRAN ) 4 MG tablet, Take 1 tablet (4 mg total) by mouth daily as needed., Disp: 90 tablet, Rfl: 3   pantoprazole  (PROTONIX ) 20 MG tablet, Take 1 tablet (20 mg total) by mouth daily as directed for reflux., Disp: 30 tablet, Rfl: 11   pantoprazole  (PROTONIX ) 40 MG tablet, Take 1 tablet (40 mg total) by mouth daily., Disp: 30 tablet, Rfl: 1   sevelamer  carbonate (RENVELA ) 800 MG tablet, Take 3 tablets (2,400 mg total) by mouth 3 (three) times daily with meals, Disp: 810 tablet, Rfl: 3   Medications ordered in this encounter:  Meds ordered this encounter  Medications   cephALEXin  (KEFLEX ) 500 MG capsule    Sig: Take 1 capsule (500 mg total) by mouth 4 (four) times daily for 5 days.    Dispense:  20 capsule    Refill:  0    Supervising Provider:   Corine Dice [[id number]]     *  If you need refills on other medications prior to your next appointment, please contact your pharmacy*  Follow-Up: Call back or seek an in-person evaluation if the symptoms worsen or if the condition fails to improve as anticipated.  [facility] [phone]  Other Instructions Skin Abscess  A skin abscess is an infected area on or under your skin. It contains pus and other material. An abscess may also be called a furuncle, carbuncle, or boil. It is often the result of an infection caused by bacteria. An abscess can occur in or on almost any part of your body. Sometimes, an abscess may break open (rupture)  on its own. In most cases, it will keep getting worse unless it is treated. An abscess can cause pain and make you feel ill. An untreated abscess can cause infection to spread to other parts of your body or your bloodstream. The abscess may need to be drained. You may also need to take antibiotics. What are the causes? An abscess occurs when germs, like bacteria, pass through your skin and cause an infection. This may be caused by: A scrape or cut on your skin. A puncture wound through your skin, such as a needle injection or insect bite. Blocked oil or sweat glands. Blocked and infected hair follicles. A fluid-filled sac that forms beneath your skin (sebaceous cyst) and becomes infected. What increases the risk? You may be more likely to develop an abscess if: You have problems with blood circulation, or you have a weak body defense system (immune system). You have diabetes. You have dry and irritated skin. You get injections often or use IV drugs. You have a foreign body in a wound, such as a splinter. You smoke or use tobacco products. What are the signs or symptoms? Symptoms of this condition include: A painful, firm bump under the skin. A bump with pus at the top. This may break through the skin and drain. Other symptoms include: Redness and swelling around the abscess. Warmth or tenderness. Swelling of the lymph nodes (glands) near the abscess. A sore on the skin. How is this diagnosed? This condition may be diagnosed based on a physical exam and your medical history. You may also have tests done, such as: A test of a sample of pus. This may be done to find what is causing the infection. Blood tests. Imaging tests, such as an ultrasound, CT scan, or MRI. How is this treated? A small abscess that drains on its own may not need to be treated. Treatment for larger abscesses may include: Moist heat or a heat pack applied to the area a few times a day. Incision and drainage. This is  a procedure to drain the abscess. Antibiotics. For a severe abscess, you may first get antibiotics through an IV and then change to antibiotics by mouth. Follow these instructions at home: Medicines Take over-the-counter and prescription medicines only as told by your provider. If you were prescribed antibiotics, take them as told by your provider. Do not stop using the antibiotic even if you start to feel better. Abscess care  If you have an abscess that has not drained, apply heat to the affected area. Use the heat source that your provider recommends, such as a moist heat pack or a heating pad. Place a towel between your skin and the heat source. Leave the heat on for 20-30 minutes at a time. If your skin turns bright red, remove the heat right away to prevent burns. The  risk of burns is higher if you cannot feel pain, heat, or cold. Follow instructions from your provider about how to take care of your abscess. Make sure you: Cover the abscess with a bandage (dressing). Wash your hands with soap and water for at least 20 seconds before and after you change the dressing or gauze. If soap and water are not available, use hand sanitizer. Change your dressing or gauze as told by your provider. Check your abscess every day for signs of an infection that is getting worse. Check for: More redness, swelling, pain, or tenderness. More fluid or blood. Warmth. More pus or a worse smell. General instructions To avoid spreading the infection: Do not share personal care items, towels, or hot tubs with others. Avoid making skin contact with other people. Be careful when getting rid of used dressings, wound packing, or any drainage from the abscess. Do not use any products that contain nicotine or tobacco. These products include cigarettes, chewing tobacco, and vaping devices, such as e-cigarettes. If you need help quitting, ask your provider. Do not use any creams, ointments, or liquids unless you  have been told to by your provider. Contact a health care provider if: You see redness that spreads quickly or red streaks on your skin spreading away from the abscess. You have any signs of worse infection at the abscess. You vomit every time you eat or drink. You have a fever, chills, or muscle aches. The cyst or abscess returns. Get help right away if: You have severe pain. You make less pee (urine) than normal. This information is not intended to replace advice given to you by your health care provider. Make sure you discuss any questions you have with your health care provider. Document Revised: 04/09/2022 Document Reviewed: 04/09/2022 Elsevier Patient Education  2024 Elsevier Inc.   If you have been instructed to have an in-person evaluation today at a local Urgent Care facility, please use the link below. It will take you to a list of all of our available [facility], including address, phone number and hours of operation. Please do not delay care.  [facility]  If you or a family member do not have a primary care provider, use the link below to schedule a visit and establish care. When you choose a [facility] primary care physician or advanced practice provider, you gain a long-term partner in health. Find a Primary Care Provider  Learn more about [facility]'s in-office and virtual care options: [facility] - Get Care Now

## 2024-02-10 ENCOUNTER — Encounter (HOSPITAL_COMMUNITY): Payer: Self-pay | Admitting: Vascular Surgery

## 2024-02-10 ENCOUNTER — Ambulatory Visit (HOSPITAL_COMMUNITY)
Admission: RE | Admit: 2024-02-10 | Discharge: 2024-02-10 | Disposition: A | Discharge status: Home / Self Care | Attending: Vascular Surgery | Admitting: Vascular Surgery

## 2024-02-10 ENCOUNTER — Encounter (HOSPITAL_COMMUNITY)
Admission: RE | Disposition: A | Discharge status: Home / Self Care | Payer: Self-pay | Source: Home / Self Care | Attending: Vascular Surgery

## 2024-02-10 ENCOUNTER — Other Ambulatory Visit: Payer: Self-pay

## 2024-02-10 DIAGNOSIS — K219 Gastro-esophageal reflux disease without esophagitis: Secondary | ICD-10-CM | POA: Diagnosis not present

## 2024-02-10 DIAGNOSIS — E1122 Type 2 diabetes mellitus with diabetic chronic kidney disease: Secondary | ICD-10-CM | POA: Insufficient documentation

## 2024-02-10 DIAGNOSIS — J45909 Unspecified asthma, uncomplicated: Secondary | ICD-10-CM | POA: Insufficient documentation

## 2024-02-10 DIAGNOSIS — N186 End stage renal disease: Secondary | ICD-10-CM | POA: Diagnosis not present

## 2024-02-10 DIAGNOSIS — I132 Hypertensive heart and chronic kidney disease with heart failure and with stage 5 chronic kidney disease, or end stage renal disease: Secondary | ICD-10-CM | POA: Insufficient documentation

## 2024-02-10 DIAGNOSIS — Z452 Encounter for adjustment and management of vascular access device: Secondary | ICD-10-CM | POA: Diagnosis not present

## 2024-02-10 DIAGNOSIS — T82898A Other specified complication of vascular prosthetic devices, implants and grafts, initial encounter: Secondary | ICD-10-CM

## 2024-02-10 DIAGNOSIS — Z794 Long term (current) use of insulin: Secondary | ICD-10-CM | POA: Diagnosis not present

## 2024-02-10 DIAGNOSIS — I509 Heart failure, unspecified: Secondary | ICD-10-CM | POA: Insufficient documentation

## 2024-02-10 DIAGNOSIS — Z992 Dependence on renal dialysis: Secondary | ICD-10-CM | POA: Diagnosis not present

## 2024-02-10 DIAGNOSIS — D631 Anemia in chronic kidney disease: Secondary | ICD-10-CM | POA: Insufficient documentation

## 2024-02-10 DIAGNOSIS — F1290 Cannabis use, unspecified, uncomplicated: Secondary | ICD-10-CM | POA: Insufficient documentation

## 2024-02-10 HISTORY — PX: TUNNELLED CATHETER EXCHANGE: CATH118373

## 2024-02-10 LAB — GLUCOSE, CAPILLARY: Glucose-Capillary: 101 mg/dL — ABNORMAL HIGH (ref 70–99)

## 2024-02-10 SURGERY — TUNNELLED CATHETER EXCHANGE
Anesthesia: LOCAL

## 2024-02-10 MED ORDER — LIDOCAINE HCL (PF) 1 % IJ SOLN
INTRAMUSCULAR | Status: DC | PRN
  Administered 2024-02-10: 15 mL

## 2024-02-10 MED ORDER — HEPARIN SODIUM (PORCINE) 1000 UNIT/ML IJ SOLN
INTRAMUSCULAR | Status: AC
  Filled 2024-02-10: qty 10

## 2024-02-10 MED ORDER — HEPARIN (PORCINE) IN NACL 1000-0.9 UT/500ML-% IV SOLN
INTRAVENOUS | Status: DC | PRN
  Administered 2024-02-10: 500 mL

## 2024-02-10 MED ORDER — HEPARIN SODIUM (PORCINE) 1000 UNIT/ML IJ SOLN
INTRAMUSCULAR | Status: DC | PRN
  Administered 2024-02-10: 4200 [IU] via INTRAVENOUS

## 2024-02-10 MED ORDER — LIDOCAINE HCL (PF) 1 % IJ SOLN
INTRAMUSCULAR | Status: AC
  Filled 2024-02-10: qty 30

## 2024-02-10 SURGICAL SUPPLY — 3 items
CATH PALINDROME-P 28CM W/VT (CATHETERS) IMPLANT
GLIDEWIRE STIFF .35X180X3 HYDR (WIRE) IMPLANT
TRAY PV CATH (CUSTOM PROCEDURE TRAY) ×1 IMPLANT

## 2024-02-10 NOTE — H&P (Signed)
 H&P    MRN #:  [id number]  History of Present Illness: This is a 43 y.o. male with ESRD that presents for dialysis catheter exchange.  Patient states his catheter was exchanged about 3 weeks ago and has not worked for the last week.  Currently using left IJ TDC.  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 01/2015 Lexiscan  MV: EF 28%, inferior, inferolateral, apical ischemia;  b. 01/2015 Cath: nl cors, PCWP 18 mmHg, CO 9.38 L/min, CI 3.53 L/min/m^2.   CHF (congestive heart failure) (HCC)    Depression    Situational   ESRD (end stage renal disease) (HCC)    TTHS- [place]   Essential hypertension    Family history of adverse reaction to anesthesia    sister- "it was too much for her heart" died   Flash pulmonary edema (HCC) 09/18/2022   GERD (gastroesophageal reflux disease)    Hyperlipidemia    Membranous glomerulonephritis    Morbid obesity (HCC)    Nonischemic cardiomyopathy (HCC)    a. 01/2015 Echo: EF 20-25%, diff HK, Gr 2 DD, Triv AI, mildly dil LA and Ao root.   PONV (postoperative nausea and vomiting)    Seizures (HCC) 02/29/2020   "electralytes were out of wack."   Type II diabetes mellitus (HCC)    a. 01/2015 HbA1c = 8.9.    Past Surgical History:  Procedure Laterality Date   A/V FISTULAGRAM N/A 08/12/2021   Procedure: A/V FISTULAGRAM;  Surgeon: Adine Hoof, MD;  Location: [facility] INVASIVE CV LAB;  Service: Cardiovascular;  Laterality: N/A;   AMPUTATION Left 10/28/2023   Procedure: AMPUTATION RAY FOOT;  Surgeon: Evertt Hoe, DPM;  Location: [facility] OR;  Service: Orthopedics/Podiatry;  Laterality: Left;  Partial 3rd ray resection, I&D, antibiotic beads, wound vac   AV FISTULA PLACEMENT Right 12/18/2020   Procedure: RIGHT ARM RADIOCEPHALIC  ARTERIOVENOUS (AV) FISTULA CREATION;  Surgeon:  Adine Hoof, MD;  Location: [facility] OR;  Service: Vascular;  Laterality: Right;   CARDIAC CATHETERIZATION N/A 02/01/2015   Procedure: Right/Left Heart Cath and Coronary Angiography;  Surgeon: Loyde Rule, MD;  [facility] CV LAB;  Service: Cardiovascular;  Laterality: N/A;   INCISION AND DRAINAGE Left 09/05/2023   Procedure: INCISION AND DRAINAGE WITH 3RD TOE METATARSAL RESECTION LEFT FOOT AND 4TH TOE PROXIMAL PHALANX RESECTION;  Surgeon: Evertt Hoe, DPM;  Location: [facility] OR;  Service: Orthopedics/Podiatry;  Laterality: Left;  I&D with possible metatarsal resection, left foot   IR FLUORO GUIDE CV LINE LEFT  07/21/2022   IR FLUORO GUIDE CV LINE LEFT  07/21/2022   IR FLUORO GUIDE CV LINE RIGHT  10/11/2019   IR REMOVAL TUN CV CATH W/O FL  07/17/2022   IR REMOVAL TUN CV CATH W/O FL  08/18/2022   IR US  GUIDE VASC ACCESS LEFT  07/21/2022   IR US  GUIDE VASC ACCESS LEFT  07/21/2022   IR US  GUIDE VASC ACCESS RIGHT  10/11/2019   IRRIGATION AND DEBRIDEMENT FOOT Left 10/30/2023   Procedure: IRRIGATION AND DEBRIDEMENT WOUND;  Surgeon: Evertt Hoe, DPM;  Location: [facility] OR;  Service: Orthopedics/Podiatry;  Laterality: Left;   RIGHT/LEFT HEART CATH AND CORONARY ANGIOGRAPHY N/A 02/02/2024   Procedure: RIGHT/LEFT HEART CATH AND CORONARY ANGIOGRAPHY;  Surgeon: Darlis Eisenmenger,  MD;  Location: [facility] INVASIVE CV LAB;  Service: Cardiovascular;  Laterality: N/A;   TEE WITHOUT CARDIOVERSION N/A 07/18/2022   Procedure: TRANSESOPHAGEAL ECHOCARDIOGRAM (TEE);  Surgeon: Bridgette Campus, MD;  Location: [facility] ENDOSCOPY;  Service: Cardiovascular;  Laterality: N/A;   THORACOTOMY Left 11/08/2013   Procedure: LEFT THORACOTOMY;  Surgeon: Heriberto London, MD;  Location: [facility] OR;  Service: Thoracic;  Laterality: Left;   TOOTH EXTRACTION N/A 10/30/2023   Procedure: DENTAL RESTORATION/EXTRACTIONS;  Surgeon: Ascencion Lava, DMD;  Location: [facility] OR;  Service: Oral Surgery;  Laterality: N/A;    TRANSESOPHAGEAL ECHOCARDIOGRAM (CATH LAB) N/A 10/29/2023   Procedure: TRANSESOPHAGEAL ECHOCARDIOGRAM;  Surgeon: Darlis Eisenmenger, MD;  Location: [facility] INVASIVE CV LAB;  Service: Cardiovascular;  Laterality: N/A;   TUNNELLED CATHETER EXCHANGE N/A 01/21/2024   Procedure: TUNNELLED CATHETER EXCHANGE;  Surgeon: Baron Border, MD;  Location: [facility] INVASIVE CV LAB;  Service: Cardiovascular;  Laterality: N/A;    Allergies  Allergen Reactions   Acyclovir And Related Hives   Imdur  [Isosorbide  Nitrate]     Headaches from nitrate    Prior to Admission medications   Medication Sig Start Date End Date Taking? Authorizing Provider  Accu-Chek Softclix Lancets lancets Use as directed four times daily. 10/15/23     acetaminophen  (TYLENOL ) 500 MG tablet Take 1,000 mg by mouth every 6 (six) hours as needed for moderate pain.    [provider]  atorvastatin  (LIPITOR ) 80 MG tablet Take 1 tablet (80 mg total) by mouth daily. 01/20/24     blood glucose meter kit and supplies KIT Use to check blood sugar up to four times daily as directed. 10/15/23   Masters, Katie, DO  carvedilol  (COREG ) 25 MG tablet Take 2 tablets (50 mg total) by mouth 2 (two) times daily. 10/15/23   Masters, Katie, DO  cephALEXin  (KEFLEX ) 500 MG capsule Take 1 capsule (500 mg total) by mouth 4 (four) times daily for 5 days. 02/09/24 02/14/24  Angelia Kelp, PA-C  glucose blood (ACCU-CHEK GUIDE TEST) test strip Use as directed four times daily. 10/15/23     hydrALAZINE  (APRESOLINE ) 50 MG tablet Take 1 tablet by mouth 3 times a day 01/04/24     insulin  aspart (NOVOLOG  FLEXPEN) 100 UNIT/ML FlexPen Inject 15 Units into the skin 3 (three) times daily as needed for high blood sugar. 02/05/24   Jonelle Neri, DO  insulin  glargine-yfgn (SEMGLEE ) 100 UNIT/ML Pen Inject 15 Units into the skin daily. Patient taking differently: Inject 15 Units into the skin every evening. 11/01/23   Masters, Alston Jerry, DO  methocarbamol  (ROBAXIN ) 500 MG tablet Take 1 tablet  (500 mg total) by mouth daily as needed. 01/26/24     multivitamin (RENA-VIT) TABS tablet Take 1 tablet by mouth daily.    [provider]  nitroGLYCERIN  (NITROSTAT ) 0.4 MG SL tablet Place 1 tablet (0.4 mg total) under the tongue every 5 (five) minutes x 3 doses as needed within 15 minutes. If chest pain does not resolve, seek medical attention. 11/10/23   Clegg, Amy D, NP  ondansetron  (ZOFRAN ) 4 MG tablet Take 1 tablet (4 mg total) by mouth daily as needed. 11/04/23     pantoprazole  (PROTONIX ) 20 MG tablet Take 1 tablet (20 mg total) by mouth daily as directed for reflux. 02/03/24     pantoprazole  (PROTONIX ) 40 MG tablet Take 1 tablet (40 mg total) by mouth daily. 01/01/24 12/31/24  Alexander-Savino, [place], MD  sevelamer  carbonate (RENVELA ) 800 MG tablet Take 3 tablets (2,400 mg total) by mouth  3 (three) times daily with meals 11/01/23   Masters, Alston Jerry, DO    Social History   Socioeconomic History   Marital status: Single    Spouse name: Not on file   Number of children: 2   Years of education: Not on file   Highest education level: Some college, no degree  Occupational History   Not on file  Tobacco Use   Smoking status: Never   Smokeless tobacco: Never  Vaping Use   Vaping status: Never Used  Substance and Sexual Activity   Alcohol use: No    Alcohol/week: 0.0 standard drinks of alcohol   Drug use: Yes    Types: Marijuana    Comment: last used 09/15/2020   Sexual activity: Yes    Partners: Female  Other Topics Concern   Not on file  Social History Narrative   Lives in [place] by himself.  Currently in [facility].   Caffeine- none       Social Drivers of Corporate investment banker Strain: Not on file  Food Insecurity: No Food Insecurity (10/27/2023)   Hunger Vital Sign    Worried About Running Out of Food in the Last Year: Never true    Ran Out of Food in the Last Year: Never true  Transportation Needs: No Transportation Needs (10/27/2023)   PRAPARE - Therapist, art (Medical): No    Lack of Transportation (Non-Medical): No  Physical Activity: Insufficiently Active (11/27/2021)   Exercise Vital Sign    Days of Exercise per Week: 2 days    Minutes of Exercise per Session: 30 min  Stress: Stress Concern Present (11/27/2021)   [employer] of Occupational Health - Occupational Stress Questionnaire    Feeling of Stress : To some extent  Social Connections: Unknown (02/17/2023)   [facility], [facility]   Social Network    Social Network: Not on file  Intimate Partner Violence: Not At Risk (10/27/2023)   Humiliation, Afraid, Rape, and Kick questionnaire    Fear of Current or Ex-Partner: No    Emotionally Abused: No    Physically Abused: No    Sexually Abused: No     Family History  Problem Relation Age of Onset   Diabetes Mellitus II Mother        died @ 53.   Gastric cancer Mother    CAD Father        died @ 42.   Heart attack Father    Congestive Heart Failure Father    Diabetes Mellitus II Sister    CAD Sister        s/p PCI - age 77.    ROS: [x]  Positive   [ ]  Negative   [ ]  All sytems reviewed and are negative  Cardiovascular: []  chest pain/pressure []  palpitations []  SOB lying flat []  DOE []  pain in legs while walking []  pain in legs at rest []  pain in legs at night []  non-healing ulcers []  hx of DVT []  swelling in legs  Pulmonary: []  productive cough []  asthma/wheezing []  home O2  Neurologic: []  weakness in []  arms []  legs []  numbness in []  arms []  legs []  hx of CVA []  mini stroke [] difficulty speaking or slurred speech []  temporary loss of vision in one eye []  dizziness  Hematologic: []  hx of cancer []  bleeding problems []  problems with blood clotting easily  Endocrine:   []  diabetes []  thyroid disease  GI []  vomiting blood []   blood in stool  GU: []  CKD/renal failure []  HD--[]  M/W/F or []  T/T/S []  burning with urination []  blood in  urine  Psychiatric: []  anxiety []  depression  Musculoskeletal: []  arthritis []  joint pain  Integumentary: []  rashes []  ulcers  Constitutional: []  fever []  chills   Physical Examination  There were no vitals filed for this visit. There is no height or weight on file to calculate BMI.  General:  WDWN in NAD Gait: Not observed HENT: WNL, normocephalic Pulmonary: normal non-labored breathing Cardiac: regular, without  Murmurs, rubs or gallops Abdomen:  soft, NT/ND Vascular Exam/Pulses: Left IJ TDC    CBC    Component Value Date/Time   WBC 7.5 02/02/2024 0551   RBC 5.63 02/02/2024 0551   HGB 15.0 02/02/2024 0804   HGB 15.0 02/02/2024 0804   HCT 44.0 02/02/2024 0804   HCT 44.0 02/02/2024 0804   HCT 23.7 (L) 05/30/2016 1157   PLT 221 02/02/2024 0551   MCV 85.4 02/02/2024 0551   MCH 26.8 02/02/2024 0551   MCHC 31.4 02/02/2024 0551   RDW 18.7 (H) 02/02/2024 0551   LYMPHSABS 2.0 10/28/2023 0742   MONOABS 0.8 10/28/2023 0742   EOSABS 146 12/10/2023 1420   BASOSABS 92 12/10/2023 1420    BMET    Component Value Date/Time   NA 139 02/02/2024 0804   NA 139 02/02/2024 0804   NA 141 10/15/2020 1015   K 5.7 (H) 02/02/2024 0804   K 5.7 (H) 02/02/2024 0804   CL 102 02/02/2024 0551   CO2 22 02/02/2024 0551   GLUCOSE 148 (H) 02/02/2024 0551   BUN 50 (H) 02/02/2024 0551   BUN 62 (H) 10/15/2020 1015   CREATININE 14.84 (H) 02/02/2024 0551   CALCIUM  8.9 02/02/2024 0551   GFRNONAA 4 (L) 02/02/2024 0551   GFRAA 9 (L) 10/15/2020 1015    COAGS: Lab Results  Component Value Date   INR 1.3 (H) 09/05/2023   INR 1.2 07/15/2022   INR 1.01 06/02/2016     Non-Invasive Vascular Imaging:       ASSESSMENT/PLAN: This is a 43 y.o. male with ESRD that presents for dialysis catheter exchange.Patient states his catheter was exchanged about 3 weeks ago and has not worked for the last week.  Currently using left IJ TDC.  Discussed plan for exchange of left IJ TDC under  fluoroscopy and risks benefits discussed.  Young Hensen, MD [facility] Office: [phone]  Young Hensen

## 2024-02-10 NOTE — Op Note (Signed)
    Patient name: Jeremy [name] MRN: [id number] DOB: 04/07/1981 Sex: male  02/10/2024 Pre-operative Diagnosis: Poorly functioning left internal jugular TDC Post-operative diagnosis:  Same Surgeon:  Young Hensen, MD Procedure Performed: 1.  Catheter exchange left IJ TDC (28 cm palindrome) under fluoroscopy  Indications: 43 year old male with end-stage renal disease currently using a left IJ TDC.  He presents for catheter exchange due to poorly functioning catheter.  Risks benefits discussed.  Findings:   The left internal jugular tunneled dialysis catheter appeared to be in good position.  I used a stiff Glidewire to exchange for a new 28 cm palindrome catheter with the tip in the right atrium from the left IJ approach.  This flushed and aspirated easily.   Procedure:  The patient was identified in the holding area and taken to [facility] lab.  Placed on the operating table in the supine position.  The catheter and left chest wall were prepped and draped in standard sterile fashion.  Timeout was performed.  I did evaluate the catheter under fluoroscopy and this looked to be in good position with no kinking.  I used a stiff Glidewire down the left IJ catheter under fluoroscopy into the right atrium and exchanged for a new 28 cm palindrome over the wire.  The previous catheter was removed in its entirety.  I advanced the new catheter slightly further in the right atrium.  This flushed and aspirated easily.  It was loaded with heparin  according to manufactures recommendations.  It was secured with a 2-0 nylon.  Taken to recovery in stable condition.   Young Hensen, MD [facility] Office: [phone]

## 2024-02-11 ENCOUNTER — Encounter (HOSPITAL_COMMUNITY): Payer: Self-pay | Admitting: Vascular Surgery

## 2024-02-11 ENCOUNTER — Ambulatory Visit (HOSPITAL_COMMUNITY): Attending: Family Medicine

## 2024-02-11 ENCOUNTER — Other Ambulatory Visit (HOSPITAL_COMMUNITY): Payer: Self-pay

## 2024-02-12 ENCOUNTER — Other Ambulatory Visit: Payer: Self-pay

## 2024-02-12 ENCOUNTER — Ambulatory Visit (HOSPITAL_COMMUNITY): Payer: Self-pay | Admitting: Family Medicine

## 2024-02-12 ENCOUNTER — Other Ambulatory Visit (HOSPITAL_BASED_OUTPATIENT_CLINIC_OR_DEPARTMENT_OTHER): Payer: Self-pay

## 2024-02-12 ENCOUNTER — Ambulatory Visit (HOSPITAL_COMMUNITY)
Admission: RE | Admit: 2024-02-12 | Discharge: 2024-02-12 | Disposition: A | Discharge status: Home / Self Care | Source: Ambulatory Visit | Attending: Family Medicine | Admitting: Family Medicine

## 2024-02-12 ENCOUNTER — Other Ambulatory Visit (HOSPITAL_COMMUNITY): Payer: Self-pay

## 2024-02-12 DIAGNOSIS — R1031 Right lower quadrant pain: Secondary | ICD-10-CM | POA: Diagnosis not present

## 2024-02-12 MED ORDER — XPHOZAH 30 MG PO TABS: ORAL_TABLET | ORAL | 3 refills | Status: AC

## 2024-02-21 ENCOUNTER — Encounter: Payer: Self-pay | Admitting: *Deleted

## 2024-02-22 ENCOUNTER — Encounter (HOSPITAL_COMMUNITY): Payer: Self-pay | Admitting: *Deleted

## 2024-02-22 ENCOUNTER — Encounter (HOSPITAL_COMMUNITY): Payer: Self-pay

## 2024-02-22 ENCOUNTER — Emergency Department (HOSPITAL_COMMUNITY)
Admission: EM | Admit: 2024-02-22 | Discharge: 2024-02-23 | Disposition: A | Discharge status: Home / Self Care | Attending: Emergency Medicine | Admitting: Emergency Medicine

## 2024-02-22 ENCOUNTER — Other Ambulatory Visit: Payer: Self-pay

## 2024-02-22 ENCOUNTER — Other Ambulatory Visit (HOSPITAL_COMMUNITY): Payer: Self-pay

## 2024-02-22 ENCOUNTER — Emergency Department (HOSPITAL_COMMUNITY)

## 2024-02-22 DIAGNOSIS — R0602 Shortness of breath: Secondary | ICD-10-CM | POA: Diagnosis not present

## 2024-02-22 DIAGNOSIS — I120 Hypertensive chronic kidney disease with stage 5 chronic kidney disease or end stage renal disease: Secondary | ICD-10-CM | POA: Diagnosis not present

## 2024-02-22 DIAGNOSIS — R918 Other nonspecific abnormal finding of lung field: Secondary | ICD-10-CM | POA: Diagnosis not present

## 2024-02-22 DIAGNOSIS — R0989 Other specified symptoms and signs involving the circulatory and respiratory systems: Secondary | ICD-10-CM | POA: Diagnosis not present

## 2024-02-22 DIAGNOSIS — Z992 Dependence on renal dialysis: Secondary | ICD-10-CM | POA: Diagnosis not present

## 2024-02-22 DIAGNOSIS — N186 End stage renal disease: Secondary | ICD-10-CM | POA: Diagnosis not present

## 2024-02-22 DIAGNOSIS — I517 Cardiomegaly: Secondary | ICD-10-CM | POA: Diagnosis not present

## 2024-02-22 LAB — CBC WITH DIFFERENTIAL/PLATELET
Abs Immature Granulocytes: 0.02 10*3/uL (ref 0.00–0.07)
Basophils Absolute: 0.1 10*3/uL (ref 0.0–0.1)
Basophils Relative: 1 %
Eosinophils Absolute: 0.2 10*3/uL (ref 0.0–0.5)
Eosinophils Relative: 2 %
HCT: 38.6 % — ABNORMAL LOW (ref 39.0–52.0)
Hemoglobin: 12 g/dL — ABNORMAL LOW (ref 13.0–17.0)
Immature Granulocytes: 0 %
Lymphocytes Relative: 23 %
Lymphs Abs: 2.2 10*3/uL (ref 0.7–4.0)
MCH: 25.9 pg — ABNORMAL LOW (ref 26.0–34.0)
MCHC: 31.1 g/dL (ref 30.0–36.0)
MCV: 83.4 fL (ref 80.0–100.0)
Monocytes Absolute: 0.7 10*3/uL (ref 0.1–1.0)
Monocytes Relative: 8 %
Neutro Abs: 6.3 10*3/uL (ref 1.7–7.7)
Neutrophils Relative %: 66 %
Platelets: 312 10*3/uL (ref 150–400)
RBC: 4.63 MIL/uL (ref 4.22–5.81)
RDW: 19 % — ABNORMAL HIGH (ref 11.5–15.5)
WBC: 9.5 10*3/uL (ref 4.0–10.5)
nRBC: 0 % (ref 0.0–0.2)

## 2024-02-22 LAB — BASIC METABOLIC PANEL WITH GFR
Anion gap: 18 — ABNORMAL HIGH (ref 5–15)
BUN: 97 mg/dL — ABNORMAL HIGH (ref 6–20)
CO2: 23 mmol/L (ref 22–32)
Calcium: 9.5 mg/dL (ref 8.9–10.3)
Chloride: 101 mmol/L (ref 98–111)
Creatinine, Ser: 19.26 mg/dL — ABNORMAL HIGH (ref 0.61–1.24)
GFR, Estimated: 3 mL/min — ABNORMAL LOW (ref >60)
Glucose, Bld: 121 mg/dL — ABNORMAL HIGH (ref 70–99)
Potassium: 5.4 mmol/L — ABNORMAL HIGH (ref 3.5–5.1)
Sodium: 142 mmol/L (ref 135–145)

## 2024-02-22 LAB — BRAIN NATRIURETIC PEPTIDE: B Natriuretic Peptide: 1788.8 pg/mL — ABNORMAL HIGH (ref 0.0–100.0)

## 2024-02-22 MED ORDER — SODIUM ZIRCONIUM CYCLOSILICATE 10 G PO PACK
10.0000 g | PACK | Freq: Once | ORAL | Status: AC
  Administered 2024-02-22: 10 g via ORAL
  Filled 2024-02-22: qty 1

## 2024-02-22 NOTE — ED Notes (Signed)
 CCMD called.

## 2024-02-22 NOTE — ED Provider Triage Note (Signed)
 Emergency Medicine Provider Triage Evaluation Note  Jeremy [name] , a 43 y.o. male  was evaluated in triage.  Pt complains of shortness of breath.  Patient is on dialysis Monday Wednesday Friday, missed dialysis today.  Patient feels fluid overloaded.  Review of Systems  Positive: Shortness of breath Negative: Chest pain, fever, chills, nausea, vomiting, confusion  Physical Exam  There were no vitals taken for this visit. Gen:   Awake, uncomfortable appearing Resp:  Mildly tachypneic MSK:   Moves extremities without difficulty  Other:    Medical Decision Making  Medically screening exam initiated at 8:46 PM.  Appropriate orders placed.  Jeremy [name] was informed that the remainder of the evaluation will be completed by another provider, this initial triage assessment does not replace that evaluation, and the importance of remaining in the ED until their evaluation is complete.  Labs and imaging ordered   Jeremy [name] 02/22/24 2048

## 2024-02-22 NOTE — ED Provider Notes (Incomplete)
 [place] EMERGENCY DEPARTMENT AT [facility] Provider Note   CSN: [id number] Arrival date & time: 02/22/24  2039     Patient presents with: Shortness of Breath   Jeremy [name] is a 43 y.o. male.  {Add pertinent medical, surgical, social history, OB history to NWG:95621} The history is provided by the patient and medical records.  Shortness of Breath  43 y.o. M with hx of ESRD on HD, presenting to the ED for   Prior to Admission medications   Medication Sig Start Date End Date Taking? Authorizing Provider  Accu-Chek Softclix Lancets lancets Use as directed four times daily. 10/15/23     acetaminophen  (TYLENOL ) 500 MG tablet Take 1,000 mg by mouth every 6 (six) hours as needed for moderate pain.    [provider]  atorvastatin  (LIPITOR ) 80 MG tablet Take 1 tablet (80 mg total) by mouth daily. 01/20/24     blood glucose meter kit and supplies KIT Use to check blood sugar up to four times daily as directed. 10/15/23   Masters, Alston Jerry, DO  carvedilol  (COREG ) 25 MG tablet Take 2 tablets (50 mg total) by mouth 2 (two) times daily. 10/15/23   Masters, Katie, DO  glucose blood (ACCU-CHEK GUIDE TEST) test strip Use as directed four times daily. 10/15/23     hydrALAZINE  (APRESOLINE ) 50 MG tablet Take 1 tablet by mouth 3 times a day 01/04/24     insulin  aspart (NOVOLOG  FLEXPEN) 100 UNIT/ML FlexPen Inject 15 Units into the skin 3 (three) times daily as needed for high blood sugar. 02/05/24   Jonelle Neri, DO  insulin  glargine-yfgn (SEMGLEE ) 100 UNIT/ML Pen Inject 15 Units into the skin daily. Patient taking differently: Inject 15 Units into the skin every evening. 11/01/23   Masters, Alston Jerry, DO  methocarbamol  (ROBAXIN ) 500 MG tablet Take 1 tablet (500 mg total) by mouth daily as needed. 01/26/24     multivitamin (RENA-VIT) TABS tablet Take 1 tablet by mouth daily.    [provider]  nitroGLYCERIN  (NITROSTAT ) 0.4 MG SL tablet Place 1 tablet (0.4 mg total) under the tongue every 5  (five) minutes x 3 doses as needed within 15 minutes. If chest pain does not resolve, seek medical attention. 11/10/23   Clegg, Amy D, NP  ondansetron  (ZOFRAN ) 4 MG tablet Take 1 tablet (4 mg total) by mouth daily as needed. 11/04/23     pantoprazole  (PROTONIX ) 20 MG tablet Take 1 tablet (20 mg total) by mouth daily as directed for reflux. 02/03/24     pantoprazole  (PROTONIX ) 40 MG tablet Take 1 tablet (40 mg total) by mouth daily. 01/01/24 12/31/24  Alexander-Savino, [place], MD  sevelamer  carbonate (RENVELA ) 800 MG tablet Take 3 tablets (2,400 mg total) by mouth 3 (three) times daily with meals 11/01/23   Masters, Alston Jerry, DO  Tenapanor  HCl, CKD, (XPHOZAH ) 30 MG TABS Take 1 tablet by mouth twice a day as directed Take 1 in the morning and 1 at night 02/11/24       Allergies: Acyclovir and related and Imdur  [isosorbide  nitrate]    Review of Systems  Respiratory:  Positive for shortness of breath.     Updated Vital Signs BP 137/67 (BP Location: Right Arm)   Pulse 89   Temp 98.8 F (37.1 C)   Resp 18   Ht 6' 5 (1.956 m)   Wt 115.7 kg   SpO2 100%   BMI 30.24 kg/m   Physical Exam  (all labs ordered are listed, but only abnormal results are  displayed) Labs Reviewed  BASIC METABOLIC PANEL WITH GFR - Abnormal; Notable for the following components:      Result Value   Potassium 5.4 (*)    Glucose, Bld 121 (*)    BUN 97 (*)    Creatinine, Ser 19.26 (*)    GFR, Estimated 3 (*)    Anion gap 18 (*)    All other components within normal limits  CBC WITH DIFFERENTIAL/PLATELET - Abnormal; Notable for the following components:   Hemoglobin 12.0 (*)    HCT 38.6 (*)    MCH 25.9 (*)    RDW 19.0 (*)    All other components within normal limits  BRAIN NATRIURETIC PEPTIDE - Abnormal; Notable for the following components:   B Natriuretic Peptide 1,788.8 (*)    All other components within normal limits    EKG: None  Radiology: DG Chest 2 View Result Date: 02/22/2024 CLINICAL DATA:  Shortness  of breath EXAM: CHEST - 2 VIEW COMPARISON:  10/26/2023 FINDINGS: Left-sided central venous catheter tip at the cavoatrial region. Cardiomegaly with mild central congestion. No consolidation, pleural effusion or pneumothorax. Probable patchy atelectasis at the left base. Cyst IMPRESSION: Cardiomegaly with mild central congestion. Probable patchy atelectasis at the left base. Electronically Signed   By: Esmeralda Hedge M.D.   On: 02/22/2024 21:04    {Document cardiac monitor, telemetry assessment procedure when appropriate:32947} Procedures   Medications Ordered in the ED - No data to display    {Click here for ABCD2, HEART and other calculators REFRESH Note before signing:1}                              Medical Decision Making  ***  {Document critical care time when appropriate  Document review of labs and clinical decision tools ie CHADS2VASC2, etc  Document your independent review of radiology images and any outside records  Document your discussion with family members, caretakers and with consultants  Document social determinants of health affecting pt's care  Document your decision making why or why not admission, treatments were needed:32947:::1}   Final diagnoses:  None    ED Discharge Orders     None

## 2024-02-22 NOTE — ED Triage Notes (Signed)
 Pt to ED c/o [facility] that started yesterday, missed dialysis today, last treatment Friday. MWF schedule. Reports relief with home albuterol .

## 2024-02-23 NOTE — ED Notes (Addendum)
 Introduced self to patient. Informed patient there was an order for PIV placement. Patient asked that no IV be placed unless necessary.

## 2024-02-23 NOTE — Discharge Instructions (Addendum)
 Go to do your dialysis center since they have space for you this morning.  If you cannot get treatment for some reason, please return to the ED for HD.

## 2024-03-01 ENCOUNTER — Other Ambulatory Visit: Payer: Self-pay | Admitting: *Deleted

## 2024-03-01 DIAGNOSIS — N186 End stage renal disease: Secondary | ICD-10-CM

## 2024-03-01 NOTE — Progress Notes (Signed)
US orders placed.

## 2024-03-04 ENCOUNTER — Encounter (HOSPITAL_COMMUNITY)
Admission: RE | Disposition: A | Discharge status: Home / Self Care | Payer: Self-pay | Source: Home / Self Care | Attending: Vascular Surgery

## 2024-03-04 ENCOUNTER — Encounter (HOSPITAL_COMMUNITY): Payer: Self-pay | Admitting: Vascular Surgery

## 2024-03-04 ENCOUNTER — Ambulatory Visit (HOSPITAL_COMMUNITY)
Admission: RE | Admit: 2024-03-04 | Discharge: 2024-03-04 | Disposition: A | Discharge status: Home / Self Care | Attending: Vascular Surgery | Admitting: Vascular Surgery

## 2024-03-04 ENCOUNTER — Other Ambulatory Visit: Payer: Self-pay

## 2024-03-04 DIAGNOSIS — I132 Hypertensive heart and chronic kidney disease with heart failure and with stage 5 chronic kidney disease, or end stage renal disease: Secondary | ICD-10-CM | POA: Insufficient documentation

## 2024-03-04 DIAGNOSIS — Z794 Long term (current) use of insulin: Secondary | ICD-10-CM | POA: Insufficient documentation

## 2024-03-04 DIAGNOSIS — T82898A Other specified complication of vascular prosthetic devices, implants and grafts, initial encounter: Secondary | ICD-10-CM

## 2024-03-04 DIAGNOSIS — Y839 Surgical procedure, unspecified as the cause of abnormal reaction of the patient, or of later complication, without mention of misadventure at the time of the procedure: Secondary | ICD-10-CM | POA: Diagnosis not present

## 2024-03-04 DIAGNOSIS — T8249XA Other complication of vascular dialysis catheter, initial encounter: Secondary | ICD-10-CM | POA: Insufficient documentation

## 2024-03-04 DIAGNOSIS — Z79899 Other long term (current) drug therapy: Secondary | ICD-10-CM | POA: Insufficient documentation

## 2024-03-04 DIAGNOSIS — E1122 Type 2 diabetes mellitus with diabetic chronic kidney disease: Secondary | ICD-10-CM | POA: Insufficient documentation

## 2024-03-04 DIAGNOSIS — N186 End stage renal disease: Secondary | ICD-10-CM | POA: Diagnosis not present

## 2024-03-04 DIAGNOSIS — I509 Heart failure, unspecified: Secondary | ICD-10-CM | POA: Insufficient documentation

## 2024-03-04 DIAGNOSIS — Z992 Dependence on renal dialysis: Secondary | ICD-10-CM | POA: Insufficient documentation

## 2024-03-04 HISTORY — PX: TUNNELLED CATHETER EXCHANGE: CATH118373

## 2024-03-04 SURGERY — TUNNELLED CATHETER EXCHANGE
Anesthesia: LOCAL | Site: Chest | Laterality: Left

## 2024-03-04 MED ORDER — LIDOCAINE HCL (PF) 1 % IJ SOLN
INTRAMUSCULAR | Status: DC | PRN
Start: 2024-03-04 — End: 2024-03-04
  Administered 2024-03-04: 15 mL

## 2024-03-04 MED ORDER — LIDOCAINE HCL (PF) 1 % IJ SOLN
INTRAMUSCULAR | Status: AC
  Filled 2024-03-04: qty 30

## 2024-03-04 MED ORDER — HEPARIN (PORCINE) IN NACL 1000-0.9 UT/500ML-% IV SOLN
INTRAVENOUS | Status: DC | PRN
  Administered 2024-03-04: 500 mL

## 2024-03-04 MED ORDER — HEPARIN SODIUM (PORCINE) 1000 UNIT/ML IJ SOLN
INTRAMUSCULAR | Status: DC | PRN
Start: 2024-03-04 — End: 2024-03-04
  Administered 2024-03-04 (×2): 2.1 [IU] via INTRAVENOUS

## 2024-03-04 MED ORDER — HEPARIN SODIUM (PORCINE) 1000 UNIT/ML IJ SOLN
INTRAMUSCULAR | Status: AC
  Filled 2024-03-04: qty 10

## 2024-03-04 SURGICAL SUPPLY — 3 items
CATH PALINDROME-P 28CM W/VT (CATHETERS) IMPLANT
GUIDEWIRE LT ZIPWIRE 035X260 (WIRE) IMPLANT
TRAY PV CATH (CUSTOM PROCEDURE TRAY) ×1 IMPLANT

## 2024-03-04 NOTE — Op Note (Signed)
    Patient name: Jeremy [name] MRN: [id number] DOB: [birth year] Sex: male  03/04/2024 Pre-operative Diagnosis: Malfunctioning left IJ TDC Post-operative diagnosis:  Same Surgeon:  Lonni DOROTHA Gaskins, MD Procedure Performed: 1.  Exchange of left IJ TDC (28 cm palindrome)  Indications: 43 year old male with end-stage renal disease using a left IJ TDC that was recently exchanged.  States this is running slow and not functioning correctly.  Findings: The left IJ TDC appeared to be in good position and was not kinked.  I used a stiff Glidewire to exchange for a new 28 cm palindrome catheter and I placed the tip further into the right atrium.  He declined new placement on the right side in the IJ or femoral catheter.   Procedure:  The patient was identified in the holding area and taken to [facility] lab.  Patient was placed on the table in supine position.  Prepped and draped in standard sterile fashion.  Timeout performed.  I evaluated the previous left IJ TDC under fluoroscopy and there was no kinking and it appeared to be in good position.  I used local around the exit site in the chest wall.  I placed a stiff Glidewire through the catheter and this was completely removed intact under fluoroscopy.  I then placed a new 28 cm palindrome but I put the tip further into the right atrium.  This flushed and aspirated easily.  It was secured with a 3-0 nylon.  Loaded with heparinized saline according manufactures recommendations.  Taken to holding in stable condition.     Lonni DOROTHA Gaskins, MD [facility] Office: [phone]

## 2024-03-04 NOTE — H&P (Signed)
 Hospital Consult    Reason for Consult:  [facility] exchange Referring Physician:   MRN #:  [id number]  History of Present Illness: This is a 43 y.o. male with ESRD that presents for [facility] catheter exchange.  States this has not been working for the last several weeks since it was last exchanged.  Awaiting appointment with [facility] for new access placement.  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 01/2015 Lexiscan  MV: EF 28%, inferior, inferolateral, apical ischemia;  b. 01/2015 Cath: nl cors, PCWP 18 mmHg, CO 9.38 L/min, CI 3.53 L/min/m^2.   CHF (congestive heart failure) (HCC)    Depression    Situational   ESRD (end stage renal disease) (HCC)    TTHS- [place]   Essential hypertension    Family history of adverse reaction to anesthesia    sister- it was too much for her heart died   Flash pulmonary edema (HCC) 09/18/2022   GERD (gastroesophageal reflux disease)    Hyperlipidemia    Membranous glomerulonephritis    Morbid obesity (HCC)    Nonischemic cardiomyopathy (HCC)    a. 01/2015 Echo: EF 20-25%, diff HK, Gr 2 DD, Triv AI, mildly dil LA and Ao root.   PONV (postoperative nausea and vomiting)    Seizures (HCC) 02/29/2020   electralytes were out of wack.   Type II diabetes mellitus (HCC)    a. 01/2015 HbA1c = 8.9.    Past Surgical History:  Procedure Laterality Date   A/V FISTULAGRAM N/A 08/12/2021   Procedure: A/V FISTULAGRAM;  Surgeon: Sheree Penne Bruckner, MD;  Location: [facility] INVASIVE CV LAB;  Service: Cardiovascular;  Laterality: N/A;   AMPUTATION Left 10/28/2023   Procedure: AMPUTATION RAY FOOT;  Surgeon: Malvin Marsa FALCON, DPM;  Location: [facility] OR;  Service: Orthopedics/Podiatry;  Laterality: Left;  Partial 3rd ray resection, I&D, antibiotic beads, wound vac   AV FISTULA PLACEMENT Right 12/18/2020    Procedure: RIGHT ARM RADIOCEPHALIC  ARTERIOVENOUS (AV) FISTULA CREATION;  Surgeon: Sheree Penne Bruckner, MD;  Location: [facility] OR;  Service: Vascular;  Laterality: Right;   CARDIAC CATHETERIZATION N/A 02/01/2015   Procedure: Right/Left Heart Cath and Coronary Angiography;  Surgeon: Maude JAYSON Emmer, MD;  Location: [facility] INVASIVE CV LAB;  Service: Cardiovascular;  Laterality: N/A;   INCISION AND DRAINAGE Left 09/05/2023   Procedure: INCISION AND DRAINAGE WITH 3RD TOE METATARSAL RESECTION LEFT FOOT AND 4TH TOE PROXIMAL PHALANX RESECTION;  Surgeon: Malvin Marsa FALCON, DPM;  Location: [facility] OR;  Service: Orthopedics/Podiatry;  Laterality: Left;  I&D with possible metatarsal resection, left foot   IR FLUORO GUIDE CV LINE LEFT  07/21/2022   IR FLUORO GUIDE CV LINE LEFT  07/21/2022   IR FLUORO GUIDE CV LINE RIGHT  10/11/2019   IR REMOVAL TUN CV CATH W/O FL  07/17/2022   IR REMOVAL TUN CV CATH W/O FL  08/18/2022   IR US  GUIDE VASC ACCESS LEFT  07/21/2022   IR US  GUIDE VASC ACCESS LEFT  07/21/2022   IR US  GUIDE VASC ACCESS RIGHT  10/11/2019   IRRIGATION AND DEBRIDEMENT FOOT Left 10/30/2023   Procedure: IRRIGATION AND DEBRIDEMENT WOUND;  Surgeon: Malvin Marsa FALCON, DPM;  Location: [facility] OR;  Service: Orthopedics/Podiatry;  Laterality: Left;   RIGHT/LEFT HEART CATH AND CORONARY ANGIOGRAPHY N/A 02/02/2024  Procedure: RIGHT/LEFT HEART CATH AND CORONARY ANGIOGRAPHY;  Surgeon: Rolan Ezra RAMAN, MD;  Location: [facility] INVASIVE CV LAB;  Service: Cardiovascular;  Laterality: N/A;   TEE WITHOUT CARDIOVERSION N/A 07/18/2022   Procedure: TRANSESOPHAGEAL ECHOCARDIOGRAM (TEE);  Surgeon: Alvan Ronal BRAVO, MD;  Location: [facility] ENDOSCOPY;  Service: Cardiovascular;  Laterality: N/A;   THORACOTOMY Left 11/08/2013   Procedure: LEFT THORACOTOMY;  Surgeon: Maude Fleeta Ochoa, MD;  Location: [facility] OR;  Service: Thoracic;  Laterality: Left;   TOOTH EXTRACTION N/A 10/30/2023   Procedure: DENTAL RESTORATION/EXTRACTIONS;  Surgeon: Sheryle Hamilton, DMD;  Location: [facility] OR;  Service: Oral Surgery;  Laterality: N/A;   TRANSESOPHAGEAL ECHOCARDIOGRAM (CATH LAB) N/A 10/29/2023   Procedure: TRANSESOPHAGEAL ECHOCARDIOGRAM;  Surgeon: Rolan Ezra RAMAN, MD;  Location: [facility] INVASIVE CV LAB;  Service: Cardiovascular;  Laterality: N/A;   TUNNELLED CATHETER EXCHANGE N/A 01/21/2024   Procedure: TUNNELLED CATHETER EXCHANGE;  Surgeon: Norine Manuelita LABOR, MD;  Location: [facility] INVASIVE CV LAB;  Service: Cardiovascular;  Laterality: N/A;   TUNNELLED CATHETER EXCHANGE N/A 02/10/2024   Procedure: TUNNELLED CATHETER EXCHANGE;  Surgeon: Gretta Lonni PARAS, MD;  Location: [facility] PV LAB;  Service: Cardiovascular;  Laterality: N/A;    Allergies  Allergen Reactions   Acyclovir And Related Hives   Imdur  [Isosorbide  Nitrate]     Headaches from nitrate    Prior to Admission medications   Medication Sig Start Date End Date Taking? Authorizing Provider  Accu-Chek Softclix Lancets lancets Use as directed four times daily. 10/15/23     acetaminophen  (TYLENOL ) 500 MG tablet Take 1,000 mg by mouth every 6 (six) hours as needed for moderate pain.    [provider]  atorvastatin  (LIPITOR ) 80 MG tablet Take 1 tablet (80 mg total) by mouth daily. 01/20/24     blood glucose meter kit and supplies KIT Use to check blood sugar up to four times daily as directed. 10/15/23   Masters, Katie, DO  carvedilol  (COREG ) 25 MG tablet Take 2 tablets (50 mg total) by mouth 2 (two) times daily. 10/15/23   Masters, Katie, DO  glucose blood (ACCU-CHEK GUIDE TEST) test strip Use as directed four times daily. 10/15/23     hydrALAZINE  (APRESOLINE ) 50 MG tablet Take 1 tablet by mouth 3 times a day 01/04/24     insulin  aspart (NOVOLOG  FLEXPEN) 100 UNIT/ML FlexPen Inject 15 Units into the skin 3 (three) times daily as needed for high blood sugar. 02/05/24   Tobie Gaines, DO  insulin  glargine-yfgn (SEMGLEE ) 100 UNIT/ML Pen Inject 15 Units into the skin daily. Patient taking differently: Inject 15 Units  into the skin every evening. 11/01/23   Masters, Izetta, DO  methocarbamol  (ROBAXIN ) 500 MG tablet Take 1 tablet (500 mg total) by mouth daily as needed. 01/26/24     multivitamin (RENA-VIT) TABS tablet Take 1 tablet by mouth daily.    [provider]  nitroGLYCERIN  (NITROSTAT ) 0.4 MG SL tablet Place 1 tablet (0.4 mg total) under the tongue every 5 (five) minutes x 3 doses as needed within 15 minutes. If chest pain does not resolve, seek medical attention. 11/10/23   Clegg, Amy D, NP  ondansetron  (ZOFRAN ) 4 MG tablet Take 1 tablet (4 mg total) by mouth daily as needed. 11/04/23     pantoprazole  (PROTONIX ) 20 MG tablet Take 1 tablet (20 mg total) by mouth daily as directed for reflux. 02/03/24     pantoprazole  (PROTONIX ) 40 MG tablet Take 1 tablet (40 mg total) by mouth daily. 01/01/24 12/31/24  Volney Leash, MD  sevelamer  carbonate (RENVELA ) 800 MG tablet Take 3 tablets (2,400 mg total) by mouth 3 (three) times daily with meals 11/01/23   Masters, Izetta, DO  Tenapanor  HCl, CKD, (XPHOZAH ) 30 MG TABS Take 1 tablet by mouth twice a day as directed Take 1 in the morning and 1 at night 02/11/24       Social History   Socioeconomic History   Marital status: Single    Spouse name: Not on file   Number of children: 2   Years of education: Not on file   Highest education level: Some college, no degree  Occupational History   Not on file  Tobacco Use   Smoking status: Never   Smokeless tobacco: Never  Vaping Use   Vaping status: Never Used  Substance and Sexual Activity   Alcohol use: No    Alcohol/week: 0.0 standard drinks of alcohol   Drug use: Yes    Types: Marijuana    Comment: last used 09/15/2020   Sexual activity: Yes    Partners: Female  Other Topics Concern   Not on file  Social History Narrative   Lives in [place] by himself.  Currently in [facility].   Caffeine- none       Social Drivers of Corporate investment banker Strain: Not on file  Food Insecurity: No Food  Insecurity (10/27/2023)   Hunger Vital Sign    Worried About Running Out of Food in the Last Year: Never true    Ran Out of Food in the Last Year: Never true  Transportation Needs: No Transportation Needs (10/27/2023)   PRAPARE - Administrator, Civil Service (Medical): No    Lack of Transportation (Non-Medical): No  Physical Activity: Insufficiently Active (11/27/2021)   Exercise Vital Sign    Days of Exercise per Week: 2 days    Minutes of Exercise per Session: 30 min  Stress: Stress Concern Present (11/27/2021)   [employer] of Occupational Health - Occupational Stress Questionnaire    Feeling of Stress : To some extent  Social Connections: Unknown (02/17/2023)   Received from [facility]   Social Network    Social Network: Not on file  Intimate Partner Violence: Not At Risk (10/27/2023)   Humiliation, Afraid, Rape, and Kick questionnaire    Fear of Current or Ex-Partner: No    Emotionally Abused: No    Physically Abused: No    Sexually Abused: No    Okay Family History  Problem Relation Age of Onset   Diabetes Mellitus II Mother        died @ 81.   Gastric cancer Mother    CAD Father        died @ 61.   Heart attack Father    Congestive Heart Failure Father    Diabetes Mellitus II Sister    CAD Sister        s/p PCI - age 47.    ROS: [x]  Positive   [ ]  Negative   [ ]  All sytems reviewed and are negative 11 known uretic, ready when already Cardiovascular: []  chest pain/pressure []  palpitations []  SOB lying flat []  DOE []  pain in legs while walking []  pain in legs at rest []  pain in legs at night []  non-healing ulcers []  hx of DVT []  swelling in legs  Pulmonary: []  productive cough []  asthma/wheezing []  home O2  Neurologic: []  weakness in []  arms []  legs []  numbness in []  arms []  legs []  hx of CVA []   mini stroke [] difficulty speaking or slurred speech []  temporary loss of vision in one eye []  dizziness  Hematologic: []  hx of  cancer []  bleeding problems []  problems with blood clotting easily  Endocrine:   []  diabetes []  thyroid disease  GI []  vomiting blood []  blood in stool  GU: []  CKD/renal failure []  HD--[]  M/W/F or []  T/T/S []  burning with urination []  blood in urine  Psychiatric: []  anxiety []  depression  Musculoskeletal: []  arthritis []  joint pain  Integumentary: []  rashes []  ulcers  Constitutional: []  fever []  chills   Physical Examination  Vitals:   03/04/24 0725 03/04/24 0749  BP: (!) 106/52   Pulse: 78   Resp: 16   SpO2: 100% 99%   There is no height or weight on file to calculate BMI.  General:  WDWN in NAD Gait: Not observed HENT: WNL, normocephalic Pulmonary: normal non-labored breathing Cardiac: regular, without  Murmurs, rubs or gallops Abdomen:  soft, NT/ND Vascular Exam/Pulses: LIJ TDC   CBC    Component Value Date/Time   WBC 9.5 02/22/2024 2106   RBC 4.63 02/22/2024 2106   HGB 12.0 (L) 02/22/2024 2106   HCT 38.6 (L) 02/22/2024 2106   HCT 23.7 (L) 05/30/2016 1157   PLT 312 02/22/2024 2106   MCV 83.4 02/22/2024 2106   MCH 25.9 (L) 02/22/2024 2106   MCHC 31.1 02/22/2024 2106   RDW 19.0 (H) 02/22/2024 2106   LYMPHSABS 2.2 02/22/2024 2106   MONOABS 0.7 02/22/2024 2106   EOSABS 0.2 02/22/2024 2106   BASOSABS 0.1 02/22/2024 2106    BMET    Component Value Date/Time   NA 142 02/22/2024 2106   NA 141 10/15/2020 1015   K 5.4 (H) 02/22/2024 2106   CL 101 02/22/2024 2106   CO2 23 02/22/2024 2106   GLUCOSE 121 (H) 02/22/2024 2106   BUN 97 (H) 02/22/2024 2106   BUN 62 (H) 10/15/2020 1015   CREATININE 19.26 (H) 02/22/2024 2106   CALCIUM  9.5 02/22/2024 2106   GFRNONAA 3 (L) 02/22/2024 2106   GFRAA 9 (L) 10/15/2020 1015    COAGS: Lab Results  Component Value Date   INR 1.3 (H) 09/05/2023   INR 1.2 07/15/2022   INR 1.01 06/02/2016     Non-Invasive Vascular Imaging:      ASSESSMENT/PLAN: This is a 43 y.o. male  with ESRD that presents for  [facility] catheter exchange.  States this has not been working for the last several weeks since it was last exchanged.  Awaiting appointment with [facility] for new access placement.  Discussed option of placement in the groin given has had multiple issues with the catheter in his chest working.  He is against a thigh tunneled catheter at this time.  Risks and benefits discussed.  Lonni DOROTHA Gaskins, MD [facility] [place] Office: [phone]  Lonni JINNY Gaskins

## 2024-03-07 DIAGNOSIS — Z992 Dependence on renal dialysis: Secondary | ICD-10-CM | POA: Diagnosis not present

## 2024-03-07 DIAGNOSIS — N2581 Secondary hyperparathyroidism of renal origin: Secondary | ICD-10-CM | POA: Diagnosis not present

## 2024-03-07 DIAGNOSIS — E1129 Type 2 diabetes mellitus with other diabetic kidney complication: Secondary | ICD-10-CM | POA: Diagnosis not present

## 2024-03-07 DIAGNOSIS — T8249XA Other complication of vascular dialysis catheter, initial encounter: Secondary | ICD-10-CM | POA: Diagnosis not present

## 2024-03-07 DIAGNOSIS — D689 Coagulation defect, unspecified: Secondary | ICD-10-CM | POA: Diagnosis not present

## 2024-03-07 DIAGNOSIS — N186 End stage renal disease: Secondary | ICD-10-CM | POA: Diagnosis not present

## 2024-03-10 ENCOUNTER — Encounter (HOSPITAL_COMMUNITY): Payer: Self-pay | Admitting: *Deleted

## 2024-03-10 ENCOUNTER — Other Ambulatory Visit (HOSPITAL_COMMUNITY): Payer: Self-pay

## 2024-03-15 ENCOUNTER — Ambulatory Visit (HOSPITAL_COMMUNITY): Attending: Internal Medicine

## 2024-03-15 ENCOUNTER — Ambulatory Visit (HOSPITAL_COMMUNITY)

## 2024-03-16 ENCOUNTER — Other Ambulatory Visit (HOSPITAL_COMMUNITY): Payer: Self-pay

## 2024-03-16 ENCOUNTER — Encounter: Admitting: Surgery

## 2024-03-18 ENCOUNTER — Encounter: Payer: Self-pay | Admitting: Surgery

## 2024-03-18 ENCOUNTER — Ambulatory Visit: Admitting: Surgery

## 2024-03-21 DIAGNOSIS — R17 Unspecified jaundice: Secondary | ICD-10-CM | POA: Diagnosis not present

## 2024-03-21 DIAGNOSIS — E1129 Type 2 diabetes mellitus with other diabetic kidney complication: Secondary | ICD-10-CM | POA: Diagnosis not present

## 2024-03-21 DIAGNOSIS — D689 Coagulation defect, unspecified: Secondary | ICD-10-CM | POA: Diagnosis not present

## 2024-03-21 DIAGNOSIS — E440 Moderate protein-calorie malnutrition: Secondary | ICD-10-CM | POA: Diagnosis not present

## 2024-03-21 DIAGNOSIS — N2581 Secondary hyperparathyroidism of renal origin: Secondary | ICD-10-CM | POA: Diagnosis not present

## 2024-03-21 DIAGNOSIS — N186 End stage renal disease: Secondary | ICD-10-CM | POA: Diagnosis not present

## 2024-03-21 DIAGNOSIS — Z992 Dependence on renal dialysis: Secondary | ICD-10-CM | POA: Diagnosis not present

## 2024-03-21 DIAGNOSIS — D509 Iron deficiency anemia, unspecified: Secondary | ICD-10-CM | POA: Diagnosis not present

## 2024-03-21 DIAGNOSIS — D631 Anemia in chronic kidney disease: Secondary | ICD-10-CM | POA: Diagnosis not present

## 2024-03-21 DIAGNOSIS — N2589 Other disorders resulting from impaired renal tubular function: Secondary | ICD-10-CM | POA: Diagnosis not present

## 2024-03-21 DIAGNOSIS — T8249XA Other complication of vascular dialysis catheter, initial encounter: Secondary | ICD-10-CM | POA: Diagnosis not present

## 2024-03-21 DIAGNOSIS — E878 Other disorders of electrolyte and fluid balance, not elsewhere classified: Secondary | ICD-10-CM | POA: Diagnosis not present

## 2024-03-22 ENCOUNTER — Ambulatory Visit (HOSPITAL_COMMUNITY): Attending: Vascular Surgery

## 2024-03-22 ENCOUNTER — Ambulatory Visit (HOSPITAL_COMMUNITY): Admission: RE | Admit: 2024-03-22 | Source: Ambulatory Visit

## 2024-03-23 ENCOUNTER — Other Ambulatory Visit (HOSPITAL_COMMUNITY): Payer: Self-pay

## 2024-03-23 DIAGNOSIS — N2589 Other disorders resulting from impaired renal tubular function: Secondary | ICD-10-CM | POA: Diagnosis not present

## 2024-03-23 DIAGNOSIS — D509 Iron deficiency anemia, unspecified: Secondary | ICD-10-CM | POA: Diagnosis not present

## 2024-03-23 DIAGNOSIS — D631 Anemia in chronic kidney disease: Secondary | ICD-10-CM | POA: Diagnosis not present

## 2024-03-23 DIAGNOSIS — N2581 Secondary hyperparathyroidism of renal origin: Secondary | ICD-10-CM | POA: Diagnosis not present

## 2024-03-23 DIAGNOSIS — Z992 Dependence on renal dialysis: Secondary | ICD-10-CM | POA: Diagnosis not present

## 2024-03-23 DIAGNOSIS — E440 Moderate protein-calorie malnutrition: Secondary | ICD-10-CM | POA: Diagnosis not present

## 2024-03-23 DIAGNOSIS — E1129 Type 2 diabetes mellitus with other diabetic kidney complication: Secondary | ICD-10-CM | POA: Diagnosis not present

## 2024-03-23 DIAGNOSIS — N186 End stage renal disease: Secondary | ICD-10-CM | POA: Diagnosis not present

## 2024-03-23 DIAGNOSIS — D689 Coagulation defect, unspecified: Secondary | ICD-10-CM | POA: Diagnosis not present

## 2024-03-23 DIAGNOSIS — R17 Unspecified jaundice: Secondary | ICD-10-CM | POA: Diagnosis not present

## 2024-03-23 DIAGNOSIS — E878 Other disorders of electrolyte and fluid balance, not elsewhere classified: Secondary | ICD-10-CM | POA: Diagnosis not present

## 2024-03-23 DIAGNOSIS — T8249XA Other complication of vascular dialysis catheter, initial encounter: Secondary | ICD-10-CM | POA: Diagnosis not present

## 2024-03-23 MED ORDER — CARVEDILOL 25 MG PO TABS
25.0000 mg | ORAL_TABLET | Freq: Two times a day (BID) | ORAL | 4 refills | Status: DC
  Filled 2024-03-23: qty 180, 90d supply, fill #0

## 2024-03-25 ENCOUNTER — Encounter (HOSPITAL_COMMUNITY): Payer: Self-pay | Admitting: *Deleted

## 2024-03-25 ENCOUNTER — Other Ambulatory Visit (HOSPITAL_COMMUNITY): Payer: Self-pay

## 2024-03-25 DIAGNOSIS — E440 Moderate protein-calorie malnutrition: Secondary | ICD-10-CM | POA: Diagnosis not present

## 2024-03-25 DIAGNOSIS — N186 End stage renal disease: Secondary | ICD-10-CM | POA: Diagnosis not present

## 2024-03-25 DIAGNOSIS — R17 Unspecified jaundice: Secondary | ICD-10-CM | POA: Diagnosis not present

## 2024-03-25 DIAGNOSIS — D689 Coagulation defect, unspecified: Secondary | ICD-10-CM | POA: Diagnosis not present

## 2024-03-25 DIAGNOSIS — E1129 Type 2 diabetes mellitus with other diabetic kidney complication: Secondary | ICD-10-CM | POA: Diagnosis not present

## 2024-03-25 DIAGNOSIS — E878 Other disorders of electrolyte and fluid balance, not elsewhere classified: Secondary | ICD-10-CM | POA: Diagnosis not present

## 2024-03-25 DIAGNOSIS — T8249XA Other complication of vascular dialysis catheter, initial encounter: Secondary | ICD-10-CM | POA: Diagnosis not present

## 2024-03-25 DIAGNOSIS — Z992 Dependence on renal dialysis: Secondary | ICD-10-CM | POA: Diagnosis not present

## 2024-03-25 DIAGNOSIS — D509 Iron deficiency anemia, unspecified: Secondary | ICD-10-CM | POA: Diagnosis not present

## 2024-03-25 DIAGNOSIS — N2581 Secondary hyperparathyroidism of renal origin: Secondary | ICD-10-CM | POA: Diagnosis not present

## 2024-03-25 DIAGNOSIS — D631 Anemia in chronic kidney disease: Secondary | ICD-10-CM | POA: Diagnosis not present

## 2024-03-25 DIAGNOSIS — N2589 Other disorders resulting from impaired renal tubular function: Secondary | ICD-10-CM | POA: Diagnosis not present

## 2024-03-28 DIAGNOSIS — N2581 Secondary hyperparathyroidism of renal origin: Secondary | ICD-10-CM | POA: Diagnosis not present

## 2024-03-28 DIAGNOSIS — D509 Iron deficiency anemia, unspecified: Secondary | ICD-10-CM | POA: Diagnosis not present

## 2024-03-28 DIAGNOSIS — N186 End stage renal disease: Secondary | ICD-10-CM | POA: Diagnosis not present

## 2024-03-28 DIAGNOSIS — T8249XA Other complication of vascular dialysis catheter, initial encounter: Secondary | ICD-10-CM | POA: Diagnosis not present

## 2024-03-28 DIAGNOSIS — Z992 Dependence on renal dialysis: Secondary | ICD-10-CM | POA: Diagnosis not present

## 2024-03-28 DIAGNOSIS — D689 Coagulation defect, unspecified: Secondary | ICD-10-CM | POA: Diagnosis not present

## 2024-03-30 DIAGNOSIS — N2581 Secondary hyperparathyroidism of renal origin: Secondary | ICD-10-CM | POA: Diagnosis not present

## 2024-03-30 DIAGNOSIS — N186 End stage renal disease: Secondary | ICD-10-CM | POA: Diagnosis not present

## 2024-03-30 DIAGNOSIS — T8249XA Other complication of vascular dialysis catheter, initial encounter: Secondary | ICD-10-CM | POA: Diagnosis not present

## 2024-03-30 DIAGNOSIS — Z992 Dependence on renal dialysis: Secondary | ICD-10-CM | POA: Diagnosis not present

## 2024-03-30 DIAGNOSIS — D689 Coagulation defect, unspecified: Secondary | ICD-10-CM | POA: Diagnosis not present

## 2024-03-30 DIAGNOSIS — D509 Iron deficiency anemia, unspecified: Secondary | ICD-10-CM | POA: Diagnosis not present

## 2024-04-01 DIAGNOSIS — D689 Coagulation defect, unspecified: Secondary | ICD-10-CM | POA: Diagnosis not present

## 2024-04-01 DIAGNOSIS — T8249XA Other complication of vascular dialysis catheter, initial encounter: Secondary | ICD-10-CM | POA: Diagnosis not present

## 2024-04-01 DIAGNOSIS — Z992 Dependence on renal dialysis: Secondary | ICD-10-CM | POA: Diagnosis not present

## 2024-04-01 DIAGNOSIS — N186 End stage renal disease: Secondary | ICD-10-CM | POA: Diagnosis not present

## 2024-04-01 DIAGNOSIS — N2581 Secondary hyperparathyroidism of renal origin: Secondary | ICD-10-CM | POA: Diagnosis not present

## 2024-04-01 DIAGNOSIS — D509 Iron deficiency anemia, unspecified: Secondary | ICD-10-CM | POA: Diagnosis not present

## 2024-04-04 ENCOUNTER — Telehealth (HOSPITAL_COMMUNITY): Payer: Self-pay

## 2024-04-04 NOTE — Progress Notes (Signed)
 HF MD: Dr Rolan ID: Dr Luiz Vascular: Dr Sheree   HPI: [name] is a 43 y.o. male with history of NICM, HTN, DMII, AI, ESRD.  History of aortic valve endocarditis in 2023 treated with IV antibiotics.   Cardiac cath 2016 showed no significant CAD.  Echo in 5/16 showed EF 20-25%.  By 4/17, echo showed EF up to 50%.   Admitted 11/23 with E faecalis bacteremia, source felt to be right IJ dialysis catheter. Echo showed EF 50-55%, grade II DD, RV norma, moderate AI with ? vegetation. TEE showed normal LV function, borderline severe AI, concern for AoV leaflet perforation from infective endocarditis. ID consulted, he was started on IV ampicillin /ceftriaxone  and his dialysis catheter was removed. CVTS consulted and recommended AVR. Planned to be cleared by dental surgery before considering AVR. Dental surgeon saw him and made recommendation to remove at least 8 teeth. CTA showed no evidence of CAD, coronary calcium  score of 0. After his line holiday, tunneled catheter placed for dialysis. He was then discharged home, with plans to follow up with TCTS after teeth extraction. However, he was lost to followup and did not come back to cardiology clinic or TCTS clinic.    Re-admitted 1/24 with acute dyspnea after missing HD. Placed on BiPap and IV nitro gtt. Had urgent dialysis with resolution of symptoms.  Admitted 10/26/23 with hypoxic respiratory failure, flash pulmonary edema. Echo showed EF 35-40%, severe AI. TEE showed EF 35%, severe aortic regurgitation with mobile vegetation that appeared calcified and old. ID consulted.   Multiple teeth extracted. Also treated for osteomyelitis of the 3rd metatarsal left foot. He has now completed antibiotics for osteomyelitis.   R/LHC showed normal cors, elevated PCWP but normal PA pressures, mild pulmonary hypertension, low CI 1.9 (Fick), 2.1 (TD), suspect due to valvular disease.   Today he presents for an acute visit with complaints of right testicular  swelling, started after his cath.  No fever, bleeding or drainage. Just bothersome. Says he has had boils in groin area in the past, treated with I&D and given doxycycline . Asking for antibiotic today.  ECG (personally reviewed): none ordered today.  Labs (2/25): LDL 83 Labs (4/25): hgb 14  PMH: 1. Type II diabetes since 2006.  2. HTN since around 2010.   3. Asthma 4. Hyperlipidemia 5. Morbid obesity 6. Cardiomyopathy: Nonischemic.  Echo (5/16) with EF 20-25%, diffuse hypokinesis, grade II diastolic dysfunction.  Lexiscan  Cardiolite  (5/16) with EF 28%, inferior/inferolateral/apical ischemia.  RHC/LHC (5/16) with normal coronaries, mean RA 11, PA 33/21 mean 28, mean PCWP 18, CI 2.53.  - Echo (4/17): EF 50%, moderate LVH, normal RV size and systolic function. Grade IDD  - Echo (9/17): EF 55%.  - Echo (5/19): EF 50-55%, moderate LVH, normal RV size and systolic function.  - Echo (10/20): EF 55-60%, normal RV.  - Echo (11/23): EF 50-55%, grade II DD, normal RV, moderate AI w/ ? Vegetation - TEE (2/25): EF 35%, global hypokinesis, mild RV dysfunction, severe AI, aortic valve with mobile calcified vegetations on aortic valve.  - R/LHC 5/25: normal cors, RA 6, PA 46/21 (30), PCWP 20, CO/CI (Fick) 4.7/1.9, PVR 2.12 WU, PAPi 4.1 7. ABIs normal 8/16.  8. ESRD: AKI in 9/17 with diagnosis of nephrotic syndrome. 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  9. COVID-19 infection 2/21.  10. Aortic valve endocarditis: TEE (11/23) showed EF 55-60%, normal RV, no LAA, + SVC thrombus, borderline severe AI with concern for AoV leaflet perforation 2/2 infective endocarditis.  -  TEE (2/25) with EF 35%, global hypokinesis, mild RV dysfunction, severe AI, aortic valve with mobile calcified vegetations on aortic valve.  11. 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   ROS: All systems negative except as listed in HPI, PMH and Problem List.  SH:  Social  History   Socioeconomic History   Marital status: Single    Spouse name: Not on file   Number of children: 2   Years of education: Not on file   Highest education level: Some college, no degree  Occupational History   Not on file  Tobacco Use   Smoking status: Never   Smokeless tobacco: Never  Vaping Use   Vaping status: Never Used  Substance and Sexual Activity   Alcohol use: No    Alcohol/week: 0.0 standard drinks of alcohol   Drug use: Yes    Types: Marijuana    Comment: last used 09/15/2020   Sexual activity: Yes    Partners: Female  Other Topics Concern   Not on file  Social History Narrative   Lives in [place] by himself.  Currently in [employer].   Caffeine- none       Social Drivers of Corporate investment banker Strain: Not on file  Food Insecurity: No Food Insecurity (10/27/2023)   Hunger Vital Sign    Worried About Running Out of Food in the Last Year: Never true    Ran Out of Food in the Last Year: Never true  Transportation Needs: No Transportation Needs (10/27/2023)   PRAPARE - Administrator, Civil Service (Medical): No    Lack of Transportation (Non-Medical): No  Physical Activity: Insufficiently Active (11/27/2021)   Exercise Vital Sign    Days of Exercise per Week: 2 days    Minutes of Exercise per Session: 30 min  Stress: Stress Concern Present (11/27/2021)   [employer] of Occupational Health - Occupational Stress Questionnaire    Feeling of Stress : To some extent  Social Connections: Unknown (02/17/2023)   Received from [facility]   Social Network    Social Network: Not on file  Intimate Partner Violence: Not At Risk (10/27/2023)   Humiliation, Afraid, Rape, and Kick questionnaire    Fear of Current or Ex-Partner: No    Emotionally Abused: No    Physically Abused: No    Sexually Abused: No    FH:  Family History  Problem Relation Age of Onset   Diabetes Mellitus II Mother        died @ 54.   Gastric cancer Mother     CAD Father        died @ 74.   Heart attack Father    Congestive Heart Failure Father    Diabetes Mellitus II Sister    CAD Sister        s/p PCI - age 40.   Current Outpatient Medications  Medication Sig Dispense Refill   Accu-Chek Softclix Lancets lancets Use as directed four times daily. 100 each 0   acetaminophen  (TYLENOL ) 500 MG tablet Take 1,000 mg by mouth every 6 (six) hours as needed for moderate pain.     atorvastatin  (LIPITOR ) 80 MG tablet Take 1 tablet (80 mg total) by mouth daily. 90 tablet 3   blood glucose meter kit and supplies KIT Use to check blood sugar up to four times daily as directed. 1 each 0   carvedilol  (COREG ) 25 MG tablet Take 2 tablets (50 mg total) by mouth 2 (  two) times daily. 120 tablet 11   carvedilol  (COREG ) 25 MG tablet Take 1 tablet by mouth twice a day with meals 180 tablet 4   glucose blood (ACCU-CHEK GUIDE TEST) test strip Use as directed four times daily. 100 strip 0   hydrALAZINE  (APRESOLINE ) 50 MG tablet Take 1 tablet by mouth 3 times a day 270 tablet 3   insulin  aspart (NOVOLOG  FLEXPEN) 100 UNIT/ML FlexPen Inject 15 Units into the skin 3 (three) times daily as needed for high blood sugar. 12 mL 3   insulin  glargine-yfgn (SEMGLEE ) 100 UNIT/ML Pen Inject 15 Units into the skin daily. (Patient taking differently: Inject 15 Units into the skin every evening.) 15 mL 1   methocarbamol  (ROBAXIN ) 500 MG tablet Take 1 tablet (500 mg total) by mouth daily as needed. 30 tablet 11   multivitamin (RENA-VIT) TABS tablet Take 1 tablet by mouth daily.     nitroGLYCERIN  (NITROSTAT ) 0.4 MG SL tablet Place 1 tablet (0.4 mg total) under the tongue every 5 (five) minutes x 3 doses as needed within 15 minutes. If chest pain does not resolve, seek medical attention. 25 tablet 3   ondansetron  (ZOFRAN ) 4 MG tablet Take 1 tablet (4 mg total) by mouth daily as needed. 90 tablet 3   pantoprazole  (PROTONIX ) 20 MG tablet Take 1 tablet (20 mg total) by mouth daily as directed for  reflux. 30 tablet 11   pantoprazole  (PROTONIX ) 40 MG tablet Take 1 tablet (40 mg total) by mouth daily. 30 tablet 1   sevelamer  carbonate (RENVELA ) 800 MG tablet Take 3 tablets (2,400 mg total) by mouth 3 (three) times daily with meals 810 tablet 3   Tenapanor  HCl, CKD, (XPHOZAH ) 30 MG TABS Take 1 tablet by mouth twice a day as directed Take 1 in the morning and 1 at night 180 tablet 3   No current facility-administered medications for this visit.   Wt Readings from Last 3 Encounters:  02/22/24 115.7 kg (255 lb)  02/09/24 115.6 kg (254 lb 12.8 oz)  02/02/24 114.8 kg (253 lb)   There were no vitals taken for this visit.  PHYSICAL EXAM: General:  NAD. No resp difficulty, walked into clinic HEENT: Normal Neck: Supple. No JVD. Cor: Regular rate & rhythm. No rubs, gallops or murmurs. Lungs: Clear Abdomen: Soft, nontender, nondistended.  Extremities: No cyanosis, clubbing, rash, edema Neuro: Alert & oriented x 3, moves all 4 extremities w/o difficulty. Affect pleasant. GU: unilateral swelling to right testicle, no ecchymosis or drainage.   ASSESSMENT & PLAN: 1. Right groin swelling: concern for pseudoaneurysm post cath. No palpable bruit or thrill. Cath site soft, pedal pulses palpable, no ecchymosis to R flank or groin. Suspect he had a small hematoma from cath that migrated to right testicle. Will likely take weeks to resolve. - Out of abundance of caution, will arrange groin u/s to rule out pseudoaneurysm. Will get CBC to ensure hgb stable. - Patient upset he is not receiving abx therapy today, and now adamant he is leaving and going to the ED. I asked patient to reconsider, but I do not believe he needs abx at this time.  Harlene Gainer, FNP-BC 04/04/24

## 2024-04-04 NOTE — Telephone Encounter (Signed)
 Called to confirm/remind patient of their appointment at the [facility] on 04/05/24.   Appointment:   [x] Confirmed  [] Left mess   [] No answer/No voice mail  [] VM Full/unable to leave message  [] Phone not in service  Patient reminded to bring all medications and/or complete list.  Confirmed patient has transportation. Gave directions, instructed to utilize valet parking.

## 2024-04-05 ENCOUNTER — Ambulatory Visit (HOSPITAL_COMMUNITY)
Admission: RE | Admit: 2024-04-05 | Discharge: 2024-04-05 | Disposition: A | Discharge status: Home / Self Care | Source: Ambulatory Visit | Attending: Family Medicine | Admitting: Family Medicine

## 2024-04-05 ENCOUNTER — Encounter (HOSPITAL_COMMUNITY): Payer: Self-pay

## 2024-04-05 ENCOUNTER — Ambulatory Visit (HOSPITAL_COMMUNITY): Payer: Self-pay | Admitting: Family Medicine

## 2024-04-05 VITALS — BP 128/68 | HR 85 | Ht 77.0 in | Wt 252.6 lb

## 2024-04-05 DIAGNOSIS — Z992 Dependence on renal dialysis: Secondary | ICD-10-CM | POA: Insufficient documentation

## 2024-04-05 DIAGNOSIS — I351 Nonrheumatic aortic (valve) insufficiency: Secondary | ICD-10-CM | POA: Diagnosis not present

## 2024-04-05 DIAGNOSIS — Z794 Long term (current) use of insulin: Secondary | ICD-10-CM | POA: Diagnosis not present

## 2024-04-05 DIAGNOSIS — I428 Other cardiomyopathies: Secondary | ICD-10-CM | POA: Insufficient documentation

## 2024-04-05 DIAGNOSIS — I491 Atrial premature depolarization: Secondary | ICD-10-CM | POA: Diagnosis not present

## 2024-04-05 DIAGNOSIS — I10 Essential (primary) hypertension: Secondary | ICD-10-CM | POA: Diagnosis not present

## 2024-04-05 DIAGNOSIS — Z79899 Other long term (current) drug therapy: Secondary | ICD-10-CM | POA: Diagnosis not present

## 2024-04-05 DIAGNOSIS — I132 Hypertensive heart and chronic kidney disease with heart failure and with stage 5 chronic kidney disease, or end stage renal disease: Secondary | ICD-10-CM | POA: Insufficient documentation

## 2024-04-05 DIAGNOSIS — N186 End stage renal disease: Secondary | ICD-10-CM | POA: Insufficient documentation

## 2024-04-05 DIAGNOSIS — I5022 Chronic systolic (congestive) heart failure: Secondary | ICD-10-CM | POA: Insufficient documentation

## 2024-04-05 DIAGNOSIS — E1122 Type 2 diabetes mellitus with diabetic chronic kidney disease: Secondary | ICD-10-CM | POA: Diagnosis not present

## 2024-04-05 DIAGNOSIS — I5042 Chronic combined systolic (congestive) and diastolic (congestive) heart failure: Secondary | ICD-10-CM

## 2024-04-05 NOTE — Patient Instructions (Addendum)
 Thank you for coming in today  If you had labs drawn today, any labs that are abnormal the clinic will call you No news is good news  Please call [facility] to reschedule appointment [phone]  Medications: No changes  Follow up appointments:  Your physician recommends that you schedule a follow-up appointment in:  4 month With Dr. Rolan Please call our office to schedule the follow-up appointment in October for December 2025.    Do the following things EVERYDAY: Weigh yourself in the morning before breakfast. Write it down and keep it in a log. Take your medicines as prescribed Eat low salt foods--Limit salt (sodium) to 2000 mg per day.  Stay as active as you can everyday Limit all fluids for the day to less than 2 liters   At the [facility], you and your health needs are our priority. As part of our continuing mission to provide you with exceptional heart care, we have created designated Provider Care Teams. These Care Teams include your primary Cardiologist (physician) and Advanced Practice Providers (APPs- Physician Assistants and Nurse Practitioners) who all work together to provide you with the care you need, when you need it.   You may see any of the following providers on your designated Care Team at your next follow up: Dr Toribio Fuel Dr Ezra Rolan Dr. Ria Gardenia Greig Lenetta, NP Caffie Shed, [place] [facility] [place], [place] Beckey Coe, NP Tinnie Redman, PharmD   Please be sure to bring in all your medications bottles to every appointment.    Thank you for choosing [facility]  If you have any questions or concerns before your next appointment please send us  a message through Ivan or call our office at [phone].    TO LEAVE A MESSAGE FOR THE NURSE SELECT OPTION 2, PLEASE LEAVE A MESSAGE INCLUDING: YOUR NAME DATE OF BIRTH CALL BACK NUMBER REASON FOR  CALL**this is important as we prioritize the call backs  YOU WILL RECEIVE A CALL BACK THE SAME DAY AS LONG AS YOU CALL BEFORE 4:00 PM

## 2024-04-05 NOTE — Progress Notes (Signed)
 ReDS Vest / Clip - 04/05/24 1009       ReDS Vest / Clip   Station Marker D    Ruler Value 37    ReDS Value Range High volume overload    ReDS Actual Value 51

## 2024-04-07 ENCOUNTER — Telehealth: Payer: Self-pay

## 2024-04-07 DIAGNOSIS — N186 End stage renal disease: Secondary | ICD-10-CM | POA: Diagnosis not present

## 2024-04-07 DIAGNOSIS — Z992 Dependence on renal dialysis: Secondary | ICD-10-CM | POA: Diagnosis not present

## 2024-04-07 DIAGNOSIS — E1129 Type 2 diabetes mellitus with other diabetic kidney complication: Secondary | ICD-10-CM | POA: Diagnosis not present

## 2024-04-07 NOTE — Progress Notes (Signed)
 Complex Care Management Note Care Guide Note  04/07/2024 Name: Jeremy [name] MRN: [id number] DOB: 01-03-1981   Complex Care Management Outreach Attempts: An unsuccessful telephone outreach was attempted today to offer the patient information about available complex care management services.  Follow Up Plan:  Additional outreach attempts will be made to offer the patient complex care management information and services.   Encounter Outcome:  No Answer  Dreama [facility]  [facility], [facility] Health Care Management Assistant Direct Dial: [phone]  Fax: [phone]

## 2024-04-11 DIAGNOSIS — D509 Iron deficiency anemia, unspecified: Secondary | ICD-10-CM | POA: Diagnosis not present

## 2024-04-11 DIAGNOSIS — D689 Coagulation defect, unspecified: Secondary | ICD-10-CM | POA: Diagnosis not present

## 2024-04-11 DIAGNOSIS — N186 End stage renal disease: Secondary | ICD-10-CM | POA: Diagnosis not present

## 2024-04-11 DIAGNOSIS — Z992 Dependence on renal dialysis: Secondary | ICD-10-CM | POA: Diagnosis not present

## 2024-04-11 DIAGNOSIS — T8249XA Other complication of vascular dialysis catheter, initial encounter: Secondary | ICD-10-CM | POA: Diagnosis not present

## 2024-04-11 DIAGNOSIS — N2581 Secondary hyperparathyroidism of renal origin: Secondary | ICD-10-CM | POA: Diagnosis not present

## 2024-04-13 ENCOUNTER — Other Ambulatory Visit (HOSPITAL_COMMUNITY): Payer: Self-pay

## 2024-04-13 ENCOUNTER — Ambulatory Visit: Attending: Vascular Surgery | Admitting: Vascular Surgery

## 2024-04-13 DIAGNOSIS — N186 End stage renal disease: Secondary | ICD-10-CM | POA: Diagnosis not present

## 2024-04-13 DIAGNOSIS — T8249XA Other complication of vascular dialysis catheter, initial encounter: Secondary | ICD-10-CM | POA: Diagnosis not present

## 2024-04-13 DIAGNOSIS — D509 Iron deficiency anemia, unspecified: Secondary | ICD-10-CM | POA: Diagnosis not present

## 2024-04-13 DIAGNOSIS — N2581 Secondary hyperparathyroidism of renal origin: Secondary | ICD-10-CM | POA: Diagnosis not present

## 2024-04-13 DIAGNOSIS — D689 Coagulation defect, unspecified: Secondary | ICD-10-CM | POA: Diagnosis not present

## 2024-04-13 DIAGNOSIS — Z992 Dependence on renal dialysis: Secondary | ICD-10-CM | POA: Diagnosis not present

## 2024-04-13 MED ORDER — DOXYCYCLINE HYCLATE 100 MG PO TABS
100.0000 mg | ORAL_TABLET | Freq: Two times a day (BID) | ORAL | 0 refills | Status: DC
  Filled 2024-04-13: qty 14, 7d supply, fill #0

## 2024-04-14 NOTE — Progress Notes (Signed)
 Complex Care Management Note  Care Guide Note 04/14/2024 Name: Jeremy [name] MRN: [id number] DOB: October 03, 1980  Jeremy [name] is a 43 y.o. year old male who sees D'Mello, Rosalyn, DO for primary care. I reached out to Dorn Alert by phone today to offer complex care management services.  Mr. Langdon was given information about Complex Care Management services today including:   The Complex Care Management services include support from the care team which includes your Nurse Care Manager, Clinical Social Worker, or Pharmacist.  The Complex Care Management team is here to help remove barriers to the health concerns and goals most important to you. Complex Care Management services are voluntary, and the patient may decline or stop services at any time by request to their care team member.   Complex Care Management Consent Status: Patient did not agree to participate in complex care management services at this time.  Encounter Outcome:  Patient Refused  Dreama Agent [facility], [facility] Health Care Management Assistant Direct Dial: [phone]  Fax: [phone]

## 2024-04-16 DIAGNOSIS — N2581 Secondary hyperparathyroidism of renal origin: Secondary | ICD-10-CM | POA: Diagnosis not present

## 2024-04-16 DIAGNOSIS — T8249XA Other complication of vascular dialysis catheter, initial encounter: Secondary | ICD-10-CM | POA: Diagnosis not present

## 2024-04-16 DIAGNOSIS — N186 End stage renal disease: Secondary | ICD-10-CM | POA: Diagnosis not present

## 2024-04-16 DIAGNOSIS — Z992 Dependence on renal dialysis: Secondary | ICD-10-CM | POA: Diagnosis not present

## 2024-04-16 DIAGNOSIS — D509 Iron deficiency anemia, unspecified: Secondary | ICD-10-CM | POA: Diagnosis not present

## 2024-04-16 DIAGNOSIS — D689 Coagulation defect, unspecified: Secondary | ICD-10-CM | POA: Diagnosis not present

## 2024-04-18 DIAGNOSIS — D509 Iron deficiency anemia, unspecified: Secondary | ICD-10-CM | POA: Diagnosis not present

## 2024-04-18 DIAGNOSIS — T8249XA Other complication of vascular dialysis catheter, initial encounter: Secondary | ICD-10-CM | POA: Diagnosis not present

## 2024-04-18 DIAGNOSIS — D689 Coagulation defect, unspecified: Secondary | ICD-10-CM | POA: Diagnosis not present

## 2024-04-18 DIAGNOSIS — N2581 Secondary hyperparathyroidism of renal origin: Secondary | ICD-10-CM | POA: Diagnosis not present

## 2024-04-18 DIAGNOSIS — N186 End stage renal disease: Secondary | ICD-10-CM | POA: Diagnosis not present

## 2024-04-18 DIAGNOSIS — Z992 Dependence on renal dialysis: Secondary | ICD-10-CM | POA: Diagnosis not present

## 2024-04-20 DIAGNOSIS — D509 Iron deficiency anemia, unspecified: Secondary | ICD-10-CM | POA: Diagnosis not present

## 2024-04-20 DIAGNOSIS — Z992 Dependence on renal dialysis: Secondary | ICD-10-CM | POA: Diagnosis not present

## 2024-04-20 DIAGNOSIS — T8249XA Other complication of vascular dialysis catheter, initial encounter: Secondary | ICD-10-CM | POA: Diagnosis not present

## 2024-04-20 DIAGNOSIS — D689 Coagulation defect, unspecified: Secondary | ICD-10-CM | POA: Diagnosis not present

## 2024-04-20 DIAGNOSIS — N2581 Secondary hyperparathyroidism of renal origin: Secondary | ICD-10-CM | POA: Diagnosis not present

## 2024-04-20 DIAGNOSIS — N186 End stage renal disease: Secondary | ICD-10-CM | POA: Diagnosis not present

## 2024-04-22 ENCOUNTER — Other Ambulatory Visit (HOSPITAL_COMMUNITY): Payer: Self-pay

## 2024-04-22 DIAGNOSIS — T8249XA Other complication of vascular dialysis catheter, initial encounter: Secondary | ICD-10-CM | POA: Diagnosis not present

## 2024-04-22 DIAGNOSIS — D689 Coagulation defect, unspecified: Secondary | ICD-10-CM | POA: Diagnosis not present

## 2024-04-22 DIAGNOSIS — D509 Iron deficiency anemia, unspecified: Secondary | ICD-10-CM | POA: Diagnosis not present

## 2024-04-22 DIAGNOSIS — N186 End stage renal disease: Secondary | ICD-10-CM | POA: Diagnosis not present

## 2024-04-22 DIAGNOSIS — N2581 Secondary hyperparathyroidism of renal origin: Secondary | ICD-10-CM | POA: Diagnosis not present

## 2024-04-22 DIAGNOSIS — Z992 Dependence on renal dialysis: Secondary | ICD-10-CM | POA: Diagnosis not present

## 2024-04-24 ENCOUNTER — Encounter: Admitting: Nurse Practitioner

## 2024-04-24 NOTE — Progress Notes (Signed)
 Arrived 14 minutes prior to scheduled time. Logged off shortly thereafter prior to me being able to start visit and did not return

## 2024-05-08 DIAGNOSIS — Z992 Dependence on renal dialysis: Secondary | ICD-10-CM | POA: Diagnosis not present

## 2024-05-08 DIAGNOSIS — N186 End stage renal disease: Secondary | ICD-10-CM | POA: Diagnosis not present

## 2024-05-08 DIAGNOSIS — E1129 Type 2 diabetes mellitus with other diabetic kidney complication: Secondary | ICD-10-CM | POA: Diagnosis not present

## 2024-05-09 DIAGNOSIS — N2581 Secondary hyperparathyroidism of renal origin: Secondary | ICD-10-CM | POA: Diagnosis not present

## 2024-05-09 DIAGNOSIS — N186 End stage renal disease: Secondary | ICD-10-CM | POA: Diagnosis not present

## 2024-05-09 DIAGNOSIS — D509 Iron deficiency anemia, unspecified: Secondary | ICD-10-CM | POA: Diagnosis not present

## 2024-05-09 DIAGNOSIS — T8249XA Other complication of vascular dialysis catheter, initial encounter: Secondary | ICD-10-CM | POA: Diagnosis not present

## 2024-05-09 DIAGNOSIS — D689 Coagulation defect, unspecified: Secondary | ICD-10-CM | POA: Diagnosis not present

## 2024-05-09 DIAGNOSIS — D631 Anemia in chronic kidney disease: Secondary | ICD-10-CM | POA: Diagnosis not present

## 2024-05-09 DIAGNOSIS — Z992 Dependence on renal dialysis: Secondary | ICD-10-CM | POA: Diagnosis not present

## 2024-05-10 ENCOUNTER — Ambulatory Visit: Admitting: Podiatry

## 2024-05-10 ENCOUNTER — Encounter: Payer: Self-pay | Admitting: Podiatry

## 2024-05-10 ENCOUNTER — Ambulatory Visit (INDEPENDENT_AMBULATORY_CARE_PROVIDER_SITE_OTHER)

## 2024-05-10 DIAGNOSIS — L97524 Non-pressure chronic ulcer of other part of left foot with necrosis of bone: Secondary | ICD-10-CM

## 2024-05-10 DIAGNOSIS — M86172 Other acute osteomyelitis, left ankle and foot: Secondary | ICD-10-CM

## 2024-05-10 NOTE — Progress Notes (Signed)
 Subjective:  Patient ID: Jeremy [name], male    DOB: 1981-01-15,  MRN: [id number]  Chief Complaint  Patient presents with   Foot Ulcer    Left foot plantar ulcer. Claening with peroxide and iodine. Taking antibiotics lkeft over from previouse surgery. 10 pain. IDDM A1C 8. 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    43 y.o. male presents with concern for ulceration of the left plantar foot.  I last saw him in November 24, 2023.  He had undergone I&D of surgical wound with prep for graft and closure with dermal allograft and antibiotic beads on October 30, 2023.  He had partial third ray resection and partial amputation fourth proximal phalanx.  At the time he was healing and I recommended continued follow-up in 2 weeks for wound check with dressing change with Iodosorb or Betadine in the surgical site.  He did not follow-up after that until today.  He states that the ulceration recently opened up significantly and had some more drainage and foul smell.  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 01/2015 Lexiscan  MV: EF 28%, inferior, inferolateral, apical ischemia;  b. 01/2015 Cath: nl cors, PCWP 18 mmHg, CO 9.38 L/min, CI 3.53 L/min/m^2.   CHF (congestive heart failure) (HCC)    Depression    Situational   ESRD (end stage renal disease) (HCC)    [facility]- [place] [facility]   Essential hypertension    Family history of adverse reaction to anesthesia    sister- it was too much for her heart died   Flash pulmonary edema (HCC) 09/18/2022   GERD (gastroesophageal reflux disease)    Hyperlipidemia    Membranous glomerulonephritis    Morbid obesity (HCC)    Nonischemic cardiomyopathy (HCC)    a. 01/2015 Echo: EF 20-25%, diff HK, Gr 2 DD, Triv AI, mildly dil LA and Ao root.   PONV (postoperative nausea and vomiting)    Seizures (HCC) 02/29/2020    electralytes were out of wack.   Type II diabetes mellitus (HCC)    a. 01/2015 HbA1c = 8.9.    Allergies  Allergen Reactions   Acyclovir And Related Hives   Imdur  [Isosorbide  Nitrate]     Headaches from nitrate    ROS: Negative except as per HPI above  Objective:  General: AAO x3, NAD  Dermatological: Attention directed to the plantar left forefoot there is circular ulceration underlying the second metatarsal head area.  There is necrotic and fibrotic tissue present in the wound bed and fibropurulent drainage.  Significant malodor.  Wound does probe down to the second metatarsal head as well as exposed flexor tendons.    Vascular: DP and PT pulses are palpable 2+  Neruologic: Grossly diminished via light touch  Musculoskeletal: Prior third toe amputation and partial fourth toe amputation  No images are attached to the encounter.  Radiographs:  Date: 05/10/2024 XR the left foot Weightbearing AP/Lateral/Oblique   Findings: On oblique view question area of osteolysis of the plantar aspect of the second metatarsal head difficult to determine with the overlap of soft tissues.  No lateral is present. Assessment:   1. Acute osteomyelitis of left ankle or foot (HCC)   2. 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  # Ulceration of the left plantar forefoot with concern for underlying osteomyelitis of the second - Given the acute worsening of the ulceration in addition to positive probe to second metatarsal head I am highly suspicious of osteomyelitis of the left forefoot. - X-ray concerning for possible erosion second metatarsal head plantarly on oblique view. - I discussed the patient I recommend admission to [facility] will try for direct admission.  Upon arrival he would need MRI of the left foot with likely plans for operative intervention tomorrow.  N.p.o. past midnight.  I did discuss that  he is facing transmetatarsal amputation on the left foot given the amount of bone loss he already has however we may be able to proceed with more limited resection of the second MPJ however this will depend on MRI results. - Betadine wet-to-dry dressing applied leave this clean dry and intact - Also recommend broad-spectrum IV antibiotics upon admission          [name], DPM [facility]

## 2024-05-11 ENCOUNTER — Inpatient Hospital Stay (HOSPITAL_COMMUNITY)
Admission: EM | Admit: 2024-05-11 | Discharge: 2024-05-13 | DRG: 617 | Disposition: A | Discharge status: Home / Self Care | Source: Ambulatory Visit | Attending: Family Medicine | Admitting: Family Medicine

## 2024-05-11 ENCOUNTER — Emergency Department (HOSPITAL_COMMUNITY)

## 2024-05-11 ENCOUNTER — Ambulatory Visit: Admitting: Surgery

## 2024-05-11 ENCOUNTER — Other Ambulatory Visit: Payer: Self-pay

## 2024-05-11 ENCOUNTER — Encounter (HOSPITAL_COMMUNITY): Payer: Self-pay | Admitting: Emergency Medicine

## 2024-05-11 DIAGNOSIS — E1122 Type 2 diabetes mellitus with diabetic chronic kidney disease: Secondary | ICD-10-CM | POA: Diagnosis not present

## 2024-05-11 DIAGNOSIS — M869 Osteomyelitis, unspecified: Secondary | ICD-10-CM | POA: Diagnosis present

## 2024-05-11 DIAGNOSIS — Z794 Long term (current) use of insulin: Secondary | ICD-10-CM

## 2024-05-11 DIAGNOSIS — R569 Unspecified convulsions: Secondary | ICD-10-CM | POA: Diagnosis present

## 2024-05-11 DIAGNOSIS — Z1152 Encounter for screening for COVID-19: Secondary | ICD-10-CM

## 2024-05-11 DIAGNOSIS — Z888 Allergy status to other drugs, medicaments and biological substances status: Secondary | ICD-10-CM

## 2024-05-11 DIAGNOSIS — D509 Iron deficiency anemia, unspecified: Secondary | ICD-10-CM | POA: Diagnosis not present

## 2024-05-11 DIAGNOSIS — I472 Ventricular tachycardia, unspecified: Secondary | ICD-10-CM | POA: Diagnosis not present

## 2024-05-11 DIAGNOSIS — E11621 Type 2 diabetes mellitus with foot ulcer: Secondary | ICD-10-CM | POA: Diagnosis not present

## 2024-05-11 DIAGNOSIS — I132 Hypertensive heart and chronic kidney disease with heart failure and with stage 5 chronic kidney disease, or end stage renal disease: Secondary | ICD-10-CM | POA: Diagnosis present

## 2024-05-11 DIAGNOSIS — Z800 Family history of malignant neoplasm of digestive organs: Secondary | ICD-10-CM

## 2024-05-11 DIAGNOSIS — N186 End stage renal disease: Secondary | ICD-10-CM | POA: Diagnosis not present

## 2024-05-11 DIAGNOSIS — B964 Proteus (mirabilis) (morganii) as the cause of diseases classified elsewhere: Secondary | ICD-10-CM | POA: Diagnosis present

## 2024-05-11 DIAGNOSIS — M86172 Other acute osteomyelitis, left ankle and foot: Principal | ICD-10-CM | POA: Diagnosis present

## 2024-05-11 DIAGNOSIS — L97529 Non-pressure chronic ulcer of other part of left foot with unspecified severity: Secondary | ICD-10-CM | POA: Diagnosis not present

## 2024-05-11 DIAGNOSIS — I5042 Chronic combined systolic (congestive) and diastolic (congestive) heart failure: Secondary | ICD-10-CM | POA: Diagnosis present

## 2024-05-11 DIAGNOSIS — M009 Pyogenic arthritis, unspecified: Secondary | ICD-10-CM | POA: Diagnosis not present

## 2024-05-11 DIAGNOSIS — D631 Anemia in chronic kidney disease: Secondary | ICD-10-CM | POA: Diagnosis not present

## 2024-05-11 DIAGNOSIS — L03116 Cellulitis of left lower limb: Secondary | ICD-10-CM | POA: Diagnosis present

## 2024-05-11 DIAGNOSIS — N2581 Secondary hyperparathyroidism of renal origin: Secondary | ICD-10-CM | POA: Diagnosis present

## 2024-05-11 DIAGNOSIS — E1169 Type 2 diabetes mellitus with other specified complication: Secondary | ICD-10-CM | POA: Diagnosis not present

## 2024-05-11 DIAGNOSIS — I10 Essential (primary) hypertension: Secondary | ICD-10-CM | POA: Diagnosis not present

## 2024-05-11 DIAGNOSIS — J45909 Unspecified asthma, uncomplicated: Secondary | ICD-10-CM | POA: Diagnosis not present

## 2024-05-11 DIAGNOSIS — E785 Hyperlipidemia, unspecified: Secondary | ICD-10-CM | POA: Diagnosis present

## 2024-05-11 DIAGNOSIS — E1151 Type 2 diabetes mellitus with diabetic peripheral angiopathy without gangrene: Secondary | ICD-10-CM | POA: Diagnosis not present

## 2024-05-11 DIAGNOSIS — N189 Chronic kidney disease, unspecified: Secondary | ICD-10-CM | POA: Diagnosis present

## 2024-05-11 DIAGNOSIS — Z6829 Body mass index (BMI) 29.0-29.9, adult: Secondary | ICD-10-CM

## 2024-05-11 DIAGNOSIS — Z992 Dependence on renal dialysis: Secondary | ICD-10-CM | POA: Diagnosis not present

## 2024-05-11 DIAGNOSIS — Z8249 Family history of ischemic heart disease and other diseases of the circulatory system: Secondary | ICD-10-CM

## 2024-05-11 DIAGNOSIS — I70269 Atherosclerosis of native arteries of extremities with gangrene, unspecified extremity: Secondary | ICD-10-CM | POA: Diagnosis not present

## 2024-05-11 DIAGNOSIS — T8249XA Other complication of vascular dialysis catheter, initial encounter: Secondary | ICD-10-CM | POA: Diagnosis not present

## 2024-05-11 DIAGNOSIS — E66811 Obesity, class 1: Secondary | ICD-10-CM | POA: Diagnosis present

## 2024-05-11 DIAGNOSIS — I428 Other cardiomyopathies: Secondary | ICD-10-CM | POA: Diagnosis not present

## 2024-05-11 DIAGNOSIS — D689 Coagulation defect, unspecified: Secondary | ICD-10-CM | POA: Diagnosis not present

## 2024-05-11 DIAGNOSIS — M25475 Effusion, left foot: Secondary | ICD-10-CM | POA: Diagnosis not present

## 2024-05-11 DIAGNOSIS — Z833 Family history of diabetes mellitus: Secondary | ICD-10-CM

## 2024-05-11 DIAGNOSIS — Z634 Disappearance and death of family member: Secondary | ICD-10-CM

## 2024-05-11 LAB — HEMOGLOBIN A1C
Hgb A1c MFr Bld: 6.4 % — ABNORMAL HIGH (ref 4.8–5.6)
Mean Plasma Glucose: 136.98 mg/dL

## 2024-05-11 LAB — COMPREHENSIVE METABOLIC PANEL WITH GFR
ALT: 17 U/L (ref 0–44)
AST: 21 U/L (ref 15–41)
Albumin: 2.4 g/dL — ABNORMAL LOW (ref 3.5–5.0)
Alkaline Phosphatase: 44 U/L (ref 38–126)
Anion gap: 14 (ref 5–15)
BUN: 25 mg/dL — ABNORMAL HIGH (ref 6–20)
CO2: 28 mmol/L (ref 22–32)
Calcium: 8.8 mg/dL — ABNORMAL LOW (ref 8.9–10.3)
Chloride: 90 mmol/L — ABNORMAL LOW (ref 98–111)
Creatinine, Ser: 7.47 mg/dL — ABNORMAL HIGH (ref 0.61–1.24)
GFR, Estimated: 9 mL/min — ABNORMAL LOW (ref >60)
Glucose, Bld: 195 mg/dL — ABNORMAL HIGH (ref 70–99)
Potassium: 4.1 mmol/L (ref 3.5–5.1)
Sodium: 132 mmol/L — ABNORMAL LOW (ref 135–145)
Total Bilirubin: 0.5 mg/dL (ref 0.0–1.2)
Total Protein: 8.1 g/dL (ref 6.5–8.1)

## 2024-05-11 LAB — I-STAT CHEM 8, ED
BUN: 24 mg/dL — ABNORMAL HIGH (ref 6–20)
Calcium, Ion: 1 mmol/L — ABNORMAL LOW (ref 1.15–1.40)
Chloride: 93 mmol/L — ABNORMAL LOW (ref 98–111)
Creatinine, Ser: 7.6 mg/dL — ABNORMAL HIGH (ref 0.61–1.24)
Glucose, Bld: 197 mg/dL — ABNORMAL HIGH (ref 70–99)
HCT: 39 % (ref 39.0–52.0)
Hemoglobin: 13.3 g/dL (ref 13.0–17.0)
Potassium: 4.2 mmol/L (ref 3.5–5.1)
Sodium: 134 mmol/L — ABNORMAL LOW (ref 135–145)
TCO2: 29 mmol/L (ref 22–32)

## 2024-05-11 LAB — GLUCOSE, CAPILLARY
Glucose-Capillary: 134 mg/dL — ABNORMAL HIGH (ref 70–99)
Glucose-Capillary: 174 mg/dL — ABNORMAL HIGH (ref 70–99)

## 2024-05-11 LAB — CBC WITH DIFFERENTIAL/PLATELET
Abs Immature Granulocytes: 0.08 K/uL — ABNORMAL HIGH (ref 0.00–0.07)
Basophils Absolute: 0.1 K/uL (ref 0.0–0.1)
Basophils Relative: 0 %
Eosinophils Absolute: 0.1 K/uL (ref 0.0–0.5)
Eosinophils Relative: 1 %
HCT: 36.7 % — ABNORMAL LOW (ref 39.0–52.0)
Hemoglobin: 11.4 g/dL — ABNORMAL LOW (ref 13.0–17.0)
Immature Granulocytes: 1 %
Lymphocytes Relative: 5 %
Lymphs Abs: 0.8 K/uL (ref 0.7–4.0)
MCH: 25.8 pg — ABNORMAL LOW (ref 26.0–34.0)
MCHC: 31.1 g/dL (ref 30.0–36.0)
MCV: 83 fL (ref 80.0–100.0)
Monocytes Absolute: 1.2 K/uL — ABNORMAL HIGH (ref 0.1–1.0)
Monocytes Relative: 7 %
Neutro Abs: 13.9 K/uL — ABNORMAL HIGH (ref 1.7–7.7)
Neutrophils Relative %: 86 %
Platelets: 417 K/uL — ABNORMAL HIGH (ref 150–400)
RBC: 4.42 MIL/uL (ref 4.22–5.81)
RDW: 18.1 % — ABNORMAL HIGH (ref 11.5–15.5)
WBC: 16.1 K/uL — ABNORMAL HIGH (ref 4.0–10.5)
nRBC: 0 % (ref 0.0–0.2)

## 2024-05-11 LAB — RESP PANEL BY RT-PCR (RSV, FLU A&B, COVID)  RVPGX2
Influenza A by PCR: NEGATIVE
Influenza B by PCR: NEGATIVE
Resp Syncytial Virus by PCR: NEGATIVE
SARS Coronavirus 2 by RT PCR: NEGATIVE

## 2024-05-11 LAB — I-STAT CG4 LACTIC ACID, ED
Lactic Acid, Venous: 0.7 mmol/L (ref 0.5–1.9)
Lactic Acid, Venous: 1 mmol/L (ref 0.5–1.9)

## 2024-05-11 LAB — PROTIME-INR
INR: 1.2 (ref 0.8–1.2)
Prothrombin Time: 16.1 s — ABNORMAL HIGH (ref 11.4–15.2)

## 2024-05-11 MED ORDER — HYDRALAZINE HCL 50 MG PO TABS
50.0000 mg | ORAL_TABLET | Freq: Three times a day (TID) | ORAL | Status: DC
  Administered 2024-05-11 – 2024-05-13 (×5): 50 mg via ORAL
  Filled 2024-05-11 (×2): qty 1
  Filled 2024-05-11: qty 2
  Filled 2024-05-11 (×2): qty 1
  Filled 2024-05-11 (×2): qty 2

## 2024-05-11 MED ORDER — SODIUM CHLORIDE 0.9% FLUSH: 3.0000 mL | INTRAVENOUS | Status: DC | PRN

## 2024-05-11 MED ORDER — MELATONIN 5 MG PO TABS
5.0000 mg | ORAL_TABLET | Freq: Every evening | ORAL | Status: DC | PRN
  Administered 2024-05-11: 5 mg via ORAL
  Filled 2024-05-11 (×2): qty 1

## 2024-05-11 MED ORDER — ACETAMINOPHEN 500 MG PO TABS
1000.0000 mg | ORAL_TABLET | Freq: Four times a day (QID) | ORAL | Status: DC | PRN
  Administered 2024-05-11: 1000 mg via ORAL
  Filled 2024-05-11: qty 2

## 2024-05-11 MED ORDER — SODIUM CHLORIDE 0.9 % IV SOLN: 250.0000 mL | INTRAVENOUS | Status: AC | PRN

## 2024-05-11 MED ORDER — VANCOMYCIN HCL 2000 MG/400ML IV SOLN
2000.0000 mg | Freq: Once | INTRAVENOUS | Status: AC
  Administered 2024-05-11: 2000 mg via INTRAVENOUS
  Filled 2024-05-11: qty 400

## 2024-05-11 MED ORDER — INSULIN GLARGINE 100 UNIT/ML ~~LOC~~ SOLN
7.0000 [IU] | Freq: Once | SUBCUTANEOUS | Status: AC
  Administered 2024-05-11: 7 [IU] via SUBCUTANEOUS
  Filled 2024-05-11: qty 0.07

## 2024-05-11 MED ORDER — INSULIN ASPART 100 UNIT/ML IJ SOLN
0.0000 [IU] | Freq: Three times a day (TID) | INTRAMUSCULAR | Status: DC
  Administered 2024-05-11: 1 [IU] via SUBCUTANEOUS
  Administered 2024-05-12 (×2): 2 [IU] via SUBCUTANEOUS
  Administered 2024-05-12: 1 [IU] via SUBCUTANEOUS

## 2024-05-11 MED ORDER — INSULIN GLARGINE-YFGN 100 UNIT/ML ~~LOC~~ SOPN
15.0000 [IU] | PEN_INJECTOR | Freq: Every evening | SUBCUTANEOUS | Status: DC
  Filled 2024-05-11: qty 3

## 2024-05-11 MED ORDER — NITROGLYCERIN 0.4 MG SL SUBL
0.4000 mg | SUBLINGUAL_TABLET | SUBLINGUAL | Status: DC | PRN
  Administered 2024-05-12: 0.4 mg via SUBLINGUAL
  Filled 2024-05-11: qty 1

## 2024-05-11 MED ORDER — METHOCARBAMOL 500 MG PO TABS
500.0000 mg | ORAL_TABLET | Freq: Three times a day (TID) | ORAL | Status: DC | PRN
  Administered 2024-05-11 – 2024-05-12 (×2): 500 mg via ORAL
  Filled 2024-05-11 (×2): qty 1

## 2024-05-11 MED ORDER — PANTOPRAZOLE SODIUM 20 MG PO TBEC
20.0000 mg | DELAYED_RELEASE_TABLET | Freq: Every day | ORAL | Status: DC
  Administered 2024-05-11 – 2024-05-13 (×2): 20 mg via ORAL
  Filled 2024-05-11 (×2): qty 1

## 2024-05-11 MED ORDER — HEPARIN SODIUM (PORCINE) 5000 UNIT/ML IJ SOLN
5000.0000 [IU] | Freq: Three times a day (TID) | INTRAMUSCULAR | Status: DC
  Administered 2024-05-11 (×2): 5000 [IU] via SUBCUTANEOUS
  Filled 2024-05-11 (×2): qty 1

## 2024-05-11 MED ORDER — ACETAMINOPHEN 325 MG PO TABS
ORAL_TABLET | ORAL | Status: AC
  Filled 2024-05-11: qty 2

## 2024-05-11 MED ORDER — TENAPANOR HCL (CKD) 30 MG PO TABS: 30.0000 mg | ORAL_TABLET | Freq: Two times a day (BID) | ORAL | Status: DC

## 2024-05-11 MED ORDER — VANCOMYCIN HCL IN DEXTROSE 1-5 GM/200ML-% IV SOLN
1000.0000 mg | Freq: Once | INTRAVENOUS | Status: DC
  Filled 2024-05-11: qty 200

## 2024-05-11 MED ORDER — SODIUM CHLORIDE 0.9% FLUSH
3.0000 mL | Freq: Two times a day (BID) | INTRAVENOUS | Status: DC
  Administered 2024-05-11 – 2024-05-13 (×4): 3 mL via INTRAVENOUS

## 2024-05-11 MED ORDER — CARVEDILOL 25 MG PO TABS
50.0000 mg | ORAL_TABLET | Freq: Two times a day (BID) | ORAL | Status: DC
  Administered 2024-05-12 – 2024-05-13 (×3): 50 mg via ORAL
  Filled 2024-05-11: qty 4
  Filled 2024-05-11 (×2): qty 2
  Filled 2024-05-11: qty 8

## 2024-05-11 MED ORDER — VANCOMYCIN HCL 1000 MG IV SOLR
1000.0000 mg | INTRAVENOUS | Status: DC
  Administered 2024-05-13: 1000 mg via INTRAVENOUS
  Filled 2024-05-11: qty 20

## 2024-05-11 MED ORDER — INSULIN GLARGINE 100 UNIT/ML ~~LOC~~ SOLN
15.0000 [IU] | Freq: Every evening | SUBCUTANEOUS | Status: DC
  Administered 2024-05-12: 15 [IU] via SUBCUTANEOUS
  Filled 2024-05-11 (×3): qty 0.15

## 2024-05-11 MED ORDER — SODIUM CHLORIDE 0.9% FLUSH
10.0000 mL | Freq: Two times a day (BID) | INTRAVENOUS | Status: DC
  Administered 2024-05-11 – 2024-05-12 (×2): 10 mL

## 2024-05-11 MED ORDER — SODIUM CHLORIDE 0.9 % IV SOLN
3.0000 g | Freq: Once | INTRAVENOUS | Status: DC
  Filled 2024-05-11: qty 8

## 2024-05-11 MED ORDER — ATORVASTATIN CALCIUM 80 MG PO TABS
80.0000 mg | ORAL_TABLET | Freq: Every day | ORAL | Status: DC
  Administered 2024-05-11 – 2024-05-13 (×2): 80 mg via ORAL
  Filled 2024-05-11: qty 1
  Filled 2024-05-11: qty 2

## 2024-05-11 MED ORDER — SEVELAMER CARBONATE 800 MG PO TABS
2400.0000 mg | ORAL_TABLET | Freq: Three times a day (TID) | ORAL | Status: DC
  Administered 2024-05-11 – 2024-05-13 (×3): 2400 mg via ORAL
  Filled 2024-05-11 (×3): qty 3

## 2024-05-11 MED ORDER — SODIUM CHLORIDE 0.9 % IV SOLN
3.0000 g | INTRAVENOUS | Status: DC
  Administered 2024-05-12: 3 g via INTRAVENOUS
  Filled 2024-05-11: qty 8

## 2024-05-11 MED ORDER — ONDANSETRON HCL 4 MG/2ML IJ SOLN
4.0000 mg | Freq: Four times a day (QID) | INTRAMUSCULAR | Status: DC | PRN
  Administered 2024-05-12: 4 mg via INTRAVENOUS

## 2024-05-11 MED ORDER — SODIUM CHLORIDE 0.9 % IV SOLN
3.0000 g | Freq: Once | INTRAVENOUS | Status: AC
  Administered 2024-05-11: 3 g via INTRAVENOUS

## 2024-05-11 MED ORDER — RENA-VITE PO TABS
1.0000 | ORAL_TABLET | Freq: Every day | ORAL | Status: DC
  Administered 2024-05-11 – 2024-05-13 (×2): 1 via ORAL
  Filled 2024-05-11 (×2): qty 1

## 2024-05-11 MED ORDER — ACETAMINOPHEN 325 MG PO TABS
650.0000 mg | ORAL_TABLET | Freq: Four times a day (QID) | ORAL | Status: AC | PRN
  Administered 2024-05-11: 650 mg via ORAL

## 2024-05-11 MED ORDER — SODIUM CHLORIDE 0.9 % IV SOLN: Freq: Once | INTRAVENOUS | Status: AC

## 2024-05-11 MED ORDER — SODIUM CHLORIDE 0.9% FLUSH: 10.0000 mL | INTRAVENOUS | Status: DC | PRN

## 2024-05-11 MED ORDER — CHLORHEXIDINE GLUCONATE CLOTH 2 % EX PADS
6.0000 | MEDICATED_PAD | Freq: Every day | CUTANEOUS | Status: DC
  Administered 2024-05-12 – 2024-05-13 (×2): 6 via TOPICAL

## 2024-05-11 MED ORDER — ONDANSETRON HCL 4 MG PO TABS
4.0000 mg | ORAL_TABLET | Freq: Four times a day (QID) | ORAL | Status: DC | PRN
  Administered 2024-05-12: 4 mg via ORAL
  Filled 2024-05-11: qty 1

## 2024-05-11 MED ORDER — LACTATED RINGERS IV BOLUS: 1000.0000 mL | Freq: Once | INTRAVENOUS | Status: DC

## 2024-05-11 NOTE — Assessment & Plan Note (Signed)
Treatment as outlined in 1

## 2024-05-11 NOTE — ED Notes (Signed)
 Extra Blue & DG drawn

## 2024-05-11 NOTE — Progress Notes (Signed)
 Pt admitted for left foot ulcer unhealed. A&OX4.BP 119/60 (BP Location: Left Arm)   Pulse 96   Temp 98.8 F (37.1 C) (Oral)   Resp 17   SpO2 100%  Tele NSR. No c/o pain. Pt oriented to floor, bed in lowest position, 2/4 rails up, bed alarm on and call bell nearby.No other needs voiced at this time. Aureliano VEAR Louder 05/11/24 6:40 PM

## 2024-05-11 NOTE — Assessment & Plan Note (Signed)
Complicates overall prognosis and care ?Lifestyle modification and exercise has been discussed with patient in detail ?

## 2024-05-11 NOTE — Assessment & Plan Note (Addendum)
 Patient has insulin -dependent diabetes mellitus type 2 with complications of end-stage renal disease on hemodialysis Dialysis days are Monday/Wednesday/Friday Continue long-acting insulin , decrease dose to 7 units (one half of his scheduled dose) since patient to be n.p.o. past midnight for procedure in a.m. Sliding scale coverage Maintain consistent carbohydrate diet

## 2024-05-11 NOTE — Sepsis Progress Note (Signed)
 eLink is following this Code Sepsis.

## 2024-05-11 NOTE — ED Provider Notes (Signed)
 [place] EMERGENCY DEPARTMENT AT [facility] Provider Note   CSN: [id number] Arrival date & time: 05/11/24  1125     Patient presents with: No chief complaint on file.   Jeremy [name] is a 43 y.o. male.   HPI  Patient is a 43 year old male with past medical history significant for CKD on dialysis presents emergency room today with left foot pain  Patient also with history of DM 2, obesity, seizures, HLD, CHF, cardiomyopathy.   Patient states that he has had wound on his left forefoot for weeks but has become more malodorous and painful he has poor sensation at baseline in his feet due to his diabetes. 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  Was seen by podiatry yesterday.    Prior to Admission medications   Medication Sig Start Date End Date Taking? Authorizing Provider  Accu-Chek Softclix Lancets lancets Use as directed four times daily. 10/15/23     acetaminophen  (TYLENOL ) 500 MG tablet Take 1,000 mg by mouth every 6 (six) hours as needed for moderate pain.    [provider]  atorvastatin  (LIPITOR ) 80 MG tablet Take 1 tablet (80 mg total) by mouth daily. 01/20/24     blood glucose meter kit and supplies KIT Use to check blood sugar up to four times daily as directed. 10/15/23   Masters, Katie, DO  carvedilol  (COREG ) 25 MG tablet Take 2 tablets (50 mg total) by mouth 2 (two) times daily. 10/15/23   Masters, Izetta, DO  carvedilol  (COREG ) 25 MG tablet Take 1 tablet by mouth twice a day with meals Patient not taking: Reported on 04/05/2024 03/23/24     doxycycline  (VIBRA -TABS) 100 MG tablet Take 1 tablet (100 mg total) by mouth every 12 (twelve) hours with food for 7 days. 04/13/24   Macel Jayson PARAS, MD  glucose blood (ACCU-CHEK GUIDE TEST) test strip Use as directed four times daily. 10/15/23     hydrALAZINE  (APRESOLINE ) 50 MG tablet Take 1 tablet by mouth 3 times a day 01/04/24     insulin  aspart (NOVOLOG  FLEXPEN) 100 UNIT/ML FlexPen Inject 15  Units into the skin 3 (three) times daily as needed for high blood sugar. 02/05/24   Tobie Gaines, DO  insulin  glargine-yfgn (SEMGLEE ) 100 UNIT/ML Pen Inject 15 Units into the skin daily. Patient taking differently: Inject 15 Units into the skin every evening. 11/01/23   Masters, Izetta, DO  methocarbamol  (ROBAXIN ) 500 MG tablet Take 1 tablet (500 mg total) by mouth daily as needed. 01/26/24     multivitamin (RENA-VIT) TABS tablet Take 1 tablet by mouth daily.    [provider]  nitroGLYCERIN  (NITROSTAT ) 0.4 MG SL tablet Place 1 tablet (0.4 mg total) under the tongue every 5 (five) minutes x 3 doses as needed within 15 minutes. If chest pain does not resolve, seek medical attention. 11/10/23   Clegg, Amy D, NP  ondansetron  (ZOFRAN ) 4 MG tablet Take 1 tablet (4 mg total) by mouth daily as needed. 11/04/23     pantoprazole  (PROTONIX ) 20 MG tablet Take 1 tablet (20 mg total) by mouth daily as directed for reflux. 02/03/24     pantoprazole  (PROTONIX ) 40 MG tablet Take 1 tablet (40 mg total) by mouth daily. 01/01/24 12/31/24  Alexander-Savino, [place], MD  sevelamer  carbonate (RENVELA ) 800 MG tablet Take 3 tablets (2,400 mg total) by mouth 3 (three) times daily with meals 11/01/23   Masters, Izetta, DO  Tenapanor  HCl, CKD, (XPHOZAH ) 30 MG TABS Take 1 tablet by  mouth twice a day as directed Take 1 in the morning and 1 at night 02/11/24       Allergies: Acyclovir and related and Imdur  [isosorbide  nitrate]    Review of Systems  Updated Vital Signs BP (!) 120/53 (BP Location: Left Arm)   Pulse (!) 106   Temp 98.8 F (37.1 C)   Resp 18   SpO2 97%   Physical Exam Vitals and nursing note reviewed.  Constitutional:      General: He is not in acute distress.    Appearance: Normal appearance. He is ill-appearing.     Comments: Chronically ill-appearing is unwell with pain today  HENT:     Head: Normocephalic and atraumatic.     Nose: Nose normal.     Mouth/Throat:     Mouth: Mucous membranes are  moist.  Eyes:     General: No scleral icterus.       Right eye: No discharge.        Left eye: No discharge.     Conjunctiva/sclera: Conjunctivae normal.  Cardiovascular:     Rate and Rhythm: Regular rhythm. Tachycardia present.     Pulses: Normal pulses.     Heart sounds: Normal heart sounds.     Comments: Dialysis catheter is in chest wall Pulmonary:     Effort: Pulmonary effort is normal. No respiratory distress.     Breath sounds: No stridor. No wheezing.  Abdominal:     Palpations: Abdomen is soft.     Tenderness: There is no abdominal tenderness. There is no guarding or rebound.  Musculoskeletal:     Cervical back: Normal range of motion.     Comments: DP PT pulses palpable  Skin:    General: Skin is warm and dry.     Capillary Refill: Capillary refill takes less than 2 seconds.     Comments: Erosive ulcer in the plantar aspect of the left forefoot which is malodorous, skin is warm to touch  Neurological:     Mental Status: He is alert and oriented to person, place, and time. Mental status is at baseline.  Psychiatric:        Mood and Affect: Mood normal.        Behavior: Behavior normal.     (all labs ordered are listed, but only abnormal results are displayed) Labs Reviewed  COMPREHENSIVE METABOLIC PANEL WITH GFR - Abnormal; Notable for the following components:      Result Value   Sodium 132 (*)    Chloride 90 (*)    Glucose, Bld 195 (*)    BUN 25 (*)    Creatinine, Ser 7.47 (*)    Calcium  8.8 (*)    Albumin  2.4 (*)    GFR, Estimated 9 (*)    All other components within normal limits  CBC WITH DIFFERENTIAL/PLATELET - Abnormal; Notable for the following components:   WBC 16.1 (*)    Hemoglobin 11.4 (*)    HCT 36.7 (*)    MCH 25.8 (*)    RDW 18.1 (*)    Platelets 417 (*)    Neutro Abs 13.9 (*)    Monocytes Absolute 1.2 (*)    Abs Immature Granulocytes 0.08 (*)    All other components within normal limits  I-STAT CHEM 8, ED - Abnormal; Notable for the  following components:   Sodium 134 (*)    Chloride 93 (*)    BUN 24 (*)    Creatinine, Ser 7.60 (*)    Glucose, Bld  197 (*)    Calcium , Ion 1.00 (*)    All other components within normal limits  CULTURE, BLOOD (ROUTINE X 2)  CULTURE, BLOOD (ROUTINE X 2)  I-STAT CG4 LACTIC ACID, ED  I-STAT CG4 LACTIC ACID, ED    EKG: None  Radiology: MR FOOT LEFT WO CONTRAST Result Date: 05/11/2024 CLINICAL DATA:  Ulceration of the left plantar foot underlying the distal second metatarsal. History of prior partial third ray amputation and partial amputation of the fourth proximal phalanx. Concern for osteomyelitis. EXAM: MRI OF THE LEFT FOOT WITHOUT CONTRAST TECHNIQUE: Multiplanar, multisequence MR imaging of the left forefoot was performed. No intravenous contrast was administered. COMPARISON:  Left foot radiographs dated 05/10/2024. MRI of the left foot dated 10/26/2023. FINDINGS: Bones/Joint/Cartilage Soft tissue ulceration at the plantar forefoot, overlying the second metatarsal head, with sinus tract extending through the plantar soft tissues to the level of the underlying second metatarsal head/second MTP joint. There is T2 hyperintense marrow signal abnormality with corresponding T1 hypointensity of the second metatarsal head and neck and the second proximal phalanx with a small second MTP joint effusion, most compatible with acute osteomyelitis/septic arthritis. Extensive surrounding soft tissue edema at the level of the second MTP joint and through the mid second digit with surrounding foci of susceptibility artifact, likely reflecting soft tissue air. No marrow signal abnormality identified elsewhere to suggest osteomyelitis. Postsurgical changes related to prior amputation of the third ray through the base of the third metatarsal and partial amputation of the base of the fourth proximal phalanx. Ligaments Lisfranc ligament is intact. Muscles and Tendons Postoperative changes related to prior amputations.  Atrophy of the intrinsic foot musculature, likely reflects chronic denervation changes. No significant tenosynovitis. Soft tissue Soft tissue ulceration at the plantar forefoot with sinus tract extending through the plantar soft tissues to the level of the underlying second metatarsal head/second MTP joint. Associated surrounding soft tissue edema with foci of susceptibility artifact, compatible with soft tissue air, which may be communicating from the overlying wound, however, gas-forming infection can not be entirely excluded. No drainable loculated fluid collection. Postoperative changes related to prior amputations. IMPRESSION: 1. Soft tissue ulceration at the plantar forefoot with sinus tract extending to the level of the underlying second metatarsal head/second MTP joint. Marrow signal abnormality of the second metatarsal head and neck and the second proximal phalanx with a small second MTP joint effusion is most compatible with acute osteomyelitis/septic arthritis. Extensive surrounding soft tissue edema at second MTP joint and through the mid second digit with surrounding foci of susceptibility artifact, likely reflecting soft tissue air, which may be communicating from the overlying wound, however, gas-forming infection can not be excluded. No drainable fluid collection. 2. 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 Electronically Signed   By: Harrietta Sherry M.D.   On: 05/11/2024 14:40   DG Foot Complete Left Result Date: 05/11/2024 Please see detailed radiograph report in office note.    .Critical Care  Performed by: Neldon Hamp RAMAN, PA Authorized by: Neldon Hamp RAMAN, PA   Critical care provider statement:    Critical care time (minutes):  35   Critical care time was exclusive of:  Separately billable procedures and treating other patients and teaching time   Critical care was  necessary to treat or prevent imminent or life-threatening deterioration of the following conditions:  Sepsis   Critical care was time spent personally by  me on the following activities:  Development of treatment plan with patient or surrogate, review of old charts, re-evaluation of patient's condition, pulse oximetry, ordering and review of radiographic studies, ordering and review of laboratory studies, ordering and performing treatments and interventions, obtaining history from patient or surrogate, examination of patient and evaluation of patient's response to treatment   Care discussed with: admitting provider      Medications Ordered in the ED  lactated ringers  bolus 1,000 mL (has no administration in time range)  Ampicillin -Sulbactam (UNASYN ) 3 g in sodium chloride  0.9 % 100 mL IVPB (has no administration in time range)    And  vancomycin  (VANCOCIN ) IVPB 1000 mg/200 mL premix (has no administration in time range)  acetaminophen  (TYLENOL ) tablet 650 mg (650 mg Oral Given 05/11/24 1209)                                    Medical Decision Making Amount and/or Complexity of Data Reviewed Labs: ordered.  Risk OTC drugs. Prescription drug management. Decision regarding hospitalization.   This patient presents to the ED for concern of sepsis , this involves a number of treatment options, and is a complaint that carries with it a high risk of complications and morbidity. A differential diagnosis was considered for the patient's symptoms which is discussed below:  In this particular case I believe that this patient's sepsis is secondary to wound I did order respiratory swab however I have a low suspicion for UTI, pneumonia, or other emergent cause of patient's septic mentation.   Co morbidities: Discussed in HPI   Brief History:  Patient is a 43 year old male with past medical history significant for CKD on dialysis presents emergency room today with left foot pain  Patient also  with history of DM 2, obesity, seizures, HLD, CHF, cardiomyopathy.   Patient states that he has had wound on his left forefoot for weeks but has become more malodorous and painful he has poor sensation at baseline in his feet due to his diabetes. 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  Was seen by podiatry yesterday.    EMR reviewed including pt PMHx, past surgical history and past visits to ER.   See HPI for more details   Lab Tests:   I ordered and independently interpreted labs. Labs notable for Leukocytosis of 16.1 mild anemia CMP consistent with patient being a CKD patient but no acute emergent abnormalities.  Lactate x 1 normal blood cultures obtained  Imaging Studies:  Abnormal findings. I personally reviewed all imaging studies. Imaging notable for MRI shows osteomyelitis   Cardiac Monitoring:  NA    Medicines ordered:  I ordered medication including vancomycin , Unasyn , Tylenol  for fever and antibiosis Reevaluation of the patient after these medicines showed that the patient stayed the same I have reviewed the patients home medicines and have made adjustments as needed   Critical Interventions:     Consults/Attending Physician   I requested consultation with podiatry,  and discussed lab and imaging findings as well as pertinent plan - they recommend: Admission to hospital with vancomycin  and Unasyn  and ABI   Reevaluation:  After the interventions noted above I re-evaluated patient and found that they have :improved   Social Determinants of Health:      Problem List / ED Course:  Patient with sepsis due to osteomyelitis and cellulitis of left forefoot.  Podiatry aware of patient and  planning transmetatarsal amputation.   Dispostion:  After consideration of the diagnostic results and the patients response to treatment, I feel that the patent would benefit from admission  Final diagnoses:  Other acute osteomyelitis of  left foot [facility])    ED Discharge Orders     None          Neldon Hamp RAMAN, [place] 05/11/24 1545    Pamella Ozell LABOR, DO 05/17/24 1514

## 2024-05-11 NOTE — Assessment & Plan Note (Addendum)
 Continue carvedilol  and hydralazine  Blood pressure is stable

## 2024-05-11 NOTE — Assessment & Plan Note (Signed)
H&H is stable ?Monitor closely during this hospitalization ?

## 2024-05-11 NOTE — ED Triage Notes (Addendum)
 PT sent by [facility] doc for MRI to rule out osteomyelitis in left foot. Pt had 4th toe removed from that foot a couple of months ago and has not fully healed and has infection in that foot.  Pt does not appear in any distress at this time. He endorses 10/10 pain in that foot.

## 2024-05-11 NOTE — H&P (Signed)
 History and Physical    Patient: Jeremy [name] FMW:[id number] DOB: 02-19-1981 DOA: 05/11/2024 DOS: the patient was seen and examined on 05/11/2024 PCP: D'Mello, Rosalyn, DO  Patient coming from: Home  Chief Complaint: No chief complaint on file.  HPI: Jeremy [name] is a 43 y.o. male with medical history significant for diabetes mellitus with complications of end-stage renal disease on hemodialysis (M/W/F), history of chronic systolic CHF with last known LVEF of 35% from a 2D echocardiogram which was done 02/25, history of hypertension, peripheral arterial disease status post partial third ray resection and partial amputation fourth proximal phalanx 02/25, anemia of chronic kidney disease, nonischemic cardiomyopathy who was sent to the ER by his podiatrist for evaluation for possible osteomyelitis involving his left foot. Patient notes that he has had this left plantar ulcer for several months and had undergone I&D of the surgical wound as well as partial third ray resection and partial amputation for proximal phalanx.  According to him the ulcer never healed and over the last several days he noted increased foul-smelling drainage from the wound and so he went to see his podiatrist who advised an MRI to rule out osteomyelitis. He had a low-grade fever in the emergency room with a Tmax of 100.4 but denied having any chills. He denies having any chest pain, no shortness of breath, no nausea, no vomiting, no abdominal pain, no dizziness, no lightheadedness, no headache, no blurred vision no focal deficit. Abnormal labs include white count 16 with a left shift, hemoglobin 11.4, platelet count 417, sodium 132, glucose 195, BUN 25, creatinine 7.47 MRI of the left foot shows soft tissue ulceration at the plantar forefoot with sinus tract extending to the level of the underlying second metatarsal head/second MTP joint. Marrow signal abnormality of the second metatarsal head and neck and the second proximal  phalanx with a small second MTP joint effusion is most compatible with acute osteomyelitis/septic arthritis. Extensive surrounding soft tissue edema at second MTP joint and through the mid second digit with surrounding foci of susceptibility artifact, likely reflecting soft tissue air, which may be communicating from the overlying wound, however, gas-forming infection can not be excluded. No drainable fluid collection. 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 Podiatry was consulted in the ER and patient received a dose of vancomycin  and Unasyn . He will be admitted to the hospital for further evaluation.       Review of Systems: As mentioned in the history of present illness. All other systems reviewed and are negative. 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 01/2015 Lexiscan  MV: EF 28%, inferior, inferolateral, apical ischemia;  b. 01/2015 Cath: nl cors, PCWP 18 mmHg, CO 9.38 L/min, CI 3.53 L/min/m^2.   CHF (congestive heart failure) (HCC)    Depression    Situational   ESRD (end stage renal disease) (HCC)    TTHS- [place]   Essential hypertension    Family history of adverse reaction to anesthesia    sister- it was too much for her heart died   Flash pulmonary edema (HCC) 09/18/2022   GERD (gastroesophageal reflux disease)    Hyperlipidemia    Membranous glomerulonephritis    Morbid obesity (  HCC)    Nonischemic cardiomyopathy (HCC)    a. 01/2015 Echo: EF 20-25%, diff HK, Gr 2 DD, Triv AI, mildly dil LA and Ao root.   PONV (postoperative nausea and vomiting)    Seizures (HCC) 02/29/2020   electralytes were out of wack.   Type II diabetes mellitus (HCC)    a. 01/2015 HbA1c = 8.9.   Past Surgical  History:  Procedure Laterality Date   A/V FISTULAGRAM N/A 08/12/2021   Procedure: A/V FISTULAGRAM;  Surgeon: Sheree Penne Bruckner, MD;  Location: [facility] INVASIVE CV LAB;  Service: Cardiovascular;  Laterality: N/A;   AMPUTATION Left 10/28/2023   Procedure: AMPUTATION RAY FOOT;  Surgeon: Malvin Marsa FALCON, DPM;  Location: [facility] OR;  Service: Orthopedics/Podiatry;  Laterality: Left;  Partial 3rd ray resection, I&D, antibiotic beads, wound vac   AV FISTULA PLACEMENT Right 12/18/2020   Procedure: RIGHT ARM RADIOCEPHALIC  ARTERIOVENOUS (AV) FISTULA CREATION;  Surgeon: Sheree Penne Bruckner, MD;  Location: [facility] OR;  Service: Vascular;  Laterality: Right;   CARDIAC CATHETERIZATION N/A 02/01/2015   Procedure: Right/Left Heart Cath and Coronary Angiography;  Surgeon: Maude JAYSON Emmer, MD;  Location: [facility] INVASIVE CV LAB;  Service: Cardiovascular;  Laterality: N/A;   INCISION AND DRAINAGE Left 09/05/2023   Procedure: INCISION AND DRAINAGE WITH 3RD TOE METATARSAL RESECTION LEFT FOOT AND 4TH TOE PROXIMAL PHALANX RESECTION;  Surgeon: Malvin Marsa FALCON, DPM;  Location: [facility] OR;  Service: Orthopedics/Podiatry;  Laterality: Left;  I&D with possible metatarsal resection, left foot   IR FLUORO GUIDE CV LINE LEFT  07/21/2022   IR FLUORO GUIDE CV LINE LEFT  07/21/2022   IR FLUORO GUIDE CV LINE RIGHT  10/11/2019   IR REMOVAL TUN CV CATH W/O FL  07/17/2022   IR REMOVAL TUN CV CATH W/O FL  08/18/2022   IR US  GUIDE VASC ACCESS LEFT  07/21/2022   IR US  GUIDE VASC ACCESS LEFT  07/21/2022   IR US  GUIDE VASC ACCESS RIGHT  10/11/2019   IRRIGATION AND DEBRIDEMENT FOOT Left 10/30/2023   Procedure: IRRIGATION AND DEBRIDEMENT WOUND;  Surgeon: Malvin Marsa FALCON, DPM;  Location: [facility] OR;  Service: Orthopedics/Podiatry;  Laterality: Left;   RIGHT/LEFT HEART CATH AND CORONARY ANGIOGRAPHY N/A 02/02/2024   Procedure: RIGHT/LEFT HEART CATH AND CORONARY ANGIOGRAPHY;  Surgeon: Rolan Ezra RAMAN, MD;  Location: [facility] INVASIVE CV  LAB;  Service: Cardiovascular;  Laterality: N/A;   TEE WITHOUT CARDIOVERSION N/A 07/18/2022   Procedure: TRANSESOPHAGEAL ECHOCARDIOGRAM (TEE);  Surgeon: Alvan Ronal BRAVO, MD;  Location: [facility] ENDOSCOPY;  Service: Cardiovascular;  Laterality: N/A;   THORACOTOMY Left 11/08/2013   Procedure: LEFT THORACOTOMY;  Surgeon: Maude Fleeta Ochoa, MD;  Location: [facility] OR;  Service: Thoracic;  Laterality: Left;   TOOTH EXTRACTION N/A 10/30/2023   Procedure: DENTAL RESTORATION/EXTRACTIONS;  Surgeon: Sheryle Hamilton, DMD;  Location: [facility] OR;  Service: Oral Surgery;  Laterality: N/A;   TRANSESOPHAGEAL ECHOCARDIOGRAM (CATH LAB) N/A 10/29/2023   Procedure: TRANSESOPHAGEAL ECHOCARDIOGRAM;  Surgeon: Rolan Ezra RAMAN, MD;  Location: [facility] INVASIVE CV LAB;  Service: Cardiovascular;  Laterality: N/A;   TUNNELLED CATHETER EXCHANGE N/A 01/21/2024   Procedure: TUNNELLED CATHETER EXCHANGE;  Surgeon: Norine Manuelita LABOR, MD;  Location: [facility] INVASIVE CV LAB;  Service: Cardiovascular;  Laterality: N/A;   TUNNELLED CATHETER EXCHANGE N/A 02/10/2024   Procedure: TUNNELLED CATHETER EXCHANGE;  Surgeon: Gretta Bruckner PARAS, MD;  Location: [facility] LAB;  Service: Cardiovascular;  Laterality: N/A;   TUNNELLED CATHETER EXCHANGE Left 03/04/2024   Procedure: TUNNELLED CATHETER EXCHANGE;  Surgeon: Gretta Bruckner PARAS, MD;  Location: [facility];  Service: Cardiovascular;  Laterality: Left;   Social History:  reports that he has never smoked. He has never used smokeless tobacco. He reports current drug use. Drug: Marijuana. He reports that he does not drink alcohol.  Allergies  Allergen Reactions   Acyclovir And Related Hives   Imdur  [Isosorbide  Nitrate]     Headaches from nitrate    Family History  Problem Relation Age of Onset   Diabetes Mellitus II Mother        died @ 59.   Gastric cancer Mother    CAD Father        died @ 55.   Heart attack Father    Congestive Heart Failure Father    Diabetes Mellitus II Sister    CAD Sister        s/p PCI -  age 34.    Prior to Admission medications   Medication Sig Start Date End Date Taking? Authorizing Provider  Accu-Chek Softclix Lancets lancets Use as directed four times daily. 10/15/23     acetaminophen  (TYLENOL ) 500 MG tablet Take 1,000 mg by mouth every 6 (six) hours as needed for moderate pain.    [provider]  atorvastatin  (LIPITOR ) 80 MG tablet Take 1 tablet (80 mg total) by mouth daily. 01/20/24     blood glucose meter kit and supplies KIT Use to check blood sugar up to four times daily as directed. 10/15/23   Masters, Katie, DO  carvedilol  (COREG ) 25 MG tablet Take 2 tablets (50 mg total) by mouth 2 (two) times daily. 10/15/23   Masters, Izetta, DO  carvedilol  (COREG ) 25 MG tablet Take 1 tablet by mouth twice a day with meals Patient not taking: Reported on 04/05/2024 03/23/24     doxycycline  (VIBRA -TABS) 100 MG tablet Take 1 tablet (100 mg total) by mouth every 12 (twelve) hours with food for 7 days. 04/13/24   Macel Jayson PARAS, MD  glucose blood (ACCU-CHEK GUIDE TEST) test strip Use as directed four times daily. 10/15/23     hydrALAZINE  (APRESOLINE ) 50 MG tablet Take 1 tablet by mouth 3 times a day 01/04/24     insulin  aspart (NOVOLOG  FLEXPEN) 100 UNIT/ML FlexPen Inject 15 Units into the skin 3 (three) times daily as needed for high blood sugar. 02/05/24   Tobie Gaines, DO  insulin  glargine-yfgn (SEMGLEE ) 100 UNIT/ML Pen Inject 15 Units into the skin daily. Patient taking differently: Inject 15 Units into the skin every evening. 11/01/23   Masters, Izetta, DO  methocarbamol  (ROBAXIN ) 500 MG tablet Take 1 tablet (500 mg total) by mouth daily as needed. 01/26/24     multivitamin (RENA-VIT) TABS tablet Take 1 tablet by mouth daily.    [provider]  nitroGLYCERIN  (NITROSTAT ) 0.4 MG SL tablet Place 1 tablet (0.4 mg total) under the tongue every 5 (five) minutes x 3 doses as needed within 15 minutes. If chest pain does not resolve, seek medical attention. 11/10/23   Clegg, Amy D, NP   ondansetron  (ZOFRAN ) 4 MG tablet Take 1 tablet (4 mg total) by mouth daily as needed. 11/04/23     pantoprazole  (PROTONIX ) 20 MG tablet Take 1 tablet (20 mg total) by mouth daily as directed for reflux. 02/03/24     pantoprazole  (PROTONIX ) 40 MG tablet Take 1 tablet (40 mg total) by mouth daily. 01/01/24 12/31/24  Alexander-Savino, [place], MD  sevelamer  carbonate (RENVELA ) 800 MG tablet Take 3 tablets (2,400 mg total) by mouth 3 (three) times daily with meals  11/01/23   Masters, Izetta, DO  Tenapanor  HCl, CKD, (XPHOZAH ) 30 MG TABS Take 1 tablet by mouth twice a day as directed Take 1 in the morning and 1 at night 02/11/24       Physical Exam: Vitals:   05/11/24 1129 05/11/24 1520  BP: (!) 120/53 105/66  Pulse: (!) 106 95  Resp: 18 14  Temp: 98.8 F (37.1 C) (!) 100.4 F (38 C)  TempSrc:  Oral  SpO2: 97% 94%   Physical Exam Vitals and nursing note reviewed.  Constitutional:      Appearance: He is obese.  HENT:     Head: Normocephalic and atraumatic.     Nose: Nose normal.     Mouth/Throat:     Mouth: Mucous membranes are moist.  Eyes:     Comments: Pale conjunctiva  Cardiovascular:     Rate and Rhythm: Normal rate and regular rhythm.  Pulmonary:     Effort: Pulmonary effort is normal.     Breath sounds: Normal breath sounds.  Abdominal:     General: Abdomen is flat. Bowel sounds are normal.     Palpations: Abdomen is soft.  Musculoskeletal:     Comments: Ulcer involving the plantar surface of the left foot with purulent drainage  Skin:    General: Skin is warm and dry.  Neurological:     General: No focal deficit present.     Mental Status: He is alert and oriented to person, place, and time.  Psychiatric:        Mood and Affect: Mood normal.     Data Reviewed: Data Reviewed: Relevant notes from primary care and specialist visits, past discharge summaries as available in EHR, including Care Everywhere. Prior diagnostic testing as pertinent to current admission  diagnoses Updated medications and problem lists for reconciliation ED course, including vitals, labs, imaging, treatment and response to treatment Triage notes, nursing and pharmacy notes and ED provider's notes Notable results as noted in HPI Sodium 132, potassium 4.1, chloride 90, bicarb 28, glucose 195, BUN 25, creatinine 1.47, calcium  8.8, total protein 8.1, AST 21, ALT 17, lactic acid 1.0, white count 16.1 with a left shift, hemoglobin 11.3 36.7, platelet count 417 Labs reviewed  Assessment and Plan: * Osteomyelitis (HCC) Patient presented for evaluation of a nonhealing left plantar wound with increased purulence and now has a low-grade fever and leukocytosis with a left shift MRI of the left foot shows marrow signal abnormality of the second metatarsal head and neck and the second proximal phalanx with a small second MTP joint effusion is most compatible with acute osteomyelitis/septic arthritis. Will consult podiatry Place patient empirically on vancomycin  and Unasyn    Diabetic foot ulcer (HCC) Treatment as outlined in 1  Diabetes mellitus with ESRD (end-stage renal disease) (HCC) Patient has insulin -dependent diabetes mellitus type 2 with complications of end-stage renal disease on hemodialysis Dialysis days are Monday/Wednesday/Friday Continue long-acting insulin , decrease dose to 7 units (one half of his scheduled dose) since patient to be n.p.o. past midnight for procedure in a.m. Sliding scale coverage Maintain consistent carbohydrate diet  ESRD (end stage renal disease) (HCC) Dialysis days are Monday/Wednesday/Friday Nephrology consult for renal replacement therapy  Anemia in chronic kidney disease H&H is stable Monitor closely during this hospitalization   Chronic combined systolic and diastolic CHF (congestive heart failure) (HCC) Last known LVEF of 35% from a 2D echocardiogram which was done 02/25 Not acutely exacerbated Continue renal replacement therapy for  volume status management Continue carvedilol  and hydralazine   Primary hypertension Continue carvedilol  and hydralazine  Blood pressure is stable  Obesity, Class I, BMI 30-34.9 Complicates overall prognosis and care Lifestyle modification and exercise has been discussed with patient in detail      Advance Care Planning:   Code Status: Full Code   Consults: Podiatry, nephrology  Family Communication: Plan of care was discussed with patient at the bedside.  All questions and concerns have been addressed.  He verbalizes understanding and agrees to the plan.  Severity of Illness: The appropriate patient status for this patient is INPATIENT. Inpatient status is judged to be reasonable and necessary in order to provide the required intensity of service to ensure the patient's safety. The patient's presenting symptoms, physical exam findings, and initial radiographic and laboratory data in the context of their chronic comorbidities is felt to place them at high risk for further clinical deterioration. Furthermore, it is not anticipated that the patient will be medically stable for discharge from the hospital within 2 midnights of admission.   * I certify that at the point of admission it is my clinical judgment that the patient will require inpatient hospital care spanning beyond 2 midnights from the point of admission due to high intensity of service, high risk for further deterioration and high frequency of surveillance required.*  Author: Aimee Somerset, MD 05/11/2024 4:35 PM  For on call review [email or web address].

## 2024-05-11 NOTE — Progress Notes (Signed)
 Pharmacy Antibiotic Note  Jeremy [name] is a 43 y.o. male for which pharmacy has been consulted for ampicillin -sulbactam and vancomycin  dosing for wound infection.  Patient with a history of ESRD on HD MWF, HTN, T2DM, GERD. Patient presenting with ulceration of the left plantar foot sent by podiatry for c/f osteomyelitis.  WBC 16.1; LA 1; T 100.4; HR 95; RR 14  Plan: Unasyn  3g q24h (to be given after HD on dialysis days) Vancomycin  2g given in the ED --will plan for 1000 mg MWF post-HD Monitor WBC, fever, renal function, cultures De-escalate when able F/u Nephrology plan     Temp (24hrs), Avg:99.6 F (37.6 C), Min:98.8 F (37.1 C), Max:100.4 F (38 C)  Recent Labs  Lab 05/11/24 1206 05/11/24 1214 05/11/24 1215 05/11/24 1530  WBC 16.1*  --   --   --   CREATININE 7.47* 7.60*  --   --   LATICACIDVEN  --   --  0.7 1.0    CrCl cannot be calculated (Unknown ideal weight.).    Allergies  Allergen Reactions   Acyclovir And Related Hives   Imdur  [Isosorbide  Nitrate]     Headaches from nitrate   Microbiology results: Pending  Thank you for allowing pharmacy to be a part of this patient's care.  Dorn Buttner, PharmD, BCPS 05/11/2024 4:03 PM ED Clinical Pharmacist -  [phone]

## 2024-05-11 NOTE — Assessment & Plan Note (Signed)
 Dialysis days are Monday/Wednesday/Friday Nephrology consult for renal replacement therapy

## 2024-05-11 NOTE — Assessment & Plan Note (Signed)
 Patient presented for evaluation of a nonhealing left plantar wound with increased purulence and now has a low-grade fever and leukocytosis with a left shift MRI of the left foot shows marrow signal abnormality of the second metatarsal head and neck and the second proximal phalanx with a small second MTP joint effusion is most compatible with acute osteomyelitis/septic arthritis. Will consult podiatry Place patient empirically on vancomycin  and Unasyn 

## 2024-05-11 NOTE — Assessment & Plan Note (Signed)
 Last known LVEF of 35% from a 2D echocardiogram which was done 02/25 Not acutely exacerbated Continue renal replacement therapy for volume status management Continue carvedilol  and hydralazine 

## 2024-05-11 NOTE — H&P (View-Only) (Signed)
 History and Physical    Patient: Jeremy [name] FMW:[id number] DOB: 02-19-1981 DOA: 05/11/2024 DOS: the patient was seen and examined on 05/11/2024 PCP: D'Mello, Rosalyn, DO  Patient coming from: Home  Chief Complaint: No chief complaint on file.  HPI: Jeremy [name] is a 43 y.o. male with medical history significant for diabetes mellitus with complications of end-stage renal disease on hemodialysis (M/W/F), history of chronic systolic CHF with last known LVEF of 35% from a 2D echocardiogram which was done 02/25, history of hypertension, peripheral arterial disease status post partial third ray resection and partial amputation fourth proximal phalanx 02/25, anemia of chronic kidney disease, nonischemic cardiomyopathy who was sent to the ER by his podiatrist for evaluation for possible osteomyelitis involving his left foot. Patient notes that he has had this left plantar ulcer for several months and had undergone I&D of the surgical wound as well as partial third ray resection and partial amputation for proximal phalanx.  According to him the ulcer never healed and over the last several days he noted increased foul-smelling drainage from the wound and so he went to see his podiatrist who advised an MRI to rule out osteomyelitis. He had a low-grade fever in the emergency room with a Tmax of 100.4 but denied having any chills. He denies having any chest pain, no shortness of breath, no nausea, no vomiting, no abdominal pain, no dizziness, no lightheadedness, no headache, no blurred vision no focal deficit. Abnormal labs include white count 16 with a left shift, hemoglobin 11.4, platelet count 417, sodium 132, glucose 195, BUN 25, creatinine 7.47 MRI of the left foot shows soft tissue ulceration at the plantar forefoot with sinus tract extending to the level of the underlying second metatarsal head/second MTP joint. Marrow signal abnormality of the second metatarsal head and neck and the second proximal  phalanx with a small second MTP joint effusion is most compatible with acute osteomyelitis/septic arthritis. Extensive surrounding soft tissue edema at second MTP joint and through the mid second digit with surrounding foci of susceptibility artifact, likely reflecting soft tissue air, which may be communicating from the overlying wound, however, gas-forming infection can not be excluded. No drainable fluid collection. 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 Podiatry was consulted in the ER and patient received a dose of vancomycin  and Unasyn . He will be admitted to the hospital for further evaluation.       Review of Systems: As mentioned in the history of present illness. All other systems reviewed and are negative. 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 01/2015 Lexiscan  MV: EF 28%, inferior, inferolateral, apical ischemia;  b. 01/2015 Cath: nl cors, PCWP 18 mmHg, CO 9.38 L/min, CI 3.53 L/min/m^2.   CHF (congestive heart failure) (HCC)    Depression    Situational   ESRD (end stage renal disease) (HCC)    [place]THS- [place]   Essential hypertension    Family history of adverse reaction to anesthesia    sister- it was too much for her heart died   Flash pulmonary edema (HCC) 09/18/2022   GERD (gastroesophageal reflux disease)    Hyperlipidemia    Membranous glomerulonephritis    Morbid obesity (  HCC)    Nonischemic cardiomyopathy (HCC)    a. 01/2015 Echo: EF 20-25%, diff HK, Gr 2 DD, Triv AI, mildly dil LA and Ao root.   PONV (postoperative nausea and vomiting)    Seizures (HCC) 02/29/2020   electralytes were out of wack.   Type II diabetes mellitus (HCC)    a. 01/2015 HbA1c = 8.9.   Past Surgical  History:  Procedure Laterality Date   A/V FISTULAGRAM N/A 08/12/2021   Procedure: A/V FISTULAGRAM;  Surgeon: Sheree Penne Bruckner, MD;  Location: [facility] INVASIVE CV LAB;  Service: Cardiovascular;  Laterality: N/A;   AMPUTATION Left 10/28/2023   Procedure: AMPUTATION RAY FOOT;  Surgeon: Malvin Marsa FALCON, DPM;  Location: [facility] OR;  Service: Orthopedics/Podiatry;  Laterality: Left;  Partial 3rd ray resection, I&D, antibiotic beads, wound vac   AV FISTULA PLACEMENT Right 12/18/2020   Procedure: RIGHT ARM RADIOCEPHALIC  ARTERIOVENOUS (AV) FISTULA CREATION;  Surgeon: Sheree Penne Bruckner, MD;  Location: [facility] OR;  Service: Vascular;  Laterality: Right;   CARDIAC CATHETERIZATION N/A 02/01/2015   Procedure: Right/Left Heart Cath and Coronary Angiography;  Surgeon: Maude JAYSON Emmer, MD;  Location: [facility] INVASIVE CV LAB;  Service: Cardiovascular;  Laterality: N/A;   INCISION AND DRAINAGE Left 09/05/2023   Procedure: INCISION AND DRAINAGE WITH 3RD TOE METATARSAL RESECTION LEFT FOOT AND 4TH TOE PROXIMAL PHALANX RESECTION;  Surgeon: Malvin Marsa FALCON, DPM;  Location: [facility] OR;  Service: Orthopedics/Podiatry;  Laterality: Left;  I&D with possible metatarsal resection, left foot   IR FLUORO GUIDE CV LINE LEFT  07/21/2022   IR FLUORO GUIDE CV LINE LEFT  07/21/2022   IR FLUORO GUIDE CV LINE RIGHT  10/11/2019   IR REMOVAL TUN CV CATH W/O FL  07/17/2022   IR REMOVAL TUN CV CATH W/O FL  08/18/2022   IR US  GUIDE VASC ACCESS LEFT  07/21/2022   IR US  GUIDE VASC ACCESS LEFT  07/21/2022   IR US  GUIDE VASC ACCESS RIGHT  10/11/2019   IRRIGATION AND DEBRIDEMENT FOOT Left 10/30/2023   Procedure: IRRIGATION AND DEBRIDEMENT WOUND;  Surgeon: Malvin Marsa FALCON, DPM;  Location: [facility] OR;  Service: Orthopedics/Podiatry;  Laterality: Left;   RIGHT/LEFT HEART CATH AND CORONARY ANGIOGRAPHY N/A 02/02/2024   Procedure: RIGHT/LEFT HEART CATH AND CORONARY ANGIOGRAPHY;  Surgeon: Rolan Ezra RAMAN, MD;  Location: [facility] INVASIVE CV  LAB;  Service: Cardiovascular;  Laterality: N/A;   TEE WITHOUT CARDIOVERSION N/A 07/18/2022   Procedure: TRANSESOPHAGEAL ECHOCARDIOGRAM (TEE);  Surgeon: Alvan Ronal BRAVO, MD;  Location: [facility] ENDOSCOPY;  Service: Cardiovascular;  Laterality: N/A;   THORACOTOMY Left 11/08/2013   Procedure: LEFT THORACOTOMY;  Surgeon: Maude Fleeta Ochoa, MD;  Location: [facility] OR;  Service: Thoracic;  Laterality: Left;   TOOTH EXTRACTION N/A 10/30/2023   Procedure: DENTAL RESTORATION/EXTRACTIONS;  Surgeon: Sheryle Hamilton, DMD;  Location: [facility] OR;  Service: Oral Surgery;  Laterality: N/A;   TRANSESOPHAGEAL ECHOCARDIOGRAM (CATH LAB) N/A 10/29/2023   Procedure: TRANSESOPHAGEAL ECHOCARDIOGRAM;  Surgeon: Rolan Ezra RAMAN, MD;  Location: [facility] INVASIVE CV LAB;  Service: Cardiovascular;  Laterality: N/A;   TUNNELLED CATHETER EXCHANGE N/A 01/21/2024   Procedure: TUNNELLED CATHETER EXCHANGE;  Surgeon: Norine Manuelita LABOR, MD;  Location: [facility] INVASIVE CV LAB;  Service: Cardiovascular;  Laterality: N/A;   TUNNELLED CATHETER EXCHANGE N/A 02/10/2024   Procedure: TUNNELLED CATHETER EXCHANGE;  Surgeon: Gretta Bruckner PARAS, MD;  Location: [facility] LAB;  Service: Cardiovascular;  Laterality: N/A;   TUNNELLED CATHETER EXCHANGE Left 03/04/2024   Procedure: TUNNELLED CATHETER EXCHANGE;  Surgeon: Gretta Bruckner PARAS, MD;  Location: [facility];  Service: Cardiovascular;  Laterality: Left;   Social History:  reports that he has never smoked. He has never used smokeless tobacco. He reports current drug use. Drug: Marijuana. He reports that he does not drink alcohol.  Allergies  Allergen Reactions   Acyclovir And Related Hives   Imdur  [Isosorbide  Nitrate]     Headaches from nitrate    Family History  Problem Relation Age of Onset   Diabetes Mellitus II Mother        died @ 59.   Gastric cancer Mother    CAD Father        died @ 55.   Heart attack Father    Congestive Heart Failure Father    Diabetes Mellitus II Sister    CAD Sister        s/p PCI -  age 34.    Prior to Admission medications   Medication Sig Start Date End Date Taking? Authorizing Provider  Accu-Chek Softclix Lancets lancets Use as directed four times daily. 10/15/23     acetaminophen  (TYLENOL ) 500 MG tablet Take 1,000 mg by mouth every 6 (six) hours as needed for moderate pain.    [provider]  atorvastatin  (LIPITOR ) 80 MG tablet Take 1 tablet (80 mg total) by mouth daily. 01/20/24     blood glucose meter kit and supplies KIT Use to check blood sugar up to four times daily as directed. 10/15/23   Masters, Katie, DO  carvedilol  (COREG ) 25 MG tablet Take 2 tablets (50 mg total) by mouth 2 (two) times daily. 10/15/23   Masters, Izetta, DO  carvedilol  (COREG ) 25 MG tablet Take 1 tablet by mouth twice a day with meals Patient not taking: Reported on 04/05/2024 03/23/24     doxycycline  (VIBRA -TABS) 100 MG tablet Take 1 tablet (100 mg total) by mouth every 12 (twelve) hours with food for 7 days. 04/13/24   Macel Jayson PARAS, MD  glucose blood (ACCU-CHEK GUIDE TEST) test strip Use as directed four times daily. 10/15/23     hydrALAZINE  (APRESOLINE ) 50 MG tablet Take 1 tablet by mouth 3 times a day 01/04/24     insulin  aspart (NOVOLOG  FLEXPEN) 100 UNIT/ML FlexPen Inject 15 Units into the skin 3 (three) times daily as needed for high blood sugar. 02/05/24   Tobie Gaines, DO  insulin  glargine-yfgn (SEMGLEE ) 100 UNIT/ML Pen Inject 15 Units into the skin daily. Patient taking differently: Inject 15 Units into the skin every evening. 11/01/23   Masters, Izetta, DO  methocarbamol  (ROBAXIN ) 500 MG tablet Take 1 tablet (500 mg total) by mouth daily as needed. 01/26/24     multivitamin (RENA-VIT) TABS tablet Take 1 tablet by mouth daily.    [provider]  nitroGLYCERIN  (NITROSTAT ) 0.4 MG SL tablet Place 1 tablet (0.4 mg total) under the tongue every 5 (five) minutes x 3 doses as needed within 15 minutes. If chest pain does not resolve, seek medical attention. 11/10/23   Clegg, Amy D, NP   ondansetron  (ZOFRAN ) 4 MG tablet Take 1 tablet (4 mg total) by mouth daily as needed. 11/04/23     pantoprazole  (PROTONIX ) 20 MG tablet Take 1 tablet (20 mg total) by mouth daily as directed for reflux. 02/03/24     pantoprazole  (PROTONIX ) 40 MG tablet Take 1 tablet (40 mg total) by mouth daily. 01/01/24 12/31/24  Alexander-Savino, [place], MD  sevelamer  carbonate (RENVELA ) 800 MG tablet Take 3 tablets (2,400 mg total) by mouth 3 (three) times daily with meals  11/01/23   Masters, Izetta, DO  Tenapanor  HCl, CKD, (XPHOZAH ) 30 MG TABS Take 1 tablet by mouth twice a day as directed Take 1 in the morning and 1 at night 02/11/24       Physical Exam: Vitals:   05/11/24 1129 05/11/24 1520  BP: (!) 120/53 105/66  Pulse: (!) 106 95  Resp: 18 14  Temp: 98.8 F (37.1 C) (!) 100.4 F (38 C)  TempSrc:  Oral  SpO2: 97% 94%   Physical Exam Vitals and nursing note reviewed.  Constitutional:      Appearance: He is obese.  HENT:     Head: Normocephalic and atraumatic.     Nose: Nose normal.     Mouth/Throat:     Mouth: Mucous membranes are moist.  Eyes:     Comments: Pale conjunctiva  Cardiovascular:     Rate and Rhythm: Normal rate and regular rhythm.  Pulmonary:     Effort: Pulmonary effort is normal.     Breath sounds: Normal breath sounds.  Abdominal:     General: Abdomen is flat. Bowel sounds are normal.     Palpations: Abdomen is soft.  Musculoskeletal:     Comments: Ulcer involving the plantar surface of the left foot with purulent drainage  Skin:    General: Skin is warm and dry.  Neurological:     General: No focal deficit present.     Mental Status: He is alert and oriented to person, place, and time.  Psychiatric:        Mood and Affect: Mood normal.     Data Reviewed: Data Reviewed: Relevant notes from primary care and specialist visits, past discharge summaries as available in EHR, including Care Everywhere. Prior diagnostic testing as pertinent to current admission  diagnoses Updated medications and problem lists for reconciliation ED course, including vitals, labs, imaging, treatment and response to treatment Triage notes, nursing and pharmacy notes and ED provider's notes Notable results as noted in HPI Sodium 132, potassium 4.1, chloride 90, bicarb 28, glucose 195, BUN 25, creatinine 1.47, calcium  8.8, total protein 8.1, AST 21, ALT 17, lactic acid 1.0, white count 16.1 with a left shift, hemoglobin 11.3 36.7, platelet count 417 Labs reviewed  Assessment and Plan: * Osteomyelitis (HCC) Patient presented for evaluation of a nonhealing left plantar wound with increased purulence and now has a low-grade fever and leukocytosis with a left shift MRI of the left foot shows marrow signal abnormality of the second metatarsal head and neck and the second proximal phalanx with a small second MTP joint effusion is most compatible with acute osteomyelitis/septic arthritis. Will consult podiatry Place patient empirically on vancomycin  and Unasyn    Diabetic foot ulcer (HCC) Treatment as outlined in 1  Diabetes mellitus with ESRD (end-stage renal disease) (HCC) Patient has insulin -dependent diabetes mellitus type 2 with complications of end-stage renal disease on hemodialysis Dialysis days are Monday/Wednesday/Friday Continue long-acting insulin , decrease dose to 7 units (one half of his scheduled dose) since patient to be n.p.o. past midnight for procedure in a.m. Sliding scale coverage Maintain consistent carbohydrate diet  ESRD (end stage renal disease) (HCC) Dialysis days are Monday/Wednesday/Friday Nephrology consult for renal replacement therapy  Anemia in chronic kidney disease H&H is stable Monitor closely during this hospitalization   Chronic combined systolic and diastolic CHF (congestive heart failure) (HCC) Last known LVEF of 35% from a 2D echocardiogram which was done 02/25 Not acutely exacerbated Continue renal replacement therapy for  volume status management Continue carvedilol  and hydralazine   Primary hypertension Continue carvedilol  and hydralazine  Blood pressure is stable  Obesity, Class I, BMI 30-34.9 Complicates overall prognosis and care Lifestyle modification and exercise has been discussed with patient in detail      Advance Care Planning:   Code Status: Full Code   Consults: Podiatry, nephrology  Family Communication: Plan of care was discussed with patient at the bedside.  All questions and concerns have been addressed.  He verbalizes understanding and agrees to the plan.  Severity of Illness: The appropriate patient status for this patient is INPATIENT. Inpatient status is judged to be reasonable and necessary in order to provide the required intensity of service to ensure the patient's safety. The patient's presenting symptoms, physical exam findings, and initial radiographic and laboratory data in the context of their chronic comorbidities is felt to place them at high risk for further clinical deterioration. Furthermore, it is not anticipated that the patient will be medically stable for discharge from the hospital within 2 midnights of admission.   * I certify that at the point of admission it is my clinical judgment that the patient will require inpatient hospital care spanning beyond 2 midnights from the point of admission due to high intensity of service, high risk for further deterioration and high frequency of surveillance required.*  Author: Aimee Somerset, MD 05/11/2024 4:35 PM  For on call review [email or web address].

## 2024-05-12 ENCOUNTER — Inpatient Hospital Stay (HOSPITAL_COMMUNITY): Admitting: Certified Registered Nurse Anesthetist

## 2024-05-12 ENCOUNTER — Encounter (HOSPITAL_COMMUNITY): Payer: Self-pay | Admitting: Internal Medicine

## 2024-05-12 ENCOUNTER — Encounter (HOSPITAL_COMMUNITY)
Admission: EM | Disposition: A | Discharge status: Home / Self Care | Payer: Self-pay | Source: Ambulatory Visit | Attending: Family Medicine

## 2024-05-12 ENCOUNTER — Inpatient Hospital Stay (HOSPITAL_COMMUNITY)

## 2024-05-12 ENCOUNTER — Other Ambulatory Visit: Payer: Self-pay

## 2024-05-12 DIAGNOSIS — M869 Osteomyelitis, unspecified: Secondary | ICD-10-CM

## 2024-05-12 DIAGNOSIS — I120 Hypertensive chronic kidney disease with stage 5 chronic kidney disease or end stage renal disease: Secondary | ICD-10-CM | POA: Diagnosis not present

## 2024-05-12 DIAGNOSIS — I428 Other cardiomyopathies: Secondary | ICD-10-CM | POA: Diagnosis not present

## 2024-05-12 DIAGNOSIS — Z992 Dependence on renal dialysis: Secondary | ICD-10-CM | POA: Diagnosis not present

## 2024-05-12 DIAGNOSIS — E1122 Type 2 diabetes mellitus with diabetic chronic kidney disease: Secondary | ICD-10-CM | POA: Diagnosis not present

## 2024-05-12 DIAGNOSIS — I96 Gangrene, not elsewhere classified: Secondary | ICD-10-CM | POA: Diagnosis not present

## 2024-05-12 DIAGNOSIS — I472 Ventricular tachycardia, unspecified: Secondary | ICD-10-CM | POA: Diagnosis not present

## 2024-05-12 DIAGNOSIS — I132 Hypertensive heart and chronic kidney disease with heart failure and with stage 5 chronic kidney disease, or end stage renal disease: Secondary | ICD-10-CM | POA: Diagnosis not present

## 2024-05-12 DIAGNOSIS — Z1152 Encounter for screening for COVID-19: Secondary | ICD-10-CM | POA: Diagnosis not present

## 2024-05-12 DIAGNOSIS — E1169 Type 2 diabetes mellitus with other specified complication: Secondary | ICD-10-CM

## 2024-05-12 DIAGNOSIS — L03032 Cellulitis of left toe: Secondary | ICD-10-CM | POA: Diagnosis not present

## 2024-05-12 DIAGNOSIS — Z794 Long term (current) use of insulin: Secondary | ICD-10-CM

## 2024-05-12 DIAGNOSIS — Z9889 Other specified postprocedural states: Secondary | ICD-10-CM | POA: Diagnosis not present

## 2024-05-12 DIAGNOSIS — D631 Anemia in chronic kidney disease: Secondary | ICD-10-CM | POA: Diagnosis not present

## 2024-05-12 DIAGNOSIS — R569 Unspecified convulsions: Secondary | ICD-10-CM | POA: Diagnosis not present

## 2024-05-12 DIAGNOSIS — J45909 Unspecified asthma, uncomplicated: Secondary | ICD-10-CM | POA: Diagnosis not present

## 2024-05-12 DIAGNOSIS — I10 Essential (primary) hypertension: Secondary | ICD-10-CM

## 2024-05-12 DIAGNOSIS — M86172 Other acute osteomyelitis, left ankle and foot: Secondary | ICD-10-CM | POA: Diagnosis not present

## 2024-05-12 DIAGNOSIS — I70269 Atherosclerosis of native arteries of extremities with gangrene, unspecified extremity: Secondary | ICD-10-CM

## 2024-05-12 DIAGNOSIS — N186 End stage renal disease: Secondary | ICD-10-CM | POA: Diagnosis not present

## 2024-05-12 DIAGNOSIS — N250 Renal osteodystrophy: Secondary | ICD-10-CM | POA: Diagnosis not present

## 2024-05-12 HISTORY — PX: METATARSAL HEAD EXCISION: SHX5027

## 2024-05-12 LAB — COMPREHENSIVE METABOLIC PANEL WITH GFR
ALT: 19 U/L (ref 0–44)
AST: 23 U/L (ref 15–41)
Albumin: 2.3 g/dL — ABNORMAL LOW (ref 3.5–5.0)
Alkaline Phosphatase: 52 U/L (ref 38–126)
Anion gap: 24 — ABNORMAL HIGH (ref 5–15)
BUN: 35 mg/dL — ABNORMAL HIGH (ref 6–20)
CO2: 21 mmol/L — ABNORMAL LOW (ref 22–32)
Calcium: 9.2 mg/dL (ref 8.9–10.3)
Chloride: 91 mmol/L — ABNORMAL LOW (ref 98–111)
Creatinine, Ser: 9.46 mg/dL — ABNORMAL HIGH (ref 0.61–1.24)
GFR, Estimated: 6 mL/min — ABNORMAL LOW (ref >60)
Glucose, Bld: 138 mg/dL — ABNORMAL HIGH (ref 70–99)
Potassium: 4.2 mmol/L (ref 3.5–5.1)
Sodium: 136 mmol/L (ref 135–145)
Total Bilirubin: 0.6 mg/dL (ref 0.0–1.2)
Total Protein: 8.1 g/dL (ref 6.5–8.1)

## 2024-05-12 LAB — GLUCOSE, CAPILLARY
Glucose-Capillary: 155 mg/dL — ABNORMAL HIGH (ref 70–99)
Glucose-Capillary: 128 mg/dL — ABNORMAL HIGH (ref 70–99)
Glucose-Capillary: 121 mg/dL — ABNORMAL HIGH (ref 70–99)
Glucose-Capillary: 197 mg/dL — ABNORMAL HIGH (ref 70–99)
Glucose-Capillary: 199 mg/dL — ABNORMAL HIGH (ref 70–99)

## 2024-05-12 LAB — TROPONIN I (HIGH SENSITIVITY)
Troponin I (High Sensitivity): 45 ng/L — ABNORMAL HIGH (ref <18)
Troponin I (High Sensitivity): 43 ng/L — ABNORMAL HIGH (ref <18)

## 2024-05-12 LAB — VAS US ABI WITH/WO TBI
Left ABI: 1.34
Right ABI: 1.44

## 2024-05-12 LAB — HEPATITIS B SURFACE ANTIGEN: Hepatitis B Surface Ag: NONREACTIVE

## 2024-05-12 SURGERY — EXCISION, METATARSAL BONE, HEAD
Anesthesia: Monitor Anesthesia Care | Site: Toe | Laterality: Left

## 2024-05-12 MED ORDER — ORAL CARE MOUTH RINSE: 15.0000 mL | Freq: Once | OROMUCOSAL | Status: AC

## 2024-05-12 MED ORDER — PHENYLEPHRINE 80 MCG/ML (10ML) SYRINGE FOR IV PUSH (FOR BLOOD PRESSURE SUPPORT)
PREFILLED_SYRINGE | INTRAVENOUS | Status: DC | PRN
  Administered 2024-05-12 (×4): 80 ug via INTRAVENOUS

## 2024-05-12 MED ORDER — VANCOMYCIN HCL 500 MG IV SOLR
INTRAVENOUS | Status: DC | PRN
  Administered 2024-05-12: 500 mg

## 2024-05-12 MED ORDER — CHLORHEXIDINE GLUCONATE 0.12 % MT SOLN
OROMUCOSAL | Status: AC
  Administered 2024-05-12: 15 mL via OROMUCOSAL
  Filled 2024-05-12: qty 15

## 2024-05-12 MED ORDER — MIDAZOLAM HCL 2 MG/2ML IJ SOLN
INTRAMUSCULAR | Status: AC
  Filled 2024-05-12: qty 2

## 2024-05-12 MED ORDER — PROPOFOL 10 MG/ML IV BOLUS
INTRAVENOUS | Status: AC
  Filled 2024-05-12: qty 20

## 2024-05-12 MED ORDER — SODIUM CHLORIDE 0.9 % IV SOLN: Freq: Once | INTRAVENOUS | Status: DC

## 2024-05-12 MED ORDER — SODIUM CHLORIDE 0.9 % IV SOLN: INTRAVENOUS | Status: DC

## 2024-05-12 MED ORDER — VANCOMYCIN HCL 500 MG IV SOLR
INTRAVENOUS | Status: AC
  Filled 2024-05-12: qty 10

## 2024-05-12 MED ORDER — MIDAZOLAM HCL 2 MG/2ML IJ SOLN
INTRAMUSCULAR | Status: DC | PRN
  Administered 2024-05-12: 2 mg via INTRAVENOUS

## 2024-05-12 MED ORDER — ACETAMINOPHEN 325 MG PO TABS
650.0000 mg | ORAL_TABLET | Freq: Four times a day (QID) | ORAL | Status: DC | PRN
  Administered 2024-05-12 – 2024-05-13 (×2): 650 mg via ORAL
  Filled 2024-05-12: qty 2

## 2024-05-12 MED ORDER — TOBRAMYCIN SULFATE 1.2 G IJ SOLR
INTRAMUSCULAR | Status: AC
Start: 2024-05-12 — End: 2024-05-12
  Filled 2024-05-12: qty 1.2

## 2024-05-12 MED ORDER — HYDROMORPHONE HCL 1 MG/ML IJ SOLN: 0.5000 mg | INTRAMUSCULAR | Status: DC | PRN

## 2024-05-12 MED ORDER — PROPOFOL 500 MG/50ML IV EMUL
INTRAVENOUS | Status: DC | PRN
  Administered 2024-05-12: 150 ug/kg/min via INTRAVENOUS

## 2024-05-12 MED ORDER — INSULIN ASPART 100 UNIT/ML IJ SOLN: 0.0000 [IU] | INTRAMUSCULAR | Status: DC | PRN

## 2024-05-12 MED ORDER — FENTANYL CITRATE (PF) 250 MCG/5ML IJ SOLN
INTRAMUSCULAR | Status: AC
  Filled 2024-05-12: qty 5

## 2024-05-12 MED ORDER — TOBRAMYCIN SULFATE 1.2 G IJ SOLR
INTRAMUSCULAR | Status: DC | PRN
  Administered 2024-05-12: 1.2 g

## 2024-05-12 MED ORDER — LIDOCAINE 2% (20 MG/ML) 5 ML SYRINGE
INTRAMUSCULAR | Status: DC | PRN
  Administered 2024-05-12: 40 mg via INTRAVENOUS

## 2024-05-12 MED ORDER — LIDOCAINE HCL 1 % IJ SOLN
INTRAMUSCULAR | Status: AC
  Filled 2024-05-12: qty 20

## 2024-05-12 MED ORDER — SODIUM CHLORIDE 0.9 % IR SOLN
Status: DC | PRN
  Administered 2024-05-12: 3000 mL

## 2024-05-12 MED ORDER — CHLORHEXIDINE GLUCONATE 0.12 % MT SOLN: 15.0000 mL | Freq: Once | OROMUCOSAL | Status: AC

## 2024-05-12 MED ORDER — OXYCODONE HCL 5 MG PO TABS
5.0000 mg | ORAL_TABLET | ORAL | Status: DC | PRN
  Administered 2024-05-12 (×2): 5 mg via ORAL
  Filled 2024-05-12 (×2): qty 1

## 2024-05-12 MED ORDER — BUPIVACAINE HCL (PF) 0.5 % IJ SOLN
INTRAMUSCULAR | Status: AC
  Filled 2024-05-12: qty 30

## 2024-05-12 MED ORDER — LIDOCAINE HCL (PF) 1 % IJ SOLN
INTRAMUSCULAR | Status: DC | PRN
  Administered 2024-05-12: 10 mL

## 2024-05-12 MED ORDER — PHENYLEPHRINE 80 MCG/ML (10ML) SYRINGE FOR IV PUSH (FOR BLOOD PRESSURE SUPPORT)
PREFILLED_SYRINGE | INTRAVENOUS | Status: AC
  Filled 2024-05-12: qty 10

## 2024-05-12 MED ORDER — FENTANYL CITRATE (PF) 250 MCG/5ML IJ SOLN
INTRAMUSCULAR | Status: DC | PRN
  Administered 2024-05-12: 50 ug via INTRAVENOUS

## 2024-05-12 SURGICAL SUPPLY — 40 items
BLADE AVERAGE 25X9 (BLADE) IMPLANT
BLADE SURG 10 STRL SS (BLADE) ×1 IMPLANT
BLADE SURG 15 STRL LF DISP TIS (BLADE) ×1 IMPLANT
BNDG COHESIVE 3X5 TAN ST LF (GAUZE/BANDAGES/DRESSINGS) ×1 IMPLANT
BNDG COMPR ESMARK 4X3 LF (GAUZE/BANDAGES/DRESSINGS) ×1 IMPLANT
BNDG ELASTIC 2 VLCR STRL LF (GAUZE/BANDAGES/DRESSINGS) IMPLANT
BNDG ELASTIC 3INX 5YD STR LF (GAUZE/BANDAGES/DRESSINGS) ×1 IMPLANT
BNDG ELASTIC 4INX 5YD STR LF (GAUZE/BANDAGES/DRESSINGS) IMPLANT
BNDG GAUZE DERMACEA FLUFF 4 (GAUZE/BANDAGES/DRESSINGS) IMPLANT
CHLORAPREP W/TINT 26 (MISCELLANEOUS) IMPLANT
DRSG ADAPTIC 3X8 NADH LF (GAUZE/BANDAGES/DRESSINGS) IMPLANT
DRSG XEROFORM 1X8 (GAUZE/BANDAGES/DRESSINGS) IMPLANT
ELECTRODE REM PT RTRN 9FT ADLT (ELECTROSURGICAL) ×1 IMPLANT
GAUZE PAD ABD 8X10 STRL (GAUZE/BANDAGES/DRESSINGS) IMPLANT
GAUZE SPONGE 2X2 STRL 8-PLY (GAUZE/BANDAGES/DRESSINGS) IMPLANT
GAUZE SPONGE 4X4 12PLY STRL (GAUZE/BANDAGES/DRESSINGS) ×1 IMPLANT
GAUZE STRETCH 2X75IN STRL (MISCELLANEOUS) ×1 IMPLANT
GAUZE XEROFORM 1X8 LF (GAUZE/BANDAGES/DRESSINGS) ×1 IMPLANT
GLOVE BIO SURGEON STRL SZ7.5 (GLOVE) ×1 IMPLANT
GLOVE BIOGEL PI IND STRL 7.5 (GLOVE) ×1 IMPLANT
GOWN STRL REUS W/ TWL LRG LVL3 (GOWN DISPOSABLE) ×2 IMPLANT
KIT BASIN OR (CUSTOM PROCEDURE TRAY) ×1 IMPLANT
KIT STIMULAN RAPID CURE 5CC (Orthopedic Implant) IMPLANT
NDL HYPO 25X1 1.5 SAFETY (NEEDLE) ×1 IMPLANT
NEEDLE HYPO 25X1 1.5 SAFETY (NEEDLE) ×1 IMPLANT
PACK ORTHO EXTREMITY (CUSTOM PROCEDURE TRAY) ×1 IMPLANT
PACKING GAUZE IODOFORM 1INX5YD (GAUZE/BANDAGES/DRESSINGS) IMPLANT
PAD ABD 8X10 STRL (GAUZE/BANDAGES/DRESSINGS) IMPLANT
PADDING CAST ABS COTTON 4X4 ST (CAST SUPPLIES) ×2 IMPLANT
SET HNDPC FAN SPRY TIP SCT (DISPOSABLE) IMPLANT
SPIKE FLUID TRANSFER (MISCELLANEOUS) IMPLANT
STOCKINETTE 4X48 STRL (DRAPES) IMPLANT
SUT ETHILON 3 0 FSLX (SUTURE) IMPLANT
SUT PROLENE 3 0 PS 2 (SUTURE) IMPLANT
SUT PROLENE 4 0 PS 2 18 (SUTURE) IMPLANT
SYR CONTROL 10ML LL (SYRINGE) ×1 IMPLANT
TUBE CONNECTING 12X1/4 (SUCTIONS) IMPLANT
UNDERPAD 30X36 HEAVY ABSORB (UNDERPADS AND DIAPERS) ×1 IMPLANT
WATER STERILE IRR 1000ML POUR (IV SOLUTION) ×1 IMPLANT
YANKAUER SUCT BULB TIP NO VENT (SUCTIONS) IMPLANT

## 2024-05-12 NOTE — Progress Notes (Signed)
 Orthopedic Tech Progress Note Patient Details:  Jeremy [name] 06/26/1981 [id number]  Ortho Devices Type of Ortho Device: Postop shoe/boot Ortho Device/Splint Location: pt currently has a PO shoe on the LLE, as second shoe for the same foot was not provided      [name] 05/12/2024, 2:07 PM

## 2024-05-12 NOTE — Progress Notes (Signed)
 Received call from telemetry stated they saw slight ST elevation. Patient stated he was awakened from sleep feeling short of breath.Vitals and EKG obtained. On call hospitalist provider notified and aware.   05/12/24 0246 05/12/24 0300 05/12/24 0355  Vitals  Temp 98.6 F (37 C)  --   --   Temp Source Oral  --   --   BP 120/64  --   --   MAP (mmHg) 81  --   --   BP Location Right Arm  --   --   BP Method Automatic  --   --   Patient Position (if appropriate) Lying  --   --   Pulse Rate 95  --   --   Pulse Rate Source Dinamap  --   --   ECG Heart Rate  --  92  --   Resp 17  --   --   Level of Consciousness  Level of Consciousness Alert  --   --   MEWS COLOR  MEWS Score Color Green Green  --   Oxygen Therapy  SpO2 100 %  --   --   O2 Device Room Air  --   --   Pain Assessment  Pain Scale  --   --  0-10  Pain Score  --   --  5  Pain Type  --   --  Acute pain  Pain Location  --   --  Chest  Pain Orientation  --   --  Left;Anterior  Pain Descriptors / Indicators  --   --  Tightness;Heaviness  Pain Intervention(s)  --   --  Medication (See eMAR);MD notified (Comment)  MEWS Score  MEWS Temp 0 0  --   MEWS Systolic 0 0  --   MEWS Pulse 0 0  --   MEWS RR 0 0  --   MEWS LOC 0 0  --   MEWS Score 0 0  --   Provider Notification  Provider Name/Title  --  A Andrez DELENA hipp  Date Provider Notified  --  05/12/24 05/12/24  Time Provider Notified  --  0300 0355  Method of Notification  --  Page Page  Notification Reason  --  New onset of dysrhythmia Other (Comment) (chest tightness/heaviness)  Type of New Onset of Dysrhythmia  --  Other (Comment) (per telemetry slight ST elevation in V lead)  --   Symptoms of New Onset of Dysrhythmia  --  Shortness of breath  --

## 2024-05-12 NOTE — Anesthesia Procedure Notes (Signed)
 Procedure Name: MAC Date/Time: 05/12/2024 12:14 PM  Performed by: Jolynn Mage, CRNAPre-anesthesia Checklist: Patient identified, Emergency Drugs available, Suction available, Timeout performed and Patient being monitored Patient Re-evaluated:Patient Re-evaluated prior to induction Oxygen Delivery Method: Simple face mask

## 2024-05-12 NOTE — Consult Note (Signed)
 PODIATRY CONSULTATION  NAME Jeremy [name] MRN [id number] DOB 1981/04/11 DOA 05/11/2024   Reason for consult: L foot ulceration  Attending/Consulting physician: C. Danford MD  History of present illness: 43 y.o. M with ESRD on HD MWF, CHF EF 35%, HTN, PVD and prior amputations who presented with tunneling nonhealing left plantar foot wound.   MRI in the ER confirmed marrow abnormality of 2nd metatarsal head, second proximal phalanx and septic 2nd MTP joint.  Started on antibiotics and admitted.  I discussed the MRI findings in detail with the patient.  Discussed concern for osteomyelitis in the second toe and second metatarsal.  Will try to salvage the second toe at this point in time however he is aware that he could have amputation of this toe in the future if the wound does not heal or there is worsening of infection.    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 01/2015 Lexiscan  MV: EF 28%, inferior, inferolateral, apical ischemia;  b. 01/2015 Cath: nl cors, PCWP 18 mmHg, CO 9.38 L/min, CI 3.53 L/min/m^2.   CHF (congestive heart failure) (HCC)    Depression    Situational   ESRD (end stage renal disease) (HCC)    [place]   Essential hypertension    Family history of adverse reaction to anesthesia    sister- it was too much for her heart died   Flash pulmonary edema (HCC) 09/18/2022   GERD (gastroesophageal reflux disease)    Hyperlipidemia    Membranous glomerulonephritis    Morbid obesity (HCC)    Nonischemic cardiomyopathy (HCC)    a. 01/2015 Echo: EF 20-25%, diff HK, Gr 2 DD, Triv AI, mildly dil LA and Ao root.   PONV (postoperative nausea and vomiting)    Seizures (HCC) 02/29/2020   electralytes were out of wack.   Type II diabetes mellitus (HCC)    a. 01/2015 HbA1c = 8.9.       Latest Ref  Rng & Units 05/11/2024   12:14 PM 05/11/2024   12:06 PM 02/22/2024    9:06 PM  CBC  WBC 4.0 - 10.5 K/uL  16.1  9.5   Hemoglobin 13.0 - 17.0 g/dL 86.6  88.5  87.9   Hematocrit 39.0 - 52.0 % 39.0  36.7  38.6   Platelets 150 - 400 K/uL  417  312        Latest Ref Rng & Units 05/12/2024    3:41 AM 05/11/2024   12:14 PM 05/11/2024   12:06 PM  BMP  Glucose 70 - 99 mg/dL 861  802  804   BUN 6 - 20 mg/dL 35  24  25   Creatinine 0.61 - 1.24 mg/dL 0.53  2.39  2.52   Sodium 135 - 145 mmol/L 136  134  132   Potassium 3.5 - 5.1 mmol/L 4.2  4.2  4.1   Chloride 98 - 111 mmol/L 91  93  90   CO2 22 - 32 mmol/L 21   28   Calcium  8.9 - 10.3 mg/dL 9.2   8.8       Physical Exam: Lower Extremity Exam  Left foot DP and PT pulses palpable  Large ulceration with necrotic and fibropurulent drainage from the plantar aspect of the second MPJ.  Wound does probe down to the second metatarsal head.  Sensation is absent to the light touch to forefoot level  Prior third toe amputation with partial third metatarsal resection as well edema of the foot is noted     ASSESSMENT/PLAN OF CARE 43 y.o. male with PMHx significant for  ESRD on HD MWF, CHF EF 35%, HTN, PVD  with osteomyelitis of the second MPJ with septic joint with plantar ulceration underlying the second MPJ  WBC 16.1  MRI left foot without contrast: Soft tissue ulceration plantar second MPJ with concern for underlying osteomyelitis of the second proximal phalanx and second metatarsal head and neck  He had ABI and PVR studies indicate noncompressible vessels, does have multiphasic waveforms.  - NPO for OR today for left second MPJ resection possible antibiotic beads.  Patient agrees to proceed -Will assess bleeding intraoperatively if there is concern for diminished bleeding will engage vascular as needed - Continue IV abx broad spectrum pending further culture data - Anticoagulation: Hold pending OR okay to resume thereafter - Wound care: Leave  surgical dressings clean dry intact - WB status: Weightbearing as tolerated in postop shoe following surgery - Will continue to follow   Thank you for the consult.  Please contact me directly with any questions or concerns.           [name], DPM [facility] / [facility]    [address] [place], [place] [address]                Office [phone]  Fax [phone]

## 2024-05-12 NOTE — Plan of Care (Signed)
   Problem: Coping: Goal: Ability to adjust to condition or change in health will improve Outcome: Progressing   Problem: Nutritional: Goal: Maintenance of adequate nutrition will improve Outcome: Progressing

## 2024-05-12 NOTE — Progress Notes (Signed)
  Progress Note   Patient: Jeremy [name] FMW:[id number] DOB: July 30, 1981 DOA: 05/11/2024     1 DOS: the patient was seen and examined on 05/12/2024        Brief hospital course: 43 y.o. M with ESRD on HD MWF, CHF EF 35%, HTN, PVD and prior amputations who presented with tunneling nonhealing left plantar foot wound.  MRI in the ER confirmed marrow abnormality of 2nd metatarsal head, second proximal phalanx and septic 2nd MTP joint.  Started on antibiotics and admitted.     Assessment and Plan: * Osteomyelitis (HCC) Diabetic foot infection - Continue antibiotics - ABI pending - Consult Orthpedics   Chest pain Overnight noted to have brief nonsustained ventricular tachycardia.  Also observed by remote monitoring to have possible ST change on tele. On follow up questioning patient thought he may have chest tightness.     This tightness is resolved.  Follow up ECG ruled out ST changes, showed no change from June, and his troponins are normal in setting of ESRD.  Overall he has elevated cardiac risk due to known vascular disease, known cardiomyopathy and ESRD, but appears optimized and has no evidence of active cardiac symptoms at this time.  Diabetes mellitus with ESRD (end-stage renal disease) (HCC) A1c 6.4% - Continue SS corrections - Resume normal dose glargine tonight post op  ESRD (end stage renal disease) (HCC) - Nephrology consulted - Continue home BMD  Anemia in chronic kidney disease Hgb stable relative to baseline  Chronic combined systolic and diastolic CHF (congestive heart failure) (HCC) Primary hypertension EF Feb 35%, appears volume optimized at this time. BP normal - Continue Lipitor , Coreg , hydralazine   Obesity, Class I, BMI 30-34.9          Subjective: Chest pain resolved.  Foot feels okay.  No fever, no vomiting, no swelling, no dyspnea.     Physical Exam: BP (!) 124/59 (BP Location: Right Arm)   Pulse 95   Temp 98.5 F (36.9 C)   Resp 18    SpO2 95%   General: Pt is alert, awake, not in acute distress Cardiovascular: RRR, nl S1-S2, no murmurs appreciated.   No LE edema.   Respiratory: Normal respiratory rate and rhythm.  CTAB without rales or wheezes. Abdominal: Abdomen soft and non-tender.  No distension or HSM.   MSK: The left foot has no significant redness of swelling, there isa  deep plantar wound, tuneling. With some drainage. Neuro/Psych: Strength symmetric in upper and lower extremities.  Judgment and insight appear normal.   Data Reviewed: BMP c/w chronic ESRD CBC unreamarkable, leukocytosis noted Discussed with podiatry  Family Communication:     Disposition: Status is: Inpatient         Author: Lonni SHAUNNA Dalton, MD 05/12/2024 8:49 AM  For on call review [email or web address].

## 2024-05-12 NOTE — Op Note (Signed)
 Full Operative Report  Date of Operation: 2:26 PM, 05/12/2024   Patient: Jeremy [name] - 43 y.o. male  Surgeon: Malvin Marsa FALCON, DPM   Assistant: None  Diagnosis: Osteomyelitis of left foot  Procedure:  1.  Irrigation and debridement of second MPJ with resection proximal phalanx and second metatarsal head and neck, left foot    Anesthesia: Monitor Anesthesia Care  No responsible provider has been recorded for the case.  Anesthesiologist: Erma Thom SAUNDERS, MD CRNA: Kufeji, Omotara J, CRNA; Jolynn Mage, CRNA   Estimated Blood Loss: 50 mL  Hemostasis: 1) Anatomical dissection, mechanical compression, electrocautery 2) no tourniquet was used during procedure  Implants: Implant Name Type Inv. Item Serial No. Manufacturer Lot No. LRB No. Used Action  KIT STIMULAN RAPID CURE 5CC - [id number] Orthopedic Implant KIT STIMULAN RAPID CURE 5CC  [employer] [id number] Left 1 Wasted    Materials: Prolene 3-0, 1 inch iodoform packing  Injectables: 1) Pre-operatively: 10 cc of 50:50 mixture 1%lidocaine  plain and 0.5% marcaine  plain 2) Post-operatively: None   Specimens: - Pathology: Second proximal phalanx and second metatarsal head/neck for pathology - Microbiology: Deep tissue culture from plantar ulceration   Antibiotics: IV antibiotics given per schedule on the floor  Drains: None  Complications: Patient tolerated the procedure well without complication.   Operative findings: As below in detailed report  Indications for Procedure: Jeremy [name] presents to Malvin Marsa FALCON, DPM with a chief complaint of draining ulceration plantar aspect of MPJ with direct extension to the second metatarsal head.  MRI with concern for underlying osteomyelitis second metatarsal head neck and proximal phalanx with associated septic arthritis of the second MPJ.  The patient has failed conservative treatments of various modalities. At this time the patient has elected to  proceed with surgical correction. All alternatives, risks, and complications of the procedures were thoroughly explained to the patient. Patient exhibits appropriate understanding of all discussion points and informed consent was signed and obtained in the chart with no guarantees to surgical outcome given or implied.  Description of Procedure: Patient was brought to the operating room. Patient remained on their hospital bed in the supine position. A surgical timeout was performed and all members of the operating room, the procedure, and the surgical site were identified. anesthesia occurred as per anesthesia record. Local anesthetic as previously described was then injected about the operative field in a local infiltrative block.  The operative lower extremity as noted above was then prepped and draped in the usual sterile manner. The following procedure then began.  Attention was first directed to the ulceration of the plantar aspect of the second MPJ.  Debridement excisionally was carried out with rongeur to the level of bone.  Rongeur was used to harvest a deep tissue culture from the plantar ulceration.  This was sent for aerobic anaerobic culture.  Wound was further debrided excisionally with rongeur and 10 blade scalpel to the level of sec metatarsal head.  There was necrotic and fibrotic material expressed from the ulceration and there was small amount of purulence drained from the second MPJ.  Attention was then directed dorsally to the dorsal aspect of the second MPJ.  A longitudinal incision was made approximately 5 cm in length overlying the joint.  Dissection was carried down to bone level and small amount of purulence was noted about the joint.  Next the proximal phalanx was disarticulated at the PIPJ and MPJ.  This was then removed in toto and passed the back table was sent  for pathology.  The head and neck second metatarsal was then exposed with 15 blade.  Sagittal saw was then used to make  transverse osteotomy through the neck of the second metatarsal.  2nd metatarsal head neck was then resected and passed off to get sent for pathology.  Next the surgical site was irrigated thoroughly with 3 L of sterile saline via power pulse lavage.  At completion of the procedure there was no further purulence or necrotic or infected tissues noted in the wound bed.  Next the dorsal incision was closed under minimal tension with 3-0 Prolene.  The plantar wound was left open to continue to drain and was packed with 1 inch iodoform packing strip.  The surgical site was then dressed with Xeroform 4 x 4 ABD pad Kerlix Ace. The patient tolerated both the procedure and anesthesia well with vital signs stable throughout. The patient was transferred in good condition and all vital signs stable  from the OR to recovery under the discretion of anesthesia.  Condition: Vital signs stable, neurovascular status unchanged from preoperative   Surgical plan:  Follow-up surgical pathology and cultures.  I am hopeful for complete resection of the osseous infection however cannot be certain all was removed from the second metatarsal neck and therefore recommend ID consultation for antibiotic therapy.  Patient will need long-term wound care for the plantar ulceration hopefully removing the infected tissue underlying and removing the pressure caused by the metatarsal head will aid in healing this area.  The patient will be weightbearing in a postop shoe to the operative limb until further instructed. The dressing is to remain clean, dry, and intact. Will continue to follow unless noted elsewhere.   Marsa Honour, DPM [facility]

## 2024-05-12 NOTE — Progress Notes (Signed)
 History and Physical Interval Note:  05/12/2024 11:43 AM  Jeremy [name]  has presented today for surgery, with the diagnosis of osteomyelitis of the left 2nd proximal phalanx and metatarsal head.  The various methods of treatment have been discussed with the patient and family. After consideration of risks, benefits and other options for treatment, the patient has consented to   Procedure(s) with comments: EXCISION, METATARSAL BONE, HEAD (Left) - Left 2nd MPJ resection, possible abx beads, possible 2nd toe amputation as a surgical intervention.  The patient's history has been reviewed, patient examined, no change in status, stable for surgery.  I have reviewed the patient's chart and labs.  Questions were answered to the patient's satisfaction.     Jeremy [name] Saiquan Hands

## 2024-05-12 NOTE — Anesthesia Preprocedure Evaluation (Signed)
 Anesthesia Evaluation  Patient identified by MRN, date of birth, ID band Patient awake    Reviewed: Allergy & Precautions, NPO status , Patient's Chart, lab work & pertinent test results  History of Anesthesia Complications (+) PONV, Family history of anesthesia reaction and history of anesthetic complications (reports sister died)  Airway Mallampati: III  TM Distance: >3 FB Neck ROM: Full    Dental  (+) Dental Advisory Given   Pulmonary neg shortness of breath, asthma (does not use inhalers) , neg sleep apnea, neg COPD, Recent URI  (flu a month ago), Resolved   Pulmonary exam normal breath sounds clear to auscultation       Cardiovascular hypertension, Pt. on home beta blockers and Pt. on medications (-) angina +CHF (EF 35%)  (-) Past MI, (-) Cardiac Stents and (-) CABG (-) dysrhythmias + Valvular Problems/Murmurs (severe) AI  Rhythm:Regular Rate:Normal  HLD  TEE 10/29/2023: IMPRESSIONS    1. Left ventricular ejection fraction, by estimation, is 35%. The left  ventricle has moderately decreased function. The left ventricle  demonstrates global hypokinesis. The left ventricular internal cavity size  was mildly to moderately dilated.   2. Right ventricular systolic function is mildly reduced. The right  ventricular size is normal.   3. No left atrial/left atrial appendage thrombus was detected.   4. No PFO/ASD by color doppler.   5. There is a mobile vegetation that appears calcified on the right cusp,  0.9 cm. There is a smaller mobile vegetation on the left cusp. The right  and noncoronary cusps do not fully coapt. I do not see a definite  perforation. The aortic valve is  tricuspid. Aortic valve regurgitation is severe. No aortic stenosis is  present. Difficult to assess but there does appear to be holodiastolic  flow reversal in the proximal descending thoracic aorta.   6. The mitral valve is normal in structure. Trivial  mitral valve  regurgitation. No evidence of mitral stenosis.   7. The dialysis catheter extends into the right atrium, no vegetation  noted.   8. 3D performed of the aortic valve.     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  Reproductive/Obstetrics                              Anesthesia Physical Anesthesia Plan  ASA: 4  Anesthesia Plan: MAC   Post-op Pain Management:    Induction: Intravenous  PONV Risk Score and Plan: 3 and Ondansetron , Dexamethasone  and Treatment may vary due to age or medical condition  Airway Management Planned: Natural Airway and Simple Face Mask  Additional Equipment: None  Intra-op Plan:   Post-operative Plan:   Informed Consent: I have reviewed the patients History and Physical, chart, labs and discussed the procedure including the risks, benefits and alternatives for the proposed anesthesia with the patient or authorized representative who has indicated his/her understanding and acceptance.     Dental advisory given  Plan Discussed with: CRNA and Anesthesiologist  Anesthesia Plan Comments:         Anesthesia Quick Evaluation

## 2024-05-12 NOTE — Transfer of Care (Signed)
 Immediate Anesthesia Transfer of Care Note  Patient: Jeremy [name]  Procedure(s) Performed: EXCISION, METATARSAL BONE, HEAD AND SECOND TOE AMPUTATION (Left: Toe)  Patient Location: PACU  Anesthesia Type:MAC  Level of Consciousness: awake, alert , and patient cooperative  Airway & Oxygen Therapy: Patient Spontanous Breathing  Post-op Assessment: Report given to RN and Post -op Vital signs reviewed and stable  Post vital signs: Reviewed and stable  Last Vitals:  Vitals Value Taken Time  BP 99/49 05/12/24 13:06  Temp    Pulse 94 05/12/24 13:11  Resp 30 05/12/24 13:11  SpO2 94 % 05/12/24 13:11  Vitals shown include unfiled device data.  Last Pain:  Vitals:   05/12/24 1121  TempSrc:   PainSc: 0-No pain         Complications: No notable events documented.

## 2024-05-12 NOTE — Consult Note (Signed)
 [facility] Renal Consultation Note    Indication for Consultation:  Management of ESRD/hemodialysis, anemia, hypertension/volume, and secondary hyperparathyroidism.  HPI: Jeremy [name] is a 43 y.o. male with ESRD, HTN, T2DM who was admitted with L foot wound.  Presented to ED from [facility] per MD rec for MRI - noted to have new plantar ulceration with purulent drainage. Vitals initially ok, then developed low grade fever - T100. Labs with K 4.2, LA 0.7 -> 1, WBC 16.1. 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  Seen in room - foot is bandaged. He is feeling well otherwise. No fever, chills. No CP, dyspnea, abd pain, N/V/D.  Dialyzes on MWF at [facility] clinic - will be due for HD tomorrow. Uses L TDC as access for now.    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 01/2015 Lexiscan  MV: EF 28%, inferior, inferolateral, apical ischemia;  b. 01/2015 Cath: nl cors, PCWP 18 mmHg, CO 9.38 L/min, CI 3.53 L/min/m^2.   CHF (congestive heart failure) (HCC)    Depression    Situational   ESRD (end stage renal disease) (HCC)    [facility]- [place]   Essential hypertension    Family history of adverse reaction to anesthesia    sister- it was too much for her heart died   Flash pulmonary edema (HCC) 09/18/2022   GERD (gastroesophageal reflux disease)    Hyperlipidemia    Membranous glomerulonephritis    Morbid obesity (HCC)    Nonischemic cardiomyopathy (HCC)    a. 01/2015 Echo: EF 20-25%, diff HK, Gr 2 DD, Triv AI, mildly dil LA and Ao root.   PONV (postoperative nausea and vomiting)    Seizures (HCC) 02/29/2020   electralytes were out of wack.   Type II diabetes  mellitus (HCC)    a. 01/2015 HbA1c = 8.9.   Past Surgical History:  Procedure Laterality Date   A/V FISTULAGRAM N/A 08/12/2021   Procedure: A/V FISTULAGRAM;  Surgeon: Sheree Penne Bruckner, MD;  Location: [facility] INVASIVE CV LAB;  Service: Cardiovascular;  Laterality: N/A;   AMPUTATION Left 10/28/2023   Procedure: AMPUTATION RAY FOOT;  Surgeon: Malvin Marsa FALCON, DPM;  Location: [facility] OR;  Service: Orthopedics/Podiatry;  Laterality: Left;  Partial 3rd ray resection, I&D, antibiotic beads, wound vac   AV FISTULA PLACEMENT Right 12/18/2020   Procedure: RIGHT ARM RADIOCEPHALIC  ARTERIOVENOUS (AV) FISTULA CREATION;  Surgeon: Sheree Penne Bruckner, MD;  Location: [facility] OR;  Service: Vascular;  Laterality: Right;   CARDIAC CATHETERIZATION N/A 02/01/2015   Procedure: Right/Left Heart Cath and Coronary Angiography;  Surgeon: Maude JAYSON Emmer, MD;  Location: [facility] INVASIVE CV LAB;  Service: Cardiovascular;  Laterality: N/A;   INCISION AND DRAINAGE Left 09/05/2023   Procedure: INCISION AND DRAINAGE WITH 3RD TOE METATARSAL RESECTION LEFT FOOT AND 4TH TOE PROXIMAL PHALANX RESECTION;  Surgeon: Malvin Marsa FALCON, DPM;  Location: [facility] OR;  Service: Orthopedics/Podiatry;  Laterality: Left;  I&D with possible metatarsal resection, left foot   IR FLUORO GUIDE CV LINE LEFT  07/21/2022   IR FLUORO GUIDE CV LINE LEFT  07/21/2022   IR FLUORO GUIDE CV LINE RIGHT  10/11/2019   IR REMOVAL TUN CV CATH W/O FL  07/17/2022   IR REMOVAL TUN CV CATH W/O FL  08/18/2022   IR US  GUIDE VASC ACCESS LEFT  07/21/2022   IR US  GUIDE VASC ACCESS LEFT  07/21/2022   IR US  GUIDE VASC ACCESS RIGHT  10/11/2019   IRRIGATION AND DEBRIDEMENT FOOT Left 10/30/2023   Procedure: IRRIGATION AND DEBRIDEMENT WOUND;  Surgeon: Malvin Marsa FALCON, DPM;  Location: [facility] OR;  Service: Orthopedics/Podiatry;  Laterality: Left;   RIGHT/LEFT HEART CATH AND CORONARY ANGIOGRAPHY N/A 02/02/2024   Procedure: RIGHT/LEFT HEART CATH AND CORONARY ANGIOGRAPHY;   Surgeon: Rolan Ezra RAMAN, MD;  Location: [facility] INVASIVE CV LAB;  Service: Cardiovascular;  Laterality: N/A;   TEE WITHOUT CARDIOVERSION N/A 07/18/2022   Procedure: TRANSESOPHAGEAL ECHOCARDIOGRAM (TEE);  Surgeon: Alvan Ronal BRAVO, MD;  Location: [facility] ENDOSCOPY;  Service: Cardiovascular;  Laterality: N/A;   THORACOTOMY Left 11/08/2013   Procedure: LEFT THORACOTOMY;  Surgeon: Maude Fleeta Ochoa, MD;  Location: [facility] OR;  Service: Thoracic;  Laterality: Left;   TOOTH EXTRACTION N/A 10/30/2023   Procedure: DENTAL RESTORATION/EXTRACTIONS;  Surgeon: Sheryle Hamilton, DMD;  Location: [facility] OR;  Service: Oral Surgery;  Laterality: N/A;   TRANSESOPHAGEAL ECHOCARDIOGRAM (CATH LAB) N/A 10/29/2023   Procedure: TRANSESOPHAGEAL ECHOCARDIOGRAM;  Surgeon: Rolan Ezra RAMAN, MD;  Location: [facility] INVASIVE CV LAB;  Service: Cardiovascular;  Laterality: N/A;   TUNNELLED CATHETER EXCHANGE N/A 01/21/2024   Procedure: TUNNELLED CATHETER EXCHANGE;  Surgeon: Norine Manuelita LABOR, MD;  Location: [facility] INVASIVE CV LAB;  Service: Cardiovascular;  Laterality: N/A;   TUNNELLED CATHETER EXCHANGE N/A 02/10/2024   Procedure: TUNNELLED CATHETER EXCHANGE;  Surgeon: Gretta Lonni PARAS, MD;  Location: [facility];  Service: Cardiovascular;  Laterality: N/A;   TUNNELLED CATHETER EXCHANGE Left 03/04/2024   Procedure: TUNNELLED CATHETER EXCHANGE;  Surgeon: Gretta Lonni PARAS, MD;  Location: [facility];  Service: Cardiovascular;  Laterality: Left;   Family History  Problem Relation Age of Onset   Diabetes Mellitus II Mother        died @ 83.   Gastric cancer Mother    CAD Father        died @ 19.   Heart attack Father    Congestive Heart Failure Father    Diabetes Mellitus II Sister    CAD Sister        s/p PCI - age 82.   Social History:  reports that he has never smoked. He has never used smokeless tobacco. He reports current drug use. Drug: Marijuana. He reports that he does not drink alcohol.  ROS: As per HPI otherwise negative.  Physical  Exam: Vitals:   05/12/24 1308 05/12/24 1315 05/12/24 1330 05/12/24 1408  BP: (!) 99/49 (!) 121/51 (!) 110/55 (!) 103/37  Pulse: 93 97 90 96  Resp: 20 16 16 18   Temp: 100 F (37.8 C)   99.2 F (37.3 C)  TempSrc:      SpO2: 95% 96% 95% 97%  Weight:      Height:         General: Well developed, well nourished, in no acute distress. Head: Normocephalic, atraumatic, sclera non-icteric, mucus membranes are moist. Neck: Supple without lymphadenopathy/masses. JVD not elevated. Lungs: Clear bilaterally to auscultation without wheezes, rales, or rhonchi. Breathing is unlabored. Heart: RRR with normal S1, S2. No murmurs, rubs, or gallops appreciated. Abdomen: Soft, non-tender, non-distended with normoactive bowel sounds.  Musculoskeletal:  Strength and tone appear normal for age. Lower extremities: No edema. L foot bandaged. Neuro:  Alert and oriented X 3. Moves all extremities spontaneously. Psych:  Responds to questions appropriately with a normal affect. Dialysis Access: TDC in L chest  Allergies  Allergen Reactions   Acyclovir And Related Hives   Imdur  [Isosorbide  Nitrate] Other (See Comments)    Headaches from nitrate   Prior to Admission medications   Medication Sig Start Date End Date Taking? Authorizing Provider  AURYXIA  1 GM 210 MG(Fe) tablet 15 tablets. 04/20/24 09/29/24 Yes [provider]  Accu-Chek Softclix Lancets lancets Use as directed four times daily. 10/15/23     acetaminophen  (TYLENOL ) 500 MG tablet Take 1,000 mg by mouth every 6 (six) hours as needed for moderate pain.    [provider]  atorvastatin  (LIPITOR ) 80 MG tablet Take 1 tablet (80 mg total) by mouth daily. 01/20/24     blood glucose meter kit and supplies KIT Use to check blood sugar up to four times daily as directed. 10/15/23   Masters, Katie, DO  carvedilol  (COREG ) 25 MG tablet Take 2 tablets (50 mg total) by mouth 2 (two) times daily. 10/15/23   Masters, Izetta, DO  carvedilol  (COREG ) 25 MG  tablet Take 1 tablet by mouth twice a day with meals Patient not taking: Reported on 04/05/2024 03/23/24     doxycycline  (VIBRA -TABS) 100 MG tablet Take 1 tablet (100 mg total) by mouth every 12 (twelve) hours with food for 7 days. 04/13/24   Macel Jayson PARAS, MD  glucose blood (ACCU-CHEK GUIDE TEST) test strip Use as directed four times daily. 10/15/23     hydrALAZINE  (APRESOLINE ) 50 MG tablet Take 1 tablet by mouth 3 times a day 01/04/24     insulin  aspart (NOVOLOG  FLEXPEN) 100 UNIT/ML FlexPen Inject 15 Units into the skin 3 (three) times daily as needed for high blood sugar. 02/05/24   Tobie Gaines, DO  insulin  glargine-yfgn (SEMGLEE ) 100 UNIT/ML Pen Inject 15 Units into the skin daily. Patient taking differently: Inject 15 Units into the skin every evening. 11/01/23   Masters, Izetta, DO  methocarbamol  (ROBAXIN ) 500 MG tablet Take 1 tablet (500 mg total) by mouth daily as needed. 01/26/24     multivitamin (RENA-VIT) TABS tablet Take 1 tablet by mouth daily.    [provider]  nitroGLYCERIN  (NITROSTAT ) 0.4 MG SL tablet Place 1 tablet (0.4 mg total) under the tongue every 5 (five) minutes x 3 doses as needed within 15 minutes. If chest pain does not resolve, seek medical attention. 11/10/23   Clegg, Amy D, NP  ondansetron  (ZOFRAN ) 4 MG tablet Take 1 tablet (4 mg total) by mouth daily as needed. 11/04/23     pantoprazole  (PROTONIX ) 20 MG tablet Take 1 tablet (20 mg total) by mouth daily as directed for reflux. 02/03/24     pantoprazole  (PROTONIX ) 40 MG tablet Take 1 tablet (40 mg total) by mouth daily. 01/01/24 12/31/24  Alexander-Savino, [place], MD  sevelamer  carbonate (RENVELA ) 800 MG tablet Take 3 tablets (2,400 mg total) by mouth 3 (three) times daily with meals 11/01/23   Masters, Izetta, DO  Tenapanor  HCl, CKD, (XPHOZAH ) 30 MG TABS Take 1 tablet by mouth twice a day as directed Take 1 in the morning and 1 at night 02/11/24      Current Facility-Administered Medications  Medication Dose Route  Frequency Provider Last Rate Last Admin   0.9 %  sodium chloride  infusion  250 mL Intravenous PRN Agbata, Tochukwu, MD       acetaminophen  (TYLENOL ) tablet 650 mg  650 mg Oral Q6H  PRN Jonel Lonni SQUIBB, MD       Ampicillin -Sulbactam (UNASYN ) 3 g in sodium chloride  0.9 % 100 mL IVPB  3 g Intravenous Q24H Agbata, Tochukwu, MD       atorvastatin  (LIPITOR ) tablet 80 mg  80 mg Oral Daily Agbata, Tochukwu, MD   80 mg at 05/11/24 1724   carvedilol  (COREG ) tablet 50 mg  50 mg Oral BID Agbata, Tochukwu, MD   50 mg at 05/12/24 1051   Chlorhexidine  Gluconate Cloth 2 % PADS 6 each  6 each Topical Daily Agbata, Tochukwu, MD   6 each at 05/12/24 1000   hydrALAZINE  (APRESOLINE ) tablet 50 mg  50 mg Oral TID Agbata, Tochukwu, MD   50 mg at 05/12/24 1052   insulin  aspart (novoLOG ) injection 0-9 Units  0-9 Units Subcutaneous TID WC Agbata, Tochukwu, MD   1 Units at 05/12/24 1445   insulin  glargine (LANTUS ) injection 15 Units  15 Units Subcutaneous QPM Agbata, Tochukwu, MD       melatonin tablet 5 mg  5 mg Oral QHS PRN Chavez, Abigail, NP   5 mg at 05/11/24 2157   methocarbamol  (ROBAXIN ) tablet 500 mg  500 mg Oral Q8H PRN Agbata, Tochukwu, MD   500 mg at 05/11/24 1945   multivitamin (RENA-VIT) tablet 1 tablet  1 tablet Oral Daily Agbata, Tochukwu, MD   1 tablet at 05/11/24 1724   nitroGLYCERIN  (NITROSTAT ) SL tablet 0.4 mg  0.4 mg Sublingual Q5 Min x 3 PRN Agbata, Tochukwu, MD   0.4 mg at 05/12/24 0355   ondansetron  (ZOFRAN ) tablet 4 mg  4 mg Oral Q6H PRN Agbata, Tochukwu, MD   4 mg at 05/12/24 1052   Or   ondansetron  (ZOFRAN ) injection 4 mg  4 mg Intravenous Q6H PRN Agbata, Tochukwu, MD   4 mg at 05/12/24 1239   oxyCODONE  (Oxy IR/ROXICODONE ) immediate release tablet 5 mg  5 mg Oral Q4H PRN Danford, Lonni SQUIBB, MD       pantoprazole  (PROTONIX ) EC tablet 20 mg  20 mg Oral Daily Agbata, Tochukwu, MD   20 mg at 05/11/24 1724   sevelamer  carbonate (RENVELA ) tablet 2,400 mg  2,400 mg Oral TID with meals Agbata,  Tochukwu, MD   2,400 mg at 05/12/24 1444   sodium chloride  flush (NS) 0.9 % injection 10-40 mL  10-40 mL Intracatheter Q12H Agbata, Tochukwu, MD   10 mL at 05/12/24 1000   sodium chloride  flush (NS) 0.9 % injection 10-40 mL  10-40 mL Intracatheter PRN Agbata, Tochukwu, MD       sodium chloride  flush (NS) 0.9 % injection 3 mL  3 mL Intravenous Q12H Agbata, Tochukwu, MD   3 mL at 05/12/24 1000   sodium chloride  flush (NS) 0.9 % injection 3 mL  3 mL Intravenous PRN Agbata, Tochukwu, MD       Tenapanor  HCl (CKD) TABS 30 mg  30 mg Oral BID Agbata, Tochukwu, MD       [START ON 05/13/2024] vancomycin  (VANCOCIN ) 1,000 mg in sodium chloride  0.9 % 250 mL IVPB  1,000 mg Intravenous Q M,W,F-HD Agbata, Tochukwu, MD       Labs: Basic Metabolic Panel: Recent Labs  Lab 05/11/24 1206 05/11/24 1214 05/12/24 0341  NA 132* 134* 136  K 4.1 4.2 4.2  CL 90* 93* 91*  CO2 28  --  21*  GLUCOSE 195* 197* 138*  BUN 25* 24* 35*  CREATININE 7.47* 7.60* 9.46*  CALCIUM  8.8*  --  9.2   Liver Function Tests: Recent  Labs  Lab 05/11/24 1206 05/12/24 0341  AST 21 23  ALT 17 19  ALKPHOS 44 52  BILITOT 0.5 0.6  PROT 8.1 8.1  ALBUMIN  2.4* 2.3*   CBC: Recent Labs  Lab 05/11/24 1206 05/11/24 1214  WBC 16.1*  --   NEUTROABS 13.9*  --   HGB 11.4* 13.3  HCT 36.7* 39.0  MCV 83.0  --   PLT 417*  --    CBG: Recent Labs  Lab 05/11/24 1721 05/11/24 2143 05/12/24 0616 05/12/24 1046 05/12/24 1308  GLUCAP 134* 174* 155* 128* 121*   Studies/Results: DG Foot 2 Views Left Result Date: 05/12/2024 CLINICAL DATA:  Postop. EXAM: LEFT FOOT - 2 VIEW COMPARISON:  Preoperative imaging. FINDINGS: Interval resection of the second toe proximal phalanx and distal aspect of the second metatarsal. Expected postoperative changes in the soft tissues. Previous postsurgical change of the third and fourth ray. Stable foot alignment. IMPRESSION: Interval resection of the second toe proximal phalanx and distal aspect of the second  metatarsal. Electronically Signed   By: Andrea Gasman M.D.   On: 05/12/2024 14:58   VAS US  ABI WITH/WO TBI Result Date: 05/12/2024  LOWER EXTREMITY DOPPLER STUDY Patient Name:  Jeremy [name]  Date of Exam:   05/12/2024 Medical Rec #: [id number]        Accession #:    [id number] Date of Birth: May 23, 1981        Patient Gender: M Patient Age:   80 years Exam Location:  [facility] Procedure:      VAS US  ABI WITH/WO TBI Referring Phys: HAMP FONDAW --------------------------------------------------------------------------------  Indications: Rest pain, ulceration, and gangrene. High Risk Factors: Hypertension, hyperlipidemia, Diabetes.  Comparison Study: Previous study on 2.17.2025. Performing Technologist: Edilia Elden Appl  Examination Guidelines: A complete evaluation includes at minimum, Doppler waveform signals and systolic blood pressure reading at the level of bilateral brachial, anterior tibial, and posterior tibial arteries, when vessel segments are accessible. Bilateral testing is considered an integral part of a complete examination. Photoelectric Plethysmograph (PPG) waveforms and toe systolic pressure readings are included as required and additional duplex testing as needed. Limited examinations for reoccurring indications may be performed as noted.  ABI Findings: +---------+------------------+-----+---------+--------+ Right    Rt Pressure (mmHg)IndexWaveform Comment  +---------+------------------+-----+---------+--------+ Brachial 157                    triphasic         +---------+------------------+-----+---------+--------+ PTA      226               1.44 triphasic         +---------+------------------+-----+---------+--------+ DP       223               1.42 triphasic         +---------+------------------+-----+---------+--------+ [facility]               1.36 Normal            +---------+------------------+-----+---------+--------+  +---------+------------------+-----+-----------+-------+ Left     Lt Pressure (mmHg)IndexWaveform   Comment +---------+------------------+-----+-----------+-------+ Brachial 155                    triphasic          +---------+------------------+-----+-----------+-------+ PTA      211               1.34 multiphasic        +---------+------------------+-----+-----------+-------+ DP       201  1.28 multiphasic        +---------+------------------+-----+-----------+-------+ Great Toe254               1.62 Normal             +---------+------------------+-----+-----------+-------+ +-------+-----------+----------------+------------+------------+ ABI/TBIToday's ABIToday's TBI     Previous ABIPrevious TBI +-------+-----------+----------------+------------+------------+ Right  1.44       1.36                                     +-------+-----------+----------------+------------+------------+ Left   1.34       Noncompressible.                         +-------+-----------+----------------+------------+------------+  Summary: Right: Resting right ankle-brachial index indicates noncompressible right lower extremity arteries. The right toe-brachial index is abnormal.  Left: Resting left ankle-brachial index indicates noncompressible left lower extremity arteries. The left toe-brachial index is abnormal.  *See table(s) above for measurements and observations.  Electronically signed by Lonni Gaskins MD on 05/12/2024 at 2:34:40 PM.    Final    MR FOOT LEFT WO CONTRAST Result Date: 05/11/2024 CLINICAL DATA:  Ulceration of the left plantar foot underlying the distal second metatarsal. History of prior partial third ray amputation and partial amputation of the fourth proximal phalanx. Concern for osteomyelitis. EXAM: MRI OF THE LEFT FOOT WITHOUT CONTRAST TECHNIQUE: Multiplanar, multisequence MR imaging of the left forefoot was performed. No intravenous contrast was  administered. COMPARISON:  Left foot radiographs dated 05/10/2024. MRI of the left foot dated 10/26/2023. FINDINGS: Bones/Joint/Cartilage Soft tissue ulceration at the plantar forefoot, overlying the second metatarsal head, with sinus tract extending through the plantar soft tissues to the level of the underlying second metatarsal head/second MTP joint. There is T2 hyperintense marrow signal abnormality with corresponding T1 hypointensity of the second metatarsal head and neck and the second proximal phalanx with a small second MTP joint effusion, most compatible with acute osteomyelitis/septic arthritis. Extensive surrounding soft tissue edema at the level of the second MTP joint and through the mid second digit with surrounding foci of susceptibility artifact, likely reflecting soft tissue air. No marrow signal abnormality identified elsewhere to suggest osteomyelitis. Postsurgical changes related to prior amputation of the third ray through the base of the third metatarsal and partial amputation of the base of the fourth proximal phalanx. Ligaments Lisfranc ligament is intact. Muscles and Tendons Postoperative changes related to prior amputations. Atrophy of the intrinsic foot musculature, likely reflects chronic denervation changes. No significant tenosynovitis. Soft tissue Soft tissue ulceration at the plantar forefoot with sinus tract extending through the plantar soft tissues to the level of the underlying second metatarsal head/second MTP joint. Associated surrounding soft tissue edema with foci of susceptibility artifact, compatible with soft tissue air, which may be communicating from the overlying wound, however, gas-forming infection can not be entirely excluded. No drainable loculated fluid collection. Postoperative changes related to prior amputations. IMPRESSION: 1. Soft tissue ulceration at the plantar forefoot with sinus tract extending to the level of the underlying second metatarsal head/second  MTP joint. Marrow signal abnormality of the second metatarsal head and neck and the second proximal phalanx with a small second MTP joint effusion is most compatible with acute osteomyelitis/septic arthritis. Extensive surrounding soft tissue edema at second MTP joint and through the mid second digit with surrounding foci of susceptibility artifact, likely reflecting soft tissue air, which may be communicating from  the overlying wound, however, gas-forming infection can not be excluded. No drainable fluid collection. 2. 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 Electronically Signed   By: Harrietta Sherry M.D.   On: 05/11/2024 14:40   DG Foot Complete Left Result Date: 05/11/2024 Please see detailed radiograph report in office note.  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 Per podiatry.  ESRD:  Continue HD on MWF schedule -> next tomorrow.  Hypertension/volume: BP controlled, no edema on exam.  Anemia: Hgb > 12, no ESA  Metabolic bone disease: CorrCa high side, no VDRA, resume binders with meals.  T2DM  Izetta Boehringer, PA-C 05/12/2024, 3:30 PM  [employer]

## 2024-05-13 ENCOUNTER — Other Ambulatory Visit (HOSPITAL_COMMUNITY): Payer: Self-pay

## 2024-05-13 ENCOUNTER — Encounter (HOSPITAL_COMMUNITY): Payer: Self-pay | Admitting: Podiatry

## 2024-05-13 DIAGNOSIS — I472 Ventricular tachycardia, unspecified: Secondary | ICD-10-CM | POA: Diagnosis not present

## 2024-05-13 DIAGNOSIS — Z992 Dependence on renal dialysis: Secondary | ICD-10-CM | POA: Diagnosis not present

## 2024-05-13 DIAGNOSIS — N186 End stage renal disease: Secondary | ICD-10-CM | POA: Diagnosis not present

## 2024-05-13 DIAGNOSIS — I132 Hypertensive heart and chronic kidney disease with heart failure and with stage 5 chronic kidney disease, or end stage renal disease: Secondary | ICD-10-CM | POA: Diagnosis not present

## 2024-05-13 DIAGNOSIS — M86172 Other acute osteomyelitis, left ankle and foot: Secondary | ICD-10-CM | POA: Diagnosis not present

## 2024-05-13 DIAGNOSIS — I120 Hypertensive chronic kidney disease with stage 5 chronic kidney disease or end stage renal disease: Secondary | ICD-10-CM | POA: Diagnosis not present

## 2024-05-13 DIAGNOSIS — Z1152 Encounter for screening for COVID-19: Secondary | ICD-10-CM | POA: Diagnosis not present

## 2024-05-13 DIAGNOSIS — R569 Unspecified convulsions: Secondary | ICD-10-CM | POA: Diagnosis not present

## 2024-05-13 DIAGNOSIS — I428 Other cardiomyopathies: Secondary | ICD-10-CM | POA: Diagnosis not present

## 2024-05-13 DIAGNOSIS — E1169 Type 2 diabetes mellitus with other specified complication: Principal | ICD-10-CM

## 2024-05-13 DIAGNOSIS — D631 Anemia in chronic kidney disease: Secondary | ICD-10-CM | POA: Diagnosis not present

## 2024-05-13 DIAGNOSIS — J45909 Unspecified asthma, uncomplicated: Secondary | ICD-10-CM | POA: Diagnosis not present

## 2024-05-13 DIAGNOSIS — N250 Renal osteodystrophy: Secondary | ICD-10-CM | POA: Diagnosis not present

## 2024-05-13 DIAGNOSIS — E1122 Type 2 diabetes mellitus with diabetic chronic kidney disease: Secondary | ICD-10-CM | POA: Diagnosis not present

## 2024-05-13 DIAGNOSIS — M869 Osteomyelitis, unspecified: Secondary | ICD-10-CM | POA: Diagnosis not present

## 2024-05-13 LAB — CBC
HCT: 30.4 % — ABNORMAL LOW (ref 39.0–52.0)
Hemoglobin: 9.8 g/dL — ABNORMAL LOW (ref 13.0–17.0)
MCH: 26.3 pg (ref 26.0–34.0)
MCHC: 32.2 g/dL (ref 30.0–36.0)
MCV: 81.5 fL (ref 80.0–100.0)
Platelets: 415 K/uL — ABNORMAL HIGH (ref 150–400)
RBC: 3.73 MIL/uL — ABNORMAL LOW (ref 4.22–5.81)
RDW: 18.3 % — ABNORMAL HIGH (ref 11.5–15.5)
WBC: 13.9 K/uL — ABNORMAL HIGH (ref 4.0–10.5)
nRBC: 0 % (ref 0.0–0.2)

## 2024-05-13 LAB — BASIC METABOLIC PANEL WITH GFR
Anion gap: 15 (ref 5–15)
BUN: 55 mg/dL — ABNORMAL HIGH (ref 6–20)
CO2: 26 mmol/L (ref 22–32)
Calcium: 9.2 mg/dL (ref 8.9–10.3)
Chloride: 93 mmol/L — ABNORMAL LOW (ref 98–111)
Creatinine, Ser: 12.8 mg/dL — ABNORMAL HIGH (ref 0.61–1.24)
GFR, Estimated: 4 mL/min — ABNORMAL LOW (ref >60)
Glucose, Bld: 134 mg/dL — ABNORMAL HIGH (ref 70–99)
Potassium: 4.4 mmol/L (ref 3.5–5.1)
Sodium: 134 mmol/L — ABNORMAL LOW (ref 135–145)

## 2024-05-13 LAB — HEPATITIS B SURFACE ANTIBODY, QUANTITATIVE: Hep B S AB Quant (Post): 309 m[IU]/mL

## 2024-05-13 LAB — GLUCOSE, CAPILLARY
Glucose-Capillary: 133 mg/dL — ABNORMAL HIGH (ref 70–99)
Glucose-Capillary: 127 mg/dL — ABNORMAL HIGH (ref 70–99)
Glucose-Capillary: 110 mg/dL — ABNORMAL HIGH (ref 70–99)

## 2024-05-13 MED ORDER — SODIUM CHLORIDE 0.9 % IV SOLN
2.0000 g | INTRAVENOUS | Status: DC
  Filled 2024-05-13: qty 2

## 2024-05-13 MED ORDER — ANTICOAGULANT SODIUM CITRATE 4% (200MG/5ML) IV SOLN
5.0000 mL | Status: DC | PRN
Start: 2024-05-13 — End: 2024-05-13

## 2024-05-13 MED ORDER — VANCOMYCIN IV (FOR PTA / DISCHARGE USE ONLY): 1000.0000 mg | INTRAVENOUS | Status: AC

## 2024-05-13 MED ORDER — OXYCODONE HCL 5 MG PO TABS
5.0000 mg | ORAL_TABLET | Freq: Four times a day (QID) | ORAL | 0 refills | Status: AC | PRN
  Filled 2024-05-13 (×2): qty 15, 4d supply, fill #0

## 2024-05-13 MED ORDER — HEPARIN SODIUM (PORCINE) 1000 UNIT/ML DIALYSIS
5000.0000 [IU] | Freq: Once | INTRAMUSCULAR | Status: AC
  Administered 2024-05-13: 5000 [IU] via INTRAVENOUS_CENTRAL

## 2024-05-13 MED ORDER — CEFTAZIDIME IV (FOR PTA / DISCHARGE USE ONLY): 2.0000 g | INTRAVENOUS | Status: AC

## 2024-05-13 MED ORDER — HEPARIN SODIUM (PORCINE) 1000 UNIT/ML DIALYSIS: 1000.0000 [IU] | INTRAMUSCULAR | Status: DC | PRN

## 2024-05-13 MED ORDER — ALTEPLASE 2 MG IJ SOLR: 2.0000 mg | Freq: Once | INTRAMUSCULAR | Status: DC | PRN

## 2024-05-13 MED ORDER — ACETAMINOPHEN 325 MG PO TABS
ORAL_TABLET | ORAL | Status: AC
  Filled 2024-05-13: qty 2

## 2024-05-13 MED ORDER — HEPARIN SODIUM (PORCINE) 1000 UNIT/ML IJ SOLN
INTRAMUSCULAR | Status: AC
  Filled 2024-05-13: qty 5

## 2024-05-13 MED ORDER — HEPARIN SODIUM (PORCINE) 1000 UNIT/ML IJ SOLN
4200.0000 [IU] | Freq: Once | INTRAMUSCULAR | Status: AC
  Administered 2024-05-13: 4200 [IU]

## 2024-05-13 NOTE — Progress Notes (Signed)
 PHARMACY CONSULT NOTE FOR:  OUTPATIENT  PARENTERAL ANTIBIOTIC THERAPY With Dialysis   Indication: Left foot osteomyelitis s/p I and D 9/4  Regimen: Vanc 1 gm with HD MWF + Ceftazidime  2 gm with HD MWF  End date: 06/22/24  No formal OPAT will be done as patient will receive antibiotics with HD. Informational orders will be placed for discharge.   Thank you for allowing pharmacy to be a part of this patient's care.  Damien Quiet, PharmD, BCPS, BCIDP Infectious Diseases Clinical Pharmacist Phone: [phone] 05/13/2024, 2:53 PM

## 2024-05-13 NOTE — Progress Notes (Signed)
 Discharge Nurse Summary: DC order noted per MD. DC RN at bedside with patient. Patient agreeable with discharge plan, states family will arrive soon for pickup.   AVS printed/reviewed. PIV removed, skin intact. No DME needs. No home meds. TOC meds delivered to the patient. CP/Edu resolved. Telemonitor returned to charging station. Wound CDI w/o bleeding or drainage. See LDAs for more information. All belongings accounted for including wound care supplies - kerlix, ace bandage, and iodoform as provided by provider. Patient wheeled downstairs for discharge by private auto.   Rosario EMERSON Lund, RN

## 2024-05-13 NOTE — Progress Notes (Signed)
 Pt receives out-pt HD at [facility] clinic, MWF, [phone] chair time. Will continue to assist as needed.   Lavanda Daylynn Stumpp Dialysis Navigator [phone]

## 2024-05-13 NOTE — Discharge Summary (Signed)
 Physician Discharge Summary   Patient: Jeremy [name] MRN: [id number] DOB: August 02, 1981  Admit date:     05/11/2024  Discharge date: 05/13/24  Discharge Physician: Lonni SHAUNNA Dalton   PCP: D'Mello, Rosalyn, DO     Recommendations at discharge:  Follow up with Dr. Malvin Podiatry in 1 week for 2nd ray amputation Follow up with ID Dr. Overton on 9/30 for osteomyelitis and septic arthritis HD center: Please obtain CBC/D, CMP, ESR and CRP weekly while on antibiotics and fax to Dr. Overton     Discharge Diagnoses: Principal Problem:   Osteomyelitis (HCC) Active Problems:   Septic arthritis of 2nd MP joint   Diabetic foot ulcer (HCC)   ESRD   Obesity, Class I, BMI 30-34.9   Primary hypertension   Chronic combined systolic and diastolic CHF (congestive heart failure) (HCC)   Anemia in chronic kidney disease   Diabetes mellitus with ESRD (end-stage renal disease) [facility])      Hospital Course: 43 y.o. M with ESRD on HD MWF, CHF EF 35%, HTN, PVD and prior amputations who presented with tunneling nonhealing left plantar foot wound.   MRI in the ER confirmed marrow abnormality of 2nd metatarsal head, second proximal phalanx and septic 2nd MTP joint.  Started on antibiotics and admitted.    Osteomyelitis (HCC) Diabetic foot infection Admitted on IV antibiotics.  ABIs completed and ankle vessels were noncompressible but multiphasic waveform on left and toe-brachial index >1.    Went to the OR with Dr. Malvin on 9/4 for irrigation and debridement of second MPJ with resection of proximal phalanx and second metatarsal head and neck of the left foot   Postop did well, pain controlled.  Given culture data and operative findings, ID were consulted and recommended 6 weeks IV vancomycin  and ceftazidime  with dialysis.       Diabetes mellitus with ESRD (end-stage renal disease) (HCC) A1c 6.4%.   Continue home medications.    ESRD (end stage renal disease) (HCC) Underwent routine HD in  the hospital without incident.     Anemia in chronic kidney disease Hgb stable relative to baseline, slight expected post-op drop.   Chronic combined systolic and diastolic CHF (congestive heart failure) (HCC) Primary hypertension EF Feb 35%, appears volume optimized at this time.  There was some concern that he had a brief episode of Afib while on HD post-op, but on ECG there was frequent PVCs, otherwise sinus rhythm.   Continue Coreg    Obesity, Class I, BMI 30-34.9                The [place]  Controlled Substances Registry was reviewed for this patient prior to discharge.   Consultants: Podiatry Dr. Malvin Infectious Disease Dr. Overton  Procedures performed:  irrigation and debridement of second MPJ with resection of proximal phalanx and second metatarsal head and neck of the left foot  Disposition: Home Diet recommendation:  Discharge Diet Orders (From admission, onward)     Start     Ordered   05/13/24 0000  Diet - low sodium heart healthy        05/13/24 1511             DISCHARGE MEDICATION: Allergies as of 05/13/2024       Reactions   Acyclovir And Related Hives   Imdur  [isosorbide  Nitrate] Other (See Comments)   Headaches from nitrate        Medication List     STOP taking these medications    doxycycline  100 MG tablet  Commonly known as: VIBRA -TABS       TAKE these medications    Accu-Chek Guide Test test strip Generic drug: glucose blood Use as directed four times daily.   Accu-Chek Guide w/Device Kit Use to check blood sugar up to four times daily as directed.   Accu-Chek Softclix Lancets lancets Use as directed four times daily.   acetaminophen  500 MG tablet Commonly known as: TYLENOL  Take 1,000 mg by mouth every 6 (six) hours as needed for moderate pain.   atorvastatin  80 MG tablet Commonly known as: LIPITOR  Take 1 tablet (80 mg total) by mouth daily.   Auryxia  1 GM 210 MG(Fe) tablet Generic drug: ferric citrate  15  tablets.   carvedilol  25 MG tablet Commonly known as: COREG  Take 2 tablets (50 mg total) by mouth 2 (two) times daily. What changed: Another medication with the same name was removed. Continue taking this medication, and follow the directions you see here.   cefTAZidime  IVPB Commonly known as: FORTAZ  Inject 2 g into the vein every Monday, Wednesday, and Friday. Indication:  Left foot osteomyelitis  First Dose: Yes Last Day of Therapy:  06/22/24   hydrALAZINE  50 MG tablet Commonly known as: APRESOLINE  Take 1 tablet by mouth 3 times a day   insulin  glargine-yfgn 100 UNIT/ML Pen Commonly known as: SEMGLEE  Inject 15 Units into the skin daily. What changed: when to take this   methocarbamol  500 MG tablet Commonly known as: ROBAXIN  Take 1 tablet (500 mg total) by mouth daily as needed.   multivitamin Tabs tablet Take 1 tablet by mouth daily.   nitroGLYCERIN  0.4 MG SL tablet Commonly known as: NITROSTAT  Place 1 tablet (0.4 mg total) under the tongue every 5 (five) minutes x 3 doses as needed within 15 minutes. If chest pain does not resolve, seek medical attention.   NovoLOG  FlexPen 100 UNIT/ML FlexPen Generic drug: insulin  aspart Inject 15 Units into the skin 3 (three) times daily as needed for high blood sugar.   ondansetron  4 MG tablet Commonly known as: ZOFRAN  Take 1 tablet (4 mg total) by mouth daily as needed.   oxyCODONE  5 MG immediate release tablet Commonly known as: Oxy IR/ROXICODONE  Take 1 tablet (5 mg total) by mouth every 6 (six) hours as needed for breakthrough pain.   pantoprazole  20 MG tablet Commonly known as: Protonix  Take 1 tablet (20 mg total) by mouth daily as directed for reflux. What changed: Another medication with the same name was removed. Continue taking this medication, and follow the directions you see here.   sevelamer  carbonate 800 MG tablet Commonly known as: RENVELA  Take 3 tablets (2,400 mg total) by mouth 3 (three) times daily with  meals   vancomycin  IVPB Inject 1,000 mg into the vein every Monday, Wednesday, and Friday. Indication:  Left foot osteomyelitis  First Dose: Yes Last Day of Therapy:  06/22/24  Labs - Once Weekly - vancomycin  trough   Xphozah  30 MG Tabs Generic drug: Tenapanor  HCl (CKD) Take 1 tablet by mouth twice a day as directed Take 1 in the morning and 1 at night               Home Infusion Instuctions  (From admission, onward)           Start     Ordered   05/13/24 0000  Home infusion instructions       Question:  Instructions  Answer:  Flushing of vascular access device: 0.9% NaCl pre/post medication administration and prn patency; Heparin  100  u/ml, 5ml for implanted ports and Heparin  10u/ml, 5ml for all other central venous catheters.   05/13/24 1459              Discharge Care Instructions  (From admission, onward)           Start     Ordered   05/13/24 0000  Discharge wound care:       Comments: Left foot dressing change with 1 in iodoform gauze packing in plantar wound, cover with 4x4 kerlix and ace wrap   05/13/24 1511            Follow-up Information     Standiford, Marsa FALCON, DPM. Schedule an appointment as soon as possible for a visit.   Specialty: Podiatry Why: for post op care of the surgery site Contact information: [address] [place] [address] [phone]         Overton Faith T, MD. Schedule an appointment as soon as possible for a visit in 6 week(s).   Specialty: Infectious Diseases Why: for infectious disease follow up Contact information: [address] [place] [address] [phone]                 Discharge Instructions     Diet - low sodium heart healthy   Complete by: As directed    Discharge instructions   Complete by: As directed    **IMPORTANT DISCHARGE INSTRUCTIONS**   From Dr. Jonel: You were admitted for a foot infection that had tunneled to the bone  This  had to be amputated, and so as much of the infected bone and joint were amputated as was safely possible  To treat and eradicate any residual infection, take vancomycin  and ceftazidime  with dialysis for 6 weeks  Follow up with Dr. Malvin in 1 week Follow all weight bearing, showering and wound care instructions he gave you  For pain, take acetaminophen  or methocarbamol  if you have any at home You may also take oxycodone  5 mg up to three times daily Do not drive or drink alcohol with oxycodone  Dispose of excess at your pharmacy, store in a secure location  Go see the infectious disease doctors in 4-6 weeks, Call their office to schedule   Discharge wound care:   Complete by: As directed    Left foot dressing change with 1 in iodoform gauze packing in plantar wound, cover with 4x4 kerlix and ace wrap   Home infusion instructions   Complete by: As directed    Instructions: Flushing of vascular access device: 0.9% NaCl pre/post medication administration and prn patency; Heparin  100 u/ml, 5ml for implanted ports and Heparin  10u/ml, 5ml for all other central venous catheters.   Increase activity slowly   Complete by: As directed        Discharge Exam: Filed Weights   05/12/24 1109 05/13/24 0822 05/13/24 1221  Weight: 104.3 kg 115.4 kg 113.5 kg    General: Pt is alert, awake, not in acute distress Cardiovascular: RRR, nl S1-S2, no murmurs appreciated.   No LE edema.   Respiratory: Normal respiratory rate and rhythm.  CTAB without rales or wheezes. Abdominal: Abdomen soft and non-tender.  No distension or HSM.   Neuro/Psych: Strength symmetric in upper and lower extremities.  Judgment and insight appear normal.   Condition at discharge: good  The results of significant diagnostics from this hospitalization (including imaging, microbiology, ancillary and laboratory) are listed below for reference.   Imaging Studies: DG Foot  2 Views Left Result Date: 05/12/2024 CLINICAL DATA:   Postop. EXAM: LEFT FOOT - 2 VIEW COMPARISON:  Preoperative imaging. FINDINGS: Interval resection of the second toe proximal phalanx and distal aspect of the second metatarsal. Expected postoperative changes in the soft tissues. Previous postsurgical change of the third and fourth ray. Stable foot alignment. IMPRESSION: Interval resection of the second toe proximal phalanx and distal aspect of the second metatarsal. Electronically Signed   By: Andrea Gasman M.D.   On: 05/12/2024 14:58   VAS US  ABI WITH/WO TBI Result Date: 05/12/2024  LOWER EXTREMITY DOPPLER STUDY Patient Name:  Nickholas Goldston  Date of Exam:   05/12/2024 Medical Rec #: [id number]        Accession #:    [id number] Date of Birth: 09/15/1980        Patient Gender: M Patient Age:   17 years Exam Location:  [facility] Procedure:      VAS US  ABI WITH/WO TBI Referring Phys: HAMP FONDAW --------------------------------------------------------------------------------  Indications: Rest pain, ulceration, and gangrene. High Risk Factors: Hypertension, hyperlipidemia, Diabetes.  Comparison Study: Previous study on 2.17.2025. Performing Technologist: Edilia Elden Appl  Examination Guidelines: A complete evaluation includes at minimum, Doppler waveform signals and systolic blood pressure reading at the level of bilateral brachial, anterior tibial, and posterior tibial arteries, when vessel segments are accessible. Bilateral testing is considered an integral part of a complete examination. Photoelectric Plethysmograph (PPG) waveforms and toe systolic pressure readings are included as required and additional duplex testing as needed. Limited examinations for reoccurring indications may be performed as noted.  ABI Findings: +---------+------------------+-----+---------+--------+ Right    Rt Pressure (mmHg)IndexWaveform Comment  +---------+------------------+-----+---------+--------+ Brachial 157                    triphasic          +---------+------------------+-----+---------+--------+ PTA      226               1.44 triphasic         +---------+------------------+-----+---------+--------+ DP       223               1.42 triphasic         +---------+------------------+-----+---------+--------+ Burnetta Plumber               1.36 Normal            +---------+------------------+-----+---------+--------+ +---------+------------------+-----+-----------+-------+ Left     Lt Pressure (mmHg)IndexWaveform   Comment +---------+------------------+-----+-----------+-------+ Brachial 155                    triphasic          +---------+------------------+-----+-----------+-------+ PTA      211               1.34 multiphasic        +---------+------------------+-----+-----------+-------+ DP       201               1.28 multiphasic        +---------+------------------+-----+-----------+-------+ Great Toe254               1.62 Normal             +---------+------------------+-----+-----------+-------+ +-------+-----------+----------------+------------+------------+ ABI/TBIToday's ABIToday's TBI     Previous ABIPrevious TBI +-------+-----------+----------------+------------+------------+ Right  1.44       1.36                                     +-------+-----------+----------------+------------+------------+  Left   1.34       Noncompressible.                         +-------+-----------+----------------+------------+------------+  Summary: Right: Resting right ankle-brachial index indicates noncompressible right lower extremity arteries. The right toe-brachial index is abnormal.  Left: Resting left ankle-brachial index indicates noncompressible left lower extremity arteries. The left toe-brachial index is abnormal.  *See table(s) above for measurements and observations.  Electronically signed by Lonni Gaskins MD on 05/12/2024 at 2:34:40 PM.    Final    MR FOOT LEFT WO CONTRAST Result Date:  05/11/2024 CLINICAL DATA:  Ulceration of the left plantar foot underlying the distal second metatarsal. History of prior partial third ray amputation and partial amputation of the fourth proximal phalanx. Concern for osteomyelitis. EXAM: MRI OF THE LEFT FOOT WITHOUT CONTRAST TECHNIQUE: Multiplanar, multisequence MR imaging of the left forefoot was performed. No intravenous contrast was administered. COMPARISON:  Left foot radiographs dated 05/10/2024. MRI of the left foot dated 10/26/2023. FINDINGS: Bones/Joint/Cartilage Soft tissue ulceration at the plantar forefoot, overlying the second metatarsal head, with sinus tract extending through the plantar soft tissues to the level of the underlying second metatarsal head/second MTP joint. There is T2 hyperintense marrow signal abnormality with corresponding T1 hypointensity of the second metatarsal head and neck and the second proximal phalanx with a small second MTP joint effusion, most compatible with acute osteomyelitis/septic arthritis. Extensive surrounding soft tissue edema at the level of the second MTP joint and through the mid second digit with surrounding foci of susceptibility artifact, likely reflecting soft tissue air. No marrow signal abnormality identified elsewhere to suggest osteomyelitis. Postsurgical changes related to prior amputation of the third ray through the base of the third metatarsal and partial amputation of the base of the fourth proximal phalanx. Ligaments Lisfranc ligament is intact. Muscles and Tendons Postoperative changes related to prior amputations. Atrophy of the intrinsic foot musculature, likely reflects chronic denervation changes. No significant tenosynovitis. Soft tissue Soft tissue ulceration at the plantar forefoot with sinus tract extending through the plantar soft tissues to the level of the underlying second metatarsal head/second MTP joint. Associated surrounding soft tissue edema with foci of susceptibility artifact,  compatible with soft tissue air, which may be communicating from the overlying wound, however, gas-forming infection can not be entirely excluded. No drainable loculated fluid collection. Postoperative changes related to prior amputations. IMPRESSION: 1. Soft tissue ulceration at the plantar forefoot with sinus tract extending to the level of the underlying second metatarsal head/second MTP joint. Marrow signal abnormality of the second metatarsal head and neck and the second proximal phalanx with a small second MTP joint effusion is most compatible with acute osteomyelitis/septic arthritis. Extensive surrounding soft tissue edema at second MTP joint and through the mid second digit with surrounding foci of susceptibility artifact, likely reflecting soft tissue air, which may be communicating from the overlying wound, however, gas-forming infection can not be excluded. No drainable fluid collection. 2. 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 Electronically Signed   By: Harrietta Sherry M.D.   On: 05/11/2024 14:40   DG Foot Complete Left Result Date: 05/11/2024 Please see detailed radiograph report in office note.   Microbiology: Results for orders placed or performed during the hospital encounter of 05/11/24  Blood culture (routine x 2)     Status: None (Preliminary result)  Collection Time: 05/11/24  3:10 PM   Specimen: BLOOD LEFT FOREARM  Result Value Ref Range Status   Specimen Description BLOOD LEFT FOREARM  Final   Special Requests   Final    BOTTLES DRAWN AEROBIC AND ANAEROBIC Blood Culture adequate volume   Culture   Final    NO GROWTH 2 DAYS Performed at [facility], [address] [place], [place] [address]    Report Status PENDING  Incomplete  Blood culture (routine x 2)     Status: None (Preliminary result)   Collection Time: 05/11/24  3:20 PM   Specimen: BLOOD   Result Value Ref Range Status   Specimen Description BLOOD RIGHT ANTECUBITAL  Final   Special Requests   Final    BOTTLES DRAWN AEROBIC AND ANAEROBIC Blood Culture adequate volume   Culture   Final    NO GROWTH 2 DAYS Performed at [facility], [address], [place], [place] [address]    Report Status PENDING  Incomplete  Resp panel by RT-PCR (RSV, Flu A&B, Covid) Anterior Nasal Swab     Status: None   Collection Time: 05/11/24  3:23 PM   Specimen: Anterior Nasal Swab  Result Value Ref Range Status   SARS Coronavirus 2 by RT PCR NEGATIVE NEGATIVE Final   Influenza A by PCR NEGATIVE NEGATIVE Final   Influenza B by PCR NEGATIVE NEGATIVE Final    Comment: (NOTE) The Xpert Xpress SARS-CoV-2/FLU/RSV plus assay is intended as an aid in the diagnosis of influenza from Nasopharyngeal swab specimens and should not be used as a sole basis for treatment. Nasal washings and aspirates are unacceptable for Xpert Xpress SARS-CoV-2/FLU/RSV testing.  Fact Sheet for Patients: BloggerCourse.com  Fact Sheet for Healthcare Providers: SeriousBroker.it  This test is not yet approved or cleared by the [place]  FDA and has been authorized for detection and/or diagnosis of SARS-CoV-2 by FDA under an Emergency Use Authorization (EUA). This EUA will remain in effect (meaning this test can be used) for the duration of the COVID-19 declaration under Section 564(b)(1) of the Act, 21 U.S.C. section 360bbb-3(b)(1), unless the authorization is terminated or revoked.     Resp Syncytial Virus by PCR NEGATIVE NEGATIVE Final    Comment: (NOTE) Fact Sheet for Patients: BloggerCourse.com  Fact Sheet for Healthcare Providers: SeriousBroker.it  This test is not yet approved or cleared by the [place]  FDA and has been authorized for detection and/or diagnosis of SARS-CoV-2 by FDA under an  Emergency Use Authorization (EUA). This EUA will remain in effect (meaning this test can be used) for the duration of the COVID-19 declaration under Section 564(b)(1) of the Act, 21 U.S.C. section 360bbb-3(b)(1), unless the authorization is terminated or revoked.  Performed at [facility], [address] [place], [place] [address]   Aerobic/Anaerobic Culture w Gram Stain (surgical/deep wound)     Status: None (Preliminary result)   Collection Time: 05/12/24 12:33 PM   Specimen: Path fluid; Body Fluid  Result Value Ref Range Status   Specimen Description TISSUE  Final   Special Requests LEFT FOOT ULCER  Final   Gram Stain NO WBC SEEN RARE GRAM POSITIVE COCCI IN PAIRS   Final   Culture   Final    FEW PROTEUS MIRABILIS SUSCEPTIBILITIES TO FOLLOW CULTURE REINCUBATED FOR BETTER GROWTH Performed at [facility], [address], [place], [place] [address]    Report Status PENDING  Incomplete   *Note: Due to a large number  of results and/or encounters for the requested time period, some results have not been displayed. A complete set of results can be found in Results Review.    Labs: CBC: Recent Labs  Lab 05/11/24 1206 05/11/24 1214 05/13/24 0409  WBC 16.1*  --  13.9*  NEUTROABS 13.9*  --   --   HGB 11.4* 13.3 9.8*  HCT 36.7* 39.0 30.4*  MCV 83.0  --  81.5  PLT 417*  --  415*   Basic Metabolic Panel: Recent Labs  Lab 05/11/24 1206 05/11/24 1214 05/12/24 0341 05/13/24 0409  NA 132* 134* 136 134*  K 4.1 4.2 4.2 4.4  CL 90* 93* 91* 93*  CO2 28  --  21* 26  GLUCOSE 195* 197* 138* 134*  BUN 25* 24* 35* 55*  CREATININE 7.47* 7.60* 9.46* 12.80*  CALCIUM  8.8*  --  9.2 9.2   Liver Function Tests: Recent Labs  Lab 05/11/24 1206 05/12/24 0341  AST 21 23  ALT 17 19  ALKPHOS 44 52  BILITOT 0.5 0.6  PROT 8.1 8.1  ALBUMIN  2.4* 2.3*   CBG: Recent Labs  Lab 05/12/24 1606 05/12/24 2132 05/13/24 0651 05/13/24 1126 05/13/24 1246  GLUCAP 197* 199* 133*  127* 110*    Discharge time spent: approximately 45 minutes spent on discharge counseling, evaluation of patient on day of discharge, and coordination of discharge planning with nursing, social work, pharmacy and case management  Signed: Lonni SHAUNNA Dalton, MD [facility] 05/13/2024

## 2024-05-13 NOTE — Progress Notes (Signed)
 [facility] Progress Note   Subjective:    Seen and examined patient on HD. Denies any acute complaints and tolerating UFG 2.5L. S/p I&D with toe/metatarsal resection yesterday.  Objective Vitals:   05/13/24 0840 05/13/24 0900 05/13/24 0930 05/13/24 1000  BP: (!) 117/50 128/64 134/62 (!) 127/58  Pulse: 87 81 89 87  Resp: (!) 27 (!) 32 (!) 27 16  Temp:      TempSrc:      SpO2: 99% 100% 96% 99%  Weight:      Height:       Physical Exam General: Awake, Jeremy [name], NAD Heart: S1 and S2; No murmurs, gallops, or rubs Lungs: Clear throughout Abdomen: Soft and non-tender Extremities: No LE edema; L foot bandaged Dialysis Access: L [facility]   Filed Weights   05/12/24 1109 05/13/24 0822  Weight: 104.3 kg 115.4 kg    Intake/Output Summary (Last 24 hours) at 05/13/2024 1030 Last data filed at 05/12/2024 1259 Gross per 24 hour  Intake 200 ml  Output 10 ml  Net 190 ml    Additional Objective Labs: Basic Metabolic Panel: Recent Labs  Lab 05/11/24 1206 05/11/24 1214 05/12/24 0341 05/13/24 0409  NA 132* 134* 136 134*  K 4.1 4.2 4.2 4.4  CL 90* 93* 91* 93*  CO2 28  --  21* 26  GLUCOSE 195* 197* 138* 134*  BUN 25* 24* 35* 55*  CREATININE 7.47* 7.60* 9.46* 12.80*  CALCIUM  8.8*  --  9.2 9.2   Liver Function Tests: Recent Labs  Lab 05/11/24 1206 05/12/24 0341  AST 21 23  ALT 17 19  ALKPHOS 44 52  BILITOT 0.5 0.6  PROT 8.1 8.1  ALBUMIN  2.4* 2.3*   No results for input(s): LIPASE, AMYLASE in the last 168 hours. CBC: Recent Labs  Lab 05/11/24 1206 05/11/24 1214 05/13/24 0409  WBC 16.1*  --  13.9*  NEUTROABS 13.9*  --   --   HGB 11.4* 13.3 9.8*  HCT 36.7* 39.0 30.4*  MCV 83.0  --  81.5  PLT 417*  --  415*   Blood Culture    Component Value Date/Time   SDES TISSUE 05/12/2024 1233   SPECREQUEST LEFT FOOT ULCER 05/12/2024 1233   CULT  05/12/2024 1233    CULTURE REINCUBATED FOR BETTER GROWTH Performed at [facility], [address]  [place], [place] [address]    REPTSTATUS PENDING 05/12/2024 1233    Cardiac Enzymes: No results for input(s): CKTOTAL, CKMB, CKMBINDEX, TROPONINI in the last 168 hours. CBG: Recent Labs  Lab 05/12/24 1046 05/12/24 1308 05/12/24 1606 05/12/24 2132 05/13/24 0651  GLUCAP 128* 121* 197* 199* 133*   Iron  Studies: No results for input(s): IRON , TIBC, TRANSFERRIN, FERRITIN in the last 72 hours. Lab Results  Component Value Date   INR 1.2 05/11/2024   INR 1.3 (H) 09/05/2023   INR 1.2 07/15/2022   Studies/Results: DG Foot 2 Views Left Result Date: 05/12/2024 CLINICAL DATA:  Postop. EXAM: LEFT FOOT - 2 VIEW COMPARISON:  Preoperative imaging. FINDINGS: Interval resection of the second toe proximal phalanx and distal aspect of the second metatarsal. Expected postoperative changes in the soft tissues. Previous postsurgical change of the third and fourth ray. Stable foot alignment. IMPRESSION: Interval resection of the second toe proximal phalanx and distal aspect of the second metatarsal. Electronically Signed   By: Andrea Gasman M.D.   On: 05/12/2024 14:58   VAS US  ABI WITH/WO TBI Result Date: 05/12/2024  LOWER EXTREMITY DOPPLER STUDY Patient Name:  Jeremy Jeremy [name]  Date of Exam:   05/12/2024 Medical Rec #: [id number]        Accession #:    [id number] Date of Birth: [birth year]        Patient Gender: M Patient Age:   43 years Exam Location:  [facility] Procedure:      VAS US  ABI WITH/WO TBI Referring Phys: HAMP FONDAW --------------------------------------------------------------------------------  Indications: Rest pain, ulceration, and gangrene. High Risk Factors: Hypertension, hyperlipidemia, Diabetes.  Comparison Study: Previous study on 2.17.2025. Performing Technologist: Edilia Elden Appl  Examination Guidelines: A complete evaluation includes at minimum, Doppler waveform signals and systolic blood pressure reading at the level of bilateral brachial, anterior tibial, and  posterior tibial arteries, when vessel segments are accessible. Bilateral testing is considered an integral part of a complete examination. Photoelectric Plethysmograph (PPG) waveforms and toe systolic pressure readings are included as required and additional duplex testing as needed. Limited examinations for reoccurring indications may be performed as noted.  ABI Findings: +---------+------------------+-----+---------+--------+ Right    Rt Pressure (mmHg)IndexWaveform Comment  +---------+------------------+-----+---------+--------+ Brachial 157                    triphasic         +---------+------------------+-----+---------+--------+ PTA      226               1.44 triphasic         +---------+------------------+-----+---------+--------+ DP       223               1.42 triphasic         +---------+------------------+-----+---------+--------+ Burnetta Plumber               1.36 Normal            +---------+------------------+-----+---------+--------+ +---------+------------------+-----+-----------+-------+ Left     Lt Pressure (mmHg)IndexWaveform   Comment +---------+------------------+-----+-----------+-------+ Brachial 155                    triphasic          +---------+------------------+-----+-----------+-------+ PTA      211               1.34 multiphasic        +---------+------------------+-----+-----------+-------+ DP       201               1.28 multiphasic        +---------+------------------+-----+-----------+-------+ Great Toe254               1.62 Normal             +---------+------------------+-----+-----------+-------+ +-------+-----------+----------------+------------+------------+ ABI/TBIToday's ABIToday's TBI     Previous ABIPrevious TBI +-------+-----------+----------------+------------+------------+ Right  1.44       1.36                                     +-------+-----------+----------------+------------+------------+  Left   1.34       Noncompressible.                         +-------+-----------+----------------+------------+------------+  Summary: Right: Resting right ankle-brachial index indicates noncompressible right lower extremity arteries. The right toe-brachial index is abnormal.  Left: Resting left ankle-brachial index indicates noncompressible left lower extremity arteries. The left toe-brachial index is abnormal.  *See table(s) above for measurements and observations.  Electronically signed by Lonni Gaskins MD on 05/12/2024 at 2:34:40 PM.  Final    MR FOOT LEFT WO CONTRAST Result Date: 05/11/2024 CLINICAL DATA:  Ulceration of the left plantar foot underlying the distal second metatarsal. History of prior partial third ray amputation and partial amputation of the fourth proximal phalanx. Concern for osteomyelitis. EXAM: MRI OF THE LEFT FOOT WITHOUT CONTRAST TECHNIQUE: Multiplanar, multisequence MR imaging of the left forefoot was performed. No intravenous contrast was administered. COMPARISON:  Left foot radiographs dated 05/10/2024. MRI of the left foot dated 10/26/2023. FINDINGS: Bones/Joint/Cartilage Soft tissue ulceration at the plantar forefoot, overlying the second metatarsal head, with sinus tract extending through the plantar soft tissues to the level of the underlying second metatarsal head/second MTP joint. There is T2 hyperintense marrow signal abnormality with corresponding T1 hypointensity of the second metatarsal head and neck and the second proximal phalanx with a small second MTP joint effusion, most compatible with acute osteomyelitis/septic arthritis. Extensive surrounding soft tissue edema at the level of the second MTP joint and through the mid second digit with surrounding foci of susceptibility artifact, likely reflecting soft tissue air. No marrow signal abnormality identified elsewhere to suggest osteomyelitis. Postsurgical changes related to prior amputation of the third ray through  the base of the third metatarsal and partial amputation of the base of the fourth proximal phalanx. Ligaments Lisfranc ligament is intact. Muscles and Tendons Postoperative changes related to prior amputations. Atrophy of the intrinsic foot musculature, likely reflects chronic denervation changes. No significant tenosynovitis. Soft tissue Soft tissue ulceration at the plantar forefoot with sinus tract extending through the plantar soft tissues to the level of the underlying second metatarsal head/second MTP joint. Associated surrounding soft tissue edema with foci of susceptibility artifact, compatible with soft tissue air, which may be communicating from the overlying wound, however, gas-forming infection can not be entirely excluded. No drainable loculated fluid collection. Postoperative changes related to prior amputations. IMPRESSION: 1. Soft tissue ulceration at the plantar forefoot with sinus tract extending to the level of the underlying second metatarsal head/second MTP joint. Marrow signal abnormality of the second metatarsal head and neck and the second proximal phalanx with a small second MTP joint effusion is most compatible with acute osteomyelitis/septic arthritis. Extensive surrounding soft tissue edema at second MTP joint and through the mid second digit with surrounding foci of susceptibility artifact, likely reflecting soft tissue air, which may be communicating from the overlying wound, however, gas-forming infection can not be excluded. No drainable fluid collection. 2. 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 Electronically Signed   By: Harrietta Sherry M.D.   On: 05/11/2024 14:40   DG Foot Complete Left Result Date: 05/11/2024 Please see detailed radiograph report in office note.   Medications:  ampicillin -sulbactam (UNASYN ) IV 3 g (05/12/24 2135)   anticoagulant sodium  citrate     vancomycin       atorvastatin   80 mg Oral Daily   carvedilol   50 mg Oral BID   Chlorhexidine  Gluconate Cloth  6 each Topical Daily   heparin  sodium (porcine)  4,200 Units Intracatheter Once   hydrALAZINE   50 mg Oral TID   insulin  aspart  0-9 Units Subcutaneous TID WC   insulin  glargine  15 Units Subcutaneous QPM   multivitamin  1 tablet Oral Daily   pantoprazole   20 mg Oral Daily   sevelamer  carbonate  2,400 mg Oral TID with meals   sodium chloride  flush  10-40 mL Intracatheter Q12H  sodium chloride  flush  3 mL Intravenous Q12H   Tenapanor  HCl (CKD)  30 mg Oral BID    Dialysis Orders: MWF - [facility] 4[facility], 500/800, EDW 111kg, 2K/2Ca bath, TDC in L chest, 5000 unit initial + 4000 unit mid-run bolus  Assessment/Plan:  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 Per podiatry.  ESRD:  Continue HD on MWF schedule -> on HD  Hypertension/volume: BP controlled, no edema on exam.  Anemia: Hgb > 12, no ESA  Metabolic bone disease: CorrCa high side, no VDRA, resume binders with meals.  T2DM  Charmaine Piety, NP [facility] 05/13/2024,10:30 AM  LOS: 2 days

## 2024-05-13 NOTE — Progress Notes (Signed)
 Discharge summary (AVS) has been provided by d/c staff nurse, Pt d/c to home as ordered.wound care supply& TOC  provided. NO complaints. He remains alert/oriented in no apparent distress. Family is responsible for his transport.

## 2024-05-13 NOTE — Evaluation (Signed)
 Physical Therapy Evaluation Patient Details Name: Jeremy [name] MRN: [id number] DOB: 1980-11-18 Today's Date: 05/13/2024  History of Present Illness  Jeremy [name] is a 43 y.o. male admitted 05/11/24 for osteomyelitis of left foot. Pt s/p I&D of left foot second MPJ with resection proximal phalanx and second metatarsal head and neck 9/4. 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   Clinical Impression  Pt admitted with above diagnosis. PTA, pt was independent with functional mobility, ADLs/IADLs, and driving. He lives with family in a one story house with 3 STE and railings. Pt currently with functional limitations due to the deficits listed below (see PT Problem List). He performed bed mobility independently and required supervision for transfers, gait, and stairs without AD. Pt ambulated ~262ft with an antalgic gait pattern. He ascended/descended five stairs twice with BUE support. Pt will benefit from acute skilled PT to increase his independence and safety with mobility to allow discharge. He appears to be close to baseline function, no follow-up PT needs.     If plan is discharge home, recommend the following: Assist for transportation;Help with stairs or ramp for entrance   Can travel by private vehicle        Equipment Recommendations None recommended by PT  Recommendations for Other Services       Functional Status Assessment Patient has had a recent decline in their functional status and demonstrates the ability to make significant improvements in function in a reasonable and predictable amount of time.     Precautions / Restrictions Precautions Precautions: Fall Recall of Precautions/Restrictions: Intact Required Braces or Orthoses: Other Brace Other Brace: Post-Op Shoe LLE Restrictions Weight Bearing Restrictions Per Provider Order: Yes LLE Weight Bearing Per Provider Order: Weight bearing as tolerated      Mobility  Bed  Mobility Overal bed mobility: Independent             General bed mobility comments: Pt performed supine<>sit from a flat bed without bed rails or increased time. No cues for sequencing.    Transfers Overall transfer level: Needs assistance Equipment used: None Transfers: Sit to/from Stand Sit to Stand: Supervision           General transfer comment: Pt stood from loweest bed height pushing up with BUE support. Good eccentric control.    Ambulation/Gait Ambulation/Gait assistance: Supervision Gait Distance (Feet): 250 Feet Assistive device: None Gait Pattern/deviations: Step-to pattern, Decreased stride length, Decreased stance time - left, Decreased weight shift to left, Antalgic, Wide base of support   Gait velocity interpretation: 1.31 - 2.62 ft/sec, indicative of limited community ambulator   General Gait Details: Pt ambulated by advancing LLE first followed by RLE, which would step to meet his opposite foot. Pt self-limited WBing on LLE d/t pain. He maintained upright posture, slight fwd lean when advancing LLE. Pt navigated room/hallway well without LOB.  Stairs Stairs: Yes Stairs assistance: Supervision Stair Management: Two rails, Alternating pattern, Forwards Number of Stairs: 5 (x2) General stair comments: Educated pt to ascend with RLE and descend with LLE. He utilized an alternating gait pattern with BUE support on railings. No LOB.  Wheelchair Mobility     Tilt Bed    Modified Rankin (Stroke Patients Only)       Balance Overall balance assessment: Mild deficits observed, not formally tested  Pertinent Vitals/Pain Pain Assessment Pain Assessment: 0-10 Pain Score: 7  Pain Location: L Foot Pain Descriptors / Indicators: Throbbing, Aching Pain Intervention(s): Monitored during session, Limited activity within patient's tolerance, Repositioned    Home Living Family/patient expects to be  discharged to:: Private residence Living Arrangements: Other relatives Available Help at Discharge: Family;Available PRN/intermittently Type of Home: House Home Access: Stairs to enter Entrance Stairs-Rails: Right;Left;Can reach both Entrance Stairs-Number of Steps: 3   Home Layout: One level Home Equipment: None      Prior Function Prior Level of Function : Independent/Modified Independent             Mobility Comments: Ambulates with AD. Denies fall history. ADLs Comments: Indep with ADLs/IADLs. Reports requiring some increased assist from family after dialysis d/t fatigue. His sister takes him to HD MWF.     Extremity/Trunk Assessment   Upper Extremity Assessment Upper Extremity Assessment: Overall WFL for tasks assessed;Right hand dominant    Lower Extremity Assessment Lower Extremity Assessment: Overall WFL for tasks assessed    Cervical / Trunk Assessment Cervical / Trunk Assessment: Normal  Communication   Communication Communication: No apparent difficulties    Cognition Arousal: Alert Behavior During Therapy: WFL for tasks assessed/performed   PT - Cognitive impairments: No apparent impairments                       PT - Cognition Comments: Pt A,Ox4. Pt was reluctant to answer questions about home set-up and PLOF, asking do I really need to tell you that? Provided education on the purpose of PT evaluation and the improve Following commands: Intact       Cueing Cueing Techniques: Verbal cues     General Comments General comments (skin integrity, edema, etc.): VSS on RA. Pt's L foot wrapped in dressing and in post-op shoe.    Exercises     Assessment/Plan    PT Assessment Patient needs continued PT services  PT Problem List Decreased balance;Decreased mobility;Pain       PT Treatment Interventions DME instruction;Gait training;Stair training;Functional mobility training;Therapeutic activities;Therapeutic exercise;Balance  training;Patient/family education    PT Goals (Current goals can be found in the Care Plan section)  Acute Rehab PT Goals Patient Stated Goal: Return Home PT Goal Formulation: With patient Time For Goal Achievement: 05/27/24 Potential to Achieve Goals: Good    Frequency Min 2X/week     Co-evaluation               AM-PAC PT 6 Clicks Mobility  Outcome Measure Help needed turning from your back to your side while in a flat bed without using bedrails?: None Help needed moving from lying on your back to sitting on the side of a flat bed without using bedrails?: None Help needed moving to and from a bed to a chair (including a wheelchair)?: A Little Help needed standing up from a chair using your arms (e.g., wheelchair or bedside chair)?: A Little Help needed to walk in hospital room?: A Little Help needed climbing 3-5 steps with a railing? : A Little 6 Click Score: 20    End of Session Equipment Utilized During Treatment:  (Pt refused Gait Belt) Activity Tolerance: Patient tolerated treatment well Patient left: in bed;with call bell/phone within reach Nurse Communication: Mobility status PT Visit Diagnosis: Difficulty in walking, not elsewhere classified (R26.2);Unsteadiness on feet (R26.81)    Time: 9270-9254 PT Time Calculation (min) (ACUTE ONLY): 16 min   Charges:   PT Evaluation $PT Eval  Low Complexity: 1 Low   PT General Charges $$ ACUTE PT VISIT: 1 Visit         Randall SAUNDERS, PT, DPT Acute Rehabilitation Services Office: [phone] Secure Chat Preferred  Jeremy [name] 05/13/2024, 8:03 AM

## 2024-05-13 NOTE — Anesthesia Postprocedure Evaluation (Signed)
 Anesthesia Post Note  Patient: Maxden Naji  Procedure(s) Performed: EXCISION, METATARSAL BONE, HEAD AND SECOND TOE AMPUTATION (Left: Toe)     Patient location during evaluation: PACU Anesthesia Type: MAC Level of consciousness: awake and alert Pain management: pain level controlled Vital Signs Assessment: post-procedure vital signs reviewed and stable Respiratory status: spontaneous breathing, nonlabored ventilation, respiratory function stable and patient connected to nasal cannula oxygen Cardiovascular status: stable and blood pressure returned to baseline Postop Assessment: no apparent nausea or vomiting Anesthetic complications: no   No notable events documented.  Last Vitals:  Vitals:   05/13/24 0840 05/13/24 0900  BP: (!) 117/50 128/64  Pulse: 87 81  Resp: (!) 27 (!) 32  Temp:    SpO2: 99% 100%    Last Pain:  Vitals:   05/13/24 0842  TempSrc:   PainSc: 7                  Thom JONELLE Peoples

## 2024-05-13 NOTE — Progress Notes (Signed)
  Subjective:  Patient ID: Jeremy [name], male    DOB: [birth year],  MRN: [id number]  No chief complaint on file.   DOS: 05/12/24 Procedure: 1.  Irrigation and debridement of second MPJ with resection proximal phalanx and second metatarsal head and neck, left foot   43 y.o. male seen for post op check. Pt seen in HD, he denies pain. We discussed plan for dressing changes to left foot wound and plan for long term abx.  Review of Systems: Negative except as noted in the HPI. Denies N/V/F/Ch.   Objective:   Constitutional Well developed. Well nourished.  Vascular Foot warm and well perfused. Capillary refill normal to all digits.   No calf pain with palpation  Neurologic Normal speech. Oriented to person, place, and time. Epicritic sensation diminished to left foot  Dermatologic L foot dorsal incision well coapted, mild maceration no active bleeding, plantar wound also healthy without evidence of purulence/ residual infection  Orthopedic: S/p L 2nd MPJ resection, proximal phalanx resection   Radiographs: Interval resection of the second toe proximal phalanx and distal aspect of the second metatarsal.  Pathology: pending  Micro: Rare GPC in [place]  Assessment:   1. Other acute osteomyelitis of left foot (HCC)   Osteomyelitis of L 2nd MPJ s/p resection 2nd proximal phalanx and 2nd metatarsal head/neck  Plan:  Patient was evaluated and treated and all questions answered.  POD # 1 s/p above procedures -Progressing as expected post op, changed dressing wounds look to be doing well no evidence residual abscess.  - Appreciate ID recs, difficult to say for certain if 2nd met resection site clear of OM though bone did seem healthy during the cut, path pending - plan for extended duration abx -XR: expected post op changes -WB Status: WBAT in post op shoe, though decrease activity as much as able due to bleeding -Sutures: remain intact 2-3 weeks. -Medications/ABX: Abx per ID, plan for  4 weeks abx -Dressing: Changed today and new 1 in iodoform gauze packed in plantar wound, change every other day. Orders entered for home health RN for 2 x weekly dsg change.  - F/u Plan: Pt to follow up in 1-2 weeks. Office to call to arrange. Will sign off, please msg with questions         [name], DPM [facility]

## 2024-05-13 NOTE — Consult Note (Signed)
 [facility]    Date of Admission:  05/11/2024     Reason for Consult: diabetic foot infection    Referring Provider: Jonel     Lines:  Hd line  Abx: Vanc Amp-sulb        Assessment: 43 y.o. 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  We discussed with podiatry who is concerned about residual osseous infection and would like long tx course; path is pending. I do agree even with path, the extensive nature of abscess/sinus tract do pose possibility of skipped lesion    Plan: Will f/u cultures and adjust abx as needed, although vanc/ceftaz would cover these organisms 6 weeks vanc/ceftaz three times per week with dialysis starting 9/4 Maintain standard isolation precaution Ok to discharge from id standpoint Discussed with dr Jonel  OPAT Orders Discharge antibiotics to be given via hd line during hd  Aim for Vancomycin  trough 15-20 or AUC 400-550 (unless otherwise indicated) Duration: 6 weeks from 9/4 End Date: 10/16  [facility] Care Per Protocol:  Home health RN for IV administration and teaching; PICC line care and labs.    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  Rongeur was used to harvest a deep tissue culture from the plantar ulceration.  Wound was further debrided excisionally with rongeur and 10 blade scalpel to the level of sec metatarsal head.  There was necrotic and fibrotic material expressed from the ulceration and there was small amount of purulence drained from the second MPJ.   Attention was then directed dorsally to the dorsal aspect of the second MPJ.  A longitudinal incision was made approximately 5 cm in length overlying the joint.  Dissection was carried down to bone level and small amount of purulence was noted about the joint.  Next the proximal phalanx was disarticulated at the PIPJ and MPJ.  This was then removed in toto and passed the back table was sent for pathology.  The head and neck second metatarsal was then exposed with 15 blade.  Sagittal saw was then used to make transverse osteotomy through the neck of the second metatarsal.  2nd metatarsal head neck was then  resected and passed off to get sent for pathology.   Next the surgical site was irrigated thoroughly with 3 L of sterile saline via power pulse lavage.  At completion of the procedure there was no further purulence or necrotic or infected tissues noted in the wound bed.  Next the dorsal incision was closed under minimal tension with 3-0 Prolene.  The plantar wound was left open to continue to drain and was packed with 1 inch iodoform packing strip.   The surgical site was then dressed with Xeroform 4 x 4 ABD  pad Kerlix Ace. The patient tolerated both the procedure and anesthesia well with vital signs stable throughout. The patient was transferred in good condition and all vital signs stable  from the OR to recovery under the discretion of anesthesia.     Patient has no complaint at this time  Wound cx growing proteus but has gpc in pairs. He has staph aureus and corynebacterium foot infection 10/2023    He had a short course of doxycyline a month prior to admission   Family History  Problem Relation Age of Onset   Diabetes Mellitus II Mother        died @ 35.   Gastric cancer Mother    CAD Father        died @ 58.   Heart attack Father    Congestive Heart Failure Father    Diabetes Mellitus II Sister    CAD Sister        s/p PCI - age 73.    Social History   Tobacco Use   Smoking status: Never   Smokeless tobacco: Never  Vaping Use   Vaping status: Never Used  Substance Use Topics   Alcohol use: No    Alcohol/week: 0.0 standard drinks of alcohol   Drug use: Yes    Types: Marijuana    Comment: last used 09/15/2020    Allergies  Allergen Reactions   Acyclovir And Related Hives   Imdur  [Isosorbide  Nitrate] Other (See Comments)    Headaches from nitrate    Review of Systems: ROS All Other ROS was negative, except mentioned above   Past Medical History:  Diagnosis Date   Abscess of left groin    Acute blood loss anemia 11/11/2013   Anemia in chronic kidney disease (CKD)    Asthma    Boil of scrotum 11/21/2015   Chest pain    a. 01/2015 Lexiscan  MV: EF 28%, inferior, inferolateral, apical ischemia;  b. 01/2015 Cath: nl cors, PCWP 18 mmHg, CO 9.38 L/min, CI 3.53 L/min/m^2.   CHF (congestive heart failure) (HCC)    Depression    Situational   ESRD (end stage renal disease) (HCC)    TTHS- [place]    Essential hypertension    Family history of adverse reaction to anesthesia    sister- it was too much for her heart died   Flash pulmonary edema (HCC)  09/18/2022   GERD (gastroesophageal reflux disease)    Hyperlipidemia    Membranous glomerulonephritis    Morbid obesity (HCC)    Nonischemic cardiomyopathy (HCC)    a. 01/2015 Echo: EF 20-25%, diff HK, Gr 2 DD, Triv AI, mildly dil LA and Ao root.   PONV (postoperative nausea and vomiting)    Seizures (HCC) 02/29/2020   electralytes were out of wack.   Type II diabetes mellitus (HCC)    a. 01/2015 HbA1c = 8.9.       Scheduled Meds:  atorvastatin   80  mg Oral Daily   carvedilol   50 mg Oral BID   Chlorhexidine  Gluconate Cloth  6 each Topical Daily   hydrALAZINE   50 mg Oral TID   insulin  aspart  0-9 Units Subcutaneous TID WC   insulin  glargine  15 Units Subcutaneous QPM   multivitamin  1 tablet Oral Daily   pantoprazole   20 mg Oral Daily   sevelamer  carbonate  2,400 mg Oral TID with meals   sodium chloride  flush  10-40 mL Intracatheter Q12H   sodium chloride  flush  3 mL Intravenous Q12H   Tenapanor  HCl (CKD)  30 mg Oral BID   Continuous Infusions:  ampicillin -sulbactam (UNASYN ) IV 3 g (05/12/24 2135)   vancomycin  1,000 mg (05/13/24 1110)   PRN Meds:.acetaminophen , HYDROmorphone  (DILAUDID ) injection, melatonin, methocarbamol , nitroGLYCERIN , ondansetron  **OR** ondansetron  (ZOFRAN ) IV, oxyCODONE , sodium chloride  flush, sodium chloride  flush   OBJECTIVE: Blood pressure (!) 133/59, pulse 92, temperature 98.6 F (37 C), temperature source Oral, resp. rate 18, height 6' 5 (1.956 m), weight 113.5 kg, SpO2 96%.  Physical Exam  General/constitutional: no distress, pleasant HEENT: Normocephalic, PER, Conj Clear, EOMI, Oropharynx clear Neck supple CV: rrr no mrg Lungs: clear to auscultation, normal respiratory effort Abd: Soft, Nontender Ext: no edema Skin: No Rash Neuro: nonfocal MSK: left foot dressing c/d   Central line presence: left chest hd cath site no purulence/tenderness   Lab Results Lab Results  Component Value Date   WBC 13.9 (H) 05/13/2024   HGB 9.8 (L)  05/13/2024   HCT 30.4 (L) 05/13/2024   MCV 81.5 05/13/2024   PLT 415 (H) 05/13/2024    Lab Results  Component Value Date   CREATININE 12.80 (H) 05/13/2024   BUN 55 (H) 05/13/2024   NA 134 (L) 05/13/2024   K 4.4 05/13/2024   CL 93 (L) 05/13/2024   CO2 26 05/13/2024    Lab Results  Component Value Date   ALT 19 05/12/2024   AST 23 05/12/2024   ALKPHOS 52 05/12/2024   BILITOT 0.6 05/12/2024      Microbiology: Recent Results (from the past 240 hours)  Blood culture (routine x 2)     Status: None (Preliminary result)   Collection Time: 05/11/24  3:10 PM   Specimen: BLOOD LEFT FOREARM  Result Value Ref Range Status   Specimen Description BLOOD LEFT FOREARM  Final   Special Requests   Final    BOTTLES DRAWN AEROBIC AND ANAEROBIC Blood Culture adequate volume   Culture   Final    NO GROWTH 2 DAYS Performed at [facility], [address], [place], [place] [address]    Report Status PENDING  Incomplete  Blood culture (routine x 2)     Status: None (Preliminary result)   Collection Time: 05/11/24  3:20 PM   Specimen: BLOOD  Result Value Ref Range Status   Specimen Description BLOOD RIGHT ANTECUBITAL  Final   Special Requests   Final    BOTTLES DRAWN AEROBIC AND ANAEROBIC Blood Culture adequate volume   Culture   Final    NO GROWTH 2 DAYS Performed at [facility], [address], [place], [place] [address]    Report Status PENDING  Incomplete  Resp panel by RT-PCR (RSV, Flu A&B, Covid) Anterior Nasal Swab     Status: None   Collection Time: 05/11/24  3:23 PM   Specimen: Anterior Nasal Swab  Result Value Ref Range Status   SARS Coronavirus 2 by RT PCR NEGATIVE NEGATIVE Final   Influenza  A by PCR NEGATIVE NEGATIVE Final   Influenza B by PCR NEGATIVE NEGATIVE Final    Comment: (NOTE) The Xpert Xpress SARS-CoV-2/FLU/RSV plus assay is intended as an aid in the diagnosis of influenza from Nasopharyngeal swab specimens and should not be used as a sole basis for  treatment. Nasal washings and aspirates are unacceptable for Xpert Xpress SARS-CoV-2/FLU/RSV testing.  Fact Sheet for Patients: BloggerCourse.com  Fact Sheet for Healthcare Providers: SeriousBroker.it  This test is not yet approved or cleared by the [place]  FDA and has been authorized for detection and/or diagnosis of SARS-CoV-2 by FDA under an Emergency Use Authorization (EUA). This EUA will remain in effect (meaning this test can be used) for the duration of the COVID-19 declaration under Section 564(b)(1) of the Act, 21 U.S.C. section 360bbb-3(b)(1), unless the authorization is terminated or revoked.     Resp Syncytial Virus by PCR NEGATIVE NEGATIVE Final    Comment: (NOTE) Fact Sheet for Patients: BloggerCourse.com  Fact Sheet for Healthcare Providers: SeriousBroker.it  This test is not yet approved or cleared by the [place]  FDA and has been authorized for detection and/or diagnosis of SARS-CoV-2 by FDA under an Emergency Use Authorization (EUA). This EUA will remain in effect (meaning this test can be used) for the duration of the COVID-19 declaration under Section 564(b)(1) of the Act, 21 U.S.C. section 360bbb-3(b)(1), unless the authorization is terminated or revoked.  Performed at [facility], [address] [place], [place] [address]   Aerobic/Anaerobic Culture w Gram Stain (surgical/deep wound)     Status: None (Preliminary result)   Collection Time: 05/12/24 12:33 PM   Specimen: Path fluid; Body Fluid  Result Value Ref Range Status   Specimen Description TISSUE  Final   Special Requests LEFT FOOT ULCER  Final   Gram Stain NO WBC SEEN RARE GRAM POSITIVE COCCI IN PAIRS   Final   Culture   Final    FEW PROTEUS MIRABILIS SUSCEPTIBILITIES TO FOLLOW CULTURE REINCUBATED FOR BETTER GROWTH Performed at [facility], [address],  [place], [place] [address]    Report Status PENDING  Incomplete     Serology:    Imaging: If present, new imagings (plain films, ct scans, and mri) have been personally visualized and interpreted; radiology reports have been reviewed. Decision making incorporated into the Impression / Recommendations.   05/11/24 mri left foot 1. Soft tissue ulceration at the plantar forefoot with sinus tract extending to the level of the underlying second metatarsal head/second MTP joint. Marrow signal abnormality of the second metatarsal head and neck and the second proximal phalanx with a small second MTP joint effusion is most compatible with acute osteomyelitis/septic arthritis. Extensive surrounding soft tissue edema at second MTP joint and through the mid second digit with surrounding foci of susceptibility artifact, likely reflecting soft tissue air, which may be communicating from the overlying wound, however, gas-forming infection can not be excluded. No drainable fluid collection. 2. 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  Constance ONEIDA Passer, MD [facility] [phone] pager    05/13/2024, 2:50 PM

## 2024-05-13 NOTE — Plan of Care (Signed)
  Problem: Education: Goal: Ability to describe self-care measures that may prevent or decrease complications (Diabetes Survival Skills Education) will improve Outcome: Progressing   Problem: Clinical Measurements: Goal: Will remain free from infection Outcome: Progressing

## 2024-05-13 NOTE — Progress Notes (Addendum)
 Contacted by attending regarding availability of abx Vanc and Ceftaz at out-pt HD clinic. Contacted clinic [facility] at this time. Informed of possible d/c today. Clinic Manager stated they will be able to have both of these abx by his next out-pt treatment on Monday. Nephrology made aware at this time as well.   Jeremy [name] Dialysis Navigator [phone]

## 2024-05-13 NOTE — Progress Notes (Signed)
 Received patient in bed to unit.  Alert and oriented.  Informed consent signed and in chart.   TX duration:3.5 hours  Patient tolerated well.  Transported back to the room  Alert, without acute distress.  Hand-off given to patient's nurse.   Access used: Left HD internal jugular Cath Access issues: none  Total UF removed: 2.5L Medication(s) given: Tylenol , Vancomycin    05/13/24 1220  Vitals  Temp 98.2 F (36.8 C)  BP (!) 124/58  Pulse Rate 87  Resp 19  Oxygen Therapy  SpO2 99 %  O2 Device Room Air  Pulse Oximetry Type Continuous  During Treatment Monitoring  Dialysate Potassium Concentration 3  Dialysate Calcium  Concentration 2.5  Duration of HD Treatment -hour(s) 3.5 hour(s)  HD Safety Checks Performed Yes  Intra-Hemodialysis Comments Tx completed;Tolerated well  Post Treatment  Dialyzer Clearance Clear  Liters Processed 84  Fluid Removed (mL) 2500 mL  Tolerated HD Treatment Yes  Hemodialysis Catheter Left Internal jugular Double lumen Permanent (Tunneled)  Placement Date/Time: 03/04/24 0811   Placed prior to admission: No  Serial / Lot #: [id number]  Expiration Date: 11/05/28  Time Out: Correct patient;Correct site;Correct procedure  Maximum sterile barrier precautions: Hand hygiene;Cap;Sterile gown;Mask...  Site Condition No complications  Blue Lumen Status Flushed;Antimicrobial dead end cap;Heparin  locked  Red Lumen Status Flushed;Antimicrobial dead end cap;Heparin  locked  Purple Lumen Status N/A  Catheter fill solution Heparin  1000 units/ml  Catheter fill volume (Arterial) 2.1 cc  Catheter fill volume (Venous) 2.1  Dressing Type Transparent  Dressing Status Antimicrobial disc/dressing in place;Clean, Dry, Intact  Drainage Description None  Dressing Change Due 05/19/24  Post treatment catheter status Capped and Clamped     Camellia Brasil LPN [facility]

## 2024-05-14 ENCOUNTER — Other Ambulatory Visit (HOSPITAL_COMMUNITY): Payer: Self-pay

## 2024-05-14 MED ORDER — CARVEDILOL 25 MG PO TABS
50.0000 mg | ORAL_TABLET | Freq: Two times a day (BID) | ORAL | 4 refills | Status: AC
  Filled 2024-05-14: qty 360, 90d supply, fill #0

## 2024-05-14 NOTE — TOC Transition Note (Signed)
 Transition of Care - Initial Contact after Hospitalization  Date of discharge: 05/13/2024  Date of contact: 05/14/24  Method: Phone Spoke to: Patient  Patient contacted to discuss transition of care from recent inpatient hospitalization. Patient was admitted to [facility] from 05/11/24 to 05/13/24 discharge diagnosis of left foot osteomyelitis will be on ABXs with HD until 06/22/24.  The discharge medication list was reviewed. Patient understands the changes and has no concerns.   Patient will return to his outpatient HD unit on: Monday 9/8 at [facility]  Charmaine Piety, NP

## 2024-05-14 NOTE — Discharge Planning (Signed)
 [facility] Patient Discharge Orders- [facility] CLINIC: [place]  Patient's name: Jeremy [name] Admit/DC Dates: 05/11/2024 - 05/13/2024  Discharge Diagnoses: Left foot osteomyelitis - see below for ABX regimen    Aranesp : Given: No    Last Hgb: 9.8 PRBC's Given: No  ESA dose for discharge: Start Micera per protocol  IV Iron  dose at discharge: No Fe while on IV ABXs  Heparin  change: No  EDW Change: No  Bath Change: No  Access intervention/Change: No  Hectorol change: No  Discharge Labs: Calcium  9.2   Albumin  2.3  K+ 4.4  IV Antibiotics: Yes Details: Left foot osteomyelitis. Continue IV Vancomycin  1GM IV and Ceftazidime  2GM IV with HD until 06/22/24.  On Coumadin?: No   OTHER/APPTS/LAB ORDERS:  Check weekly Vancomycin  troughs per protocol    D/C Meds to be reconciled by nurse after every discharge.  Completed By: Charmaine Piety, NP   Reviewed by: MD:______ RN_______

## 2024-05-15 ENCOUNTER — Other Ambulatory Visit (HOSPITAL_COMMUNITY): Payer: Self-pay

## 2024-05-15 LAB — AEROBIC/ANAEROBIC CULTURE W GRAM STAIN (SURGICAL/DEEP WOUND): Gram Stain: NONE SEEN

## 2024-05-16 ENCOUNTER — Other Ambulatory Visit (HOSPITAL_COMMUNITY): Payer: Self-pay

## 2024-05-16 DIAGNOSIS — M868X7 Other osteomyelitis, ankle and foot: Secondary | ICD-10-CM | POA: Diagnosis not present

## 2024-05-16 DIAGNOSIS — T8249XA Other complication of vascular dialysis catheter, initial encounter: Secondary | ICD-10-CM | POA: Diagnosis not present

## 2024-05-16 DIAGNOSIS — N2581 Secondary hyperparathyroidism of renal origin: Secondary | ICD-10-CM | POA: Diagnosis not present

## 2024-05-16 DIAGNOSIS — D631 Anemia in chronic kidney disease: Secondary | ICD-10-CM | POA: Diagnosis not present

## 2024-05-16 DIAGNOSIS — D509 Iron deficiency anemia, unspecified: Secondary | ICD-10-CM | POA: Diagnosis not present

## 2024-05-16 DIAGNOSIS — Z992 Dependence on renal dialysis: Secondary | ICD-10-CM | POA: Diagnosis not present

## 2024-05-16 DIAGNOSIS — D689 Coagulation defect, unspecified: Secondary | ICD-10-CM | POA: Diagnosis not present

## 2024-05-16 DIAGNOSIS — N186 End stage renal disease: Secondary | ICD-10-CM | POA: Diagnosis not present

## 2024-05-16 LAB — CULTURE, BLOOD (ROUTINE X 2)
Culture: NO GROWTH
Culture: NO GROWTH
Special Requests: ADEQUATE
Special Requests: ADEQUATE

## 2024-05-16 LAB — SURGICAL PATHOLOGY

## 2024-05-16 MED ORDER — CARVEDILOL 25 MG PO TABS
50.0000 mg | ORAL_TABLET | Freq: Two times a day (BID) | ORAL | 3 refills | Status: AC
  Filled 2024-05-16 – 2024-08-23 (×2): qty 360, 90d supply, fill #0
  Filled 2024-08-24: qty 360, 90d supply, fill #1

## 2024-05-18 DIAGNOSIS — D631 Anemia in chronic kidney disease: Secondary | ICD-10-CM | POA: Diagnosis not present

## 2024-05-18 DIAGNOSIS — N186 End stage renal disease: Secondary | ICD-10-CM | POA: Diagnosis not present

## 2024-05-18 DIAGNOSIS — N2581 Secondary hyperparathyroidism of renal origin: Secondary | ICD-10-CM | POA: Diagnosis not present

## 2024-05-18 DIAGNOSIS — M868X7 Other osteomyelitis, ankle and foot: Secondary | ICD-10-CM | POA: Diagnosis not present

## 2024-05-18 DIAGNOSIS — Z992 Dependence on renal dialysis: Secondary | ICD-10-CM | POA: Diagnosis not present

## 2024-05-18 DIAGNOSIS — T8249XA Other complication of vascular dialysis catheter, initial encounter: Secondary | ICD-10-CM | POA: Diagnosis not present

## 2024-05-18 DIAGNOSIS — D689 Coagulation defect, unspecified: Secondary | ICD-10-CM | POA: Diagnosis not present

## 2024-05-18 DIAGNOSIS — D509 Iron deficiency anemia, unspecified: Secondary | ICD-10-CM | POA: Diagnosis not present

## 2024-05-19 ENCOUNTER — Telehealth: Payer: Self-pay | Admitting: Podiatry

## 2024-05-19 ENCOUNTER — Ambulatory Visit (INDEPENDENT_AMBULATORY_CARE_PROVIDER_SITE_OTHER): Admitting: Podiatry

## 2024-05-19 ENCOUNTER — Encounter: Payer: Self-pay | Admitting: Podiatry

## 2024-05-19 VITALS — Ht 77.0 in | Wt 250.0 lb

## 2024-05-19 DIAGNOSIS — L97524 Non-pressure chronic ulcer of other part of left foot with necrosis of bone: Secondary | ICD-10-CM

## 2024-05-19 DIAGNOSIS — Z9889 Other specified postprocedural states: Secondary | ICD-10-CM

## 2024-05-19 DIAGNOSIS — M86172 Other acute osteomyelitis, left ankle and foot: Secondary | ICD-10-CM

## 2024-05-19 DIAGNOSIS — I96 Gangrene, not elsewhere classified: Secondary | ICD-10-CM

## 2024-05-19 NOTE — Telephone Encounter (Signed)
 Notified pt surgery is scheduled at [facility] or on 9/17 surgery to start at 1215pm but the pre op team will call him and tell him the time he needs to arrive.

## 2024-05-20 ENCOUNTER — Telehealth: Payer: Self-pay | Admitting: Podiatry

## 2024-05-20 DIAGNOSIS — Z992 Dependence on renal dialysis: Secondary | ICD-10-CM | POA: Diagnosis not present

## 2024-05-20 DIAGNOSIS — N186 End stage renal disease: Secondary | ICD-10-CM | POA: Diagnosis not present

## 2024-05-20 DIAGNOSIS — D689 Coagulation defect, unspecified: Secondary | ICD-10-CM | POA: Diagnosis not present

## 2024-05-20 DIAGNOSIS — T8249XA Other complication of vascular dialysis catheter, initial encounter: Secondary | ICD-10-CM | POA: Diagnosis not present

## 2024-05-20 DIAGNOSIS — N2581 Secondary hyperparathyroidism of renal origin: Secondary | ICD-10-CM | POA: Diagnosis not present

## 2024-05-20 DIAGNOSIS — M868X7 Other osteomyelitis, ankle and foot: Secondary | ICD-10-CM | POA: Diagnosis not present

## 2024-05-20 DIAGNOSIS — D509 Iron deficiency anemia, unspecified: Secondary | ICD-10-CM | POA: Diagnosis not present

## 2024-05-20 DIAGNOSIS — D631 Anemia in chronic kidney disease: Secondary | ICD-10-CM | POA: Diagnosis not present

## 2024-05-20 NOTE — Telephone Encounter (Signed)
 DOS- 05/25/2024  AMPUTATION TOE, METATARSOPHALANGEAL JOINT LT- 71179 REPAIR, COMPLEX SURGICAL CLOSURE OF WOUND LT- 13131  [employer] EFFECTIVE DATE- 09/08/2021  PER FAX RECEIVED FROM [employer], NO PRIOR AUTHS ARE REQUIRED FOR CPT CODES 71179 AND 13131. DOCUMENTATION ATTACHED TO SURGICAL CONSENT PACKET.

## 2024-05-22 NOTE — Progress Notes (Signed)
 Subjective:  Patient ID: Jeremy [name], male    DOB: 05/12/1981,  MRN: [id number]  Chief Complaint  Patient presents with   Routine Post Op    hospital pov # 1 dos 05/12/24 EXCISION, METATARSAL BONE, HEAD AND SECOND TOE AMPUTATION left, wound is moist skin is peeling on dorsal foot 3rd toe black and hard    DOS: 05/12/24 Procedure: 1.  Irrigation and debridement of second MPJ with resection proximal phalanx and second metatarsal head and neck, left foot   43 y.o. male seen for post op check.  Patient presenting for first postop in the office after above procedure.  Now 1 week postop.  Reports that the second toe has been turning black and there has been some drainage from the dorsal foot wound.  He has been doing dressing changes as instructed with packing for the plantar wound.  Review of Systems: Negative except as noted in the HPI. Denies N/V/F/Ch.   Objective:   Constitutional Well developed. Well nourished.  Vascular Foot warm and well perfused. Capillary refill normal to all digits.   No calf pain with palpation  Neurologic Normal speech. Oriented to person, place, and time. Epicritic sensation diminished to left foot  Dermatologic L foot dorsal incision with   superficial dehiscence mild maceration.  The second toe is necrotic appearing much improved from prior.  Nonviable at this point in time.  Plantar foot wound appears to be healing in nicely with no active purulence or drainage, appears to have granulation tissue in the wound bed    Orthopedic: S/p L 2nd MPJ resection, proximal phalanx resection   Radiographs: Interval resection of the second toe proximal phalanx and distal aspect of the second metatarsal.  Pathology: A. LEFT SECOND TOE, AMPUTATION:  Marked acute cellulitis with gangrenous and coagulative necrosis  Proximal bone margin negative for osteomyelitis   Micro:  FEW PROTEUS MIRABILIS RARE ESCHERICHIA COLI RARE STREPTOCOCCUS AGALACTIAE TESTING AGAINST S.  AGALACTIAE NOT ROUTINELY PERFORMED DUE TO PREDICTABILITY OF AMP/PEN/VAN SUSCEPTIBILITY. RARE BACTEROIDES CACCAE BETA LACTAMASE POSITIVE    Assessment:   1. Gangrene of toe of left foot (HCC)   2. 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 Acute osteomyelitis of left ankle or foot (HCC)   4. Post-operative state    Osteomyelitis of L 2nd MPJ s/p resection 2nd proximal phalanx and 2nd metatarsal head/neck  Plan:  Patient was evaluated and treated and all questions answered.  POD # 7 s/p above procedures - Unfortunately the patient second toe is nonviable at this time.  Was concerned that this may happen given resection of the proximal phalanx and second metatarsal head and neck. -Discussed that he will require amputation of the second toe which debridement of the dorsal wound hopefully with closure. -I advised the patient go to the emergency department and be admitted for this procedure however he wished to do it on an outpatient basis which I think is reasonable given no systemic symptoms and will does not appear to be severely infected at this time. -Will plan to schedule patient outpatient procedure next week for second toe amputation with debridement of the wound and complex closure.  Discussed risk benefits and alternatives as well as possible complications.  He is willing to proceed.  All questions were answered. - Appreciate ID recs, continue antibiotics per ID at dialysis - plan for extended duration abx -XR: expected post op changes -WB Status: WBAT in post op shoe  -Sutures: remain intact until OR -Medications/ABX:  Abx per ID, plan for 4 weeks abx -Dressing: Applied Betadine gauze dressing continue dressing changes at home until the surgery next week - F/u Plan: Pt to present for outpatient surgery next week Wednesday.  Discussed regarding H&P because he was recently admitted we checked with preadmission testing/preop personnel at [facility] and they cleared us  in  regards to recent H&P on admission 1 week ago would be sufficient for upcoming procedure.        [name], DPM [facility]

## 2024-05-24 ENCOUNTER — Encounter (HOSPITAL_COMMUNITY): Payer: Self-pay | Admitting: Podiatry

## 2024-05-24 ENCOUNTER — Other Ambulatory Visit (HOSPITAL_COMMUNITY): Payer: Self-pay

## 2024-05-24 ENCOUNTER — Other Ambulatory Visit: Payer: Self-pay
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
## 2024-05-24 NOTE — Anesthesia Preprocedure Evaluation (Addendum)
 Anesthesia Evaluation    History of Anesthesia Complications (+) PONV and history of anesthetic complications  Airway Mallampati: I  TM Distance: >3 FB Neck ROM: Full    Dental  (+) Dental Advisory Given   Pulmonary    breath sounds clear to auscultation       Cardiovascular hypertension, +CHF  + Valvular Problems/Murmurs AI  Rhythm:Regular Rate:Normal  RHC/LHC 02/02/2024: 1. Normal coronaries; nonischemic cardiomyopathy.  2. Elevated PCWP but normal RA pressure.  3. Mild pulmonary venous hypertension.  4. Low cardiac index, 2.1 by thermo and 1.9 by Fick.  Suspect due to valvular disease.  - Comparison 02/01/2015: NICM, reasonable RH filling pressures, no pulm HTN, given hydralazine  for severe HTN, LVEF 20-25% by 01/25/2015 TTE   TEE 10/29/2023: IMPRESSIONS  1. Left ventricular ejection fraction, by estimation, is 35%. The left  ventricle has moderately decreased function. The left ventricle  demonstrates global hypokinesis. The left ventricular internal cavity size  was mildly to moderately dilated.  2. Right ventricular systolic function is mildly reduced. The right  ventricular size is normal.  3. No left atrial/left atrial appendage thrombus was detected.  4. No PFO/ASD by color doppler.  5. There is a mobile vegetation that appears calcified on the right cusp,  0.9 cm. There is a smaller mobile vegetation on the left cusp. The right  and noncoronary cusps do not fully coapt. I do not see a definite  perforation. The aortic valve is  tricuspid. Aortic valve regurgitation is severe. No aortic stenosis is  present. Difficult to assess but there does appear to be holodiastolic  flow reversal in the proximal descending thoracic aorta.  6. The mitral valve is normal in structure. Trivial mitral valve  regurgitation. No evidence of mitral stenosis.  7. The dialysis catheter extends into the right atrium, no vegetation   noted.  8. 3D performed of the aortic valve.     Neuro/Psych    GI/Hepatic ,GERD  ,,  Endo/Other  diabetes, Type 2    Renal/GU Renal disease     Musculoskeletal   Abdominal   Peds  Hematology   Anesthesia Other Findings   Reproductive/Obstetrics                              Anesthesia Physical Anesthesia Plan  ASA: 4  Anesthesia Plan: MAC   Post-op Pain Management:    Induction: Intravenous  PONV Risk Score and Plan: 2 and Ondansetron   Airway Management Planned: Simple Face Mask  Additional Equipment:   Intra-op Plan:   Post-operative Plan:   Informed Consent:      Dental advisory given  Plan Discussed with:   Anesthesia Plan Comments: (See PAT note written 05/24/2024 by Isaiah Ruder, PA-C.  HD today via PermCath. K 4.0 on arrival to preop holding.  )         Anesthesia Quick Evaluation

## 2024-05-24 NOTE — Progress Notes (Signed)
 SDW call  Patient was given pre-op  instructions over the phone. Patient verbalized understanding of instructions provided.     PCP - Dr. Remonia D'Mello Cardiologist - Dr. Ezra Shuck Nephrologist: Riccardo, dialysis M, W, F.  States he will go to dialysis prior to surgery Pulmonary:    PPM/ICD - dnies Device Orders - na Rep Notified - na   Chest x-ray - 02/22/2024 EKG -  05/13/2024 Stress Test -01/25/2015 ECHO - 10/29/2023 Cardiac Cath - 02/02/2024  Sleep Study/sleep apnea/CPAP: denies  Type II diabetic.  A1C 6.4 on 05/11/2024 Fasting Blood sugar range: 110-well over 200 How often check sugars: daily Novolog , zero units. Take 1//2 correction dose DOS if blood sugar is >220 Semglee : take 7 units the night before surgery, which is 1/2 of his regular dose   Blood Thinner Instructions: denies Aspirin  Instructions:denies   ERAS Protcol - Clears until 0915   Anesthesia review: Yes. HTN, DM, CHF, HLD, ESRD with dialysis   Patient denies shortness of breath, fever, cough and chest pain over the phone call  Your procedure is scheduled on Wednesday September 17, 225  Report to [facility] Main Entrance A at  0945  A.M., then check in with the Admitting office.  Call this number if you have problems the morning of surgery:  [phone]   If you have any questions prior to your surgery date call [phone]: Open Monday-Friday 8am-4pm If you experience any cold or flu symptoms such as cough, fever, chills, shortness of breath, etc. between now and your scheduled surgery, please notify us  at the above number     Remember:  Do not eat after midnight the night before your surgery  You may drink clear liquids until 0915    the morning of your surgery.   Clear liquids allowed are: Water, Non-Citrus Juices (without pulp), Carbonated Beverages, Clear Tea, Black Coffee ONLY (NO MILK, CREAM OR POWDERED CREAMER of any kind), and Gatorade   Take these medicines the morning of surgery with  A SIP OF WATER:  Atorvastatin , carvedilol , hydralazine , pantoprazole   As needed: Tylenol , zofran   As of today, STOP taking any Aspirin  (unless otherwise instructed by your surgeon) Aleve, Naproxen, Ibuprofen , Motrin , Advil , Goody's, BC's, all herbal medications, fish oil, and all vitamins.

## 2024-05-25 ENCOUNTER — Other Ambulatory Visit (HOSPITAL_BASED_OUTPATIENT_CLINIC_OR_DEPARTMENT_OTHER): Payer: Self-pay

## 2024-05-25 ENCOUNTER — Ambulatory Visit: Admitting: Vascular Surgery

## 2024-05-25 ENCOUNTER — Other Ambulatory Visit (HOSPITAL_COMMUNITY): Payer: Self-pay

## 2024-05-25 ENCOUNTER — Encounter (HOSPITAL_COMMUNITY): Payer: Self-pay | Admitting: Podiatry

## 2024-05-25 ENCOUNTER — Ambulatory Visit (HOSPITAL_COMMUNITY)

## 2024-05-25 ENCOUNTER — Ambulatory Visit (HOSPITAL_COMMUNITY): Payer: Self-pay | Admitting: Vascular Surgery

## 2024-05-25 ENCOUNTER — Ambulatory Visit (HOSPITAL_COMMUNITY)
Admission: RE | Admit: 2024-05-25 | Discharge: 2024-05-25 | Disposition: A | Discharge status: Home / Self Care | Attending: Podiatry | Admitting: Podiatry

## 2024-05-25 ENCOUNTER — Encounter (HOSPITAL_COMMUNITY)
Admission: RE | Disposition: A | Discharge status: Home / Self Care | Payer: Self-pay | Source: Home / Self Care | Attending: Podiatry

## 2024-05-25 ENCOUNTER — Ambulatory Visit (HOSPITAL_COMMUNITY): Admission: RE | Admit: 2024-05-25 | Source: Ambulatory Visit

## 2024-05-25 ENCOUNTER — Ambulatory Visit (HOSPITAL_BASED_OUTPATIENT_CLINIC_OR_DEPARTMENT_OTHER): Payer: Self-pay | Admitting: Vascular Surgery

## 2024-05-25 ENCOUNTER — Other Ambulatory Visit: Payer: Self-pay

## 2024-05-25 DIAGNOSIS — N186 End stage renal disease: Secondary | ICD-10-CM | POA: Diagnosis not present

## 2024-05-25 DIAGNOSIS — M86172 Other acute osteomyelitis, left ankle and foot: Secondary | ICD-10-CM | POA: Insufficient documentation

## 2024-05-25 DIAGNOSIS — D631 Anemia in chronic kidney disease: Secondary | ICD-10-CM | POA: Diagnosis not present

## 2024-05-25 DIAGNOSIS — E1152 Type 2 diabetes mellitus with diabetic peripheral angiopathy with gangrene: Secondary | ICD-10-CM | POA: Diagnosis not present

## 2024-05-25 DIAGNOSIS — I5042 Chronic combined systolic (congestive) and diastolic (congestive) heart failure: Secondary | ICD-10-CM | POA: Insufficient documentation

## 2024-05-25 DIAGNOSIS — K219 Gastro-esophageal reflux disease without esophagitis: Secondary | ICD-10-CM | POA: Diagnosis not present

## 2024-05-25 DIAGNOSIS — E11621 Type 2 diabetes mellitus with foot ulcer: Secondary | ICD-10-CM | POA: Insufficient documentation

## 2024-05-25 DIAGNOSIS — I428 Other cardiomyopathies: Secondary | ICD-10-CM | POA: Insufficient documentation

## 2024-05-25 DIAGNOSIS — Y835 Amputation of limb(s) as the cause of abnormal reaction of the patient, or of later complication, without mention of misadventure at the time of the procedure: Secondary | ICD-10-CM | POA: Insufficient documentation

## 2024-05-25 DIAGNOSIS — E1122 Type 2 diabetes mellitus with diabetic chronic kidney disease: Secondary | ICD-10-CM | POA: Insufficient documentation

## 2024-05-25 DIAGNOSIS — Z992 Dependence on renal dialysis: Secondary | ICD-10-CM | POA: Insufficient documentation

## 2024-05-25 DIAGNOSIS — E1169 Type 2 diabetes mellitus with other specified complication: Secondary | ICD-10-CM

## 2024-05-25 DIAGNOSIS — Z794 Long term (current) use of insulin: Secondary | ICD-10-CM | POA: Diagnosis not present

## 2024-05-25 DIAGNOSIS — Z89421 Acquired absence of other right toe(s): Secondary | ICD-10-CM | POA: Diagnosis not present

## 2024-05-25 DIAGNOSIS — Z9889 Other specified postprocedural states: Secondary | ICD-10-CM | POA: Diagnosis not present

## 2024-05-25 DIAGNOSIS — I96 Gangrene, not elsewhere classified: Secondary | ICD-10-CM

## 2024-05-25 DIAGNOSIS — T8131XA Disruption of external operation (surgical) wound, not elsewhere classified, initial encounter: Secondary | ICD-10-CM | POA: Diagnosis present

## 2024-05-25 DIAGNOSIS — M869 Osteomyelitis, unspecified: Secondary | ICD-10-CM | POA: Diagnosis not present

## 2024-05-25 DIAGNOSIS — T8131XD Disruption of external operation (surgical) wound, not elsewhere classified, subsequent encounter: Secondary | ICD-10-CM

## 2024-05-25 DIAGNOSIS — L97524 Non-pressure chronic ulcer of other part of left foot with necrosis of bone: Secondary | ICD-10-CM | POA: Insufficient documentation

## 2024-05-25 DIAGNOSIS — I351 Nonrheumatic aortic (valve) insufficiency: Secondary | ICD-10-CM | POA: Insufficient documentation

## 2024-05-25 DIAGNOSIS — R600 Localized edema: Secondary | ICD-10-CM | POA: Diagnosis not present

## 2024-05-25 DIAGNOSIS — I132 Hypertensive heart and chronic kidney disease with heart failure and with stage 5 chronic kidney disease, or end stage renal disease: Secondary | ICD-10-CM | POA: Diagnosis not present

## 2024-05-25 DIAGNOSIS — Z89422 Acquired absence of other left toe(s): Secondary | ICD-10-CM | POA: Diagnosis not present

## 2024-05-25 DIAGNOSIS — M87878 Other osteonecrosis, left toe(s): Secondary | ICD-10-CM | POA: Diagnosis not present

## 2024-05-25 HISTORY — DX: Nonrheumatic aortic (valve) insufficiency: I35.1

## 2024-05-25 HISTORY — PX: AMPUTATION TOE: SHX6595

## 2024-05-25 HISTORY — PX: COMPLEX WOUND CLOSURE: SHX6446

## 2024-05-25 LAB — GLUCOSE, CAPILLARY
Glucose-Capillary: 116 mg/dL — ABNORMAL HIGH (ref 70–99)
Glucose-Capillary: 138 mg/dL — ABNORMAL HIGH (ref 70–99)
Glucose-Capillary: 124 mg/dL — ABNORMAL HIGH (ref 70–99)

## 2024-05-25 LAB — POCT I-STAT, CHEM 8
BUN: 26 mg/dL — ABNORMAL HIGH (ref 6–20)
Calcium, Ion: 1.01 mmol/L — ABNORMAL LOW (ref 1.15–1.40)
Chloride: 95 mmol/L — ABNORMAL LOW (ref 98–111)
Creatinine, Ser: 6.8 mg/dL — ABNORMAL HIGH (ref 0.61–1.24)
Glucose, Bld: 134 mg/dL — ABNORMAL HIGH (ref 70–99)
HCT: 40 % (ref 39.0–52.0)
Hemoglobin: 13.6 g/dL (ref 13.0–17.0)
Potassium: 4 mmol/L (ref 3.5–5.1)
Sodium: 137 mmol/L (ref 135–145)
TCO2: 32 mmol/L (ref 22–32)

## 2024-05-25 SURGERY — AMPUTATION, TOE
Anesthesia: Monitor Anesthesia Care | Site: Toe | Laterality: Left

## 2024-05-25 MED ORDER — AMOXICILLIN-POT CLAVULANATE 875-125 MG PO TABS
1.0000 | ORAL_TABLET | Freq: Two times a day (BID) | ORAL | 0 refills | Status: AC
  Filled 2024-05-25: qty 20, 10d supply, fill #0

## 2024-05-25 MED ORDER — FENTANYL CITRATE (PF) 100 MCG/2ML IJ SOLN: 25.0000 ug | INTRAMUSCULAR | Status: DC | PRN

## 2024-05-25 MED ORDER — PHENYLEPHRINE 80 MCG/ML (10ML) SYRINGE FOR IV PUSH (FOR BLOOD PRESSURE SUPPORT)
PREFILLED_SYRINGE | INTRAVENOUS | Status: DC | PRN
  Administered 2024-05-25: 80 ug via INTRAVENOUS

## 2024-05-25 MED ORDER — ONDANSETRON HCL 4 MG/2ML IJ SOLN: 4.0000 mg | Freq: Once | INTRAMUSCULAR | Status: DC | PRN

## 2024-05-25 MED ORDER — ORAL CARE MOUTH RINSE: 15.0000 mL | Freq: Once | OROMUCOSAL | Status: AC

## 2024-05-25 MED ORDER — LIDOCAINE HCL 1 % IJ SOLN
INTRAMUSCULAR | Status: AC
  Filled 2024-05-25: qty 20

## 2024-05-25 MED ORDER — ONDANSETRON HCL 4 MG/2ML IJ SOLN
INTRAMUSCULAR | Status: DC | PRN
  Administered 2024-05-25: 4 mg via INTRAVENOUS

## 2024-05-25 MED ORDER — OXYCODONE HCL 5 MG/5ML PO SOLN: 5.0000 mg | Freq: Once | ORAL | Status: DC | PRN

## 2024-05-25 MED ORDER — BUPIVACAINE HCL (PF) 0.5 % IJ SOLN
INTRAMUSCULAR | Status: AC
  Filled 2024-05-25: qty 30

## 2024-05-25 MED ORDER — PIPERACILLIN-TAZOBACTAM 3.375 G IVPB 30 MIN
3.3750 g | INTRAVENOUS | Status: AC
  Administered 2024-05-25: 3.375 g via INTRAVENOUS
  Filled 2024-05-25: qty 50

## 2024-05-25 MED ORDER — PHENYLEPHRINE HCL-NACL 20-0.9 MG/250ML-% IV SOLN
INTRAVENOUS | Status: DC | PRN
  Administered 2024-05-25: 15 ug/min via INTRAVENOUS

## 2024-05-25 MED ORDER — MIDAZOLAM HCL 2 MG/2ML IJ SOLN
INTRAMUSCULAR | Status: DC | PRN
  Administered 2024-05-25 (×2): 1 mg via INTRAVENOUS

## 2024-05-25 MED ORDER — LIDOCAINE HCL 1 % IJ SOLN
INTRAMUSCULAR | Status: DC | PRN
  Administered 2024-05-25: 10 mL

## 2024-05-25 MED ORDER — OXYCODONE HCL 5 MG PO TABS: 5.0000 mg | ORAL_TABLET | Freq: Once | ORAL | Status: DC | PRN

## 2024-05-25 MED ORDER — PROPOFOL 10 MG/ML IV BOLUS
INTRAVENOUS | Status: AC
  Filled 2024-05-25: qty 20

## 2024-05-25 MED ORDER — PROPOFOL 500 MG/50ML IV EMUL
INTRAVENOUS | Status: DC | PRN
  Administered 2024-05-25: 75 ug/kg/min via INTRAVENOUS

## 2024-05-25 MED ORDER — CHLORHEXIDINE GLUCONATE 0.12 % MT SOLN
OROMUCOSAL | Status: AC
  Administered 2024-05-25: 15 mL via OROMUCOSAL
  Filled 2024-05-25: qty 15

## 2024-05-25 MED ORDER — SODIUM CHLORIDE 0.9 % IV SOLN: INTRAVENOUS | Status: DC

## 2024-05-25 MED ORDER — LACTATED RINGERS IV SOLN: INTRAVENOUS | Status: DC

## 2024-05-25 MED ORDER — CHLORHEXIDINE GLUCONATE 0.12 % MT SOLN: 15.0000 mL | Freq: Once | OROMUCOSAL | Status: AC

## 2024-05-25 MED ORDER — LIDOCAINE 2% (20 MG/ML) 5 ML SYRINGE
INTRAMUSCULAR | Status: AC
  Filled 2024-05-25: qty 5

## 2024-05-25 MED ORDER — DROPERIDOL 2.5 MG/ML IJ SOLN: 0.6250 mg | Freq: Once | INTRAMUSCULAR | Status: DC | PRN

## 2024-05-25 MED ORDER — MIDAZOLAM HCL 2 MG/2ML IJ SOLN
INTRAMUSCULAR | Status: AC
  Filled 2024-05-25: qty 2

## 2024-05-25 MED ORDER — OXYCODONE-ACETAMINOPHEN 5-325 MG PO TABS
1.0000 | ORAL_TABLET | ORAL | 0 refills | Status: DC | PRN
  Filled 2024-05-25: qty 20, 4d supply, fill #0

## 2024-05-25 MED ORDER — FENTANYL CITRATE (PF) 250 MCG/5ML IJ SOLN
INTRAMUSCULAR | Status: AC
  Filled 2024-05-25: qty 5

## 2024-05-25 SURGICAL SUPPLY — 38 items
BLADE AVERAGE 25X9 (BLADE) IMPLANT
BLADE SURG 10 STRL SS (BLADE) ×2 IMPLANT
BLADE SURG 15 STRL LF DISP TIS (BLADE) ×2 IMPLANT
BNDG COHESIVE 3X5 TAN ST LF (GAUZE/BANDAGES/DRESSINGS) ×2 IMPLANT
BNDG COMPR ESMARK 4X3 LF (GAUZE/BANDAGES/DRESSINGS) ×2 IMPLANT
BNDG ELASTIC 3INX 5YD STR LF (GAUZE/BANDAGES/DRESSINGS) ×2 IMPLANT
BNDG ELASTIC 4INX 5YD STR LF (GAUZE/BANDAGES/DRESSINGS) IMPLANT
BNDG GAUZE DERMACEA FLUFF 4 (GAUZE/BANDAGES/DRESSINGS) IMPLANT
CHLORAPREP W/TINT 26 (MISCELLANEOUS) IMPLANT
DRSG ADAPTIC 3X8 NADH LF (GAUZE/BANDAGES/DRESSINGS) IMPLANT
DRSG XEROFORM 1X8 (GAUZE/BANDAGES/DRESSINGS) IMPLANT
ELECTRODE REM PT RTRN 9FT ADLT (ELECTROSURGICAL) ×2 IMPLANT
GAUZE PACKING IODOFORM 1/4X15 (PACKING) IMPLANT
GAUZE PAD ABD 8X10 STRL (GAUZE/BANDAGES/DRESSINGS) IMPLANT
GAUZE SPONGE 2X2 STRL 8-PLY (GAUZE/BANDAGES/DRESSINGS) IMPLANT
GAUZE SPONGE 4X4 12PLY STRL (GAUZE/BANDAGES/DRESSINGS) ×2 IMPLANT
GAUZE STRETCH 2X75IN STRL (MISCELLANEOUS) ×2 IMPLANT
GAUZE XEROFORM 1X8 LF (GAUZE/BANDAGES/DRESSINGS) ×2 IMPLANT
GLOVE BIO SURGEON STRL SZ7.5 (GLOVE) ×2 IMPLANT
GLOVE BIOGEL PI IND STRL 7.5 (GLOVE) ×2 IMPLANT
GOWN STRL REUS W/ TWL LRG LVL3 (GOWN DISPOSABLE) ×4 IMPLANT
KIT BASIN OR (CUSTOM PROCEDURE TRAY) ×2 IMPLANT
NDL HYPO 25X1 1.5 SAFETY (NEEDLE) ×2 IMPLANT
NEEDLE HYPO 25X1 1.5 SAFETY (NEEDLE) ×2 IMPLANT
PACK ORTHO EXTREMITY (CUSTOM PROCEDURE TRAY) ×2 IMPLANT
PADDING CAST ABS COTTON 4X4 ST (CAST SUPPLIES) ×4 IMPLANT
SET HNDPC FAN SPRY TIP SCT (DISPOSABLE) IMPLANT
SPIKE FLUID TRANSFER (MISCELLANEOUS) IMPLANT
STOCKINETTE 4X48 STRL (DRAPES) IMPLANT
SUT ETHILON 3 0 FSLX (SUTURE) IMPLANT
SUT PROLENE 2 0 FS (SUTURE) IMPLANT
SUT PROLENE 3 0 PS 2 (SUTURE) IMPLANT
SUT PROLENE 4 0 PS 2 18 (SUTURE) IMPLANT
SYR CONTROL 10ML LL (SYRINGE) ×2 IMPLANT
TUBE CONNECTING 12X1/4 (SUCTIONS) IMPLANT
UNDERPAD 30X36 HEAVY ABSORB (UNDERPADS AND DIAPERS) ×2 IMPLANT
WATER STERILE IRR 1000ML POUR (IV SOLUTION) ×2 IMPLANT
YANKAUER SUCT BULB TIP NO VENT (SUCTIONS) IMPLANT

## 2024-05-25 NOTE — Discharge Instructions (Signed)
 Foot & Ankle Surgery Patient Discharge Instructions:   Arrange to have an adult drive you home after surgery. If you had general anesthesia, it may take a day or more to fully recover. So, for at least the next 24 hours: Do not drive or use machinery or power tools; do not drink alcohol; and do not make any major decisions.   Bandages: Keep your dressings clean, dry, and intact. Do not remove or change. When bathing/showering, cover the bandage or cast with a plastic bag to keep it dry (still keep the area away from the water) and use a chair, do not stand. Don't remove your bandage until your doctor tells you to. If your bandage gets wet or dirty, check with your doctor. You can likely replace it with a clean, dry one.  Activity: Limited weightbearing in post op shoe to left foot Sit or lie down when possible. Put a pillow under your heel to raise your foot above the level of your heart. Place ice bag or pack behind knee on surgical side for 20 minutes every hour that you are awake for the first 24-48 hours. You can drive again when instructed by your doctor. Wear your surgical shoe at all times unless told otherwise by your health care provider. Use crutches or a cane as directed. Follow your doctor's instructions about putting weight on your foot.  Diet: Start with liquids and light foods (such as dry toast, bananas, and applesauce). As you feel up to it, slowly return to your normal diet. Drink at least six to eight glasses of water or other nonalcoholic fluids a day. To avoid nausea, eat before taking narcotic pain medications.  Medications: Take all medications as instructed. Take pain medications on time. Do not wait until the pain is bad before taking your medications. Avoid alcohol while on pain medications.  Call your doctor if: Continuous bleeding through dressings. If your dressings get wet. Fevers over 100.4 degrees Fahrenheit (38 degrees Celsius). Chest pains or  difficulty breathing.

## 2024-05-25 NOTE — Anesthesia Procedure Notes (Signed)
 Procedure Name: MAC Date/Time: 05/25/2024 12:45 PM  Performed by: Domnique Vanegas J, CRNAPre-anesthesia Checklist: Patient identified, Emergency Drugs available, Suction available and Patient being monitored Patient Re-evaluated:Patient Re-evaluated prior to induction Oxygen Delivery Method: Simple face mask Preoxygenation: Pre-oxygenation with 100% oxygen Induction Type: IV induction Placement Confirmation: positive ETCO2 and breath sounds checked- equal and bilateral Dental Injury: Teeth and Oropharynx as per pre-operative assessment

## 2024-05-25 NOTE — Op Note (Signed)
 Full Operative Report  Date of Operation: 1:19 PM, 05/25/2024   Patient: Jeremy [name] - 43 y.o. 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  Amputation of the left second toe at MPJ level 2.  Irrigation debridement of surgical dehiscence with complex wound closure 5 x 2.5 cm, left foot    Anesthesia: Monitor Anesthesia Care  Waddell Lauraine NOVAK, MD  Anesthesiologist: Waddell Lauraine NOVAK, MD CRNA: Carolee Lauraine DASEN, CRNA; Kufeji, Omotara J, CRNA   Estimated Blood Loss: 10 mL  Hemostasis: 1) Anatomical dissection, mechanical compression, electrocautery 2) no tourniquet was used on procedure  Implants: * No implants in log *  Materials: Prolene 2-0, Prolene 3-0  Injectables: 1) Pre-operatively: 10 cc of 50:50 mixture 1%lidocaine  plain and 0.5% marcaine  plain 2) Post-operatively: None   Specimens: - Pathology: Left second toe - Microbiology: Deep tissue culture from dehiscence site partial second ray amputation   Antibiotics: IV antibiotics given per schedule on the floor  Drains: None  Complications: Patient tolerated the procedure well without complication.   Operative findings: As below in detailed report  Indications for Procedure: Jeremy [name] presents to [facility], DPM with a chief complaint of gangrene of the left second toe after prior proximal phalanx resection from the left foot during second partial ray resection.  Unfortunately the second toe has necrosis and is nonviable as well as some dehiscence at the surgical site.  The patient has failed conservative treatments of various modalities. At this time the patient has elected to proceed with surgical correction. All alternatives, risks, and complications of the procedures were thoroughly explained to the patient. Patient exhibits appropriate understanding of all  discussion points and informed consent was signed and obtained in the chart with no guarantees to surgical outcome given or implied.  Description of Procedure: Patient was brought to the operating room. Patient remained on their hospital bed in the supine position. A surgical timeout was performed and all members of the operating room, the procedure, and the surgical site were identified. anesthesia occurred as per anesthesia record. Local anesthetic as previously described was then injected about the operative field in a local infiltrative block.  The operative lower extremity as noted above was then prepped and draped in the usual sterile manner. The following procedure then began.  Attention was directed to the 2nd digit on the left foot. A full-thickness incision encompassing the entire digit was made using a #15 blade. Dissection was carried down to bone. The toe was secured with a towel clamp, further dissected in its entirety, and disarticulated at the MPJ and passed to the back table as a gross specimen. This was then labled and sent to pathology. The bone was noted to be soft and eroded, and consistent with osteomyelitis. All remaining necrotic and devitalized soft tissue structures were visualized and dissected away using sharp and dull dissection. Care was taken to protect all neurovascular structures throughout the dissection. All bleeders were cauterized as necessary.   Attention was then directed to the dehiscence site at the prior partial second ray amputation.  There was noted to be necrotic and fibrotic tissues at the tissue margins.  A 10 blade was used to resect the tissue margins to healthy bleeding level.  The underlying tissue appeared relatively healthy with bleeding fat tissue.  Deep tissue culture was harvested with rongeur and sent  for micro.  Excisional debridement was carried out with 15 blade 10 blade and rongeur to the level of the second metatarsal resection site.  The area was  then flushed with copious amounts of sterile saline 1 L via power pulse lavage.  The total area to be closed with complex closure was measured at 5 x 2.5 cm postdebridement.  Complex closure was necessitated given the dehiscence and significant undermining and resection of excess tissue in the surgical site. then using the suture materials previously described, the site was closed in anatomic layers and the skin was well approximated under minimal tension.   The surgical site was then dressed with Betadine Adaptic 4 x 4 Kerlix and Ace wrap.  The plantar ulceration was packed with iodoform packing.. The patient tolerated both the procedure and anesthesia well with vital signs stable throughout. The patient was transferred in good condition and all vital signs stable  from the OR to recovery under the discretion of anesthesia.  Condition: Vital signs stable, neurovascular status unchanged from preoperative   Surgical plan:  Second toe is nonviable this has been amputated and the surgical site debrided of any necrotic tissues.  He is now fully closed with no open wound aside from the plantar ulceration which is healing in nicely with packing dressing changes.  Continue these every other day beginning in 2 to 3 days.  The patient will be minimal weightbearing as tolerated in a postop shoe to the operative limb until further instructed. The dressing is to remain clean, dry, and intact. Will continue to follow unless noted elsewhere.   Jeremy [name], DPM [facility]

## 2024-05-25 NOTE — Anesthesia Postprocedure Evaluation (Signed)
 Anesthesia Post Note  Patient: Corben Auzenne  Procedure(s) Performed: AMPUTATION, TOE (Left: Toe) COMPLEX CLOSURE, WOUND (Left)     Patient location during evaluation: PACU Anesthesia Type: MAC Level of consciousness: awake and alert Pain management: pain level controlled Vital Signs Assessment: post-procedure vital signs reviewed and stable Respiratory status: spontaneous breathing, nonlabored ventilation and respiratory function stable Cardiovascular status: blood pressure returned to baseline and stable Postop Assessment: no apparent nausea or vomiting Anesthetic complications: no   No notable events documented.  Last Vitals:  Vitals:   05/25/24 1330 05/25/24 1345  BP: (!) 98/53 (!) 93/48  Pulse: 76 71  Resp: 17 (!) 21  Temp:  36.9 C  SpO2: 95% 96%    Last Pain:  Vitals:   05/25/24 1345  TempSrc:   PainSc: 0-No pain                 Lauraine KATHEE [place]

## 2024-05-25 NOTE — Transfer of Care (Signed)
 Immediate Anesthesia Transfer of Care Note  Patient: Jeremy [name]  Procedure(s) Performed: AMPUTATION, TOE (Left: Toe) COMPLEX CLOSURE, WOUND (Left)  Patient Location: PACU  Anesthesia Type:MAC  Level of Consciousness: awake, alert , and oriented  Airway & Oxygen Therapy: Patient Spontanous Breathing and Patient connected to face mask oxygen  Post-op Assessment: Report given to RN and Post -op Vital signs reviewed and stable  Post vital signs: Reviewed and stable  Last Vitals:  Vitals Value Taken Time  BP 102/41 05/25/24 13:18  Temp    Pulse 75 05/25/24 13:21  Resp 28 05/25/24 13:21  SpO2 97 % 05/25/24 13:21  Vitals shown include unfiled device data.  Last Pain:  Vitals:   05/25/24 1046  TempSrc:   PainSc: 0-No pain      Patients Stated Pain Goal: 0 (05/25/24 1046)  Complications: No notable events documented.

## 2024-05-25 NOTE — Interval H&P Note (Signed)
 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  Gangrene of left 2nd toe. The various methods of treatment have been discussed with the patient and family. After consideration of risks, benefits and other options for treatment, the patient has consented to  Procedure(s) with comments: AMPUTATION, TOE (Left) - 2ND TOE COMPLEX CLOSURE, WOUND (Left) as a surgical intervention.  The patient's history has been reviewed, patient examined, no change in status, stable for surgery.  I have reviewed the patient's chart and labs.  Questions were answered to the patient's satisfaction.     Marsa FALCON [name]

## 2024-05-25 NOTE — Progress Notes (Signed)
 Orthopedic Tech Progress Note Patient Details:  Jeremy [name] 03-Oct-1980 [id number]  Patient has POST OP SHOE   Patient ID: [name], male   DOB: February 26, 1981, 43 y.o.   MRN: [id number]  Delanna LITTIE Pac 05/25/2024, 1:24 PM

## 2024-05-26 ENCOUNTER — Encounter (HOSPITAL_COMMUNITY): Payer: Self-pay | Admitting: Podiatry

## 2024-05-26 LAB — SURGICAL PATHOLOGY

## 2024-05-30 LAB — AEROBIC/ANAEROBIC CULTURE W GRAM STAIN (SURGICAL/DEEP WOUND)

## 2024-06-02 ENCOUNTER — Ambulatory Visit: Payer: Self-pay | Admitting: Podiatry

## 2024-06-02 ENCOUNTER — Encounter: Payer: Self-pay | Admitting: Podiatry

## 2024-06-02 DIAGNOSIS — L97524 Non-pressure chronic ulcer of other part of left foot with necrosis of bone: Secondary | ICD-10-CM

## 2024-06-02 DIAGNOSIS — M86172 Other acute osteomyelitis, left ankle and foot: Secondary | ICD-10-CM

## 2024-06-02 DIAGNOSIS — I96 Gangrene, not elsewhere classified: Secondary | ICD-10-CM

## 2024-06-02 DIAGNOSIS — Z9889 Other specified postprocedural states: Secondary | ICD-10-CM

## 2024-06-02 NOTE — Progress Notes (Signed)
  Subjective:  Patient ID: Jeremy [name], male    DOB: April 17, 1981,  MRN: [id number]  Chief Complaint  Patient presents with   Routine Post Op    POV # 1 DOS 05/25/24 LT 2ND TOE AMPUTATION W/ WOUND DEBRIDEMENT. 0 pain. 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 Swelling in the left foot. Taking I.V. and oral antibiotics.    DOS: 05/25/2024 Procedure: Left second toe amputation with wound debridement and complex closure  43 y.o. male seen for post op check.  Patient presenting for follow-up after above procedure approximately 1 week out.  Taking IV and oral antibiotics.  Set to finish his Augmentin  in the next day or 2.  Has been doing every other day dressing changes as instructed.  Review of Systems: Negative except as noted in the HPI. Denies N/V/F/Ch.   Objective:   Constitutional Well developed. Well nourished.  Vascular Foot warm and well perfused. Capillary refill normal to all digits.   No calf pain with palpation  Neurologic Normal speech. Oriented to person, place, and time. Epicritic sensation absent to right foot  Dermatologic Dehiscence of the proximal aspect of closure with evidence of hematoma formation.  There is macerated fibrotic tissue that is consistent with hematoma collection that was debrided. Plantar ulceration appears to be healing in with packing   Orthopedic: Status post second toe amputation prior partial second metatarsal resection prior third toe amputation   Radiographs: 1. Resection of the second toe. 2. Displaced fracture involving the lateral aspect of the first proximal phalanx involving the articular surface, not seen on preoperative imaging.  Pathology: A. TOE, LEFT SECOND, AMPUTATION:  - Toe, clinically left second, showing bone and fibroconnective tissue  with necrotizing inflammation   Micro:   RARE ESCHERICHIA COLI Confirmed Extended Spectrum Beta-Lactamase Producer (ESBL).  In bloodstream infections from ESBL organisms, carbapenems are preferred over  piperacillin /tazobactam. They are shown to have a lower risk of mortality. NO ANAEROBES ISOLATED    Assessment:   1. Gangrene of toe of left foot (HCC)   2. 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 Acute osteomyelitis of left ankle or foot (HCC)   4. Post-operative state     Plan:  Patient was evaluated and treated and all questions answered.  POD # 7 s/p left second toe amputation with complex wound closure -Progressing unfortunately with dehiscence of the closure site on the left foot.  Distally the amputation site looks healthy however proximally there is hematoma formation and dehiscence. - Debrided the proximal aspect of the dehisced area to subcutaneous fat tissue and remove some of the hematoma.  No significant purulence identified.  No malodor -Continue with daily dressing change at this time to the left foot with Betadine.  Continue packing the plantar ulceration. -XR: Concern for fracture of the proximal lateral base of the proximal phalanx of the left hallux this could represent traumatic fracture versus pathologic fracture secondary to local infection and osteomyelitis -WB Status: Weightbearing as tolerated in postop shoe -Sutures: Removed today. -Medications/ABX: Continue antibiotics per infectious disease -Dressing: Daily Betadine and dry gauze dressing to the dorsal wound and continue packing the plantar ulceration with 1 inch iodoform packing - F/u Plan: Patient to follow-up in 1 week - He remains at high risk for needing conversion to transmetatarsal amputation especially if erosion or osteolysis is noted about the left hallux proximal phalanx fracture indicating osteomyelitis.        [name], DPM [facility] / [facility]

## 2024-06-03 ENCOUNTER — Encounter: Payer: Self-pay | Admitting: Podiatry

## 2024-06-07 ENCOUNTER — Inpatient Hospital Stay: Payer: Self-pay | Admitting: Internal Medicine

## 2024-06-09 ENCOUNTER — Other Ambulatory Visit (HOSPITAL_COMMUNITY): Payer: Self-pay

## 2024-06-09 ENCOUNTER — Ambulatory Visit (INDEPENDENT_AMBULATORY_CARE_PROVIDER_SITE_OTHER): Admitting: Podiatry

## 2024-06-09 DIAGNOSIS — Z9889 Other specified postprocedural states: Secondary | ICD-10-CM

## 2024-06-09 DIAGNOSIS — M86172 Other acute osteomyelitis, left ankle and foot: Secondary | ICD-10-CM

## 2024-06-09 DIAGNOSIS — I96 Gangrene, not elsewhere classified: Secondary | ICD-10-CM

## 2024-06-09 DIAGNOSIS — L97524 Non-pressure chronic ulcer of other part of left foot with necrosis of bone: Secondary | ICD-10-CM

## 2024-06-09 MED ORDER — AMOXICILLIN-POT CLAVULANATE 875-125 MG PO TABS
1.0000 | ORAL_TABLET | Freq: Two times a day (BID) | ORAL | 0 refills | Status: AC
  Filled 2024-06-09: qty 20, 10d supply, fill #0

## 2024-06-09 NOTE — Progress Notes (Signed)
  Subjective:  Patient ID: Jeremy [name], male    DOB: 1980/11/25,  MRN: [id number]  Chief Complaint  Patient presents with   Wound Check    POV # 2 DOS 05/25/24 LT 2ND TOE AMPUTATION W/ WOUND DEBRIDEMENT. 0 pain. Iodine dressings. Wearing surgical shoe. 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    DOS: 05/25/2024 Procedure: Left second toe amputation with wound debridement and complex closure  43 y.o. male seen for post op check.  Now 2 weeks out.  Patient reports he thinks the wound is doing better today.  Has been wearing surgical shoe and doing daily iodine dressings.  Review of Systems: Negative except as noted in the HPI. Denies N/V/F/Ch.   Objective:   Constitutional Well developed. Well nourished.  Vascular Foot warm and well perfused. Capillary refill normal to all digits.   No calf pain with palpation  Neurologic Normal speech. Oriented to person, place, and time. Epicritic sensation absent to right foot  Dermatologic Dehiscence of the proximal aspect of closure though appears improved with local wound care and prior debridement.  Overall the area of dehiscence appears smaller today and with healthier tissue surrounding.  Decreased maceration no malodor. Plantar ulceration appears to be healing in with packing   Orthopedic: Status post second toe amputation prior partial second metatarsal resection prior third toe amputation   Radiographs: 1. Resection of the second toe. 2. Displaced fracture involving the lateral aspect of the first proximal phalanx involving the articular surface, not seen on preoperative imaging.  Pathology: A. TOE, LEFT SECOND, AMPUTATION:  - Toe, clinically left second, showing bone and fibroconnective tissue  with necrotizing inflammation   Micro:   RARE ESCHERICHIA COLI Confirmed Extended Spectrum Beta-Lactamase Producer (ESBL).  In bloodstream infections from ESBL organisms, carbapenems are preferred over piperacillin /tazobactam. They are shown to have a lower risk  of mortality. NO ANAEROBES ISOLATED    Assessment:   1. Gangrene of toe of left foot (HCC)   2. 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 Acute osteomyelitis of left ankle or foot (HCC)   4. Post-operative state      Plan:  Patient was evaluated and treated and all questions answered.  2 weeks s/p left second toe amputation with complex wound closure -Progressing with improvement in the dorsal wound.  Still with hematoma formation though overall appears improved from prior.  Will continue with current plan - Debrided the proximal aspect of the dehisced area to subcutaneous fat tissue and remove some of the hematoma.  No significant purulence identified.  No malodor -Continue with daily dressing change at this time to the left foot with Betadine.  Continue packing the plantar ulceration. -XR: Concern for fracture of the proximal lateral base of the proximal phalanx of the left hallux this could represent traumatic fracture versus pathologic fracture secondary to local infection and osteomyelitis -WB Status: Weightbearing as tolerated in postop shoe -Sutures: Previously removed -Medications/ABX: Continue antibiotics per infectious disease -will send refill of Augmentin  for 10 days -Dressing: Daily Betadine and dry gauze dressing to the dorsal wound and continue packing the plantar ulceration with 1 inch iodoform packing - F/u Plan: Patient to follow-up in 1 week         [name], DPM [facility] / [facility]

## 2024-06-11 ENCOUNTER — Other Ambulatory Visit (HOSPITAL_COMMUNITY): Payer: Self-pay

## 2024-06-11 ENCOUNTER — Encounter (HOSPITAL_COMMUNITY): Payer: Self-pay | Admitting: *Deleted

## 2024-06-16 ENCOUNTER — Ambulatory Visit (INDEPENDENT_AMBULATORY_CARE_PROVIDER_SITE_OTHER): Admitting: Podiatry

## 2024-06-16 ENCOUNTER — Encounter: Payer: Self-pay | Admitting: Podiatry

## 2024-06-16 DIAGNOSIS — M86172 Other acute osteomyelitis, left ankle and foot: Secondary | ICD-10-CM

## 2024-06-16 DIAGNOSIS — I96 Gangrene, not elsewhere classified: Secondary | ICD-10-CM

## 2024-06-16 DIAGNOSIS — Z9889 Other specified postprocedural states: Secondary | ICD-10-CM

## 2024-06-16 DIAGNOSIS — L97524 Non-pressure chronic ulcer of other part of left foot with necrosis of bone: Secondary | ICD-10-CM

## 2024-06-16 NOTE — Progress Notes (Signed)
 Su\bjective:  Patient ID: Jeremy [name], male    DOB: 1980-11-05,  MRN: [id number]  Chief Complaint  Patient presents with   Wound Check    POV #3 DOS 05/25/24 LT 2ND AND 3RD TOE AMPUTATION W/ WOUND DEBRIDEMENT OF SECOND TOE. Plantar ulcer. Iodine dressing and iodoform packing. O pain. Wearing surgical shoe and taking Augmentin  875. 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    DOS: 05/25/2024 Procedure: Left second toe amputation with wound debridement and complex closure  43 y.o. male seen for post op check.  Now 3 weeks out.  Patient reports minimal drainage from the wounds has been wearing surgical shoe and doing daily dressing changes with Betadine and gauze.  Has been packing the plantar wound.  Review of Systems: Negative except as noted in the HPI. Denies N/V/F/Ch.   Objective:   Constitutional Well developed. Well nourished.  Vascular Foot warm and well perfused. Capillary refill normal to all digits.   No calf pain with palpation  Neurologic Normal speech. Oriented to person, place, and time. Epicritic sensation absent to right foot  Dermatologic Dehiscence of the proximal aspect of closure though appears improved with local wound care and prior debridement.  Overall the area of dehiscence appears smaller today and with healthier tissue surrounding.  Decreased maceration no malodor. Plantar ulceration appears to be healing in with packing   Orthopedic: Status post second toe amputation prior partial second metatarsal resection prior third toe amputation   Radiographs: 1. Resection of the second toe. 2. Displaced fracture involving the lateral aspect of the first proximal phalanx involving the articular surface, not seen on preoperative imaging.  Pathology: A. TOE, LEFT SECOND, AMPUTATION:  - Toe, clinically left second, showing bone and fibroconnective tissue  with necrotizing inflammation   Micro:   RARE ESCHERICHIA COLI Confirmed Extended Spectrum Beta-Lactamase Producer (ESBL).  In  bloodstream infections from ESBL organisms, carbapenems are preferred over piperacillin /tazobactam. They are shown to have a lower risk of mortality. NO ANAEROBES ISOLATED    Assessment:   1. Gangrene of toe of left foot (HCC)   2. 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 Acute osteomyelitis of left ankle or foot (HCC)   4. Post-operative state       Plan:  Patient was evaluated and treated and all questions answered.  3 weeks s/p left second toe amputation with complex wound closure -Progressing with improvement in the dorsal wound.  Much improved today with decreased wound size dorsally seems to be healing and with hematoma resolving. - Debrided the proximal aspect of the dehisced area to subcutaneous fat tissue and remove some of the hematoma.  No significant purulence identified.  No malodor -Continue with daily dressing change at this time to the left foot with saline moist to dry gauze dressing.  Okay to discontinue packing the plantar ulceration which has limited depth at this point in time -XR: Concern for fracture of the proximal lateral base of the proximal phalanx of the left hallux this could represent traumatic fracture versus pathologic fracture secondary to local infection and osteomyelitis -WB Status: Weightbearing as tolerated in postop shoe -Sutures: Previously removed -Medications/ABX: Continue antibiotics per infectious disease, will monitor off any further antibiotics at this time from my perspective -Dressing: Daily  saline moist to dry gauze dressing to the dorsal and plantar wound.  Will order wound care supplies including silver alginate which she can apply as a contact layer - F/u Plan: Patient to follow-up in 2 week  [name], DPM [facility] / [facility]

## 2024-06-22 ENCOUNTER — Inpatient Hospital Stay: Admitting: Internal Medicine

## 2024-06-23 ENCOUNTER — Other Ambulatory Visit: Payer: Self-pay | Admitting: Podiatry

## 2024-06-23 ENCOUNTER — Other Ambulatory Visit (HOSPITAL_COMMUNITY): Payer: Self-pay

## 2024-06-23 ENCOUNTER — Encounter: Payer: Self-pay | Admitting: Podiatry

## 2024-06-23 MED ORDER — OXYCODONE-ACETAMINOPHEN 5-325 MG PO TABS
1.0000 | ORAL_TABLET | ORAL | 0 refills | Status: AC | PRN
  Filled 2024-06-23: qty 20, 4d supply, fill #0

## 2024-06-24 ENCOUNTER — Other Ambulatory Visit: Payer: Self-pay

## 2024-06-30 ENCOUNTER — Ambulatory Visit: Admitting: Podiatry

## 2024-06-30 ENCOUNTER — Encounter: Payer: Self-pay | Admitting: Podiatry

## 2024-06-30 DIAGNOSIS — M86172 Other acute osteomyelitis, left ankle and foot: Secondary | ICD-10-CM

## 2024-06-30 DIAGNOSIS — I96 Gangrene, not elsewhere classified: Secondary | ICD-10-CM

## 2024-06-30 DIAGNOSIS — Z9889 Other specified postprocedural states: Secondary | ICD-10-CM

## 2024-06-30 DIAGNOSIS — L97524 Non-pressure chronic ulcer of other part of left foot with necrosis of bone: Secondary | ICD-10-CM

## 2024-06-30 NOTE — Progress Notes (Signed)
 Su\bjective:  Patient ID: Jeremy [name], male    DOB: 1981/07/17,  MRN: [id number]  Chief Complaint  Patient presents with   Wound Check    2 wk f/u left second toe amputation.3 pain. A1C 6.4. Pt. States wound doing and looking better.Saline dressings.    DOS: 05/25/2024 Procedure: Left second toe amputation with wound debridement and complex closure  43 y.o. male seen for post op check.  Now 5 weeks out.  Patient reports minimal drainage from the wounds has been wearing surgical shoe and doing daily dressing changes with saline moist to dry gauze dressings.  Reports both wounds have been improving.  He finished antibiotics recently.  Follow-up with infectious diseases next week.  Does report some swelling in the right great toe  Review of Systems: Negative except as noted in the HPI. Denies N/V/F/Ch.   Objective:   Constitutional Well developed. Well nourished.  Vascular Foot warm and well perfused. Capillary refill normal to all digits.   No calf pain with palpation  Neurologic Normal speech. Oriented to person, place, and time. Epicritic sensation absent to right foot  Dermatologic Dehiscence of the proximal aspect of closure though appears improved with local wound care and prior debridement.  Overall the area of dehiscence appears smaller today and with healthier tissue surrounding.  Decreased maceration no malodor. Plantar ulceration  healing in superficial at this time      Orthopedic: Status post second toe amputation prior partial second metatarsal resection prior third toe amputation  Edema noted of the right great toe though no significant erythema and no open wounds   Radiographs: 1. Resection of the second toe. 2. Displaced fracture involving the lateral aspect of the first proximal phalanx involving the articular surface, not seen on preoperative imaging.  Pathology: A. TOE, LEFT SECOND, AMPUTATION:  - Toe, clinically left second, showing bone and  fibroconnective tissue  with necrotizing inflammation   Micro:   RARE ESCHERICHIA COLI Confirmed Extended Spectrum Beta-Lactamase Producer (ESBL).  In bloodstream infections from ESBL organisms, carbapenems are preferred over piperacillin /tazobactam. They are shown to have a lower risk of mortality. NO ANAEROBES ISOLATED    Assessment:   1. Gangrene of toe of left foot (HCC)   2. 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 Acute osteomyelitis of left ankle or foot (HCC)   4. Post-operative state        Plan:  Patient was evaluated and treated and all questions answered.  5 weeks s/p left second toe amputation with complex wound closure -Progressing with improvement in the dorsal wound.  Much improved today with decreased wound size dorsally seems to be healing and with hematoma resolving. - Debrided the proximal aspect of the dehisced area to subcutaneous fat tissue and remove some of the hematoma.  No significant purulence identified.  No malodor -Continue with daily dressing change at this time to the left foot with saline moist to dry gauze dressing.  Okay to discontinue packing the plantar ulceration which has limited depth at this point in time -XR: Concern for fracture of the proximal lateral base of the proximal phalanx of the left hallux this could represent traumatic fracture versus pathologic fracture secondary to local infection and osteomyelitis -WB Status: Weightbearing as tolerated in postop shoe -Sutures: Previously removed -Medications/ABX: Continue antibiotics per infectious disease, will monitor off any further antibiotics at this time from my perspective -Dressing: Daily  saline moist to dry gauze dressing to the dorsal and plantar wound.  Will order wound care supplies including silver alginate which she can apply as a contact layer - F/u Plan: Patient to follow-up in 3 week         Jeremy [name], DPM [facility] / [facility]

## 2024-07-05 ENCOUNTER — Encounter: Payer: Self-pay | Admitting: Internal Medicine

## 2024-07-05 ENCOUNTER — Other Ambulatory Visit: Payer: Self-pay

## 2024-07-05 ENCOUNTER — Ambulatory Visit: Admitting: Internal Medicine

## 2024-07-05 VITALS — BP 131/74 | HR 91 | Wt 253.8 lb

## 2024-07-05 DIAGNOSIS — A499 Bacterial infection, unspecified: Secondary | ICD-10-CM

## 2024-07-05 DIAGNOSIS — L089 Local infection of the skin and subcutaneous tissue, unspecified: Secondary | ICD-10-CM

## 2024-07-05 DIAGNOSIS — E11628 Type 2 diabetes mellitus with other skin complications: Secondary | ICD-10-CM

## 2024-07-05 DIAGNOSIS — M869 Osteomyelitis, unspecified: Secondary | ICD-10-CM

## 2024-07-05 DIAGNOSIS — Z1612 Extended spectrum beta lactamase (ESBL) resistance: Secondary | ICD-10-CM | POA: Diagnosis not present

## 2024-07-05 DIAGNOSIS — B9620 Unspecified Escherichia coli [E. coli] as the cause of diseases classified elsewhere: Secondary | ICD-10-CM

## 2024-07-05 NOTE — Patient Instructions (Signed)
 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 Some dialysis center can't arrange this and will need picc line for daily ertapenem    I'll forward chart to you podiatrist, nephrologist and our clinical team to help arrange this   For now no antibiotics by mouth (no options)    Follow up with me in 5-6 weeks

## 2024-07-05 NOTE — Progress Notes (Signed)
 [facility]  Reason for Consult:diabetic foot infection Referring Provider: Standiford    Patient Active Problem List   Diagnosis Date Noted   Gangrene of toe of left foot (HCC) 05/25/2024   Diabetic foot ulcer (HCC) 05/11/2024   Groin pain 02/09/2024   Acute bacterial endocarditis 10/28/2023   Flash pulmonary edema (HCC) 10/26/2023   Wound dehiscence, surgical 10/26/2023   Hypertensive urgency 10/26/2023   Acute hypoxic respiratory failure (HCC) 10/26/2023   Diabetes mellitus with ESRD (end-stage renal disease) (HCC) 10/26/2023   Acute pulmonary edema (HCC) 10/26/2023   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) 10/26/2023   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 10/15/2023   Shortness of breath 10/15/2023   Lone post-op atrial fibrillation (HCC), resolved 09/06/2023   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) 09/05/2023   Diabetic foot infection (HCC) 09/04/2023   Type 2 diabetes mellitus with chronic kidney disease on chronic dialysis, with long-term current use of insulin  (HCC) 09/04/2023   Abscess of left foot 09/04/2023   Osteomyelitis (HCC) 09/04/2023   Caries 07/23/2022   Periodontal disease 07/23/2022   Loose, teeth 07/23/2022   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   Sepsis without acute organ dysfunction (HCC) 07/22/2022   HF with recovered EF 07/21/2022   Bacteremia due to Enterococcus 07/21/2022   ESRD (end stage renal disease) (HCC) 07/20/2022   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   Aortic valve insufficiency 07/18/2022   Catheter-related bloodstream infection 07/16/2022   Anemia, unspecified 10/26/2019   Coagulation defect, unspecified 10/21/2019   Secondary hyperparathyroidism of renal origin 10/21/2019   Mass of soft tissue of face 09/23/2018   Onychomycosis due to dermatophyte 03/29/2018   Anemia, iron  deficiency 07/18/2016   Anemia in chronic kidney disease 07/18/2016   Chronic  kidney disease with active medical management without dialysis, stage 5 (HCC) 05/29/2016   Scrotal abscess 11/21/2015   Suspected sleep apnea 05/24/2015   Chronic combined systolic and diastolic CHF (congestive heart failure) (HCC) 03/01/2015   Nonischemic cardiomyopathy (HCC)    Asthma    Hyperlipidemia 03/31/2012   Obesity, Class I, BMI 30-34.9 03/21/2011   Primary hypertension 03/21/2011   Gastroesophageal reflux disease 03/21/2011   Type 2 diabetes mellitus with diabetic nephropathy (HCC) 02/20/2011      HPI: Jeremy [name] is a 43 y.o. male esrd, dm2 s/p 05/25/24 left second toe amputation referred here for comanagement  I reviewed chart I saw him 05/13/2024 - he was admitted for left foot diabetic infection with 2nd toe om and abscess on mri  He undergone partial 2nd ray on 9/4 with 2nd toe mpj debridement and resection proximal phalanx Cx grew proteus and gbs and bacteroides and ecoli (ecoli cipro  and bactrim  resistant and esbl)  Abx given vanc/ceftaz to be given during hemodialysis The ecoli grew much later prior to discharging with ceftazidime   He underwent I&D again on 9/17 by podiatry -- cx gpc that didn't grow but ecoli continued to grow same pattern susceptibility as 05/12/24  Reviewed 9/17 operative note Amputation left second toe at mpj level and I&D of wound Id not consulted during this. He was continued on ceftaz/vancomycin  and given also augmentin   No f/c, n/v/d/rash   Review of Systems: ROS All other ros negative       Past Medical History:  Diagnosis Date   Abscess of left groin    Acute blood loss anemia 11/11/2013   Anemia in  chronic kidney disease (CKD)    Aortic regurgitation    Aortic valve endocarditis 07/2022   Asthma    Boil of scrotum 11/21/2015   Chest pain    a. 01/2015 Lexiscan  MV: EF 28%, inferior, inferolateral, apical ischemia;  b. 01/2015 Cath: nl cors, PCWP 18 mmHg, CO 9.38 L/min, CI 3.53 L/min/m^2.   CHF (congestive heart failure)  (HCC)    Depression    Situational   ESRD (end stage renal disease) (HCC)    [facility]- [place]   Essential hypertension    Family history of adverse reaction to anesthesia    sister- it was too much for her heart died   Flash pulmonary edema (HCC) 09/18/2022   GERD (gastroesophageal reflux disease)    Hyperlipidemia    Membranous glomerulonephritis    Morbid obesity (HCC)    Nonischemic cardiomyopathy (HCC)    a. 01/2015 Echo: EF 20-25%, diff HK, Gr 2 DD, Triv AI, mildly dil LA and Ao root.   PONV (postoperative nausea and vomiting)    Seizures (HCC) 02/29/2020   electralytes were out of wack.   Type II diabetes mellitus (HCC)    a. 01/2015 HbA1c = 8.9.    Social History   Tobacco Use   Smoking status: Never   Smokeless tobacco: Never  Vaping Use   Vaping status: Never Used  Substance Use Topics   Alcohol use: No    Alcohol/week: 0.0 standard drinks of alcohol   Drug use: Not Currently    Types: Marijuana    Comment: last used 09/15/2020    Family History  Problem Relation Age of Onset   Diabetes Mellitus II Mother        died @ 4.   Gastric cancer Mother    CAD Father        died @ 103.   Heart attack Father    Congestive Heart Failure Father    Diabetes Mellitus II Sister    CAD Sister        s/p PCI - age 63.    Allergies  Allergen Reactions   Acyclovir And Related Hives   Imdur  [Isosorbide  Nitrate] Other (See Comments)    Headaches from nitrate    OBJECTIVE: Vitals:   07/05/24 1608  BP: 131/74  Pulse: 91  SpO2: 99%  Weight: 253 lb 12.8 oz (115.1 kg)   Body mass index is 30.1 kg/m.   Physical Exam General/constitutional: no distress, pleasant HEENT: Normocephalic, PER, Conj Clear, EOMI, Oropharynx clear Neck supple CV: rrr no mrg Lungs: clear to auscultation, normal respiratory effort Abd: Soft, Nontender Ext: no edema Skin/msk: Improving wound      Lab:  Microbiology:  Serology:  Imaging:   Assessment/plan: Problem  List Items Addressed This Visit     Diabetic foot infection (HCC) - Primary (Chronic)   Osteomyelitis (HCC)   Other Visit Diagnoses       ESBL (extended spectrum beta-lactamase) producing bacteria infection          Diabetic foot infection polymicrobial; ecoli is esbl -- proteus/groub b strep  The proteus and group b strep had been well treated The ecoli hasn't been treated fully I suspect the wound is healing due to surgical debridement on 9/17  I don't know if the 2nd mt end is involved now and it would be prudent to treat 4 weeks with appropriate antibiotics despite how the external wound is looking    Unfortunately there is no PO abx for ecoli   Would do  ertapenem with dialysis for 4 weeks three times per week If unable to do this by the dialysis center will need picc line and daily ertapenem 500 mg daily     The opat orders below follow only one (with dialysis or without; the first one is with; the second set is wihtout)   OPAT Orders Discharge antibiotics to be given via hd catheter Discharge antibiotics: Ertapenem 1 gram three times a week with dialysis   Duration: 4 weeks End Date: 08/04/24  [facility] Care Per Protocol:  Home health RN for IV administration and teaching; PICC line care and labs.    Labs weekly while on IV antibiotics: _x_ CBC with differential __ BMP _x_ CMP _x_ CRP __ ESR __ Vancomycin  trough __ CK  Fax weekly labs to [phone]         OPAT Orders Discharge antibiotics to be given via PICC line Discharge antibiotics: Ertapenem 500 mg daily; on dialysis days given after dialysis  Duration: 4 weeks End Date: 11/27  [facility] Care Per Protocol:  Home health RN for IV administration and teaching; PICC line care and labs.    Labs weekly while on IV antibiotics: _x_ CBC with differential __ BMP _x_ CMP _x_ CRP __ ESR __ Vancomycin  trough __ CK  _x_ Please pull PIC at completion of IV antibiotics __ Please leave PIC  in place until doctor has seen patient or been notified  Fax weekly labs to [phone]     Follow-up: Return in about 2 weeks (around 07/19/2024).    I have spent a total of 55 minutes of face-to-face and non-face-to-face time, excluding clinical staff time, preparing to see patient, ordering tests and/or medications, and provide counseling the patient    Constance ONEIDA Passer, MD [facility] 07/05/2024, 4:38 PM

## 2024-07-06 ENCOUNTER — Telehealth: Payer: Self-pay

## 2024-07-06 NOTE — Telephone Encounter (Signed)
 I spoke with [facility] with the dialysis center [facility] she stated she received a Prior Authorization form from Dr. Macel for the patient IV Ertapenem.  The form will need to be filled out  before they can start the patient on the medication, since this is not a medication that they have at dialysis.  Ethelene stated the authorization can take up to 2 weeks to be approved. Per Dr. Overton the patient is ok to wait 2 weeks. Ethelene will reach out to our office once this is approved.  Patient aware as well. I will also fax opat order to the dialysis center.  Layli Capshaw ONEIDA Ligas, CMA

## 2024-07-08 DIAGNOSIS — E1129 Type 2 diabetes mellitus with other diabetic kidney complication: Secondary | ICD-10-CM | POA: Diagnosis not present

## 2024-07-08 DIAGNOSIS — Z992 Dependence on renal dialysis: Secondary | ICD-10-CM | POA: Diagnosis not present

## 2024-07-08 DIAGNOSIS — N186 End stage renal disease: Secondary | ICD-10-CM | POA: Diagnosis not present

## 2024-07-08 NOTE — Telephone Encounter (Signed)
 Per Dr. Peeples, ertapenem has been approved and they are waiting for it to be shipped to HD unit.   Dacian Orrico, BSN, RN

## 2024-07-13 ENCOUNTER — Ambulatory Visit (HOSPITAL_COMMUNITY): Admission: RE | Admit: 2024-07-13 | Source: Ambulatory Visit

## 2024-07-13 ENCOUNTER — Ambulatory Visit: Admitting: Vascular Surgery

## 2024-07-13 ENCOUNTER — Ambulatory Visit (HOSPITAL_COMMUNITY): Attending: Vascular Surgery

## 2024-07-19 ENCOUNTER — Ambulatory Visit: Admitting: Internal Medicine

## 2024-07-21 ENCOUNTER — Encounter: Payer: Self-pay | Admitting: Podiatry

## 2024-07-21 ENCOUNTER — Ambulatory Visit: Admitting: Podiatry

## 2024-07-21 ENCOUNTER — Other Ambulatory Visit (HOSPITAL_COMMUNITY): Payer: Self-pay

## 2024-07-21 ENCOUNTER — Other Ambulatory Visit: Payer: Self-pay

## 2024-07-21 ENCOUNTER — Ambulatory Visit (INDEPENDENT_AMBULATORY_CARE_PROVIDER_SITE_OTHER): Payer: Self-pay | Admitting: Internal Medicine

## 2024-07-21 DIAGNOSIS — I96 Gangrene, not elsewhere classified: Secondary | ICD-10-CM

## 2024-07-21 DIAGNOSIS — M86172 Other acute osteomyelitis, left ankle and foot: Secondary | ICD-10-CM

## 2024-07-21 DIAGNOSIS — Z9889 Other specified postprocedural states: Secondary | ICD-10-CM

## 2024-07-21 DIAGNOSIS — M869 Osteomyelitis, unspecified: Secondary | ICD-10-CM

## 2024-07-21 NOTE — Progress Notes (Signed)
  Su\bjective:  Patient ID: Jeremy [name], male    DOB: 13-Jun-1981,  MRN: [id number]  Chief Complaint  Patient presents with   Routine Post Op     Left second toe amputation with wound debridement and complex closure. 10 pain. Hit foot on bed post. Wearing surgical shoe.    DOS: 05/25/2024 Procedure: Left second toe amputation with wound debridement and complex closure  43 y.o. male seen for post op check.  Now 8 weeks out.   Has been on additional antibiotics via IV at dialysis per infectious disease.  He is going to see Dr. Overton after this appointment.  Says the wounds are getting smaller and has not seen any drainage.  Wearing surgical shoe.  Review of Systems: Negative except as noted in the HPI. Denies N/V/F/Ch.   Objective:   Constitutional Well developed. Well nourished.  Vascular Foot warm and well perfused. Capillary refill normal to all digits.   No calf pain with palpation  Neurologic Normal speech. Oriented to person, place, and time. Epicritic sensation absent to right foot  Dermatologic Dorsal incision wound is nearly fully healed and no evidence of drainage there.  Hyperkeratotic and eschar surrounding area.  On the plantar wound is also fully healed and at this time with some hyperkeratotic tissue      Orthopedic: Status post second toe amputation prior partial second metatarsal resection prior third toe amputation  Edema noted of the right great toe though no significant erythema and no open wounds   Radiographs: 1. Resection of the second toe. 2. Displaced fracture involving the lateral aspect of the first proximal phalanx involving the articular surface, not seen on preoperative imaging.  Pathology: A. TOE, LEFT SECOND, AMPUTATION:  - Toe, clinically left second, showing bone and fibroconnective tissue  with necrotizing inflammation   Micro:   RARE ESCHERICHIA COLI Confirmed Extended Spectrum Beta-Lactamase Producer (ESBL).  In bloodstream infections  from ESBL organisms, carbapenems are preferred over piperacillin /tazobactam. They are shown to have a lower risk of mortality. NO ANAEROBES ISOLATED    Assessment:   1. Gangrene of toe of left foot (HCC)   2. Acute osteomyelitis of left ankle or foot (HCC)   3. Post-operative state         Plan:  Patient was evaluated and treated and all questions answered.  8 weeks s/p left second toe amputation with complex wound closure -Progressing with improvement in the dorsal wound.  Nearly fully healed at the dorsal incision and the plantar ulceration also appears fully healed at this time - Debrided the hyperkeratotic tissue and eschar from the dorsal and plantar wounds. -Continue with adhesive bandage dressing to both areas as needed okay to wash the foot at this time -XR: Deferred today -WB Status: Weightbearing as tolerated in postop shoe, discussed he is okay to transition back to regular shoe gear -Sutures: Previously removed -Medications/ABX: Continue antibiotics per infectious disease, appreciate recommendations -Dressing: Okay to wash the foot at this time with warm soapy water and then dry and reapply adhesive style bandage to both areas - F/u Plan: Patient to follow-up in 4 weeks         [name], DPM [facility]

## 2024-07-22 NOTE — Progress Notes (Signed)
 No showed

## 2024-07-26 ENCOUNTER — Other Ambulatory Visit (HOSPITAL_COMMUNITY): Payer: Self-pay

## 2024-07-28 DIAGNOSIS — N2581 Secondary hyperparathyroidism of renal origin: Secondary | ICD-10-CM | POA: Diagnosis not present

## 2024-07-28 DIAGNOSIS — Z992 Dependence on renal dialysis: Secondary | ICD-10-CM | POA: Diagnosis not present

## 2024-07-28 DIAGNOSIS — N186 End stage renal disease: Secondary | ICD-10-CM | POA: Diagnosis not present

## 2024-08-07 DIAGNOSIS — Z992 Dependence on renal dialysis: Secondary | ICD-10-CM | POA: Diagnosis not present

## 2024-08-07 DIAGNOSIS — N186 End stage renal disease: Secondary | ICD-10-CM | POA: Diagnosis not present

## 2024-08-07 DIAGNOSIS — E1129 Type 2 diabetes mellitus with other diabetic kidney complication: Secondary | ICD-10-CM | POA: Diagnosis not present

## 2024-08-08 DIAGNOSIS — T8249XA Other complication of vascular dialysis catheter, initial encounter: Secondary | ICD-10-CM | POA: Diagnosis not present

## 2024-08-08 DIAGNOSIS — Z992 Dependence on renal dialysis: Secondary | ICD-10-CM | POA: Diagnosis not present

## 2024-08-08 DIAGNOSIS — N186 End stage renal disease: Secondary | ICD-10-CM | POA: Diagnosis not present

## 2024-08-08 DIAGNOSIS — R7881 Bacteremia: Secondary | ICD-10-CM | POA: Diagnosis not present

## 2024-08-08 DIAGNOSIS — N2581 Secondary hyperparathyroidism of renal origin: Secondary | ICD-10-CM | POA: Diagnosis not present

## 2024-08-08 DIAGNOSIS — D689 Coagulation defect, unspecified: Secondary | ICD-10-CM | POA: Diagnosis not present

## 2024-08-09 ENCOUNTER — Ambulatory Visit: Admitting: Internal Medicine

## 2024-08-09 ENCOUNTER — Encounter: Payer: Self-pay | Admitting: Internal Medicine

## 2024-08-09 ENCOUNTER — Other Ambulatory Visit: Payer: Self-pay

## 2024-08-09 VITALS — BP 109/63 | HR 63 | Temp 98.1°F | Ht 77.0 in | Wt 254.0 lb

## 2024-08-09 DIAGNOSIS — Z1612 Extended spectrum beta lactamase (ESBL) resistance: Secondary | ICD-10-CM | POA: Diagnosis not present

## 2024-08-09 DIAGNOSIS — L089 Local infection of the skin and subcutaneous tissue, unspecified: Secondary | ICD-10-CM

## 2024-08-09 DIAGNOSIS — Z89422 Acquired absence of other left toe(s): Secondary | ICD-10-CM

## 2024-08-09 DIAGNOSIS — M869 Osteomyelitis, unspecified: Secondary | ICD-10-CM

## 2024-08-09 DIAGNOSIS — E11621 Type 2 diabetes mellitus with foot ulcer: Secondary | ICD-10-CM

## 2024-08-09 NOTE — Patient Instructions (Signed)
 For the next 2-3 months monitor for sign of foot infection (redness, pain swelling pus, fever, chill) If occurs let me and your podiatrist know  Check in around 6-8 weeks with me video visit is fine to see how you do off antibiotics

## 2024-08-09 NOTE — Progress Notes (Signed)
 [facility]  Reason for Consult:diabetic foot infection Referring Provider: Standiford    Patient Active Problem List   Diagnosis Date Noted   Gangrene of toe of left foot (HCC) 05/25/2024   Diabetic foot ulcer (HCC) 05/11/2024   Groin pain 02/09/2024   Acute bacterial endocarditis 10/28/2023   Flash pulmonary edema (HCC) 10/26/2023   Wound dehiscence, surgical 10/26/2023   Hypertensive urgency 10/26/2023   Acute hypoxic respiratory failure (HCC) 10/26/2023   Diabetes mellitus with ESRD (end-stage renal disease) (HCC) 10/26/2023   Acute pulmonary edema (HCC) 10/26/2023   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) 10/26/2023   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 10/15/2023   Shortness of breath 10/15/2023   Lone post-op atrial fibrillation (HCC), resolved 09/06/2023   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) 09/05/2023   Diabetic foot infection (HCC) 09/04/2023   Type 2 diabetes mellitus with chronic kidney disease on chronic dialysis, with long-term current use of insulin  (HCC) 09/04/2023   Abscess of left foot 09/04/2023   Osteomyelitis (HCC) 09/04/2023   Caries 07/23/2022   Periodontal disease 07/23/2022   Loose, teeth 07/23/2022   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   Sepsis without acute organ dysfunction (HCC) 07/22/2022   HF with recovered EF 07/21/2022   Bacteremia due to Enterococcus 07/21/2022   ESRD (end stage renal disease) (HCC) 07/20/2022   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   Aortic valve insufficiency 07/18/2022   Catheter-related bloodstream infection 07/16/2022   Anemia, unspecified 10/26/2019   Coagulation defect, unspecified 10/21/2019   Secondary hyperparathyroidism of renal origin 10/21/2019   Mass of soft tissue of face 09/23/2018   Onychomycosis due to dermatophyte 03/29/2018   Anemia, iron  deficiency 07/18/2016   Anemia in chronic kidney disease 07/18/2016   Chronic  kidney disease with active medical management without dialysis, stage 5 (HCC) 05/29/2016   Scrotal abscess 11/21/2015   Suspected sleep apnea 05/24/2015   Chronic combined systolic and diastolic CHF (congestive heart failure) (HCC) 03/01/2015   Nonischemic cardiomyopathy (HCC)    Asthma    Hyperlipidemia 03/31/2012   Obesity, Class I, BMI 30-34.9 03/21/2011   Primary hypertension 03/21/2011   Gastroesophageal reflux disease 03/21/2011   Type 2 diabetes mellitus with diabetic nephropathy (HCC) 02/20/2011      HPI: Jeremy [name] is a 43 y.o. male esrd, dm2 s/p 05/25/24 left second toe amputation referred here for comanagement  I reviewed chart I saw him 05/13/2024 - he was admitted for left foot diabetic infection with 2nd toe om and abscess on mri  He undergone partial 2nd ray on 9/4 with 2nd toe mpj debridement and resection proximal phalanx Cx grew proteus and gbs and bacteroides and ecoli (ecoli cipro  and bactrim  resistant and esbl)  Abx given vanc/ceftaz to be given during hemodialysis The ecoli grew much later prior to discharging with ceftazidime   He underwent I&D again on 9/17 by podiatry -- cx gpc that didn't grow but ecoli continued to grow same pattern susceptibility as 05/12/24  Reviewed 9/17 operative note Amputation left second toe at mpj level and I&D of wound Id not consulted during this. He was continued on ceftaz/vancomycin  and given also augmentin   No f/c, n/v/d/rash   ------------ 08/09/24 id clinic f/u 11/13 saw podiatry -- had left 2nd toe I&D and partial ray on 05/25/24. Had esbl and so we started on ertapenem for 4 weeks.  No complaint with the foot. 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 01/2015 Lexiscan  MV: EF 28%, inferior, inferolateral, apical ischemia;  b. 01/2015 Cath: nl cors, PCWP 18 mmHg, CO 9.38 L/min, CI 3.53 L/min/m^2.   CHF (congestive heart failure) (HCC)    Depression    Situational   ESRD (end stage renal disease) (HCC)    [facility]S- [place]   Essential hypertension    Family history of adverse reaction to anesthesia    sister- it was too much for her heart died   Flash pulmonary edema (HCC) 09/18/2022   GERD (gastroesophageal reflux disease)    Hyperlipidemia    Membranous glomerulonephritis    Morbid obesity (HCC)    Nonischemic cardiomyopathy (HCC)    a. 01/2015 Echo: EF 20-25%, diff HK, Gr 2 DD, Triv AI, mildly dil LA and Ao root.   PONV (postoperative nausea and vomiting)    Seizures (HCC) 02/29/2020   electralytes were out of wack.   Type II diabetes mellitus (HCC)    a. 01/2015 HbA1c = 8.9.    Social History   Tobacco Use   Smoking status: Never   Smokeless tobacco: Never  Vaping Use   Vaping status: Never Used  Substance Use Topics   Alcohol use: No    Alcohol/week: 0.0 standard drinks of alcohol   Drug use: Not Currently    Types: Marijuana    Comment: last used 09/15/2020    Family History  Problem Relation Age of Onset   Diabetes Mellitus II Mother        died @ 81.   Gastric cancer Mother    CAD Father        died @ 55.   Heart attack Father    Congestive Heart Failure Father    Diabetes Mellitus II Sister    CAD Sister        s/p PCI - age 72.    Allergies  Allergen Reactions   Acyclovir And Related Hives   Imdur  [Isosorbide  Nitrate] Other (See Comments)    Headaches from nitrate    OBJECTIVE: Vitals:   08/09/24 1345  BP: 109/63  Pulse: 63  Temp: 98.1 F (36.7 C)  TempSrc: Oral  SpO2: 96%  Weight: 254 lb (115.2 kg)  Height: 6' 5 (1.956 m)   Body mass index is 30.12 kg/m.   Physical Exam General/constitutional: no distress, pleasant HEENT:  Normocephalic, PER, Conj Clear, EOMI, Oropharynx clear Neck supple CV: rrr no mrg Lungs: clear to auscultation, normal respiratory effort Abd: Soft, Nontender Ext: no edema Skin/msk: No swelling, tenderness, warmth. Incision no dehiscence     Lab:  Microbiology:  Serology:  Imaging:   Assessment/plan: Problem List Items Addressed This Visit     Diabetic foot infection (HCC) (Chronic)   Osteomyelitis (HCC) - Primary    Diabetic foot infection polymicrobial; ecoli is esbl -- proteus/groub b strep  The proteus and group b strep had been well treated The ecoli hasn't been treated fully I suspect the wound is healing due to surgical debridement on 9/17  I don't know if the 2nd mt end  is involved now and it would be prudent to treat 4 weeks with appropriate antibiotics despite how the external wound is looking    Unfortunately there is no PO abx for ecoli   Would do ertapenem with dialysis for 4 weeks three times per week If unable to do this by the dialysis center will need picc line and daily ertapenem 500 mg daily     The opat orders below follow only one (with dialysis or without; the first one is with; the second set is wihtout)   OPAT Orders Discharge antibiotics to be given via hd catheter Discharge antibiotics: Ertapenem 1 gram three times a week with dialysis   Duration: 4 weeks End Date: 08/04/24  [facility] Care Per Protocol:  Home health RN for IV administration and teaching; PICC line care and labs.    Labs weekly while on IV antibiotics: _x_ CBC with differential __ BMP _x_ CMP _x_ CRP __ ESR __ Vancomycin  trough __ CK  Fax weekly labs to [phone]   -------------- 09/01/24 id assessment Finish 4 weeks ertapenem 08/08/24 Foot appears well healed and no sign of active infection Esrd and won't check lab as that might be confounding Monitor for sign of infection next 4-8 weeks and f/u 6 weeks with me F/u podiatry for ongoing  diabetic foot care         OPAT Orders Discharge antibiotics to be given via PICC line Discharge antibiotics: Ertapenem 500 mg daily; on dialysis days given after dialysis  Duration: 4 weeks End Date: 11/27  [facility] Care Per Protocol:  Home health RN for IV administration and teaching; PICC line care and labs.    Labs weekly while on IV antibiotics: _x_ CBC with differential __ BMP _x_ CMP _x_ CRP __ ESR __ Vancomycin  trough __ CK  _x_ Please pull PIC at completion of IV antibiotics __ Please leave PIC in place until doctor has seen patient or been notified  Fax weekly labs to [phone]     Follow-up: Return in about 8 weeks (around 10/04/2024).     Constance ONEIDA Passer, MD [facility] 08/09/2024, 2:21 PM

## 2024-08-10 DIAGNOSIS — D689 Coagulation defect, unspecified: Secondary | ICD-10-CM | POA: Diagnosis not present

## 2024-08-10 DIAGNOSIS — R7881 Bacteremia: Secondary | ICD-10-CM | POA: Diagnosis not present

## 2024-08-10 DIAGNOSIS — N186 End stage renal disease: Secondary | ICD-10-CM | POA: Diagnosis not present

## 2024-08-10 DIAGNOSIS — N2581 Secondary hyperparathyroidism of renal origin: Secondary | ICD-10-CM | POA: Diagnosis not present

## 2024-08-10 DIAGNOSIS — Z992 Dependence on renal dialysis: Secondary | ICD-10-CM | POA: Diagnosis not present

## 2024-08-10 DIAGNOSIS — T8249XA Other complication of vascular dialysis catheter, initial encounter: Secondary | ICD-10-CM | POA: Diagnosis not present

## 2024-08-12 DIAGNOSIS — N186 End stage renal disease: Secondary | ICD-10-CM | POA: Diagnosis not present

## 2024-08-12 DIAGNOSIS — D689 Coagulation defect, unspecified: Secondary | ICD-10-CM | POA: Diagnosis not present

## 2024-08-12 DIAGNOSIS — R7881 Bacteremia: Secondary | ICD-10-CM | POA: Diagnosis not present

## 2024-08-12 DIAGNOSIS — T8249XA Other complication of vascular dialysis catheter, initial encounter: Secondary | ICD-10-CM | POA: Diagnosis not present

## 2024-08-12 DIAGNOSIS — N2581 Secondary hyperparathyroidism of renal origin: Secondary | ICD-10-CM | POA: Diagnosis not present

## 2024-08-12 DIAGNOSIS — Z992 Dependence on renal dialysis: Secondary | ICD-10-CM | POA: Diagnosis not present

## 2024-08-18 ENCOUNTER — Ambulatory Visit (INDEPENDENT_AMBULATORY_CARE_PROVIDER_SITE_OTHER): Admitting: Podiatry

## 2024-08-18 DIAGNOSIS — Z91199 Patient's noncompliance with other medical treatment and regimen due to unspecified reason: Secondary | ICD-10-CM

## 2024-08-18 NOTE — Progress Notes (Signed)
 NS

## 2024-08-23 ENCOUNTER — Other Ambulatory Visit (HOSPITAL_COMMUNITY): Payer: Self-pay

## 2024-08-23 ENCOUNTER — Ambulatory Visit: Admitting: Podiatry

## 2024-08-23 NOTE — Progress Notes (Signed)
 NS

## 2024-08-24 ENCOUNTER — Other Ambulatory Visit (HOSPITAL_COMMUNITY): Payer: Self-pay

## 2024-09-17 ENCOUNTER — Other Ambulatory Visit (HOSPITAL_COMMUNITY): Payer: Self-pay

## 2024-09-26 ENCOUNTER — Encounter (HOSPITAL_COMMUNITY): Payer: Self-pay | Admitting: *Deleted

## 2024-10-04 ENCOUNTER — Ambulatory Visit: Admitting: Internal Medicine

## 2024-10-11 ENCOUNTER — Other Ambulatory Visit (HOSPITAL_COMMUNITY): Payer: Self-pay

## 2024-10-13 ENCOUNTER — Ambulatory Visit: Admitting: Internal Medicine
